# Patient Record
Sex: Male | Born: 1950 | Race: White | Hispanic: No | Marital: Married | Smoking: Former smoker
Health system: Southern US, Community
[De-identification: ages and names within clinical notes are randomized; demographics above are authoritative.]

## PROBLEM LIST (undated history)

## (undated) DIAGNOSIS — M549 Dorsalgia, unspecified: Secondary | ICD-10-CM

## (undated) DIAGNOSIS — N200 Calculus of kidney: Secondary | ICD-10-CM

## (undated) DIAGNOSIS — Z95810 Presence of automatic (implantable) cardiac defibrillator: Secondary | ICD-10-CM

## (undated) DIAGNOSIS — Z950 Presence of cardiac pacemaker: Secondary | ICD-10-CM

## (undated) DIAGNOSIS — D45 Polycythemia vera: Secondary | ICD-10-CM

## (undated) DIAGNOSIS — I429 Cardiomyopathy, unspecified: Secondary | ICD-10-CM

## (undated) DIAGNOSIS — Q549 Hypospadias, unspecified: Secondary | ICD-10-CM

## (undated) DIAGNOSIS — M1990 Unspecified osteoarthritis, unspecified site: Secondary | ICD-10-CM

## (undated) DIAGNOSIS — M109 Gout, unspecified: Secondary | ICD-10-CM

## (undated) DIAGNOSIS — E119 Type 2 diabetes mellitus without complications: Secondary | ICD-10-CM

## (undated) DIAGNOSIS — Z87442 Personal history of urinary calculi: Secondary | ICD-10-CM

## (undated) DIAGNOSIS — I509 Heart failure, unspecified: Secondary | ICD-10-CM

## (undated) DIAGNOSIS — I10 Essential (primary) hypertension: Secondary | ICD-10-CM

## (undated) DIAGNOSIS — I441 Atrioventricular block, second degree: Secondary | ICD-10-CM

## (undated) HISTORY — DX: Gout, unspecified: M10.9

## (undated) HISTORY — PX: TONSILLECTOMY: SUR1361

## (undated) HISTORY — PX: PILONIDAL CYST EXCISION: SHX744

## (undated) HISTORY — DX: Polycythemia vera: D45

## (undated) HISTORY — DX: Cardiomyopathy, unspecified: I42.9

## (undated) HISTORY — DX: Atrioventricular block, second degree: I44.1

## (undated) HISTORY — PX: CARDIAC SURGERY: SHX584

## (undated) HISTORY — DX: Calculus of kidney: N20.0

## (undated) HISTORY — DX: Hemochromatosis, unspecified: E83.119

## (undated) HISTORY — PX: POSTERIOR LAMINECTOMY / DECOMPRESSION LUMBAR SPINE: SUR740

## (undated) HISTORY — DX: Dorsalgia, unspecified: M54.9

## (undated) HISTORY — PX: ANKLE SURGERY: SHX546

## (undated) HISTORY — DX: Essential (primary) hypertension: I10

---
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
## 2010-04-11 ENCOUNTER — Ambulatory Visit: Payer: Self-pay | Admitting: Hematology & Oncology

## 2010-04-18 LAB — CBC WITH DIFFERENTIAL (CANCER CENTER ONLY)
HCT: 58.8 % — ABNORMAL HIGH (ref 38.7–49.9)
HGB: 19.1 g/dL — ABNORMAL HIGH (ref 13.0–17.1)
MCH: 32 pg (ref 28.0–33.4)
MCHC: 32.5 g/dL (ref 32.0–35.9)
MCV: 99 fL — ABNORMAL HIGH (ref 82–98)
Platelets: 610 10*3/uL — ABNORMAL HIGH (ref 145–400)
RBC: 5.96 10*6/uL — ABNORMAL HIGH (ref 4.20–5.70)
RDW: 11.3 % (ref 10.5–14.6)
WBC: 11.3 10*3/uL — ABNORMAL HIGH (ref 4.0–10.0)

## 2010-04-18 LAB — MANUAL DIFFERENTIAL (CHCC SATELLITE)
ALC: 2.8 10*3/uL (ref 0.9–3.3)
ANC (CHCC HP manual diff): 7.7 10*3/uL — ABNORMAL HIGH (ref 1.5–6.5)
BASO: 2 % (ref 0–2)
Eos: 6 % (ref 0–7)
LYMPH: 25 % (ref 14–48)
MONO: 7 % (ref 0–13)
PLT EST ~~LOC~~: INCREASED
RBC Comments: NORMAL
SEG: 68 % (ref 40–75)

## 2010-04-18 LAB — CHCC SATELLITE - SMEAR

## 2010-04-22 LAB — COMPREHENSIVE METABOLIC PANEL
ALT: 40 U/L (ref 0–53)
AST: 32 U/L (ref 0–37)
Albumin: 4.3 g/dL (ref 3.5–5.2)
Alkaline Phosphatase: 88 U/L (ref 39–117)
BUN: 18 mg/dL (ref 6–23)
CO2: 21 mEq/L (ref 19–32)
Calcium: 9.9 mg/dL (ref 8.4–10.5)
Chloride: 105 mEq/L (ref 96–112)
Creatinine, Ser: 1 mg/dL (ref 0.40–1.50)
Glucose, Bld: 90 mg/dL (ref 70–99)
Potassium: 4.6 mEq/L (ref 3.5–5.3)
Sodium: 136 mEq/L (ref 135–145)
Total Bilirubin: 0.5 mg/dL (ref 0.3–1.2)
Total Protein: 7 g/dL (ref 6.0–8.3)

## 2010-04-22 LAB — JAK2 GENOTYPR: JAK2 GenotypR: DETECTED

## 2010-04-22 LAB — VITAMIN B12: Vitamin B-12: 434 pg/mL (ref 211–911)

## 2010-04-22 LAB — URIC ACID: Uric Acid, Serum: 3.8 mg/dL — ABNORMAL LOW (ref 4.0–7.8)

## 2010-04-22 LAB — LACTATE DEHYDROGENASE: LDH: 193 U/L (ref 94–250)

## 2010-04-22 LAB — FERRITIN: Ferritin: 30 ng/mL (ref 22–322)

## 2010-04-22 LAB — ERYTHROPOIETIN: Erythropoietin: 6.4 m[IU]/mL (ref 2.6–34.0)

## 2010-04-25 LAB — CBC WITH DIFFERENTIAL (CANCER CENTER ONLY)
HCT: 56.7 % — ABNORMAL HIGH (ref 38.7–49.9)
HGB: 18.8 g/dL — ABNORMAL HIGH (ref 13.0–17.1)
MCH: 32.4 pg (ref 28.0–33.4)
MCHC: 33.1 g/dL (ref 32.0–35.9)
MCV: 98 fL (ref 82–98)
Platelets: 530 10*3/uL — ABNORMAL HIGH (ref 145–400)
RBC: 5.79 10*6/uL — ABNORMAL HIGH (ref 4.20–5.70)
RDW: 11.6 % (ref 10.5–14.6)
WBC: 10.3 10*3/uL — ABNORMAL HIGH (ref 4.0–10.0)

## 2010-04-25 LAB — MANUAL DIFFERENTIAL (CHCC SATELLITE)
ALC: 3.7 10*3/uL — ABNORMAL HIGH (ref 0.9–3.3)
ANC (CHCC HP manual diff): 5.8 10*3/uL (ref 1.5–6.5)
BASO: 1 % (ref 0–2)
Eos: 3 % (ref 0–7)
LYMPH: 36 % (ref 14–48)
MONO: 4 % (ref 0–13)
PLT EST ~~LOC~~: INCREASED
RBC Comments: NORMAL
SEG: 56 % (ref 40–75)

## 2010-05-03 LAB — CBC WITH DIFFERENTIAL (CANCER CENTER ONLY)
BASO#: 0.2 10*3/uL (ref 0.0–0.2)
BASO%: 1.8 % (ref 0.0–2.0)
EOS%: 6 % (ref 0.0–7.0)
Eosinophils Absolute: 0.6 10*3/uL — ABNORMAL HIGH (ref 0.0–0.5)
HCT: 53.4 % — ABNORMAL HIGH (ref 38.7–49.9)
HGB: 17.9 g/dL — ABNORMAL HIGH (ref 13.0–17.1)
LYMPH#: 2.9 10*3/uL (ref 0.9–3.3)
LYMPH%: 31 % (ref 14.0–48.0)
MCH: 32.7 pg (ref 28.0–33.4)
MCHC: 33.5 g/dL (ref 32.0–35.9)
MCV: 98 fL (ref 82–98)
MONO#: 0.8 10*3/uL (ref 0.1–0.9)
MONO%: 8.3 % (ref 0.0–13.0)
NEUT#: 5 10*3/uL (ref 1.5–6.5)
NEUT%: 52.9 % (ref 40.0–80.0)
Platelets: 574 10*3/uL — ABNORMAL HIGH (ref 145–400)
RBC: 5.47 10*6/uL (ref 4.20–5.70)
RDW: 11.2 % (ref 10.5–14.6)
WBC: 9.5 10*3/uL (ref 4.0–10.0)

## 2010-05-10 ENCOUNTER — Ambulatory Visit (HOSPITAL_BASED_OUTPATIENT_CLINIC_OR_DEPARTMENT_OTHER): Payer: 59 | Admitting: Hematology & Oncology

## 2010-05-10 LAB — CHCC SATELLITE - SMEAR

## 2010-05-10 LAB — CBC WITH DIFFERENTIAL (CANCER CENTER ONLY)
BASO#: 0.2 10*3/uL (ref 0.0–0.2)
BASO%: 2.2 % — ABNORMAL HIGH (ref 0.0–2.0)
EOS%: 4.7 % (ref 0.0–7.0)
Eosinophils Absolute: 0.5 10*3/uL (ref 0.0–0.5)
HCT: 52.9 % — ABNORMAL HIGH (ref 38.7–49.9)
HGB: 17.3 g/dL — ABNORMAL HIGH (ref 13.0–17.1)
LYMPH#: 3 10*3/uL (ref 0.9–3.3)
LYMPH%: 28.9 % (ref 14.0–48.0)
MCH: 31.7 pg (ref 28.0–33.4)
MCHC: 32.7 g/dL (ref 32.0–35.9)
MCV: 97 fL (ref 82–98)
MONO#: 0.7 10*3/uL (ref 0.1–0.9)
MONO%: 6.5 % (ref 0.0–13.0)
NEUT#: 5.9 10*3/uL (ref 1.5–6.5)
NEUT%: 57.7 % (ref 40.0–80.0)
Platelets: 661 10*3/uL — ABNORMAL HIGH (ref 145–400)
RBC: 5.47 10*6/uL (ref 4.20–5.70)
RDW: 11.2 % (ref 10.5–14.6)
WBC: 10.2 10*3/uL — ABNORMAL HIGH (ref 4.0–10.0)

## 2010-05-10 LAB — FERRITIN: Ferritin: 17 ng/mL — ABNORMAL LOW (ref 22–322)

## 2010-05-23 LAB — CBC WITH DIFFERENTIAL (CANCER CENTER ONLY)
BASO#: 0.1 10*3/uL (ref 0.0–0.2)
BASO%: 0.9 % (ref 0.0–2.0)
EOS%: 4.4 % (ref 0.0–7.0)
Eosinophils Absolute: 0.4 10*3/uL (ref 0.0–0.5)
HCT: 49.1 % (ref 38.7–49.9)
HGB: 16.1 g/dL (ref 13.0–17.1)
LYMPH#: 2.5 10*3/uL (ref 0.9–3.3)
LYMPH%: 27 % (ref 14.0–48.0)
MCH: 30.8 pg (ref 28.0–33.4)
MCHC: 32.7 g/dL (ref 32.0–35.9)
MCV: 94 fL (ref 82–98)
MONO#: 0.6 10*3/uL (ref 0.1–0.9)
MONO%: 5.9 % (ref 0.0–13.0)
NEUT#: 5.8 10*3/uL (ref 1.5–6.5)
NEUT%: 61.8 % (ref 40.0–80.0)
Platelets: 676 10*3/uL — ABNORMAL HIGH (ref 145–400)
RBC: 5.21 10*6/uL (ref 4.20–5.70)
RDW: 11.7 % (ref 10.5–14.6)
WBC: 9.4 10*3/uL (ref 4.0–10.0)

## 2010-05-23 LAB — TECHNOLOGIST REVIEW CHCC SATELLITE

## 2010-05-23 LAB — FERRITIN: Ferritin: 10 ng/mL — ABNORMAL LOW (ref 22–322)

## 2010-06-06 LAB — CBC WITH DIFFERENTIAL (CANCER CENTER ONLY)
BASO#: 0.1 10*3/uL (ref 0.0–0.2)
BASO%: 1.1 % (ref 0.0–2.0)
EOS%: 4.8 % (ref 0.0–7.0)
Eosinophils Absolute: 0.6 10*3/uL — ABNORMAL HIGH (ref 0.0–0.5)
HCT: 44.9 % (ref 38.7–49.9)
HGB: 14.7 g/dL (ref 13.0–17.1)
LYMPH#: 3.1 10*3/uL (ref 0.9–3.3)
LYMPH%: 26.1 % (ref 14.0–48.0)
MCH: 29.9 pg (ref 28.0–33.4)
MCHC: 32.8 g/dL (ref 32.0–35.9)
MCV: 91 fL (ref 82–98)
MONO#: 0.9 10*3/uL (ref 0.1–0.9)
MONO%: 7.4 % (ref 0.0–13.0)
NEUT#: 7.2 10*3/uL — ABNORMAL HIGH (ref 1.5–6.5)
NEUT%: 60.6 % (ref 40.0–80.0)
Platelets: 695 10*3/uL — ABNORMAL HIGH (ref 145–400)
RBC: 4.92 10*6/uL (ref 4.20–5.70)
RDW: 11.8 % (ref 10.5–14.6)
WBC: 11.9 10*3/uL — ABNORMAL HIGH (ref 4.0–10.0)

## 2010-06-06 LAB — TECHNOLOGIST REVIEW CHCC SATELLITE

## 2010-06-06 LAB — FERRITIN: Ferritin: 9 ng/mL — ABNORMAL LOW (ref 22–322)

## 2010-06-10 ENCOUNTER — Encounter: Payer: Self-pay | Admitting: Hematology & Oncology

## 2010-06-10 DIAGNOSIS — D45 Polycythemia vera: Secondary | ICD-10-CM

## 2010-06-10 LAB — CBC WITH DIFFERENTIAL (CANCER CENTER ONLY)
BASO#: 0.1 10*3/uL (ref 0.0–0.2)
BASO%: 0.7 % (ref 0.0–2.0)
EOS%: 3.7 % (ref 0.0–7.0)
Eosinophils Absolute: 0.3 10*3/uL (ref 0.0–0.5)
HCT: 42 % (ref 38.7–49.9)
HGB: 13.7 g/dL (ref 13.0–17.1)
LYMPH#: 3.1 10*3/uL (ref 0.9–3.3)
LYMPH%: 33.2 % (ref 14.0–48.0)
MCH: 29.7 pg (ref 28.0–33.4)
MCHC: 32.8 g/dL (ref 32.0–35.9)
MCV: 91 fL (ref 82–98)
MONO#: 1.1 10*3/uL — ABNORMAL HIGH (ref 0.1–0.9)
MONO%: 11.5 % (ref 0.0–13.0)
NEUT#: 4.7 10*3/uL (ref 1.5–6.5)
NEUT%: 50.9 % (ref 40.0–80.0)
Platelets: 680 10*3/uL — ABNORMAL HIGH (ref 145–400)
RBC: 4.63 10*6/uL (ref 4.20–5.70)
RDW: 11.2 % (ref 10.5–14.6)
WBC: 9.3 10*3/uL (ref 4.0–10.0)

## 2010-06-24 ENCOUNTER — Encounter (HOSPITAL_BASED_OUTPATIENT_CLINIC_OR_DEPARTMENT_OTHER): Payer: 59 | Admitting: Hematology & Oncology

## 2010-06-24 ENCOUNTER — Other Ambulatory Visit: Payer: Self-pay | Admitting: Family

## 2010-06-24 DIAGNOSIS — I10 Essential (primary) hypertension: Secondary | ICD-10-CM

## 2010-06-24 DIAGNOSIS — D45 Polycythemia vera: Secondary | ICD-10-CM

## 2010-06-24 LAB — CBC WITH DIFFERENTIAL (CANCER CENTER ONLY)
BASO#: 0.1 10*3/uL (ref 0.0–0.2)
BASO%: 0.9 % (ref 0.0–2.0)
EOS%: 3.4 % (ref 0.0–7.0)
Eosinophils Absolute: 0.3 10*3/uL (ref 0.0–0.5)
HCT: 41.6 % (ref 38.7–49.9)
HGB: 13.7 g/dL (ref 13.0–17.1)
LYMPH#: 2.7 10*3/uL (ref 0.9–3.3)
LYMPH%: 34.4 % (ref 14.0–48.0)
MCH: 29.6 pg (ref 28.0–33.4)
MCHC: 32.9 g/dL (ref 32.0–35.9)
MCV: 90 fL (ref 82–98)
MONO#: 0.9 10*3/uL (ref 0.1–0.9)
MONO%: 11 % (ref 0.0–13.0)
NEUT#: 3.9 10*3/uL (ref 1.5–6.5)
NEUT%: 50.3 % (ref 40.0–80.0)
Platelets: 336 10*3/uL (ref 145–400)
RBC: 4.62 10*6/uL (ref 4.20–5.70)
RDW: 11.8 % (ref 10.5–14.6)
WBC: 7.7 10*3/uL (ref 4.0–10.0)

## 2010-06-24 LAB — FERRITIN: Ferritin: 10 ng/mL — ABNORMAL LOW (ref 22–322)

## 2010-07-14 ENCOUNTER — Other Ambulatory Visit: Payer: Self-pay | Admitting: Hematology & Oncology

## 2010-07-14 ENCOUNTER — Encounter (HOSPITAL_BASED_OUTPATIENT_CLINIC_OR_DEPARTMENT_OTHER): Payer: 59 | Admitting: Hematology & Oncology

## 2010-07-14 DIAGNOSIS — R209 Unspecified disturbances of skin sensation: Secondary | ICD-10-CM

## 2010-07-14 DIAGNOSIS — I10 Essential (primary) hypertension: Secondary | ICD-10-CM

## 2010-07-14 DIAGNOSIS — D45 Polycythemia vera: Secondary | ICD-10-CM

## 2010-07-14 LAB — CBC WITH DIFFERENTIAL (CANCER CENTER ONLY)
BASO#: 0.2 10*3/uL (ref 0.0–0.2)
BASO%: 2.4 % — ABNORMAL HIGH (ref 0.0–2.0)
EOS%: 2.2 % (ref 0.0–7.0)
Eosinophils Absolute: 0.2 10*3/uL (ref 0.0–0.5)
HCT: 41.7 % (ref 38.7–49.9)
HGB: 13.4 g/dL (ref 13.0–17.1)
LYMPH#: 2.9 10*3/uL (ref 0.9–3.3)
LYMPH%: 31.7 % (ref 14.0–48.0)
MCH: 28.3 pg (ref 28.0–33.4)
MCHC: 32.1 g/dL (ref 32.0–35.9)
MCV: 88 fL (ref 82–98)
MONO#: 1.4 10*3/uL — ABNORMAL HIGH (ref 0.1–0.9)
MONO%: 15.4 % — ABNORMAL HIGH (ref 0.0–13.0)
NEUT#: 4.4 10*3/uL (ref 1.5–6.5)
NEUT%: 48.3 % (ref 40.0–80.0)
Platelets: 538 10*3/uL — ABNORMAL HIGH (ref 145–400)
RBC: 4.73 10*6/uL (ref 4.20–5.70)
RDW: 15.5 % (ref 11.1–15.7)
WBC: 9.1 10*3/uL (ref 4.0–10.0)

## 2010-08-12 ENCOUNTER — Encounter (HOSPITAL_BASED_OUTPATIENT_CLINIC_OR_DEPARTMENT_OTHER): Payer: 59 | Admitting: Hematology & Oncology

## 2010-08-12 ENCOUNTER — Other Ambulatory Visit: Payer: Self-pay | Admitting: Family

## 2010-08-12 DIAGNOSIS — D45 Polycythemia vera: Secondary | ICD-10-CM

## 2010-08-12 DIAGNOSIS — R209 Unspecified disturbances of skin sensation: Secondary | ICD-10-CM

## 2010-08-12 DIAGNOSIS — I10 Essential (primary) hypertension: Secondary | ICD-10-CM

## 2010-08-12 DIAGNOSIS — Z7982 Long term (current) use of aspirin: Secondary | ICD-10-CM

## 2010-08-12 LAB — CBC WITH DIFFERENTIAL (CANCER CENTER ONLY)
BASO#: 0.2 10*3/uL (ref 0.0–0.2)
BASO%: 2.2 % — ABNORMAL HIGH (ref 0.0–2.0)
EOS%: 2.6 % (ref 0.0–7.0)
Eosinophils Absolute: 0.2 10*3/uL (ref 0.0–0.5)
HCT: 44.5 % (ref 38.7–49.9)
HGB: 14.5 g/dL (ref 13.0–17.1)
LYMPH#: 2.4 10*3/uL (ref 0.9–3.3)
LYMPH%: 32 % (ref 14.0–48.0)
MCH: 28.8 pg (ref 28.0–33.4)
MCHC: 32.6 g/dL (ref 32.0–35.9)
MCV: 88 fL (ref 82–98)
MONO#: 0.6 10*3/uL (ref 0.1–0.9)
MONO%: 8.6 % (ref 0.0–13.0)
NEUT#: 4.1 10*3/uL (ref 1.5–6.5)
NEUT%: 54.6 % (ref 40.0–80.0)
Platelets: 405 10*3/uL — ABNORMAL HIGH (ref 145–400)
RBC: 5.04 10*6/uL (ref 4.20–5.70)
RDW: 20.6 % — ABNORMAL HIGH (ref 11.1–15.7)
WBC: 7.4 10*3/uL (ref 4.0–10.0)

## 2010-09-16 ENCOUNTER — Other Ambulatory Visit: Payer: Self-pay | Admitting: Hematology & Oncology

## 2010-09-16 ENCOUNTER — Encounter (HOSPITAL_BASED_OUTPATIENT_CLINIC_OR_DEPARTMENT_OTHER): Payer: 59 | Admitting: Hematology & Oncology

## 2010-09-16 DIAGNOSIS — R209 Unspecified disturbances of skin sensation: Secondary | ICD-10-CM

## 2010-09-16 DIAGNOSIS — Z7982 Long term (current) use of aspirin: Secondary | ICD-10-CM

## 2010-09-16 DIAGNOSIS — D45 Polycythemia vera: Secondary | ICD-10-CM

## 2010-09-16 DIAGNOSIS — I10 Essential (primary) hypertension: Secondary | ICD-10-CM

## 2010-09-16 LAB — CHCC SATELLITE - SMEAR

## 2010-09-16 LAB — CBC WITH DIFFERENTIAL (CANCER CENTER ONLY)
BASO#: 0.1 10*3/uL (ref 0.0–0.2)
BASO%: 0.7 % (ref 0.0–2.0)
EOS%: 2.6 % (ref 0.0–7.0)
Eosinophils Absolute: 0.2 10*3/uL (ref 0.0–0.5)
HCT: 41 % (ref 38.7–49.9)
HGB: 13.5 g/dL (ref 13.0–17.1)
LYMPH#: 2.5 10*3/uL (ref 0.9–3.3)
LYMPH%: 37.1 % (ref 14.0–48.0)
MCH: 31.6 pg (ref 28.0–33.4)
MCHC: 32.9 g/dL (ref 32.0–35.9)
MCV: 96 fL (ref 82–98)
MONO#: 0.8 10*3/uL (ref 0.1–0.9)
MONO%: 12 % (ref 0.0–13.0)
NEUT#: 3.2 10*3/uL (ref 1.5–6.5)
NEUT%: 47.6 % (ref 40.0–80.0)
Platelets: 125 10*3/uL — ABNORMAL LOW (ref 145–400)
RBC: 4.27 10*6/uL (ref 4.20–5.70)
RDW: 25.1 % — ABNORMAL HIGH (ref 11.1–15.7)
WBC: 6.8 10*3/uL (ref 4.0–10.0)

## 2010-09-17 LAB — COMPREHENSIVE METABOLIC PANEL
ALT: 32 U/L (ref 0–53)
AST: 25 U/L (ref 0–37)
Albumin: 4.1 g/dL (ref 3.5–5.2)
Alkaline Phosphatase: 74 U/L (ref 39–117)
BUN: 16 mg/dL (ref 6–23)
CO2: 22 mEq/L (ref 19–32)
Calcium: 8.6 mg/dL (ref 8.4–10.5)
Chloride: 105 mEq/L (ref 96–112)
Creatinine, Ser: 1.16 mg/dL (ref 0.40–1.50)
Glucose, Bld: 92 mg/dL (ref 70–99)
Potassium: 4.2 mEq/L (ref 3.5–5.3)
Sodium: 139 mEq/L (ref 135–145)
Total Bilirubin: 0.4 mg/dL (ref 0.3–1.2)
Total Protein: 6.5 g/dL (ref 6.0–8.3)

## 2010-09-17 LAB — RETICULOCYTES (CHCC)
ABS Retic: 43.5 10*3/uL (ref 19.0–186.0)
RBC.: 4.35 MIL/uL (ref 4.22–5.81)
Retic Ct Pct: 1 % (ref 0.4–3.1)

## 2010-09-17 LAB — TSH: TSH: 0.264 u[IU]/mL — ABNORMAL LOW (ref 0.350–4.500)

## 2010-09-17 LAB — VITAMIN D 25 HYDROXY (VIT D DEFICIENCY, FRACTURES): Vit D, 25-Hydroxy: 33 ng/mL (ref 30–89)

## 2010-09-17 LAB — FERRITIN: Ferritin: 16 ng/mL — ABNORMAL LOW (ref 22–322)

## 2010-09-17 LAB — VITAMIN B12: Vitamin B-12: 404 pg/mL (ref 211–911)

## 2010-09-20 LAB — FOLATE RBC: RBC Folate: 697 ng/mL (ref >=366)

## 2010-10-21 ENCOUNTER — Other Ambulatory Visit: Payer: Self-pay | Admitting: Family

## 2010-10-21 ENCOUNTER — Encounter (HOSPITAL_BASED_OUTPATIENT_CLINIC_OR_DEPARTMENT_OTHER): Payer: 59 | Admitting: Hematology & Oncology

## 2010-10-21 DIAGNOSIS — I10 Essential (primary) hypertension: Secondary | ICD-10-CM

## 2010-10-21 DIAGNOSIS — D45 Polycythemia vera: Secondary | ICD-10-CM

## 2010-10-21 DIAGNOSIS — R209 Unspecified disturbances of skin sensation: Secondary | ICD-10-CM

## 2010-10-21 DIAGNOSIS — Z7982 Long term (current) use of aspirin: Secondary | ICD-10-CM

## 2010-10-21 LAB — CBC WITH DIFFERENTIAL (CANCER CENTER ONLY)
BASO#: 0.1 10*3/uL (ref 0.0–0.2)
BASO%: 1.5 % (ref 0.0–2.0)
EOS%: 3.8 % (ref 0.0–7.0)
Eosinophils Absolute: 0.3 10*3/uL (ref 0.0–0.5)
HCT: 43.7 % (ref 38.7–49.9)
HGB: 14.2 g/dL (ref 13.0–17.1)
LYMPH#: 2.8 10*3/uL (ref 0.9–3.3)
LYMPH%: 35.7 % (ref 14.0–48.0)
MCH: 32 pg (ref 28.0–33.4)
MCHC: 32.5 g/dL (ref 32.0–35.9)
MCV: 98 fL (ref 82–98)
MONO#: 1.1 10*3/uL — ABNORMAL HIGH (ref 0.1–0.9)
MONO%: 13.5 % — ABNORMAL HIGH (ref 0.0–13.0)
NEUT#: 3.6 10*3/uL (ref 1.5–6.5)
NEUT%: 45.5 % (ref 40.0–80.0)
Platelets: 416 10*3/uL — ABNORMAL HIGH (ref 145–400)
RBC: 4.44 10*6/uL (ref 4.20–5.70)
RDW: 19.1 % — ABNORMAL HIGH (ref 11.1–15.7)
WBC: 7.9 10*3/uL (ref 4.0–10.0)

## 2010-12-06 ENCOUNTER — Encounter (HOSPITAL_BASED_OUTPATIENT_CLINIC_OR_DEPARTMENT_OTHER): Payer: 59 | Admitting: Hematology & Oncology

## 2010-12-06 ENCOUNTER — Other Ambulatory Visit: Payer: Self-pay | Admitting: Family

## 2010-12-06 DIAGNOSIS — I10 Essential (primary) hypertension: Secondary | ICD-10-CM

## 2010-12-06 DIAGNOSIS — R209 Unspecified disturbances of skin sensation: Secondary | ICD-10-CM

## 2010-12-06 DIAGNOSIS — D45 Polycythemia vera: Secondary | ICD-10-CM

## 2010-12-06 DIAGNOSIS — Z7982 Long term (current) use of aspirin: Secondary | ICD-10-CM

## 2010-12-06 LAB — CBC WITH DIFFERENTIAL (CANCER CENTER ONLY)
BASO#: 0.2 10*3/uL (ref 0.0–0.2)
BASO%: 1.9 % (ref 0.0–2.0)
EOS%: 3.3 % (ref 0.0–7.0)
Eosinophils Absolute: 0.3 10*3/uL (ref 0.0–0.5)
HCT: 46 % (ref 38.7–49.9)
HGB: 15.5 g/dL (ref 13.0–17.1)
LYMPH#: 2.8 10*3/uL (ref 0.9–3.3)
LYMPH%: 28 % (ref 14.0–48.0)
MCH: 31.2 pg (ref 28.0–33.4)
MCHC: 33.7 g/dL (ref 32.0–35.9)
MCV: 93 fL (ref 82–98)
MONO#: 0.9 10*3/uL (ref 0.1–0.9)
MONO%: 9 % (ref 0.0–13.0)
NEUT#: 5.8 10*3/uL (ref 1.5–6.5)
NEUT%: 57.8 % (ref 40.0–80.0)
Platelets: 570 10*3/uL — ABNORMAL HIGH (ref 145–400)
RBC: 4.97 10*6/uL (ref 4.20–5.70)
RDW: 15.5 % (ref 11.1–15.7)
WBC: 9.9 10*3/uL (ref 4.0–10.0)

## 2010-12-06 LAB — TECHNOLOGIST REVIEW CHCC SATELLITE

## 2010-12-19 ENCOUNTER — Other Ambulatory Visit: Payer: Self-pay | Admitting: Hematology & Oncology

## 2010-12-19 ENCOUNTER — Encounter (HOSPITAL_BASED_OUTPATIENT_CLINIC_OR_DEPARTMENT_OTHER): Payer: 59 | Admitting: Hematology & Oncology

## 2010-12-19 DIAGNOSIS — Z7982 Long term (current) use of aspirin: Secondary | ICD-10-CM

## 2010-12-19 DIAGNOSIS — I10 Essential (primary) hypertension: Secondary | ICD-10-CM

## 2010-12-19 DIAGNOSIS — R209 Unspecified disturbances of skin sensation: Secondary | ICD-10-CM

## 2010-12-19 DIAGNOSIS — D45 Polycythemia vera: Secondary | ICD-10-CM

## 2010-12-19 LAB — RETICULOCYTES (CHCC)
ABS Retic: 93.3 10*3/uL (ref 19.0–186.0)
RBC.: 4.91 MIL/uL (ref 4.22–5.81)
Retic Ct Pct: 1.9 % (ref 0.4–2.3)

## 2010-12-19 LAB — CBC WITH DIFFERENTIAL (CANCER CENTER ONLY)
BASO#: 0.2 10*3/uL (ref 0.0–0.2)
BASO%: 1.8 % (ref 0.0–2.0)
EOS%: 4.4 % (ref 0.0–7.0)
Eosinophils Absolute: 0.5 10*3/uL (ref 0.0–0.5)
HCT: 43.8 % (ref 38.7–49.9)
HGB: 14.6 g/dL (ref 13.0–17.1)
LYMPH#: 2.8 10*3/uL (ref 0.9–3.3)
LYMPH%: 26 % (ref 14.0–48.0)
MCH: 30.3 pg (ref 28.0–33.4)
MCHC: 33.3 g/dL (ref 32.0–35.9)
MCV: 91 fL (ref 82–98)
MONO#: 0.9 10*3/uL (ref 0.1–0.9)
MONO%: 8.4 % (ref 0.0–13.0)
NEUT#: 6.4 10*3/uL (ref 1.5–6.5)
NEUT%: 59.4 % (ref 40.0–80.0)
Platelets: 585 10*3/uL — ABNORMAL HIGH (ref 145–400)
RBC: 4.82 10*6/uL (ref 4.20–5.70)
RDW: 15.2 % (ref 11.1–15.7)
WBC: 10.8 10*3/uL — ABNORMAL HIGH (ref 4.0–10.0)

## 2010-12-19 LAB — FERRITIN: Ferritin: 10 ng/mL — ABNORMAL LOW (ref 22–322)

## 2010-12-19 LAB — CHCC SATELLITE - SMEAR

## 2011-01-20 ENCOUNTER — Other Ambulatory Visit: Payer: Self-pay

## 2011-01-20 ENCOUNTER — Other Ambulatory Visit: Payer: Self-pay | Admitting: Hematology & Oncology

## 2011-01-20 ENCOUNTER — Encounter (HOSPITAL_BASED_OUTPATIENT_CLINIC_OR_DEPARTMENT_OTHER): Payer: 59 | Admitting: Hematology & Oncology

## 2011-01-20 DIAGNOSIS — D473 Essential (hemorrhagic) thrombocythemia: Secondary | ICD-10-CM

## 2011-01-20 DIAGNOSIS — D45 Polycythemia vera: Secondary | ICD-10-CM

## 2011-01-20 DIAGNOSIS — R0789 Other chest pain: Secondary | ICD-10-CM

## 2011-01-20 LAB — CBC WITH DIFFERENTIAL (CANCER CENTER ONLY)
BASO#: 0.1 10*3/uL (ref 0.0–0.2)
BASO%: 1.1 % (ref 0.0–2.0)
EOS%: 5.2 % (ref 0.0–7.0)
Eosinophils Absolute: 0.6 10*3/uL — ABNORMAL HIGH (ref 0.0–0.5)
HCT: 45.3 % (ref 38.7–49.9)
HGB: 14.9 g/dL (ref 13.0–17.1)
LYMPH#: 2.1 10*3/uL (ref 0.9–3.3)
LYMPH%: 19.7 % (ref 14.0–48.0)
MCH: 28 pg (ref 28.0–33.4)
MCHC: 32.9 g/dL (ref 32.0–35.9)
MCV: 85 fL (ref 82–98)
MONO#: 0.8 10*3/uL (ref 0.1–0.9)
MONO%: 7.5 % (ref 0.0–13.0)
NEUT#: 7 10*3/uL — ABNORMAL HIGH (ref 1.5–6.5)
NEUT%: 66.5 % (ref 40.0–80.0)
Platelets: 592 10*3/uL — ABNORMAL HIGH (ref 145–400)
RBC: 5.33 10*6/uL (ref 4.20–5.70)
RDW: 15 % (ref 11.1–15.7)
WBC: 10.6 10*3/uL — ABNORMAL HIGH (ref 4.0–10.0)

## 2011-03-27 ENCOUNTER — Ambulatory Visit (HOSPITAL_BASED_OUTPATIENT_CLINIC_OR_DEPARTMENT_OTHER): Payer: 59 | Admitting: Hematology & Oncology

## 2011-03-27 ENCOUNTER — Encounter: Payer: Self-pay | Admitting: Hematology & Oncology

## 2011-03-27 ENCOUNTER — Ambulatory Visit: Payer: 59

## 2011-03-27 ENCOUNTER — Other Ambulatory Visit (HOSPITAL_BASED_OUTPATIENT_CLINIC_OR_DEPARTMENT_OTHER): Payer: 59 | Admitting: Lab

## 2011-03-27 ENCOUNTER — Other Ambulatory Visit: Payer: Self-pay | Admitting: Family

## 2011-03-27 DIAGNOSIS — D473 Essential (hemorrhagic) thrombocythemia: Secondary | ICD-10-CM

## 2011-03-27 DIAGNOSIS — D509 Iron deficiency anemia, unspecified: Secondary | ICD-10-CM

## 2011-03-27 DIAGNOSIS — D45 Polycythemia vera: Secondary | ICD-10-CM | POA: Insufficient documentation

## 2011-03-27 DIAGNOSIS — D751 Secondary polycythemia: Secondary | ICD-10-CM

## 2011-03-27 LAB — CBC WITH DIFFERENTIAL (CANCER CENTER ONLY)
BASO#: 0.2 10*3/uL (ref 0.0–0.2)
BASO%: 1.9 % (ref 0.0–2.0)
EOS%: 2.9 % (ref 0.0–7.0)
Eosinophils Absolute: 0.3 10*3/uL (ref 0.0–0.5)
HCT: 43.1 % (ref 38.7–49.9)
HGB: 14 g/dL (ref 13.0–17.1)
LYMPH#: 2.6 10*3/uL (ref 0.9–3.3)
LYMPH%: 24.6 % (ref 14.0–48.0)
MCH: 25.5 pg — ABNORMAL LOW (ref 28.0–33.4)
MCHC: 32.5 g/dL (ref 32.0–35.9)
MCV: 79 fL — ABNORMAL LOW (ref 82–98)
MONO#: 1.2 10*3/uL — ABNORMAL HIGH (ref 0.1–0.9)
MONO%: 11.3 % (ref 0.0–13.0)
NEUT#: 6.2 10*3/uL (ref 1.5–6.5)
NEUT%: 59.3 % (ref 40.0–80.0)
Platelets: 665 10*3/uL — ABNORMAL HIGH (ref 145–400)
RBC: 5.49 10*6/uL (ref 4.20–5.70)
RDW: 17.5 % — ABNORMAL HIGH (ref 11.1–15.7)
WBC: 10.5 10*3/uL — ABNORMAL HIGH (ref 4.0–10.0)

## 2011-03-27 NOTE — Progress Notes (Signed)
This office note has been dictated. CSN: [id number]
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
## 2011-04-27 ENCOUNTER — Telehealth: Payer: Self-pay | Admitting: *Deleted

## 2011-04-27 ENCOUNTER — Other Ambulatory Visit (HOSPITAL_BASED_OUTPATIENT_CLINIC_OR_DEPARTMENT_OTHER): Payer: 59 | Admitting: Lab

## 2011-04-27 ENCOUNTER — Other Ambulatory Visit: Payer: Self-pay | Admitting: *Deleted

## 2011-04-27 ENCOUNTER — Ambulatory Visit: Payer: 59

## 2011-04-27 ENCOUNTER — Other Ambulatory Visit: Payer: Self-pay | Admitting: Hematology & Oncology

## 2011-04-27 DIAGNOSIS — D45 Polycythemia vera: Secondary | ICD-10-CM

## 2011-04-27 LAB — CBC WITH DIFFERENTIAL (CANCER CENTER ONLY)
BASO#: 0.1 10*3/uL (ref 0.0–0.2)
BASO%: 1.3 % (ref 0.0–2.0)
EOS%: 5.6 % (ref 0.0–7.0)
Eosinophils Absolute: 0.6 10*3/uL — ABNORMAL HIGH (ref 0.0–0.5)
HCT: 46.8 % (ref 38.7–49.9)
HGB: 14.7 g/dL (ref 13.0–17.1)
LYMPH#: 2.5 10*3/uL (ref 0.9–3.3)
LYMPH%: 24.5 % (ref 14.0–48.0)
MCH: 24.9 pg — ABNORMAL LOW (ref 28.0–33.4)
MCHC: 31.4 g/dL — ABNORMAL LOW (ref 32.0–35.9)
MCV: 79 fL — ABNORMAL LOW (ref 82–98)
MONO#: 0.9 10*3/uL (ref 0.1–0.9)
MONO%: 9.3 % (ref 0.0–13.0)
NEUT#: 6 10*3/uL (ref 1.5–6.5)
NEUT%: 59.3 % (ref 40.0–80.0)
Platelets: 593 10*3/uL — ABNORMAL HIGH (ref 145–400)
RBC: 5.9 10*6/uL — ABNORMAL HIGH (ref 4.20–5.70)
RDW: 19.8 % — ABNORMAL HIGH (ref 11.1–15.7)
WBC: 10.1 10*3/uL — ABNORMAL HIGH (ref 4.0–10.0)

## 2011-04-27 NOTE — Telephone Encounter (Signed)
Pt called said he might need a phlebotomy. Per RN Pt has 12-20 lab appointment

## 2011-07-07 ENCOUNTER — Other Ambulatory Visit: Payer: Self-pay | Admitting: *Deleted

## 2011-07-07 DIAGNOSIS — D45 Polycythemia vera: Secondary | ICD-10-CM

## 2011-07-07 NOTE — Progress Notes (Signed)
Pt's wife called wanting to schedule a lab appt for the pt on Monday. CBC order entered & request sent to scheduling.

## 2011-07-10 ENCOUNTER — Other Ambulatory Visit (HOSPITAL_BASED_OUTPATIENT_CLINIC_OR_DEPARTMENT_OTHER): Payer: 59 | Admitting: Lab

## 2011-07-18 ENCOUNTER — Telehealth: Payer: Self-pay | Admitting: Hematology & Oncology

## 2011-07-18 NOTE — Telephone Encounter (Signed)
Pt called and resch 07/10/11 missed apt to 07/21/11.

## 2011-07-19 ENCOUNTER — Other Ambulatory Visit: Payer: 59 | Admitting: Lab

## 2011-07-19 ENCOUNTER — Ambulatory Visit (HOSPITAL_BASED_OUTPATIENT_CLINIC_OR_DEPARTMENT_OTHER): Payer: 59

## 2011-07-19 DIAGNOSIS — D45 Polycythemia vera: Secondary | ICD-10-CM

## 2011-07-19 LAB — CBC WITH DIFFERENTIAL (CANCER CENTER ONLY)
BASO#: 0.2 10*3/uL (ref 0.0–0.2)
BASO%: 1.4 % (ref 0.0–2.0)
EOS%: 3.4 % (ref 0.0–7.0)
Eosinophils Absolute: 0.4 10*3/uL (ref 0.0–0.5)
HCT: 45.6 % (ref 38.7–49.9)
HGB: 14.6 g/dL (ref 13.0–17.1)
LYMPH#: 3.8 10*3/uL — ABNORMAL HIGH (ref 0.9–3.3)
LYMPH%: 30 % (ref 14.0–48.0)
MCH: 25.3 pg — ABNORMAL LOW (ref 28.0–33.4)
MCHC: 32 g/dL (ref 32.0–35.9)
MCV: 79 fL — ABNORMAL LOW (ref 82–98)
MONO#: 1.7 10*3/uL — ABNORMAL HIGH (ref 0.1–0.9)
MONO%: 13.4 % — ABNORMAL HIGH (ref 0.0–13.0)
NEUT#: 6.5 10*3/uL (ref 1.5–6.5)
NEUT%: 51.8 % (ref 40.0–80.0)
Platelets: 607 10*3/uL — ABNORMAL HIGH (ref 145–400)
RBC: 5.77 10*6/uL — ABNORMAL HIGH (ref 4.20–5.70)
RDW: 18.7 % — ABNORMAL HIGH (ref 11.1–15.7)
WBC: 12.6 10*3/uL — ABNORMAL HIGH (ref 4.0–10.0)

## 2011-07-19 NOTE — Progress Notes (Signed)
Cannon Erbe presents today for phlebotomy per MD orders. Phlebotomy procedure started at 1510 and ended at 1525. 500 grams removed. Patient observed for 30 minutes after procedure without any incident. Patient tolerated procedure well. IV needle removed intact.

## 2011-07-21 ENCOUNTER — Other Ambulatory Visit: Payer: 59 | Admitting: Lab

## 2011-09-29 ENCOUNTER — Other Ambulatory Visit (HOSPITAL_BASED_OUTPATIENT_CLINIC_OR_DEPARTMENT_OTHER): Payer: 59 | Admitting: Lab

## 2011-09-29 ENCOUNTER — Ambulatory Visit (HOSPITAL_BASED_OUTPATIENT_CLINIC_OR_DEPARTMENT_OTHER): Payer: 59

## 2011-09-29 DIAGNOSIS — D751 Secondary polycythemia: Secondary | ICD-10-CM

## 2011-09-29 DIAGNOSIS — D45 Polycythemia vera: Secondary | ICD-10-CM

## 2011-09-29 DIAGNOSIS — D509 Iron deficiency anemia, unspecified: Secondary | ICD-10-CM

## 2011-09-29 LAB — CBC WITH DIFFERENTIAL (CANCER CENTER ONLY)
BASO#: 0.1 10*3/uL (ref 0.0–0.2)
BASO%: 1.2 % (ref 0.0–2.0)
EOS%: 2.5 % (ref 0.0–7.0)
Eosinophils Absolute: 0.3 10*3/uL (ref 0.0–0.5)
HCT: 45.1 % (ref 38.7–49.9)
HGB: 14.4 g/dL (ref 13.0–17.1)
LYMPH#: 2.7 10*3/uL (ref 0.9–3.3)
LYMPH%: 22.8 % (ref 14.0–48.0)
MCH: 25.6 pg — ABNORMAL LOW (ref 28.0–33.4)
MCHC: 31.9 g/dL — ABNORMAL LOW (ref 32.0–35.9)
MCV: 80 fL — ABNORMAL LOW (ref 82–98)
MONO#: 1.1 10*3/uL — ABNORMAL HIGH (ref 0.1–0.9)
MONO%: 9.4 % (ref 0.0–13.0)
NEUT#: 7.5 10*3/uL — ABNORMAL HIGH (ref 1.5–6.5)
NEUT%: 64.1 % (ref 40.0–80.0)
Platelets: 630 10*3/uL — ABNORMAL HIGH (ref 145–400)
RBC: 5.63 10*6/uL (ref 4.20–5.70)
RDW: 19.4 % — ABNORMAL HIGH (ref 11.1–15.7)
WBC: 11.6 10*3/uL — ABNORMAL HIGH (ref 4.0–10.0)

## 2011-09-29 NOTE — Progress Notes (Signed)
William Villanueva presents today for phlebotomy per MD orders. Phlebotomy procedure started at 1435 and ended at 1455. 500 grams removed. Patient observed for 30 minutes after procedure without any incident. Patient tolerated procedure well. IV needle removed intact.

## 2011-12-12 ENCOUNTER — Encounter: Payer: Self-pay | Admitting: Cardiology

## 2011-12-12 ENCOUNTER — Encounter: Payer: Self-pay | Admitting: *Deleted

## 2011-12-12 DIAGNOSIS — R21 Rash and other nonspecific skin eruption: Secondary | ICD-10-CM | POA: Insufficient documentation

## 2011-12-12 DIAGNOSIS — R0789 Other chest pain: Secondary | ICD-10-CM | POA: Insufficient documentation

## 2011-12-12 DIAGNOSIS — M109 Gout, unspecified: Secondary | ICD-10-CM | POA: Insufficient documentation

## 2011-12-12 DIAGNOSIS — M549 Dorsalgia, unspecified: Secondary | ICD-10-CM | POA: Insufficient documentation

## 2011-12-12 DIAGNOSIS — R0602 Shortness of breath: Secondary | ICD-10-CM | POA: Insufficient documentation

## 2011-12-13 ENCOUNTER — Ambulatory Visit (INDEPENDENT_AMBULATORY_CARE_PROVIDER_SITE_OTHER): Payer: 59 | Admitting: Cardiology

## 2011-12-13 ENCOUNTER — Encounter: Payer: Self-pay | Admitting: Cardiology

## 2011-12-13 VITALS — BP 138/82 | HR 70 | Ht 70.0 in | Wt 281.0 lb

## 2011-12-13 DIAGNOSIS — F17200 Nicotine dependence, unspecified, uncomplicated: Secondary | ICD-10-CM | POA: Insufficient documentation

## 2011-12-13 DIAGNOSIS — R0600 Dyspnea, unspecified: Secondary | ICD-10-CM

## 2011-12-13 DIAGNOSIS — R0602 Shortness of breath: Secondary | ICD-10-CM

## 2011-12-13 DIAGNOSIS — Z720 Tobacco use: Secondary | ICD-10-CM | POA: Insufficient documentation

## 2011-12-13 DIAGNOSIS — R0789 Other chest pain: Secondary | ICD-10-CM

## 2011-12-13 NOTE — Patient Instructions (Addendum)
Your physician recommends that you schedule a follow-up appointment in: AS NEEDED PENDING TEST RESULTS  Your physician has requested that you have an exercise tolerance test. For further information please visit [email or web address]. Please also follow instruction sheet, as given.   Your physician has requested that you have an echocardiogram. Echocardiography is a painless test that uses sound waves to create images of your heart. It provides your doctor with information about the size and shape of your heart and how well your heart's chambers and valves are working. This procedure takes approximately one hour. There are no restrictions for this procedure.

## 2011-12-13 NOTE — Assessment & Plan Note (Signed)
Patient's chest pain is most consistent with GI etiology. They have completely resolved with Pepcid AC. Note he does have dyspnea on exertion which may be related to COPD from previous tobacco use and obesity/deconditioning. I have recommended an exercise program. Will arrange exercise treadmill prior to initiating.

## 2011-12-13 NOTE — Assessment & Plan Note (Addendum)
Most likely from COPD and obesity/deconditioning. Also with history of hemachromatosis. Check echocardiogram for LV function and to exclude infiltrative cardiomyopathy. Note patient is certainly at risk for pulmonary embolus given his polycythemia vera and also long distance truck driving. However his dyspnea is chronic and unchanged. I discussed the importance of frequent stops when driving with ambulation.

## 2011-12-13 NOTE — Assessment & Plan Note (Signed)
Patient counseled on discontinuing. 

## 2011-12-13 NOTE — Progress Notes (Signed)
  HPI: 61 year old male with no prior cardiac history for evaluation of chest pain and dyspnea. Patient states that he had chest pain 2 months essentially continuously. The pain increased with lying flat. It was not pleuritic or exertional. It was substernal without radiation. No associated symptoms. He began taking tums and Pepcid AC and his symptoms completely resolved. He has had no chest pain in the past one month. He also notes chronic dyspnea on exertion. There is no orthopnea, PND, pedal edema, syncope. Because of the above we were asked to evaluate.  Current Outpatient Prescriptions  Medication Sig Dispense Refill  . allopurinol (ZYLOPRIM) 300 MG tablet Take 1 tablet by mouth Daily.      [name] anagrelide (AGRYLIN) 0.5 MG capsule Take 1 tablet by mouth Daily.      [name] aspirin 81 MG tablet Take 81 mg by mouth daily.      [name] lisinopril (PRINIVIL,ZESTRIL) 20 MG tablet Take 1 tablet by mouth Daily.      [name] ofloxacin (FLOXIN) 0.3 % otic solution 5 drops as needed.      . Oxymetazoline HCl (NASAL SPRAY) 0.05 % SOLN Place into the nose as needed.        No Known Allergies  Past Medical History  Diagnosis Date  . Polycythemia vera   . Back pain   . Hemochromatosis   . Gout   . Hypertension   . Nephrolithiasis     Past Surgical History  Procedure Date  . Ankle surgery   . Pilonidal cyst excision   . Tonsillectomy     History   Social History  . Marital Status: Married    Spouse Name: N/A    Number of Children: N/A  . Years of Education: N/A   Occupational History  . Not on file.   Social History Main Topics  . Smoking status: Current Everyday Smoker  . Smokeless tobacco: Not on file  . Alcohol Use: Yes     Occasional  . Drug Use: No  . Sexually Active: Not on file   Other Topics Concern  . Not on file   Social History Narrative  . No narrative on file    Family History  Problem Relation Age of Onset  . Heart disease      No family history    ROS: recent hematuria  from nephrolithiasis but no fevers or chills, productive cough, hemoptysis, dysphasia, odynophagia, melena, hematochezia, dysuria, rash, seizure activity, orthopnea, PND, pedal edema, claudication. Remaining systems are negative.  Physical Exam:   Blood pressure 138/82, pulse 70, height 5' 10"  (1.778 m), weight 281 lb (127.461 kg).  General:  Well developed/obese in NAD Skin warm/dry Patient not depressed No peripheral clubbing Back-normal HEENT-normal/normal eyelids Neck supple/normal carotid upstroke bilaterally; no bruits; no JVD; no thyromegaly chest - CTA/ normal expansion CV - RRR/normal S1 and S2; no murmurs, rubs or gallops;  PMI nondisplaced Abdomen -NT/ND, no HSM, no mass, + bowel sounds, no bruit 2+ femoral pulses, no bruits Ext-no edema, chords, previous left ankle surgery Neuro-grossly nonfocal  ECG 11/18/11 - sinus rhythm at a rate of 69. Left axis deviation. Nonspecific ST changes.

## 2011-12-20 ENCOUNTER — Ambulatory Visit (HOSPITAL_COMMUNITY): Payer: 59 | Attending: Cardiology | Admitting: Radiology

## 2011-12-20 DIAGNOSIS — R0600 Dyspnea, unspecified: Secondary | ICD-10-CM

## 2011-12-20 DIAGNOSIS — R0602 Shortness of breath: Secondary | ICD-10-CM

## 2011-12-20 DIAGNOSIS — R072 Precordial pain: Secondary | ICD-10-CM

## 2011-12-20 DIAGNOSIS — R0609 Other forms of dyspnea: Secondary | ICD-10-CM | POA: Insufficient documentation

## 2011-12-20 DIAGNOSIS — I519 Heart disease, unspecified: Secondary | ICD-10-CM | POA: Insufficient documentation

## 2011-12-20 DIAGNOSIS — R0989 Other specified symptoms and signs involving the circulatory and respiratory systems: Secondary | ICD-10-CM | POA: Insufficient documentation

## 2011-12-20 DIAGNOSIS — F17200 Nicotine dependence, unspecified, uncomplicated: Secondary | ICD-10-CM | POA: Insufficient documentation

## 2011-12-20 DIAGNOSIS — I10 Essential (primary) hypertension: Secondary | ICD-10-CM | POA: Insufficient documentation

## 2011-12-20 NOTE — Progress Notes (Signed)
Echocardiogram performed.  

## 2011-12-21 ENCOUNTER — Ambulatory Visit (INDEPENDENT_AMBULATORY_CARE_PROVIDER_SITE_OTHER): Payer: 59 | Admitting: Nurse Practitioner

## 2011-12-21 ENCOUNTER — Encounter: Payer: Self-pay | Admitting: Nurse Practitioner

## 2011-12-21 DIAGNOSIS — R0600 Dyspnea, unspecified: Secondary | ICD-10-CM

## 2011-12-21 DIAGNOSIS — R0602 Shortness of breath: Secondary | ICD-10-CM

## 2011-12-21 NOTE — Procedures (Signed)
Exercise Treadmill Test  Pre-Exercise Testing Evaluation3 Rhythm: sinus tachycardia  / RBBB Rate: 93   PR:  .13 QRS:  .11  QT:  .38 QTc: .47     Test  Exercise Tolerance Test Ordering MD: Olga Millers, MD  Interpreting MD: Ward Givens , NP  Unique Test No: 1  Treadmill:  1  Indication for ETT: exertional dyspnea  Contraindication to ETT: No   Stress Modality: exercise - treadmill  Cardiac Imaging Performed: non   Protocol: standard Bruce - maximal  Max BP:  182/59  Max MPHR (bpm):  160 85% MPR (bpm):  139  MPHR obtained (bpm):  142 % MPHR obtained:  88%  Reached 85% MPHR (min:sec):  5:24 Total Exercise Time (min-sec):  5:57  Workload in METS:  7.0 Borg Scale: 15  Reason ETT Terminated:  dyspnea    ST Segment Analysis At Rest: normal ST segments - no evidence of significant ST depression With Exercise: no evidence of significant ST depression  Other Information Arrhythmia:  occas pvc's Angina during ETT:  absent (0) Quality of ETT:  diagnostic  ETT Interpretation:  normal - no evidence of ischemia by ST analysis  Comments: No chest pain.  No acute st/t changes with exercise.  Recommendations: F/u Dr. Jens Som as needed.

## 2011-12-25 ENCOUNTER — Ambulatory Visit: Payer: 59 | Admitting: Cardiovascular Disease

## 2011-12-25 ENCOUNTER — Encounter: Payer: 59 | Admitting: Nurse Practitioner

## 2011-12-25 ENCOUNTER — Other Ambulatory Visit (HOSPITAL_COMMUNITY): Payer: 59

## 2011-12-26 ENCOUNTER — Other Ambulatory Visit (HOSPITAL_COMMUNITY): Payer: 59

## 2012-01-02 ENCOUNTER — Telehealth: Payer: Self-pay | Admitting: Hematology & Oncology

## 2012-01-02 NOTE — Telephone Encounter (Signed)
Patient's wife called and cx lab appt for 01/10/12

## 2012-01-10 ENCOUNTER — Other Ambulatory Visit: Payer: Self-pay | Admitting: *Deleted

## 2012-01-10 ENCOUNTER — Ambulatory Visit (HOSPITAL_BASED_OUTPATIENT_CLINIC_OR_DEPARTMENT_OTHER): Payer: 59

## 2012-01-10 ENCOUNTER — Other Ambulatory Visit (HOSPITAL_BASED_OUTPATIENT_CLINIC_OR_DEPARTMENT_OTHER): Payer: 59 | Admitting: Lab

## 2012-01-10 DIAGNOSIS — D45 Polycythemia vera: Secondary | ICD-10-CM

## 2012-01-10 DIAGNOSIS — D751 Secondary polycythemia: Secondary | ICD-10-CM

## 2012-01-10 LAB — CBC WITH DIFFERENTIAL (CANCER CENTER ONLY)
BASO#: 0.2 10*3/uL (ref 0.0–0.2)
BASO%: 1.3 % (ref 0.0–2.0)
EOS%: 2.8 % (ref 0.0–7.0)
Eosinophils Absolute: 0.4 10*3/uL (ref 0.0–0.5)
HCT: 46.9 % (ref 38.7–49.9)
HGB: 15.2 g/dL (ref 13.0–17.1)
LYMPH#: 2.6 10*3/uL (ref 0.9–3.3)
LYMPH%: 20.9 % (ref 14.0–48.0)
MCH: 26.2 pg — ABNORMAL LOW (ref 28.0–33.4)
MCHC: 32.4 g/dL (ref 32.0–35.9)
MCV: 81 fL — ABNORMAL LOW (ref 82–98)
MONO#: 1.1 10*3/uL — ABNORMAL HIGH (ref 0.1–0.9)
MONO%: 9.2 % (ref 0.0–13.0)
NEUT#: 8.1 10*3/uL — ABNORMAL HIGH (ref 1.5–6.5)
NEUT%: 65.8 % (ref 40.0–80.0)
Platelets: 584 10*3/uL — ABNORMAL HIGH (ref 145–400)
RBC: 5.8 10*6/uL — ABNORMAL HIGH (ref 4.20–5.70)
RDW: 18.6 % — ABNORMAL HIGH (ref 11.1–15.7)
WBC: 12.3 10*3/uL — ABNORMAL HIGH (ref 4.0–10.0)

## 2012-01-10 LAB — FERRITIN: Ferritin: 12 ng/mL — ABNORMAL LOW (ref 22–322)

## 2012-01-10 MED ORDER — ANAGRELIDE HCL 0.5 MG PO CAPS: 1.0000 mg | ORAL_CAPSULE | Freq: Every day | ORAL | Status: DC

## 2012-01-10 NOTE — Progress Notes (Signed)
William Villanueva presents today for phlebotomy per MD orders. Phlebotomy procedure started at 1545 and ended at 1605 removed. Patient observed for 30 minutes after procedure without any incident. Patient tolerated procedure well. IV needle removed intact.

## 2012-01-11 ENCOUNTER — Encounter: Payer: Self-pay | Admitting: Cardiology

## 2012-01-11 ENCOUNTER — Ambulatory Visit (INDEPENDENT_AMBULATORY_CARE_PROVIDER_SITE_OTHER)
Admission: RE | Admit: 2012-01-11 | Discharge: 2012-01-11 | Disposition: A | Discharge status: Home / Self Care | Payer: 59 | Source: Ambulatory Visit | Attending: Cardiology | Admitting: Cardiology

## 2012-01-11 ENCOUNTER — Encounter: Payer: Self-pay | Admitting: *Deleted

## 2012-01-11 ENCOUNTER — Other Ambulatory Visit: Payer: Self-pay | Admitting: Cardiology

## 2012-01-11 ENCOUNTER — Ambulatory Visit (INDEPENDENT_AMBULATORY_CARE_PROVIDER_SITE_OTHER): Payer: 59 | Admitting: Cardiology

## 2012-01-11 ENCOUNTER — Other Ambulatory Visit: Payer: Self-pay | Admitting: *Deleted

## 2012-01-11 VITALS — BP 116/70 | HR 87 | Wt 279.0 lb

## 2012-01-11 DIAGNOSIS — R9430 Abnormal result of cardiovascular function study, unspecified: Secondary | ICD-10-CM

## 2012-01-11 DIAGNOSIS — Z01810 Encounter for preprocedural cardiovascular examination: Secondary | ICD-10-CM

## 2012-01-11 DIAGNOSIS — I428 Other cardiomyopathies: Secondary | ICD-10-CM

## 2012-01-11 DIAGNOSIS — N289 Disorder of kidney and ureter, unspecified: Secondary | ICD-10-CM

## 2012-01-11 DIAGNOSIS — I420 Dilated cardiomyopathy: Secondary | ICD-10-CM | POA: Insufficient documentation

## 2012-01-11 DIAGNOSIS — R079 Chest pain, unspecified: Secondary | ICD-10-CM

## 2012-01-11 DIAGNOSIS — I426 Alcoholic cardiomyopathy: Secondary | ICD-10-CM

## 2012-01-11 DIAGNOSIS — F17200 Nicotine dependence, unspecified, uncomplicated: Secondary | ICD-10-CM

## 2012-01-11 DIAGNOSIS — Z720 Tobacco use: Secondary | ICD-10-CM

## 2012-01-11 LAB — CBC WITH DIFFERENTIAL/PLATELET
Basophils Absolute: 0.1 10*3/uL (ref 0.0–0.1)
Basophils Relative: 0.8 % (ref 0.0–3.0)
Eosinophils Absolute: 0.2 10*3/uL (ref 0.0–0.7)
Eosinophils Relative: 1.5 % (ref 0.0–5.0)
HCT: 46.4 % (ref 39.0–52.0)
Hemoglobin: 14.4 g/dL (ref 13.0–17.0)
Lymphocytes Relative: 19.4 % (ref 12.0–46.0)
Lymphs Abs: 2.7 10*3/uL (ref 0.7–4.0)
MCHC: 31 g/dL (ref 30.0–36.0)
MCV: 83.9 fl (ref 78.0–100.0)
Monocytes Absolute: 1.2 10*3/uL — ABNORMAL HIGH (ref 0.1–1.0)
Monocytes Relative: 8.5 % (ref 3.0–12.0)
Neutro Abs: 9.6 10*3/uL — ABNORMAL HIGH (ref 1.4–7.7)
Neutrophils Relative %: 69.8 % (ref 43.0–77.0)
Platelets: 573 10*3/uL — ABNORMAL HIGH (ref 150.0–400.0)
RBC: 5.53 Mil/uL (ref 4.22–5.81)
RDW: 17.9 % — ABNORMAL HIGH (ref 11.5–14.6)
WBC: 13.8 10*3/uL — ABNORMAL HIGH (ref 4.5–10.5)

## 2012-01-11 LAB — TSH: TSH: 0.35 u[IU]/mL (ref 0.35–5.50)

## 2012-01-11 LAB — BASIC METABOLIC PANEL
BUN: 22 mg/dL (ref 6–23)
CO2: 22 mEq/L (ref 19–32)
Calcium: 8.8 mg/dL (ref 8.4–10.5)
Chloride: 108 mEq/L (ref 96–112)
Creatinine, Ser: 1.7 mg/dL — ABNORMAL HIGH (ref 0.4–1.5)
GFR: 44.43 mL/min — ABNORMAL LOW (ref >60.00)
Glucose, Bld: 64 mg/dL — ABNORMAL LOW (ref 70–99)
Potassium: 4 mEq/L (ref 3.5–5.1)
Sodium: 139 mEq/L (ref 135–145)

## 2012-01-11 MED ORDER — METOPROLOL SUCCINATE ER 25 MG PO TB24: ORAL_TABLET | ORAL | Status: DC

## 2012-01-11 NOTE — Patient Instructions (Addendum)
Your physician has requested that you have a cardiac catheterization. Cardiac catheterization is used to diagnose and/or treat various heart conditions. Doctors may recommend this procedure for a number of different reasons. The most common reason is to evaluate chest pain. Chest pain can be a symptom of coronary artery disease (CAD), and cardiac catheterization can show whether plaque is narrowing or blocking your heart's arteries. This procedure is also used to evaluate the valves, as well as measure the blood flow and oxygen levels in different parts of your heart. For further information please visit [email or web address] Please follow instruction sheet, as given.   A chest x-ray takes a picture of the organs and structures inside the chest, including the heart, lungs, and blood vessels. This test can show several things, including, whether the heart is enlarges; whether fluid is building up in the lungs; and whether pacemaker / defibrillator leads are still in place.   Your physician recommends that you HAVE LAB WORK TODAY  START METOPROLOL ER 25 MG TAKE ONE HALF TABLET ONCE DAILY  Your physician has requested that you have a cardiac MRI. Cardiac MRI uses a computer to create images of your heart as its beating, producing both still and moving pictures of your heart and major blood vessels. For further information please visit [email or web address]. Please follow the instruction sheet given to you today for more information.

## 2012-01-11 NOTE — Assessment & Plan Note (Signed)
Patient counseled on discontinuing. 

## 2012-01-11 NOTE — Progress Notes (Signed)
   HPI: 61 year old male with no prior cardiac history I initially saw in August of 2013 for evaluation of chest pain and dyspnea. Echocardiogram in August of 2013 showed an ejection fraction of 35-40%. There was diffuse hypokinesis. Moderate left ventricular hypertrophy. The left atrium was mild to moderately dilated. Exercise treadmill in August of 2013 was negative. Since I last saw him he has some dyspnea on exertion but no orthopnea, PND, pedal edema or syncope. No palpitations or syncope. He has had some chest tightness with anxiety but denies exertional chest pain.  Current Outpatient Prescriptions  Medication Sig Dispense Refill  . allopurinol (ZYLOPRIM) 300 MG tablet Take 1 tablet by mouth Daily.      [name] anagrelide (AGRYLIN) 0.5 MG capsule Take 2 capsules (1 mg total) by mouth daily.  180 capsule  0  . aspirin 81 MG tablet Take 81 mg by mouth daily.      [name] lisinopril (PRINIVIL,ZESTRIL) 20 MG tablet Take 1 tablet by mouth Daily.      [name] ofloxacin (FLOXIN) 0.3 % otic solution 5 drops as needed.      . Oxymetazoline HCl (NASAL SPRAY) 0.05 % SOLN Place into the nose as needed.         Past Medical History  Diagnosis Date  . Polycythemia vera   . Back pain   . Hemochromatosis   . Gout   . Hypertension   . Nephrolithiasis   . Cardiomyopathy     Past Surgical History  Procedure Date  . Ankle surgery   . Pilonidal cyst excision   . Tonsillectomy     History   Social History  . Marital Status: Married    Spouse Name: N/A    Number of Children: N/A  . Years of Education: N/A   Occupational History  . Not on file.   Social History Main Topics  . Smoking status: Current Everyday Smoker  . Smokeless tobacco: Not on file  . Alcohol Use: Yes     Occasional  . Drug Use: No  . Sexually Active: Not on file   Other Topics Concern  . Not on file   Social History Narrative  . No narrative on file    ROS: no fevers or chills, productive cough, hemoptysis, dysphasia,  odynophagia, melena, hematochezia, dysuria, hematuria, rash, seizure activity, orthopnea, PND, pedal edema, claudication. Remaining systems are negative.  Physical Exam: Well-developed obese in no acute distress.  Skin is warm and dry.  HEENT is normal.  Neck is supple.  Chest is clear to auscultation with normal expansion.  Cardiovascular exam is regular rate and rhythm.  Abdominal exam nontender or distended. No masses palpated. Extremities show no edema. neuro grossly intact  ECG sinus rhythm with PACs. No ST changes. Left axis deviation. RV conduction delay.

## 2012-01-11 NOTE — Assessment & Plan Note (Signed)
Etiology unclear. No history of alcohol abuse. Blood pressure is controlled. Check TSH. Patient also with history of hemochromatosis requiring phlebotomy. I will arrange a cardiac MRI to exclude cardiac involvement. Proceed with right and left cardiac catheterization to exclude coronary disease and restrictive physiology. The risks and benefits were discussed and the patient agrees to proceed. Continue aspirin and ACE inhibitor. Add Toprol 12.5 mg daily.

## 2012-01-12 ENCOUNTER — Ambulatory Visit (INDEPENDENT_AMBULATORY_CARE_PROVIDER_SITE_OTHER): Payer: 59 | Admitting: *Deleted

## 2012-01-12 ENCOUNTER — Telehealth: Payer: Self-pay | Admitting: Cardiology

## 2012-01-12 DIAGNOSIS — N289 Disorder of kidney and ureter, unspecified: Secondary | ICD-10-CM

## 2012-01-12 DIAGNOSIS — I426 Alcoholic cardiomyopathy: Secondary | ICD-10-CM

## 2012-01-12 LAB — BASIC METABOLIC PANEL
BUN: 24 mg/dL — ABNORMAL HIGH (ref 6–23)
CO2: 21 mEq/L (ref 19–32)
Calcium: 8.7 mg/dL (ref 8.4–10.5)
Chloride: 108 mEq/L (ref 96–112)
Creatinine, Ser: 1.5 mg/dL (ref 0.4–1.5)
GFR: 52.66 mL/min — ABNORMAL LOW (ref >60.00)
Glucose, Bld: 137 mg/dL — ABNORMAL HIGH (ref 70–99)
Potassium: 4.6 mEq/L (ref 3.5–5.1)
Sodium: 136 mEq/L (ref 135–145)

## 2012-01-12 LAB — PROTIME-INR
INR: 1.1 ratio — ABNORMAL HIGH (ref 0.8–1.0)
Prothrombin Time: 12.1 s (ref 10.2–12.4)

## 2012-01-12 NOTE — Telephone Encounter (Signed)
New Problem:    Patient called in wanting to know if his procedure will still be happening on Monday.  Please call back.

## 2012-01-12 NOTE — Telephone Encounter (Signed)
Spoke with pt, aware labs not resulted yet.

## 2012-01-15 ENCOUNTER — Inpatient Hospital Stay (HOSPITAL_BASED_OUTPATIENT_CLINIC_OR_DEPARTMENT_OTHER)
Admission: RE | Admit: 2012-01-15 | Discharge: 2012-01-15 | Disposition: A | Discharge status: Home / Self Care | Payer: 59 | Source: Ambulatory Visit | Attending: Cardiovascular Disease | Admitting: Cardiovascular Disease

## 2012-01-15 ENCOUNTER — Encounter: Payer: Self-pay | Admitting: Cardiology

## 2012-01-15 ENCOUNTER — Encounter (HOSPITAL_BASED_OUTPATIENT_CLINIC_OR_DEPARTMENT_OTHER): Payer: Self-pay | Admitting: *Deleted

## 2012-01-15 ENCOUNTER — Encounter (HOSPITAL_BASED_OUTPATIENT_CLINIC_OR_DEPARTMENT_OTHER)
Admission: RE | Disposition: A | Discharge status: Home / Self Care | Payer: Self-pay | Source: Ambulatory Visit | Attending: Cardiovascular Disease

## 2012-01-15 ENCOUNTER — Telehealth: Payer: Self-pay | Admitting: Physician Assistant

## 2012-01-15 DIAGNOSIS — I2510 Atherosclerotic heart disease of native coronary artery without angina pectoris: Secondary | ICD-10-CM

## 2012-01-15 DIAGNOSIS — R0609 Other forms of dyspnea: Secondary | ICD-10-CM | POA: Insufficient documentation

## 2012-01-15 DIAGNOSIS — R0989 Other specified symptoms and signs involving the circulatory and respiratory systems: Secondary | ICD-10-CM | POA: Insufficient documentation

## 2012-01-15 DIAGNOSIS — I10 Essential (primary) hypertension: Secondary | ICD-10-CM | POA: Insufficient documentation

## 2012-01-15 DIAGNOSIS — I428 Other cardiomyopathies: Secondary | ICD-10-CM

## 2012-01-15 SURGERY — JV LEFT AND RIGHT HEART CATHETERIZATION WITH CORONARY ANGIOGRAM
Anesthesia: Moderate Sedation

## 2012-01-15 MED ORDER — SODIUM CHLORIDE 0.9 % IV SOLN: INTRAVENOUS | Status: AC

## 2012-01-15 MED ORDER — SODIUM CHLORIDE 0.9 % IJ SOLN: 3.0000 mL | INTRAMUSCULAR | Status: DC | PRN

## 2012-01-15 MED ORDER — SODIUM CHLORIDE 0.9 % IJ SOLN: 3.0000 mL | Freq: Two times a day (BID) | INTRAMUSCULAR | Status: DC

## 2012-01-15 MED ORDER — DIAZEPAM 2 MG PO TABS
2.0000 mg | ORAL_TABLET | ORAL | Status: AC
  Administered 2012-01-15: 2 mg via ORAL

## 2012-01-15 MED ORDER — SODIUM CHLORIDE 0.9 % IV SOLN
1.0000 mL/kg/h | INTRAVENOUS | Status: DC
  Administered 2012-01-15: 1 mL/kg/h via INTRAVENOUS

## 2012-01-15 MED ORDER — ACETAMINOPHEN 325 MG PO TABS: 650.0000 mg | ORAL_TABLET | ORAL | Status: DC | PRN

## 2012-01-15 MED ORDER — SODIUM CHLORIDE 0.9 % IV SOLN: 250.0000 mL | INTRAVENOUS | Status: DC | PRN

## 2012-01-15 MED ORDER — ASPIRIN 81 MG PO CHEW
324.0000 mg | CHEWABLE_TABLET | ORAL | Status: AC
  Administered 2012-01-15: 243 mg via ORAL

## 2012-01-15 NOTE — H&P (View-Only) (Signed)
   HPI: 61 year old male with no prior cardiac history I initially saw in August of 2013 for evaluation of chest pain and dyspnea. Echocardiogram in August of 2013 showed an ejection fraction of 35-40%. There was diffuse hypokinesis. Moderate left ventricular hypertrophy. The left atrium was mild to moderately dilated. Exercise treadmill in August of 2013 was negative. Since I last saw him he has some dyspnea on exertion but no orthopnea, PND, pedal edema or syncope. No palpitations or syncope. He has had some chest tightness with anxiety but denies exertional chest pain.  Current Outpatient Prescriptions  Medication Sig Dispense Refill  . allopurinol (ZYLOPRIM) 300 MG tablet Take 1 tablet by mouth Daily.      [name] anagrelide (AGRYLIN) 0.5 MG capsule Take 2 capsules (1 mg total) by mouth daily.  180 capsule  0  . aspirin 81 MG tablet Take 81 mg by mouth daily.      [name] lisinopril (PRINIVIL,ZESTRIL) 20 MG tablet Take 1 tablet by mouth Daily.      [name] ofloxacin (FLOXIN) 0.3 % otic solution 5 drops as needed.      . Oxymetazoline HCl (NASAL SPRAY) 0.05 % SOLN Place into the nose as needed.         Past Medical History  Diagnosis Date  . Polycythemia vera   . Back pain   . Hemochromatosis   . Gout   . Hypertension   . Nephrolithiasis   . Cardiomyopathy     Past Surgical History  Procedure Date  . Ankle surgery   . Pilonidal cyst excision   . Tonsillectomy     History   Social History  . Marital Status: Married    Spouse Name: N/A    Number of Children: N/A  . Years of Education: N/A   Occupational History  . Not on file.   Social History Main Topics  . Smoking status: Current Everyday Smoker  . Smokeless tobacco: Not on file  . Alcohol Use: Yes     Occasional  . Drug Use: No  . Sexually Active: Not on file   Other Topics Concern  . Not on file   Social History Narrative  . No narrative on file    ROS: no fevers or chills, productive cough, hemoptysis, dysphasia,  odynophagia, melena, hematochezia, dysuria, hematuria, rash, seizure activity, orthopnea, PND, pedal edema, claudication. Remaining systems are negative.  Physical Exam: Well-developed obese in no acute distress.  Skin is warm and dry.  HEENT is normal.  Neck is supple.  Chest is clear to auscultation with normal expansion.  Cardiovascular exam is regular rate and rhythm.  Abdominal exam nontender or distended. No masses palpated. Extremities show no edema. neuro grossly intact  ECG sinus rhythm with PACs. No ST changes. Left axis deviation. RV conduction delay.

## 2012-01-15 NOTE — CV Procedure (Signed)
    Cardiac Catheterization Operative Report  William Villanueva [id number] 9/9/20138:35 AM CLOWARD,DAVIS L, MD  Procedure Performed:  1. Left Heart Catheterization 2. Selective Coronary Angiography 3. Right Heart Catheterization 4. Left ventricular pressures  Operator: Lauree Chandler, MD  Indication:  Dyspnea, cardiomyopathy, hemochromatosis. Exclude CAD and restrictive physiology.                                 Procedure Details: The risks, benefits, complications, treatment options, and expected outcomes were discussed with the patient. The patient and/or family concurred with the proposed plan, giving informed consent. The patient was brought to the cath lab after IV hydration was begun and oral premedication was given. The patient was further sedated with Versed and Fentanyl. The right groin was prepped and draped in the usual manner. Using the modified Seldinger access technique, a 4 French sheath was placed in the femoral artery. A 6 French sheath was inserted into the right femoral vein. A multi-purpose catheter was used to perform a right heart catheterization. I then measured simultaneous pressures in the LV/RV and LV/PCWP. Standard diagnostic catheters were used to perform selective coronary angiography. A pigtail catheter was used to perform a left ventricular angiogram. There were no immediate complications. The patient was taken to the recovery area in stable condition.   Hemodynamic Findings: Ao:   115/67        LV:  121/12/16 RA:  3            RV: 28/5/10 PA:  30/13 (mean 21) PCWP:  10 Fick Cardiac Output: 5.3 L/min Fick Cardiac Index: 2.2 L/min/m2 Central Aortic Saturation: 91% Pulmonary Artery Saturation: 62%   Angiographic Findings:  Left main:  No obstructive disease.   Left Anterior Descending Artery: Moderate sized vessel with 20% plaque in the proximal and mid vessel. The distal vessel becomes small in caliber and does not reach the apex. The diagonal  is a moderated sized branch with mild plaque.   Circumflex Artery: Moderate sized vessel with 20% plaque in the proximal and mid segment. The first OM is small and has mild plaque. The second OM branch is moderate sized and a has a 20% stenosis.   Right Coronary Artery: Large, dominant vessel with no obstructive disease noted in the proximal, mid or distal vessel. The posterolateral branch has a 40% stenosis.   Left Ventricular Angiogram: Deferred.   Impression: 1. Mild, non-obstructive CAD 2. No hemodynamic evidence of restriction.  Recommendations: Medical management of CAD and cardiomyopathy.        Complications:  None; patient tolerated the procedure well.

## 2012-01-15 NOTE — Telephone Encounter (Signed)
Pt's wife called this evening about her husband's cath site. He is having some soreness. No swelling, bruising, bleeding. She wants to know if he can take Tylenol extra strength - I told her this is fine and she can take up to 2 tablets (total 1g) every 6 hours as needed for pain, and to watch for any warning signs and let us know if he continues to have trouble. She verbalized understanding and gratitude. Geovannie Vilar PA-C

## 2012-01-15 NOTE — Interval H&P Note (Signed)
History and Physical Interval Note:  01/15/2012 7:51 AM  William Villanueva  has presented today for cardiac cath  with the diagnosis of SOB.   The various methods of treatment have been discussed with the patient and family. After consideration of risks, benefits and other options for treatment, the patient has consented to  Procedure(s) (LRB) with comments: JV LEFT AND RIGHT HEART CATHETERIZATION WITH CORONARY ANGIOGRAM (N/A) as a surgical intervention .  The patient's history has been reviewed, patient examined, no change in status, stable for surgery.  I have reviewed the patient's chart and labs.  Questions were answered to the patient's satisfaction.     [name]

## 2012-01-17 ENCOUNTER — Other Ambulatory Visit: Payer: Self-pay | Admitting: *Deleted

## 2012-01-17 ENCOUNTER — Other Ambulatory Visit (INDEPENDENT_AMBULATORY_CARE_PROVIDER_SITE_OTHER): Payer: 59

## 2012-01-17 DIAGNOSIS — N289 Disorder of kidney and ureter, unspecified: Secondary | ICD-10-CM

## 2012-01-17 LAB — BASIC METABOLIC PANEL
BUN: 21 mg/dL (ref 6–23)
CO2: 24 mEq/L (ref 19–32)
Calcium: 9.3 mg/dL (ref 8.4–10.5)
Chloride: 106 mEq/L (ref 96–112)
Creatinine, Ser: 1.3 mg/dL (ref 0.4–1.5)
GFR: 60.26 mL/min (ref >60.00)
Glucose, Bld: 112 mg/dL — ABNORMAL HIGH (ref 70–99)
Potassium: 4.9 mEq/L (ref 3.5–5.1)
Sodium: 138 mEq/L (ref 135–145)

## 2012-01-18 ENCOUNTER — Ambulatory Visit (HOSPITAL_COMMUNITY)
Admission: RE | Admit: 2012-01-18 | Discharge: 2012-01-18 | Disposition: A | Discharge status: Home / Self Care | Payer: 59 | Source: Ambulatory Visit | Attending: Cardiology | Admitting: Cardiology

## 2012-01-18 DIAGNOSIS — I428 Other cardiomyopathies: Secondary | ICD-10-CM | POA: Insufficient documentation

## 2012-01-18 DIAGNOSIS — I420 Dilated cardiomyopathy: Secondary | ICD-10-CM

## 2012-01-18 DIAGNOSIS — R6889 Other general symptoms and signs: Secondary | ICD-10-CM | POA: Insufficient documentation

## 2012-01-18 DIAGNOSIS — Z720 Tobacco use: Secondary | ICD-10-CM

## 2012-01-18 DIAGNOSIS — R9430 Abnormal result of cardiovascular function study, unspecified: Secondary | ICD-10-CM

## 2012-01-18 DIAGNOSIS — R079 Chest pain, unspecified: Secondary | ICD-10-CM

## 2012-01-18 DIAGNOSIS — Z01810 Encounter for preprocedural cardiovascular examination: Secondary | ICD-10-CM

## 2012-01-18 DIAGNOSIS — I426 Alcoholic cardiomyopathy: Secondary | ICD-10-CM

## 2012-01-18 LAB — POCT I-STAT 3, ART BLOOD GAS (G3+)
Acid-base deficit: 4 mmol/L — ABNORMAL HIGH (ref 0.0–2.0)
Bicarbonate: 20.6 mEq/L (ref 20.0–24.0)
O2 Saturation: 91 %
TCO2: 22 mmol/L (ref 0–100)
pCO2 arterial: 35.7 mmHg (ref 35.0–45.0)
pH, Arterial: 7.368 (ref 7.350–7.450)
pO2, Arterial: 62 mmHg — ABNORMAL LOW (ref 80.0–100.0)

## 2012-01-18 LAB — POCT I-STAT GLUCOSE
Glucose, Bld: 94 mg/dL (ref 70–99)
Operator id: 221371

## 2012-01-18 LAB — POCT I-STAT 3, VENOUS BLOOD GAS (G3P V)
Acid-base deficit: 2 mmol/L (ref 0.0–2.0)
Bicarbonate: 23.5 mEq/L (ref 20.0–24.0)
O2 Saturation: 62 %
TCO2: 25 mmol/L (ref 0–100)
pCO2, Ven: 43.3 mmHg — ABNORMAL LOW (ref 45.0–50.0)
pH, Ven: 7.341 — ABNORMAL HIGH (ref 7.250–7.300)
pO2, Ven: 34 mmHg (ref 30.0–45.0)

## 2012-01-18 MED ORDER — GADOBENATE DIMEGLUMINE 529 MG/ML IV SOLN
36.0000 mL | Freq: Once | INTRAVENOUS | Status: AC
  Administered 2012-01-18: 36 mL via INTRAVENOUS

## 2012-01-19 DIAGNOSIS — I428 Other cardiomyopathies: Secondary | ICD-10-CM

## 2012-01-23 NOTE — Addendum Note (Signed)
Addended by: Kem Parkinson on: 01/23/2012 11:30 AM   Modules accepted: Orders

## 2012-02-05 ENCOUNTER — Encounter: Payer: Self-pay | Admitting: Cardiology

## 2012-02-05 ENCOUNTER — Ambulatory Visit (INDEPENDENT_AMBULATORY_CARE_PROVIDER_SITE_OTHER): Payer: 59 | Admitting: Cardiology

## 2012-02-05 VITALS — BP 140/68 | HR 84 | Ht 70.5 in | Wt 285.0 lb

## 2012-02-05 DIAGNOSIS — I429 Cardiomyopathy, unspecified: Secondary | ICD-10-CM

## 2012-02-05 DIAGNOSIS — I428 Other cardiomyopathies: Secondary | ICD-10-CM

## 2012-02-05 MED ORDER — LISINOPRIL 40 MG PO TABS: 40.0000 mg | ORAL_TABLET | Freq: Every day | ORAL | Status: DC

## 2012-02-05 MED ORDER — METOPROLOL SUCCINATE ER 25 MG PO TB24: 25.0000 mg | ORAL_TABLET | Freq: Every day | ORAL | Status: DC

## 2012-02-05 NOTE — Patient Instructions (Addendum)
Your physician recommends that you schedule a follow-up appointment in: 8 WEEKS WITH DR CRENSHAW  INCREASE METOPROLOL TO 25 MG ONE TABLET ONCE DAILY  INCREASE LISINOPRIL TO 40 MG ONCE DAILY  Your physician recommends that you return for lab work in: ONE WEEK

## 2012-02-05 NOTE — Assessment & Plan Note (Signed)
Etiology remains unclear. Catheterization did not demonstrate significant coronary disease. Cardiac MRI did not show hemochromatosis. Patient does not have a significant alcohol history. Plan medical therapy. Increase lisinopril to 40 mg daily and increase Toprol to 25 mg daily. Check potassium and renal function in one week. Titrate medications as tolerated. Once fully titrated repeat echocardiogram in 3 months to see if LV function has improved.

## 2012-02-05 NOTE — Progress Notes (Signed)
   HPI: 61 year old male with no prior cardiac history I initially saw in August of 2013 for evaluation of chest pain and dyspnea. Echocardiogram in August of 2013 showed an ejection fraction of 35-40%. There was diffuse hypokinesis. Moderate left ventricular hypertrophy. The left atrium was mild to moderately dilated. Exercise treadmill in August of 2013 was negative. Cardiac catheterization in September of 2013 showed mild nonobstructive coronary disease. There was no hemodynamic evidence of restriction. Pulmonary capillary wedge pressure 10. Cardiac MRI in September of 2013 showed an ejection fraction of 34% with diffuse hypokinesis. There was no hyperenhancement or scar tissue and no evidence of cardiac hemochromatosis. There was moderate left atrial enlargement. TSH in September 2013 normal. Since I last saw him, he feels better. He has mild dyspnea on exertion but no orthopnea, PND, pedal edema, chest pain or syncope.   Current Outpatient Prescriptions  Medication Sig Dispense Refill  . allopurinol (ZYLOPRIM) 300 MG tablet Take 1 tablet by mouth Daily.      [name] anagrelide (AGRYLIN) 0.5 MG capsule Take 2 capsules (1 mg total) by mouth daily.  180 capsule  0  . aspirin 81 MG tablet Take 81 mg by mouth daily.      [name] lisinopril (PRINIVIL,ZESTRIL) 20 MG tablet Take 1 tablet by mouth Daily.      . metoprolol succinate (TOPROL XL) 25 MG 24 hr tablet ONE HALF TABLET ONCE DAILY  30 tablet  11  . ofloxacin (FLOXIN) 0.3 % otic solution 5 drops as needed.      . Oxymetazoline HCl (NASAL SPRAY) 0.05 % SOLN Place into the nose as needed.         Past Medical History  Diagnosis Date  . Polycythemia vera   . Back pain   . Hemochromatosis   . Gout   . Hypertension   . Nephrolithiasis   . Cardiomyopathy     Past Surgical History  Procedure Date  . Ankle surgery   . Pilonidal cyst excision   . Tonsillectomy     History   Social History  . Marital Status: Married    Spouse Name: N/A    Number  of Children: N/A  . Years of Education: N/A   Occupational History  . Not on file.   Social History Main Topics  . Smoking status: Current Every Day Smoker  . Smokeless tobacco: Not on file  . Alcohol Use: Yes     Occasional  . Drug Use: No  . Sexually Active: Not on file   Other Topics Concern  . Not on file   Social History Narrative  . No narrative on file    ROS: no fevers or chills, productive cough, hemoptysis, dysphasia, odynophagia, melena, hematochezia, dysuria, hematuria, rash, seizure activity, orthopnea, PND, pedal edema, claudication. Remaining systems are negative.  Physical Exam: Well-developed obese in no acute distress.  Skin is warm and dry.  HEENT is normal.  Neck is supple.  Chest is clear to auscultation with normal expansion.  Cardiovascular exam is regular rate and rhythm.  Abdominal exam nontender or distended. No masses palpated. Extremities show no edema. neuro grossly intact

## 2012-02-05 NOTE — Assessment & Plan Note (Signed)
Patient counseled on discontinuing. 

## 2012-02-13 ENCOUNTER — Encounter: Payer: Self-pay | Admitting: Cardiology

## 2012-02-20 ENCOUNTER — Telehealth: Payer: Self-pay | Admitting: Cardiology

## 2012-02-20 NOTE — Telephone Encounter (Signed)
New problem:  C/o nose bleed. No history of nose bleed . Start metoprolol.

## 2012-02-20 NOTE — Telephone Encounter (Signed)
Spoke with pt, yesterday he reports having a small nose bleed in the morning and then last night while resting he had a larger nose bleed. He states it ran down his face and he had to put an ice pack on it to get it to stop. This morning he has had another nose bleed but not as bad as last night. He started metoprolol about 3 weeks ago. Discussed with dr Jens Som and explained to pt that is a side effect we have never seen from toprol. He will try using saline nasal spray to keep his sinuses moist. He will call back if he cont to have issues. Pt agreed with this plan.

## 2012-03-12 ENCOUNTER — Telehealth: Payer: Self-pay | Admitting: Hematology & Oncology

## 2012-03-12 NOTE — Telephone Encounter (Signed)
Pt made 11-18 lab and MD appointment. MD aware

## 2012-03-25 ENCOUNTER — Encounter: Payer: Self-pay | Admitting: Cardiology

## 2012-03-25 ENCOUNTER — Ambulatory Visit (INDEPENDENT_AMBULATORY_CARE_PROVIDER_SITE_OTHER): Payer: 59 | Admitting: Cardiology

## 2012-03-25 ENCOUNTER — Ambulatory Visit (HOSPITAL_BASED_OUTPATIENT_CLINIC_OR_DEPARTMENT_OTHER): Payer: 59 | Admitting: Hematology & Oncology

## 2012-03-25 ENCOUNTER — Other Ambulatory Visit (HOSPITAL_BASED_OUTPATIENT_CLINIC_OR_DEPARTMENT_OTHER): Payer: 59 | Admitting: Lab

## 2012-03-25 ENCOUNTER — Ambulatory Visit (HOSPITAL_BASED_OUTPATIENT_CLINIC_OR_DEPARTMENT_OTHER): Payer: 59

## 2012-03-25 VITALS — BP 150/70 | HR 64 | Ht 70.0 in | Wt 283.0 lb

## 2012-03-25 VITALS — BP 120/52 | HR 79 | Temp 97.9°F | Resp 18 | Ht 70.0 in | Wt 283.0 lb

## 2012-03-25 DIAGNOSIS — D45 Polycythemia vera: Secondary | ICD-10-CM

## 2012-03-25 DIAGNOSIS — I428 Other cardiomyopathies: Secondary | ICD-10-CM

## 2012-03-25 LAB — CBC WITH DIFFERENTIAL (CANCER CENTER ONLY)
BASO#: 0.2 10*3/uL (ref 0.0–0.2)
BASO%: 1.7 % (ref 0.0–2.0)
EOS%: 4.4 % (ref 0.0–7.0)
Eosinophils Absolute: 0.5 10*3/uL (ref 0.0–0.5)
HCT: 45.4 % (ref 38.7–49.9)
HGB: 14.6 g/dL (ref 13.0–17.1)
LYMPH#: 2.6 10*3/uL (ref 0.9–3.3)
LYMPH%: 22.4 % (ref 14.0–48.0)
MCH: 26.2 pg — ABNORMAL LOW (ref 28.0–33.4)
MCHC: 32.2 g/dL (ref 32.0–35.9)
MCV: 82 fL (ref 82–98)
MONO#: 1.2 10*3/uL — ABNORMAL HIGH (ref 0.1–0.9)
MONO%: 10.4 % (ref 0.0–13.0)
NEUT#: 7 10*3/uL — ABNORMAL HIGH (ref 1.5–6.5)
NEUT%: 61.1 % (ref 40.0–80.0)
Platelets: 576 10*3/uL — ABNORMAL HIGH (ref 145–400)
RBC: 5.57 10*6/uL (ref 4.20–5.70)
RDW: 16.9 % — ABNORMAL HIGH (ref 11.1–15.7)
WBC: 11.4 10*3/uL — ABNORMAL HIGH (ref 4.0–10.0)

## 2012-03-25 LAB — FERRITIN: Ferritin: 11 ng/mL — ABNORMAL LOW (ref 22–322)

## 2012-03-25 LAB — CHCC SATELLITE - SMEAR

## 2012-03-25 MED ORDER — METOPROLOL SUCCINATE ER 50 MG PO TB24: 50.0000 mg | ORAL_TABLET | Freq: Every day | ORAL | Status: DC

## 2012-03-25 NOTE — Patient Instructions (Addendum)
Increase Metoprolol to 50mg  daily.  Your physician has requested that you have an echocardiogram in 3 months. Echocardiography is a painless test that uses sound waves to create images of your heart. It provides your doctor with information about the size and shape of your heart and how well your heart's chambers and valves are working. This procedure takes approximately one hour. There are no restrictions for this procedure.  Your physician wants you to follow-up in: 4 months with Dr. Jens Som. You will receive a reminder letter in the mail two months in advance. If you don't receive a letter, please call our office to schedule the follow-up appointment.

## 2012-03-25 NOTE — Progress Notes (Signed)
This office note has been dictated.

## 2012-03-25 NOTE — Progress Notes (Signed)
William Villanueva presents today for phlebotomy per MD orders. Phlebotomy procedure started at 1315 and ended at 1330. 500 grams removed. Patient observed for 30 minutes after procedure without any incident. Patient tolerated procedure well. IV needle removed intact.

## 2012-03-25 NOTE — Assessment & Plan Note (Signed)
Patient counseled on discontinuing. 

## 2012-03-25 NOTE — Assessment & Plan Note (Signed)
Etiology remains unclear. Plan to continue medical therapy with lisinopril. Increase Toprol to 50 mg daily. Repeat echocardiogram in 3 months.

## 2012-03-25 NOTE — Patient Instructions (Addendum)
..Therapeutic Phlebotomy   .  Therapeutic phlebotomy is the controlled removal of blood from your body for the purpose of treating a medical condition. It is similar to donating blood. Usually, about a pint (470 mL) of blood is removed. The average adult has 9 to 12 pints (4.3 to 5.7 L) of blood. Therapeutic phlebotomy may be used to treat the following medical conditions:  Hemochromatosis. This is a condition in which there is too much iron in the blood.  Polycythemia vera. This is a condition in which there are too many red cells in the blood.  Porphyria cutanea tarda. This is a disease usually passed from one generation to the next (inherited). It is a condition in which an important part of hemoglobin is not made properly. This results in the build up of abnormal amounts of porphyrins in the body.  Sickle cell disease. This is an inherited disease. It is a condition in which the red blood cells form an abnormal crescent shape rather than a round shape. LET YOUR CAREGIVER KNOW ABOUT:  Allergies.  Medicines taken including herbs, eyedrops, over-the-counter medicines, and creams.  Use of steroids (by mouth or creams).  Previous problems with anesthetics or numbing medicine.  History of blood clots.  History of bleeding or blood problems.  Previous surgery.  Possibility of pregnancy, if this applies. RISKS AND COMPLICATIONS This is a simple and safe procedure. Problems are unlikely. However, problems can occur and may include:  Nausea or lightheadedness.  Low blood pressure.  Soreness, bleeding, swelling, or bruising at the needle insertion site.  Infection. BEFORE THE PROCEDURE  This is a procedure that can be done as an outpatient. Confirm the time that you need to arrive for your procedure. Confirm whether there is a need to fast or withhold any medications. It is helpful to wear clothing with sleeves that can be raised above the elbow. A blood sample may be done to  determine the amount of red blood cells or iron in your blood. Plan ahead of time to have someone drive you home after the procedure. PROCEDURE The entire procedure from preparation through recovery takes about 1 hour. The actual collection takes about 10 to 15 minutes.  A needle will be inserted into your vein.  Tubing and a collection bag will be attached to that needle.  Blood will flow through the needle and tubing into the collection bag.  You may be asked to open and close your hand slowly and continuously during the entire collection.  Once the specified amount of blood has been removed from your body, the collection bag and tubing will be clamped.  The needle will be removed.  Pressure will be held on the site of the needle insertion to stop the bleeding. Then a bandage will be placed over the needle insertion site. AFTER THE PROCEDURE  Your recovery will be assessed and monitored. If there are no problems, as an outpatient, you should be able to go home shortly after the procedure.  Document Released: 09/26/2010 Document Revised: 07/17/2011 Document Reviewed: 09/26/2010 [facility] Patient Information 2013 [place], [place]. [facility] Discharge Instructions for Patients Receiving Chemotherapy  Today you received the following chemotherapy agents ABVD (Adriamycin, Bleomycin, Velban, and DTIC)  To help prevent nausea and vomiting after your treatment, we encourage you to take your nausea medication   1) Zofran (Ondansetron) 8 mg twice a day for 3 days beginning the day after chemotherapy.  Take one in the morning and one in the  evening to help prevent nausea  2) Decadron (Dexamethasone) Take 8 mg one time daily for 3 days beginning morning after chemotherapy  3) Compazine (Prochlorperazine) Take 10 mg every 6 hours as needed for nausea.   If you develop nausea and vomiting that is not controlled by your nausea medication, call the clinic. If it is after clinic  hours your family physician or the after hours number for the clinic or go to the Emergency Department.   BELOW ARE SYMPTOMS THAT SHOULD BE REPORTED IMMEDIATELY:  *FEVER GREATER THAN 100.5 F  *CHILLS WITH OR WITHOUT FEVER  NAUSEA AND VOMITING THAT IS NOT CONTROLLED WITH YOUR NAUSEA MEDICATION  *UNUSUAL SHORTNESS OF BREATH  *UNUSUAL BRUISING OR BLEEDING  TENDERNESS IN MOUTH AND THROAT WITH OR WITHOUT PRESENCE OF ULCERS  *URINARY PROBLEMS  *BOWEL PROBLEMS  UNUSUAL RASH Items with * indicate a potential emergency and should be followed up as soon as possible.  One of the nurses will contact you 24 hours after your treatment. Please let the nurse know about any problems that you may have experienced. Feel free to call the clinic [phone]   I have been informed and understand all the instructions given to me. I know to contact the clinic, my physician, or go to the Emergency Department if any problems should occur. I do not have any questions at this time, but understand that I may call the clinic during office hours   should I have any questions or need assistance in obtaining follow up care.    __________________________________________  _____________  __________ Signature of Patient or Authorized Representative            Date                   Time    __________________________________________ Nurse's Signature

## 2012-03-25 NOTE — Progress Notes (Signed)
   HPI: Pleasant male for fu of cardiomyopathy. Echocardiogram in August of 2013 showed an ejection fraction of 35-40%. There was diffuse hypokinesis. Moderate left ventricular hypertrophy. The left atrium was mild to moderately dilated. Exercise treadmill in August of 2013 was negative. Cardiac catheterization in September of 2013 showed mild nonobstructive coronary disease. There was no hemodynamic evidence of restriction. Pulmonary capillary wedge pressure 10. Cardiac MRI in September of 2013 showed an ejection fraction of 34% with diffuse hypokinesis. There was no hyperenhancement or scar tissue and no evidence of cardiac hemochromatosis. There was moderate left atrial enlargement. TSH in September 2013 normal. Since I last saw him in Sept 2013, he has some dyspnea on exertion but improved. No orthopnea, PND, pedal edema, palpitations, syncope or chest pain.   Current Outpatient Prescriptions  Medication Sig Dispense Refill  . allopurinol (ZYLOPRIM) 300 MG tablet Take 1 tablet by mouth Daily.      [name] anagrelide (AGRYLIN) 0.5 MG capsule Take 2 capsules (1 mg total) by mouth daily.  180 capsule  0  . aspirin 81 MG tablet Take 81 mg by mouth daily.      [name] lisinopril (PRINIVIL,ZESTRIL) 40 MG tablet Take 1 tablet (40 mg total) by mouth daily.  30 tablet  12  . metoprolol succinate (TOPROL-XL) 25 MG 24 hr tablet Take 25 mg by mouth daily.      [name] ofloxacin (FLOXIN) 0.3 % otic solution 5 drops as needed.      . Oxymetazoline HCl (NASAL SPRAY) 0.05 % SOLN Place into the nose as needed.      . [DISCONTINUED] metoprolol succinate (TOPROL XL) 25 MG 24 hr tablet Take 1 tablet (25 mg total) by mouth daily. ONE HALF TABLET ONCE DAILY  30 tablet  11     Past Medical History  Diagnosis Date  . Polycythemia vera   . Back pain   . Hemochromatosis   . Gout   . Hypertension   . Nephrolithiasis   . Cardiomyopathy     Past Surgical History  Procedure Date  . Ankle surgery   . Pilonidal cyst excision   .  Tonsillectomy     History   Social History  . Marital Status: Married    Spouse Name: N/A    Number of Children: N/A  . Years of Education: N/A   Occupational History  . Not on file.   Social History Main Topics  . Smoking status: Current Every Day Smoker  . Smokeless tobacco: Not on file  . Alcohol Use: Yes     Comment: Occasional  . Drug Use: No  . Sexually Active: Not on file   Other Topics Concern  . Not on file   Social History Narrative  . No narrative on file    ROS: some arthralgias but no fevers or chills, productive cough, hemoptysis, dysphasia, odynophagia, melena, hematochezia, dysuria, hematuria, rash, seizure activity, orthopnea, PND, pedal edema, claudication. Remaining systems are negative.  Physical Exam: Well-developed obese in no acute distress.  Skin is warm and dry.  HEENT is normal.  Neck is supple.  Chest is clear to auscultation with normal expansion.  Cardiovascular exam is regular rate and rhythm.  Abdominal exam nontender or distended. No masses palpated. Extremities show no edema. neuro grossly intact

## 2012-03-25 NOTE — Progress Notes (Signed)
CC:   Gwenlyn Perking, M.D. Denice Bors Stanford Breed, MD, [facility]  DIAGNOSIS:  Polycythemia vera, JAK2 positive.  CURRENT THERAPY: 1. Phlebotomy to maintain hematocrit below 45%. 2. Anagrelide 1 mg p.o. daily. 3. Aspirin 81 mg p.o. daily.  INTERIM HISTORY:  Patient comes in for followup.  Unfortunately, he has heart issues.  He has been seen by Cardiology.  He had a cardiac cath done.  He had a decreased cardiac fraction.  His cardiac ejection fraction was, I think, 35%.  He had mild nonobstructive coronary artery disease.  His medications have been readjusted.  He said he goes to the bathroom quite a bit.  I think he is on diuretics.  He is on Toprol.  He does feel very tired.  Again, he says he gets up every hour or 2 to go to the bathroom at night.  It is possible he may have some prostatism, BPH.  He is still working as a Administrator.  He is going out again, I think, tonight or tomorrow.  He has had no problem with iron.  His last ferritin in September was 12.  We have been aggressive with his phlebotomies.  He has had really very little in the way of __________.  PHYSICAL EXAMINATION:  This is an obese white gentleman in no obvious distress.  Vital signs:  97.9, pulse 79, respiratory rate 18, blood pressure 120/52.  Weight is 283.  Head/neck:  Normocephalic, atraumatic skull.  There are no ocular or oral lesions.  There are no palpable cervical or supraclavicular lymph nodes.  Lungs:  Clear bilaterally. Cardiac:  Regular rate and rhythm with a normal S1 and S2.  There are no murmurs, rubs, or bruits.  Abdomen:  Soft with good bowel sounds.  There is no palpable abdominal mass.  There is no palpable hepatosplenomegaly. Back:  No tenderness over the spine, ribs, or hips.  Extremities:  No clubbing, cyanosis or edema.  Neurological:  No focal neurological deficits.  LABORATORY STUDIES:  White cell count is 11.4, hemoglobin 14.6, hematocrit 45.4, platelet count 576.  IMPRESSION:   Mr. Dilauro is a 61 year old gentleman with polycythemia. We will go ahead and phlebotomize him today.  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  To me, I think one of his issues is going to be sleep apnea.  I talked to him about this.  He is not interested in being tested.  He says if he has it, he is not going to wear the mask, as he is claustrophobic.  I tried to reassure him that they have a new apparatuses that are more patient-friendly and much more tolerable.  Again, I think that him being tired is clearly a reflection of him having sleep apnea and also getting every hour to urinate.  We will go ahead and phlebotomize him.  As for when he comes back, he will let us know.    ______________________________ Volanda Napoleon, M.D. PRE/MEDQ  D:  03/25/2012  T:  03/25/2012  Job:  [id number]

## 2012-05-06 ENCOUNTER — Telehealth: Payer: Self-pay | Admitting: Hematology & Oncology

## 2012-05-06 NOTE — Telephone Encounter (Signed)
Patient called and sch lab apt for 05/09/12

## 2012-05-09 ENCOUNTER — Ambulatory Visit (HOSPITAL_BASED_OUTPATIENT_CLINIC_OR_DEPARTMENT_OTHER): Payer: 59

## 2012-05-09 ENCOUNTER — Encounter: Payer: Self-pay | Admitting: *Deleted

## 2012-05-09 ENCOUNTER — Ambulatory Visit (HOSPITAL_BASED_OUTPATIENT_CLINIC_OR_DEPARTMENT_OTHER): Payer: 59 | Admitting: Lab

## 2012-05-09 VITALS — BP 121/65 | HR 72 | Temp 95.9°F | Resp 20

## 2012-05-09 DIAGNOSIS — D45 Polycythemia vera: Secondary | ICD-10-CM

## 2012-05-09 DIAGNOSIS — D751 Secondary polycythemia: Secondary | ICD-10-CM

## 2012-05-09 LAB — CBC WITH DIFFERENTIAL (CANCER CENTER ONLY)
BASO#: 0.2 10*3/uL (ref 0.0–0.2)
BASO%: 2 % (ref 0.0–2.0)
EOS%: 4.5 % (ref 0.0–7.0)
Eosinophils Absolute: 0.5 10*3/uL (ref 0.0–0.5)
HCT: 45.6 % (ref 38.7–49.9)
HGB: 14.4 g/dL (ref 13.0–17.1)
LYMPH#: 2.7 10*3/uL (ref 0.9–3.3)
LYMPH%: 25.8 % (ref 14.0–48.0)
MCH: 25.2 pg — ABNORMAL LOW (ref 28.0–33.4)
MCHC: 31.6 g/dL — ABNORMAL LOW (ref 32.0–35.9)
MCV: 80 fL — ABNORMAL LOW (ref 82–98)
MONO#: 0.8 10*3/uL (ref 0.1–0.9)
MONO%: 8 % (ref 0.0–13.0)
NEUT#: 6.2 10*3/uL (ref 1.5–6.5)
NEUT%: 59.7 % (ref 40.0–80.0)
Platelets: 607 10*3/uL — ABNORMAL HIGH (ref 145–400)
RBC: 5.72 10*6/uL — ABNORMAL HIGH (ref 4.20–5.70)
RDW: 16 % — ABNORMAL HIGH (ref 11.1–15.7)
WBC: 10.3 10*3/uL — ABNORMAL HIGH (ref 4.0–10.0)

## 2012-05-09 LAB — FERRITIN: Ferritin: 6 ng/mL — ABNORMAL LOW (ref 22–322)

## 2012-05-09 NOTE — Progress Notes (Signed)
William Villanueva presents today for phlebotomy per MD orders. Phlebotomy procedure started at 1010 and ended at 1025. 450 ml removed. Patient observed for 30 minutes after procedure without any incident. Patient tolerated procedure well. IV needle removed intact.  Pt tolerated well.  Refreshments taken well.

## 2012-06-19 ENCOUNTER — Telehealth: Payer: Self-pay | Admitting: Hematology & Oncology

## 2012-06-19 NOTE — Telephone Encounter (Signed)
Patient's wife called and sch lab apt for 06/24/12

## 2012-06-24 ENCOUNTER — Ambulatory Visit (HOSPITAL_BASED_OUTPATIENT_CLINIC_OR_DEPARTMENT_OTHER): Payer: 59

## 2012-06-24 ENCOUNTER — Other Ambulatory Visit (HOSPITAL_BASED_OUTPATIENT_CLINIC_OR_DEPARTMENT_OTHER): Payer: 59 | Admitting: Lab

## 2012-06-24 ENCOUNTER — Ambulatory Visit (HOSPITAL_COMMUNITY): Payer: 59 | Attending: Cardiology | Admitting: Radiology

## 2012-06-24 DIAGNOSIS — I080 Rheumatic disorders of both mitral and aortic valves: Secondary | ICD-10-CM | POA: Insufficient documentation

## 2012-06-24 DIAGNOSIS — D45 Polycythemia vera: Secondary | ICD-10-CM

## 2012-06-24 DIAGNOSIS — I10 Essential (primary) hypertension: Secondary | ICD-10-CM | POA: Insufficient documentation

## 2012-06-24 DIAGNOSIS — I428 Other cardiomyopathies: Secondary | ICD-10-CM | POA: Insufficient documentation

## 2012-06-24 DIAGNOSIS — E669 Obesity, unspecified: Secondary | ICD-10-CM | POA: Insufficient documentation

## 2012-06-24 LAB — FERRITIN: Ferritin: 8 ng/mL — ABNORMAL LOW (ref 22–322)

## 2012-06-24 LAB — CBC WITH DIFFERENTIAL (CANCER CENTER ONLY)
BASO#: 0.2 10*3/uL (ref 0.0–0.2)
BASO%: 1.4 % (ref 0.0–2.0)
EOS%: 3 % (ref 0.0–7.0)
Eosinophils Absolute: 0.4 10*3/uL (ref 0.0–0.5)
HCT: 45.9 % (ref 38.7–49.9)
HGB: 14.3 g/dL (ref 13.0–17.1)
LYMPH#: 2.8 10*3/uL (ref 0.9–3.3)
LYMPH%: 19.6 % (ref 14.0–48.0)
MCH: 24.3 pg — ABNORMAL LOW (ref 28.0–33.4)
MCHC: 31.2 g/dL — ABNORMAL LOW (ref 32.0–35.9)
MCV: 78 fL — ABNORMAL LOW (ref 82–98)
MONO#: 1.8 10*3/uL — ABNORMAL HIGH (ref 0.1–0.9)
MONO%: 12.4 % (ref 0.0–13.0)
NEUT#: 9 10*3/uL — ABNORMAL HIGH (ref 1.5–6.5)
NEUT%: 63.6 % (ref 40.0–80.0)
Platelets: 721 10*3/uL — ABNORMAL HIGH (ref 145–400)
RBC: 5.88 10*6/uL — ABNORMAL HIGH (ref 4.20–5.70)
RDW: 17.2 % — ABNORMAL HIGH (ref 11.1–15.7)
WBC: 14.2 10*3/uL — ABNORMAL HIGH (ref 4.0–10.0)

## 2012-06-24 NOTE — Progress Notes (Signed)
William Villanueva presents today for phlebotomy per MD orders. Phlebotomy procedure started at 1445 and ended at 1455. 500 grams removed. Patient observed for 30 minutes after procedure without any incident. Patient tolerated procedure well. IV needle removed intact.

## 2012-06-24 NOTE — Patient Instructions (Signed)

## 2012-06-24 NOTE — Progress Notes (Signed)
Echocardiogram performed.  

## 2012-07-11 ENCOUNTER — Telehealth: Payer: Self-pay | Admitting: Cardiology

## 2012-07-11 NOTE — Telephone Encounter (Signed)
Spoke with pt, aware he will need to come into the office to be seen. He will need EKG.

## 2012-07-11 NOTE — Telephone Encounter (Signed)
New problem   Pt was wondering because of work can he have his office visit over phone. Pt would like for you to ask Dr Stanford Breed about this matter

## 2012-07-22 ENCOUNTER — Ambulatory Visit (INDEPENDENT_AMBULATORY_CARE_PROVIDER_SITE_OTHER): Payer: 59 | Admitting: Cardiology

## 2012-07-22 ENCOUNTER — Encounter: Payer: Self-pay | Admitting: Cardiology

## 2012-07-22 VITALS — BP 129/77 | HR 75 | Ht 70.0 in | Wt 282.0 lb

## 2012-07-22 DIAGNOSIS — Z720 Tobacco use: Secondary | ICD-10-CM

## 2012-07-22 DIAGNOSIS — F17200 Nicotine dependence, unspecified, uncomplicated: Secondary | ICD-10-CM

## 2012-07-22 DIAGNOSIS — I429 Cardiomyopathy, unspecified: Secondary | ICD-10-CM

## 2012-07-22 DIAGNOSIS — I428 Other cardiomyopathies: Secondary | ICD-10-CM

## 2012-07-22 DIAGNOSIS — I420 Dilated cardiomyopathy: Secondary | ICD-10-CM

## 2012-07-22 NOTE — Patient Instructions (Addendum)
Your physician wants you to follow-up in: ONE YEAR WITH DR CRENSHAW You will receive a reminder letter in the mail two months in advance. If you don't receive a letter, please call our office to schedule the follow-up appointment.  

## 2012-07-22 NOTE — Assessment & Plan Note (Signed)
LV function improved on most recent echocardiogram with an ejection fraction of 45%. Continue ACE inhibitor and beta blocker. Patient has a nonischemic cardiomyopathy. Previous catheterization did not reveal obstructive coronary disease and previous cardiac MRI did not show iron deposits.

## 2012-07-22 NOTE — Progress Notes (Signed)
   HPI: Pleasant male for fu of cardiomyopathy. Echocardiogram in August of 2013 showed an ejection fraction of 35-40%. There was diffuse hypokinesis. Moderate left ventricular hypertrophy. The left atrium was mild to moderately dilated. Exercise treadmill in August of 2013 was negative. Cardiac catheterization in September of 2013 showed mild nonobstructive coronary disease. There was no hemodynamic evidence of restriction. Pulmonary capillary wedge pressure 10. Cardiac MRI in September of 2013 showed an ejection fraction of 34% with diffuse hypokinesis. There was no hyperenhancement or scar tissue and no evidence of cardiac hemochromatosis. There was moderate left atrial enlargement. TSH in September 2013 normal. Echo repeated in Feb 2014 and showed EF 45. Since I last saw him in Nov 2013, he has some dyspnea on exertion but no orthopnea, PND, pedal edema, palpitations, syncope or chest pain.   Current Outpatient Prescriptions  Medication Sig Dispense Refill  . allopurinol (ZYLOPRIM) 300 MG tablet Take 1 tablet by mouth Daily.      [name] anagrelide (AGRYLIN) 0.5 MG capsule Take 2 capsules (1 mg total) by mouth daily.  180 capsule  0  . aspirin 81 MG tablet Take 81 mg by mouth daily.      [name] lisinopril (PRINIVIL,ZESTRIL) 40 MG tablet Take 1 tablet (40 mg total) by mouth daily.  30 tablet  12  . metoprolol succinate (TOPROL-XL) 50 MG 24 hr tablet Take 1 tablet (50 mg total) by mouth daily.  90 tablet  3  . ofloxacin (FLOXIN) 0.3 % otic solution 5 drops as needed.      . tamsulosin (FLOMAX) 0.4 MG CAPS Take 0.4 mg by mouth daily.       No current facility-administered medications for this visit.     Past Medical History  Diagnosis Date  . Polycythemia vera   . Back pain   . Hemochromatosis   . Gout   . Hypertension   . Nephrolithiasis   . Cardiomyopathy     Past Surgical History  Procedure Laterality Date  . Ankle surgery    . Pilonidal cyst excision    . Tonsillectomy      History    Social History  . Marital Status: Married    Spouse Name: N/A    Number of Children: N/A  . Years of Education: N/A   Occupational History  . Not on file.   Social History Main Topics  . Smoking status: Current Every Day Smoker  . Smokeless tobacco: Not on file  . Alcohol Use: Yes     Comment: Occasional  . Drug Use: No  . Sexually Active: Not on file   Other Topics Concern  . Not on file   Social History Narrative  . No narrative on file    ROS: arthralgias but no fevers or chills, productive cough, hemoptysis, dysphasia, odynophagia, melena, hematochezia, dysuria, hematuria, rash, seizure activity, orthopnea, PND, pedal edema, claudication. Remaining systems are negative.  Physical Exam: Well-developed obese in no acute distress.  Skin is warm and dry.  HEENT is normal.  Neck is supple.  Chest is clear to auscultation with normal expansion.  Cardiovascular exam is regular rate and rhythm.  Abdominal exam nontender or distended. No masses palpated. Extremities show no edema. neuro grossly intact  ECG sinus rhythm, right bundle branch block, left anterior fascicular block.

## 2012-07-22 NOTE — Assessment & Plan Note (Signed)
Patient counseled on discontinuing. 

## 2012-08-14 ENCOUNTER — Telehealth: Payer: Self-pay | Admitting: Hematology & Oncology

## 2012-08-14 NOTE — Telephone Encounter (Signed)
Pt made 4-14 lab and phlebotomy appointments. RN aware and will see if he needs MD appointment

## 2012-08-19 ENCOUNTER — Ambulatory Visit (HOSPITAL_BASED_OUTPATIENT_CLINIC_OR_DEPARTMENT_OTHER): Payer: 59

## 2012-08-19 ENCOUNTER — Other Ambulatory Visit (HOSPITAL_BASED_OUTPATIENT_CLINIC_OR_DEPARTMENT_OTHER): Payer: 59 | Admitting: Lab

## 2012-08-19 DIAGNOSIS — D45 Polycythemia vera: Secondary | ICD-10-CM

## 2012-08-19 LAB — CBC WITH DIFFERENTIAL (CANCER CENTER ONLY)
BASO#: 0.2 10*3/uL (ref 0.0–0.2)
BASO%: 1.7 % (ref 0.0–2.0)
EOS%: 4 % (ref 0.0–7.0)
Eosinophils Absolute: 0.5 10*3/uL (ref 0.0–0.5)
HCT: 44.7 % (ref 38.7–49.9)
HGB: 13.8 g/dL (ref 13.0–17.1)
LYMPH#: 3.1 10*3/uL (ref 0.9–3.3)
LYMPH%: 26.3 % (ref 14.0–48.0)
MCH: 23.7 pg — ABNORMAL LOW (ref 28.0–33.4)
MCHC: 30.9 g/dL — ABNORMAL LOW (ref 32.0–35.9)
MCV: 77 fL — ABNORMAL LOW (ref 82–98)
MONO#: 1.2 10*3/uL — ABNORMAL HIGH (ref 0.1–0.9)
MONO%: 10.4 % (ref 0.0–13.0)
NEUT#: 6.7 10*3/uL — ABNORMAL HIGH (ref 1.5–6.5)
NEUT%: 57.6 % (ref 40.0–80.0)
Platelets: 710 10*3/uL — ABNORMAL HIGH (ref 145–400)
RBC: 5.83 10*6/uL — ABNORMAL HIGH (ref 4.20–5.70)
RDW: 18.6 % — ABNORMAL HIGH (ref 11.1–15.7)
WBC: 11.6 10*3/uL — ABNORMAL HIGH (ref 4.0–10.0)

## 2012-08-19 LAB — FERRITIN: Ferritin: 5 ng/mL — ABNORMAL LOW (ref 22–322)

## 2012-08-19 LAB — CHCC SATELLITE - SMEAR

## 2012-08-19 NOTE — Patient Instructions (Addendum)

## 2012-08-19 NOTE — Progress Notes (Signed)
William Villanueva presents today for phlebotomy per MD orders. Phlebotomy procedure started at 1440 and ended at 1450. 500 grams removed. Patient observed for 30 minutes after procedure without any incident. Patient tolerated procedure well. IV needle removed intact.

## 2012-10-11 ENCOUNTER — Other Ambulatory Visit: Payer: Self-pay | Admitting: Hematology & Oncology

## 2012-10-11 ENCOUNTER — Other Ambulatory Visit (HOSPITAL_BASED_OUTPATIENT_CLINIC_OR_DEPARTMENT_OTHER): Payer: 59 | Admitting: Lab

## 2012-10-11 DIAGNOSIS — D45 Polycythemia vera: Secondary | ICD-10-CM

## 2012-10-11 LAB — CBC WITH DIFFERENTIAL (CANCER CENTER ONLY)
BASO#: 0.2 10*3/uL (ref 0.0–0.2)
BASO%: 1.7 % (ref 0.0–2.0)
EOS%: 3.2 % (ref 0.0–7.0)
Eosinophils Absolute: 0.4 10*3/uL (ref 0.0–0.5)
HCT: 43.7 % (ref 38.7–49.9)
HGB: 13.6 g/dL (ref 13.0–17.1)
LYMPH#: 2.4 10*3/uL (ref 0.9–3.3)
LYMPH%: 19.6 % (ref 14.0–48.0)
MCH: 24 pg — ABNORMAL LOW (ref 28.0–33.4)
MCHC: 31.1 g/dL — ABNORMAL LOW (ref 32.0–35.9)
MCV: 77 fL — ABNORMAL LOW (ref 82–98)
MONO#: 1.3 10*3/uL — ABNORMAL HIGH (ref 0.1–0.9)
MONO%: 10.3 % (ref 0.0–13.0)
NEUT#: 8.1 10*3/uL — ABNORMAL HIGH (ref 1.5–6.5)
NEUT%: 65.2 % (ref 40.0–80.0)
Platelets: 721 10*3/uL — ABNORMAL HIGH (ref 145–400)
RBC: 5.67 10*6/uL (ref 4.20–5.70)
RDW: 18.9 % — ABNORMAL HIGH (ref 11.1–15.7)
WBC: 12.3 10*3/uL — ABNORMAL HIGH (ref 4.0–10.0)

## 2012-10-11 LAB — FERRITIN: Ferritin: 7 ng/mL — ABNORMAL LOW (ref 22–322)

## 2012-10-16 ENCOUNTER — Telehealth: Payer: Self-pay | Admitting: Hematology & Oncology

## 2012-10-16 NOTE — Telephone Encounter (Signed)
Faxed Medical Records via fax today to: [place]  [address]       Ph: [phone]       Fx: [phone]   Medical  Records requested from 5 years, per [facility].   CONSENT COPY SCANNED

## 2012-11-22 ENCOUNTER — Ambulatory Visit (HOSPITAL_BASED_OUTPATIENT_CLINIC_OR_DEPARTMENT_OTHER): Payer: 59

## 2012-11-22 ENCOUNTER — Other Ambulatory Visit: Payer: Self-pay | Admitting: *Deleted

## 2012-11-22 ENCOUNTER — Other Ambulatory Visit (HOSPITAL_BASED_OUTPATIENT_CLINIC_OR_DEPARTMENT_OTHER): Payer: 59 | Admitting: Lab

## 2012-11-22 DIAGNOSIS — D45 Polycythemia vera: Secondary | ICD-10-CM

## 2012-11-22 LAB — CBC WITH DIFFERENTIAL (CANCER CENTER ONLY)
BASO#: 0.2 10*3/uL (ref 0.0–0.2)
BASO%: 1.4 % (ref 0.0–2.0)
EOS%: 1.9 % (ref 0.0–7.0)
Eosinophils Absolute: 0.3 10*3/uL (ref 0.0–0.5)
HCT: 49.4 % (ref 38.7–49.9)
HGB: 15.5 g/dL (ref 13.0–17.1)
LYMPH#: 2.6 10*3/uL (ref 0.9–3.3)
LYMPH%: 16.2 % (ref 14.0–48.0)
MCH: 23.9 pg — ABNORMAL LOW (ref 28.0–33.4)
MCHC: 31.4 g/dL — ABNORMAL LOW (ref 32.0–35.9)
MCV: 76 fL — ABNORMAL LOW (ref 82–98)
MONO#: 1.6 10*3/uL — ABNORMAL HIGH (ref 0.1–0.9)
MONO%: 10.2 % (ref 0.0–13.0)
NEUT#: 11.4 10*3/uL — ABNORMAL HIGH (ref 1.5–6.5)
NEUT%: 70.3 % (ref 40.0–80.0)
Platelets: 740 10*3/uL — ABNORMAL HIGH (ref 145–400)
RBC: 6.48 10*6/uL — ABNORMAL HIGH (ref 4.20–5.70)
RDW: 20.8 % — ABNORMAL HIGH (ref 11.1–15.7)
WBC: 16.1 10*3/uL — ABNORMAL HIGH (ref 4.0–10.0)

## 2012-11-22 LAB — TECHNOLOGIST REVIEW CHCC SATELLITE

## 2012-11-22 NOTE — Patient Instructions (Signed)
Therapeutic Phlebotomy Therapeutic phlebotomy is the controlled removal of blood from your body for the purpose of treating a medical condition. It is similar to donating blood. Usually, about a pint (470 mL) of blood is removed. The average adult has 9 to 12 pints (4.3 to 5.7 L) of blood. Therapeutic phlebotomy may be used to treat the following medical conditions:  Hemochromatosis. This is a condition in which there is too much iron in the blood.  Polycythemia vera. This is a condition in which there are too many red cells in the blood.  Porphyria cutanea tarda. This is a disease usually passed from one generation to the next (inherited). It is a condition in which an important part of hemoglobin is not made properly. This results in the build up of abnormal amounts of porphyrins in the body.  Sickle cell disease. This is an inherited disease. It is a condition in which the red blood cells form an abnormal crescent shape rather than a round shape. LET YOUR CAREGIVER KNOW ABOUT:  Allergies.  Medicines taken including herbs, eyedrops, over-the-counter medicines, and creams.  Use of steroids (by mouth or creams).  Previous problems with anesthetics or numbing medicine.  History of blood clots.  History of bleeding or blood problems.  Previous surgery.  Possibility of pregnancy, if this applies. RISKS AND COMPLICATIONS This is a simple and safe procedure. Problems are unlikely. However, problems can occur and may include:  Nausea or lightheadedness.  Low blood pressure.  Soreness, bleeding, swelling, or bruising at the needle insertion site.  Infection. BEFORE THE PROCEDURE  This is a procedure that can be done as an outpatient. Confirm the time that you need to arrive for your procedure. Confirm whether there is a need to fast or withhold any medications. It is helpful to wear clothing with sleeves that can be raised above the elbow. A blood sample may be done to determine the  amount of red blood cells or iron in your blood. Plan ahead of time to have someone drive you home after the procedure. PROCEDURE The entire procedure from preparation through recovery takes about 1 hour. The actual collection takes about 10 to 15 minutes.  A needle will be inserted into your vein.  Tubing and a collection bag will be attached to that needle.  Blood will flow through the needle and tubing into the collection bag.  You may be asked to open and close your hand slowly and continuously during the entire collection.  Once the specified amount of blood has been removed from your body, the collection bag and tubing will be clamped.  The needle will be removed.  Pressure will be held on the site of the needle insertion to stop the bleeding. Then a bandage will be placed over the needle insertion site. AFTER THE PROCEDURE  Your recovery will be assessed and monitored. If there are no problems, as an outpatient, you should be able to go home shortly after the procedure.  Document Released: 09/26/2010 Document Revised: 07/17/2011 Document Reviewed: 09/26/2010 [employer] Patient Information 2014 [employer].  

## 2012-11-22 NOTE — Progress Notes (Signed)
Trey Fodor presents today for phlebotomy per MD orders. Per Hoy Finlay  Phlebotomy procedure started at 1354 and ended at 1401. 500 grams removed. Patient observed for 30 minutes after procedure without any incident. Patient tolerated procedure well. IV needle removed intact.

## 2013-02-13 ENCOUNTER — Other Ambulatory Visit (HOSPITAL_BASED_OUTPATIENT_CLINIC_OR_DEPARTMENT_OTHER): Payer: 59 | Admitting: Lab

## 2013-02-13 ENCOUNTER — Encounter: Payer: Self-pay | Admitting: *Deleted

## 2013-02-13 ENCOUNTER — Other Ambulatory Visit: Payer: Self-pay | Admitting: Hematology & Oncology

## 2013-02-13 ENCOUNTER — Telehealth: Payer: Self-pay | Admitting: Hematology & Oncology

## 2013-02-13 ENCOUNTER — Ambulatory Visit (HOSPITAL_BASED_OUTPATIENT_CLINIC_OR_DEPARTMENT_OTHER): Payer: 59

## 2013-02-13 DIAGNOSIS — D45 Polycythemia vera: Secondary | ICD-10-CM

## 2013-02-13 LAB — CBC WITH DIFFERENTIAL (CANCER CENTER ONLY)
BASO#: 0.3 10*3/uL — ABNORMAL HIGH (ref 0.0–0.2)
BASO%: 2.4 % — ABNORMAL HIGH (ref 0.0–2.0)
EOS%: 2.2 % (ref 0.0–7.0)
Eosinophils Absolute: 0.3 10*3/uL (ref 0.0–0.5)
HCT: 48.5 % (ref 38.7–49.9)
HGB: 15.4 g/dL (ref 13.0–17.1)
LYMPH#: 2.5 10*3/uL (ref 0.9–3.3)
LYMPH%: 20.8 % (ref 14.0–48.0)
MCH: 25.1 pg — ABNORMAL LOW (ref 28.0–33.4)
MCHC: 31.8 g/dL — ABNORMAL LOW (ref 32.0–35.9)
MCV: 79 fL — ABNORMAL LOW (ref 82–98)
MONO#: 1.4 10*3/uL — ABNORMAL HIGH (ref 0.1–0.9)
MONO%: 11.4 % (ref 0.0–13.0)
NEUT#: 7.5 10*3/uL — ABNORMAL HIGH (ref 1.5–6.5)
NEUT%: 63.2 % (ref 40.0–80.0)
Platelets: 656 10*3/uL — ABNORMAL HIGH (ref 145–400)
RBC: 6.14 10*6/uL — ABNORMAL HIGH (ref 4.20–5.70)
RDW: 19.4 % — ABNORMAL HIGH (ref 11.1–15.7)
WBC: 11.9 10*3/uL — ABNORMAL HIGH (ref 4.0–10.0)

## 2013-02-13 NOTE — Progress Notes (Signed)
William Villanueva presents today for phlebotomy per MD orders. Phlebotomy procedure started at 1345 and ended at 1400. 500 mls removed. Patient observed for 30 minutes after procedure without any incident. Patient tolerated procedure well. IV needle removed intact.

## 2013-02-13 NOTE — Telephone Encounter (Signed)
Pt called scheduled lab for today. MD aware will put in orders

## 2013-04-28 ENCOUNTER — Other Ambulatory Visit: Payer: Self-pay | Admitting: Hematology & Oncology

## 2013-05-08 DIAGNOSIS — I441 Atrioventricular block, second degree: Secondary | ICD-10-CM

## 2013-05-08 HISTORY — DX: Atrioventricular block, second degree: I44.1

## 2013-05-12 ENCOUNTER — Telehealth: Payer: Self-pay | Admitting: Hematology & Oncology

## 2013-05-12 NOTE — Telephone Encounter (Signed)
Pt made 05-16-13 appointment

## 2013-05-16 ENCOUNTER — Ambulatory Visit (HOSPITAL_BASED_OUTPATIENT_CLINIC_OR_DEPARTMENT_OTHER): Payer: 59 | Admitting: Hematology & Oncology

## 2013-05-16 ENCOUNTER — Emergency Department (HOSPITAL_BASED_OUTPATIENT_CLINIC_OR_DEPARTMENT_OTHER)
Admission: EM | Admit: 2013-05-16 | Discharge: 2013-05-16 | Disposition: A | Discharge status: Home / Self Care | Payer: 59 | Attending: Emergency Medicine | Admitting: Emergency Medicine

## 2013-05-16 ENCOUNTER — Ambulatory Visit (HOSPITAL_BASED_OUTPATIENT_CLINIC_OR_DEPARTMENT_OTHER): Payer: 59

## 2013-05-16 ENCOUNTER — Other Ambulatory Visit (HOSPITAL_BASED_OUTPATIENT_CLINIC_OR_DEPARTMENT_OTHER): Payer: 59 | Admitting: Lab

## 2013-05-16 ENCOUNTER — Encounter (HOSPITAL_BASED_OUTPATIENT_CLINIC_OR_DEPARTMENT_OTHER): Payer: Self-pay | Admitting: Emergency Medicine

## 2013-05-16 VITALS — BP 128/55 | HR 74 | Temp 97.4°F | Resp 18 | Ht 70.0 in | Wt 298.0 lb

## 2013-05-16 VITALS — BP 100/53 | HR 54 | Resp 20

## 2013-05-16 DIAGNOSIS — D473 Essential (hemorrhagic) thrombocythemia: Secondary | ICD-10-CM

## 2013-05-16 DIAGNOSIS — R110 Nausea: Secondary | ICD-10-CM

## 2013-05-16 DIAGNOSIS — M79604 Pain in right leg: Secondary | ICD-10-CM

## 2013-05-16 DIAGNOSIS — Z8639 Personal history of other endocrine, nutritional and metabolic disease: Secondary | ICD-10-CM | POA: Insufficient documentation

## 2013-05-16 DIAGNOSIS — F411 Generalized anxiety disorder: Secondary | ICD-10-CM | POA: Insufficient documentation

## 2013-05-16 DIAGNOSIS — F17200 Nicotine dependence, unspecified, uncomplicated: Secondary | ICD-10-CM | POA: Insufficient documentation

## 2013-05-16 DIAGNOSIS — I428 Other cardiomyopathies: Secondary | ICD-10-CM | POA: Insufficient documentation

## 2013-05-16 DIAGNOSIS — Z79899 Other long term (current) drug therapy: Secondary | ICD-10-CM | POA: Insufficient documentation

## 2013-05-16 DIAGNOSIS — Z862 Personal history of diseases of the blood and blood-forming organs and certain disorders involving the immune mechanism: Secondary | ICD-10-CM | POA: Diagnosis not present

## 2013-05-16 DIAGNOSIS — R0602 Shortness of breath: Secondary | ICD-10-CM | POA: Diagnosis not present

## 2013-05-16 DIAGNOSIS — M545 Low back pain, unspecified: Secondary | ICD-10-CM | POA: Diagnosis not present

## 2013-05-16 DIAGNOSIS — M549 Dorsalgia, unspecified: Secondary | ICD-10-CM

## 2013-05-16 DIAGNOSIS — E669 Obesity, unspecified: Secondary | ICD-10-CM | POA: Diagnosis not present

## 2013-05-16 DIAGNOSIS — Z87442 Personal history of urinary calculi: Secondary | ICD-10-CM | POA: Diagnosis not present

## 2013-05-16 DIAGNOSIS — F419 Anxiety disorder, unspecified: Secondary | ICD-10-CM

## 2013-05-16 DIAGNOSIS — R61 Generalized hyperhidrosis: Secondary | ICD-10-CM | POA: Diagnosis not present

## 2013-05-16 DIAGNOSIS — R55 Syncope and collapse: Secondary | ICD-10-CM | POA: Diagnosis not present

## 2013-05-16 DIAGNOSIS — D45 Polycythemia vera: Secondary | ICD-10-CM

## 2013-05-16 DIAGNOSIS — Z7982 Long term (current) use of aspirin: Secondary | ICD-10-CM | POA: Diagnosis not present

## 2013-05-16 DIAGNOSIS — I10 Essential (primary) hypertension: Secondary | ICD-10-CM | POA: Insufficient documentation

## 2013-05-16 DIAGNOSIS — R079 Chest pain, unspecified: Secondary | ICD-10-CM | POA: Insufficient documentation

## 2013-05-16 DIAGNOSIS — F410 Panic disorder [episodic paroxysmal anxiety] without agoraphobia: Secondary | ICD-10-CM

## 2013-05-16 DIAGNOSIS — M109 Gout, unspecified: Secondary | ICD-10-CM | POA: Diagnosis not present

## 2013-05-16 DIAGNOSIS — E860 Dehydration: Secondary | ICD-10-CM

## 2013-05-16 LAB — CBC WITH DIFFERENTIAL/PLATELET
Basophils Absolute: 0.2 10*3/uL — ABNORMAL HIGH (ref 0.0–0.1)
Basophils Relative: 2 % — ABNORMAL HIGH (ref 0–1)
Eosinophils Absolute: 0.3 10*3/uL (ref 0.0–0.7)
Eosinophils Relative: 3 % (ref 0–5)
HCT: 46.5 % (ref 39.0–52.0)
Hemoglobin: 14.7 g/dL (ref 13.0–17.0)
Lymphocytes Relative: 20 % (ref 12–46)
Lymphs Abs: 2.2 10*3/uL (ref 0.7–4.0)
MCH: 24.8 pg — ABNORMAL LOW (ref 26.0–34.0)
MCHC: 31.6 g/dL (ref 30.0–36.0)
MCV: 78.4 fL (ref 78.0–100.0)
Monocytes Absolute: 1 10*3/uL (ref 0.1–1.0)
Monocytes Relative: 9 % (ref 3–12)
Neutro Abs: 7.3 10*3/uL (ref 1.7–7.7)
Neutrophils Relative %: 67 % (ref 43–77)
Platelets: 617 10*3/uL — ABNORMAL HIGH (ref 150–400)
RBC: 5.93 MIL/uL — ABNORMAL HIGH (ref 4.22–5.81)
RDW: 17.8 % — ABNORMAL HIGH (ref 11.5–15.5)
WBC: 11 10*3/uL — ABNORMAL HIGH (ref 4.0–10.5)

## 2013-05-16 LAB — COMPREHENSIVE METABOLIC PANEL
ALT: 24 U/L (ref 0–53)
ALT: 29 U/L (ref 0–53)
AST: 24 U/L (ref 0–37)
AST: 24 U/L (ref 0–37)
Albumin: 3.8 g/dL (ref 3.5–5.2)
Albumin: 3.6 g/dL (ref 3.5–5.2)
Alkaline Phosphatase: 76 U/L (ref 39–117)
Alkaline Phosphatase: 76 U/L (ref 39–117)
BUN: 16 mg/dL (ref 6–23)
BUN: 17 mg/dL (ref 6–23)
CO2: 23 mEq/L (ref 19–32)
CO2: 22 mEq/L (ref 19–32)
Calcium: 9.6 mg/dL (ref 8.4–10.5)
Calcium: 8.9 mg/dL (ref 8.4–10.5)
Chloride: 104 mEq/L (ref 96–112)
Chloride: 103 mEq/L (ref 96–112)
Creatinine, Ser: 1.27 mg/dL (ref 0.50–1.35)
Creatinine, Ser: 1.2 mg/dL (ref 0.50–1.35)
GFR calc Af Amer: 74 mL/min — ABNORMAL LOW (ref >90)
GFR calc non Af Amer: 64 mL/min — ABNORMAL LOW (ref >90)
Glucose, Bld: 193 mg/dL — ABNORMAL HIGH (ref 70–99)
Glucose, Bld: 134 mg/dL — ABNORMAL HIGH (ref 70–99)
Potassium: 5.3 mEq/L (ref 3.5–5.3)
Potassium: 4.1 mEq/L (ref 3.7–5.3)
Sodium: 140 mEq/L (ref 135–145)
Sodium: 139 mEq/L (ref 137–147)
Total Bilirubin: 0.5 mg/dL (ref 0.3–1.2)
Total Bilirubin: 0.4 mg/dL (ref 0.3–1.2)
Total Protein: 6.5 g/dL (ref 6.0–8.3)
Total Protein: 6.8 g/dL (ref 6.0–8.3)

## 2013-05-16 LAB — CBC WITH DIFFERENTIAL (CANCER CENTER ONLY)
BASO#: 0.2 10*3/uL (ref 0.0–0.2)
BASO%: 2.3 % — ABNORMAL HIGH (ref 0.0–2.0)
EOS%: 3.4 % (ref 0.0–7.0)
Eosinophils Absolute: 0.3 10*3/uL (ref 0.0–0.5)
HCT: 48.5 % (ref 38.7–49.9)
HGB: 15.1 g/dL (ref 13.0–17.1)
LYMPH#: 2.2 10*3/uL (ref 0.9–3.3)
LYMPH%: 22.1 % (ref 14.0–48.0)
MCH: 24.9 pg — ABNORMAL LOW (ref 28.0–33.4)
MCHC: 31.1 g/dL — ABNORMAL LOW (ref 32.0–35.9)
MCV: 80 fL — ABNORMAL LOW (ref 82–98)
MONO#: 0.8 10*3/uL (ref 0.1–0.9)
MONO%: 7.7 % (ref 0.0–13.0)
NEUT#: 6.5 10*3/uL (ref 1.5–6.5)
NEUT%: 64.5 % (ref 40.0–80.0)
Platelets: 598 10*3/uL — ABNORMAL HIGH (ref 145–400)
RBC: 6.06 10*6/uL — ABNORMAL HIGH (ref 4.20–5.70)
RDW: 18.3 % — ABNORMAL HIGH (ref 11.1–15.7)
WBC: 10.1 10*3/uL — ABNORMAL HIGH (ref 4.0–10.0)

## 2013-05-16 LAB — TROPONIN I
Troponin I: <0.3 ng/mL (ref <0.30)
Troponin I: <0.3 ng/mL (ref <0.30)

## 2013-05-16 LAB — IRON AND TIBC CHCC
%SAT: 11 % — ABNORMAL LOW (ref 20–55)
Iron: 46 ug/dL (ref 42–163)
TIBC: 414 ug/dL — ABNORMAL HIGH (ref 202–409)
UIBC: 368 ug/dL (ref 117–376)

## 2013-05-16 LAB — FERRITIN CHCC: Ferritin: 12 ng/ml — ABNORMAL LOW (ref 22–316)

## 2013-05-16 MED ORDER — SODIUM CHLORIDE 0.9 % IV BOLUS (SEPSIS): 1000.0000 mL | Freq: Once | INTRAVENOUS | Status: DC

## 2013-05-16 MED ORDER — LORAZEPAM 2 MG/ML IJ SOLN
1.0000 mg | INTRAMUSCULAR | Status: DC | PRN
  Administered 2013-05-16: 1 mg via INTRAVENOUS
  Filled 2013-05-16: qty 1

## 2013-05-16 MED ORDER — LORAZEPAM 1 MG PO TABS
ORAL_TABLET | ORAL | Status: AC
  Filled 2013-05-16: qty 1

## 2013-05-16 MED ORDER — ONDANSETRON HCL 4 MG/2ML IJ SOLN
4.0000 mg | Freq: Once | INTRAMUSCULAR | Status: AC
  Administered 2013-05-16: 4 mg via INTRAVENOUS
  Filled 2013-05-16: qty 2

## 2013-05-16 MED ORDER — SODIUM CHLORIDE 0.9 % IV BOLUS (SEPSIS)
500.0000 mL | Freq: Once | INTRAVENOUS | Status: AC
  Administered 2013-05-16: 500 mL via INTRAVENOUS

## 2013-05-16 MED ORDER — PANTOPRAZOLE SODIUM 40 MG IV SOLR
40.0000 mg | Freq: Once | INTRAVENOUS | Status: AC
  Administered 2013-05-16: 40 mg via INTRAVENOUS
  Filled 2013-05-16: qty 40

## 2013-05-16 MED ORDER — MORPHINE SULFATE 4 MG/ML IJ SOLN
INTRAMUSCULAR | Status: AC
  Filled 2013-05-16: qty 1

## 2013-05-16 MED ORDER — DEXAMETHASONE SODIUM PHOSPHATE 20 MG/5ML IJ SOLN
INTRAMUSCULAR | Status: AC
  Filled 2013-05-16: qty 5

## 2013-05-16 MED ORDER — LORAZEPAM 1 MG PO TABS: ORAL_TABLET | ORAL | Status: DC

## 2013-05-16 MED ORDER — SODIUM CHLORIDE 0.9 % IV BOLUS (SEPSIS)
1000.0000 mL | Freq: Once | INTRAVENOUS | Status: AC
Start: 2013-05-16 — End: 2013-05-16
  Administered 2013-05-16: 1000 mL via INTRAVENOUS

## 2013-05-16 NOTE — ED Notes (Signed)
Pt sitting on side of bed eating sandwich wife brought him.

## 2013-05-16 NOTE — ED Notes (Signed)
MRI is scheduled as outpatient and for 7 pm tomorrow here . Pt and family member verbalize understanding.

## 2013-05-16 NOTE — Progress Notes (Signed)
At 1025 past phlebotomy, pt c/o nausea, chest pain that does not radiate,SOB, and "not feeling right".  No diaphoresis noted.  O2 sat was 95%.  Dr Marin Olp present.  Pt to be taken to ER for evaluation.  Pt states it is like the 2 panic attacts he has had recently.

## 2013-05-16 NOTE — Discharge Instructions (Signed)
Vasovagal Syncope, Adult °Syncope, commonly known as fainting, is a temporary loss of consciousness. It occurs when the blood flow to the brain is reduced. Vasovagal syncope (also called neurocardiogenic syncope) is a fainting spell in which the blood flow to the brain is reduced because of a sudden drop in heart rate and blood pressure. Vasovagal syncope occurs when the brain and the cardiovascular system (blood vessels) do not adequately communicate and respond to each other. This is the most common cause of fainting. It often occurs in response to fear or some other type of emotional or physical stress. The body has a reaction in which the heart starts beating too slowly or the blood vessels expand, reducing blood pressure. This type of fainting spell is generally considered harmless. However, injuries can occur if a person takes a sudden fall during a fainting spell.  °CAUSES  °Vasovagal syncope occurs when a person's blood pressure and heart rate decrease suddenly, usually in response to a trigger. Many things and situations can trigger an episode. Some of these include:  °· Pain.   °· Fear.   °· The sight of blood or medical procedures, such as blood being drawn from a vein.   °· Common activities, such as coughing, swallowing, stretching, or going to the bathroom.   °· Emotional stress.   °· Prolonged standing, especially in a warm environment.   °· Lack of sleep or rest.   °· Prolonged lack of food.   °· Prolonged lack of fluids.   °· Recent illness. °· The use of certain drugs that affect blood pressure, such as cocaine, alcohol, marijuana, inhalants, and opiates.   °SYMPTOMS  °Before the fainting episode, you may:  °· Feel dizzy or light headed.   °· Become pale. °· Sense that you are going to faint.   °· Feel like the room is spinning.   °· Have tunnel vision, only seeing directly in front of you.   °· Feel sick to your stomach (nauseous).   °· See spots or slowly lose vision.   °· Hear ringing in your  ears.   °· Have a headache.   °· Feel warm and sweaty.   °· Feel a sensation of pins and needles. °During the fainting spell, you will generally be unconscious for no longer than a couple minutes before waking up and returning to normal. If you get up too quickly before your body can recover, you may faint again. Some twitching or jerky movements may occur during the fainting spell.  °DIAGNOSIS  °Your caregiver will ask about your symptoms, take a medical history, and perform a physical exam. Various tests may be done to rule out other causes of fainting. These may include blood tests and tests to check the heart, such as electrocardiography, echocardiography, and possibly an electrophysiology study. When other causes have been ruled out, a test may be done to check the body's response to changes in position (tilt table test). °TREATMENT  °Most cases of vasovagal syncope do not require treatment. Your caregiver may recommend ways to avoid fainting triggers and may provide home strategies for preventing fainting. If you must be exposed to a possible trigger, you can drink additional fluids to help reduce your chances of having an episode of vasovagal syncope. If you have warning signs of an oncoming episode, you can respond by positioning yourself favorably (lying down). °If your fainting spells continue, you may be given medicines to prevent fainting. Some medicines may help make you more resistant to repeated episodes of vasovagal syncope. Special exercises or compression stockings may be recommended. In rare cases, the surgical placement   of a pacemaker is considered. HOME CARE INSTRUCTIONS   Learn to identify the warning signs of vasovagal syncope.   Sit or lie down at the first warning sign of a fainting spell. If sitting, put your head down between your legs. If you lie down, swing your legs up in the air to increase blood flow to the brain.   Avoid hot tubs and saunas.  Avoid prolonged  standing.  Drink enough fluids to keep your urine clear or pale yellow. Avoid caffeine.  Increase salt in your diet as directed by your caregiver.   If you have to stand for a long time, perform movements such as:   Crossing your legs.   Flexing and stretching your leg muscles.   Squatting.   Moving your legs.   Bending over.   Only take over-the-counter or prescription medicines as directed by your caregiver. Do not suddenly stop any medicines without asking your caregiver first. SEEK MEDICAL CARE IF:   Your fainting spells continue or happen more frequently in spite of treatment.   You lose consciousness for more than a couple minutes.  You have fainting spells during or after exercising or after being startled.   You have new symptoms that occur with the fainting spells, such as:   Shortness of breath.  Chest pain.   Irregular heartbeat.   You have episodes of twitching or jerky movements that last longer than a few seconds.  You have episodes of twitching or jerky movements without obvious fainting. SEEK IMMEDIATE MEDICAL CARE IF:   You have injuries or bleeding after a fainting spell.   You have episodes of twitching or jerky movements that last longer than 5 minutes.   You have more than one spell of twitching or jerky movements before returning to consciousness after fainting. MAKE SURE YOU:   Understand these instructions.  Will watch your condition.  Will get help right away if you are not doing well or get worse. Document Released: 04/10/2012 Document Reviewed: 04/10/2012 [employer] Patient Information 2014 [place], [place].  Anxiety and Panic Attacks Anxiety is your body's way of reacting to real danger or something you think is a danger. It may be fear or worry over a situation like losing your job. Sometimes the cause is not known. A panic attack is made up of physical signs like sweating, shaking, or chest pain. Anxiety and panic  attacks may start suddenly. They may be strong. They may come at any time of day, even while sleeping. They may come at any time of life. Panic attacks are scary, but they do not harm you physically.  HOME CARE  Avoid any known causes of your anxiety.  Try to relax. Yoga may help. Tell yourself everything will be okay.  Exercise often.  Get expert advice and help (therapy) to stop anxiety or attacks from happening.  Avoid caffeine, alcohol, and drugs.  Only take medicine as told by your doctor. GET HELP RIGHT AWAY IF:  Your attacks seem different than normal attacks.  Your problems are getting worse or concern you. MAKE SURE YOU:  Understand these instructions.  Will watch your condition.  Will get help right away if you are not doing well or get worse. Document Released: 05/27/2010 Document Revised: 08/19/2012 Document Reviewed: 12/06/2012 [facility] Patient Information 2014 [place], [place].

## 2013-05-16 NOTE — ED Notes (Signed)
Pt was upstairs at [facility] office having blood drawn was also having pain in back dr Morton Amy him 2 mg morphine and decadron as well as 500 cc iv fluid. Pt states starting having chest pain after phlebotomy

## 2013-05-16 NOTE — ED Notes (Signed)
Pt was sleeping soundly when I entered the room. Awakened to explain the medication I was about to give. Pt seems calmer . Remains on monitor states pain is better in chest noted 2 episodes of belching

## 2013-05-16 NOTE — ED Provider Notes (Addendum)
CSN: [id number]     Arrival date & time 05/16/13  1119 History   First MD Initiated Contact with Patient 05/16/13 1136     Chief Complaint  Patient presents with  . Chest Pain    HPI  Patient presents after an episode of lightheadedness and near syncope. He has a history of back pain has been present since July of this year. He suffers with pain almost every day. He states he is out of work because of the pain in his back. He started seeing primary care over the last few years. He has been diagnosed with hypertension and hemochromatosis. He gets phlebotomy of 500 mL every 6 months has tolerated this well so far. He had an episode of discomfort a month ago this is his chest. He was admitted at [facility] and ruled out. No invasive testing of his heart. He had a nuclear medicine scan per his report "about one or 2 years ago". He describes this as normal. He was here getting his phlebotomy done today. He states he started thinking about the blood flowing out of his arm. He became anxious. His doctor was described to him about getting an MRI. He states he became anxious about wondering how old be paid for her. He became lightheaded. He became diaphoretic. Postop 250. His blood pressure dropped to 90. He complained of nausea. An IV was established to obtain his blood from. His doctor gave him 2 mg of morphine and Decadron for his back pain prior to this episode as well. He was brought downstairs to [employer] meds at [facility] emergency department for evaluation.  Past Medical History  Diagnosis Date  . Polycythemia vera(238.4)   . Back pain   . Hemochromatosis   . Gout   . Hypertension   . Nephrolithiasis   . Cardiomyopathy    Past Surgical History  Procedure Laterality Date  . Ankle surgery    . Pilonidal cyst excision    . Tonsillectomy     Family History  Problem Relation Age of Onset  . Heart disease      No family history   History  Substance Use Topics  . Smoking status:  Current Every Day Smoker  . Smokeless tobacco: Not on file  . Alcohol Use: Yes     Comment: Occasional    Review of Systems  Constitutional: Positive for diaphoresis. Negative for fever, chills, appetite change and fatigue.  HENT: Negative for mouth sores, sore throat and trouble swallowing.   Eyes: Negative for visual disturbance.  Respiratory: Positive for shortness of breath. Negative for cough, chest tightness and wheezing.   Cardiovascular: Positive for chest pain.  Gastrointestinal: Positive for nausea. Negative for vomiting, abdominal pain, diarrhea and abdominal distention.  Endocrine: Negative for polydipsia, polyphagia and polyuria.  Genitourinary: Negative for dysuria, frequency and hematuria.  Musculoskeletal: Positive for back pain. Negative for gait problem.  Skin: Negative for color change, pallor and rash.  Neurological: Positive for weakness and light-headedness. Negative for dizziness, syncope and headaches.  Hematological: Does not bruise/bleed easily.  Psychiatric/Behavioral: Negative for behavioral problems and confusion.    Allergies  Review of patient's allergies indicates no known allergies.  Home Medications   Current Outpatient Rx  Name  Route  Sig  Dispense  Refill  . amLODipine (NORVASC) 5 MG tablet   Oral   Take 5 mg by mouth at bedtime.          [name] anagrelide (AGRYLIN) 0.5 MG capsule  TAKE 1 CAPSULE BY MOUTH TWICE DAILY   60 capsule   2   . aspirin 81 MG tablet   Oral   Take 81 mg by mouth daily.         [name] COLCRYS 0.6 MG tablet   Oral   Take 0.6 mg by mouth as needed.          [name] lisinopril (PRINIVIL,ZESTRIL) 40 MG tablet   Oral   Take 1 tablet (40 mg total) by mouth daily.   30 tablet   12   . LORazepam (ATIVAN) 1 MG tablet      at bedtime.          . metoprolol succinate (TOPROL-XL) 50 MG 24 hr tablet   Oral   Take 1 tablet (50 mg total) by mouth daily.   90 tablet   3   . ofloxacin (FLOXIN) 0.3 % otic solution       5 drops as needed.         . tamsulosin (FLOMAX) 0.4 MG CAPS   Oral   Take 0.4 mg by mouth daily.         . traMADol (ULTRAM) 50 MG tablet   Oral   Take 50 mg by mouth as needed.          [name] ULORIC 40 MG tablet   Oral   Take 40 mg by mouth daily.           BP 116/62  Pulse 76  Resp 18  SpO2 94% Physical Exam  Constitutional: He is oriented to person, place, and time. He appears well-developed and well-nourished. No distress.  This is an obese white male laying supine not diaphoretic. He is awake alert. He is pleasant.  HENT:  Head: Normocephalic.  Eyes: Conjunctivae are normal. Pupils are equal, round, and reactive to light. No scleral icterus.  Neck: Normal range of motion. Neck supple. No thyromegaly present.  Cardiovascular: Normal rate, regular rhythm, S1 normal, S2 normal and normal heart sounds.  Exam reveals no gallop and no friction rub.   No murmur heard. Pulmonary/Chest: Effort normal and breath sounds normal. No respiratory distress. He has no decreased breath sounds. He has no wheezes. He has no rhonchi. He has no rales.  Abdominal: Soft. Bowel sounds are normal. He exhibits no distension. There is no tenderness. There is no rebound.  Musculoskeletal: Normal range of motion.  Neurological: He is alert and oriented to person, place, and time.  Moves all 4 extremities without difficulty. Awake and alert. Neurologically intact.  Skin: Skin is warm and dry. No rash noted.  Psychiatric: He has a normal mood and affect. His behavior is normal.    ED Course  Procedures (including critical care time) Labs Review Labs Reviewed  COMPREHENSIVE METABOLIC PANEL - Abnormal; Notable for the following:    Glucose, Bld 134 (*)    GFR calc non Af Amer 64 (*)    GFR calc Af Amer 74 (*)    All other components within normal limits  CBC WITH DIFFERENTIAL - Abnormal; Notable for the following:    WBC 11.0 (*)    RBC 5.93 (*)    MCH 24.8 (*)    RDW 17.8 (*)     Platelets 617 (*)    Basophils Relative 2 (*)    Basophils Absolute 0.2 (*)    All other components within normal limits  TROPONIN I  TROPONIN I   Imaging Review No results found.  EKG Interpretation  Date/Time:  Friday May 16 2013 11:25:33 EST Ventricular Rate:  67 PR Interval:  146 QRS Duration: 124 QT Interval:  438 QTC Calculation: 462 R Axis:   -56 Text Interpretation:  Sinus rhythm with marked sinus arrhythmia Left anterior fascicular block Septal infarct , age undetermined No change vs 01-20-2011 Confirmed by Jeneen Rinks  MD, [place] ([id number]) on 05/16/2013 2:42:09 PM            MDM   1. LBP radiating to right leg   2. Vasovagal reaction   3. Chest pain    Patient's EKG shows no change versus comparison. His given IV fluid 1 L. His given Ativan 1 mg previously antiemetic with Zofran. He is resting comfortably and symptom free. I don't think that this is an acute cardiac event. This is anxiety and vasovagal. He is awaiting a second troponin. This is normal and she is appropriate to follow with his primary care physician. He is scheduled for outpatient MRI tomorrow to evaluate his back pain. Hasn't followed up with physician regarding possible cardiology referral with him having more than one episode of chest pain over the last month.  He is feeling well. Sitting upright. He has an MRI scheduled for tomorrow his primary care physician.. Given a prescription for a dose of Ativan to take for claustrophobia beforehand. I think his episodes he clearly was vasovagal with some volume depletion secondary to his phlebotomy.  I have asked to hold his blood pressure medicines on the night before his phlebotomy, and  hydrate himself well. Recheck here if any change or worsening symptoms   Tanna Furry, MD 05/16/13 [place], MD 05/16/13 1525

## 2013-05-16 NOTE — ED Notes (Signed)
MD at bedside. 

## 2013-05-17 ENCOUNTER — Ambulatory Visit (HOSPITAL_BASED_OUTPATIENT_CLINIC_OR_DEPARTMENT_OTHER)
Admit: 2013-05-17 | Discharge: 2013-05-17 | Disposition: A | Discharge status: Home / Self Care | Payer: 59 | Attending: Hematology & Oncology | Admitting: Hematology & Oncology

## 2013-05-17 ENCOUNTER — Other Ambulatory Visit: Payer: Self-pay | Admitting: Hematology & Oncology

## 2013-05-17 DIAGNOSIS — M5126 Other intervertebral disc displacement, lumbar region: Secondary | ICD-10-CM | POA: Insufficient documentation

## 2013-05-17 DIAGNOSIS — R269 Unspecified abnormalities of gait and mobility: Secondary | ICD-10-CM | POA: Insufficient documentation

## 2013-05-17 DIAGNOSIS — M129 Arthropathy, unspecified: Secondary | ICD-10-CM | POA: Insufficient documentation

## 2013-05-17 DIAGNOSIS — M5137 Other intervertebral disc degeneration, lumbosacral region: Secondary | ICD-10-CM | POA: Insufficient documentation

## 2013-05-17 DIAGNOSIS — M545 Low back pain, unspecified: Secondary | ICD-10-CM

## 2013-05-17 DIAGNOSIS — M79609 Pain in unspecified limb: Secondary | ICD-10-CM | POA: Insufficient documentation

## 2013-05-17 DIAGNOSIS — M51379 Other intervertebral disc degeneration, lumbosacral region without mention of lumbar back pain or lower extremity pain: Secondary | ICD-10-CM | POA: Insufficient documentation

## 2013-05-17 DIAGNOSIS — R29898 Other symptoms and signs involving the musculoskeletal system: Secondary | ICD-10-CM | POA: Insufficient documentation

## 2013-05-17 MED ORDER — VENLAFAXINE HCL ER 37.5 MG PO CP24: 37.5000 mg | ORAL_CAPSULE | Freq: Every day | ORAL | Status: DC

## 2013-05-17 MED ORDER — GADOBENATE DIMEGLUMINE 529 MG/ML IV SOLN
20.0000 mL | Freq: Once | INTRAVENOUS | Status: AC | PRN
  Administered 2013-05-17: 20 mL via INTRAVENOUS

## 2013-05-17 NOTE — Progress Notes (Signed)
This office note has been dictated.

## 2013-05-19 DIAGNOSIS — M47817 Spondylosis without myelopathy or radiculopathy, lumbosacral region: Secondary | ICD-10-CM | POA: Insufficient documentation

## 2013-05-19 NOTE — Progress Notes (Signed)
DIAGNOSES: 1. Polycythemia vera-JAK-2 positive. 2. Chronic severe back pain. 3. Panic attacks.  CURRENT THERAPY: 1. Phlebotomy to maintain hematocrit below 45%. 2. Anagrelide 1 mg p.o. daily. 3. Aspirin 81 mg p.o. daily.  INTERIM HISTORY:  William Villanueva comes in for followup.  Unfortunately, things are not going all that well for him.  He apparently hurt his back while driving __________ in June.  He has not been able to work since then.  He has had severe back pain.  He has been seeing a pain specialist.  It sounds like he had a nerve ablation recently, but this has not helped.  He now has radiating pain down his right side.  He has not yet had an MRI.  I will go ahead and __________ with an MRI. I want to make sure that something else is not going on with him.  He is just really hurting.  He could barely walk in the office today.  We got him to the infusion area.  We are going to phlebotomize him.  He was little dehydrated.  We went ahead and gave him IV fluids.  He began to have a panic attack.  He got some IV morphine-2 mg.  We gave him some oral Ativan.  He apparently been having chest pain.  He is having some shortness of breath.  His oxygen saturation was 98%.  He was having some nausea and vomiting.  We also may have to get him down to the emergency room to be evaluated. We tried to get an EKG on him in our office, but we could not.  He is on anagrelide for the polycythemia.  He has thrombocytosis.  He has had no fever.  He has had no rashes.  He has had no change in bowel or bladder habits.  Again, these panic attacks have really been a problem according to his wife.  He just is very, very worried over trying to afford treatments and procedures because he has not been able to work for 6 months.  PHYSICAL EXAMINATION:  On his physical exam, this is an obese white gentleman in moderate distress secondary to back pain.  Vital signs show temperature of 97.4, pulse 74,  respiratory rate 18, blood pressure 128/55, weight is 298 pounds.  Head and neck exam shows a normocephalic, atraumatic skull.  There is no ocular or oral lesion.  There are no palpable cervical or supraclavicular lymph nodes.  Lungs are clear bilaterally.  Cardiac exam regular rate and rhythm with a normal S1, S2. There are no murmurs, rubs, or bruits.  Abdomen is soft.  He is obese. He has good bowel sounds.  There is no fluid wave.  There is no palpable hepatosplenomegaly.  Back exam shows tenderness to palpation over the thoracolumbar spine.  He has paravertebral spasms in the lumbosacral spine.  Extremities shows the weakness in his legs.  He has some decreased range of motion of his hips and knees.  Skin exam no rashes. Neurological exam shows no focal neurological deficits.  LABORATORY STUDIES:  White cell count is 10.1, hemoglobin 15.1, hematocrit 48.5, platelet count 598.  His BUN is 16, creatinine 1.27.  Potassium is 5.3.  Calcium is 9.6.  IMPRESSION:  William Villanueva is a 63 year old gentleman.  He has polycythemia vera.  He is JAK2 positive.  He mostly has been manifested by thrombocytosis.  We have him on anagrelide 1 mg a day.  He has these panic attacks which were a real problem for  him right now. I did go ahead and start Effexor for him.  Hopefully, the MRI might show [place] why __________ so much discomfort in his back.  This really is the source of the problems for him.  He is suffering quite a bit because of the pain.  I do not think he is having any type of cardiac issue, but, we have taken down to the ER, so that they could evaluate him with an EKG and a lab work.  We will see about his MRI.  We will try to get done this weekend.  I want to get him back in another month for followup.  We spent closely an hour with William Villanueva today.  It was very complicated, given his back issues and the panic attacks and the symptoms he was having in the infusion  area.    ______________________________ Volanda Napoleon, M.D. PRE/MEDQ  D:  05/17/2013  T:  05/17/2013  Job:  [id number]

## 2013-05-24 ENCOUNTER — Other Ambulatory Visit (HOSPITAL_BASED_OUTPATIENT_CLINIC_OR_DEPARTMENT_OTHER): Payer: 59

## 2013-05-26 ENCOUNTER — Ambulatory Visit (INDEPENDENT_AMBULATORY_CARE_PROVIDER_SITE_OTHER): Payer: 59 | Admitting: Cardiology

## 2013-05-26 ENCOUNTER — Encounter: Payer: Self-pay | Admitting: Cardiology

## 2013-05-26 VITALS — BP 122/66 | HR 72 | Ht 70.0 in | Wt 297.0 lb

## 2013-05-26 DIAGNOSIS — I428 Other cardiomyopathies: Secondary | ICD-10-CM

## 2013-05-26 DIAGNOSIS — I420 Dilated cardiomyopathy: Secondary | ICD-10-CM

## 2013-05-26 DIAGNOSIS — Z720 Tobacco use: Secondary | ICD-10-CM

## 2013-05-26 DIAGNOSIS — F17200 Nicotine dependence, unspecified, uncomplicated: Secondary | ICD-10-CM

## 2013-05-26 NOTE — Assessment & Plan Note (Signed)
Plan continue beta blocker and ACE inhibitor.

## 2013-05-26 NOTE — Progress Notes (Signed)
HPI: FU cardiomyopathy. Echocardiogram in August of 2013 showed an ejection fraction of 35-40%. There was diffuse hypokinesis. Moderate left ventricular hypertrophy. The left atrium was mild to moderately dilated. Exercise treadmill in August of 2013 was negative. Cardiac catheterization in September of 2013 showed mild nonobstructive coronary disease. There was no hemodynamic evidence of restriction. Pulmonary capillary wedge pressure 10. Cardiac MRI in September of 2013 showed an ejection fraction of 34% with diffuse hypokinesis. There was no hyperenhancement or scar tissue and no evidence of cardiac hemochromatosis. There was moderate left atrial enlargement. TSH in September 2013 normal. Echo repeated in Feb 2014 and showed EF 45. Since I last saw him in March 2014, he has some dyspnea on exertion but no orthopnea, PND, pedal edema, palpitations, syncope or chest pain.   Current Outpatient Prescriptions  Medication Sig Dispense Refill  . amLODipine (NORVASC) 5 MG tablet Take 5 mg by mouth at bedtime.       [name] anagrelide (AGRYLIN) 0.5 MG capsule TAKE 1 CAPSULE BY MOUTH TWICE DAILY  60 capsule  2  . aspirin 81 MG tablet Take 81 mg by mouth daily.      [name] COLCRYS 0.6 MG tablet Take 0.6 mg by mouth as needed.       [name] lisinopril (PRINIVIL,ZESTRIL) 40 MG tablet Take 1 tablet (40 mg total) by mouth daily.  30 tablet  12  . LORazepam (ATIVAN) 1 MG tablet at bedtime.       . metoprolol succinate (TOPROL-XL) 50 MG 24 hr tablet Take 1 tablet (50 mg total) by mouth daily.  90 tablet  3  . ofloxacin (FLOXIN) 0.3 % otic solution 5 drops as needed.      . tamsulosin (FLOMAX) 0.4 MG CAPS Take 0.4 mg by mouth daily.      . traMADol (ULTRAM) 50 MG tablet Take 50 mg by mouth as needed.       [name] ULORIC 40 MG tablet Take 40 mg by mouth daily.        No current facility-administered medications for this visit.     Past Medical History  Diagnosis Date  . Polycythemia vera(238.4)   . Back pain   .  Hemochromatosis   . Gout   . Hypertension   . Nephrolithiasis   . Cardiomyopathy     Past Surgical History  Procedure Laterality Date  . Ankle surgery    . Pilonidal cyst excision    . Tonsillectomy      History   Social History  . Marital Status: Married    Spouse Name: N/A    Number of Children: N/A  . Years of Education: N/A   Occupational History  . Not on file.   Social History Main Topics  . Smoking status: Former Research scientist (life sciences)  . Smokeless tobacco: Not on file     Comment: quit november 2014  . Alcohol Use: Yes     Comment: Occasional  . Drug Use: No  . Sexual Activity: Not on file   Other Topics Concern  . Not on file   Social History Narrative  . No narrative on file    ROS: pain but no fevers or chills, productive cough, hemoptysis, dysphasia, odynophagia, melena, hematochezia, dysuria, hematuria, rash, seizure activity, orthopnea, PND, pedal edema, claudication. Remaining systems are negative.  Physical Exam: Well-developed morbidly obese in no acute distress.  Skin is warm and dry.  HEENT is normal.  Neck is supple.  Chest is clear to auscultation with  normal expansion.  Cardiovascular exam is regular rate and rhythm.  Abdominal exam nontender or distended. No masses palpated. Extremities show no edema. neuro grossly intact

## 2013-05-26 NOTE — Patient Instructions (Signed)
Your physician wants you to follow-up in: ONE YEAR WITH DR CRENSHAW You will receive a reminder letter in the mail two months in advance. If you don't receive a letter, please call our office to schedule the follow-up appointment.  

## 2013-05-26 NOTE — Assessment & Plan Note (Signed)
Patient has discontinued. 

## 2013-06-04 ENCOUNTER — Encounter: Payer: Self-pay | Admitting: *Deleted

## 2013-06-05 ENCOUNTER — Encounter: Payer: Self-pay | Admitting: Hematology & Oncology

## 2013-06-05 ENCOUNTER — Ambulatory Visit (HOSPITAL_BASED_OUTPATIENT_CLINIC_OR_DEPARTMENT_OTHER): Payer: 59

## 2013-06-05 ENCOUNTER — Ambulatory Visit (HOSPITAL_BASED_OUTPATIENT_CLINIC_OR_DEPARTMENT_OTHER): Payer: 59 | Admitting: Hematology & Oncology

## 2013-06-05 ENCOUNTER — Other Ambulatory Visit (HOSPITAL_BASED_OUTPATIENT_CLINIC_OR_DEPARTMENT_OTHER): Payer: 59 | Admitting: Lab

## 2013-06-05 VITALS — BP 122/50 | HR 66 | Temp 98.2°F | Resp 18 | Ht 70.0 in | Wt 296.0 lb

## 2013-06-05 DIAGNOSIS — M79604 Pain in right leg: Secondary | ICD-10-CM

## 2013-06-05 DIAGNOSIS — M545 Low back pain, unspecified: Secondary | ICD-10-CM

## 2013-06-05 DIAGNOSIS — F410 Panic disorder [episodic paroxysmal anxiety] without agoraphobia: Secondary | ICD-10-CM

## 2013-06-05 DIAGNOSIS — IMO0002 Reserved for concepts with insufficient information to code with codable children: Secondary | ICD-10-CM

## 2013-06-05 DIAGNOSIS — D45 Polycythemia vera: Secondary | ICD-10-CM

## 2013-06-05 LAB — CBC WITH DIFFERENTIAL (CANCER CENTER ONLY)
BASO#: 0.2 10*3/uL (ref 0.0–0.2)
BASO%: 1.4 % (ref 0.0–2.0)
EOS%: 3.2 % (ref 0.0–7.0)
Eosinophils Absolute: 0.3 10*3/uL (ref 0.0–0.5)
HCT: 47.1 % (ref 38.7–49.9)
HGB: 14.3 g/dL (ref 13.0–17.1)
LYMPH#: 2.1 10*3/uL (ref 0.9–3.3)
LYMPH%: 19.8 % (ref 14.0–48.0)
MCH: 24.7 pg — ABNORMAL LOW (ref 28.0–33.4)
MCHC: 30.4 g/dL — ABNORMAL LOW (ref 32.0–35.9)
MCV: 81 fL — ABNORMAL LOW (ref 82–98)
MONO#: 0.9 10*3/uL (ref 0.1–0.9)
MONO%: 9 % (ref 0.0–13.0)
NEUT#: 6.9 10*3/uL — ABNORMAL HIGH (ref 1.5–6.5)
NEUT%: 66.6 % (ref 40.0–80.0)
Platelets: 619 10*3/uL — ABNORMAL HIGH (ref 145–400)
RBC: 5.79 10*6/uL — ABNORMAL HIGH (ref 4.20–5.70)
RDW: 18.4 % — ABNORMAL HIGH (ref 11.1–15.7)
WBC: 10.4 10*3/uL — ABNORMAL HIGH (ref 4.0–10.0)

## 2013-06-05 LAB — IRON AND TIBC CHCC
%SAT: 9 % — ABNORMAL LOW (ref 20–55)
Iron: 39 ug/dL — ABNORMAL LOW (ref 42–163)
TIBC: 436 ug/dL — ABNORMAL HIGH (ref 202–409)
UIBC: 397 ug/dL — ABNORMAL HIGH (ref 117–376)

## 2013-06-05 LAB — CMP (CANCER CENTER ONLY)
ALT(SGPT): 34 U/L (ref 10–47)
AST: 28 U/L (ref 11–38)
Albumin: 3.5 g/dL (ref 3.3–5.5)
Alkaline Phosphatase: 65 U/L (ref 26–84)
BUN, Bld: 22 mg/dL (ref 7–22)
CO2: 31 mEq/L (ref 18–33)
Calcium: 8.9 mg/dL (ref 8.0–10.3)
Chloride: 102 mEq/L (ref 98–108)
Creat: 1.4 mg/dl — ABNORMAL HIGH (ref 0.6–1.2)
Glucose, Bld: 157 mg/dL — ABNORMAL HIGH (ref 73–118)
Potassium: 4.4 mEq/L (ref 3.3–4.7)
Sodium: 143 mEq/L (ref 128–145)
Total Bilirubin: 0.7 mg/dl (ref 0.20–1.60)
Total Protein: 7 g/dL (ref 6.4–8.1)

## 2013-06-05 LAB — LACTATE DEHYDROGENASE: LDH: 205 U/L (ref 94–250)

## 2013-06-05 LAB — FERRITIN CHCC: Ferritin: 16 ng/ml — ABNORMAL LOW (ref 22–316)

## 2013-06-05 MED ORDER — LORAZEPAM 1 MG PO TABS
1.0000 mg | ORAL_TABLET | Freq: Once | ORAL | Status: AC
  Administered 2013-06-05: 1 mg via ORAL

## 2013-06-05 MED ORDER — LORAZEPAM 1 MG PO TABS
ORAL_TABLET | ORAL | Status: AC
  Filled 2013-06-05: qty 1

## 2013-06-05 MED ORDER — MORPHINE SULFATE 4 MG/ML IJ SOLN
2.0000 mg | Freq: Once | INTRAMUSCULAR | Status: AC
  Administered 2013-05-16: 2 mg via INTRAVENOUS

## 2013-06-05 MED ORDER — SODIUM CHLORIDE 0.9 % IV SOLN
INTRAVENOUS | Status: DC
  Administered 2013-05-16: 10:00:00 via INTRAVENOUS

## 2013-06-05 MED ORDER — SODIUM CHLORIDE 0.9 % IV SOLN: INTRAVENOUS | Status: AC

## 2013-06-05 MED ORDER — SODIUM CHLORIDE 0.9 % IV SOLN
Freq: Once | INTRAVENOUS | Status: AC
  Administered 2013-06-05: 10:00:00 via INTRAVENOUS

## 2013-06-05 MED ORDER — DEXAMETHASONE SODIUM PHOSPHATE 20 MG/5ML IJ SOLN
20.0000 mg | Freq: Once | INTRAMUSCULAR | Status: AC
  Administered 2013-05-16: 20 mg via INTRAVENOUS

## 2013-06-05 NOTE — Patient Instructions (Signed)

## 2013-06-05 NOTE — Progress Notes (Signed)
William Villanueva presents today for phlebotomy per MD orders. Phlebotomy procedure started at 0900 and ended at 1000. 350 mls removed, pt became diaphoretic and vomiting approximately 500 mls vomitus.  IV removed d/t clotted needle, IV restarted for NS IVFs. Also given Ativan for anxiety.   10:24 AM Pt sleeping. 11:29 AM No complaints, ambulates with cane. Discharged with wife.

## 2013-06-05 NOTE — Addendum Note (Signed)
Addended by: Jimmy Footman on: 06/05/2013 02:17 PM   Modules accepted: Orders

## 2013-06-05 NOTE — Progress Notes (Signed)
This office note has been dictated.

## 2013-06-06 ENCOUNTER — Other Ambulatory Visit: Payer: Self-pay | Admitting: *Deleted

## 2013-06-06 ENCOUNTER — Other Ambulatory Visit: Payer: 59 | Admitting: Lab

## 2013-06-06 ENCOUNTER — Ambulatory Visit: Payer: 59 | Admitting: Hematology & Oncology

## 2013-06-06 NOTE — Progress Notes (Signed)
DIAGNOSES: 1. Polycythemia vera-JAK2 positive. 2. 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  CURRENT THERAPY: 1. Phlebotomy to maintain hematocrit below 45%. 2. Anagrelide 1 mg p.o. daily. 3. Aspirin 81 mg p.o. daily.  INTERIM HISTORY:  [name] comes in for followup.  He is still having lot of pain.  He still has the right radicular leg pain.  We did do his MRI.  Not surprising, this showed foraminal stenosis at L4- L5 with compression of the right L4 nerve root.  He finally was seen by doctor with Workmen's Comp.  They agreed that he needed surgery.  I am just sad that he has taken 8 months or so for him to understand this.  [name] is on new pain medicine.  This is helping a little bit.  He said he felt a "pop" in his back last time.  He has some pain down the left leg.  His MRI, did show that there was some mild-to-moderate stenosis at L5-S1.  When he was here last, he was having a lot of problems.  I do not know if he had a panic attack.  We had to get him down to the emergency room because of chest pain issues.  He is feeling better.  He is not as anxious or nervous.  PHYSICAL EXAMINATION:  General:  On his physical exam, this is an obese white gentleman in no obvious distress.  Vital signs:  Temperature of 98.2, pulse 66, respiratory rate 18, blood pressure 122/50, weight is 296 pounds.  Head and Neck:  Normocephalic, atraumatic skull.  There are no ocular or oral lesions.  There is no palpable cervical or supraclavicular lymph nodes.  Lungs:  Clear bilaterally.  Cardiac: Regular rate and rhythm with a normal S1, S2.  There are no murmurs, rubs, or bruits.  Abdomen is obese, but soft.  He has good bowel sounds. There is no fluid wave.  There is no palpable hepatosplenomegaly. Extremities:  No clubbing, cyanosis, or edema.  He has good range of motion of his joints.  He does have a positive straight leg  raise with his right leg.  There is no obvious weakness in his legs.  Skin:  No rashes, ecchymosis, or petechia.  Neurological:  No focal neurological deficits.  LABORATORY STUDIES:  White cell count is 10.4, hemoglobin 14.3, hematocrit 47.1, platelet count 619.  MCV is 81.  BUN 22, creatinine 1.4.  IMPRESSION:  [name] is a 63 year old gentleman.  He has polycythemia.  We will go ahead and phlebotomize him today.  Again, his back is the biggest issue right now.  He will need surgery. I am truly confident about this.  The MRI, in my mind, is conclusive about the right L4 nerve root compression by stenosis.  Hopefully, he will be able to get to an orthopedic or neurosurgeon. Hopefully, this will be done soon, so he can have surgery, so he will not suffer.  I am just glad that he is not as stressed out.  Hopefully, things will continue to improve for him.  I will plan to see him back in about 2 months now.    ______________________________ Volanda Napoleon, M.D. PRE/MEDQ  D:  06/05/2013  T:  06/06/2013  Job:  [id number]

## 2013-07-11 ENCOUNTER — Telehealth: Payer: Self-pay | Admitting: Hematology & Oncology

## 2013-07-11 NOTE — Telephone Encounter (Signed)
Faxed Medical Records via fax today  to:  [facility]  Attn: Waldon Reining, [place] Ph: [phone] Fx: [phone]   Medical  Records requested from 2013 to present   [place] SCANNED

## 2013-08-06 ENCOUNTER — Ambulatory Visit (HOSPITAL_BASED_OUTPATIENT_CLINIC_OR_DEPARTMENT_OTHER): Payer: 59 | Admitting: Hematology & Oncology

## 2013-08-06 ENCOUNTER — Other Ambulatory Visit (HOSPITAL_BASED_OUTPATIENT_CLINIC_OR_DEPARTMENT_OTHER): Payer: 59 | Admitting: Lab

## 2013-08-06 VITALS — BP 125/54 | HR 79 | Temp 97.4°F | Resp 20 | Wt 301.0 lb

## 2013-08-06 DIAGNOSIS — D45 Polycythemia vera: Secondary | ICD-10-CM

## 2013-08-06 DIAGNOSIS — M48061 Spinal stenosis, lumbar region without neurogenic claudication: Secondary | ICD-10-CM | POA: Insufficient documentation

## 2013-08-06 LAB — CMP (CANCER CENTER ONLY)
ALT(SGPT): 43 U/L (ref 10–47)
AST: 38 U/L (ref 11–38)
Albumin: 3.4 g/dL (ref 3.3–5.5)
Alkaline Phosphatase: 67 U/L (ref 26–84)
BUN, Bld: 17 mg/dL (ref 7–22)
CO2: 25 mEq/L (ref 18–33)
Calcium: 8.7 mg/dL (ref 8.0–10.3)
Chloride: 104 mEq/L (ref 98–108)
Creat: 1.1 mg/dl (ref 0.6–1.2)
Glucose, Bld: 231 mg/dL — ABNORMAL HIGH (ref 73–118)
Potassium: 3.9 mEq/L (ref 3.3–4.7)
Sodium: 137 mEq/L (ref 128–145)
Total Bilirubin: 0.7 mg/dl (ref 0.20–1.60)
Total Protein: 7 g/dL (ref 6.4–8.1)

## 2013-08-06 LAB — CBC WITH DIFFERENTIAL (CANCER CENTER ONLY)
BASO#: 0.1 10*3/uL (ref 0.0–0.2)
BASO%: 1.3 % (ref 0.0–2.0)
EOS%: 2.6 % (ref 0.0–7.0)
Eosinophils Absolute: 0.3 10*3/uL (ref 0.0–0.5)
HCT: 46.8 % (ref 38.7–49.9)
HGB: 14.8 g/dL (ref 13.0–17.1)
LYMPH#: 1.9 10*3/uL (ref 0.9–3.3)
LYMPH%: 19.2 % (ref 14.0–48.0)
MCH: 25 pg — ABNORMAL LOW (ref 28.0–33.4)
MCHC: 31.6 g/dL — ABNORMAL LOW (ref 32.0–35.9)
MCV: 79 fL — ABNORMAL LOW (ref 82–98)
MONO#: 0.7 10*3/uL (ref 0.1–0.9)
MONO%: 6.9 % (ref 0.0–13.0)
NEUT#: 6.8 10*3/uL — ABNORMAL HIGH (ref 1.5–6.5)
NEUT%: 70 % (ref 40.0–80.0)
Platelets: 613 10*3/uL — ABNORMAL HIGH (ref 145–400)
RBC: 5.93 10*6/uL — ABNORMAL HIGH (ref 4.20–5.70)
RDW: 18.5 % — ABNORMAL HIGH (ref 11.1–15.7)
WBC: 9.8 10*3/uL (ref 4.0–10.0)

## 2013-08-06 LAB — LACTATE DEHYDROGENASE: LDH: 208 U/L (ref 94–250)

## 2013-08-07 ENCOUNTER — Telehealth: Payer: Self-pay | Admitting: Hematology & Oncology

## 2013-08-07 ENCOUNTER — Ambulatory Visit (HOSPITAL_BASED_OUTPATIENT_CLINIC_OR_DEPARTMENT_OTHER): Payer: 59

## 2013-08-07 DIAGNOSIS — D45 Polycythemia vera: Secondary | ICD-10-CM

## 2013-08-07 NOTE — Progress Notes (Signed)
William Villanueva presents today for phlebotomy per MD orders. Phlebotomy procedure started at 084 and ended at 0900. 500 grams removed. Patient observed for 30 minutes after procedure without any incident. Patient tolerated procedure well. IV needle removed intact.

## 2013-08-07 NOTE — Progress Notes (Signed)
Hematology and Oncology Follow Up Visit  William Villanueva [id number] [birth year] 63 y.o. 08/07/2013   Principle Diagnosis:   Polycythemia vera-JAK2 positive  Severe back pain secondary to spinal stenosis with right leg radicular pain  Current Therapy:    Phlebotomy to maintain hematocrit Melinda Crutch  Anagrelide 1 mg by mouth daily  Aspirin 81 mg by mouth daily     Interim History:  [name] is in for followup. Shockingly enough, he still has not had surgery. He is still suffering. The workers compensation is really causing problems for him. He is seen 3 doctors already. He is in the appointment with a doctor today. He was a Chief of Staff.  He clearly has a spinal defect that we'll not be corrected unless is done surgically. Again, workers compensation is "dragging thire feet" so that they don't have to pay for this.  He can barely get around. He cannot sit. He can stand for only a few minutes.  He's had no headache. He's had no nausea vomiting.  Is gaining weight because he cannot exercise because of his back.  He's had no bleeding. He's had no change in bowel or bladder habits.  Medications: Current outpatient prescriptions:amLODipine (NORVASC) 5 MG tablet, Take 5 mg by mouth at bedtime. , Disp: , Rfl: ;  anagrelide (AGRYLIN) 0.5 MG capsule, TAKE 1 CAPSULE BY MOUTH TWICE DAILY, Disp: 60 capsule, Rfl: 2;  aspirin 81 MG tablet, Take 81 mg by mouth daily., Disp: , Rfl: ;  COLCRYS 0.6 MG tablet, Take 0.6 mg by mouth as needed. , Disp: , Rfl:  metoprolol succinate (TOPROL-XL) 50 MG 24 hr tablet, Take 1 tablet (50 mg total) by mouth daily., Disp: 90 tablet, Rfl: 3;  tamsulosin (FLOMAX) 0.4 MG CAPS, Take 0.4 mg by mouth as needed. , Disp: , Rfl: ;  ULORIC 40 MG tablet, Take 40 mg by mouth daily. , Disp: , Rfl: ;  zolpidem (AMBIEN) 10 MG tablet, , Disp: , Rfl: ;  lisinopril (PRINIVIL,ZESTRIL) 40 MG tablet, Take 1 tablet (40 mg total) by mouth daily., Disp: 30 tablet, Rfl:  12 LORazepam (ATIVAN) 1 MG tablet, at bedtime. , Disp: , Rfl: ;  ofloxacin (FLOXIN) 0.3 % otic solution, 5 drops as needed., Disp: , Rfl: ;  venlafaxine XR (EFFEXOR-XR) 37.5 MG 24 hr capsule, , Disp: , Rfl:  No current facility-administered medications for this visit. Facility-Administered Medications Ordered in Other Visits: 0.9 %  sodium chloride infusion, , Intravenous, Continuous, Volanda Napoleon, MD  Allergies: No Known Allergies  Past Medical History, Surgical history, Social history, and Family History were reviewed and updated.  Review of Systems: As above  Physical Exam:  weight is 301 lb (136.533 kg). His oral temperature is 97.4 F (36.3 C). His blood pressure is 125/54 and his pulse is 79. His respiration is 20.   Obese white gentleman. He is in moderate distress due to pain. Lungs are clear. Cardiac exam regular in rhythm and head and neck exam shows target however that are infused. Has no oral lesions. Abdomen is obese but soft. There is no palpable liver or spleen. Extremities shows no clubbing cyanosis or edema. Skin exam no rashes. Strength in his lower extremities is okay. He my have some slight decrease in the right leg. Neurological exam does show a slight right leg strength decreased.  Lab Results  Component Value Date   WBC 9.8 08/06/2013   HGB 14.8 08/06/2013   HCT 46.8 08/06/2013   MCV 79* 08/06/2013  PLT 613* 08/06/2013     Chemistry      Component Value Date/Time   NA 137 08/06/2013 0957   NA 139 05/16/2013 1140   K 3.9 08/06/2013 0957   K 4.1 05/16/2013 1140   CL 104 08/06/2013 0957   CL 103 05/16/2013 1140   CO2 25 08/06/2013 0957   CO2 22 05/16/2013 1140   BUN 17 08/06/2013 0957   BUN 17 05/16/2013 1140   CREATININE 1.1 08/06/2013 0957   CREATININE 1.20 05/16/2013 1140      Component Value Date/Time   CALCIUM 8.7 08/06/2013 0957   CALCIUM 8.9 05/16/2013 1140   ALKPHOS 67 08/06/2013 0957   ALKPHOS 76 05/16/2013 1140   AST 38 08/06/2013 0957   AST 24 05/16/2013 1140   ALT 43 08/06/2013  0957   ALT 29 05/16/2013 1140   BILITOT 0.70 08/06/2013 0957   BILITOT 0.4 05/16/2013 1140         Impression and Plan: William Villanueva is 63 year old gentleman with polycythemia vera. He is JAK2 positive.  His problem is the back right now. I think that his platelet count is somewhat increased because of his pain in his back issues.  If he needs surgery, I do not have any problems with him having surgery. I don't see him at any risk that is higher than normal for clots or bleeding.  We will phlebotomize him today.  ... Blood smear. I do not see anything that looked suspicious for a kind of progression.  I does feel bad that he has suffered for so long. Again, workers compensation has really been a problem which is no surprise.  I will plan to get him back to see [place] in another 4-6 weeks.  Hopefully the new doctor that he sees will be able to do surgery.   Volanda Napoleon, MD 4/2/20159:28 AM

## 2013-08-07 NOTE — Patient Instructions (Signed)

## 2013-08-07 NOTE — Telephone Encounter (Signed)
Mailed 5-13 schedule

## 2013-08-15 DIAGNOSIS — M6250 Muscle wasting and atrophy, not elsewhere classified, unspecified site: Secondary | ICD-10-CM | POA: Insufficient documentation

## 2013-08-20 ENCOUNTER — Other Ambulatory Visit: Payer: Self-pay | Admitting: Hematology & Oncology

## 2013-08-20 ENCOUNTER — Other Ambulatory Visit: Payer: Self-pay | Admitting: Neurosurgery

## 2013-08-20 DIAGNOSIS — D45 Polycythemia vera: Secondary | ICD-10-CM

## 2013-08-20 DIAGNOSIS — M549 Dorsalgia, unspecified: Secondary | ICD-10-CM

## 2013-08-27 ENCOUNTER — Ambulatory Visit
Admission: RE | Admit: 2013-08-27 | Discharge: 2013-08-27 | Disposition: A | Discharge status: Home / Self Care | Payer: Worker's Compensation | Source: Ambulatory Visit | Attending: Neurosurgery | Admitting: Neurosurgery

## 2013-08-27 VITALS — BP 84/53 | HR 78

## 2013-08-27 DIAGNOSIS — M549 Dorsalgia, unspecified: Secondary | ICD-10-CM

## 2013-08-27 MED ORDER — DIAZEPAM 5 MG PO TABS
10.0000 mg | ORAL_TABLET | Freq: Once | ORAL | Status: AC
  Administered 2013-08-27: 5 mg via ORAL

## 2013-08-27 MED ORDER — IOHEXOL 180 MG/ML  SOLN
18.0000 mL | Freq: Once | INTRAMUSCULAR | Status: AC | PRN
  Administered 2013-08-27: 18 mL via INTRATHECAL

## 2013-08-27 NOTE — Discharge Instructions (Signed)

## 2013-09-17 ENCOUNTER — Ambulatory Visit (HOSPITAL_BASED_OUTPATIENT_CLINIC_OR_DEPARTMENT_OTHER): Payer: 59

## 2013-09-17 ENCOUNTER — Other Ambulatory Visit (HOSPITAL_BASED_OUTPATIENT_CLINIC_OR_DEPARTMENT_OTHER): Payer: 59 | Admitting: Lab

## 2013-09-17 ENCOUNTER — Ambulatory Visit (HOSPITAL_BASED_OUTPATIENT_CLINIC_OR_DEPARTMENT_OTHER): Payer: 59 | Admitting: Hematology & Oncology

## 2013-09-17 VITALS — BP 127/63 | HR 77 | Temp 98.0°F | Resp 18

## 2013-09-17 DIAGNOSIS — D45 Polycythemia vera: Secondary | ICD-10-CM

## 2013-09-17 LAB — CBC WITH DIFFERENTIAL (CANCER CENTER ONLY)
BASO#: 0.2 10*3/uL (ref 0.0–0.2)
BASO%: 1.5 % (ref 0.0–2.0)
EOS%: 2.9 % (ref 0.0–7.0)
Eosinophils Absolute: 0.3 10*3/uL (ref 0.0–0.5)
HCT: 48 % (ref 38.7–49.9)
HGB: 15.4 g/dL (ref 13.0–17.1)
LYMPH#: 1.8 10*3/uL (ref 0.9–3.3)
LYMPH%: 17 % (ref 14.0–48.0)
MCH: 25 pg — ABNORMAL LOW (ref 28.0–33.4)
MCHC: 32.1 g/dL (ref 32.0–35.9)
MCV: 78 fL — ABNORMAL LOW (ref 82–98)
MONO#: 0.9 10*3/uL (ref 0.1–0.9)
MONO%: 8.2 % (ref 0.0–13.0)
NEUT#: 7.6 10*3/uL — ABNORMAL HIGH (ref 1.5–6.5)
NEUT%: 70.4 % (ref 40.0–80.0)
Platelets: 682 10*3/uL — ABNORMAL HIGH (ref 145–400)
RBC: 6.15 10*6/uL — ABNORMAL HIGH (ref 4.20–5.70)
RDW: 18.4 % — ABNORMAL HIGH (ref 11.1–15.7)
WBC: 10.8 10*3/uL — ABNORMAL HIGH (ref 4.0–10.0)

## 2013-09-17 LAB — CHCC SATELLITE - SMEAR

## 2013-09-17 MED ORDER — ANAGRELIDE HCL 0.5 MG PO CAPS: 0.5000 mg | ORAL_CAPSULE | Freq: Every day | ORAL | Status: DC

## 2013-09-17 NOTE — Progress Notes (Signed)
William Villanueva presents today for phlebotomy per MD orders. Phlebotomy procedure started at 1340 and ended at 1350. Approximately 500 mls removed. Patient observed for 30 minutes after procedure without any incident. Patient tolerated procedure well. IV needle removed intact.

## 2013-09-17 NOTE — Progress Notes (Signed)
William Villanueva presents today for phlebotomy per MD orders. Phlebotomy procedure started at 1340 and ended at 1350. 500 grams removed. Patient observed for 30 minutes after procedure without any incident. Patient tolerated procedure well. IV needle removed intact.

## 2013-09-17 NOTE — Patient Instructions (Signed)
Therapeutic Phlebotomy Therapeutic phlebotomy is the controlled removal of blood from your body for the purpose of treating a medical condition. It is similar to donating blood. Usually, about a pint (470 mL) of blood is removed. The average adult has 9 to 12 pints (4.3 to 5.7 L) of blood. Therapeutic phlebotomy may be used to treat the following medical conditions:  Hemochromatosis. This is a condition in which there is too much iron in the blood.  Polycythemia vera. This is a condition in which there are too many red cells in the blood.  Porphyria cutanea tarda. This is a disease usually passed from one generation to the next (inherited). It is a condition in which an important part of hemoglobin is not made properly. This results in the build up of abnormal amounts of porphyrins in the body.  Sickle cell disease. This is an inherited disease. It is a condition in which the red blood cells form an abnormal crescent shape rather than a round shape. LET YOUR CAREGIVER KNOW ABOUT:  Allergies.  Medicines taken including herbs, eyedrops, over-the-counter medicines, and creams.  Use of steroids (by mouth or creams).  Previous problems with anesthetics or numbing medicine.  History of blood clots.  History of bleeding or blood problems.  Previous surgery.  Possibility of pregnancy, if this applies. RISKS AND COMPLICATIONS This is a simple and safe procedure. Problems are unlikely. However, problems can occur and may include:  Nausea or lightheadedness.  Low blood pressure.  Soreness, bleeding, swelling, or bruising at the needle insertion site.  Infection. BEFORE THE PROCEDURE  This is a procedure that can be done as an outpatient. Confirm the time that you need to arrive for your procedure. Confirm whether there is a need to fast or withhold any medications. It is helpful to wear clothing with sleeves that can be raised above the elbow. A blood sample may be done to determine the  amount of red blood cells or iron in your blood. Plan ahead of time to have someone drive you home after the procedure. PROCEDURE The entire procedure from preparation through recovery takes about 1 hour. The actual collection takes about 10 to 15 minutes.  A needle will be inserted into your vein.  Tubing and a collection bag will be attached to that needle.  Blood will flow through the needle and tubing into the collection bag.  You may be asked to open and close your hand slowly and continuously during the entire collection.  Once the specified amount of blood has been removed from your body, the collection bag and tubing will be clamped.  The needle will be removed.  Pressure will be held on the site of the needle insertion to stop the bleeding. Then a bandage will be placed over the needle insertion site. AFTER THE PROCEDURE  Your recovery will be assessed and monitored. If there are no problems, as an outpatient, you should be able to go home shortly after the procedure.  Document Released: 09/26/2010 Document Revised: 07/17/2011 Document Reviewed: 09/26/2010 [employer] Patient Information 2014 [employer].  

## 2013-09-17 NOTE — Progress Notes (Signed)
Hematology and Oncology Follow Up Visit  William Villanueva [id number] Nov 11, 1950 63 y.o. 09/17/2013   Principle Diagnosis:   Polycythemia vera-JAK2 positive  Severe back pain secondary to spinal stenosis with right leg radicular pain  Current Therapy:    Phlebotomy to maintain hematocrit below 45%  Anagrelide 1 mg by mouth daily  Aspirin 81 mg by mouth daily     Interim History:  Mr.  William Villanueva is back for followup. He still has yet to have surgery for his back. He had a myelogram. This not surprisingly showed a right L4 nerve root compression. Of course, workers compensation is now taking her time in trying to improve surgery for him. We knew he had this problem over 2 months ago with an MRI. Is sad and pailful that workers compensation is doing this and only tried the benefits and cells.  He is totally stressed out. He is gaining weight. He can't exercise. He's had some leg swelling. He's had no cough. He's had no rashes. He's had no change in bowel or bladder habits.  Medications: Current outpatient prescriptions:amLODipine (NORVASC) 5 MG tablet, Take 5 mg by mouth at bedtime. , Disp: , Rfl: ;  anagrelide (AGRYLIN) 0.5 MG capsule, TAKE 1 CAPSULE BY MOUTH TWICE DAILY, Disp: 60 capsule, Rfl: 2;  aspirin 81 MG tablet, Take 81 mg by mouth daily., Disp: , Rfl: ;  COLCRYS 0.6 MG tablet, Take 0.6 mg by mouth as needed. , Disp: , Rfl:  lisinopril (PRINIVIL,ZESTRIL) 40 MG tablet, Take 1 tablet (40 mg total) by mouth daily., Disp: 30 tablet, Rfl: 12;  metoprolol succinate (TOPROL-XL) 50 MG 24 hr tablet, Take 1 tablet (50 mg total) by mouth daily., Disp: 90 tablet, Rfl: 3;  ofloxacin (FLOXIN) 0.3 % otic solution, 5 drops as needed., Disp: , Rfl: ;  tamsulosin (FLOMAX) 0.4 MG CAPS, Take 0.4 mg by mouth as needed. , Disp: , Rfl:  ULORIC 40 MG tablet, Take 40 mg by mouth daily. , Disp: , Rfl: ;  venlafaxine XR (EFFEXOR-XR) 37.5 MG 24 hr capsule, , Disp: , Rfl: ;  zolpidem (AMBIEN) 10 MG tablet, , Disp: ,  Rfl: ;  LORazepam (ATIVAN) 1 MG tablet, at bedtime. , Disp: , Rfl:  No current facility-administered medications for this visit. Facility-Administered Medications Ordered in Other Visits: 0.9 %  sodium chloride infusion, , Intravenous, Continuous, Volanda Napoleon, MD  Allergies: No Known Allergies  Past Medical History, Surgical history, Social history, and Family History were reviewed and updated.  Review of Systems: As above  Physical Exam:  oral temperature is 98 F (36.7 C). His blood pressure is 127/63 and his pulse is 77. His respiration is 18 and oxygen saturation is 97%.   Obese white cell and. Lungs are clear. Cardiac exam regular in rhythm. Head exam shows no ocular or oral lesions. There is no scleral icterus. Abdomen is obese. Abdomen is soft. Has no palpable liver or spleen tip. Back exam no tenderness over the spine ribs or hips. Extremities shows some trace edema in his legs. Skin exam no rashes. Neurological exam is nonfocal.  Lab Results  Component Value Date   WBC 10.8* 09/17/2013   HGB 15.4 09/17/2013   HCT 48.0 09/17/2013   MCV 78* 09/17/2013   PLT 682* 09/17/2013     Chemistry      Component Value Date/Time   NA 137 08/06/2013 0957   NA 139 05/16/2013 1140   K 3.9 08/06/2013 0957   K 4.1 05/16/2013 1140  CL 104 08/06/2013 0957   CL 103 05/16/2013 1140   CO2 25 08/06/2013 0957   CO2 22 05/16/2013 1140   BUN 17 08/06/2013 0957   BUN 17 05/16/2013 1140   CREATININE 1.1 08/06/2013 0957   CREATININE 1.20 05/16/2013 1140      Component Value Date/Time   CALCIUM 8.7 08/06/2013 0957   CALCIUM 8.9 05/16/2013 1140   ALKPHOS 67 08/06/2013 0957   ALKPHOS 76 05/16/2013 1140   AST 38 08/06/2013 0957   AST 24 05/16/2013 1140   ALT 43 08/06/2013 0957   ALT 29 05/16/2013 1140   BILITOT 0.70 08/06/2013 0957   BILITOT 0.4 05/16/2013 1140         Impression and Plan: William Villanueva is 63 year old coming. He is a polycythemia vera. We are phlebotomizing him. We will have to phlebotomizing today.  He  continues on the aspirin and the anagrelide. His blood count is [place] elevated. I think this probably because of the stress and chronic inflammation from his nerve root compression.  Again, he desperately needs to have the surgery. I the once he has his surgery, his blood counts will improve.  I'll plan to get him back in another 4-6 weeks.  Identifying we'll continue to pray hard for him and also peripheral does who are trying to do the wrong thing and deny him surgery that will help.   Volanda Napoleon, MD 5/13/20151:45 PM

## 2013-10-29 ENCOUNTER — Ambulatory Visit: Payer: Worker's Compensation

## 2013-10-29 ENCOUNTER — Other Ambulatory Visit (HOSPITAL_BASED_OUTPATIENT_CLINIC_OR_DEPARTMENT_OTHER): Payer: 59 | Admitting: Lab

## 2013-10-29 ENCOUNTER — Ambulatory Visit (HOSPITAL_BASED_OUTPATIENT_CLINIC_OR_DEPARTMENT_OTHER): Payer: 59 | Admitting: Hematology & Oncology

## 2013-10-29 VITALS — BP 115/76 | HR 136 | Temp 97.8°F | Resp 20 | Ht 70.5 in | Wt 289.0 lb

## 2013-10-29 DIAGNOSIS — D45 Polycythemia vera: Secondary | ICD-10-CM

## 2013-10-29 LAB — CBC WITH DIFFERENTIAL (CANCER CENTER ONLY)
BASO#: 0.3 10*3/uL — ABNORMAL HIGH (ref 0.0–0.2)
BASO%: 2 % (ref 0.0–2.0)
EOS%: 2.3 % (ref 0.0–7.0)
Eosinophils Absolute: 0.3 10*3/uL (ref 0.0–0.5)
HCT: 45.5 % (ref 38.7–49.9)
HGB: 14.8 g/dL (ref 13.0–17.1)
LYMPH#: 2.4 10*3/uL (ref 0.9–3.3)
LYMPH%: 18.8 % (ref 14.0–48.0)
MCH: 24.8 pg — ABNORMAL LOW (ref 28.0–33.4)
MCHC: 32.5 g/dL (ref 32.0–35.9)
MCV: 76 fL — ABNORMAL LOW (ref 82–98)
MONO#: 1.3 10*3/uL — ABNORMAL HIGH (ref 0.1–0.9)
MONO%: 10.1 % (ref 0.0–13.0)
NEUT#: 8.6 10*3/uL — ABNORMAL HIGH (ref 1.5–6.5)
NEUT%: 66.8 % (ref 40.0–80.0)
Platelets: 814 10*3/uL — ABNORMAL HIGH (ref 145–400)
RBC: 5.97 10*6/uL — ABNORMAL HIGH (ref 4.20–5.70)
RDW: 17.7 % — ABNORMAL HIGH (ref 11.1–15.7)
WBC: 12.8 10*3/uL — ABNORMAL HIGH (ref 4.0–10.0)

## 2013-10-29 NOTE — Progress Notes (Deleted)
No phlebotomy needed today per d.r ennever

## 2013-10-29 NOTE — Progress Notes (Signed)
Hematology and Oncology Follow Up Visit  William Villanueva [id number] Jan 24, 1951 63 y.o. 10/29/2013   Principle Diagnosis:   Polycythemia vera-JAK2 positive  Severe back pain secondary to spinal stenosis with right leg radicular pain    Current Therapy:    Phlebotomy to maintain hematocrit below 45%  Anagrelide 1 mg by mouth daily  Aspirin 81 mg by mouth daily     Interim History:  [name] is is back for followup. He thought it had his back surgery. He had disc surgery at L4-5. He does have any further pain as right leg. He still is a back discomfort but this is probably from cuing. The incisional wound has opened up. He is on antibiotics . He has a dressing on the area.  He is feeling better. He is able walk better. He still is limited as to what he can do but at least he is getting better.  Pain is much better.  He's had no cough present no shortness of breath. He's had no leg swelling. He's had no rashes. He's had no headache.  Medications: Current outpatient prescriptions:amLODipine (NORVASC) 5 MG tablet, Take 5 mg by mouth at bedtime. , Disp: , Rfl: ;  anagrelide (AGRYLIN) 0.5 MG capsule, Take 1 capsule (0.5 mg total) by mouth daily., Disp: 60 capsule, Rfl: 4;  aspirin 81 MG tablet, Take 81 mg by mouth daily., Disp: , Rfl: ;  COLCRYS 0.6 MG tablet, Take 0.6 mg by mouth as needed. , Disp: , Rfl: ;  docusate sodium (COLACE) 100 MG capsule, Take 100 mg by mouth daily., Disp: , Rfl:  lisinopril (PRINIVIL,ZESTRIL) 40 MG tablet, Take 1 tablet (40 mg total) by mouth daily., Disp: 30 tablet, Rfl: 12;  metoprolol succinate (TOPROL-XL) 50 MG 24 hr tablet, Take 1 tablet (50 mg total) by mouth daily., Disp: 90 tablet, Rfl: 3;  omeprazole (PRILOSEC) 20 MG capsule, Take 20 mg by mouth daily., Disp: , Rfl: ;  tamsulosin (FLOMAX) 0.4 MG CAPS, Take 0.4 mg by mouth as needed. , Disp: , Rfl:  ULORIC 40 MG tablet, Take 40 mg by mouth daily. , Disp: , Rfl: ;  LORazepam (ATIVAN) 1 MG tablet, at  bedtime. , Disp: , Rfl: ;  ofloxacin (FLOXIN) 0.3 % otic solution, 5 drops as needed., Disp: , Rfl: ;  venlafaxine XR (EFFEXOR-XR) 37.5 MG 24 hr capsule, , Disp: , Rfl: ;  zolpidem (AMBIEN) 10 MG tablet, , Disp: , Rfl:  No current facility-administered medications for this visit. Facility-Administered Medications Ordered in Other Visits: 0.9 %  sodium chloride infusion, , Intravenous, Continuous, Volanda Napoleon, MD  Allergies: No Known Allergies  Past Medical History, Surgical history, Social history, and Family History were reviewed and updated.  Review of Systems: As above  Physical Exam:  height is 5' 10.5" (1.791 m) and weight is 289 lb (131.09 kg). His oral temperature is 97.8 F (36.6 C). His blood pressure is 115/76 and his pulse is 136. His respiration is 20.   Obese white woman. Lungs are clear. Cardiac exam regular in rhythm. Abdomen is soft but obese. Is no palpable liver or spleen. Exam shows a dressing on the laminectomy scar in the lumbar spine. No erythema is noted about the dressing. He has no tenderness to palpation over the site. There is no muscle spasms. Extremities shows no clubbing cyanosis or edema. His history in his legs bilaterally. Skin exam no rashes ecchymoses or petechia. Neurological exam is nonfocal.  Lab Results  Component Value Date  WBC 12.8* 10/29/2013   HGB 14.8 10/29/2013   HCT 45.5 10/29/2013   MCV 76* 10/29/2013   PLT 814* 10/29/2013     Chemistry      Component Value Date/Time   NA 137 08/06/2013 0957   NA 139 05/16/2013 1140   K 3.9 08/06/2013 0957   K 4.1 05/16/2013 1140   CL 104 08/06/2013 0957   CL 103 05/16/2013 1140   CO2 25 08/06/2013 0957   CO2 22 05/16/2013 1140   BUN 17 08/06/2013 0957   BUN 17 05/16/2013 1140   CREATININE 1.1 08/06/2013 0957   CREATININE 1.20 05/16/2013 1140      Component Value Date/Time   CALCIUM 8.7 08/06/2013 0957   CALCIUM 8.9 05/16/2013 1140   ALKPHOS 67 08/06/2013 0957   ALKPHOS 76 05/16/2013 1140   AST 38 08/06/2013 0957   AST 24  05/16/2013 1140   ALT 43 08/06/2013 0957   ALT 29 05/16/2013 1140   BILITOT 0.70 08/06/2013 0957   BILITOT 0.4 05/16/2013 1140         Impression and Plan: William Villanueva is a 63 year old coming. His polycythemia. The platelets are more elevated today. I would like think this might be an acute phase reactant. Will be interested to see what his platelet count is like more see him back.  He does not need be phlebotomized today. I think with the hematocrit of 45.5, I think were okay to hold on phlebotomy.  He is on anagrelide. I will not change anagrelide dose for right now.  I'll plan to get him back in one month. I am sure that we will have to phlebotomize him then.     Volanda Napoleon, MD 6/24/20153:21 PM

## 2013-10-29 NOTE — Progress Notes (Signed)
No phlebotomy needed today per dr. Marin Olp.

## 2013-10-30 LAB — FERRITIN: Ferritin: 17 ng/mL — ABNORMAL LOW (ref 22–322)

## 2013-10-30 LAB — COMPREHENSIVE METABOLIC PANEL
ALT: 38 U/L (ref 0–53)
AST: 29 U/L (ref 0–37)
Albumin: 4.3 g/dL (ref 3.5–5.2)
Alkaline Phosphatase: 77 U/L (ref 39–117)
BUN: 19 mg/dL (ref 6–23)
CO2: 21 mEq/L (ref 19–32)
Calcium: 9.7 mg/dL (ref 8.4–10.5)
Chloride: 105 mEq/L (ref 96–112)
Creatinine, Ser: 1.2 mg/dL (ref 0.50–1.35)
Glucose, Bld: 114 mg/dL — ABNORMAL HIGH (ref 70–99)
Potassium: 5.3 mEq/L (ref 3.5–5.3)
Sodium: 136 mEq/L (ref 135–145)
Total Bilirubin: 0.5 mg/dL (ref 0.2–1.2)
Total Protein: 7.2 g/dL (ref 6.0–8.3)

## 2013-10-30 LAB — IRON AND TIBC
%SAT: 5 % — ABNORMAL LOW (ref 20–55)
Iron: 21 ug/dL — ABNORMAL LOW (ref 42–165)
TIBC: 408 ug/dL (ref 215–435)
UIBC: 387 ug/dL (ref 125–400)

## 2013-10-30 LAB — LACTATE DEHYDROGENASE: LDH: 252 U/L — ABNORMAL HIGH (ref 94–250)

## 2013-11-06 ENCOUNTER — Encounter: Payer: Self-pay | Admitting: *Deleted

## 2013-11-17 ENCOUNTER — Encounter: Payer: Self-pay | Admitting: *Deleted

## 2013-11-17 NOTE — Progress Notes (Signed)
Per Dr Antonieta Pert notes, pt should be taking 2 Agrylin capsules a day. The [facility] called to verify this, because the prescription was wrote differently. Ailene Ravel, in pharmacy at [facility] stated the pt claims he was taking two.

## 2013-11-27 ENCOUNTER — Encounter: Payer: Self-pay | Admitting: Hematology & Oncology

## 2013-11-27 ENCOUNTER — Ambulatory Visit (HOSPITAL_BASED_OUTPATIENT_CLINIC_OR_DEPARTMENT_OTHER): Payer: 59 | Admitting: Hematology & Oncology

## 2013-11-27 ENCOUNTER — Other Ambulatory Visit (HOSPITAL_BASED_OUTPATIENT_CLINIC_OR_DEPARTMENT_OTHER): Payer: 59 | Admitting: Lab

## 2013-11-27 VITALS — BP 150/74 | HR 88 | Temp 97.8°F | Resp 18 | Ht 70.0 in | Wt 291.0 lb

## 2013-11-27 DIAGNOSIS — D45 Polycythemia vera: Secondary | ICD-10-CM

## 2013-11-27 DIAGNOSIS — G4700 Insomnia, unspecified: Secondary | ICD-10-CM

## 2013-11-27 LAB — COMPREHENSIVE METABOLIC PANEL
ALT: 36 U/L (ref 0–53)
AST: 28 U/L (ref 0–37)
Albumin: 4.1 g/dL (ref 3.5–5.2)
Alkaline Phosphatase: 67 U/L (ref 39–117)
BUN: 15 mg/dL (ref 6–23)
CO2: 24 mEq/L (ref 19–32)
Calcium: 9.6 mg/dL (ref 8.4–10.5)
Chloride: 104 mEq/L (ref 96–112)
Creatinine, Ser: 1.18 mg/dL (ref 0.50–1.35)
Glucose, Bld: 174 mg/dL — ABNORMAL HIGH (ref 70–99)
Potassium: 5.4 mEq/L — ABNORMAL HIGH (ref 3.5–5.3)
Sodium: 138 mEq/L (ref 135–145)
Total Bilirubin: 0.5 mg/dL (ref 0.2–1.2)
Total Protein: 7 g/dL (ref 6.0–8.3)

## 2013-11-27 LAB — CBC WITH DIFFERENTIAL (CANCER CENTER ONLY)
BASO#: 0.2 10*3/uL (ref 0.0–0.2)
BASO%: 1.7 % (ref 0.0–2.0)
EOS%: 3.1 % (ref 0.0–7.0)
Eosinophils Absolute: 0.4 10*3/uL (ref 0.0–0.5)
HCT: 44 % (ref 38.7–49.9)
HGB: 13.9 g/dL (ref 13.0–17.1)
LYMPH#: 2 10*3/uL (ref 0.9–3.3)
LYMPH%: 16.8 % (ref 14.0–48.0)
MCH: 24.5 pg — ABNORMAL LOW (ref 28.0–33.4)
MCHC: 31.6 g/dL — ABNORMAL LOW (ref 32.0–35.9)
MCV: 78 fL — ABNORMAL LOW (ref 82–98)
MONO#: 0.9 10*3/uL (ref 0.1–0.9)
MONO%: 8 % (ref 0.0–13.0)
NEUT#: 8.2 10*3/uL — ABNORMAL HIGH (ref 1.5–6.5)
NEUT%: 70.4 % (ref 40.0–80.0)
Platelets: 748 10*3/uL — ABNORMAL HIGH (ref 145–400)
RBC: 5.68 10*6/uL (ref 4.20–5.70)
RDW: 17.8 % — ABNORMAL HIGH (ref 11.1–15.7)
WBC: 11.6 10*3/uL — ABNORMAL HIGH (ref 4.0–10.0)

## 2013-11-27 LAB — LACTATE DEHYDROGENASE: LDH: 228 U/L (ref 94–250)

## 2013-11-27 LAB — FERRITIN CHCC: Ferritin: 13 ng/ml — ABNORMAL LOW (ref 22–316)

## 2013-11-27 LAB — CHCC SATELLITE - SMEAR

## 2013-11-27 LAB — IRON AND TIBC CHCC
%SAT: 6 % — ABNORMAL LOW (ref 20–55)
Iron: 24 ug/dL — ABNORMAL LOW (ref 42–163)
TIBC: 413 ug/dL — ABNORMAL HIGH (ref 202–409)
UIBC: 389 ug/dL — ABNORMAL HIGH (ref 117–376)

## 2013-11-27 MED ORDER — TEMAZEPAM 22.5 MG PO CAPS: 22.5000 mg | ORAL_CAPSULE | Freq: Every evening | ORAL | Status: DC | PRN

## 2013-11-27 MED ORDER — CHLORAL HYDRATE 500 MG PO CAPS: 500.0000 mg | ORAL_CAPSULE | Freq: Every evening | ORAL | Status: DC | PRN

## 2013-11-27 NOTE — Progress Notes (Signed)
Hematology and Oncology Follow Up Visit  William Villanueva [id number] [birth year] 63 y.o. 11/27/2013   Principle Diagnosis:   Polycythemia vera-JAK2 positive  Current Therapy:    Anagrelide 0.5 mg by mouth daily  Aspirin 81 mg by mouth daily  Phlebotomy to maintain hematocrit below 45%     Interim History:  [name] is back for followup. He is having more pain down the right leg again. He was doing well. He has back surgery back in May. He had the radiating pain resolved. However, it's come back. The neurosurgeon told him that this could happen. He recommended some nonsteroidals.  I think that he could benefit from a steroid taper. He has some steroids at home. I not sure what his doses. I told him to call me to let him know what dose he is taking.  He's had no headache. He's had no change in bowel or bladder habits.  He still is having a lot problems with workers compensation. They are really are becoming a huge burden for him.  He is doing some more walking which is good.  Medications: Current outpatient prescriptions:amLODipine (NORVASC) 5 MG tablet, Take 5 mg by mouth at bedtime. , Disp: , Rfl: ;  anagrelide (AGRYLIN) 1 MG capsule, Take 1 mg by mouth daily., Disp: , Rfl: ;  aspirin 81 MG tablet, Take 81 mg by mouth daily., Disp: , Rfl: ;  COLCRYS 0.6 MG tablet, Take 0.6 mg by mouth as needed. , Disp: , Rfl: ;  docusate sodium (COLACE) 100 MG capsule, Take 100 mg by mouth daily., Disp: , Rfl:  lisinopril (PRINIVIL,ZESTRIL) 20 MG tablet, Take 20 mg by mouth daily., Disp: , Rfl: ;  omeprazole (PRILOSEC) 20 MG capsule, Take 20 mg by mouth daily., Disp: , Rfl: ;  oxymorphone (OPANA) 5 MG tablet, Take 5 mg by mouth every 4 (four) hours as needed for pain., Disp: , Rfl: ;  tamsulosin (FLOMAX) 0.4 MG CAPS, Take 0.4 mg by mouth as needed. , Disp: , Rfl: ;  ULORIC 40 MG tablet, Take 40 mg by mouth daily. , Disp: , Rfl:  temazepam (RESTORIL) 22.5 MG capsule, Take 1 capsule (22.5 mg total) by  mouth at bedtime as needed for sleep., Disp: 30 capsule, Rfl: 0 No current facility-administered medications for this visit. Facility-Administered Medications Ordered in Other Visits: 0.9 %  sodium chloride infusion, , Intravenous, Continuous, Volanda Napoleon, MD  Allergies: No Known Allergies  Past Medical History, Surgical history, Social history, and Family History were reviewed and updated.  Review of Systems: As above  Physical Exam:  height is 5' 10"  (1.778 m) and weight is 291 lb (131.997 kg). His oral temperature is 97.8 F (36.6 C). His blood pressure is 150/74 and his pulse is 88. His respiration is 18.   [name]. Lungs are clear. Cardiac exam regular in rhythm. He's an occasional extra beat. Abdomen is obese but soft. Has good bowel sounds. There is no fluid wave. There is a palpable liver or spleen tip. Back exam shows a well likely scar in the lumbar spine. This well-healed. Extremities shows no clubbing cyanosis or edema. He has good range of motion of his joints. He has good strength. Skin exam no rashes. Neurological exam is nonfocal.  Lab Results  Component Value Date   WBC 11.6* 11/27/2013   HGB 13.9 11/27/2013   HCT 44.0 11/27/2013   MCV 78* 11/27/2013   PLT 748* 11/27/2013     Chemistry  Component Value Date/Time   NA 136 10/29/2013 1201   NA 137 08/06/2013 0957   K 5.3 10/29/2013 1201   K 3.9 08/06/2013 0957   CL 105 10/29/2013 1201   CL 104 08/06/2013 0957   CO2 21 10/29/2013 1201   CO2 25 08/06/2013 0957   BUN 19 10/29/2013 1201   BUN 17 08/06/2013 0957   CREATININE 1.20 10/29/2013 1201   CREATININE 1.1 08/06/2013 0957      Component Value Date/Time   CALCIUM 9.7 10/29/2013 1201   CALCIUM 8.7 08/06/2013 0957   ALKPHOS 77 10/29/2013 1201   ALKPHOS 67 08/06/2013 0957   AST 29 10/29/2013 1201   AST 38 08/06/2013 0957   ALT 38 10/29/2013 1201   ALT 43 08/06/2013 0957   BILITOT 0.5 10/29/2013 1201   BILITOT 0.70 08/06/2013 0957         Impression and Plan: [name] is 62 year old gentleman with polycythemia vera. We will go ahead and plan to follow him for right now. We will not have to phlebotomize him.  I just feel bad that he is due the workers compensation.  His platelet count is on the high side. It is better. This could be reflective of the polycythemia. It also could be reflective of iron deficiency and his recent back surgery. Since the level is coming down, I don't see we have to adjust his anagrelide dose.  I will plan to see him back in one month.  We will try him on some Restoril to see this to help him sleep.   Volanda Napoleon, MD 7/23/20152:05 PM

## 2013-12-01 ENCOUNTER — Other Ambulatory Visit: Payer: Self-pay | Admitting: Lab

## 2013-12-01 ENCOUNTER — Ambulatory Visit: Payer: Self-pay | Admitting: Hematology & Oncology

## 2013-12-01 DIAGNOSIS — D473 Essential (hemorrhagic) thrombocythemia: Secondary | ICD-10-CM | POA: Insufficient documentation

## 2013-12-01 DIAGNOSIS — D75839 Thrombocytosis, unspecified: Secondary | ICD-10-CM | POA: Insufficient documentation

## 2013-12-25 ENCOUNTER — Other Ambulatory Visit: Payer: Self-pay | Admitting: *Deleted

## 2013-12-25 DIAGNOSIS — D45 Polycythemia vera: Secondary | ICD-10-CM

## 2013-12-26 ENCOUNTER — Encounter: Payer: Self-pay | Admitting: Hematology & Oncology

## 2013-12-26 ENCOUNTER — Other Ambulatory Visit: Payer: Self-pay | Admitting: *Deleted

## 2013-12-26 ENCOUNTER — Ambulatory Visit: Payer: Self-pay

## 2013-12-26 ENCOUNTER — Ambulatory Visit (HOSPITAL_BASED_OUTPATIENT_CLINIC_OR_DEPARTMENT_OTHER): Payer: 59 | Admitting: Hematology & Oncology

## 2013-12-26 ENCOUNTER — Other Ambulatory Visit (HOSPITAL_BASED_OUTPATIENT_CLINIC_OR_DEPARTMENT_OTHER): Payer: 59 | Admitting: Lab

## 2013-12-26 VITALS — BP 114/81 | HR 93 | Temp 97.5°F | Resp 20 | Ht 69.0 in | Wt 288.0 lb

## 2013-12-26 DIAGNOSIS — D45 Polycythemia vera: Secondary | ICD-10-CM

## 2013-12-26 DIAGNOSIS — G4700 Insomnia, unspecified: Secondary | ICD-10-CM

## 2013-12-26 LAB — COMPREHENSIVE METABOLIC PANEL
ALT: 37 U/L (ref 0–53)
AST: 28 U/L (ref 0–37)
Albumin: 4.2 g/dL (ref 3.5–5.2)
Alkaline Phosphatase: 70 U/L (ref 39–117)
BUN: 20 mg/dL (ref 6–23)
CO2: 26 mEq/L (ref 19–32)
Calcium: 9.3 mg/dL (ref 8.4–10.5)
Chloride: 104 mEq/L (ref 96–112)
Creatinine, Ser: 1.44 mg/dL — ABNORMAL HIGH (ref 0.50–1.35)
Glucose, Bld: 128 mg/dL — ABNORMAL HIGH (ref 70–99)
Potassium: 5.4 mEq/L — ABNORMAL HIGH (ref 3.5–5.3)
Sodium: 140 mEq/L (ref 135–145)
Total Bilirubin: 0.6 mg/dL (ref 0.2–1.2)
Total Protein: 6.6 g/dL (ref 6.0–8.3)

## 2013-12-26 LAB — CBC WITH DIFFERENTIAL (CANCER CENTER ONLY)
BASO#: 0.4 10*3/uL — ABNORMAL HIGH (ref 0.0–0.2)
BASO%: 2.8 % — ABNORMAL HIGH (ref 0.0–2.0)
EOS%: 2.4 % (ref 0.0–7.0)
Eosinophils Absolute: 0.3 10*3/uL (ref 0.0–0.5)
HCT: 43.1 % (ref 38.7–49.9)
HGB: 13.5 g/dL (ref 13.0–17.1)
LYMPH#: 2.4 10*3/uL (ref 0.9–3.3)
LYMPH%: 18.8 % (ref 14.0–48.0)
MCH: 24 pg — ABNORMAL LOW (ref 28.0–33.4)
MCHC: 31.3 g/dL — ABNORMAL LOW (ref 32.0–35.9)
MCV: 77 fL — ABNORMAL LOW (ref 82–98)
MONO#: 1.3 10*3/uL — ABNORMAL HIGH (ref 0.1–0.9)
MONO%: 9.9 % (ref 0.0–13.0)
NEUT#: 8.4 10*3/uL — ABNORMAL HIGH (ref 1.5–6.5)
NEUT%: 66.1 % (ref 40.0–80.0)
Platelets: 826 10*3/uL — ABNORMAL HIGH (ref 145–400)
RBC: 5.62 10*6/uL (ref 4.20–5.70)
RDW: 18.7 % — ABNORMAL HIGH (ref 11.1–15.7)
WBC: 12.7 10*3/uL — ABNORMAL HIGH (ref 4.0–10.0)

## 2013-12-26 LAB — IRON AND TIBC CHCC
%SAT: 7 % — ABNORMAL LOW (ref 20–55)
Iron: 29 ug/dL — ABNORMAL LOW (ref 42–163)
TIBC: 413 ug/dL — ABNORMAL HIGH (ref 202–409)
UIBC: 383 ug/dL — ABNORMAL HIGH (ref 117–376)

## 2013-12-26 LAB — FERRITIN CHCC: Ferritin: 15 ng/ml — ABNORMAL LOW (ref 22–316)

## 2013-12-26 MED ORDER — ANAGRELIDE HCL 1 MG PO CAPS: 2.0000 mg | ORAL_CAPSULE | Freq: Every day | ORAL | Status: DC

## 2013-12-26 MED ORDER — TEMAZEPAM 22.5 MG PO CAPS: 22.5000 mg | ORAL_CAPSULE | Freq: Every evening | ORAL | Status: DC | PRN

## 2013-12-26 NOTE — Progress Notes (Signed)
No phlebotomy today due to HCT < 45

## 2013-12-26 NOTE — Progress Notes (Signed)
Hematology and Oncology Follow Up Visit  William Villanueva [id number] 1951/03/06 63 y.o. 12/26/2013   Principle Diagnosis:   Polycythemia vera-JAK2 positive  Current Therapy:    Anagrelide 1 mg by mouth daily  Aspirin 81 mg by mouth daily  Phlebotomy to maintain hematocrit below 45%     Interim History:  [name] is back for followup. He is feeling better with his back. It is not hurting as much. He will be going for some additional testing. He may get a stress test for his back to see about getting back to work.  The Restoril has definitely helped with his sleep. He says he finally sleep for 4 or 5 hours.  He's had no problems with bleeding. He's had no fever. He's had no change in bowel or bladder habits.  There's been no issues with gout. Medications: Current outpatient prescriptions:amLODipine (NORVASC) 5 MG tablet, Take 5 mg by mouth at bedtime. , Disp: , Rfl: ;  anagrelide (AGRYLIN) 1 MG capsule, Take 2 capsules (2 mg total) by mouth daily., Disp: 60 capsule, Rfl: 6;  aspirin 81 MG tablet, Take 81 mg by mouth daily., Disp: , Rfl: ;  COLCRYS 0.6 MG tablet, Take 0.6 mg by mouth as needed. , Disp: , Rfl: ;  docusate sodium (COLACE) 100 MG capsule, Take 100 mg by mouth daily., Disp: , Rfl:  lisinopril (PRINIVIL,ZESTRIL) 20 MG tablet, Take 20 mg by mouth daily., Disp: , Rfl: ;  omeprazole (PRILOSEC) 20 MG capsule, Take 20 mg by mouth daily., Disp: , Rfl: ;  oxymorphone (OPANA) 5 MG tablet, Take 5 mg by mouth every 4 (four) hours as needed for pain., Disp: , Rfl: ;  tamsulosin (FLOMAX) 0.4 MG CAPS, Take 0.4 mg by mouth as needed. , Disp: , Rfl:  temazepam (RESTORIL) 22.5 MG capsule, Take 1 capsule (22.5 mg total) by mouth at bedtime as needed for sleep., Disp: 30 capsule, Rfl: 2;  ULORIC 40 MG tablet, Take 40 mg by mouth daily. , Disp: , Rfl:  No current facility-administered medications for this visit. Facility-Administered Medications Ordered in Other Visits: 0.9 %  sodium chloride  infusion, , Intravenous, Continuous, Volanda Napoleon, MD  Allergies: No Known Allergies  Past Medical History, Surgical history, Social history, and Family History were reviewed and updated.  Review of Systems: As above  Physical Exam:  height is 5' 9"  (1.753 m) and weight is 288 lb (130.636 kg). His oral temperature is 97.5 F (36.4 C). His blood pressure is 114/81 and his pulse is 93. His respiration is 20.   [name]. Lungs are clear. Cardiac exam regular in rhythm. He's an occasional extra beat. Abdomen is obese but soft. Has good bowel sounds. There is no fluid wave. There is a palpable liver or spleen tip. Back exam shows a well likely scar in the lumbar spine. This well-healed. Extremities shows no clubbing cyanosis or edema. He has good range of motion of his joints. He has good strength. Skin exam no rashes. Neurological exam is nonfocal.  Lab Results  Component Value Date   WBC 12.7* 12/26/2013   HGB 13.5 12/26/2013   HCT 43.1 12/26/2013   MCV 77* 12/26/2013   PLT 826* 12/26/2013     Chemistry      Component Value Date/Time   NA 138 11/27/2013 0846   NA 137 08/06/2013 0957   K 5.4* 11/27/2013 0846   K 3.9 08/06/2013 0957   CL 104 11/27/2013 0846   CL 104 08/06/2013  0957   CO2 24 11/27/2013 0846   CO2 25 08/06/2013 0957   BUN 15 11/27/2013 0846   BUN 17 08/06/2013 0957   CREATININE 1.18 11/27/2013 0846   CREATININE 1.1 08/06/2013 0957      Component Value Date/Time   CALCIUM 9.6 11/27/2013 0846   CALCIUM 8.7 08/06/2013 0957   ALKPHOS 67 11/27/2013 0846   ALKPHOS 67 08/06/2013 0957   AST 28 11/27/2013 0846   AST 38 08/06/2013 0957   ALT 36 11/27/2013 0846   ALT 43 08/06/2013 0957   BILITOT 0.5 11/27/2013 0846   BILITOT 0.70 08/06/2013 0957         Impression and Plan: [name] is 63 year old gentleman with polycythemia vera. He does need be phlebotomized today.  His platelet count continues to go up. I will have to increase his dose of anagrelide up to 2 mg a day. Hopefully,  this will get his platelet count down.   He will be seen in another 6 weeks.  Volanda Napoleon, MD 8/21/20158:55 AM

## 2014-01-25 ENCOUNTER — Emergency Department (HOSPITAL_BASED_OUTPATIENT_CLINIC_OR_DEPARTMENT_OTHER)
Admission: EM | Admit: 2014-01-25 | Discharge: 2014-01-25 | Disposition: A | Discharge status: Home / Self Care | Payer: 59 | Attending: Emergency Medicine | Admitting: Emergency Medicine

## 2014-01-25 ENCOUNTER — Emergency Department (HOSPITAL_BASED_OUTPATIENT_CLINIC_OR_DEPARTMENT_OTHER): Payer: 59

## 2014-01-25 ENCOUNTER — Encounter (HOSPITAL_BASED_OUTPATIENT_CLINIC_OR_DEPARTMENT_OTHER): Payer: Self-pay | Admitting: Emergency Medicine

## 2014-01-25 DIAGNOSIS — R197 Diarrhea, unspecified: Secondary | ICD-10-CM | POA: Diagnosis present

## 2014-01-25 DIAGNOSIS — Z87891 Personal history of nicotine dependence: Secondary | ICD-10-CM | POA: Diagnosis not present

## 2014-01-25 DIAGNOSIS — Z8679 Personal history of other diseases of the circulatory system: Secondary | ICD-10-CM | POA: Diagnosis not present

## 2014-01-25 DIAGNOSIS — N289 Disorder of kidney and ureter, unspecified: Secondary | ICD-10-CM

## 2014-01-25 DIAGNOSIS — Z87442 Personal history of urinary calculi: Secondary | ICD-10-CM | POA: Insufficient documentation

## 2014-01-25 DIAGNOSIS — Z7982 Long term (current) use of aspirin: Secondary | ICD-10-CM | POA: Insufficient documentation

## 2014-01-25 DIAGNOSIS — E669 Obesity, unspecified: Secondary | ICD-10-CM | POA: Diagnosis not present

## 2014-01-25 DIAGNOSIS — Q6102 Congenital multiple renal cysts: Secondary | ICD-10-CM

## 2014-01-25 DIAGNOSIS — N281 Cyst of kidney, acquired: Secondary | ICD-10-CM | POA: Diagnosis not present

## 2014-01-25 DIAGNOSIS — R1084 Generalized abdominal pain: Secondary | ICD-10-CM | POA: Diagnosis not present

## 2014-01-25 DIAGNOSIS — Z79899 Other long term (current) drug therapy: Secondary | ICD-10-CM | POA: Diagnosis not present

## 2014-01-25 LAB — COMPREHENSIVE METABOLIC PANEL
ALT: 39 U/L (ref 0–53)
AST: 33 U/L (ref 0–37)
Albumin: 4 g/dL (ref 3.5–5.2)
Alkaline Phosphatase: 90 U/L (ref 39–117)
Anion gap: 15 (ref 5–15)
BUN: 20 mg/dL (ref 6–23)
CO2: 21 mEq/L (ref 19–32)
Calcium: 9.3 mg/dL (ref 8.4–10.5)
Chloride: 103 mEq/L (ref 96–112)
Creatinine, Ser: 1.7 mg/dL — ABNORMAL HIGH (ref 0.50–1.35)
GFR calc Af Amer: 48 mL/min — ABNORMAL LOW (ref >90)
GFR calc non Af Amer: 41 mL/min — ABNORMAL LOW (ref >90)
Glucose, Bld: 65 mg/dL — ABNORMAL LOW (ref 70–99)
Potassium: 4.4 mEq/L (ref 3.7–5.3)
Sodium: 139 mEq/L (ref 137–147)
Total Bilirubin: 0.5 mg/dL (ref 0.3–1.2)
Total Protein: 7.9 g/dL (ref 6.0–8.3)

## 2014-01-25 LAB — URINE MICROSCOPIC-ADD ON

## 2014-01-25 LAB — CBC WITH DIFFERENTIAL/PLATELET
Basophils Absolute: 0.2 10*3/uL — ABNORMAL HIGH (ref 0.0–0.1)
Basophils Relative: 1 % (ref 0–1)
Eosinophils Absolute: 0.3 10*3/uL (ref 0.0–0.7)
Eosinophils Relative: 2 % (ref 0–5)
HCT: 44.6 % (ref 39.0–52.0)
Hemoglobin: 14 g/dL (ref 13.0–17.0)
Lymphocytes Relative: 13 % (ref 12–46)
Lymphs Abs: 2 10*3/uL (ref 0.7–4.0)
MCH: 23.8 pg — ABNORMAL LOW (ref 26.0–34.0)
MCHC: 31.4 g/dL (ref 30.0–36.0)
MCV: 75.7 fL — ABNORMAL LOW (ref 78.0–100.0)
Monocytes Absolute: 1.6 10*3/uL — ABNORMAL HIGH (ref 0.1–1.0)
Monocytes Relative: 11 % (ref 3–12)
Neutro Abs: 10.7 10*3/uL — ABNORMAL HIGH (ref 1.7–7.7)
Neutrophils Relative %: 73 % (ref 43–77)
Platelets: 649 10*3/uL — ABNORMAL HIGH (ref 150–400)
RBC: 5.89 MIL/uL — ABNORMAL HIGH (ref 4.22–5.81)
RDW: 19.6 % — ABNORMAL HIGH (ref 11.5–15.5)
WBC: 14.7 10*3/uL — ABNORMAL HIGH (ref 4.0–10.5)

## 2014-01-25 LAB — URINALYSIS, ROUTINE W REFLEX MICROSCOPIC
Glucose, UA: NEGATIVE mg/dL
Hgb urine dipstick: NEGATIVE
Ketones, ur: 15 mg/dL — AB
Nitrite: NEGATIVE
Protein, ur: 30 mg/dL — AB
Specific Gravity, Urine: 1.024 (ref 1.005–1.030)
Urobilinogen, UA: 1 mg/dL (ref 0.0–1.0)
pH: 5 (ref 5.0–8.0)

## 2014-01-25 LAB — LIPASE, BLOOD: Lipase: 34 U/L (ref 11–59)

## 2014-01-25 MED ORDER — DEXTROSE 50 % IV SOLN
25.0000 mL | Freq: Once | INTRAVENOUS | Status: AC
  Administered 2014-01-25: 25 mL via INTRAVENOUS
  Filled 2014-01-25: qty 50

## 2014-01-25 MED ORDER — PANTOPRAZOLE SODIUM 20 MG PO TBEC: 20.0000 mg | DELAYED_RELEASE_TABLET | Freq: Every day | ORAL | Status: DC

## 2014-01-25 MED ORDER — IOHEXOL 300 MG/ML  SOLN: 100.0000 mL | Freq: Once | INTRAMUSCULAR | Status: DC | PRN

## 2014-01-25 MED ORDER — SODIUM CHLORIDE 0.9 % IV SOLN
1000.0000 mL | Freq: Once | INTRAVENOUS | Status: AC
  Administered 2014-01-25: 1000 mL via INTRAVENOUS

## 2014-01-25 MED ORDER — ONDANSETRON HCL 4 MG/2ML IJ SOLN
4.0000 mg | Freq: Once | INTRAMUSCULAR | Status: AC
  Administered 2014-01-25: 4 mg via INTRAVENOUS
  Filled 2014-01-25: qty 2

## 2014-01-25 MED ORDER — SODIUM CHLORIDE 0.9 % IV SOLN
1000.0000 mL | INTRAVENOUS | Status: DC
  Administered 2014-01-25: 1000 mL via INTRAVENOUS

## 2014-01-25 MED ORDER — HYDROMORPHONE HCL 1 MG/ML IJ SOLN
1.0000 mg | Freq: Once | INTRAMUSCULAR | Status: AC
  Administered 2014-01-25: 1 mg via INTRAVENOUS
  Filled 2014-01-25: qty 1

## 2014-01-25 MED ORDER — IOHEXOL 300 MG/ML  SOLN: 50.0000 mL | Freq: Once | INTRAMUSCULAR | Status: DC | PRN

## 2014-01-25 NOTE — Discharge Instructions (Signed)

## 2014-01-25 NOTE — ED Provider Notes (Signed)
Results for orders placed during the hospital encounter of 01/25/14  CBC WITH DIFFERENTIAL      Result Value Ref Range   WBC 14.7 (*) 4.0 - 10.5 K/uL   RBC 5.89 (*) 4.22 - 5.81 MIL/uL   Hemoglobin 14.0  13.0 - 17.0 g/dL   HCT 44.6  39.0 - 52.0 %   MCV 75.7 (*) 78.0 - 100.0 fL   MCH 23.8 (*) 26.0 - 34.0 pg   MCHC 31.4  30.0 - 36.0 g/dL   RDW 19.6 (*) 11.5 - 15.5 %   Platelets 649 (*) 150 - 400 K/uL   Neutrophils Relative % 73  43 - 77 %   Neutro Abs 10.7 (*) 1.7 - 7.7 K/uL   Lymphocytes Relative 13  12 - 46 %   Lymphs Abs 2.0  0.7 - 4.0 K/uL   Monocytes Relative 11  3 - 12 %   Monocytes Absolute 1.6 (*) 0.1 - 1.0 K/uL   Eosinophils Relative 2  0 - 5 %   Eosinophils Absolute 0.3  0.0 - 0.7 K/uL   Basophils Relative 1  0 - 1 %   Basophils Absolute 0.2 (*) 0.0 - 0.1 K/uL  COMPREHENSIVE METABOLIC PANEL      Result Value Ref Range   Sodium 139  137 - 147 mEq/L   Potassium 4.4  3.7 - 5.3 mEq/L   Chloride 103  96 - 112 mEq/L   CO2 21  19 - 32 mEq/L   Glucose, Bld 65 (*) 70 - 99 mg/dL   BUN 20  6 - 23 mg/dL   Creatinine, Ser 1.70 (*) 0.50 - 1.35 mg/dL   Calcium 9.3  8.4 - 10.5 mg/dL   Total Protein 7.9  6.0 - 8.3 g/dL   Albumin 4.0  3.5 - 5.2 g/dL   AST 33  0 - 37 U/L   ALT 39  0 - 53 U/L   Alkaline Phosphatase 90  39 - 117 U/L   Total Bilirubin 0.5  0.3 - 1.2 mg/dL   GFR calc non Af Amer 41 (*) >90 mL/min   GFR calc Af Amer 48 (*) >90 mL/min   Anion gap 15  5 - 15  LIPASE, BLOOD      Result Value Ref Range   Lipase 34  11 - 59 U/L  URINALYSIS, ROUTINE W REFLEX MICROSCOPIC      Result Value Ref Range   Color, Urine AMBER (*) YELLOW   APPearance CLOUDY (*) CLEAR   Specific Gravity, Urine 1.024  1.005 - 1.030   pH 5.0  5.0 - 8.0   Glucose, UA NEGATIVE  NEGATIVE mg/dL   Hgb urine dipstick NEGATIVE  NEGATIVE   Bilirubin Urine SMALL (*) NEGATIVE   Ketones, ur 15 (*) NEGATIVE mg/dL   Protein, ur 30 (*) NEGATIVE mg/dL   Urobilinogen, UA 1.0  0.0 - 1.0 mg/dL   Nitrite NEGATIVE   NEGATIVE   Leukocytes, UA SMALL (*) NEGATIVE  URINE MICROSCOPIC-ADD ON      Result Value Ref Range   WBC, UA 3-6  <3 WBC/hpf   Bacteria, UA MANY (*) RARE   Casts HYALINE CASTS (*) NEGATIVE   Crystals CA OXALATE CRYSTALS (*) NEGATIVE   Urine-Other MUCOUS PRESENT     Ct Abdomen Pelvis Wo Contrast  01/25/2014   ADDENDUM REPORT: 01/25/2014 17:51  ADDENDUM: The original report was by Dr. Van Clines. The following addendum is by Dr. Van Clines:  The prior CT scan has been temporarily  restored to [facility], allowing me to compare to today's current noncontrast CT.  The prior exam has a wide variety of measurement markings within the cysts burned in to the images, making it to where I cannot measure them myself for enhancement on that prior exam. Frankly, the best way to assess these complex renal cysts would be renal protocol MRI with and without contrast. Today's noncontrast exam is not going to be that useful in characterizing these complex renal cysts. The right lower pole posterior cyst does appear larger, previously about 12 mm in currently 18 mm, but measures at fluid density.   Electronically Signed   By: Sherryl Barters M.D.   On: 01/25/2014 17:51   01/25/2014   CLINICAL DATA:  Diarrhea, weakness, and abdominal distention. Mild leukocytosis and mild renal insufficiency.  EXAM: CT ABDOMEN AND PELVIS WITHOUT CONTRAST  TECHNIQUE: Multidetector CT imaging of the abdomen and pelvis was performed following the standard protocol without IV contrast.  COMPARISON:  The prior exam from 10/14/2012 is not available to me to read. It has fallen off of [employer] [facility] due to the read and drop arrangement. I having engaged [employer] assistance by leaving a voice mail message to help restore this study so that I can compare to it. I will issue an addendum when that is done.  FINDINGS: Lower chest:  Mild lingular atelectasis.  Hepatobiliary: Mildly contracted gallbladder.  Spleen: Unremarkable   Pancreas: Unremarkable  Stomach/Bowel: Unremarkable.  Normal appendix.  Adrenals/urinary tract: Exophytic right kidney lower pole lesion, 1.8 cm. Exophytic mildly hyperdense left mid kidney lesion, 1.5 cm in diameter, internal density 39 Hounsfield units. Exophytic 3.8 by 3.1 cm left mid kidney mildly hyperdense lesion, 16 Hounsfield units, image 31 series 2. 1 cm right kidney lower pole hyperdense exophytic lesion.  2 mm right kidney lower pole nonobstructive calculus. 1 mm right mid kidney nonobstructive calculus.  Vascular/Lymphatic: Aortoiliac atherosclerotic vascular disease.  Reproductive: Several prostate calcifications are observed. Seminal vesicle symmetric.  Musculoskeletal: Mild degenerative spurring of the hips and sacroiliac joints. Spondylosis and degenerative disc disease causing foraminal stenosis bilaterally at L4-5 and L5-S1.  IMPRESSION: 1. Nonobstructive right nephrolithiasis. 2. Bilateral complex renal lesions, quite likely complex cysts. The posterior left mid kidney lesion was previously measured 1.2 cm and currently measures 1.5 cm. Technically speaking, the complex lesions could be renal masses. I am awaiting technical assistance to load the prior case so that I can compare to it. The prior CT from 10/14/2012 is not available to me to compare. 3.  Aortoiliac atherosclerotic vascular disease. 4. Lower lumbar foraminal stenosis due to spondylosis. 5. Mild atelectasis in the lingula.  Electronically Signed: By: Sherryl Barters M.D. On: 01/25/2014 17:29    Care assumed from Dr. Tomi Bamberger.  Pt with generalized abd pain, diarrhea.  Pain has been going on for a couple of months.  PMD has questioned PUD, but insurance would not pay for his prilosec, so he stopped taking it.  Has had diarrhea for one week, but has improved over last couple of days, more formed now.  Ct does not show anything to explain his symptoms.  Pt well appearing, abd exam benign.  Discussed with pt having close f/u with his  pmd.  Will need to have an MR of his kidneys to further characterize cysts and will need to have his creatinine rechecked as it is higher today than his prior values.  CT today done with only oral contrast given elevated creatinine.  Pt given rx  for protonix.  Malvin Johns, MD 01/25/14 [facility]

## 2014-01-25 NOTE — ED Notes (Signed)
Patient c/o diarrhea for the past week and went to urgent care today because he felt dizzy when standing. Able to eat and drink but still is having diarrhea

## 2014-01-25 NOTE — ED Provider Notes (Signed)
CSN: [id number]     Arrival date & time 01/25/14  1251 History   First MD Initiated Contact with Patient 01/25/14 1319     Chief Complaint  Patient presents with  . Diarrhea  . Weakness   HPI The patient has a history of chronic back pain requiring surgery. He has been out of work for approximately one year. Over the last  month he started to have trouble with diffuse abdominal pain as well. The symptoms have not been here. He saw his primary doctor and they tried some antacids. He had some relief with a prescription and acid but when his insurance and change to an over-the-counter form he felt like his symptoms started to get worse again. In the last week he started having significant amounts of loose stools and diarrhea. He was recently started on Augmentin this week but his symptoms preceded the Augmentin by a couple of days. He went to see his primary doctor today his blood pressure was low. They suggested he come to the emergency room to get a complete evaluation. Past Medical History  Diagnosis Date  . Polycythemia vera(238.4)   . Back pain   . Hemochromatosis   . Gout   . Hypertension   . Nephrolithiasis   . Cardiomyopathy    Past Surgical History  Procedure Laterality Date  . Ankle surgery    . Pilonidal cyst excision    . Tonsillectomy     Family History  Problem Relation Age of Onset  . Heart disease      No family history   History  Substance Use Topics  . Smoking status: Former Smoker -- 1.00 packs/day for 44 years    Types: Cigarettes    Start date: 06/05/1968    Quit date: 04/05/2013  . Smokeless tobacco: Never Used     Comment: quit October 2014  . Alcohol Use: Yes     Comment: Occasional    Review of Systems  All other systems reviewed and are negative.     Allergies  Review of patient's allergies indicates no known allergies.  Home Medications   Prior to Admission medications   Medication Sig Start Date End Date Taking? Authorizing Provider   amLODipine (NORVASC) 5 MG tablet Take 5 mg by mouth at bedtime.  05/13/13   Historical Provider, MD  anagrelide (AGRYLIN) 1 MG capsule Take 2 capsules (2 mg total) by mouth daily. 12/26/13   Volanda Napoleon, MD  aspirin 81 MG tablet Take 81 mg by mouth daily.    Historical Provider, MD  COLCRYS 0.6 MG tablet Take 0.6 mg by mouth as needed.  03/26/13   Historical Provider, MD  docusate sodium (COLACE) 100 MG capsule Take 100 mg by mouth daily.    Historical Provider, MD  lisinopril (PRINIVIL,ZESTRIL) 20 MG tablet Take 20 mg by mouth daily.    Historical Provider, MD  omeprazole (PRILOSEC) 20 MG capsule Take 20 mg by mouth daily.    Historical Provider, MD  oxymorphone (OPANA) 5 MG tablet Take 5 mg by mouth every 4 (four) hours as needed for pain.    Historical Provider, MD  tamsulosin (FLOMAX) 0.4 MG CAPS Take 0.4 mg by mouth as needed.     Historical Provider, MD  temazepam (RESTORIL) 22.5 MG capsule Take 1 capsule (22.5 mg total) by mouth at bedtime as needed for sleep. 12/26/13   Volanda Napoleon, MD  ULORIC 40 MG tablet Take 40 mg by mouth daily.  04/22/13   Historical  Provider, MD   BP 115/71  Pulse 88  Temp(Src) 97.5 F (36.4 C)  Resp 20  SpO2 96% Physical Exam  Nursing note and vitals reviewed. Constitutional: He appears well-developed and well-nourished. No distress.  Obese  HENT:  Head: Normocephalic and atraumatic.  Right Ear: External ear normal.  Left Ear: External ear normal.  Eyes: Conjunctivae are normal. Right eye exhibits no discharge. Left eye exhibits no discharge. No scleral icterus.  Neck: Neck supple. No tracheal deviation present.  Cardiovascular: Normal rate, regular rhythm and intact distal pulses.   Pulmonary/Chest: Effort normal and breath sounds normal. No stridor. No respiratory distress. He has no wheezes. He has no rales.  Abdominal: Soft. Bowel sounds are normal. He exhibits no distension and no pulsatile midline mass. There is generalized tenderness. There  is no rebound and no guarding. No hernia.  Musculoskeletal: He exhibits no edema and no tenderness.  Neurological: He is alert. He has normal strength. No cranial nerve deficit (no facial droop, extraocular movements intact, no slurred speech) or sensory deficit. He exhibits normal muscle tone. He displays no seizure activity. Coordination normal.  Skin: Skin is warm and dry. No rash noted.  Psychiatric: He has a normal mood and affect.    ED Course  Procedures (including critical care time) Labs Review Labs Reviewed  CBC WITH DIFFERENTIAL - Abnormal; Notable for the following:    WBC 14.7 (*)    RBC 5.89 (*)    MCV 75.7 (*)    MCH 23.8 (*)    RDW 19.6 (*)    Platelets 649 (*)    Neutro Abs 10.7 (*)    Monocytes Absolute 1.6 (*)    Basophils Absolute 0.2 (*)    All other components within normal limits  COMPREHENSIVE METABOLIC PANEL - Abnormal; Notable for the following:    Glucose, Bld 65 (*)    Creatinine, Ser 1.70 (*)    GFR calc non Af Amer 41 (*)    GFR calc Af Amer 48 (*)    All other components within normal limits  LIPASE, BLOOD  URINALYSIS, ROUTINE W REFLEX MICROSCOPIC   Medications  0.9 %  sodium chloride infusion (0 mLs Intravenous Stopped 01/25/14 1445)    Followed by  0.9 %  sodium chloride infusion (1,000 mLs Intravenous New Bag/Given 01/25/14 1445)  ondansetron (ZOFRAN) injection 4 mg (4 mg Intravenous Given 01/25/14 1408)  HYDROmorphone (DILAUDID) injection 1 mg (1 mg Intravenous Given 01/25/14 1410)     MDM  Pt has been having diarrhea and abdominal pain.  Elevated WBC.  Abdomen with mild diffuse ttp.  ? Colitis. ?diverticulitis.  Will ct to evaluate further.  Case turned over to Dr Tamera Punt pending ct scan.   Dorie Rank, MD 01/25/14 [phone]

## 2014-02-06 ENCOUNTER — Ambulatory Visit (HOSPITAL_BASED_OUTPATIENT_CLINIC_OR_DEPARTMENT_OTHER): Payer: 59 | Admitting: Hematology & Oncology

## 2014-02-06 ENCOUNTER — Encounter: Payer: Self-pay | Admitting: Hematology & Oncology

## 2014-02-06 ENCOUNTER — Ambulatory Visit: Payer: 59

## 2014-02-06 ENCOUNTER — Other Ambulatory Visit (HOSPITAL_BASED_OUTPATIENT_CLINIC_OR_DEPARTMENT_OTHER): Payer: 59 | Admitting: Lab

## 2014-02-06 VITALS — BP 124/71 | HR 101 | Temp 97.3°F | Resp 20 | Ht 69.0 in | Wt 287.0 lb

## 2014-02-06 DIAGNOSIS — D45 Polycythemia vera: Secondary | ICD-10-CM

## 2014-02-06 DIAGNOSIS — G4700 Insomnia, unspecified: Secondary | ICD-10-CM

## 2014-02-06 LAB — CBC WITH DIFFERENTIAL (CANCER CENTER ONLY)
BASO#: 0.2 10*3/uL (ref 0.0–0.2)
BASO%: 1.8 % (ref 0.0–2.0)
EOS%: 3.3 % (ref 0.0–7.0)
Eosinophils Absolute: 0.3 10*3/uL (ref 0.0–0.5)
HCT: 42.9 % (ref 38.7–49.9)
HGB: 13.5 g/dL (ref 13.0–17.1)
LYMPH#: 1.8 10*3/uL (ref 0.9–3.3)
LYMPH%: 17.2 % (ref 14.0–48.0)
MCH: 23.9 pg — ABNORMAL LOW (ref 28.0–33.4)
MCHC: 31.5 g/dL — ABNORMAL LOW (ref 32.0–35.9)
MCV: 76 fL — ABNORMAL LOW (ref 82–98)
MONO#: 0.9 10*3/uL (ref 0.1–0.9)
MONO%: 8.6 % (ref 0.0–13.0)
NEUT#: 7.2 10*3/uL — ABNORMAL HIGH (ref 1.5–6.5)
NEUT%: 69.1 % (ref 40.0–80.0)
Platelets: 394 10*3/uL (ref 145–400)
RBC: 5.64 10*6/uL (ref 4.20–5.70)
RDW: 18.5 % — ABNORMAL HIGH (ref 11.1–15.7)
WBC: 10.4 10*3/uL — ABNORMAL HIGH (ref 4.0–10.0)

## 2014-02-06 LAB — CMP (CANCER CENTER ONLY)
ALT(SGPT): 31 U/L (ref 10–47)
AST: 27 U/L (ref 11–38)
Albumin: 3.4 g/dL (ref 3.3–5.5)
Alkaline Phosphatase: 65 U/L (ref 26–84)
BUN, Bld: 17 mg/dL (ref 7–22)
CO2: 24 mEq/L (ref 18–33)
Calcium: 8.5 mg/dL (ref 8.0–10.3)
Chloride: 105 mEq/L (ref 98–108)
Creat: 1.4 mg/dl — ABNORMAL HIGH (ref 0.6–1.2)
Glucose, Bld: 161 mg/dL — ABNORMAL HIGH (ref 73–118)
Potassium: 4.1 mEq/L (ref 3.3–4.7)
Sodium: 140 mEq/L (ref 128–145)
Total Bilirubin: 0.7 mg/dl (ref 0.20–1.60)
Total Protein: 6.9 g/dL (ref 6.4–8.1)

## 2014-02-06 LAB — LACTATE DEHYDROGENASE: LDH: 186 U/L (ref 94–250)

## 2014-02-06 LAB — CHCC SATELLITE - SMEAR

## 2014-02-06 MED ORDER — ANAGRELIDE HCL 1 MG PO CAPS: 1.0000 mg | ORAL_CAPSULE | Freq: Every day | ORAL | Status: DC

## 2014-02-06 NOTE — Progress Notes (Signed)
Hematology and Oncology Follow Up Visit  William Villanueva [id number] [birth year] 63 y.o. 02/06/2014   Principle Diagnosis:   Polycythemia vera-JAK2 positive  Current Therapy:    Anagrelide 1 mg by mouth daily  Aspirin 81 mg by mouth daily  Phlebotomy to maintain hematocrit below 45%     Interim History:  William Villanueva is back for followup. He is feeling a little better. His back does not hurt as much.  He is a problems with blood pressure. It seems as as if his blood pressure has been going down too much. He's on both Norvasc and lisinopril. I told him to stop the lisinopril.  He goes back for the neurosurgeon I think next week.  He's been evaluated for work. It does not sound like he is going to to go back to what he normally does.  I that he is sleeping a little better. He is on Restoril for this. He's had no problems with bleeding. He's had no fever. He's had no change in bowel or bladder habits.  There's been no issues with gout. Medications: Current outpatient prescriptions:amLODipine (NORVASC) 5 MG tablet, Take 5 mg by mouth at bedtime. , Disp: , Rfl: ;  anagrelide (AGRYLIN) 1 MG capsule, Take 1 capsule (1 mg total) by mouth daily., Disp: 60 capsule, Rfl: 6;  aspirin 81 MG tablet, Take 81 mg by mouth daily., Disp: , Rfl: ;  COLCRYS 0.6 MG tablet, Take 0.6 mg by mouth as needed. , Disp: , Rfl: ;  docusate sodium (COLACE) 100 MG capsule, Take 100 mg by mouth as needed. , Disp: , Rfl:  omeprazole (PRILOSEC) 20 MG capsule, Take 20 mg by mouth daily., Disp: , Rfl: ;  oxymorphone (OPANA) 5 MG tablet, Take 5 mg by mouth every 4 (four) hours as needed for pain., Disp: , Rfl: ;  tamsulosin (FLOMAX) 0.4 MG CAPS, Take 0.4 mg by mouth as needed. , Disp: , Rfl: ;  temazepam (RESTORIL) 22.5 MG capsule, Take 1 capsule (22.5 mg total) by mouth at bedtime as needed for sleep., Disp: 30 capsule, Rfl: 2 ULORIC 40 MG tablet, Take 40 mg by mouth daily. , Disp: , Rfl:  No current facility-administered  medications for this visit. Facility-Administered Medications Ordered in Other Visits: 0.9 %  sodium chloride infusion, , Intravenous, Continuous, Volanda Napoleon, MD  Allergies: No Known Allergies  Past Medical History, Surgical history, Social history, and Family History were reviewed and updated.  Review of Systems: As above  Physical Exam:  height is 5' 9"  (1.753 m) and weight is 287 lb (130.182 kg). His oral temperature is 97.3 F (36.3 C). His blood pressure is 124/71 and his pulse is 101. His respiration is 20.   Obese white [name]. Lungs are clear. Cardiac exam regular in rhythm. He's an occasional extra beat. Abdomen is obese but soft. Has good bowel sounds. There is no fluid wave. There is a palpable liver or spleen tip. Back exam shows a well likely scar in the lumbar spine. This well-healed. Extremities shows no clubbing cyanosis or edema. He has good range of motion of his joints. He has good strength. Skin exam no rashes. Neurological exam is nonfocal.  Lab Results  Component Value Date   WBC 10.4* 02/06/2014   HGB 13.5 02/06/2014   HCT 42.9 02/06/2014   MCV 76* 02/06/2014   PLT 394 02/06/2014     Chemistry      Component Value Date/Time   NA 140 02/06/2014 0753   NA  139 01/25/2014 1400   K 4.1 02/06/2014 0753   K 4.4 01/25/2014 1400   CL 105 02/06/2014 0753   CL 103 01/25/2014 1400   CO2 24 02/06/2014 0753   CO2 21 01/25/2014 1400   BUN 17 02/06/2014 0753   BUN 20 01/25/2014 1400   CREATININE 1.4* 02/06/2014 0753   CREATININE 1.70* 01/25/2014 1400      Component Value Date/Time   CALCIUM 8.5 02/06/2014 0753   CALCIUM 9.3 01/25/2014 1400   ALKPHOS 65 02/06/2014 0753   ALKPHOS 90 01/25/2014 1400   AST 27 02/06/2014 0753   AST 33 01/25/2014 1400   ALT 31 02/06/2014 0753   ALT 39 01/25/2014 1400   BILITOT 0.70 02/06/2014 0753   BILITOT 0.5 01/25/2014 1400         Impression and Plan: William Villanueva is 63 year old gentleman with polycythemia vera. He does need be phlebotomized  today.  His platelet count is better. I will cut his anagrelide dose down to 1 mg a day.  I will plan to get him back to see him in another 4-6 weeks. Volanda Napoleon, MD 10/2/201511:35 AM

## 2014-02-06 NOTE — Progress Notes (Signed)
HCT 42.9. No phlebotomy today per Dr Antonieta Pert order.

## 2014-02-09 DIAGNOSIS — M5416 Radiculopathy, lumbar region: Secondary | ICD-10-CM | POA: Insufficient documentation

## 2014-02-12 DIAGNOSIS — G8929 Other chronic pain: Secondary | ICD-10-CM | POA: Insufficient documentation

## 2014-03-18 ENCOUNTER — Ambulatory Visit: Payer: Self-pay

## 2014-03-18 ENCOUNTER — Other Ambulatory Visit (HOSPITAL_BASED_OUTPATIENT_CLINIC_OR_DEPARTMENT_OTHER): Payer: 59 | Admitting: Lab

## 2014-03-18 ENCOUNTER — Ambulatory Visit (HOSPITAL_BASED_OUTPATIENT_CLINIC_OR_DEPARTMENT_OTHER): Payer: 59 | Admitting: Hematology & Oncology

## 2014-03-18 ENCOUNTER — Encounter: Payer: Self-pay | Admitting: Hematology & Oncology

## 2014-03-18 VITALS — BP 139/55 | HR 55 | Temp 97.5°F | Resp 20 | Ht 69.0 in | Wt 284.0 lb

## 2014-03-18 DIAGNOSIS — G4700 Insomnia, unspecified: Secondary | ICD-10-CM

## 2014-03-18 DIAGNOSIS — G4701 Insomnia due to medical condition: Secondary | ICD-10-CM

## 2014-03-18 DIAGNOSIS — D45 Polycythemia vera: Secondary | ICD-10-CM

## 2014-03-18 LAB — CMP (CANCER CENTER ONLY)
ALT(SGPT): 30 U/L (ref 10–47)
AST: 27 U/L (ref 11–38)
Albumin: 3.5 g/dL (ref 3.3–5.5)
Alkaline Phosphatase: 72 U/L (ref 26–84)
BUN, Bld: 18 mg/dL (ref 7–22)
CO2: 22 mEq/L (ref 18–33)
Calcium: 8.6 mg/dL (ref 8.0–10.3)
Chloride: 106 mEq/L (ref 98–108)
Creat: 1.2 mg/dl (ref 0.6–1.2)
Glucose, Bld: 143 mg/dL — ABNORMAL HIGH (ref 73–118)
Potassium: 3.7 mEq/L (ref 3.3–4.7)
Sodium: 138 mEq/L (ref 128–145)
Total Bilirubin: 0.8 mg/dl (ref 0.20–1.60)
Total Protein: 6.9 g/dL (ref 6.4–8.1)

## 2014-03-18 LAB — CBC WITH DIFFERENTIAL (CANCER CENTER ONLY)
BASO#: 0.2 10*3/uL (ref 0.0–0.2)
BASO%: 2.2 % — ABNORMAL HIGH (ref 0.0–2.0)
EOS%: 2.8 % (ref 0.0–7.0)
Eosinophils Absolute: 0.3 10*3/uL (ref 0.0–0.5)
HCT: 42.6 % (ref 38.7–49.9)
HGB: 13.3 g/dL (ref 13.0–17.1)
LYMPH#: 1.9 10*3/uL (ref 0.9–3.3)
LYMPH%: 19.9 % (ref 14.0–48.0)
MCH: 23.5 pg — ABNORMAL LOW (ref 28.0–33.4)
MCHC: 31.2 g/dL — ABNORMAL LOW (ref 32.0–35.9)
MCV: 75 fL — ABNORMAL LOW (ref 82–98)
MONO#: 1 10*3/uL — ABNORMAL HIGH (ref 0.1–0.9)
MONO%: 10.3 % (ref 0.0–13.0)
NEUT#: 6.3 10*3/uL (ref 1.5–6.5)
NEUT%: 64.8 % (ref 40.0–80.0)
Platelets: 616 10*3/uL — ABNORMAL HIGH (ref 145–400)
RBC: 5.66 10*6/uL (ref 4.20–5.70)
RDW: 17.1 % — ABNORMAL HIGH (ref 11.1–15.7)
WBC: 9.7 10*3/uL (ref 4.0–10.0)

## 2014-03-18 LAB — LACTATE DEHYDROGENASE: LDH: 208 U/L (ref 94–250)

## 2014-03-18 MED ORDER — TEMAZEPAM 22.5 MG PO CAPS: 22.5000 mg | ORAL_CAPSULE | Freq: Every evening | ORAL | Status: DC | PRN

## 2014-03-18 MED ORDER — ANAGRELIDE HCL 1 MG PO CAPS: ORAL_CAPSULE | ORAL | Status: DC

## 2014-03-18 NOTE — Progress Notes (Signed)
No phlebotomy today per Dr Marin Olp. HCT 42.6 today.

## 2014-03-19 NOTE — Progress Notes (Signed)
Hematology and Oncology Follow Up Visit  William Villanueva [id number] 08/19/1950 63 y.o. 03/19/2014   Principle Diagnosis:   Polycythemia vera-JAK2 positive  Current Therapy:    Anagrelide 1 mg by mouth daily alternating with 2 mg a day  Aspirin 81 mg by mouth daily  Phlebotomy to maintain hematocrit below 45%     Interim History:  William Villanueva is back for followup. He is feeling a little better. His back does not hurt as much.he still has the back pain. However, there is no issues with radiculopathy.  He is trying to exercise. He is walking a little bit more.  Apparently, he has try to find work. He certainly cannot do what he used to do.  I that he is sleeping a little better. He is on Restoril for this. He's had no problems with bleeding. He's had no fever. He's had no change in bowel or bladder habits.  There's been no issues with gout. Medications: Current outpatient prescriptions: amLODipine (NORVASC) 5 MG tablet, Take 5 mg by mouth at bedtime. , Disp: , Rfl: ;  anagrelide (AGRYLIN) 1 MG capsule, Take 2 capsules a day alternating with 1 capsule a day., Disp: 60 capsule, Rfl: 6;  aspirin 81 MG tablet, Take 81 mg by mouth daily., Disp: , Rfl: ;  COLCRYS 0.6 MG tablet, Take 0.6 mg by mouth as needed. , Disp: , Rfl:  docusate sodium (COLACE) 100 MG capsule, Take 100 mg by mouth as needed. , Disp: , Rfl: ;  Lactobacillus (PROBIATA PO), Take by mouth every morning., Disp: , Rfl: ;  omeprazole (PRILOSEC) 20 MG capsule, Take 20 mg by mouth daily., Disp: , Rfl: ;  oxymorphone (OPANA) 5 MG tablet, Take 5 mg by mouth every 4 (four) hours as needed for pain., Disp: , Rfl: ;  tamsulosin (FLOMAX) 0.4 MG CAPS, Take 0.4 mg by mouth as needed. , Disp: , Rfl:  temazepam (RESTORIL) 22.5 MG capsule, Take 1 capsule (22.5 mg total) by mouth at bedtime as needed for sleep., Disp: 30 capsule, Rfl: 2;  ULORIC 40 MG tablet, Take 40 mg by mouth daily. , Disp: , Rfl:  No current facility-administered  medications for this visit. Facility-Administered Medications Ordered in Other Visits: 0.9 %  sodium chloride infusion, , Intravenous, Continuous, Volanda Napoleon, MD, Stopped at 05/16/13 1115  Allergies:  Allergies  Allergen Reactions  . Wellbutrin [Bupropion] Hives    Past Medical History, Surgical history, Social history, and Family History were reviewed and updated.  Review of Systems: As above  Physical Exam:  height is 5' 9"  (1.753 m) and weight is 284 lb (128.822 kg). His oral temperature is 97.5 F (36.4 C). His blood pressure is 139/55 and his pulse is 55. His respiration is 20.   [name]. Lungs are clear. Cardiac exam regular in rhythm. He's an occasional extra beat. Abdomen is obese but soft. Has good bowel sounds. There is no fluid wave. There is a palpable liver or spleen tip. Back exam shows a well likely scar in the lumbar spine. This well-healed. Extremities shows no clubbing cyanosis or edema. He has good range of motion of his joints. He has good strength. Skin exam no rashes. Neurological exam is nonfocal.  Lab Results  Component Value Date   WBC 9.7 03/18/2014   HGB 13.3 03/18/2014   HCT 42.6 03/18/2014   MCV 75* 03/18/2014   PLT 616* 03/18/2014     Chemistry      Component Value Date/Time  NA 138 03/18/2014 0759   NA 139 01/25/2014 1400   K 3.7 03/18/2014 0759   K 4.4 01/25/2014 1400   CL 106 03/18/2014 0759   CL 103 01/25/2014 1400   CO2 22 03/18/2014 0759   CO2 21 01/25/2014 1400   BUN 18 03/18/2014 0759   BUN 20 01/25/2014 1400   CREATININE 1.2 03/18/2014 0759   CREATININE 1.70* 01/25/2014 1400      Component Value Date/Time   CALCIUM 8.6 03/18/2014 0759   CALCIUM 9.3 01/25/2014 1400   ALKPHOS 72 03/18/2014 0759   ALKPHOS 90 01/25/2014 1400   AST 27 03/18/2014 0759   AST 33 01/25/2014 1400   ALT 30 03/18/2014 0759   ALT 39 01/25/2014 1400   BILITOT 0.80 03/18/2014 0759   BILITOT 0.5 01/25/2014 1400         Impression and  Plan: [name] is 63 year old gentleman with polycythemia vera. He does need be phlebotomized today.  His platelet count is worse. I will adjust his anagrelide dose to alternate from 1 mg a day and 2 mg a. I will plan to get him back to see him in another 4-6 weeks. Volanda Napoleon, MD 11/12/20157:49 AM

## 2014-04-28 ENCOUNTER — Emergency Department (HOSPITAL_COMMUNITY): Payer: 59

## 2014-04-28 ENCOUNTER — Encounter (HOSPITAL_COMMUNITY): Payer: Self-pay | Admitting: Emergency Medicine

## 2014-04-28 ENCOUNTER — Inpatient Hospital Stay (HOSPITAL_COMMUNITY)
Admission: EM | Admit: 2014-04-28 | Discharge: 2014-04-30 | DRG: 243 | Disposition: A | Discharge status: Home / Self Care | Payer: 59 | Attending: Cardiology | Admitting: Cardiology

## 2014-04-28 DIAGNOSIS — Z950 Presence of cardiac pacemaker: Secondary | ICD-10-CM

## 2014-04-28 DIAGNOSIS — M109 Gout, unspecified: Secondary | ICD-10-CM | POA: Diagnosis present

## 2014-04-28 DIAGNOSIS — Z87891 Personal history of nicotine dependence: Secondary | ICD-10-CM | POA: Diagnosis not present

## 2014-04-28 DIAGNOSIS — R001 Bradycardia, unspecified: Secondary | ICD-10-CM | POA: Diagnosis present

## 2014-04-28 DIAGNOSIS — D45 Polycythemia vera: Secondary | ICD-10-CM | POA: Diagnosis present

## 2014-04-28 DIAGNOSIS — I5032 Chronic diastolic (congestive) heart failure: Secondary | ICD-10-CM | POA: Diagnosis present

## 2014-04-28 DIAGNOSIS — Z79899 Other long term (current) drug therapy: Secondary | ICD-10-CM

## 2014-04-28 DIAGNOSIS — I459 Conduction disorder, unspecified: Secondary | ICD-10-CM

## 2014-04-28 DIAGNOSIS — I428 Other cardiomyopathies: Secondary | ICD-10-CM | POA: Diagnosis present

## 2014-04-28 DIAGNOSIS — Z7982 Long term (current) use of aspirin: Secondary | ICD-10-CM | POA: Diagnosis not present

## 2014-04-28 DIAGNOSIS — R0602 Shortness of breath: Secondary | ICD-10-CM

## 2014-04-28 DIAGNOSIS — I2510 Atherosclerotic heart disease of native coronary artery without angina pectoris: Secondary | ICD-10-CM | POA: Diagnosis present

## 2014-04-28 DIAGNOSIS — Z8249 Family history of ischemic heart disease and other diseases of the circulatory system: Secondary | ICD-10-CM

## 2014-04-28 DIAGNOSIS — I959 Hypotension, unspecified: Secondary | ICD-10-CM | POA: Diagnosis not present

## 2014-04-28 DIAGNOSIS — I10 Essential (primary) hypertension: Secondary | ICD-10-CM

## 2014-04-28 DIAGNOSIS — I429 Cardiomyopathy, unspecified: Secondary | ICD-10-CM

## 2014-04-28 DIAGNOSIS — I5022 Chronic systolic (congestive) heart failure: Secondary | ICD-10-CM

## 2014-04-28 DIAGNOSIS — Z959 Presence of cardiac and vascular implant and graft, unspecified: Secondary | ICD-10-CM

## 2014-04-28 DIAGNOSIS — I441 Atrioventricular block, second degree: Secondary | ICD-10-CM | POA: Diagnosis present

## 2014-04-28 DIAGNOSIS — R0600 Dyspnea, unspecified: Secondary | ICD-10-CM

## 2014-04-28 HISTORY — DX: Hypospadias, unspecified: Q54.9

## 2014-04-28 LAB — CBC WITH DIFFERENTIAL/PLATELET
Basophils Absolute: 0.2 10*3/uL — ABNORMAL HIGH (ref 0.0–0.1)
Basophils Relative: 2 % — ABNORMAL HIGH (ref 0–1)
Eosinophils Absolute: 0.2 10*3/uL (ref 0.0–0.7)
Eosinophils Relative: 1 % (ref 0–5)
HCT: 42.4 % (ref 39.0–52.0)
Hemoglobin: 13.2 g/dL (ref 13.0–17.0)
Lymphocytes Relative: 14 % (ref 12–46)
Lymphs Abs: 1.7 10*3/uL (ref 0.7–4.0)
MCH: 23 pg — ABNORMAL LOW (ref 26.0–34.0)
MCHC: 31.1 g/dL (ref 30.0–36.0)
MCV: 73.7 fL — ABNORMAL LOW (ref 78.0–100.0)
Monocytes Absolute: 1 10*3/uL (ref 0.1–1.0)
Monocytes Relative: 8 % (ref 3–12)
Neutro Abs: 9.3 10*3/uL — ABNORMAL HIGH (ref 1.7–7.7)
Neutrophils Relative %: 75 % (ref 43–77)
Platelets: 544 10*3/uL — ABNORMAL HIGH (ref 150–400)
RBC: 5.75 MIL/uL (ref 4.22–5.81)
RDW: 16.9 % — ABNORMAL HIGH (ref 11.5–15.5)
WBC: 12.4 10*3/uL — ABNORMAL HIGH (ref 4.0–10.5)

## 2014-04-28 LAB — CBC
HCT: 41.3 % (ref 39.0–52.0)
Hemoglobin: 12.8 g/dL — ABNORMAL LOW (ref 13.0–17.0)
MCH: 22.8 pg — ABNORMAL LOW (ref 26.0–34.0)
MCHC: 31 g/dL (ref 30.0–36.0)
MCV: 73.6 fL — ABNORMAL LOW (ref 78.0–100.0)
Platelets: 570 10*3/uL — ABNORMAL HIGH (ref 150–400)
RBC: 5.61 MIL/uL (ref 4.22–5.81)
RDW: 16.8 % — ABNORMAL HIGH (ref 11.5–15.5)
WBC: 12.3 10*3/uL — ABNORMAL HIGH (ref 4.0–10.5)

## 2014-04-28 LAB — URINALYSIS, ROUTINE W REFLEX MICROSCOPIC
Bilirubin Urine: NEGATIVE
Glucose, UA: NEGATIVE mg/dL
Hgb urine dipstick: NEGATIVE
Ketones, ur: NEGATIVE mg/dL
Nitrite: NEGATIVE
Protein, ur: 100 mg/dL — AB
Specific Gravity, Urine: 1.02 (ref 1.005–1.030)
Urobilinogen, UA: 0.2 mg/dL (ref 0.0–1.0)
pH: 5.5 (ref 5.0–8.0)

## 2014-04-28 LAB — COMPREHENSIVE METABOLIC PANEL
ALT: 26 U/L (ref 0–53)
AST: 27 U/L (ref 0–37)
Albumin: 3.6 g/dL (ref 3.5–5.2)
Alkaline Phosphatase: 79 U/L (ref 39–117)
Anion gap: 10 (ref 5–15)
BUN: 15 mg/dL (ref 6–23)
CO2: 22 mmol/L (ref 19–32)
Calcium: 8.9 mg/dL (ref 8.4–10.5)
Chloride: 106 mEq/L (ref 96–112)
Creatinine, Ser: 1.33 mg/dL (ref 0.50–1.35)
GFR calc Af Amer: 65 mL/min — ABNORMAL LOW (ref >90)
GFR calc non Af Amer: 56 mL/min — ABNORMAL LOW (ref >90)
Glucose, Bld: 120 mg/dL — ABNORMAL HIGH (ref 70–99)
Potassium: 4.1 mmol/L (ref 3.5–5.1)
Sodium: 138 mmol/L (ref 135–145)
Total Bilirubin: 0.7 mg/dL (ref 0.3–1.2)
Total Protein: 7.1 g/dL (ref 6.0–8.3)

## 2014-04-28 LAB — TROPONIN I
Troponin I: 0.04 ng/mL — ABNORMAL HIGH (ref <0.031)
Troponin I: 0.05 ng/mL — ABNORMAL HIGH (ref <0.031)

## 2014-04-28 LAB — PROTIME-INR
INR: 1.22 (ref 0.00–1.49)
Prothrombin Time: 15.5 seconds — ABNORMAL HIGH (ref 11.6–15.2)

## 2014-04-28 LAB — D-DIMER, QUANTITATIVE (NOT AT ARMC): D-Dimer, Quant: 0.47 ug/mL-FEU (ref 0.00–0.48)

## 2014-04-28 LAB — URINE MICROSCOPIC-ADD ON

## 2014-04-28 LAB — CREATININE, SERUM
Creatinine, Ser: 1.61 mg/dL — ABNORMAL HIGH (ref 0.50–1.35)
GFR calc Af Amer: 51 mL/min — ABNORMAL LOW (ref >90)
GFR calc non Af Amer: 44 mL/min — ABNORMAL LOW (ref >90)

## 2014-04-28 LAB — TSH: TSH: 0.424 u[IU]/mL (ref 0.350–4.500)

## 2014-04-28 LAB — BRAIN NATRIURETIC PEPTIDE: B Natriuretic Peptide: 326.9 pg/mL — ABNORMAL HIGH (ref 0.0–100.0)

## 2014-04-28 LAB — MRSA PCR SCREENING: MRSA by PCR: NEGATIVE

## 2014-04-28 MED ORDER — ONDANSETRON HCL 4 MG/2ML IJ SOLN: 4.0000 mg | Freq: Four times a day (QID) | INTRAMUSCULAR | Status: DC | PRN

## 2014-04-28 MED ORDER — ASPIRIN 81 MG PO TABS: 81.0000 mg | ORAL_TABLET | Freq: Every day | ORAL | Status: DC

## 2014-04-28 MED ORDER — SODIUM CHLORIDE 0.9 % IJ SOLN: 3.0000 mL | INTRAMUSCULAR | Status: DC | PRN

## 2014-04-28 MED ORDER — ANAGRELIDE HCL 1 MG PO CAPS
1.0000 mg | ORAL_CAPSULE | Freq: Every day | ORAL | Status: DC
Start: 2014-04-28 — End: 2014-04-28

## 2014-04-28 MED ORDER — MORPHINE SULFATE 15 MG PO TABS
15.0000 mg | ORAL_TABLET | ORAL | Status: DC | PRN
  Administered 2014-04-29 – 2014-04-30 (×5): 15 mg via ORAL
  Filled 2014-04-28 (×5): qty 1

## 2014-04-28 MED ORDER — CHLORHEXIDINE GLUCONATE 4 % EX LIQD
60.0000 mL | Freq: Once | CUTANEOUS | Status: AC
  Administered 2014-04-28: 4 via TOPICAL
  Filled 2014-04-28: qty 60

## 2014-04-28 MED ORDER — TEMAZEPAM 7.5 MG PO CAPS: 22.5000 mg | ORAL_CAPSULE | Freq: Every evening | ORAL | Status: DC | PRN

## 2014-04-28 MED ORDER — GENTAMICIN SULFATE 40 MG/ML IJ SOLN
80.0000 mg | INTRAMUSCULAR | Status: DC
  Filled 2014-04-28: qty 2

## 2014-04-28 MED ORDER — DOCUSATE SODIUM 100 MG PO CAPS
100.0000 mg | ORAL_CAPSULE | ORAL | Status: DC | PRN
  Filled 2014-04-28: qty 1

## 2014-04-28 MED ORDER — CHLORHEXIDINE GLUCONATE 4 % EX LIQD
60.0000 mL | Freq: Once | CUTANEOUS | Status: AC
  Administered 2014-04-29: 4 via TOPICAL
  Filled 2014-04-28: qty 60

## 2014-04-28 MED ORDER — NITROGLYCERIN 0.4 MG SL SUBL: 0.4000 mg | SUBLINGUAL_TABLET | SUBLINGUAL | Status: DC | PRN

## 2014-04-28 MED ORDER — ANAGRELIDE HCL 0.5 MG PO CAPS
1.0000 mg | ORAL_CAPSULE | ORAL | Status: DC
  Administered 2014-04-29: 1 mg via ORAL
  Filled 2014-04-28: qty 2

## 2014-04-28 MED ORDER — PANTOPRAZOLE SODIUM 40 MG PO TBEC
40.0000 mg | DELAYED_RELEASE_TABLET | Freq: Every day | ORAL | Status: DC
  Administered 2014-04-28 – 2014-04-30 (×3): 40 mg via ORAL
  Filled 2014-04-28 (×3): qty 1

## 2014-04-28 MED ORDER — FEBUXOSTAT 40 MG PO TABS
40.0000 mg | ORAL_TABLET | Freq: Every day | ORAL | Status: DC
  Administered 2014-04-28 – 2014-04-30 (×3): 40 mg via ORAL
  Filled 2014-04-28 (×3): qty 1

## 2014-04-28 MED ORDER — TAMSULOSIN HCL 0.4 MG PO CAPS
0.4000 mg | ORAL_CAPSULE | Freq: Every day | ORAL | Status: DC
  Administered 2014-04-28 – 2014-04-30 (×3): 0.4 mg via ORAL
  Filled 2014-04-28 (×3): qty 1

## 2014-04-28 MED ORDER — ASPIRIN EC 81 MG PO TBEC
81.0000 mg | DELAYED_RELEASE_TABLET | Freq: Every day | ORAL | Status: DC
  Administered 2014-04-28 – 2014-04-30 (×2): 81 mg via ORAL
  Filled 2014-04-28 (×3): qty 1

## 2014-04-28 MED ORDER — SODIUM CHLORIDE 0.9 % IV SOLN: 250.0000 mL | INTRAVENOUS | Status: DC | PRN

## 2014-04-28 MED ORDER — ANAGRELIDE HCL 0.5 MG PO CAPS
2.0000 mg | ORAL_CAPSULE | ORAL | Status: DC
  Administered 2014-04-30: 2 mg via ORAL
  Filled 2014-04-28: qty 4

## 2014-04-28 MED ORDER — CEFAZOLIN SODIUM 10 G IJ SOLR
3.0000 g | INTRAMUSCULAR | Status: AC
Start: 2014-04-29 — End: 2014-04-29
  Administered 2014-04-29: 3 g via INTRAVENOUS
  Filled 2014-04-28: qty 3000

## 2014-04-28 MED ORDER — SODIUM CHLORIDE 0.9 % IJ SOLN
3.0000 mL | Freq: Two times a day (BID) | INTRAMUSCULAR | Status: DC
  Administered 2014-04-28 – 2014-04-30 (×4): 3 mL via INTRAVENOUS

## 2014-04-28 MED ORDER — HEPARIN SODIUM (PORCINE) 5000 UNIT/ML IJ SOLN: 5000.0000 [IU] | Freq: Three times a day (TID) | INTRAMUSCULAR | Status: DC

## 2014-04-28 MED ORDER — ACETAMINOPHEN 325 MG PO TABS
650.0000 mg | ORAL_TABLET | ORAL | Status: DC | PRN
  Administered 2014-04-28: 650 mg via ORAL
  Filled 2014-04-28: qty 2

## 2014-04-28 MED ORDER — SODIUM CHLORIDE 0.9 % IV SOLN
INTRAVENOUS | Status: DC
  Administered 2014-04-29: 50 mL/h via INTRAVENOUS

## 2014-04-28 NOTE — ED Notes (Signed)
Patient is unable to give a urine specimen at this time  

## 2014-04-28 NOTE — ED Notes (Signed)
Pt sent here for evaluation of increased SOB; pt noted some bradycardia; pt denies CP or swelling in legs

## 2014-04-28 NOTE — Consult Note (Addendum)
ELECTROPHYSIOLOGY CONSULT NOTE    Patient ID: Lymon Kidney MRN: [id number], DOB/AGE: [birth year] 63 y.o.  Admit date: 04/28/2014 Date of Consult: 04/28/2014  Primary Physician: Thurman Coyer, MD Primary Cardiologist: Stanford Breed  Reason for Consultation: heart block  HPI:  William Villanueva is a 63 y.o. male with a past medical history significant for hypertension, non-ischemic cardiomyopathy, DDD, and polycythemia vera.  He has been followed by Dr Stanford Breed and Dr Marin Olp.  He states that he has chronic shortness of breath on exertion dating back at least 6-7 months.  His activity is mostly limited by shortness of breath and back pain.  This morning, he awoke at 2:30AM with shortness of breath.  This persisted and he went to his PCP's office for evaluation.  He was then referred to [facility] due to bradycardia.  EKG demonstrated sinus rhythm with high grade heart block and PVC's with ventricular rate in the 50's.  EP has been asked to evaluate for treatment options.   He denies recent chest pain, LE edema, nausea, vomiting, diarrhea, fevers or chills. He states that he has been going to [employer] recently to try to improve exercise tolerance.  While walking there on the treadmill, HR does not go above 60bpm on their monitor.    Last cath 01-15-2012 demonstrated mild, non-osbtructive CAD with no hemodynamic evidence of restriction.  Last echo 06/2012 demonstrated EF 45%, mild LVH, calcified mitral valve annulus, LA 43.  Lab work this admission is notable for WBC 12.4, platelets 544, normal electrolytes. He is not on any AV nodal blocking agents.   ROS is negative except as outlined above.   Past Medical History  Diagnosis Date  . Polycythemia vera(238.4)   . Back pain   . Hemochromatosis   . Gout   . Hypertension   . Nephrolithiasis   . Cardiomyopathy     Nonischemic 45%.      Surgical History:  Past Surgical History  Procedure Laterality Date  . Ankle surgery    . Pilonidal  cyst excision    . Tonsillectomy       Prior to Admission medications   Medication Sig Start Date End Date Taking? Authorizing Provider  amLODipine (NORVASC) 5 MG tablet Take 5 mg by mouth at bedtime.  05/13/13  Yes Historical Provider, MD  anagrelide (AGRYLIN) 1 MG capsule Take 2 capsules a day alternating with 1 capsule a day. 03/18/14  Yes Volanda Napoleon, MD  aspirin 81 MG tablet Take 81 mg by mouth daily.   Yes Historical Provider, MD  COLCRYS 0.6 MG tablet Take 0.6 mg by mouth as needed.  03/26/13  Yes Historical Provider, MD  docusate sodium (COLACE) 100 MG capsule Take 100 mg by mouth as needed.    Yes Historical Provider, MD  Lactobacillus (PROBIATA PO) Take by mouth every morning.   Yes Historical Provider, MD  omeprazole (PRILOSEC) 20 MG capsule Take 20 mg by mouth daily.   Yes Historical Provider, MD  oxymorphone (OPANA) 5 MG tablet Take 5 mg by mouth every 4 (four) hours as needed for pain.   Yes Historical Provider, MD  tamsulosin (FLOMAX) 0.4 MG CAPS Take 0.4 mg by mouth as needed.    Yes Historical Provider, MD  temazepam (RESTORIL) 22.5 MG capsule Take 1 capsule (22.5 mg total) by mouth at bedtime as needed for sleep. 03/18/14  Yes Volanda Napoleon, MD  ULORIC 40 MG tablet Take 40 mg by mouth daily.  04/22/13  Yes Historical Provider, MD  Inpatient Medications:   Allergies:  Allergies  Allergen Reactions  . Prednisone Other (See Comments)    Abdominal pain  . Wellbutrin [Bupropion] Hives    History   Social History  . Marital Status: Married    Spouse Name: N/A    Number of Children: N/A  . Years of Education: N/A   Occupational History  . Not on file.   Social History Main Topics  . Smoking status: Former Smoker -- 1.00 packs/day for 44 years    Types: Cigarettes    Start date: 06/05/1968    Quit date: 04/05/2013  . Smokeless tobacco: Never Used     Comment: quit October 2014  . Alcohol Use: Yes     Comment: Occasional  . Drug Use: No  . Sexual  Activity: Not on file   Other Topics Concern  . Not on file   Social History Narrative   Lives at home with wife.      Family History  Problem Relation Age of Onset  . Heart disease Maternal Grandmother     Pacemaker, MI    BP 152/57 mmHg  Pulse 32  Temp(Src) 97.4 F (36.3 C) (Oral)  Resp 17  Ht 5' 10"  (1.778 m)  Wt 286 lb (129.729 kg)  BMI 41.04 kg/m2  SpO2 95% Physical Exam: Filed Vitals:   04/28/14 1345 04/28/14 1400 04/28/14 1415 04/28/14 1430  BP: 130/42 152/42 163/69 152/57  Pulse: 48 47 41 32  Temp:      TempSrc:      Resp: 19 14 14 17   Height:      Weight:      SpO2: 97% 97% 97% 95%    GEN- The patient is overweight appearing, alert and oriented x 3 today.   Head- normocephalic, atraumatic Eyes-  Sclera clear, conjunctiva pink Ears- hearing intact Oropharynx- clear Neck- supple, Lungs- Clear to ausculation bilaterally, normal work of breathing Heart- bradycardic regular rate and rhythm with ectopy, no murmurs, rubs or gallops, PMI not laterally displaced GI- soft, NT, ND, + BS Extremities- no clubbing, cyanosis, or edema  MS- no significant deformity or atrophy Skin- no rash or lesion Psych- euthymic mood, full affect Neuro- strength and sensation are intact    Labs:   Lab Results  Component Value Date   WBC 12.4* 04/28/2014   HGB 13.2 04/28/2014   HCT 42.4 04/28/2014   MCV 73.7* 04/28/2014   PLT 544* 04/28/2014    Recent Labs Lab 04/28/14 1026  NA 138  K 4.1  CL 106  CO2 22  BUN 15  CREATININE 1.33  CALCIUM 8.9  PROT 7.1  BILITOT 0.7  ALKPHOS 79  ALT 26  AST 27  GLUCOSE 120*     Radiology/Studies: Dg Chest 2 View 04/28/2014   CLINICAL DATA:  Bradycardia and shortness of breath for several days  EXAM: CHEST  2 VIEW  COMPARISON:  April 18, 2013  FINDINGS: There is mild scarring in the left lung base. There is no edema or consolidation. Heart size and pulmonary vascularity are normal. No adenopathy. There is degenerative change  in the thoracic spine. There is evidence of old rib trauma on the left.  IMPRESSION: Slight scarring left base.  No edema or consolidation.   Electronically Signed   By: Lowella Grip M.D.   On: 04/28/2014 11:40    EKG: reveals sinus tachycardia with mobitz II second degree AV block, transient complete heart block is observed with a wide QRS escape  Old ekgs  are reviewed and reveals baseline incomplete RBBB/ LAHB suggestive of chronic conduction system disease  TELEMETRY: sinus rhythm with predominantly complete heart block and occasional mobitz II second degree AV block, ventricular rate 50's, PVC's  Epic records including Dr Lonia Skinner notes are reviewed.   A/P  1. 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 He has a h/o conduction system disease by prior ekgs.  He has not taken metoprolol for several months and ideally should be started back on either metoprolol or coreg for appropriate management of his CHF long term.   I have spoken with both Dr Stanford Breed and Dr Percival Spanish about this patient. We are all in agreement that pacing is appropriate for him.   I agree with Dr Percival Spanish that ischemic is not the cause for this patient's AV block and decompensation.  Given reduced EF, I would anticipate CRT therapy as he will V pace all of the time.  An echo has been ordered.  If EF >35%, I would anticipate a CRT-P device.  If his EF is <35% then we will have to make a decision about whether or not to offer an ICD.  He was previously treated with ace inhibitor and beta blocker however these were apparently stopped several months ago due to hypotension.  Ideally, he should be placed on coreg and an ace inhibitor prior to discharge.  We will watch the patient closely on telemetry as he is at high risk for further decompensation.  A high level of decision making was required for this consult today. Risks, benefits, alternatives to both CRT-P and  CRT-D implantation were discussed in detail with the patient today. Dr Caryl Comes will see the patient in the AM and I anticipate device implant tomorrow.  2. Chronic systolic dysfunction/ nonischemic CM As above Cardiac MRI from 2013 is reviewed  3. Hypertension Stable No change required today

## 2014-04-28 NOTE — ED Provider Notes (Signed)
CSN: [id number]     Arrival date & time 04/28/14  0957 History   First MD Initiated Contact with Patient 04/28/14 1014     Chief Complaint  Patient presents with  . Shortness of Breath  . Bradycardia     (Consider location/radiation/quality/duration/timing/severity/associated sxs/prior Treatment) HPI Patient presents to the emergency department with shortness of breath that started last night.  The patient states he somewhat short of breath all the time, but states that last night he had trouble taking a good deep breath and felt like he was more short of breath than normal.  The patient states that he went to his primary care doctor today and they were unable to do a complete workup, so they sent him to the emergency department.  Patient states that he has not had any chest pain, nausea, vomiting, diaphoresis, weakness, headache, blurred vision, lightheadedness, polyuria, polydipsia, cough, fever, runny nose, sore throat, rash, near syncope, abdominal pain, or syncope.  Patient states nothing seems make his condition, better or worse.  Patient did not take any medications prior to arrival Past Medical History  Diagnosis Date  . Polycythemia vera(238.4)   . Back pain   . Hemochromatosis   . Gout   . Hypertension   . Nephrolithiasis   . Cardiomyopathy    Past Surgical History  Procedure Laterality Date  . Ankle surgery    . Pilonidal cyst excision    . Tonsillectomy     Family History  Problem Relation Age of Onset  . Heart disease      No family history   History  Substance Use Topics  . Smoking status: Former Smoker -- 1.00 packs/day for 44 years    Types: Cigarettes    Start date: 06/05/1968    Quit date: 04/05/2013  . Smokeless tobacco: Never Used     Comment: quit October 2014  . Alcohol Use: Yes     Comment: Occasional    Review of Systems  All other systems negative except as documented in the HPI. All pertinent positives and negatives as reviewed in the  HPI.  Allergies  Wellbutrin  Home Medications   Prior to Admission medications   Medication Sig Start Date End Date Taking? Authorizing Provider  amLODipine (NORVASC) 5 MG tablet Take 5 mg by mouth at bedtime.  05/13/13   Historical Provider, MD  anagrelide (AGRYLIN) 1 MG capsule Take 2 capsules a day alternating with 1 capsule a day. 03/18/14   Volanda Napoleon, MD  aspirin 81 MG tablet Take 81 mg by mouth daily.    Historical Provider, MD  COLCRYS 0.6 MG tablet Take 0.6 mg by mouth as needed.  03/26/13   Historical Provider, MD  docusate sodium (COLACE) 100 MG capsule Take 100 mg by mouth as needed.     Historical Provider, MD  Lactobacillus (PROBIATA PO) Take by mouth every morning.    Historical Provider, MD  omeprazole (PRILOSEC) 20 MG capsule Take 20 mg by mouth daily.    Historical Provider, MD  oxymorphone (OPANA) 5 MG tablet Take 5 mg by mouth every 4 (four) hours as needed for pain.    Historical Provider, MD  tamsulosin (FLOMAX) 0.4 MG CAPS Take 0.4 mg by mouth as needed.     Historical Provider, MD  temazepam (RESTORIL) 22.5 MG capsule Take 1 capsule (22.5 mg total) by mouth at bedtime as needed for sleep. 03/18/14   Volanda Napoleon, MD  ULORIC 40 MG tablet Take 40 mg by mouth  daily.  04/22/13   Historical Provider, MD   BP 143/48 mmHg  Pulse 43  Temp(Src) 97.4 F (36.3 C) (Oral)  Resp 21  Ht 5\' 10"  (1.778 m)  Wt 286 lb (129.729 kg)  BMI 41.04 kg/m2  SpO2 96% Physical Exam  Constitutional: He is oriented to person, place, and time. He appears well-developed and well-nourished. No distress.  HENT:  Head: Normocephalic and atraumatic.  Mouth/Throat: Oropharynx is clear and moist.  Eyes: Pupils are equal, round, and reactive to light.  Neck: Normal range of motion. Neck supple.  Cardiovascular: Normal rate, regular rhythm and normal heart sounds.  Exam reveals no gallop and no friction rub.   No murmur heard. Pulmonary/Chest: Effort normal and breath sounds normal. No  respiratory distress. He has no wheezes. He has no rhonchi. He has no rales.  Abdominal: Soft. Bowel sounds are normal. He exhibits no distension. There is no tenderness.  Musculoskeletal: He exhibits no edema.  Neurological: He is alert and oriented to person, place, and time. He exhibits normal muscle tone. Coordination normal.  Skin: Skin is warm and dry. No rash noted. No erythema.  Psychiatric: He has a normal mood and affect. His behavior is normal.  Nursing note and vitals reviewed.   ED Course  Procedures (including critical care time) Labs Review Labs Reviewed  COMPREHENSIVE METABOLIC PANEL - Abnormal; Notable for the following:    Glucose, Bld 120 (*)    GFR calc non Af Amer 56 (*)    GFR calc Af Amer 65 (*)    All other components within normal limits  CBC WITH DIFFERENTIAL - Abnormal; Notable for the following:    WBC 12.4 (*)    MCV 73.7 (*)    MCH 23.0 (*)    RDW 16.9 (*)    Platelets 544 (*)    Neutro Abs 9.3 (*)    Basophils Relative 2 (*)    Basophils Absolute 0.2 (*)    All other components within normal limits  TROPONIN I - Abnormal; Notable for the following:    Troponin I 0.04 (*)    All other components within normal limits  URINALYSIS, ROUTINE W REFLEX MICROSCOPIC - Abnormal; Notable for the following:    APPearance CLOUDY (*)    Protein, ur 100 (*)    Leukocytes, UA TRACE (*)    All other components within normal limits  BRAIN NATRIURETIC PEPTIDE - Abnormal; Notable for the following:    B Natriuretic Peptide 326.9 (*)    All other components within normal limits  D-DIMER, QUANTITATIVE  URINE MICROSCOPIC-ADD ON    Imaging Review No results found.   Date: 04/28/2014  Rate: 62  Rhythm: normal sinus rhythm and premature ventricular contractions (PVC)  QRS Axis: normal  Intervals: QT prolonged  ST/T Wave abnormalities: normal  Conduction Disutrbances:left anterior fascicular block  Narrative Interpretation:   Old EKG Reviewed:  unchanged    MDM   Final diagnoses:  SOB (shortness of breath)   I spoke with cardiology, who will be down to see the patient for admission   Brent General, PA-C 04/28/14 [place], MD 05/04/14 1130

## 2014-04-28 NOTE — ED Notes (Signed)
Ordered [facility] [employer] Tray for patient.

## 2014-04-28 NOTE — ED Notes (Signed)
Contacted Service Response regarding delay in Meal Tray delivery. Pt updated.

## 2014-04-28 NOTE — Consult Note (Signed)
CARDIOLOGY CONSULT NOTE  Patient ID: William Villanueva MRN: [id number] DOB/AGE: 1950-12-26 63 y.o.  Admit date: 04/28/2014 Primary Physician Thurman Coyer, MD  Primary Cardiologist  Dr. Stanford Breed Chief Complaint  Dsypnea  HPI:  The patient has a history of cardiomyopathy with an EF of 35 - 40%.  This is nonischemic.  He had cath in 2013 with minimal plaque.  He is limited by back pain but can go to the gym and walk on the treadmill.  He woke up this morning and noticed that he was acutely SOB.  She denies any chest pain.  He has had no neck or arm pain.  He went to his PCP and was thought to have some heart failure.  He also had an abnormal EKG.  He was referred to the ED.  In the ED troponin was 0.04 and BNP was 326.9.  EKG shows intermittent 2:1 HB.  There is intermittent wide complex escape and fusion complexes.  He has not had any chest pain, neck or arm pain.  He had PND this AM but prior to this no PND or orthopnea.  He does have chronic dyspnea.  He has ahd no swelling or weight gain.  He did have his lisinopril stopped recently secondary to low BP.  He has been very stressed secondary to his back problems with surgery earlier this year and not being able to work.    Past Medical History  Diagnosis Date  . Polycythemia vera(238.4)   . Back pain   . Hemochromatosis   . Gout   . Hypertension   . Nephrolithiasis   . Cardiomyopathy     Past Surgical History  Procedure Laterality Date  . Ankle surgery    . Pilonidal cyst excision    . Tonsillectomy      Allergies  Allergen Reactions  . Prednisone Other (See Comments)    Abdominal pain  . Wellbutrin [Bupropion] Hives   Prior to Admission medications   Medication Sig Start Date End Date Taking? Authorizing Provider  amLODipine (NORVASC) 5 MG tablet Take 5 mg by mouth at bedtime.  05/13/13  Yes Historical Provider, MD  anagrelide (AGRYLIN) 1 MG capsule Take 2 capsules a day alternating with 1 capsule a day. 03/18/14  Yes Volanda Napoleon, MD  aspirin 81 MG tablet Take 81 mg by mouth daily.   Yes Historical Provider, MD  COLCRYS 0.6 MG tablet Take 0.6 mg by mouth as needed.  03/26/13  Yes Historical Provider, MD  docusate sodium (COLACE) 100 MG capsule Take 100 mg by mouth as needed.    Yes Historical Provider, MD  Lactobacillus (PROBIATA PO) Take by mouth every morning.   Yes Historical Provider, MD  omeprazole (PRILOSEC) 20 MG capsule Take 20 mg by mouth daily.   Yes Historical Provider, MD  oxymorphone (OPANA) 5 MG tablet Take 5 mg by mouth every 4 (four) hours as needed for pain.   Yes Historical Provider, MD  tamsulosin (FLOMAX) 0.4 MG CAPS Take 0.4 mg by mouth as needed.    Yes Historical Provider, MD  temazepam (RESTORIL) 22.5 MG capsule Take 1 capsule (22.5 mg total) by mouth at bedtime as needed for sleep. 03/18/14  Yes Volanda Napoleon, MD  ULORIC 40 MG tablet Take 40 mg by mouth daily.  04/22/13  Yes Historical Provider, MD     (Not in a hospital admission) Family History  Problem Relation Age of Onset  . Heart disease      No family  history    History   Social History  . Marital Status: Married    Spouse Name: N/A    Number of Children: N/A  . Years of Education: N/A   Occupational History  . Not on file.   Social History Main Topics  . Smoking status: Former Smoker -- 1.00 packs/day for 44 years    Types: Cigarettes    Start date: 06/05/1968    Quit date: 04/05/2013  . Smokeless tobacco: Never Used     Comment: quit October 2014  . Alcohol Use: Yes     Comment: Occasional  . Drug Use: No  . Sexual Activity: Not on file   Other Topics Concern  . Not on file   Social History Narrative     ROS:  Diarrhea x 3 months.    As stated in the HPI and negative for all other systems.  Physical Exam: Blood pressure 137/51, pulse 38, temperature 97.4 F (36.3 C), temperature source Oral, resp. rate 16, height 5' 10"  (1.778 m), weight 286 lb (129.729 kg), SpO2 94 %.  GENERAL:  Well  appearing HEENT:  Pupils equal round and reactive, fundi not visualized, oral mucosa unremarkable NECK:  No jugular venous distention, waveform within normal limits, carotid upstroke brisk and symmetric, no bruits, no thyromegaly LYMPHATICS:  No cervical, inguinal adenopathy LUNGS:  Decreased breath sounds with basilar crackles.   BACK:  No CVA tenderness CHEST:  Unremarkable HEART:  PMI not displaced or sustained,S1 and S2 within normal limits, no S3, no S4, no clicks, no rubs, 2/6 apical systolic murmur, no diastolic murmurs ABD:  Flat, positive bowel sounds normal in frequency in pitch, no bruits, no rebound, no guarding, no midline pulsatile mass, no hepatomegaly, no splenomegaly EXT:  2 plus pulses throughout, no edema, no cyanosis no clubbing SKIN:  No rashes no nodules NEURO:  Cranial nerves II through XII grossly intact, motor grossly intact throughout PSYCH:  Cognitively intact, oriented to person place and time   Labs: Lab Results  Component Value Date   BUN 15 04/28/2014   Lab Results  Component Value Date   CREATININE 1.33 04/28/2014   Lab Results  Component Value Date   NA 138 04/28/2014   K 4.1 04/28/2014   CL 106 04/28/2014   CO2 22 04/28/2014   Lab Results  Component Value Date   TROPONINI 0.04* 04/28/2014   Lab Results  Component Value Date   WBC 12.4* 04/28/2014   HGB 13.2 04/28/2014   HCT 42.4 04/28/2014   MCV 73.7* 04/28/2014   PLT 544* 04/28/2014   No results found for: CHOL, HDL, LDLCALC, LDLDIRECT, TRIG, CHOLHDL Lab Results  Component Value Date   ALT 26 04/28/2014   AST 27 04/28/2014   ALKPHOS 79 04/28/2014   BILITOT 0.7 04/28/2014     Radiology:   CXR: There is mild scarring in the left lung base. There is no edema or consolidation. Heart size and pulmonary vascularity are normal. No adenopathy. There is degenerative change in the thoracic spine. There is evidence of old rib trauma on the left.  EKG:  Sinus rhythm with heart block with  intermittent conduction with fusion complexes and wide complex escape.  No acute ST T wave changes  ASSESSMENT AND PLAN:   DYSPNEA:  I suspect that this is related to the heart block as it is abrupt onset.  I do not strongly suspect acute HF as the primary cause although he does have resultant edema.   I will  order cardiac enzymes.  We will order an echo for today.  We can give him a dose of IV diuresis in the ED  HEART BLOCK:  Discussed with EP.  They will consult.  CARDIOMYOPATHY:  This was felt to be nonischemic.  He has had an MRI which did not suggest an infiltrative process.    Plans as above.  Consider restarting ACE prior to discharge.    SignedMinus Breeding 04/28/2014, 1:40 PM

## 2014-04-28 NOTE — Progress Notes (Signed)
ED CM called to meet with patient concerning insurance and financial needs.  Met with patient at bedside, patient reports he will be losing his insurance at the end of next month, and losing his income from his Workers Comp case.  Resources provided for the [place], explained that patient can apply for Medicaid and other Aid once he does not have any income or insurance.  Patient verbalized understanding.  No further CM needs identified.

## 2014-04-28 NOTE — ED Notes (Signed)
Case management called for patient assistance. William Villanueva will bring paperwork for DSS assistance.

## 2014-04-28 NOTE — ED Notes (Signed)
Cardiology at bedside.

## 2014-04-29 ENCOUNTER — Encounter (HOSPITAL_COMMUNITY): Payer: Self-pay | Admitting: *Deleted

## 2014-04-29 ENCOUNTER — Encounter (HOSPITAL_COMMUNITY)
Admission: EM | Disposition: A | Discharge status: Home / Self Care | Payer: Self-pay | Source: Home / Self Care | Attending: Cardiology

## 2014-04-29 ENCOUNTER — Inpatient Hospital Stay (HOSPITAL_COMMUNITY): Payer: 59 | Admitting: Anesthesiology

## 2014-04-29 DIAGNOSIS — I517 Cardiomegaly: Secondary | ICD-10-CM

## 2014-04-29 DIAGNOSIS — I441 Atrioventricular block, second degree: Secondary | ICD-10-CM | POA: Diagnosis present

## 2014-04-29 DIAGNOSIS — I428 Other cardiomyopathies: Secondary | ICD-10-CM

## 2014-04-29 DIAGNOSIS — I509 Heart failure, unspecified: Secondary | ICD-10-CM

## 2014-04-29 DIAGNOSIS — I442 Atrioventricular block, complete: Secondary | ICD-10-CM

## 2014-04-29 DIAGNOSIS — M79609 Pain in unspecified limb: Secondary | ICD-10-CM

## 2014-04-29 HISTORY — PX: BI-VENTRICULAR PACEMAKER INSERTION: SHX5462

## 2014-04-29 LAB — LIPID PANEL
Cholesterol: 126 mg/dL (ref 0–200)
HDL: 29 mg/dL — ABNORMAL LOW (ref >39)
LDL Cholesterol: 68 mg/dL (ref 0–99)
Total CHOL/HDL Ratio: 4.3 RATIO
Triglycerides: 146 mg/dL (ref <150)
VLDL: 29 mg/dL (ref 0–40)

## 2014-04-29 LAB — HEMOGLOBIN A1C
Hgb A1c MFr Bld: 6.3 % — ABNORMAL HIGH (ref <5.7)
Mean Plasma Glucose: 134 mg/dL — ABNORMAL HIGH (ref <117)

## 2014-04-29 LAB — BASIC METABOLIC PANEL
Anion gap: 8 (ref 5–15)
BUN: 13 mg/dL (ref 6–23)
CO2: 25 mmol/L (ref 19–32)
Calcium: 8.8 mg/dL (ref 8.4–10.5)
Chloride: 104 mEq/L (ref 96–112)
Creatinine, Ser: 1.39 mg/dL — ABNORMAL HIGH (ref 0.50–1.35)
GFR calc Af Amer: 61 mL/min — ABNORMAL LOW (ref >90)
GFR calc non Af Amer: 53 mL/min — ABNORMAL LOW (ref >90)
Glucose, Bld: 116 mg/dL — ABNORMAL HIGH (ref 70–99)
Potassium: 4.2 mmol/L (ref 3.5–5.1)
Sodium: 137 mmol/L (ref 135–145)

## 2014-04-29 LAB — CARBOXYHEMOGLOBIN
Carboxyhemoglobin: 2 % — ABNORMAL HIGH (ref 0.5–1.5)
Methemoglobin: 0.6 % (ref 0.0–1.5)
O2 Saturation: 65.4 %
Total hemoglobin: 11.1 g/dL — ABNORMAL LOW (ref 13.5–18.0)

## 2014-04-29 LAB — TROPONIN I
Troponin I: 0.05 ng/mL — ABNORMAL HIGH (ref <0.031)
Troponin I: 0.05 ng/mL — ABNORMAL HIGH (ref <0.031)

## 2014-04-29 LAB — PROTIME-INR
INR: 1.18 (ref 0.00–1.49)
Prothrombin Time: 15.2 seconds (ref 11.6–15.2)

## 2014-04-29 LAB — CBC
HCT: 43.4 % (ref 39.0–52.0)
Hemoglobin: 13.3 g/dL (ref 13.0–17.0)
MCH: 23.1 pg — ABNORMAL LOW (ref 26.0–34.0)
MCHC: 30.6 g/dL (ref 30.0–36.0)
MCV: 75.5 fL — ABNORMAL LOW (ref 78.0–100.0)
Platelets: 559 10*3/uL — ABNORMAL HIGH (ref 150–400)
RBC: 5.75 MIL/uL (ref 4.22–5.81)
RDW: 16.9 % — ABNORMAL HIGH (ref 11.5–15.5)
WBC: 11.4 10*3/uL — ABNORMAL HIGH (ref 4.0–10.5)

## 2014-04-29 SURGERY — BI-VENTRICULAR PACEMAKER INSERTION (CRT-P)
Anesthesia: General

## 2014-04-29 MED ORDER — SUCCINYLCHOLINE CHLORIDE 20 MG/ML IJ SOLN
INTRAMUSCULAR | Status: DC | PRN
  Administered 2014-04-29: 110 mg via INTRAVENOUS

## 2014-04-29 MED ORDER — CARVEDILOL 3.125 MG PO TABS
3.1250 mg | ORAL_TABLET | Freq: Two times a day (BID) | ORAL | Status: DC
  Administered 2014-04-29 – 2014-04-30 (×2): 3.125 mg via ORAL
  Filled 2014-04-29 (×4): qty 1

## 2014-04-29 MED ORDER — ONDANSETRON HCL 4 MG/2ML IJ SOLN: 4.0000 mg | Freq: Four times a day (QID) | INTRAMUSCULAR | Status: DC | PRN

## 2014-04-29 MED ORDER — LIDOCAINE HCL (CARDIAC) 20 MG/ML IV SOLN
INTRAVENOUS | Status: DC | PRN
  Administered 2014-04-29: 70 mg via INTRAVENOUS

## 2014-04-29 MED ORDER — FENTANYL CITRATE 0.05 MG/ML IJ SOLN
INTRAMUSCULAR | Status: DC | PRN
  Administered 2014-04-29 (×2): 100 ug via INTRAVENOUS

## 2014-04-29 MED ORDER — LIDOCAINE HCL (PF) 1 % IJ SOLN
INTRAMUSCULAR | Status: AC
  Filled 2014-04-29: qty 60

## 2014-04-29 MED ORDER — MIDAZOLAM HCL 5 MG/5ML IJ SOLN
INTRAMUSCULAR | Status: DC | PRN
  Administered 2014-04-29: 2 mg via INTRAVENOUS

## 2014-04-29 MED ORDER — ONDANSETRON HCL 4 MG/2ML IJ SOLN
INTRAMUSCULAR | Status: DC | PRN
  Administered 2014-04-29: 4 mg via INTRAVENOUS

## 2014-04-29 MED ORDER — SODIUM CHLORIDE 0.9 % IV SOLN: INTRAVENOUS | Status: AC

## 2014-04-29 MED ORDER — ACETAMINOPHEN 325 MG PO TABS: 325.0000 mg | ORAL_TABLET | ORAL | Status: DC | PRN

## 2014-04-29 MED ORDER — HYDROMORPHONE HCL 1 MG/ML IJ SOLN: 0.2500 mg | INTRAMUSCULAR | Status: DC | PRN

## 2014-04-29 MED ORDER — HEPARIN (PORCINE) IN NACL 2-0.9 UNIT/ML-% IJ SOLN
INTRAMUSCULAR | Status: AC
  Filled 2014-04-29: qty 500

## 2014-04-29 MED ORDER — PROPOFOL 10 MG/ML IV BOLUS
INTRAVENOUS | Status: DC | PRN
  Administered 2014-04-29 (×2): 50 mg via INTRAVENOUS
  Administered 2014-04-29: 150 mg via INTRAVENOUS

## 2014-04-29 MED ORDER — LISINOPRIL 2.5 MG PO TABS
2.5000 mg | ORAL_TABLET | Freq: Every day | ORAL | Status: DC
  Administered 2014-04-29 – 2014-04-30 (×2): 2.5 mg via ORAL
  Filled 2014-04-29 (×2): qty 1

## 2014-04-29 MED ORDER — SODIUM CHLORIDE 0.9 % IV SOLN
INTRAVENOUS | Status: DC | PRN
  Administered 2014-04-29: 14:00:00 via INTRAVENOUS

## 2014-04-29 MED ORDER — SODIUM CHLORIDE 0.9 % IV SOLN
INTRAVENOUS | Status: DC | PRN
  Administered 2014-04-29 (×2): via INTRAVENOUS

## 2014-04-29 MED ORDER — SODIUM CHLORIDE 0.9 % IV SOLN
250.0000 mL | INTRAVENOUS | Status: DC | PRN
  Administered 2014-04-29: 50 mL via INTRAVENOUS

## 2014-04-29 MED ORDER — EPHEDRINE SULFATE 50 MG/ML IJ SOLN
INTRAMUSCULAR | Status: DC | PRN
  Administered 2014-04-29: 15 mg via INTRAVENOUS
  Administered 2014-04-29: 10 mg via INTRAVENOUS
  Administered 2014-04-29: 5 mg via INTRAVENOUS

## 2014-04-29 MED ORDER — CEFAZOLIN SODIUM 1-5 GM-% IV SOLN
1.0000 g | Freq: Four times a day (QID) | INTRAVENOUS | Status: AC
  Administered 2014-04-29 – 2014-04-30 (×3): 1 g via INTRAVENOUS
  Filled 2014-04-29 (×3): qty 50

## 2014-04-29 MED ORDER — ALBUMIN HUMAN 5 % IV SOLN
INTRAVENOUS | Status: DC | PRN
  Administered 2014-04-29: 14:00:00 via INTRAVENOUS

## 2014-04-29 NOTE — Progress Notes (Signed)
Coox resulted on wrong pt. Pt did not have a coox done this AM 12/23.

## 2014-04-29 NOTE — Progress Notes (Signed)
Patient Name: William Villanueva      SUBJECTIVE:back pain Breathing stable but still not normal  ddimer normal  Past Medical History  Diagnosis Date  . Polycythemia vera(238.4)   . Back pain   . Hemochromatosis   . Gout   . Hypertension   . Nephrolithiasis   . Cardiomyopathy     Nonischemic 45%.   [name] Hypospadias 1950-11-21    born with    Scheduled Meds:  Scheduled Meds: . anagrelide  1 mg Oral Q48H  . [START ON 04/30/2014] anagrelide  2 mg Oral Q48H  . aspirin EC  81 mg Oral Daily  .  ceFAZolin (ANCEF) IV  3 g Intravenous On Call  . chlorhexidine  60 mL Topical Once  . febuxostat  40 mg Oral Daily  . gentamicin irrigation  80 mg Irrigation On Call  . pantoprazole  40 mg Oral Daily  . sodium chloride  3 mL Intravenous Q12H  . tamsulosin  0.4 mg Oral Daily   Continuous Infusions: . sodium chloride     sodium chloride, acetaminophen, docusate sodium, morphine, nitroGLYCERIN, ondansetron (ZOFRAN) IV, sodium chloride, temazepam    PHYSICAL EXAM Filed Vitals:   04/29/14 0400 04/29/14 0500 04/29/14 0600 04/29/14 0750  BP:    156/43  Pulse: 45 42 42 46  Temp:    97.5 F (36.4 C)  TempSrc:    Oral  Resp: 12 14 15 19   Height:      Weight:      SpO2: 96% 96% 95% 95%    Well developed and nourished in no acute distress HENT normal Neck supple Clear Regular rate and rhythm, no murmurs or gallops Abd-soft with active BS No Clubbing cyanosis edema Skin-warm and dry A & Oriented  Grossly normal sensory and motor function   TELEMETRY: Reviewed telemetry pt in  2:1 AVBlock and higher grade haert block   Intake/Output Summary (Last 24 hours) at 04/29/14 1025 Last data filed at 04/29/14 0954  Gross per 24 hour  Intake      0 ml  Output   1900 ml  Net  -1900 ml    LABS: Basic Metabolic Panel:  Recent Labs Lab 04/28/14 1026 04/28/14 2023 04/29/14 0747  NA 138  --  137  K 4.1  --  4.2  CL 106  --  104  CO2 22  --  25  GLUCOSE 120*  --  116*   BUN 15  --  13  CREATININE 1.33 1.61* 1.39*  CALCIUM 8.9  --  8.8   Cardiac Enzymes:  Recent Labs  04/28/14 2023 04/29/14 0139 04/29/14 0747  TROPONINI 0.05* 0.05* 0.05*   CBC:  Recent Labs Lab 04/28/14 1026 04/28/14 2023 04/29/14 0747  WBC 12.4* 12.3* 11.4*  NEUTROABS 9.3*  --   --   HGB 13.2 12.8* 13.3  HCT 42.4 41.3 43.4  MCV 73.7* 73.6* 75.5*  PLT 544* 570* 559*   PROTIME:  Recent Labs  04/28/14 2023 04/29/14 0747  LABPROT 15.5* 15.2  INR 1.22 1.18   Liver Function Tests:  Recent Labs  04/28/14 1026  AST 27  ALT 26  ALKPHOS 79  BILITOT 0.7  PROT 7.1  ALBUMIN 3.6   No results for input(s): LIPASE, AMYLASE in the last 72 hours. BNP: BNP (last 3 results) No results for input(s): PROBNP in the last 8760 hours. D-Dimer:  Recent Labs  04/28/14 1026  DDIMER 0.47   Hemoglobin A1C:  Recent Labs  04/28/14 2023  HGBA1C 6.3*   Fasting Lipid Panel:  Recent Labs  04/29/14 0139  CHOL 126  HDL 29*  LDLCALC 68  TRIG 146  CHOLHDL 4.3   Thyroid Function Tests:  Recent Labs  04/28/14 2023  TSH 0.424       ASSESSMENT AND PLAN:  Principal Problem:   Mobitz type II atrioventricular block Active Problems:   Bradycardia   Cardiomyopathy  EF 35-40% so no indication for ICD Borderline and need for nearly 100% pacing so based on BLOCKHF will proceed with CRT -P implantation Have reviewed the potential benefits and risks of pacer implantation including but not limited to death, perforation of heart or lung, lead dislodgement, infection,  device malfunction  The patient and familyexpress understanding  and are willing to proceed.  =  Signed, [name] MD  04/29/2014

## 2014-04-29 NOTE — CV Procedure (Signed)
William Villanueva [id number]  [id number]  Preop TY:OMAY grade heart block  Nonischemic cardiomyopathy Postop Dx same/   Procedure:Dual chamber pacemaker with LV lead placement   Cx: None   EBL: Minimal    Dictation number [id number]  [name], MD 04/29/2014 3:18 PM

## 2014-04-29 NOTE — Progress Notes (Signed)
  Echocardiogram 2D Echocardiogram has been performed.  William Villanueva William Villanueva 04/29/2014, 8:40 AM

## 2014-04-29 NOTE — Transfer of Care (Signed)
Immediate Anesthesia Transfer of Care Note  Patient: William Villanueva  Procedure(s) Performed: Procedure(s): BI-VENTRICULAR PACEMAKER INSERTION (CRT-P) (N/A)  Patient Location: PACU  Anesthesia Type:General  Level of Consciousness: awake  Airway & Oxygen Therapy: Patient Spontanous Breathing and Patient connected to nasal cannula oxygen  Post-op Assessment: Report given to PACU RN, Post -op Vital signs reviewed and stable and Patient moving all extremities  Post vital signs: Reviewed and stable  Complications: No apparent anesthesia complications

## 2014-04-29 NOTE — Progress Notes (Signed)
C/o right calf pain after the procedure. Minimal swelling, but feels like a cord or possibly spasm of the gastroc muscle. D-dimer negative yesterday, but had been off SCD's. Doubt DVT, but will obtain LE dopplers. Would want to avoid anticoagulants post-pacemaker if possible.  Pixie Casino, MD, [facility] Attending Cardiologist [place]

## 2014-04-29 NOTE — Care Management Note (Signed)
    Page 1 of 1   04/29/2014     10:26:35 AM CARE MANAGEMENT NOTE 04/29/2014  Patient:  William Villanueva, William Villanueva   Account Number:  [id number]  Date Initiated:  04/29/2014  Documentation initiated by:  Elissa Hefty  Subjective/Objective Assessment:   adm w heart block     Action/Plan:   lives w wife, pcp dr Rosana Hoes cloward   Anticipated DC Date:     Anticipated DC Plan:  HOME/SELF CARE         Choice offered to / List presented to:             Status of service:   Medicare Important Message given?   (If response is "NO", the following Medicare IM given date fields will be blank) Date Medicare IM given:   Medicare IM given by:   Date Additional Medicare IM given:   Additional Medicare IM given by:    Discharge Disposition:    Per UR Regulation:  Reviewed for med. necessity/level of care/duration of stay  If discussed at [place] of Stay Meetings, dates discussed:    Comments:

## 2014-04-29 NOTE — Progress Notes (Signed)
*  Preliminary Results* Right lower extremity venous duplex completed. Right lower extremity is negative for deep vein thrombosis. There is no evidence of right Baker's cyst.  04/29/2014 6:38 PM  Maudry Mayhew, RVT, RDCS, RDMS

## 2014-04-29 NOTE — Anesthesia Procedure Notes (Addendum)
Procedure Name: LMA Insertion Date/Time: 04/29/2014 1:52 PM Performed by: Melina Copa, Felicha Frayne R Pre-anesthesia Checklist: Emergency Drugs available, Patient identified, Suction available, Patient being monitored and Timeout performed Patient Re-evaluated:Patient Re-evaluated prior to inductionOxygen Delivery Method: Circle system utilized Preoxygenation: Pre-oxygenation with 100% oxygen Intubation Type: IV induction LMA: LMA with gastric port inserted LMA Size: 5.0 Number of attempts: 1 Placement Confirmation: positive ETCO2 and breath sounds checked- equal and bilateral Tube secured with: Tape Dental Injury: Teeth and Oropharynx as per pre-operative assessment    Procedure Name: Intubation Date/Time: 04/29/2014 2:10 PM Performed by: Melina Copa, Javohn Basey R Pre-anesthesia Checklist: Patient identified, Emergency Drugs available, Suction available, Patient being monitored and Timeout performed Patient Re-evaluated:Patient Re-evaluated prior to inductionOxygen Delivery Method: Circle system utilized Preoxygenation: Pre-oxygenation with 100% oxygen Intubation Type: Inhalational induction Ventilation: Mask ventilation without difficulty Laryngoscope Size: Mac and 4 Grade View: Grade III Tube type: Oral Tube size: 8.0 mm Number of attempts: 1 Airway Equipment and Method: Stylet Placement Confirmation: ETT inserted through vocal cords under direct vision,  positive ETCO2 and breath sounds checked- equal and bilateral Secured at: 22 cm Tube secured with: Tape Dental Injury: Teeth and Oropharynx as per pre-operative assessment

## 2014-04-29 NOTE — Anesthesia Preprocedure Evaluation (Addendum)
Anesthesia Evaluation  Patient identified by MRN, date of birth, ID band Patient awake    Reviewed: Allergy & Precautions, H&P , NPO status , Patient's Chart, lab work & pertinent test results  Airway Mallampati: III  TM Distance: >3 FB Neck ROM: Full    Dental  (+) Teeth Intact, Dental Advisory Given   Pulmonary shortness of breath, with exertion and at rest, former smoker,  breath sounds clear to auscultation        Cardiovascular hypertension, Rhythm:Regular Rate:Normal     Neuro/Psych    GI/Hepatic negative GI ROS, Neg liver ROS,   Endo/Other    Renal/GU Renal disease     Musculoskeletal   Abdominal   Peds  Hematology   Anesthesia Other Findings   Reproductive/Obstetrics                            Anesthesia Physical Anesthesia Plan  ASA: IV  Anesthesia Plan: General   Post-op Pain Management:    Induction: Intravenous  Airway Management Planned: LMA  Additional Equipment:   Intra-op Plan:   Post-operative Plan: Extubation in OR  Informed Consent: I have reviewed the patients History and Physical, chart, labs and discussed the procedure including the risks, benefits and alternatives for the proposed anesthesia with the patient or authorized representative who has indicated his/her understanding and acceptance.   Dental advisory given  Plan Discussed with: CRNA, Anesthesiologist and Surgeon  Anesthesia Plan Comments:         Anesthesia Quick Evaluation

## 2014-04-29 NOTE — Progress Notes (Signed)
DAILY PROGRESS NOTE  Subjective:  No events overnight. Remains in Mobitz II AVB with HR in the 40's. LVEF is 35-40% by echo.   Objective:  Temp:  [97.4 F (36.3 C)-97.8 F (36.6 C)] 97.5 F (36.4 C) (12/23 0750) Pulse Rate:  [32-93] 46 (12/23 0750) Resp:  [11-24] 19 (12/23 0750) BP: (130-182)/(39-97) 156/43 mmHg (12/23 0750) SpO2:  [94 %-100 %] 95 % (12/23 0750) Weight:  [280 lb 3.3 oz (127.1 kg)-286 lb (129.729 kg)] 280 lb 3.3 oz (127.1 kg) (12/23 0300) Weight change:   Intake/Output from previous day: 12/22 0701 - 12/23 0700 In: -  Out: 1400 [Urine:1400]  Intake/Output from this shift:    Medications: Current Facility-Administered Medications  Medication Dose Route Frequency Provider Last Rate Last Dose  . 0.9 %  sodium chloride infusion  250 mL Intravenous PRN Eileen Stanford, PA-C      . 0.9 %  sodium chloride infusion   Intravenous Continuous Deboraha Sprang, MD      . acetaminophen (TYLENOL) tablet 650 mg  650 mg Oral Q4H PRN Eileen Stanford, PA-C   650 mg at 04/28/14 1836  . anagrelide (AGRYLIN) capsule 1 mg  1 mg Oral Q48H Minus Breeding, MD      . Derrill Memo ON 04/30/2014] anagrelide (AGRYLIN) capsule 2 mg  2 mg Oral Q48H Minus Breeding, MD      . aspirin EC tablet 81 mg  81 mg Oral Daily Minus Breeding, MD   81 mg at 04/28/14 2133  . ceFAZolin (ANCEF) 3 g in dextrose 5 % 50 mL IVPB  3 g Intravenous On Call Deboraha Sprang, MD      . chlorhexidine (HIBICLENS) 4 % liquid 4 application  60 mL Topical Once Deboraha Sprang, MD      . docusate sodium (COLACE) capsule 100 mg  100 mg Oral PRN Eileen Stanford, PA-C      . febuxostat (ULORIC) tablet 40 mg  40 mg Oral Daily Eileen Stanford, PA-C   40 mg at 04/28/14 2133  . gentamicin (GARAMYCIN) 80 mg in sodium chloride irrigation 0.9 % 500 mL irrigation  80 mg Irrigation On Call Deboraha Sprang, MD      . morphine (MSIR) tablet 15 mg  15 mg Oral Q3H PRN Eileen Stanford, PA-C   15 mg at 04/29/14 9735  .  nitroGLYCERIN (NITROSTAT) SL tablet 0.4 mg  0.4 mg Sublingual Q5 Min x 3 PRN Eileen Stanford, PA-C      . ondansetron [facility]) injection 4 mg  4 mg Intravenous Q6H PRN Eileen Stanford, PA-C      . pantoprazole (PROTONIX) EC tablet 40 mg  40 mg Oral Daily Eileen Stanford, PA-C   40 mg at 04/28/14 2133  . sodium chloride 0.9 % injection 3 mL  3 mL Intravenous Q12H Eileen Stanford, PA-C   3 mL at 04/28/14 2235  . sodium chloride 0.9 % injection 3 mL  3 mL Intravenous PRN Eileen Stanford, PA-C      . tamsulosin [facility]) capsule 0.4 mg  0.4 mg Oral Daily Eileen Stanford, PA-C   0.4 mg at 04/28/14 2133  . temazepam (RESTORIL) capsule 22.5 mg  22.5 mg Oral QHS PRN Minus Breeding, MD       Facility-Administered Medications Ordered in Other Encounters  Medication Dose Route Frequency Provider Last Rate Last Dose  . 0.9 %  sodium chloride infusion   Intravenous Continuous Rudell Cobb  Marin Olp, MD   Stopped at 05/16/13 1115    Physical Exam: General appearance: alert and no distress Lungs: clear to auscultation bilaterally Heart: regular bradycardia Extremities: extremities normal, atraumatic, no cyanosis or edema  Lab Results: Results for orders placed or performed during the hospital encounter of 04/28/14 (from the past 48 hour(s))  Comprehensive metabolic panel     Status: Abnormal   Collection Time: 04/28/14 10:26 AM  Result Value Ref Range   Sodium 138 135 - 145 mmol/L    Comment: Please note change in reference range.   Potassium 4.1 3.5 - 5.1 mmol/L    Comment: Please note change in reference range.   Chloride 106 96 - 112 mEq/L   CO2 22 19 - 32 mmol/L   Glucose, Bld 120 (H) 70 - 99 mg/dL   BUN 15 6 - 23 mg/dL   Creatinine, Ser 1.33 0.50 - 1.35 mg/dL   Calcium 8.9 8.4 - 10.5 mg/dL   Total Protein 7.1 6.0 - 8.3 g/dL   Albumin 3.6 3.5 - 5.2 g/dL   AST 27 0 - 37 U/L   ALT 26 0 - 53 U/L   Alkaline Phosphatase 79 39 - 117 U/L   Total Bilirubin 0.7 0.3 - 1.2 mg/dL   GFR  calc non Af Amer 56 (L) >90 mL/min   GFR calc Af Amer 65 (L) >90 mL/min    Comment: (NOTE) The eGFR has been calculated using the CKD EPI equation. This calculation has not been validated in all clinical situations. eGFR's persistently <90 mL/min signify possible Chronic Kidney Disease.    Anion gap 10 5 - 15  CBC with Differential     Status: Abnormal   Collection Time: 04/28/14 10:26 AM  Result Value Ref Range   WBC 12.4 (H) 4.0 - 10.5 K/uL   RBC 5.75 4.22 - 5.81 MIL/uL   Hemoglobin 13.2 13.0 - 17.0 g/dL   HCT 42.4 39.0 - 52.0 %   MCV 73.7 (L) 78.0 - 100.0 fL   MCH 23.0 (L) 26.0 - 34.0 pg   MCHC 31.1 30.0 - 36.0 g/dL   RDW 16.9 (H) 11.5 - 15.5 %   Platelets 544 (H) 150 - 400 K/uL   Neutrophils Relative % 75 43 - 77 %   Neutro Abs 9.3 (H) 1.7 - 7.7 K/uL   Lymphocytes Relative 14 12 - 46 %   Lymphs Abs 1.7 0.7 - 4.0 K/uL   Monocytes Relative 8 3 - 12 %   Monocytes Absolute 1.0 0.1 - 1.0 K/uL   Eosinophils Relative 1 0 - 5 %   Eosinophils Absolute 0.2 0.0 - 0.7 K/uL   Basophils Relative 2 (H) 0 - 1 %   Basophils Absolute 0.2 (H) 0.0 - 0.1 K/uL  D-dimer, quantitative     Status: None   Collection Time: 04/28/14 10:26 AM  Result Value Ref Range   D-Dimer, Quant 0.47 0.00 - 0.48 ug/mL-FEU    Comment:        AT THE INHOUSE ESTABLISHED CUTOFF VALUE OF 0.48 ug/mL FEU, THIS ASSAY HAS BEEN DOCUMENTED IN THE LITERATURE TO HAVE A SENSITIVITY AND NEGATIVE PREDICTIVE VALUE OF AT LEAST 98 TO 99%.  THE TEST RESULT SHOULD BE CORRELATED WITH AN ASSESSMENT OF THE CLINICAL PROBABILITY OF DVT / VTE.   Troponin I     Status: Abnormal   Collection Time: 04/28/14 10:26 AM  Result Value Ref Range   Troponin I 0.04 (H) <0.031 ng/mL    Comment:  PERSISTENTLY INCREASED TROPONIN VALUES IN THE RANGE OF 0.04-0.49 ng/mL CAN BE SEEN IN:       -UNSTABLE ANGINA       -CONGESTIVE HEART FAILURE       -MYOCARDITIS       -CHEST TRAUMA       -ARRYHTHMIAS       -LATE PRESENTING MYOCARDIAL  INFARCTION       -COPD   CLINICAL FOLLOW-UP RECOMMENDED. Please note change in reference range.   Urinalysis, Routine w reflex microscopic     Status: Abnormal   Collection Time: 04/28/14 11:40 AM  Result Value Ref Range   Color, Urine YELLOW YELLOW   APPearance CLOUDY (A) CLEAR   Specific Gravity, Urine 1.020 1.005 - 1.030   pH 5.5 5.0 - 8.0   Glucose, UA NEGATIVE NEGATIVE mg/dL   Hgb urine dipstick NEGATIVE NEGATIVE   Bilirubin Urine NEGATIVE NEGATIVE   Ketones, ur NEGATIVE NEGATIVE mg/dL   Protein, ur 100 (A) NEGATIVE mg/dL   Urobilinogen, UA 0.2 0.0 - 1.0 mg/dL   Nitrite NEGATIVE NEGATIVE   Leukocytes, UA TRACE (A) NEGATIVE  Urine microscopic-add on     Status: None   Collection Time: 04/28/14 11:40 AM  Result Value Ref Range   WBC, UA 3-6 <3 WBC/hpf   Bacteria, UA RARE RARE  Brain natriuretic peptide     Status: Abnormal   Collection Time: 04/28/14 12:18 PM  Result Value Ref Range   B Natriuretic Peptide 326.9 (H) 0.0 - 100.0 pg/mL    Comment: Please note change in reference range.  MRSA PCR Screening     Status: None   Collection Time: 04/28/14  7:10 PM  Result Value Ref Range   MRSA by PCR NEGATIVE NEGATIVE    Comment:        The GeneXpert MRSA Assay (FDA approved for NASAL specimens only), is one component of a comprehensive MRSA colonization surveillance program. It is not intended to diagnose MRSA infection nor to guide or monitor treatment for MRSA infections.   Protime-INR     Status: Abnormal   Collection Time: 04/28/14  8:23 PM  Result Value Ref Range   Prothrombin Time 15.5 (H) 11.6 - 15.2 seconds   INR 1.22 0.00 - 1.49  CBC     Status: Abnormal   Collection Time: 04/28/14  8:23 PM  Result Value Ref Range   WBC 12.3 (H) 4.0 - 10.5 K/uL   RBC 5.61 4.22 - 5.81 MIL/uL   Hemoglobin 12.8 (L) 13.0 - 17.0 g/dL   HCT 41.3 39.0 - 52.0 %   MCV 73.6 (L) 78.0 - 100.0 fL   MCH 22.8 (L) 26.0 - 34.0 pg   MCHC 31.0 30.0 - 36.0 g/dL   RDW 16.8 (H) 11.5 - 15.5  %   Platelets 570 (H) 150 - 400 K/uL  Creatinine, serum     Status: Abnormal   Collection Time: 04/28/14  8:23 PM  Result Value Ref Range   Creatinine, Ser 1.61 (H) 0.50 - 1.35 mg/dL   GFR calc non Af Amer 44 (L) >90 mL/min   GFR calc Af Amer 51 (L) >90 mL/min    Comment: (NOTE) The eGFR has been calculated using the CKD EPI equation. This calculation has not been validated in all clinical situations. eGFR's persistently <90 mL/min signify possible Chronic Kidney Disease.   Troponin I-(serum)     Status: Abnormal   Collection Time: 04/28/14  8:23 PM  Result Value Ref Range   Troponin I  0.05 (H) <0.031 ng/mL    Comment:        PERSISTENTLY INCREASED TROPONIN VALUES IN THE RANGE OF 0.04-0.49 ng/mL CAN BE SEEN IN:       -UNSTABLE ANGINA       -CONGESTIVE HEART FAILURE       -MYOCARDITIS       -CHEST TRAUMA       -ARRYHTHMIAS       -LATE PRESENTING MYOCARDIAL INFARCTION       -COPD   CLINICAL FOLLOW-UP RECOMMENDED. Please note change in reference range.   TSH     Status: None   Collection Time: 04/28/14  8:23 PM  Result Value Ref Range   TSH 0.424 0.350 - 4.500 uIU/mL  Hemoglobin A1c     Status: Abnormal   Collection Time: 04/28/14  8:23 PM  Result Value Ref Range   Hgb A1c MFr Bld 6.3 (H) <5.7 %    Comment: (NOTE)                                                                       According to the ADA Clinical Practice Recommendations for 2011, when HbA1c is used as a screening test:  >=6.5%   Diagnostic of Diabetes Mellitus           (if abnormal result is confirmed) 5.7-6.4%   Increased risk of developing Diabetes Mellitus References:Diagnosis and Classification of Diabetes Mellitus,Diabetes JXBJ,4782,95(AOZHY 1):S62-S69 and Standards of Medical Care in         Diabetes - 2011,Diabetes Care,2011,34 (Suppl 1):S11-S61.    Mean Plasma Glucose 134 (H) <117 mg/dL    Comment: Performed at [facility]  Lipid panel     Status: Abnormal   Collection Time:  04/29/14  1:39 AM  Result Value Ref Range   Cholesterol 126 0 - 200 mg/dL   Triglycerides 146 <150 mg/dL   HDL 29 (L) >39 mg/dL   Total CHOL/HDL Ratio 4.3 RATIO   VLDL 29 0 - 40 mg/dL   LDL Cholesterol 68 0 - 99 mg/dL    Comment:        Total Cholesterol/HDL:CHD Risk Coronary Heart Disease Risk Table                     Men   Women  1/2 Average Risk   3.4   3.3  Average Risk       5.0   4.4  2 X Average Risk   9.6   7.1  3 X Average Risk  23.4   11.0        Use the calculated Patient Ratio above and the CHD Risk Table to determine the patient's CHD Risk.        ATP III CLASSIFICATION (LDL):  <100     mg/dL   Optimal  100-129  mg/dL   Near or Above                    Optimal  130-159  mg/dL   Borderline  160-189  mg/dL   High  >190     mg/dL   Very High   Troponin I-(serum)     Status: Abnormal   Collection Time: 04/29/14  1:39  AM  Result Value Ref Range   Troponin I 0.05 (H) <0.031 ng/mL    Comment:        PERSISTENTLY INCREASED TROPONIN VALUES IN THE RANGE OF 0.04-0.49 ng/mL CAN BE SEEN IN:       -UNSTABLE ANGINA       -CONGESTIVE HEART FAILURE       -MYOCARDITIS       -CHEST TRAUMA       -ARRYHTHMIAS       -LATE PRESENTING MYOCARDIAL INFARCTION       -COPD   CLINICAL FOLLOW-UP RECOMMENDED. Please note change in reference range.   Carboxyhemoglobin     Status: Abnormal   Collection Time: 04/29/14  3:43 AM  Result Value Ref Range   Total hemoglobin 11.1 (L) 13.5 - 18.0 g/dL   O2 Saturation 65.4 %   Carboxyhemoglobin 2.0 (H) 0.5 - 1.5 %   Methemoglobin 0.6 0.0 - 1.5 %  Troponin I-(serum)     Status: Abnormal   Collection Time: 04/29/14  7:47 AM  Result Value Ref Range   Troponin I 0.05 (H) <0.031 ng/mL    Comment:        PERSISTENTLY INCREASED TROPONIN VALUES IN THE RANGE OF 0.04-0.49 ng/mL CAN BE SEEN IN:       -UNSTABLE ANGINA       -CONGESTIVE HEART FAILURE       -MYOCARDITIS       -CHEST TRAUMA       -ARRYHTHMIAS       -LATE PRESENTING MYOCARDIAL  INFARCTION       -COPD   CLINICAL FOLLOW-UP RECOMMENDED. Please note change in reference range.   CBC     Status: Abnormal   Collection Time: 04/29/14  7:47 AM  Result Value Ref Range   WBC 11.4 (H) 4.0 - 10.5 K/uL   RBC 5.75 4.22 - 5.81 MIL/uL   Hemoglobin 13.3 13.0 - 17.0 g/dL   HCT 43.4 39.0 - 52.0 %   MCV 75.5 (L) 78.0 - 100.0 fL   MCH 23.1 (L) 26.0 - 34.0 pg   MCHC 30.6 30.0 - 36.0 g/dL   RDW 16.9 (H) 11.5 - 15.5 %   Platelets 559 (H) 150 - 400 K/uL  Basic metabolic panel     Status: Abnormal   Collection Time: 04/29/14  7:47 AM  Result Value Ref Range   Sodium 137 135 - 145 mmol/L    Comment: Please note change in reference range.   Potassium 4.2 3.5 - 5.1 mmol/L    Comment: Please note change in reference range.   Chloride 104 96 - 112 mEq/L   CO2 25 19 - 32 mmol/L   Glucose, Bld 116 (H) 70 - 99 mg/dL   BUN 13 6 - 23 mg/dL   Creatinine, Ser 1.39 (H) 0.50 - 1.35 mg/dL   Calcium 8.8 8.4 - 10.5 mg/dL   GFR calc non Af Amer 53 (L) >90 mL/min   GFR calc Af Amer 61 (L) >90 mL/min    Comment: (NOTE) The eGFR has been calculated using the CKD EPI equation. This calculation has not been validated in all clinical situations. eGFR's persistently <90 mL/min signify possible Chronic Kidney Disease.    Anion gap 8 5 - 15  Protime-INR     Status: None   Collection Time: 04/29/14  7:47 AM  Result Value Ref Range   Prothrombin Time 15.2 11.6 - 15.2 seconds   INR 1.18 0.00 - 1.49    Imaging: Dg  Chest 2 View  04/28/2014   CLINICAL DATA:  Bradycardia and shortness of breath for several days  EXAM: CHEST  2 VIEW  COMPARISON:  April 18, 2013  FINDINGS: There is mild scarring in the left lung base. There is no edema or consolidation. Heart size and pulmonary vascularity are normal. No adenopathy. There is degenerative change in the thoracic spine. There is evidence of old rib trauma on the left.  IMPRESSION: Slight scarring left base.  No edema or consolidation.   Electronically  Signed   By: Lowella Grip M.D.   On: 04/28/2014 11:40    Assessment:  Principal Problem:   Mobitz type II atrioventricular block Active Problems:   Bradycardia   Cardiomyopathy   Plan:  1. Plan for pacer today - possibly CRT-P given expected need of long-term pacing with systolic CHF. Does not appear to meet criteria for CRT-D.  Time Spent Directly with Patient:  15 minutes  Length of Stay:  LOS: 1 day   Pixie Casino, MD, [facility] Attending Cardiologist [facility]  HILTY,Kenneth C 04/29/2014, 9:37 AM

## 2014-04-29 NOTE — Anesthesia Postprocedure Evaluation (Signed)
  Anesthesia Post-op Note  Patient: Nurse, learning disability  Procedure(s) Performed: Procedure(s): BI-VENTRICULAR PACEMAKER INSERTION (CRT-P) (N/A)  Patient Location: PACU  Anesthesia Type: General   Level of Consciousness: awake, alert  and oriented  Airway and Oxygen Therapy: Patient Spontanous Breathing  Post-op Pain: mild  Post-op Assessment: Post-op Vital signs reviewed  Post-op Vital Signs: Reviewed  Last Vitals:  Filed Vitals:   04/29/14 1705  BP: 136/61  Pulse: 97  Temp: 36.3 C  Resp: 23    Complications: No apparent anesthesia complications

## 2014-04-30 ENCOUNTER — Inpatient Hospital Stay (HOSPITAL_COMMUNITY): Payer: 59

## 2014-04-30 DIAGNOSIS — I517 Cardiomegaly: Secondary | ICD-10-CM

## 2014-04-30 MED ORDER — PERFLUTREN LIPID MICROSPHERE
INTRAVENOUS | Status: AC
  Administered 2014-04-30: 2 mL
  Filled 2014-04-30: qty 10

## 2014-04-30 MED ORDER — PERFLUTREN LIPID MICROSPHERE
1.0000 mL | INTRAVENOUS | Status: AC | PRN
  Filled 2014-04-30: qty 10

## 2014-04-30 NOTE — Op Note (Signed)
William Villanueva, William Villanueva NO.:  [id number]  MEDICAL RECORD NO.:  [id number]  LOCATION:  [id number]                        FACILITY:  [place]  PHYSICIAN:  Deboraha Sprang, MD, FACCDATE OF BIRTH:  1950-06-19  DATE OF PROCEDURE:  04/29/2014 DATE OF DISCHARGE:                              OPERATIVE REPORT   PREOPERATIVE DIAGNOSIS:  High-grade heart block, nonischemic cardiomyopathy, ejection fraction 35%.  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  PROCEDURE:  Dual-chamber pacemaker implantation with left ventricular lead placement.  DESCRIPTION OF PROCEDURE:  Following obtaining informed consent, the patient was brought to electrophysiology laboratory and placed on the fluoroscopic table in supine position.  Because of issues with back pain, signs and symptoms suggestive of sleep apnea, it was elected to submit him to general anesthesia which was taken under the care of Dr. Oletta Lamas.  This was accomplished, after routine prep and drape the lidocaine was infiltrated in the prepectoral subclavicular region. Incision was made and carried down to the prepectoral fascia.  With electrocautery and sharp dissection, a pocket was formed similarly and hemostasis was obtained.  Thereafter, attention was turned to gain access to the extrathoracic left subclavian vein which was accomplished without difficulty without the aspiration of air or puncture of the artery.  Sequentially, a 7- [place], 9.5-French, and 7-French sheaths were placed through which were passed a St. Jude 1688 TC 58-cm active fixation ventricular lead, serial #[id number], a St. Jude 135 coronary sinus cannulation catheter, and a St. Jude 1688 TC 52-cm active fixation atrial lead, serial #[id number].  Under fluoroscopic guidance, the RV lead was manipulated into the distal septum where the bipolar R-wave was 5.8 with a pace impedance of 950, a threshold 0.5  V at 0.5 milliseconds, current threshold 0.5 mA, and there was no diaphragmatic pacing at 10 V.  The current of injury was brisk. This lead was secured to the prepectoral fascia.  We then were able to gain access to the coronary sinus with a little bit of difficulty because of a very tortuous entrance.  We ended up using a double Wholey wire technique and then had sub selection of a large bifurcating branch that allowed [place] to pass a wire onto the lateral wall to the distal tip in the junction between the mid and distal third.  In this location, the 2-3 configuration and amplitude of 14 with a pace impedance of 658, a threshold 0.4 V at 0.5 milliseconds, current threshold 0.5 mA, there was no diaphragmatic pacing at 10 V.  The 9.5- French sheath was removed and the atrial lead was deployed.  This was deployed in the very large right atrium and the right atrial appendage where the bipolar P-wave was 3 with a pace impedance of 512, a threshold of 1.3 V at 0.5 milliseconds.  Current threshold was 2.3 mA. This lead was secured to the prepectoral fascia, and then the LV sheath was removed and this lead was also secured to the prepectoral fascia.  The leads were then attached to a [place] pulse generator [id number], model [id number].  P synchronous pacing was identified and the heart rate gradually decreased with sinus rate of  100 to about 85.  The bipolar P-wave was greater than 5 with impedance of 580, threshold 1.2 at 0.5.  There was no measured ventricular rate, impedance was 960 with a threshold 0.5 at 0.5, and the LV impedance was 710 with a threshold 0.7 at 0.5.  The pocket was copiously irrigated with antibiotic containing saline solution.  The leads and the pulse generator were placed in the pocket and secured to the prepectoral fascia.  The wound was then closed in 3 layers in normal fashion.  The wound was washed, dried, and a Dermabond dressing was applied.  Needle counts,  sponge counts, and instrument counts were correct at the end of the procedure according to the staff.  The patient tolerated the procedure without apparent complication.     Deboraha Sprang, MD, [facility]     SCK/MEDQ  D:  04/29/2014  T:  04/30/2014  Job:  [id number]

## 2014-04-30 NOTE — Discharge Instructions (Signed)
° ° °  Supplemental Discharge Instructions for  Pacemaker/Defibrillator Patients  Activity No heavy lifting or vigorous activity with your left/right arm for 6 to 8 weeks.  Do not raise your left/right arm above your head for one week.  Gradually raise your affected arm as drawn below.              12/26                  12/27                            12/28                  12/29  NO DRIVING for  1 week   ; you may begin driving on  17/71/16 WOUND CARE - Keep the wound area clean and dry.  Do not get this area wet for one week. No showers for one week; you may shower on     . - The tape/steri-strips on your wound will fall off; do not pull them off.  No bandage is needed on the site.  DO  NOT apply any creams, oils, or ointments to the wound area. - If you notice any drainage or discharge from the wound, any swelling or bruising at the site, or you develop a fever > 101? F after you are discharged home, call the office at once.  Special Instructions - You are still able to use cellular telephones; use the ear opposite the side where you have your pacemaker/defibrillator.  Avoid carrying your cellular phone near your device. - When traveling through airports, show security personnel your identification card to avoid being screened in the metal detectors.  Ask the security personnel to use the hand wand. - Avoid arc welding equipment, MRI testing (magnetic resonance imaging), TENS units (transcutaneous nerve stimulators).  Call the office for questions about other devices. - Avoid electrical appliances that are in poor condition or are not properly grounded. - Microwave ovens are safe to be near or to operate.  Additional information for defibrillator patients should your device go off: - If your device goes off ONCE and you feel fine afterward, notify the device clinic nurses. - If your device goes off ONCE and you do not feel well afterward, call 911. - If your device goes off TWICE, call  911. - If your device goes off THREE times in one day, call 911.  DO NOT DRIVE YOURSELF OR A FAMILY MEMBER WITH A DEFIBRILLATOR TO THE HOSPITAL--CALL 911.

## 2014-04-30 NOTE — Discharge Summary (Signed)
ELECTROPHYSIOLOGY PROCEDURE DISCHARGE SUMMARY    Patient ID: William Villanueva,  MRN: [id number], DOB/AGE: [birth year] 62 y.o.  Admit date: 04/28/2014 Discharge date: 04/30/2014  Primary Care Physician: Thurman Coyer, MD Primary Cardiologist: Stanford Breed Electrophysiologist: Caryl Comes  Primary Discharge Diagnosis:  Symptomatic bradycardia with high grade heart block and cardiomyopathy status post pacemaker implantation this admission  Secondary Discharge Diagnosis:  1.  Polycythemia vera 2.  Gout 3.  Hypertension   Allergies  Allergen Reactions  . Prednisone Other (See Comments)    Abdominal pain  . Wellbutrin [Bupropion] Hives     Procedures This Admission:  1.  Implantation of a STJ CRTP on 04-29-14 by Dr Caryl Comes.  The patient received a STJ model number Quadra Allure PPM with model number 4403 right atrial lead, 1688 right ventricular lead and 1458 left ventricular lead. There were no immediate post procedure complications. 2.  CXR on 04-30-2014 demonstrated no pneumothorax status post device implantation.   Brief HPI/Hospital Course:  William Villanueva is a 63 y.o. male with a past medical history of hypertension, non-ischemic cardiomyopathy, DDD and polycythemia vera.  On the day of admission, he developed increased shortness of breath and presented to the ER for evaluation.  He was found to be in high grade heart block with ventricular rates in the 50's.  Echocardiogram demonstrated and EF of 35-40%, mild MR, PA pressure of 59.  There were no reversible causes of heart block identified.  Risks, benefits, and alternatives to CRTP implantation were reviewed with the patient who wished to proceed. The patient underwent implantation of a STJ CRTP with details as outlined above.  He  was monitored on telemetry overnight which demonstrated sinus rhythm with ventricular pacing.  Left chest was without hematoma or ecchymosis.  The device was interrogated and found to be functioning  normally.  CXR was obtained and demonstrated no pneumothorax status post device implantation.  He had hypotension overnight which was evaluated with echocardiogram.  This demonstrated no significant  effusion post device implant.   Wound care, arm mobility, and restrictions were reviewed with the patient.  Dr Caryl Comes examined the patient and considered them stable for discharge to home.    Discharge Vitals: Blood pressure 118/73, pulse 72, temperature 98 F (36.7 C), temperature source Oral, resp. rate 16, height 5' 10"  (1.778 m), weight 277 lb 9 oz (125.9 kg), SpO2 95 %.    Labs:   Lab Results  Component Value Date   WBC 11.4* 04/29/2014   HGB 13.3 04/29/2014   HCT 43.4 04/29/2014   MCV 75.5* 04/29/2014   PLT 559* 04/29/2014    Recent Labs Lab 04/28/14 1026  04/29/14 0747  NA 138  --  137  K 4.1  --  4.2  CL 106  --  104  CO2 22  --  25  BUN 15  --  13  CREATININE 1.33  < > 1.39*  CALCIUM 8.9  --  8.8  PROT 7.1  --   --   BILITOT 0.7  --   --   ALKPHOS 79  --   --   ALT 26  --   --   AST 27  --   --   GLUCOSE 120*  --  116*  < > = values in this interval not displayed.  Discharge Medications:    Medication List    ASK your doctor about these medications        amLODipine 5 MG tablet  Commonly known as:  NORVASC  Take 5 mg by mouth at bedtime.     anagrelide 1 MG capsule  Commonly known as:  AGRYLIN  Take 2 capsules a day alternating with 1 capsule a day.     aspirin 81 MG tablet  Take 81 mg by mouth daily.     COLCRYS 0.6 MG tablet  Generic drug:  colchicine  Take 0.6 mg by mouth as needed.     docusate sodium 100 MG capsule  Commonly known as:  COLACE  Take 100 mg by mouth as needed.     omeprazole 20 MG capsule  Commonly known as:  PRILOSEC  Take 20 mg by mouth daily.     oxymorphone 5 MG tablet  Commonly known as:  OPANA  Take 5 mg by mouth every 4 (four) hours as needed for pain.     PROBIATA PO  Take by mouth every morning.     tamsulosin 0.4  MG Caps capsule  Commonly known as:  FLOMAX  Take 0.4 mg by mouth as needed.     temazepam 22.5 MG capsule  Commonly known as:  RESTORIL  Take 1 capsule (22.5 mg total) by mouth at bedtime as needed for sleep.     ULORIC 40 MG tablet  Generic drug:  febuxostat  Take 40 mg by mouth daily.        Disposition: home    Duration of Discharge Encounter: Greater than 30 minutes including physician time.  Signed,

## 2014-04-30 NOTE — Progress Notes (Signed)
Patient Name: William Villanueva      SUBJECTIVE feels considerably better  Past Medical History  Diagnosis Date  . Polycythemia vera(238.4)   . Back pain   . Hemochromatosis   . Gout   . Hypertension   . Nephrolithiasis   . Cardiomyopathy     Nonischemic 45%.   [name] Hypospadias 1950/09/26    born with    Scheduled Meds:  Scheduled Meds: . anagrelide  1 mg Oral Q48H  . anagrelide  2 mg Oral Q48H  . aspirin EC  81 mg Oral Daily  . carvedilol  3.125 mg Oral BID WC  .  ceFAZolin (ANCEF) IV  1 g Intravenous Q6H  . febuxostat  40 mg Oral Daily  . lisinopril  2.5 mg Oral Daily  . pantoprazole  40 mg Oral Daily  . sodium chloride  3 mL Intravenous Q12H  . tamsulosin  0.4 mg Oral Daily   Continuous Infusions:  sodium chloride, acetaminophen, docusate sodium, morphine, nitroGLYCERIN, ondansetron (ZOFRAN) IV, sodium chloride, temazepam    PHYSICAL EXAM Filed Vitals:   04/30/14 0000 04/30/14 0456 04/30/14 0459 04/30/14 0644  BP: 94/25  87/34 126/62  Pulse: 86  72 78  Temp: 98.5 F (36.9 C)     TempSrc: Oral     Resp: 14  13 24   Height:      Weight:  277 lb 9 oz (125.9 kg)    SpO2: 95%  93% 94%  Well developed and nourished in no acute distress HENT normal Neck supple  Clear Regular rate and rhythm, no murmurs or gallops no rub Pocket without hematoma Abd-soft with active BS No Clubbing cyanosis edema Skin-warm and dry A & Oriented  Grossly normal sensory and motor function  TELEMETRY: Reviewed telemetry pt in P-synchronous/ AV  pacing     Intake/Output Summary (Last 24 hours) at 04/30/14 0759 Last data filed at 04/30/14 0300  Gross per 24 hour  Intake   1330 ml  Output    750 ml  Net    580 ml    LABS: Basic Metabolic Panel:  Recent Labs Lab 04/28/14 1026 04/28/14 2023 04/29/14 0747  NA 138  --  137  K 4.1  --  4.2  CL 106  --  104  CO2 22  --  25  GLUCOSE 120*  --  116*  BUN 15  --  13  CREATININE 1.33 1.61* 1.39*  CALCIUM 8.9  --   8.8   Cardiac Enzymes:  Recent Labs  04/28/14 2023 04/29/14 0139 04/29/14 0747  TROPONINI 0.05* 0.05* 0.05*   CBC:  Recent Labs Lab 04/28/14 1026 04/28/14 2023 04/29/14 0747  WBC 12.4* 12.3* 11.4*  NEUTROABS 9.3*  --   --   HGB 13.2 12.8* 13.3  HCT 42.4 41.3 43.4  MCV 73.7* 73.6* 75.5*  PLT 544* 570* 559*   PROTIME:  Recent Labs  04/28/14 2023 04/29/14 0747  LABPROT 15.5* 15.2  INR 1.22 1.18   Liver Function Tests:  Recent Labs  04/28/14 1026  AST 27  ALT 26  ALKPHOS 79  BILITOT 0.7  PROT 7.1  ALBUMIN 3.6   No results for input(s): LIPASE, AMYLASE in the last 72 hours. BNP: BNP (last 3 results) No results for input(s): PROBNP in the last 8760 hours. D-Dimer:  Recent Labs  04/28/14 1026  DDIMER 0.47   Hemoglobin A1C:  Recent Labs  04/28/14 2023  HGBA1C 6.3*   Fasting Lipid Panel:  Recent  Labs  04/29/14 0139  CHOL 126  HDL 29*  LDLCALC 68  TRIG 146  CHOLHDL 4.3   Thyroid Function Tests:  Recent Labs  04/28/14 2023  TSH 0.424   Anemia Panel: No results for input(s): VITAMINB12, FOLATE, FERRITIN, TIBC, IRON, RETICCTPCT in the last 72 hours.   Device Interrogation:* normla device function CXR pending Echo pending   ASSESSMENT AND PLAN:  Principal Problem:   Mobitz type II atrioventricular block Active Problems:   Bradycardia   Cardiomyopathy hypotension last pm  RN thinks related to BP cuff.  No symptoms but will check echo And with escape rhythm this am will discharge to home if all studies ok   Signed, [name] MD  04/30/2014

## 2014-04-30 NOTE — Progress Notes (Signed)
Echocardiogram 2D Echocardiogram limited with Definity has been performed.  William Villanueva 04/30/2014, 8:23 AM

## 2014-05-11 ENCOUNTER — Telehealth: Payer: Self-pay | Admitting: Internal Medicine

## 2014-05-11 NOTE — Telephone Encounter (Signed)
New message    1. What dental office are you calling from? Yes   2. What is your office phone and fax number? [phone] /  fax [phone]   3. What type of procedure is the patient having performed? Routine cleaning .   4. What date is procedure scheduled? In the office now   5. What is your question (ex. Antibiotics prior to procedure, holding medication-we need to know how long dentist wants pt to hold med)? Does patient requires pre-med or there waiting period . S/p pacemaker on 12/23.

## 2014-05-11 NOTE — Telephone Encounter (Signed)
Spoke with [employer]. Informed her patient does not need abx prior to procedure, per device clinic. Also informed her we typically ask they wait 6 weeks post implant for routine dental cleaning. She verbalized understanding and states patient will be rescheduled.

## 2014-05-13 ENCOUNTER — Telehealth: Payer: Self-pay | Admitting: Internal Medicine

## 2014-05-13 ENCOUNTER — Ambulatory Visit (INDEPENDENT_AMBULATORY_CARE_PROVIDER_SITE_OTHER): Payer: 59 | Admitting: *Deleted

## 2014-05-13 DIAGNOSIS — R001 Bradycardia, unspecified: Secondary | ICD-10-CM | POA: Diagnosis not present

## 2014-05-13 DIAGNOSIS — I429 Cardiomyopathy, unspecified: Secondary | ICD-10-CM | POA: Diagnosis not present

## 2014-05-13 DIAGNOSIS — I420 Dilated cardiomyopathy: Secondary | ICD-10-CM | POA: Diagnosis not present

## 2014-05-13 LAB — MDC_IDC_ENUM_SESS_TYPE_INCLINIC
Battery Remaining Longevity: 120 mo
Battery Voltage: 3.05 V
Brady Statistic RA Percent Paced: 11 %
Brady Statistic RV Percent Paced: 37 %
Date Time Interrogation Session: 20160106114251
Implantable Pulse Generator Model: 3242
Implantable Pulse Generator Serial Number: 7701275
Lead Channel Impedance Value: 487.5 Ohm
Lead Channel Impedance Value: 625 Ohm
Lead Channel Impedance Value: 637.5 Ohm
Lead Channel Pacing Threshold Amplitude: 0.75 V
Lead Channel Pacing Threshold Amplitude: 1 V
Lead Channel Pacing Threshold Amplitude: 1.375 V
Lead Channel Pacing Threshold Pulse Width: 0.4 ms
Lead Channel Pacing Threshold Pulse Width: 0.4 ms
Lead Channel Pacing Threshold Pulse Width: 0.4 ms
Lead Channel Sensing Intrinsic Amplitude: 3.1 mV
Lead Channel Sensing Intrinsic Amplitude: 7.4 mV
Lead Channel Setting Pacing Amplitude: 2 V
Lead Channel Setting Pacing Amplitude: 2 V
Lead Channel Setting Pacing Amplitude: 2.375
Lead Channel Setting Pacing Pulse Width: 0.4 ms
Lead Channel Setting Pacing Pulse Width: 0.4 ms
Lead Channel Setting Sensing Sensitivity: 2 mV

## 2014-05-13 MED ORDER — CARVEDILOL 6.25 MG PO TABS: 6.2500 mg | ORAL_TABLET | Freq: Two times a day (BID) | ORAL | Status: DC

## 2014-05-13 NOTE — Telephone Encounter (Signed)
Informed pt wife that transmission was received.  

## 2014-05-13 NOTE — Progress Notes (Signed)
Wound check appointment. Steri-strips removed. Wound without redness or edema. Incision edges approximated, wound well healed. Normal device function. Thresholds, sensing, and impedances consistent with implant measurements. Device programmed at 3.5V/auto capture programmed on for extra safety margin until 3 month visit. Histogram distribution appropriate for patient and level of activity. 419 false  mode switches. Rhythm today shows ST @ 110bpm with BiV pacing 37%.  Quick opt done and device AV delays reprogrammed 180/160->160/122msec.  His Amlodipine was discontinued and he was started on Carvedilol 6.25mg  bid per Dr. Caryl Comes and he will follow up with Dr. Stanford Breed in a few weeks.   Patient educated about wound care, arm mobility, lifting restrictions. ROV in 3 months with implanting physician.

## 2014-05-13 NOTE — Telephone Encounter (Signed)
New message      Need help to do a remote transmission

## 2014-05-15 ENCOUNTER — Telehealth: Payer: Self-pay | Admitting: Internal Medicine

## 2014-05-15 NOTE — Telephone Encounter (Signed)
Spoke with patient and wife today. Saw PCP Dr. Rowland Lathe today. BP was 80/60 and then 100/80 after re-hydrating. Dr. Rowland Lathe advised him to decrease carvedilol to 3.125mg  bid. He is asymptomatic. Wanted your opinion on this.

## 2014-05-15 NOTE — Telephone Encounter (Signed)
Pt c/o medication issue: 1. Name of Medication: teverlol 2. How are you currently taking this medication (dosage and times per day)? 6.52m 1 in am 1 in pm 3. Are you having a reaction (difficulty breathing--STAT)?  No 4. What is your medication issue? Pt's wife calling stating that pt was recently put on this medication after Dr. KCaryl Comesdid pt's pacemaker. Pt was seen by his PCP Dr. SRowland Lathetoday and was told that the medication was making his bp too low and to cut dosage in half.

## 2014-05-18 ENCOUNTER — Ambulatory Visit (INDEPENDENT_AMBULATORY_CARE_PROVIDER_SITE_OTHER): Payer: 59 | Admitting: *Deleted

## 2014-05-18 ENCOUNTER — Encounter: Payer: Self-pay | Admitting: *Deleted

## 2014-05-18 VITALS — BP 140/80 | HR 88 | Ht 68.0 in | Wt 275.4 lb

## 2014-05-18 DIAGNOSIS — Z950 Presence of cardiac pacemaker: Secondary | ICD-10-CM

## 2014-05-18 LAB — MDC_IDC_ENUM_SESS_TYPE_INCLINIC
Battery Remaining Longevity: 99.6 mo
Battery Voltage: 3.02 V
Brady Statistic RA Percent Paced: 5.2 %
Brady Statistic RV Percent Paced: 97 %
Date Time Interrogation Session: 20160111122634
Implantable Pulse Generator Model: 3242
Implantable Pulse Generator Serial Number: 7701275
Lead Channel Impedance Value: 512.5 Ohm
Lead Channel Impedance Value: 637.5 Ohm
Lead Channel Impedance Value: 662.5 Ohm
Lead Channel Pacing Threshold Amplitude: 0.875 V
Lead Channel Pacing Threshold Amplitude: 0.875 V
Lead Channel Pacing Threshold Amplitude: 1 V
Lead Channel Pacing Threshold Pulse Width: 0.4 ms
Lead Channel Pacing Threshold Pulse Width: 0.4 ms
Lead Channel Pacing Threshold Pulse Width: 0.4 ms
Lead Channel Sensing Intrinsic Amplitude: 10.4 mV
Lead Channel Sensing Intrinsic Amplitude: 4.7 mV
Lead Channel Setting Pacing Amplitude: 1.875
Lead Channel Setting Pacing Amplitude: 2 V
Lead Channel Setting Pacing Amplitude: 2 V
Lead Channel Setting Pacing Pulse Width: 0.4 ms
Lead Channel Setting Pacing Pulse Width: 0.4 ms
Lead Channel Setting Sensing Sensitivity: 2 mV

## 2014-05-18 NOTE — Patient Instructions (Signed)
Increase your Carvedilol (Coreg) back to 6.25 mg twice a day   Decrease your Lisinopril to 20 mg 1/2 tablet  Ok to resume the Flomax 0.4 mg daily   Keep your appointment 05/29/14 with Dr Stanford Breed

## 2014-05-18 NOTE — Telephone Encounter (Signed)
Patient came into office not long after calling (before return call made to patient) and was seen by triage nurse.

## 2014-05-18 NOTE — Progress Notes (Signed)
Add-on per triage RN due to BP concern. Thresholds and sensing consistent with previous device measurements. Lead impedance trends stable over time. 791 mode switch episodes recorded. No ventricular arrhythmia episodes recorded. Patient bi-ventricularly pacing 97% of the time. Device programmed with appropriate safety margins. Audible alerts demonstrated for patient, pt knows to call clnic if heard. No changes made this session. Estimated longevity 8.3-9.32yrs. ROV as planned---w/ SK 07/31/14.

## 2014-05-18 NOTE — Telephone Encounter (Signed)
F/U         Pt wife calling, states pt hasn't had a good weekend, has not been able to sleep and BP is unstable. At times high and others low.  Wife wants to bring pt in today, Please call return call.

## 2014-05-18 NOTE — Progress Notes (Signed)
1.) Reason for visit: patient walked in  2.) Name of MD requesting visit: Klein/Crenshaw  3.) H&P: Patient had BiV PPM St Jude placement 04/29/14. Patient was seen 05/13/14 by device clinic for wound check and interrogation. Patient was placed on Coreg 6.26 mg twice a day for elevated heart rate. Patient seen by PCP last week and Coreg decrease to 3.125 mg twice a day secondary to hypotension. Patient presents today with elevated blood pressure over the weekend and feeling like heart racing. Blood pressure today 140/80 and heart rate 88. Patients medications reviewed and patient did not have Lisinopril on medication list. Patient has been taking Lisinopril 20 mg daily. Patient also has episodes of diarrhea that Dr Watt Climes is following. Prior to ov with PCP he had been having diarrhea and wife stated he does not stay well hydrated during these times. Discussed issues with Tera Helper NP. Patient will have another interrogation today, increase Coreg back to 6.25 mg and decrease Lisinopril to 20 mg 1/2 tablet daily. Ok to resume Flomax and stay hydrated. All of these instructions were given to patient and his wife. Agreeable to plan. Keep appointment with Dr Stanford Breed on 05/29/14. If systolic blood pressure drops below 100 call the office.

## 2014-05-20 ENCOUNTER — Other Ambulatory Visit (HOSPITAL_BASED_OUTPATIENT_CLINIC_OR_DEPARTMENT_OTHER): Payer: 59 | Admitting: Lab

## 2014-05-20 ENCOUNTER — Ambulatory Visit (HOSPITAL_BASED_OUTPATIENT_CLINIC_OR_DEPARTMENT_OTHER): Payer: 59 | Admitting: Hematology & Oncology

## 2014-05-20 ENCOUNTER — Encounter: Payer: Self-pay | Admitting: Hematology & Oncology

## 2014-05-20 ENCOUNTER — Ambulatory Visit: Payer: 59

## 2014-05-20 VITALS — BP 140/69 | HR 96 | Temp 97.5°F | Resp 20 | Ht 68.0 in | Wt 276.0 lb

## 2014-05-20 DIAGNOSIS — D45 Polycythemia vera: Secondary | ICD-10-CM

## 2014-05-20 DIAGNOSIS — M1000 Idiopathic gout, unspecified site: Secondary | ICD-10-CM

## 2014-05-20 DIAGNOSIS — G4701 Insomnia due to medical condition: Secondary | ICD-10-CM

## 2014-05-20 DIAGNOSIS — G4700 Insomnia, unspecified: Secondary | ICD-10-CM

## 2014-05-20 DIAGNOSIS — F410 Panic disorder [episodic paroxysmal anxiety] without agoraphobia: Secondary | ICD-10-CM

## 2014-05-20 DIAGNOSIS — M79604 Pain in right leg: Secondary | ICD-10-CM

## 2014-05-20 DIAGNOSIS — M545 Low back pain: Secondary | ICD-10-CM

## 2014-05-20 LAB — CBC WITH DIFFERENTIAL (CANCER CENTER ONLY)
BASO#: 0.2 10*3/uL (ref 0.0–0.2)
BASO%: 2.1 % — ABNORMAL HIGH (ref 0.0–2.0)
EOS%: 3.6 % (ref 0.0–7.0)
Eosinophils Absolute: 0.4 10*3/uL (ref 0.0–0.5)
HCT: 44.2 % (ref 38.7–49.9)
HGB: 13.7 g/dL (ref 13.0–17.1)
LYMPH#: 1.8 10*3/uL (ref 0.9–3.3)
LYMPH%: 19 % (ref 14.0–48.0)
MCH: 23.1 pg — ABNORMAL LOW (ref 28.0–33.4)
MCHC: 31 g/dL — ABNORMAL LOW (ref 32.0–35.9)
MCV: 75 fL — ABNORMAL LOW (ref 82–98)
MONO#: 0.8 10*3/uL (ref 0.1–0.9)
MONO%: 8.4 % (ref 0.0–13.0)
NEUT#: 6.5 10*3/uL (ref 1.5–6.5)
NEUT%: 66.9 % (ref 40.0–80.0)
Platelets: 591 10*3/uL — ABNORMAL HIGH (ref 145–400)
RBC: 5.92 10*6/uL — ABNORMAL HIGH (ref 4.20–5.70)
RDW: 18.5 % — ABNORMAL HIGH (ref 11.1–15.7)
WBC: 9.7 10*3/uL (ref 4.0–10.0)

## 2014-05-20 LAB — CMP (CANCER CENTER ONLY)
ALT(SGPT): 28 U/L (ref 10–47)
AST: 31 U/L (ref 11–38)
Albumin: 3.5 g/dL (ref 3.3–5.5)
Alkaline Phosphatase: 72 U/L (ref 26–84)
BUN, Bld: 20 mg/dL (ref 7–22)
CO2: 27 mEq/L (ref 18–33)
Calcium: 8.7 mg/dL (ref 8.0–10.3)
Chloride: 103 mEq/L (ref 98–108)
Creat: 1.2 mg/dl (ref 0.6–1.2)
Glucose, Bld: 134 mg/dL — ABNORMAL HIGH (ref 73–118)
Potassium: 4.5 mEq/L (ref 3.3–4.7)
Sodium: 142 mEq/L (ref 128–145)
Total Bilirubin: 0.7 mg/dl (ref 0.20–1.60)
Total Protein: 7 g/dL (ref 6.4–8.1)

## 2014-05-20 LAB — IRON AND TIBC CHCC
%SAT: 5 % — ABNORMAL LOW (ref 20–55)
Iron: 21 ug/dL — ABNORMAL LOW (ref 42–163)
TIBC: 411 ug/dL — ABNORMAL HIGH (ref 202–409)
UIBC: 390 ug/dL — ABNORMAL HIGH (ref 117–376)

## 2014-05-20 LAB — LACTATE DEHYDROGENASE: LDH: 296 U/L — ABNORMAL HIGH (ref 94–250)

## 2014-05-20 LAB — RETICULOCYTES (CHCC)
ABS Retic: 65.2 10*3/uL (ref 19.0–186.0)
RBC.: 5.93 MIL/uL — ABNORMAL HIGH (ref 4.22–5.81)
Retic Ct Pct: 1.1 % (ref 0.4–2.3)

## 2014-05-20 LAB — FERRITIN CHCC: Ferritin: 20 ng/ml — ABNORMAL LOW (ref 22–316)

## 2014-05-20 LAB — CHCC SATELLITE - SMEAR

## 2014-05-20 MED ORDER — FEBUXOSTAT 40 MG PO TABS: 40.0000 mg | ORAL_TABLET | Freq: Every day | ORAL | Status: DC

## 2014-05-20 NOTE — Progress Notes (Signed)
No phlebotomy today per Dr Antonieta Pert order.

## 2014-05-21 ENCOUNTER — Telehealth: Payer: Self-pay | Admitting: Hematology & Oncology

## 2014-05-21 NOTE — Progress Notes (Signed)
Hematology and Oncology Follow Up Visit  William Villanueva [id number] 1950/10/20 64 y.o. 05/21/2014   Principle Diagnosis:   Polycythemia vera-JAK2 positive  Heart block-Mobitz II  Current Therapy:    Anagrelide 1 mg by mouth daily alternating with 2 mg a day  Aspirin 81 mg by mouth daily  Phlebotomy to maintain hematocrit below 45%     Interim History:  [name] is back for followup. Surprisingly enough, he required a pacemaker to be placed.William Villanueva He's having problems with being tired. He has some presyncopal episodes. This was right before Christmas. He is found to be in heart block. He had a Mobitz2 AVB with a heart rate in the 40s. He had a pacemaker placed successfully. He does feel better. He is not getting as tired.  His back seems to be doing a little better. He's by lost about 30 pounds since his back surgery.  He's had no nausea vomiting. He's had no cough. He's had no leg swelling.  He continues on anagrelide. He's doing well with this.  There is no pain in his hands or feet. He's had no erythema in his hands or feet.   He is trying to exercise. He is walking a little bit more.   His last phlebotomy was back in September.  He stopped his Restoril a couple weeks ago because he was sleeping better. Now, he is not sleeping as well. I told him to restart the Restoril.   Medications:  Current outpatient prescriptions:  .  anagrelide (AGRYLIN) 1 MG capsule, Take 2 capsules a day alternating with 1 capsule a day., Disp: 60 capsule, Rfl: 6 .  aspirin 81 MG tablet, Take 81 mg by mouth daily., Disp: , Rfl:  .  carvedilol (COREG) 6.25 MG tablet, Take 1 tablet (6.25 mg total) by mouth 2 (two) times daily., Disp: 180 tablet, Rfl: 3 .  COLCRYS 0.6 MG tablet, Take 0.6 mg by mouth as needed. , Disp: , Rfl:  .  docusate sodium (COLACE) 100 MG capsule, Take 100 mg by mouth as needed. , Disp: , Rfl:  .  febuxostat (ULORIC) 40 MG tablet, Take 1 tablet (40 mg total) by mouth daily.,  Disp: 30 tablet, Rfl: 6 .  Lactobacillus (PROBIATA PO), Take by mouth every morning., Disp: , Rfl:  .  lisinopril (PRINIVIL,ZESTRIL) 20 MG tablet, Take 20 mg by mouth as directed. 1/2 tablet daily, Disp: , Rfl:  .  omeprazole (PRILOSEC) 20 MG capsule, Take 20 mg by mouth daily., Disp: , Rfl:  .  oxymorphone (OPANA) 5 MG tablet, Take 10 mg by mouth every 4 (four) hours as needed for pain. , Disp: , Rfl:  .  tamsulosin (FLOMAX) 0.4 MG CAPS, Take 0.4 mg by mouth as needed. , Disp: , Rfl:  .  temazepam (RESTORIL) 22.5 MG capsule, Take 1 capsule (22.5 mg total) by mouth at bedtime as needed for sleep., Disp: 30 capsule, Rfl: 2 No current facility-administered medications for this visit.  Facility-Administered Medications Ordered in Other Visits:  .  0.9 %  sodium chloride infusion, , Intravenous, Continuous, Volanda Napoleon, MD, Stopped at 05/16/13 1115  Allergies:  Allergies  Allergen Reactions  . Prednisone Other (See Comments)    Abdominal pain  . Wellbutrin [Bupropion] Hives    Past Medical History, Surgical history, Social history, and Family History were reviewed and updated.  Review of Systems: As above  Physical Exam:  height is 5' 8"  (1.727 m) and weight is 276 lb (125.193 kg). His  oral temperature is 97.5 F (36.4 C). His blood pressure is 140/69 and his pulse is 96. His respiration is 20.   Obese white male in no obvious distress. There is no adenopathy in the neck. Lungs are clear. Cardiac exam regular rate and rhythm. He's an occasional extra beat. He has no murmurs, rubs or bruits. Abdomen is obese but soft. Has good bowel sounds. There is no fluid wave. There is a palpable liver or spleen tip. Back exam shows a well healed laminectomy scar in the lumbar spine. Extremities shows no clubbing cyanosis or edema. He has good range of motion of his joints. He has good strength. Skin exam no rashes, ecchymoses or petechia. Neurological exam is nonfocal.  Lab Results  Component  Value Date   WBC 9.7 05/20/2014   HGB 13.7 05/20/2014   HCT 44.2 05/20/2014   MCV 75* 05/20/2014   PLT 591* 05/20/2014     Chemistry      Component Value Date/Time   NA 142 05/20/2014 0810   NA 137 04/29/2014 0747   K 4.5 05/20/2014 0810   K 4.2 04/29/2014 0747   CL 103 05/20/2014 0810   CL 104 04/29/2014 0747   CO2 27 05/20/2014 0810   CO2 25 04/29/2014 0747   BUN 20 05/20/2014 0810   BUN 13 04/29/2014 0747   CREATININE 1.2 05/20/2014 0810   CREATININE 1.39* 04/29/2014 0747      Component Value Date/Time   CALCIUM 8.7 05/20/2014 0810   CALCIUM 8.8 04/29/2014 0747   ALKPHOS 72 05/20/2014 0810   ALKPHOS 79 04/28/2014 1026   AST 31 05/20/2014 0810   AST 27 04/28/2014 1026   ALT 28 05/20/2014 0810   ALT 26 04/28/2014 1026   BILITOT 0.70 05/20/2014 0810   BILITOT 0.7 04/28/2014 1026         Impression and Plan: William Villanueva is 64 year old gentleman with polycythemia vera. He does not need be phlebotomized today.  His platelet count is holding steady.. I will not adjust his anagrelide dose . We can certainly make adjustments down the road.  I'm glad that this pacemaker was put in. Hopefully, this will give him more energy so that he can lose more weight to help his back.  I spent about 30 minutes with he and his wife today. I just was try to find out what exactly happened with his heart. I had to go through all the records that we have on him.  I will plan to get him back to see him in another 4-6 weeks. Volanda Napoleon, MD 1/14/20167:18 AM

## 2014-05-21 NOTE — Telephone Encounter (Signed)
[employer] has APPROVED the [facility]  and is good until 05/21/2019.     File ID: [id number]      COPY SCANNED

## 2014-05-22 ENCOUNTER — Other Ambulatory Visit: Payer: Self-pay | Admitting: Nurse Practitioner

## 2014-05-22 MED ORDER — TAMSULOSIN HCL 0.4 MG PO CAPS: 0.4000 mg | ORAL_CAPSULE | ORAL | Status: DC | PRN

## 2014-05-26 NOTE — Progress Notes (Signed)
HPI: FU cardiomyopathy. Echocardiogram in August of 2013 showed an ejection fraction of 35-40%. There was diffuse hypokinesis. Moderate left ventricular hypertrophy. The left atrium was mild to moderately dilated. Exercise treadmill in August of 2013 was negative. Cardiac catheterization in September of 2013 showed mild nonobstructive coronary disease. There was no hemodynamic evidence of restriction. Pulmonary capillary wedge pressure 10. Cardiac MRI in September of 2013 showed an ejection fraction of 34% with diffuse hypokinesis. There was no hyperenhancement or scar tissue and no evidence of cardiac hemochromatosis. There was moderate left atrial enlargement. TSH in September 2013 normal. Echo repeated in Dec 2015 and showed EF 45, moderate LAE. Developed high degree AV block 12/15 and had ICD placed. Since I last saw him,  Current Outpatient Prescriptions  Medication Sig Dispense Refill  . anagrelide (AGRYLIN) 1 MG capsule Take 2 capsules a day alternating with 1 capsule a day. 60 capsule 6  . aspirin 81 MG tablet Take 81 mg by mouth daily.    . carvedilol (COREG) 6.25 MG tablet Take 1 tablet (6.25 mg total) by mouth 2 (two) times daily. 180 tablet 3  . COLCRYS 0.6 MG tablet Take 0.6 mg by mouth as needed.     . docusate sodium (COLACE) 100 MG capsule Take 100 mg by mouth as needed.     . febuxostat (ULORIC) 40 MG tablet Take 1 tablet (40 mg total) by mouth daily. 30 tablet 6  . Lactobacillus (PROBIATA PO) Take by mouth every morning.    [name] lisinopril (PRINIVIL,ZESTRIL) 20 MG tablet Take 20 mg by mouth as directed. 1/2 tablet daily    . omeprazole (PRILOSEC) 20 MG capsule Take 20 mg by mouth daily.    [name] oxymorphone (OPANA) 5 MG tablet Take 10 mg by mouth every 4 (four) hours as needed for pain.     . tamsulosin (FLOMAX) 0.4 MG CAPS capsule Take 1 capsule (0.4 mg total) by mouth as needed. 30 capsule 6  . temazepam (RESTORIL) 22.5 MG capsule Take 1 capsule (22.5 mg total) by mouth at  bedtime as needed for sleep. 30 capsule 2   No current facility-administered medications for this visit.   Facility-Administered Medications Ordered in Other Visits  Medication Dose Route Frequency Provider Last Rate Last Dose  . 0.9 %  sodium chloride infusion   Intravenous Continuous Volanda Napoleon, MD   Stopped at 05/16/13 1115     Past Medical History  Diagnosis Date  . Polycythemia vera(238.4)   . Back pain   . Hemochromatosis   . Gout   . Hypertension   . Nephrolithiasis   . Cardiomyopathy     Nonischemic 45%.   [name] Hypospadias 03/27/1951    born with    Past Surgical History  Procedure Laterality Date  . Ankle surgery    . Pilonidal cyst excision    . Tonsillectomy    . Bi-ventricular pacemaker insertion N/A 04/29/2014    Procedure: BI-VENTRICULAR PACEMAKER INSERTION (CRT-P);  Surgeon: Deboraha Sprang, MD;  Location: [facility] CATH LAB;  Service: Cardiovascular;  Laterality: N/A;    History   Social History  . Marital Status: Married    Spouse Name: N/A    Number of Children: N/A  . Years of Education: N/A   Occupational History  . Not on file.   Social History Main Topics  . Smoking status: Former Smoker -- 1.00 packs/day for 44 years    Types: Cigarettes    Start date: 06/05/1968  Quit date: 04/05/2013  . Smokeless tobacco: Never Used     Comment: quit October 2014  . Alcohol Use: Yes     Comment: Occasional  . Drug Use: No  . Sexual Activity: Not on file   Other Topics Concern  . Not on file   Social History Narrative   Lives at home with wife.     ROS: no fevers or chills, productive cough, hemoptysis, dysphasia, odynophagia, melena, hematochezia, dysuria, hematuria, rash, seizure activity, orthopnea, PND, pedal edema, claudication. Remaining systems are negative.  Physical Exam: Well-developed well-nourished in no acute distress.  Skin is warm and dry.  HEENT is normal.  Neck is supple.  Chest is clear to auscultation with normal expansion.    Cardiovascular exam is regular rate and rhythm.  Abdominal exam nontender or distended. No masses palpated. Extremities show no edema. neuro grossly intact  ECG     This encounter was created in error - please disregard.

## 2014-05-27 ENCOUNTER — Telehealth: Payer: Self-pay | Admitting: Hematology & Oncology

## 2014-05-27 NOTE — Telephone Encounter (Signed)
Patient: William Fosdick., DOB: 1950/09/22  Drug: Uloric 40MG tablets  PA created date: 05/20/2014  Outcome: Approved

## 2014-05-28 DIAGNOSIS — M7918 Myalgia, other site: Secondary | ICD-10-CM | POA: Insufficient documentation

## 2014-05-29 ENCOUNTER — Encounter: Payer: Self-pay | Admitting: Cardiology

## 2014-06-04 ENCOUNTER — Encounter: Payer: Self-pay | Admitting: Internal Medicine

## 2014-06-08 ENCOUNTER — Encounter: Payer: Self-pay | Admitting: Internal Medicine

## 2014-06-10 NOTE — Progress Notes (Signed)
HPI: FU cardiomyopathy. Echocardiogram in August of 2013 showed an ejection fraction of 35-40%. There was diffuse hypokinesis. Moderate left ventricular hypertrophy. The left atrium was mild to moderately dilated. Exercise treadmill in August of 2013 was negative. Cardiac catheterization in September of 2013 showed mild nonobstructive coronary disease. There was no hemodynamic evidence of restriction. Pulmonary capillary wedge pressure 10. Cardiac MRI in September of 2013 showed an ejection fraction of 34% with diffuse hypokinesis. There was no hyperenhancement or scar tissue and no evidence of cardiac hemochromatosis. There was moderate left atrial enlargement. TSH in September 2013 normal. Last echocardiogram in December 2015 showed an ejection fraction of 45%, moderate left atrial enlargement. 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 Patient subsequently had CRTP placed. Since last seen, mild dyspnea on exertion but no orthopnea, PND, pedal edema or exertional chest pain. No syncope but does have some dizziness with standing. Since having his pacemaker placed he feels occasional palpitations described as heart racing for 30 seconds.  Current Outpatient Prescriptions  Medication Sig Dispense Refill  . anagrelide (AGRYLIN) 1 MG capsule Take 2 capsules a day alternating with 1 capsule a day. 60 capsule 6  . aspirin 81 MG tablet Take 81 mg by mouth daily.    . carvedilol (COREG) 6.25 MG tablet Take 1 tablet (6.25 mg total) by mouth 2 (two) times daily. 180 tablet 3  . COLCRYS 0.6 MG tablet Take 0.6 mg by mouth as needed.     . docusate sodium (COLACE) 100 MG capsule Take 100 mg by mouth as needed.     . febuxostat (ULORIC) 40 MG tablet Take 1 tablet (40 mg total) by mouth daily. 30 tablet 6  . Lactobacillus (PROBIATA PO) Take by mouth every morning.    [name] lisinopril (PRINIVIL,ZESTRIL) 20 MG tablet Take 20 mg by mouth as directed. 1/2 tablet daily    . omeprazole (PRILOSEC) 20  MG capsule Take 20 mg by mouth daily.    [name] oxymorphone (OPANA) 5 MG tablet Take 10 mg by mouth every 4 (four) hours as needed for pain.     . tamsulosin (FLOMAX) 0.4 MG CAPS capsule Take 1 capsule (0.4 mg total) by mouth as needed. 30 capsule 6  . temazepam (RESTORIL) 22.5 MG capsule Take 1 capsule (22.5 mg total) by mouth at bedtime as needed for sleep. 30 capsule 2   No current facility-administered medications for this visit.   Facility-Administered Medications Ordered in Other Visits  Medication Dose Route Frequency Provider Last Rate Last Dose  . 0.9 %  sodium chloride infusion   Intravenous Continuous Volanda Napoleon, MD   Stopped at 05/16/13 1115     Past Medical History  Diagnosis Date  . Polycythemia vera(238.4)   . Back pain   . Hemochromatosis   . Gout   . Hypertension   . Nephrolithiasis   . Cardiomyopathy     Nonischemic 45%.   [name] Hypospadias March 02, 1951    born with    Past Surgical History  Procedure Laterality Date  . Ankle surgery    . Pilonidal cyst excision    . Tonsillectomy    . Bi-ventricular pacemaker insertion N/A 04/29/2014    Procedure: BI-VENTRICULAR PACEMAKER INSERTION (CRT-P);  Surgeon: Deboraha Sprang, MD;  Location: [facility] CATH LAB;  Service: Cardiovascular;  Laterality: N/A;    History   Social History  . Marital Status: Married    Spouse Name: N/A    Number of Children: N/A  .  Years of Education: N/A   Occupational History  . Not on file.   Social History Main Topics  . Smoking status: Former Smoker -- 1.00 packs/day for 44 years    Types: Cigarettes    Start date: 06/05/1968    Quit date: 04/05/2013  . Smokeless tobacco: Never Used     Comment: quit October 2014  . Alcohol Use: Yes     Comment: Occasional  . Drug Use: No  . Sexual Activity: Not on file   Other Topics Concern  . Not on file   Social History Narrative   Lives at home with wife.     ROS: no fevers or chills, productive cough, hemoptysis, dysphasia, odynophagia,  melena, hematochezia, dysuria, hematuria, rash, seizure activity, orthopnea, PND, pedal edema, claudication. Remaining systems are negative.  Physical Exam: Well-developed well-nourished in no acute distress.  Skin is warm and dry.  HEENT is normal.  Neck is supple.  Chest is clear to auscultation with normal expansion. Pacemaker site without evidence of infection and no hematoma. Cardiovascular exam is regular rate and rhythm.  Abdominal exam nontender or distended. No masses palpated. Extremities show no edema. neuro grossly intact  ECG sinus rhythm at a rate of 82. Right axis deviation. Incomplete right bundle branch block. Inferior lateral T-wave inversion.

## 2014-06-11 ENCOUNTER — Ambulatory Visit (INDEPENDENT_AMBULATORY_CARE_PROVIDER_SITE_OTHER): Payer: 59 | Admitting: Cardiology

## 2014-06-11 ENCOUNTER — Telehealth: Payer: Self-pay | Admitting: *Deleted

## 2014-06-11 ENCOUNTER — Encounter: Payer: Self-pay | Admitting: Cardiology

## 2014-06-11 ENCOUNTER — Ambulatory Visit (INDEPENDENT_AMBULATORY_CARE_PROVIDER_SITE_OTHER): Payer: 59 | Admitting: *Deleted

## 2014-06-11 VITALS — BP 142/80 | HR 82 | Ht 70.0 in | Wt 278.6 lb

## 2014-06-11 DIAGNOSIS — R002 Palpitations: Secondary | ICD-10-CM

## 2014-06-11 DIAGNOSIS — R001 Bradycardia, unspecified: Secondary | ICD-10-CM

## 2014-06-11 DIAGNOSIS — I420 Dilated cardiomyopathy: Secondary | ICD-10-CM

## 2014-06-11 DIAGNOSIS — Z950 Presence of cardiac pacemaker: Secondary | ICD-10-CM

## 2014-06-11 DIAGNOSIS — I429 Cardiomyopathy, unspecified: Secondary | ICD-10-CM

## 2014-06-11 LAB — MDC_IDC_ENUM_SESS_TYPE_INCLINIC
Battery Remaining Longevity: 100.8 mo
Battery Voltage: 3.01 V
Brady Statistic RA Percent Paced: 1.3 %
Brady Statistic RV Percent Paced: 98 %
Date Time Interrogation Session: 20160204154050
Implantable Pulse Generator Model: 3242
Implantable Pulse Generator Serial Number: 7701275
Lead Channel Impedance Value: 462.5 Ohm
Lead Channel Impedance Value: 600 Ohm
Lead Channel Impedance Value: 612.5 Ohm
Lead Channel Pacing Threshold Amplitude: 0.75 V
Lead Channel Pacing Threshold Amplitude: 0.75 V
Lead Channel Pacing Threshold Amplitude: 0.875 V
Lead Channel Pacing Threshold Amplitude: 0.875 V
Lead Channel Pacing Threshold Pulse Width: 0.4 ms
Lead Channel Pacing Threshold Pulse Width: 0.4 ms
Lead Channel Pacing Threshold Pulse Width: 0.4 ms
Lead Channel Pacing Threshold Pulse Width: 0.4 ms
Lead Channel Sensing Intrinsic Amplitude: 12 mV
Lead Channel Sensing Intrinsic Amplitude: 4.8 mV
Lead Channel Setting Pacing Amplitude: 1.625
Lead Channel Setting Pacing Amplitude: 2 V
Lead Channel Setting Pacing Amplitude: 2 V
Lead Channel Setting Pacing Pulse Width: 0.4 ms
Lead Channel Setting Pacing Pulse Width: 0.4 ms
Lead Channel Setting Sensing Sensitivity: 2 mV

## 2014-06-11 MED ORDER — CARVEDILOL 12.5 MG PO TABS: 12.5000 mg | ORAL_TABLET | Freq: Two times a day (BID) | ORAL | Status: DC

## 2014-06-11 MED ORDER — LISINOPRIL 5 MG PO TABS: 5.0000 mg | ORAL_TABLET | Freq: Every day | ORAL | Status: DC

## 2014-06-11 NOTE — Assessment & Plan Note (Signed)
Pacemaker site without evidence of infection. Follow-up electrophysiology.

## 2014-06-11 NOTE — Assessment & Plan Note (Signed)
Etiology unclear. We will have his device interrogated. Consider monitoring the future if symptoms persist.

## 2014-06-11 NOTE — Progress Notes (Signed)
Add-on per Dr. Stanford Breed. Pt having symptomatic tachycardia averaging 30sec per pt. 1,419 episodes--- <1% of time. EGMs show longest episode 25min24sec; all other episodes under 16sec. Presenting EGM PAC morphology matches some atrial-tach episodes. Most Atrial episodic rates 152-190bpm, 1 episode shows peak-A 240bpm. CorVue abnormal since 05/22/14.

## 2014-06-11 NOTE — Telephone Encounter (Signed)
Per device check the patient is having freq episodes of atrial tach. Per dr Stanford Breed the patient will decrease lisinopril to 5 mg daily. He will increase carvedilol to 12.5 mg twice daily New scripts sent to the pharmacy. He is to call if he cont to have symptoms after increasing his medicine. Patient voiced understanding of medication changes.

## 2014-06-11 NOTE — Assessment & Plan Note (Signed)
Continue beta blocker and ACE inhibitor. I will not advance these medications as he is complaining of occasional orthostatic symptoms.

## 2014-06-11 NOTE — Patient Instructions (Signed)
Your physician wants you to follow-up in: 6 MONTHS WITH DR CRENSHAW You will receive a reminder letter in the mail two months in advance. If you don't receive a letter, please call our office to schedule the follow-up appointment.  

## 2014-06-12 ENCOUNTER — Telehealth: Payer: Self-pay | Admitting: Cardiology

## 2014-06-12 MED ORDER — LISINOPRIL 5 MG PO TABS: 5.0000 mg | ORAL_TABLET | Freq: Every day | ORAL | Status: DC

## 2014-06-12 NOTE — Telephone Encounter (Signed)
Returned call to Ida at [facility].Lisinopril 5 mg daily.

## 2014-06-12 NOTE — Telephone Encounter (Signed)
Tashawn called in wanting to clarify some directions for the pt's Lisinopril 3m. Please call  Thanks

## 2014-06-15 DIAGNOSIS — K529 Noninfective gastroenteritis and colitis, unspecified: Secondary | ICD-10-CM | POA: Insufficient documentation

## 2014-06-16 ENCOUNTER — Telehealth: Payer: Self-pay | Admitting: Cardiology

## 2014-06-16 NOTE — Telephone Encounter (Signed)
Spoke with pt, this morning he woke SOB and his heart was racing. It lasted about 5-10 min went away and then came back. Right now he is not SOB but does not feel right, and he is not sure why. His bp was fine during the episodes, the highest he saw his heart rate was 100. He denies edema, weight gain or orthopnea last night prior to going to bed. He denies chest pain. The only difference last night was he did not take his sleeping pill last night. Will forward for dr Stanford Breed review

## 2014-06-16 NOTE — Telephone Encounter (Signed)
Discussed with device tech, patient was instructed to send a transmission from home.

## 2014-06-16 NOTE — Telephone Encounter (Signed)
Please have device interrogated again to see what rhythm disturbance is. Change coreg to 18.75 BID. Kirk Ruths

## 2014-06-16 NOTE — Telephone Encounter (Signed)
Spoke with William Villanueva, device tech, per dr Caryl Comes the patient is not having any atrial fib. He cont to have freq atrial tach. Dr Stanford Breed made aware. Patient aware

## 2014-06-16 NOTE — Telephone Encounter (Signed)
Please call asap,he is having problems with breathing. It is not all the time,it it is off and on.

## 2014-06-17 ENCOUNTER — Encounter: Payer: Self-pay | Admitting: Family

## 2014-06-17 ENCOUNTER — Ambulatory Visit (HOSPITAL_BASED_OUTPATIENT_CLINIC_OR_DEPARTMENT_OTHER): Payer: 59 | Admitting: Family

## 2014-06-17 ENCOUNTER — Ambulatory Visit: Payer: Self-pay

## 2014-06-17 ENCOUNTER — Other Ambulatory Visit (HOSPITAL_BASED_OUTPATIENT_CLINIC_OR_DEPARTMENT_OTHER): Payer: 59 | Admitting: Lab

## 2014-06-17 VITALS — BP 98/61 | HR 96 | Temp 97.7°F | Resp 20 | Ht 70.0 in | Wt 278.0 lb

## 2014-06-17 DIAGNOSIS — D45 Polycythemia vera: Secondary | ICD-10-CM

## 2014-06-17 DIAGNOSIS — F410 Panic disorder [episodic paroxysmal anxiety] without agoraphobia: Secondary | ICD-10-CM

## 2014-06-17 DIAGNOSIS — Z950 Presence of cardiac pacemaker: Secondary | ICD-10-CM

## 2014-06-17 DIAGNOSIS — M79604 Pain in right leg: Secondary | ICD-10-CM

## 2014-06-17 DIAGNOSIS — M545 Low back pain: Secondary | ICD-10-CM

## 2014-06-17 DIAGNOSIS — M1000 Idiopathic gout, unspecified site: Secondary | ICD-10-CM

## 2014-06-17 LAB — CBC WITH DIFFERENTIAL (CANCER CENTER ONLY)
BASO#: 0.3 10*3/uL — ABNORMAL HIGH (ref 0.0–0.2)
BASO%: 2.1 % — ABNORMAL HIGH (ref 0.0–2.0)
EOS%: 2.9 % (ref 0.0–7.0)
Eosinophils Absolute: 0.4 10*3/uL (ref 0.0–0.5)
HCT: 44.3 % (ref 38.7–49.9)
HGB: 13.7 g/dL (ref 13.0–17.1)
LYMPH#: 2.1 10*3/uL (ref 0.9–3.3)
LYMPH%: 16.6 % (ref 14.0–48.0)
MCH: 22.9 pg — ABNORMAL LOW (ref 28.0–33.4)
MCHC: 30.9 g/dL — ABNORMAL LOW (ref 32.0–35.9)
MCV: 74 fL — ABNORMAL LOW (ref 82–98)
MONO#: 1.1 10*3/uL — ABNORMAL HIGH (ref 0.1–0.9)
MONO%: 8.7 % (ref 0.0–13.0)
NEUT#: 8.8 10*3/uL — ABNORMAL HIGH (ref 1.5–6.5)
NEUT%: 69.7 % (ref 40.0–80.0)
Platelets: 705 10*3/uL — ABNORMAL HIGH (ref 145–400)
RBC: 5.97 10*6/uL — ABNORMAL HIGH (ref 4.20–5.70)
RDW: 19.3 % — ABNORMAL HIGH (ref 11.1–15.7)
WBC: 12.6 10*3/uL — ABNORMAL HIGH (ref 4.0–10.0)

## 2014-06-17 LAB — CMP (CANCER CENTER ONLY)
ALT(SGPT): 20 U/L (ref 10–47)
AST: 28 U/L (ref 11–38)
Albumin: 3.5 g/dL (ref 3.3–5.5)
Alkaline Phosphatase: 74 U/L (ref 26–84)
BUN, Bld: 17 mg/dL (ref 7–22)
CO2: 25 mEq/L (ref 18–33)
Calcium: 8.8 mg/dL (ref 8.0–10.3)
Chloride: 108 mEq/L (ref 98–108)
Creat: 1.2 mg/dl (ref 0.6–1.2)
Glucose, Bld: 149 mg/dL — ABNORMAL HIGH (ref 73–118)
Potassium: 4.2 mEq/L (ref 3.3–4.7)
Sodium: 141 mEq/L (ref 128–145)
Total Bilirubin: 0.7 mg/dl (ref 0.20–1.60)
Total Protein: 7 g/dL (ref 6.4–8.1)

## 2014-06-17 LAB — LACTATE DEHYDROGENASE: LDH: 273 U/L — ABNORMAL HIGH (ref 94–250)

## 2014-06-17 NOTE — Progress Notes (Signed)
[place]  [phone]) [phone] [phone]) [phone]  ID: William Villanueva OB: [birth year] MR#: [id number] Patient Care Team: Gara Kroner, MD as PCP - General (Family Medicine)  DIAGNOSIS: Polycythemia vera-JAK2 positive Heart block-Mobitz II  INTERVAL HISTORY: William Villanueva is here today for a follow-up. He is doing well with his pacemaker and has not had any more syncopal episodes. He is having SOB in the mornings when he gets up and a cough for the first few hours.  He denies fever, chills, n/v, cough, rash, dizziness, chest pain, palpitations, abdominal pain, constipation, blood in urine or stool. He has diarrhea at times.  No swelling, tenderness, numbness or tingling in her extremities.  His appetite is good and he is drinking plenty of fluids.  He is still trying be more active and exercise.  His last phlebotomy was in September 2015.   CURRENT TREATMENT: Anagrelide 1 mg by mouth daily alternating with 2 mg a day Aspirin 81 mg by mouth daily Phlebotomy to maintain hematocrit below 45%  REVIEW OF SYSTEMS: All other 10 point review of systems is negative.   PAST MEDICAL HISTORY: Past Medical History  Diagnosis Date  . Polycythemia vera(238.4)   . Back pain   . Hemochromatosis   . Gout   . Hypertension   . Nephrolithiasis   . Cardiomyopathy     Nonischemic 45%.   [name] Hypospadias Jun 06, 1950    born with    PAST SURGICAL HISTORY: Past Surgical History  Procedure Laterality Date  . Ankle surgery    . Pilonidal cyst excision    . Tonsillectomy    . Bi-ventricular pacemaker insertion N/A 04/29/2014    Procedure: BI-VENTRICULAR PACEMAKER INSERTION (CRT-P);  Surgeon: Deboraha Sprang, MD;  Location: [facility] CATH LAB;  Service: Cardiovascular;  Laterality: N/A;    FAMILY HISTORY Family History  Problem Relation Age of Onset  . Heart disease Maternal Grandmother     Pacemaker, MI    GYNECOLOGIC HISTORY:  No LMP for male patient.   SOCIAL  HISTORY: History   Social History  . Marital Status: Married    Spouse Name: N/A  . Number of Children: N/A  . Years of Education: N/A   Occupational History  . Not on file.   Social History Main Topics  . Smoking status: Former Smoker -- 1.00 packs/day for 44 years    Types: Cigarettes    Start date: 06/05/1968    Quit date: 04/05/2013  . Smokeless tobacco: Never Used     Comment: quit October 2014  . Alcohol Use: 0.0 oz/week    0 Standard drinks or equivalent per week     Comment: Occasional  . Drug Use: No  . Sexual Activity: Not on file   Other Topics Concern  . Not on file   Social History Narrative   Lives at home with wife.     ADVANCED DIRECTIVES:  <no information>  HEALTH MAINTENANCE: History  Substance Use Topics  . Smoking status: Former Smoker -- 1.00 packs/day for 44 years    Types: Cigarettes    Start date: 06/05/1968    Quit date: 04/05/2013  . Smokeless tobacco: Never Used     Comment: quit October 2014  . Alcohol Use: 0.0 oz/week    0 Standard drinks or equivalent per week     Comment: Occasional   Colonoscopy: PAP: Bone density: Lipid panel:  Allergies  Allergen Reactions  . Prednisone Other (See Comments)    Abdominal pain  .  Wellbutrin [Bupropion] Hives    Current Outpatient Prescriptions  Medication Sig Dispense Refill  . anagrelide (AGRYLIN) 1 MG capsule Take 2 capsules a day alternating with 1 capsule a day. 60 capsule 6  . aspirin 81 MG tablet Take 81 mg by mouth daily.    . carvedilol (COREG) 12.5 MG tablet Take 1 tablet (12.5 mg total) by mouth 2 (two) times daily. (Patient taking differently: Take 12.5 mg by mouth 2 (two) times daily. INCREASE TO 18.5 MG) 60 tablet 12  . COLCRYS 0.6 MG tablet Take 0.6 mg by mouth as needed.     . docusate sodium (COLACE) 100 MG capsule Take 100 mg by mouth as needed.     . febuxostat (ULORIC) 40 MG tablet Take 1 tablet (40 mg total) by mouth daily. 30 tablet 6  . Lactobacillus (PROBIATA  PO) Take by mouth every morning.    [name] lisinopril (PRINIVIL,ZESTRIL) 5 MG tablet Take 1 tablet (5 mg total) by mouth daily. 30 tablet 6  . omeprazole (PRILOSEC) 20 MG capsule Take 20 mg by mouth daily. TAKES AS NEEDED    . oxymorphone (OPANA) 5 MG tablet Take 10 mg by mouth every 4 (four) hours as needed for pain. INCREASED TO 10 MG    . tamsulosin (FLOMAX) 0.4 MG CAPS capsule Take 1 capsule (0.4 mg total) by mouth as needed. 30 capsule 6  . temazepam (RESTORIL) 22.5 MG capsule Take 1 capsule (22.5 mg total) by mouth at bedtime as needed for sleep. 30 capsule 2   No current facility-administered medications for this visit.   Facility-Administered Medications Ordered in Other Visits  Medication Dose Route Frequency Provider Last Rate Last Dose  . 0.9 %  sodium chloride infusion   Intravenous Continuous Volanda Napoleon, MD   Stopped at 05/16/13 1115    OBJECTIVE: Filed Vitals:   06/17/14 0954  BP: 98/61  Pulse: 96  Temp: 97.7 F (36.5 C)  Resp: 20    Filed Weights   06/17/14 0954  Weight: 278 lb (126.1 kg)   ECOG FS:0 - Asymptomatic Ocular: Sclerae unicteric, pupils equal, round and reactive to light Ear-nose-throat: Oropharynx clear, dentition fair Lymphatic: No cervical or supraclavicular adenopathy Lungs no rales or rhonchi, good excursion bilaterally Heart regular rate and rhythm, no murmur appreciated Abd soft, nontender, positive bowel sounds MSK no focal spinal tenderness, no joint edema Neuro: non-focal, well-oriented, appropriate affect  LAB RESULTS: CMP     Component Value Date/Time   NA 142 05/20/2014 0810   NA 137 04/29/2014 0747   K 4.5 05/20/2014 0810   K 4.2 04/29/2014 0747   CL 103 05/20/2014 0810   CL 104 04/29/2014 0747   CO2 27 05/20/2014 0810   CO2 25 04/29/2014 0747   GLUCOSE 134* 05/20/2014 0810   GLUCOSE 116* 04/29/2014 0747   BUN 20 05/20/2014 0810   BUN 13 04/29/2014 0747   CREATININE 1.2 05/20/2014 0810   CREATININE 1.39* 04/29/2014 0747    CALCIUM 8.7 05/20/2014 0810   CALCIUM 8.8 04/29/2014 0747   PROT 7.0 05/20/2014 0810   PROT 7.1 04/28/2014 1026   ALBUMIN 3.6 04/28/2014 1026   AST 31 05/20/2014 0810   AST 27 04/28/2014 1026   ALT 28 05/20/2014 0810   ALT 26 04/28/2014 1026   ALKPHOS 72 05/20/2014 0810   ALKPHOS 79 04/28/2014 1026   BILITOT 0.70 05/20/2014 0810   BILITOT 0.7 04/28/2014 1026   GFRNONAA 53* 04/29/2014 0747   GFRAA 61* 04/29/2014 0747  INo results found for: SPEP, UPEP Lab Results  Component Value Date   WBC 12.6* 06/17/2014   NEUTROABS 8.8* 06/17/2014   HGB 13.7 06/17/2014   HCT 44.3 06/17/2014   MCV 74* 06/17/2014   PLT 705* 06/17/2014   No results found for: LABCA2 No components found for: ITGPQ982 No results for input(s): INR in the last 168 hours.  STUDIES: No results found.  ASSESSMENT/PLAN: William Villanueva is 64 year old gentleman with polycythemia vera. His last phlebotomy was in September 2015. His is doing better since having his pacemaker placed and is asymptomatic with the PV at this time.  His Hct is 44.3 so we will not be phlebotomizing him today.  We will see him back in 6 weeks for labs and follow-up.  He knows to call here with any questions or concerns and to go to the ED in the event of an emergency. We can certainly see him sooner if need be.   Eliezer Bottom, NP 06/17/2014 10:19 AM

## 2014-06-17 NOTE — Telephone Encounter (Signed)
Spoke with pt wife, the patient is feeling better today. The palpitations have settled down for now. Patient wife voiced understanding to call if cont to be a problem.

## 2014-06-21 ENCOUNTER — Encounter (HOSPITAL_COMMUNITY): Payer: Self-pay | Admitting: Emergency Medicine

## 2014-06-21 ENCOUNTER — Observation Stay (HOSPITAL_COMMUNITY)
Admission: EM | Admit: 2014-06-21 | Discharge: 2014-06-23 | Disposition: A | Discharge status: Home / Self Care | Payer: 59 | Attending: Internal Medicine | Admitting: Internal Medicine

## 2014-06-21 ENCOUNTER — Observation Stay (HOSPITAL_COMMUNITY): Payer: 59

## 2014-06-21 ENCOUNTER — Emergency Department (HOSPITAL_COMMUNITY): Payer: 59

## 2014-06-21 DIAGNOSIS — M549 Dorsalgia, unspecified: Secondary | ICD-10-CM | POA: Diagnosis present

## 2014-06-21 DIAGNOSIS — R079 Chest pain, unspecified: Principal | ICD-10-CM | POA: Insufficient documentation

## 2014-06-21 DIAGNOSIS — Z888 Allergy status to other drugs, medicaments and biological substances status: Secondary | ICD-10-CM | POA: Insufficient documentation

## 2014-06-21 DIAGNOSIS — Z87891 Personal history of nicotine dependence: Secondary | ICD-10-CM | POA: Insufficient documentation

## 2014-06-21 DIAGNOSIS — D45 Polycythemia vera: Secondary | ICD-10-CM | POA: Insufficient documentation

## 2014-06-21 DIAGNOSIS — Z7982 Long term (current) use of aspirin: Secondary | ICD-10-CM | POA: Diagnosis not present

## 2014-06-21 DIAGNOSIS — I429 Cardiomyopathy, unspecified: Secondary | ICD-10-CM

## 2014-06-21 DIAGNOSIS — M109 Gout, unspecified: Secondary | ICD-10-CM | POA: Insufficient documentation

## 2014-06-21 DIAGNOSIS — I420 Dilated cardiomyopathy: Secondary | ICD-10-CM | POA: Insufficient documentation

## 2014-06-21 DIAGNOSIS — I10 Essential (primary) hypertension: Secondary | ICD-10-CM | POA: Diagnosis not present

## 2014-06-21 DIAGNOSIS — R0602 Shortness of breath: Secondary | ICD-10-CM | POA: Insufficient documentation

## 2014-06-21 DIAGNOSIS — M545 Low back pain: Secondary | ICD-10-CM | POA: Diagnosis not present

## 2014-06-21 DIAGNOSIS — Z79899 Other long term (current) drug therapy: Secondary | ICD-10-CM | POA: Diagnosis not present

## 2014-06-21 DIAGNOSIS — R0789 Other chest pain: Secondary | ICD-10-CM | POA: Diagnosis present

## 2014-06-21 DIAGNOSIS — R0600 Dyspnea, unspecified: Secondary | ICD-10-CM | POA: Diagnosis present

## 2014-06-21 DIAGNOSIS — Z720 Tobacco use: Secondary | ICD-10-CM | POA: Diagnosis present

## 2014-06-21 DIAGNOSIS — Z950 Presence of cardiac pacemaker: Secondary | ICD-10-CM | POA: Diagnosis present

## 2014-06-21 DIAGNOSIS — R0609 Other forms of dyspnea: Secondary | ICD-10-CM

## 2014-06-21 DIAGNOSIS — G8929 Other chronic pain: Secondary | ICD-10-CM | POA: Diagnosis not present

## 2014-06-21 LAB — CBC
HCT: 43.6 % (ref 39.0–52.0)
HCT: 42.2 % (ref 39.0–52.0)
Hemoglobin: 13.4 g/dL (ref 13.0–17.0)
Hemoglobin: 12.6 g/dL — ABNORMAL LOW (ref 13.0–17.0)
MCH: 22.7 pg — ABNORMAL LOW (ref 26.0–34.0)
MCH: 22.7 pg — ABNORMAL LOW (ref 26.0–34.0)
MCHC: 30.7 g/dL (ref 30.0–36.0)
MCHC: 29.9 g/dL — ABNORMAL LOW (ref 30.0–36.0)
MCV: 73.9 fL — ABNORMAL LOW (ref 78.0–100.0)
MCV: 76 fL — ABNORMAL LOW (ref 78.0–100.0)
Platelets: 637 10*3/uL — ABNORMAL HIGH (ref 150–400)
Platelets: 631 10*3/uL — ABNORMAL HIGH (ref 150–400)
RBC: 5.9 MIL/uL — ABNORMAL HIGH (ref 4.22–5.81)
RBC: 5.55 MIL/uL (ref 4.22–5.81)
RDW: 17.8 % — ABNORMAL HIGH (ref 11.5–15.5)
RDW: 17.8 % — ABNORMAL HIGH (ref 11.5–15.5)
WBC: 13.3 10*3/uL — ABNORMAL HIGH (ref 4.0–10.5)
WBC: 12.6 10*3/uL — ABNORMAL HIGH (ref 4.0–10.5)

## 2014-06-21 LAB — BASIC METABOLIC PANEL
Anion gap: 9 (ref 5–15)
BUN: 20 mg/dL (ref 6–23)
CO2: 25 mmol/L (ref 19–32)
Calcium: 8.7 mg/dL (ref 8.4–10.5)
Chloride: 106 mmol/L (ref 96–112)
Creatinine, Ser: 1.51 mg/dL — ABNORMAL HIGH (ref 0.50–1.35)
GFR calc Af Amer: 55 mL/min — ABNORMAL LOW (ref >90)
GFR calc non Af Amer: 47 mL/min — ABNORMAL LOW (ref >90)
Glucose, Bld: 84 mg/dL (ref 70–99)
Potassium: 4.2 mmol/L (ref 3.5–5.1)
Sodium: 140 mmol/L (ref 135–145)

## 2014-06-21 LAB — BRAIN NATRIURETIC PEPTIDE: B Natriuretic Peptide: 225 pg/mL — ABNORMAL HIGH (ref 0.0–100.0)

## 2014-06-21 LAB — I-STAT TROPONIN, ED
Troponin i, poc: 0.01 ng/mL (ref 0.00–0.08)
Troponin i, poc: 0.02 ng/mL (ref 0.00–0.08)

## 2014-06-21 LAB — CREATININE, SERUM
Creatinine, Ser: 1.34 mg/dL (ref 0.50–1.35)
GFR calc Af Amer: 64 mL/min — ABNORMAL LOW (ref >90)
GFR calc non Af Amer: 55 mL/min — ABNORMAL LOW (ref >90)

## 2014-06-21 LAB — TROPONIN I
Troponin I: 0.03 ng/mL (ref <0.031)
Troponin I: 0.03 ng/mL (ref <0.031)

## 2014-06-21 MED ORDER — PANTOPRAZOLE SODIUM 40 MG PO TBEC
40.0000 mg | DELAYED_RELEASE_TABLET | Freq: Every day | ORAL | Status: DC
  Administered 2014-06-21 – 2014-06-23 (×3): 40 mg via ORAL
  Filled 2014-06-21 (×3): qty 1

## 2014-06-21 MED ORDER — CARVEDILOL 6.25 MG PO TABS
18.5000 mg | ORAL_TABLET | Freq: Two times a day (BID) | ORAL | Status: DC
  Administered 2014-06-21 – 2014-06-22 (×2): 18.75 mg via ORAL
  Filled 2014-06-21 (×4): qty 1

## 2014-06-21 MED ORDER — TAMSULOSIN HCL 0.4 MG PO CAPS
0.4000 mg | ORAL_CAPSULE | Freq: Every day | ORAL | Status: DC
  Administered 2014-06-21 – 2014-06-22 (×2): 0.4 mg via ORAL
  Filled 2014-06-21 (×4): qty 1

## 2014-06-21 MED ORDER — MORPHINE SULFATE 15 MG PO TABS
30.0000 mg | ORAL_TABLET | ORAL | Status: DC | PRN
Start: 2014-06-21 — End: 2014-06-23
  Administered 2014-06-22: 30 mg via ORAL
  Filled 2014-06-21: qty 2

## 2014-06-21 MED ORDER — FEBUXOSTAT 40 MG PO TABS
40.0000 mg | ORAL_TABLET | Freq: Every day | ORAL | Status: DC
  Administered 2014-06-22 – 2014-06-23 (×2): 40 mg via ORAL
  Filled 2014-06-21 (×2): qty 1

## 2014-06-21 MED ORDER — ANAGRELIDE HCL 1 MG PO CAPS
1.0000 mg | ORAL_CAPSULE | Freq: Every day | ORAL | Status: DC
  Administered 2014-06-22 – 2014-06-23 (×2): 1 mg via ORAL
  Filled 2014-06-21 (×2): qty 1

## 2014-06-21 MED ORDER — TEMAZEPAM 7.5 MG PO CAPS
22.5000 mg | ORAL_CAPSULE | Freq: Every day | ORAL | Status: DC
Start: 2014-06-21 — End: 2014-06-23
  Administered 2014-06-21 – 2014-06-22 (×2): 22.5 mg via ORAL
  Filled 2014-06-21 (×4): qty 1

## 2014-06-21 MED ORDER — ACETAMINOPHEN 325 MG PO TABS
650.0000 mg | ORAL_TABLET | ORAL | Status: DC | PRN
  Administered 2014-06-21: 650 mg via ORAL
  Filled 2014-06-21: qty 2

## 2014-06-21 MED ORDER — LISINOPRIL 5 MG PO TABS
5.0000 mg | ORAL_TABLET | Freq: Every day | ORAL | Status: DC
  Administered 2014-06-21 – 2014-06-22 (×2): 5 mg via ORAL
  Filled 2014-06-21 (×4): qty 1

## 2014-06-21 MED ORDER — IOHEXOL 350 MG/ML SOLN
100.0000 mL | Freq: Once | INTRAVENOUS | Status: AC | PRN
  Administered 2014-06-21: 100 mL via INTRAVENOUS

## 2014-06-21 MED ORDER — POLYVINYL ALCOHOL 1.4 % OP SOLN
1.0000 [drp] | OPHTHALMIC | Status: DC | PRN
  Filled 2014-06-21: qty 15

## 2014-06-21 MED ORDER — ASPIRIN 81 MG PO CHEW
324.0000 mg | CHEWABLE_TABLET | Freq: Once | ORAL | Status: AC
  Administered 2014-06-21: 324 mg via ORAL
  Filled 2014-06-21: qty 4

## 2014-06-21 MED ORDER — COLCHICINE 0.6 MG PO TABS
0.6000 mg | ORAL_TABLET | ORAL | Status: DC | PRN
  Administered 2014-06-22: 0.6 mg via ORAL
  Filled 2014-06-21 (×2): qty 1

## 2014-06-21 MED ORDER — ASPIRIN 300 MG RE SUPP: 300.0000 mg | RECTAL | Status: AC

## 2014-06-21 MED ORDER — SODIUM CHLORIDE 0.9 % IV SOLN
INTRAVENOUS | Status: DC
  Administered 2014-06-21 (×2): via INTRAVENOUS

## 2014-06-21 MED ORDER — DICYCLOMINE HCL 20 MG PO TABS
20.0000 mg | ORAL_TABLET | Freq: Two times a day (BID) | ORAL | Status: DC
  Administered 2014-06-21 – 2014-06-23 (×4): 20 mg via ORAL
  Filled 2014-06-21 (×5): qty 1

## 2014-06-21 MED ORDER — ASPIRIN EC 81 MG PO TBEC
81.0000 mg | DELAYED_RELEASE_TABLET | Freq: Every day | ORAL | Status: DC
  Administered 2014-06-22: 81 mg via ORAL
  Filled 2014-06-21 (×3): qty 1

## 2014-06-21 MED ORDER — SODIUM CHLORIDE 0.9 % IV BOLUS (SEPSIS)
250.0000 mL | Freq: Once | INTRAVENOUS | Status: AC
  Administered 2014-06-21: 250 mL via INTRAVENOUS

## 2014-06-21 MED ORDER — DOCUSATE SODIUM 100 MG PO CAPS: 100.0000 mg | ORAL_CAPSULE | ORAL | Status: DC | PRN

## 2014-06-21 MED ORDER — ASPIRIN 81 MG PO CHEW
324.0000 mg | CHEWABLE_TABLET | ORAL | Status: AC
  Filled 2014-06-21: qty 4

## 2014-06-21 MED ORDER — ENOXAPARIN SODIUM 40 MG/0.4ML ~~LOC~~ SOLN
40.0000 mg | SUBCUTANEOUS | Status: DC
  Administered 2014-06-21 – 2014-06-22 (×2): 40 mg via SUBCUTANEOUS
  Filled 2014-06-21 (×3): qty 0.4

## 2014-06-21 MED ORDER — NITROGLYCERIN 0.4 MG SL SUBL: 0.4000 mg | SUBLINGUAL_TABLET | SUBLINGUAL | Status: DC | PRN

## 2014-06-21 MED ORDER — ASPIRIN 81 MG PO TABS: 81.0000 mg | ORAL_TABLET | Freq: Every day | ORAL | Status: DC

## 2014-06-21 MED ORDER — ONDANSETRON HCL 4 MG/2ML IJ SOLN: 4.0000 mg | Freq: Four times a day (QID) | INTRAMUSCULAR | Status: DC | PRN

## 2014-06-21 NOTE — ED Notes (Signed)
Pt. Stood on side of bed to use urinal. Denies increasing SOB, chest pain or dizziness.

## 2014-06-21 NOTE — ED Notes (Signed)
Pt. Stated, i started having some chest pain on the right side this morning but I've had SOB for abou 2 days with some congestion.  I had a pacemaker put in on Dec. 23.

## 2014-06-21 NOTE — ED Provider Notes (Signed)
CSN: [id number]     Arrival date & time 06/21/14  9675 History   First MD Initiated Contact with Patient 06/21/14 [phone]     Chief Complaint  Patient presents with  . Chest Pain  . Shortness of Breath     (Consider location/radiation/quality/duration/timing/severity/associated sxs/prior Treatment) Patient is a 64 y.o. male presenting with chest pain and shortness of breath. The history is provided by the patient and the spouse.  Chest Pain Associated symptoms: back pain and shortness of breath   Associated symptoms: no abdominal pain, no fever, no nausea and not vomiting   Shortness of Breath Associated symptoms: chest pain and sore throat   Associated symptoms: no abdominal pain, no fever, no rash and no vomiting    patient with onset of shortness of breath this morning at 6. Followed by onset of substernal chest pain described as an ache at 6:30 in the morning. Patient room air sats here are 97%. Patient status post pacemaker placement for Mobitz type II heart block. This was done in December. Since that time patient has had some episodes of shortness of breath intermittently and cardiology has been increasing his Coreg. However patient has never had any chest pain.  Chest pain is nonradiating. No nausea no vomiting. Patient's had some diarrhea but that was been pre-existing. Patient has felt like he's got a developing upper respiratory infection with some congestion and mild sore throat since Thursday. No fevers.  Past Medical History  Diagnosis Date  . Polycythemia vera(238.4)   . Back pain   . Hemochromatosis   . Gout   . Hypertension   . Nephrolithiasis   . Cardiomyopathy     Nonischemic 45%.   [name] Hypospadias [birth year]    born with   Past Surgical History  Procedure Laterality Date  . Ankle surgery    . Pilonidal cyst excision    . Tonsillectomy    . Bi-ventricular pacemaker insertion N/A 04/29/2014    Procedure: BI-VENTRICULAR PACEMAKER INSERTION (CRT-P);  Surgeon:  Deboraha Sprang, MD;  Location: [facility] CATH LAB;  Service: Cardiovascular;  Laterality: N/A;   Family History  Problem Relation Age of Onset  . Heart disease Maternal Grandmother     Pacemaker, MI   History  Substance Use Topics  . Smoking status: Former Smoker -- 1.00 packs/day for 44 years    Types: Cigarettes    Start date: 06/05/1968    Quit date: 04/05/2013  . Smokeless tobacco: Never Used     Comment: quit October 2014  . Alcohol Use: 0.0 oz/week    0 Standard drinks or equivalent per week     Comment: Occasional    Review of Systems  Constitutional: Negative for fever.  HENT: Positive for congestion and sore throat.   Eyes: Negative for visual disturbance.  Respiratory: Positive for shortness of breath.   Cardiovascular: Positive for chest pain. Negative for leg swelling.  Gastrointestinal: Positive for diarrhea. Negative for nausea, vomiting and abdominal pain.  Genitourinary: Negative for dysuria.  Musculoskeletal: Positive for back pain.  Skin: Negative for rash.  Neurological: Positive for light-headedness.  Hematological: Does not bruise/bleed easily.  Psychiatric/Behavioral: Negative for confusion.      Allergies  Prednisone and Wellbutrin  Home Medications   Prior to Admission medications   Medication Sig Start Date End Date Taking? Authorizing Provider  anagrelide (AGRYLIN) 1 MG capsule Take 2 capsules a day alternating with 1 capsule a day. 03/18/14   Volanda Napoleon, MD  aspirin 81 MG tablet  Take 81 mg by mouth daily.    Historical Provider, MD  carvedilol (COREG) 12.5 MG tablet Take 1 tablet (12.5 mg total) by mouth 2 (two) times daily. Patient taking differently: Take 12.5 mg by mouth 2 (two) times daily. INCREASE TO 18.5 MG 06/11/14   Lelon Perla, MD  COLCRYS 0.6 MG tablet Take 0.6 mg by mouth as needed.  03/26/13   Historical Provider, MD  docusate sodium (COLACE) 100 MG capsule Take 100 mg by mouth as needed.     Historical Provider, MD   febuxostat (ULORIC) 40 MG tablet Take 1 tablet (40 mg total) by mouth daily. 05/20/14   Volanda Napoleon, MD  Lactobacillus (PROBIATA PO) Take by mouth every morning.    Historical Provider, MD  lisinopril (PRINIVIL,ZESTRIL) 5 MG tablet Take 1 tablet (5 mg total) by mouth daily. 06/12/14   Lelon Perla, MD  omeprazole (PRILOSEC) 20 MG capsule Take 20 mg by mouth daily. TAKES AS NEEDED    Historical Provider, MD  oxymorphone (OPANA) 5 MG tablet Take 10 mg by mouth every 4 (four) hours as needed for pain. INCREASED TO 10 MG    Historical Provider, MD  tamsulosin (FLOMAX) 0.4 MG CAPS capsule Take 1 capsule (0.4 mg total) by mouth as needed. 05/22/14   Volanda Napoleon, MD  temazepam (RESTORIL) 22.5 MG capsule Take 1 capsule (22.5 mg total) by mouth at bedtime as needed for sleep. 03/18/14   Volanda Napoleon, MD   BP 116/57 mmHg  Pulse 95  Temp(Src) 98.1 F (36.7 C) (Oral)  Resp 21  Ht 5' 10"  (1.778 m)  Wt 273 lb (123.832 kg)  BMI 39.17 kg/m2  SpO2 99% Physical Exam  Constitutional: He is oriented to person, place, and time. He appears well-developed and well-nourished. No distress.  HENT:  Head: Normocephalic and atraumatic.  Mouth/Throat: Oropharynx is clear and moist.  Eyes: Conjunctivae and EOM are normal. Pupils are equal, round, and reactive to light.  Neck: Normal range of motion.  Cardiovascular: Normal rate, regular rhythm and normal heart sounds.   No murmur heard. Pulmonary/Chest: Effort normal and breath sounds normal. No respiratory distress. He has no wheezes. He has no rales.  Abdominal: Soft. Bowel sounds are normal. There is no tenderness.  Musculoskeletal: Normal range of motion. He exhibits no edema.  Neurological: He is alert and oriented to person, place, and time. No cranial nerve deficit. He exhibits normal muscle tone. Coordination normal.  Skin: Skin is warm. No rash noted.  Nursing note and vitals reviewed.   ED Course  Procedures (including critical care  time) Labs Review Labs Reviewed  CBC  BRAIN NATRIURETIC PEPTIDE  COMPREHENSIVE METABOLIC PANEL  I-STAT TROPOININ, ED   Results for orders placed or performed during the hospital encounter of 06/21/14  CBC  Result Value Ref Range   WBC 13.3 (H) 4.0 - 10.5 K/uL   RBC 5.90 (H) 4.22 - 5.81 MIL/uL   Hemoglobin 13.4 13.0 - 17.0 g/dL   HCT 43.6 39.0 - 52.0 %   MCV 73.9 (L) 78.0 - 100.0 fL   MCH 22.7 (L) 26.0 - 34.0 pg   MCHC 30.7 30.0 - 36.0 g/dL   RDW 17.8 (H) 11.5 - 15.5 %   Platelets 637 (H) 150 - 400 K/uL  BNP (order ONLY if patient complains of dyspnea/SOB AND you have documented it for THIS visit)  Result Value Ref Range   B Natriuretic Peptide 225.0 (H) 0.0 - 100.0 pg/mL  Basic metabolic  panel  Result Value Ref Range   Sodium 140 135 - 145 mmol/L   Potassium 4.2 3.5 - 5.1 mmol/L   Chloride 106 96 - 112 mmol/L   CO2 25 19 - 32 mmol/L   Glucose, Bld 84 70 - 99 mg/dL   BUN 20 6 - 23 mg/dL   Creatinine, Ser 1.51 (H) 0.50 - 1.35 mg/dL   Calcium 8.7 8.4 - 10.5 mg/dL   GFR calc non Af Amer 47 (L) >90 mL/min   GFR calc Af Amer 55 (L) >90 mL/min   Anion gap 9 5 - 15  I-stat troponin, ED (not at [facility])  Result Value Ref Range   Troponin i, poc 0.01 0.00 - 0.08 ng/mL   Comment 3          I-Stat Troponin, ED (not at [facility])  Result Value Ref Range   Troponin i, poc 0.02 0.00 - 0.08 ng/mL   Comment 3             Imaging Review No results found.   EKG Interpretation   Date/Time:  Sunday June 21 2014 10:03:07 EST Ventricular Rate:  92 PR Interval:  180 QRS Duration: 115 QT Interval:  418 QTC Calculation: 517 R Axis:   -87 Text Interpretation:  Sinus rhythm Nonspecific IVCD with LAD Anterolateral  infarct, age indeterminate VENTRICULAR PACED RHYTHM Confirmed by Rogene Houston   MD, Kempton Milne [id number]) on 06/21/2014 10:08:18 AM      Pacemaker interrogated, [employer] pacemaker. No significant abnormalities found.    MDM   Final diagnoses:  Chest pain, unspecified chest pain  type  Pacemaker  SOB (shortness of breath)   Discussed with the cardiology. Based on the prolonged shortness of breath have some concerns for proximal complication due to the pacemaker wires replacement. Recommend CT angiogram to rule out blood clot as well as to rule out any paracardial abnormalities. Patient had a negative cardiac cath in 2013. Troponins 2 are negative.  CT angiogram ordered of disposition will be based on the results.   Fredia Sorrow, MD 06/21/14 [phone]

## 2014-06-21 NOTE — ED Notes (Signed)
Repeat EKG was done after patient returned back to room after walking to bathroom, the patient had gotten very dizzy and having chest pains, Nurse aware.

## 2014-06-21 NOTE — ED Notes (Signed)
Dr Caryl Comes placed pacemaker.  Dr Stanford Breed is cardiologist

## 2014-06-21 NOTE — ED Notes (Signed)
Patient was given two cups of ice water.

## 2014-06-21 NOTE — H&P (Signed)
History and Physical   Admit date: 06/21/2014 Name:  William Villanueva Medical record number: [id number] DOB/Age:  08-Aug-1950  64 y.o. male  Referring Physician:   [facility]  Primary Cardiologist:  Dr. Kirk Ruths   Chief complaint/reason for admission: Severe breath and chest pain   HPI:  This 64 year old male has a history of positive for anemia vera and a nonischemic cardiomyopathy.  Catheterization in 2013 showed mild nonobstructive coronary artery disease.  He has been obese for several years.  He has been on disability with his back and has episodic constipation and diarrhea.  He had placement of a biventricular pacemaker in December.  He had a hypotensive episode following the procedure and there was a question about whether he had a density at the apex noted on echo.  He was discharged home the next morning but since his pacemaker insertion is continued to have shortness of breath and episodic chest discomfort.  There've been concerns about low blood pressure.  He has had frequent visits and calls and has noted to have some atrial tachycardia on his pacemaker.  His Coreg dose has been increased at times.  He presented to the emergency room with midsternal chest discomfort described as indigestion and severe shortness of breath that started this morning.  The pain was not pleuritic.  He has had 2 negative enzymes and an unremarkable EKG.  BNP was only mildly elevated.  He does have some episodic phlebotomy in the past.   Past Medical History  Diagnosis Date  . Polycythemia vera(238.4)   . Back pain   . Hemochromatosis   . Gout   . Hypertension   . Nephrolithiasis   . Cardiomyopathy     Nonischemic 45%.   [name] Hypospadias 01-Oct-1950    born with     Past Surgical History  Procedure Laterality Date  . Ankle surgery    . Pilonidal cyst excision    . Tonsillectomy    . Bi-ventricular pacemaker insertion N/A 04/29/2014    Procedure: BI-VENTRICULAR PACEMAKER INSERTION (CRT-P);   Surgeon: Deboraha Sprang, MD;  Location: [facility] CATH LAB;  Service: Cardiovascular;  Laterality: N/A;    Allergies: is allergic to prednisone and wellbutrin.   Medications: Prior to Admission medications   Medication Sig Start Date End Date Taking? Authorizing Provider  anagrelide (AGRYLIN) 1 MG capsule Take 2 capsules a day alternating with 1 capsule a day. 03/18/14  Yes Volanda Napoleon, MD  aspirin 81 MG tablet Take 81 mg by mouth daily.   Yes Historical Provider, MD  carvedilol (COREG) 12.5 MG tablet Take 1 tablet (12.5 mg total) by mouth 2 (two) times daily. Patient taking differently: Take 18.5 mg by mouth 2 (two) times daily. INCREASE TO 18.5 MG 06/11/14  Yes Lelon Perla, MD  ciprofloxacin-dexamethasone [facility]) otic suspension Place 3-4 drops into the right ear 2 (two) times daily.   Yes Historical Provider, MD  COLCRYS 0.6 MG tablet Take 0.6 mg by mouth as needed (gout).  03/26/13  Yes Historical Provider, MD  dicyclomine (BENTYL) 20 MG tablet Take 20 mg by mouth 2 (two) times daily.   Yes Historical Provider, MD  docusate sodium (COLACE) 100 MG capsule Take 100 mg by mouth as needed for mild constipation or moderate constipation.    Yes Historical Provider, MD  febuxostat (ULORIC) 40 MG tablet Take 1 tablet (40 mg total) by mouth daily. 05/20/14  Yes Volanda Napoleon, MD  Lactobacillus (PROBIATA PO) Take by mouth every morning.  Yes Historical Provider, MD  lisinopril (PRINIVIL,ZESTRIL) 5 MG tablet Take 1 tablet (5 mg total) by mouth daily. 06/12/14  Yes Lelon Perla, MD  omeprazole (PRILOSEC) 20 MG capsule Take 20 mg by mouth daily.    Yes Historical Provider, MD  oxymorphone (OPANA) 5 MG tablet Take 10 mg by mouth every 4 (four) hours as needed for pain. INCREASED TO 10 MG   Yes Historical Provider, MD  polyvinyl alcohol (LIQUIFILM TEARS) 1.4 % ophthalmic solution Place 1-2 drops into both eyes as needed for dry eyes.   Yes Historical Provider, MD  tamsulosin (FLOMAX) 0.4 MG CAPS  capsule Take 1 capsule (0.4 mg total) by mouth as needed. Patient taking differently: Take 0.4 mg by mouth daily.  05/22/14  Yes Volanda Napoleon, MD  temazepam (RESTORIL) 22.5 MG capsule Take 1 capsule (22.5 mg total) by mouth at bedtime as needed for sleep. Patient taking differently: Take 22.5 mg by mouth at bedtime.  03/18/14  Yes Volanda Napoleon, MD    Family History:  No family status information on file.    Social History:   reports that he quit smoking about 14 months ago. His smoking use included Cigarettes. He started smoking about 46 years ago. He has a 44 pack-year smoking history. He has never used smokeless tobacco. He reports that he drinks alcohol. He reports that he does not use illicit drugs.   History   Social History Narrative   Lives at home with wife.      Review of Systems:  He has severe chronic back pain and has constipation from narcotics as well as episodic diarrhea and there has been a concern about dehydration.  He also has urinary difficulties. Other than as noted above, the remainder of the review of systems is normal  Physical Exam: BP 108/54 mmHg  Pulse 86  Temp(Src) 98.3 F (36.8 C) (Oral)  Resp 16  Ht 5\' 10"  (1.778 m)  Wt 123.832 kg (273 lb)  BMI 39.17 kg/m2  SpO2 98% General appearance: Obese white male currently in no acute distress Head: Normocephalic, without obvious abnormality, atraumatic Eyes: conjunctivae/corneas clear. PERRL, EOM's intact. Fundi : Not examined Neck: no adenopathy, no carotid bruit, no JVD and supple, symmetrical, trachea midline Lungs: clear to auscultation bilaterally Heart: regular rate and rhythm, S1, S2 normal, no murmur, click, rub or gallop Abdomen: soft, non-tender; bowel sounds normal; no masses,  no organomegaly Rectal: deferred Extremities: extremities normal, atraumatic, no cyanosis or edema Pulses: 2+ and symmetric Neurologic: Grossly normal  Labs: CBC  Recent Labs  06/21/14 1103  WBC 13.3*  RBC  5.90*  HGB 13.4  HCT 43.6  PLT 637*  MCV 73.9*  MCH 22.7*  MCHC 30.7  RDW 17.8*   CMP   Recent Labs  06/21/14 1236  NA 140  K 4.2  CL 106  CO2 25  GLUCOSE 84  BUN 20  CREATININE 1.51*  CALCIUM 8.7  GFRNONAA 47*  GFRAA 55*   BNP    Component Value Date/Time   BNP 225.0* 06/21/2014 1103   Cardiac Panel (last 3 results) Troponin (Point of Care Test)  Recent Labs  06/21/14 1305  TROPIPOC 0.02    Thyroid  Lab Results  Component Value Date   TSH 0.424 04/28/2014    EKG: Sinus rhythm with paced ventricular beats, PVCs  Radiology: Cardiomegaly and pulmonary venous hypertension   IMPRESSIONS: 1.  Dyspnea and chest pain in the patient with recent pacemaker insertion-concern is whether he could've  had a microperforation.  He has had labile blood pressures as well as some atrial arrhythmias and has had frequent episodes of visits as well as concern about dyspnea and chest pain.  Cardiac catheterization 01/15/2012 showed mild disease in the circumflex and LAD and posterolateral 40% stenosis with normal LV function. 2  Morbid obesity 3.  Hemachromatosis 4.  Prior permanent pacemaker 5.  Polycythemia vera 6.  Chronic low back pain  7.  Hypertension 8.  Nonischemic cardiomyopathy  PLAN:  Obtain CTA to evaluate pericardial space as well as evaluate for pulmonary embolus in light of recent immobilization as well as recent pacemaker insertion.  Check serial enzymes.  May need myocardial perfusion scan or further evaluation for other causes of dyspnea.  Keep nothing by mouth after midnight.       Signed: Kerry Hough MD [facility]  06/21/2014, 4:09 PM

## 2014-06-22 ENCOUNTER — Other Ambulatory Visit (HOSPITAL_COMMUNITY): Payer: Self-pay

## 2014-06-22 ENCOUNTER — Other Ambulatory Visit (HOSPITAL_COMMUNITY): Payer: 59

## 2014-06-22 ENCOUNTER — Observation Stay (HOSPITAL_COMMUNITY): Payer: 59

## 2014-06-22 DIAGNOSIS — R0609 Other forms of dyspnea: Secondary | ICD-10-CM

## 2014-06-22 DIAGNOSIS — I209 Angina pectoris, unspecified: Secondary | ICD-10-CM

## 2014-06-22 DIAGNOSIS — I10 Essential (primary) hypertension: Secondary | ICD-10-CM

## 2014-06-22 DIAGNOSIS — R0600 Dyspnea, unspecified: Secondary | ICD-10-CM

## 2014-06-22 LAB — TROPONIN I: Troponin I: 0.03 ng/mL (ref <0.031)

## 2014-06-22 MED ORDER — CARVEDILOL 25 MG PO TABS
25.0000 mg | ORAL_TABLET | Freq: Two times a day (BID) | ORAL | Status: DC
Start: 2014-06-22 — End: 2014-06-23
  Administered 2014-06-22 – 2014-06-23 (×2): 25 mg via ORAL
  Filled 2014-06-22 (×4): qty 1

## 2014-06-22 MED ORDER — REGADENOSON 0.4 MG/5ML IV SOLN
INTRAVENOUS | Status: AC
  Administered 2014-06-22: 0.4 mg via INTRAVENOUS
  Filled 2014-06-22: qty 5

## 2014-06-22 MED ORDER — REGADENOSON 0.4 MG/5ML IV SOLN
0.4000 mg | Freq: Once | INTRAVENOUS | Status: AC
  Administered 2014-06-22: 0.4 mg via INTRAVENOUS
  Filled 2014-06-22: qty 5

## 2014-06-22 NOTE — Progress Notes (Signed)
UR completed 

## 2014-06-22 NOTE — Progress Notes (Signed)
  Echocardiogram 2D Echocardiogram has been performed.  Darlina Sicilian M 06/22/2014, 12:30 PM

## 2014-06-22 NOTE — Progress Notes (Signed)
Subjective: He reports CP 4/10 yesterday and dyspnea periodically through the night and this morning.  Objective: Vital signs in last 24 hours: Temp:  [98 F (36.7 C)-98.6 F (37 C)] 98.4 F (36.9 C) (02/15 0456) Pulse Rate:  [76-89] 86 (02/15 0456) Resp:  [12-22] 18 (02/15 0456) BP: (108-150)/(54-84) 137/77 mmHg (02/15 0456) SpO2:  [96 %-99 %] 99 % (02/15 0456) Weight:  [280 lb 3.3 oz (127.1 kg)] 280 lb 3.3 oz (127.1 kg) (02/14 1750) Last BM Date: 06/22/14  Intake/Output from previous day:   Intake/Output this shift:    Medications . anagrelide  1 mg Oral Daily  . aspirin  324 mg Oral NOW   Or  . aspirin  300 mg Rectal NOW  . aspirin EC  81 mg Oral Daily  . carvedilol  18.75 mg Oral BID WC  . dicyclomine  20 mg Oral BID  . enoxaparin (LOVENOX) injection  40 mg Subcutaneous Q24H  . febuxostat  40 mg Oral Daily  . lisinopril  5 mg Oral Daily  . pantoprazole  40 mg Oral Daily  . tamsulosin  0.4 mg Oral Daily  . temazepam  22.5 mg Oral QHS   . sodium chloride 75 mL/hr at 06/21/14 2248    PE: General appearance: alert, cooperative and no distress Lungs: clear to auscultation bilaterally Heart: regular rate and rhythm, S1, S2 normal, no murmur, click, rub or gallop Extremities: No LEE Pulses: 2+ and symmetric Skin: Warm and dry Neurologic: Grossly normal  Lab Results:   Recent Labs  06/21/14 1103 06/21/14 1642  WBC 13.3* 12.6*  HGB 13.4 12.6*  HCT 43.6 42.2  PLT 637* 631*   BMET  Recent Labs  06/21/14 1236 06/21/14 1642  NA 140  --   K 4.2  --   CL 106  --   CO2 25  --   GLUCOSE 84  --   BUN 20  --   CREATININE 1.51* 1.34  CALCIUM 8.7  --     Assessment/Plan  Active Problems:   Dyspnea  1. Dyspnea and chest pain  Recent pacemaker insertion-concern is whether he could've had a microperforation. He has had labile blood pressures as well as some atrial arrhythmias and has had frequent episodes of dyspnea and chest pain. Cardiac  catheterization 01/15/2012 showed mild disease in the circumflex and LAD and posterolateral 40% stenosis with normal LV function.  CTA chest negative for PE or pneumonthorax.  Small amount of pericardial fluid.  No active Pulm disease.  Ruled out for MI.  Echo being completed now.  Two day Lexiscan today and tomorrow.    4 beat NSVT.  I doubt contributing to his dyspnea.  He does report periods of rapid heart beat but they do not cause dyspnea.  The dyspnea is also sporadic; once every 2-3 days whereas the increased HR can be daily.   2  Morbid obesity 3. Hemachromatosis 4. Prior permanent pacemaker 5. Polycythemia vera 6. Chronic low back pain  7. Hypertension  108/54-150/84. Continue current meds.  Coreg 18.75bid, lisinopril 5. 8. Nonischemic cardiomyopathy  EF 45% in Dec 2015.   Appears euvolemic.      Tarri Fuller PA-C 06/22/2014 9:52 AM   The patient was seen, examined and discussed with Brittainy M. Rosita Fire, PA-C and I agree with the above.   64 year old male with recent PM insertion, admitted with SOB and CP, RV perforation ruled out, MI ruled out, evidence of non-obstructive CAD on cath in 2013,  now with acute on chronic CKD, awaiting a stress test - 2 day protocal sec to obesity. BP elevated, I would increase his carvedilol to 25 mg po BID.  Dorothy Spark 06/22/2014

## 2014-06-22 NOTE — Progress Notes (Signed)
Nutrition Brief Note  Patient identified on the Malnutrition Screening Tool (MST) Report  Wt Readings from Last 15 Encounters:  06/21/14 280 lb 3.3 oz (127.1 kg)  06/17/14 278 lb (126.1 kg)  06/11/14 278 lb 9.6 oz (126.372 kg)  05/20/14 276 lb (125.193 kg)  05/18/14 275 lb 6.4 oz (124.921 kg)  04/30/14 277 lb 9 oz (125.9 kg)  03/18/14 284 lb (128.822 kg)  02/06/14 287 lb (130.182 kg)  12/26/13 288 lb (130.636 kg)  11/27/13 291 lb (131.997 kg)  10/29/13 289 lb (131.09 kg)  08/06/13 301 lb (136.533 kg)  06/05/13 296 lb (134.265 kg)  05/26/13 297 lb (134.718 kg)  05/16/13 298 lb (135.172 kg)    Body mass index is 40.21 kg/(m^2). Patient meets criteria for morbid obesity based on current BMI.   Pt is current NPO. Pt reports having a great appetite currently and PTA at home with no other difficulties. Pt reports he has been eating healthier at meal and is trying to lose weight. Labs and medications reviewed.   No nutrition interventions warranted at this time. If nutrition issues arise, please consult RD.   Kallie Locks, MS, RD, LDN Pager # [phone] After hours/ weekend pager # [phone]

## 2014-06-22 NOTE — Progress Notes (Signed)
Tolerated the Lexiscan fairly well.  Very dyspneic but resolved.  Yarnell Arvidson, PAC

## 2014-06-23 DIAGNOSIS — R072 Precordial pain: Secondary | ICD-10-CM

## 2014-06-23 DIAGNOSIS — I25708 Atherosclerosis of coronary artery bypass graft(s), unspecified, with other forms of angina pectoris: Secondary | ICD-10-CM

## 2014-06-23 MED ORDER — CARVEDILOL 25 MG PO TABS
25.0000 mg | ORAL_TABLET | Freq: Two times a day (BID) | ORAL | Status: DC
Start: 2014-06-23 — End: 2015-01-19

## 2014-06-23 MED ORDER — TECHNETIUM TC 99M SESTAMIBI GENERIC - CARDIOLITE
30.0000 | Freq: Once | INTRAVENOUS | Status: AC | PRN
  Administered 2014-06-23: 30 via INTRAVENOUS

## 2014-06-23 MED ORDER — ISOSORBIDE MONONITRATE ER 30 MG PO TB24: 30.0000 mg | ORAL_TABLET | Freq: Every day | ORAL | Status: DC

## 2014-06-23 MED ORDER — LOPERAMIDE HCL 2 MG PO CAPS: 2.0000 mg | ORAL_CAPSULE | ORAL | Status: DC | PRN

## 2014-06-23 MED ORDER — TECHNETIUM TC 99M SESTAMIBI GENERIC - CARDIOLITE
30.0000 | Freq: Once | INTRAVENOUS | Status: AC | PRN
  Administered 2014-06-22: 30 via INTRAVENOUS

## 2014-06-23 MED ORDER — ISOSORBIDE MONONITRATE ER 30 MG PO TB24
30.0000 mg | ORAL_TABLET | Freq: Every day | ORAL | Status: DC
  Filled 2014-06-23: qty 1

## 2014-06-23 NOTE — Progress Notes (Signed)
PATIENT CURRENTLY RESTING IN BED. MEDICATED FOR BACK PAIN THIS SHIFT. PATIENT REQUESTED MEDICATION FOR DIARRHEA AS WELL. IMMODIUM ORDERED PRN--NOT YET GIVEN. PATIENT UP TO SIDE OF BED AND TO BATHROOM PRN.  INSTRUCTED TO CALL FOR ASSISTANCE WHEN NEEDED.

## 2014-06-23 NOTE — Discharge Instructions (Signed)
Shortness of Breath °Shortness of breath means you have trouble breathing. Shortness of breath needs medical care right away. °HOME CARE  °· Do not smoke. °· Avoid being around chemicals or things (paint fumes, dust) that may bother your breathing. °· Rest as needed. Slowly begin your normal activities. °· Only take medicines as told by your doctor. °· Keep all doctor visits as told. °GET HELP RIGHT AWAY IF:  °· Your shortness of breath gets worse. °· You feel lightheaded, pass out (faint), or have a cough that is not helped by medicine. °· You cough up blood. °· You have pain with breathing. °· You have pain in your chest, arms, shoulders, or belly (abdomen). °· You have a fever. °· You cannot walk up stairs or exercise the way you normally do. °· You do not get better in the time expected. °· You have a hard time doing normal activities even with rest. °· You have problems with your medicines. °· You have any new symptoms. °MAKE SURE YOU: °· Understand these instructions. °· Will watch your condition. °· Will get help right away if you are not doing well or get worse. °Document Released: 10/11/2007 Document Revised: 04/29/2013 Document Reviewed: 07/10/2011 °ExitCare® Patient Information ©2015 [employer]. This information is not intended to replace advice given to you by your health care provider. Make sure you discuss any questions you have with your health care provider. ° °

## 2014-06-23 NOTE — Progress Notes (Signed)
UR completed 

## 2014-06-23 NOTE — Discharge Summary (Signed)
Discharge Summary   Patient ID: William Villanueva,  MRN: [id number], DOB/AGE: July 04, 1950 64 y.o.  Admit date: 06/21/2014 Discharge date: 06/23/2014  Primary Care Provider: Gara Kroner Primary Cardiologist: Dr. Kirk Ruths  Discharge Diagnoses Principal Problem:   Chest tightness Active Problems:   Polycythemia vera   Back pain   Tobacco use   Congestive dilated cardiomyopathy   Pacemaker   Dyspnea   Allergies Allergies  Allergen Reactions  . Prednisone Other (See Comments)    Abdominal pain  . Wellbutrin [Bupropion] Hives    Procedures  Echocardiogram 06/22/2014 - Left ventricle: The cavity size was mildly dilated. Wall thickness was normal. Systolic function was normal. The estimated ejection fraction was in the range of 50% to 55%. Doppler parameters are consistent with abnormal left ventricular relaxation (grade 1 diastolic dysfunction). - Left atrium: The atrium was moderately dilated. - Pulmonary arteries: PA peak pressure: 38 mm Hg (S).  Impressions:  - Poor acoustic windows, even with echo contrast, limit study     2 day stress test 06/23/2014 IMPRESSION: 1. No reversible ischemia or infarction.  2. Normal left ventricular wall motion.  3. Left ventricular ejection fraction 35%  4. Intermediate-risk stress test findings* secondary to reduced ejection fraction of 35%. Otherwise there is no ischemia identified. Personally reviewed echocardiogram which appears to have reduced ejection fraction in the 40% range.    Hospital Course  The patient is a 64 year old male with past medical history of polycythemia vera, hypertension, history of nonischemic cardiomyopathy with baseline EF 45%. His last cardiac catheterization in 2013 showed mild nonobstructive CAD. He had placement of biventricular pacemaker in December 2015. Patient presented to [facility] on 06/21/2014. According to him, ever since pacemaker placement, he has been having  shortness breath and episodic chest discomfort. He was admitted to cardiology service, serial troponin when has been negative. CTA was negative for PE or pneumothorax, was noted he had a small amount of pericardial fluid, however and no active pulmonary disease. Echocardiogram was obtained on 06/22/2014 which showed EF 08-65%, grade 1 diastolic dysfunction, PA peak pressure 38. No pericardial effusion was noted on echocardiogram. There was no obvious sign of microperforation after recently pacemaker placement. Given his persistent symptoms, 2 day lexiscan stress test was obtained which came back on 06/23/2014. The stress test shows EF 35%, normal left ventricular wall motion, no reversible ischemia or infarction.   Patient was seen in the morning of 2/16, at which time he denies any obvious chest pain. He is deemed stable for discharge from cardiology perspective. During this admission, his carvedilol was increased to 25 mg twice a day for better rate control. Imdur was added to help with chest pain. Patient will need a outpatient sleep study which her wife has already scheduled for him at the beginning of March.    Discharge Vitals Blood pressure 138/80, pulse 77, temperature 98 F (36.7 C), temperature source Oral, resp. rate 18, height 5\' 10"  (1.778 m), weight 280 lb 3.3 oz (127.1 kg), SpO2 96 %.  Filed Weights   06/21/14 0950 06/21/14 1750  Weight: 273 lb (123.832 kg) 280 lb 3.3 oz (127.1 kg)    Labs  CBC  Recent Labs  06/21/14 1103 06/21/14 1642  WBC 13.3* 12.6*  HGB 13.4 12.6*  HCT 43.6 42.2  MCV 73.9* 76.0*  PLT 637* 784*   Basic Metabolic Panel  Recent Labs  06/21/14 1236 06/21/14 1642  NA 140  --   K 4.2  --   CL 106  --  CO2 25  --   GLUCOSE 84  --   BUN 20  --   CREATININE 1.51* 1.34  CALCIUM 8.7  --    Cardiac Enzymes  Recent Labs  06/21/14 1642 06/21/14 2216 06/22/14 0443  TROPONINI 0.03 0.03 0.03    Disposition  Pt is being discharged home today in  good condition.  Follow-up Plans & Appointments      Follow-up Information    Follow up with Kirk Ruths, MD.   Specialty:  Cardiology   Why:  Office will contact you for followup, please give [place] a call if you do not hear from [place] in 2 business days   Contact information:   [address] [place] [address] [phone]       Follow up with Gara Kroner, MD.   Specialty:  Family Medicine   Why:  Followup with your primary care physician regarding further workup. Need outpatient sleep study   Contact information:   [place], [place] [address] [phone]       Follow up with [name], MD On 07/31/2014.   Specialty:  Cardiology   Why:  9:00am. Pacemaker check   Contact information:   [address] [place] [address] [phone]       Discharge Medications    Medication List    TAKE these medications        anagrelide 1 MG capsule  Commonly known as:  AGRYLIN  Take 2 capsules a day alternating with 1 capsule a day.     aspirin 81 MG tablet  Take 81 mg by mouth daily.     carvedilol 25 MG tablet  Commonly known as:  COREG  Take 1 tablet (25 mg total) by mouth 2 (two) times daily with a meal.     ciprofloxacin-dexamethasone otic suspension  Commonly known as:  CIPRODEX  Place 3-4 drops into the right ear 2 (two) times daily.     COLCRYS 0.6 MG tablet  Generic drug:  colchicine  Take 0.6 mg by mouth as needed (gout).     dicyclomine 20 MG tablet  Commonly known as:  BENTYL  Take 20 mg by mouth 2 (two) times daily.     docusate sodium 100 MG capsule  Commonly known as:  COLACE  Take 100 mg by mouth as needed for mild constipation or moderate constipation.     febuxostat 40 MG tablet  Commonly known as:  ULORIC  Take 1 tablet (40 mg total) by mouth daily.     isosorbide mononitrate 30 MG 24 hr tablet  Commonly known as:  IMDUR  Take 1 tablet (30 mg total) by mouth daily.      lisinopril 5 MG tablet  Commonly known as:  PRINIVIL,ZESTRIL  Take 1 tablet (5 mg total) by mouth daily.     omeprazole 20 MG capsule  Commonly known as:  PRILOSEC  Take 20 mg by mouth daily.     oxymorphone 5 MG tablet  Commonly known as:  OPANA  Take 10 mg by mouth every 4 (four) hours as needed for pain. INCREASED TO 10 MG     polyvinyl alcohol 1.4 % ophthalmic solution  Commonly known as:  LIQUIFILM TEARS  Place 1-2 drops into both eyes as needed for dry eyes.     PROBIATA PO  Take by mouth every morning.     tamsulosin 0.4 MG Caps capsule  Commonly known as:  FLOMAX  Take 1 capsule (0.4 mg total) by mouth as needed.     temazepam 22.5 MG capsule  Commonly known as:  RESTORIL  Take 1 capsule (22.5 mg total) by mouth at bedtime as needed for sleep.         Duration of Discharge Encounter   Greater than 30 minutes including physician time.  Hilbert Corrigan PA-C Pager: [phone] 06/23/2014, 2:47 PM

## 2014-06-23 NOTE — Progress Notes (Signed)
Patient Name: William Villanueva Date of Encounter: 06/23/2014  Primary Cardiologist: Dr. Kirk Ruths    Active Problems:   Dyspnea    SUBJECTIVE  Denies any CP overnight. Occasional SOB. Per patient, he's been having some SOB once a week, unclear what caused it.    CURRENT MEDS . anagrelide  1 mg Oral Daily  . aspirin EC  81 mg Oral Daily  . carvedilol  25 mg Oral BID WC  . dicyclomine  20 mg Oral BID  . enoxaparin (LOVENOX) injection  40 mg Subcutaneous Q24H  . febuxostat  40 mg Oral Daily  . lisinopril  5 mg Oral Daily  . pantoprazole  40 mg Oral Daily  . tamsulosin  0.4 mg Oral Daily  . temazepam  22.5 mg Oral QHS    OBJECTIVE  Filed Vitals:   06/22/14 1400 06/22/14 1401 06/22/14 2055 06/23/14 0443  BP: 156/83 154/83 154/82 145/80  Pulse: 96 94 85 77  Temp:   98.3 F (36.8 C) 97.6 F (36.4 C)  TempSrc:   Oral Oral  Resp:   18 18  Height:      Weight:      SpO2:   98% 94%    Intake/Output Summary (Last 24 hours) at 06/23/14 0845 Last data filed at 06/23/14 0600  Gross per 24 hour  Intake    900 ml  Output      0 ml  Net    900 ml   Filed Weights   06/21/14 0950 06/21/14 1750  Weight: 273 lb (123.832 kg) 280 lb 3.3 oz (127.1 kg)    PHYSICAL EXAM  General: Pleasant, NAD. Neuro: Alert and oriented X 3. Moves all extremities spontaneously. Psych: Normal affect. HEENT:  Normal  Neck: Supple without bruits or JVD. Lungs:  Resp regular and unlabored, CTA. Heart: RRR no s3, s4, or murmurs. Abdomen: Soft, non-tender, non-distended, BS + x 4.  Extremities: No clubbing, cyanosis or edema. DP/PT/Radials 2+ and equal bilaterally.  Accessory Clinical Findings  CBC  Recent Labs  06/21/14 1103 06/21/14 1642  WBC 13.3* 12.6*  HGB 13.4 12.6*  HCT 43.6 42.2  MCV 73.9* 76.0*  PLT 637* 449*   Basic Metabolic Panel  Recent Labs  06/21/14 1236 06/21/14 1642  NA 140  --   K 4.2  --   CL 106  --   CO2 25  --   GLUCOSE 84  --   BUN 20  --     CREATININE 1.51* 1.34  CALCIUM 8.7  --    Cardiac Enzymes  Recent Labs  06/21/14 1642 06/21/14 2216 06/22/14 0443  TROPONINI 0.03 0.03 0.03    TELE Paced rhythm with HR 70s, 1 episode of burst rhythm at 6am this morning. Occasional NSVT    ECG  No new EKG  Echocardiogram 06/22/2014  - Left ventricle: The cavity size was mildly dilated. Wall thickness was normal. Systolic function was normal. The estimated ejection fraction was in the range of 50% to 55%. Doppler parameters are consistent with abnormal left ventricular relaxation (grade 1 diastolic dysfunction). - Left atrium: The atrium was moderately dilated. - Pulmonary arteries: PA peak pressure: 38 mm Hg (S).  Impressions:  - Poor acoustic windows, even with echo contrast, limit study    Radiology/Studies  Ct Angio Chest Pe W/cm &/or Wo Cm  06/21/2014   CLINICAL DATA:  Acute onset of extreme shortness of breath.  EXAM: CT ANGIOGRAPHY CHEST WITH CONTRAST  TECHNIQUE: Multidetector CT imaging of the  chest was performed using the standard protocol during bolus administration of intravenous contrast. Multiplanar CT image reconstructions and MIPs were obtained to evaluate the vascular anatomy.  CONTRAST:  159m OMNIPAQUE IOHEXOL 350 MG/ML SOLN  COMPARISON:  Chest radiography same day.  FINDINGS: Pulmonary arterial opacification is moderate. There is moderate motion degradation. There are no central pulmonary emboli. Distal vessels can't be accurately evaluated.  The lung parenchyma is clear. No edema, infiltrate or collapse. No focal lesion.  Normal hilar lymph nodes.  No pleural fluid. Tiny amount of pericardial fluid, small amount of pericardial fluid.  Dual lead pacemaker in place. Coronary artery calcification is noted in both the right and left systems. There is mild aortic atherosclerosis.  Chronic spondylosis deformans of the spine with bridging osteophytes. Mild loss of height of the T5 vertebral body which looks  old and healed.  No abnormality in the visualized upper abdomen.  Review of the MIP images confirms the above findings.  IMPRESSION: No demonstrable pulmonary emboli. Central vessels are clear. Distal vessels are not well seen because of motion and only moderate opacification.  No active pulmonary disease.  Coronary artery calcification.  Small amount of pericardial fluid.   Electronically Signed   By: MNelson ChimesM.D.   On: 06/21/2014 17:49   Dg Chest Portable 1 View  06/21/2014   CLINICAL DATA:  Midline chest pain. Shortness of breath. Cardiomyopathy. Hypertension.  EXAM: PORTABLE CHEST - 1 VIEW  COMPARISON:  04/30/2014  FINDINGS: Cardiomegaly is stable. Triple lead pacemaker remains in appropriate position. Pulmonary vascular congestion is stable and consistent with pulmonary venous hypertension. No evidence of acute pulmonary infiltrate or edema. No evidence of pleural effusion.  IMPRESSION: Stable cardiomegaly and pulmonary venous hypertension. No acute findings.   Electronically Signed   By: JEarle GellM.D.   On: 06/21/2014 11:30    ASSESSMENT AND PLAN  1. Dyspnea and chest pain  - Cardiac catheterization 01/15/2012 showed mild disease in the circumflex and LAD and posterolateral 40% stenosis with normal LV function. - CTA chest negative for PE or pneumonthorax. Small amount of pericardial fluid. No active Pulm disease. Ruled out for MI.  - Echo 06/22/2014 EF 549-67% grade diastolic dysfunction, PA peak pressure 366mg. No pericardial effusion. No obvious sign of microperferation after recent PPM   - pending part 2 of 2 day lexiscan, if negative, can be discharged this afternoon.   2 Morbid obesity 3. Hemachromatosis 4. Recent permanent pacemaker insertion 5. Polycythemia vera 6. Chronic low back pain  7. Hypertension - Continue current meds. Coreg 25bid, lisinopril 5.  - short burst of ?paced? Rhythm at 6AM this morning, unclear reason, did not last. Coreg  increased to 2527mID yesterday. Still has occasional NSVT  8. 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 Appears euvolemic.   EF improved to 50-55% this admission   Signed, MenWoodward Kuger: [phone] patient was seen, examined and discussed with HaoAlmyra DeforestA-C and I agree with the above.   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 BP elevated,carvedilol was increased to to 25 mg po BID yesterday, BP still elevated, I would add imdur 30 mg po daily. He can be discharged home, we will follow.  NELDorothy SparkD  06/23/2014

## 2014-06-26 ENCOUNTER — Telehealth: Payer: Self-pay | Admitting: Cardiology

## 2014-06-29 NOTE — Telephone Encounter (Signed)
Closed encounter °

## 2014-07-09 ENCOUNTER — Encounter: Payer: Self-pay | Admitting: Internal Medicine

## 2014-07-23 ENCOUNTER — Ambulatory Visit (INDEPENDENT_AMBULATORY_CARE_PROVIDER_SITE_OTHER): Payer: 59 | Admitting: Internal Medicine

## 2014-07-23 ENCOUNTER — Encounter: Payer: Self-pay | Admitting: Internal Medicine

## 2014-07-23 VITALS — BP 120/80 | HR 85 | Ht 69.5 in | Wt 268.4 lb

## 2014-07-23 DIAGNOSIS — R0600 Dyspnea, unspecified: Secondary | ICD-10-CM | POA: Diagnosis not present

## 2014-07-23 DIAGNOSIS — I429 Cardiomyopathy, unspecified: Secondary | ICD-10-CM | POA: Diagnosis not present

## 2014-07-23 MED ORDER — VALSARTAN 80 MG PO TABS
80.0000 mg | ORAL_TABLET | Freq: Every day | ORAL | Status: DC
Start: 2014-07-23 — End: 2014-11-26

## 2014-07-23 NOTE — Patient Instructions (Signed)
Omeprazole 20 mg Take 30-60 min before first meal of the day and pepcid 20 mg at bedtime  Stop lisnopril  Valsartan 80 mg daily   GERD (REFLUX)  is an extremely common cause of respiratory symptoms just like yours , many times with no obvious heartburn at all.    It can be treated with medication, but also with lifestyle changes including avoidance of late meals, excessive alcohol, smoking cessation, and avoid fatty foods, chocolate, peppermint, colas, red wine, and acidic juices such as orange juice.  NO MINT OR MENTHOL PRODUCTS SO NO COUGH DROPS  USE SUGARLESS CANDY INSTEAD (Jolley ranchers or Stover's or Life Savers) or even ice chips will also do - the key is to swallow to prevent all throat clearing. NO OIL BASED VITAMINS - use powdered substitutes.   Please schedule a follow up office visit in 4 weeks, sooner if needed pfts

## 2014-07-23 NOTE — Progress Notes (Signed)
Subjective:     Patient ID: William Villanueva, male   DOB: [birth year],    MRN: [id number]  HPI   18 yowm quit smoke Oct 2014 at wt 300 due to cost and ? Hurting in chest but  breathing ok s inhalers needed until gradually worsened since Dec 2015 referred by [facility] for unexplained sob p admit    Admit date: 06/21/2014 Discharge date: 06/23/2014  Primary Care Provider: Antony Contras Villanueva Primary Cardiologist: Dr. Kirk Villanueva  Discharge Diagnoses Principal Problem:  Chest tightness Active Problems:  Polycythemia vera  Back pain  Tobacco use  Congestive dilated cardiomyopathy  Pacemaker  Dyspnea   Allergies Allergies  Allergen Reactions  . Prednisone Other (See Comments)    Abdominal pain  . Wellbutrin [Bupropion] Hives    Procedures  Echocardiogram 06/22/2014 - Left ventricle: The cavity size was mildly dilated. Wall thickness was normal. Systolic function was normal. The estimated ejection fraction was in the range of 50% to 55%. Doppler parameters are consistent with abnormal left ventricular relaxation (grade 1 diastolic dysfunction). - Left atrium: The atrium was moderately dilated. - Pulmonary arteries: PA peak pressure: 38 mm Hg (S).  Impressions:  - Poor acoustic windows, even with echo contrast, limit study     2 day stress test 06/23/2014 IMPRESSION: 1. No reversible ischemia or infarction.  2. Normal left ventricular wall motion.  3. Left ventricular ejection fraction 35%  4. Intermediate-risk stress test findings* secondary to reduced ejection fraction of 35%. Otherwise there is no ischemia identified. Personally reviewed echocardiogram which appears to have reduced ejection fraction in the 40% range.    Hospital Course  The patient is a 64 year old male with past medical history of polycythemia vera, hypertension, history of nonischemic cardiomyopathy with baseline EF 45%. His last cardiac catheterization in 2013  showed mild nonobstructive CAD. He had placement of biventricular pacemaker in December 2015. Patient presented to [facility] on 06/21/2014. According to him, ever since pacemaker placement, he has been having shortness breath and episodic chest discomfort. He was admitted to cardiology service, serial troponin when has been negative. CTA was negative for PE or pneumothorax, was noted he had a small amount of pericardial fluid, however and no active pulmonary disease. Echocardiogram was obtained on 06/22/2014 which showed EF 81-44%, grade 1 diastolic dysfunction, PA peak pressure 38. No pericardial effusion was noted on echocardiogram. There was no obvious sign of microperforation after recently pacemaker placement. Given his persistent symptoms, 2 day lexiscan stress test was obtained which came back on 06/23/2014. The stress test shows EF 35%, normal left ventricular wall motion, no reversible ischemia or infarction.   Patient was seen in the morning of 2/16, at which time he denies any obvious chest pain. He is deemed stable for discharge from cardiology perspective. During this admission, his carvedilol was increased to 25 mg twice a day for better rate control. Imdur was added to help with chest pain. Patient will need a outpatient sleep study which her wife has already scheduled for him at the beginning of March.         07/23/2014 1st [facility] office visit/ William Villanueva  / on lisinopril 5 mg daily  Chief Complaint  Patient presents with  . Pulmonary Consult    Referred by William Villanueva. Pt c/o SOB and cough since Dec 2015. He states that he wakes up in the night gasping for air and feels SOB occ during the day. He is coughing up large amounts of sputum- normally clear, but  yellow for the past 2 days.    since Feb 6th some better   ?  With decreased acei to  5 mg daily on omeprazole with bfast  But periods of sob at rest esp at hs assoc with hacking cough and min mucoid sputum  No obvious day  to day or daytime variabilty or assoc   cp or chest tightness, subjective wheeze overt sinus or hb symptoms. No unusual exp hx or h/o childhood pna/ asthma or knowledge of premature birth.  Sleeping ok without nocturnal  or early am exacerbation  of respiratory  c/o's or need for noct saba. Also denies any obvious fluctuation of symptoms with weather or environmental changes or other aggravating or alleviating factors except as outlined above   Current Medications, Allergies, Complete Past Medical History, Past Surgical History, Family History, and Social History were reviewed in [employer] record.  ROS  The following are not active complaints unless bolded sore throat, dysphagia, dental problems, itching, sneezing,  nasal congestion or excess/ purulent secretions, ear ache,   fever, chills, sweats, unintended wt loss, pleuritic or exertional cp, hemoptysis,  orthopnea pnd or leg swelling, presyncope, palpitations, heartburn, abdominal pain, anorexia, nausea, vomiting, diarrhea  or change in bowel or urinary habits, change in stools or urine, dysuria,hematuria,  rash, arthralgias, visual complaints, headache, numbness weakness or ataxia or problems with walking or coordination,  change in mood/affect or memory.         Review of Systems     Objective:   Physical Exam    hoarse amb wm with classic pseudowheeze  Wt Readings from Last 3 Encounters:  07/23/14 268 lb 6.4 oz (121.745 kg)  06/21/14 280 lb 3.3 oz (127.1 kg)  06/17/14 278 lb (126.1 kg)    Vital signs reviewed   HEENT: nl dentition, turbinates, and orophanx. Nl external ear canals without cough reflex   NECK :  without JVD/Nodes/TM/ nl carotid upstrokes bilaterally   LUNGS: no acc muscle use, clear to A and P bilaterally without cough on insp or exp maneuvers   CV:  RRR  no s3 or murmur or increase in P2, no edema   ABD:  soft and nontender with nl excursion in the supine position. No bruits or  organomegaly, bowel sounds nl  MS:  warm without deformities, calf tenderness, cyanosis or clubbing  SKIN: warm and dry without lesions    NEURO:  alert, approp, no deficits     I personally reviewed images and agree with radiology impression as follows:  CXR:  06/21/14 No demonstrable pulmonary emboli. Central vessels are clear. Distal vessels are not well seen because of motion and only moderate opacification.  No active pulmonary disease.  Coronary artery calcification.  Small amount of pericardial fluid.   Assessment:

## 2014-07-23 NOTE — Assessment & Plan Note (Signed)
ACE inhibitors are problematic in  pts with airway complaints because  even experienced pulmonologists can't always distinguish ace effects from copd/asthma/pnds/ allergies etc.  By themselves they don't actually cause a problem, much like oxygen can't by itself start a fire, but they certainly serve as a powerful catalyst or enhancer for any "fire"  or inflammatory process in the upper airway, be it caused by an ET  tube or more commonly reflux (especially in the obese or pts with known GERD or who are on biphoshonates) or URI's, due to interference with bradykinin clearance.  The effects of acei on bradykinin levels occurs in 100% of pt's on acei (unless they surreptitiously stop the med!) but the classic cough is only reported in 5%.  This leaves 95% of pts on acei's  with a variety of syndromes including no identifiable symptom in most  vs non-specific symptoms that wax and wane depending on what other insult is occuring at the level of the upper airway esp GERD/LPR  Explained to pt and wife since we don't have another explanation for his symptoms needs at least 4 weeks off ACEi and will replace with valsartan 80 mg per day as approved by Dr Percival Spanish for these very settings.

## 2014-07-23 NOTE — Assessment & Plan Note (Signed)
  When respiratory symptoms begin or become refractory well after a patient reports complete smoking cessation,  Especially when this wasn't the case while they were smoking, a red flag is raised based on the work of Dr Kris Mouton which states:  if you quit smoking when your best day FEV1 is still well preserved it is highly unlikely you will progress to severe disease.  That is to say, once the smoking stops,  the symptoms should not suddenly erupt or markedly worsen.  If so, the differential diagnosis should include  obesity/deconditioning,  LPR/Reflux/Aspiration syndromes,  occult CHF, or  especially side effect of medications commonly used in this population, especially acei  > see hbp   He is lighter than he was when he quit smoking and had no symptoms then, has a nl CTa and chest exam with prominent pseudowheeze on exam  ddx includes gerd/lpr and sinus dz/ the former should be treated aggressively with ppi 30 min ac and h2 hs before returning for what I suspect will be a nl pft

## 2014-07-27 ENCOUNTER — Ambulatory Visit (INDEPENDENT_AMBULATORY_CARE_PROVIDER_SITE_OTHER): Payer: 59 | Admitting: Cardiology

## 2014-07-27 ENCOUNTER — Other Ambulatory Visit: Payer: Self-pay | Admitting: *Deleted

## 2014-07-27 ENCOUNTER — Encounter: Payer: Self-pay | Admitting: Cardiology

## 2014-07-27 VITALS — BP 110/68 | HR 68 | Ht 69.0 in | Wt 269.2 lb

## 2014-07-27 DIAGNOSIS — I420 Dilated cardiomyopathy: Secondary | ICD-10-CM

## 2014-07-27 DIAGNOSIS — G4700 Insomnia, unspecified: Secondary | ICD-10-CM

## 2014-07-27 DIAGNOSIS — G4701 Insomnia due to medical condition: Secondary | ICD-10-CM

## 2014-07-27 DIAGNOSIS — D45 Polycythemia vera: Secondary | ICD-10-CM

## 2014-07-27 MED ORDER — TEMAZEPAM 22.5 MG PO CAPS: 22.5000 mg | ORAL_CAPSULE | Freq: Every evening | ORAL | Status: DC | PRN

## 2014-07-27 NOTE — Progress Notes (Signed)
Cardiology Office Note   Date:  07/27/2014   ID:  Lakin Romer, DOB 1951/05/08, MRN [id number]  PCP:  Gara Kroner, MD  Cardiologist:  Dr. Stanford Breed    Chief Complaint  Patient presents with  . Chest Pain    sharp discomfort near pacer site for 2-3 night about 2 weeks ago      History of Present Illness: Basir Niven is a 64 y.o. male who presents for cardiomyopathy and chest pain.  FU cardiomyopathy. Echocardiogram in August of 2013 showed an ejection fraction of 35-40%. There was diffuse hypokinesis. Moderate left ventricular hypertrophy. The left atrium was mild to moderately dilated. Exercise treadmill in August of 2013 was negative. Cardiac catheterization in September of 2013 showed mild nonobstructive coronary disease. There was no hemodynamic evidence of restriction. Pulmonary capillary wedge pressure 10. Cardiac MRI in September of 2013 showed an ejection fraction of 34% with diffuse hypokinesis. There was no hyperenhancement or scar tissue and no evidence of cardiac hemochromatosis. There was moderate left atrial enlargement. TSH in September 2013 normal. Last echocardiogram in December 2015 showed an ejection fraction of 45%, moderate left atrial enlargement. 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 Patient subsequently had CRTP placed.    Admitted 06/21/14 for SOB and chest pain.  Echo with Echo 06/22/2014 EF 08-67%, grade diastolic dysfunction, PA peak pressure 62mmHg. No pericardial effusion. No obvious sign of microperferation after recent PPM- [employer] Jude device.  Nuc study with EF 35% no ischemia or infarct.   His coreg increased secondary to NSVT.  He was also scheduled for out pt sleep which was done in [facility] they are awaiting results.  He has seen Dr. Melvyn Novas and his ACE was stopped -now on valsartan.  He will have pulm. Function studies before next appt. With Dt. Wert.  Past Medical History  Diagnosis Date  . Polycythemia vera(238.4)   .  Back pain   . Hemochromatosis   . Gout   . Hypertension   . Nephrolithiasis   . Cardiomyopathy     Nonischemic 45%.   [name] Hypospadias 1950-09-16    born with    Past Surgical History  Procedure Laterality Date  . Ankle surgery    . Pilonidal cyst excision    . Tonsillectomy    . Bi-ventricular pacemaker insertion N/A 04/29/2014    Procedure: BI-VENTRICULAR PACEMAKER INSERTION (CRT-P);  Surgeon: Deboraha Sprang, MD;  Location: [facility] CATH LAB;  Service: Cardiovascular;  Laterality: N/A;     Current Outpatient Prescriptions  Medication Sig Dispense Refill  . anagrelide (AGRYLIN) 1 MG capsule Take 2 capsules a day alternating with 1 capsule a day. 60 capsule 6  . aspirin 81 MG tablet Take 81 mg by mouth daily.    . carvedilol (COREG) 25 MG tablet Take 1 tablet (25 mg total) by mouth 2 (two) times daily with a meal. 60 tablet 5  . colestipol (COLESTID) 1 G tablet Take 1 g by mouth 2 (two) times daily.    [name] dicyclomine (BENTYL) 20 MG tablet Take 20 mg by mouth 2 (two) times daily.    . febuxostat (ULORIC) 40 MG tablet Take 1 tablet (40 mg total) by mouth daily. 30 tablet 6  . isosorbide mononitrate (IMDUR) 30 MG 24 hr tablet Take 1 tablet (30 mg total) by mouth daily. 30 tablet 5  . omeprazole (PRILOSEC) 20 MG capsule Take 20 mg by mouth daily.     [name] oxymorphone (OPANA) 5 MG tablet Take 10  mg by mouth every 4 (four) hours as needed for pain. INCREASED TO 10 MG    . polyvinyl alcohol (LIQUIFILM TEARS) 1.4 % ophthalmic solution Place 1-2 drops into both eyes as needed for dry eyes.    . tamsulosin (FLOMAX) 0.4 MG CAPS capsule Take 1 capsule (0.4 mg total) by mouth as needed. (Patient taking differently: Take 0.4 mg by mouth daily. ) 30 capsule 6  . temazepam (RESTORIL) 22.5 MG capsule Take 1 capsule (22.5 mg total) by mouth at bedtime as needed for sleep. 30 capsule 2  . valsartan (DIOVAN) 80 MG tablet Take 1 tablet (80 mg total) by mouth daily. 30 tablet 11   No current facility-administered  medications for this visit.   Facility-Administered Medications Ordered in Other Visits  Medication Dose Route Frequency Provider Last Rate Last Dose  . 0.9 %  sodium chloride infusion   Intravenous Continuous Volanda Napoleon, MD   Stopped at 05/16/13 1115    Allergies:   Prednisone and Wellbutrin    Social History:  The patient  reports that he quit smoking about 15 months ago. His smoking use included Cigarettes. He started smoking about 46 years ago. He has a 44 pack-year smoking history. He has never used smokeless tobacco. He reports that he drinks alcohol. He reports that he does not use illicit drugs.   Family History:  The patient's family history includes Heart disease in his maternal grandmother.    ROS:  General:no colds or fevers, some weight decrease Skin:no rashes or ulcers HEENT:no blurred vision, no congestion CV:see HPI PUL:see HPI GI:+ episodic diarrhea for 6 months is being sen by GI.  No constipation or melena, no indigestion GU:no hematuria, no dysuria MS:no joint pain, no claudication Neuro:no syncope, no lightheadedness Endo:no diabetes, no thyroid disease  Wt Readings from Last 3 Encounters:  07/27/14 269 lb 3.2 oz (122.108 kg)  07/23/14 268 lb 6.4 oz (121.745 kg)  06/21/14 280 lb 3.3 oz (127.1 kg)     PHYSICAL EXAM: VS:  BP 110/68 mmHg  Pulse 68  Ht 5\' 9"  (1.753 m)  Wt 269 lb 3.2 oz (122.108 kg)  BMI 39.74 kg/m2 , BMI Body mass index is 39.74 kg/(m^2). General:Pleasant affect, NAD Skin:Warm and dry, brisk capillary refill HEENT:normocephalic, sclera clear, mucus membranes moist Neck:supple, no JVD, no bruits  Heart:S1S2 RRR without murmur, gallup, rub or click Lungs:clear without rales, rhonchi, or wheezes DUK:GURKY, soft, non tender, + BS, do not palpate liver spleen or masses Ext:no lower ext edema, 2+ pedal pulses, 2+ radial pulses Neuro:alert and oriented X 3, MAE, follows commands, + facial symmetry    EKG:  EKG is not ordered  today.  Recent Labs: 04/28/2014: TSH 0.424 06/17/2014: ALT 20 06/21/2014: B Natriuretic Peptide 225.0*; BUN 20; Creatinine 1.34; Hemoglobin 12.6*; Platelets 631*; Potassium 4.2; Sodium 140    Lipid Panel    Component Value Date/Time   CHOL 126 04/29/2014 0139   TRIG 146 04/29/2014 0139   HDL 29* 04/29/2014 0139   CHOLHDL 4.3 04/29/2014 0139   VLDL 29 04/29/2014 0139   LDLCALC 68 04/29/2014 0139       Other studies Reviewed: Additional studies/ records that were reviewed today include: office notes, labs, echo nuc.   ASSESSMENT AND PLAN:  Chest tightness along with cough, echo stable and nuc neg.for ischemia.  Has seen Dr. Melvyn Novas and his ACE has been stopped.  Valsartan added.  No changes yet.b brief episodesof pain at pacer site, not returned.  Congestive dilated  cardiomyopathy with EF on echo 50-55% but with nuc 35%.  nuc study was 2 day study. On valsartan now.   NSVT during hospitalization with increase of coreg prior to discharge no further episodes.     Pacemaker st jude to see Dr. Caryl Comes on Friday, encouraged to keep appt.     Polycythemia vera  Back pain  Tobacco use- discussed importance of stopping.      Dyspnea not much change yet after stopping ACE    Current medicines are reviewed with the patient today.  The patient Has no concerns regarding medicines.  The following changes have been made:  See above Labs/ tests ordered today include:see above  Disposition:   FU:  see above  Signed, Isaiah Serge, NP  07/27/2014 2:40 PM    [facility] [place], [place], [place], [place] Phone: [phone]; Fax: [phone]

## 2014-07-27 NOTE — Patient Instructions (Signed)
Cecilie Kicks, NP, recommends that you schedule a follow-up appointment FIRST AVAILABLE with Dr Stanford Breed.  Please keep your appointment, 07/31/2014 at [facility], with Dr Caryl Comes.

## 2014-07-29 ENCOUNTER — Encounter: Payer: Self-pay | Admitting: Cardiology

## 2014-07-30 ENCOUNTER — Ambulatory Visit (HOSPITAL_BASED_OUTPATIENT_CLINIC_OR_DEPARTMENT_OTHER): Payer: 59 | Admitting: Hematology & Oncology

## 2014-07-30 ENCOUNTER — Other Ambulatory Visit (HOSPITAL_BASED_OUTPATIENT_CLINIC_OR_DEPARTMENT_OTHER): Payer: 59

## 2014-07-30 ENCOUNTER — Ambulatory Visit: Payer: Self-pay

## 2014-07-30 ENCOUNTER — Encounter: Payer: Self-pay | Admitting: Hematology & Oncology

## 2014-07-30 VITALS — BP 138/64 | HR 76 | Temp 98.1°F | Resp 20 | Ht 70.0 in | Wt 272.0 lb

## 2014-07-30 DIAGNOSIS — D45 Polycythemia vera: Secondary | ICD-10-CM

## 2014-07-30 LAB — CBC WITH DIFFERENTIAL (CANCER CENTER ONLY)
BASO#: 0.2 10*3/uL (ref 0.0–0.2)
BASO%: 2.1 % — ABNORMAL HIGH (ref 0.0–2.0)
EOS%: 3.2 % (ref 0.0–7.0)
Eosinophils Absolute: 0.3 10*3/uL (ref 0.0–0.5)
HCT: 42.6 % (ref 38.7–49.9)
HGB: 13.1 g/dL (ref 13.0–17.1)
LYMPH#: 1.9 10*3/uL (ref 0.9–3.3)
LYMPH%: 19.5 % (ref 14.0–48.0)
MCH: 23.3 pg — ABNORMAL LOW (ref 28.0–33.4)
MCHC: 30.8 g/dL — ABNORMAL LOW (ref 32.0–35.9)
MCV: 76 fL — ABNORMAL LOW (ref 82–98)
MONO#: 1 10*3/uL — ABNORMAL HIGH (ref 0.1–0.9)
MONO%: 9.6 % (ref 0.0–13.0)
NEUT#: 6.5 10*3/uL (ref 1.5–6.5)
NEUT%: 65.6 % (ref 40.0–80.0)
Platelets: 668 10*3/uL — ABNORMAL HIGH (ref 145–400)
RBC: 5.63 10*6/uL (ref 4.20–5.70)
RDW: 20.1 % — ABNORMAL HIGH (ref 11.1–15.7)
WBC: 9.9 10*3/uL (ref 4.0–10.0)

## 2014-07-30 LAB — COMPREHENSIVE METABOLIC PANEL
ALT: 15 U/L (ref 0–53)
AST: 17 U/L (ref 0–37)
Albumin: 3.8 g/dL (ref 3.5–5.2)
Alkaline Phosphatase: 81 U/L (ref 39–117)
BUN: 21 mg/dL (ref 6–23)
CO2: 21 mEq/L (ref 19–32)
Calcium: 9 mg/dL (ref 8.4–10.5)
Chloride: 109 mEq/L (ref 96–112)
Creatinine, Ser: 1.24 mg/dL (ref 0.50–1.35)
Glucose, Bld: 84 mg/dL (ref 70–99)
Potassium: 5.1 mEq/L (ref 3.5–5.3)
Sodium: 139 mEq/L (ref 135–145)
Total Bilirubin: 0.5 mg/dL (ref 0.2–1.2)
Total Protein: 6.2 g/dL (ref 6.0–8.3)

## 2014-07-30 LAB — LACTATE DEHYDROGENASE: LDH: 229 U/L (ref 94–250)

## 2014-07-30 NOTE — Progress Notes (Signed)
Hematology and Oncology Follow Up Visit  Enrico Eaddy [id number] 14-May-1950 64 y.o. 07/30/2014   Principle Diagnosis:   Polycythemia vera-JAK2 positive  Heart block-Mobitz II  Current Therapy:    Anagrelide 1 mg by mouth daily alternating with 2 mg a day  Aspirin 81 mg by mouth daily  Phlebotomy to maintain hematocrit below 45%     Interim History:  Mr.  Slone is back for followup. He is doing fairly well. He has not been phlebotomized since September. He is definitely iron deficient. Back in January, his ferritin was only 20 with an iron saturation 5%.  His back seems to be doing a low bit better. He is exercising. He is going to the gym. He does not have to see the neurosurgeon anymore.  His main problems are associated with diarrhea. He says he can go 5-6 times a day. Nothing really specifically triggers the diarrhea. He certainly has not traveled anywhere recently.  He's had no bleeding. He's had no fever. He's had no abdominal pain. He's had no leg swelling.    Medications:  Current outpatient prescriptions:  .  anagrelide (AGRYLIN) 1 MG capsule, Take 2 capsules a day alternating with 1 capsule a day., Disp: 60 capsule, Rfl: 6 .  aspirin 81 MG tablet, Take 81 mg by mouth daily., Disp: , Rfl:  .  carvedilol (COREG) 25 MG tablet, Take 1 tablet (25 mg total) by mouth 2 (two) times daily with a meal., Disp: 60 tablet, Rfl: 5 .  colestipol (COLESTID) 1 G tablet, Take 1 g by mouth 2 (two) times daily., Disp: , Rfl:  .  dicyclomine (BENTYL) 20 MG tablet, Take 20 mg by mouth 2 (two) times daily., Disp: , Rfl:  .  febuxostat (ULORIC) 40 MG tablet, Take 1 tablet (40 mg total) by mouth daily., Disp: 30 tablet, Rfl: 6 .  omeprazole (PRILOSEC) 20 MG capsule, Take 20 mg by mouth daily. , Disp: , Rfl:  .  oxymorphone (OPANA) 5 MG tablet, Take 10 mg by mouth every 4 (four) hours as needed for pain. INCREASED TO 10 MG, Disp: , Rfl:  .  polyvinyl alcohol (LIQUIFILM TEARS) 1.4 %  ophthalmic solution, Place 1-2 drops into both eyes as needed for dry eyes., Disp: , Rfl:  .  tamsulosin (FLOMAX) 0.4 MG CAPS capsule, Take 1 capsule (0.4 mg total) by mouth as needed. (Patient taking differently: Take 0.4 mg by mouth daily. ), Disp: 30 capsule, Rfl: 6 .  temazepam (RESTORIL) 22.5 MG capsule, Take 1 capsule (22.5 mg total) by mouth at bedtime as needed for sleep., Disp: 30 capsule, Rfl: 2 .  valsartan (DIOVAN) 80 MG tablet, Take 1 tablet (80 mg total) by mouth daily., Disp: 30 tablet, Rfl: 11 .  isosorbide mononitrate (IMDUR) 30 MG 24 hr tablet, Take 1 tablet (30 mg total) by mouth daily. (Patient not taking: Reported on 07/30/2014), Disp: 30 tablet, Rfl: 5 No current facility-administered medications for this visit.  Facility-Administered Medications Ordered in Other Visits:  .  0.9 %  sodium chloride infusion, , Intravenous, Continuous, Volanda Napoleon, MD, Stopped at 05/16/13 1115  Allergies:  Allergies  Allergen Reactions  . Prednisone Other (See Comments)    Abdominal pain  . Wellbutrin [Bupropion] Hives    Past Medical History, Surgical history, Social history, and Family History were reviewed and updated.  Review of Systems: As above  Physical Exam:  height is 5' 10"  (1.778 m) and weight is 272 lb (123.378 kg). His oral temperature is  98.1 F (36.7 C). His blood pressure is 138/64 and his pulse is 76. His respiration is 20.   Obese white male in no obvious distress. There is no adenopathy in the neck. Lungs are clear. Cardiac exam regular rate and rhythm. He's an occasional extra beat. He has no murmurs, rubs or bruits. Abdomen is obese but soft. Has good bowel sounds. There is no fluid wave. There is a palpable liver or spleen tip. Back exam shows a well healed laminectomy scar in the lumbar spine. Extremities shows no clubbing cyanosis or edema. He has good range of motion of his joints. He has good strength. Skin exam no rashes, ecchymoses or petechia.  Neurological exam is nonfocal.  Lab Results  Component Value Date   WBC 9.9 07/30/2014   HGB 13.1 07/30/2014   HCT 42.6 07/30/2014   MCV 76* 07/30/2014   PLT 668* 07/30/2014     Chemistry      Component Value Date/Time   NA 140 06/21/2014 1236   NA 141 06/17/2014 0939   K 4.2 06/21/2014 1236   K 4.2 06/17/2014 0939   CL 106 06/21/2014 1236   CL 108 06/17/2014 0939   CO2 25 06/21/2014 1236   CO2 25 06/17/2014 0939   BUN 20 06/21/2014 1236   BUN 17 06/17/2014 0939   CREATININE 1.34 06/21/2014 1642   CREATININE 1.2 06/17/2014 0939      Component Value Date/Time   CALCIUM 8.7 06/21/2014 1236   CALCIUM 8.8 06/17/2014 0939   ALKPHOS 74 06/17/2014 0939   ALKPHOS 79 04/28/2014 1026   AST 28 06/17/2014 0939   AST 27 04/28/2014 1026   ALT 20 06/17/2014 0939   ALT 26 04/28/2014 1026   BILITOT 0.70 06/17/2014 0939   BILITOT 0.7 04/28/2014 1026         Impression and Plan: Mr. Amico is 64 year old gentleman with polycythemia vera. He does not need be phlebotomized today.  His platelet count is holding steady.. I will not adjust his anagrelide dose . We can certainly make adjustments down the road.  I'm glad that his cardiac status is doing okay. The patient's family that he had placed, back in January, certainly has helped.  I am still not sure why he is having the diarrhea. I think that it might be his medicines but his heart assay which medicine it is. Unfortunately, is hard for him to get to gastroenterology because of Workmen's Comp. issues.  I spent about 30 minutes with he and his wife today.   I will plan to get him back to see him in another 3 months Volanda Napoleon, MD 3/24/20161:32 PM

## 2014-07-30 NOTE — Progress Notes (Signed)
No phlebotomy per dr. Marin Olp

## 2014-07-31 ENCOUNTER — Encounter: Payer: Self-pay | Admitting: Internal Medicine

## 2014-07-31 ENCOUNTER — Ambulatory Visit (INDEPENDENT_AMBULATORY_CARE_PROVIDER_SITE_OTHER): Payer: 59 | Admitting: Internal Medicine

## 2014-07-31 VITALS — BP 130/70 | HR 79 | Ht 70.0 in | Wt 270.0 lb

## 2014-07-31 DIAGNOSIS — T82110A Breakdown (mechanical) of cardiac electrode, initial encounter: Secondary | ICD-10-CM

## 2014-07-31 DIAGNOSIS — Z45018 Encounter for adjustment and management of other part of cardiac pacemaker: Secondary | ICD-10-CM

## 2014-07-31 DIAGNOSIS — I441 Atrioventricular block, second degree: Secondary | ICD-10-CM

## 2014-07-31 DIAGNOSIS — I420 Dilated cardiomyopathy: Secondary | ICD-10-CM | POA: Diagnosis not present

## 2014-07-31 DIAGNOSIS — I442 Atrioventricular block, complete: Secondary | ICD-10-CM

## 2014-07-31 HISTORY — DX: Breakdown (mechanical) of cardiac electrode, initial encounter: T82.110A

## 2014-07-31 LAB — MDC_IDC_ENUM_SESS_TYPE_INCLINIC
Battery Remaining Longevity: 76.8 mo
Battery Voltage: 2.99 V
Brady Statistic RA Percent Paced: 1.7 %
Brady Statistic RV Percent Paced: 99 %
Date Time Interrogation Session: 20160325093043
Implantable Pulse Generator Model: 3242
Implantable Pulse Generator Serial Number: 7701275
Lead Channel Impedance Value: 462.5 Ohm
Lead Channel Impedance Value: 462.5 Ohm
Lead Channel Impedance Value: 637.5 Ohm
Lead Channel Pacing Threshold Amplitude: 0.75 V
Lead Channel Pacing Threshold Amplitude: 0.875 V
Lead Channel Pacing Threshold Amplitude: 1.75 V
Lead Channel Pacing Threshold Pulse Width: 0.4 ms
Lead Channel Pacing Threshold Pulse Width: 0.4 ms
Lead Channel Pacing Threshold Pulse Width: 0.4 ms
Lead Channel Sensing Intrinsic Amplitude: 4.7 mV
Lead Channel Sensing Intrinsic Amplitude: 6 mV
Lead Channel Setting Pacing Amplitude: 1.75 V
Lead Channel Setting Pacing Amplitude: 2 V
Lead Channel Setting Pacing Amplitude: 2.75 V
Lead Channel Setting Pacing Pulse Width: 0.4 ms
Lead Channel Setting Pacing Pulse Width: 0.4 ms
Lead Channel Setting Sensing Sensitivity: 2 mV

## 2014-07-31 NOTE — Progress Notes (Signed)
Electrophysiology Office Note   Date:  07/31/2014   ID:  William Villanueva, DOB 28-Jun-1950, MRN [id number]  PCP:  Gara Kroner, MD  Cardiologist:  [facility] Primary Electrophysiologist:    [name], MD    No chief complaint on file.    History of Present Illness: William Villanueva is a 64 y.o. male is   seen in follow-up for CRT-P implantation for nonischemic cardiomyopathy. He had presented to 12/15 with symptomatic high-grade heart block. Echo reevaluation 2/16 demonstrated near normalization of LV systolic function; he been readmitted at that time because of pain which dated back to his device implantation.  Today, he denies symptoms of palpitations, chest pain, shortness of breath, orthopnea, PND, lower extremity edema, claudication, dizziness, presyncope, syncope, bleeding, or neurologic sequela. The patient is tolerating medications without difficulties and is otherwise without complaint today. I should note   Past Medical History  Diagnosis Date  . Polycythemia vera(238.4)   . Back pain   . Hemochromatosis   . Gout   . Hypertension   . Nephrolithiasis   . Cardiomyopathy     Nonischemic 45%.   [name] Hypospadias 1951/01/05    born with   Past Surgical History  Procedure Laterality Date  . Ankle surgery    . Pilonidal cyst excision    . Tonsillectomy    . Bi-ventricular pacemaker insertion N/A 04/29/2014    Procedure: BI-VENTRICULAR PACEMAKER INSERTION (CRT-P);  Surgeon: Deboraha Sprang, MD;  Location: [facility] CATH LAB;  Service: Cardiovascular;  Laterality: N/A;     Current Outpatient Prescriptions  Medication Sig Dispense Refill  . anagrelide (AGRYLIN) 1 MG capsule Take 2 capsules a day alternating with 1 capsule a day. 60 capsule 6  . aspirin 81 MG tablet Take 81 mg by mouth daily.    . carvedilol (COREG) 25 MG tablet Take 1 tablet (25 mg total) by mouth 2 (two) times daily with a meal. 60 tablet 5  . colestipol (COLESTID) 1 G tablet Take 1 g by mouth 2 (two) times daily.     [name] dicyclomine (BENTYL) 20 MG tablet Take 20 mg by mouth 2 (two) times daily.    . febuxostat (ULORIC) 40 MG tablet Take 1 tablet (40 mg total) by mouth daily. 30 tablet 6  . isosorbide mononitrate (IMDUR) 30 MG 24 hr tablet Take 1 tablet (30 mg total) by mouth daily. 30 tablet 5  . omeprazole (PRILOSEC) 20 MG capsule Take 20 mg by mouth daily.     [name] oxymorphone (OPANA) 5 MG tablet Take 10 mg by mouth every 4 (four) hours as needed for pain. INCREASED TO 10 MG    . polyvinyl alcohol (LIQUIFILM TEARS) 1.4 % ophthalmic solution Place 1-2 drops into both eyes as needed for dry eyes.    . tamsulosin (FLOMAX) 0.4 MG CAPS capsule Take 1 capsule (0.4 mg total) by mouth as needed. (Patient taking differently: Take 0.4 mg by mouth daily. ) 30 capsule 6  . temazepam (RESTORIL) 22.5 MG capsule Take 1 capsule (22.5 mg total) by mouth at bedtime as needed for sleep. 30 capsule 2  . valsartan (DIOVAN) 80 MG tablet Take 1 tablet (80 mg total) by mouth daily. 30 tablet 11   No current facility-administered medications for this visit.   Facility-Administered Medications Ordered in Other Visits  Medication Dose Route Frequency Provider Last Rate Last Dose  . 0.9 %  sodium chloride infusion   Intravenous Continuous Volanda Napoleon, MD   Stopped at 05/16/13 1115  Allergies:   Prednisone and Wellbutrin   Social History:  The patient  reports that he quit smoking about 15 months ago. His smoking use included Cigarettes. He started smoking about 46 years ago. He has a 44 pack-year smoking history. He has never used smokeless tobacco. He reports that he drinks alcohol. He reports that he does not use illicit drugs.   Family History:  The patient's    family history includes Heart disease in his maternal grandmother.    ROS:  Please see the history of present illness and past medical history  Otherwise, all other systems were reviewed and were negative except .     PHYSICAL EXAM: VS:  BP 130/70 mmHg  Pulse 79   Ht 5' 10"  (1.778 m)  Wt 270 lb (122.471 kg)  BMI 38.74 kg/m2  SpO2 99% , BMI Body mass index is 38.74 kg/(m^2). GEN: Well nourished, well developed, in no acute distress HEENT: normal Neck:  JVD flat, carotid bruits, or masses Cardiac: REGULAR RATE and RHYTHM ;  No murmurs, rubs, + S4  Back without kyphosis; No CVAT Respiratory:  clear to auscultation bilaterally, normal work of breathing GI: soft, nontender, nondistended, + BS MS: no deformity or atrophy Extremities no clubbing cyanosis  edema Skin: warm and dry,  device pocket is well healed without teathering Neuro:  Strength and sensation are intact Psych: euthymic mood, full affect  EKG:  EKG is ordered tod  Device interrogation is reviewed today in detail.  See PaceArt for details.   Recent Labs: 04/28/2014: TSH 0.424 06/21/2014: B Natriuretic Peptide 225.0* 07/30/2014: ALT 15; BUN 21; Creatinine 1.24; Hemoglobin 13.1; Platelets 668*; Potassium 5.1; Sodium 139    Lipid Panel     Component Value Date/Time   CHOL 126 04/29/2014 0139   TRIG 146 04/29/2014 0139   HDL 29* 04/29/2014 0139   CHOLHDL 4.3 04/29/2014 0139   VLDL 29 04/29/2014 0139   LDLCALC 68 04/29/2014 0139     Wt Readings from Last 3 Encounters:  07/31/14 270 lb (122.471 kg)  07/30/14 272 lb (123.378 kg)  07/27/14 269 lb 3.2 oz (122.108 kg)      Other studies Reviewed: Additional studies/ records that were reviewed today include Dr Morrison Old notes describing the discontinuation of ACE and substitiution with ARB  ASSESSMENT AND PLAN:  NICM  Resolved cardiomyopathy  Hypertension  Pacemaker lead issue  Pacemaker-CRT-[employer] Jude  We will recheck his RV lead in a few months. We have reprogrammed his device so as to stay below the 2.5 coupler We'll continue his other medications.   Current medicines are reviewed at length with the patient today.   The patient has concerns regarding his medicines.  The following changes were made today:  But I did  review with him the ACE inhibitor--ARB switch  Labs/ tests ordered today include:  No orders of the defined types were placed in this encounter.     Disposition:   FU with AS 9 month(s)  Signed, [name], MD  07/31/2014 9:02 AM     [facility] [address] [place] [address] [phone] (office) [phone] (fax)

## 2014-07-31 NOTE — Patient Instructions (Signed)
Your physician recommends that you continue on your current medications as directed. Please refer to the Current Medication list given to you today.  Remote monitoring is used to monitor your Pacemaker of ICD from home. This monitoring reduces the number of office visits required to check your device to one time per year. It allows [place] to keep an eye on the functioning of your device to ensure it is working properly. You are scheduled for a device check from home on 11/02/14. You may send your transmission at any time that day. If you have a wireless device, the transmission will be sent automatically. After your physician reviews your transmission, you will receive a postcard with your next transmission date.  Your physician wants you to follow-up in: 9 months with Dr. Caryl Comes.  You will receive a reminder letter in the mail two months in advance. If you don't receive a letter, please call our office to schedule the follow-up appointment.

## 2014-08-28 ENCOUNTER — Encounter: Payer: Self-pay | Admitting: Internal Medicine

## 2014-08-28 ENCOUNTER — Ambulatory Visit (INDEPENDENT_AMBULATORY_CARE_PROVIDER_SITE_OTHER): Payer: 59 | Admitting: Internal Medicine

## 2014-08-28 VITALS — BP 120/74 | HR 75 | Ht 70.0 in | Wt 277.0 lb

## 2014-08-28 DIAGNOSIS — R0600 Dyspnea, unspecified: Secondary | ICD-10-CM | POA: Diagnosis not present

## 2014-08-28 LAB — PULMONARY FUNCTION TEST
DL/VA % pred: 79 %
DL/VA: 3.68 ml/min/mmHg/L
DLCO unc % pred: 61 %
DLCO unc: 19.84 ml/min/mmHg
FEF 25-75 Post: 2.1 L/sec
FEF 25-75 Pre: 1.5 L/sec
FEF2575-%Change-Post: 39 %
FEF2575-%Pred-Post: 74 %
FEF2575-%Pred-Pre: 53 %
FEV1-%Change-Post: 7 %
FEV1-%Pred-Post: 68 %
FEV1-%Pred-Pre: 63 %
FEV1-Post: 2.39 L
FEV1-Pre: 2.23 L
FEV1FVC-%Change-Post: 7 %
FEV1FVC-%Pred-Pre: 96 %
FEV6-%Change-Post: 0 %
FEV6-%Pred-Post: 69 %
FEV6-%Pred-Pre: 68 %
FEV6-Post: 3.07 L
FEV6-Pre: 3.05 L
FEV6FVC-%Change-Post: 1 %
FEV6FVC-%Pred-Post: 105 %
FEV6FVC-%Pred-Pre: 104 %
FVC-%Change-Post: 0 %
FVC-%Pred-Post: 65 %
FVC-%Pred-Pre: 66 %
FVC-Post: 3.08 L
FVC-Pre: 3.09 L
Post FEV1/FVC ratio: 78 %
Post FEV6/FVC ratio: 100 %
Pre FEV1/FVC ratio: 72 %
Pre FEV6/FVC Ratio: 99 %

## 2014-08-28 NOTE — Patient Instructions (Addendum)
You do not have copd and you never will   The only prolem we can detect is that  your lungs are too small due to effects of your  belly pushing up on your lungs (also contributes to sleep issues)  Weight control is simply a matter of calorie balance which needs to be tilted in your favor by eating less and exercising more.  To get the most out of exercise, you need to be continuously aware that you are short of breath, but never out of breath, for 30 minutes daily. As you improve, it will actually be easier for you to do the same amount of exercise  in  30 minutes so always push to the level where you are short of breath.  If this does not result in gradual weight reduction then I strongly recommend you see a nutritionist with a food diary x 2 weeks so that we can work out a negative calorie balance which is universally effective in steady weight loss programs.  Think of your calorie balance like you do your bank account where in this case you want the balance to go down so you must take in less calories than you burn up.  It's just that simple:  Hard to do, but easy to understand.  Good luck!

## 2014-08-28 NOTE — Progress Notes (Signed)
PFT done today. 

## 2014-08-28 NOTE — Progress Notes (Signed)
Subjective:     Patient ID: William Villanueva, male   DOB: 12/06/1950,    MRN: [id number]     Brief patient profile:  64 yowm quit smoking  Oct 2014 at wt 300 due to cost and ? Hurting in chest but  breathing ok s inhalers needed until gradually worsened since Dec 2015 referred by William Villanueva for unexplained sob p admit but pfts s airflow obst 08/28/2014    Admit date: 06/21/2014 Discharge date: 06/23/2014  Primary Care Provider: Antony Contras Villanueva Primary Cardiologist: Dr. Kirk Villanueva  Discharge Diagnoses Principal Problem:  Chest tightness Active Problems:  Polycythemia vera  Back pain  Tobacco use  Congestive dilated cardiomyopathy  Pacemaker  Dyspnea   Allergies Allergies  Allergen Reactions  . Prednisone Other (See Comments)    Abdominal pain  . Wellbutrin [Bupropion] Hives    Procedures  Echocardiogram 06/22/2014 - Left ventricle: The cavity size was mildly dilated. Wall thickness was normal. Systolic function was normal. The estimated ejection fraction was in the range of 50% to 55%. Doppler parameters are consistent with abnormal left ventricular relaxation (grade 1 diastolic dysfunction). - Left atrium: The atrium was moderately dilated. - Pulmonary arteries: PA peak pressure: 38 mm Hg (S).  Impressions:  - Poor acoustic windows, even with echo contrast, limit study     2 day stress test 06/23/2014 IMPRESSION: 1. No reversible ischemia or infarction.  2. Normal left ventricular wall motion.  3. Left ventricular ejection fraction 35%  4. Intermediate-risk stress test findings* secondary to reduced ejection fraction of 35%. Otherwise there is no ischemia identified. Personally reviewed echocardiogram which appears to have reduced ejection fraction in the 40% range.    Villanueva Course  The patient is a 64 year old male with past medical history of polycythemia vera, hypertension, history of nonischemic cardiomyopathy with  baseline EF 45%. His last cardiac catheterization in 2013 showed mild nonobstructive CAD. He had placement of biventricular pacemaker in December 2015. Patient presented to William Villanueva on 06/21/2014. According to him, ever since pacemaker placement, he has been having shortness breath and episodic chest discomfort. He was admitted to cardiology service, serial troponin when has been negative. CTA was negative for PE or pneumothorax, was noted he had a small amount of pericardial fluid, however and no active pulmonary disease. Echocardiogram was obtained on 06/22/2014 which showed EF 01-02%, grade 1 diastolic dysfunction, PA peak pressure 38. No pericardial effusion was noted on echocardiogram. There was no obvious sign of microperforation after recently pacemaker placement. Given his persistent symptoms, 2 day lexiscan stress test was obtained which came back on 06/23/2014. The stress test shows EF 35%, normal left ventricular wall motion, no reversible ischemia or infarction.   Patient was seen in the morning of 2/16, at which time he denies any obvious chest pain. He is deemed stable for discharge from cardiology perspective. During this admission, his carvedilol was increased to 25 mg twice a day for Villanueva rate control. Imdur was added to help with chest pain. Patient will need a outpatient sleep study which her wife has already scheduled for him at the beginning of March.         07/23/2014 1st William Villanueva/ William Villanueva  / on lisinopril 5 mg daily  Chief Complaint  Patient presents with  . Pulmonary Consult    Referred by Dr. Moreen Villanueva. Pt c/o SOB and cough since Dec 2015. He states that he wakes up in the night gasping for air and feels SOB occ during the day.  He is coughing up large amounts of sputum- normally clear, but yellow for the past 2 days.   since Feb 6th some Villanueva   ?  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 Follow-up    PFT done today. Pt states that his cough has resolved. His breathing has started to improve. He was started on CPAP approx 1 wk ago per Dr. Maxie Villanueva in [place].      Still sob "across a parking lot"  No obvious day to day or daytime variabilty or assoc cp or chest tightness, subjective wheeze overt sinus or hb symptoms. No unusual exp hx or h/o childhood pna/ asthma or knowledge of premature birth.  Sleeping ok without nocturnal  or early am exacerbation  of respiratory  c/o's or need for noct saba. Also denies any obvious fluctuation of symptoms with weather or environmental changes or other aggravating or alleviating factors except as outlined above   Current Medications, Allergies, Complete Past Medical History, Past Surgical History, Family History, and Social History were reviewed in [employer] record.  ROS  The following are not active complaints unless bolded sore throat, dysphagia, dental problems, itching, sneezing,  nasal congestion or excess/ purulent secretions, ear ache,   fever, chills, sweats, unintended wt loss, pleuritic or exertional cp, hemoptysis,  orthopnea pnd or leg swelling, presyncope, palpitations, heartburn, abdominal pain, anorexia, nausea, vomiting, diarrhea  or change in bowel or urinary habits, change in stools or urine, dysuria,hematuria,  rash, arthralgias, visual complaints, headache, numbness weakness or ataxia or problems with walking or coordination,  change in mood/affect or memory.               Objective:   Physical Exam    amb wm withmild hoarseness/    pseudowheeze has resolved   08/28/2014        277  Wt Readings from Last 3  Encounters:  07/23/14 268 lb 6.4 oz (121.745 kg)  06/21/14 280 lb 3.3 oz (127.1 kg)  06/17/14 278 lb (126.1 kg)    Vital signs reviewed   HEENT: nl dentition, turbinates, and orophanx. Nl external ear canals without cough reflex   NECK :  without JVD/Nodes/TM/ nl carotid upstrokes bilaterally   LUNGS: no acc muscle use, clear to A and P bilaterally without cough on insp or exp maneuvers   CV:  RRR  no s3 or murmur or increase in P2, no edema   ABD:  soft and nontender with nl excursion in the supine position. No bruits or organomegaly, bowel sounds nl  MS:  warm without deformities, calf tenderness, cyanosis or clubbing  SKIN: warm and dry without lesions    NEURO:  alert, approp, no deficits     I personally reviewed images and agree with radiology impression as follows:  CTa chest   06/21/14 No demonstrable pulmonary emboli. Central vessels are clear. Distal vessels are not well seen because of motion and only moderate opacification. No active pulmonary disease. Coronary artery calcification. Small amount of pericardial fluid.   Assessment:

## 2014-08-29 ENCOUNTER — Encounter: Payer: Self-pay | Admitting: Internal Medicine

## 2014-08-29 NOTE — Assessment & Plan Note (Signed)
-   off acei 07/23/2014 > wheezing resolved - PFT's 08/28/14 mild restrictive changes with nl dlco p correction for alv vol  - 08/28/2014   Walked RA x 3 laps @ 124f each  stopped due to  Back pain, nl pace, no desat or sob  I had an extended final summary discussion with the patient reviewing all relevant studies completed to date and  lasting 15 to 20 minutes of a 25 minute visit on the following ongoing concerns:   1) he does not have copd and never will   2) his problems are  almost entirely wt related including now his back complications but needs to seek a way to get in and stay in neg cal balance  3) no pulmonary rx needed at this point > f/u prn

## 2014-08-30 NOTE — Progress Notes (Signed)
HPI: FU cardiomyopathy. Echocardiogram in August of 2013 showed an ejection fraction of 35-40%. Cardiac catheterization in September of 2013 showed mild nonobstructive coronary disease. There was no hemodynamic evidence of restriction. Pulmonary capillary wedge pressure 10. Cardiac MRI in September of 2013 showed an ejection fraction of 34% with diffuse hypokinesis. There was no hyperenhancement or scar tissue and no evidence of cardiac hemochromatosis. TSH in September 2013 normal. 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 Patient subsequently had CRTP placed. Admitted 2/16 with CP and ruled out. Echo 2/16 showed EF 75-10, grade 1 diastolic dysfunction, moderate LAE. Nuclear study 2/16 showed EF 35, no ischemia. CTA 2/16 showed no PE. Since last seen, he has occasional dyspnea. No chest pain, palpitations or syncope. Some pain around pacemaker.  Current Outpatient Prescriptions  Medication Sig Dispense Refill  . anagrelide (AGRYLIN) 1 MG capsule Take 2 capsules a day alternating with 1 capsule a day. 60 capsule 6  . aspirin 81 MG tablet Take 81 mg by mouth daily.    . carvedilol (COREG) 25 MG tablet Take 1 tablet (25 mg total) by mouth 2 (two) times daily with a meal. 60 tablet 5  . colestipol (COLESTID) 1 G tablet Take 1 g by mouth 2 (two) times daily.    [name] dicyclomine (BENTYL) 20 MG tablet Take 20 mg by mouth 2 (two) times daily.    . febuxostat (ULORIC) 40 MG tablet Take 1 tablet (40 mg total) by mouth daily. 30 tablet 6  . isosorbide mononitrate (IMDUR) 30 MG 24 hr tablet Take 1 tablet (30 mg total) by mouth daily. 30 tablet 5  . omeprazole (PRILOSEC) 20 MG capsule Take 20 mg by mouth daily.     . polyvinyl alcohol (LIQUIFILM TEARS) 1.4 % ophthalmic solution Place 1-2 drops into both eyes as needed for dry eyes.    . tamsulosin (FLOMAX) 0.4 MG CAPS capsule Take 1 capsule (0.4 mg total) by mouth as needed. (Patient taking differently: Take 0.4 mg by mouth daily. ) 30  capsule 6  . temazepam (RESTORIL) 22.5 MG capsule Take 1 capsule (22.5 mg total) by mouth at bedtime as needed for sleep. 30 capsule 2  . valsartan (DIOVAN) 80 MG tablet Take 1 tablet (80 mg total) by mouth daily. 30 tablet 11   No current facility-administered medications for this visit.   Facility-Administered Medications Ordered in Other Visits  Medication Dose Route Frequency Provider Last Rate Last Dose  . 0.9 %  sodium chloride infusion   Intravenous Continuous Volanda Napoleon, MD   Stopped at 05/16/13 1115     Past Medical History  Diagnosis Date  . Polycythemia vera(238.4)   . Back pain   . Hemochromatosis   . Gout   . Hypertension   . Nephrolithiasis   . Cardiomyopathy     Nonischemic 45%.   [name] Hypospadias 19-Aug-1950    born with    Past Surgical History  Procedure Laterality Date  . Ankle surgery    . Pilonidal cyst excision    . Tonsillectomy    . Bi-ventricular pacemaker insertion N/A 04/29/2014    Procedure: BI-VENTRICULAR PACEMAKER INSERTION (CRT-P);  Surgeon: Deboraha Sprang, MD;  Location: [facility] CATH LAB;  Service: Cardiovascular;  Laterality: N/A;    History   Social History  . Marital Status: Married    Spouse Name: N/A  . Number of Children: N/A  . Years of Education: N/A   Occupational History  . Not on  file.   Social History Main Topics  . Smoking status: Former Smoker -- 1.00 packs/day for 44 years    Types: Cigarettes    Start date: 06/05/1968    Quit date: 04/05/2013  . Smokeless tobacco: Never Used     Comment: quit 2 years ago  . Alcohol Use: 0.0 oz/week    0 Standard drinks or equivalent per week     Comment: Occasional  . Drug Use: No  . Sexual Activity: Not on file   Other Topics Concern  . Not on file   Social History Narrative   Lives at home with wife.     ROS: no fevers or chills, productive cough, hemoptysis, dysphasia, odynophagia, melena, hematochezia, dysuria, hematuria, rash, seizure activity, orthopnea, PND, pedal  edema, claudication. Remaining systems are negative.  Physical Exam: Well-developed obese in no acute distress.  Skin is warm and dry.  HEENT is normal.  Neck is supple.  Chest is clear to auscultation with normal expansion. Pacemaker left chest Cardiovascular exam is regular rate and rhythm.  Abdominal exam nontender or distended. No masses palpated. Extremities show no edema. neuro grossly intact

## 2014-09-02 ENCOUNTER — Ambulatory Visit (INDEPENDENT_AMBULATORY_CARE_PROVIDER_SITE_OTHER): Payer: 59 | Admitting: Cardiology

## 2014-09-02 ENCOUNTER — Encounter: Payer: Self-pay | Admitting: Cardiology

## 2014-09-02 VITALS — BP 110/62 | HR 88 | Ht 70.0 in | Wt 274.4 lb

## 2014-09-02 DIAGNOSIS — Z950 Presence of cardiac pacemaker: Secondary | ICD-10-CM

## 2014-09-02 DIAGNOSIS — I420 Dilated cardiomyopathy: Secondary | ICD-10-CM | POA: Diagnosis not present

## 2014-09-02 DIAGNOSIS — Q6100 Congenital renal cyst, unspecified: Secondary | ICD-10-CM

## 2014-09-02 DIAGNOSIS — N281 Cyst of kidney, acquired: Secondary | ICD-10-CM

## 2014-09-02 NOTE — Assessment & Plan Note (Signed)
In reviewing his previous records today there is note of complex cysts on previous CT scan. I have asked him to follow-up with his primary care to see if this needs additional imaging.

## 2014-09-02 NOTE — Assessment & Plan Note (Signed)
Followed by electrophysiology. 

## 2014-09-02 NOTE — Assessment & Plan Note (Signed)
Improved on most recent echocardiogram. Continue ARB and beta blocker.

## 2014-09-02 NOTE — Patient Instructions (Signed)
Your physician wants you to follow-up in: 6 MONTHS WITH DR CRENSHAW You will receive a reminder letter in the mail two months in advance. If you don't receive a letter, please call our office to schedule the follow-up appointment.  

## 2014-09-14 ENCOUNTER — Other Ambulatory Visit: Payer: Self-pay | Admitting: Family Medicine

## 2014-09-14 ENCOUNTER — Ambulatory Visit
Admission: RE | Admit: 2014-09-14 | Discharge: 2014-09-14 | Disposition: A | Discharge status: Home / Self Care | Payer: 59 | Source: Ambulatory Visit | Attending: Family Medicine | Admitting: Family Medicine

## 2014-09-14 DIAGNOSIS — J209 Acute bronchitis, unspecified: Secondary | ICD-10-CM

## 2014-10-12 ENCOUNTER — Ambulatory Visit
Admission: RE | Admit: 2014-10-12 | Discharge: 2014-10-12 | Disposition: A | Discharge status: Home / Self Care | Payer: 59 | Source: Ambulatory Visit | Attending: Family Medicine | Admitting: Family Medicine

## 2014-10-12 ENCOUNTER — Other Ambulatory Visit: Payer: Self-pay | Admitting: Family Medicine

## 2014-10-12 DIAGNOSIS — R0600 Dyspnea, unspecified: Secondary | ICD-10-CM

## 2014-10-13 ENCOUNTER — Telehealth: Payer: Self-pay | Admitting: Cardiology

## 2014-10-13 NOTE — Telephone Encounter (Signed)
Pt's wife called in stating that his PCP found some fluid around his heart which is causing some respiratory issues. His PCP advised him to f/u with Dr. Stanford Breed as soon as possible. Please call  Thanks

## 2014-10-13 NOTE — Telephone Encounter (Signed)
Spoke to caller, and patient.  Patient was seen at PCP's office 1 day ago, for evaluation of ongoing dyspnea ~3 weeks from onset.  X-ray performed - physician alerted patient to "fluid around the heart" - recommended f/u w/ Dr. Stanford Breed.  Pt states dyspnea, esp on ambulation.  Informed pt may need to schedule w/ PA - they want to be seen by Dr. Stanford Breed only, if possible. Will route to advise.

## 2014-10-13 NOTE — Telephone Encounter (Signed)
Msg sent to [facility] for flex OV, other extender options.

## 2014-10-13 NOTE — Telephone Encounter (Signed)
I am not in office; will need PAOV Kirk Ruths

## 2014-10-14 ENCOUNTER — Ambulatory Visit (INDEPENDENT_AMBULATORY_CARE_PROVIDER_SITE_OTHER): Payer: 59 | Admitting: Cardiology

## 2014-10-14 ENCOUNTER — Telehealth: Payer: Self-pay | Admitting: *Deleted

## 2014-10-14 ENCOUNTER — Encounter: Payer: Self-pay | Admitting: Cardiology

## 2014-10-14 VITALS — BP 148/82 | HR 110 | Ht 70.0 in | Wt 273.0 lb

## 2014-10-14 DIAGNOSIS — I420 Dilated cardiomyopathy: Secondary | ICD-10-CM

## 2014-10-14 DIAGNOSIS — I441 Atrioventricular block, second degree: Secondary | ICD-10-CM | POA: Diagnosis not present

## 2014-10-14 DIAGNOSIS — R002 Palpitations: Secondary | ICD-10-CM | POA: Diagnosis not present

## 2014-10-14 DIAGNOSIS — R0602 Shortness of breath: Secondary | ICD-10-CM | POA: Diagnosis not present

## 2014-10-14 DIAGNOSIS — I5043 Acute on chronic combined systolic (congestive) and diastolic (congestive) heart failure: Secondary | ICD-10-CM

## 2014-10-14 MED ORDER — FUROSEMIDE 40 MG PO TABS: 40.0000 mg | ORAL_TABLET | Freq: Every day | ORAL | Status: DC

## 2014-10-14 NOTE — Telephone Encounter (Signed)
Jari Sportsman contacted me back regarding pt of Dr. Jacalyn Lefevre i spoke to yesterday about ongoing SOB - Dr. Stanford Breed was looking to add for PAOV, I had sent msg end of day yesterday to see if flex add-in OK or other option. When pt contacted, informed our staff he did not want to be seen by PA.  Jari Sportsman called me to inform me of schedule availability between offices - added for opening on Dr. Doug Sou schedule today 2pm - i called to inform patient. Wife answered phone, acknowledged.   Routing to Dr. Martinique, Cheryl.

## 2014-10-14 NOTE — Patient Instructions (Addendum)
Increase lasix to 40 mg twice a day for 2 days then 40 mg daily  You need to restrict your salt intake Low-Sodium Eating Plan Sodium raises blood pressure and causes water to be held in the body. Getting less sodium from food will help lower your blood pressure, reduce any swelling, and protect your heart, liver, and kidneys. We get sodium by adding salt (sodium chloride) to food. Most of our sodium comes from canned, boxed, and frozen foods. Restaurant foods, fast foods, and pizza are also very high in sodium. Even if you take medicine to lower your blood pressure or to reduce fluid in your body, getting less sodium from your food is important. WHAT IS MY PLAN? Most people should limit their sodium intake to 2,300 mg a day. Your health care provider recommends that you limit your sodium intake to __________ a day.  WHAT DO I NEED TO KNOW ABOUT THIS EATING PLAN? For the low-sodium eating plan, you will follow these general guidelines:  Choose foods with a % Daily Value for sodium of less than 5% (as listed on the food label).   Use salt-free seasonings or herbs instead of table salt or sea salt.   Check with your health care provider or pharmacist before using salt substitutes.   Eat fresh foods.  Eat more vegetables and fruits.  Limit canned vegetables. If you do use them, rinse them well to decrease the sodium.   Limit cheese to 1 oz (28 g) per day.   Eat lower-sodium products, often labeled as "lower sodium" or "no salt added."  Avoid foods that contain monosodium glutamate (MSG). MSG is sometimes added to [place] food and some canned foods.  Check food labels (Nutrition Facts labels) on foods to learn how much sodium is in one serving.  Eat more home-cooked food and less restaurant, buffet, and fast food.  When eating at a restaurant, ask that your food be prepared with less salt or none, if possible.  HOW DO I READ FOOD LABELS FOR SODIUM INFORMATION? The Nutrition  Facts label lists the amount of sodium in one serving of the food. If you eat more than one serving, you must multiply the listed amount of sodium by the number of servings. Food labels may also identify foods as:  Sodium free--Less than 5 mg in a serving.  Very low sodium--35 mg or less in a serving.  Low sodium--140 mg or less in a serving.  Light in sodium--50% less sodium in a serving. For example, if a food that usually has 300 mg of sodium is changed to become light in sodium, it will have 150 mg of sodium.  Reduced sodium--25% less sodium in a serving. For example, if a food that usually has 400 mg of sodium is changed to reduced sodium, it will have 300 mg of sodium. WHAT FOODS CAN I EAT? Grains Low-sodium cereals, including oats, puffed wheat and rice, and shredded wheat cereals. Low-sodium crackers. Unsalted rice and pasta. Lower-sodium bread.  Vegetables Frozen or fresh vegetables. Low-sodium or reduced-sodium canned vegetables. Low-sodium or reduced-sodium tomato sauce and paste. Low-sodium or reduced-sodium tomato and vegetable juices.  Fruits Fresh, frozen, and canned fruit. Fruit juice.  Meat and Other Protein Products Low-sodium canned tuna and salmon. Fresh or frozen meat, poultry, seafood, and fish. Lamb. Unsalted nuts. Dried beans, peas, and lentils without added salt. Unsalted canned beans. Homemade soups without salt. Eggs.  Dairy Milk. Soy milk. Ricotta cheese. Low-sodium or reduced-sodium cheeses. Yogurt.  Condiments Fresh  and dried herbs and spices. Salt-free seasonings. Onion and garlic powders. Low-sodium varieties of mustard and ketchup. Lemon juice.  Fats and Oils Reduced-sodium salad dressings. Unsalted butter.  Other Unsalted popcorn and pretzels.  The items listed above may not be a complete list of recommended foods or beverages. Contact your dietitian for more options. WHAT FOODS ARE NOT RECOMMENDED? Grains Instant hot cereals. Bread  stuffing, pancake, and biscuit mixes. Croutons. Seasoned rice or pasta mixes. Noodle soup cups. Boxed or frozen macaroni and cheese. Self-rising flour. Regular salted crackers. Vegetables Regular canned vegetables. Regular canned tomato sauce and paste. Regular tomato and vegetable juices. Frozen vegetables in sauces. Salted french fries. Olives. Angie Fava. Relishes. Sauerkraut. Salsa. Meat and Other Protein Products Salted, canned, smoked, spiced, or pickled meats, seafood, or fish. Bacon, ham, sausage, hot dogs, corned beef, chipped beef, and packaged luncheon meats. Salt pork. Jerky. Pickled herring. Anchovies, regular canned tuna, and sardines. Salted nuts. Dairy Processed cheese and cheese spreads. Cheese curds. Blue cheese and cottage cheese. Buttermilk.  Condiments Onion and garlic salt, seasoned salt, table salt, and sea salt. Canned and packaged gravies. Worcestershire sauce. Tartar sauce. Barbecue sauce. Teriyaki sauce. Soy sauce, including reduced sodium. Steak sauce. Fish sauce. Oyster sauce. Cocktail sauce. Horseradish. Regular ketchup and mustard. Meat flavorings and tenderizers. Bouillon cubes. Hot sauce. Tabasco sauce. Marinades. Taco seasonings. Relishes. Fats and Oils Regular salad dressings. Salted butter. Margarine. Ghee. Bacon fat.  Other Potato and tortilla chips. Corn chips and puffs. Salted popcorn and pretzels. Canned or dried soups. Pizza. Frozen entrees and pot pies.  The items listed above may not be a complete list of foods and beverages to avoid. Contact your dietitian for more information. Document Released: 10/14/2001 Document Revised: 04/29/2013 Document Reviewed: 02/26/2013 [facility] Patient Information 2015 [place], [place]. This information is not intended to replace advice given to you by your health care provider. Make sure you discuss any questions you have with your health care provider.  Continue your other therapy  We will arrange follow up in 2-3  weeks.

## 2014-10-14 NOTE — Progress Notes (Signed)
Cardiology Office Note   Date:  10/14/2014   ID:  William Villanueva, DOB 1950/08/09, MRN [id number]  PCP:  Gara Kroner, MD  Cardiologist:  Kirk Ruths MD  Chief Complaint  Patient presents with  . Follow-up    SOB      History of Present Illness: William Villanueva is a 64 y.o. male who presents for evaluation of increased dyspnea. He is followed by Dr. Stanford Breed for CHF. Echocardiogram in August of 2013 showed an ejection fraction of 35-40%. Cardiac catheterization in September of 2013 showed mild nonobstructive coronary disease. There was no hemodynamic evidence of restriction. Pulmonary capillary wedge pressure 10. Cardiac MRI in September of 2013 showed an ejection fraction of 34% with diffuse hypokinesis. There was no hyperenhancement or scar tissue and no evidence of cardiac hemochromatosis. TSH in September 2013 normal. 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 Patient subsequently had CRTP placed. Admitted 2/16 with CP and ruled out. Echo 2/16 showed EF 81-85, grade 1 diastolic dysfunction, moderate LAE. Nuclear study 2/16 showed EF 35, no ischemia. CTA 2/16 showed no PE.  Patient reports that in May he developed a cough and increased chest congestion associated with increased SOB. Cough was only with lying down and was clear phlegm. He was treated with antibiotics and diruetics. CXR showed CHF superimposed on COPD. He improved for a time but over the past 2 weeks had increased dyspnea and recurrent cough. No edema. Weight stable. He had been on CPAP therapy but had to stop because water was getting into his line from the humidifier. He has been on lasix 20 mg since last Friday.     Past Medical History  Diagnosis Date  . Polycythemia vera(238.4)   . Back pain   . Hemochromatosis   . Gout   . Hypertension   . Nephrolithiasis   . Cardiomyopathy     Nonischemic 45%.   [name] Hypospadias 1950-12-18    born with    Past Surgical History  Procedure  Laterality Date  . Ankle surgery    . Pilonidal cyst excision    . Tonsillectomy    . Bi-ventricular pacemaker insertion N/A 04/29/2014    Procedure: BI-VENTRICULAR PACEMAKER INSERTION (CRT-P);  Surgeon: Deboraha Sprang, MD;  Location: [facility] CATH LAB;  Service: Cardiovascular;  Laterality: N/A;     Current Outpatient Prescriptions  Medication Sig Dispense Refill  . anagrelide (AGRYLIN) 1 MG capsule Take 2 capsules a day alternating with 1 capsule a day. 60 capsule 6  . aspirin 81 MG tablet Take 81 mg by mouth daily.    . carvedilol (COREG) 25 MG tablet Take 1 tablet (25 mg total) by mouth 2 (two) times daily with a meal. 60 tablet 5  . colestipol (COLESTID) 1 G tablet Take 1 g by mouth 2 (two) times daily.    [name] dicyclomine (BENTYL) 20 MG tablet Take 20 mg by mouth 2 (two) times daily.    . febuxostat (ULORIC) 40 MG tablet Take 1 tablet (40 mg total) by mouth daily. 30 tablet 6  . isosorbide mononitrate (IMDUR) 30 MG 24 hr tablet Take 1 tablet (30 mg total) by mouth daily. 30 tablet 5  . omeprazole (PRILOSEC) 20 MG capsule Take 20 mg by mouth daily.     . polyvinyl alcohol (LIQUIFILM TEARS) 1.4 % ophthalmic solution Place 1-2 drops into both eyes as needed for dry eyes.    . tamsulosin (FLOMAX) 0.4 MG CAPS capsule Take 1 capsule (0.4 mg total)  by mouth as needed. (Patient taking differently: Take 0.4 mg by mouth daily. ) 30 capsule 6  . temazepam (RESTORIL) 22.5 MG capsule Take 1 capsule (22.5 mg total) by mouth at bedtime as needed for sleep. 30 capsule 2  . valsartan (DIOVAN) 80 MG tablet Take 1 tablet (80 mg total) by mouth daily. 30 tablet 11  . furosemide (LASIX) 40 MG tablet Take 1 tablet (40 mg total) by mouth daily. 30 tablet 6   No current facility-administered medications for this visit.   Facility-Administered Medications Ordered in Other Visits  Medication Dose Route Frequency Provider Last Rate Last Dose  . 0.9 %  sodium chloride infusion   Intravenous Continuous Volanda Napoleon,  MD   Stopped at 05/16/13 1115    Allergies:   Prednisone and Wellbutrin    Social History:  The patient  reports that he quit smoking about 18 months ago. His smoking use included Cigarettes. He started smoking about 46 years ago. He has a 44 pack-year smoking history. He has never used smokeless tobacco. He reports that he drinks alcohol. He reports that he does not use illicit drugs.   Family History:  The patient's family history includes Heart disease in his maternal grandmother; Stroke in his mother.    ROS:  Please see the history of present illness.   Otherwise, review of systems are positive for none.   All other systems are reviewed and negative.    PHYSICAL EXAM: VS:  BP 148/82 mmHg  Pulse 110  Ht 5' 10"  (1.778 m)  Wt 123.832 kg (273 lb)  BMI 39.17 kg/m2 , BMI Body mass index is 39.17 kg/(m^2). GEN: Well nourished, well developed, in no acute distress HEENT: normal Neck: no JVD, carotid bruits, or masses Cardiac: IRRR; no murmurs, rubs, or gallops,no edema  Respiratory:  clear to auscultation bilaterally, normal work of breathing GI: soft, nontender, nondistended, + BS. Abdomen is obese.  MS: no deformity or atrophy Skin: warm and dry, no rash Neuro:  Strength and sensation are intact Psych: euthymic mood, full affect   EKG:  EKG is ordered today. The ekg ordered today demonstrates NSR with some atrial tachy. Atrial sensed V pace. I have personally reviewed and interpreted this study.    Recent Labs: 04/28/2014: TSH 0.424 06/21/2014: B Natriuretic Peptide 225.0* 07/30/2014: ALT 15; BUN 21; Creatinine, Ser 1.24; HGB 13.1; Platelets 668*; Potassium 5.1; Sodium 139    Lipid Panel    Component Value Date/Time   CHOL 126 04/29/2014 0139   TRIG 146 04/29/2014 0139   HDL 29* 04/29/2014 0139   CHOLHDL 4.3 04/29/2014 0139   VLDL 29 04/29/2014 0139   LDLCALC 68 04/29/2014 0139      Wt Readings from Last 3 Encounters:  10/14/14 123.832 kg (273 lb)  09/02/14  124.467 kg (274 lb 6.4 oz)  08/28/14 125.646 kg (277 lb)      Other studies Reviewed: Additional studies/ records that were reviewed today include: CXR 10/12/14- persistent CHF superimposed on COPD. Labs from Dr. Dema Severin on 10/12/14- WBC 12.8. Hgb 11.6. Potassium 5.1. Creatinine 1.17. Other chemistries normal. TSH .35     ASSESSMENT AND PLAN:  1.  Acute on chronic combined systolic and diastolic CHF. I have recommended increasing lasix to 40 mg bid for 2 days then 40 mg daily. Counseled on sodium restriction. Clearly he is eating a lot of sodium in his diet. Will arrange follow up in 2 weeks.   2. Morbid obesity and OSA. Needs to get  CPAP functioning appropriately  3. Nonischemic cardiomyopathy.  4. HTN   Current medicines are reviewed at length with the patient today.  The patient does not have concerns regarding medicines.  The following changes have been made:  See above  Labs/ tests ordered today include:  Orders Placed This Encounter  Procedures  . EKG 12-Lead     Disposition:   FU with Dr. Stanford Breed or extender in 2 weeks  Signed, Peter Martinique, [place]  10/14/2014 5:53 PM    [facility] [address] [place], [place], [address] Phone [phone], Fax [phone]

## 2014-10-16 DIAGNOSIS — L989 Disorder of the skin and subcutaneous tissue, unspecified: Secondary | ICD-10-CM | POA: Insufficient documentation

## 2014-10-19 ENCOUNTER — Encounter: Payer: Self-pay | Admitting: Cardiology

## 2014-10-27 ENCOUNTER — Other Ambulatory Visit: Payer: Self-pay | Admitting: *Deleted

## 2014-10-27 DIAGNOSIS — D45 Polycythemia vera: Secondary | ICD-10-CM

## 2014-10-27 DIAGNOSIS — G4701 Insomnia due to medical condition: Secondary | ICD-10-CM

## 2014-10-27 DIAGNOSIS — G4700 Insomnia, unspecified: Secondary | ICD-10-CM

## 2014-10-27 MED ORDER — TEMAZEPAM 22.5 MG PO CAPS: 22.5000 mg | ORAL_CAPSULE | Freq: Every evening | ORAL | Status: DC | PRN

## 2014-10-29 ENCOUNTER — Other Ambulatory Visit (HOSPITAL_BASED_OUTPATIENT_CLINIC_OR_DEPARTMENT_OTHER): Payer: 59

## 2014-10-29 ENCOUNTER — Ambulatory Visit (HOSPITAL_BASED_OUTPATIENT_CLINIC_OR_DEPARTMENT_OTHER): Payer: 59 | Admitting: Family

## 2014-10-29 ENCOUNTER — Encounter: Payer: Self-pay | Admitting: Family

## 2014-10-29 VITALS — BP 129/68 | HR 78 | Temp 97.5°F | Resp 18 | Wt 276.4 lb

## 2014-10-29 DIAGNOSIS — K219 Gastro-esophageal reflux disease without esophagitis: Secondary | ICD-10-CM | POA: Insufficient documentation

## 2014-10-29 DIAGNOSIS — D45 Polycythemia vera: Secondary | ICD-10-CM

## 2014-10-29 DIAGNOSIS — I10 Essential (primary) hypertension: Secondary | ICD-10-CM | POA: Insufficient documentation

## 2014-10-29 DIAGNOSIS — F410 Panic disorder [episodic paroxysmal anxiety] without agoraphobia: Secondary | ICD-10-CM | POA: Insufficient documentation

## 2014-10-29 DIAGNOSIS — N400 Enlarged prostate without lower urinary tract symptoms: Secondary | ICD-10-CM | POA: Insufficient documentation

## 2014-10-29 LAB — FERRITIN CHCC: Ferritin: 17 ng/ml — ABNORMAL LOW (ref 22–316)

## 2014-10-29 LAB — CBC WITH DIFFERENTIAL (CANCER CENTER ONLY)
BASO#: 0.2 10*3/uL (ref 0.0–0.2)
BASO%: 1.6 % (ref 0.0–2.0)
EOS%: 3 % (ref 0.0–7.0)
Eosinophils Absolute: 0.4 10*3/uL (ref 0.0–0.5)
HCT: 42.8 % (ref 38.7–49.9)
HGB: 13.1 g/dL (ref 13.0–17.1)
LYMPH#: 1.7 10*3/uL (ref 0.9–3.3)
LYMPH%: 12.7 % — ABNORMAL LOW (ref 14.0–48.0)
MCH: 23.2 pg — ABNORMAL LOW (ref 28.0–33.4)
MCHC: 30.6 g/dL — ABNORMAL LOW (ref 32.0–35.9)
MCV: 76 fL — ABNORMAL LOW (ref 82–98)
MONO#: 0.8 10*3/uL (ref 0.1–0.9)
MONO%: 5.8 % (ref 0.0–13.0)
NEUT#: 10.1 10*3/uL — ABNORMAL HIGH (ref 1.5–6.5)
NEUT%: 76.9 % (ref 40.0–80.0)
Platelets: 639 10*3/uL — ABNORMAL HIGH (ref 145–400)
RBC: 5.64 10*6/uL (ref 4.20–5.70)
RDW: 17.4 % — ABNORMAL HIGH (ref 11.1–15.7)
WBC: 13.2 10*3/uL — ABNORMAL HIGH (ref 4.0–10.0)

## 2014-10-29 LAB — CMP (CANCER CENTER ONLY)
ALT(SGPT): 18 U/L (ref 10–47)
AST: 24 U/L (ref 11–38)
Albumin: 3.6 g/dL (ref 3.3–5.5)
Alkaline Phosphatase: 80 U/L (ref 26–84)
BUN, Bld: 12 mg/dL (ref 7–22)
CO2: 24 mEq/L (ref 18–33)
Calcium: 8.6 mg/dL (ref 8.0–10.3)
Chloride: 103 mEq/L (ref 98–108)
Creat: 1.6 mg/dl — ABNORMAL HIGH (ref 0.6–1.2)
Glucose, Bld: 181 mg/dL — ABNORMAL HIGH (ref 73–118)
Potassium: 3.8 mEq/L (ref 3.3–4.7)
Sodium: 135 mEq/L (ref 128–145)
Total Bilirubin: 1 mg/dl (ref 0.20–1.60)
Total Protein: 7 g/dL (ref 6.4–8.1)

## 2014-10-29 LAB — CHCC SATELLITE - SMEAR

## 2014-10-29 LAB — IRON AND TIBC CHCC
%SAT: 5 % — ABNORMAL LOW (ref 20–55)
Iron: 22 ug/dL — ABNORMAL LOW (ref 42–163)
TIBC: 424 ug/dL — ABNORMAL HIGH (ref 202–409)
UIBC: 401 ug/dL — ABNORMAL HIGH (ref 117–376)

## 2014-10-29 LAB — LACTATE DEHYDROGENASE: LDH: 238 U/L (ref 94–250)

## 2014-10-29 NOTE — Progress Notes (Signed)
Hematology and Oncology Follow Up Visit  William Villanueva [id number] [birth year] 64 y.o. 10/29/2014   Principle Diagnosis:  Polycythemia vera-JAK2 positive  Current Therapy:   Anagrelide 1 mg by mouth daily alternating with 2 mg a day Aspirin 81 mg by mouth daily Phlebotomy to maintain hematocrit below 45%    Interim History:  William Villanueva is here today with his wife for a follow-up. He is doing well and is feeling better. He has lost a little weight and stopped smoking. He is wearing his CPAP each night.  His last phlebotomy was in May 2015. His Hct today is 42.8.  His platelet count today is 639. He is doing well on Anagrelide and has not experienced any adverse effects. No bruising or episodes of bleeding.  No fever, chills, n/v, cough, rash, dizziness, chest pain, palpitations, abdominal pain, constipation, blood in urine or stool. He does experience some SOB on exertion due to COPD. He has diarrhea occassionally. No lymphadenopathy found on assessment.  No swelling, numbness or tingling in his extremities. No new aches or pains.  He is still exercising and going to the gym.  He has a good appetite and is making sure to stay well hydrated. His weight is stable.   Medications:    Medication List       This list is accurate as of: 10/29/14  9:06 AM.  Always use your most recent med list.               anagrelide 1 MG capsule  Commonly known as:  AGRYLIN  Take 2 capsules a day alternating with 1 capsule a day.     aspirin 81 MG tablet  Take 81 mg by mouth daily.     carvedilol 25 MG tablet  Commonly known as:  COREG  Take 1 tablet (25 mg total) by mouth 2 (two) times daily with a meal.     colestipol 1 G tablet  Commonly known as:  COLESTID  Take 1 g by mouth 2 (two) times daily.     dicyclomine 20 MG tablet  Commonly known as:  BENTYL  Take 20 mg by mouth 2 (two) times daily.     febuxostat 40 MG tablet  Commonly known as:  ULORIC  Take 1 tablet (40 mg total)  by mouth daily.     furosemide 40 MG tablet  Commonly known as:  LASIX  Take 1 tablet (40 mg total) by mouth daily.     isosorbide mononitrate 30 MG 24 hr tablet  Commonly known as:  IMDUR  Take 1 tablet (30 mg total) by mouth daily.     omeprazole 20 MG capsule  Commonly known as:  PRILOSEC  Take 20 mg by mouth daily.     polyvinyl alcohol 1.4 % ophthalmic solution  Commonly known as:  LIQUIFILM TEARS  Place 1-2 drops into both eyes as needed for dry eyes.     tamsulosin 0.4 MG Caps capsule  Commonly known as:  FLOMAX  Take 1 capsule (0.4 mg total) by mouth as needed.     temazepam 22.5 MG capsule  Commonly known as:  RESTORIL  Take 1 capsule (22.5 mg total) by mouth at bedtime as needed for sleep.     valsartan 80 MG tablet  Commonly known as:  DIOVAN  Take 1 tablet (80 mg total) by mouth daily.        Allergies:  Allergies  Allergen Reactions  . Prednisone Other (See Comments)    Abdominal  pain  . Wellbutrin [Bupropion] Hives    Past Medical History, Surgical history, Social history, and Family History were reviewed and updated.  Review of Systems: All other 10 point review of systems is negative.   Physical Exam:  vitals were not taken for this visit.  Wt Readings from Last 3 Encounters:  10/14/14 273 lb (123.832 kg)  09/02/14 274 lb 6.4 oz (124.467 kg)  08/28/14 277 lb (125.646 kg)    Ocular: Sclerae unicteric, pupils equal, round and reactive to light Ear-nose-throat: Oropharynx clear, dentition fair Lymphatic: No cervical or supraclavicular adenopathy Lungs no rales or rhonchi, good excursion bilaterally Heart regular rate and rhythm, no murmur appreciated Abd soft, nontender, positive bowel sounds MSK no focal spinal tenderness, no joint edema Neuro: non-focal, well-oriented, appropriate affect Breasts: Deferred  Lab Results  Component Value Date   WBC 13.2* 10/29/2014   HGB 13.1 10/29/2014   HCT 42.8 10/29/2014   MCV 76* 10/29/2014   PLT  639* 10/29/2014   Lab Results  Component Value Date   FERRITIN 20* 05/20/2014   IRON 21* 05/20/2014   TIBC 411* 05/20/2014   UIBC 390* 05/20/2014   IRONPCTSAT 5* 05/20/2014   Lab Results  Component Value Date   RETICCTPCT 1.1 05/20/2014   RBC 5.64 10/29/2014   RETICCTABS 65.2 05/20/2014   No results found for: KPAFRELGTCHN, LAMBDASER, KAPLAMBRATIO No results found for: IGGSERUM, IGA, IGMSERUM No results found for: Odetta Pink, SPEI   Chemistry      Component Value Date/Time   NA 139 07/30/2014 0819   NA 141 06/17/2014 0939   K 5.1 07/30/2014 0819   K 4.2 06/17/2014 0939   CL 109 07/30/2014 0819   CL 108 06/17/2014 0939   CO2 21 07/30/2014 0819   CO2 25 06/17/2014 0939   BUN 21 07/30/2014 0819   BUN 17 06/17/2014 0939   CREATININE 1.24 07/30/2014 0819   CREATININE 1.2 06/17/2014 0939      Component Value Date/Time   CALCIUM 9.0 07/30/2014 0819   CALCIUM 8.8 06/17/2014 0939   ALKPHOS 81 07/30/2014 0819   ALKPHOS 74 06/17/2014 0939   AST 17 07/30/2014 0819   AST 28 06/17/2014 0939   ALT 15 07/30/2014 0819   ALT 20 06/17/2014 0939   BILITOT 0.5 07/30/2014 0819   BILITOT 0.70 06/17/2014 0939      Impression and Plan: William Villanueva is 64 year old gentleman with polycythemia vera. He is doing well and has made several lifestyle changes that have made a big difference. He is asymptomatic at this time and has no complaints.  His Hct is 42.8 so we will not phlebotomize him today.  His platelet count continues to hold at 639. We will leave him on his same dose of Anagrelide.  All questions were answered. We will plan to see him back in 3 months for labs and follow-up.  Both he and his wife know to call with any questions or concerns. We can certainly see him sooner if need be.  William Bottom, NP 6/23/20169:06 AM

## 2014-11-02 ENCOUNTER — Ambulatory Visit (INDEPENDENT_AMBULATORY_CARE_PROVIDER_SITE_OTHER): Payer: 59 | Admitting: *Deleted

## 2014-11-02 DIAGNOSIS — I5043 Acute on chronic combined systolic (congestive) and diastolic (congestive) heart failure: Secondary | ICD-10-CM | POA: Diagnosis not present

## 2014-11-02 DIAGNOSIS — I441 Atrioventricular block, second degree: Secondary | ICD-10-CM

## 2014-11-02 NOTE — Progress Notes (Signed)
Remote pacemaker transmission.   

## 2014-11-13 LAB — CUP PACEART REMOTE DEVICE CHECK
Battery Remaining Longevity: 83 mo
Battery Remaining Percentage: 95.5 %
Battery Voltage: 2.96 V
Brady Statistic AP VP Percent: 1.6 %
Brady Statistic AP VS Percent: 1 %
Brady Statistic AS VP Percent: 98 %
Brady Statistic AS VS Percent: 1 %
Brady Statistic RA Percent Paced: 1.1 %
Date Time Interrogation Session: 20160627060137
Lead Channel Impedance Value: 460 Ohm
Lead Channel Impedance Value: 480 Ohm
Lead Channel Impedance Value: 640 Ohm
Lead Channel Pacing Threshold Amplitude: 0.875 V
Lead Channel Pacing Threshold Amplitude: 1.25 V
Lead Channel Pacing Threshold Amplitude: 1.625 V
Lead Channel Pacing Threshold Pulse Width: 0.4 ms
Lead Channel Pacing Threshold Pulse Width: 0.4 ms
Lead Channel Pacing Threshold Pulse Width: 0.7 ms
Lead Channel Sensing Intrinsic Amplitude: 12 mV
Lead Channel Sensing Intrinsic Amplitude: 3 mV
Lead Channel Setting Pacing Amplitude: 2 V
Lead Channel Setting Pacing Amplitude: 2.25 V
Lead Channel Setting Pacing Amplitude: 2.625
Lead Channel Setting Pacing Pulse Width: 0.4 ms
Lead Channel Setting Pacing Pulse Width: 0.7 ms
Lead Channel Setting Sensing Sensitivity: 2 mV
Pulse Gen Model: 3242
Pulse Gen Serial Number: 7701275

## 2014-11-23 NOTE — Progress Notes (Signed)
HPI: FU cardiomyopathy. Echocardiogram in August of 2013 showed an ejection fraction of 35-40%. Cardiac catheterization in September of 2013 showed mild nonobstructive coronary disease. There was no hemodynamic evidence of restriction. Pulmonary capillary wedge pressure 10. Cardiac MRI in September of 2013 showed an ejection fraction of 34% with diffuse hypokinesis. There was no hyperenhancement or scar tissue and no evidence of cardiac hemochromatosis. TSH in September 2013 normal. 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 Patient subsequently had CRTP placed. Admitted 2/16 with CP and ruled out. Echo 2/16 showed EF 23-53, grade 1 diastolic dysfunction, moderate LAE. Nuclear study 2/16 showed EF 35, no ischemia. CTA 2/16 showed no PE. Seen by Dr. Martinique in June with increased dyspnea and Lasix increased. Since last seen, patient did developed orthostatic symptoms after Lasix increased. He has therefore discontinued Lasix. He feels better. He denies dyspnea, chest pain, palpitations or syncope.  Current Outpatient Prescriptions  Medication Sig Dispense Refill  . anagrelide (AGRYLIN) 1 MG capsule Take 2 capsules a day alternating with 1 capsule a day. 60 capsule 6  . aspirin 81 MG tablet Take 81 mg by mouth daily.    . carvedilol (COREG) 25 MG tablet Take 1 tablet (25 mg total) by mouth 2 (two) times daily with a meal. 60 tablet 5  . colestipol (COLESTID) 1 G tablet Take 1 g by mouth 2 (two) times daily.    [name] dicyclomine (BENTYL) 20 MG tablet Take 20 mg by mouth 2 (two) times daily.    . febuxostat (ULORIC) 40 MG tablet Take 1 tablet (40 mg total) by mouth daily. 30 tablet 6  . irbesartan (AVAPRO) 150 MG tablet     . isosorbide mononitrate (IMDUR) 30 MG 24 hr tablet Take 1 tablet (30 mg total) by mouth daily. 30 tablet 5  . omeprazole (PRILOSEC) 20 MG capsule Take 20 mg by mouth daily.     . polyvinyl alcohol (LIQUIFILM TEARS) 1.4 % ophthalmic solution Place 1-2 drops into  both eyes as needed for dry eyes.    . tamsulosin (FLOMAX) 0.4 MG CAPS capsule Take 1 capsule (0.4 mg total) by mouth as needed. (Patient taking differently: Take 0.4 mg by mouth daily. ) 30 capsule 6  . temazepam (RESTORIL) 22.5 MG capsule Take 1 capsule (22.5 mg total) by mouth at bedtime as needed for sleep. 30 capsule 2   No current facility-administered medications for this visit.   Facility-Administered Medications Ordered in Other Visits  Medication Dose Route Frequency Provider Last Rate Last Dose  . 0.9 %  sodium chloride infusion   Intravenous Continuous Volanda Napoleon, MD   Stopped at 05/16/13 1115     Past Medical History  Diagnosis Date  . Polycythemia vera(238.4)   . Back pain   . Hemochromatosis   . Gout   . Hypertension   . Nephrolithiasis   . Cardiomyopathy     Nonischemic 45%.   [name] Hypospadias Sep 08, 1950    born with    Past Surgical History  Procedure Laterality Date  . Ankle surgery    . Pilonidal cyst excision    . Tonsillectomy    . Bi-ventricular pacemaker insertion N/A 04/29/2014    Procedure: BI-VENTRICULAR PACEMAKER INSERTION (CRT-P);  Surgeon: Deboraha Sprang, MD;  Location: [facility] CATH LAB;  Service: Cardiovascular;  Laterality: N/A;    History   Social History  . Marital Status: Married    Spouse Name: N/A  . Number of Children: N/A  .  Years of Education: N/A   Occupational History  . Not on file.   Social History Main Topics  . Smoking status: Former Smoker -- 1.00 packs/day for 44 years    Types: Cigarettes    Start date: 06/05/1968    Quit date: 04/05/2013  . Smokeless tobacco: Never Used     Comment: quit 2 years ago  . Alcohol Use: 0.0 oz/week    0 Standard drinks or equivalent per week     Comment: Occasional  . Drug Use: No  . Sexual Activity: Not on file   Other Topics Concern  . Not on file   Social History Narrative   Lives at home with wife.     ROS: back pain but no fevers or chills, productive cough, hemoptysis,  dysphasia, odynophagia, melena, hematochezia, dysuria, hematuria, rash, seizure activity, orthopnea, PND, pedal edema, claudication. Remaining systems are negative.  Physical Exam: Well-developed obese in no acute distress.  Skin is warm and dry.  HEENT is normal.  Neck is supple.  Chest is clear to auscultation with normal expansion.  Cardiovascular exam is regular rate and rhythm.  Abdominal exam nontender or distended. No masses palpated. Extremities show no edema. neuro grossly intact  ECG 10/14/2014-atrial sensed, ventricular paced.

## 2014-11-26 ENCOUNTER — Encounter: Payer: Self-pay | Admitting: Cardiology

## 2014-11-26 ENCOUNTER — Ambulatory Visit (INDEPENDENT_AMBULATORY_CARE_PROVIDER_SITE_OTHER): Payer: 59 | Admitting: Cardiology

## 2014-11-26 VITALS — BP 132/86 | HR 95 | Ht 70.0 in | Wt 273.0 lb

## 2014-11-26 DIAGNOSIS — I10 Essential (primary) hypertension: Secondary | ICD-10-CM | POA: Diagnosis not present

## 2014-11-26 DIAGNOSIS — I5043 Acute on chronic combined systolic (congestive) and diastolic (congestive) heart failure: Secondary | ICD-10-CM

## 2014-11-26 DIAGNOSIS — Q6100 Congenital renal cyst, unspecified: Secondary | ICD-10-CM

## 2014-11-26 DIAGNOSIS — E669 Obesity, unspecified: Secondary | ICD-10-CM | POA: Diagnosis not present

## 2014-11-26 DIAGNOSIS — N281 Cyst of kidney, acquired: Secondary | ICD-10-CM

## 2014-11-26 DIAGNOSIS — Z950 Presence of cardiac pacemaker: Secondary | ICD-10-CM

## 2014-11-26 MED ORDER — FUROSEMIDE 40 MG PO TABS
40.0000 mg | ORAL_TABLET | Freq: Every day | ORAL | Status: DC | PRN
Start: 2014-11-26 — End: 2015-01-21

## 2014-11-26 NOTE — Assessment & Plan Note (Signed)
Patient and wife state they followed up with primary care concerning this issue.

## 2014-11-26 NOTE — Assessment & Plan Note (Signed)
Followed by electrophysiology. 

## 2014-11-26 NOTE — Assessment & Plan Note (Signed)
Continue beta blocker and ARB. 

## 2014-11-26 NOTE — Assessment & Plan Note (Signed)
Patient developed orthostatic symptoms on Lasix. They therefore discontinued this medication. His symptoms have improved. I have instructed him on the importance of low sodium diet and fluid restriction. He will weigh himself daily. He will take 40 mg of Lasix daily as needed for dyspnea or weight gain of 2-3 pounds.

## 2014-11-26 NOTE — Assessment & Plan Note (Signed)
Blood pressure controlled. Continue present medications. 

## 2014-11-26 NOTE — Patient Instructions (Signed)
Your physician recommends that you schedule a follow-up appointment in: 3 MONTHS WITH DR CRENSHAW  TAKE FUROSEMIDE 40 MG AS NEEDED FOR WEIGHT GAIN, SHORTNESS OF BREATH OR SWELLING

## 2014-11-26 NOTE — Assessment & Plan Note (Signed)
Patient counseled on weight loss. 

## 2014-12-11 ENCOUNTER — Encounter: Payer: Self-pay | Admitting: Cardiology

## 2014-12-18 ENCOUNTER — Encounter: Payer: Self-pay | Admitting: Internal Medicine

## 2014-12-24 ENCOUNTER — Telehealth: Payer: Self-pay | Admitting: Cardiology

## 2014-12-24 NOTE — Telephone Encounter (Signed)
Spoke with pt, Aware of dr crenshaw's recommendations.  °

## 2014-12-24 NOTE — Telephone Encounter (Signed)
Ok for ciprofloxacin from a cardiac standpoint; possible interaction with anagrelide should be discussed with hematology or primary care before intitiating. Kirk Ruths

## 2014-12-24 NOTE — Telephone Encounter (Signed)
Pharmacy at [facility] called.  ENT physician wants to put patient on a course of Cipro (only antibiotic suitable that this pt can take) -Concern is for QT prolongation.  Wants OK from Dr. Stanford Breed - what else recommended? I mentioned may need EKG or monitor.  Patient does have PM - followed by Dr. Caryl Comes for this.  Also note pt is on Anagrelide for thrombocytopenia - pharmacy notes possible interaction w/ this med also.  Routing to Dr. Stanford Breed, Tommy Medal to advise.

## 2014-12-25 ENCOUNTER — Other Ambulatory Visit: Payer: Self-pay | Admitting: Physician Assistant

## 2014-12-25 ENCOUNTER — Other Ambulatory Visit: Payer: Self-pay | Admitting: Hematology & Oncology

## 2014-12-28 ENCOUNTER — Telehealth: Payer: Self-pay | Admitting: *Deleted

## 2014-12-28 NOTE — Telephone Encounter (Signed)
Patient called because his pharmacy informed him that his medication dosing had changed and he wanted to know why. He states that the pharmacy told him to start taking the anagrelide 69m daily instead of the alternating schedule. His chart was reviewed and this change was not found. Spoke with Dr EMarin Olpwho confirmed that dosing was never changed and that patient should continue alternating his doses as prescribed in his chart. Patient understood and will continue taking medication on his usual schedule.

## 2015-01-12 ENCOUNTER — Telehealth: Payer: Self-pay | Admitting: Cardiology

## 2015-01-12 DIAGNOSIS — IMO0001 Reserved for inherently not codable concepts without codable children: Secondary | ICD-10-CM

## 2015-01-12 DIAGNOSIS — R030 Elevated blood-pressure reading, without diagnosis of hypertension: Principal | ICD-10-CM

## 2015-01-12 NOTE — Telephone Encounter (Signed)
Several weeks progressively worse exertional dyspnea noticeably the last 2-3 days.  Mild shortness of breath noticed with talking to me on the phone  Weight this morning = 267  OV 7/18 = 273

## 2015-01-12 NOTE — Telephone Encounter (Signed)
Mrs. Trulson said that he was already taking Laisx 20 mg.  Dr. Stanford Breed notified

## 2015-01-12 NOTE — Telephone Encounter (Signed)
Pt real short of breath,can not breathe. Pt needs to be seen.

## 2015-01-12 NOTE — Telephone Encounter (Signed)
Mrs. Stanford Breed instructed to start patient on Lasix 40 mg a day  Instructed her to weigh him daily and call if weight increases or feels worse or no improvement over the next 3 days  Wife worried about him getting dehydrated

## 2015-01-12 NOTE — Telephone Encounter (Signed)
Bmet ordered for one week from 01/12/15  Wife aware

## 2015-01-14 ENCOUNTER — Other Ambulatory Visit: Payer: Self-pay | Admitting: *Deleted

## 2015-01-14 MED ORDER — COLESTIPOL HCL 1 G PO TABS: 1.0000 g | ORAL_TABLET | Freq: Two times a day (BID) | ORAL | Status: DC

## 2015-01-18 ENCOUNTER — Telehealth: Payer: Self-pay | Admitting: Cardiology

## 2015-01-18 NOTE — Telephone Encounter (Signed)
°  1. Which medications need to be refilled? Carvedilol -Please call this in today  2. Which pharmacy is medication to be sent to?[facility] [phone]  3. Do they need a 30 day or 90 day supply? 60 and refills  4. Would they like a call back once the medication has been sent to the pharmacy? no

## 2015-01-19 ENCOUNTER — Telehealth: Payer: Self-pay | Admitting: *Deleted

## 2015-01-19 ENCOUNTER — Encounter (HOSPITAL_COMMUNITY): Payer: Self-pay

## 2015-01-19 ENCOUNTER — Emergency Department (HOSPITAL_COMMUNITY): Payer: 59

## 2015-01-19 ENCOUNTER — Observation Stay (HOSPITAL_COMMUNITY)
Admission: EM | Admit: 2015-01-19 | Discharge: 2015-01-21 | Disposition: A | Discharge status: Home / Self Care | Payer: 59 | Attending: Cardiology | Admitting: Cardiology

## 2015-01-19 ENCOUNTER — Other Ambulatory Visit: Payer: Self-pay

## 2015-01-19 DIAGNOSIS — Z950 Presence of cardiac pacemaker: Secondary | ICD-10-CM | POA: Insufficient documentation

## 2015-01-19 DIAGNOSIS — I447 Left bundle-branch block, unspecified: Secondary | ICD-10-CM | POA: Diagnosis not present

## 2015-01-19 DIAGNOSIS — N400 Enlarged prostate without lower urinary tract symptoms: Secondary | ICD-10-CM | POA: Insufficient documentation

## 2015-01-19 DIAGNOSIS — J811 Chronic pulmonary edema: Secondary | ICD-10-CM | POA: Insufficient documentation

## 2015-01-19 DIAGNOSIS — D45 Polycythemia vera: Secondary | ICD-10-CM | POA: Insufficient documentation

## 2015-01-19 DIAGNOSIS — Z7982 Long term (current) use of aspirin: Secondary | ICD-10-CM | POA: Insufficient documentation

## 2015-01-19 DIAGNOSIS — I5031 Acute diastolic (congestive) heart failure: Secondary | ICD-10-CM | POA: Diagnosis not present

## 2015-01-19 DIAGNOSIS — N189 Chronic kidney disease, unspecified: Secondary | ICD-10-CM | POA: Diagnosis not present

## 2015-01-19 DIAGNOSIS — D72829 Elevated white blood cell count, unspecified: Secondary | ICD-10-CM | POA: Insufficient documentation

## 2015-01-19 DIAGNOSIS — Z6839 Body mass index (BMI) 39.0-39.9, adult: Secondary | ICD-10-CM | POA: Diagnosis not present

## 2015-01-19 DIAGNOSIS — E669 Obesity, unspecified: Secondary | ICD-10-CM | POA: Diagnosis present

## 2015-01-19 DIAGNOSIS — I5033 Acute on chronic diastolic (congestive) heart failure: Secondary | ICD-10-CM | POA: Diagnosis not present

## 2015-01-19 DIAGNOSIS — M545 Low back pain: Secondary | ICD-10-CM

## 2015-01-19 DIAGNOSIS — M549 Dorsalgia, unspecified: Secondary | ICD-10-CM | POA: Insufficient documentation

## 2015-01-19 DIAGNOSIS — I951 Orthostatic hypotension: Secondary | ICD-10-CM | POA: Insufficient documentation

## 2015-01-19 DIAGNOSIS — I129 Hypertensive chronic kidney disease with stage 1 through stage 4 chronic kidney disease, or unspecified chronic kidney disease: Secondary | ICD-10-CM | POA: Diagnosis not present

## 2015-01-19 DIAGNOSIS — R0602 Shortness of breath: Secondary | ICD-10-CM

## 2015-01-19 DIAGNOSIS — I509 Heart failure, unspecified: Secondary | ICD-10-CM

## 2015-01-19 DIAGNOSIS — Z888 Allergy status to other drugs, medicaments and biological substances status: Secondary | ICD-10-CM | POA: Insufficient documentation

## 2015-01-19 DIAGNOSIS — I2510 Atherosclerotic heart disease of native coronary artery without angina pectoris: Secondary | ICD-10-CM | POA: Diagnosis not present

## 2015-01-19 DIAGNOSIS — I429 Cardiomyopathy, unspecified: Secondary | ICD-10-CM | POA: Diagnosis not present

## 2015-01-19 HISTORY — DX: Heart failure, unspecified: I50.9

## 2015-01-19 LAB — CBC WITH DIFFERENTIAL/PLATELET
Basophils Absolute: 0.3 10*3/uL — ABNORMAL HIGH (ref 0.0–0.1)
Basophils Relative: 2 % — ABNORMAL HIGH (ref 0–1)
Eosinophils Absolute: 0.4 10*3/uL (ref 0.0–0.7)
Eosinophils Relative: 3 % (ref 0–5)
HCT: 41.2 % (ref 39.0–52.0)
Hemoglobin: 12.1 g/dL — ABNORMAL LOW (ref 13.0–17.0)
Lymphocytes Relative: 18 % (ref 12–46)
Lymphs Abs: 2.3 10*3/uL (ref 0.7–4.0)
MCH: 21.9 pg — ABNORMAL LOW (ref 26.0–34.0)
MCHC: 29.4 g/dL — ABNORMAL LOW (ref 30.0–36.0)
MCV: 74.5 fL — ABNORMAL LOW (ref 78.0–100.0)
Monocytes Absolute: 1.3 10*3/uL — ABNORMAL HIGH (ref 0.1–1.0)
Monocytes Relative: 10 % (ref 3–12)
Neutro Abs: 8.4 10*3/uL — ABNORMAL HIGH (ref 1.7–7.7)
Neutrophils Relative %: 67 % (ref 43–77)
Platelets: 478 10*3/uL — ABNORMAL HIGH (ref 150–400)
RBC: 5.53 MIL/uL (ref 4.22–5.81)
RDW: 18 % — ABNORMAL HIGH (ref 11.5–15.5)
WBC: 12.7 10*3/uL — ABNORMAL HIGH (ref 4.0–10.5)

## 2015-01-19 LAB — I-STAT TROPONIN, ED: Troponin i, poc: 0.03 ng/mL (ref 0.00–0.08)

## 2015-01-19 LAB — BASIC METABOLIC PANEL
Anion gap: 7 (ref 5–15)
BUN: 21 mg/dL — ABNORMAL HIGH (ref 6–20)
CO2: 20 mmol/L — ABNORMAL LOW (ref 22–32)
Calcium: 9 mg/dL (ref 8.9–10.3)
Chloride: 111 mmol/L (ref 101–111)
Creatinine, Ser: 1.33 mg/dL — ABNORMAL HIGH (ref 0.61–1.24)
GFR calc Af Amer: >60 mL/min (ref >60)
GFR calc non Af Amer: 55 mL/min — ABNORMAL LOW (ref >60)
Glucose, Bld: 128 mg/dL — ABNORMAL HIGH (ref 65–99)
Potassium: 4.3 mmol/L (ref 3.5–5.1)
Sodium: 138 mmol/L (ref 135–145)

## 2015-01-19 LAB — BRAIN NATRIURETIC PEPTIDE: B Natriuretic Peptide: 1222.8 pg/mL — ABNORMAL HIGH (ref 0.0–100.0)

## 2015-01-19 MED ORDER — ACETAMINOPHEN 325 MG PO TABS
650.0000 mg | ORAL_TABLET | ORAL | Status: DC | PRN
  Administered 2015-01-20: 650 mg via ORAL
  Filled 2015-01-19: qty 2

## 2015-01-19 MED ORDER — FUROSEMIDE 10 MG/ML IJ SOLN
40.0000 mg | INTRAMUSCULAR | Status: AC
  Administered 2015-01-19: 40 mg via INTRAVENOUS
  Filled 2015-01-19: qty 4

## 2015-01-19 MED ORDER — FINASTERIDE 5 MG PO TABS
5.0000 mg | ORAL_TABLET | Freq: Every day | ORAL | Status: DC
  Administered 2015-01-19 – 2015-01-21 (×3): 5 mg via ORAL
  Filled 2015-01-19 (×3): qty 1

## 2015-01-19 MED ORDER — TEMAZEPAM 7.5 MG PO CAPS: 22.5000 mg | ORAL_CAPSULE | Freq: Every evening | ORAL | Status: DC | PRN

## 2015-01-19 MED ORDER — CARVEDILOL 25 MG PO TABS: 25.0000 mg | ORAL_TABLET | Freq: Two times a day (BID) | ORAL | Status: DC

## 2015-01-19 MED ORDER — SODIUM CHLORIDE 0.9 % IV SOLN: 250.0000 mL | INTRAVENOUS | Status: DC | PRN

## 2015-01-19 MED ORDER — HEPARIN SODIUM (PORCINE) 5000 UNIT/ML IJ SOLN
5000.0000 [IU] | Freq: Three times a day (TID) | INTRAMUSCULAR | Status: DC
  Administered 2015-01-19 – 2015-01-21 (×5): 5000 [IU] via SUBCUTANEOUS
  Filled 2015-01-19 (×5): qty 1

## 2015-01-19 MED ORDER — CARVEDILOL 25 MG PO TABS
25.0000 mg | ORAL_TABLET | Freq: Two times a day (BID) | ORAL | Status: DC
  Administered 2015-01-19 – 2015-01-21 (×4): 25 mg via ORAL
  Filled 2015-01-19 (×4): qty 1

## 2015-01-19 MED ORDER — ANAGRELIDE HCL 0.5 MG PO CAPS
1.0000 mg | ORAL_CAPSULE | Freq: Every day | ORAL | Status: DC
  Administered 2015-01-20 – 2015-01-21 (×2): 1 mg via ORAL
  Filled 2015-01-19 (×2): qty 2

## 2015-01-19 MED ORDER — DICYCLOMINE HCL 20 MG PO TABS: 20.0000 mg | ORAL_TABLET | Freq: Two times a day (BID) | ORAL | Status: DC | PRN

## 2015-01-19 MED ORDER — FEBUXOSTAT 40 MG PO TABS
40.0000 mg | ORAL_TABLET | Freq: Every day | ORAL | Status: DC
  Administered 2015-01-19 – 2015-01-21 (×3): 40 mg via ORAL
  Filled 2015-01-19 (×3): qty 1

## 2015-01-19 MED ORDER — PANTOPRAZOLE SODIUM 40 MG PO TBEC
40.0000 mg | DELAYED_RELEASE_TABLET | Freq: Every day | ORAL | Status: DC
  Administered 2015-01-20 – 2015-01-21 (×2): 40 mg via ORAL
  Filled 2015-01-19 (×2): qty 1

## 2015-01-19 MED ORDER — ONDANSETRON HCL 4 MG/2ML IJ SOLN: 4.0000 mg | Freq: Four times a day (QID) | INTRAMUSCULAR | Status: DC | PRN

## 2015-01-19 MED ORDER — COLESTIPOL HCL 1 G PO TABS
1.0000 g | ORAL_TABLET | Freq: Two times a day (BID) | ORAL | Status: DC
  Filled 2015-01-19 (×4): qty 1

## 2015-01-19 MED ORDER — SODIUM CHLORIDE 0.9 % IJ SOLN: 3.0000 mL | INTRAMUSCULAR | Status: DC | PRN

## 2015-01-19 MED ORDER — ASPIRIN 81 MG PO CHEW
81.0000 mg | CHEWABLE_TABLET | Freq: Every day | ORAL | Status: DC
  Administered 2015-01-19 – 2015-01-21 (×3): 81 mg via ORAL
  Filled 2015-01-19 (×4): qty 1

## 2015-01-19 MED ORDER — IRBESARTAN 300 MG PO TABS
150.0000 mg | ORAL_TABLET | Freq: Every day | ORAL | Status: DC
  Administered 2015-01-20 – 2015-01-21 (×2): 150 mg via ORAL
  Filled 2015-01-19 (×2): qty 1

## 2015-01-19 MED ORDER — SODIUM CHLORIDE 0.9 % IJ SOLN
3.0000 mL | Freq: Two times a day (BID) | INTRAMUSCULAR | Status: DC
  Administered 2015-01-19 – 2015-01-21 (×5): 3 mL via INTRAVENOUS

## 2015-01-19 MED ORDER — FUROSEMIDE 10 MG/ML IJ SOLN
40.0000 mg | Freq: Once | INTRAMUSCULAR | Status: AC
  Administered 2015-01-19: 40 mg via INTRAVENOUS
  Filled 2015-01-19: qty 4

## 2015-01-19 MED ORDER — POLYVINYL ALCOHOL 1.4 % OP SOLN
1.0000 [drp] | OPHTHALMIC | Status: DC | PRN
  Filled 2015-01-19: qty 15

## 2015-01-19 NOTE — H&P (Signed)
Patient ID: William Villanueva MRN: [id number], DOB/AGE: 63-Apr-1952   Admit date: 01/19/2015   Primary Physician: Gara Kroner, MD Primary Cardiologist: Dr. Stanford Breed Electrophysiologist: Dr. Caryl Comes  Pt. Profile:  64 y/o male with h/o high degree AVB + NICM with improvement in EF from 35% to 50-55% after CRTP insertion, chronic diastolic CHF and h/o orthostatic hypotension, presenting to the ED with complaints of dyspnea, orthopnea and PND after self discontinuation of his lasix.   Problem List  Past Medical History  Diagnosis Date  . Polycythemia vera(238.4)   . Back pain   . Hemochromatosis   . Gout   . Hypertension   . Nephrolithiasis   . Cardiomyopathy     Nonischemic 45%.   [name] Hypospadias Sep 17, 1950    born with  . CHF (congestive heart failure)     Past Surgical History  Procedure Laterality Date  . Ankle surgery    . Pilonidal cyst excision    . Tonsillectomy    . Bi-ventricular pacemaker insertion N/A 04/29/2014    Procedure: BI-VENTRICULAR PACEMAKER INSERTION (CRT-P);  Surgeon: Deboraha Sprang, MD;  Location: [facility] CATH LAB;  Service: Cardiovascular;  Laterality: N/A;     Allergies  Allergies  Allergen Reactions  . Prednisone Other (See Comments)    Abdominal pain  . Wellbutrin [Bupropion] Hives    HPI  64 y/o male, followed by Dr. Stanford Breed, with a h/o nonischemic cardiomyopathy/prior h/o  systolic dysfunction and chronic diastolic CHF. EF in 2013 was 35% but cardiac cath showed nonobstructive CAD. Cardiac MRI in September of 2013 showed an ejection fraction of 34% with diffuse hypokinesis. There was no hyperenhancement or scar tissue and no evidence of cardiac hemochromatosis. In 2015, he developed high degree AV block and subsequently underwent insertion of a CRTP device, which is followed by Dr. Caryl Comes. His EF has since improved. 2D echo 06/2014 showed an EF of 50-55%. His last nuclear study was also 06/2014 which was negative for ischemia. Additional medical  problems include orthostatic hypotension and he has a tendency to self discontinue any medications that may cause hypotension when symptoms arise.  He recently developed symtoms of orthostatic hypotension and self discontinued his lasix for several days. Subsequently, this lead to the development of  dyspnea. He notes increased DOE, orthopnea/PND and a dry cough. He also notes mild chest pressure, but only when he lays down. No exertional CP.   He initially presented to our [facility] office today, but due to his labored breathing was sent to the ED for further management.   CXR shows mild CHF with interstitial pulmonary edema. No pleural effusion or consolidation. BNP is elevated at 1222. His SCr is 1.33 which appears to be close to his baseline. EKG is unchanged compared to prior EKGs.  He was given IV lasix in the ED. He notes good UOP since and symptoms have already improved.    Home Medications  Prior to Admission medications   Medication Sig Start Date End Date Taking? Authorizing Provider  anagrelide (AGRYLIN) 1 MG capsule Take 2 capsules a day alternating with 1 capsule a day. 03/18/14  Yes Volanda Napoleon, MD  aspirin 81 MG tablet Take 81 mg by mouth daily.   Yes Historical Provider, MD  carvedilol (COREG) 25 MG tablet Take 1 tablet (25 mg total) by mouth 2 (two) times daily with a meal. 01/19/15  Yes Lelon Perla, MD  colestipol (COLESTID) 1 G tablet Take 1 tablet (1 g total) by mouth 2 (two) times  daily. Patient taking differently: Take 1 g by mouth 2 (two) times daily as needed (for diarrhea symptoms).  01/14/15  Yes Lelon Perla, MD  dicyclomine (BENTYL) 20 MG tablet Take 20 mg by mouth 2 (two) times daily as needed (for stomach spasms).    Yes Historical Provider, MD  furosemide (LASIX) 40 MG tablet Take 1 tablet (40 mg total) by mouth daily as needed. Patient taking differently: Take 40 mg by mouth daily.  11/26/14  Yes Lelon Perla, MD  ibuprofen (ADVIL,MOTRIN) 200 MG  tablet Take 400 mg by mouth every 6 (six) hours as needed (for pain).   Yes Historical Provider, MD  irbesartan (AVAPRO) 150 MG tablet Take 150 mg by mouth daily.  10/12/14  Yes Historical Provider, MD  isosorbide mononitrate (IMDUR) 30 MG 24 hr tablet TAKE 1 TABLET (30 MG TOTAL) BY MOUTH DAILY. 12/25/14  Yes Lelon Perla, MD  omeprazole (PRILOSEC) 20 MG capsule Take 20 mg by mouth daily.    Yes Historical Provider, MD  polyvinyl alcohol (LIQUIFILM TEARS) 1.4 % ophthalmic solution Place 1-2 drops into both eyes as needed for dry eyes.   Yes Historical Provider, MD  tamsulosin (FLOMAX) 0.4 MG CAPS capsule TAKE 1 CAPSULE BY MOUTH DAILY AS NEEDED. Patient taking differently: TAKE 1 CAPSULE BY MOUTH DAILY 12/25/14  Yes Volanda Napoleon, MD  temazepam (RESTORIL) 22.5 MG capsule Take 1 capsule (22.5 mg total) by mouth at bedtime as needed for sleep. 10/27/14  Yes Volanda Napoleon, MD  ULORIC 40 MG tablet TAKE 1 TABLET BY MOUTH DAILY. 12/25/14  Yes Volanda Napoleon, MD    Family History  Family History  Problem Relation Age of Onset  . Heart disease Maternal Grandmother     Pacemaker, MI  . Stroke Mother     Social History  Social History   Social History  . Marital Status: Married    Spouse Name: N/A  . Number of Children: N/A  . Years of Education: N/A   Occupational History  . Not on file.   Social History Main Topics  . Smoking status: Former Smoker -- 1.00 packs/day for 44 years    Types: Cigarettes    Start date: 06/05/1968    Quit date: 04/05/2013  . Smokeless tobacco: Never Used     Comment: quit 2 years ago  . Alcohol Use: 0.0 oz/week    0 Standard drinks or equivalent per week     Comment: Occasional  . Drug Use: No  . Sexual Activity: Not on file   Other Topics Concern  . Not on file   Social History Narrative   Lives at home with wife.      Review of Systems General:  No chills, fever, night sweats or weight changes.  Cardiovascular:  No chest pain, dyspnea on  exertion, edema, orthopnea, palpitations, paroxysmal nocturnal dyspnea. Dermatological: No rash, lesions/masses Respiratory: No cough, dyspnea Urologic: No hematuria, dysuria Abdominal:   No nausea, vomiting, diarrhea, bright red blood per rectum, melena, or hematemesis Neurologic:  No visual changes, wkns, changes in mental status. All other systems reviewed and are otherwise negative except as noted above.  Physical Exam  Blood pressure 131/80, pulse 84, temperature 97.6 F (36.4 C), temperature source Oral, resp. rate 19, height 5\' 10"  (1.778 m), weight 274 lb 4.8 oz (124.422 kg), SpO2 98 %.  General: Pleasant, NAD, moderately obese Psych: Normal affect. Neuro: Alert and oriented X 3. Moves all extremities spontaneously. HEENT: Normal  Neck:  Supple without bruits or JVD. Lungs:  Resp regular and unlabored, CTA. Heart: RRR no s3, s4, or murmurs. Abdomen: Soft, non-tender, non-distended, BS + x 4.  Extremities: No clubbing, cyanosis or edema. DP/PT/Radials 2+ and equal bilaterally.  Labs  Troponin [facility] Test)  Recent Labs  01/19/15 0912  TROPIPOC 0.03   No results for input(s): CKTOTAL, CKMB, TROPONINI in the last 72 hours. Lab Results  Component Value Date   WBC 12.7* 01/19/2015   HGB 12.1* 01/19/2015   HCT 41.2 01/19/2015   MCV 74.5* 01/19/2015   PLT 478* 01/19/2015    Recent Labs Lab 01/19/15 0908  NA 138  K 4.3  CL 111  CO2 20*  BUN 21*  CREATININE 1.33*  CALCIUM 9.0  GLUCOSE 128*   Lab Results  Component Value Date   CHOL 126 04/29/2014   HDL 29* 04/29/2014   LDLCALC 68 04/29/2014   TRIG 146 04/29/2014   Lab Results  Component Value Date   DDIMER 0.47 04/28/2014     Radiology/Studies  Dg Chest 2 View  01/19/2015   CLINICAL DATA:  Short of breath. Chest pain for 1 week. Productive cough.  EXAM: CHEST  2 VIEW  COMPARISON:  10/12/2014.  06/21/2014.  01/11/2012.  FINDINGS: Unchanged 3 lead LEFT subclavian cardiac pacemaker with coronary  sinus lead. Mild CHF is present with interstitial pulmonary edema. No pleural effusions. No focal consolidation. 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  IMPRESSION: Mild CHF with interstitial pulmonary edema.   Electronically Signed   By: Dereck Ligas M.D.   On: 01/19/2015 09:46    ECG  V-paced. HR 78 bpm. Unchanged from prior EKGs    ASSESSMENT AND PLAN  1. Acute on Chronic Diastolic CHF: initially presented with complaint of DOE, orthopnea and PND over the last 2 days after holding his lasix. BNP 1200 and CXR with mild pulmonary edema. He already notes improvement after one dose of IV lasix. Will admit for continued diuresis with IV lasix and will reassess status in the am. Strict I/Os and low sodium diet.   2. Orthostatic Hypotension: patient notes history of this as well as recent symptoms, prompting him to hold his lasix. His BP in the ED is stable at 121/81. Orthostatics were checked in the ED. He has mild drops in systolic BP with position changes but SBP did not drop below 90 mmhg in the ED and he remained asymptomatic. He is on multiple antihypertensives at pretty high doses including Coreg, irbesartan, Imdur, lasix and tamsulosin for BPH. Given improvement in EF, and issues with orthostatic hypotenion, may need to lower doses to prevent recurrence. We will continue to monitor BP closely and will adjust medication doses prior to discharge if needed. We will d/c Imdur for now and see how his does.   3. Leukocytosis: WBC elevated at 12.7. He is afebrile and denies fever, chills and dysuria. CXR w/o infiltrate. Will order f/u CBC in the am.   4. Chronic renal insufficiency: SCr 1.33 on admit, which is at his baseline. Given treatment with IV Lasix, will need to monitor renal function closely. Will get f/u BMP in the am.    Signed, Lyda Jester, PA-C 01/19/2015, 10:56 AM

## 2015-01-19 NOTE — Telephone Encounter (Signed)
Rx for Carvedilol 25mg  sent to [place] at [facility]

## 2015-01-19 NOTE — ED Provider Notes (Signed)
CSN: [id number]     Arrival date & time 01/19/15  5681 History   First MD Initiated Contact with Patient 01/19/15 [phone]     Chief Complaint  Patient presents with  . Shortness of Breath     (Consider location/radiation/quality/duration/timing/severity/associated sxs/prior Treatment) Patient is a 64 y.o. male presenting with shortness of breath. The history is provided by the patient and medical records.  Shortness of Breath  This is a 64 year old male with history of polycythemia vera, hemochromatosis, gout, hypertension, cardiomyopathy, congestive heart failure, presenting to the ED for shortness of breath. Patient was started on Lasix on 01/13/2015 for shortness of breath and fluid overload. He states he has been taking these as directed without significant improvement of his weight. Baseline weight is around 165, today he weighs 274. Does admit to eating more salt than he should over the weekend (he was at a cookout).  wife also states she felt that he was dehydrated so she was giving him more water.  States for the past 2 days he has had increased shortness of breath and has had little sleep as he has to stay upright or he cannot breathe. Patient has sleep apnea as well, uses CPAP nightly. He states he has increased shortness of breath with exertion, is to the point now where he is even having difficulty walking across a room in his home.  He denies any cough, fever, or chills. No chest pain. He did hold his lasix the past 2 days due to orthostatic hypotension.  Patient is not on home O2.  Patient was seen in the clinic today by Dr. Ellyn Hack and was referred to the ED for admission and IV diuresis.  Past Medical History  Diagnosis Date  . Polycythemia vera(238.4)   . Back pain   . Hemochromatosis   . Gout   . Hypertension   . Nephrolithiasis   . Cardiomyopathy     Nonischemic 45%.   [name] Hypospadias Oct 19, 1950    born with  . CHF (congestive heart failure)    Past Surgical History   Procedure Laterality Date  . Ankle surgery    . Pilonidal cyst excision    . Tonsillectomy    . Bi-ventricular pacemaker insertion N/A 04/29/2014    Procedure: BI-VENTRICULAR PACEMAKER INSERTION (CRT-P);  Surgeon: Deboraha Sprang, MD;  Location: [facility] CATH LAB;  Service: Cardiovascular;  Laterality: N/A;   Family History  Problem Relation Age of Onset  . Heart disease Maternal Grandmother     Pacemaker, MI  . Stroke Mother    Social History  Substance Use Topics  . Smoking status: Former Smoker -- 1.00 packs/day for 44 years    Types: Cigarettes    Start date: 06/05/1968    Quit date: 04/05/2013  . Smokeless tobacco: Never Used     Comment: quit 2 years ago  . Alcohol Use: 0.0 oz/week    0 Standard drinks or equivalent per week     Comment: Occasional    Review of Systems  Respiratory: Positive for shortness of breath.   All other systems reviewed and are negative.     Allergies  Prednisone and Wellbutrin  Home Medications   Prior to Admission medications   Medication Sig Start Date End Date Taking? Authorizing Provider  anagrelide (AGRYLIN) 1 MG capsule Take 2 capsules a day alternating with 1 capsule a day. 03/18/14   Volanda Napoleon, MD  aspirin 81 MG tablet Take 81 mg by mouth daily.    Historical Provider,  MD  carvedilol (COREG) 25 MG tablet Take 1 tablet (25 mg total) by mouth 2 (two) times daily with a meal. 01/19/15   Lelon Perla, MD  colestipol (COLESTID) 1 G tablet Take 1 tablet (1 g total) by mouth 2 (two) times daily. 01/14/15   Lelon Perla, MD  dicyclomine (BENTYL) 20 MG tablet Take 20 mg by mouth 2 (two) times daily.    Historical Provider, MD  furosemide (LASIX) 40 MG tablet Take 1 tablet (40 mg total) by mouth daily as needed. 11/26/14   Lelon Perla, MD  irbesartan (AVAPRO) 150 MG tablet  10/12/14   Historical Provider, MD  isosorbide mononitrate (IMDUR) 30 MG 24 hr tablet TAKE 1 TABLET (30 MG TOTAL) BY MOUTH DAILY. 12/25/14   Lelon Perla, MD   omeprazole (PRILOSEC) 20 MG capsule Take 20 mg by mouth daily.     Historical Provider, MD  polyvinyl alcohol (LIQUIFILM TEARS) 1.4 % ophthalmic solution Place 1-2 drops into both eyes as needed for dry eyes.    Historical Provider, MD  tamsulosin (FLOMAX) 0.4 MG CAPS capsule TAKE 1 CAPSULE BY MOUTH DAILY AS NEEDED. 12/25/14   Volanda Napoleon, MD  temazepam (RESTORIL) 22.5 MG capsule Take 1 capsule (22.5 mg total) by mouth at bedtime as needed for sleep. 10/27/14   Volanda Napoleon, MD  ULORIC 40 MG tablet TAKE 1 TABLET BY MOUTH DAILY. 12/25/14   Volanda Napoleon, MD   BP 131/80 mmHg  Pulse 84  Temp(Src) 97.6 F (36.4 C) (Oral)  Resp 19  Ht 5\' 10"  (1.778 m)  Wt 274 lb 4.8 oz (124.422 kg)  BMI 39.36 kg/m2  SpO2 98%   Physical Exam  Constitutional: He is oriented to person, place, and time. He appears well-developed and well-nourished.  HENT:  Head: Normocephalic and atraumatic.  Mouth/Throat: Oropharynx is clear and moist.  Eyes: Conjunctivae and EOM are normal. Pupils are equal, round, and reactive to light.  Neck: Normal range of motion.  Cardiovascular: Normal rate, regular rhythm and normal heart sounds.   Pulmonary/Chest: Effort normal and breath sounds normal. No respiratory distress. He has no wheezes.  Abdominal: Soft. Bowel sounds are normal.  Musculoskeletal: Normal range of motion.  No significant pitting edema No calf asymmetry, tenderness, or palpable cords; no overlying skin changes or warmth to touch; DP pulses intact BLE  Neurological: He is alert and oriented to person, place, and time.  Skin: Skin is warm and dry.  Psychiatric: He has a normal mood and affect.  Nursing note and vitals reviewed.   ED Course  Procedures (including critical care time) Labs Review Labs Reviewed  CBC WITH DIFFERENTIAL/PLATELET - Abnormal; Notable for the following:    WBC 12.7 (*)    Hemoglobin 12.1 (*)    MCV 74.5 (*)    MCH 21.9 (*)    MCHC 29.4 (*)    RDW 18.0 (*)     Platelets 478 (*)    All other components within normal limits  BASIC METABOLIC PANEL - Abnormal; Notable for the following:    CO2 20 (*)    Glucose, Bld 128 (*)    BUN 21 (*)    Creatinine, Ser 1.33 (*)    GFR calc non Af Amer 55 (*)    All other components within normal limits  BRAIN NATRIURETIC PEPTIDE - Abnormal; Notable for the following:    B Natriuretic Peptide 1222.8 (*)    All other components within normal limits  I-STAT  TROPOININ, ED    Imaging Review Dg Chest 2 View  01/19/2015   CLINICAL DATA:  Short of breath. Chest pain for 1 week. Productive cough.  EXAM: CHEST  2 VIEW  COMPARISON:  10/12/2014.  06/21/2014.  01/11/2012.  FINDINGS: Unchanged 3 lead LEFT subclavian cardiac pacemaker with coronary sinus lead. Mild CHF is present with interstitial pulmonary edema. No pleural effusions. No focal consolidation. 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  IMPRESSION: Mild CHF with interstitial pulmonary edema.   Electronically Signed   By: Dereck Ligas M.D.   On: 01/19/2015 09:46   I have personally reviewed and evaluated these images and lab results as part of my medical decision-making.   EKG Interpretation   Date/Time:  Tuesday January 19 2015 08:57:37 EDT Ventricular Rate:  89 PR Interval:  176 QRS Duration: 121 QT Interval:  425 QTC Calculation: 517 R Axis:   -82 Text Interpretation:  Atrial-sensed ventricular-paced complexes Baseline  wander in lead(s) V2 No significant change since last tracing Confirmed by  KNOTT MD, DANIEL ([id number]) on 01/19/2015 9:04:52 AM      MDM   Final diagnoses:  CHF exacerbation  Shortness of breath   64 y.o. M here with SOB.  No chest pain reported.  VSS.  Work-up today consistent with CHF exacerbation with BNP 1200+ and pulmonary edema noted on CXR.  Patient is not requiring supplemental oxygen at this time, however has not responded to outpatient diuretics.  He will require admission for further diuresis.  Case  discussed with cardiology-- has evaluated in the ED and will admit for further management.  Larene Pickett, PA-C 01/19/15 1329  Leo Grosser, MD 01/19/15 [phone]

## 2015-01-19 NOTE — Telephone Encounter (Signed)
Pt walked into the office this morning, he is c/o SOB and not sleeping for 2 nights. He was placed on furosemide 40 mg on 01-13-15 with no real improvement in his weight. Today weight is 270.9 lbs. He has not taken any furosemide since Sunday because his bp got to 120 and he was orthostatic. He does report eating more salt and drinking lots of water. He does not feel he can take the furosemide daily due to orthostatic symptoms. For the last 2 nights when he lies down he will start coughing. He has no edema in his feet but his wife feels his abdomen is distended. Discussed with dr Ellyn Hack (DOD), pt will go to the ER for diuresis. Pa at the hosp aware pt is coming.

## 2015-01-19 NOTE — ED Notes (Signed)
Pt. Presents with complaint of SOB for past two days. Pt. Unable to sleep at night. Pt. With hx of CHF. Seen by Dr. Ellyn Hack today and sent here. Pt. Ambulatory, AxO x4.

## 2015-01-20 DIAGNOSIS — N400 Enlarged prostate without lower urinary tract symptoms: Secondary | ICD-10-CM | POA: Diagnosis not present

## 2015-01-20 DIAGNOSIS — R0602 Shortness of breath: Secondary | ICD-10-CM

## 2015-01-20 DIAGNOSIS — I5031 Acute diastolic (congestive) heart failure: Secondary | ICD-10-CM | POA: Diagnosis not present

## 2015-01-20 DIAGNOSIS — E669 Obesity, unspecified: Secondary | ICD-10-CM | POA: Diagnosis not present

## 2015-01-20 DIAGNOSIS — I951 Orthostatic hypotension: Secondary | ICD-10-CM | POA: Diagnosis not present

## 2015-01-20 LAB — CBC
HCT: 40.5 % (ref 39.0–52.0)
Hemoglobin: 12.3 g/dL — ABNORMAL LOW (ref 13.0–17.0)
MCH: 22.6 pg — ABNORMAL LOW (ref 26.0–34.0)
MCHC: 30.4 g/dL (ref 30.0–36.0)
MCV: 74.4 fL — ABNORMAL LOW (ref 78.0–100.0)
Platelets: 516 10*3/uL — ABNORMAL HIGH (ref 150–400)
RBC: 5.44 MIL/uL (ref 4.22–5.81)
RDW: 18 % — ABNORMAL HIGH (ref 11.5–15.5)
WBC: 12.3 10*3/uL — ABNORMAL HIGH (ref 4.0–10.5)

## 2015-01-20 LAB — BASIC METABOLIC PANEL
Anion gap: 10 (ref 5–15)
BUN: 34 mg/dL — ABNORMAL HIGH (ref 6–20)
CO2: 22 mmol/L (ref 22–32)
Calcium: 8.9 mg/dL (ref 8.9–10.3)
Chloride: 108 mmol/L (ref 101–111)
Creatinine, Ser: 1.63 mg/dL — ABNORMAL HIGH (ref 0.61–1.24)
GFR calc Af Amer: 50 mL/min — ABNORMAL LOW (ref >60)
GFR calc non Af Amer: 43 mL/min — ABNORMAL LOW (ref >60)
Glucose, Bld: 113 mg/dL — ABNORMAL HIGH (ref 65–99)
Potassium: 3.9 mmol/L (ref 3.5–5.1)
Sodium: 140 mmol/L (ref 135–145)

## 2015-01-20 MED ORDER — INFLUENZA VAC SPLIT QUAD 0.5 ML IM SUSY
0.5000 mL | PREFILLED_SYRINGE | INTRAMUSCULAR | Status: AC
  Administered 2015-01-21: 0.5 mL via INTRAMUSCULAR
  Filled 2015-01-20: qty 0.5

## 2015-01-20 MED ORDER — FUROSEMIDE 40 MG PO TABS
40.0000 mg | ORAL_TABLET | Freq: Every day | ORAL | Status: DC
  Administered 2015-01-20: 40 mg via ORAL
  Filled 2015-01-20 (×2): qty 1

## 2015-01-20 MED ORDER — PNEUMOCOCCAL VAC POLYVALENT 25 MCG/0.5ML IJ INJ
0.5000 mL | INJECTION | INTRAMUSCULAR | Status: AC
  Administered 2015-01-21: 0.5 mL via INTRAMUSCULAR
  Filled 2015-01-20: qty 0.5

## 2015-01-20 NOTE — Progress Notes (Signed)
Patient Name: William Villanueva Date of Encounter: 01/20/2015     Principal Problem:   Acute diastolic CHF (congestive heart failure), NYHA class 4 Active Problems:   Polycythemia vera   Back pain   Pacemaker-CRT   Enlarged prostate without lower urinary tract symptoms (luts)   Obesity   Orthostatic hypotension   Acute diastolic CHF (congestive heart failure)    SUBJECTIVE  Still SOB, worse than yesterday. No CP and no more dizziness   CURRENT MEDS . anagrelide  1 mg Oral Daily  . aspirin  81 mg Oral Daily  . carvedilol  25 mg Oral BID WC  . colestipol  1 g Oral BID  . febuxostat  40 mg Oral Daily  . finasteride  5 mg Oral Daily  . heparin  5,000 Units Subcutaneous 3 times per day  . [START ON 01/21/2015] Influenza vac split quadrivalent PF  0.5 mL Intramuscular Tomorrow-1000  . irbesartan  150 mg Oral Daily  . pantoprazole  40 mg Oral Daily  . [START ON 01/21/2015] pneumococcal 23 valent vaccine  0.5 mL Intramuscular Tomorrow-1000  . sodium chloride  3 mL Intravenous Q12H    OBJECTIVE  Filed Vitals:   01/19/15 2120 01/20/15 0013 01/20/15 0459 01/20/15 0747  BP: 120/84 112/65 113/72 131/78  Pulse: 80 81 83 83  Temp: 97.8 F (36.6 C) 97.3 F (36.3 C) 97.6 F (36.4 C)   TempSrc: Oral Oral Oral   Resp: 18 18 19 18   Height:      Weight:   267 lb 3.2 oz (121.201 kg)   SpO2: 98% 97% 98% 95%    Intake/Output Summary (Last 24 hours) at 01/20/15 1025 Last data filed at 01/20/15 0800  Gross per 24 hour  Intake    600 ml  Output   3600 ml  Net  -3000 ml   Filed Weights   01/19/15 0858 01/19/15 1458 01/20/15 0459  Weight: 274 lb 4.8 oz (124.422 kg) 270 lb (122.471 kg) 267 lb 3.2 oz (121.201 kg)    PHYSICAL EXAM  General: Pleasant, NAD. Neuro: Alert and oriented X 3. Moves all extremities spontaneously. Psych: Normal affect. HEENT:  Normal  Neck: Supple without bruits or JVD. Lungs:  Resp regular and unlabored, CTA. Heart: RRR no s3, s4, or  murmurs. Abdomen: Soft, non-tender, non-distended, BS + x 4.  Extremities: No clubbing, cyanosis or edema. DP/PT/Radials 2+ and equal bilaterally.  Accessory Clinical Findings  CBC  Recent Labs  01/19/15 0908 01/20/15 0320  WBC 12.7* 12.3*  NEUTROABS 8.4*  --   HGB 12.1* 12.3*  HCT 41.2 40.5  MCV 74.5* 74.4*  PLT 478* 979*   Basic Metabolic Panel  Recent Labs  01/19/15 0908 01/20/15 0320  NA 138 140  K 4.3 3.9  CL 111 108  CO2 20* 22  GLUCOSE 128* 113*  BUN 21* 34*  CREATININE 1.33* 1.63*  CALCIUM 9.0 8.9    Radiology/Studies  Dg Chest 2 View  01/19/2015   CLINICAL DATA:  Short of breath. Chest pain for 1 week. Productive cough.  EXAM: CHEST  2 VIEW  COMPARISON:  10/12/2014.  06/21/2014.  01/11/2012.  FINDINGS: Unchanged 3 lead LEFT subclavian cardiac pacemaker with coronary sinus lead. Mild CHF is present with interstitial pulmonary edema. No pleural effusions. No focal consolidation. 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  IMPRESSION: Mild CHF with interstitial pulmonary edema.   Electronically Signed   By: Dereck Ligas M.D.   On: 01/19/2015 09:46    ASSESSMENT  AND PLAN  64 y/o male with h/o high degree AVB + NICM with improvement in EF from 35% to 50-55% after CRTP insertion, chronic diastolic CHF and h/o orthostatic hypotension, presenting to the ED with complaints of dyspnea, orthopnea and PND after self discontinuation of his lasix.   1. Acute on Chronic Diastolic CHF: initially presented with complaint of DOE, orthopnea and PND over the last 2 days after holding his lasix. BNP 1200 and CXR with mild pulmonary edema.  -- Cardiomyopathy and LBBB with intraventricular dyssynchrony, s/p CRT-P placement with improved EF to 50-55% in 06/2014. Recent nuclear stress test with no ischemia in 06/2014 -- Placed on IV lasix. Net neg 3L. Weight down 7 lbs ( 274--> 267lbs). -- Still short of breath despite considerable diuresis. SOB may be due to restrictive  lung disease. Creat started to bump, will switch to oral lasix 40mg  qd today. Continue to monitor  2. Orthostatic Hypotension: patient notes history of this as well as recent symptoms, prompting him to hold his lasix. His BP in the ED is stable at 121/81. Orthostatics were checked in the ED. He has mild drops in systolic BP with position changes but SBP did not drop below 90 mmhg in the ED and he remained asymptomatic. He is on multiple antihypertensives at pretty high doses including Coreg, irbesartan, Imdur, lasix and tamsulosin for BPH. Given improvement in EF, and issues with orthostatic hypotenion, may need to lower doses to prevent recurrence.  -- Isosorbide was discontinued and tamsulosin switched to finasteride - these meds may be contributing to orthostasis. -- Will repeat orthostatics today.   3. Leukocytosis: WBC elevated at 12.7--> 12.3. He is afebrile and denies fever, chills and dysuria. CXR w/o infiltrate.   4. Chronic renal insufficiency: SCr 1.33 on admit, which is at his baseline. His creat bumped to 1.63. Will convert him to PO 40mg  qd lasix today     Signed, Eileen Stanford PA-C  Pager [phone]

## 2015-01-20 NOTE — Research (Signed)
[email or web address]  Informed Consent   Subject Name: William Villanueva  Subject met inclusion and exclusion criteria.  The informed consent form, study requirements and expectations were reviewed with the subject and questions and concerns were addressed prior to the signing of the consent form.  The subject verbalized understanding of the trail requirements.  The subject agreed to participate in the [email or web address]  trial and signed the informed consent.  The informed consent was obtained prior to performance of any protocol-specific procedures for the subject.  A copy of the signed informed consent was given to the subject and a copy was placed in the subject's medical record.  Sandie Ano 01/20/2015, 11:45

## 2015-01-20 NOTE — Telephone Encounter (Signed)
This encounter is complete per Bevelyn Buckles, CMA.

## 2015-01-20 NOTE — Telephone Encounter (Signed)
I cannot close this encounter. When I try to close it, a message comes up that says Glyn Ade has an incomplete note.

## 2015-01-21 ENCOUNTER — Other Ambulatory Visit: Payer: Self-pay | Admitting: Physician Assistant

## 2015-01-21 DIAGNOSIS — R0602 Shortness of breath: Secondary | ICD-10-CM | POA: Diagnosis not present

## 2015-01-21 DIAGNOSIS — I951 Orthostatic hypotension: Secondary | ICD-10-CM | POA: Diagnosis not present

## 2015-01-21 DIAGNOSIS — E669 Obesity, unspecified: Secondary | ICD-10-CM | POA: Diagnosis not present

## 2015-01-21 DIAGNOSIS — N179 Acute kidney failure, unspecified: Secondary | ICD-10-CM

## 2015-01-21 LAB — BASIC METABOLIC PANEL
Anion gap: 9 (ref 5–15)
BUN: 30 mg/dL — ABNORMAL HIGH (ref 6–20)
CO2: 26 mmol/L (ref 22–32)
Calcium: 9 mg/dL (ref 8.9–10.3)
Chloride: 105 mmol/L (ref 101–111)
Creatinine, Ser: 1.75 mg/dL — ABNORMAL HIGH (ref 0.61–1.24)
GFR calc Af Amer: 46 mL/min — ABNORMAL LOW (ref >60)
GFR calc non Af Amer: 40 mL/min — ABNORMAL LOW (ref >60)
Glucose, Bld: 100 mg/dL — ABNORMAL HIGH (ref 65–99)
Potassium: 4.4 mmol/L (ref 3.5–5.1)
Sodium: 140 mmol/L (ref 135–145)

## 2015-01-21 MED ORDER — FINASTERIDE 5 MG PO TABS: 5.0000 mg | ORAL_TABLET | Freq: Every day | ORAL | Status: DC

## 2015-01-21 MED ORDER — FUROSEMIDE 40 MG PO TABS: 40.0000 mg | ORAL_TABLET | ORAL | Status: DC

## 2015-01-21 NOTE — Discharge Instructions (Signed)
*  Weigh yourself on the same scale at same time of day and keep a log. *Report weight gain of > 2 lbs in 1 day or 5 lbs over the course of a week and/or symptoms of excess fluid (shortness of breath, difficulty lying flat, swelling, poor appetite, abdominal fullness/bloating, etc) to your doctor immediately. *Avoid foods that are high in sodium (processed, pre-packaged/canned goods, fast foods, etc). *Please attend all scheduled and reccommended follow up appointments

## 2015-01-21 NOTE — Discharge Summary (Signed)
Discharge Summary   Patient ID: William Villanueva,  MRN: [id number], DOB/AGE: 06/30/1950 64 y.o.  Admit date: 01/19/2015 Discharge date: 01/21/2015  Primary Physician: Gara Kroner, MD Primary Cardiologist: Dr. Stanford Breed Electrophysiologist: Dr. Caryl Comes  Discharge Diagnoses Principal Problem:   Acute diastolic CHF (congestive heart failure), NYHA class 4 Active Problems:   Polycythemia vera   Back pain   Pacemaker-CRT   Enlarged prostate without lower urinary tract symptoms (luts)   Obesity   Orthostatic hypotension   Acute diastolic CHF (congestive heart failure)   Shortness of breath   Allergies Allergies  Allergen Reactions  . Prednisone Other (See Comments)    Abdominal pain  . Wellbutrin [Bupropion] Hives    Procedures  None  History of Present Illness  64 y/o male, followed by Dr. Stanford Breed, with a h/o nonischemic cardiomyopathy/prior h/o systolic dysfunction and chronic diastolic CHF. EF in 2013 was 35% but cardiac cath showed nonobstructive CAD. Cardiac MRI in September of 2013 showed an ejection fraction of 34% with diffuse hypokinesis. There was no hyperenhancement or scar tissue and no evidence of cardiac hemochromatosis. In 2015, he developed high degree AV block and subsequently underwent insertion of a CRTP device, which is followed by Dr. Caryl Comes. His EF has since improved. 2D echo 06/2014 showed an EF of 50-55%. His last nuclear study was also 06/2014 which was negative for ischemia. Additional medical problems include orthostatic hypotension and he has a tendency to self discontinue any medications that may cause hypotension when symptoms arise.  He recently developed symtoms of orthostatic hypotension and self discontinued his lasix for several days. Subsequently, this lead to the development of dyspnea. He notes increased DOE, orthopnea/PND and a dry cough. He also notes mild chest pressure, but only when he lays down. No exertional CP.  He initially presented  to our [facility] office 01/19/15, but due to his labored breathing was sent to the ED for further management. orthostatic by BP.   CXR shows mild CHF with interstitial pulmonary edema. No pleural effusion or consolidation. BNP is elevated at 1222. His SCr is 1.33 which appears to be close to his baseline. EKG is unchanged compared to prior EKGs. He was given IV lasix in the ED. He noted good UOP since and symptoms have already improved.   Hospital Course  Orthostatic by BP on admission, case discussed with primary cardiologist Dr. Stanford Breed. Discontinued isosorbide and switched tamsulosin over to finasteride as these meds may be contributing to orthostasis. WBC elevated at 12.7 however he was afebrile and denied fever, chills and dysuria. CXR w/o infiltrate. Not started on Abx. Diuresed 3 Liter on IV lasix and 9lb weight loss (274-->265lb), however creatinine increased to 1.75. Orthostatics were WNL. Breathing improved.   He has been seen by Dr. Oval Linsey today and deemed ready for discharge home. All follow-up appointments have been scheduled. Discharge medications are listed below.   He will get BMET early next week. Irbesartan was continued during admission and at discharge as creatinine was close to baseline. Held lasix on day of discharge and he will start lasix every other day starting 01/22/15.   Discharge Vitals Blood pressure 128/79, pulse 82, temperature 97.6 F (36.4 C), temperature source Oral, resp. rate 20, height 5\' 10"  (1.778 m), weight 265 lb 10.5 oz (120.5 kg), SpO2 96 %.  Filed Weights   01/20/15 0459 01/21/15 0629 01/21/15 1000  Weight: 267 lb 3.2 oz (121.201 kg) 265 lb 11.2 oz (120.521 kg) 265 lb 10.5 oz (120.5 kg)  Labs  CBC  Recent Labs  01/19/15 0908 01/20/15 0320  WBC 12.7* 12.3*  NEUTROABS 8.4*  --   HGB 12.1* 12.3*  HCT 41.2 40.5  MCV 74.5* 74.4*  PLT 478* 875*   Basic Metabolic Panel  Recent Labs  01/20/15 0320 01/21/15 0347  NA 140 140  K 3.9  4.4  CL 108 105  CO2 22 26  GLUCOSE 113* 100*  BUN 34* 30*  CREATININE 1.63* 1.75*  CALCIUM 8.9 9.0    Disposition  Pt is being discharged home today in good condition.  Follow-up Plans & Appointments  Follow-up Information    Follow up with [facility]. Go on 02/01/2015.   Why:  @8 :00 For cardiology follow up with Lonn Georgia, PA-C   Contact information:   [address] [place] [address] [phone]      Follow up with [facility]. Go on 01/25/2015.   Specialty:  Cardiology   Why:  For lab drawn - BMP between 7:30am to 5 pm   Contact information:   [address] [place] [phone]      Follow up with Gara Kroner, MD.   Specialty:  Family Medicine   Why:  for nutritional follow up   Contact information:   [place] [address] [phone]           Discharge Instructions    Diet - low sodium heart healthy    Complete by:  As directed      Discharge instructions    Complete by:  As directed   Limit fluid intake to 1.5 L, sodium to 1500mg  daily Contact you PCP to set up Nutritionist follow up     Increase activity slowly    Complete by:  As directed            F/u Labs/Studies: Bmet   Discharge Medications    Medication List    STOP taking these medications        isosorbide mononitrate 30 MG 24 hr tablet  Commonly known as:  IMDUR     tamsulosin 0.4 MG Caps capsule  Commonly known as:  FLOMAX      TAKE these medications        anagrelide 1 MG capsule  Commonly known as:  AGRYLIN  Take 2 capsules a day alternating with 1 capsule a day.     aspirin 81 MG tablet  Take 81 mg by mouth daily.     carvedilol 25 MG tablet  Commonly known as:  COREG  Take 1 tablet (25 mg total) by mouth 2 (two) times daily with a meal.     colestipol 1 G tablet  Commonly known as:  COLESTID  Take 1 tablet (1 g total) by  mouth 2 (two) times daily.     dicyclomine 20 MG tablet  Commonly known as:  BENTYL  Take 20 mg by mouth 2 (two) times daily as needed (for stomach spasms).     finasteride 5 MG tablet  Commonly known as:  PROSCAR  Take 1 tablet (5 mg total) by mouth daily.     furosemide 40 MG tablet  Commonly known as:  LASIX  Take 1 tablet (40 mg total) by mouth every other day.  Start taking on:  01/22/2015     ibuprofen 200 MG tablet  Commonly known as:  ADVIL,MOTRIN  Take 400 mg by mouth every 6 (  six) hours as needed (for pain).     irbesartan 150 MG tablet  Commonly known as:  AVAPRO  Take 150 mg by mouth daily.     omeprazole 20 MG capsule  Commonly known as:  PRILOSEC  Take 20 mg by mouth daily.     polyvinyl alcohol 1.4 % ophthalmic solution  Commonly known as:  LIQUIFILM TEARS  Place 1-2 drops into both eyes as needed for dry eyes.     temazepam 22.5 MG capsule  Commonly known as:  RESTORIL  Take 1 capsule (22.5 mg total) by mouth at bedtime as needed for sleep.     ULORIC 40 MG tablet  Generic drug:  febuxostat  TAKE 1 TABLET BY MOUTH DAILY.        Duration of Discharge Encounter   Greater than 30 minutes including physician time.  Signed, Brittny Spangle PA-C 01/21/2015, 2:18 PM

## 2015-01-21 NOTE — Research (Signed)
ReDS Vest Discharge Study  This patient has been excluded from the study due to BMI > 36   Thank You   The research team

## 2015-01-21 NOTE — Progress Notes (Signed)
Pt got discharged, discharge instructions provided and patient showed understanding to it, pt is following up with PCP in 1 week, pt left the hospital in a wheelchair in a stable condition with all of his belongings.

## 2015-01-21 NOTE — Progress Notes (Signed)
Patient Name: William Villanueva Date of Encounter: 01/21/2015     Principal Problem:   Acute diastolic CHF (congestive heart failure), NYHA class 4 Active Problems:   Polycythemia vera   Back pain   Pacemaker-CRT   Enlarged prostate without lower urinary tract symptoms (luts)   Obesity   Orthostatic hypotension   Acute diastolic CHF (congestive heart failure)   Shortness of breath    SUBJECTIVE  SOB improved.  Has not walked yet today.  No CP and no more dizziness.  Orthostatics were wnl  CURRENT MEDS . anagrelide  1 mg Oral Daily  . aspirin  81 mg Oral Daily  . carvedilol  25 mg Oral BID WC  . colestipol  1 g Oral BID  . febuxostat  40 mg Oral Daily  . finasteride  5 mg Oral Daily  . furosemide  40 mg Oral Daily  . heparin  5,000 Units Subcutaneous 3 times per day  . Influenza vac split quadrivalent PF  0.5 mL Intramuscular Tomorrow-1000  . irbesartan  150 mg Oral Daily  . pantoprazole  40 mg Oral Daily  . pneumococcal 23 valent vaccine  0.5 mL Intramuscular Tomorrow-1000  . sodium chloride  3 mL Intravenous Q12H    OBJECTIVE  Filed Vitals:   01/20/15 1609 01/20/15 2119 01/21/15 0629 01/21/15 0829  BP: 136/69 124/89 100/70 128/79  Pulse:  78 80 76  Temp: 97.8 F (36.6 C) 97.6 F (36.4 C) 97.6 F (36.4 C)   TempSrc: Oral Oral Oral   Resp:  18 20   Height:      Weight:   120.521 kg (265 lb 11.2 oz)   SpO2: 96% 96% 97% 96%    Intake/Output Summary (Last 24 hours) at 01/21/15 0837 Last data filed at 01/21/15 0823  Gross per 24 hour  Intake    960 ml  Output    975 ml  Net    -15 ml   Filed Weights   01/19/15 1458 01/20/15 0459 01/21/15 0629  Weight: 122.471 kg (270 lb) 121.201 kg (267 lb 3.2 oz) 120.521 kg (265 lb 11.2 oz)    PHYSICAL EXAM  General: Pleasant, NAD. Neuro: Alert and oriented X 3. Moves all extremities spontaneously. Psych: Normal affect. HEENT:  Normal  Neck: Supple without bruits or JVD. Lungs:  Resp regular and unlabored,  CTA. Heart: RRR no s3, s4, or murmurs. Abdomen: Soft, non-tender, non-distended, BS + x 4.  Extremities: No clubbing, cyanosis or edema. DP/PT/Radials 2+ and equal bilaterally.  Accessory Clinical Findings  CBC  Recent Labs  01/19/15 0908 01/20/15 0320  WBC 12.7* 12.3*  NEUTROABS 8.4*  --   HGB 12.1* 12.3*  HCT 41.2 40.5  MCV 74.5* 74.4*  PLT 478* 119*   Basic Metabolic Panel  Recent Labs  01/20/15 0320 01/21/15 0347  NA 140 140  K 3.9 4.4  CL 108 105  CO2 22 26  GLUCOSE 113* 100*  BUN 34* 30*  CREATININE 1.63* 1.75*  CALCIUM 8.9 9.0    Radiology/Studies  Dg Chest 2 View  01/19/2015   CLINICAL DATA:  Short of breath. Chest pain for 1 week. Productive cough.  EXAM: CHEST  2 VIEW  COMPARISON:  10/12/2014.  06/21/2014.  01/11/2012.  FINDINGS: Unchanged 3 lead LEFT subclavian cardiac pacemaker with coronary sinus lead. Mild CHF is present with interstitial pulmonary edema. No pleural effusions. No focal consolidation. 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  IMPRESSION: Mild CHF with interstitial pulmonary edema.   Electronically  Signed   By: Dereck Ligas M.D.   On: 01/19/2015 09:46    ASSESSMENT AND PLAN  64 y/o male with h/o high degree AVB + NICM with improvement in EF from 35% to 50-55% after CRTP insertion, chronic diastolic CHF and h/o orthostatic hypotension, presenting to the ED with complaints of dyspnea, orthopnea and PND after self discontinuation of his lasix.   1. Acute on Chronic Diastolic CHF: initially presented with complaint of DOE, orthopnea and PND over the last 2 days after holding his lasix. BNP 1200 and CXR with mild pulmonary edema. Improved with IV diuresis initially followed by lasix 89m daily.  Symptoms now improved.  Creatinine slightly higher today but BUN coming down.   -- Cardiomyopathy and LBBB with intraventricular dyssynchrony, s/p CRT-P placement with improved EF to 50-55% in 06/2014. Recent nuclear stress test with no  ischemia in 06/2014 -- Placed on IV lasix. Net neg 3L. Weight down 9 lb ( 274--> 265lbs). -- SOB may also be due to restrictive lung disease, obesity and deconditioning as he still has some SOB when euvolemic.  -- Will need BMP early next week -- check weight daily -- Limit fluid intake to 1.5 L, sodium to 15017mdaily -- Nutritionist follow up -- Lasix 4068mo every other day, first dose starting tomorrow. (Holding lasix today given rising creatinine)  2. Orthostatic Hypotension: Resolved.  Switched tamsulosin to finasteride and stopped isosorbide mononitrate.  Patient was no longer orthostatic when checked 12/21/14. When checked in the ED he had mild drops in systolic BP with position changes but SBP did not drop below 90 mmhg in the ED and he remained asymptomatic-- Isosorbide was discontinued and tamsulosin switched to finasteride   3. Leukocytosis: WBC elevated at 12.7--> 12.3. He is afebrile and denies fever, chills and dysuria. CXR w/o infiltrate.   4. Chronic renal insufficiency: SCr 1.33 on admit, which is at his baseline. His creat bumped to 1.75. Holding lasix today and will start every other day lasix 25m37mmorrow.  BMP check early next week.    Signed, RandSharol Harness

## 2015-01-22 ENCOUNTER — Telehealth: Payer: Self-pay | Admitting: Cardiology

## 2015-01-22 NOTE — Telephone Encounter (Signed)
D/C phone call.[name] Appt on 02/01/15 at 8am  w/ Rosaria Ferries PA ay [facility] office .   Thanks

## 2015-01-22 NOTE — Telephone Encounter (Signed)
Patient contacted regarding discharge from [facility] on 01/21/2015.  Patient understands to follow up with provider Rosaria Ferries on 02/01/2015 at 8:00 at [facility] office. Patient understands discharge instructions? Yes Patient understands medications and regiment? No, pt stated he's going to his family doctor next week because he needs to know why he is taking all this medication Patient understands to bring all medications to this visit? Yes   Pt stated he's having a very hard time sleeping, told patient when he go to his primary care doctor next week to speak to him about it and see if he can help him with some sleeping pills

## 2015-01-25 ENCOUNTER — Other Ambulatory Visit (INDEPENDENT_AMBULATORY_CARE_PROVIDER_SITE_OTHER): Payer: 59 | Admitting: *Deleted

## 2015-01-25 DIAGNOSIS — N179 Acute kidney failure, unspecified: Secondary | ICD-10-CM | POA: Diagnosis not present

## 2015-01-25 LAB — BASIC METABOLIC PANEL
BUN: 22 mg/dL (ref 6–23)
CO2: 25 mEq/L (ref 19–32)
Calcium: 8.9 mg/dL (ref 8.4–10.5)
Chloride: 108 mEq/L (ref 96–112)
Creatinine, Ser: 1.23 mg/dL (ref 0.40–1.50)
GFR: 63.04 mL/min (ref >60.00)
Glucose, Bld: 118 mg/dL — ABNORMAL HIGH (ref 70–99)
Potassium: 4.1 mEq/L (ref 3.5–5.1)
Sodium: 141 mEq/L (ref 135–145)

## 2015-01-29 ENCOUNTER — Other Ambulatory Visit (HOSPITAL_BASED_OUTPATIENT_CLINIC_OR_DEPARTMENT_OTHER): Payer: 59

## 2015-01-29 ENCOUNTER — Encounter: Payer: Self-pay | Admitting: Hematology & Oncology

## 2015-01-29 ENCOUNTER — Ambulatory Visit (HOSPITAL_BASED_OUTPATIENT_CLINIC_OR_DEPARTMENT_OTHER): Payer: 59 | Admitting: Hematology & Oncology

## 2015-01-29 VITALS — BP 122/61 | HR 72 | Temp 97.2°F | Resp 18 | Ht 70.0 in | Wt 273.0 lb

## 2015-01-29 DIAGNOSIS — D45 Polycythemia vera: Secondary | ICD-10-CM | POA: Diagnosis not present

## 2015-01-29 LAB — CBC WITH DIFFERENTIAL (CANCER CENTER ONLY)
BASO#: 0.2 10*3/uL (ref 0.0–0.2)
BASO%: 2 % (ref 0.0–2.0)
EOS%: 4.6 % (ref 0.0–7.0)
Eosinophils Absolute: 0.5 10*3/uL (ref 0.0–0.5)
HCT: 42.6 % (ref 38.7–49.9)
HGB: 12.8 g/dL — ABNORMAL LOW (ref 13.0–17.1)
LYMPH#: 2.2 10*3/uL (ref 0.9–3.3)
LYMPH%: 18.5 % (ref 14.0–48.0)
MCH: 22.3 pg — ABNORMAL LOW (ref 28.0–33.4)
MCHC: 30 g/dL — ABNORMAL LOW (ref 32.0–35.9)
MCV: 74 fL — ABNORMAL LOW (ref 82–98)
MONO#: 1 10*3/uL — ABNORMAL HIGH (ref 0.1–0.9)
MONO%: 8.3 % (ref 0.0–13.0)
NEUT#: 7.9 10*3/uL — ABNORMAL HIGH (ref 1.5–6.5)
NEUT%: 66.6 % (ref 40.0–80.0)
Platelets: 662 10*3/uL — ABNORMAL HIGH (ref 145–400)
RBC: 5.75 10*6/uL — ABNORMAL HIGH (ref 4.20–5.70)
RDW: 19.3 % — ABNORMAL HIGH (ref 11.1–15.7)
WBC: 11.9 10*3/uL — ABNORMAL HIGH (ref 4.0–10.0)

## 2015-01-29 LAB — TECHNOLOGIST REVIEW CHCC SATELLITE

## 2015-01-29 LAB — LACTATE DEHYDROGENASE: LDH: 262 U/L — ABNORMAL HIGH (ref 94–250)

## 2015-01-29 LAB — IRON AND TIBC CHCC
%SAT: 5 % — ABNORMAL LOW (ref 20–55)
Iron: 22 ug/dL — ABNORMAL LOW (ref 42–163)
TIBC: 414 ug/dL — ABNORMAL HIGH (ref 202–409)
UIBC: 392 ug/dL — ABNORMAL HIGH (ref 117–376)

## 2015-01-29 LAB — COMPREHENSIVE METABOLIC PANEL
ALT: 23 U/L (ref 9–46)
AST: 25 U/L (ref 10–35)
Albumin: 3.9 g/dL (ref 3.6–5.1)
Alkaline Phosphatase: 90 U/L (ref 40–115)
BUN: 28 mg/dL — ABNORMAL HIGH (ref 7–25)
CO2: 21 mmol/L (ref 20–31)
Calcium: 8.9 mg/dL (ref 8.6–10.3)
Chloride: 107 mmol/L (ref 98–110)
Creatinine, Ser: 1.47 mg/dL — ABNORMAL HIGH (ref 0.70–1.25)
Glucose, Bld: 184 mg/dL — ABNORMAL HIGH (ref 65–99)
Potassium: 5 mmol/L (ref 3.5–5.3)
Sodium: 137 mmol/L (ref 135–146)
Total Bilirubin: 0.6 mg/dL (ref 0.2–1.2)
Total Protein: 6.8 g/dL (ref 6.1–8.1)

## 2015-01-29 LAB — CHCC SATELLITE - SMEAR

## 2015-01-29 LAB — FERRITIN CHCC: Ferritin: 23 ng/ml (ref 22–316)

## 2015-01-29 NOTE — Progress Notes (Signed)
Hematology and Oncology Follow Up Visit  William Villanueva [id number] Oct 08, 1950 64 y.o. 01/29/2015   Principle Diagnosis:   Polycythemia vera-JAK2 positive  Heart block-Mobitz II  Current Therapy:    Anagrelide 1 mg by mouth daily alternating with 2 mg a day - on hold after today  Aspirin 81 mg by mouth daily  Phlebotomy to maintain hematocrit below 45%     Interim History:  [name] is back for followup. He is having a little bit of a tough time. His back in the hospital last week. He really has some congestive heart failure. He does have a pacemaker in place.  He just does not have a lot of stamina. He does not have a lot of energy.  He thinks that he is on too many medications. He thinks that his medications are interacting.  I do know that anagrelide can cause some issues with heart failure. As such, I told him that we can stop the anagrelide for right now. His platelet count, I don't think, should be much of a problem. Studies have shown that anagrelide can cause some cardiomyopathy. However, this is reversible.  If we do run into problems with him, I was put him on [place].  His back is bothering him. I think his weight has admitted do with this.  He is not able to exercise although much.  He's had no problems with bowels or bladder.  He's had no rashes.  Overall, his performance status is ECOG 1.   Medications:  Current outpatient prescriptions:  .  anagrelide (AGRYLIN) 1 MG capsule, Take 2 capsules a day alternating with 1 capsule a day., Disp: 60 capsule, Rfl: 6 .  aspirin 81 MG tablet, Take 81 mg by mouth daily., Disp: , Rfl:  .  carvedilol (COREG) 25 MG tablet, Take 1 tablet (25 mg total) by mouth 2 (two) times daily with a meal., Disp: 60 tablet, Rfl: 10 .  colestipol (COLESTID) 1 G tablet, Take 1 tablet (1 g total) by mouth 2 (two) times daily. (Patient taking differently: Take 1 g by mouth 2 (two) times daily as needed (for diarrhea symptoms). ), Disp: 60  tablet, Rfl: 1 .  dicyclomine (BENTYL) 20 MG tablet, Take 20 mg by mouth 2 (two) times daily as needed (for stomach spasms). , Disp: , Rfl:  .  furosemide (LASIX) 40 MG tablet, Take 1 tablet (40 mg total) by mouth every other day., Disp: 90 tablet, Rfl: 3 .  ibuprofen (ADVIL,MOTRIN) 200 MG tablet, Take 400 mg by mouth every 6 (six) hours as needed (for pain)., Disp: , Rfl:  .  irbesartan (AVAPRO) 150 MG tablet, Take 150 mg by mouth daily. , Disp: , Rfl:  .  isosorbide mononitrate (IMDUR) 30 MG 24 hr tablet, , Disp: , Rfl:  .  omeprazole (PRILOSEC) 20 MG capsule, Take 20 mg by mouth daily. , Disp: , Rfl:  .  polyvinyl alcohol (LIQUIFILM TEARS) 1.4 % ophthalmic solution, Place 1-2 drops into both eyes as needed for dry eyes., Disp: , Rfl:  .  tamsulosin (FLOMAX) 0.4 MG CAPS capsule, , Disp: , Rfl:  .  temazepam (RESTORIL) 22.5 MG capsule, Take 1 capsule (22.5 mg total) by mouth at bedtime as needed for sleep., Disp: 30 capsule, Rfl: 2 .  ULORIC 40 MG tablet, TAKE 1 TABLET BY MOUTH DAILY., Disp: 30 tablet, Rfl: 6 .  finasteride (PROSCAR) 5 MG tablet, Take 1 tablet (5 mg total) by mouth daily. (Patient not taking: Reported on  01/29/2015), Disp: 30 tablet, Rfl: 6 No current facility-administered medications for this visit.  Facility-Administered Medications Ordered in Other Visits:  .  0.9 %  sodium chloride infusion, , Intravenous, Continuous, Volanda Napoleon, MD, Stopped at 05/16/13 1115  Allergies:  Allergies  Allergen Reactions  . Prednisone Other (See Comments)    Abdominal pain  . Wellbutrin [Bupropion] Hives    Past Medical History, Surgical history, Social history, and Family History were reviewed and updated.  Review of Systems: As above  Physical Exam:  height is 5\' 10"  (1.778 m) and weight is 273 lb (123.832 kg). His oral temperature is 97.2 F (36.2 C). His blood pressure is 122/61 and his pulse is 72. His respiration is 18.   Obese white male in no obvious distress. There is  no adenopathy in the neck. Lungs are clear. Cardiac exam regular rate and rhythm. He's an occasional extra beat. He has no murmurs, rubs or bruits. Abdomen is obese but soft. Has good bowel sounds. There is no fluid wave. There is a palpable liver or spleen tip. Back exam shows a well healed laminectomy scar in the lumbar spine. Extremities shows no clubbing cyanosis or edema. He has good range of motion of his joints. He has good strength. Skin exam no rashes, ecchymoses or petechia. Neurological exam is nonfocal.  Lab Results  Component Value Date   WBC 11.9* 01/29/2015   HGB 12.8* 01/29/2015   HCT 42.6 01/29/2015   MCV 74* 01/29/2015   PLT 662* 01/29/2015     Chemistry      Component Value Date/Time   NA 141 01/25/2015 0826   NA 135 10/29/2014 0849   K 4.1 01/25/2015 0826   K 3.8 10/29/2014 0849   CL 108 01/25/2015 0826   CL 103 10/29/2014 0849   CO2 25 01/25/2015 0826   CO2 24 10/29/2014 0849   BUN 22 01/25/2015 0826   BUN 12 10/29/2014 0849   CREATININE 1.23 01/25/2015 0826   CREATININE 1.6* 10/29/2014 0849      Component Value Date/Time   CALCIUM 8.9 01/25/2015 0826   CALCIUM 8.6 10/29/2014 0849   ALKPHOS 80 10/29/2014 0849   ALKPHOS 81 07/30/2014 0819   AST 24 10/29/2014 0849   AST 17 07/30/2014 0819   ALT 18 10/29/2014 0849   ALT 15 07/30/2014 0819   BILITOT 1.00 10/29/2014 0849   BILITOT 0.5 07/30/2014 0819         Impression and Plan: William Villanueva is 64 year old gentleman with polycythemia vera. He does not need be phlebotomized today.  We will go ahead and stop the anagrelide for right now. I think that this might improve his situation. Again, if we need to, weak and was switched over to a different medication for his platelets.  I will see him back in 6 weeks.  I spent about 30 minutes with he and his wife today.    Volanda Napoleon, MD 9/23/20169:07 AM

## 2015-02-01 ENCOUNTER — Telehealth: Payer: Self-pay | Admitting: Cardiology

## 2015-02-01 ENCOUNTER — Ambulatory Visit (INDEPENDENT_AMBULATORY_CARE_PROVIDER_SITE_OTHER): Payer: 59 | Admitting: Physician Assistant

## 2015-02-01 ENCOUNTER — Encounter: Payer: Self-pay | Admitting: Physician Assistant

## 2015-02-01 VITALS — BP 110/74 | HR 70 | Ht 70.0 in | Wt 276.3 lb

## 2015-02-01 DIAGNOSIS — D45 Polycythemia vera: Secondary | ICD-10-CM

## 2015-02-01 DIAGNOSIS — I5032 Chronic diastolic (congestive) heart failure: Secondary | ICD-10-CM

## 2015-02-01 DIAGNOSIS — I420 Dilated cardiomyopathy: Secondary | ICD-10-CM

## 2015-02-01 MED ORDER — OMEPRAZOLE 40 MG PO CPDR: 40.0000 mg | DELAYED_RELEASE_CAPSULE | Freq: Every day | ORAL | Status: DC

## 2015-02-01 NOTE — Progress Notes (Signed)
Cardiology Office Note   Date:  02/01/2015   ID:  William Villanueva, DOB Oct 18, 1950, MRN [id number]  PCP:  Gara Kroner, MD  Cardiologist:  Dr Alcide Evener, PA-C   Chief Complaint  Patient presents with  . Follow-up    Post Hospital:  SOB all the time, occas. ankle edema.  No dizziness or chest pain.  Difficulty sleeping.  Under a lot of stress due to workers comp.    History of Present Illness: William Villanueva is a 64 y.o. male with a history of NICM, S-D-CHF, 2nd AVB>> St. Jude PPM  William Villanueva presents for post hospital follow-up.   Since discharge, his weight has not changed very much. He continues to complain of exertional dyspnea but is not short of breath at rest. He has not developed any lower extremity edema he was having prior to his hospitalization. His shortness of breath is not as bad as it was before he went the hospital, but is not where he would like to be. 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  He is under a great deal of stress because of the prolonged Worker's Compensation case he is involved with. Financially, he is very stressed because of the situation. He has not been able to work in over 2 years.  His appetite is better and he has been eating more. He may have gained a pound or 2 in weight, but does not think he has put on any fluid.  He drinks 4 pint bottles of water daily. He also drinks other liquids. He is compliant with his medications but wonders if some of his fatigue is coming from medication interactions. Dr. Marin Olp took him off anagrelide at his last visit.  Past Medical History  Diagnosis Date  . Polycythemia vera(238.4)   . Back pain   . Hemochromatosis   . Gout   . Hypertension   . Nephrolithiasis   . Cardiomyopathy     Nonischemic 45%.   [name] Hypospadias Sep 10, 1950    born with  . CHF (congestive heart failure)   . AV block, 2nd degree 2015    St. Jude  Allure Quadra pulse generator [id number], model PM [id number]   Past Surgical History  Procedure Laterality Date  . Ankle surgery    . Pilonidal cyst excision    . Tonsillectomy    . Bi-ventricular pacemaker insertion N/A 04/29/2014    Procedure: BI-VENTRICULAR PACEMAKER INSERTION (CRT-P);  Surgeon: Deboraha Sprang, MD; Laterality: Left  St. Jude Allure Quadra pulse generator [phone], model PM [phone]    Current Outpatient Prescriptions  Medication Sig Dispense Refill  . aspirin 81 MG tablet Take 81 mg by mouth daily.    . carvedilol (COREG) 25 MG tablet Take 1 tablet (25 mg total) by mouth 2 (two) times daily with a meal. 60 tablet 10  . colestipol (COLESTID) 1 G tablet Take 1 tablet (1 g total) by mouth 2 (two) times daily. (Patient taking differently: Take 1 g by mouth 2 (two) times daily as needed (for diarrhea symptoms). ) 60 tablet 1  . dicyclomine (BENTYL) 20 MG tablet Take 20 mg by mouth 2 (two) times daily as needed (for stomach spasms).     . furosemide (LASIX) 40 MG tablet Take 1 tablet (40 mg total) by mouth every other day. 90 tablet 3  . ibuprofen (ADVIL,MOTRIN) 200 MG tablet Take 400 mg by mouth every 6 (  six) hours as needed (for pain).    . irbesartan (AVAPRO) 150 MG tablet Take 150 mg by mouth daily.     . isosorbide mononitrate (IMDUR) 30 MG 24 hr tablet     . omeprazole (PRILOSEC) 20 MG capsule Take 20 mg by mouth daily.     . polyvinyl alcohol (LIQUIFILM TEARS) 1.4 % ophthalmic solution Place 1-2 drops into both eyes as needed for dry eyes.    . tamsulosin (FLOMAX) 0.4 MG CAPS capsule     . temazepam (RESTORIL) 22.5 MG capsule Take 1 capsule (22.5 mg total) by mouth at bedtime as needed for sleep. 30 capsule 2  . ULORIC 40 MG tablet TAKE 1 TABLET BY MOUTH DAILY. 30 tablet 6   Allergies:   Prednisone and Wellbutrin   Social History:  The patient  reports that he quit smoking about 21 months ago. His smoking use included Cigarettes. He started smoking about 46 years ago. He has a 44  pack-year smoking history. He has never used smokeless tobacco. He reports that he drinks alcohol. He reports that he does not use illicit drugs.   Family History:  The patient's family history includes Heart disease in his maternal grandmother; Stroke in his mother.   ROS:  Please see the history of present illness. All other systems are reviewed and negative.   PHYSICAL EXAM: VS:  BP 110/74 mmHg  Pulse 70  Ht 5' 10"  (1.778 m)  Wt 276 lb 4.8 oz (125.329 kg)  BMI 39.64 kg/m2 , BMI Body mass index is 39.64 kg/(m^2). GEN: Well nourished, well developed, in no acute distress HEENT: normal Neck: no JVD, carotid bruits, or masses. Minimal hepatojugular reflux Cardiac: RRR; soft systolic murmur, rubs, or gallops, trace pedal edema  Respiratory:  clear to auscultation bilaterally, normal work of breathing GI: soft, nontender, nondistended, + BS MS: no deformity or atrophy Skin: warm and dry, no rash Neuro:  Strength and sensation are intact Psych: euthymic mood, full affect   EKG:  EKG is not ordered today.  Echo: 06/2014 - Left ventricle: The cavity size was mildly dilated. Wall thickness was normal. Systolic function was normal. The estimated ejection fraction was in the range of 50% to 55%. Doppler parameters are consistent with abnormal left ventricular relaxation (grade 1 diastolic dysfunction). - Left atrium: The atrium was moderately dilated. - Pulmonary arteries: PA peak pressure: 38 mm Hg (S). Impressions: - Poor acoustic windows, even with echo contrast, limit study  Recent Labs: 04/28/2014: TSH 0.424 01/19/2015: B Natriuretic Peptide 1222.8* 01/29/2015: ALT 23; BUN 28*; Creatinine, Ser 1.47*; HGB 12.8*; Platelets 662*; Potassium 5.0; Sodium 137    Lipid Panel    Component Value Date/Time   CHOL 126 04/29/2014 0139   TRIG 146 04/29/2014 0139   HDL 29* 04/29/2014 0139   CHOLHDL 4.3 04/29/2014 0139   VLDL 29 04/29/2014 0139   LDLCALC 68 04/29/2014 0139       Wt Readings from Last 3 Encounters:  02/01/15 276 lb 4.8 oz (125.329 kg)  01/29/15 273 lb (123.832 kg)  01/21/15 265 lb 10.5 oz (120.5 kg)     Other studies Reviewed: Additional studies/ records that were reviewed today include: Previous office visits, hospital records, lab work and echo.  ASSESSMENT AND PLAN:  1.  Chronic systolic and diastolic CHF: His EF had recovered at last echo in February 2016.  His weight is up 3 pounds from his office visit with Dr. Lelon Frohlich of her end up 11 pounds from his hospital  discharge weight. However, he has been eating well recently and feels that he has gained weight but does not feel that he has put on fluid. He is sleeping poorly but is compliant with this CPAP. He has problems with nasal stuffiness at night and was using Flonase for this. I encouraged him to use the Flonase every night as it was more of a preventative thin treatment medication. He is compliant with Lasix.  Advised him that drinking 4, 1 L bottles of water daily was okay but he should not also drink coffee etc. with this. Discussed that any time he drinks a cup or glass of something, he should pour an equivalent amount out of the water bottle. He is struggling with dietary restrictions, but they are searching for things that he can eat and like without sodium. They are limiting eating out and not eating at fast food restaurants.  Advised him that increasing his activity in any way that he can would be beneficial and discussed ways to do that.  2. Medication questions: He and his wife are concerned that medication interactions are contributing to him feeling poorly. I discussed the reasons for the medications and reviewed the fact that his vital signs were normal and so I did not wish to change any of the cardiac drugs. However, the isosorbide was recently added and if he wishes to hold this and see if he will feel better, that is okay. He is not taking large amounts of ibuprofen. We discussed his  GI symptoms and medications and he is to try increasing the Prilosec and decreasing the dicyclomine, go back to previous dosing if this does not work.  Current medicines are reviewed at length with the patient today.  The patient has concerns regarding medicines. All were addressed  The following changes have been made:  Discontinue isosorbide, change dicyclomine to daily and see how tolerated, okay to increase Prilosec to 40 mg daily  Labs/ tests ordered today include:  BMET on Thursday since K+ 5.0 and BUN/Cr  28/1.47 on 09/23.   Disposition:   FU with Dr. Stanford Breed is scheduled   Signed, Lenoard Aden  02/01/2015 9:57 AM    [address], [place], [place]  [address] Phone: [phone]; Fax: [phone]

## 2015-02-01 NOTE — Telephone Encounter (Signed)
Spoke with pt, he needs to have a bmp Thursday this week. Order placed and Patient voiced understanding

## 2015-02-01 NOTE — Patient Instructions (Signed)
Your physician recommends that you schedule a follow-up appointment in: AS SCHEDULED WITH DR CRENSHAW  STOP ISOSORBIDE  INCREASE OMEPRAZOLE TO 40 MG ONCE DAILY= 2 OF THE 20 MG TABLETS ONCE DAILY  DECREASE THE USE OF DICYCLOMINE AS TOLERATED

## 2015-02-01 NOTE — Telephone Encounter (Signed)
Pt's wife is returning a nurse's call . She wasn't sure on who left the message but she said she received a call from our office. Please call  Thanks

## 2015-02-02 LAB — BASIC METABOLIC PANEL
BUN: 26 mg/dL — ABNORMAL HIGH (ref 7–25)
CO2: 23 mmol/L (ref 20–31)
Calcium: 9.2 mg/dL (ref 8.6–10.3)
Chloride: 109 mmol/L (ref 98–110)
Creat: 1.36 mg/dL — ABNORMAL HIGH (ref 0.70–1.25)
Glucose, Bld: 109 mg/dL — ABNORMAL HIGH (ref 65–99)
Potassium: 4.6 mmol/L (ref 3.5–5.3)
Sodium: 139 mmol/L (ref 135–146)

## 2015-02-08 ENCOUNTER — Ambulatory Visit (INDEPENDENT_AMBULATORY_CARE_PROVIDER_SITE_OTHER): Payer: 59 | Admitting: *Deleted

## 2015-02-08 DIAGNOSIS — I441 Atrioventricular block, second degree: Secondary | ICD-10-CM

## 2015-02-08 DIAGNOSIS — I5032 Chronic diastolic (congestive) heart failure: Secondary | ICD-10-CM

## 2015-02-09 NOTE — Progress Notes (Signed)
Remote pacemaker transmission.   

## 2015-02-11 LAB — CUP PACEART REMOTE DEVICE CHECK
Battery Remaining Longevity: 71 mo
Battery Remaining Percentage: 95.5 %
Battery Voltage: 2.98 V
Brady Statistic AP VP Percent: 1.2 %
Brady Statistic AP VS Percent: 1 %
Brady Statistic AS VP Percent: 98 %
Brady Statistic AS VS Percent: 1 %
Brady Statistic RA Percent Paced: 1 %
Date Time Interrogation Session: 20161003084819
Lead Channel Impedance Value: 430 Ohm
Lead Channel Impedance Value: 510 Ohm
Lead Channel Impedance Value: 680 Ohm
Lead Channel Pacing Threshold Amplitude: 0.75 V
Lead Channel Pacing Threshold Amplitude: 0.75 V
Lead Channel Pacing Threshold Amplitude: 1.5 V
Lead Channel Pacing Threshold Pulse Width: 0.4 ms
Lead Channel Pacing Threshold Pulse Width: 0.4 ms
Lead Channel Pacing Threshold Pulse Width: 0.7 ms
Lead Channel Sensing Intrinsic Amplitude: 12 mV
Lead Channel Sensing Intrinsic Amplitude: 4.6 mV
Lead Channel Setting Pacing Amplitude: 2 V
Lead Channel Setting Pacing Amplitude: 2.5 V
Lead Channel Setting Pacing Amplitude: 5 V
Lead Channel Setting Pacing Pulse Width: 0.4 ms
Lead Channel Setting Pacing Pulse Width: 0.7 ms
Lead Channel Setting Sensing Sensitivity: 2 mV
Pulse Gen Model: 3242
Pulse Gen Serial Number: 7701275

## 2015-02-15 ENCOUNTER — Other Ambulatory Visit: Payer: Self-pay | Admitting: Hematology & Oncology

## 2015-02-23 NOTE — Progress Notes (Signed)
HPI: FU cardiomyopathy. Echocardiogram in August of 2013 showed an ejection fraction of 35-40%. Cardiac catheterization in September of 2013 showed mild nonobstructive coronary disease. There was no hemodynamic evidence of restriction. Pulmonary capillary wedge pressure 10. Cardiac MRI in September of 2013 showed an ejection fraction of 34% with diffuse hypokinesis. There was no hyperenhancement or scar tissue and no evidence of cardiac hemochromatosis. TSH in September 2013 normal. 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 Patient subsequently had CRTP placed. Echo 2/16 showed EF 46-96, grade 1 diastolic dysfunction, moderate LAE. Nuclear study 2/16 showed EF 35, no ischemia. CTA 2/16 showed no PE. Admitted September 2016 with acute on chronic combined systolic/diastolic congestive heart failure. Treated with Lasix with improvement.Since last seen, He does have dyspnea on exertion but no orthopnea, PND, pedal edema, chest pain or syncope.  Current Outpatient Prescriptions  Medication Sig Dispense Refill  . aspirin 81 MG tablet Take 81 mg by mouth daily.    . carvedilol (COREG) 25 MG tablet Take 1 tablet (25 mg total) by mouth 2 (two) times daily with a meal. 60 tablet 10  . citalopram (CELEXA) 10 MG tablet Take 10 mg by mouth daily.  1  . colestipol (COLESTID) 1 G tablet Take 1 tablet (1 g total) by mouth 2 (two) times daily. (Patient taking differently: Take 1 g by mouth 2 (two) times daily as needed (for diarrhea symptoms). ) 60 tablet 1  . dicyclomine (BENTYL) 20 MG tablet Take 20 mg by mouth 2 (two) times daily as needed (for stomach spasms).     . furosemide (LASIX) 40 MG tablet Take 1 tablet (40 mg total) by mouth every other day. 90 tablet 3  . ibuprofen (ADVIL,MOTRIN) 200 MG tablet Take 400 mg by mouth every 6 (six) hours as needed (for pain).    . irbesartan (AVAPRO) 150 MG tablet Take 150 mg by mouth daily.     [name] omeprazole (PRILOSEC) 40 MG capsule Take 1 capsule  (40 mg total) by mouth daily. 90 capsule 3  . polyvinyl alcohol (LIQUIFILM TEARS) 1.4 % ophthalmic solution Place 1-2 drops into both eyes as needed for dry eyes.    . tamsulosin (FLOMAX) 0.4 MG CAPS capsule     . temazepam (RESTORIL) 22.5 MG capsule TAKE 1 CAPSULE BY MOUTH AT BEDTIME AS NEEDED FOR SLEEP. 30 capsule 0  . ULORIC 40 MG tablet TAKE 1 TABLET BY MOUTH DAILY. 30 tablet 6   No current facility-administered medications for this visit.   Facility-Administered Medications Ordered in Other Visits  Medication Dose Route Frequency Provider Last Rate Last Dose  . 0.9 %  sodium chloride infusion   Intravenous Continuous Volanda Napoleon, MD   Stopped at 05/16/13 1115     Past Medical History  Diagnosis Date  . Polycythemia vera(238.4)   . Back pain   . Hemochromatosis   . Gout   . Hypertension   . Nephrolithiasis   . Cardiomyopathy ([place])     Nonischemic 45%.   [name] Hypospadias 13-Jul-1950    born with  . CHF (congestive heart failure) ([place])   . AV block, 2nd degree 2015    St. Jude Allure Quadra pulse generator [id number], model PM [id number]    Past Surgical History  Procedure Laterality Date  . Ankle surgery    . Pilonidal cyst excision    . Tonsillectomy    . Bi-ventricular pacemaker insertion N/A 04/29/2014    Procedure: BI-VENTRICULAR PACEMAKER INSERTION (CRT-P);  Surgeon: Deboraha Sprang, MD; Laterality: Left  St. Jude Allure Quadra pulse generator [id number], model PM [id number]    Social History   Social History  . Marital Status: Married    Spouse Name: N/A  . Number of Children: N/A  . Years of Education: N/A   Occupational History  . Not on file.   Social History Main Topics  . Smoking status: Former Smoker -- 1.00 packs/day for 44 years    Types: Cigarettes    Start date: 06/05/1968    Quit date: 04/05/2013  . Smokeless tobacco: Never Used     Comment: quit 2 years ago  . Alcohol Use: 0.0 oz/week    0 Standard drinks or equivalent per week     Comment: Occasional  .  Drug Use: No  . Sexual Activity: Not on file   Other Topics Concern  . Not on file   Social History Narrative   Lives at home with wife.     ROS: no fevers or chills, productive cough, hemoptysis, dysphasia, odynophagia, melena, hematochezia, dysuria, hematuria, rash, seizure activity, orthopnea, PND, pedal edema, claudication. Remaining systems are negative.  Physical Exam: Well-developed obese in no acute distress.  Skin is warm and dry.  HEENT is normal.  Neck is supple.  Chest is clear to auscultation with normal expansion.  Cardiovascular exam is regular rate and rhythm.  Abdominal exam nontender or distended. No masses palpated. Extremities show no edema. neuro grossly intact

## 2015-03-01 ENCOUNTER — Ambulatory Visit (INDEPENDENT_AMBULATORY_CARE_PROVIDER_SITE_OTHER): Payer: 59 | Admitting: Cardiology

## 2015-03-01 ENCOUNTER — Encounter: Payer: Self-pay | Admitting: Cardiology

## 2015-03-01 VITALS — BP 130/70 | HR 70 | Ht 70.0 in | Wt 278.3 lb

## 2015-03-01 DIAGNOSIS — R0602 Shortness of breath: Secondary | ICD-10-CM

## 2015-03-01 MED ORDER — FUROSEMIDE 40 MG PO TABS: 20.0000 mg | ORAL_TABLET | Freq: Every day | ORAL | Status: DC

## 2015-03-01 MED ORDER — SPIRONOLACTONE 25 MG PO TABS: 12.5000 mg | ORAL_TABLET | Freq: Every day | ORAL | Status: DC

## 2015-03-01 NOTE — Assessment & Plan Note (Signed)
Volume status appears to be good. We discussed the importance of low sodium diet and fluid restriction. I will change his Lasix to 20 mg daily. Add spironolactone 12.5 mg daily. Check potassium, renal function and BNP in 5 days. He will weigh himself daily and take an additional 20 mg of Lasix for weight gain of 2-3 pounds.

## 2015-03-01 NOTE — Patient Instructions (Signed)
Medication Instructions:   START FUROSEMIDE 20 MG ONCE DAILY  START SPIRONOLACTONE 12.5 MG ONCE DAILY= 1/2 OF 25 MG TABLET ONCE DAILY  Labwork:  Your physician recommends that you return for lab work ON Friday 03-05-15  Follow-Up:  Your physician recommends that you schedule a follow-up appointment in: [place]   If you need a refill on your cardiac medications before your next appointment, please call your pharmacy.

## 2015-03-01 NOTE — Assessment & Plan Note (Signed)
Discussed weight loss.

## 2015-03-01 NOTE — Assessment & Plan Note (Signed)
Blood pressure controlled. Continue present medications. 

## 2015-03-01 NOTE — Assessment & Plan Note (Signed)
Followed by electrophysiology. 

## 2015-03-01 NOTE — Assessment & Plan Note (Signed)
Continue ARB and beta blocker. 

## 2015-03-03 ENCOUNTER — Telehealth: Payer: Self-pay | Admitting: Cardiology

## 2015-03-03 NOTE — Telephone Encounter (Signed)
Left message for pt of dr crenshaw's recommendations  

## 2015-03-03 NOTE — Telephone Encounter (Signed)
Agree; watch weight and symptoms; call if increasing dyspnea, edema or weight gain. Kirk Ruths

## 2015-03-03 NOTE — Telephone Encounter (Signed)
Spoke to patient or wife. Patient states he is not urinating as much as before,since he changed lasix 20 mg  Daily. Patient was concerned. He states he is weigh daily only 2 lbs up from dry weight. No shortness of breath. RN informed patient at last visit Dr Stanford Breed thought his fluid volume was okay , that's why he reduce lasix dose and add spirolactone. continue to do daily weights if >3lbs in one day or >5 lbs in one week contact office,increase shortness of breath Patient and wife verbalized understanding.  RN will defer to Dr Stanford Breed to see if anything else is needed.

## 2015-03-03 NOTE — Telephone Encounter (Signed)
Pt's wife called in stating that Dr. Stanford Breed changed his Lasix medication to where he is not taking 64m instead of 426mevery other day. Due to the change he isnt's urinating as frequently as he was before, she is concerned and would like to speak with a nurse.   Thanks

## 2015-03-06 LAB — BASIC METABOLIC PANEL
BUN: 20 mg/dL (ref 7–25)
CO2: 24 mmol/L (ref 20–31)
Calcium: 8.9 mg/dL (ref 8.6–10.3)
Chloride: 105 mmol/L (ref 98–110)
Creat: 1.29 mg/dL — ABNORMAL HIGH (ref 0.70–1.25)
Glucose, Bld: 148 mg/dL — ABNORMAL HIGH (ref 65–99)
Potassium: 5 mmol/L (ref 3.5–5.3)
Sodium: 139 mmol/L (ref 135–146)

## 2015-03-06 LAB — BRAIN NATRIURETIC PEPTIDE: Brain Natriuretic Peptide: 584.3 pg/mL — ABNORMAL HIGH (ref 0.0–100.0)

## 2015-03-08 ENCOUNTER — Encounter: Payer: Self-pay | Admitting: *Deleted

## 2015-03-08 ENCOUNTER — Telehealth: Payer: Self-pay | Admitting: *Deleted

## 2015-03-08 DIAGNOSIS — I5043 Acute on chronic combined systolic (congestive) and diastolic (congestive) heart failure: Secondary | ICD-10-CM

## 2015-03-08 NOTE — Telephone Encounter (Signed)
This encounter was created in error - please disregard.

## 2015-03-08 NOTE — Telephone Encounter (Signed)
-----   Message from Lelon Perla, MD sent at 03/06/2015  7:52 PM EDT ----- Change lasix to 40 mg daily, bmet one week Kirk Ruths

## 2015-03-08 NOTE — Telephone Encounter (Signed)
Spoke with pt, he voiced understanding of medication change. Lab orders placed.

## 2015-03-15 ENCOUNTER — Encounter: Payer: Self-pay | Admitting: Hematology & Oncology

## 2015-03-15 ENCOUNTER — Ambulatory Visit: Payer: Self-pay

## 2015-03-15 ENCOUNTER — Ambulatory Visit (HOSPITAL_BASED_OUTPATIENT_CLINIC_OR_DEPARTMENT_OTHER): Payer: 59 | Admitting: Hematology & Oncology

## 2015-03-15 ENCOUNTER — Telehealth: Payer: Self-pay | Admitting: Cardiology

## 2015-03-15 ENCOUNTER — Other Ambulatory Visit: Payer: Self-pay

## 2015-03-15 ENCOUNTER — Other Ambulatory Visit (HOSPITAL_BASED_OUTPATIENT_CLINIC_OR_DEPARTMENT_OTHER): Payer: 59

## 2015-03-15 ENCOUNTER — Other Ambulatory Visit: Payer: Self-pay | Admitting: Hematology & Oncology

## 2015-03-15 VITALS — BP 117/57 | HR 70 | Temp 97.3°F | Resp 16 | Ht 70.0 in | Wt 277.0 lb

## 2015-03-15 DIAGNOSIS — D45 Polycythemia vera: Secondary | ICD-10-CM

## 2015-03-15 DIAGNOSIS — I429 Cardiomyopathy, unspecified: Secondary | ICD-10-CM

## 2015-03-15 LAB — CBC WITH DIFFERENTIAL (CANCER CENTER ONLY)
BASO#: 0.3 10*3/uL — ABNORMAL HIGH (ref 0.0–0.2)
BASO%: 3.1 % — ABNORMAL HIGH (ref 0.0–2.0)
EOS%: 4.7 % (ref 0.0–7.0)
Eosinophils Absolute: 0.5 10*3/uL (ref 0.0–0.5)
HCT: 44.5 % (ref 38.7–49.9)
HGB: 13.3 g/dL (ref 13.0–17.1)
LYMPH#: 1.8 10*3/uL (ref 0.9–3.3)
LYMPH%: 17.5 % (ref 14.0–48.0)
MCH: 21.9 pg — ABNORMAL LOW (ref 28.0–33.4)
MCHC: 29.9 g/dL — ABNORMAL LOW (ref 32.0–35.9)
MCV: 73 fL — ABNORMAL LOW (ref 82–98)
MONO#: 0.9 10*3/uL (ref 0.1–0.9)
MONO%: 8.3 % (ref 0.0–13.0)
NEUT#: 7 10*3/uL — ABNORMAL HIGH (ref 1.5–6.5)
NEUT%: 66.4 % (ref 40.0–80.0)
Platelets: 963 10*3/uL — ABNORMAL HIGH (ref 145–400)
RBC: 6.08 10*6/uL — ABNORMAL HIGH (ref 4.20–5.70)
RDW: 18.9 % — ABNORMAL HIGH (ref 11.1–15.7)
WBC: 10.5 10*3/uL — ABNORMAL HIGH (ref 4.0–10.0)

## 2015-03-15 LAB — CHCC SATELLITE - SMEAR

## 2015-03-15 LAB — COMPREHENSIVE METABOLIC PANEL (CC13)
ALT: 20 U/L (ref 0–55)
AST: 21 U/L (ref 5–34)
Albumin: 3.7 g/dL (ref 3.5–5.0)
Alkaline Phosphatase: 89 U/L (ref 40–150)
Anion Gap: 11 mEq/L (ref 3–11)
BUN: 23.8 mg/dL (ref 7.0–26.0)
CO2: 17 mEq/L — ABNORMAL LOW (ref 22–29)
Calcium: 8.9 mg/dL (ref 8.4–10.4)
Chloride: 109 mEq/L (ref 98–109)
Creatinine: 1.4 mg/dL — ABNORMAL HIGH (ref 0.7–1.3)
EGFR: 55 mL/min/{1.73_m2} — ABNORMAL LOW (ref >90)
Glucose: 192 mg/dl — ABNORMAL HIGH (ref 70–140)
Potassium: 4.2 mEq/L (ref 3.5–5.1)
Sodium: 137 mEq/L (ref 136–145)
Total Bilirubin: 0.62 mg/dL (ref 0.20–1.20)
Total Protein: 7 g/dL (ref 6.4–8.3)

## 2015-03-15 LAB — RETICULOCYTES (CHCC)
ABS Retic: 66.9 10*3/uL (ref 19.0–186.0)
RBC.: 6.08 MIL/uL — ABNORMAL HIGH (ref 4.22–5.81)
Retic Ct Pct: 1.1 % (ref 0.4–2.3)

## 2015-03-15 LAB — LACTATE DEHYDROGENASE (CC13): LDH: 259 U/L — ABNORMAL HIGH (ref 125–245)

## 2015-03-15 LAB — IRON AND TIBC CHCC
%SAT: 6 % — ABNORMAL LOW (ref 20–55)
Iron: 23 ug/dL — ABNORMAL LOW (ref 42–163)
TIBC: 423 ug/dL — ABNORMAL HIGH (ref 202–409)
UIBC: 399 ug/dL — ABNORMAL HIGH (ref 117–376)

## 2015-03-15 LAB — FERRITIN CHCC: Ferritin: 16 ng/ml — ABNORMAL LOW (ref 22–316)

## 2015-03-15 MED ORDER — HYDROXYUREA 500 MG PO CAPS: 500.0000 mg | ORAL_CAPSULE | Freq: Three times a day (TID) | ORAL | Status: DC

## 2015-03-15 NOTE — Progress Notes (Signed)
Hematology and Oncology Follow Up Visit  William Villanueva [id number] 12/01/1950 64 y.o. 03/15/2015   Principle Diagnosis:   Polycythemia vera-JAK2 positive  Heart block-Mobitz II  Current Therapy:    Hydrea 545m po TID  Aspirin 81 mg by mouth daily  Phlebotomy to maintain hematocrit below 45%     Interim History:  [name] back for followup. He is having a little bit of a tough time. His back is causing more problems. He has surgery back in June 2015. This helped temporarily but now is back is hurting him more. He is going for acupuncture. He is going for injections.  He also has seen Dr. CStanford Breedof cardiology. He had a heart block. He had a pacemaker placed. I'm not sure if this was from the anagrelide he was on. We stopped his anagrelide and to no surprise, his platelets are up.  I think I will try him on Hydrea. Even though he is JAK2 positive, I will like [facility] to [facility] and [facility]. I think Hydrea should help [place]. I will start him on Hydrea at 500 mg 3 times a day.  He is still incredibly bothered by the workman's comp. This is been a real thorn in his side. He really has had a hard time with them.  He's had no bleeding. He's had no issues with bowels or bladder. He's had no diarrhea. He's had no constipation.   Overall, his performance status is ECOG 1.   Medications:  Current outpatient prescriptions:  .  aspirin 81 MG tablet, Take 81 mg by mouth daily., Disp: , Rfl:  .  carvedilol (COREG) 25 MG tablet, Take 1 tablet (25 mg total) by mouth 2 (two) times daily with a meal., Disp: 60 tablet, Rfl: 10 .  citalopram (CELEXA) 10 MG tablet, Take 10 mg by mouth daily., Disp: , Rfl: 1 .  colestipol (COLESTID) 1 G tablet, Take 1 tablet (1 g total) by mouth 2 (two) times daily. (Patient taking differently: Take 1 g by mouth 2 (two) times daily as needed (for diarrhea symptoms). ), Disp: 60 tablet, Rfl: 1 .  dicyclomine (BENTYL) 20 MG tablet, Take  20 mg by mouth 2 (two) times daily as needed (for stomach spasms). , Disp: , Rfl:  .  furosemide (LASIX) 40 MG tablet, Take 0.5 tablets (20 mg total) by mouth daily., Disp: 90 tablet, Rfl: 3 .  ibuprofen (ADVIL,MOTRIN) 200 MG tablet, Take 400 mg by mouth every 6 (six) hours as needed (for pain)., Disp: , Rfl:  .  irbesartan (AVAPRO) 150 MG tablet, Take 150 mg by mouth daily. , Disp: , Rfl:  .  omeprazole (PRILOSEC) 40 MG capsule, Take 1 capsule (40 mg total) by mouth daily., Disp: 90 capsule, Rfl: 3 .  polyvinyl alcohol (LIQUIFILM TEARS) 1.4 % ophthalmic solution, Place 1-2 drops into both eyes as needed for dry eyes., Disp: , Rfl:  .  spironolactone (ALDACTONE) 25 MG tablet, Take 0.5 tablets (12.5 mg total) by mouth daily., Disp: 45 tablet, Rfl: 3 .  tamsulosin (FLOMAX) 0.4 MG CAPS capsule, , Disp: , Rfl:  .  temazepam (RESTORIL) 22.5 MG capsule, TAKE 1 CAPSULE BY MOUTH AT BEDTIME AS NEEDED FOR SLEEP., Disp: 30 capsule, Rfl: 0 .  ULORIC 40 MG tablet, TAKE 1 TABLET BY MOUTH DAILY., Disp: 30 tablet, Rfl: 6 .  hydroxyurea (HYDREA) 500 MG capsule, Take 1 capsule (500 mg total) by mouth 3 (three) times daily. May take with  food to minimize GI side effects., Disp: 90 capsule, Rfl: 3 No current facility-administered medications for this visit.  Facility-Administered Medications Ordered in Other Visits:  .  0.9 %  sodium chloride infusion, , Intravenous, Continuous, Volanda Napoleon, MD, Stopped at 05/16/13 1115  Allergies:  Allergies  Allergen Reactions  . Prednisone Other (See Comments)    Abdominal pain  . Wellbutrin [Bupropion] Hives    Past Medical History, Surgical history, Social history, and Family History were reviewed and updated.  Review of Systems: As above  Physical Exam:  height is 5' 10"  (1.778 m) and weight is 277 lb (125.646 kg). His oral temperature is 97.3 F (36.3 C). His blood pressure is 117/57 and his pulse is 70. His respiration is 16.   Obese white male in no obvious  distress. There is no adenopathy in the neck. Lungs are clear. Cardiac exam regular rate and rhythm. He has an occasional extra beat. He has no murmurs, rubs or bruits. Abdomen is obese but soft. Has good bowel sounds. There is no fluid wave. There is a palpable liver or spleen tip. Back exam shows a well healed laminectomy scar in the lumbar spine. Extremities shows no clubbing cyanosis or edema. He has good range of motion of his joints. He has good strength. Skin exam no rashes, ecchymoses or petechia. Neurological exam is nonfocal.  Lab Results  Component Value Date   WBC 10.5* 03/15/2015   HGB 13.3 03/15/2015   HCT 44.5 03/15/2015   MCV 73* 03/15/2015   PLT 963* 03/15/2015     Chemistry      Component Value Date/Time   NA 139 03/05/2015 0825   NA 135 10/29/2014 0849   K 5.0 03/05/2015 0825   K 3.8 10/29/2014 0849   CL 105 03/05/2015 0825   CL 103 10/29/2014 0849   CO2 24 03/05/2015 0825   CO2 24 10/29/2014 0849   BUN 20 03/05/2015 0825   BUN 12 10/29/2014 0849   CREATININE 1.29* 03/05/2015 0825   CREATININE 1.47* 01/29/2015 0829      Component Value Date/Time   CALCIUM 8.9 03/05/2015 0825   CALCIUM 8.6 10/29/2014 0849   ALKPHOS 90 01/29/2015 0829   ALKPHOS 80 10/29/2014 0849   AST 25 01/29/2015 0829   AST 24 10/29/2014 0849   ALT 23 01/29/2015 0829   ALT 18 10/29/2014 0849   BILITOT 0.6 01/29/2015 0829   BILITOT 1.00 10/29/2014 0849         Impression and Plan: William Villanueva is 64 year old gentleman with polycythemia vera. He does not need be phlebotomized today. He is close to needing a phlebotomy. However, he does tend to get vasovagal with phlebotomy so we tried to hold off if we can.  We will see how the Hydrea dose for him.  I will let see him back in 3 weeks. I want to make sure we stay in contact with him with the change to Hydrea.   I spent about 30 minutes with he and his wife today.    Volanda Napoleon, MD 11/7/201610:05 AM

## 2015-03-15 NOTE — Progress Notes (Signed)
No Phlebotomy  Today per m.d order.

## 2015-03-15 NOTE — Telephone Encounter (Signed)
Pt called,kept me on hold for 9 minutes.Did not need the encounter at this time.

## 2015-03-17 ENCOUNTER — Encounter: Payer: Self-pay | Admitting: Cardiology

## 2015-03-18 ENCOUNTER — Other Ambulatory Visit: Payer: Self-pay | Admitting: *Deleted

## 2015-03-22 ENCOUNTER — Telehealth: Payer: Self-pay | Admitting: Cardiology

## 2015-03-22 NOTE — Telephone Encounter (Signed)
Spoke with pt, he had repeat labs drawn with dr Marin Olp. Results forwarded for dr crenshaw's review. He is having flank pain and is not sure if related to kidney's or back. He denies burning with voiding, he does have frequency but is taking lasix. Will cont to monitor.

## 2015-03-22 NOTE — Telephone Encounter (Signed)
Pt had blood work from last Monday and would like his lab> The results were coming from Dr Marin Olp.Pt says his kidneys are bothering him,please call to advise.

## 2015-03-23 ENCOUNTER — Encounter: Payer: Self-pay | Admitting: Internal Medicine

## 2015-04-07 ENCOUNTER — Encounter: Payer: Self-pay | Admitting: Hematology & Oncology

## 2015-04-07 ENCOUNTER — Ambulatory Visit (HOSPITAL_BASED_OUTPATIENT_CLINIC_OR_DEPARTMENT_OTHER): Payer: 59 | Admitting: Hematology & Oncology

## 2015-04-07 ENCOUNTER — Encounter (HOSPITAL_BASED_OUTPATIENT_CLINIC_OR_DEPARTMENT_OTHER): Payer: 59 | Admitting: Hematology & Oncology

## 2015-04-07 VITALS — BP 107/67 | HR 64 | Temp 97.3°F | Resp 20 | Ht 70.0 in | Wt 276.0 lb

## 2015-04-07 DIAGNOSIS — D45 Polycythemia vera: Secondary | ICD-10-CM | POA: Diagnosis not present

## 2015-04-07 DIAGNOSIS — I429 Cardiomyopathy, unspecified: Secondary | ICD-10-CM

## 2015-04-07 DIAGNOSIS — G4701 Insomnia due to medical condition: Secondary | ICD-10-CM

## 2015-04-07 LAB — COMPREHENSIVE METABOLIC PANEL (CC13)
ALT: 22 U/L (ref 0–55)
AST: 26 U/L (ref 5–34)
Albumin: 3.7 g/dL (ref 3.5–5.0)
Alkaline Phosphatase: 85 U/L (ref 40–150)
Anion Gap: 7 mEq/L (ref 3–11)
BUN: 20.2 mg/dL (ref 7.0–26.0)
CO2: 27 mEq/L (ref 22–29)
Calcium: 9 mg/dL (ref 8.4–10.4)
Chloride: 106 mEq/L (ref 98–109)
Creatinine: 1.2 mg/dL (ref 0.7–1.3)
EGFR: 62 mL/min/{1.73_m2} — ABNORMAL LOW (ref >90)
Glucose: 82 mg/dl (ref 70–140)
Potassium: 4 mEq/L (ref 3.5–5.1)
Sodium: 140 mEq/L (ref 136–145)
Total Bilirubin: 1 mg/dL (ref 0.20–1.20)
Total Protein: 7.5 g/dL (ref 6.4–8.3)

## 2015-04-07 LAB — CBC WITH DIFFERENTIAL (CANCER CENTER ONLY)
BASO#: 0.2 10*3/uL (ref 0.0–0.2)
BASO%: 3.1 % — ABNORMAL HIGH (ref 0.0–2.0)
EOS%: 1.9 % (ref 0.0–7.0)
Eosinophils Absolute: 0.1 10*3/uL (ref 0.0–0.5)
HCT: 43.9 % (ref 38.7–49.9)
HGB: 13.7 g/dL (ref 13.0–17.1)
LYMPH#: 1.5 10*3/uL (ref 0.9–3.3)
LYMPH%: 30.3 % (ref 14.0–48.0)
MCH: 24 pg — ABNORMAL LOW (ref 28.0–33.4)
MCHC: 31.2 g/dL — ABNORMAL LOW (ref 32.0–35.9)
MCV: 77 fL — ABNORMAL LOW (ref 82–98)
MONO#: 0.5 10*3/uL (ref 0.1–0.9)
MONO%: 10.7 % (ref 0.0–13.0)
NEUT#: 2.6 10*3/uL (ref 1.5–6.5)
NEUT%: 54 % (ref 40.0–80.0)
Platelets: 143 10*3/uL — ABNORMAL LOW (ref 145–400)
RBC: 5.7 10*6/uL (ref 4.20–5.70)
RDW: 23.8 % — ABNORMAL HIGH (ref 11.1–15.7)
WBC: 4.9 10*3/uL (ref 4.0–10.0)

## 2015-04-07 MED ORDER — TEMAZEPAM 22.5 MG PO CAPS: ORAL_CAPSULE | ORAL | Status: DC

## 2015-04-07 NOTE — Progress Notes (Signed)
Hematology and Oncology Follow Up Visit  William Villanueva [id number] 12/30/1950 64 y.o. 04/07/2015   Principle Diagnosis:   Polycythemia vera-JAK2 positive  Heart block-Mobitz II  Current Therapy:    Hydrea 514m po q day  Aspirin 81 mg by mouth daily  Phlebotomy to maintain hematocrit below 45%     Interim History:  [name] back for followup. He really had no problems with the Hydrea. He had a little bit of loose stool. He take some Pepto-Bismol for this. This helped quite a bit.  He is still bothered by his back. I'm not sure if this will ever get better for him. He's tried acupuncture. It sounds like there may be some thinking of a epidural type TENS unit that might be tried.  He's had no cardiac issues. 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  He's had no rashes. He's had no flareups of gout.  He's had no fever.  He's had no leg swelling. He does take Lasix to help with fluid retention.   Overall, his performance status is ECOG 1.   Medications:  Current outpatient prescriptions:  .  aspirin 81 MG tablet, Take 81 mg by mouth daily., Disp: , Rfl:  .  carvedilol (COREG) 25 MG tablet, Take 1 tablet (25 mg total) by mouth 2 (two) times daily with a meal., Disp: 60 tablet, Rfl: 10 .  citalopram (CELEXA) 10 MG tablet, Take 10 mg by mouth daily., Disp: , Rfl: 1 .  dicyclomine (BENTYL) 20 MG tablet, Take 20 mg by mouth 2 (two) times daily as needed (for stomach spasms). , Disp: , Rfl:  .  furosemide (LASIX) 40 MG tablet, Take 0.5 tablets (20 mg total) by mouth daily., Disp: 90 tablet, Rfl: 3 .  hydroxyurea (HYDREA) 500 MG capsule, Take 1 capsule (500 mg total) by mouth 3 (three) times daily. May take with food to minimize GI side effects., Disp: 90 capsule, Rfl: 3 .  ibuprofen (ADVIL,MOTRIN) 200 MG tablet, Take 400 mg by mouth every 6 (six) hours as needed (for pain)., Disp: , Rfl:  .  irbesartan (AVAPRO) 150 MG tablet, Take 150 mg by mouth daily. , Disp: , Rfl:  .   omeprazole (PRILOSEC) 40 MG capsule, Take 1 capsule (40 mg total) by mouth daily., Disp: 90 capsule, Rfl: 3 .  polyvinyl alcohol (LIQUIFILM TEARS) 1.4 % ophthalmic solution, Place 1-2 drops into both eyes as needed for dry eyes., Disp: , Rfl:  .  silodosin (RAPAFLO) 8 MG CAPS capsule, , Disp: , Rfl:  .  spironolactone (ALDACTONE) 25 MG tablet, Take 0.5 tablets (12.5 mg total) by mouth daily., Disp: 45 tablet, Rfl: 3 .  tamsulosin (FLOMAX) 0.4 MG CAPS capsule, , Disp: , Rfl:  .  temazepam (RESTORIL) 22.5 MG capsule, TAKE 1 CAPSULE BY MOUTH AT BEDTIME AS NEEDED FOR SLEEP, Disp: 30 capsule, Rfl: 0 .  ULORIC 40 MG tablet, TAKE 1 TABLET BY MOUTH DAILY., Disp: 30 tablet, Rfl: 6 No current facility-administered medications for this visit.  Facility-Administered Medications Ordered in Other Visits:  .  0.9 %  sodium chloride infusion, , Intravenous, Continuous, PVolanda Napoleon MD, Stopped at 05/16/13 1115  Allergies:  Allergies  Allergen Reactions  . Prednisone Other (See Comments)    Abdominal pain  . Wellbutrin [Bupropion] Hives    Past Medical History, Surgical history, Social history, and Family History were reviewed and updated.  Review of Systems: As above  Physical Exam:  height is 5' 10"  (1.778 m)  and weight is 276 lb (125.193 kg). His oral temperature is 97.3 F (36.3 C). His blood pressure is 107/67 and his pulse is 64. His respiration is 20.   Obese white male in no obvious distress. There is no adenopathy in the neck. Lungs are clear. Cardiac exam regular rate and rhythm. He has an occasional extra beat. He has no murmurs, rubs or bruits. Abdomen is obese but soft. Has good bowel sounds. There is no fluid wave. There is a palpable liver or spleen tip. Back exam shows a well healed laminectomy scar in the lumbar spine. Extremities shows no clubbing cyanosis or edema. He has good range of motion of his joints. He has good strength. Skin exam no rashes, ecchymoses or petechia.  Neurological exam is nonfocal.  Lab Results  Component Value Date   WBC 4.9 04/07/2015   HGB 13.7 04/07/2015   HCT 43.9 04/07/2015   MCV 77* 04/07/2015   PLT 143* 04/07/2015     Chemistry      Component Value Date/Time   NA 137 03/15/2015 0903   NA 139 03/05/2015 0825   NA 135 10/29/2014 0849   K 4.2 03/15/2015 0903   K 5.0 03/05/2015 0825   K 3.8 10/29/2014 0849   CL 105 03/05/2015 0825   CL 103 10/29/2014 0849   CO2 17* 03/15/2015 0903   CO2 24 03/05/2015 0825   CO2 24 10/29/2014 0849   BUN 23.8 03/15/2015 0903   BUN 20 03/05/2015 0825   BUN 12 10/29/2014 0849   CREATININE 1.4* 03/15/2015 0903   CREATININE 1.29* 03/05/2015 0825   CREATININE 1.47* 01/29/2015 0829      Component Value Date/Time   CALCIUM 8.9 03/15/2015 0903   CALCIUM 8.9 03/05/2015 0825   CALCIUM 8.6 10/29/2014 0849   ALKPHOS 89 03/15/2015 0903   ALKPHOS 90 01/29/2015 0829   ALKPHOS 80 10/29/2014 0849   AST 21 03/15/2015 0903   AST 25 01/29/2015 0829   AST 24 10/29/2014 0849   ALT 20 03/15/2015 0903   ALT 23 01/29/2015 0829   ALT 18 10/29/2014 0849   BILITOT 0.62 03/15/2015 0903   BILITOT 0.6 01/29/2015 0829   BILITOT 1.00 10/29/2014 0849         Impression and Plan: [name] is 64 year old gentleman with polycythemia vera. He does not need be phlebotomized today.  I will cut his Hydrea dose down to 500 mg a day. I did this to be very reasonable for him.  I just wish that we would be able to help his poor back elevated more.  I spent about 30 minutes with he and his wife today.   I will plan to see him back in about 3-4 weeks.   Volanda Napoleon, MD 11/30/20162:06 PM

## 2015-04-08 LAB — BRAIN NATRIURETIC PEPTIDE: Brain Natriuretic Peptide: 184.7 pg/mL — ABNORMAL HIGH (ref 0.0–100.0)

## 2015-04-09 NOTE — Progress Notes (Signed)
HPI: FU cardiomyopathy. Echocardiogram in August of 2013 showed an ejection fraction of 35-40%. Cardiac catheterization in September of 2013 showed mild nonobstructive coronary disease. There was no hemodynamic evidence of restriction. Pulmonary capillary wedge pressure 10. Cardiac MRI in September of 2013 showed an ejection fraction of 34% with diffuse hypokinesis. There was no hyperenhancement or scar tissue and no evidence of cardiac hemochromatosis. TSH in September 2013 normal. 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 Patient subsequently had CRTP placed. Echo 2/16 showed EF 24-23, grade 1 diastolic dysfunction, moderate LAE. Nuclear study 2/16 showed EF 35, no ischemia. CTA 2/16 showed no PE. Since last seen, He has fatigue but denies significant dyspnea, chest pain, palpitations, syncope or edema.  Current Outpatient Prescriptions  Medication Sig Dispense Refill  . aspirin 81 MG tablet Take 81 mg by mouth daily.    . carvedilol (COREG) 25 MG tablet Take 1 tablet (25 mg total) by mouth 2 (two) times daily with a meal. 60 tablet 10  . citalopram (CELEXA) 10 MG tablet Take 10 mg by mouth daily.  1  . dicyclomine (BENTYL) 20 MG tablet Take 20 mg by mouth 2 (two) times daily as needed (for stomach spasms).     . furosemide (LASIX) 40 MG tablet Take 0.5 tablets (20 mg total) by mouth daily. 90 tablet 3  . hydroxyurea (HYDREA) 500 MG capsule Take 500 mg by mouth daily. May take with food to minimize GI side effects.    [name] ibuprofen (ADVIL,MOTRIN) 200 MG tablet Take 400 mg by mouth every 6 (six) hours as needed (for pain).    . irbesartan (AVAPRO) 150 MG tablet Take 150 mg by mouth daily.     . isosorbide mononitrate (IMDUR) 30 MG 24 hr tablet Take 1 tablet by mouth daily.    [name] omeprazole (PRILOSEC) 20 MG capsule Take 1 capsule by mouth daily.    . polyvinyl alcohol (LIQUIFILM TEARS) 1.4 % ophthalmic solution Place 1-2 drops into both eyes as needed for dry eyes.    .  silodosin (RAPAFLO) 8 MG CAPS capsule Take 8 mg by mouth daily with breakfast.     . spironolactone (ALDACTONE) 25 MG tablet Take 0.5 tablets (12.5 mg total) by mouth daily. 45 tablet 3  . temazepam (RESTORIL) 22.5 MG capsule TAKE 1 CAPSULE BY MOUTH AT BEDTIME AS NEEDED FOR SLEEP 30 capsule 0  . ULORIC 40 MG tablet TAKE 1 TABLET BY MOUTH DAILY. 30 tablet 6   No current facility-administered medications for this visit.   Facility-Administered Medications Ordered in Other Visits  Medication Dose Route Frequency Provider Last Rate Last Dose  . 0.9 %  sodium chloride infusion   Intravenous Continuous Volanda Napoleon, MD   Stopped at 05/16/13 1115     Past Medical History  Diagnosis Date  . Polycythemia vera(238.4)   . Back pain   . Hemochromatosis   . Gout   . Hypertension   . Nephrolithiasis   . Cardiomyopathy ([place])     Nonischemic 45%.   [name] Hypospadias 1950-11-07    born with  . CHF (congestive heart failure) ([place])   . AV block, 2nd degree 2015    St. Jude Allure Quadra pulse generator [id number], model PM [id number]    Past Surgical History  Procedure Laterality Date  . Ankle surgery    . Pilonidal cyst excision    . Tonsillectomy    . Bi-ventricular pacemaker insertion N/A 04/29/2014    Procedure:  BI-VENTRICULAR PACEMAKER INSERTION (CRT-P);  Surgeon: Deboraha Sprang, MD; Laterality: Left  St. Jude Allure Quadra pulse generator [id number], model PM [id number]    Social History   Social History  . Marital Status: Married    Spouse Name: N/A  . Number of Children: N/A  . Years of Education: N/A   Occupational History  . Not on file.   Social History Main Topics  . Smoking status: Former Smoker -- 1.00 packs/day for 44 years    Types: Cigarettes    Start date: 06/05/1968    Quit date: 04/05/2013  . Smokeless tobacco: Never Used     Comment: quit 2 years ago  . Alcohol Use: 0.0 oz/week    0 Standard drinks or equivalent per week     Comment: Occasional  . Drug Use: No  . Sexual  Activity: Not on file   Other Topics Concern  . Not on file   Social History Narrative   Lives at home with wife.     ROS: no fevers or chills, productive cough, hemoptysis, dysphasia, odynophagia, melena, hematochezia, dysuria, hematuria, rash, seizure activity, orthopnea, PND, pedal edema, claudication. Remaining systems are negative.  Physical Exam: Well-developed obese in no acute distress.  Skin is warm and dry.  HEENT is normal.  Neck is supple.  Chest is clear to auscultation with normal expansion.  Cardiovascular exam is regular rate and rhythm.  Abdominal exam nontender or distended. No masses palpated. Extremities show no edema. neuro grossly intact

## 2015-04-13 ENCOUNTER — Ambulatory Visit (INDEPENDENT_AMBULATORY_CARE_PROVIDER_SITE_OTHER): Payer: 59 | Admitting: Cardiology

## 2015-04-13 ENCOUNTER — Encounter: Payer: Self-pay | Admitting: Cardiology

## 2015-04-13 VITALS — BP 100/50 | HR 70 | Ht 70.0 in | Wt 279.3 lb

## 2015-04-13 DIAGNOSIS — Z950 Presence of cardiac pacemaker: Secondary | ICD-10-CM | POA: Diagnosis not present

## 2015-04-13 DIAGNOSIS — I10 Essential (primary) hypertension: Secondary | ICD-10-CM | POA: Diagnosis not present

## 2015-04-13 DIAGNOSIS — I5042 Chronic combined systolic (congestive) and diastolic (congestive) heart failure: Secondary | ICD-10-CM | POA: Diagnosis not present

## 2015-04-13 DIAGNOSIS — I429 Cardiomyopathy, unspecified: Secondary | ICD-10-CM

## 2015-04-13 NOTE — Assessment & Plan Note (Signed)
Patient appears to be euvolemic. Continue present dose of diuretics. We discussed sodium restriction and fluid restriction. He will take an additional 20 mg of Lasix for weight gain of 2 pounds.

## 2015-04-13 NOTE — Patient Instructions (Signed)
Your physician recommends that you schedule a follow-up appointment in: 3 MONTHS WITH DR CRENSHAW  If you need a refill on your cardiac medications before your next appointment, please call your pharmacy.   

## 2015-04-13 NOTE — Assessment & Plan Note (Signed)
Followed by electrophysiology. 

## 2015-04-13 NOTE — Assessment & Plan Note (Signed)
Blood pressure controlled. Continue present medications. 

## 2015-04-13 NOTE — Assessment & Plan Note (Signed)
Continue ARB and beta blocker. 

## 2015-05-06 ENCOUNTER — Other Ambulatory Visit (HOSPITAL_BASED_OUTPATIENT_CLINIC_OR_DEPARTMENT_OTHER): Payer: 59

## 2015-05-06 ENCOUNTER — Ambulatory Visit (HOSPITAL_BASED_OUTPATIENT_CLINIC_OR_DEPARTMENT_OTHER): Payer: 59 | Admitting: Hematology & Oncology

## 2015-05-06 ENCOUNTER — Encounter: Payer: Self-pay | Admitting: Hematology & Oncology

## 2015-05-06 ENCOUNTER — Ambulatory Visit (HOSPITAL_BASED_OUTPATIENT_CLINIC_OR_DEPARTMENT_OTHER): Payer: 59

## 2015-05-06 VITALS — BP 129/68 | HR 78 | Temp 97.5°F | Resp 18 | Ht 70.0 in | Wt 283.0 lb

## 2015-05-06 DIAGNOSIS — G4701 Insomnia due to medical condition: Secondary | ICD-10-CM

## 2015-05-06 DIAGNOSIS — D45 Polycythemia vera: Secondary | ICD-10-CM

## 2015-05-06 LAB — CBC WITH DIFFERENTIAL (CANCER CENTER ONLY)
BASO#: 0.3 10*3/uL — ABNORMAL HIGH (ref 0.0–0.2)
BASO%: 2.7 % — ABNORMAL HIGH (ref 0.0–2.0)
EOS%: 2.9 % (ref 0.0–7.0)
Eosinophils Absolute: 0.3 10*3/uL (ref 0.0–0.5)
HCT: 49.4 % (ref 38.7–49.9)
HGB: 15.4 g/dL (ref 13.0–17.1)
LYMPH#: 1.9 10*3/uL (ref 0.9–3.3)
LYMPH%: 19.1 % (ref 14.0–48.0)
MCH: 25.6 pg — ABNORMAL LOW (ref 28.0–33.4)
MCHC: 31.2 g/dL — ABNORMAL LOW (ref 32.0–35.9)
MCV: 82 fL (ref 82–98)
MONO#: 1 10*3/uL — ABNORMAL HIGH (ref 0.1–0.9)
MONO%: 9.6 % (ref 0.0–13.0)
NEUT#: 6.5 10*3/uL (ref 1.5–6.5)
NEUT%: 65.7 % (ref 40.0–80.0)
Platelets: 173 10*3/uL (ref 145–400)
RBC: 6.01 10*6/uL — ABNORMAL HIGH (ref 4.20–5.70)
RDW: 29.4 % — ABNORMAL HIGH (ref 11.1–15.7)
WBC: 9.9 10*3/uL (ref 4.0–10.0)

## 2015-05-06 LAB — COMPREHENSIVE METABOLIC PANEL
ALT: 22 U/L (ref 0–55)
AST: 21 U/L (ref 5–34)
Albumin: 4 g/dL (ref 3.5–5.0)
Alkaline Phosphatase: 102 U/L (ref 40–150)
Anion Gap: 9 mEq/L (ref 3–11)
BUN: 26.1 mg/dL — ABNORMAL HIGH (ref 7.0–26.0)
CO2: 25 mEq/L (ref 22–29)
Calcium: 9.2 mg/dL (ref 8.4–10.4)
Chloride: 107 mEq/L (ref 98–109)
Creatinine: 1.6 mg/dL — ABNORMAL HIGH (ref 0.7–1.3)
EGFR: 44 mL/min/{1.73_m2} — ABNORMAL LOW (ref >90)
Glucose: 148 mg/dl — ABNORMAL HIGH (ref 70–140)
Potassium: 4.8 mEq/L (ref 3.5–5.1)
Sodium: 142 mEq/L (ref 136–145)
Total Bilirubin: 0.84 mg/dL (ref 0.20–1.20)
Total Protein: 7.8 g/dL (ref 6.4–8.3)

## 2015-05-06 LAB — IRON AND TIBC
%SAT: 9 % — ABNORMAL LOW (ref 20–55)
Iron: 36 ug/dL — ABNORMAL LOW (ref 42–163)
TIBC: 423 ug/dL — ABNORMAL HIGH (ref 202–409)
UIBC: 386 ug/dL — ABNORMAL HIGH (ref 117–376)

## 2015-05-06 LAB — RETICULOCYTES
ABS Retic: 87.2 10*3/uL (ref 19.0–186.0)
RBC.: 5.81 MIL/uL (ref 4.22–5.81)
Retic Ct Pct: 1.5 % (ref 0.4–2.3)

## 2015-05-06 LAB — CHCC SATELLITE - SMEAR

## 2015-05-06 LAB — FERRITIN: Ferritin: 16 ng/ml — ABNORMAL LOW (ref 22–316)

## 2015-05-06 NOTE — Progress Notes (Signed)
Hematology and Oncology Follow Up Visit  William Villanueva [id number] 07-04-1950 64 y.o. 05/06/2015   Principle Diagnosis:   Polycythemia vera-JAK2 positive  Heart block-Mobitz II  Current Therapy:    Hydrea 5102m po q day  Aspirin 81 mg by mouth daily  Phlebotomy to maintain hematocrit below 45%     Interim History:  Mr.  BSuchyis back for followup. He is doing okay. He has had no cardiac issues. He does have some issues with his hands. It sounds like he may have carpal tunnel.  He's had no leg swelling. He does take Lasix to help with fluid retention.   His appetite has been okay. He's had no problems over the holidays so far.  His back continues to give him some issues.  Overall, his performance status is ECOG 1.   Medications:  Current outpatient prescriptions:  .  aspirin 81 MG tablet, Take 81 mg by mouth daily., Disp: , Rfl:  .  carvedilol (COREG) 25 MG tablet, Take 1 tablet (25 mg total) by mouth 2 (two) times daily with a meal., Disp: 60 tablet, Rfl: 10 .  citalopram (CELEXA) 10 MG tablet, Take 10 mg by mouth daily., Disp: , Rfl: 1 .  dicyclomine (BENTYL) 20 MG tablet, Take 20 mg by mouth 2 (two) times daily as needed (for stomach spasms). , Disp: , Rfl:  .  furosemide (LASIX) 40 MG tablet, Take 0.5 tablets (20 mg total) by mouth daily., Disp: 90 tablet, Rfl: 3 .  hydroxyurea (HYDREA) 500 MG capsule, Take 500 mg by mouth daily. May take with food to minimize GI side effects., Disp: , Rfl:  .  ibuprofen (ADVIL,MOTRIN) 200 MG tablet, Take 400 mg by mouth every 6 (six) hours as needed (for pain)., Disp: , Rfl:  .  irbesartan (AVAPRO) 150 MG tablet, Take 150 mg by mouth daily. , Disp: , Rfl:  .  isosorbide mononitrate (IMDUR) 30 MG 24 hr tablet, Take 1 tablet by mouth daily., Disp: , Rfl:  .  omeprazole (PRILOSEC) 20 MG capsule, Take 1 capsule by mouth daily., Disp: , Rfl:  .  polyvinyl alcohol (LIQUIFILM TEARS) 1.4 % ophthalmic solution, Place 1-2 drops into both eyes  as needed for dry eyes., Disp: , Rfl:  .  silodosin (RAPAFLO) 8 MG CAPS capsule, Take 8 mg by mouth daily with breakfast. , Disp: , Rfl:  .  spironolactone (ALDACTONE) 25 MG tablet, Take 0.5 tablets (12.5 mg total) by mouth daily., Disp: 45 tablet, Rfl: 3 .  temazepam (RESTORIL) 22.5 MG capsule, TAKE 1 CAPSULE BY MOUTH AT BEDTIME AS NEEDED FOR SLEEP, Disp: 30 capsule, Rfl: 0 .  ULORIC 40 MG tablet, TAKE 1 TABLET BY MOUTH DAILY., Disp: 30 tablet, Rfl: 6 No current facility-administered medications for this visit.  Facility-Administered Medications Ordered in Other Visits:  .  0.9 %  sodium chloride infusion, , Intravenous, Continuous, PVolanda Napoleon MD, Stopped at 05/16/13 1115  Allergies:  Allergies  Allergen Reactions  . Prednisone Other (See Comments)    Abdominal pain  . Wellbutrin [Bupropion] Hives    Past Medical History, Surgical history, Social history, and Family History were reviewed and updated.  Review of Systems: As above  Physical Exam:  height is 5' 10"  (1.778 m) and weight is 283 lb (128.368 kg). His oral temperature is 97.5 F (36.4 C). His blood pressure is 129/68 and his pulse is 78. His respiration is 18.   Obese white male in no obvious distress. There is no adenopathy  in the neck. Lungs are clear. Cardiac exam regular rate and rhythm. He has an occasional extra beat. He has no murmurs, rubs or bruits. Abdomen is obese but soft. Has good bowel sounds. There is no fluid wave. There is a palpable liver or spleen tip. Back exam shows a well healed laminectomy scar in the lumbar spine. Extremities shows no clubbing cyanosis or edema. He has good range of motion of his joints. He has good strength. Skin exam no rashes, ecchymoses or petechia. Neurological exam is nonfocal.  Lab Results  Component Value Date   WBC 9.9 05/06/2015   HGB 15.4 05/06/2015   HCT 49.4 05/06/2015   MCV 82 05/06/2015   PLT 173 05/06/2015     Chemistry      Component Value Date/Time   NA  140 04/07/2015 1327   NA 139 03/05/2015 0825   NA 135 10/29/2014 0849   K 4.0 04/07/2015 1327   K 5.0 03/05/2015 0825   K 3.8 10/29/2014 0849   CL 105 03/05/2015 0825   CL 103 10/29/2014 0849   CO2 27 04/07/2015 1327   CO2 24 03/05/2015 0825   CO2 24 10/29/2014 0849   BUN 20.2 04/07/2015 1327   BUN 20 03/05/2015 0825   BUN 12 10/29/2014 0849   CREATININE 1.2 04/07/2015 1327   CREATININE 1.29* 03/05/2015 0825   CREATININE 1.47* 01/29/2015 0829      Component Value Date/Time   CALCIUM 9.0 04/07/2015 1327   CALCIUM 8.9 03/05/2015 0825   CALCIUM 8.6 10/29/2014 0849   ALKPHOS 85 04/07/2015 1327   ALKPHOS 90 01/29/2015 0829   ALKPHOS 80 10/29/2014 0849   AST 26 04/07/2015 1327   AST 25 01/29/2015 0829   AST 24 10/29/2014 0849   ALT 22 04/07/2015 1327   ALT 23 01/29/2015 0829   ALT 18 10/29/2014 0849   BILITOT 1.00 04/07/2015 1327   BILITOT 0.6 01/29/2015 0829   BILITOT 1.00 10/29/2014 0849         Impression and Plan: William Villanueva is 64 year old gentleman with polycythemia vera. He does need a phlebotomy today. It has been several months since he had a phlebotomy. I think the last him that we phlebotomizing was probably back in May 2015.  I will continue him on the Hydrea. I think the 500 mg daily dose is working well.  I spent about 30 minutes with he and his wife today. They brought their granddaughter with them. She is quite talkative.   I will plan to see him back in about 6 weeks.Volanda Napoleon, MD 12/29/201610:34 AM

## 2015-05-06 NOTE — Patient Instructions (Signed)
Therapeutic Phlebotomy, Care After  Refer to this sheet in the next few weeks. These instructions provide you with information about caring for yourself after your procedure. Your health care provider may also give you more specific instructions. Your treatment has been planned according to current medical practices, but problems sometimes occur. Call your health care provider if you have any problems or questions after your procedure.  WHAT TO EXPECT AFTER THE PROCEDURE  After your procedure, it is common to have:   Light-headedness or dizziness. You may feel faint.   Nausea.   Tiredness.  HOME CARE INSTRUCTIONS  Activities   Return to your normal activities as directed by your health care provider. Most people can go back to their normal activities right away.   Avoid strenuous physical activity and heavy lifting or pulling for about 5 hours after the procedure. Do not lift anything that is heavier than 10 lb (4.5 kg).   Athletes should avoid strenuous exercise for at least 12 hours.   Change positions slowly for the remainder of the day. This will help to prevent light-headedness or fainting.   If you feel light-headed, lie down until the feeling goes away.  Eating and Drinking   Be sure to eat well-balanced meals for the next 24 hours.   Drink enough fluid to keep your urine clear or pale yellow.   Avoid drinking alcohol on the day that you had the procedure.  Care of the Needle Insertion Site   Keep your bandage dry. You can remove the bandage after about 5 hours or as directed by your health care provider.   If you have bleeding from the needle insertion site, elevate your arm and press firmly on the site until the bleeding stops.   If you have bruising at the site, apply ice to the area:   Put ice in a plastic bag.   Place a towel between your skin and the bag.   Leave the ice on for 20 minutes, 2-3 times a day for the first 24 hours.   If the swelling does not go away after 24 hours, apply  a warm, moist washcloth to the area for 20 minutes, 2-3 times a day.  General Instructions   Avoid smoking for at least 30 minutes after the procedure.   Keep all follow-up visits as directed by your health care provider. It is important to continue with further therapeutic phlebotomy treatments as directed.  SEEK MEDICAL CARE IF:   You have redness, swelling, or pain at the needle insertion site.   You have fluid, blood, or pus coming from the needle insertion site.   You feel light-headed, dizzy, or nauseated, and the feeling does not go away.   You notice new bruising at the needle insertion site.   You feel weaker than normal.   You have a fever or chills.  SEEK IMMEDIATE MEDICAL CARE IF:   You have severe nausea or vomiting.   You have chest pain.   You have trouble breathing.    This information is not intended to replace advice given to you by your health care provider. Make sure you discuss any questions you have with your health care provider.    Document Released: 09/26/2010 Document Revised: 09/08/2014 Document Reviewed: 04/20/2014  Elsevier Interactive Patient Education 2016 [employer]

## 2015-05-06 NOTE — Progress Notes (Signed)
William Villanueva presents today for phlebotomy per MD orders. Phlebotomy procedure started at 1044 and ended at 64. Approximately 500 mls removed. Patient observed for 30 minutes after procedure without any incident. Patient tolerated procedure well. IV needle removed intact.

## 2015-05-07 ENCOUNTER — Encounter: Payer: Self-pay | Admitting: *Deleted

## 2015-05-13 MED FILL — TEMAZEPAM 22.5 MG CAPSULE: 22.5 | 30 days supply | Qty: 30 | Fill #0

## 2015-05-13 MED FILL — IRBESARTAN 150 MG TABLET: 150 | 30 days supply | Qty: 30 | Fill #1

## 2015-05-13 MED FILL — CARVEDILOL 25 MG TABLET: 25 | 30 days supply | Qty: 60 | Fill #4

## 2015-05-13 MED FILL — OMEPRAZOLE DR 20 MG CAPSULE: 20 | 30 days supply | Qty: 60 | Fill #6

## 2015-05-13 MED FILL — ISOSORBIDE MN ER 30 MG TAB: 30 | 30 days supply | Qty: 30 | Fill #4

## 2015-05-13 MED FILL — HYDROXYUREA 500 MG CAPSULE: 500 | 30 days supply | Qty: 90 | Fill #2

## 2015-06-02 MED FILL — SPIRONOLACTONE 25 MG TABLET: 25 | 90 days supply | Qty: 45 | Fill #1

## 2015-06-02 MED FILL — ULORIC 40 MG TABLET: 40 | 30 days supply | Qty: 30 | Fill #5

## 2015-06-02 MED FILL — CITALOPRAM HBR 10 MG TABLET: 10 | 30 days supply | Qty: 30 | Fill #2

## 2015-06-10 NOTE — Progress Notes (Signed)
This encounter was created in error - please disregard.

## 2015-06-17 ENCOUNTER — Other Ambulatory Visit: Payer: Self-pay | Admitting: *Deleted

## 2015-06-17 DIAGNOSIS — D45 Polycythemia vera: Secondary | ICD-10-CM

## 2015-06-17 DIAGNOSIS — G4701 Insomnia due to medical condition: Secondary | ICD-10-CM

## 2015-06-17 MED ORDER — TEMAZEPAM 22.5 MG PO CAPS: ORAL_CAPSULE | ORAL | Status: DC

## 2015-06-17 MED FILL — CARVEDILOL 25 MG TABLET: 25 | 30 days supply | Qty: 60 | Fill #5

## 2015-06-17 MED FILL — IRBESARTAN 150 MG TABLET: 150 | 30 days supply | Qty: 30 | Fill #2

## 2015-06-17 MED FILL — ISOSORBIDE MN ER 30 MG TAB: 30 | 30 days supply | Qty: 30 | Fill #5

## 2015-06-17 MED FILL — TEMAZEPAM 22.5 MG CAPSULE: 22.5 | 30 days supply | Qty: 30 | Fill #0

## 2015-06-28 ENCOUNTER — Telehealth: Payer: Self-pay | Admitting: Cardiology

## 2015-06-28 MED FILL — ULORIC 40 MG TABLET: 40 | 30 days supply | Qty: 30 | Fill #6

## 2015-06-28 MED FILL — CITALOPRAM HBR 10 MG TABLET: 10 | 30 days supply | Qty: 30 | Fill #3

## 2015-06-28 NOTE — Telephone Encounter (Signed)
William Villanueva

## 2015-06-28 NOTE — Telephone Encounter (Signed)
Left message for pt to call and schedule follow up appt.

## 2015-06-28 NOTE — Telephone Encounter (Signed)
Spoke with wife  Dizzy spells for a few months - gotten worse Weight gain  Taking lasix 40mg  + 20mg  daily - lost 3lbs since 2/19  ---- 290lbs (2/19) down to 287lbs (2/20) Has been taking prescribed dose of lasix 40mg  + prn 20mg  for the past week Was losing more weight previously but has tapered off per patient/wife  Gained 15lbs since November  Occasional shortness of breath, can't sleep well Denies swelling Arms go numb  Patient has appt March 14 with MD.  Durward Fortes will send him message for advice.  Wife/patient voiced understanding of plan.

## 2015-06-28 NOTE — Telephone Encounter (Signed)
Pt called in stating that lately the pt has been having a lot of dizzy spells and gaining a lot of weight. She says that she has increased is Furosemide dosage from 38m to 666mand he still seems to be gaining weight. She would like to speak to someone about this . Please f/u with her

## 2015-06-29 NOTE — Telephone Encounter (Signed)
Patient wife called back. Rollene Fare checked w/ me for recommendation. Advised for PA office visit this week. She will schedule.

## 2015-06-30 NOTE — Telephone Encounter (Signed)
Follow up scheduled

## 2015-07-05 ENCOUNTER — Other Ambulatory Visit: Payer: Self-pay | Admitting: *Deleted

## 2015-07-05 ENCOUNTER — Ambulatory Visit: Payer: Self-pay

## 2015-07-05 ENCOUNTER — Ambulatory Visit: Payer: 59 | Admitting: Physician Assistant

## 2015-07-05 ENCOUNTER — Ambulatory Visit (HOSPITAL_BASED_OUTPATIENT_CLINIC_OR_DEPARTMENT_OTHER): Payer: 59 | Admitting: Hematology & Oncology

## 2015-07-05 ENCOUNTER — Other Ambulatory Visit (HOSPITAL_BASED_OUTPATIENT_CLINIC_OR_DEPARTMENT_OTHER): Payer: 59

## 2015-07-05 ENCOUNTER — Encounter: Payer: Self-pay | Admitting: Hematology & Oncology

## 2015-07-05 VITALS — BP 109/57 | HR 75 | Temp 97.2°F | Resp 16 | Ht 70.0 in | Wt 297.0 lb

## 2015-07-05 DIAGNOSIS — D45 Polycythemia vera: Secondary | ICD-10-CM

## 2015-07-05 DIAGNOSIS — G4701 Insomnia due to medical condition: Secondary | ICD-10-CM

## 2015-07-05 LAB — CBC WITH DIFFERENTIAL (CANCER CENTER ONLY)
BASO#: 0.2 10*3/uL (ref 0.0–0.2)
BASO%: 2.1 % — ABNORMAL HIGH (ref 0.0–2.0)
EOS%: 3.7 % (ref 0.0–7.0)
Eosinophils Absolute: 0.4 10*3/uL (ref 0.0–0.5)
HCT: 44.5 % (ref 38.7–49.9)
HGB: 13.8 g/dL (ref 13.0–17.1)
LYMPH#: 1.7 10*3/uL (ref 0.9–3.3)
LYMPH%: 16.3 % (ref 14.0–48.0)
MCH: 27.5 pg — ABNORMAL LOW (ref 28.0–33.4)
MCHC: 31 g/dL — ABNORMAL LOW (ref 32.0–35.9)
MCV: 89 fL (ref 82–98)
MONO#: 0.7 10*3/uL (ref 0.1–0.9)
MONO%: 7 % (ref 0.0–13.0)
NEUT#: 7.4 10*3/uL — ABNORMAL HIGH (ref 1.5–6.5)
NEUT%: 70.9 % (ref 40.0–80.0)
Platelets: 409 10*3/uL — ABNORMAL HIGH (ref 145–400)
RBC: 5.02 10*6/uL (ref 4.20–5.70)
RDW: 19.9 % — ABNORMAL HIGH (ref 11.1–15.7)
WBC: 10.5 10*3/uL — ABNORMAL HIGH (ref 4.0–10.0)

## 2015-07-05 LAB — FERRITIN: Ferritin: 12 ng/ml — ABNORMAL LOW (ref 22–316)

## 2015-07-05 LAB — IRON AND TIBC
%SAT: 7 % — ABNORMAL LOW (ref 20–55)
Iron: 29 ug/dL — ABNORMAL LOW (ref 42–163)
TIBC: 410 ug/dL — ABNORMAL HIGH (ref 202–409)
UIBC: 381 ug/dL — ABNORMAL HIGH (ref 117–376)

## 2015-07-05 LAB — COMPREHENSIVE METABOLIC PANEL
ALT: 34 U/L (ref 0–55)
AST: 29 U/L (ref 5–34)
Albumin: 3.6 g/dL (ref 3.5–5.0)
Alkaline Phosphatase: 94 U/L (ref 40–150)
Anion Gap: 11 mEq/L (ref 3–11)
BUN: 18.3 mg/dL (ref 7.0–26.0)
CO2: 22 mEq/L (ref 22–29)
Calcium: 8.6 mg/dL (ref 8.4–10.4)
Chloride: 104 mEq/L (ref 98–109)
Creatinine: 1.5 mg/dL — ABNORMAL HIGH (ref 0.7–1.3)
EGFR: 48 mL/min/{1.73_m2} — ABNORMAL LOW (ref >90)
Glucose: 243 mg/dl — ABNORMAL HIGH (ref 70–140)
Potassium: 4.3 mEq/L (ref 3.5–5.1)
Sodium: 137 mEq/L (ref 136–145)
Total Bilirubin: 0.62 mg/dL (ref 0.20–1.20)
Total Protein: 7.1 g/dL (ref 6.4–8.3)

## 2015-07-05 MED ORDER — TRAZODONE HCL 100 MG PO TABS: 100.0000 mg | ORAL_TABLET | Freq: Every day | ORAL | Status: DC

## 2015-07-05 MED FILL — traZODone HCL 100 MG TABS: 100 | 30 days supply | Qty: 30 | Fill #0

## 2015-07-05 MED FILL — RAPAFLO 8 MG CAPSULE: 8 | 30 days supply | Qty: 30 | Fill #2

## 2015-07-05 NOTE — Progress Notes (Signed)
Phlebotomy not required based on Hct parameters. dph

## 2015-07-05 NOTE — Progress Notes (Signed)
Hematology and Oncology Follow Up Visit  William Villanueva [id number] [birth year] 65 y.o. 07/05/2015   Principle Diagnosis:   Polycythemia vera-JAK2 positive  Heart block-Mobitz II  Current Therapy:    Hydrea 578m po q day  Aspirin 81 mg by mouth daily  Phlebotomy to maintain hematocrit below 45%     Interim History:  [name] back for followup. He is doing okay. He has had no cardiac issues. He does have some issues with his hands. It sounds like he may have carpal tunnel.  He's had no leg swelling. He does take Lasix to help with fluid retention.   He has been having some dizziness. He chooses to his Restoril. He stopped taking Restoril. He is on 5 different blood pressure medications. I really don't think he has to be on 5 different blood pressure medications. I told him that he probably needs to talk to his cardiologist about this.  He is not sleeping well. I will try him on some trazodone at 100 mg by mouth daily at bedtime.  He is not exercising now. Because this, is gaining quite a bit of weight. His back continues to give him some issues.  Overall, his performance status is ECOG 1.   Medications:  Current outpatient prescriptions:  .  aspirin 81 MG tablet, Take 81 mg by mouth daily., Disp: , Rfl:  .  carvedilol (COREG) 25 MG tablet, Take 1 tablet (25 mg total) by mouth 2 (two) times daily with a meal., Disp: 60 tablet, Rfl: 10 .  citalopram (CELEXA) 10 MG tablet, Take 10 mg by mouth daily., Disp: , Rfl: 1 .  furosemide (LASIX) 40 MG tablet, Take 0.5 tablets (20 mg total) by mouth daily., Disp: 90 tablet, Rfl: 3 .  hydroxyurea (HYDREA) 500 MG capsule, Take 500 mg by mouth daily. May take with food to minimize GI side effects., Disp: , Rfl:  .  ibuprofen (ADVIL,MOTRIN) 200 MG tablet, Take 400 mg by mouth every 6 (six) hours as needed (for pain)., Disp: , Rfl:  .  irbesartan (AVAPRO) 150 MG tablet, Take 150 mg by mouth daily. , Disp: , Rfl:  .  isosorbide  mononitrate (IMDUR) 30 MG 24 hr tablet, Take 1 tablet by mouth daily., Disp: , Rfl:  .  omeprazole (PRILOSEC) 20 MG capsule, Take 1 capsule by mouth daily., Disp: , Rfl:  .  polyvinyl alcohol (LIQUIFILM TEARS) 1.4 % ophthalmic solution, Place 1-2 drops into both eyes as needed for dry eyes., Disp: , Rfl:  .  silodosin (RAPAFLO) 8 MG CAPS capsule, Take 8 mg by mouth daily with breakfast. , Disp: , Rfl:  .  spironolactone (ALDACTONE) 25 MG tablet, Take 0.5 tablets (12.5 mg total) by mouth daily., Disp: 45 tablet, Rfl: 3 .  ULORIC 40 MG tablet, TAKE 1 TABLET BY MOUTH DAILY., Disp: 30 tablet, Rfl: 6 .  traZODone (DESYREL) 100 MG tablet, Take 1 tablet (100 mg total) by mouth at bedtime., Disp: 30 tablet, Rfl: 3 No current facility-administered medications for this visit.  Facility-Administered Medications Ordered in Other Visits:  .  0.9 %  sodium chloride infusion, , Intravenous, Continuous, PVolanda Napoleon MD, Stopped at 05/16/13 1115  Allergies:  Allergies  Allergen Reactions  . Prednisone Other (See Comments)    Abdominal pain  . Wellbutrin [Bupropion] Hives    Past Medical History, Surgical history, Social history, and Family History were reviewed and updated.  Review of Systems: As above  Physical Exam:  height is 5'  10" (1.778 m) and weight is 297 lb (134.718 kg). His oral temperature is 97.2 F (36.2 C). His blood pressure is 109/57 and his pulse is 75. His respiration is 16.   Obese white male in no obvious distress. There is no adenopathy in the neck. Lungs are clear. Cardiac exam regular rate and rhythm. He has an occasional extra beat. He has no murmurs, rubs or bruits. Abdomen is obese but soft. Has good bowel sounds. There is no fluid wave. There is a palpable liver or spleen tip. Back exam shows a well healed laminectomy scar in the lumbar spine. Extremities shows no clubbing cyanosis or edema. He has good range of motion of his joints. He has good strength. Skin exam no  rashes, ecchymoses or petechia. Neurological exam is nonfocal.  Lab Results  Component Value Date   WBC 10.5* 07/05/2015   HGB 13.8 07/05/2015   HCT 44.5 07/05/2015   MCV 89 07/05/2015   PLT 409* 07/05/2015     Chemistry      Component Value Date/Time   NA 137 07/05/2015 0935   NA 139 03/05/2015 0825   NA 135 10/29/2014 0849   K 4.3 07/05/2015 0935   K 5.0 03/05/2015 0825   K 3.8 10/29/2014 0849   CL 105 03/05/2015 0825   CL 103 10/29/2014 0849   CO2 22 07/05/2015 0935   CO2 24 03/05/2015 0825   CO2 24 10/29/2014 0849   BUN 18.3 07/05/2015 0935   BUN 20 03/05/2015 0825   BUN 12 10/29/2014 0849   CREATININE 1.5* 07/05/2015 0935   CREATININE 1.29* 03/05/2015 0825   CREATININE 1.47* 01/29/2015 0829      Component Value Date/Time   CALCIUM 8.6 07/05/2015 0935   CALCIUM 8.9 03/05/2015 0825   CALCIUM 8.6 10/29/2014 0849   ALKPHOS 94 07/05/2015 0935   ALKPHOS 90 01/29/2015 0829   ALKPHOS 80 10/29/2014 0849   AST 29 07/05/2015 0935   AST 25 01/29/2015 0829   AST 24 10/29/2014 0849   ALT 34 07/05/2015 0935   ALT 23 01/29/2015 0829   ALT 18 10/29/2014 0849   BILITOT 0.62 07/05/2015 0935   BILITOT 0.6 01/29/2015 0829   BILITOT 1.00 10/29/2014 0849         Impression and Plan: William Villanueva is 65 year old gentleman with polycythemia vera. He does not need a phlebotomy today.   I will continue him on the Hydrea. I think the 500 mg daily dose is working well.  I spent about 30 minutes with he and his wife today.    I will plan to see him back in about 6 weeks.Volanda Napoleon, MD 2/27/20175:38 PM

## 2015-07-06 LAB — RETICULOCYTES: Reticulocyte Count: 1.9 % (ref 0.6–2.6)

## 2015-07-13 NOTE — Progress Notes (Signed)
HPI: FU cardiomyopathy/CHF. Echocardiogram in August of 2013 showed an ejection fraction of 35-40%. Cardiac catheterization in September of 2013 showed mild nonobstructive coronary disease. There was no hemodynamic evidence of restriction. Pulmonary capillary wedge pressure 10. Cardiac MRI in September of 2013 showed an ejection fraction of 34% with diffuse hypokinesis. There was no hyperenhancement or scar tissue and no evidence of cardiac hemochromatosis. TSH in September 2013 normal. 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 Patient subsequently had CRTP placed. Echo 2/16 showed EF 93-90, grade 1 diastolic dysfunction, moderate LAE. Nuclear study 2/16 showed EF 35, no ischemia. CTA 2/16 showed no PE. Since last seen, He has some dyspnea on exertion. No orthopnea, PND, pedal edema or syncope. Some dizziness at times. He has occasional chest pain. It is in the areas locations on the chest and can last all day. He saw Dr. Caryl Comes and a nuclear study was ordered.  Current Outpatient Prescriptions  Medication Sig Dispense Refill  . aspirin 81 MG tablet Take 81 mg by mouth daily.    . carvedilol (COREG) 12.5 MG tablet Take 1 tablet (12.5 mg total) by mouth 2 (two) times daily with a meal. 60 tablet 6  . citalopram (CELEXA) 10 MG tablet Take 10 mg by mouth daily.  1  . furosemide (LASIX) 40 MG tablet Take 0.5 tablets (20 mg total) by mouth daily. 90 tablet 3  . hydroxyurea (HYDREA) 500 MG capsule Take 500 mg by mouth daily. May take with food to minimize GI side effects.    [name] ibuprofen (ADVIL,MOTRIN) 200 MG tablet Take 400 mg by mouth every 6 (six) hours as needed (for pain).    . irbesartan (AVAPRO) 75 MG tablet Take 1 tablet (75 mg total) by mouth daily. 30 tablet 6  . isosorbide mononitrate (IMDUR) 30 MG 24 hr tablet Take 1 tablet by mouth daily.    [name] omeprazole (PRILOSEC) 20 MG capsule Take 1 capsule by mouth daily.    . polyvinyl alcohol (LIQUIFILM TEARS) 1.4 % ophthalmic  solution Place 1-2 drops into both eyes as needed for dry eyes.    . silodosin (RAPAFLO) 8 MG CAPS capsule Take 8 mg by mouth daily with breakfast.     . spironolactone (ALDACTONE) 25 MG tablet Take 0.5 tablets (12.5 mg total) by mouth daily. 45 tablet 3  . ULORIC 40 MG tablet TAKE 1 TABLET BY MOUTH DAILY. 30 tablet 6   No current facility-administered medications for this visit.   Facility-Administered Medications Ordered in Other Visits  Medication Dose Route Frequency Provider Last Rate Last Dose  . 0.9 %  sodium chloride infusion   Intravenous Continuous Volanda Napoleon, MD   Stopped at 05/16/13 1115     Past Medical History  Diagnosis Date  . Polycythemia vera(238.4)   . Back pain   . Hemochromatosis   . Gout   . Hypertension   . Nephrolithiasis   . Cardiomyopathy ([place])     Nonischemic 45%.   [name] Hypospadias Jan 18, 1951    born with  . CHF (congestive heart failure) ([place])   . AV block, 2nd degree 2015    [employer] Allure Quadra pulse generator [id number], model PM [id number]    Past Surgical History  Procedure Laterality Date  . Ankle surgery    . Pilonidal cyst excision    . Tonsillectomy    . Bi-ventricular pacemaker insertion N/A 04/29/2014    Procedure: BI-VENTRICULAR PACEMAKER INSERTION (CRT-P);  Surgeon: Deboraha Sprang,  MD; Laterality: Left  [employer] Jude Allure Quadra pulse generator [id number], model PM [id number]    Social History   Social History  . Marital Status: Married    Spouse Name: N/A  . Number of Children: N/A  . Years of Education: N/A   Occupational History  . Not on file.   Social History Main Topics  . Smoking status: Former Smoker -- 1.00 packs/day for 44 years    Types: Cigarettes    Start date: 06/05/1968    Quit date: 04/05/2013  . Smokeless tobacco: Never Used     Comment: quit 2 years ago  . Alcohol Use: 0.0 oz/week    0 Standard drinks or equivalent per week     Comment: Occasional  . Drug Use: No  . Sexual Activity: Not on file   Other Topics  Concern  . Not on file   Social History Narrative   Lives at home with wife.     Family History  Problem Relation Age of Onset  . Heart disease Maternal Grandmother     Pacemaker, [place]  . Stroke Mother     ROS: Back pain but no fevers or chills, productive cough, hemoptysis, dysphasia, odynophagia, melena, hematochezia, dysuria, hematuria, rash, seizure activity, orthopnea, PND, pedal edema, claudication. Remaining systems are negative.  Physical Exam: Well-developed obese in no acute distress.  Skin is warm and dry.  HEENT is normal.  Neck is supple.  Chest is clear to auscultation with normal expansion.  Cardiovascular exam is regular rate and rhythm.  Abdominal exam nontender or distended. No masses palpated. Extremities show no edema. neuro grossly intact

## 2015-07-14 ENCOUNTER — Other Ambulatory Visit: Payer: Self-pay | Admitting: Neurosurgery

## 2015-07-14 DIAGNOSIS — M5416 Radiculopathy, lumbar region: Secondary | ICD-10-CM

## 2015-07-15 ENCOUNTER — Other Ambulatory Visit: Payer: Self-pay | Admitting: Internal Medicine

## 2015-07-15 ENCOUNTER — Encounter: Payer: Self-pay | Admitting: Internal Medicine

## 2015-07-15 ENCOUNTER — Ambulatory Visit (INDEPENDENT_AMBULATORY_CARE_PROVIDER_SITE_OTHER): Payer: 59 | Admitting: Internal Medicine

## 2015-07-15 VITALS — BP 122/72 | HR 78 | Ht 70.0 in | Wt 297.0 lb

## 2015-07-15 DIAGNOSIS — R079 Chest pain, unspecified: Secondary | ICD-10-CM

## 2015-07-15 DIAGNOSIS — I429 Cardiomyopathy, unspecified: Secondary | ICD-10-CM | POA: Diagnosis not present

## 2015-07-15 DIAGNOSIS — I442 Atrioventricular block, complete: Secondary | ICD-10-CM | POA: Diagnosis not present

## 2015-07-15 DIAGNOSIS — Z79899 Other long term (current) drug therapy: Secondary | ICD-10-CM | POA: Diagnosis not present

## 2015-07-15 DIAGNOSIS — I428 Other cardiomyopathies: Secondary | ICD-10-CM

## 2015-07-15 DIAGNOSIS — Z950 Presence of cardiac pacemaker: Secondary | ICD-10-CM

## 2015-07-15 LAB — TSH: TSH: 0.5 mIU/L (ref 0.40–4.50)

## 2015-07-15 MED ORDER — IRBESARTAN 75 MG PO TABS: 75.0000 mg | ORAL_TABLET | Freq: Every day | ORAL | Status: DC

## 2015-07-15 MED ORDER — CARVEDILOL 12.5 MG PO TABS: 12.5000 mg | ORAL_TABLET | Freq: Two times a day (BID) | ORAL | Status: DC

## 2015-07-15 MED FILL — CARVEDILOL 12.5 MG TABLET: 12.5 | 30 days supply | Qty: 60 | Fill #0

## 2015-07-15 NOTE — Progress Notes (Signed)
Electrophysiology Office Note   Date:  07/15/2015   ID:  William Villanueva, DOB 03-26-1951, MRN [id number]  PCP:  Gara Kroner, MD  Cardiologist:  [facility] Primary Electrophysiologist:    [name], MD    No chief complaint on file.    History of Present Illness: William Villanueva is a 65 y.o. male is   seen in follow-up for CRT-P implantation for nonischemic cardiomyopathy. He had presented to 12/15 with symptomatic high-grade heart block. Echo reevaluation 2/16 demonstrated near normalization of LV systolic function; he been readmitted at that time because of pain which dated back to his device implantation. Nuclear medicine 2/16 had an EF of 35%  .  She's had a history of recurrent problems with chest pain following device implantation. Of more significant note over the last 3 or 4 days he has had left and right parasternal discomfort that is aggravated by exertion and relieved by rest.  He has also been having problems with progressive shortness of breath over recent months.  His weight is about 30 pounds in the last 5 months. 265--297  this is been associated with progressive fatigue.    Past Medical History  Diagnosis Date  . Polycythemia vera(238.4)   . Back pain   . Hemochromatosis   . Gout   . Hypertension   . Nephrolithiasis   . Cardiomyopathy ([place])     Nonischemic 45%.   [name] Hypospadias Jan 31, 1951    born with  . CHF (congestive heart failure) ([place])   . AV block, 2nd degree 2015    St. Jude Allure Quadra pulse generator [id number], model PM [id number]   Past Surgical History  Procedure Laterality Date  . Ankle surgery    . Pilonidal cyst excision    . Tonsillectomy    . Bi-ventricular pacemaker insertion N/A 04/29/2014    Procedure: BI-VENTRICULAR PACEMAKER INSERTION (CRT-P);  Surgeon: Deboraha Sprang, MD; Laterality: Left  St. Jude Allure Quadra pulse generator [id number], model PM [id number]     Current Outpatient Prescriptions  Medication Sig Dispense Refill  . aspirin 81 MG  tablet Take 81 mg by mouth daily.    . carvedilol (COREG) 25 MG tablet Take 1 tablet (25 mg total) by mouth 2 (two) times daily with a meal. 60 tablet 10  . citalopram (CELEXA) 10 MG tablet Take 10 mg by mouth daily.  1  . furosemide (LASIX) 40 MG tablet Take 0.5 tablets (20 mg total) by mouth daily. 90 tablet 3  . hydroxyurea (HYDREA) 500 MG capsule Take 500 mg by mouth daily. May take with food to minimize GI side effects.    [name] ibuprofen (ADVIL,MOTRIN) 200 MG tablet Take 400 mg by mouth every 6 (six) hours as needed (for pain).    . irbesartan (AVAPRO) 150 MG tablet Take 150 mg by mouth daily.     . isosorbide mononitrate (IMDUR) 30 MG 24 hr tablet Take 1 tablet by mouth daily.    [name] omeprazole (PRILOSEC) 20 MG capsule Take 1 capsule by mouth daily.    . polyvinyl alcohol (LIQUIFILM TEARS) 1.4 % ophthalmic solution Place 1-2 drops into both eyes as needed for dry eyes.    . silodosin (RAPAFLO) 8 MG CAPS capsule Take 8 mg by mouth daily with breakfast.     . spironolactone (ALDACTONE) 25 MG tablet Take 0.5 tablets (12.5 mg total) by mouth daily. 45 tablet 3  . ULORIC 40 MG tablet TAKE 1 TABLET BY MOUTH DAILY. 30 tablet 6   No current  facility-administered medications for this visit.   Facility-Administered Medications Ordered in Other Visits  Medication Dose Route Frequency Provider Last Rate Last Dose  . 0.9 %  sodium chloride infusion   Intravenous Continuous Volanda Napoleon, MD   Stopped at 05/16/13 1115    Allergies:   Temazepam; Trazodone and nefazodone; Ibuprofen; Prednisone; and Wellbutrin   Social History:  The patient  reports that he quit smoking about 2 years ago. His smoking use included Cigarettes. He started smoking about 47 years ago. He has a 44 pack-year smoking history. He has never used smokeless tobacco. He reports that he drinks alcohol. He reports that he does not use illicit drugs.   Family History:  The patient's    family history includes Heart disease in his  maternal grandmother; Stroke in his mother.    ROS:  Please see the history of present illness and past medical history  Otherwise, all other systems were reviewed and were negative except .     PHYSICAL EXAM: VS:  BP 122/72 mmHg  Pulse 78  Ht 5' 10"  (1.778 m)  Wt 297 lb (134.718 kg)  BMI 42.61 kg/m2 , BMI Body mass index is 42.61 kg/(m^2). GEN: Well nourished, well developed  Morbidly obese , in no acute distress HEENT: normal Neck:  JVD flat, carotid bruits, or masses Cardiac: REGULAR RATE and RHYTHM ;  No murmurs, rubs, + S4  Back without kyphosis; No CVAT Respiratory:  clear to auscultation bilaterally, normal work of breathing GI: soft, nontender, nondistended, + BS MS: no deformity or atrophy Extremities no clubbing cyanosis  edema Skin: warm and dry,  device pocket is well healed without teathering Neuro:  Strength and sensation are intact Psych: euthymic mood, full affect  EKG:  EKG is ordered today Sinus rhythm at 78 with P synchronous pacing the QRS morphology is upright in V1 but also possibly 1  Device interrogation is reviewed today in detail.  See PaceArt for details.   Recent Labs: 01/19/2015: B Natriuretic Peptide 1222.8* 07/05/2015: ALT 34; BUN 18.3; Creatinine 1.5*; HGB 13.8; Platelets 409*; Potassium 4.3; Sodium 137    Lipid Panel     Component Value Date/Time   CHOL 126 04/29/2014 0139   TRIG 146 04/29/2014 0139   HDL 29* 04/29/2014 0139   CHOLHDL 4.3 04/29/2014 0139   VLDL 29 04/29/2014 0139   LDLCALC 68 04/29/2014 0139     Wt Readings from Last 3 Encounters:  07/15/15 297 lb (134.718 kg)  07/05/15 297 lb (134.718 kg)  05/06/15 283 lb (128.368 kg)      Other studies Reviewed: Additional studies/ records that were reviewed today include Dr Morrison Old notes describing the discontinuation of ACE and substitiution with ARB  ASSESSMENT AND PLAN:  NICM  Resolved cardiomyopathy  Hypertension  Pacemaker lead issue There evidence of atrial  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 There is no evidence of atrial fibrillation  Pacemaker-CRT-St. Jude The patient's device was interrogated.  The information was reviewed. Changes were made to address the above  Significant weight gain  Orthostatic lightheadedness  The patient has had a significant interval weight gain. I do not see volume overload on examination. His last thyroid assessment was a couple of years ago. We will check a TSH and Dr. London Sheer to review it when he sees him next week.  He has symptomatic orthostatic lightheadedness and demonstrated orthostatic hypotension. This is been ameliorated some by adjustments in medications. I've taken the liberty of further reducing his carvedilol  and irbesartan.  It occurs to me in this context that we might want to check a cortisol level which I failed to do. Perhaps it can be added to his TSH.  His chest pain syndrome is unusual. He has a nonischemic heart disease by past evaluation. I have scheduled a Myoview scan although it may not get done before he sees Dr. London Sheer Next week.  I am also perplexed as to the variability of assessment of LV function apart. I told him that Dr. London Sheer can clarify is far better than I can at the [place] scan in the echocardiogram.   Current medicines are reviewed at length with the patient today.   The patient has concerns regarding his medicines As above  Labs/ tests ordered today include:   TSH   Disposition:   FU with AS 9 month(s)  Signed, [name], MD  07/15/2015 4:07 PM     [address] [place] [address] [phone] (office) [phone] (fax)

## 2015-07-15 NOTE — Patient Instructions (Addendum)
Medication Instructions:  Your physician has recommended you make the following change in your medication:  1) DECREASE Carvedilol to 12.5 mg twice a day 2) DECREASE Irbesartan to 75 mg daily  Labwork: TSH today  Testing/Procedures: Your physician has requested that you have a lexiscan myoview. For further information please visit [email or web address]. Please follow instruction sheet, as given.  Follow-Up: Your physician wants you to follow-up in: 6 months with Dr. Caryl Comes. You will receive a reminder letter in the mail two months in advance. If you don't receive a letter, please call our office to schedule the follow-up appointment.   If you need a refill on your cardiac medications before your next appointment, please call your pharmacy.  Thank you for choosing [facility]!!

## 2015-07-16 LAB — CUP PACEART INCLINIC DEVICE CHECK
Battery Remaining Longevity: 88.8
Battery Voltage: 2.98 V
Brady Statistic RA Percent Paced: 4.5 %
Brady Statistic RV Percent Paced: 98 %
Date Time Interrogation Session: 20170309205258
Implantable Lead Implant Date: 20151223
Implantable Lead Implant Date: 20151223
Implantable Lead Implant Date: 20151223
Implantable Lead Location: 753858
Implantable Lead Location: 753859
Implantable Lead Location: 753860
Lead Channel Impedance Value: 425 Ohm
Lead Channel Impedance Value: 437.5 Ohm
Lead Channel Impedance Value: 787.5 Ohm
Lead Channel Pacing Threshold Amplitude: 0.75 V
Lead Channel Pacing Threshold Amplitude: 0.75 V
Lead Channel Pacing Threshold Amplitude: 1.25 V
Lead Channel Pacing Threshold Pulse Width: 0.4 ms
Lead Channel Pacing Threshold Pulse Width: 0.4 ms
Lead Channel Pacing Threshold Pulse Width: 0.7 ms
Lead Channel Sensing Intrinsic Amplitude: 5 mV
Lead Channel Setting Pacing Amplitude: 1.75 V
Lead Channel Setting Pacing Amplitude: 2 V
Lead Channel Setting Pacing Amplitude: 2.25 V
Lead Channel Setting Pacing Pulse Width: 0.4 ms
Lead Channel Setting Pacing Pulse Width: 0.7 ms
Lead Channel Setting Sensing Sensitivity: 2 mV
Pulse Gen Model: 3242
Pulse Gen Serial Number: 7701275

## 2015-07-16 LAB — CORTISOL: Cortisol, Plasma: 12.2 ug/dL

## 2015-07-16 MED FILL — IRBESARTAN 75 MG TABLET: 75 | 30 days supply | Qty: 30 | Fill #0

## 2015-07-16 NOTE — Progress Notes (Signed)
Spoke w/ [facility] labs this morning and added Cortisol level to lab draw from yesterday, per Caryl Comes.

## 2015-07-20 ENCOUNTER — Encounter: Payer: Self-pay | Admitting: Cardiology

## 2015-07-20 ENCOUNTER — Ambulatory Visit (INDEPENDENT_AMBULATORY_CARE_PROVIDER_SITE_OTHER): Payer: 59 | Admitting: Cardiology

## 2015-07-20 VITALS — BP 134/76 | HR 84 | Ht 70.0 in | Wt 300.0 lb

## 2015-07-20 DIAGNOSIS — I429 Cardiomyopathy, unspecified: Secondary | ICD-10-CM | POA: Diagnosis not present

## 2015-07-20 DIAGNOSIS — Z950 Presence of cardiac pacemaker: Secondary | ICD-10-CM

## 2015-07-20 DIAGNOSIS — R0789 Other chest pain: Secondary | ICD-10-CM | POA: Diagnosis not present

## 2015-07-20 DIAGNOSIS — I10 Essential (primary) hypertension: Secondary | ICD-10-CM | POA: Diagnosis not present

## 2015-07-20 DIAGNOSIS — E669 Obesity, unspecified: Secondary | ICD-10-CM

## 2015-07-20 DIAGNOSIS — I5042 Chronic combined systolic (congestive) and diastolic (congestive) heart failure: Secondary | ICD-10-CM

## 2015-07-20 NOTE — Assessment & Plan Note (Signed)
Patient counseled on weight lossAnd exercise.

## 2015-07-20 NOTE — Assessment & Plan Note (Signed)
Followed by electrophysiology. 

## 2015-07-20 NOTE — Patient Instructions (Signed)
Medication Instructions:   STOP ISOSORBIDE  Follow-Up:  Your physician recommends that you schedule a follow-up appointment in: [place]

## 2015-07-20 NOTE — Assessment & Plan Note (Signed)
Patient appears to be euvolemic. Continue present dose of diuretics.

## 2015-07-20 NOTE — Assessment & Plan Note (Signed)
Symptoms are atypical. Nuclear study has been scheduled.

## 2015-07-20 NOTE — Assessment & Plan Note (Addendum)
Blood pressure controlled. Continue present medications. Given orthostatic symptoms I will discontinue isosorbideTo improve blood pressure.

## 2015-07-20 NOTE — Assessment & Plan Note (Signed)
Continue ARB and beta blocker. I will not advance as he has had problems with orthostasis and dizziness.

## 2015-07-21 ENCOUNTER — Telehealth (HOSPITAL_COMMUNITY): Payer: Self-pay | Admitting: *Deleted

## 2015-07-21 ENCOUNTER — Encounter: Payer: Self-pay | Admitting: Physician Assistant

## 2015-07-21 NOTE — Telephone Encounter (Signed)
Patient given detailed instructions per Myocardial Perfusion Study Information Sheet for the test on 07/26/15 Patient notified to arrive 15 minutes early and that it is imperative to arrive on time for appointment to keep from having the test rescheduled.  If you need to cancel or reschedule your appointment, please call the office within 24 hours of your appointment. Failure to do so may result in a cancellation of your appointment, and a $50 no show fee. Patient verbalized understanding. Hubbard Robinson, RN

## 2015-07-22 NOTE — Addendum Note (Signed)
Addended by: Freada Bergeron on: 07/22/2015 03:14 PM   Modules accepted: Orders

## 2015-07-23 ENCOUNTER — Ambulatory Visit
Admission: RE | Admit: 2015-07-23 | Discharge: 2015-07-23 | Disposition: A | Discharge status: Home / Self Care | Payer: Worker's Compensation | Source: Ambulatory Visit | Attending: Neurosurgery | Admitting: Neurosurgery

## 2015-07-23 DIAGNOSIS — M5416 Radiculopathy, lumbar region: Secondary | ICD-10-CM

## 2015-07-23 MED ORDER — IOHEXOL 180 MG/ML  SOLN
15.0000 mL | Freq: Once | INTRAMUSCULAR | Status: DC | PRN
Start: 2015-07-23 — End: 2015-07-24

## 2015-07-23 MED ORDER — DIAZEPAM 5 MG PO TABS
10.0000 mg | ORAL_TABLET | Freq: Once | ORAL | Status: AC
  Administered 2015-07-23: 10 mg via ORAL

## 2015-07-23 NOTE — Discharge Instructions (Signed)
Myelogram Discharge Instructions  1. Go home and rest quietly for the next 24 hours.  It is important to lie flat for the next 24 hours.  Get up only to go to the restroom.  You may lie in the bed or on a couch on your back, your stomach, your left side or your right side.  You may have one pillow under your head.  You may have pillows between your knees while you are on your side or under your knees while you are on your back.  2. DO NOT drive today.  Recline the seat as far back as it will go, while still wearing your seat belt, on the way home.  3. You may get up to go to the bathroom as needed.  You may sit up for 10 minutes to eat.  You may resume your normal diet and medications unless otherwise indicated.  Drink lots of extra fluids today and tomorrow.  4. The incidence of headache, nausea, or vomiting is about 5% (one in 20 patients).  If you develop a headache, lie flat and drink plenty of fluids until the headache goes away.  Caffeinated beverages may be helpful.  If you develop severe nausea and vomiting or a headache that does not go away with flat bed rest, call [phone].  5. You may resume normal activities after your 24 hours of bed rest is over; however, do not exert yourself strongly or do any heavy lifting tomorrow. If when you get up you have a headache when standing, go back to bed and force fluids for another 24 hours.  6. Call your physician for a follow-up appointment.  The results of your myelogram will be sent directly to your physician by the following day.  7. If you have any questions or if complications develop after you arrive home, please call [phone].  Discharge instructions have been explained to the patient.  The patient, or the person responsible for the patient, fully understands these instructions.       May resume Celexa on July 24, 2015, after 9:30 am.

## 2015-07-26 ENCOUNTER — Ambulatory Visit (HOSPITAL_COMMUNITY): Payer: 59 | Attending: Cardiology

## 2015-07-26 ENCOUNTER — Other Ambulatory Visit: Payer: Self-pay | Admitting: Hematology & Oncology

## 2015-07-26 DIAGNOSIS — I119 Hypertensive heart disease without heart failure: Secondary | ICD-10-CM | POA: Insufficient documentation

## 2015-07-26 DIAGNOSIS — R9439 Abnormal result of other cardiovascular function study: Secondary | ICD-10-CM | POA: Diagnosis not present

## 2015-07-26 DIAGNOSIS — R0609 Other forms of dyspnea: Secondary | ICD-10-CM | POA: Diagnosis not present

## 2015-07-26 DIAGNOSIS — R079 Chest pain, unspecified: Secondary | ICD-10-CM | POA: Insufficient documentation

## 2015-07-26 DIAGNOSIS — R42 Dizziness and giddiness: Secondary | ICD-10-CM | POA: Insufficient documentation

## 2015-07-26 MED ORDER — TECHNETIUM TC 99M SESTAMIBI GENERIC - CARDIOLITE
32.0000 | Freq: Once | INTRAVENOUS | Status: AC | PRN
  Administered 2015-07-26: 32 via INTRAVENOUS

## 2015-07-26 MED ORDER — REGADENOSON 0.4 MG/5ML IV SOLN
0.4000 mg | Freq: Once | INTRAVENOUS | Status: AC
  Administered 2015-07-26: 0.4 mg via INTRAVENOUS

## 2015-07-26 MED FILL — FUROSEMIDE 40 MG TABLET: 40 | 30 days supply | Qty: 30 | Fill #4

## 2015-07-26 MED FILL — CITALOPRAM HBR 10 MG TABLET: 10 | 30 days supply | Qty: 30 | Fill #4

## 2015-07-26 MED FILL — ULORIC 40 MG TABLET: 40 | 30 days supply | Qty: 30 | Fill #0

## 2015-07-27 ENCOUNTER — Ambulatory Visit (HOSPITAL_COMMUNITY): Payer: 59 | Attending: Cardiovascular Disease

## 2015-07-27 LAB — MYOCARDIAL PERFUSION IMAGING
LV dias vol: 196 mL (ref 62–150)
LV sys vol: 134 mL
Peak HR: 86 {beats}/min
RATE: 0.32
Rest HR: 71 {beats}/min
SDS: 0
SRS: 6
SSS: 6
TID: 1.11

## 2015-07-27 MED ORDER — TECHNETIUM TC 99M SESTAMIBI GENERIC - CARDIOLITE
31.5000 | Freq: Once | INTRAVENOUS | Status: AC | PRN
  Administered 2015-07-27: 32 via INTRAVENOUS

## 2015-07-28 ENCOUNTER — Telehealth: Payer: Self-pay | Admitting: *Deleted

## 2015-07-28 NOTE — Telephone Encounter (Signed)
Would refer to urology for further advise Kirk Ruths

## 2015-07-28 NOTE — Telephone Encounter (Signed)
Spoke with pt regarding stress test results. He reports dr Caryl Comes took him off the rapaflo or flomax, he has an enlarged prostate and needs something to help him start the stream. He is having trouble and wants to know if he can restart. Will forward for dr Stanford Breed review

## 2015-07-28 NOTE — Telephone Encounter (Signed)
-----   Message from Lelon Perla, MD sent at 07/28/2015  9:52 AM EDT ----- Defect likely represents attenuation; would not cath [facility]

## 2015-07-29 NOTE — Telephone Encounter (Signed)
Follow up   Pt returning call to RN

## 2015-07-30 NOTE — Telephone Encounter (Signed)
Spoke with pt, aware pt should contact the urology office.

## 2015-08-11 MED FILL — CARVEDILOL 12.5 MG TABLET: 12.5 | 30 days supply | Qty: 60 | Fill #1

## 2015-08-11 MED FILL — RAPAFLO 8 MG CAPSULE: 8 | 30 days supply | Qty: 30 | Fill #3

## 2015-08-12 MED FILL — IRBESARTAN 75 MG TABLET: 75 | 30 days supply | Qty: 30 | Fill #1

## 2015-08-27 MED FILL — ULORIC 40 MG TABLET: 40 | 30 days supply | Qty: 30 | Fill #1

## 2015-08-27 MED FILL — FUROSEMIDE 40 MG TABLET: 40 | 30 days supply | Qty: 30 | Fill #5

## 2015-08-27 MED FILL — SPIRONOLACTONE 25 MG TABLET: 25 | 90 days supply | Qty: 45 | Fill #2

## 2015-09-02 ENCOUNTER — Other Ambulatory Visit: Payer: Self-pay | Admitting: *Deleted

## 2015-09-02 ENCOUNTER — Ambulatory Visit (HOSPITAL_BASED_OUTPATIENT_CLINIC_OR_DEPARTMENT_OTHER): Payer: 59

## 2015-09-02 ENCOUNTER — Other Ambulatory Visit (HOSPITAL_BASED_OUTPATIENT_CLINIC_OR_DEPARTMENT_OTHER): Payer: 59

## 2015-09-02 ENCOUNTER — Encounter: Payer: Self-pay | Admitting: Hematology & Oncology

## 2015-09-02 ENCOUNTER — Ambulatory Visit (HOSPITAL_BASED_OUTPATIENT_CLINIC_OR_DEPARTMENT_OTHER): Payer: 59 | Admitting: Hematology & Oncology

## 2015-09-02 VITALS — BP 118/58 | HR 82 | Temp 97.5°F | Resp 16 | Ht 70.0 in | Wt 299.0 lb

## 2015-09-02 DIAGNOSIS — D45 Polycythemia vera: Secondary | ICD-10-CM

## 2015-09-02 DIAGNOSIS — M10051 Idiopathic gout, right hip: Secondary | ICD-10-CM

## 2015-09-02 DIAGNOSIS — G4701 Insomnia due to medical condition: Secondary | ICD-10-CM

## 2015-09-02 LAB — CBC WITH DIFFERENTIAL (CANCER CENTER ONLY)
BASO#: 0.2 10*3/uL (ref 0.0–0.2)
BASO%: 1.8 % (ref 0.0–2.0)
EOS%: 2.9 % (ref 0.0–7.0)
Eosinophils Absolute: 0.3 10*3/uL (ref 0.0–0.5)
HCT: 46.4 % (ref 38.7–49.9)
HGB: 14.6 g/dL (ref 13.0–17.1)
LYMPH#: 1.6 10*3/uL (ref 0.9–3.3)
LYMPH%: 16.6 % (ref 14.0–48.0)
MCH: 27.9 pg — ABNORMAL LOW (ref 28.0–33.4)
MCHC: 31.5 g/dL — ABNORMAL LOW (ref 32.0–35.9)
MCV: 89 fL (ref 82–98)
MONO#: 0.9 10*3/uL (ref 0.1–0.9)
MONO%: 8.8 % (ref 0.0–13.0)
NEUT#: 6.9 10*3/uL — ABNORMAL HIGH (ref 1.5–6.5)
NEUT%: 69.9 % (ref 40.0–80.0)
Platelets: 553 10*3/uL — ABNORMAL HIGH (ref 145–400)
RBC: 5.24 10*6/uL (ref 4.20–5.70)
RDW: 15.5 % (ref 11.1–15.7)
WBC: 9.9 10*3/uL (ref 4.0–10.0)

## 2015-09-02 LAB — IRON AND TIBC
%SAT: 6 % — ABNORMAL LOW (ref 20–55)
Iron: 27 ug/dL — ABNORMAL LOW (ref 42–163)
TIBC: 421 ug/dL — ABNORMAL HIGH (ref 202–409)
UIBC: 394 ug/dL — ABNORMAL HIGH (ref 117–376)

## 2015-09-02 LAB — URIC ACID: Uric Acid, Serum: 8.6 mg/dl — ABNORMAL HIGH (ref 2.6–7.4)

## 2015-09-02 LAB — COMPREHENSIVE METABOLIC PANEL
ALT: 62 U/L — ABNORMAL HIGH (ref 0–55)
AST: 48 U/L — ABNORMAL HIGH (ref 5–34)
Albumin: 3.7 g/dL (ref 3.5–5.0)
Alkaline Phosphatase: 90 U/L (ref 40–150)
Anion Gap: 11 mEq/L (ref 3–11)
BUN: 20.6 mg/dL (ref 7.0–26.0)
CO2: 21 mEq/L — ABNORMAL LOW (ref 22–29)
Calcium: 8.8 mg/dL (ref 8.4–10.4)
Chloride: 106 mEq/L (ref 98–109)
Creatinine: 1.3 mg/dL (ref 0.7–1.3)
EGFR: 56 mL/min/{1.73_m2} — ABNORMAL LOW (ref >90)
Glucose: 270 mg/dl — ABNORMAL HIGH (ref 70–140)
Potassium: 4.1 mEq/L (ref 3.5–5.1)
Sodium: 138 mEq/L (ref 136–145)
Total Bilirubin: 0.64 mg/dL (ref 0.20–1.20)
Total Protein: 7.2 g/dL (ref 6.4–8.3)

## 2015-09-02 LAB — CHCC SATELLITE - SMEAR

## 2015-09-02 LAB — FERRITIN: Ferritin: 17 ng/ml — ABNORMAL LOW (ref 22–316)

## 2015-09-02 MED ORDER — COLCHICINE 0.6 MG PO TABS: 0.6000 mg | ORAL_TABLET | Freq: Two times a day (BID) | ORAL | Status: DC

## 2015-09-02 NOTE — Progress Notes (Signed)
William Villanueva presents today for phlebotomy per MD orders. Phlebotomy procedure started at 1030 and ended at 1040. 500 grams removed. Patient observed for 30 minutes after procedure without any incident. Patient tolerated procedure well. IV needle removed intact.  PAtient Blood sugar 270.  Dr. Marin Olp notified.  Dr. Marin Olp told patient that he needs to see his primary care physician ASAP regarding his blood sugar.  Patient has appointment today with primary care physicia.  Labs given to patient

## 2015-09-02 NOTE — Patient Instructions (Signed)

## 2015-09-03 NOTE — Progress Notes (Signed)
Hematology and Oncology Follow Up Visit  William Villanueva [id number] 1950/11/21 65 y.o. 09/03/2015   Principle Diagnosis:   Polycythemia vera-JAK2 positive  Heart block-[place] II  Current Therapy:    Hydrea 518m po BID  Aspirin 81 mg by mouth daily  Phlebotomy to maintain hematocrit below 45%     Interim History:  [name] back for followup. He is doing okay. He has had no cardiac issues. He does have some issues with his hands. It sounds like he may have carpal tunnel.  He's had no leg swelling. He does take Lasix to help with fluid retention.   He has been having some dizziness. He is seeing his family doctor for this..Marland KitchenHe is on 5 different blood pressure medications. I really don't think he has to be on 5 different blood pressure medications. I told him that he probably needs to talk to his cardiologist about this.  He is not sleeping well. I will try him on some trazodone at 100 mg by mouth daily at bedtime.  He is not exercising now. Because this, he is gaining quite a bit of weight. His back continues to give him some issues.  Overall, his performance status is ECOG 1.   Medications:  Current outpatient prescriptions:  .  aspirin 81 MG tablet, Take 81 mg by mouth daily., Disp: , Rfl:  .  carvedilol (COREG) 12.5 MG tablet, Take 1 tablet (12.5 mg total) by mouth 2 (two) times daily with a meal., Disp: 60 tablet, Rfl: 6 .  citalopram (CELEXA) 10 MG tablet, Take 10 mg by mouth daily., Disp: , Rfl: 1 .  colchicine 0.6 MG tablet, Take 1 tablet (0.6 mg total) by mouth 2 (two) times daily., Disp: 60 tablet, Rfl: 4 .  furosemide (LASIX) 40 MG tablet, Take 0.5 tablets (20 mg total) by mouth daily., Disp: 90 tablet, Rfl: 3 .  hydroxyurea (HYDREA) 500 MG capsule, Take 500 mg by mouth daily. May take with food to minimize GI side effects., Disp: , Rfl:  .  irbesartan (AVAPRO) 75 MG tablet, Take 1 tablet (75 mg total) by mouth daily., Disp: 30 tablet, Rfl: 6 .  omeprazole  (PRILOSEC) 20 MG capsule, Take 1 capsule by mouth daily., Disp: , Rfl:  .  polyvinyl alcohol (LIQUIFILM TEARS) 1.4 % ophthalmic solution, Place 1-2 drops into both eyes as needed for dry eyes., Disp: , Rfl:  .  silodosin (RAPAFLO) 8 MG CAPS capsule, Take 8 mg by mouth daily with breakfast. , Disp: , Rfl:  .  spironolactone (ALDACTONE) 25 MG tablet, Take 0.5 tablets (12.5 mg total) by mouth daily., Disp: 45 tablet, Rfl: 3 .  ULORIC 40 MG tablet, TAKE 1 TABLET BY MOUTH DAILY., Disp: 30 tablet, Rfl: 6 No current facility-administered medications for this visit.  Facility-Administered Medications Ordered in Other Visits:  .  0.9 %  sodium chloride infusion, , Intravenous, Continuous, PVolanda Napoleon MD, Stopped at 05/16/13 1115  Allergies:  Allergies  Allergen Reactions  . Prednisone Itching and Other (See Comments)    Abdominal pain  . Temazepam Other (See Comments)    Dizziness   . Trazodone And Nefazodone Other (See Comments)    Dizziness  . Wellbutrin [Bupropion] Hives    Past Medical History, Surgical history, Social history, and Family History were reviewed and updated.  Review of Systems: As above  Physical Exam:  height is 5' 10"  (1.778 m) and weight is 299 lb (135.626 kg). His oral temperature is 97.5 F (  36.4 C). His blood pressure is 118/58 and his pulse is 82. His respiration is 16.   Obese white male in no obvious distress. There is no adenopathy in the neck. Lungs are clear. Cardiac exam regular rate and rhythm. He has an occasional extra beat. He has no murmurs, rubs or bruits. Abdomen is obese but soft. Has good bowel sounds. There is no fluid wave. There is a palpable liver or spleen tip. Back exam shows a well healed laminectomy scar in the lumbar spine. Extremities shows no clubbing cyanosis or edema. He has good range of motion of his joints. He has good strength. Skin exam no rashes, ecchymoses or petechia. Neurological exam is nonfocal.  Lab Results  Component  Value Date   WBC 9.9 09/02/2015   HGB 14.6 09/02/2015   HCT 46.4 09/02/2015   MCV 89 09/02/2015   PLT 553* 09/02/2015     Chemistry      Component Value Date/Time   NA 138 09/02/2015 0926   NA 139 03/05/2015 0825   NA 135 10/29/2014 0849   K 4.1 09/02/2015 0926   K 5.0 03/05/2015 0825   K 3.8 10/29/2014 0849   CL 105 03/05/2015 0825   CL 103 10/29/2014 0849   CO2 21* 09/02/2015 0926   CO2 24 03/05/2015 0825   CO2 24 10/29/2014 0849   BUN 20.6 09/02/2015 0926   BUN 20 03/05/2015 0825   BUN 12 10/29/2014 0849   CREATININE 1.3 09/02/2015 0926   CREATININE 1.29* 03/05/2015 0825   CREATININE 1.47* 01/29/2015 0829      Component Value Date/Time   CALCIUM 8.8 09/02/2015 0926   CALCIUM 8.9 03/05/2015 0825   CALCIUM 8.6 10/29/2014 0849   ALKPHOS 90 09/02/2015 0926   ALKPHOS 90 01/29/2015 0829   ALKPHOS 80 10/29/2014 0849   AST 48* 09/02/2015 0926   AST 25 01/29/2015 0829   AST 24 10/29/2014 0849   ALT 62* 09/02/2015 0926   ALT 23 01/29/2015 0829   ALT 18 10/29/2014 0849   BILITOT 0.64 09/02/2015 0926   BILITOT 0.6 01/29/2015 0829   BILITOT 1.00 10/29/2014 0849         Impression and Plan: William Villanueva is 65 year old gentleman with polycythemia vera.   I will continue him on the Hydrea. I think That we have decreased the Hydrea up to 500 mg by mouth twice a day. His blood count is troublesome to me. I think we can get back down with a high dose of Hydrea.   We will phlebotomize him today.   Since we are changing the Hydrea dose, I want to see him back in one month...   Volanda Napoleon, MD 4/28/20174:12 PM

## 2015-09-15 MED FILL — IRBESARTAN 75 MG TABLET: 75 | 30 days supply | Qty: 30 | Fill #2

## 2015-09-15 MED FILL — CARVEDILOL 12.5 MG TABLET: 12.5 | 30 days supply | Qty: 60 | Fill #2

## 2015-09-15 MED FILL — RAPAFLO 8 MG CAPSULE: 8 | 30 days supply | Qty: 30 | Fill #4

## 2015-09-19 ENCOUNTER — Encounter: Payer: Self-pay | Admitting: Internal Medicine

## 2015-09-27 MED FILL — HYDROXYUREA 500 MG CAPSULE: 500 | 30 days supply | Qty: 90 | Fill #3

## 2015-09-27 MED FILL — FUROSEMIDE 40 MG TABLET: 40 | 30 days supply | Qty: 30 | Fill #6

## 2015-09-27 MED FILL — CITALOPRAM HBR 10 MG TABLET: 10 | 30 days supply | Qty: 30 | Fill #5

## 2015-09-28 ENCOUNTER — Telehealth: Payer: Self-pay | Admitting: Cardiology

## 2015-09-28 NOTE — Telephone Encounter (Signed)
New message      Pt calling cause the pacemaker beeped and the pt received a message on his cell phone said to call the heart clinic pt uncertain of what's going on.

## 2015-09-28 NOTE — Telephone Encounter (Signed)
Returned patient's call.  Patient reports that he received a call/message from someone asking him to call "[phone] to return call.  He states that the number trailed off and he could not hear the rest of it.  Advised patient that there are no notes about someone from our office calling him.  He has an appointment with Dr. Posey Pronto on 09/30/15, but he states he does not think this call was from their office as they have already called to confirm his appointment.  Patient is scheduled for a remote pacemaker transmission on 10/14/15 and is aware.  Advised patient that his home Merlin monitor is up to date and transmitting and that it has not shown any alerts or abnormalities.  Advised patient that the phone call he received was not related to his pacemaker.  He verbalizes understanding of explanation and denies additional questions or concerns at this time.

## 2015-09-28 NOTE — Telephone Encounter (Signed)
Routed to device pool

## 2015-09-30 ENCOUNTER — Encounter: Payer: Self-pay | Admitting: Neurology

## 2015-09-30 ENCOUNTER — Ambulatory Visit (INDEPENDENT_AMBULATORY_CARE_PROVIDER_SITE_OTHER): Payer: 59 | Admitting: Neurology

## 2015-09-30 VITALS — BP 110/70 | HR 72 | Ht 70.0 in | Wt 302.2 lb

## 2015-09-30 DIAGNOSIS — R202 Paresthesia of skin: Secondary | ICD-10-CM | POA: Diagnosis not present

## 2015-09-30 NOTE — Patient Instructions (Signed)
NCS/EMG of the upper extremities We will call you with the results and let you know the next step  Return to clinic in 2 months

## 2015-09-30 NOTE — Progress Notes (Signed)
[facility] Clinic Note - Initial Visit   Date: 09/30/2015  William Villanueva MRN: [id number] DOB: [birth year]   Dear Dr. Moreen Fowler:  Thank you for your kind referral of William Villanueva for consultation of bilateral hand paresthesias. Although her history is well known to you, please allow [place] to reiterate it for the purpose of our medical record. The patient was accompanied to the clinic by wife who also provides collateral information.     History of Present Illness: William Villanueva is a 65 y.o. right-handed Caucasian male with GERD, polycythemia vera, hypertension, gout, depression, prediabetes, AV block s/p PPM,  presenting for evaluation of bilateral hand paresthesias.    Starting around 2014, he noticed numbness over the all fingertips on the left and soon also involved the right hand.  He has localized tenderness over the thenar eminance with shooting pain into his thumb and index finger.  He has difficulty with fine motor movements such buttoning, picking up small items, and even opening jars.  Symptoms are constant and not exacerbated or alleviated by anything.  He does not wake up with hands falling asleep and denies flexing at the wrist.  He was a long distance truck driver for 11 years and endorses resting his elbow on the door always when driving.    He denies neck pain or similar paresthesias of the feet.  He has a long history of low back pain and underwent lumbar hemilaminectomy and decompression at L4-5 several years ago.  He is followed by Dr. Brien Few.   Out-side paper records, electronic medical record, and images have been reviewed where available and summarized as:  CT lumbar spine wo contrast 07/23/2015: Low lying conus tip, at the upper aspect of L3. No significant finding at L3-4 or above. Solid bridging anterior osteophytes from T11 that L1, and at L2-3. Previous right hemilaminectomy at L4-5. Disc degeneration with vacuum phenomenon and  endplate irregularity. Circumferential bulging of the disc. Facet degeneration with osteophytes. Foraminal stenosis bilaterally, right worse than left, would likely affect the L4 nerve roots. Chronic disc degeneration at L5-S1. Endplate and facet osteophytes result in foraminal encroachment bilaterally that would have potential to affect the L5 nerve roots.  Lab Results  Component Value Date   FERRITIN 17* 09/02/2015   Lab Results  Component Value Date   TSH 0.50 07/15/2015    Past Medical History  Diagnosis Date  . Polycythemia vera(238.4)   . Back pain   . Hemochromatosis   . Gout   . Hypertension   . Nephrolithiasis   . Cardiomyopathy ([place])     Nonischemic 45%.   [name] Hypospadias [birth year]    born with  . CHF (congestive heart failure) ([place])   . AV block, 2nd degree 2015    St. Jude Allure Quadra pulse generator [id number], model PM [id number]    Past Surgical History  Procedure Laterality Date  . Ankle surgery    . Pilonidal cyst excision    . Tonsillectomy    . Bi-ventricular pacemaker insertion N/A 04/29/2014    Procedure: BI-VENTRICULAR PACEMAKER INSERTION (CRT-P);  Surgeon: Deboraha Sprang, MD; Laterality: Left  St. Jude Allure Quadra pulse generator [id number], model PM [phone]     Medications:  Outpatient Encounter Prescriptions as of 09/30/2015  Medication Sig Note  . aspirin 81 MG tablet Take 81 mg by mouth daily.   . carvedilol (COREG) 12.5 MG tablet Take 1 tablet (12.5 mg total) by mouth 2 (two) times daily with a meal.   .  citalopram (CELEXA) 10 MG tablet Take 10 mg by mouth daily. 03/01/2015: Received from: External Pharmacy Received Sig:   . colchicine 0.6 MG tablet Take 1 tablet (0.6 mg total) by mouth 2 (two) times daily.   . furosemide (LASIX) 40 MG tablet Take 0.5 tablets (20 mg total) by mouth daily.   . hydroxyurea (HYDREA) 500 MG capsule Take 500 mg by mouth daily. May take with food to minimize GI side effects.   . irbesartan (AVAPRO) 75 MG tablet Take 1 tablet  (75 mg total) by mouth daily.   [name] omeprazole (PRILOSEC) 20 MG capsule Take 1 capsule by mouth daily.   . polyvinyl alcohol (LIQUIFILM TEARS) 1.4 % ophthalmic solution Place 1-2 drops into both eyes as needed for dry eyes.   . silodosin (RAPAFLO) 8 MG CAPS capsule Take 8 mg by mouth daily with breakfast.  04/07/2015: Received from: [facility]  . spironolactone (ALDACTONE) 25 MG tablet Take 0.5 tablets (12.5 mg total) by mouth daily.   [name] ULORIC 40 MG tablet TAKE 1 TABLET BY MOUTH DAILY.    Facility-Administered Encounter Medications as of 09/30/2015  Medication  . 0.9 %  sodium chloride infusion     Allergies:  Allergies  Allergen Reactions  . Prednisone Itching and Other (See Comments)    Abdominal pain  . Temazepam Other (See Comments)    Dizziness   . Trazodone And Nefazodone Other (See Comments)    Dizziness  . Wellbutrin [Bupropion] Hives    Family History: Family History  Problem Relation Age of Onset  . Heart disease Maternal Grandmother     Pacemaker, MI  . Stroke Mother     Social History: Social History  Substance Use Topics  . Smoking status: Former Smoker -- 1.00 packs/day for 44 years    Types: Cigarettes    Start date: 06/05/1968    Quit date: 04/05/2013  . Smokeless tobacco: Never Used     Comment: quit 2 years ago  . Alcohol Use: 0.0 oz/week    0 Standard drinks or equivalent per week     Comment: Occasional   Social History   Social History Narrative   Lives at home with wife in a one story home.  Has no children.  Does not work.  Getting workman's comp.  Education: 4 years trade school.     Review of Systems:  CONSTITUTIONAL: No fevers, chills, night sweats, or weight loss.   EYES: No visual changes or eye pain ENT: No hearing changes.  No history of nose bleeds.   RESPIRATORY: No cough, wheezing and shortness of breath.   CARDIOVASCULAR: Negative for chest pain, and palpitations.   GI: Negative for abdominal discomfort, blood in stools or  black stools.  No recent change in bowel habits.   GU:  No history of incontinence.   MUSCLOSKELETAL: +history of joint pain or swelling.  No myalgias.   SKIN: Negative for lesions, rash, and itching.   HEMATOLOGY/ONCOLOGY: Negative for prolonged bleeding, bruising easily, and swollen nodes.  No history of cancer.   ENDOCRINE: Negative for cold or heat intolerance, polydipsia or goiter.   PSYCH:  +depression or anxiety symptoms.   NEURO: As Above.   Vital Signs:  BP 110/70 mmHg  Pulse 72  Ht 5\' 10"  (1.778 m)  Wt 302 lb 4 oz (137.1 kg)  BMI 43.37 kg/m2  SpO2 94%   General Medical Exam:   General:  Obese appearing, comfortable.   Eyes/ENT: see cranial nerve examination.  Neck: No masses appreciated.  Full range of motion without tenderness.  No carotid bruits. Respiratory:  Clear to auscultation, good air entry bilaterally.   Cardiac:  Regular rate and rhythm, no murmur.   Extremities:  No deformities, edema, or skin discoloration.  Skin:  No rashes or lesions.  Neurological Exam: MENTAL STATUS including orientation to time, place, person, recent and remote memory, attention span and concentration, language, and fund of knowledge is normal.  Speech is not dysarthric.  CRANIAL NERVES: II:  No visual field defects.  Unremarkable fundi.   III-IV-VI: Pupils equal round and reactive to light.  Normal conjugate, extra-ocular eye movements in all directions of gaze.  No nystagmus.  Subtle left ptosis.   V:  Normal facial sensation.     VII:  Normal facial symmetry and movements.   VIII:  Normal hearing and vestibular function.   IX-X:  Normal palatal movement.   XI:  Normal shoulder shrug and head rotation.   XII:  Normal tongue strength and range of motion, no deviation or fasciculation.  MOTOR:  No atrophy, fasciculations or abnormal movements.  No pronator drift.  Tone is normal.    Right Upper Extremity:    Left Upper Extremity:    Deltoid  5/5   Deltoid  5/5   Biceps  5/5    Biceps  5/5   Triceps  5/5   Triceps  5/5   Wrist extensors  5/5   Wrist extensors  5/5   Wrist flexors  5/5   Wrist flexors  5/5   Finger extensors  5/5   Finger extensors  5/5   Finger flexors  5/5   Finger flexors  5/5   Dorsal interossei  5/5   Dorsal interossei  5/5   Abductor pollicis  5-/5   Abductor pollicis  4+/5   Tone (Ashworth scale)  0  Tone (Ashworth scale)  0   Right Lower Extremity:    Left Lower Extremity:    Hip flexors  5/5   Hip flexors  5/5   Hip extensors  5/5   Hip extensors  5/5   Knee flexors  5/5   Knee flexors  5/5   Knee extensors  5/5   Knee extensors  5/5   Dorsiflexors  5/5   Dorsiflexors  5/5   Plantarflexors  5/5   Plantarflexors  5/5   Toe extensors  5/5   Toe extensors  5/5   Toe flexors  5/5   Toe flexors  5/5   Tone (Ashworth scale)  0  Tone (Ashworth scale)  0   MSRs:  Right                                                                 Left brachioradialis 2+  brachioradialis 2+  biceps 2+  biceps 2+  triceps 2+  triceps 2+  patellar 2+  patellar 2+  ankle jerk 2+  ankle jerk 2+  Hoffman no  Hoffman no  plantar response down  plantar response down   SENSORY:  Normal and symmetric perception of light touch, pinprick, vibration, and proprioception.  Romberg's sign absent.  Tinel's sign negative at the wrist.   COORDINATION/GAIT: Normal finger-to- nose-finger.  Intact rapid alternating movements bilaterally.  Gait wide  based due to body habitus, stable. He can perform stressed gait with effort, too unsteady with tandem gait.   IMPRESSION: William Villanueva is a 65 year-old male with prediabetes referred for evaluation of bilateral hand paresthesias.  Exam shows mild weakness with bilateral abductor pollicus brevis muscles, making carpal tunnel syndrome very likely.  However, CTS would only involve sensory loss over the median distribution and he also complains of left 4th and 5th digit numbness. With his history of resting his elbow on the door  frame when driving, certainly ulnar neuropathy is a possibility.   NCS/EMG of the upper extremities will be performed to better characterize the nature of his symptoms.  I discussed using wrist splints, if this does turn out to be carpal tunnel syndrome.    From a general well-being stand point, weight loss strategies were discussed.  He admits to excessive caloric intake and lack of exercise because of low back pain. I encouraged him to start doing water exercises as this is less impact to his joints.   Return to clinic in 2 months.   The duration of this appointment visit was 30 minutes of face-to-face time with the patient.  Greater than 50% of this time was spent in counseling, explanation of diagnosis, planning of further management, and coordination of care.   Thank you for allowing me to participate in patient's care.  If I can answer any additional questions, I would be pleased to do so.    Sincerely,    Samad Thon K. Posey Pronto, DO

## 2015-09-30 NOTE — Progress Notes (Signed)
Note routed

## 2015-10-05 MED FILL — ULORIC 40 MG TABLET: 40 | 30 days supply | Qty: 30 | Fill #2

## 2015-10-08 ENCOUNTER — Ambulatory Visit: Payer: 59

## 2015-10-08 ENCOUNTER — Ambulatory Visit (HOSPITAL_BASED_OUTPATIENT_CLINIC_OR_DEPARTMENT_OTHER): Payer: 59 | Admitting: Hematology & Oncology

## 2015-10-08 ENCOUNTER — Encounter: Payer: Self-pay | Admitting: Hematology & Oncology

## 2015-10-08 ENCOUNTER — Other Ambulatory Visit (HOSPITAL_BASED_OUTPATIENT_CLINIC_OR_DEPARTMENT_OTHER): Payer: 59

## 2015-10-08 VITALS — BP 134/71 | HR 79 | Temp 97.3°F | Resp 22 | Ht 70.0 in | Wt 305.0 lb

## 2015-10-08 DIAGNOSIS — R300 Dysuria: Secondary | ICD-10-CM

## 2015-10-08 DIAGNOSIS — M10051 Idiopathic gout, right hip: Secondary | ICD-10-CM

## 2015-10-08 DIAGNOSIS — D45 Polycythemia vera: Secondary | ICD-10-CM | POA: Diagnosis not present

## 2015-10-08 LAB — CBC WITH DIFFERENTIAL (CANCER CENTER ONLY)
BASO#: 0.2 10*3/uL (ref 0.0–0.2)
BASO%: 2.5 % — ABNORMAL HIGH (ref 0.0–2.0)
EOS%: 2.8 % (ref 0.0–7.0)
Eosinophils Absolute: 0.3 10*3/uL (ref 0.0–0.5)
HCT: 42.8 % (ref 38.7–49.9)
HGB: 13.4 g/dL (ref 13.0–17.1)
LYMPH#: 1.7 10*3/uL (ref 0.9–3.3)
LYMPH%: 19.5 % (ref 14.0–48.0)
MCH: 27.9 pg — ABNORMAL LOW (ref 28.0–33.4)
MCHC: 31.3 g/dL — ABNORMAL LOW (ref 32.0–35.9)
MCV: 89 fL (ref 82–98)
MONO#: 0.9 10*3/uL (ref 0.1–0.9)
MONO%: 9.8 % (ref 0.0–13.0)
NEUT#: 5.8 10*3/uL (ref 1.5–6.5)
NEUT%: 65.4 % (ref 40.0–80.0)
Platelets: 323 10*3/uL (ref 145–400)
RBC: 4.81 10*6/uL (ref 4.20–5.70)
RDW: 16.5 % — ABNORMAL HIGH (ref 11.1–15.7)
WBC: 8.8 10*3/uL (ref 4.0–10.0)

## 2015-10-08 LAB — COMPREHENSIVE METABOLIC PANEL
ALT: 63 U/L — ABNORMAL HIGH (ref 0–55)
AST: 54 U/L — ABNORMAL HIGH (ref 5–34)
Albumin: 3.6 g/dL (ref 3.5–5.0)
Alkaline Phosphatase: 82 U/L (ref 40–150)
Anion Gap: 10 mEq/L (ref 3–11)
BUN: 20.2 mg/dL (ref 7.0–26.0)
CO2: 22 mEq/L (ref 22–29)
Calcium: 8.4 mg/dL (ref 8.4–10.4)
Chloride: 107 mEq/L (ref 98–109)
Creatinine: 1.3 mg/dL (ref 0.7–1.3)
EGFR: 58 mL/min/{1.73_m2} — ABNORMAL LOW (ref >90)
Glucose: 169 mg/dl — ABNORMAL HIGH (ref 70–140)
Potassium: 4.3 mEq/L (ref 3.5–5.1)
Sodium: 139 mEq/L (ref 136–145)
Total Bilirubin: 0.74 mg/dL (ref 0.20–1.20)
Total Protein: 7 g/dL (ref 6.4–8.3)

## 2015-10-08 LAB — URINALYSIS, MICROSCOPIC (CHCC SATELLITE)
Bilirubin (Urine): NEGATIVE
Blood: NEGATIVE
Glucose: NEGATIVE mg/dL
Ketones: NEGATIVE mg/dL
Nitrite: NEGATIVE
Protein: 30 mg/dL
RBC: NEGATIVE (ref 0–2)
Specific Gravity, Urine: 1.02 (ref 1.003–1.035)
Urobilinogen, UR: 0.2 mg/dL (ref 0.2–1)
pH: 6 (ref 4.60–8.00)

## 2015-10-08 LAB — LACTATE DEHYDROGENASE: LDH: 220 U/L (ref 125–245)

## 2015-10-08 LAB — FERRITIN: Ferritin: 21 ng/ml — ABNORMAL LOW (ref 22–316)

## 2015-10-08 LAB — IRON AND TIBC
%SAT: 8 % — ABNORMAL LOW (ref 20–55)
Iron: 31 ug/dL — ABNORMAL LOW (ref 42–163)
TIBC: 395 ug/dL (ref 202–409)
UIBC: 364 ug/dL (ref 117–376)

## 2015-10-08 LAB — CHCC SATELLITE - SMEAR

## 2015-10-08 NOTE — Progress Notes (Signed)
Hematology and Oncology Follow Up Visit  William Villanueva [id number] Feb 22, 1951 65 y.o. 10/08/2015   Principle Diagnosis:   Polycythemia vera-JAK2 positive  Heart block-Mobitz II  Current Therapy:    Hydrea 587m po BID  Aspirin 81 mg by mouth daily  Phlebotomy to maintain hematocrit below 45%     Interim History:  [name] back for followup. He is doing okay. He has had no cardiac issues. He does have some issues with his hands. It sounds like he may have carpal tunnel.  [place]. His back has been bothering him. I suppose this is no surprise. He is not able to exercise. Because of this, he is gaining weight.  His Hydrea doses doing okay for him. He's not having any nausea or vomiting.  He's had sleep issues. We did try him on some trazodone. This is helped a little bit.  He has had no rashes. He's had no cough. He's had no chest wall pain.  Overall, his performance status is ECOG 0..   Medications:  Current outpatient prescriptions:  .  aspirin 81 MG tablet, Take 81 mg by mouth daily., Disp: , Rfl:  .  carvedilol (COREG) 12.5 MG tablet, Take 1 tablet (12.5 mg total) by mouth 2 (two) times daily with a meal., Disp: 60 tablet, Rfl: 6 .  citalopram (CELEXA) 10 MG tablet, Take 10 mg by mouth daily., Disp: , Rfl: 1 .  colchicine 0.6 MG tablet, Take 1 tablet (0.6 mg total) by mouth 2 (two) times daily., Disp: 60 tablet, Rfl: 4 .  furosemide (LASIX) 40 MG tablet, Take 0.5 tablets (20 mg total) by mouth daily., Disp: 90 tablet, Rfl: 3 .  hydroxyurea (HYDREA) 500 MG capsule, Take 500 mg by mouth daily. May take with food to minimize GI side effects., Disp: , Rfl:  .  irbesartan (AVAPRO) 75 MG tablet, Take 1 tablet (75 mg total) by mouth daily., Disp: 30 tablet, Rfl: 6 .  omeprazole (PRILOSEC) 20 MG capsule, Take 1 capsule by mouth daily., Disp: , Rfl:  .  polyvinyl alcohol (LIQUIFILM TEARS) 1.4 % ophthalmic  solution, Place 1-2 drops into both eyes as needed for dry eyes., Disp: , Rfl:  .  silodosin (RAPAFLO) 8 MG CAPS capsule, Take 8 mg by mouth daily with breakfast. , Disp: , Rfl:  .  spironolactone (ALDACTONE) 25 MG tablet, Take 0.5 tablets (12.5 mg total) by mouth daily., Disp: 45 tablet, Rfl: 3 .  ULORIC 40 MG tablet, TAKE 1 TABLET BY MOUTH DAILY., Disp: 30 tablet, Rfl: 6 No current facility-administered medications for this visit.  Facility-Administered Medications Ordered in Other Visits:  .  0.9 %  sodium chloride infusion, , Intravenous, Continuous, PVolanda Napoleon MD, Stopped at 05/16/13 1115  Allergies:  Allergies  Allergen Reactions  . Prednisone Itching and Other (See Comments)    Abdominal pain  . Temazepam Other (See Comments)    Dizziness   . Trazodone And Nefazodone Other (See Comments)    Dizziness  . Wellbutrin [Bupropion] Hives    Past Medical History, Surgical history, Social history, and Family History were reviewed and updated.  Review of Systems: As above  Physical Exam:  height is 5' 10"  (1.778 m) and weight is 305 lb (138.347 kg). His oral temperature is 97.3 F (36.3 C). His blood pressure is 134/71 and his pulse is 79. His respiration is 22.   Obese white male in  no obvious distress. There is no adenopathy in the neck. Lungs are clear. Cardiac exam regular rate and rhythm. He has an occasional extra beat. He has no murmurs, rubs or bruits. Abdomen is obese but soft. Has good bowel sounds. There is no fluid wave. There is a palpable liver or spleen tip. Back exam shows a well healed laminectomy scar in the lumbar spine. Extremities shows no clubbing cyanosis or edema. He has good range of motion of his joints. He has good strength. Skin exam no rashes, ecchymoses or petechia. Neurological exam is nonfocal.  Lab Results  Component Value Date   WBC 8.8 10/08/2015   HGB 13.4 10/08/2015   HCT 42.8 10/08/2015   MCV 89 10/08/2015   PLT 323 10/08/2015      Chemistry      Component Value Date/Time   NA 139 10/08/2015 0807   NA 139 03/05/2015 0825   NA 135 10/29/2014 0849   K 4.3 10/08/2015 0807   K 5.0 03/05/2015 0825   K 3.8 10/29/2014 0849   CL 105 03/05/2015 0825   CL 103 10/29/2014 0849   CO2 22 10/08/2015 0807   CO2 24 03/05/2015 0825   CO2 24 10/29/2014 0849   BUN 20.2 10/08/2015 0807   BUN 20 03/05/2015 0825   BUN 12 10/29/2014 0849   CREATININE 1.3 10/08/2015 0807   CREATININE 1.29* 03/05/2015 0825   CREATININE 1.47* 01/29/2015 0829      Component Value Date/Time   CALCIUM 8.4 10/08/2015 0807   CALCIUM 8.9 03/05/2015 0825   CALCIUM 8.6 10/29/2014 0849   ALKPHOS 82 10/08/2015 0807   ALKPHOS 90 01/29/2015 0829   ALKPHOS 80 10/29/2014 0849   AST 54* 10/08/2015 0807   AST 25 01/29/2015 0829   AST 24 10/29/2014 0849   ALT 63* 10/08/2015 0807   ALT 23 01/29/2015 0829   ALT 18 10/29/2014 0849   BILITOT 0.74 10/08/2015 0807   BILITOT 0.6 01/29/2015 0829   BILITOT 1.00 10/29/2014 0849         Impression and Plan: William Villanueva is 65 year old gentleman with polycythemia vera.   I am glad that his platelet count is doing better. He does not need be phlebotomized. His Hydrea dose is where it needs to be a right now.   We will plan to get him back in 2 months.   I am sure that at some point, he will need to be phlebotomized. Hopefully we can get him through the summer.  I think they're going back up to [place] in July or August for a crab feast..   Volanda Napoleon, MD 6/2/20171:01 PM

## 2015-10-08 NOTE — Progress Notes (Signed)
No phlebotomy today per Dr. Marin Olp orders. HCT 42.8.

## 2015-10-14 ENCOUNTER — Ambulatory Visit (INDEPENDENT_AMBULATORY_CARE_PROVIDER_SITE_OTHER): Payer: 59 | Admitting: *Deleted

## 2015-10-14 ENCOUNTER — Telehealth: Payer: Self-pay | Admitting: Cardiology

## 2015-10-14 DIAGNOSIS — I442 Atrioventricular block, complete: Secondary | ICD-10-CM

## 2015-10-14 NOTE — Telephone Encounter (Signed)
New message     1. Has your device fired? no  2. Is you device beeping? no  3. Are you experiencing draining or swelling at device site? no  4. Are you calling to see if we received your device transmission? yes  5. Have you passed out? No   The pt wants to make the signal is coming through and everything is ok

## 2015-10-14 NOTE — Progress Notes (Signed)
Remote pacemaker transmission.   

## 2015-10-14 NOTE — Telephone Encounter (Signed)
Advised [name] of received transmission. I let him know that scheduled transmissions send automatically with his monitor. He verbalizes understanding.

## 2015-10-18 MED FILL — RAPAFLO 8 MG CAPSULE: 8 | 30 days supply | Qty: 30 | Fill #5

## 2015-10-18 MED FILL — CARVEDILOL 12.5 MG TABLET: 12.5 | 30 days supply | Qty: 60 | Fill #3

## 2015-10-18 MED FILL — IRBESARTAN 75 MG TABLET: 75 | 30 days supply | Qty: 30 | Fill #3

## 2015-10-18 MED FILL — OMEPRAZOLE DR 20 MG CAPSULE: 20 | 30 days supply | Qty: 60 | Fill #0

## 2015-10-19 ENCOUNTER — Ambulatory Visit (INDEPENDENT_AMBULATORY_CARE_PROVIDER_SITE_OTHER): Payer: 59 | Admitting: Neurology

## 2015-10-19 ENCOUNTER — Other Ambulatory Visit: Payer: Self-pay | Admitting: *Deleted

## 2015-10-19 DIAGNOSIS — G5603 Carpal tunnel syndrome, bilateral upper limbs: Secondary | ICD-10-CM

## 2015-10-19 DIAGNOSIS — R202 Paresthesia of skin: Secondary | ICD-10-CM | POA: Diagnosis not present

## 2015-10-19 MED ORDER — WRIST SPLINT/COCK-UP/RIGHT M MISC: 1.0000 | Freq: Once | Status: DC

## 2015-10-19 MED ORDER — WRIST SPLINT/COCK-UP/LEFT M MISC: 1.0000 | Freq: Once | Status: DC

## 2015-10-19 NOTE — Procedures (Signed)
[facility]  [address]  [place], [place] [address] Tel: [phone] Fax:  [phone] Test Date:  10/19/2015  Patient: William Villanueva DOB: April 25, 1951 Physician: Narda Amber, DO  Sex: Male Height: 5\' 10"  Ref Phys: Narda Amber, DO  ID#: [id number] Temp: 34.3C Technician: Jerilynn Mages. Dean   Patient Complaints: This is a 65 year old gentleman referred for evaluation of bilateral hand paresthesias, worse on the left.  NCV & EMG Findings: Extensive electrodiagnostic testing of the left upper extremity and additional studies of the right shows: 1. Left median sensory response is absent. Right median sensory response shows prolonged distal peak latency (5.3 ms) and reduced amplitude (9.0 V).  Bilateral ulnar sensory responses are within normal limits. 2. Left median motor response shows severely prolonged distal onset latency (10.8 ms) and reduced amplitude (2.1 mV).  The right median motor nerve showed prolonged distal onset latency (5.6 ms).  Both median motor responses show evidence of anomalous innervation to the abductor pollicis brevis as noted by a motor response when stimulating at the ulnar wrist, consistent with a Martin-Gruber anastomosis. Bilateral ulnar motor responses are within normal limits. 3. There is no evidence of active or chronic motor axon loss changes affecting any of the tested muscles. Motor unit configuration and recruitment pattern is within normal limits  Impression: 1. Left median neuropathy at or distal to the wrist, consistent with clinical diagnosis of carpal tunnel syndrome. Overall, these findings are severe in degree electrically. 2. Right median neuropathy at or distal to the wrist, consistent with clinical diagnosis of carpal tunnel syndrome. Overall, these findings are moderate in degree electrically. 3. There is no evidence of a cervical radiculopathy.   ___________________________ Narda Amber, DO    Nerve Conduction  Studies Anti Sensory Summary Table   Stim Site NR Peak (ms) Norm Peak (ms) P-T Amp (V) Norm P-T Amp  Left Median Anti Sensory (2nd Digit)  34.3C  Wrist NR  <3.8  >10  Right Median Anti Sensory (2nd Digit)  34.3C  Wrist    5.3 <3.8 9.0 >10  Left Ulnar Anti Sensory (5th Digit)  34.3C  Wrist    3.0 <3.2 6.2 >5  Right Ulnar Anti Sensory (5th Digit)  34.3C  Wrist    2.9 <3.2 7.2 >5   Motor Summary Table   Stim Site NR Onset (ms) Norm Onset (ms) O-P Amp (mV) Norm O-P Amp Site1 Site2 Delta-0 (ms) Dist (cm) Vel (m/s) Norm Vel (m/s)  Left Median Motor (Abd Poll Brev)  34.3C  Wrist    10.8 <4.0 2.1 >5 Elbow Wrist 5.6 29.0 52 >50  Elbow    16.4  1.9  Ulnar-wrist crossover Elbow 12.6 0.0    Ulnar-wrist crossover    3.8  3.7         Right Median Motor (Abd Poll Brev)  34.3C  Wrist    5.6 <4.0 7.3 >5 Elbow Wrist 5.4 28.0 52 >50  Elbow    11.0  6.9  Ulnar-wrist crossover Elbow 7.2 0.0    Ulnar-wrist crossover    3.8  2.9         Left Ulnar Motor (Abd Dig Minimi)  34.3C  Wrist    2.9 <3.1 7.8 >7 B Elbow Wrist 4.0 24.0 60 >50  B Elbow    6.9  7.7  A Elbow B Elbow 1.4 10.0 71 >50  A Elbow    8.3  7.8         Right Ulnar Motor (Abd Dig  Minimi)  34.3C  Wrist    2.3 <3.1 7.6 >7 B Elbow Wrist 4.4 24.0 55 >50  B Elbow    6.7  7.0  A Elbow B Elbow 1.7 10.0 59 >50  A Elbow    8.4  7.0          EMG   Side Muscle Ins Act Fibs Psw Fasc Number Recrt Dur Dur. Amp Amp. Poly Poly. Comment  Left 1stDorInt Nml Nml Nml Nml Nml Nml Nml Nml Nml Nml Nml Nml N/A  Left Abd Poll Brev Nml Nml Nml Nml Nml Nml Nml Nml Nml Nml Nml Nml N/A  Left Ext Indicis Nml Nml Nml Nml Nml Nml Nml Nml Nml Nml Nml Nml N/A  Left PronatorTeres Nml Nml Nml Nml Nml Nml Nml Nml Nml Nml Nml Nml N/A  Left Biceps Nml Nml Nml Nml Nml Nml Nml Nml Nml Nml Nml Nml N/A  Left Triceps Nml Nml Nml Nml Nml Nml Nml Nml Nml Nml Nml Nml N/A  Left Deltoid Nml Nml Nml Nml Nml Nml Nml Nml Nml Nml Nml Nml N/A  Right 1stDorInt Nml Nml Nml Nml Nml  Nml Nml Nml Nml Nml Nml Nml N/A  Right Abd Poll Brev Nml Nml Nml Nml Nml Nml Nml Nml Nml Nml Nml Nml N/A  Right PronatorTeres Nml Nml Nml Nml Nml Nml Nml Nml Nml Nml Nml Nml N/A      Waveforms:

## 2015-10-22 LAB — CUP PACEART REMOTE DEVICE CHECK
Battery Remaining Longevity: 94 mo
Battery Remaining Percentage: 95.5 %
Battery Voltage: 2.98 V
Brady Statistic AP VP Percent: 7 %
Brady Statistic AP VS Percent: 1 %
Brady Statistic AS VP Percent: 90 %
Brady Statistic AS VS Percent: 1 %
Brady Statistic RA Percent Paced: 4.8 %
Date Time Interrogation Session: 20170608063736
Implantable Lead Implant Date: 20151223
Implantable Lead Implant Date: 20151223
Implantable Lead Implant Date: 20151223
Implantable Lead Location: 753858
Implantable Lead Location: 753859
Implantable Lead Location: 753860
Lead Channel Impedance Value: 440 Ohm
Lead Channel Impedance Value: 440 Ohm
Lead Channel Impedance Value: 800 Ohm
Lead Channel Pacing Threshold Amplitude: 0.625 V
Lead Channel Pacing Threshold Amplitude: 1 V
Lead Channel Pacing Threshold Amplitude: 1.375 V
Lead Channel Pacing Threshold Pulse Width: 0.4 ms
Lead Channel Pacing Threshold Pulse Width: 0.4 ms
Lead Channel Pacing Threshold Pulse Width: 0.7 ms
Lead Channel Sensing Intrinsic Amplitude: 12 mV
Lead Channel Sensing Intrinsic Amplitude: 3.8 mV
Lead Channel Setting Pacing Amplitude: 1.625
Lead Channel Setting Pacing Amplitude: 2 V
Lead Channel Setting Pacing Amplitude: 2.375
Lead Channel Setting Pacing Pulse Width: 0.4 ms
Lead Channel Setting Pacing Pulse Width: 0.7 ms
Lead Channel Setting Sensing Sensitivity: 2 mV
Pulse Gen Model: 3242
Pulse Gen Serial Number: 7701275

## 2015-10-25 NOTE — Progress Notes (Signed)
HPI: FU cardiomyopathy/CHF. Echocardiogram in August of 2013 showed an ejection fraction of 35-40%. Cardiac catheterization in September of 2013 showed mild nonobstructive coronary disease. There was no hemodynamic evidence of restriction. Pulmonary capillary wedge pressure 10. Cardiac MRI in September of 2013 showed an ejection fraction of 34% with diffuse hypokinesis. There was no hyperenhancement or scar tissue and no evidence of cardiac hemochromatosis. TSH in September 2013 normal. 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 Patient subsequently had CRTP placed. Echo 2/16 showed EF 99-35, grade 1 diastolic dysfunction, moderate LAE. Nuclear study 2/16 showed EF 35, no ischemia. CTA 2/16 showed no PE. Nuclear study March 2017 showed ejection fraction 32. There was prior inferior infarct but no ischemia. Treated medically as felt likely attenuation artifact. Since last seen, He has some dyspnea unchanged. No chest pain, pedal edema or syncope.  Current Outpatient Prescriptions  Medication Sig Dispense Refill  . aspirin 81 MG tablet Take 81 mg by mouth daily.    . carvedilol (COREG) 12.5 MG tablet Take 1 tablet (12.5 mg total) by mouth 2 (two) times daily with a meal. 60 tablet 6  . citalopram (CELEXA) 10 MG tablet Take 10 mg by mouth daily.  1  . colchicine 0.6 MG tablet Take 1 tablet (0.6 mg total) by mouth 2 (two) times daily. 60 tablet 4  . Elastic Bandages & Supports (WRIST SPLINT/COCK-UP/LEFT M) MISC 1 each by Does not apply route once. 1 each 0  . Elastic Bandages & Supports (WRIST SPLINT/COCK-UP/RIGHT M) MISC 1 each by Does not apply route once. 1 each 0  . furosemide (LASIX) 40 MG tablet Take 0.5 tablets (20 mg total) by mouth daily. 90 tablet 3  . hydroxyurea (HYDREA) 500 MG capsule Take 500 mg by mouth daily. May take with food to minimize GI side effects.    . irbesartan (AVAPRO) 75 MG tablet Take 1 tablet (75 mg total) by mouth daily. 30 tablet 6  . omeprazole  (PRILOSEC) 20 MG capsule Take 1 capsule by mouth daily.    . polyvinyl alcohol (LIQUIFILM TEARS) 1.4 % ophthalmic solution Place 1-2 drops into both eyes as needed for dry eyes.    . silodosin (RAPAFLO) 8 MG CAPS capsule Take 8 mg by mouth daily with breakfast.     . spironolactone (ALDACTONE) 25 MG tablet Take 0.5 tablets (12.5 mg total) by mouth daily. 45 tablet 3  . ULORIC 40 MG tablet TAKE 1 TABLET BY MOUTH DAILY. 30 tablet 6   No current facility-administered medications for this visit.   Facility-Administered Medications Ordered in Other Visits  Medication Dose Route Frequency Provider Last Rate Last Dose  . 0.9 %  sodium chloride infusion   Intravenous Continuous Volanda Napoleon, MD   Stopped at 05/16/13 1115     Past Medical History  Diagnosis Date  . Polycythemia vera(238.4)   . Back pain   . Hemochromatosis   . Gout   . Hypertension   . Nephrolithiasis   . Cardiomyopathy ([place])     Nonischemic 45%.   [name] Hypospadias [birth year]    born with  . CHF (congestive heart failure) ([place])   . AV block, 2nd degree 2015    [employer] Allure Quadra pulse generator [id number], model PM [id number]    Past Surgical History  Procedure Laterality Date  . Ankle surgery    . Pilonidal cyst excision    . Tonsillectomy    . Bi-ventricular pacemaker insertion N/A 04/29/2014  Procedure: BI-VENTRICULAR PACEMAKER INSERTION (CRT-P);  Surgeon: Deboraha Sprang, MD; Laterality: Left  St. Jude Allure Quadra pulse generator [phone], model PM [phone]  . Posterior laminectomy / decompression lumbar spine      Social History   Social History  . Marital Status: Married    Spouse Name: N/A  . Number of Children: N/A  . Years of Education: N/A   Occupational History  . Not on file.   Social History Main Topics  . Smoking status: Former Smoker -- 1.00 packs/day for 44 years    Types: Cigarettes    Start date: 06/05/1968    Quit date: 04/05/2013  . Smokeless tobacco: Never Used     Comment: quit 2 years  ago  . Alcohol Use: 0.0 oz/week    0 Standard drinks or equivalent per week     Comment: Rare - once per month  . Drug Use: No  . Sexual Activity: Not on file   Other Topics Concern  . Not on file   Social History Narrative   Lives at home with wife in a one story home.  Has no children.  Does not work.  Getting workman's comp.  Education: 4 years trade school.     Family History  Problem Relation Age of Onset  . Heart disease Maternal Grandmother     Pacemaker, MI  . Stroke Mother   . Other Father     Deceased, car fell on him  . Diabetes Sister   . Hypertension Sister     ROS: no fevers or chills, productive cough, hemoptysis, dysphasia, odynophagia, melena, hematochezia, dysuria, hematuria, rash, seizure activity, orthopnea, PND, pedal edema, claudication. Remaining systems are negative.  Physical Exam: Well-developed obese in no acute distress.  Skin is warm and dry.  HEENT is normal.  Neck is supple.  Chest is clear to auscultation with normal expansion.  Cardiovascular exam is regular rate and rhythm.  Abdominal exam nontender or distended. No masses palpated. Extremities show no edema. neuro grossly intact

## 2015-10-27 ENCOUNTER — Encounter: Payer: Self-pay | Admitting: Cardiology

## 2015-10-27 MED FILL — METHSCOPOLAMINE BROM 5 MG T: 5 | 8 days supply | Qty: 30 | Fill #0

## 2015-11-02 ENCOUNTER — Encounter: Payer: Self-pay | Admitting: Cardiology

## 2015-11-02 ENCOUNTER — Ambulatory Visit (INDEPENDENT_AMBULATORY_CARE_PROVIDER_SITE_OTHER): Payer: 59 | Admitting: Cardiology

## 2015-11-02 VITALS — BP 128/76 | HR 85 | Ht 70.0 in | Wt 301.0 lb

## 2015-11-02 DIAGNOSIS — I5043 Acute on chronic combined systolic (congestive) and diastolic (congestive) heart failure: Secondary | ICD-10-CM

## 2015-11-02 DIAGNOSIS — Z950 Presence of cardiac pacemaker: Secondary | ICD-10-CM

## 2015-11-02 DIAGNOSIS — I428 Other cardiomyopathies: Secondary | ICD-10-CM | POA: Diagnosis not present

## 2015-11-02 DIAGNOSIS — E669 Obesity, unspecified: Secondary | ICD-10-CM | POA: Diagnosis not present

## 2015-11-02 DIAGNOSIS — I429 Cardiomyopathy, unspecified: Secondary | ICD-10-CM

## 2015-11-02 MED ORDER — SACUBITRIL-VALSARTAN 24-26 MG PO TABS: 1.0000 | ORAL_TABLET | Freq: Two times a day (BID) | ORAL | Status: DC

## 2015-11-02 NOTE — Assessment & Plan Note (Signed)
Patient is euvolemic on examination. Continue present dose of diuretics. 

## 2015-11-02 NOTE — Assessment & Plan Note (Signed)
Discussed weight loss.

## 2015-11-02 NOTE — Assessment & Plan Note (Signed)
Blood pressure controlled. Continue present medications. 

## 2015-11-02 NOTE — Assessment & Plan Note (Signed)
Followed by electrophysiology. 

## 2015-11-02 NOTE — Assessment & Plan Note (Signed)
Plan to discontinue Avapro. Begin entresto 24/26 BID in 48 hours; check BMET one week; continue coreg and present dose of diuretics.

## 2015-11-02 NOTE — Patient Instructions (Addendum)
Medication Instructions:   STOP AVAPRO  AFTER BEING OFF AVAPRO FOR 2 DAYS START EMTRESTO 24/26 MG ONE TABLET TWICE DAILY  Labwork:  Your physician recommends that you return for lab work in: [place]:  Your physician recommends that you schedule a follow-up appointment in: [place]

## 2015-11-03 ENCOUNTER — Telehealth: Payer: Self-pay | Admitting: Cardiology

## 2015-11-03 NOTE — Telephone Encounter (Signed)
Called available number, goes to message stating office closed and to call between 9am and 8pm. No way to leave message.

## 2015-11-03 NOTE — Telephone Encounter (Signed)
Spoke with sherry, questions regarding PA for entresto answered and sent to the company.

## 2015-11-03 NOTE — Telephone Encounter (Signed)
New message      William Villanueva was calling to help cover the medication for the pt but needs prior authorization.

## 2015-11-04 ENCOUNTER — Other Ambulatory Visit: Payer: Self-pay

## 2015-11-04 ENCOUNTER — Ambulatory Visit: Payer: Self-pay | Admitting: Hematology & Oncology

## 2015-11-05 ENCOUNTER — Telehealth: Payer: Self-pay | Admitting: Cardiology

## 2015-11-05 MED ORDER — SACUBITRIL-VALSARTAN 24-26 MG PO TABS: 1.0000 | ORAL_TABLET | Freq: Two times a day (BID) | ORAL | Status: DC

## 2015-11-05 MED FILL — ENTRESTO 24 MG-26 MG TABLET: 24-26 | 30 days supply | Qty: 60 | Fill #0

## 2015-11-05 NOTE — Telephone Encounter (Signed)
Returned call to [employer] - they notified me that patient's PA has gone through for the [facility] - authorized from June 29th 2017 to June 28th 2018.  Called patient to notify, he acknowledged he received notification from company but believes he will need samples to cover until receipt of medications.  I have placed a sample box at front desk, and patient is aware he may pick up.  No further needs at this time.  Routed to [place] as FYI.

## 2015-11-05 NOTE — Telephone Encounter (Signed)
New message    Need an update on patient medication - entresto

## 2015-11-08 ENCOUNTER — Other Ambulatory Visit: Payer: Self-pay

## 2015-11-08 DIAGNOSIS — R0602 Shortness of breath: Secondary | ICD-10-CM

## 2015-11-08 MED ORDER — FUROSEMIDE 40 MG PO TABS: 20.0000 mg | ORAL_TABLET | Freq: Every day | ORAL | Status: DC

## 2015-11-08 MED FILL — ULORIC 40 MG TABLET: 40 | 30 days supply | Qty: 30 | Fill #3

## 2015-11-08 MED FILL — FUROSEMIDE 40 MG TABLET: 40 | 90 days supply | Qty: 45 | Fill #0

## 2015-11-15 MED FILL — CARVEDILOL 12.5 MG TABLET: 12.5 | 30 days supply | Qty: 60 | Fill #4

## 2015-11-15 MED FILL — RAPAFLO 8 MG CAPSULE: 8 | 30 days supply | Qty: 30 | Fill #6

## 2015-11-15 MED FILL — OMEPRAZOLE DR 20 MG CAPSULE: 20 | 30 days supply | Qty: 60 | Fill #1

## 2015-11-17 NOTE — Telephone Encounter (Signed)
Opened in error

## 2015-11-25 MED FILL — CEFDINIR 300 MG CAPSULE: 300 | 10 days supply | Qty: 10 | Fill #0

## 2015-12-01 MED FILL — DOXYCYCLINE 100 MG TABLET: 100 | 10 days supply | Qty: 20 | Fill #0

## 2015-12-01 MED FILL — SPIRONOLACTONE 25 MG TABLET: 25 | 90 days supply | Qty: 45 | Fill #3

## 2015-12-01 MED FILL — CITALOPRAM HBR 10 MG TABLET: 10 | 60 days supply | Qty: 30 | Fill #0

## 2015-12-02 ENCOUNTER — Encounter: Payer: Self-pay | Admitting: Neurology

## 2015-12-02 ENCOUNTER — Ambulatory Visit (INDEPENDENT_AMBULATORY_CARE_PROVIDER_SITE_OTHER): Payer: 59 | Admitting: Neurology

## 2015-12-02 VITALS — BP 110/70 | HR 89 | Ht 70.0 in | Wt 299.0 lb

## 2015-12-02 DIAGNOSIS — G5603 Carpal tunnel syndrome, bilateral upper limbs: Secondary | ICD-10-CM | POA: Insufficient documentation

## 2015-12-02 NOTE — Progress Notes (Signed)
Follow-up Visit   Date: 12/02/15    William Villanueva MRN: [id number] DOB: [birth year]   Interim History: William Villanueva is a 65 y.o. right-handed Caucasian male with GERD, polycythemia vera, hypertension, gout, depression, prediabetes, AV block s/p PPM returning to the clinic for follow-up of bilateral carpal tunnel syndrome.  The patient was accompanied to the clinic by wife who also provides collateral information.    History of present illness: Starting around 2014, he noticed numbness over the all fingertips on the left and soon also involved the right hand.  He has localized tenderness over the thenar eminance with shooting pain into his thumb and index finger.  He has difficulty with fine motor movements such buttoning, picking up small items, and even opening jars.  Symptoms are constant and not exacerbated or alleviated by anything.  He does not wake up with hands falling asleep and denies flexing at the wrist.  He was a long distance truck driver for 11 years and endorses resting his elbow on the door always when driving.    He denies neck pain or similar paresthesias of the feet.  He has a long history of low back pain and underwent lumbar hemilaminectomy and decompression at L4-5 several years ago.  He is followed by Dr. Brien Few.   UPDATE 12/02/2015:  His EMG showed bilateral CTS, severe on the left and moderate on the right.  He has been using bilateral wrist splints and noticed significant improvement with almost resolution of numbness and he is able to discern sensation much better over the fingertips. Unfortunately, there has been no change in right hand pain and paresthesias.    Medications:  Current Outpatient Prescriptions on File Prior to Visit  Medication Sig Dispense Refill  . aspirin 81 MG tablet Take 81 mg by mouth daily.    . carvedilol (COREG) 12.5 MG tablet Take 1 tablet (12.5 mg total) by mouth 2 (two) times daily with a meal. 60 tablet 6  . citalopram (CELEXA)  10 MG tablet Take 10 mg by mouth daily.  1  . Elastic Bandages & Supports (WRIST SPLINT/COCK-UP/RIGHT M) MISC 1 each by Does not apply route once. 1 each 0  . furosemide (LASIX) 40 MG tablet Take 0.5 tablets (20 mg total) by mouth daily. 90 tablet 3  . hydroxyurea (HYDREA) 500 MG capsule Take 500 mg by mouth daily. May take with food to minimize GI side effects.    [name] omeprazole (PRILOSEC) 20 MG capsule Take 1 capsule by mouth daily.    . polyvinyl alcohol (LIQUIFILM TEARS) 1.4 % ophthalmic solution Place 1-2 drops into both eyes as needed for dry eyes.    . sacubitril-valsartan (ENTRESTO) 24-26 MG Take 1 tablet by mouth 2 (two) times daily. 28 tablet 0  . silodosin (RAPAFLO) 8 MG CAPS capsule Take 8 mg by mouth daily with breakfast.     . spironolactone (ALDACTONE) 25 MG tablet Take 0.5 tablets (12.5 mg total) by mouth daily. 45 tablet 3  . ULORIC 40 MG tablet TAKE 1 TABLET BY MOUTH DAILY. 30 tablet 6   Current Facility-Administered Medications on File Prior to Visit  Medication Dose Route Frequency Provider Last Rate Last Dose  . 0.9 %  sodium chloride infusion   Intravenous Continuous Volanda Napoleon, MD   Stopped at 05/16/13 1115    Allergies:  Allergies  Allergen Reactions  . Prednisone Itching and Other (See Comments)    Abdominal pain  . Temazepam Other (See Comments)  Dizziness   . Trazodone And Nefazodone Other (See Comments)    Dizziness  . Ibuprofen Itching  . Wellbutrin [Bupropion] Hives    Review of Systems:  CONSTITUTIONAL: No fevers, chills, night sweats, or weight loss.  EYES: No visual changes or eye pain ENT: No hearing changes.  No history of nose bleeds.   RESPIRATORY: No cough, wheezing and shortness of breath.   CARDIOVASCULAR: Negative for chest pain, and palpitations.   GI: Negative for abdominal discomfort, blood in stools or black stools.  No recent change in bowel habits.   GU:  No history of incontinence.   MUSCLOSKELETAL: No history of joint pain or  swelling.  No myalgias.   SKIN: Negative for lesions, rash, and itching.   ENDOCRINE: Negative for cold or heat intolerance, polydipsia or goiter.   PSYCH:  No depression or anxiety symptoms.   NEURO: As Above.   Vital Signs:  BP 110/70   Pulse 89   Ht 5' 10"  (1.778 m)   Wt 299 lb (135.6 kg)   SpO2 96%   BMI 42.90 kg/m   Neurological Exam: MENTAL STATUS including orientation to time, place, person, recent and remote memory, attention span and concentration, language, and fund of knowledge is normal.  Speech is not dysarthric.  CRANIAL NERVES:Face is symmetric.   MOTOR:  There is moderate atrophy of bilateral ABP muscles, worse on the left.  Left ABP is 4+/5 and right ABP is 5/5.  Remaining muscle groups are 5/5 including instrinsic hand muscles bilaterally.  Tone is normal.    MSRs:  Reflexes are 2+/4 throughout  SENSORY:  Intact to to pin prick and temperature over the median distribution.  COORDINATION/GAIT:   Gait wide-based due to body habitus, stable.   Data: NCS/EMG of the upper extremities 10/19/2015: 1. Left median neuropathy at or distal to the wrist, consistent with clinical diagnosis of carpal tunnel syndrome. Overall, these findings are severe in degree electrically. 2. Right median neuropathy at or distal to the wrist, consistent with clinical diagnosis of carpal tunnel syndrome. Overall, these findings are moderate in degree electrically. 3. There is no evidence of a cervical radiculopathy. 4. Incidentally, there is a Martin-Gruber anastomosis bilaterally, a normal variant.  IMPRESSION/PLAN: Bilateral carpal tunnel - moderate on the right and severe on the left.    - Referral to [facility] for steroid injection and evaluation for CTS release    - Continue using wrist splints, which has improved paresthesias on the left but made no difference on the right  - Avoid hyperflexion of the wrists  Return to clinic as needed   The duration of this appointment  visit was 25 minutes of face-to-face time with the patient.  Greater than 50% of this time was spent in counseling, explanation of diagnosis, planning of further management, and coordination of care.   Thank you for allowing me to participate in patient's care.  If I can answer any additional questions, I would be pleased to do so.    Sincerely,    Kyion Gautier K. Posey Pronto, DO

## 2015-12-02 NOTE — Patient Instructions (Addendum)
Continue to use your wrist splints We will refer you to [facility] for further evaluation Dr. Minna Merritts, ENT: [phone]

## 2015-12-03 MED FILL — ENTRESTO 24 MG-26 MG TABLET: 24-26 | 30 days supply | Qty: 60 | Fill #1

## 2015-12-03 NOTE — Progress Notes (Signed)
Faxed

## 2015-12-13 MED FILL — CARVEDILOL 12.5 MG TABLET: 12.5 | 30 days supply | Qty: 60 | Fill #5

## 2015-12-13 MED FILL — ULORIC 40 MG TABLET: 40 | 30 days supply | Qty: 30 | Fill #4

## 2015-12-21 ENCOUNTER — Encounter: Payer: 59 | Attending: Family Medicine

## 2015-12-21 DIAGNOSIS — Z713 Dietary counseling and surveillance: Secondary | ICD-10-CM | POA: Insufficient documentation

## 2015-12-21 DIAGNOSIS — E119 Type 2 diabetes mellitus without complications: Secondary | ICD-10-CM | POA: Insufficient documentation

## 2015-12-21 NOTE — Progress Notes (Signed)

## 2015-12-24 ENCOUNTER — Encounter: Payer: Self-pay | Admitting: *Deleted

## 2015-12-24 ENCOUNTER — Ambulatory Visit (HOSPITAL_BASED_OUTPATIENT_CLINIC_OR_DEPARTMENT_OTHER): Payer: 59 | Admitting: Hematology & Oncology

## 2015-12-24 ENCOUNTER — Encounter: Payer: Self-pay | Admitting: Hematology & Oncology

## 2015-12-24 ENCOUNTER — Other Ambulatory Visit (HOSPITAL_BASED_OUTPATIENT_CLINIC_OR_DEPARTMENT_OTHER): Payer: 59

## 2015-12-24 VITALS — BP 104/53 | HR 75 | Resp 18 | Ht 70.0 in | Wt 300.0 lb

## 2015-12-24 DIAGNOSIS — R300 Dysuria: Secondary | ICD-10-CM

## 2015-12-24 DIAGNOSIS — D45 Polycythemia vera: Secondary | ICD-10-CM | POA: Diagnosis not present

## 2015-12-24 LAB — IRON AND TIBC
%SAT: 6 % — ABNORMAL LOW (ref 20–55)
Iron: 26 ug/dL — ABNORMAL LOW (ref 42–163)
TIBC: 413 ug/dL — ABNORMAL HIGH (ref 202–409)
UIBC: 387 ug/dL — ABNORMAL HIGH (ref 117–376)

## 2015-12-24 LAB — CBC WITH DIFFERENTIAL (CANCER CENTER ONLY)
BASO#: 0.3 10*3/uL — ABNORMAL HIGH (ref 0.0–0.2)
BASO%: 3.2 % — ABNORMAL HIGH (ref 0.0–2.0)
EOS%: 5 % (ref 0.0–7.0)
Eosinophils Absolute: 0.5 10*3/uL (ref 0.0–0.5)
HCT: 44.2 % (ref 38.7–49.9)
HGB: 14 g/dL (ref 13.0–17.1)
LYMPH#: 1.6 10*3/uL (ref 0.9–3.3)
LYMPH%: 17 % (ref 14.0–48.0)
MCH: 27.8 pg — ABNORMAL LOW (ref 28.0–33.4)
MCHC: 31.7 g/dL — ABNORMAL LOW (ref 32.0–35.9)
MCV: 88 fL (ref 82–98)
MONO#: 0.9 10*3/uL (ref 0.1–0.9)
MONO%: 9.1 % (ref 0.0–13.0)
NEUT#: 6.2 10*3/uL (ref 1.5–6.5)
NEUT%: 65.7 % (ref 40.0–80.0)
Platelets: 487 10*3/uL — ABNORMAL HIGH (ref 145–400)
RBC: 5.04 10*6/uL (ref 4.20–5.70)
RDW: 15.7 % (ref 11.1–15.7)
WBC: 9.4 10*3/uL (ref 4.0–10.0)

## 2015-12-24 LAB — COMPREHENSIVE METABOLIC PANEL
ALT: 45 U/L (ref 0–55)
AST: 42 U/L — ABNORMAL HIGH (ref 5–34)
Albumin: 3.5 g/dL (ref 3.5–5.0)
Alkaline Phosphatase: 80 U/L (ref 40–150)
Anion Gap: 9 mEq/L (ref 3–11)
BUN: 20.2 mg/dL (ref 7.0–26.0)
CO2: 22 mEq/L (ref 22–29)
Calcium: 9 mg/dL (ref 8.4–10.4)
Chloride: 107 mEq/L (ref 98–109)
Creatinine: 1.3 mg/dL (ref 0.7–1.3)
EGFR: 59 mL/min/{1.73_m2} — ABNORMAL LOW (ref >90)
Glucose: 170 mg/dl — ABNORMAL HIGH (ref 70–140)
Potassium: 4.4 mEq/L (ref 3.5–5.1)
Sodium: 138 mEq/L (ref 136–145)
Total Bilirubin: 0.65 mg/dL (ref 0.20–1.20)
Total Protein: 7.2 g/dL (ref 6.4–8.3)

## 2015-12-24 LAB — FERRITIN: Ferritin: 17 ng/ml — ABNORMAL LOW (ref 22–316)

## 2015-12-24 LAB — LACTATE DEHYDROGENASE: LDH: 252 U/L — ABNORMAL HIGH (ref 125–245)

## 2015-12-24 MED FILL — RAPAFLO 8 MG CAPSULE: 8 | 30 days supply | Qty: 30 | Fill #7

## 2015-12-24 MED FILL — OMEPRAZOLE DR 20 MG CAPSULE: 20 | 30 days supply | Qty: 60 | Fill #2

## 2015-12-24 NOTE — Progress Notes (Signed)
Hematology and Oncology Follow Up Visit  William Villanueva [id number] 07/17/1950 65 y.o. 12/24/2015   Principle Diagnosis:   Polycythemia vera-JAK2 positive  Heart block-Mobitz II  Current Therapy:    Hydrea 5102m po BID, alternate with 500 mg a day.  Aspirin 81 mg by mouth daily  Phlebotomy to maintain hematocrit below 45%     Interim History:  Mr.  William Villanueva back for followup. He is doing okay. He has had no cardiac issues. He is post a have sinus surgery in October. This is tell with his sleep apnea. Apparent, he cannot wear a CPAP machine right now.   [place] feast. If showing a lot of pictures.   Otherwise, he is doing fairly well. He's had no back issues that are new. He has chronic back problems.   He's had no bleeding or bruising. He's had no leg swelling. He's had no cough or shortness of breath.   Overall, his performance status is ECOG 0..   Medications:  Current Outpatient Prescriptions:  .  aspirin 81 MG tablet, Take 81 mg by mouth daily., Disp: , Rfl:  .  docusate sodium (STOOL SOFTENER) 100 MG capsule, Take 100 mg by mouth., Disp: , Rfl:  .  doxycycline (VIBRA-TABS) 100 MG tablet, , Disp: , Rfl:  .  Elastic Bandages & Supports (WRIST SPLINT/COCK-UP/RIGHT M) MISC, 1 each by Does not apply route once., Disp: 1 each, Rfl: 0 .  furosemide (LASIX) 40 MG tablet, Take 0.5 tablets (20 mg total) by mouth daily., Disp: 90 tablet, Rfl: 3 .  hydroxyurea (HYDREA) 500 MG capsule, Take 500 mg by mouth daily. May take with food to minimize GI side effects., Disp: , Rfl:  .  omeprazole (PRILOSEC) 20 MG capsule, Take 1 capsule by mouth daily., Disp: , Rfl:  .  polyvinyl alcohol (LIQUIFILM TEARS) 1.4 % ophthalmic solution, Place 1-2 drops into both eyes as needed for dry eyes., Disp: , Rfl:  .  sacubitril-valsartan (ENTRESTO) 24-26 MG, Take 1 tablet by mouth 2 (two) times daily., Disp: 28 tablet, Rfl:  0 .  silodosin (RAPAFLO) 8 MG CAPS capsule, , Disp: , Rfl:  .  spironolactone (ALDACTONE) 25 MG tablet, Take 0.5 tablets (12.5 mg total) by mouth daily., Disp: 45 tablet, Rfl: 3 .  tamsulosin (FLOMAX) 0.4 MG CAPS capsule, Frequency:   Dosage:0   MG  Instructions:  Note:TAKE 1 CAPSULE Daily, Disp: , Rfl:  .  ULORIC 40 MG tablet, TAKE 1 TABLET BY MOUTH DAILY., Disp: 30 tablet, Rfl: 6 .  carvedilol (COREG) 12.5 MG tablet, Take 1 tablet (12.5 mg total) by mouth 2 (two) times daily with a meal., Disp: 60 tablet, Rfl: 6 .  isosorbide mononitrate (IMDUR) 30 MG 24 hr tablet, , Disp: , Rfl:  No current facility-administered medications for this visit.   Facility-Administered Medications Ordered in Other Visits:  .  0.9 %  sodium chloride infusion, , Intravenous, Continuous, PVolanda Napoleon MD, Stopped at 05/16/13 1115  Allergies:  Allergies  Allergen Reactions  . Prednisone Itching and Other (See Comments)    Abdominal pain  . Temazepam Other (See Comments)    Dizziness   . Trazodone And Nefazodone Other (See Comments)    Dizziness  . Ibuprofen Itching  . Wellbutrin [Bupropion] Hives    Past Medical History, Surgical history, Social history, and Family History were reviewed and updated.  Review of Systems: As  above  Physical Exam:  height is 5' 10"  (1.778 m) and weight is 300 lb (136.1 kg). His blood pressure is 104/53 (abnormal) and his pulse is 75. His respiration is 18.   Obese white male in no obvious distress. There is no adenopathy in the neck. Lungs are clear. Cardiac exam regular rate and rhythm. He has an occasional extra beat. He has no murmurs, rubs or bruits. Abdomen is obese but soft. Has good bowel sounds. There is no fluid wave. There is a palpable liver or spleen tip. Back exam shows a well healed laminectomy scar in the lumbar spine. Extremities shows no clubbing cyanosis or edema. He has good range of motion of his joints. He has good strength. Skin exam no rashes,  ecchymoses or petechia. Neurological exam is nonfocal.  Lab Results  Component Value Date   WBC 9.4 12/24/2015   HGB 14.0 12/24/2015   HCT 44.2 12/24/2015   MCV 88 12/24/2015   PLT 487 (H) 12/24/2015     Chemistry      Component Value Date/Time   NA 139 10/08/2015 0807   K 4.3 10/08/2015 0807   CL 105 03/05/2015 0825   CL 103 10/29/2014 0849   CO2 22 10/08/2015 0807   BUN 20.2 10/08/2015 0807   CREATININE 1.3 10/08/2015 0807      Component Value Date/Time   CALCIUM 8.4 10/08/2015 0807   ALKPHOS 82 10/08/2015 0807   AST 54 (H) 10/08/2015 0807   ALT 63 (H) 10/08/2015 0807   BILITOT 0.74 10/08/2015 0807         Impression and Plan: William Villanueva is 65 year old gentleman with polycythemia vera.   IWant to try to get his platelet count down a little bit more. As such, we will alternate 500 mg a day with 1000 mg a day of Hydrea.  He is going to have sinus surgery in October, on October 19. I want to see him back before then to make sure that his blood counts are okay. He does not need to be phlebotomized today.  He showed me a lot of pictures of when they went to [place] to eat crabs. It sounds like and looks like that a great time.   Volanda Napoleon, MD 8/18/201710:04 AM

## 2015-12-24 NOTE — Progress Notes (Signed)
No phlebotomy needed today per dr. Marin Olp

## 2015-12-28 DIAGNOSIS — E119 Type 2 diabetes mellitus without complications: Secondary | ICD-10-CM

## 2015-12-28 DIAGNOSIS — Z713 Dietary counseling and surveillance: Secondary | ICD-10-CM | POA: Diagnosis not present

## 2015-12-28 NOTE — Progress Notes (Signed)

## 2016-01-03 ENCOUNTER — Encounter: Payer: Self-pay | Admitting: Family Medicine

## 2016-01-03 ENCOUNTER — Other Ambulatory Visit: Payer: Self-pay | Admitting: Hematology & Oncology

## 2016-01-03 MED FILL — HYDROXYUREA 500 MG CAPSULE: 500 | 30 days supply | Qty: 90 | Fill #0 | Status: TO

## 2016-01-04 ENCOUNTER — Encounter: Payer: 59 | Attending: Family Medicine | Admitting: *Deleted

## 2016-01-04 DIAGNOSIS — Z713 Dietary counseling and surveillance: Secondary | ICD-10-CM | POA: Diagnosis not present

## 2016-01-04 DIAGNOSIS — E119 Type 2 diabetes mellitus without complications: Secondary | ICD-10-CM | POA: Diagnosis not present

## 2016-01-04 MED FILL — DOXYCYCLINE HYCLATE 100 MG: 100 | 10 days supply | Qty: 20 | Fill #0

## 2016-01-04 NOTE — Progress Notes (Signed)
Patient was seen on 01/04/16 for the third of a series of three diabetes self-management courses at the [facility].   Catalina Gravel the amount of activity recommended for healthy living . Describe activities suitable for individual needs . Identify ways to regularly incorporate activity into daily life . Identify barriers to activity and ways to over come these barriers  Identify diabetes medications being personally used and their primary action for lowering glucose and possible side effects . Describe role of stress on blood glucose and develop strategies to address psychosocial issues . Identify diabetes complications and ways to prevent them  Explain how to manage diabetes during illness . Evaluate success in meeting personal goal . Establish 2-3 goals that they will plan to diligently work on until they return for the  53-monthfollow-up visit  Goals:   I will count my carb choices at most meals and snacks  I will be active 30 minutes or more 3 times a week  Your patient has identified these potential barriers to change:  Motivation  Your patient has identified their diabetes self-care support plan as  Family Support Plan:  Attend Support Group as desired

## 2016-01-05 ENCOUNTER — Encounter: Payer: Self-pay | Admitting: Podiatry

## 2016-01-05 ENCOUNTER — Ambulatory Visit (HOSPITAL_BASED_OUTPATIENT_CLINIC_OR_DEPARTMENT_OTHER)
Admission: RE | Admit: 2016-01-05 | Discharge: 2016-01-05 | Disposition: A | Discharge status: Home / Self Care | Payer: 59 | Source: Ambulatory Visit | Attending: Podiatry | Admitting: Podiatry

## 2016-01-05 ENCOUNTER — Ambulatory Visit (INDEPENDENT_AMBULATORY_CARE_PROVIDER_SITE_OTHER): Payer: 59 | Admitting: Podiatry

## 2016-01-05 DIAGNOSIS — M778 Other enthesopathies, not elsewhere classified: Secondary | ICD-10-CM | POA: Insufficient documentation

## 2016-01-05 DIAGNOSIS — M779 Enthesopathy, unspecified: Secondary | ICD-10-CM

## 2016-01-05 DIAGNOSIS — R52 Pain, unspecified: Secondary | ICD-10-CM

## 2016-01-05 DIAGNOSIS — M19072 Primary osteoarthritis, left ankle and foot: Secondary | ICD-10-CM | POA: Diagnosis not present

## 2016-01-05 MED FILL — ENTRESTO 24 MG-26 MG TABLET: 24-26 | 30 days supply | Qty: 60 | Fill #2

## 2016-01-05 NOTE — Progress Notes (Signed)
   Subjective:    Patient ID: William Villanueva, male    DOB: 05-Nov-1950, 65 y.o.   MRN: [id number]  HPI  65 year old male presents the office of consent a left ankle, foot pain which is been ongoing the last couple weeks. He states he wore a new pair shoes and since then he has had pain to his left foot he points the outside aspect of his foot on the sinus tarsi his majority pain. He did break his left ankle in 1970 and he is borderline diabetic. He denies any specific injury or trauma. Mary does swell occasionally. Denies any redness or warmth.  Review of Systems  All other systems reviewed and are negative.      Objective:   Physical Exam General: AAO x3, NAD  Dermatological: Skin is warm, dry and supple bilateral. Nails x 10 are well manicured; remaining integument appears unremarkable at this time. There are no open sores, no preulcerative lesions, no rash or signs of infection present.  Vascular: Dorsalis Pedis artery and Posterior Tibial artery pedal pulses are 2/4 bilateral with immedate capillary fill time. Pedal hair growth present. No varicosities and no lower extremity edema present bilateral. There is no pain with calf compression, swelling, warmth, erythema.   Neruologic: Grossly intact via light touch bilateral. Vibratory intact via tuning fork bilateral. Protective threshold with Semmes Wienstein monofilament intact to all pedal sites bilateral. Patellar and Achilles deep tendon reflexes 2+ bilateral. No Babinski or clonus noted bilateral.   Musculoskeletal: There is tenderness mostly along the lateral aspect with sinus tarsi on the left foot. There is localized edema to this area and erythema or increase in warmth. There is also some discomfort with ankle joint range of motion. Ankle, subtalar joint range of motion is restricted. There is no specific area pinpoint bony tenderness there is no pain vibratory sensation. MMT 5/5.  Gait: Unassisted, Nonantalgic.      Assessment &  Plan:  65 year old male with capsulitis left sinus tarsi, osteoarthritis -Treatment options discussed including all alternatives, risks, and complications -Etiology of symptoms were discussed -X-rays ordered and reviewed with the patient. -This time discussed sinus tarsi injection into the left side. Nursing risks, complications and wishes to proceed. Under sterile conditions a mixture of dexamethasone local anesthetic was infiltrated into the area without couple complications. Post injection care was discussed. Recommended ankle brace which hasn't home.- -Ice to the area. -Follow-up as scheduled or sooner if any problems arise. In the meantime, encouraged to call the office with any questions, concerns, change in symptoms.   Celesta Gentile, DPM

## 2016-01-06 ENCOUNTER — Telehealth: Payer: Self-pay | Admitting: Internal Medicine

## 2016-01-06 NOTE — Telephone Encounter (Signed)
Returned pt.'s wife's phone call. Informed her that at this time there is no recall on her husband's device. I informed her that we would be continuing to monitor this and would notify them if anything changed. Wife verbalized understanding.

## 2016-01-06 NOTE — Telephone Encounter (Signed)
New message       Pt wife call about the type of pacemaker the pt has b/c she saw a recall on the news. Please call.

## 2016-01-11 ENCOUNTER — Other Ambulatory Visit: Payer: Self-pay | Admitting: *Deleted

## 2016-01-11 DIAGNOSIS — R0602 Shortness of breath: Secondary | ICD-10-CM

## 2016-01-11 MED FILL — CARVEDILOL 12.5 MG TABLET: 12.5 | 30 days supply | Qty: 60 | Fill #6

## 2016-01-11 MED FILL — ULORIC 40 MG TABLET: 40 | 30 days supply | Qty: 30 | Fill #5

## 2016-01-11 NOTE — Telephone Encounter (Signed)
Priority Sent On From To Message Type   01/11/2016 9:09 AM Roberts Gaudy, CMA Cristopher Estimable, RN Patient Calls   Comment: PT HAS BEEN TAKING HIS LASIX 40 MG INSTEAD OF 20 MG AND HE HAS RAN OUT OF MEDICATION, NEEDS MORE BUT PHARMACY WILL NOT FILL, WILL YOU CALL HER PLEASE AT HER REQUEST.    LMTCB to find out why patient is taking more lasix than Rx'ed  Refills on file for current dose furosemide (LASIX) 40 MG tablet 90 tablet 3 11/08/2015    Sig - Route: Take 0.5 tablets (20 mg total) by mouth daily. - Oral   E-Prescribing Status: Receipt confirmed by pharmacy (11/08/2015 5:33 PM EDT)

## 2016-01-12 ENCOUNTER — Encounter: Payer: Self-pay | Admitting: Cardiology

## 2016-01-12 MED ORDER — FUROSEMIDE 40 MG PO TABS: 20.0000 mg | ORAL_TABLET | Freq: Every day | ORAL | 0 refills | Status: DC

## 2016-01-12 MED FILL — FUROSEMIDE 20 MG TABLET: 20 | 90 days supply | Qty: 90 | Fill #0

## 2016-01-12 NOTE — Telephone Encounter (Signed)
Called patient's wife.   She states she thought patient was to take lasix 40mg  QD since at least his last visit (June 2017). Explained that only med change made at this visit was to start [facility].   Wife states patient has only been taking Entresto once daily - QHS - instead of BID d/t dizziness  Advised wife that if patient has side effects from medications, please notify our office before making med changes as the MD may can make different arrangments for med therapy.   She voiced understanding.   Informed her I will send in Rx for lasix 40mg  tablet - take 0.5 tablet QD (as prescribed) and will route message to Dr. Stanford Breed for further advice.

## 2016-01-12 NOTE — Telephone Encounter (Signed)
Pt has OV scheduled for 9/27 with MD Crenshaw @ 1030 at [facility] location.

## 2016-01-12 NOTE — Telephone Encounter (Signed)
Will need fu paov for med adjustment William Villanueva

## 2016-01-12 NOTE — Telephone Encounter (Signed)
This encounter was created in error - please disregard.

## 2016-01-18 IMAGING — CT CT ANGIO CHEST
3 of 8 series · 17 of 46 positions shown · IV contrast (Omni 300)
Comparison: Chest radiography same day.

CLINICAL DATA: Acute onset of extreme shortness of breath.

EXAM:
CT ANGIOGRAPHY CHEST WITH CONTRAST
TECHNIQUE: Multidetector CT imaging of the chest was performed using the
standard protocol during bolus administration of intravenous
contrast. Multiplanar CT image reconstructions and MIPs were
obtained to evaluate the vascular anatomy.
CONTRAST:  100mL OMNIPAQUE IOHEXOL 350 MG/ML SOLN

[Series 4: pe 3.0 i30f 3 · axial · 0.73mm/px · z∈[-322,-106]mm · 6 of 102 slices shown]
[im 15/102  lung]
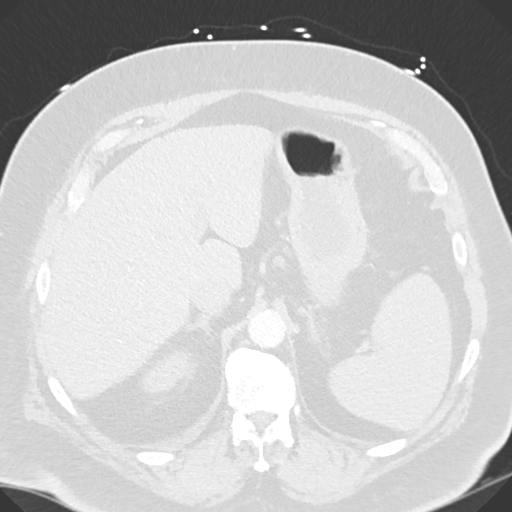
[im 29/102  soft-tissue]
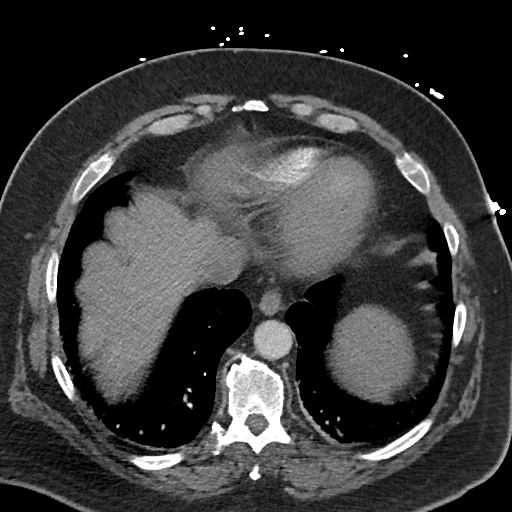
[im 44/102  lung]
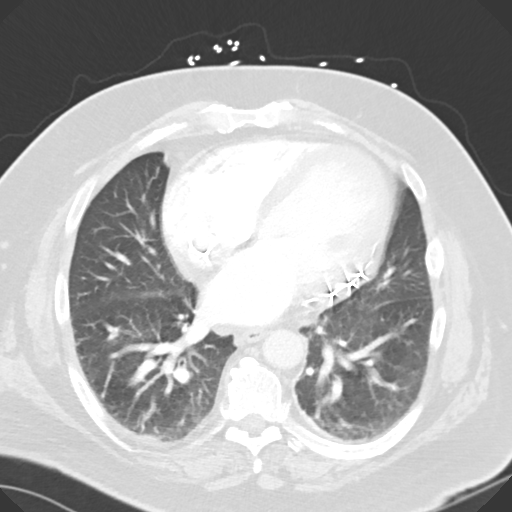
[im 58/102  soft-tissue]
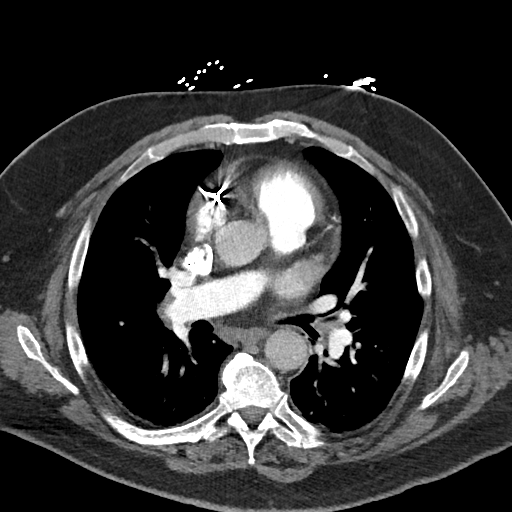
[im 73/102  lung]
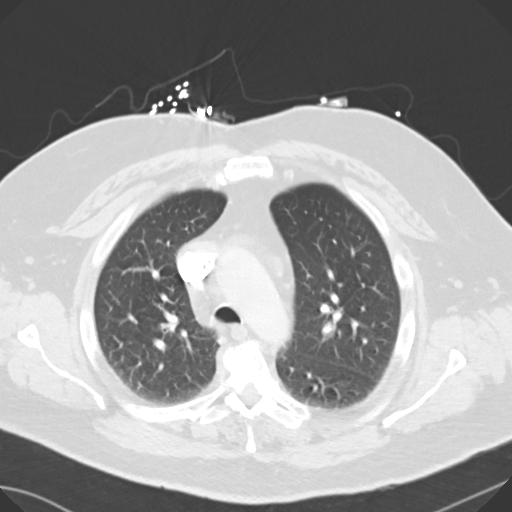
[im 87/102  soft-tissue]
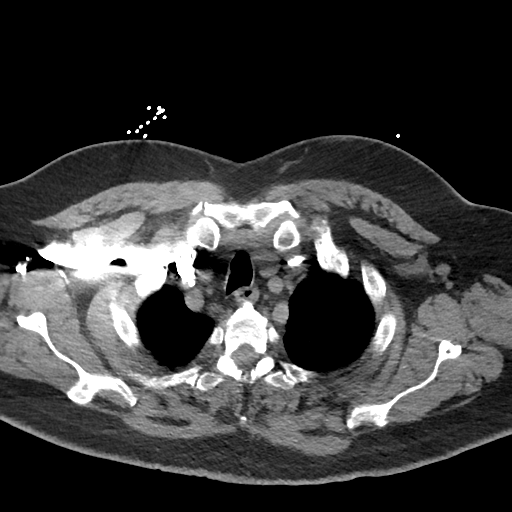

[Series 5: thins · axial · 0.73mm/px · z∈[-308,-105]mm · 8 of 251 slices shown]
[im 24/251  lung]
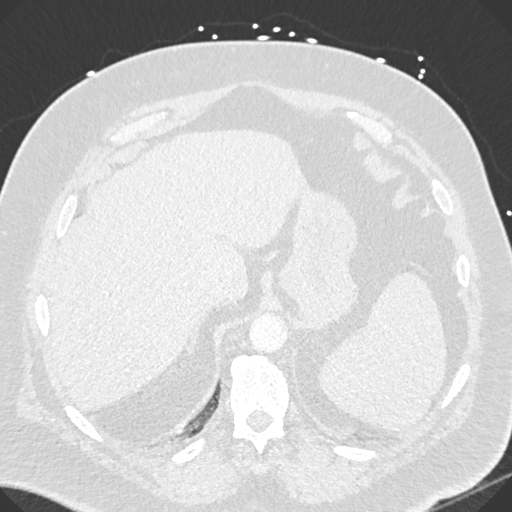
[im 48/251  lung]
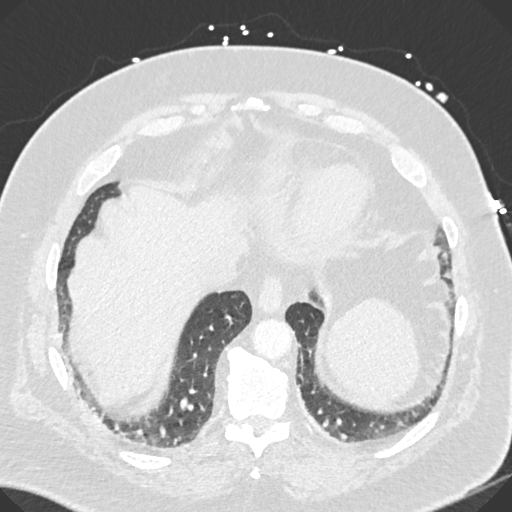
[im 84/251  lung]
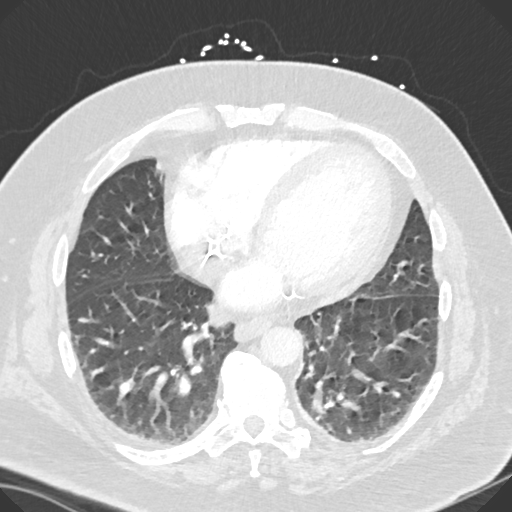
[im 108/251  lung]
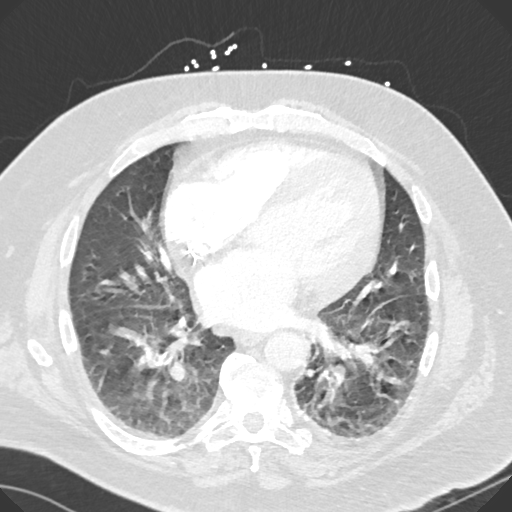
[im 143/251  lung]
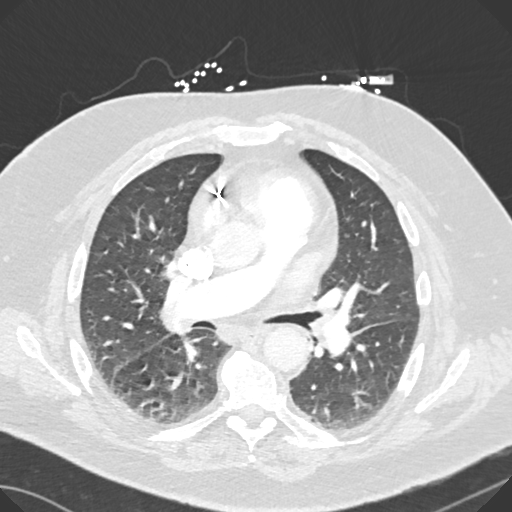
[im 167/251  lung]
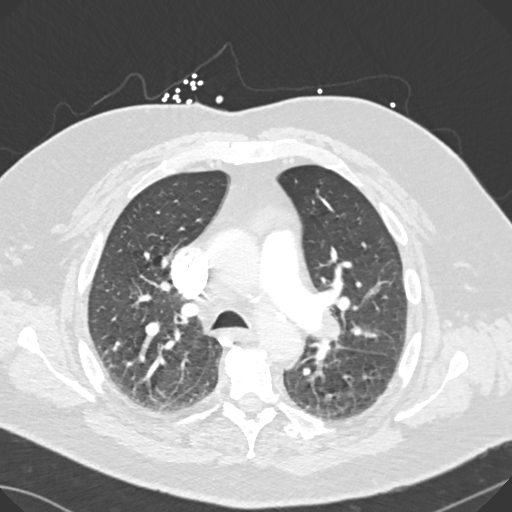
[im 203/251  lung]
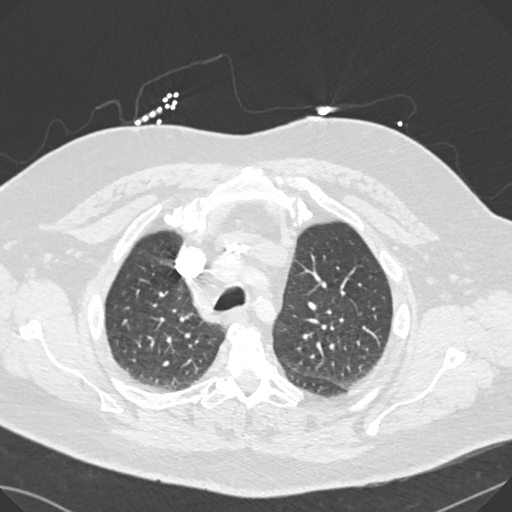
[im 227/251  lung]
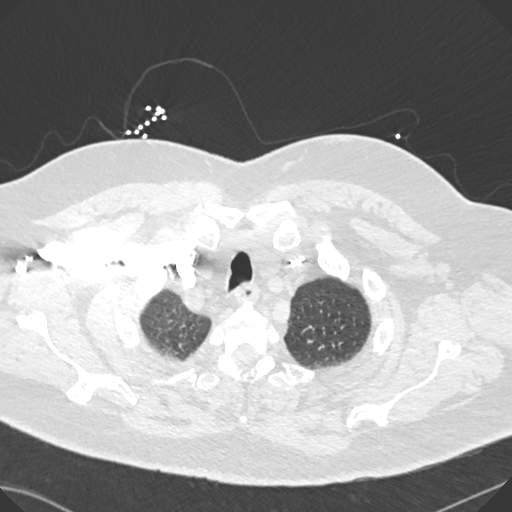

[Series 7: coronal mpr · coronal · 0.61mm/px · 3 of 158 slices shown]
[im 40/158  soft-tissue]
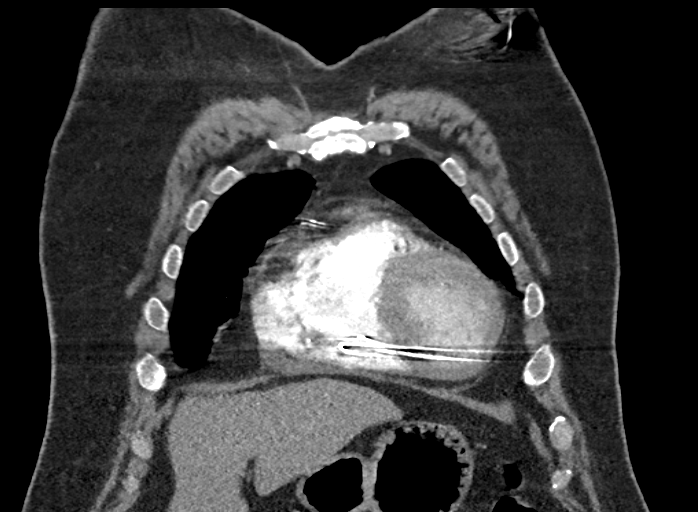
[im 79/158  soft-tissue]
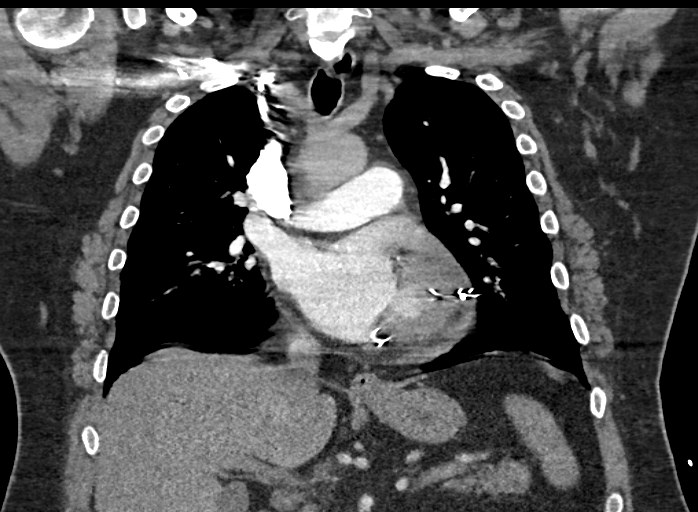
[im 118/158  soft-tissue]
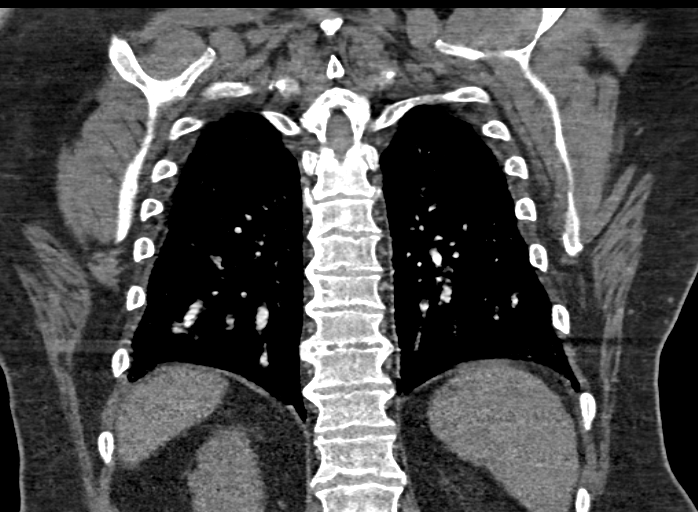

[17 of 46 positions shown; findings below may reference images not displayed]

FINDINGS: Pulmonary arterial opacification is moderate. There is moderate
motion degradation. There are no central pulmonary emboli. Distal
vessels can't be accurately evaluated.

The lung parenchyma is clear. No edema, infiltrate or collapse. No
focal lesion.

Normal hilar lymph nodes.

No pleural fluid. Tiny amount of pericardial fluid, small amount of
pericardial fluid.

Dual lead pacemaker in place. Coronary artery calcification is noted
in both the right and left systems. There is mild aortic
atherosclerosis.

Chronic spondylosis deformans of the spine with bridging
osteophytes. Mild loss of height of the T5 vertebral body which
looks old and healed.

No abnormality in the visualized upper abdomen.

Review of the MIP images confirms the above findings.
IMPRESSION: No demonstrable pulmonary emboli. Central vessels are clear. Distal
vessels are not well seen because of motion and only moderate
opacification.

No active pulmonary disease.

Coronary artery calcification.

Small amount of pericardial fluid.

## 2016-01-24 MED FILL — RAPAFLO 8 MG CAPSULE: 8 | 90 days supply | Qty: 90 | Fill #0

## 2016-01-25 ENCOUNTER — Encounter: Payer: Self-pay | Admitting: Podiatry

## 2016-01-25 ENCOUNTER — Ambulatory Visit (INDEPENDENT_AMBULATORY_CARE_PROVIDER_SITE_OTHER): Payer: 59 | Admitting: Podiatry

## 2016-01-25 DIAGNOSIS — M779 Enthesopathy, unspecified: Secondary | ICD-10-CM

## 2016-01-25 DIAGNOSIS — M19072 Primary osteoarthritis, left ankle and foot: Secondary | ICD-10-CM

## 2016-01-25 NOTE — Progress Notes (Signed)
Subjective: 65 year old male presents the office today for follow-up evaluation of left foot pain. He states the outside aspect of the foot is doing much better insert had some pain and set aspect. He basically vascular doctor before he saw me and he was placed in a heel lift on the right side as his right side with somewhat shorter. He had no consultations the last injection. No other complaints. Denies any systemic complaints such as fevers, chills, nausea, vomiting. No acute changes since last appointment, and no other complaints at this time.   Objective: AAO x3, NAD DP/PT pulses palpable bilaterally, CRT less than 3 seconds This this time. Tenderness to lateral aspect left sinus tarsi this appears to be resolved. There is trace residual edema but there is no sensory erythema or increase in warmth. There is restriction subtalar joint range of motion. There is no pain with ankle joint range of motion today. There is tense along the course of the flexor tendons on the medial ankle. Tenderness. Intact. There is no overlying edema, erythema, increase in warmth. No open lesions or pre-ulcerative lesions.  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  -At this time discussed further treatment options with the patient. He likely benefit from custom orthotics orthotics in his shoes here he was scanned for orthotics were sent to [facility] labs. Also discussed with him bracing on the left ankle. We will start with orthotics and if we need to try the brace we can. -Continue stretching, rehabilitation exercises for flexor tendinitis we discussed. Ice to the area. -Patient encouraged to call the office with any questions, concerns, change in symptoms.    Celesta Gentile, DPM

## 2016-01-27 MED FILL — CITALOPRAM HBR 10 MG TABLET: 10 | 32 days supply | Qty: 16 | Fill #0

## 2016-01-31 NOTE — Progress Notes (Signed)
HPI: FU cardiomyopathy/CHF. Echocardiogram in August of 2013 showed an ejection fraction of 35-40%. Cardiac catheterization in September of 2013 showed mild nonobstructive coronary disease. There was no hemodynamic evidence of restriction. Pulmonary capillary wedge pressure 10. Cardiac MRI in September of 2013 showed an ejection fraction of 34% with diffuse hypokinesis. There was no hyperenhancement or scar tissue and no evidence of cardiac hemochromatosis. TSH in September 2013 normal. 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 Patient subsequently had CRTP placed. Echo 2/16 showed EF 68-11, grade 1 diastolic dysfunction, moderate LAE. CTA 2/16 showed no PE. Nuclear study March 2017 showed ejection fraction 32. There was prior inferior infarct but no ischemia. Treated medically as felt likely attenuation artifact. Placed on entresto at last ov. Since last seen, patient has some dyspnea on exertion unchanged. No orthopnea, PND, pedal edema, exertional chest pain or syncope.  Current Outpatient Prescriptions  Medication Sig Dispense Refill  . aspirin 81 MG tablet Take 81 mg by mouth daily.    . carvedilol (COREG) 12.5 MG tablet Take 1 tablet (12.5 mg total) by mouth 2 (two) times daily with a meal. 60 tablet 6  . citalopram (CELEXA) 10 MG tablet Take 10 mg by mouth daily.     [name] docusate sodium (STOOL SOFTENER) 100 MG capsule Take 100 mg by mouth.    . furosemide (LASIX) 20 MG tablet Take 20 mg by mouth daily.     . hydroxyurea (HYDREA) 500 MG capsule Take 500 mg by mouth. Take one tablet by mouth every other day and two tablets by mouth the other days.    . irbesartan (AVAPRO) 75 MG tablet Take 75 mg by mouth daily.    [name] omeprazole (PRILOSEC) 20 MG capsule Take 1 capsule by mouth daily.    . polyvinyl alcohol (LIQUIFILM TEARS) 1.4 % ophthalmic solution Place 1-2 drops into both eyes as needed for dry eyes.    . sacubitril-valsartan (ENTRESTO) 24-26 MG Take 1 tablet by mouth 2  (two) times daily. (Patient taking differently: Take 1 tablet by mouth daily. ) 28 tablet 0  . silodosin (RAPAFLO) 8 MG CAPS capsule Take 8 mg by mouth.     . spironolactone (ALDACTONE) 25 MG tablet Take 0.5 tablets (12.5 mg total) by mouth daily. 45 tablet 3  . ULORIC 40 MG tablet TAKE 1 TABLET BY MOUTH DAILY. 30 tablet 6  . Elastic Bandages & Supports (WRIST SPLINT/COCK-UP/RIGHT M) MISC 1 each by Does not apply route once. 1 each 0   No current facility-administered medications for this visit.    Facility-Administered Medications Ordered in Other Visits  Medication Dose Route Frequency Provider Last Rate Last Dose  . 0.9 %  sodium chloride infusion   Intravenous Continuous Volanda Napoleon, MD   Stopped at 05/16/13 1115     Past Medical History:  Diagnosis Date  . AV block, 2nd degree 2015   St. Jude Allure Quadra pulse generator [id number], model PM 3242  . Back pain   . Cardiomyopathy ([place])    Nonischemic 45%.   . CHF (congestive heart failure) ([place])   . Gout   . Hemochromatosis   . Hypertension   . Hypospadias [birth year]   born with  . Nephrolithiasis   . Polycythemia vera(238.4)     Past Surgical History:  Procedure Laterality Date  . ANKLE SURGERY    . BI-VENTRICULAR PACEMAKER INSERTION N/A 04/29/2014   Procedure: BI-VENTRICULAR PACEMAKER INSERTION (CRT-P);  Surgeon: Deboraha Sprang, MD;  Laterality: Left  St. Jude Allure Quadra pulse generator [id number], model PM 3242  . PILONIDAL CYST EXCISION    . POSTERIOR LAMINECTOMY / DECOMPRESSION LUMBAR SPINE    . TONSILLECTOMY      Social History   Social History  . Marital status: Married    Spouse name: N/A  . Number of children: N/A  . Years of education: N/A   Occupational History  . Not on file.   Social History Main Topics  . Smoking status: Former Smoker    Packs/day: 1.00    Years: 44.00    Types: Cigarettes    Start date: 06/05/1968    Quit date: 04/05/2013  . Smokeless tobacco: Never Used     Comment: quit  2 years ago  . Alcohol use 0.0 oz/week     Comment: Rare - once per month  . Drug use: No  . Sexual activity: Not on file   Other Topics Concern  . Not on file   Social History Narrative   Lives at home with wife in a one story home.  Has no children.  Does not work.  Getting workman's comp.  Education: 4 years trade school.     Family History  Problem Relation Age of Onset  . Heart disease Maternal Grandmother     Pacemaker, MI  . Stroke Mother   . Other Father     Deceased, car fell on him  . Diabetes Sister   . Hypertension Sister     ROS: no fevers or chills, productive cough, hemoptysis, dysphasia, odynophagia, melena, hematochezia, dysuria, hematuria, rash, seizure activity, orthopnea, PND, pedal edema, claudication. Remaining systems are negative.  Physical Exam: Well-developed obese in no acute distress.  Skin is warm and dry.  HEENT is normal.  Neck is supple.  Chest is clear to auscultation with normal expansion.  Cardiovascular exam is regular rate and rhythm.  Abdominal exam nontender or distended. No masses palpated. Extremities show no edema. neuro grossly intact  ECG Sinus rhythm with occasional PVCs. Ventricular pacing.  A/P  1 Nonischemic cardiomyopathy-continue coreg; he is taking entresto once daily and irbesartan; DC irbesartan; increase entresto to BID in 2 days; check BMET one week. Plan to repeat echocardiogram once medication slowly titrated. He did have some dizziness on entresto BID previously and I have asked him to contact us if this is an issue moving forward.  2 CRT-followed by electrophysiology.  3 hypertension-blood pressure controlled. Continue present medications.  4 chronic systolic congestive heart failure-patient appears to be euvolemic on examination. Continue present dose of diuretic. Check potassium and renal function.   5 obesity-discussed weight loss.  6 preoperative evaluation-patient is scheduled for carpal tunnel surgery  and sinus surgery. He may proceed without further cardiac evaluation. Continue present medications pre-and postoperatively.  Kirk Ruths, MD

## 2016-02-01 ENCOUNTER — Encounter: Payer: Self-pay | Admitting: Podiatry

## 2016-02-01 ENCOUNTER — Ambulatory Visit (HOSPITAL_BASED_OUTPATIENT_CLINIC_OR_DEPARTMENT_OTHER): Payer: 59 | Admitting: Hematology & Oncology

## 2016-02-01 ENCOUNTER — Other Ambulatory Visit (HOSPITAL_BASED_OUTPATIENT_CLINIC_OR_DEPARTMENT_OTHER): Payer: 59

## 2016-02-01 VITALS — BP 123/56 | HR 80 | Temp 97.4°F | Resp 20 | Wt 301.0 lb

## 2016-02-01 DIAGNOSIS — D45 Polycythemia vera: Secondary | ICD-10-CM | POA: Diagnosis not present

## 2016-02-01 DIAGNOSIS — D473 Essential (hemorrhagic) thrombocythemia: Secondary | ICD-10-CM

## 2016-02-01 LAB — CBC WITH DIFFERENTIAL (CANCER CENTER ONLY)
BASO#: 0.2 10*3/uL (ref 0.0–0.2)
BASO%: 2.7 % — ABNORMAL HIGH (ref 0.0–2.0)
EOS%: 3.1 % (ref 0.0–7.0)
Eosinophils Absolute: 0.2 10*3/uL (ref 0.0–0.5)
HCT: 44 % (ref 38.7–49.9)
HGB: 13.8 g/dL (ref 13.0–17.1)
LYMPH#: 1.5 10*3/uL (ref 0.9–3.3)
LYMPH%: 19.3 % (ref 14.0–48.0)
MCH: 28 pg (ref 28.0–33.4)
MCHC: 31.4 g/dL — ABNORMAL LOW (ref 32.0–35.9)
MCV: 89 fL (ref 82–98)
MONO#: 0.5 10*3/uL (ref 0.1–0.9)
MONO%: 6.5 % (ref 0.0–13.0)
NEUT#: 5.2 10*3/uL (ref 1.5–6.5)
NEUT%: 68.4 % (ref 40.0–80.0)
Platelets: 273 10*3/uL (ref 145–400)
RBC: 4.92 10*6/uL (ref 4.20–5.70)
RDW: 16 % — ABNORMAL HIGH (ref 11.1–15.7)
WBC: 7.5 10*3/uL (ref 4.0–10.0)

## 2016-02-01 LAB — COMPREHENSIVE METABOLIC PANEL
ALT: 29 U/L (ref 0–55)
AST: 24 U/L (ref 5–34)
Albumin: 3.4 g/dL — ABNORMAL LOW (ref 3.5–5.0)
Alkaline Phosphatase: 78 U/L (ref 40–150)
Anion Gap: 13 mEq/L — ABNORMAL HIGH (ref 3–11)
BUN: 16.6 mg/dL (ref 7.0–26.0)
CO2: 20 mEq/L — ABNORMAL LOW (ref 22–29)
Calcium: 8.7 mg/dL (ref 8.4–10.4)
Chloride: 107 mEq/L (ref 98–109)
Creatinine: 1.3 mg/dL (ref 0.7–1.3)
EGFR: 59 mL/min/{1.73_m2} — ABNORMAL LOW (ref >90)
Glucose: 246 mg/dl — ABNORMAL HIGH (ref 70–140)
Potassium: 3.9 mEq/L (ref 3.5–5.1)
Sodium: 139 mEq/L (ref 136–145)
Total Bilirubin: 0.78 mg/dL (ref 0.20–1.20)
Total Protein: 6.9 g/dL (ref 6.4–8.3)

## 2016-02-01 LAB — LACTATE DEHYDROGENASE: LDH: 187 U/L (ref 125–245)

## 2016-02-01 NOTE — Progress Notes (Signed)
Hematology and Oncology Follow Up Visit  William Villanueva [id number] 1950-07-09 65 y.o. 02/01/2016   Principle Diagnosis:   Polycythemia vera-JAK2 positive  Heart block-Mobitz II  Current Therapy:    Hydrea 526m po BID, alternate with 500 mg a day.  Aspirin 81 mg by mouth daily  Phlebotomy to maintain hematocrit below 45%     Interim History:  [name] back for followup. He is doing okay. He has had no cardiac issues. He is going have 2 surgeries in October. His first surgery will be carpal tunnel surgery on his left hand. This will be October 4. He'll then have sinus surgery on October 19. We'll of these will be outpatient.  I'll see positive him having surgery. He is doing well the Hydrea. We adjusted his Hydrea dose and his platelet count has come down nicely.  He does not be phlebotomized. His hemoglobin and hematocrit have been holding pretty steady.  His back still bothersome. However, he is trying to be a little more active.  He's had no change in bowel or bladder habits.  Hopefully, the sinus surgery will help u[place] that he can get the CPAP to help with his sleep apnea.  Overall, his performance status is ECOG 0..   Medications:  Current Outpatient Prescriptions:  .  FLUoxetine HCl (RAPIFLUX PO), Take by mouth., Disp: , Rfl:  .  aspirin 81 MG tablet, Take 81 mg by mouth daily., Disp: , Rfl:  .  carvedilol (COREG) 12.5 MG tablet, Take 1 tablet (12.5 mg total) by mouth 2 (two) times daily with a meal., Disp: 60 tablet, Rfl: 6 .  docusate sodium (STOOL SOFTENER) 100 MG capsule, Take 100 mg by mouth., Disp: , Rfl:  .  doxycycline (VIBRA-TABS) 100 MG tablet, , Disp: , Rfl:  .  Elastic Bandages & Supports (WRIST SPLINT/COCK-UP/RIGHT M) MISC, 1 each by Does not apply route once., Disp: 1 each, Rfl: 0 .  furosemide (LASIX) 40 MG tablet, Take 0.5 tablets (20 mg total) by mouth daily., Disp: 45 tablet, Rfl: 0 .  hydroxyurea (HYDREA) 500 MG capsule, TAKE 1 CAPSULE BY  MOUTH 3 TIMES A DAY *MAY TAKE WITH FOOD TO MINIMIZE GI SIDE EFFECTS*, Disp: 90 capsule, Rfl: 3 .  isosorbide mononitrate (IMDUR) 30 MG 24 hr tablet, , Disp: , Rfl:  .  omeprazole (PRILOSEC) 20 MG capsule, Take 1 capsule by mouth daily., Disp: , Rfl:  .  polyvinyl alcohol (LIQUIFILM TEARS) 1.4 % ophthalmic solution, Place 1-2 drops into both eyes as needed for dry eyes., Disp: , Rfl:  .  sacubitril-valsartan (ENTRESTO) 24-26 MG, Take 1 tablet by mouth 2 (two) times daily., Disp: 28 tablet, Rfl: 0 .  silodosin (RAPAFLO) 8 MG CAPS capsule, , Disp: , Rfl:  .  spironolactone (ALDACTONE) 25 MG tablet, Take 0.5 tablets (12.5 mg total) by mouth daily., Disp: 45 tablet, Rfl: 3 .  ULORIC 40 MG tablet, TAKE 1 TABLET BY MOUTH DAILY., Disp: 30 tablet, Rfl: 6 No current facility-administered medications for this visit.   Facility-Administered Medications Ordered in Other Visits:  .  0.9 %  sodium chloride infusion, , Intravenous, Continuous, PVolanda Napoleon MD, Stopped at 05/16/13 1115  Allergies:  Allergies  Allergen Reactions  . Prednisone Itching and Other (See Comments)    Abdominal pain  . Temazepam Other (See Comments)    Dizziness   . Trazodone And Nefazodone Other (See Comments)    Dizziness  . Ibuprofen Itching  . Wellbutrin [Bupropion] Hives  Past Medical History, Surgical history, Social history, and Family History were reviewed and updated.  Review of Systems: As above  Physical Exam:  weight is 301 lb (136.5 kg) (abnormal). His oral temperature is 97.4 F (36.3 C). His blood pressure is 123/56 (abnormal) and his pulse is 80. His respiration is 20 and oxygen saturation is 94%.   Obese white male in no obvious distress. There is no adenopathy in the neck. Lungs are clear. Cardiac exam regular rate and rhythm. He has an occasional extra beat. He has no murmurs, rubs or bruits. Abdomen is obese but soft. Has good bowel sounds. There is no fluid wave. There is a palpable liver or  spleen tip. Back exam shows a well healed laminectomy scar in the lumbar spine. Extremities shows no clubbing cyanosis or edema. He has good range of motion of his joints. He has good strength. Skin exam no rashes, ecchymoses or petechia. Neurological exam is nonfocal.  Lab Results  Component Value Date   WBC 7.5 02/01/2016   HGB 13.8 02/01/2016   HCT 44.0 02/01/2016   MCV 89 02/01/2016   PLT 273 02/01/2016     Chemistry      Component Value Date/Time   NA 138 12/24/2015 0905   K 4.4 12/24/2015 0905   CL 105 03/05/2015 0825   CL 103 10/29/2014 0849   CO2 22 12/24/2015 0905   BUN 20.2 12/24/2015 0905   CREATININE 1.3 12/24/2015 0905      Component Value Date/Time   CALCIUM 9.0 12/24/2015 0905   ALKPHOS 80 12/24/2015 0905   AST 42 (H) 12/24/2015 0905   ALT 45 12/24/2015 0905   BILITOT 0.65 12/24/2015 0905         Impression and Plan: William Villanueva is 65 year old gentleman with polycythemia vera.   I do not see any problems with him having surgery for either the carpal tunnel for his left hand or for sinus surgery. His platelet count is under good control right now. I'm not going to change the dose of Hydrea.  I will go ahead and plan to get him back now in about 2 months. By then, he should have recovered from his surgeries.   Volanda Napoleon, MD 9/26/201711:39 AM

## 2016-02-02 ENCOUNTER — Telehealth: Payer: Self-pay | Admitting: *Deleted

## 2016-02-02 ENCOUNTER — Ambulatory Visit (INDEPENDENT_AMBULATORY_CARE_PROVIDER_SITE_OTHER): Payer: 59 | Admitting: Cardiology

## 2016-02-02 ENCOUNTER — Encounter: Payer: Self-pay | Admitting: Cardiology

## 2016-02-02 VITALS — BP 128/80 | HR 82 | Ht 70.0 in | Wt 299.0 lb

## 2016-02-02 DIAGNOSIS — I10 Essential (primary) hypertension: Secondary | ICD-10-CM

## 2016-02-02 DIAGNOSIS — I5022 Chronic systolic (congestive) heart failure: Secondary | ICD-10-CM | POA: Diagnosis not present

## 2016-02-02 DIAGNOSIS — I429 Cardiomyopathy, unspecified: Secondary | ICD-10-CM

## 2016-02-02 MED ORDER — SACUBITRIL-VALSARTAN 24-26 MG PO TABS: 1.0000 | ORAL_TABLET | Freq: Two times a day (BID) | ORAL | 12 refills | Status: DC

## 2016-02-02 MED FILL — ENTRESTO 24 MG-26 MG TABLET: 24-26 | 30 days supply | Qty: 60 | Fill #0

## 2016-02-02 NOTE — Telephone Encounter (Signed)
Office note with surgical clearance for left hand carpal tunnel release and right hand carpal tunnel injection faxed to the number provided.

## 2016-02-02 NOTE — Patient Instructions (Signed)
Medication Instructions:   STOP IRBESARTAN  INCREASE ENTRESTO TO ONE TABLET TWICE DAILY   Labwork:  Your physician recommends that you return for lab work in: [place]:  Your physician wants you to follow-up in: [place] will receive a reminder letter in the mail two months in advance. If you don't receive a letter, please call our office to schedule the follow-up appointment.   If you need a refill on your cardiac medications before your next appointment, please call your pharmacy.

## 2016-02-07 MED FILL — DOXYCYCLINE HYCLATE 100 MG: 100 | 14 days supply | Qty: 28 | Fill #0

## 2016-02-07 MED FILL — OMEPRAZOLE DR 20 MG CAPSULE: 20 | 30 days supply | Qty: 60 | Fill #3

## 2016-02-10 MED FILL — HYDROCODON-APAP 5-325: 5-325 | 10 days supply | Qty: 20 | Fill #0

## 2016-02-15 ENCOUNTER — Telehealth: Payer: Self-pay | Admitting: *Deleted

## 2016-02-15 LAB — BASIC METABOLIC PANEL
BUN: 22 mg/dL (ref 7–25)
CO2: 18 mmol/L — ABNORMAL LOW (ref 20–31)
Calcium: 8.8 mg/dL (ref 8.6–10.3)
Chloride: 105 mmol/L (ref 98–110)
Creat: 1.35 mg/dL — ABNORMAL HIGH (ref 0.70–1.25)
Glucose, Bld: 193 mg/dL — ABNORMAL HIGH (ref 65–99)
Potassium: 4.4 mmol/L (ref 3.5–5.3)
Sodium: 136 mmol/L (ref 135–146)

## 2016-02-15 NOTE — Telephone Encounter (Signed)
Dr Jacalyn Lefevre last office note containing clearance for sinus surgery faxed to the number provided.

## 2016-02-17 MED FILL — ULORIC 40 MG TABLET: 40 | 30 days supply | Qty: 30 | Fill #6

## 2016-02-24 ENCOUNTER — Other Ambulatory Visit: Payer: Self-pay | Admitting: Cardiology

## 2016-02-24 ENCOUNTER — Other Ambulatory Visit: Payer: Self-pay | Admitting: Internal Medicine

## 2016-02-24 DIAGNOSIS — R0602 Shortness of breath: Secondary | ICD-10-CM

## 2016-02-24 MED FILL — CITALOPRAM HBR 10 MG TABLET: 10 | 32 days supply | Qty: 16 | Fill #1

## 2016-02-24 MED FILL — DOXYCYCLINE HYCLATE 100 MG: 100 | 10 days supply | Qty: 20 | Fill #0

## 2016-02-24 MED FILL — HYDROXYUREA 500 MG CAPSULE: 500 | 30 days supply | Qty: 90 | Fill #1 | Status: TO

## 2016-02-24 MED FILL — CARVEDILOL 12.5 MG TABLET: 12.5 | 30 days supply | Qty: 60 | Fill #0 | Status: TO

## 2016-02-24 MED FILL — SPIRONOLACTONE 25 MG TABLET: 25 | 90 days supply | Qty: 45 | Fill #0

## 2016-02-24 NOTE — Telephone Encounter (Signed)
Pharmacy called. Phone in authorization given for carvedilol refill at current dose.

## 2016-02-24 NOTE — Telephone Encounter (Signed)
REFILL 

## 2016-03-03 MED FILL — OMEPRAZOLE DR 20 MG CAPSULE: 20 | 30 days supply | Qty: 60 | Fill #4

## 2016-03-03 MED FILL — OXYCODONE/APAP 5/325 MG TAB: 5-325 | 3 days supply | Qty: 20 | Fill #0

## 2016-03-03 MED FILL — ENTRESTO 24 MG-26 MG TABLET: 24-26 | 30 days supply | Qty: 60 | Fill #1

## 2016-03-08 ENCOUNTER — Ambulatory Visit (INDEPENDENT_AMBULATORY_CARE_PROVIDER_SITE_OTHER): Payer: 59 | Admitting: Internal Medicine

## 2016-03-08 ENCOUNTER — Encounter: Payer: Self-pay | Admitting: Internal Medicine

## 2016-03-08 VITALS — BP 110/70 | HR 74 | Ht 70.0 in | Wt 297.2 lb

## 2016-03-08 DIAGNOSIS — I428 Other cardiomyopathies: Secondary | ICD-10-CM

## 2016-03-08 DIAGNOSIS — Z950 Presence of cardiac pacemaker: Secondary | ICD-10-CM

## 2016-03-08 DIAGNOSIS — I442 Atrioventricular block, complete: Secondary | ICD-10-CM | POA: Diagnosis not present

## 2016-03-08 NOTE — Patient Instructions (Signed)
Medication Instructions: - Your physician recommends that you continue on your current medications as directed. Please refer to the Current Medication list given to you today.  Labwork: - none ordered  Procedures/Testing: - none ordered  Follow-Up: - Remote monitoring is used to monitor your Pacemaker of ICD from home. This monitoring reduces the number of office visits required to check your device to one time per year. It allows [place] to keep an eye on the functioning of your device to ensure it is working properly. You are scheduled for a device check from home on 06/07/46. You may send your transmission at any time that day. If you have a wireless device, the transmission will be sent automatically. After your physician reviews your transmission, you will receive a postcard with your next transmission date.  - Your physician wants you to follow-up in: 1 year with Dr. Caryl Comes. You will receive a reminder letter in the mail two months in advance. If you don't receive a letter, please call our office to schedule the follow-up appointment.  Any Additional Special Instructions Will Be Listed Below (If Applicable).     If you need a refill on your cardiac medications before your next appointment, please call your pharmacy.

## 2016-03-08 NOTE — Progress Notes (Signed)
Electrophysiology Office Note   Date:  03/08/2016   ID:  William Villanueva, DOB 1951/01/28, MRN [id number]  PCP:  [name], MD  Cardiologist:  [facility] Primary Electrophysiologist:    [name], MD    No chief complaint on file.    History of Present Illness: William Villanueva is a 65 y.o. male is   seen in follow-up for CRT-P implantation for nonischemic cardiomyopathy. He had presented to 12/15 with symptomatic high-grade heart block. Echo reevaluation 2/16 demonstrated near normalization of LV systolic function; he been readmitted at that time because of pain which dated back to his device implantation. Nuclear medicine 2/16 had an EF of 35%   3/17 Myoview 32% .  She's had a history of recurrent problems with chest pain following device implantation. Of more significant note over the last 3 or 4 days he has had left and right parasternal discomfort that is aggravated by exertion and relieved by rest.  He has also been having problems with progressive shortness of breath over recent months.  His weight is about 30 pounds in the last 5 months. 265--297  this is been associated with progressive fatigue.    Past Medical History:  Diagnosis Date  . AV block, 2nd degree 2015   St. Jude Allure Quadra pulse generator [id number], model PM 3242  . Back pain   . Cardiomyopathy ([place])    Nonischemic 45%.   . CHF (congestive heart failure) ([place])   . Gout   . Hemochromatosis   . Hypertension   . Hypospadias [birth year]   born with  . Nephrolithiasis   . 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 ANKLE SURGERY    . BI-VENTRICULAR PACEMAKER INSERTION N/A 04/29/2014   Procedure: BI-VENTRICULAR PACEMAKER INSERTION (CRT-P);  Surgeon: Deboraha Sprang, MD; Laterality: Left  St. Jude Allure Quadra pulse generator [id number], model PM [phone]  . PILONIDAL CYST EXCISION    . POSTERIOR LAMINECTOMY / DECOMPRESSION LUMBAR SPINE    . TONSILLECTOMY       Current  Outpatient Prescriptions  Medication Sig Dispense Refill  . aspirin 81 MG tablet Take 81 mg by mouth daily.    . carvedilol (COREG) 12.5 MG tablet TAKE 1 TABLET (12.5 MG TOTAL) BY MOUTH 2 (TWO) TIMES DAILY WITH A MEAL. 60 tablet 6  . citalopram (CELEXA) 10 MG tablet Take 10 mg by mouth daily.     [name] docusate sodium (STOOL SOFTENER) 100 MG capsule Take 100 mg by mouth.    Water engineer Bandages & Supports (WRIST SPLINT/COCK-UP/RIGHT M) MISC 1 each by Does not apply route once. 1 each 0  . furosemide (LASIX) 20 MG tablet Take 20 mg by mouth daily.     . hydroxyurea (HYDREA) 500 MG capsule Take 500 mg by mouth. Take one tablet by mouth every other day and two tablets by mouth the other days.    [name] omeprazole (PRILOSEC) 20 MG capsule Take 1 capsule by mouth daily.    . polyvinyl alcohol (LIQUIFILM TEARS) 1.4 % ophthalmic solution Place 1-2 drops into both eyes as needed for dry eyes.    . sacubitril-valsartan (ENTRESTO) 24-26 MG Take 1 tablet by mouth 2 (two) times daily. 60 tablet 12  . silodosin (RAPAFLO) 8 MG CAPS capsule Take 8 mg by mouth.     . spironolactone (ALDACTONE) 25 MG tablet TAKE 1/2 TABLET BY MOUTH DAILY 45 tablet 0  . ULORIC 40 MG tablet TAKE 1 TABLET BY MOUTH DAILY.  30 tablet 6   No current facility-administered medications for this visit.    Facility-Administered Medications Ordered in Other Visits  Medication Dose Route Frequency Provider Last Rate Last Dose  . 0.9 %  sodium chloride infusion   Intravenous Continuous Volanda Napoleon, MD   Stopped at 05/16/13 1115    Allergies:   Prednisone; Prozac [fluoxetine hcl]; Temazepam; Trazodone and nefazodone; Ibuprofen; and Wellbutrin [bupropion]   Social History:  The patient  reports that he quit smoking about 2 years ago. His smoking use included Cigarettes. He started smoking about 47 years ago. He has a 44.00 pack-year smoking history. He has never used smokeless tobacco. He reports that he drinks alcohol. He reports that he does not  use drugs.   Family History:  The patient's    family history includes Diabetes in his sister; Heart disease in his maternal grandmother; Hypertension in his sister; Other in his father; Stroke in his mother.    ROS:  Please see the history of present illness and past medical history  Otherwise, all other systems were reviewed and were negative except .     PHYSICAL EXAM: VS:  BP 110/70   Pulse 74   Ht 5' 10"  (1.778 m)   Wt 297 lb 3.2 oz (134.8 kg)   SpO2 95%   BMI 42.64 kg/m  , BMI Body mass index is 42.64 kg/m. GEN: Well nourished, well developed  Morbidly obese , in no acute distress  HEENT: normal  Neck:  JVD flat, carotid bruits, or masses Cardiac: REGULAR RATE and RHYTHM ;  No murmurs, rubs, + S4  Back without kyphosis; No CVAT Respiratory:  clear to auscultation bilaterally, normal work of breathing GI: soft, nontender, nondistended, + BS MS: no deformity or atrophy  Extremities no clubbing cyanosis  edema Skin: warm and dry,  device pocket is well healed without teathering Neuro:  Strength and sensation are intact Psych: euthymic mood, full affect  EKG:  EKG is ordered today Sinus rhythm at 78 with P synchronous pacing the QRS morphology is upright in V1 but also possibly 1  Device interrogation is reviewed today in detail.  See PaceArt for details.   Recent Labs: 04/07/2015: Brain Natriuretic Peptide 184.7 07/15/2015: TSH 0.50 02/01/2016: ALT 29; HGB 13.8; Platelets 273 02/14/2016: BUN 22; Creat 1.35; Potassium 4.4; Sodium 136    Lipid Panel     Component Value Date/Time   CHOL 126 04/29/2014 0139   TRIG 146 04/29/2014 0139   HDL 29 (L) 04/29/2014 0139   CHOLHDL 4.3 04/29/2014 0139   VLDL 29 04/29/2014 0139   LDLCALC 68 04/29/2014 0139     Wt Readings from Last 3 Encounters:  03/08/16 297 lb 3.2 oz (134.8 kg)  02/02/16 299 lb (135.6 kg)  02/01/16 (!) 301 lb (136.5 kg)      Other studies Reviewed: Additional studies/ records that were reviewed today  include Dr Morrison Old notes describing the discontinuation of ACE and substitiution with ARB  ASSESSMENT AND PLAN:  NICM  CHF chronic sys/HFpFF  Hypertension  Pacemaker lead issue There evidence of atrial 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 There is no evidence of atrial fibrillation  Pacemaker-CRT-St. Jude The patient's device was interrogated.  The information was reviewed. Changes were made to address the above    BP well controlled  Euvolemic continue current meds       Current medicines are reviewed at length with the patient today.   The patient has concerns regarding his  medicines As above  Labs/ tests ordered today include:   TSH   Disposition:   FU with AS 12 m Signed, [name], MD  03/08/2016 2:59 PM     [place]  [phone] (office) [phone] (fax)

## 2016-03-09 LAB — CUP PACEART INCLINIC DEVICE CHECK
Date Time Interrogation Session: 20171102120814
Implantable Lead Implant Date: 20151223
Implantable Lead Implant Date: 20151223
Implantable Lead Implant Date: 20151223
Implantable Lead Location: 753858
Implantable Lead Location: 753859
Implantable Lead Location: 753860
Implantable Pulse Generator Implant Date: 20151223
Pulse Gen Model: 3242
Pulse Gen Serial Number: 7701275

## 2016-03-23 MED FILL — CITALOPRAM HBR 10 MG TABLET: 10 | 32 days supply | Qty: 16 | Fill #2

## 2016-03-23 MED FILL — ULORIC 40 MG TABLET: 40 | 30 days supply | Qty: 30 | Fill #0

## 2016-03-23 MED FILL — CARVEDILOL 12.5 MG TABLET: 12.5 | 30 days supply | Qty: 60 | Fill #1 | Status: TO

## 2016-04-05 ENCOUNTER — Ambulatory Visit (HOSPITAL_BASED_OUTPATIENT_CLINIC_OR_DEPARTMENT_OTHER): Payer: 59 | Admitting: Hematology & Oncology

## 2016-04-05 ENCOUNTER — Encounter: Payer: Self-pay | Admitting: Oncology

## 2016-04-05 ENCOUNTER — Other Ambulatory Visit (HOSPITAL_BASED_OUTPATIENT_CLINIC_OR_DEPARTMENT_OTHER): Payer: 59

## 2016-04-05 VITALS — BP 122/62 | HR 71 | Temp 97.5°F | Resp 16 | Wt 301.0 lb

## 2016-04-05 DIAGNOSIS — G4730 Sleep apnea, unspecified: Secondary | ICD-10-CM | POA: Insufficient documentation

## 2016-04-05 DIAGNOSIS — D45 Polycythemia vera: Secondary | ICD-10-CM

## 2016-04-05 DIAGNOSIS — F32A Depression, unspecified: Secondary | ICD-10-CM | POA: Insufficient documentation

## 2016-04-05 DIAGNOSIS — F329 Major depressive disorder, single episode, unspecified: Secondary | ICD-10-CM | POA: Insufficient documentation

## 2016-04-05 DIAGNOSIS — N200 Calculus of kidney: Secondary | ICD-10-CM | POA: Insufficient documentation

## 2016-04-05 DIAGNOSIS — D473 Essential (hemorrhagic) thrombocythemia: Secondary | ICD-10-CM

## 2016-04-05 DIAGNOSIS — G4733 Obstructive sleep apnea (adult) (pediatric): Secondary | ICD-10-CM | POA: Insufficient documentation

## 2016-04-05 DIAGNOSIS — M1990 Unspecified osteoarthritis, unspecified site: Secondary | ICD-10-CM | POA: Insufficient documentation

## 2016-04-05 LAB — COMPREHENSIVE METABOLIC PANEL
ALT: 40 U/L (ref 0–55)
AST: 34 U/L (ref 5–34)
Albumin: 3.4 g/dL — ABNORMAL LOW (ref 3.5–5.0)
Alkaline Phosphatase: 91 U/L (ref 40–150)
Anion Gap: 10 mEq/L (ref 3–11)
BUN: 14.7 mg/dL (ref 7.0–26.0)
CO2: 20 mEq/L — ABNORMAL LOW (ref 22–29)
Calcium: 9 mg/dL (ref 8.4–10.4)
Chloride: 111 mEq/L — ABNORMAL HIGH (ref 98–109)
Creatinine: 1.1 mg/dL (ref 0.7–1.3)
EGFR: 69 mL/min/{1.73_m2} — ABNORMAL LOW (ref >90)
Glucose: 155 mg/dl — ABNORMAL HIGH (ref 70–140)
Potassium: 4.2 mEq/L (ref 3.5–5.1)
Sodium: 140 mEq/L (ref 136–145)
Total Bilirubin: 0.48 mg/dL (ref 0.20–1.20)
Total Protein: 7.2 g/dL (ref 6.4–8.3)

## 2016-04-05 LAB — CBC WITH DIFFERENTIAL (CANCER CENTER ONLY)
BASO#: 0.1 10*3/uL (ref 0.0–0.2)
BASO%: 1.4 % (ref 0.0–2.0)
EOS%: 2.8 % (ref 0.0–7.0)
Eosinophils Absolute: 0.2 10*3/uL (ref 0.0–0.5)
HCT: 42.5 % (ref 38.7–49.9)
HGB: 13.4 g/dL (ref 13.0–17.1)
LYMPH#: 1.7 10*3/uL (ref 0.9–3.3)
LYMPH%: 20.4 % (ref 14.0–48.0)
MCH: 29 pg (ref 28.0–33.4)
MCHC: 31.5 g/dL — ABNORMAL LOW (ref 32.0–35.9)
MCV: 92 fL (ref 82–98)
MONO#: 0.8 10*3/uL (ref 0.1–0.9)
MONO%: 10 % (ref 0.0–13.0)
NEUT#: 5.5 10*3/uL (ref 1.5–6.5)
NEUT%: 65.4 % (ref 40.0–80.0)
Platelets: 402 10*3/uL — ABNORMAL HIGH (ref 145–400)
RBC: 4.62 10*6/uL (ref 4.20–5.70)
RDW: 16.6 % — ABNORMAL HIGH (ref 11.1–15.7)
WBC: 8.4 10*3/uL (ref 4.0–10.0)

## 2016-04-05 LAB — IRON AND TIBC
%SAT: 6 % — ABNORMAL LOW (ref 20–55)
Iron: 25 ug/dL — ABNORMAL LOW (ref 42–163)
TIBC: 421 ug/dL — ABNORMAL HIGH (ref 202–409)
UIBC: 396 ug/dL — ABNORMAL HIGH (ref 117–376)

## 2016-04-05 LAB — FERRITIN: Ferritin: 15 ng/ml — ABNORMAL LOW (ref 22–316)

## 2016-04-05 MED FILL — HYDROXYUREA 500 MG CAPSULE: 500 | 30 days supply | Qty: 90 | Fill #2 | Status: TO

## 2016-04-05 MED FILL — OMEPRAZOLE DR 20 MG CAPSULE: 20 | 30 days supply | Qty: 60 | Fill #5

## 2016-04-05 NOTE — Progress Notes (Signed)
Hematology and Oncology Follow Up Visit  William Villanueva [id number] 24-Feb-1951 65 y.o. 04/05/2016   Principle Diagnosis:   Polycythemia vera-JAK2 positive  Heart block-Mobitz II  Current Therapy:    Hydrea 541m po BID, alternate with 500 mg a day.  Aspirin 81 mg by mouth daily  Phlebotomy to maintain hematocrit below 45%     Interim History:  [name] back for followup. He is doing okay. He had both the carpal tunnel surgery in the sinus surgery already. He did well with these. The sinus surgery took quite a bit of time. He was in the operating room a lot of the day. He still has some discomfort.  He had trouble tunnel surgery on his left hand. This is probably about 2 months ago. He did well with this. He still has some discomfort in the left wrist.  He is doing well with the polycythemia. He has not been phlebotomized for quite a while. There is had no heart issues. His pacemaker is working well.  He does have some blood sugar issues. His blood sugars are not yet back.  He's had no fever. He's had no rashes. He's had no cough or shortness of breath. He's had no change in bowel or bladder habits.  Overall, his performance status is ECOG 0..   Medications:  Current Outpatient Prescriptions:  .  aspirin 81 MG tablet, Take 81 mg by mouth daily., Disp: , Rfl:  .  carvedilol (COREG) 12.5 MG tablet, TAKE 1 TABLET (12.5 MG TOTAL) BY MOUTH 2 (TWO) TIMES DAILY WITH A MEAL., Disp: 60 tablet, Rfl: 6 .  citalopram (CELEXA) 10 MG tablet, Take 10 mg by mouth daily. , Disp: , Rfl:  .  docusate sodium (STOOL SOFTENER) 100 MG capsule, Take 100 mg by mouth., Disp: , Rfl:  .  Elastic Bandages & Supports (WRIST SPLINT/COCK-UP/RIGHT M) MISC, 1 each by Does not apply route once., Disp: 1 each, Rfl: 0 .  furosemide (LASIX) 20 MG tablet, Take 20 mg by mouth daily. , Disp: , Rfl:  .  hydroxyurea (HYDREA) 500 MG capsule, Take 500 mg by mouth. Take one tablet by mouth every other day and two  tablets by mouth the other days., Disp: , Rfl:  .  omeprazole (PRILOSEC) 20 MG capsule, Take 1 capsule by mouth daily., Disp: , Rfl:  .  polyvinyl alcohol (LIQUIFILM TEARS) 1.4 % ophthalmic solution, Place 1-2 drops into both eyes as needed for dry eyes., Disp: , Rfl:  .  sacubitril-valsartan (ENTRESTO) 24-26 MG, Take 1 tablet by mouth 2 (two) times daily., Disp: 60 tablet, Rfl: 12 .  silodosin (RAPAFLO) 8 MG CAPS capsule, Take 8 mg by mouth. , Disp: , Rfl:  .  spironolactone (ALDACTONE) 25 MG tablet, TAKE 1/2 TABLET BY MOUTH DAILY, Disp: 45 tablet, Rfl: 0 .  ULORIC 40 MG tablet, TAKE 1 TABLET BY MOUTH DAILY., Disp: 30 tablet, Rfl: 6 No current facility-administered medications for this visit.   Facility-Administered Medications Ordered in Other Visits:  .  0.9 %  sodium chloride infusion, , Intravenous, Continuous, PVolanda Napoleon MD, Stopped at 05/16/13 1115  Allergies:  Allergies  Allergen Reactions  . Prednisone Itching and Other (See Comments)    Abdominal pain  . Prozac [Fluoxetine Hcl] Itching  . Temazepam Other (See Comments)    Dizziness   . Trazodone And Nefazodone Other (See Comments)    Dizziness  . Ibuprofen Itching  . Wellbutrin [Bupropion] Hives    Past Medical History,  Surgical history, Social history, and Family History were reviewed and updated.  Review of Systems: As above  Physical Exam:  weight is 301 lb (136.5 kg) (abnormal). His oral temperature is 97.5 F (36.4 C). His blood pressure is 122/62 and his pulse is 71. His respiration is 16.   Obese white male in no obvious distress. There is no adenopathy in the neck. Lungs are clear. Cardiac exam regular rate and rhythm. He has an occasional extra beat. He has no murmurs, rubs or bruits. Abdomen is obese but soft. Has good bowel sounds. There is no fluid wave. There is a palpable liver or spleen tip. Back exam shows a well healed laminectomy scar in the lumbar spine. Extremities shows no clubbing cyanosis or  edema. He has good range of motion of his joints. He has good strength. Skin exam no rashes, ecchymoses or petechia. Neurological exam is nonfocal.  Lab Results  Component Value Date   WBC 8.4 04/05/2016   HGB 13.4 04/05/2016   HCT 42.5 04/05/2016   MCV 92 04/05/2016   PLT 402 (H) 04/05/2016     Chemistry      Component Value Date/Time   NA 136 02/14/2016 1128   NA 139 02/01/2016 1022   K 4.4 02/14/2016 1128   K 3.9 02/01/2016 1022   CL 105 02/14/2016 1128   CL 103 10/29/2014 0849   CO2 18 (L) 02/14/2016 1128   CO2 20 (L) 02/01/2016 1022   BUN 22 02/14/2016 1128   BUN 16.6 02/01/2016 1022   CREATININE 1.35 (H) 02/14/2016 1128   CREATININE 1.3 02/01/2016 1022      Component Value Date/Time   CALCIUM 8.8 02/14/2016 1128   CALCIUM 8.7 02/01/2016 1022   ALKPHOS 78 02/01/2016 1022   AST 24 02/01/2016 1022   ALT 29 02/01/2016 1022   BILITOT 0.78 02/01/2016 1022         Impression and Plan: William Villanueva is 65 year old gentleman with polycythemia vera.   He got through his surgeries. He did okay. He had no complications as far as I can tell.  He does not need to be phlebotomized.  I think we can probably get him back in 3 months. We'll try to get him through the holidays and through most of the wintertime.    Volanda Napoleon, MD 11/29/20178:36 AM

## 2016-04-28 ENCOUNTER — Other Ambulatory Visit: Payer: Self-pay | Admitting: Cardiology

## 2016-04-28 DIAGNOSIS — R0602 Shortness of breath: Secondary | ICD-10-CM

## 2016-04-28 MED FILL — CARVEDILOL 12.5 MG TABLET: 12.5 | 30 days supply | Qty: 60 | Fill #2 | Status: TO

## 2016-04-28 MED FILL — CITALOPRAM HBR 10 MG TABLET: 10 | 32 days supply | Qty: 16 | Fill #3

## 2016-04-28 MED FILL — FUROSEMIDE 20 MG TABLET: 20 | 90 days supply | Qty: 90 | Fill #0 | Status: TO

## 2016-04-28 MED FILL — OMEPRAZOLE DR 20 MG CAPSULE: 20 | 30 days supply | Qty: 60 | Fill #0 | Status: TO

## 2016-04-28 MED FILL — RAPAFLO 8 MG CAPSULE: 8 | 90 days supply | Qty: 90 | Fill #1 | Status: TO

## 2016-04-28 MED FILL — ULORIC 40 MG TABLET: 40 | 30 days supply | Qty: 30 | Fill #1

## 2016-05-15 MED FILL — CEPHALEXIN 500 MG CAPSULE: 500 | 10 days supply | Qty: 30 | Fill #0

## 2016-05-26 ENCOUNTER — Emergency Department (HOSPITAL_BASED_OUTPATIENT_CLINIC_OR_DEPARTMENT_OTHER): Payer: 59

## 2016-05-26 ENCOUNTER — Encounter (HOSPITAL_BASED_OUTPATIENT_CLINIC_OR_DEPARTMENT_OTHER): Payer: Self-pay | Admitting: Emergency Medicine

## 2016-05-26 ENCOUNTER — Inpatient Hospital Stay (HOSPITAL_BASED_OUTPATIENT_CLINIC_OR_DEPARTMENT_OTHER)
Admission: EM | Admit: 2016-05-26 | Discharge: 2016-05-29 | DRG: 372 | Disposition: A | Discharge status: Home / Self Care | Payer: 59 | Attending: Family Medicine | Admitting: Family Medicine

## 2016-05-26 DIAGNOSIS — I110 Hypertensive heart disease with heart failure: Secondary | ICD-10-CM | POA: Diagnosis present

## 2016-05-26 DIAGNOSIS — Z87442 Personal history of urinary calculi: Secondary | ICD-10-CM

## 2016-05-26 DIAGNOSIS — Q549 Hypospadias, unspecified: Secondary | ICD-10-CM

## 2016-05-26 DIAGNOSIS — Z7982 Long term (current) use of aspirin: Secondary | ICD-10-CM

## 2016-05-26 DIAGNOSIS — E872 Acidosis: Secondary | ICD-10-CM | POA: Diagnosis present

## 2016-05-26 DIAGNOSIS — I5042 Chronic combined systolic (congestive) and diastolic (congestive) heart failure: Secondary | ICD-10-CM | POA: Diagnosis present

## 2016-05-26 DIAGNOSIS — E669 Obesity, unspecified: Secondary | ICD-10-CM | POA: Diagnosis present

## 2016-05-26 DIAGNOSIS — F329 Major depressive disorder, single episode, unspecified: Secondary | ICD-10-CM | POA: Diagnosis present

## 2016-05-26 DIAGNOSIS — M109 Gout, unspecified: Secondary | ICD-10-CM | POA: Diagnosis present

## 2016-05-26 DIAGNOSIS — I420 Dilated cardiomyopathy: Secondary | ICD-10-CM | POA: Diagnosis present

## 2016-05-26 DIAGNOSIS — I959 Hypotension, unspecified: Secondary | ICD-10-CM | POA: Diagnosis present

## 2016-05-26 DIAGNOSIS — Z6841 Body Mass Index (BMI) 40.0 and over, adult: Secondary | ICD-10-CM

## 2016-05-26 DIAGNOSIS — Z823 Family history of stroke: Secondary | ICD-10-CM

## 2016-05-26 DIAGNOSIS — E860 Dehydration: Secondary | ICD-10-CM | POA: Diagnosis not present

## 2016-05-26 DIAGNOSIS — Z950 Presence of cardiac pacemaker: Secondary | ICD-10-CM

## 2016-05-26 DIAGNOSIS — Z8249 Family history of ischemic heart disease and other diseases of the circulatory system: Secondary | ICD-10-CM

## 2016-05-26 DIAGNOSIS — K219 Gastro-esophageal reflux disease without esophagitis: Secondary | ICD-10-CM | POA: Diagnosis present

## 2016-05-26 DIAGNOSIS — A0472 Enterocolitis due to Clostridium difficile, not specified as recurrent: Secondary | ICD-10-CM | POA: Diagnosis not present

## 2016-05-26 DIAGNOSIS — Z87891 Personal history of nicotine dependence: Secondary | ICD-10-CM

## 2016-05-26 DIAGNOSIS — Z79899 Other long term (current) drug therapy: Secondary | ICD-10-CM

## 2016-05-26 DIAGNOSIS — E876 Hypokalemia: Secondary | ICD-10-CM | POA: Diagnosis present

## 2016-05-26 DIAGNOSIS — N179 Acute kidney failure, unspecified: Secondary | ICD-10-CM | POA: Diagnosis present

## 2016-05-26 DIAGNOSIS — G5603 Carpal tunnel syndrome, bilateral upper limbs: Secondary | ICD-10-CM | POA: Diagnosis present

## 2016-05-26 DIAGNOSIS — Z833 Family history of diabetes mellitus: Secondary | ICD-10-CM

## 2016-05-26 DIAGNOSIS — N400 Enlarged prostate without lower urinary tract symptoms: Secondary | ICD-10-CM | POA: Diagnosis present

## 2016-05-26 DIAGNOSIS — D45 Polycythemia vera: Secondary | ICD-10-CM | POA: Diagnosis present

## 2016-05-26 DIAGNOSIS — I10 Essential (primary) hypertension: Secondary | ICD-10-CM | POA: Diagnosis present

## 2016-05-26 DIAGNOSIS — Z888 Allergy status to other drugs, medicaments and biological substances status: Secondary | ICD-10-CM

## 2016-05-26 DIAGNOSIS — Z886 Allergy status to analgesic agent status: Secondary | ICD-10-CM

## 2016-05-26 DIAGNOSIS — R748 Abnormal levels of other serum enzymes: Secondary | ICD-10-CM | POA: Diagnosis present

## 2016-05-26 DIAGNOSIS — R197 Diarrhea, unspecified: Secondary | ICD-10-CM

## 2016-05-26 DIAGNOSIS — R202 Paresthesia of skin: Secondary | ICD-10-CM | POA: Diagnosis present

## 2016-05-26 DIAGNOSIS — E871 Hypo-osmolality and hyponatremia: Secondary | ICD-10-CM | POA: Diagnosis present

## 2016-05-26 LAB — COMPREHENSIVE METABOLIC PANEL
ALT: 46 U/L (ref 17–63)
AST: 49 U/L — ABNORMAL HIGH (ref 15–41)
Albumin: 3.9 g/dL (ref 3.5–5.0)
Alkaline Phosphatase: 75 U/L (ref 38–126)
Anion gap: 12 (ref 5–15)
BUN: 38 mg/dL — ABNORMAL HIGH (ref 6–20)
CO2: 18 mmol/L — ABNORMAL LOW (ref 22–32)
Calcium: 7.9 mg/dL — ABNORMAL LOW (ref 8.9–10.3)
Chloride: 104 mmol/L (ref 101–111)
Creatinine, Ser: 3.05 mg/dL — ABNORMAL HIGH (ref 0.61–1.24)
GFR calc Af Amer: 23 mL/min — ABNORMAL LOW (ref >60)
GFR calc non Af Amer: 20 mL/min — ABNORMAL LOW (ref >60)
Glucose, Bld: 146 mg/dL — ABNORMAL HIGH (ref 65–99)
Potassium: 3.4 mmol/L — ABNORMAL LOW (ref 3.5–5.1)
Sodium: 134 mmol/L — ABNORMAL LOW (ref 135–145)
Total Bilirubin: 0.5 mg/dL (ref 0.3–1.2)
Total Protein: 7.4 g/dL (ref 6.5–8.1)

## 2016-05-26 LAB — URINALYSIS, ROUTINE W REFLEX MICROSCOPIC
Glucose, UA: NEGATIVE mg/dL
Hgb urine dipstick: NEGATIVE
Ketones, ur: NEGATIVE mg/dL
Leukocytes, UA: NEGATIVE
Nitrite: NEGATIVE
Protein, ur: NEGATIVE mg/dL
Specific Gravity, Urine: 1.022 (ref 1.005–1.030)
pH: 5.5 (ref 5.0–8.0)

## 2016-05-26 LAB — CBC WITH DIFFERENTIAL/PLATELET
Basophils Absolute: 0.1 10*3/uL (ref 0.0–0.1)
Basophils Relative: 1 %
Eosinophils Absolute: 0.2 10*3/uL (ref 0.0–0.7)
Eosinophils Relative: 1 %
HCT: 43.3 % (ref 39.0–52.0)
Hemoglobin: 13.5 g/dL (ref 13.0–17.0)
Lymphocytes Relative: 14 %
Lymphs Abs: 1.6 10*3/uL (ref 0.7–4.0)
MCH: 28.4 pg (ref 26.0–34.0)
MCHC: 31.2 g/dL (ref 30.0–36.0)
MCV: 91.2 fL (ref 78.0–100.0)
Monocytes Absolute: 1.5 10*3/uL — ABNORMAL HIGH (ref 0.1–1.0)
Monocytes Relative: 12 %
Neutro Abs: 8.5 10*3/uL — ABNORMAL HIGH (ref 1.7–7.7)
Neutrophils Relative %: 72 %
Platelets: 455 10*3/uL — ABNORMAL HIGH (ref 150–400)
RBC: 4.75 MIL/uL (ref 4.22–5.81)
RDW: 15.6 % — ABNORMAL HIGH (ref 11.5–15.5)
WBC: 11.9 10*3/uL — ABNORMAL HIGH (ref 4.0–10.5)

## 2016-05-26 LAB — LIPASE, BLOOD: Lipase: 25 U/L (ref 11–51)

## 2016-05-26 LAB — TROPONIN I: Troponin I: 0.03 ng/mL (ref <0.03)

## 2016-05-26 LAB — I-STAT CG4 LACTIC ACID, ED: Lactic Acid, Venous: 2 mmol/L (ref 0.5–1.9)

## 2016-05-26 MED ORDER — SODIUM CHLORIDE 0.9 % IV SOLN: Freq: Once | INTRAVENOUS | Status: DC

## 2016-05-26 MED ORDER — ACETAMINOPHEN 500 MG PO TABS
1000.0000 mg | ORAL_TABLET | Freq: Once | ORAL | Status: AC
  Administered 2016-05-26: 1000 mg via ORAL
  Filled 2016-05-26: qty 2

## 2016-05-26 MED ORDER — SODIUM CHLORIDE 0.9 % IV BOLUS (SEPSIS)
1000.0000 mL | Freq: Once | INTRAVENOUS | Status: AC
  Administered 2016-05-26: 1000 mL via INTRAVENOUS

## 2016-05-26 NOTE — ED Notes (Signed)
Diarrhea since wednesday w some abd cramping at times no n/v at present

## 2016-05-26 NOTE — ED Notes (Signed)
Pt now c/o head and hip pain

## 2016-05-26 NOTE — ED Triage Notes (Signed)
Pt c/o NVD since Wed and weakness; hypotensive in triage

## 2016-05-26 NOTE — Progress Notes (Signed)
66 yo M with CHF EF 50%, HTN presents with few days profuse diarrhea.  Initially, held BP meds and Lasix for 2 days, but then restarted.  Now dizzy, lightheaded, pre-syncopal.  BP 100/65   Pulse 73   Temp 97.5 F (36.4 C) (Oral)   Resp 19   Ht 5' 10"  (1.778 m)   Wt 134.3 kg (296 lb)   SpO2 96%   BMI 42.47 kg/m   Na134, K 3.4, Cr 3.0 (baseline 1.1) WBC 11.9, Hgb 13.5 Trop 0.03 Lact 2.0  Got 2L there, responding to fluids.  We will transfer to tele, OBS status for dehydration.

## 2016-05-26 NOTE — ED Provider Notes (Signed)
[facility] DEPT [facility] Provider Note   CSN: [id number] Arrival date & time: 05/26/16  1928   By signing my name below, I, Dolores Hoose, attest that this documentation has been prepared under the direction and in the presence of Forde Dandy, MD . Electronically Signed: Dolores Hoose, Scribe. 05/26/2016. 8:10 PM.  History   Chief Complaint Chief Complaint  Patient presents with  . Diarrhea   The history is provided by the patient. No language interpreter was used.    HPI Comments:  William Villanueva is a 66 y.o. 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 Pt reports associated vomiting, cough, nausea, and dizziness exacerbated by standing. His vomiting was worst 2 days ago and has since resolved. He also notes an episode last night of near-syncope where he became very dizzy and fell to the floor. Pt denies any fever, hematemesis, congestion, sore throat, LE swelling or abdominal pain. His girlfriend has recently been sick with a similar GI bug. Pt has a pacemaker for history of heart block. Initial held his lasix and BP medications, but started taking these medications again over past day.  Past Medical History:  Diagnosis Date  . AV block, 2nd degree 2015   St. Jude Allure Quadra pulse generator [id number], model PM 3242  . Back pain   . Cardiomyopathy ([place])    Nonischemic 45%.   . CHF (congestive heart failure) ([place])   . Gout   . Hemochromatosis   . Hypertension   . Hypospadias [birth year]   born with  . Nephrolithiasis   . Polycythemia vera(238.4)     Patient Active Problem List   Diagnosis Date Noted  . AKI (acute kidney injury) ([place]) 05/26/2016  . CHF (congestive heart failure) ([place]) 04/05/2016  . Depression 04/05/2016  . Kidney stones 04/05/2016  . Osteoarthritis 04/05/2016  . Sleep apnea 04/05/2016  . Bilateral carpal tunnel syndrome 12/02/2015    . Thrombocythemia ([place]) 07/15/2015  . Chronic combined systolic and diastolic congestive heart failure ([place]) 04/13/2015  . Shortness of breath   . Acute diastolic CHF (congestive heart failure), NYHA class 4 ([place]) 01/19/2015  . Orthostatic hypotension 01/19/2015  . Acute diastolic CHF (congestive heart failure) ([place]) 01/19/2015  . Obesity 11/26/2014  . Enlarged prostate without lower urinary tract symptoms (luts) 10/29/2014  . Essential (primary) hypertension 10/29/2014  . Gastro-esophageal reflux disease without esophagitis 10/29/2014  . Panic attack 10/29/2014  . Changing skin lesion 10/16/2014  . Acute on chronic combined systolic and diastolic congestive heart failure ([place]) 10/14/2014  . Renal cyst 09/02/2014  . Pacemaker lead malfunction-elevated threshold RV lead 07/31/2014  . Dyspnea 06/21/2014  . Noninfective gastroenteritis and colitis 06/15/2014  . 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 Palpitations 06/11/2014  . 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  . Bradycardia 04/28/2014  . Chronic pain 02/12/2014  . 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 Lumbar and sacral osteoarthritis 05/19/2013  . Congestive dilated cardiomyopathy ([place]) 01/11/2012  . Cardiomyopathy ([place]) 01/11/2012  . Primary cardiomyopathy ([place]) 01/11/2012  . Compulsive tobacco user syndrome 12/13/2011  . Current tobacco use 12/13/2011  . Chest tightness   . Rash   . Back pain   . Hemochromatosis   . SOB (shortness of breath)   . Gout   . Polycythemia vera ([place]) 03/27/2011    Past Surgical History:  Procedure Laterality Date  . ANKLE SURGERY    . BI-VENTRICULAR PACEMAKER INSERTION N/A  04/29/2014   Procedure: BI-VENTRICULAR PACEMAKER INSERTION (CRT-P);  Surgeon: Deboraha Sprang, MD; Laterality: Left  St. Jude Allure Quadra pulse generator [id number], model PM [phone]  . PILONIDAL CYST EXCISION    . POSTERIOR LAMINECTOMY / DECOMPRESSION LUMBAR SPINE    . TONSILLECTOMY         Home Medications     Prior to Admission medications   Medication Sig Start Date End Date Taking? Authorizing Provider  aspirin 81 MG tablet Take 81 mg by mouth daily.    Historical Provider, MD  carvedilol (COREG) 12.5 MG tablet TAKE 1 TABLET (12.5 MG TOTAL) BY MOUTH 2 (TWO) TIMES DAILY WITH A MEAL. 02/24/16   Lelon Perla, MD  citalopram (CELEXA) 10 MG tablet Take 10 mg by mouth daily.  01/27/16   Historical Provider, MD  docusate sodium (STOOL SOFTENER) 100 MG capsule Take 100 mg by mouth.    Historical Provider, MD  Elastic Bandages & Supports (WRIST SPLINT/COCK-UP/RIGHT M) MISC 1 each by Does not apply route once. 10/19/15   Donika Keith Rake, DO  furosemide (LASIX) 20 MG tablet Take 20 mg by mouth daily.  01/12/16   Historical Provider, MD  furosemide (LASIX) 20 MG tablet Take 1 tablet (20 mg total) by mouth daily. 04/28/16   Lelon Perla, MD  hydroxyurea (HYDREA) 500 MG capsule Take 500 mg by mouth. Take one tablet by mouth every other day and two tablets by mouth the other days.    Historical Provider, MD  omeprazole (PRILOSEC) 20 MG capsule Take 1 capsule by mouth daily. 04/09/15   Historical Provider, MD  polyvinyl alcohol (LIQUIFILM TEARS) 1.4 % ophthalmic solution Place 1-2 drops into both eyes as needed for dry eyes.    Historical Provider, MD  sacubitril-valsartan (ENTRESTO) 24-26 MG Take 1 tablet by mouth 2 (two) times daily. 02/02/16   Lelon Perla, MD  silodosin (RAPAFLO) 8 MG CAPS capsule Take 8 mg by mouth.  03/24/15   Historical Provider, MD  spironolactone (ALDACTONE) 25 MG tablet TAKE 1/2 TABLET BY MOUTH DAILY 02/24/16   Lelon Perla, MD  ULORIC 40 MG tablet TAKE 1 TABLET BY MOUTH DAILY. 07/26/15   Volanda Napoleon, MD    Family History Family History  Problem Relation Age of Onset  . Heart disease Maternal Grandmother     Pacemaker, MI  . Stroke Mother   . Other Father     Deceased, car fell on him  . Diabetes Sister   . Hypertension Sister     Social History Social History   Substance Use Topics  . Smoking status: Former Smoker    Packs/day: 1.00    Years: 44.00    Types: Cigarettes    Start date: 06/05/1968    Quit date: 04/05/2013  . Smokeless tobacco: Never Used     Comment: quit 2 years ago  . Alcohol use 0.0 oz/week     Comment: Rare - once per month     Allergies   Prednisone; Prozac [fluoxetine hcl]; Temazepam; Trazodone and nefazodone; Ibuprofen; and Wellbutrin [bupropion]   Review of Systems Review of Systems 10/14 Systems reviewed and are negative for acute change except as noted in the HPI.   Physical Exam Updated Vital Signs BP 105/57 (BP Location: Left Arm)   Pulse 74   Temp 97.5 F (36.4 C) (Oral)   Resp 16   Ht 5' 10"  (1.778 m)   Wt 296 lb (134.3 kg)   SpO2 98%   BMI 42.47  kg/m   Physical Exam Physical Exam  Nursing note and vitals reviewed. Constitutional: Listless, appears fatigued, non-toxic, and in no acute distress Head: Normocephalic and atraumatic.  Mouth/Throat: Oropharynx is clear  Dry mucous membranes.  Neck: Normal range of motion. Neck supple.  Cardiovascular: Normal rate and regular rhythm.  no edema Pulmonary/Chest: Effort normal and breath sounds normal.  Abdominal: Soft. There is no tenderness. There is no rebound and no guarding.  Musculoskeletal: Normal range of motion.  Neurological: Alert, no facial droop, fluent speech, moves all extremities symmetrically Skin: Skin is warm and dry.  Psychiatric: Cooperative   ED Treatments / Results  DIAGNOSTIC STUDIES:  Oxygen Saturation is 98% on RA, normal by my interpretation.    COORDINATION OF CARE:  8:25 PM Discussed treatment plan with pt at bedside which includes blood work and fluids and pt agreed to plan.  Labs (all labs ordered are listed, but only abnormal results are displayed) Labs Reviewed  CBC WITH DIFFERENTIAL/PLATELET - Abnormal; Notable for the following:       Result Value   WBC 11.9 (*)    RDW 15.6 (*)    Platelets 455 (*)     Neutro Abs 8.5 (*)    Monocytes Absolute 1.5 (*)    All other components within normal limits  COMPREHENSIVE METABOLIC PANEL - Abnormal; Notable for the following:    Sodium 134 (*)    Potassium 3.4 (*)    CO2 18 (*)    Glucose, Bld 146 (*)    BUN 38 (*)    Creatinine, Ser 3.05 (*)    Calcium 7.9 (*)    AST 49 (*)    GFR calc non Af Amer 20 (*)    GFR calc Af Amer 23 (*)    All other components within normal limits  URINALYSIS, ROUTINE W REFLEX MICROSCOPIC - Abnormal; Notable for the following:    Bilirubin Urine SMALL (*)    All other components within normal limits  TROPONIN I - Abnormal; Notable for the following:    Troponin I 0.03 (*)    All other components within normal limits  I-STAT CG4 LACTIC ACID, ED - Abnormal; Notable for the following:    Lactic Acid, Venous 2.00 (*)    All other components within normal limits  LIPASE, BLOOD  I-STAT CG4 LACTIC ACID, ED    EKG  EKG Interpretation  Date/Time:  Friday May 26 2016 20:47:39 EST Ventricular Rate:  75 PR Interval:    QRS Duration: 131 QT Interval:  440 QTC Calculation: 492 R Axis:   -95 Text Interpretation:  Atrial-sensed ventricular-paced rhythm No further analysis attempted due to paced rhythm Baseline wander in lead(s) V4 similar to prior EKG Confirmed by Graylin Sperling MD, Lashan Gluth [phone]) on 05/26/2016 8:55:17 PM       Radiology Dg Chest 2 View  Result Date: 05/26/2016 CLINICAL DATA:  Nausea vomiting and diarrhea for 3 days EXAM: CHEST  2 VIEW COMPARISON:  01/19/2015 FINDINGS: Left-sided multi lead pacing device is similar compared to prior. There is borderline to mild cardiomegaly without overt failure. Prominent interstitial opacities in the lung bases likely chronic. No consolidation. No effusion. No pneumothorax. Degenerative changes of the spine. IMPRESSION: 1. Borderline to mild cardiomegaly without overt failure. No acute infiltrate. Electronically Signed   By: Donavan Foil M.D.   On: 05/26/2016 22:08     Procedures Procedures (including critical care time)  Medications Ordered in ED Medications  sodium chloride 0.9 % bolus 1,000 mL (0 mLs  Intravenous Stopped 05/26/16 2209)  sodium chloride 0.9 % bolus 1,000 mL (1,000 mLs Intravenous New Bag/Given 05/26/16 2221)  acetaminophen (TYLENOL) tablet 1,000 mg (1,000 mg Oral Given 05/26/16 2257)     Initial Impression / Assessment and Plan / ED Course  I have reviewed the triage vital signs and the nursing notes.  Pertinent labs & imaging results that were available during my care of the patient were reviewed by me and considered in my medical decision making (see chart for details).    66 year old male with history of nonischemic cardiomyopathy, pacemaker who presents with nausea, vomiting, diarrhea. He was hypotensive in triage was soft systolic blood pressures in the 80s. Afebrile. Appears very dry and dehydrated on exam. Abdomen soft, nontender, nonsurgical.  Blood work notable for elevated lactate of 2.0 due to dehydration as well as acute kidney injury with a creatinine of 3.0. White creatinine of 1.3. With mild hypokalemia and hyponatremia. Presentation overall concerning for dehydration. Minimal leukocytosis, and no concerns at this time for acute surgical or emergent intraabdominal process.   Given 2L of IV, with soft BP 90-100s. Discussed with Dr. Loleta Books. Will admit for observation for IVF.    Final Clinical Impressions(s) / ED Diagnoses   Final diagnoses:  Dehydration  AKI (acute kidney injury) ([place])  Diarrhea, unspecified type  Hypotension, unspecified hypotension type    New Prescriptions New Prescriptions   No medications on file   I personally performed the services described in this documentation, which was scribed in my presence. The recorded information has been reviewed and is accurate.     Forde Dandy, MD 05/26/16 [phone]

## 2016-05-27 ENCOUNTER — Encounter (HOSPITAL_COMMUNITY): Payer: Self-pay | Admitting: Family Medicine

## 2016-05-27 DIAGNOSIS — I110 Hypertensive heart disease with heart failure: Secondary | ICD-10-CM | POA: Diagnosis present

## 2016-05-27 DIAGNOSIS — I959 Hypotension, unspecified: Secondary | ICD-10-CM | POA: Diagnosis present

## 2016-05-27 DIAGNOSIS — R748 Abnormal levels of other serum enzymes: Secondary | ICD-10-CM | POA: Diagnosis present

## 2016-05-27 DIAGNOSIS — E876 Hypokalemia: Secondary | ICD-10-CM | POA: Diagnosis present

## 2016-05-27 DIAGNOSIS — I5042 Chronic combined systolic (congestive) and diastolic (congestive) heart failure: Secondary | ICD-10-CM | POA: Diagnosis present

## 2016-05-27 DIAGNOSIS — R202 Paresthesia of skin: Secondary | ICD-10-CM | POA: Diagnosis present

## 2016-05-27 DIAGNOSIS — Z950 Presence of cardiac pacemaker: Secondary | ICD-10-CM | POA: Diagnosis not present

## 2016-05-27 DIAGNOSIS — M109 Gout, unspecified: Secondary | ICD-10-CM | POA: Diagnosis present

## 2016-05-27 DIAGNOSIS — I420 Dilated cardiomyopathy: Secondary | ICD-10-CM | POA: Diagnosis present

## 2016-05-27 DIAGNOSIS — N179 Acute kidney failure, unspecified: Secondary | ICD-10-CM | POA: Diagnosis present

## 2016-05-27 DIAGNOSIS — F329 Major depressive disorder, single episode, unspecified: Secondary | ICD-10-CM | POA: Diagnosis present

## 2016-05-27 DIAGNOSIS — D45 Polycythemia vera: Secondary | ICD-10-CM | POA: Diagnosis present

## 2016-05-27 DIAGNOSIS — E872 Acidosis: Secondary | ICD-10-CM | POA: Diagnosis present

## 2016-05-27 DIAGNOSIS — Z6841 Body Mass Index (BMI) 40.0 and over, adult: Secondary | ICD-10-CM | POA: Diagnosis not present

## 2016-05-27 DIAGNOSIS — E871 Hypo-osmolality and hyponatremia: Secondary | ICD-10-CM | POA: Diagnosis present

## 2016-05-27 DIAGNOSIS — I10 Essential (primary) hypertension: Secondary | ICD-10-CM

## 2016-05-27 DIAGNOSIS — A0472 Enterocolitis due to Clostridium difficile, not specified as recurrent: Secondary | ICD-10-CM | POA: Diagnosis present

## 2016-05-27 DIAGNOSIS — K219 Gastro-esophageal reflux disease without esophagitis: Secondary | ICD-10-CM | POA: Diagnosis present

## 2016-05-27 DIAGNOSIS — Q549 Hypospadias, unspecified: Secondary | ICD-10-CM | POA: Diagnosis not present

## 2016-05-27 DIAGNOSIS — G5603 Carpal tunnel syndrome, bilateral upper limbs: Secondary | ICD-10-CM | POA: Diagnosis present

## 2016-05-27 DIAGNOSIS — N400 Enlarged prostate without lower urinary tract symptoms: Secondary | ICD-10-CM | POA: Diagnosis present

## 2016-05-27 DIAGNOSIS — Z7982 Long term (current) use of aspirin: Secondary | ICD-10-CM | POA: Diagnosis not present

## 2016-05-27 DIAGNOSIS — E669 Obesity, unspecified: Secondary | ICD-10-CM | POA: Diagnosis present

## 2016-05-27 DIAGNOSIS — E860 Dehydration: Secondary | ICD-10-CM | POA: Diagnosis present

## 2016-05-27 DIAGNOSIS — Z87442 Personal history of urinary calculi: Secondary | ICD-10-CM | POA: Diagnosis not present

## 2016-05-27 LAB — GASTROINTESTINAL PANEL BY PCR, STOOL (REPLACES STOOL CULTURE)

## 2016-05-27 LAB — CBC
HCT: 40.4 % (ref 39.0–52.0)
Hemoglobin: 12.5 g/dL — ABNORMAL LOW (ref 13.0–17.0)
MCH: 28.4 pg (ref 26.0–34.0)
MCHC: 30.9 g/dL (ref 30.0–36.0)
MCV: 91.8 fL (ref 78.0–100.0)
Platelets: 330 10*3/uL (ref 150–400)
RBC: 4.4 MIL/uL (ref 4.22–5.81)
RDW: 15.7 % — ABNORMAL HIGH (ref 11.5–15.5)
WBC: 11.7 10*3/uL — ABNORMAL HIGH (ref 4.0–10.5)

## 2016-05-27 LAB — BASIC METABOLIC PANEL
Anion gap: 8 (ref 5–15)
Anion gap: 8 (ref 5–15)
BUN: 35 mg/dL — ABNORMAL HIGH (ref 6–20)
BUN: 31 mg/dL — ABNORMAL HIGH (ref 6–20)
CO2: 17 mmol/L — ABNORMAL LOW (ref 22–32)
CO2: 15 mmol/L — ABNORMAL LOW (ref 22–32)
Calcium: 7.6 mg/dL — ABNORMAL LOW (ref 8.9–10.3)
Calcium: 7.7 mg/dL — ABNORMAL LOW (ref 8.9–10.3)
Chloride: 111 mmol/L (ref 101–111)
Chloride: 113 mmol/L — ABNORMAL HIGH (ref 101–111)
Creatinine, Ser: 2.47 mg/dL — ABNORMAL HIGH (ref 0.61–1.24)
Creatinine, Ser: 1.82 mg/dL — ABNORMAL HIGH (ref 0.61–1.24)
GFR calc Af Amer: 30 mL/min — ABNORMAL LOW (ref >60)
GFR calc Af Amer: 44 mL/min — ABNORMAL LOW (ref >60)
GFR calc non Af Amer: 26 mL/min — ABNORMAL LOW (ref >60)
GFR calc non Af Amer: 38 mL/min — ABNORMAL LOW (ref >60)
Glucose, Bld: 132 mg/dL — ABNORMAL HIGH (ref 65–99)
Glucose, Bld: 110 mg/dL — ABNORMAL HIGH (ref 65–99)
Potassium: 3.1 mmol/L — ABNORMAL LOW (ref 3.5–5.1)
Potassium: 4 mmol/L (ref 3.5–5.1)
Sodium: 136 mmol/L (ref 135–145)
Sodium: 136 mmol/L (ref 135–145)

## 2016-05-27 LAB — C DIFFICILE QUICK SCREEN W PCR REFLEX
C Diff antigen: POSITIVE — AB
C Diff toxin: NEGATIVE

## 2016-05-27 LAB — OSMOLALITY, URINE: Osmolality, Ur: 512 mOsm/kg (ref 300–900)

## 2016-05-27 LAB — OSMOLALITY: Osmolality: 299 mOsm/kg — ABNORMAL HIGH (ref 275–295)

## 2016-05-27 LAB — CLOSTRIDIUM DIFFICILE BY PCR: Toxigenic C. Difficile by PCR: POSITIVE — AB

## 2016-05-27 LAB — SODIUM, URINE, RANDOM: Sodium, Ur: 32 mmol/L

## 2016-05-27 LAB — MAGNESIUM: Magnesium: 1.2 mg/dL — ABNORMAL LOW (ref 1.7–2.4)

## 2016-05-27 LAB — TROPONIN I: Troponin I: 0.03 ng/mL (ref <0.03)

## 2016-05-27 MED ORDER — HYDROXYUREA 500 MG PO CAPS
500.0000 mg | ORAL_CAPSULE | Freq: Every day | ORAL | Status: DC
  Administered 2016-05-27: 500 mg via ORAL
  Filled 2016-05-27: qty 1

## 2016-05-27 MED ORDER — ONDANSETRON HCL 4 MG PO TABS: 4.0000 mg | ORAL_TABLET | Freq: Four times a day (QID) | ORAL | Status: DC | PRN

## 2016-05-27 MED ORDER — METRONIDAZOLE IN NACL 5-0.79 MG/ML-% IV SOLN
500.0000 mg | Freq: Three times a day (TID) | INTRAVENOUS | Status: DC
  Administered 2016-05-27 – 2016-05-29 (×7): 500 mg via INTRAVENOUS
  Filled 2016-05-27 (×7): qty 100

## 2016-05-27 MED ORDER — ONDANSETRON HCL 4 MG/2ML IJ SOLN: 4.0000 mg | Freq: Four times a day (QID) | INTRAMUSCULAR | Status: DC | PRN

## 2016-05-27 MED ORDER — ACETAMINOPHEN 325 MG PO TABS
650.0000 mg | ORAL_TABLET | Freq: Four times a day (QID) | ORAL | Status: DC | PRN
  Administered 2016-05-27: 650 mg via ORAL
  Filled 2016-05-27: qty 2

## 2016-05-27 MED ORDER — SODIUM CHLORIDE 0.9 % IV SOLN: INTRAVENOUS | Status: DC

## 2016-05-27 MED ORDER — ASPIRIN EC 81 MG PO TBEC
81.0000 mg | DELAYED_RELEASE_TABLET | Freq: Every day | ORAL | Status: DC
  Administered 2016-05-27 – 2016-05-29 (×3): 81 mg via ORAL
  Filled 2016-05-27 (×3): qty 1

## 2016-05-27 MED ORDER — POTASSIUM CHLORIDE IN NACL 20-0.9 MEQ/L-% IV SOLN
INTRAVENOUS | Status: AC
  Administered 2016-05-27: 17:00:00 via INTRAVENOUS
  Filled 2016-05-27: qty 1000

## 2016-05-27 MED ORDER — FEBUXOSTAT 40 MG PO TABS
40.0000 mg | ORAL_TABLET | Freq: Every day | ORAL | Status: DC
  Administered 2016-05-27 – 2016-05-29 (×3): 40 mg via ORAL
  Filled 2016-05-27 (×3): qty 1

## 2016-05-27 MED ORDER — SODIUM CHLORIDE 0.9% FLUSH
3.0000 mL | Freq: Two times a day (BID) | INTRAVENOUS | Status: DC
  Administered 2016-05-27 – 2016-05-29 (×6): 3 mL via INTRAVENOUS

## 2016-05-27 MED ORDER — POTASSIUM CHLORIDE CRYS ER 20 MEQ PO TBCR
20.0000 meq | EXTENDED_RELEASE_TABLET | Freq: Two times a day (BID) | ORAL | Status: DC
  Administered 2016-05-27 – 2016-05-29 (×5): 20 meq via ORAL
  Filled 2016-05-27 (×5): qty 1

## 2016-05-27 MED ORDER — TAMSULOSIN HCL 0.4 MG PO CAPS
0.4000 mg | ORAL_CAPSULE | Freq: Every day | ORAL | Status: DC
  Administered 2016-05-27 – 2016-05-28 (×2): 0.4 mg via ORAL
  Filled 2016-05-27 (×3): qty 1

## 2016-05-27 MED ORDER — HYDROXYUREA 500 MG PO CAPS
500.0000 mg | ORAL_CAPSULE | Freq: Every day | ORAL | Status: DC
  Filled 2016-05-27: qty 1

## 2016-05-27 MED ORDER — POTASSIUM CHLORIDE IN NACL 20-0.9 MEQ/L-% IV SOLN
INTRAVENOUS | Status: AC
  Administered 2016-05-27: 02:00:00 via INTRAVENOUS
  Filled 2016-05-27: qty 1000

## 2016-05-27 MED ORDER — HYDROXYUREA 500 MG PO CAPS
500.0000 mg | ORAL_CAPSULE | ORAL | Status: DC
  Filled 2016-05-27: qty 1

## 2016-05-27 MED ORDER — CHOLESTYRAMINE 4 G PO PACK
4.0000 g | PACK | Freq: Three times a day (TID) | ORAL | Status: DC
  Administered 2016-05-27 – 2016-05-28 (×4): 4 g via ORAL
  Filled 2016-05-27 (×7): qty 1

## 2016-05-27 MED ORDER — ACETAMINOPHEN 650 MG RE SUPP: 650.0000 mg | Freq: Four times a day (QID) | RECTAL | Status: DC | PRN

## 2016-05-27 MED ORDER — CITALOPRAM HYDROBROMIDE 10 MG PO TABS
10.0000 mg | ORAL_TABLET | Freq: Every day | ORAL | Status: DC
  Administered 2016-05-27 – 2016-05-29 (×3): 10 mg via ORAL
  Filled 2016-05-27 (×3): qty 1

## 2016-05-27 MED ORDER — ENOXAPARIN SODIUM 40 MG/0.4ML ~~LOC~~ SOLN
40.0000 mg | Freq: Every day | SUBCUTANEOUS | Status: DC
  Administered 2016-05-27 – 2016-05-29 (×3): 40 mg via SUBCUTANEOUS
  Filled 2016-05-27 (×3): qty 0.4

## 2016-05-27 MED ORDER — SODIUM CHLORIDE 0.9 % IV SOLN
30.0000 meq | Freq: Once | INTRAVENOUS | Status: AC
  Administered 2016-05-27: 30 meq via INTRAVENOUS
  Filled 2016-05-27: qty 15

## 2016-05-27 MED ORDER — HYDROXYUREA 500 MG PO CAPS: 500.0000 mg | ORAL_CAPSULE | Freq: Every day | ORAL | Status: DC

## 2016-05-27 NOTE — Progress Notes (Signed)
Since 7am pt has just 2 episodes of loose BM so far, is on IV potassium continue, vitals stable, IVABX continue for C-diff, will continue to monitor the patient

## 2016-05-27 NOTE — Progress Notes (Signed)
Admitted pt from [facility] assisted by the carelink team. Alert oriented x 3, complains of R&L hip pain with PS 6/10 aching sharp pain more on the left hip, denies chest pain, with dyspnea upon exertion, had loose watery BM 8x today, denies nausea and vomiting. Called admitting William Villanueva) with this admission. Place on telemetry box MX40-04 and verified by T[place]ia (NT). Will continue to monitor pt

## 2016-05-27 NOTE — H&P (Signed)
History and Physical  Patient Name: William Villanueva     RFX:[id number]    DOB: 04-Oct-1950    DOA: 05/26/2016 PCP: [name], MD   Patient coming from: Home --> [facility]  Chief Complaint: Diarrhea, dizziness, hypotension  HPI: William Villanueva is a 66 y.o. male with a past medical history significant for CHF EF 50%, bradycardia with pacer, PV, and HTN who presents with dizziness and hypotension after 3 days diarrhea.  The patient first developed NBNB emesis 3-4 times on Wednesday, accompanied by frequent watery stools.  He wasn't able to keep any of his HTN meds or diuretics down Weds or Thurs because of continued vomiting diarrhea, but restarted them this morning, and unfortunately, through the day, started to feel dizziness and pre-syncope with standing, checked his BP at home and found it to be in the 26E systolic, and noticed he made no urine output from his morning lasix today, so he came in to the ER.  ED course: -Afebrile, heart rate 79, respirations and pulse ox normal, initial BP 86/56 -Na 134, K 3.4, Cr 3.0 (baseline 1.1), WBC 11.9K, Hgb 13.5 -Troponin 0.03, lactic acid 2.0 -Lipase normal -UA without hematuria or pyuria or casts -CXR showed known CM without focal opacities -He was given 2L NS and TR[facility] were asked to accept in transfer   The patient's wife developed a similar GE like illness with vomiting, diarrhea on Tuesday.  The patient was finishing a course of unknown antibiotics for UTI when this started, had two days left.    ROS: Review of Systems  Constitutional: Negative for chills and fever.  Gastrointestinal: Positive for diarrhea, nausea and vomiting. Negative for abdominal pain, blood in stool and melena.  Neurological: Positive for dizziness. Negative for loss of consciousness.  All other systems reviewed and are negative.         Past Medical History:  Diagnosis Date  . AV block, 2nd degree 2015   St. Jude Allure Quadra pulse generator [id number], model PM  3242  . Back pain   . Cardiomyopathy ([place])    Nonischemic 45%.   . CHF (congestive heart failure) ([place])   . Gout   . Hemochromatosis   . Hypertension   . Hypospadias 07/15/1950   born with  . Nephrolithiasis   . Polycythemia vera(238.4)     Past Surgical History:  Procedure Laterality Date  . ANKLE SURGERY    . BI-VENTRICULAR PACEMAKER INSERTION N/A 04/29/2014   Procedure: BI-VENTRICULAR PACEMAKER INSERTION (CRT-P);  Surgeon: Deboraha Sprang, MD; Laterality: Left  St. Jude Allure Quadra pulse generator [id number], model PM [phone]  . PILONIDAL CYST EXCISION    . POSTERIOR LAMINECTOMY / DECOMPRESSION LUMBAR SPINE    . TONSILLECTOMY      Social History: Patient lives with his wife.  The patient walks unassisted.  He is a former smoker.    Allergies  Allergen Reactions  . Prednisone Itching and Other (See Comments)    Abdominal pain  . Prozac [Fluoxetine Hcl] Itching  . Temazepam Other (See Comments)    Dizziness   . Trazodone And Nefazodone Other (See Comments)    Dizziness  . Ibuprofen Itching  . Wellbutrin [Bupropion] Hives    Family history: family history includes Diabetes in his sister; Heart disease in his maternal grandmother; Hypertension in his sister; Other in his father; Stroke in his mother.  Prior to Admission medications   Medication Sig Start Date End Date Taking? Authorizing Provider  aspirin 81 MG tablet Take 81 mg  by mouth daily.   Yes Historical Provider, MD  carvedilol (COREG) 12.5 MG tablet TAKE 1 TABLET (12.5 MG TOTAL) BY MOUTH 2 (TWO) TIMES DAILY WITH A MEAL. 02/24/16  Yes Lelon Perla, MD  citalopram (CELEXA) 10 MG tablet Take 10 mg by mouth daily.  01/27/16  Yes Historical Provider, MD  furosemide (LASIX) 20 MG tablet Take 20 mg by mouth daily.  01/12/16  Yes Historical Provider, MD  hydroxyurea (HYDREA) 500 MG capsule Take 500 mg by mouth. Take one tablet by mouth every other day and two tablets by mouth the other days.   Yes Historical Provider, MD    sacubitril-valsartan (ENTRESTO) 24-26 MG Take 1 tablet by mouth 2 (two) times daily. 02/02/16  Yes Lelon Perla, MD  silodosin (RAPAFLO) 8 MG CAPS capsule Take 8 mg by mouth.  03/24/15  Yes Historical Provider, MD  spironolactone (ALDACTONE) 25 MG tablet TAKE 1/2 TABLET BY MOUTH DAILY 02/24/16  Yes Lelon Perla, MD  ULORIC 40 MG tablet TAKE 1 TABLET BY MOUTH DAILY. 07/26/15  Yes Volanda Napoleon, MD  docusate sodium (STOOL SOFTENER) 100 MG capsule Take 100 mg by mouth.    Historical Provider, MD  Elastic Bandages & Supports (WRIST SPLINT/COCK-UP/RIGHT M) MISC 1 each by Does not apply route once. 10/19/15   Donika Keith Rake, DO  furosemide (LASIX) 20 MG tablet Take 1 tablet (20 mg total) by mouth daily. 04/28/16   Lelon Perla, MD  omeprazole (PRILOSEC) 20 MG capsule Take 1 capsule by mouth daily. 04/09/15   Historical Provider, MD  polyvinyl alcohol (LIQUIFILM TEARS) 1.4 % ophthalmic solution Place 1-2 drops into both eyes as needed for dry eyes.    Historical Provider, MD       Physical Exam: BP (!) 114/58 (BP Location: Left Arm)   Pulse 81   Temp 97.7 F (36.5 C) (Oral)   Resp (!) 22   Ht 5' 10"  (1.778 m)   Wt 134.9 kg (297 lb 4.8 oz)   SpO2 98%   BMI 42.66 kg/m  General appearance: Well-developed, obese adult male, alert and in no acute distress, appears tired.   Eyes: Anicteric, conjunctiva pink, lids and lashes normal. PERRL.    ENT: No nasal deformity, discharge, epistaxis.  Hearing normal. OP dry without lesions.   Neck: No neck masses.  Trachea midline.  No thyromegaly/tenderness. Lymph: No cervical or supraclavicular lymphadenopathy. Skin: Warm and dry.  No jaundice.  No suspicious rashes or lesions. Cardiac: RRR, nl S1-S2, no murmurs appreciated.  Capillary refill is brisk.  JVP not visible.  No LE edema.  Radial and DP pulses 2+ and symmetric. Respiratory: Normal respiratory rate and rhythm.  CTAB without rales or wheezes. Abdomen: Abdomen soft.  No TTP. No ascites,  distension, hepatosplenomegaly.   MSK: No deformities or effusions.  No cyanosis or clubbing. Neuro: Cranial nerves 3-12 intact.  Sensation intact to light touch. Speech is fluent.  Muscle strength normal.    Psych: Sensorium intact and responding to questions, attention normal.  Behavior appropriate.  Affect normal.  Judgment and insight appear normal.     Labs on Admission:  I have personally reviewed following labs and imaging studies: CBC:  Recent Labs Lab 05/26/16 2033  WBC 11.9*  NEUTROABS 8.5*  HGB 13.5  HCT 43.3  MCV 91.2  PLT 562*   Basic Metabolic Panel:  Recent Labs Lab 05/26/16 2033  NA 134*  K 3.4*  CL 104  CO2 18*  GLUCOSE 146*  BUN 38*  CREATININE 3.05*  CALCIUM 7.9*   GFR: Estimated Creatinine Clearance: 33.8 mL/min (by C-G formula based on SCr of 3.05 mg/dL (H)).  Liver Function Tests:  Recent Labs Lab 05/26/16 2033  AST 49*  ALT 46  ALKPHOS 75  BILITOT 0.5  PROT 7.4  ALBUMIN 3.9    Recent Labs Lab 05/26/16 2033  LIPASE 25   No results for input(s): AMMONIA in the last 168 hours. Coagulation Profile: No results for input(s): INR, PROTIME in the last 168 hours. Cardiac Enzymes:  Recent Labs Lab 05/26/16 2030  TROPONINI 0.03*   BNP (last 3 results) No results for input(s): PROBNP in the last 8760 hours. HbA1C: No results for input(s): HGBA1C in the last 72 hours. CBG: No results for input(s): GLUCAP in the last 168 hours. Lipid Profile: No results for input(s): CHOL, HDL, LDLCALC, TRIG, CHOLHDL, LDLDIRECT in the last 72 hours. Thyroid Function Tests: No results for input(s): TSH, T4TOTAL, FREET4, T3FREE, THYROIDAB in the last 72 hours. Anemia Panel: No results for input(s): VITAMINB12, FOLATE, FERRITIN, TIBC, IRON, RETICCTPCT in the last 72 hours. Sepsis Labs: Lactic acid 2.0 Invalid input(s): PROCALCITONIN, LACTICIDVEN No results found for this or any previous visit (from the past 240 hour(s)).       Radiological  Exams on Admission: Personally reviewed CXR shows no focal opacities or edema: Dg Chest 2 View  Result Date: 05/26/2016 CLINICAL DATA:  Nausea vomiting and diarrhea for 3 days EXAM: CHEST  2 VIEW COMPARISON:  01/19/2015 FINDINGS: Left-sided multi lead pacing device is similar compared to prior. There is borderline to mild cardiomegaly without overt failure. Prominent interstitial opacities in the lung bases likely chronic. No consolidation. No effusion. No pneumothorax. Degenerative changes of the spine. IMPRESSION: 1. Borderline to mild cardiomegaly without overt failure. No acute infiltrate. Electronically Signed   By: Donavan Foil M.D.   On: 05/26/2016 22:08    EKG: Independently reviewed. Rate 75, QTc 492, paced rhythm.  Echocardiogram Feb 2016: EF 50-55% Grade I DD PAP 38    Assessment/Plan  1. Acute kidney injury:  Suspect ischemic injury in context of hypovolemia, Entresto, furosemide.  Denies NSAIDs.  UA bland.  Hypotensive on arrival.   -Check FeNa and FeUrea -Fluids and trend BMP -Strict I/Os   2. Diarrhea: -Check C diff, if negative start loperamide -Check GI DNA PCR  3. Elevated troponin:  Suspect demand, no chest pain present -Trend troponin  4. Hyponatremia:  Hypovolemic. -Fluids and trend BMP  4. Hypokalemia:  -MIVF with K -Magnesium level and repeat BMP ordered  5. Chronic systolic and diastolic CHF:  EF 73-71% at baseline in 2016.   Patient of Dr. Stanford Breed -Hold diuretics -Hold BB -Hold Entresto -Continue aspirin -Strict I/Os -Monitor on telemetry  6. Hypertension:  Hypotensive from hypovolemia at arrival.  -Hold Coreg, diuretics, Entresto  7. Other medications:  -Continue silodosin as formulary alternative -Continue Hydrea for poly vera -Continue citalopram -Continue Uloric     DVT prophylaxis: Low dose Lovenox  Code Status: FULL  Family Communication: None present  Disposition Plan: Anticipate IV fluids, trend BMP Consults called:  None Admission status: INPATIENT, telemetry         Medical decision making: Patient seen at 1:26 AM on 05/27/2016.  The patient was discussed with Dr. Oleta Mouse.  What exists of the patient's chart was reviewed in depth and summarized above.  Clinical condition: stable.        Edwin Dada [facility] Pager [phone]  At the time of admission, it appears that the appropriate admission status for this patient is INPATIENT. This is judged to be reasonable and necessary in order to provide the required intensity of service to ensure the patient's safety given the presenting symptoms, physical exam findings, and initial radiographic and laboratory data in the context of their chronic comorbidities.  Together, these circumstances are felt to place him at high risk for further clinical deterioration threatening life, limb, or organ.   Patient requires inpatient status due to high intensity of service, high risk for further deterioration and high frequency of surveillance required because of this acute illness that poses a threat to life, limb or bodily function.  Factors that support inpatient status include: Creatinine doubling from baseline 1.1 in setting of chronic systolic and diastolic CHF, multiple electrolytes derangements (sodium and potassium), elevated troponin, history of bradycardia with pacemeaker, and history of hypertension on multiple hypertension medicisnes  I certify that at the point of admission it is my clinical judgment that the patient will require inpatient hospital care spanning beyond 2 midnights from the point of admission and that early discharge would result in unnecessary risk of decompensation and readmission or threat to life, limb or bodily function.

## 2016-05-27 NOTE — Progress Notes (Signed)
PROGRESS NOTE  William Villanueva  XBL:[id number] DOB: Mar 20, 1951  DOA: 05/26/2016 PCP: [name], MD   Brief Narrative:  Patient is a 66 year old Caucasian gentleman with history of hypertension, nonischemic cardiomyopathy/chronic CHF, EF of 50% status post pacemaker placement for bradycardia. He presented with three-day history of frequent multiple watery nonmucoid nonbloody diarrhea without fever or chills, associated with dizziness (systolic blood pressure check at home was in the 70's mmHg, and had not been able to take his hypertensive medication(s), nausea and an episode of vomiting.  Evaluation of the ED electrolyte imbalance with sodium of 134, potassium 3.4 and increased creatinine of 3.0 from baseline of 1.1 as well as mild leukocytosis of 11.9 K. lactic acid level is 2.0. His UA was unremarkable and chest x-ray did not reveal any airspace disease. He was given IV normal saline bolus at the emergency room and admitted for further care to acute hospital.  Assessment & Plan:   Principal Problem:   Acute renal failure (ARF) ([place]) Active Problems:   Essential (primary) hypertension   Chronic combined systolic and diastolic congestive heart failure (HCC)   Hyponatremia   Hypokalemia  1. Clostridium difficile colitis: C. difficile toxin positive this morning. Enteric pathogen isolation precautions. Flagyl. Questran . May have to use oral vancomycin but in this case questran need to be discontinued. udicious IV fluid rehydration with history of cardiomyopathy. Monitor electrolytes and renal function. Monitor I's and O's. Follow-up on GI DNA PCR  2. Acute renal failure: Due to hypovolemia/hypotension.  Follow renal function with electrolytes. Hold diuretics/ARB/ nephrotoxic(s)  3. Hypokalemia with Hyponatremia: GI loss. Potassium repletion with saline infusion. Check magnesium  4. Metabolic acidosis: Due to diarrhea with acute renal insufficiency.  Should improve  current supportive care.  No ABG done-consider if no improvement or worsening.  5. Elevated troponin:  No evidence of acute canal syndrome - chest pain-free. Trend troponin levels.  4. Hypertensive heart disease with chronic systolic and diastolic congestive cardiomyopathy:   Hold diuretics, Entresto, Coreg for now. May resume when blood pressure stabilizes   DVT prophylaxis: Lovenox Code Status: Full code Family Communication:  Disposition Plan: Home   Consultants:  N/A  Procedures:  none  Antimicrobials:  Flagyl  Subjective: Still with ongoing Diarrhea  Objective:  Vitals:   05/27/16 0003 05/27/16 0007 05/27/16 0440 05/27/16 1239  BP:  (!) 114/58 (!) 98/49 124/64  Pulse:  81 86 80  Resp:  (!) 22 18 18   Temp:  97.7 F (36.5 C) 97.7 F (36.5 C) 97.7 F (36.5 C)  TempSrc:  Oral Oral Oral  SpO2:  98% 98% 98%  Weight: 134.9 kg (297 lb 4.8 oz)     Height: 5' 10"  (1.778 m)       Intake/Output Summary (Last 24 hours) at 05/27/16 1602 Last data filed at 05/27/16 1500  Gross per 24 hour  Intake          1088.33 ml  Output              503 ml  Net           585.33 ml   Filed Weights   05/26/16 1933 05/27/16 0003  Weight: 134.3 kg (296 lb) 134.9 kg (297 lb 4.8 oz)    Examination:  General exam: Rested comfortably, no acute distress. HEENT: - No JVD, mild scleral pallor, dry mucous membranes Respiratory system: Clear to auscultation. Respiratory effort normal. Cardiovascular system: S1 & S2 heard, RRR. No JVD, murmurs, rubs, gallops or clicks. Trace  pedal edema. Gastrointestinal system: Abdomen is nondistended, soft and nontender. No organomegaly or masses felt. Normal bowel sounds heard. Central nervous system: Alert and oriented. No focal neurological deficits. Extremities: Symmetric 5 x 5 power. Skin: No rashes, lesions or ulcers Psychiatry: Judgement and insight appear normal  Data Reviewed: I have personally reviewed following labs and imaging  studies  CBC:  Recent Labs Lab 05/26/16 2033 05/27/16 0634  WBC 11.9* 11.7*  NEUTROABS 8.5*  --   HGB 13.5 12.5*  HCT 43.3 40.4  MCV 91.2 91.8  PLT 455* 482   Basic Metabolic Panel:  Recent Labs Lab 05/26/16 2033 05/27/16 0634  NA 134* 136  K 3.4* 3.1*  CL 104 111  CO2 18* 17*  GLUCOSE 146* 132*  BUN 38* 35*  CREATININE 3.05* 2.47*  CALCIUM 7.9* 7.6*  MG  --  1.2*   GFR: Estimated Creatinine Clearance: 41.8 mL/min (by C-G formula based on SCr of 2.47 mg/dL (H)). Liver Function Tests:  Recent Labs Lab 05/26/16 2033  AST 49*  ALT 46  ALKPHOS 75  BILITOT 0.5  PROT 7.4  ALBUMIN 3.9    Recent Labs Lab 05/26/16 2033  LIPASE 25   No results for input(s): AMMONIA in the last 168 hours. Coagulation Profile: No results for input(s): INR, PROTIME in the last 168 hours. Cardiac Enzymes:  Recent Labs Lab 05/26/16 2030 05/27/16 0634  TROPONINI 0.03* 0.03*   BNP (last 3 results) No results for input(s): PROBNP in the last 8760 hours. HbA1C: No results for input(s): HGBA1C in the last 72 hours. CBG: No results for input(s): GLUCAP in the last 168 hours. Lipid Profile: No results for input(s): CHOL, HDL, LDLCALC, TRIG, CHOLHDL, LDLDIRECT in the last 72 hours. Thyroid Function Tests: No results for input(s): TSH, T4TOTAL, FREET4, T3FREE, THYROIDAB in the last 72 hours. Anemia Panel: No results for input(s): VITAMINB12, FOLATE, FERRITIN, TIBC, IRON, RETICCTPCT in the last 72 hours.  Sepsis Labs:  Recent Labs Lab 05/26/16 2033 05/26/16 2055 05/27/16 0634  WBC 11.9*  --  11.7*  LATICACIDVEN  --  2.00*  --     Recent Results (from the past 240 hour(s))  C difficile quick scan w PCR reflex     Status: Abnormal   Collection Time: 05/27/16  1:25 AM  Result Value Ref Range Status   C Diff antigen POSITIVE (A) NEGATIVE Final   C Diff toxin NEGATIVE NEGATIVE Final   C Diff interpretation Results are indeterminate. See PCR results.  Final  Clostridium  Difficile by PCR     Status: Abnormal   Collection Time: 05/27/16  1:25 AM  Result Value Ref Range Status   Toxigenic C Difficile by pcr POSITIVE (A) NEGATIVE Final    Comment: Positive for toxigenic C. difficile with little to no toxin production. Only treat if clinical presentation suggests symptomatic illness.         Radiology Studies: Dg Chest 2 View  Result Date: 05/26/2016 CLINICAL DATA:  Nausea vomiting and diarrhea for 3 days EXAM: CHEST  2 VIEW COMPARISON:  01/19/2015 FINDINGS: Left-sided multi lead pacing device is similar compared to prior. There is borderline to mild cardiomegaly without overt failure. Prominent interstitial opacities in the lung bases likely chronic. No consolidation. No effusion. No pneumothorax. Degenerative changes of the spine. IMPRESSION: 1. Borderline to mild cardiomegaly without overt failure. No acute infiltrate. Electronically Signed   By: Donavan Foil M.D.   On: 05/26/2016 22:08        Scheduled Meds: .  aspirin EC  81 mg Oral Daily  . cholestyramine  4 g Oral TID  . citalopram  10 mg Oral Daily  . enoxaparin (LOVENOX) injection  40 mg Subcutaneous Daily  . febuxostat  40 mg Oral Daily  . [START ON 05/28/2016] hydroxyurea  500 mg Oral Q48H   And  . [START ON 05/28/2016] hydroxyurea  500 mg Oral Q breakfast  . metronidazole  500 mg Intravenous Q8H  . potassium chloride  20 mEq Oral BID  . sodium chloride flush  3 mL Intravenous Q12H  . tamsulosin  0.4 mg Oral Daily   Continuous Infusions:   LOS: 0 days    Time spent: 30 min    OSEI-BONSU,Tirrell Buchberger, MD [facility] Pager [phone]  If 7PM-7AM, please contact night-coverage [email or web address] Password [email or web address] 05/27/2016, 4:02 PM

## 2016-05-28 LAB — BASIC METABOLIC PANEL
Anion gap: 8 (ref 5–15)
Anion gap: 6 (ref 5–15)
BUN: 21 mg/dL — ABNORMAL HIGH (ref 6–20)
BUN: 16 mg/dL (ref 6–20)
CO2: 16 mmol/L — ABNORMAL LOW (ref 22–32)
CO2: 17 mmol/L — ABNORMAL LOW (ref 22–32)
Calcium: 7.9 mg/dL — ABNORMAL LOW (ref 8.9–10.3)
Calcium: 7.9 mg/dL — ABNORMAL LOW (ref 8.9–10.3)
Chloride: 114 mmol/L — ABNORMAL HIGH (ref 101–111)
Chloride: 113 mmol/L — ABNORMAL HIGH (ref 101–111)
Creatinine, Ser: 1.44 mg/dL — ABNORMAL HIGH (ref 0.61–1.24)
Creatinine, Ser: 1.25 mg/dL — ABNORMAL HIGH (ref 0.61–1.24)
GFR calc Af Amer: 58 mL/min — ABNORMAL LOW (ref >60)
GFR calc Af Amer: >60 mL/min (ref >60)
GFR calc non Af Amer: 50 mL/min — ABNORMAL LOW (ref >60)
GFR calc non Af Amer: 59 mL/min — ABNORMAL LOW (ref >60)
Glucose, Bld: 114 mg/dL — ABNORMAL HIGH (ref 65–99)
Glucose, Bld: 108 mg/dL — ABNORMAL HIGH (ref 65–99)
Potassium: 3.9 mmol/L (ref 3.5–5.1)
Potassium: 4.2 mmol/L (ref 3.5–5.1)
Sodium: 138 mmol/L (ref 135–145)
Sodium: 136 mmol/L (ref 135–145)

## 2016-05-28 LAB — UREA NITROGEN, URINE: Urea Nitrogen, Ur: 617 mg/dL

## 2016-05-28 MED ORDER — HYDROXYUREA 500 MG PO CAPS
500.0000 mg | ORAL_CAPSULE | Freq: Every day | ORAL | Status: DC
  Administered 2016-05-28 – 2016-05-29 (×2): 500 mg via ORAL
  Filled 2016-05-28 (×2): qty 1

## 2016-05-28 MED ORDER — HYDROXYUREA 500 MG PO CAPS
500.0000 mg | ORAL_CAPSULE | ORAL | Status: DC
  Administered 2016-05-28: 500 mg via ORAL
  Filled 2016-05-28: qty 1

## 2016-05-28 MED ORDER — HYDROXYUREA 500 MG PO CAPS: 500.0000 mg | ORAL_CAPSULE | ORAL | Status: DC

## 2016-05-28 MED ORDER — SODIUM CHLORIDE 0.9 % IV SOLN
INTRAVENOUS | Status: DC
  Administered 2016-05-28 – 2016-05-29 (×2): via INTRAVENOUS

## 2016-05-28 MED ORDER — HYDROXYUREA 500 MG PO CAPS: 500.0000 mg | ORAL_CAPSULE | Freq: Every day | ORAL | Status: DC

## 2016-05-28 NOTE — Progress Notes (Signed)
PROGRESS NOTE  William Villanueva  NLZ:[id number] DOB: 1951/03/14  DOA: 05/26/2016 PCP: [name], MD   Brief Narrative:  Patient is a 66 year old Caucasian gentleman with history of hypertension, nonischemic cardiomyopathy/chronic CHF, EF of 50% status post pacemaker placement for bradycardia. He presented with three-day history of frequent multiple watery nonmucoid nonbloody diarrhea without fever or chills, associated with dizziness (systolic blood pressure check at home was in the 70's mmHg, and had not been able to take his hypertensive medication(s), nausea and an episode of vomiting.  Evaluation of the ED electrolyte imbalance with sodium of 134, potassium 3.4 and increased creatinine of 3.0 from baseline of 1.1 as well as mild leukocytosis of 11.9 K. lactic acid level is 2.0. His UA was unremarkable and chest x-ray did not reveal any airspace disease. He was given IV normal saline bolus at the emergency room and admitted for further care to acute hospital.  Assessment & Plan:   Principal Problem:   Acute renal failure (ARF) ([place]) Active Problems:   Essential (primary) hypertension   Chronic combined systolic and diastolic congestive heart failure (HCC)   Hyponatremia   Hypokalemia  1. Clostridium difficile colitis: C. difficile toxin positive. Enteric pathogen isolation precautions. Flagyl. Questran . IV fluid Rehydration with history of cardiomyopathy. Monitor electrolytes and renal function. Monitor I's and O's. Follow-up on GI DNA PCR  2. Acute renal failure: Due to hypovolemia/hypotension. Improving with current regiemen.  Follow renal function with electrolytes. Hold diuretics/ARB/ nephrotoxic(s)  3. Hypokalemia with Hyponatremia: GI loss. Resolved.  4. Metabolic acidosis: Due to GI loss of HCO3, diarrhea. Persists despite overall clinical improvement Consider HCO3 infusion if not better, but believe should improve with continued hydration   No ABG  done-consider if not improvement or worsening   5. 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 May resume when blood pressure and Renal Function Stabilizes.   DVT prophylaxis: Lovenox Code Status: Full code Family Communication:  Disposition Plan: Home   Consultants:  N/A  Procedures:  none  Antimicrobials:  Flagyl  Subjective: Diarrhea a lot better, no abdoiminal pains, tolerating oral fluids, sitting comfortably in a chair. No F/C  Objective:  Vitals:   05/27/16 1239 05/27/16 2118 05/28/16 0517 05/28/16 1238  BP: 124/64 137/65 (!) 127/56 128/62  Pulse: 80 80 90 79  Resp: 18 19 19 18   Temp: 97.7 F (36.5 C) 98 F (36.7 C) 98.4 F (36.9 C) 98.8 F (37.1 C)  TempSrc: Oral Oral Oral Oral  SpO2: 98% 98% 97% 99%  Weight:   (!) 136.2 kg (300 lb 4.8 oz)   Height:        Intake/Output Summary (Last 24 hours) at 05/28/16 2001 Last data filed at 05/28/16 1800  Gross per 24 hour  Intake          1643.33 ml  Output             1650 ml  Net            -6.67 ml   Filed Weights   05/26/16 1933 05/27/16 0003 05/28/16 0517  Weight: 134.3 kg (296 lb) 134.9 kg (297 lb 4.8 oz) (!) 136.2 kg (300 lb 4.8 oz)    Examination:  General exam: Rested comfortably, no acute distress. HEENT: - No JVD, mild scleral pallor, minimally dry mucous membranes Respiratory system: Clear to auscultation. Respiratory effort normal. Cardiovascular system: S1 & S2 heard, RRR. No JVD, murmurs, rubs, gallops  or clicks. Trace pedal edema. Gastrointestinal system: Abdomen is nondistended, soft and nontender. No organomegaly or masses felt. Normal bowel sounds heard. Central nervous system: Alert and oriented. No focal neurological deficits. Extremities: Symmetric 5 x 5 power. Skin: No rashes, lesions or ulcers Psychiatry: Judgement and insight appear normal  Data Reviewed: I have personally reviewed  following labs and imaging studies  CBC:  Recent Labs Lab 05/26/16 2033 05/27/16 0634  WBC 11.9* 11.7*  NEUTROABS 8.5*  --   HGB 13.5 12.5*  HCT 43.3 40.4  MCV 91.2 91.8  PLT 455* 426   Basic Metabolic Panel:  Recent Labs Lab 05/26/16 2033 05/27/16 0634 05/27/16 1627 05/28/16 0521 05/28/16 1619  NA 134* 136 136 138 136  K 3.4* 3.1* 4.0 3.9 4.2  CL 104 111 113* 114* 113*  CO2 18* 17* 15* 16* 17*  GLUCOSE 146* 132* 110* 114* 108*  BUN 38* 35* 31* 21* 16  CREATININE 3.05* 2.47* 1.82* 1.44* 1.25*  CALCIUM 7.9* 7.6* 7.7* 7.9* 7.9*  MG  --  1.2*  --   --   --    GFR: Estimated Creatinine Clearance: 83 mL/min (by C-G formula based on SCr of 1.25 mg/dL (H)). Liver Function Tests:  Recent Labs Lab 05/26/16 2033  AST 49*  ALT 46  ALKPHOS 75  BILITOT 0.5  PROT 7.4  ALBUMIN 3.9    Recent Labs Lab 05/26/16 2033  LIPASE 25   No results for input(s): AMMONIA in the last 168 hours. Coagulation Profile: No results for input(s): INR, PROTIME in the last 168 hours. Cardiac Enzymes:  Recent Labs Lab 05/26/16 2030 05/27/16 0634  TROPONINI 0.03* 0.03*   BNP (last 3 results) No results for input(s): PROBNP in the last 8760 hours. HbA1C: No results for input(s): HGBA1C in the last 72 hours. CBG: No results for input(s): GLUCAP in the last 168 hours. Lipid Profile: No results for input(s): CHOL, HDL, LDLCALC, TRIG, CHOLHDL, LDLDIRECT in the last 72 hours. Thyroid Function Tests: No results for input(s): TSH, T4TOTAL, FREET4, T3FREE, THYROIDAB in the last 72 hours. Anemia Panel: No results for input(s): VITAMINB12, FOLATE, FERRITIN, TIBC, IRON, RETICCTPCT in the last 72 hours.  Sepsis Labs:  Recent Labs Lab 05/26/16 2033 05/26/16 2055 05/27/16 0634  WBC 11.9*  --  11.7*  LATICACIDVEN  --  2.00*  --     Recent Results (from the past 240 hour(s))  C difficile quick scan w PCR reflex     Status: Abnormal   Collection Time: 05/27/16  1:25 AM  Result Value  Ref Range Status   C Diff antigen POSITIVE (A) NEGATIVE Final   C Diff toxin NEGATIVE NEGATIVE Final   C Diff interpretation Results are indeterminate. See PCR results.  Final  Gastrointestinal Panel by PCR , Stool     Status: Abnormal   Collection Time: 05/27/16  1:25 AM  Result Value Ref Range Status   Campylobacter species NOT DETECTED NOT DETECTED Final   Plesimonas shigelloides NOT DETECTED NOT DETECTED Final   Salmonella species NOT DETECTED NOT DETECTED Final   Yersinia enterocolitica NOT DETECTED NOT DETECTED Final   Vibrio species NOT DETECTED NOT DETECTED Final   Vibrio cholerae NOT DETECTED NOT DETECTED Final   Enteroaggregative E coli (EAEC) NOT DETECTED NOT DETECTED Final   Enteropathogenic E coli (EPEC) NOT DETECTED NOT DETECTED Final   Enterotoxigenic E coli (ETEC) NOT DETECTED NOT DETECTED Final   Shiga like toxin producing E coli (STEC) NOT DETECTED NOT DETECTED Final  Shigella/Enteroinvasive E coli (EIEC) NOT DETECTED NOT DETECTED Final   Cryptosporidium NOT DETECTED NOT DETECTED Final   Cyclospora cayetanensis NOT DETECTED NOT DETECTED Final   Entamoeba histolytica NOT DETECTED NOT DETECTED Final   Giardia lamblia NOT DETECTED NOT DETECTED Final   Adenovirus F40/41 NOT DETECTED NOT DETECTED Final   Astrovirus NOT DETECTED NOT DETECTED Final   Norovirus GI/GII DETECTED (A) NOT DETECTED Final    Comment: RESULT CALLED TO, READ BACK BY AND VERIFIED WITH: RAKHA ARYEL 05/27/16 @ 2620  MLK    Rotavirus A NOT DETECTED NOT DETECTED Final   Sapovirus (I, II, IV, and V) NOT DETECTED NOT DETECTED Final  Clostridium Difficile by PCR     Status: Abnormal   Collection Time: 05/27/16  1:25 AM  Result Value Ref Range Status   Toxigenic C Difficile by pcr POSITIVE (A) NEGATIVE Final    Comment: Positive for toxigenic C. difficile with little to no toxin production. Only treat if clinical presentation suggests symptomatic illness.         Radiology Studies: Dg Chest 2  View  Result Date: 05/26/2016 CLINICAL DATA:  Nausea vomiting and diarrhea for 3 days EXAM: CHEST  2 VIEW COMPARISON:  01/19/2015 FINDINGS: Left-sided multi lead pacing device is similar compared to prior. There is borderline to mild cardiomegaly without overt failure. Prominent interstitial opacities in the lung bases likely chronic. No consolidation. No effusion. No pneumothorax. Degenerative changes of the spine. IMPRESSION: 1. Borderline to mild cardiomegaly without overt failure. No acute infiltrate. Electronically Signed   By: Donavan Foil M.D.   On: 05/26/2016 22:08        Scheduled Meds: . aspirin EC  81 mg Oral Daily  . citalopram  10 mg Oral Daily  . enoxaparin (LOVENOX) injection  40 mg Subcutaneous Daily  . febuxostat  40 mg Oral Daily  . hydroxyurea  500 mg Oral Q48H   And  . hydroxyurea  500 mg Oral Q breakfast  . metronidazole  500 mg Intravenous Q8H  . potassium chloride  20 mEq Oral BID  . sodium chloride flush  3 mL Intravenous Q12H  . tamsulosin  0.4 mg Oral Daily   Continuous Infusions: . sodium chloride 100 mL/hr at 05/28/16 1034     LOS: 1 day    Time spent: 57 min    OSEI-BONSU,Vonetta Foulk, MD [facility] Pager [phone]  If 7PM-7AM, please contact night-coverage [email or web address] Password [email or web address] 05/28/2016, 8:01 PM

## 2016-05-29 DIAGNOSIS — N179 Acute kidney failure, unspecified: Secondary | ICD-10-CM

## 2016-05-29 LAB — CBC WITH DIFFERENTIAL/PLATELET
Basophils Absolute: 0.1 10*3/uL (ref 0.0–0.1)
Basophils Relative: 1 %
Eosinophils Absolute: 0.2 10*3/uL (ref 0.0–0.7)
Eosinophils Relative: 2 %
HCT: 37.7 % — ABNORMAL LOW (ref 39.0–52.0)
Hemoglobin: 11.5 g/dL — ABNORMAL LOW (ref 13.0–17.0)
Lymphocytes Relative: 18 %
Lymphs Abs: 1.6 10*3/uL (ref 0.7–4.0)
MCH: 28 pg (ref 26.0–34.0)
MCHC: 30.5 g/dL (ref 30.0–36.0)
MCV: 92 fL (ref 78.0–100.0)
Monocytes Absolute: 1.1 10*3/uL — ABNORMAL HIGH (ref 0.1–1.0)
Monocytes Relative: 12 %
Neutro Abs: 6 10*3/uL (ref 1.7–7.7)
Neutrophils Relative %: 67 %
Platelets: 335 10*3/uL (ref 150–400)
RBC: 4.1 MIL/uL — ABNORMAL LOW (ref 4.22–5.81)
RDW: 15.2 % (ref 11.5–15.5)
WBC: 9 10*3/uL (ref 4.0–10.5)

## 2016-05-29 LAB — LACTIC ACID, PLASMA
Lactic Acid, Venous: 0.8 mmol/L (ref 0.5–1.9)
Lactic Acid, Venous: 1.3 mmol/L (ref 0.5–1.9)

## 2016-05-29 LAB — BASIC METABOLIC PANEL
Anion gap: 7 (ref 5–15)
BUN: 12 mg/dL (ref 6–20)
CO2: 17 mmol/L — ABNORMAL LOW (ref 22–32)
Calcium: 7.9 mg/dL — ABNORMAL LOW (ref 8.9–10.3)
Chloride: 116 mmol/L — ABNORMAL HIGH (ref 101–111)
Creatinine, Ser: 1.1 mg/dL (ref 0.61–1.24)
GFR calc Af Amer: >60 mL/min (ref >60)
GFR calc non Af Amer: >60 mL/min (ref >60)
Glucose, Bld: 101 mg/dL — ABNORMAL HIGH (ref 65–99)
Potassium: 4.1 mmol/L (ref 3.5–5.1)
Sodium: 140 mmol/L (ref 135–145)

## 2016-05-29 MED ORDER — METRONIDAZOLE 500 MG PO TABS: 500.0000 mg | ORAL_TABLET | Freq: Three times a day (TID) | ORAL | 0 refills | Status: DC

## 2016-05-29 MED FILL — OMEPRAZOLE DR 20 MG CAPSULE: 20 | 30 days supply | Qty: 60 | Fill #1 | Status: TO

## 2016-05-29 MED FILL — CITALOPRAM HBR 10 MG TABLET: 10 | 32 days supply | Qty: 16 | Fill #4

## 2016-05-29 MED FILL — CARVEDILOL 12.5 MG TABLET: 12.5 | 30 days supply | Qty: 60 | Fill #3 | Status: TO

## 2016-05-29 MED FILL — ULORIC 40 MG TABLET: 40 | 30 days supply | Qty: 30 | Fill #2

## 2016-05-29 MED FILL — metroNIDAZOLE 500 MG TABS: 500 | 11 days supply | Qty: 33 | Fill #0

## 2016-05-29 NOTE — Discharge Summary (Signed)
Physician Discharge Summary  William Villanueva NFA:[id number] DOB: [birth year] DOA: 05/26/2016  PCP: [name], MD  Admit date: 05/26/2016 Discharge date: 05/29/2016  Time spent: 25 minutes  Recommendations for Outpatient Follow-up:  1. Needs Chem-7 in about one week 2. Recommend to hold off of Pepto-Bismol, omeprazole and Imodium for this 1 month after treatment of C. difficile 3. Needs outpatient follow-up with cardiology  Discharge Diagnoses:  Principal Problem:   Acute renal failure (ARF) ([place]) Active Problems:   Essential (primary) hypertension   Chronic combined systolic and diastolic congestive heart failure (HCC)   Hyponatremia   Hypokalemia   Discharge Condition: Improved  Diet recommendation: Heart healthy  Filed Weights   05/27/16 0003 05/28/16 0517 05/29/16 0612  Weight: 134.9 kg (297 lb 4.8 oz) (!) 136.2 kg (300 lb 4.8 oz) (!) 136.8 kg (301 lb 8 oz)    History of present illness:  65 ?  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  Evaluation of the ED electrolyte imbalance with sodium of 134, potassium 3.4 and increased creatinine of 3.0 from baseline of 1.1 as well as mild leukocytosis of 11.9 K. lactic acid level is 2.0. His UA was unremarkable and chest x-ray did not reveal any airspace disease. He was given IV normal saline bolus at the emergency room and admitted for further care to acute hospital  Hospital Course:  1. Clostridium difficile colitis: C. difficile toxin  positive. Enteric pathogen isolation precautions. Flagyl. Questran was discontinued subsequently IV fluid Rehydration with history of cardiomyopathy. Kidney function resolved Complete treatment of C. difficile 06/09/2016  2. Acute renal failure: Due to hypovolemia/hypotension. Improving with current regiemen.  Follow renal function with electrolytes. Resume low-dose Lasix and spironolactone on discharge  3. Hypokalemia with Hyponatremia: GI loss. Resolved. Creatinine 3 on admission currently 12/1.1 on discharge and resumed all of her meds  4. Metabolic acidosis: Due to GI loss of HCO3, diarrhea. Persists despite overall clinical improvement Probably secondary to GI losses and will resolve  5. 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 Resumed on discharge    Discharge Exam: Vitals:   05/28/16 1238 05/29/16 0612  BP: 128/62 135/66  Pulse: 79 86  Resp: 18 17  Temp: 98.8 F (37.1 C) 97.7 F (36.5 C)    General: Alert pleasant oriented no apparent distress Cardiovascular: S1-S2 no murmur rub or gallop Respiratory: Clinically clear  Discharge Instructions   Discharge Instructions    Diet - low sodium heart healthy    Complete by:  As directed    Discharge instructions    Complete by:  As directed    pepto-bismol, omeprazole and Imodium need to be held for at least a month after discharge because of her C. difficile No other changes to medication   Increase activity slowly    Complete by:  As directed      Current Discharge Medication List    START taking these medications   Details  metroNIDAZOLE (FLAGYL) 500 MG tablet Take 1 tablet (500 mg total)  by mouth 3 (three) times daily. Qty: 33 tablet, Refills: 0      CONTINUE these medications which have NOT CHANGED   Details  aspirin 81 MG tablet Take 81 mg by mouth daily.    carvedilol (COREG) 12.5 MG tablet  TAKE 1 TABLET (12.5 MG TOTAL) BY MOUTH 2 (TWO) TIMES DAILY WITH A MEAL. Qty: 60 tablet, Refills: 6    citalopram (CELEXA) 10 MG tablet Take 5 mg by mouth daily.     furosemide (LASIX) 20 MG tablet Take 1 tablet (20 mg total) by mouth daily. Qty: 90 tablet, Refills: 2   Associated Diagnoses: SOB (shortness of breath)    hydroxyurea (HYDREA) 500 MG capsule Take 500-1,000 mg by mouth See admin instructions. Take one tablet by mouth every other day and two tablets by mouth the other days.     Melatonin 5 MG TABS Take 5 mg by mouth at bedtime as needed (sleep).    polyvinyl alcohol (LIQUIFILM TEARS) 1.4 % ophthalmic solution Place 1-2 drops into both eyes as needed for dry eyes.    sacubitril-valsartan (ENTRESTO) 24-26 MG Take 1 tablet by mouth 2 (two) times daily. Qty: 60 tablet, Refills: 12    silodosin (RAPAFLO) 8 MG CAPS capsule Take 8 mg by mouth daily.     spironolactone (ALDACTONE) 25 MG tablet TAKE 1/2 TABLET BY MOUTH DAILY Qty: 45 tablet, Refills: 0   Associated Diagnoses: SOB (shortness of breath)    triamcinolone (NASACORT ALLERGY 24HR) 55 MCG/ACT AERO nasal inhaler Place 2 sprays into the nose daily.    ULORIC 40 MG tablet TAKE 1 TABLET BY MOUTH DAILY. Qty: 30 tablet, Refills: 6      STOP taking these medications     bismuth subsalicylate (PEPTO BISMOL) 262 MG/15ML suspension      cephALEXin (KEFLEX) 500 MG capsule      loperamide (IMODIUM) 2 MG capsule      omeprazole (PRILOSEC) 20 MG capsule        Allergies  Allergen Reactions  . Prednisone Itching and Other (See Comments)    Abdominal pain  . Prozac [Fluoxetine Hcl] Itching  . Temazepam Other (See Comments)    Dizziness   . Trazodone And Nefazodone Other (See Comments)    Dizziness  . Ibuprofen Itching  . Wellbutrin [Bupropion] Hives      The results of significant diagnostics from this hospitalization (including imaging, microbiology, ancillary and laboratory) are listed below for reference.     Significant Diagnostic Studies: Dg Chest 2 View  Result Date: 05/26/2016 CLINICAL DATA:  Nausea vomiting and diarrhea for 3 days EXAM: CHEST  2 VIEW COMPARISON:  01/19/2015 FINDINGS: Left-sided multi lead pacing device is similar compared to prior. There is borderline to mild cardiomegaly without overt failure. Prominent interstitial opacities in the lung bases likely chronic. No consolidation. No effusion. No pneumothorax. Degenerative changes of the spine. IMPRESSION: 1. Borderline to mild cardiomegaly without overt failure. No acute infiltrate. Electronically Signed   By: Donavan Foil M.D.   On: 05/26/2016 22:08    Microbiology: Recent Results (from the past 240 hour(s))  C difficile quick scan w PCR reflex     Status: Abnormal   Collection Time: 05/27/16  1:25 AM  Result Value Ref Range Status   C Diff antigen POSITIVE (A) NEGATIVE Final   C Diff toxin NEGATIVE NEGATIVE Final   C Diff interpretation Results are indeterminate. See PCR results.  Final  Gastrointestinal Panel by PCR , Stool     Status:  Abnormal   Collection Time: 05/27/16  1:25 AM  Result Value Ref Range Status   Campylobacter species NOT DETECTED NOT DETECTED Final   Plesimonas shigelloides NOT DETECTED NOT DETECTED Final   Salmonella species NOT DETECTED NOT DETECTED Final   Yersinia enterocolitica NOT DETECTED NOT DETECTED Final   Vibrio species NOT DETECTED NOT DETECTED Final   Vibrio cholerae NOT DETECTED NOT DETECTED Final   Enteroaggregative E coli (EAEC) NOT DETECTED NOT DETECTED Final   Enteropathogenic E coli (EPEC) NOT DETECTED NOT DETECTED Final   Enterotoxigenic E coli (ETEC) NOT DETECTED NOT DETECTED Final   Shiga like toxin producing E coli (STEC) NOT DETECTED NOT DETECTED Final   Shigella/Enteroinvasive E coli (EIEC) NOT DETECTED NOT DETECTED Final   Cryptosporidium NOT DETECTED NOT DETECTED Final   Cyclospora cayetanensis NOT DETECTED NOT DETECTED Final   Entamoeba histolytica NOT DETECTED NOT  DETECTED Final   Giardia lamblia NOT DETECTED NOT DETECTED Final   Adenovirus F40/41 NOT DETECTED NOT DETECTED Final   Astrovirus NOT DETECTED NOT DETECTED Final   Norovirus GI/GII DETECTED (A) NOT DETECTED Final    Comment: RESULT CALLED TO, READ BACK BY AND VERIFIED WITH: RAKHA ARYEL 05/27/16 @ 6578  MLK    Rotavirus A NOT DETECTED NOT DETECTED Final   Sapovirus (I, II, IV, and V) NOT DETECTED NOT DETECTED Final  Clostridium Difficile by PCR     Status: Abnormal   Collection Time: 05/27/16  1:25 AM  Result Value Ref Range Status   Toxigenic C Difficile by pcr POSITIVE (A) NEGATIVE Final    Comment: Positive for toxigenic C. difficile with little to no toxin production. Only treat if clinical presentation suggests symptomatic illness.     Labs: Basic Metabolic Panel:  Recent Labs Lab 05/27/16 0634 05/27/16 1627 05/28/16 0521 05/28/16 1619 05/29/16 0503  NA 136 136 138 136 140  K 3.1* 4.0 3.9 4.2 4.1  CL 111 113* 114* 113* 116*  CO2 17* 15* 16* 17* 17*  GLUCOSE 132* 110* 114* 108* 101*  BUN 35* 31* 21* 16 12  CREATININE 2.47* 1.82* 1.44* 1.25* 1.10  CALCIUM 7.6* 7.7* 7.9* 7.9* 7.9*  MG 1.2*  --   --   --   --    Liver Function Tests:  Recent Labs Lab 05/26/16 2033  AST 49*  ALT 46  ALKPHOS 75  BILITOT 0.5  PROT 7.4  ALBUMIN 3.9    Recent Labs Lab 05/26/16 2033  LIPASE 25   No results for input(s): AMMONIA in the last 168 hours. CBC:  Recent Labs Lab 05/26/16 2033 05/27/16 0634 05/29/16 0503  WBC 11.9* 11.7* 9.0  NEUTROABS 8.5*  --  6.0  HGB 13.5 12.5* 11.5*  HCT 43.3 40.4 37.7*  MCV 91.2 91.8 92.0  PLT 455* 330 335   Cardiac Enzymes:  Recent Labs Lab 05/26/16 2030 05/27/16 0634  TROPONINI 0.03* 0.03*   BNP: BNP (last 3 results) No results for input(s): BNP in the last 8760 hours.  ProBNP (last 3 results) No results for input(s): PROBNP in the last 8760 hours.  CBG: No results for input(s): GLUCAP in the last 168  hours.     SignedNita Sells MD   [facility] 05/29/2016, 10:24 AM

## 2016-05-29 NOTE — Care Management Note (Signed)
Case Management Note  Patient Details  Name: Aveer Merlan MRN: [id number] Date of Birth: 1951-03-13  Subjective/Objective:      Admitted with Acute Renal Failure              Action/Plan: Patient lives at home with spouse; PCP: [name], MD; has private insurance with [facility] with prescription drug coverage; CM following for DCP; awaiting for PT eval for disposition needs.  Expected Discharge Date:    possibly 06/01/2016              Expected Discharge Plan:  [place]  Vs home / self I Discharge planning Services  CM Consult  Status of Service:  In process, will continue to follow  Sherrilyn Rist [phone] 05/29/2016, 9:36 AM

## 2016-05-29 NOTE — Progress Notes (Signed)
Pt discharged with wife at the bedside. Pt denies complaints; expresses desire to go home.  Pt advised his RX has been sent to [place] already.  pts vitals are WNL, pt is d/c from telemetry. Call placed to [facility] to notify of telemetry monitoring d/c. IV discontinued; without complications.

## 2016-05-30 ENCOUNTER — Other Ambulatory Visit: Payer: Self-pay | Admitting: Cardiology

## 2016-05-30 DIAGNOSIS — R0602 Shortness of breath: Secondary | ICD-10-CM

## 2016-05-30 MED FILL — ENTRESTO 24 MG-26 MG TABLET: 24-26 | 30 days supply | Qty: 60 | Fill #2

## 2016-05-30 MED FILL — SPIRONOLACTONE 25 MG TABLET: 25 | 90 days supply | Qty: 45 | Fill #0 | Status: TO

## 2016-05-30 NOTE — Telephone Encounter (Signed)
REFILL 

## 2016-06-14 DIAGNOSIS — A0472 Enterocolitis due to Clostridium difficile, not specified as recurrent: Secondary | ICD-10-CM | POA: Diagnosis not present

## 2016-06-21 ENCOUNTER — Ambulatory Visit (INDEPENDENT_AMBULATORY_CARE_PROVIDER_SITE_OTHER): Payer: 59 | Admitting: *Deleted

## 2016-06-21 DIAGNOSIS — I428 Other cardiomyopathies: Secondary | ICD-10-CM

## 2016-06-21 NOTE — Progress Notes (Signed)
Remote pacemaker transmission.   

## 2016-06-22 ENCOUNTER — Encounter: Payer: Self-pay | Admitting: Cardiology

## 2016-06-22 DIAGNOSIS — K529 Noninfective gastroenteritis and colitis, unspecified: Secondary | ICD-10-CM | POA: Diagnosis not present

## 2016-06-22 LAB — CUP PACEART REMOTE DEVICE CHECK
Date Time Interrogation Session: 20180223132909
Implantable Lead Implant Date: 20151223
Implantable Lead Implant Date: 20151223
Implantable Lead Implant Date: 20151223
Implantable Lead Location: 753858
Implantable Lead Location: 753859
Implantable Lead Location: 753860
Implantable Pulse Generator Implant Date: 20151223
Pulse Gen Model: 3242
Pulse Gen Serial Number: 7701275

## 2016-06-26 MED FILL — PEG-3350 SOLUTION: 420 | 1 days supply | Qty: 4000 | Fill #0

## 2016-06-30 LAB — CUP PACEART REMOTE DEVICE CHECK
Battery Remaining Longevity: 90 mo
Battery Remaining Percentage: 95 %
Battery Voltage: 2.98 V
Brady Statistic RA Percent Paced: 6.9 %
Brady Statistic RV Percent Paced: 98 %
Date Time Interrogation Session: 20180223133142
Implantable Lead Implant Date: 20151223
Implantable Lead Implant Date: 20151223
Implantable Lead Implant Date: 20151223
Implantable Lead Location: 753858
Implantable Lead Location: 753859
Implantable Lead Location: 753860
Implantable Pulse Generator Implant Date: 20151223
Lead Channel Impedance Value: 440 Ohm
Lead Channel Impedance Value: 440 Ohm
Lead Channel Impedance Value: 810 Ohm
Lead Channel Pacing Threshold Amplitude: 0.875 V
Lead Channel Pacing Threshold Amplitude: 1.125 V
Lead Channel Pacing Threshold Amplitude: 1.25 V
Lead Channel Pacing Threshold Pulse Width: 0.4 ms
Lead Channel Pacing Threshold Pulse Width: 0.4 ms
Lead Channel Pacing Threshold Pulse Width: 0.7 ms
Lead Channel Sensing Intrinsic Amplitude: 3.5 mV
Lead Channel Sensing Intrinsic Amplitude: 8.8 mV
Lead Channel Setting Pacing Amplitude: 1.875
Lead Channel Setting Pacing Amplitude: 2.125
Lead Channel Setting Pacing Amplitude: 2.25 V
Lead Channel Setting Pacing Pulse Width: 0.4 ms
Lead Channel Setting Pacing Pulse Width: 0.7 ms
Lead Channel Setting Sensing Sensitivity: 2 mV
Pulse Gen Model: 3242
Pulse Gen Serial Number: 7701275

## 2016-07-05 DIAGNOSIS — K5730 Diverticulosis of large intestine without perforation or abscess without bleeding: Secondary | ICD-10-CM | POA: Diagnosis not present

## 2016-07-05 DIAGNOSIS — K529 Noninfective gastroenteritis and colitis, unspecified: Secondary | ICD-10-CM | POA: Diagnosis not present

## 2016-07-05 DIAGNOSIS — R197 Diarrhea, unspecified: Secondary | ICD-10-CM | POA: Diagnosis not present

## 2016-07-05 MED FILL — CITALOPRAM HBR 10 MG TABLET: 10 | 32 days supply | Qty: 16 | Fill #5

## 2016-07-05 MED FILL — HYDROXYUREA 500 MG CAPSULE: 500 | 30 days supply | Qty: 90 | Fill #3 | Status: TO

## 2016-07-05 MED FILL — OMEPRAZOLE DR 20 MG CAPSULE: 20 | 30 days supply | Qty: 60 | Fill #2 | Status: TO

## 2016-07-05 MED FILL — ULORIC 40 MG TABLET: 40 | 30 days supply | Qty: 30 | Fill #3

## 2016-07-05 MED FILL — ENTRESTO 24 MG-26 MG TABLET: 24-26 | 30 days supply | Qty: 60 | Fill #3 | Status: TO

## 2016-07-05 MED FILL — CARVEDILOL 12.5 MG TABLET: 12.5 | 30 days supply | Qty: 60 | Fill #4 | Status: TO

## 2016-07-11 DIAGNOSIS — K529 Noninfective gastroenteritis and colitis, unspecified: Secondary | ICD-10-CM | POA: Diagnosis not present

## 2016-07-13 ENCOUNTER — Encounter: Payer: Self-pay | Admitting: Hematology & Oncology

## 2016-07-13 ENCOUNTER — Ambulatory Visit (HOSPITAL_BASED_OUTPATIENT_CLINIC_OR_DEPARTMENT_OTHER): Payer: Medicare Other | Admitting: Hematology & Oncology

## 2016-07-13 ENCOUNTER — Other Ambulatory Visit (HOSPITAL_BASED_OUTPATIENT_CLINIC_OR_DEPARTMENT_OTHER): Payer: Medicare Other

## 2016-07-13 VITALS — BP 140/67 | HR 82 | Temp 97.8°F | Resp 24 | Ht 70.0 in | Wt 303.0 lb

## 2016-07-13 DIAGNOSIS — D45 Polycythemia vera: Secondary | ICD-10-CM

## 2016-07-13 LAB — CMP (CANCER CENTER ONLY)
ALT(SGPT): 38 U/L (ref 10–47)
AST: 33 U/L (ref 11–38)
Albumin: 3.5 g/dL (ref 3.3–5.5)
Alkaline Phosphatase: 85 U/L — ABNORMAL HIGH (ref 26–84)
BUN, Bld: 13 mg/dL (ref 7–22)
CO2: 24 mEq/L (ref 18–33)
Calcium: 8.5 mg/dL (ref 8.0–10.3)
Chloride: 105 mEq/L (ref 98–108)
Creat: 1.2 mg/dl (ref 0.6–1.2)
Glucose, Bld: 176 mg/dL — ABNORMAL HIGH (ref 73–118)
Potassium: 3.9 mEq/L (ref 3.3–4.7)
Sodium: 143 mEq/L (ref 128–145)
Total Bilirubin: 0.7 mg/dl (ref 0.20–1.60)
Total Protein: 6.9 g/dL (ref 6.4–8.1)

## 2016-07-13 LAB — LACTATE DEHYDROGENASE: LDH: 202 U/L (ref 125–245)

## 2016-07-13 LAB — CBC WITH DIFFERENTIAL (CANCER CENTER ONLY)
BASO#: 0.2 10*3/uL (ref 0.0–0.2)
BASO%: 2 % (ref 0.0–2.0)
EOS%: 3.1 % (ref 0.0–7.0)
Eosinophils Absolute: 0.3 10*3/uL (ref 0.0–0.5)
HCT: 43 % (ref 38.7–49.9)
HGB: 13.7 g/dL (ref 13.0–17.1)
LYMPH#: 1.9 10*3/uL (ref 0.9–3.3)
LYMPH%: 19.9 % (ref 14.0–48.0)
MCH: 29.4 pg (ref 28.0–33.4)
MCHC: 31.9 g/dL — ABNORMAL LOW (ref 32.0–35.9)
MCV: 92 fL (ref 82–98)
MONO#: 1 10*3/uL — ABNORMAL HIGH (ref 0.1–0.9)
MONO%: 10.8 % (ref 0.0–13.0)
NEUT#: 6 10*3/uL (ref 1.5–6.5)
NEUT%: 64.2 % (ref 40.0–80.0)
Platelets: 396 10*3/uL (ref 145–400)
RBC: 4.66 10*6/uL (ref 4.20–5.70)
RDW: 15.7 % (ref 11.1–15.7)
WBC: 9.3 10*3/uL (ref 4.0–10.0)

## 2016-07-13 NOTE — Progress Notes (Signed)
Hematology and Oncology Follow Up Visit  Dvonte Gatliff [id number] [birth year] 66 y.o. 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  Aspirin 81 mg by mouth daily  Phlebotomy to maintain hematocrit below 45%     Interim History:  Mr.  BCullersis back for followup. He is doing okay. He was hospitalized back in January with C. difficile. Because of the C. difficile, he had some renal insufficiency. He was only hospitalized for 3 days.  He developed the C. difficile after being on some antibiotics for a urinary tract infection.  His carpal tunnel surgery worked very well.  He still has the back issues.  He has not had any problems with nausea or vomiting. He's had no rashes. He's had no leg swelling.  He recently had a colonoscopy. He is not sure what the results are.  He still having some loose stool. A much or what could be causing this. He might be having issues with the medications that he is taking.  Overall, his performance status is ECOG 1..   Medications:  Current Outpatient Prescriptions:  .  aspirin 81 MG tablet, Take 81 mg by mouth daily., Disp: , Rfl:  .  carvedilol (COREG) 12.5 MG tablet, TAKE 1 TABLET (12.5 MG TOTAL) BY MOUTH 2 (TWO) TIMES DAILY WITH A MEAL., Disp: 60 tablet, Rfl: 6 .  citalopram (CELEXA) 10 MG tablet, Take 5 mg by mouth daily. , Disp: , Rfl:  .  furosemide (LASIX) 20 MG tablet, Take 1 tablet (20 mg total) by mouth daily., Disp: 90 tablet, Rfl: 2 .  hydroxyurea (HYDREA) 500 MG capsule, Take 500-1,000 mg by mouth See admin instructions. Take one tablet by mouth every other day and two tablets by mouth the other days. , Disp: , Rfl:  .  Melatonin 5 MG TABS, Take 5 mg by mouth at bedtime as needed (sleep)., Disp: , Rfl:  .  polyvinyl alcohol (LIQUIFILM TEARS) 1.4 % ophthalmic solution, Place 1-2 drops into both eyes as needed for dry  eyes., Disp: , Rfl:  .  sacubitril-valsartan (ENTRESTO) 24-26 MG, Take 1 tablet by mouth 2 (two) times daily., Disp: 60 tablet, Rfl: 12 .  silodosin (RAPAFLO) 8 MG CAPS capsule, Take 8 mg by mouth daily. , Disp: , Rfl:  .  spironolactone (ALDACTONE) 25 MG tablet, TAKE 1/2 TABLET BY MOUTH DAILY, Disp: 45 tablet, Rfl: 3 .  triamcinolone (NASACORT ALLERGY 24HR) 55 MCG/ACT AERO nasal inhaler, Place 2 sprays into the nose daily., Disp: , Rfl:  .  ULORIC 40 MG tablet, TAKE 1 TABLET BY MOUTH DAILY., Disp: 30 tablet, Rfl: 6 No current facility-administered medications for this visit.   Facility-Administered Medications Ordered in Other Visits:  .  0.9 %  sodium chloride infusion, , Intravenous, Continuous, PVolanda Napoleon MD, Stopped at 05/16/13 1115  Allergies:  Allergies  Allergen Reactions  . Prednisone Itching and Other (See Comments)    Abdominal pain  . Prozac [Fluoxetine Hcl] Itching  . Temazepam Other (See Comments)    Dizziness   . Trazodone And Nefazodone Other (See Comments)    Dizziness  . Ibuprofen Itching  . Wellbutrin [Bupropion] Hives    Past Medical History, Surgical history, Social history, and Family History were reviewed and updated.  Review of Systems: As above  Physical Exam:  height is 5' 10"  (1.778 m) and weight is 303 lb (  137.4 kg) (abnormal). His oral temperature is 97.8 F (36.6 C). His blood pressure is 140/67 and his pulse is 82. His respiration is 24 (abnormal).   Obese white male in no obvious distress. There is no adenopathy in the neck. Lungs are clear. Cardiac exam regular rate and rhythm. He has an occasional extra beat. He has no murmurs, rubs or bruits. Abdomen is obese but soft. Has good bowel sounds. There is no fluid wave. There is a palpable liver or spleen tip. Back exam shows a well healed laminectomy scar in the lumbar spine. Extremities shows no clubbing cyanosis or edema. He has good range of motion of his joints. He has good strength. Skin  exam no rashes, ecchymoses or petechia. Neurological exam is nonfocal.  Lab Results  Component Value Date   WBC 9.3 07/13/2016   HGB 13.7 07/13/2016   HCT 43.0 07/13/2016   MCV 92 07/13/2016   PLT 396 07/13/2016     Chemistry      Component Value Date/Time   NA 143 07/13/2016 0811   NA 140 04/05/2016 0739   K 3.9 07/13/2016 0811   K 4.2 04/05/2016 0739   CL 105 07/13/2016 0811   CO2 24 07/13/2016 0811   CO2 20 (L) 04/05/2016 0739   BUN 13 07/13/2016 0811   BUN 14.7 04/05/2016 0739   CREATININE 1.2 07/13/2016 0811   CREATININE 1.1 04/05/2016 0739      Component Value Date/Time   CALCIUM 8.5 07/13/2016 0811   CALCIUM 9.0 04/05/2016 0739   ALKPHOS 85 (H) 07/13/2016 0811   ALKPHOS 91 04/05/2016 0739   AST 33 07/13/2016 0811   AST 34 04/05/2016 0739   ALT 38 07/13/2016 0811   ALT 40 04/05/2016 0739   BILITOT 0.70 07/13/2016 0811   BILITOT 0.48 04/05/2016 0739         Impression and Plan: Mr. Schwinn is 66 year old gentleman with polycythemia vera.   He looks good. He does not need any phlebotomy area did  He certainly did not lose any weight while he had this C. difficile infection.  I will like to see him back in about 2 months. At that point, he might need to be phlebotomized.  I told him that his sleep apnea really needs to be treated. He does have the CPAP but apparently he cannot breathe with the nasal apparatus that they have given him. I told him that this is where the problems can arise and that with oxygen depravation, his heart could go into a bad rhythm and hopefully the pacemaker will prevent that   Volanda Napoleon, MD 3/8/20188:52 AM

## 2016-07-19 DIAGNOSIS — G4733 Obstructive sleep apnea (adult) (pediatric): Secondary | ICD-10-CM | POA: Diagnosis not present

## 2016-07-19 DIAGNOSIS — I441 Atrioventricular block, second degree: Secondary | ICD-10-CM | POA: Diagnosis not present

## 2016-07-19 DIAGNOSIS — I5043 Acute on chronic combined systolic (congestive) and diastolic (congestive) heart failure: Secondary | ICD-10-CM | POA: Diagnosis not present

## 2016-07-19 DIAGNOSIS — R8290 Unspecified abnormal findings in urine: Secondary | ICD-10-CM | POA: Diagnosis not present

## 2016-07-19 DIAGNOSIS — I428 Other cardiomyopathies: Secondary | ICD-10-CM | POA: Diagnosis not present

## 2016-07-19 DIAGNOSIS — N644 Mastodynia: Secondary | ICD-10-CM | POA: Diagnosis not present

## 2016-07-19 DIAGNOSIS — M109 Gout, unspecified: Secondary | ICD-10-CM | POA: Diagnosis not present

## 2016-07-19 DIAGNOSIS — N183 Chronic kidney disease, stage 3 (moderate): Secondary | ICD-10-CM | POA: Diagnosis not present

## 2016-07-19 DIAGNOSIS — E1122 Type 2 diabetes mellitus with diabetic chronic kidney disease: Secondary | ICD-10-CM | POA: Diagnosis not present

## 2016-07-19 DIAGNOSIS — Z0000 Encounter for general adult medical examination without abnormal findings: Secondary | ICD-10-CM | POA: Diagnosis not present

## 2016-07-19 DIAGNOSIS — M545 Low back pain: Secondary | ICD-10-CM | POA: Diagnosis not present

## 2016-07-19 DIAGNOSIS — Z950 Presence of cardiac pacemaker: Secondary | ICD-10-CM | POA: Diagnosis not present

## 2016-07-21 ENCOUNTER — Other Ambulatory Visit (HOSPITAL_BASED_OUTPATIENT_CLINIC_OR_DEPARTMENT_OTHER): Payer: Self-pay | Admitting: Family Medicine

## 2016-07-21 DIAGNOSIS — R109 Unspecified abdominal pain: Secondary | ICD-10-CM

## 2016-07-21 DIAGNOSIS — M549 Dorsalgia, unspecified: Secondary | ICD-10-CM

## 2016-07-22 ENCOUNTER — Encounter (HOSPITAL_BASED_OUTPATIENT_CLINIC_OR_DEPARTMENT_OTHER): Payer: Self-pay

## 2016-07-22 ENCOUNTER — Ambulatory Visit (HOSPITAL_BASED_OUTPATIENT_CLINIC_OR_DEPARTMENT_OTHER)
Admission: RE | Admit: 2016-07-22 | Discharge: 2016-07-22 | Disposition: A | Discharge status: Home / Self Care | Payer: 59 | Source: Ambulatory Visit | Attending: Family Medicine | Admitting: Family Medicine

## 2016-07-22 DIAGNOSIS — M5136 Other intervertebral disc degeneration, lumbar region: Secondary | ICD-10-CM | POA: Diagnosis not present

## 2016-07-22 DIAGNOSIS — R109 Unspecified abdominal pain: Secondary | ICD-10-CM | POA: Diagnosis present

## 2016-07-22 DIAGNOSIS — K5730 Diverticulosis of large intestine without perforation or abscess without bleeding: Secondary | ICD-10-CM | POA: Diagnosis not present

## 2016-07-22 DIAGNOSIS — I700 Atherosclerosis of aorta: Secondary | ICD-10-CM | POA: Insufficient documentation

## 2016-07-22 DIAGNOSIS — N400 Enlarged prostate without lower urinary tract symptoms: Secondary | ICD-10-CM | POA: Insufficient documentation

## 2016-07-22 DIAGNOSIS — M549 Dorsalgia, unspecified: Secondary | ICD-10-CM

## 2016-07-22 MED ORDER — IOPAMIDOL (ISOVUE-300) INJECTION 61%
100.0000 mL | Freq: Once | INTRAVENOUS | Status: AC | PRN
  Administered 2016-07-22: 100 mL via INTRAVENOUS

## 2016-07-24 MED FILL — AMOX-CLAV 500-125 MG TABLET: 500-125 | 7 days supply | Qty: 14 | Fill #0

## 2016-07-28 NOTE — Progress Notes (Signed)
HPI: FU cardiomyopathy/CHF. Echocardiogram in August of 2013 showed an ejection fraction of 35-40%. Cardiac catheterization in September of 2013 showed mild nonobstructive coronary disease. There was no hemodynamic evidence of restriction. Pulmonary capillary wedge pressure 10. Cardiac MRI in September of 2013 showed an ejection fraction of 34% with diffuse hypokinesis. There was no hyperenhancement or scar tissue and no evidence of cardiac hemochromatosis. TSH in September 2013 normal. 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 Patient subsequently had CRTP placed. Echo 2/16 showed EF 56-25, grade 1 diastolic dysfunction, moderate LAE. Nuclear study March 2017 showed ejection fraction 32. There was prior inferior infarct but no ischemia. Treated medically as felt likely attenuation artifact. Had C. difficile colitis January 2018 associated with acute renal failure. Since last seen, patient has dyspnea on exertion which is unchanged. No orthopnea, PND, pedal edema, chest pain or syncope.  Current Outpatient Prescriptions  Medication Sig Dispense Refill  . aspirin 81 MG tablet Take 81 mg by mouth daily.    . carvedilol (COREG) 12.5 MG tablet TAKE 1 TABLET (12.5 MG TOTAL) BY MOUTH 2 (TWO) TIMES DAILY WITH A MEAL. 60 tablet 6  . citalopram (CELEXA) 10 MG tablet Take 5 mg by mouth daily.     . furosemide (LASIX) 20 MG tablet Take 1 tablet (20 mg total) by mouth daily. 90 tablet 2  . hydroxyurea (HYDREA) 500 MG capsule Take 500-1,000 mg by mouth See admin instructions. Take one tablet by mouth every other day and two tablets by mouth the other days.     . mesalamine (APRISO) 0.375 g 24 hr capsule Take 375 mg by mouth as directed.    [name] omeprazole (PRILOSEC) 20 MG capsule Take 20 mg by mouth 2 (two) times daily before a meal.     . Probiotic Product (ALIGN PO) Take by mouth as directed.    . sacubitril-valsartan (ENTRESTO) 24-26 MG Take 1 tablet by mouth 2 (two) times daily. 60  tablet 12  . silodosin (RAPAFLO) 8 MG CAPS capsule Take 8 mg by mouth daily.     [name] spironolactone (ALDACTONE) 25 MG tablet TAKE 1/2 TABLET BY MOUTH DAILY 45 tablet 3  . ULORIC 40 MG tablet TAKE 1 TABLET BY MOUTH DAILY. 30 tablet 6   No current facility-administered medications for this visit.    Facility-Administered Medications Ordered in Other Visits  Medication Dose Route Frequency Provider Last Rate Last Dose  . 0.9 %  sodium chloride infusion   Intravenous Continuous Volanda Napoleon, MD   Stopped at 05/16/13 1115     Past Medical History:  Diagnosis Date  . AV block, 2nd degree 2015   St. Jude Allure Quadra pulse generator [id number], model PM [id number]  . Back pain   . Cardiomyopathy ([place])    Nonischemic 45%.   . CHF (congestive heart failure) ([place])   . Gout   . Hemochromatosis   . Hypertension   . Hypospadias Jan 15, 1951   born with  . Nephrolithiasis   . Polycythemia vera(238.4)     Past Surgical History:  Procedure Laterality Date  . ANKLE SURGERY    . BI-VENTRICULAR PACEMAKER INSERTION N/A 04/29/2014   Procedure: BI-VENTRICULAR PACEMAKER INSERTION (CRT-P);  Surgeon: Deboraha Sprang, MD; Laterality: Left  St. Jude Allure Quadra pulse generator [id number], model PM [phone]  . PILONIDAL CYST EXCISION    . POSTERIOR LAMINECTOMY / DECOMPRESSION LUMBAR SPINE    . TONSILLECTOMY      Social History  Social History  . Marital status: Married    Spouse name: N/A  . Number of children: N/A  . Years of education: N/A   Occupational History  . Not on file.   Social History Main Topics  . Smoking status: Former Smoker    Packs/day: 1.00    Years: 44.00    Types: Cigarettes    Start date: 06/05/1968    Quit date: 04/05/2013  . Smokeless tobacco: Never Used     Comment: quit 2 years ago  . Alcohol use 0.0 oz/week     Comment: Rare - once per month  . Drug use: No  . Sexual activity: Not on file   Other Topics Concern  . Not on file   Social History Narrative   Lives at  home with wife in a one story home.  Has no children.  Does not work.  Getting workman's comp.  Education: 4 years trade school.     Family History  Problem Relation Age of Onset  . Heart disease Maternal Grandmother     Pacemaker, MI  . Stroke Mother   . Other Father     Deceased, car fell on him  . Diabetes Sister   . Hypertension Sister     ROS: Chronic back pain but no fevers or chills, productive cough, hemoptysis, dysphasia, odynophagia, melena, hematochezia, dysuria, hematuria, rash, seizure activity, orthopnea, PND, pedal edema, claudication. Remaining systems are negative.  Physical Exam: Well-developed morbidly obese in no acute distress.  Skin is warm and dry.  HEENT is normal.  Neck is supple.  Chest is clear to auscultation with normal expansion.  Cardiovascular exam is regular rate and rhythm.  Abdominal exam nontender or distended. No masses palpated. Extremities show no edema. neuro grossly intact   A/P  1 Nonischemic cardiomyopathy-continue carvedilol and entresto. Check echo to reassess LV function.  2 CRT-followed by electrophysiology.  3 hypertension-blood pressure controlled. Continue present medications.  4 morbid obesity-we discussed the importance of weight loss.  5 chronic systolic congestive heart failure-continue present dose of diuretics. Euvolemic on examination.  6 back pain-patient is contemplating a back stimulator and ask about whether this would be a problem with his pacemaker. I will review with electrophysiology.  Kirk Ruths, MD

## 2016-08-01 DIAGNOSIS — N390 Urinary tract infection, site not specified: Secondary | ICD-10-CM | POA: Diagnosis not present

## 2016-08-02 ENCOUNTER — Ambulatory Visit (INDEPENDENT_AMBULATORY_CARE_PROVIDER_SITE_OTHER): Payer: 59 | Admitting: Cardiology

## 2016-08-02 ENCOUNTER — Encounter: Payer: Self-pay | Admitting: Cardiology

## 2016-08-02 ENCOUNTER — Telehealth: Payer: Self-pay | Admitting: *Deleted

## 2016-08-02 VITALS — BP 138/88 | HR 84 | Ht 70.0 in | Wt 303.0 lb

## 2016-08-02 DIAGNOSIS — I429 Cardiomyopathy, unspecified: Secondary | ICD-10-CM | POA: Diagnosis not present

## 2016-08-02 DIAGNOSIS — I10 Essential (primary) hypertension: Secondary | ICD-10-CM

## 2016-08-02 DIAGNOSIS — I5022 Chronic systolic (congestive) heart failure: Secondary | ICD-10-CM | POA: Diagnosis not present

## 2016-08-02 MED ORDER — CARVEDILOL 12.5 MG PO TABS: 12.5000 mg | ORAL_TABLET | Freq: Two times a day (BID) | ORAL | 3 refills | Status: DC

## 2016-08-02 NOTE — Telephone Encounter (Signed)
-----   Message from Cristopher Estimable, RN sent at 08/02/2016 10:29 AM EDT ----- Please check to see if this patient can use a stimulator for back discomfort with his pacemaker. His appointment is next week so he needs to know asap please. Please call the patient with that information. Thank you

## 2016-08-02 NOTE — Telephone Encounter (Signed)
Called patient about stimulator. Patient states that his appt is on 4/4 @ 1300. I informed patient that I would send industry to be there for his appt to make sure that there is no interference with the ppm. Patient voiced understanding.  Bryan Small, STJ aware of appt.

## 2016-08-02 NOTE — Patient Instructions (Signed)
Medication Instructions:   NO CHANGE  Testing/Procedures:  Your physician has requested that you have an echocardiogram. Echocardiography is a painless test that uses sound waves to create images of your heart. It provides your doctor with information about the size and shape of your heart and how well your heart's chambers and valves are working. This procedure takes approximately one hour. There are no restrictions for this procedure.    Follow-Up:  Your physician wants you to follow-up in: 6 MONTHS WITH DR CRENSHAW You will receive a reminder letter in the mail two months in advance. If you don't receive a letter, please call our office to schedule the follow-up appointment.    If you need a refill on your cardiac medications before your next appointment, please call your pharmacy.   

## 2016-08-03 ENCOUNTER — Other Ambulatory Visit: Payer: Self-pay | Admitting: *Deleted

## 2016-08-03 DIAGNOSIS — I429 Cardiomyopathy, unspecified: Secondary | ICD-10-CM

## 2016-08-03 MED ORDER — HYDROXYUREA 500 MG PO CAPS: 500.0000 mg | ORAL_CAPSULE | ORAL | 2 refills | Status: DC

## 2016-08-03 NOTE — Telephone Encounter (Signed)
error 

## 2016-08-09 ENCOUNTER — Encounter: Payer: Self-pay | Admitting: Cardiology

## 2016-08-09 NOTE — Telephone Encounter (Signed)
Left message for pt to call.

## 2016-08-09 NOTE — Telephone Encounter (Signed)
Please call.

## 2016-08-10 ENCOUNTER — Telehealth: Payer: Self-pay | Admitting: Cardiology

## 2016-08-10 DIAGNOSIS — K529 Noninfective gastroenteritis and colitis, unspecified: Secondary | ICD-10-CM | POA: Diagnosis not present

## 2016-08-10 NOTE — Telephone Encounter (Signed)
Spoke with pt, he went to the spine doctor yesterday and was not able to have the procedure done because the rep never showed up. They are interested in getting it rescheduled but want to make sure everything is order prior to going. Called the device clinic and ask for them to contact the patient regarding rescheduling his appt and getting the rep there.

## 2016-08-10 NOTE — Telephone Encounter (Signed)
New Message     Pt wife would like to speak to Hilda Blades about the procedure her husband was to have done yesterday

## 2016-08-10 NOTE — Telephone Encounter (Signed)
This encounter was created in error - please disregard.

## 2016-08-10 NOTE — Telephone Encounter (Signed)
Spoke w/ pt and informed him that when his appointment is scheduled to call our office (direct number provider) and we will make sure industry is at appointment. Pt verbalized understanding.

## 2016-08-17 ENCOUNTER — Ambulatory Visit (HOSPITAL_BASED_OUTPATIENT_CLINIC_OR_DEPARTMENT_OTHER)
Admission: RE | Admit: 2016-08-17 | Discharge: 2016-08-17 | Disposition: A | Discharge status: Home / Self Care | Payer: Medicare Other | Source: Ambulatory Visit | Attending: Cardiology | Admitting: Cardiology

## 2016-08-17 DIAGNOSIS — Z87891 Personal history of nicotine dependence: Secondary | ICD-10-CM | POA: Insufficient documentation

## 2016-08-17 DIAGNOSIS — I119 Hypertensive heart disease without heart failure: Secondary | ICD-10-CM | POA: Diagnosis not present

## 2016-08-17 DIAGNOSIS — I428 Other cardiomyopathies: Secondary | ICD-10-CM | POA: Insufficient documentation

## 2016-08-17 DIAGNOSIS — Z6841 Body Mass Index (BMI) 40.0 and over, adult: Secondary | ICD-10-CM | POA: Insufficient documentation

## 2016-08-17 DIAGNOSIS — I429 Cardiomyopathy, unspecified: Secondary | ICD-10-CM

## 2016-08-17 MED ORDER — PERFLUTREN LIPID MICROSPHERE
1.0000 mL | INTRAVENOUS | Status: AC | PRN
  Administered 2016-08-17: 2 mL via INTRAVENOUS
  Filled 2016-08-17: qty 10

## 2016-08-17 NOTE — Progress Notes (Signed)
  Echocardiogram 2D Echocardiogram with Definity has been performed.  William Villanueva 08/17/2016, 11:17 AM

## 2016-08-18 MED FILL — Perflutren Lipid Microsphere IV Susp 1.1 MG/ML: INTRAVENOUS | Qty: 10 | Status: AC

## 2016-09-15 DIAGNOSIS — I00 Rheumatic fever without heart involvement: Secondary | ICD-10-CM | POA: Diagnosis not present

## 2016-09-20 ENCOUNTER — Ambulatory Visit (INDEPENDENT_AMBULATORY_CARE_PROVIDER_SITE_OTHER): Payer: Medicare Other | Admitting: *Deleted

## 2016-09-20 DIAGNOSIS — I429 Cardiomyopathy, unspecified: Secondary | ICD-10-CM

## 2016-09-20 DIAGNOSIS — I5032 Chronic diastolic (congestive) heart failure: Secondary | ICD-10-CM

## 2016-09-20 NOTE — Progress Notes (Signed)
Remote pacemaker transmission.   

## 2016-09-21 ENCOUNTER — Encounter: Payer: Self-pay | Admitting: Cardiology

## 2016-09-21 LAB — CUP PACEART REMOTE DEVICE CHECK
Battery Remaining Longevity: 94 mo
Battery Remaining Percentage: 95.5 %
Battery Voltage: 2.98 V
Brady Statistic AP VP Percent: 7.3 %
Brady Statistic AP VS Percent: 1 %
Brady Statistic AS VP Percent: 91 %
Brady Statistic AS VS Percent: 1 %
Brady Statistic RA Percent Paced: 6.1 %
Date Time Interrogation Session: 20180516071643
Implantable Lead Implant Date: 20151223
Implantable Lead Implant Date: 20151223
Implantable Lead Implant Date: 20151223
Implantable Lead Location: 753858
Implantable Lead Location: 753859
Implantable Lead Location: 753860
Implantable Pulse Generator Implant Date: 20151223
Lead Channel Impedance Value: 450 Ohm
Lead Channel Impedance Value: 460 Ohm
Lead Channel Impedance Value: 830 Ohm
Lead Channel Pacing Threshold Amplitude: 0.875 V
Lead Channel Pacing Threshold Amplitude: 1 V
Lead Channel Pacing Threshold Amplitude: 1.125 V
Lead Channel Pacing Threshold Pulse Width: 0.4 ms
Lead Channel Pacing Threshold Pulse Width: 0.4 ms
Lead Channel Pacing Threshold Pulse Width: 0.7 ms
Lead Channel Sensing Intrinsic Amplitude: 10.1 mV
Lead Channel Sensing Intrinsic Amplitude: 3.6 mV
Lead Channel Setting Pacing Amplitude: 1.875
Lead Channel Setting Pacing Amplitude: 2 V
Lead Channel Setting Pacing Amplitude: 2.125
Lead Channel Setting Pacing Pulse Width: 0.4 ms
Lead Channel Setting Pacing Pulse Width: 0.7 ms
Lead Channel Setting Sensing Sensitivity: 2 mV
Pulse Gen Model: 3242
Pulse Gen Serial Number: 7701275

## 2016-09-27 ENCOUNTER — Ambulatory Visit (HOSPITAL_BASED_OUTPATIENT_CLINIC_OR_DEPARTMENT_OTHER): Payer: Medicare Other | Admitting: Hematology & Oncology

## 2016-09-27 ENCOUNTER — Other Ambulatory Visit (HOSPITAL_BASED_OUTPATIENT_CLINIC_OR_DEPARTMENT_OTHER): Payer: Medicare Other

## 2016-09-27 ENCOUNTER — Ambulatory Visit (HOSPITAL_BASED_OUTPATIENT_CLINIC_OR_DEPARTMENT_OTHER): Payer: Medicare Other

## 2016-09-27 VITALS — BP 114/64 | HR 74 | Resp 20

## 2016-09-27 VITALS — BP 130/56 | HR 72 | Temp 98.1°F | Resp 20 | Wt 300.4 lb

## 2016-09-27 DIAGNOSIS — D45 Polycythemia vera: Secondary | ICD-10-CM | POA: Diagnosis not present

## 2016-09-27 DIAGNOSIS — D473 Essential (hemorrhagic) thrombocythemia: Secondary | ICD-10-CM

## 2016-09-27 LAB — CMP (CANCER CENTER ONLY)
ALT(SGPT): 46 U/L (ref 10–47)
AST: 34 U/L (ref 11–38)
Albumin: 3.7 g/dL (ref 3.3–5.5)
Alkaline Phosphatase: 92 U/L — ABNORMAL HIGH (ref 26–84)
BUN, Bld: 18 mg/dL (ref 7–22)
CO2: 26 mEq/L (ref 18–33)
Calcium: 9 mg/dL (ref 8.0–10.3)
Chloride: 100 mEq/L (ref 98–108)
Creat: 1.3 mg/dl — ABNORMAL HIGH (ref 0.6–1.2)
Glucose, Bld: 141 mg/dL — ABNORMAL HIGH (ref 73–118)
Potassium: 4.2 mEq/L (ref 3.3–4.7)
Sodium: 134 mEq/L (ref 128–145)
Total Bilirubin: 0.9 mg/dl (ref 0.20–1.60)
Total Protein: 7.5 g/dL (ref 6.4–8.1)

## 2016-09-27 LAB — CBC WITH DIFFERENTIAL (CANCER CENTER ONLY)
BASO#: 0.3 10*3/uL — ABNORMAL HIGH (ref 0.0–0.2)
BASO%: 2.6 % — ABNORMAL HIGH (ref 0.0–2.0)
EOS%: 2.1 % (ref 0.0–7.0)
Eosinophils Absolute: 0.3 10*3/uL (ref 0.0–0.5)
HCT: 49.1 % (ref 38.7–49.9)
HGB: 15.4 g/dL (ref 13.0–17.1)
LYMPH#: 2 10*3/uL (ref 0.9–3.3)
LYMPH%: 16 % (ref 14.0–48.0)
MCH: 29 pg (ref 28.0–33.4)
MCHC: 31.4 g/dL — ABNORMAL LOW (ref 32.0–35.9)
MCV: 93 fL (ref 82–98)
MONO#: 1 10*3/uL — ABNORMAL HIGH (ref 0.1–0.9)
MONO%: 8.3 % (ref 0.0–13.0)
NEUT#: 8.6 10*3/uL — ABNORMAL HIGH (ref 1.5–6.5)
NEUT%: 71 % (ref 40.0–80.0)
Platelets: 470 10*3/uL — ABNORMAL HIGH (ref 145–400)
RBC: 5.31 10*6/uL (ref 4.20–5.70)
RDW: 14.9 % (ref 11.1–15.7)
WBC: 12.2 10*3/uL — ABNORMAL HIGH (ref 4.0–10.0)

## 2016-09-27 LAB — FERRITIN: Ferritin: 22 ng/ml (ref 22–316)

## 2016-09-27 LAB — IRON AND TIBC
%SAT: 10 % — ABNORMAL LOW (ref 20–55)
Iron: 43 ug/dL (ref 42–163)
TIBC: 449 ug/dL — ABNORMAL HIGH (ref 202–409)
UIBC: 406 ug/dL — ABNORMAL HIGH (ref 117–376)

## 2016-09-27 LAB — LACTATE DEHYDROGENASE: LDH: 236 U/L (ref 125–245)

## 2016-09-27 NOTE — Patient Instructions (Signed)
     Therapeutic Phlebotomy, Care After Refer to this sheet in the next few weeks. These instructions provide you with information about caring for yourself after your procedure. Your health care provider may also give you more specific instructions. Your treatment has been planned according to current medical practices, but problems sometimes occur. Call your health care provider if you have any problems or questions after your procedure. What can I expect after the procedure? After the procedure, it is common to have:  Light-headedness or dizziness. You may feel faint.  Nausea.  Tiredness. Follow these instructions at home: Activity  Return to your normal activities as directed by your health care provider. Most people can go back to their normal activities right away.  Avoid strenuous physical activity and heavy lifting or pulling for about 5 hours after the procedure. Do not lift anything that is heavier than 10 lb (4.5 kg).  Athletes should avoid strenuous exercise for at least 12 hours.  Change positions slowly for the remainder of the day. This will help to prevent light-headedness or fainting.  If you feel light-headed, lie down until the feeling goes away. Eating and drinking  Be sure to eat well-balanced meals for the next 24 hours.  Drink enough fluid to keep your urine clear or pale yellow.  Avoid drinking alcohol on the day that you had the procedure. Care of the Needle Insertion Site  Keep your bandage dry. You can remove the bandage after about 5 hours or as directed by your health care provider.  If you have bleeding from the needle insertion site, elevate your arm and press firmly on the site until the bleeding stops.  If you have bruising at the site, apply ice to the area:  Put ice in a plastic bag.  Place a towel between your skin and the bag.  Leave the ice on for 20 minutes, 2-3 times a day for the first 24 hours.  If the swelling does not go away  after 24 hours, apply a warm, moist washcloth to the area for 20 minutes, 2-3 times a day. General instructions  Avoid smoking for at least 30 minutes after the procedure.  Keep all follow-up visits as directed by your health care provider. It is important to continue with further therapeutic phlebotomy treatments as directed. Contact a health care provider if:  You have redness, swelling, or pain at the needle insertion site.  You have fluid, blood, or pus coming from the needle insertion site.  You feel light-headed, dizzy, or nauseated, and the feeling does not go away.  You notice new bruising at the needle insertion site.  You feel weaker than normal.  You have a fever or chills. Get help right away if:  You have severe nausea or vomiting.  You have chest pain.  You have trouble breathing. This information is not intended to replace advice given to you by your health care provider. Make sure you discuss any questions you have with your health care provider. Document Released: 09/26/2010 Document Revised: 12/25/2015 Document Reviewed: 04/20/2014 Elsevier Interactive Patient Education  2017 [employer]  

## 2016-09-27 NOTE — Progress Notes (Signed)
William Villanueva presents today for phlebotomy per MD orders. Phlebotomy procedure started at 0930 and ended at 0945. 500 grams removed. Patient observed for 30 minutes after procedure without any incident. Patient tolerated procedure well. IV needle removed intact.

## 2016-09-27 NOTE — Progress Notes (Signed)
Hematology and Oncology Follow Up Visit  William Villanueva [id number] [birth year] 66 y.o. 09/27/2016   Principle Diagnosis:   Polycythemia vera-JAK2 positive  Heart block-Mobitz II  Current Therapy:    Hydrea 510m po BID, alternate with 500 mg a day.  Aspirin 81 mg by mouth daily  Phlebotomy to maintain hematocrit below 45%     Interim History:  [name] back for followup. He is doing okay. He is not having any further issues with his back. His chronic back issues. A lot of this because of his weight. He does not exercise all that much.  He is doing well with the Hydrea. He's had no problems with nausea or vomiting. He's had no rashes. He has had no bleeding or bruising.  His last iron studies done back in November showed a ferritin level and 15 with iron saturation of 6%.  He's doing well with his pacemaker. This was placed back in December 2015. He is had no problems cardiac-wise.  His blood sugars have been on the higher side. His wife is doing her best job in trying to get him to watch what he eats.  It does not sound like they will be doing all that much this summer. It does not look like they will be traveling. They are very much involved with the care of their grandchildren. They really enjoy this.  Overall, his performance status is ECOG 1..   Medications:  Current Outpatient Prescriptions:  .  aspirin 81 MG tablet, Take 81 mg by mouth daily., Disp: , Rfl:  .  carvedilol (COREG) 12.5 MG tablet, Take 1 tablet (12.5 mg total) by mouth 2 (two) times daily with a meal., Disp: 180 tablet, Rfl: 3 .  citalopram (CELEXA) 10 MG tablet, Take 5 mg by mouth daily. , Disp: , Rfl:  .  furosemide (LASIX) 20 MG tablet, Take 1 tablet (20 mg total) by mouth daily., Disp: 90 tablet, Rfl: 2 .  hydroxyurea (HYDREA) 500 MG capsule, Take 1-2 capsules (500-1,000 mg total) by mouth See admin instructions. Take one tablet every other day and two tablets the other days., Disp: 45 capsule,  Rfl: 2 .  mesalamine (APRISO) 0.375 g 24 hr capsule, Take 375 mg by mouth as directed., Disp: , Rfl:  .  omeprazole (PRILOSEC) 20 MG capsule, Take 20 mg by mouth 2 (two) times daily before a meal. , Disp: , Rfl:  .  Probiotic Product (ALIGN PO), Take by mouth as directed., Disp: , Rfl:  .  sacubitril-valsartan (ENTRESTO) 24-26 MG, Take 1 tablet by mouth 2 (two) times daily., Disp: 60 tablet, Rfl: 12 .  silodosin (RAPAFLO) 8 MG CAPS capsule, Take 8 mg by mouth daily. , Disp: , Rfl:  .  spironolactone (ALDACTONE) 25 MG tablet, TAKE 1/2 TABLET BY MOUTH DAILY, Disp: 45 tablet, Rfl: 3 .  traMADol (ULTRAM) 50 MG tablet, , Disp: , Rfl:  .  ULORIC 40 MG tablet, TAKE 1 TABLET BY MOUTH DAILY., Disp: 30 tablet, Rfl: 6 No current facility-administered medications for this visit.   Facility-Administered Medications Ordered in Other Visits:  .  0.9 %  sodium chloride infusion, , Intravenous, Continuous, William Villanueva, PRudell Cobb MD, Stopped at 05/16/13 1115  Allergies:  Allergies  Allergen Reactions  . Prednisone Itching and Other (See Comments)    Abdominal pain  . Prozac [Fluoxetine Hcl] Itching  . Temazepam Other (See Comments)    Dizziness   . Trazodone And Nefazodone Other (See Comments)    Dizziness  .  Ibuprofen Itching  . Wellbutrin [Bupropion] Hives    Past Medical History, Surgical history, Social history, and Family History were reviewed and updated.  Review of Systems: As above  Physical Exam:  weight is 300 lb 6.4 oz (136.3 kg) (abnormal). His oral temperature is 98.1 F (36.7 C). His blood pressure is 130/56 (abnormal) and his pulse is 72. His respiration is 20 and oxygen saturation is 94%.   Obese white male in no obvious distress. There is no adenopathy in the neck. Lungs are clear. Cardiac exam regular rate and rhythm. He has an occasional extra beat. He has no murmurs, rubs or bruits. Abdomen is obese but soft. Has good bowel sounds. There is no fluid wave. There is a palpable liver  or spleen tip. Back exam shows a well healed laminectomy scar in the lumbar spine. Extremities shows no clubbing cyanosis or edema. He has good range of motion of his joints. He has good strength. Skin exam no rashes, ecchymoses or petechia. Neurological exam is nonfocal.  Lab Results  Component Value Date   WBC 12.2 (H) 09/27/2016   HGB 15.4 09/27/2016   HCT 49.1 09/27/2016   MCV 93 09/27/2016   PLT 470 Large platelets present (H) 09/27/2016     Chemistry      Component Value Date/Time   NA 134 09/27/2016 0757   NA 140 04/05/2016 0739   K 4.2 09/27/2016 0757   K 4.2 04/05/2016 0739   CL 100 09/27/2016 0757   CO2 26 09/27/2016 0757   CO2 20 (L) 04/05/2016 0739   BUN 18 09/27/2016 0757   BUN 14.7 04/05/2016 0739   CREATININE 1.3 (H) 09/27/2016 0757   CREATININE 1.1 04/05/2016 0739      Component Value Date/Time   CALCIUM 9.0 09/27/2016 0757   CALCIUM 9.0 04/05/2016 0739   ALKPHOS 92 (H) 09/27/2016 0757   ALKPHOS 91 04/05/2016 0739   AST 34 09/27/2016 0757   AST 34 04/05/2016 0739   ALT 46 09/27/2016 0757   ALT 40 04/05/2016 0739   BILITOT 0.90 09/27/2016 0757   BILITOT 0.48 04/05/2016 0739         Impression and Plan: William Villanueva is 66 year old gentleman with polycythemia vera.   We will have to phlebotomize him. He has been quite a while since we phlebotomized him. His blood count just is too high.  I will not adjust his Hydrea dose for right now.  I will like to see him back in 6 weeks.  Given that he is JAK2 positive, we may want to consider JAKAFI depending on how well her how often he has to be phlebotomized.  I think that if he did use his CPAP machine, this may help. However, there are issues with him trying to breathe with the CPAP. Maybe his pulmonary doctor or family doctor can help with this.  Volanda Napoleon, MD 5/23/20189:05 AM

## 2016-10-16 DIAGNOSIS — R3915 Urgency of urination: Secondary | ICD-10-CM | POA: Diagnosis not present

## 2016-10-22 DIAGNOSIS — H9203 Otalgia, bilateral: Secondary | ICD-10-CM | POA: Diagnosis not present

## 2016-10-22 DIAGNOSIS — H6691 Otitis media, unspecified, right ear: Secondary | ICD-10-CM | POA: Diagnosis not present

## 2016-10-22 DIAGNOSIS — J029 Acute pharyngitis, unspecified: Secondary | ICD-10-CM | POA: Diagnosis not present

## 2016-11-06 ENCOUNTER — Ambulatory Visit (HOSPITAL_BASED_OUTPATIENT_CLINIC_OR_DEPARTMENT_OTHER): Payer: Medicare Other | Admitting: Hematology & Oncology

## 2016-11-06 ENCOUNTER — Other Ambulatory Visit (HOSPITAL_BASED_OUTPATIENT_CLINIC_OR_DEPARTMENT_OTHER): Payer: Medicare Other

## 2016-11-06 VITALS — BP 119/68 | HR 87 | Temp 97.5°F | Resp 20 | Wt 301.0 lb

## 2016-11-06 DIAGNOSIS — D45 Polycythemia vera: Secondary | ICD-10-CM | POA: Diagnosis not present

## 2016-11-06 DIAGNOSIS — D473 Essential (hemorrhagic) thrombocythemia: Secondary | ICD-10-CM

## 2016-11-06 LAB — CBC WITH DIFFERENTIAL (CANCER CENTER ONLY)
BASO#: 0.3 10*3/uL — ABNORMAL HIGH (ref 0.0–0.2)
BASO%: 3.2 % — ABNORMAL HIGH (ref 0.0–2.0)
EOS%: 1.9 % (ref 0.0–7.0)
Eosinophils Absolute: 0.2 10*3/uL (ref 0.0–0.5)
HCT: 44.9 % (ref 38.7–49.9)
HGB: 13.9 g/dL (ref 13.0–17.1)
LYMPH#: 1.8 10*3/uL (ref 0.9–3.3)
LYMPH%: 17.2 % (ref 14.0–48.0)
MCH: 28.9 pg (ref 28.0–33.4)
MCHC: 31 g/dL — ABNORMAL LOW (ref 32.0–35.9)
MCV: 93 fL (ref 82–98)
MONO#: 0.9 10*3/uL (ref 0.1–0.9)
MONO%: 8.7 % (ref 0.0–13.0)
NEUT#: 7 10*3/uL — ABNORMAL HIGH (ref 1.5–6.5)
NEUT%: 69 % (ref 40.0–80.0)
Platelets: 428 10*3/uL — ABNORMAL HIGH (ref 145–400)
RBC: 4.81 10*6/uL (ref 4.20–5.70)
RDW: 15 % (ref 11.1–15.7)
WBC: 10.2 10*3/uL — ABNORMAL HIGH (ref 4.0–10.0)

## 2016-11-06 LAB — CMP (CANCER CENTER ONLY)
ALT(SGPT): 35 U/L (ref 10–47)
AST: 29 U/L (ref 11–38)
Albumin: 3.5 g/dL (ref 3.3–5.5)
Alkaline Phosphatase: 82 U/L (ref 26–84)
BUN, Bld: 17 mg/dL (ref 7–22)
CO2: 24 mEq/L (ref 18–33)
Calcium: 8.7 mg/dL (ref 8.0–10.3)
Chloride: 106 mEq/L (ref 98–108)
Creat: 1.1 mg/dl (ref 0.6–1.2)
Glucose, Bld: 180 mg/dL — ABNORMAL HIGH (ref 73–118)
Potassium: 4.2 mEq/L (ref 3.3–4.7)
Sodium: 138 mEq/L (ref 128–145)
Total Bilirubin: 0.8 mg/dl (ref 0.20–1.60)
Total Protein: 7.3 g/dL (ref 6.4–8.1)

## 2016-11-06 LAB — IRON AND TIBC
%SAT: 7 % — ABNORMAL LOW (ref 20–55)
Iron: 31 ug/dL — ABNORMAL LOW (ref 42–163)
TIBC: 439 ug/dL — ABNORMAL HIGH (ref 202–409)
UIBC: 408 ug/dL — ABNORMAL HIGH (ref 117–376)

## 2016-11-06 LAB — LACTATE DEHYDROGENASE: LDH: 223 U/L (ref 125–245)

## 2016-11-06 LAB — FERRITIN: Ferritin: 14 ng/ml — ABNORMAL LOW (ref 22–316)

## 2016-11-06 MED ORDER — HYDROXYUREA 500 MG PO CAPS: 500.0000 mg | ORAL_CAPSULE | ORAL | 3 refills | Status: DC

## 2016-11-06 NOTE — Addendum Note (Signed)
Addended by: Burney Gauze R on: 11/06/2016 10:33 AM   Modules accepted: Orders

## 2016-11-06 NOTE — Progress Notes (Signed)
Hematology and Oncology Follow Up Visit  Chayce Robbins [id number] April 17, 1951 66 y.o. 11/06/2016   Principle Diagnosis:   Polycythemia vera-JAK2 positive  Heart block-Mobitz II  Current Therapy:    Hydrea 532m po BID, alternate with 500 mg a day.  Aspirin 81 mg by mouth daily  Phlebotomy to maintain hematocrit below 45%     Interim History:  Mr.  BKlimais back for followup. He is doing okay. He and his wife come in with their great-grandson. He is 864 monthsold.   He is not having any further issues with his back. He has chronic back issues. A lot of this because of his weight. He does not exercise all that much.  He is doing well with the Hydrea. He's had no problems with nausea or vomiting. He's had no rashes. He has had no bleeding or bruising.  His last iron studies done back in May showed a ferritin level and 22 with iron saturation of 10%.  He's doing well with his pacemaker. This was placed back in December 2015. He is had no problems cardiac-wise.  His blood sugars have been on the higher side. His wife is doing her best job in trying to get him to watch what he eats.  It does not sound like they will be doing all that much this summer. It does not look like they will be traveling. They are very much involved with the care of their grandchildren. They really enjoy this.  Overall, his performance status is ECOG 1..   Medications:  Current Outpatient Prescriptions:  .  amoxicillin (AMOXIL) 500 MG capsule, , Disp: , Rfl:  .  aspirin 81 MG tablet, Take 81 mg by mouth daily., Disp: , Rfl:  .  carvedilol (COREG) 12.5 MG tablet, Take 1 tablet (12.5 mg total) by mouth 2 (two) times daily with a meal., Disp: 180 tablet, Rfl: 3 .  citalopram (CELEXA) 10 MG tablet, Take 5 mg by mouth daily. , Disp: , Rfl:  .  furosemide (LASIX) 20 MG tablet, Take 1 tablet (20 mg total) by mouth daily., Disp: 90 tablet, Rfl: 2 .  hydroxyurea (HYDREA) 500 MG capsule, Take 1-2 capsules  (500-1,000 mg total) by mouth See admin instructions. Take one tablet every other day and two tablets the other days., Disp: 45 capsule, Rfl: 2 .  mesalamine (APRISO) 0.375 g 24 hr capsule, Take 375 mg by mouth as directed., Disp: , Rfl:  .  omeprazole (PRILOSEC) 20 MG capsule, Take 20 mg by mouth 2 (two) times daily before a meal. , Disp: , Rfl:  .  Probiotic Product (ALIGN PO), Take by mouth as directed., Disp: , Rfl:  .  sacubitril-valsartan (ENTRESTO) 24-26 MG, Take 1 tablet by mouth 2 (two) times daily., Disp: 60 tablet, Rfl: 12 .  silodosin (RAPAFLO) 8 MG CAPS capsule, Take 8 mg by mouth daily. , Disp: , Rfl:  .  spironolactone (ALDACTONE) 25 MG tablet, TAKE 1/2 TABLET BY MOUTH DAILY, Disp: 45 tablet, Rfl: 3 .  traMADol (ULTRAM) 50 MG tablet, , Disp: , Rfl:  .  ULORIC 40 MG tablet, TAKE 1 TABLET BY MOUTH DAILY., Disp: 30 tablet, Rfl: 6 No current facility-administered medications for this visit.   Facility-Administered Medications Ordered in Other Visits:  .  0.9 %  sodium chloride infusion, , Intravenous, Continuous, Ennever, PRudell Cobb MD, Stopped at 05/16/13 1115  Allergies:  Allergies  Allergen Reactions  . Prednisone Itching and Other (See Comments)    Abdominal pain  .  Prozac [Fluoxetine Hcl] Itching  . Temazepam Other (See Comments)    Dizziness   . Trazodone And Nefazodone Other (See Comments)    Dizziness  . Ibuprofen Itching  . Wellbutrin [Bupropion] Hives    Past Medical History, Surgical history, Social history, and Family History were reviewed and updated.  Review of Systems: As above  Physical Exam:  weight is 301 lb (136.5 kg) (abnormal). His oral temperature is 97.5 F (36.4 C). His blood pressure is 119/68 and his pulse is 87. His respiration is 20 and oxygen saturation is 98%.   Obese white male in no obvious distress. There is no adenopathy in the neck. Lungs are clear. Cardiac exam regular rate and rhythm. He has an occasional extra beat. He has no  murmurs, rubs or bruits. Abdomen is obese but soft. Has good bowel sounds. There is no fluid wave. There is a palpable liver or spleen tip. Back exam shows a well healed laminectomy scar in the lumbar spine. Extremities shows no clubbing cyanosis or edema. He has good range of motion of his joints. He has good strength. Skin exam no rashes, ecchymoses or petechia. Neurological exam is nonfocal.  Lab Results  Component Value Date   WBC 10.2 (H) 11/06/2016   HGB 13.9 11/06/2016   HCT 44.9 11/06/2016   MCV 93 11/06/2016   PLT 428 (H) 11/06/2016     Chemistry      Component Value Date/Time   NA 138 11/06/2016 0917   NA 140 04/05/2016 0739   K 4.2 11/06/2016 0917   K 4.2 04/05/2016 0739   CL 106 11/06/2016 0917   CO2 24 11/06/2016 0917   CO2 20 (L) 04/05/2016 0739   BUN 17 11/06/2016 0917   BUN 14.7 04/05/2016 0739   CREATININE 1.1 11/06/2016 0917   CREATININE 1.1 04/05/2016 0739      Component Value Date/Time   CALCIUM 8.7 11/06/2016 0917   CALCIUM 9.0 04/05/2016 0739   ALKPHOS 82 11/06/2016 0917   ALKPHOS 91 04/05/2016 0739   AST 29 11/06/2016 0917   AST 34 04/05/2016 0739   ALT 35 11/06/2016 0917   ALT 40 04/05/2016 0739   BILITOT 0.80 11/06/2016 0917   BILITOT 0.48 04/05/2016 0739         Impression and Plan: Mr. Persons is 66 year old gentleman with polycythemia vera.   For right now, we do not have to phlebotomize him .  We will have him come back in 6 weeks. I think this would be reasonable.   Volanda Napoleon, MD 7/2/201810:15 AM

## 2016-11-09 ENCOUNTER — Telehealth: Payer: Self-pay | Admitting: *Deleted

## 2016-11-09 NOTE — Telephone Encounter (Signed)
PA for entresto approved ref #[id number].

## 2016-11-12 DIAGNOSIS — J0140 Acute pansinusitis, unspecified: Secondary | ICD-10-CM | POA: Diagnosis not present

## 2016-11-20 DIAGNOSIS — K529 Noninfective gastroenteritis and colitis, unspecified: Secondary | ICD-10-CM | POA: Diagnosis not present

## 2016-11-21 DIAGNOSIS — Z6841 Body Mass Index (BMI) 40.0 and over, adult: Secondary | ICD-10-CM | POA: Diagnosis not present

## 2016-11-21 DIAGNOSIS — M2550 Pain in unspecified joint: Secondary | ICD-10-CM | POA: Diagnosis not present

## 2016-11-21 DIAGNOSIS — M1A09X0 Idiopathic chronic gout, multiple sites, without tophus (tophi): Secondary | ICD-10-CM | POA: Diagnosis not present

## 2016-11-21 DIAGNOSIS — M0579 Rheumatoid arthritis with rheumatoid factor of multiple sites without organ or systems involvement: Secondary | ICD-10-CM | POA: Diagnosis not present

## 2016-11-21 DIAGNOSIS — M7989 Other specified soft tissue disorders: Secondary | ICD-10-CM | POA: Diagnosis not present

## 2016-11-21 DIAGNOSIS — R5382 Chronic fatigue, unspecified: Secondary | ICD-10-CM | POA: Diagnosis not present

## 2016-11-28 ENCOUNTER — Other Ambulatory Visit: Payer: Self-pay | Admitting: Hematology & Oncology

## 2016-12-11 DIAGNOSIS — M2550 Pain in unspecified joint: Secondary | ICD-10-CM | POA: Diagnosis not present

## 2016-12-11 DIAGNOSIS — Z6841 Body Mass Index (BMI) 40.0 and over, adult: Secondary | ICD-10-CM | POA: Diagnosis not present

## 2016-12-11 DIAGNOSIS — M1A09X0 Idiopathic chronic gout, multiple sites, without tophus (tophi): Secondary | ICD-10-CM | POA: Diagnosis not present

## 2016-12-11 DIAGNOSIS — R5382 Chronic fatigue, unspecified: Secondary | ICD-10-CM | POA: Diagnosis not present

## 2016-12-11 DIAGNOSIS — M0579 Rheumatoid arthritis with rheumatoid factor of multiple sites without organ or systems involvement: Secondary | ICD-10-CM | POA: Diagnosis not present

## 2016-12-20 ENCOUNTER — Ambulatory Visit (INDEPENDENT_AMBULATORY_CARE_PROVIDER_SITE_OTHER): Payer: Medicare Other | Admitting: *Deleted

## 2016-12-20 DIAGNOSIS — Z950 Presence of cardiac pacemaker: Secondary | ICD-10-CM

## 2016-12-20 DIAGNOSIS — I5022 Chronic systolic (congestive) heart failure: Secondary | ICD-10-CM

## 2016-12-20 DIAGNOSIS — I429 Cardiomyopathy, unspecified: Secondary | ICD-10-CM | POA: Diagnosis not present

## 2016-12-20 NOTE — Progress Notes (Signed)
Remote defibrillator check.  

## 2016-12-21 ENCOUNTER — Ambulatory Visit (HOSPITAL_BASED_OUTPATIENT_CLINIC_OR_DEPARTMENT_OTHER): Payer: Medicare Other | Admitting: Hematology & Oncology

## 2016-12-21 ENCOUNTER — Other Ambulatory Visit (HOSPITAL_BASED_OUTPATIENT_CLINIC_OR_DEPARTMENT_OTHER): Payer: Medicare Other

## 2016-12-21 VITALS — BP 135/56 | HR 83 | Temp 98.0°F | Resp 16 | Wt 304.1 lb

## 2016-12-21 DIAGNOSIS — D45 Polycythemia vera: Secondary | ICD-10-CM

## 2016-12-21 DIAGNOSIS — D473 Essential (hemorrhagic) thrombocythemia: Secondary | ICD-10-CM

## 2016-12-21 DIAGNOSIS — E611 Iron deficiency: Secondary | ICD-10-CM

## 2016-12-21 LAB — CUP PACEART REMOTE DEVICE CHECK
Battery Remaining Longevity: 93 mo
Battery Remaining Percentage: 95.5 %
Battery Voltage: 2.98 V
Brady Statistic AP VP Percent: 9.8 %
Brady Statistic AP VS Percent: 1 %
Brady Statistic AS VP Percent: 89 %
Brady Statistic AS VS Percent: 1 %
Brady Statistic RA Percent Paced: 8.7 %
Date Time Interrogation Session: 20180815060024
Implantable Lead Implant Date: 20151223
Implantable Lead Implant Date: 20151223
Implantable Lead Implant Date: 20151223
Implantable Lead Location: 753858
Implantable Lead Location: 753859
Implantable Lead Location: 753860
Implantable Pulse Generator Implant Date: 20151223
Lead Channel Impedance Value: 450 Ohm
Lead Channel Impedance Value: 460 Ohm
Lead Channel Impedance Value: 730 Ohm
Lead Channel Pacing Threshold Amplitude: 0.75 V
Lead Channel Pacing Threshold Amplitude: 1.25 V
Lead Channel Pacing Threshold Amplitude: 1.375 V
Lead Channel Pacing Threshold Pulse Width: 0.4 ms
Lead Channel Pacing Threshold Pulse Width: 0.4 ms
Lead Channel Pacing Threshold Pulse Width: 0.7 ms
Lead Channel Sensing Intrinsic Amplitude: 12 mV
Lead Channel Sensing Intrinsic Amplitude: 4 mV
Lead Channel Setting Pacing Amplitude: 1.75 V
Lead Channel Setting Pacing Amplitude: 2.25 V
Lead Channel Setting Pacing Amplitude: 2.375
Lead Channel Setting Pacing Pulse Width: 0.4 ms
Lead Channel Setting Pacing Pulse Width: 0.7 ms
Lead Channel Setting Sensing Sensitivity: 2 mV
Pulse Gen Model: 3242
Pulse Gen Serial Number: 7701275

## 2016-12-21 LAB — IRON AND TIBC
%SAT: 7 % — ABNORMAL LOW (ref 20–55)
Iron: 31 ug/dL — ABNORMAL LOW (ref 42–163)
TIBC: 418 ug/dL — ABNORMAL HIGH (ref 202–409)
UIBC: 386 ug/dL — ABNORMAL HIGH (ref 117–376)

## 2016-12-21 LAB — CMP (CANCER CENTER ONLY)
ALT(SGPT): 40 U/L (ref 10–47)
AST: 40 U/L — ABNORMAL HIGH (ref 11–38)
Albumin: 3.6 g/dL (ref 3.3–5.5)
Alkaline Phosphatase: 94 U/L — ABNORMAL HIGH (ref 26–84)
BUN, Bld: 19 mg/dL (ref 7–22)
CO2: 27 mEq/L (ref 18–33)
Calcium: 8.8 mg/dL (ref 8.0–10.3)
Chloride: 105 mEq/L (ref 98–108)
Creat: 1.5 mg/dl — ABNORMAL HIGH (ref 0.6–1.2)
Glucose, Bld: 223 mg/dL — ABNORMAL HIGH (ref 73–118)
Potassium: 4.1 mEq/L (ref 3.3–4.7)
Sodium: 138 mEq/L (ref 128–145)
Total Bilirubin: 0.8 mg/dl (ref 0.20–1.60)
Total Protein: 7.5 g/dL (ref 6.4–8.1)

## 2016-12-21 LAB — CBC WITH DIFFERENTIAL (CANCER CENTER ONLY)
BASO#: 0.3 10*3/uL — ABNORMAL HIGH (ref 0.0–0.2)
BASO%: 3.2 % — ABNORMAL HIGH (ref 0.0–2.0)
EOS%: 3 % (ref 0.0–7.0)
Eosinophils Absolute: 0.3 10*3/uL (ref 0.0–0.5)
HCT: 43.3 % (ref 38.7–49.9)
HGB: 13.6 g/dL (ref 13.0–17.1)
LYMPH#: 1.7 10*3/uL (ref 0.9–3.3)
LYMPH%: 19 % (ref 14.0–48.0)
MCH: 29 pg (ref 28.0–33.4)
MCHC: 31.4 g/dL — ABNORMAL LOW (ref 32.0–35.9)
MCV: 92 fL (ref 82–98)
MONO#: 0.3 10*3/uL (ref 0.1–0.9)
MONO%: 3.7 % (ref 0.0–13.0)
NEUT#: 6.2 10*3/uL (ref 1.5–6.5)
NEUT%: 71.1 % (ref 40.0–80.0)
Platelets: 431 10*3/uL — ABNORMAL HIGH (ref 145–400)
RBC: 4.69 10*6/uL (ref 4.20–5.70)
RDW: 14.9 % (ref 11.1–15.7)
WBC: 8.7 10*3/uL (ref 4.0–10.0)

## 2016-12-21 LAB — FERRITIN: Ferritin: 16 ng/ml — ABNORMAL LOW (ref 22–316)

## 2016-12-21 NOTE — Progress Notes (Signed)
Hematology and Oncology Follow Up Visit  William Villanueva [id number] 1951/04/07 66 y.o. 12/21/2016   Principle Diagnosis:   Polycythemia vera-JAK2 positive  Heart block-Mobitz II  Current Therapy:    Hydrea 591m po BID, alternate with 500 mg a day.  Aspirin 81 mg by mouth daily  Phlebotomy to maintain hematocrit below 45%     Interim History:  William Villanueva back for followup. He comes in by himself. He always is with his wife. However, she is home with one of the grandchildren.  The big news is that his vision following got taking care of. He apparently had double vision. He has special glasses for this.  [place] [facility] They see a lot of friends.  His heart is doing well. He has the pacemaker in. He is not sure when he goes back to see the cardiologist.  His iron studies clearly show iron deficiency. I'm sure this is probably why we have not had to phlebotomize him that much. Back in July, his ferritin was 14 with an iron saturation of 7%.   His back still bothers him. It is hard for him do exercise. It is hard for him to lose weight.  There's been no fever. He's had no problems with bowels or bladder. There's been no swelling.  Overall, his performance status is ECOG 1..   Medications:  Current Outpatient Prescriptions:  .  amoxicillin (AMOXIL) 500 MG capsule, , Disp: , Rfl:  .  aspirin 81 MG tablet, Take 81 mg by mouth daily., Disp: , Rfl:  .  carvedilol (COREG) 12.5 MG tablet, Take 1 tablet (12.5 mg total) by mouth 2 (two) times daily with a meal., Disp: 180 tablet, Rfl: 3 .  citalopram (CELEXA) 10 MG tablet, Take 5 mg by mouth daily. , Disp: , Rfl:  .  furosemide (LASIX) 20 MG tablet, Take 1 tablet (20 mg total) by mouth daily., Disp: 90 tablet, Rfl: 2 .  hydroxyurea (HYDREA) 500 MG capsule, Take 1 capsule (500 mg total) by mouth See admin instructions. Take one tablet every other day  and two tablets the other days., Disp: 135 capsule, Rfl: 3 .  hydroxyurea (HYDREA) 500 MG capsule, TAKE 1 CAPSULE BY MOUTH DAILY, ALTERNATING WTIH 2 CAPSULES EVERY OTHER DAY, Disp: 45 capsule, Rfl: 1 .  mesalamine (APRISO) 0.375 g 24 hr capsule, Take 375 mg by mouth as directed., Disp: , Rfl:  .  omeprazole (PRILOSEC) 20 MG capsule, Take 20 mg by mouth 2 (two) times daily before a meal. , Disp: , Rfl:  .  Probiotic Product (ALIGN PO), Take by mouth as directed., Disp: , Rfl:  .  sacubitril-valsartan (ENTRESTO) 24-26 MG, Take 1 tablet by mouth 2 (two) times daily., Disp: 60 tablet, Rfl: 12 .  silodosin (RAPAFLO) 8 MG CAPS capsule, Take 8 mg by mouth daily. , Disp: , Rfl:  .  spironolactone (ALDACTONE) 25 MG tablet, TAKE 1/2 TABLET BY MOUTH DAILY, Disp: 45 tablet, Rfl: 3 .  traMADol (ULTRAM) 50 MG tablet, , Disp: , Rfl:  .  ULORIC 40 MG tablet, TAKE 1 TABLET BY MOUTH DAILY., Disp: 30 tablet, Rfl: 6 No current facility-administered medications for this visit.   Facility-Administered Medications Ordered in Other Visits:  .  0.9 %  sodium chloride infusion, , Intravenous, Continuous, William Villanueva, William Villanueva, Stopped at 05/16/13 1115  Allergies:  Allergies  Allergen Reactions  .  Prednisone Itching and Other (See Comments)    Abdominal pain  . Prozac [Fluoxetine Hcl] Itching  . Temazepam Other (See Comments)    Dizziness   . Trazodone And Nefazodone Other (See Comments)    Dizziness  . Ibuprofen Itching  . Wellbutrin [Bupropion] Hives    Past Medical History, Surgical history, Social history, and Family History were reviewed and updated.  Review of Systems: As above  Physical Exam:  weight is 304 lb 1.9 oz (137.9 kg) (abnormal). His oral temperature is 98 F (36.7 C). His blood pressure is 135/56 (abnormal) and his pulse is 83. His respiration is 16 and oxygen saturation is 99%.   Obese white male in no obvious distress. There is no adenopathy in the neck. Lungs are clear. Cardiac exam  regular rate and rhythm. He has an occasional extra beat. He has no murmurs, rubs or bruits. Abdomen is obese but soft. Has good bowel sounds. There is no fluid wave. There is a palpable liver or spleen tip. Back exam shows a well healed laminectomy scar in the lumbar spine. Extremities shows no clubbing cyanosis or edema. He has good range of motion of his joints. He has good strength. Skin exam no rashes, ecchymoses or petechia. Neurological exam is nonfocal.  Lab Results  Component Value Date   WBC 8.7 12/21/2016   HGB 13.6 12/21/2016   HCT 43.3 12/21/2016   MCV 92 12/21/2016   PLT 431 (H) 12/21/2016     Chemistry      Component Value Date/Time   NA 138 12/21/2016 0917   NA 140 04/05/2016 0739   K 4.1 12/21/2016 0917   K 4.2 04/05/2016 0739   CL 105 12/21/2016 0917   CO2 27 12/21/2016 0917   CO2 20 (L) 04/05/2016 0739   BUN 19 12/21/2016 0917   BUN 14.7 04/05/2016 0739   CREATININE 1.5 (H) 12/21/2016 0917   CREATININE 1.1 04/05/2016 0739      Component Value Date/Time   CALCIUM 8.8 12/21/2016 0917   CALCIUM 9.0 04/05/2016 0739   ALKPHOS 94 (H) 12/21/2016 0917   ALKPHOS 91 04/05/2016 0739   AST 40 (H) 12/21/2016 0917   AST 34 04/05/2016 0739   ALT 40 12/21/2016 0917   ALT 40 04/05/2016 0739   BILITOT 0.80 12/21/2016 0917   BILITOT 0.48 04/05/2016 0739         Impression and Plan: William Villanueva is 66 year old gentleman with polycythemia vera.  I'm so happy that he just had his anniversary.  We do not have to phlebotomize him. I think he is on a good dose of the Hydrea.  We will get him back now in 2 months. I think this would be a good time frame. We might have to phlebotomize him.    William Napoleon, Villanueva 8/16/201810:12 AM

## 2017-01-02 ENCOUNTER — Encounter: Payer: Self-pay | Admitting: Cardiology

## 2017-01-02 DIAGNOSIS — M0579 Rheumatoid arthritis with rheumatoid factor of multiple sites without organ or systems involvement: Secondary | ICD-10-CM | POA: Diagnosis not present

## 2017-01-18 NOTE — Progress Notes (Signed)
HPI: FU cardiomyopathy/CHF. Echocardiogram in August of 2013 showed an ejection fraction of 35-40%. Cardiac catheterization in September of 2013 showed mild nonobstructive coronary disease. There was no hemodynamic evidence of restriction. Pulmonary capillary wedge pressure 10. Cardiac MRI in September of 2013 showed an ejection fraction of 34% with diffuse hypokinesis. There was no hyperenhancement or scar tissue and no evidence of cardiac hemochromatosis. TSH in September 2013 normal. 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 Patient subsequently had CRTP placed. Nuclear study March 2017 showed ejection fraction 32. There was prior inferior infarct but no ischemia. Treated medically as felt likely attenuation artifact. Had C. difficile colitis January 2018 associated with acute renal failure. Echocardiogram April 2018 showed ejection fraction 04-54%, grade 1 diastolic dysfunction and moderate left atrial enlargement. Since last seen, he has some dyspnea on exertion but no orthopnea, PND or pedal edema. He does not have exertional chest pain. No palpitations or syncope.  Current Outpatient Prescriptions  Medication Sig Dispense Refill  . amoxicillin (AMOXIL) 500 MG capsule     . aspirin 81 MG tablet Take 81 mg by mouth daily.    . carvedilol (COREG) 12.5 MG tablet Take 1 tablet (12.5 mg total) by mouth 2 (two) times daily with a meal. 180 tablet 3  . citalopram (CELEXA) 10 MG tablet Take 5 mg by mouth daily.     . furosemide (LASIX) 20 MG tablet Take 1 tablet (20 mg total) by mouth daily. 90 tablet 2  . hydroxyurea (HYDREA) 500 MG capsule Take 1 capsule (500 mg total) by mouth See admin instructions. Take one tablet every other day and two tablets the other days. 135 capsule 3  . mesalamine (APRISO) 0.375 g 24 hr capsule Take 375 mg by mouth as directed.    [name] omeprazole (PRILOSEC) 20 MG capsule Take 20 mg by mouth 2 (two) times daily before a meal.     . Probiotic Product  (ALIGN PO) Take by mouth as directed.    . sacubitril-valsartan (ENTRESTO) 24-26 MG Take 1 tablet by mouth 2 (two) times daily. 60 tablet 12  . silodosin (RAPAFLO) 8 MG CAPS capsule Take 8 mg by mouth daily.     [name] spironolactone (ALDACTONE) 25 MG tablet TAKE 1/2 TABLET BY MOUTH DAILY 45 tablet 3  . traMADol (ULTRAM) 50 MG tablet     . ULORIC 80 MG TABS Take 1 tablet by mouth daily.     No current facility-administered medications for this visit.    Facility-Administered Medications Ordered in Other Visits  Medication Dose Route Frequency Provider Last Rate Last Dose  . 0.9 %  sodium chloride infusion   Intravenous Continuous Volanda Napoleon, MD   Stopped at 05/16/13 1115     Past Medical History:  Diagnosis Date  . AV block, 2nd degree 2015   St. Jude Allure Quadra pulse generator [id number], model PM 3242  . Back pain   . Cardiomyopathy ([place])    Nonischemic 45%.   . CHF (congestive heart failure) ([place])   . Gout   . Hemochromatosis   . Hypertension   . Hypospadias May 12, 1950   born with  . Nephrolithiasis   . Polycythemia vera(238.4)     Past Surgical History:  Procedure Laterality Date  . ANKLE SURGERY    . BI-VENTRICULAR PACEMAKER INSERTION N/A 04/29/2014   Procedure: BI-VENTRICULAR PACEMAKER INSERTION (CRT-P);  Surgeon: Deboraha Sprang, MD; Laterality: Left  St. Jude Allure Quadra pulse generator [id number], model PM  3242  . PILONIDAL CYST EXCISION    . POSTERIOR LAMINECTOMY / DECOMPRESSION LUMBAR SPINE    . TONSILLECTOMY      Social History   Social History  . Marital status: Married    Spouse name: N/A  . Number of children: N/A  . Years of education: N/A   Occupational History  . Not on file.   Social History Main Topics  . Smoking status: Former Smoker    Packs/day: 1.00    Years: 44.00    Types: Cigarettes    Start date: 06/05/1968    Quit date: 04/05/2013  . Smokeless tobacco: Never Used     Comment: quit 2 years ago  . Alcohol use 0.0 oz/week      Comment: Rare - once per month  . Drug use: No  . Sexual activity: Not on file   Other Topics Concern  . Not on file   Social History Narrative   Lives at home with wife in a one story home.  Has no children.  Does not work.  Getting workman's comp.  Education: 4 years trade school.     Family History  Problem Relation Age of Onset  . Heart disease Maternal Grandmother        Pacemaker, MI  . Stroke Mother   . Other Father        Deceased, car fell on him  . Diabetes Sister   . Hypertension Sister     ROS: Back pain but no fevers or chills, productive cough, hemoptysis, dysphasia, odynophagia, melena, hematochezia, dysuria, hematuria, rash, seizure activity, orthopnea, PND, pedal edema, claudication. Remaining systems are negative.  Physical Exam: Well-developed obese in no acute distress.  Skin is warm and dry.  HEENT is normal.  Neck is supple.  Chest is clear to auscultation with normal expansion.  Cardiovascular exam is regular rate and rhythm.  Abdominal exam nontender or distended. No masses palpated. Extremities show no edema. neuro grossly intact   A/P  1 Nonischemic cardiomyopathy-plan to continue carvedilol and entresto.  2 hypertension-blood pressure is controlled. Continue present medications.   3 chronic systolic congestive heart failure-patient appears to be euvolemic on examination. Continue present dose of diuretics.   4 morbid obesity-we discussed the importance of weight loss.  5 CRT-Pt is followed by electrophysiology.  Kirk Ruths, MD

## 2017-01-22 DIAGNOSIS — Z6841 Body Mass Index (BMI) 40.0 and over, adult: Secondary | ICD-10-CM | POA: Diagnosis not present

## 2017-01-22 DIAGNOSIS — M0579 Rheumatoid arthritis with rheumatoid factor of multiple sites without organ or systems involvement: Secondary | ICD-10-CM | POA: Diagnosis not present

## 2017-01-22 DIAGNOSIS — R5382 Chronic fatigue, unspecified: Secondary | ICD-10-CM | POA: Diagnosis not present

## 2017-01-22 DIAGNOSIS — Z719 Counseling, unspecified: Secondary | ICD-10-CM | POA: Diagnosis not present

## 2017-01-22 DIAGNOSIS — M2550 Pain in unspecified joint: Secondary | ICD-10-CM | POA: Diagnosis not present

## 2017-01-22 DIAGNOSIS — M1A09X0 Idiopathic chronic gout, multiple sites, without tophus (tophi): Secondary | ICD-10-CM | POA: Diagnosis not present

## 2017-01-24 DIAGNOSIS — R350 Frequency of micturition: Secondary | ICD-10-CM | POA: Diagnosis not present

## 2017-01-24 DIAGNOSIS — N401 Enlarged prostate with lower urinary tract symptoms: Secondary | ICD-10-CM | POA: Diagnosis not present

## 2017-01-29 ENCOUNTER — Encounter: Payer: Self-pay | Admitting: Cardiology

## 2017-01-29 ENCOUNTER — Ambulatory Visit (INDEPENDENT_AMBULATORY_CARE_PROVIDER_SITE_OTHER): Payer: Medicare Other | Admitting: Cardiology

## 2017-01-29 VITALS — BP 102/70 | HR 78 | Ht 70.0 in | Wt 304.6 lb

## 2017-01-29 DIAGNOSIS — I10 Essential (primary) hypertension: Secondary | ICD-10-CM

## 2017-01-29 DIAGNOSIS — I429 Cardiomyopathy, unspecified: Secondary | ICD-10-CM

## 2017-01-29 DIAGNOSIS — I5022 Chronic systolic (congestive) heart failure: Secondary | ICD-10-CM

## 2017-01-29 NOTE — Patient Instructions (Signed)
Your physician wants you to follow-up in: ONE YEAR WITH DR CRENSHAW You will receive a reminder letter in the mail two months in advance. If you don't receive a letter, please call our office to schedule the follow-up appointment.   If you need a refill on your cardiac medications before your next appointment, please call your pharmacy.  

## 2017-02-16 ENCOUNTER — Other Ambulatory Visit: Payer: Self-pay | Admitting: Cardiology

## 2017-02-16 DIAGNOSIS — R0602 Shortness of breath: Secondary | ICD-10-CM

## 2017-02-16 MED ORDER — FUROSEMIDE 20 MG PO TABS: 20.0000 mg | ORAL_TABLET | Freq: Every day | ORAL | 1 refills | Status: DC

## 2017-02-16 MED ORDER — SACUBITRIL-VALSARTAN 24-26 MG PO TABS: 1.0000 | ORAL_TABLET | Freq: Two times a day (BID) | ORAL | 1 refills | Status: DC

## 2017-02-16 NOTE — Telephone Encounter (Signed)
New message   Patient is out of medication    *STAT* If patient is at the pharmacy, call can be transferred to refill team.   1. Which medications need to be refilled? (please list name of each medication and dose if known) furosemide (LASIX) 20 MG tablet sacubitril-valsartan (ENTRESTO) 24-26 MG  2. Which pharmacy/location (including street and city if local pharmacy) is medication to be sent to? [facility] -[address] [place]   3. Do they need a 30 day or 90 day supply?  [place]

## 2017-02-16 NOTE — Telephone Encounter (Signed)
Rx(s) sent to pharmacy electronically.  

## 2017-02-20 ENCOUNTER — Other Ambulatory Visit (HOSPITAL_BASED_OUTPATIENT_CLINIC_OR_DEPARTMENT_OTHER): Payer: Medicare Other

## 2017-02-20 ENCOUNTER — Ambulatory Visit (HOSPITAL_BASED_OUTPATIENT_CLINIC_OR_DEPARTMENT_OTHER): Payer: Medicare Other | Admitting: Hematology & Oncology

## 2017-02-20 VITALS — BP 107/59 | HR 74 | Temp 98.1°F | Resp 21 | Wt 309.0 lb

## 2017-02-20 DIAGNOSIS — D45 Polycythemia vera: Secondary | ICD-10-CM

## 2017-02-20 DIAGNOSIS — G8929 Other chronic pain: Secondary | ICD-10-CM

## 2017-02-20 DIAGNOSIS — M549 Dorsalgia, unspecified: Secondary | ICD-10-CM | POA: Diagnosis not present

## 2017-02-20 DIAGNOSIS — D473 Essential (hemorrhagic) thrombocythemia: Secondary | ICD-10-CM

## 2017-02-20 LAB — CMP (CANCER CENTER ONLY)
ALT(SGPT): 39 U/L (ref 10–47)
AST: 36 U/L (ref 11–38)
Albumin: 3.3 g/dL (ref 3.3–5.5)
Alkaline Phosphatase: 81 U/L (ref 26–84)
BUN, Bld: 16 mg/dL (ref 7–22)
CO2: 23 mEq/L (ref 18–33)
Calcium: 9 mg/dL (ref 8.0–10.3)
Chloride: 108 mEq/L (ref 98–108)
Creat: 1.1 mg/dl (ref 0.6–1.2)
Glucose, Bld: 223 mg/dL — ABNORMAL HIGH (ref 73–118)
Potassium: 3.9 mEq/L (ref 3.3–4.7)
Sodium: 138 mEq/L (ref 128–145)
Total Bilirubin: 0.8 mg/dl (ref 0.20–1.60)
Total Protein: 7.2 g/dL (ref 6.4–8.1)

## 2017-02-20 LAB — CBC WITH DIFFERENTIAL (CANCER CENTER ONLY)
BASO#: 0.3 10*3/uL — ABNORMAL HIGH (ref 0.0–0.2)
BASO%: 3.1 % — ABNORMAL HIGH (ref 0.0–2.0)
EOS%: 2.2 % (ref 0.0–7.0)
Eosinophils Absolute: 0.2 10*3/uL (ref 0.0–0.5)
HCT: 44.1 % (ref 38.7–49.9)
HGB: 13.8 g/dL (ref 13.0–17.1)
LYMPH#: 1.4 10*3/uL (ref 0.9–3.3)
LYMPH%: 14.9 % (ref 14.0–48.0)
MCH: 30.2 pg (ref 28.0–33.4)
MCHC: 31.3 g/dL — ABNORMAL LOW (ref 32.0–35.9)
MCV: 97 fL (ref 82–98)
MONO#: 0.6 10*3/uL (ref 0.1–0.9)
MONO%: 6.4 % (ref 0.0–13.0)
NEUT#: 6.7 10*3/uL — ABNORMAL HIGH (ref 1.5–6.5)
NEUT%: 73.4 % (ref 40.0–80.0)
Platelets: 261 10*3/uL (ref 145–400)
RBC: 4.57 10*6/uL (ref 4.20–5.70)
RDW: 17.9 % — ABNORMAL HIGH (ref 11.1–15.7)
WBC: 9.1 10*3/uL (ref 4.0–10.0)

## 2017-02-20 LAB — LACTATE DEHYDROGENASE: LDH: 230 U/L (ref 125–245)

## 2017-02-20 LAB — FERRITIN: Ferritin: 18 ng/ml — ABNORMAL LOW (ref 22–316)

## 2017-02-20 LAB — IRON AND TIBC
%SAT: 12 % — ABNORMAL LOW (ref 20–55)
Iron: 47 ug/dL (ref 42–163)
TIBC: 410 ug/dL — ABNORMAL HIGH (ref 202–409)
UIBC: 363 ug/dL (ref 117–376)

## 2017-02-20 NOTE — Progress Notes (Signed)
Hematology and Oncology Follow Up Visit  [place] Dresser [id number] [birth year] 66 y.o. 02/20/2017   Principle Diagnosis:   Polycythemia vera-JAK2 positive  Heart block-Mobitz II  Current Therapy:    Hydrea 516m po daily-dose changed on 02/20/2017.  Aspirin 81 mg by mouth daily  Phlebotomy to maintain hematocrit below 45%     Interim History:  Mr.  BMotternis back for followup. He comes in by himself. He is having some difficulties. He is having a lot of back pain. He is was had chronic back problems. He does see a pain specialist. However, the pain specialist refuses to give him pain medication.  He might consider a TENS unit. This might be the best option for him. He will go back to the pain doctor and talk about this.  I want him to decrease his Hydrea to 500 mg by mouth daily. His platelet count has responded very nicely.  He's had no cough. He's had no shortness of breath. His heart is doing okay. He has the pacemaker.  His blood sugars are on the high side. This is no surprise.  He's had no obvious change in bowel or bladder habits.  Overall, his performance status is ECOG 1..   Medications:  Current Outpatient Prescriptions:  .  amoxicillin (AMOXIL) 500 MG capsule, , Disp: , Rfl:  .  aspirin 81 MG tablet, Take 81 mg by mouth daily., Disp: , Rfl:  .  carvedilol (COREG) 12.5 MG tablet, Take 1 tablet (12.5 mg total) by mouth 2 (two) times daily with a meal., Disp: 180 tablet, Rfl: 3 .  citalopram (CELEXA) 10 MG tablet, Take 5 mg by mouth daily. , Disp: , Rfl:  .  furosemide (LASIX) 20 MG tablet, Take 1 tablet (20 mg total) by mouth daily., Disp: 90 tablet, Rfl: 1 .  hydroxyurea (HYDREA) 500 MG capsule, Take 1 capsule (500 mg total) by mouth See admin instructions. Take one tablet every other day and two tablets the other days., Disp: 135 capsule, Rfl: 3 .  mesalamine (APRISO) 0.375 g 24 hr capsule, Take 375 mg by mouth as directed., Disp: , Rfl:  .  omeprazole  (PRILOSEC) 20 MG capsule, Take 20 mg by mouth 2 (two) times daily before a meal. , Disp: , Rfl:  .  Probiotic Product (ALIGN PO), Take by mouth as directed., Disp: , Rfl:  .  sacubitril-valsartan (ENTRESTO) 24-26 MG, Take 1 tablet by mouth 2 (two) times daily., Disp: 180 tablet, Rfl: 1 .  silodosin (RAPAFLO) 8 MG CAPS capsule, Take 8 mg by mouth daily. , Disp: , Rfl:  .  spironolactone (ALDACTONE) 25 MG tablet, TAKE 1/2 TABLET BY MOUTH DAILY, Disp: 45 tablet, Rfl: 3 .  traMADol (ULTRAM) 50 MG tablet, , Disp: , Rfl:  .  ULORIC 80 MG TABS, Take 1 tablet by mouth daily., Disp: , Rfl:  No current facility-administered medications for this visit.   Facility-Administered Medications Ordered in Other Visits:  .  0.9 %  sodium chloride infusion, , Intravenous, Continuous, Ennever, PRudell Cobb MD, Stopped at 05/16/13 1115  Allergies:  Allergies  Allergen Reactions  . Prednisone Itching and Other (See Comments)    Abdominal pain  . Prozac [Fluoxetine Hcl] Itching  . Temazepam Other (See Comments)    Dizziness   . Trazodone And Nefazodone Other (See Comments)    Dizziness  . Ibuprofen Itching  . Wellbutrin [Bupropion] Hives    Past Medical History, Surgical history, Social history, and Family History were reviewed and  updated.  Review of Systems: As stated in the interim history  Physical Exam:  weight is 309 lb (140.2 kg) (abnormal). His oral temperature is 98.1 F (36.7 C). His blood pressure is 107/59 (abnormal) and his pulse is 74. His respiration is 21 (abnormal) and oxygen saturation is 97%.   I examined Mr. Bialy. The results of my examination are noted below with appropriate changes:   Obese white male in no obvious distress. There is no adenopathy in the neck. Lungs are clear. Cardiac exam regular rate and rhythm. He has an occasional extra beat. He has no murmurs, rubs or bruits. Abdomen is obese but soft. Has good bowel sounds. There is no fluid wave. There is a palpable liver or  spleen tip. Back exam shows a well healed laminectomy scar in the lumbar spine. Extremities shows no clubbing cyanosis or edema. He has good range of motion of his joints. He has good strength. Skin exam no rashes, ecchymoses or petechia. Neurological exam is nonfocal.  Lab Results  Component Value Date   WBC 9.1 02/20/2017   HGB 13.8 02/20/2017   HCT 44.1 02/20/2017   MCV 97 02/20/2017   PLT 261 02/20/2017     Chemistry      Component Value Date/Time   NA 138 02/20/2017 1045   NA 140 04/05/2016 0739   K 3.9 02/20/2017 1045   K 4.2 04/05/2016 0739   CL 108 02/20/2017 1045   CO2 23 02/20/2017 1045   CO2 20 (L) 04/05/2016 0739   BUN 16 02/20/2017 1045   BUN 14.7 04/05/2016 0739   CREATININE 1.1 02/20/2017 1045   CREATININE 1.1 04/05/2016 0739      Component Value Date/Time   CALCIUM 9.0 02/20/2017 1045   CALCIUM 9.0 04/05/2016 0739   ALKPHOS 81 02/20/2017 1045   ALKPHOS 91 04/05/2016 0739   AST 36 02/20/2017 1045   AST 34 04/05/2016 0739   ALT 39 02/20/2017 1045   ALT 40 04/05/2016 0739   BILITOT 0.80 02/20/2017 1045   BILITOT 0.48 04/05/2016 0739         Impression and Plan: Mr. Wilkie is 66 year old gentleman with polycythemia vera.  Because his blood count is doing so well, we will decrease his Hydrea dose to 500 mg by mouth daily.  He does not need to be phlebotomized.  We will go ahead and have him come back in 6 weeks. He may need to be phlebotomized at that point.  With this current opioid crisis, his pain doctor refuses to give him any kind of pain medication. As such, he is suffering because of pain. I just feel bad for him that he is a "victim" of this opioid crisis.   Volanda Napoleon, MD 10/16/201812:47 PM

## 2017-02-23 DIAGNOSIS — M109 Gout, unspecified: Secondary | ICD-10-CM | POA: Diagnosis not present

## 2017-02-23 DIAGNOSIS — Z23 Encounter for immunization: Secondary | ICD-10-CM | POA: Diagnosis not present

## 2017-02-23 DIAGNOSIS — E1122 Type 2 diabetes mellitus with diabetic chronic kidney disease: Secondary | ICD-10-CM | POA: Diagnosis not present

## 2017-02-23 DIAGNOSIS — N183 Chronic kidney disease, stage 3 (moderate): Secondary | ICD-10-CM | POA: Diagnosis not present

## 2017-02-23 DIAGNOSIS — L989 Disorder of the skin and subcutaneous tissue, unspecified: Secondary | ICD-10-CM | POA: Diagnosis not present

## 2017-02-23 DIAGNOSIS — B356 Tinea cruris: Secondary | ICD-10-CM | POA: Diagnosis not present

## 2017-02-23 DIAGNOSIS — F321 Major depressive disorder, single episode, moderate: Secondary | ICD-10-CM | POA: Diagnosis not present

## 2017-02-23 DIAGNOSIS — I10 Essential (primary) hypertension: Secondary | ICD-10-CM | POA: Diagnosis not present

## 2017-03-06 DIAGNOSIS — L821 Other seborrheic keratosis: Secondary | ICD-10-CM | POA: Diagnosis not present

## 2017-03-06 DIAGNOSIS — D225 Melanocytic nevi of trunk: Secondary | ICD-10-CM | POA: Diagnosis not present

## 2017-03-06 DIAGNOSIS — L918 Other hypertrophic disorders of the skin: Secondary | ICD-10-CM | POA: Diagnosis not present

## 2017-03-06 DIAGNOSIS — L820 Inflamed seborrheic keratosis: Secondary | ICD-10-CM | POA: Diagnosis not present

## 2017-03-14 DIAGNOSIS — H532 Diplopia: Secondary | ICD-10-CM | POA: Diagnosis not present

## 2017-03-21 ENCOUNTER — Ambulatory Visit (INDEPENDENT_AMBULATORY_CARE_PROVIDER_SITE_OTHER): Payer: Medicare Other | Admitting: *Deleted

## 2017-03-21 DIAGNOSIS — R5382 Chronic fatigue, unspecified: Secondary | ICD-10-CM | POA: Diagnosis not present

## 2017-03-21 DIAGNOSIS — Z6841 Body Mass Index (BMI) 40.0 and over, adult: Secondary | ICD-10-CM | POA: Diagnosis not present

## 2017-03-21 DIAGNOSIS — M2550 Pain in unspecified joint: Secondary | ICD-10-CM | POA: Diagnosis not present

## 2017-03-21 DIAGNOSIS — I5032 Chronic diastolic (congestive) heart failure: Secondary | ICD-10-CM

## 2017-03-21 DIAGNOSIS — I428 Other cardiomyopathies: Secondary | ICD-10-CM | POA: Diagnosis not present

## 2017-03-21 DIAGNOSIS — Z719 Counseling, unspecified: Secondary | ICD-10-CM | POA: Diagnosis not present

## 2017-03-21 DIAGNOSIS — I442 Atrioventricular block, complete: Secondary | ICD-10-CM

## 2017-03-21 DIAGNOSIS — M0579 Rheumatoid arthritis with rheumatoid factor of multiple sites without organ or systems involvement: Secondary | ICD-10-CM | POA: Diagnosis not present

## 2017-03-21 DIAGNOSIS — M1A09X0 Idiopathic chronic gout, multiple sites, without tophus (tophi): Secondary | ICD-10-CM | POA: Diagnosis not present

## 2017-03-21 NOTE — Progress Notes (Signed)
Remote pacemaker transmission.   

## 2017-03-22 LAB — CUP PACEART REMOTE DEVICE CHECK
Battery Remaining Longevity: 92 mo
Battery Remaining Percentage: 95.5 %
Battery Voltage: 2.98 V
Brady Statistic AP VP Percent: 10 %
Brady Statistic AP VS Percent: 1 %
Brady Statistic AS VP Percent: 88 %
Brady Statistic AS VS Percent: 1 %
Brady Statistic RA Percent Paced: 9.1 %
Date Time Interrogation Session: 20181114103845
Implantable Lead Implant Date: 20151223
Implantable Lead Implant Date: 20151223
Implantable Lead Implant Date: 20151223
Implantable Lead Location: 753858
Implantable Lead Location: 753859
Implantable Lead Location: 753860
Implantable Pulse Generator Implant Date: 20151223
Lead Channel Impedance Value: 410 Ohm
Lead Channel Impedance Value: 460 Ohm
Lead Channel Impedance Value: 750 Ohm
Lead Channel Pacing Threshold Amplitude: 0.875 V
Lead Channel Pacing Threshold Amplitude: 1.125 V
Lead Channel Pacing Threshold Amplitude: 1.25 V
Lead Channel Pacing Threshold Pulse Width: 0.4 ms
Lead Channel Pacing Threshold Pulse Width: 0.4 ms
Lead Channel Pacing Threshold Pulse Width: 0.7 ms
Lead Channel Sensing Intrinsic Amplitude: 11.1 mV
Lead Channel Sensing Intrinsic Amplitude: 3.5 mV
Lead Channel Setting Pacing Amplitude: 1.875
Lead Channel Setting Pacing Amplitude: 2.125
Lead Channel Setting Pacing Amplitude: 2.25 V
Lead Channel Setting Pacing Pulse Width: 0.4 ms
Lead Channel Setting Pacing Pulse Width: 0.7 ms
Lead Channel Setting Sensing Sensitivity: 2 mV
Pulse Gen Model: 3242
Pulse Gen Serial Number: 7701275

## 2017-03-23 ENCOUNTER — Encounter: Payer: Self-pay | Admitting: Cardiology

## 2017-04-01 DIAGNOSIS — J209 Acute bronchitis, unspecified: Secondary | ICD-10-CM | POA: Diagnosis not present

## 2017-04-09 ENCOUNTER — Ambulatory Visit (INDEPENDENT_AMBULATORY_CARE_PROVIDER_SITE_OTHER): Payer: Medicare Other | Admitting: Internal Medicine

## 2017-04-09 ENCOUNTER — Encounter (INDEPENDENT_AMBULATORY_CARE_PROVIDER_SITE_OTHER): Payer: Self-pay

## 2017-04-09 ENCOUNTER — Encounter: Payer: Self-pay | Admitting: Internal Medicine

## 2017-04-09 DIAGNOSIS — I442 Atrioventricular block, complete: Secondary | ICD-10-CM | POA: Diagnosis not present

## 2017-04-09 DIAGNOSIS — Z950 Presence of cardiac pacemaker: Secondary | ICD-10-CM | POA: Diagnosis not present

## 2017-04-09 NOTE — Patient Instructions (Addendum)
Medication Instructions:  Your physician recommends that you continue on your current medications as directed. Please refer to the Current Medication list given to you today.  *If you need a refill on your cardiac medications before your next appointment, please call your pharmacy*  Labwork: None ordered  Testing/Procedures: None ordered  Follow-Up: Remote monitoring is used to monitor your Pacemaker or ICD from home. This monitoring reduces the number of office visits required to check your device to one time per year. It allows [place] to keep an eye on the functioning of your device to ensure it is working properly. You are scheduled for a device check from home on 06/20/2017. You may send your transmission at any time that day. If you have a wireless device, the transmission will be sent automatically. After your physician reviews your transmission, you will receive a postcard with your next transmission date.  Your physician wants you to follow-up in: 1 year with Dr. Caryl Comes.  You will receive a reminder letter in the mail two months in advance. If you don't receive a letter, please call our office to schedule the follow-up appointment.  Thank you for choosing [facility]!!

## 2017-04-09 NOTE — Progress Notes (Signed)
Electrophysiology Office Note   Date:  04/09/2017   ID:  William Villanueva, DOB Jul 24, 1950, MRN [id number]  PCP:  [name], MD  Cardiologist:  [facility] Primary Electrophysiologist:    [name], MD    Chief Complaint  Patient presents with  . Pacemaker Check    Nonischemic cardiomyopathy/Complete heart block     History of Present Illness: William Villanueva is a 66 y.o. male is   seen in follow-up for CRT-P implantation for nonischemic cardiomyopathy. He had presented to 12/15 with symptomatic high-grade heart block. Echo reevaluation 2/16 demonstrated near normalization of LV systolic function; he been readmitted at that time because of pain which dated back to his device implantation. Nuclear medicine 2/16 had an EF of 35%   3/17 Myoview 32% .  Continues to have problems with chest pain.  It is aggravated by coughing.  Been going on for months and months.  He continues to struggle with his weight now up about 50 pounds   Past Medical History:  Diagnosis Date  . AV block, 2nd degree 2015   St. Jude Allure Quadra pulse generator [id number], model PM 3242  . Back pain   . Cardiomyopathy ([place])    Nonischemic 45%.   . CHF (congestive heart failure) ([place])   . Gout   . Hemochromatosis   . Hypertension   . Hypospadias [birth year]   born with  . Nephrolithiasis   . 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 ANKLE SURGERY    . BI-VENTRICULAR PACEMAKER INSERTION N/A 04/29/2014   Procedure: BI-VENTRICULAR PACEMAKER INSERTION (CRT-P);  Surgeon: Deboraha Sprang, MD; Laterality: Left  St. Jude Allure Quadra pulse generator [id number], model PM [phone]  . PILONIDAL CYST EXCISION    . POSTERIOR LAMINECTOMY / DECOMPRESSION LUMBAR SPINE    . TONSILLECTOMY       Current Outpatient Medications  Medication Sig Dispense Refill  . amoxicillin (AMOXIL) 500 MG capsule     . aspirin 81 MG tablet Take 81 mg by mouth daily.    . carvedilol (COREG) 12.5  MG tablet Take 1 tablet (12.5 mg total) by mouth 2 (two) times daily with a meal. 180 tablet 3  . citalopram (CELEXA) 10 MG tablet Take 5 mg by mouth daily.     . furosemide (LASIX) 20 MG tablet Take 1 tablet (20 mg total) by mouth daily. 90 tablet 1  . hydroxyurea (HYDREA) 500 MG capsule Take 1 capsule (500 mg total) by mouth See admin instructions. Take one tablet every other day and two tablets the other days. 135 capsule 3  . mesalamine (APRISO) 0.375 g 24 hr capsule Take 375 mg by mouth as directed.    . Methotrexate Sodium (METHOTREXATE, PF,) 250 MG/10ML injection Inject 6 mg into the vein once a week.    [name] omeprazole (PRILOSEC) 20 MG capsule Take 20 mg by mouth 2 (two) times daily before a meal.     . Probiotic Product (ALIGN PO) Take by mouth as directed.    . sacubitril-valsartan (ENTRESTO) 24-26 MG Take 1 tablet by mouth 2 (two) times daily. 180 tablet 1  . silodosin (RAPAFLO) 8 MG CAPS capsule Take 8 mg by mouth daily.     [name] spironolactone (ALDACTONE) 25 MG tablet TAKE 1/2 TABLET BY MOUTH DAILY 45 tablet 3  . traMADol (ULTRAM) 50 MG tablet     . ULORIC 80 MG TABS Take 1 tablet by mouth daily.  No current facility-administered medications for this visit.    Facility-Administered Medications Ordered in Other Visits  Medication Dose Route Frequency Provider Last Rate Last Dose  . 0.9 %  sodium chloride infusion   Intravenous Continuous Volanda Napoleon, MD   Stopped at 05/16/13 1115    Allergies:   Prednisone; Prozac [fluoxetine hcl]; Temazepam; Trazodone and nefazodone; Ibuprofen; and Wellbutrin [bupropion]   Social History:  The patient  reports that he quit smoking about 4 years ago. His smoking use included cigarettes. He started smoking about 48 years ago. He has a 44.00 pack-year smoking history. he has never used smokeless tobacco. He reports that he drinks alcohol. He reports that he does not use drugs.   Family History:  The patient's    family history includes Diabetes  in his sister; Heart disease in his maternal grandmother; Hypertension in his sister; Other in his father; Stroke in his mother.    ROS:  Please see the history of present illness and past medical history  Otherwise, all other systems were reviewed and were negative except .     PHYSICAL EXAM: VS:  BP 126/74   Pulse 79   Ht 5\' 10"  (1.778 m)   Wt (!) 310 lb 9.6 oz (140.9 kg)   BMI 44.57 kg/m  , BMI Body mass index is 44.57 kg/m. Well developed and nourished in no acute distress HENT normal Neck supple with JVP-flat Clear Chest wall tenderness over his right costochondral junctions ,Device pocket well healed; without hematoma or erythema.  There is no tethering  Regular rate and rhythm, no murmurs or gallops Abd-soft with active BS No Clubbing cyanosis edema Skin-warm and dry A & Oriented  Grossly normal sensory and motor function    EKG: Sinus with P synchronous pacing RS lead V1 QR  in lead I QRS duration 126 Device interrogation is reviewed today in detail.  See PaceArt for details.   Recent Labs: 05/27/2016: Magnesium 1.2 02/20/2017: ALT(SGPT) 39; BUN, Bld 16; Creat 1.1; HGB 13.8; Platelets 261; Potassium 3.9; Sodium 138    Lipid Panel     Component Value Date/Time   CHOL 126 04/29/2014 0139   TRIG 146 04/29/2014 0139   HDL 29 (L) 04/29/2014 0139   CHOLHDL 4.3 04/29/2014 0139   VLDL 29 04/29/2014 0139   LDLCALC 68 04/29/2014 0139     Wt Readings from Last 3 Encounters:  04/09/17 (!) 310 lb 9.6 oz (140.9 kg)  02/20/17 (!) 309 lb (140.2 kg)  01/29/17 (!) 304 lb 9.6 oz (138.2 kg)      Other studies Reviewed: Additional studies/ records that were reviewed today include Dr Morrison Old notes describing the discontinuation of ACE and substitiution with ARB  ASSESSMENT AND PLAN:  NICM  CHF chronic sys/HFpFF  Hypertension  Costochondritis   Pacemaker-CRT-St. Jude The patient's device was interrogated.  The information was reviewed. Changes were made to address the  above   Euvolemic continue current meds  Suggested using a cough pillow to try and help his sternal inflammation ''  BP well controlled  Encouraged in weight loss       Current medicines are reviewed at length with the patient today.   The patient has concerns regarding his medicines As above  Labs/ tests ordered today include:   TSH   Disposition:   FU with AS 12 m Signed, [name], MD  04/09/2017 4:05 PM     [place] [id number] [phone] (office) (  [phone] (fax)

## 2017-04-10 ENCOUNTER — Other Ambulatory Visit: Payer: Self-pay

## 2017-04-10 ENCOUNTER — Ambulatory Visit (HOSPITAL_BASED_OUTPATIENT_CLINIC_OR_DEPARTMENT_OTHER): Payer: Medicare Other | Admitting: Hematology & Oncology

## 2017-04-10 ENCOUNTER — Other Ambulatory Visit (HOSPITAL_BASED_OUTPATIENT_CLINIC_OR_DEPARTMENT_OTHER): Payer: Medicare Other

## 2017-04-10 VITALS — BP 138/72 | HR 85 | Temp 97.5°F | Resp 22 | Wt 309.0 lb

## 2017-04-10 DIAGNOSIS — D45 Polycythemia vera: Secondary | ICD-10-CM

## 2017-04-10 DIAGNOSIS — D473 Essential (hemorrhagic) thrombocythemia: Secondary | ICD-10-CM

## 2017-04-10 LAB — IRON AND TIBC
%SAT: 7 % — ABNORMAL LOW (ref 20–55)
Iron: 29 ug/dL — ABNORMAL LOW (ref 42–163)
TIBC: 390 ug/dL (ref 202–409)
UIBC: 362 ug/dL (ref 117–376)

## 2017-04-10 LAB — CBC WITH DIFFERENTIAL (CANCER CENTER ONLY)
BASO#: 0.4 10*3/uL — ABNORMAL HIGH (ref 0.0–0.2)
BASO%: 2.9 % — ABNORMAL HIGH (ref 0.0–2.0)
EOS%: 2.3 % (ref 0.0–7.0)
Eosinophils Absolute: 0.3 10*3/uL (ref 0.0–0.5)
HCT: 42.7 % (ref 38.7–49.9)
HGB: 13.5 g/dL (ref 13.0–17.1)
LYMPH#: 1.6 10*3/uL (ref 0.9–3.3)
LYMPH%: 12.3 % — ABNORMAL LOW (ref 14.0–48.0)
MCH: 30.2 pg (ref 28.0–33.4)
MCHC: 31.6 g/dL — ABNORMAL LOW (ref 32.0–35.9)
MCV: 96 fL (ref 82–98)
MONO#: 1.1 10*3/uL — ABNORMAL HIGH (ref 0.1–0.9)
MONO%: 8 % (ref 0.0–13.0)
NEUT#: 9.9 10*3/uL — ABNORMAL HIGH (ref 1.5–6.5)
NEUT%: 74.5 % (ref 40.0–80.0)
Platelets: 563 10*3/uL — ABNORMAL HIGH (ref 145–400)
RBC: 4.47 10*6/uL (ref 4.20–5.70)
RDW: 16.5 % — ABNORMAL HIGH (ref 11.1–15.7)
WBC: 13.3 10*3/uL — ABNORMAL HIGH (ref 4.0–10.0)

## 2017-04-10 LAB — CMP (CANCER CENTER ONLY)
ALT(SGPT): 32 U/L (ref 10–47)
AST: 27 U/L (ref 11–38)
Albumin: 3.3 g/dL (ref 3.3–5.5)
Alkaline Phosphatase: 77 U/L (ref 26–84)
BUN, Bld: 12 mg/dL (ref 7–22)
CO2: 24 mEq/L (ref 18–33)
Calcium: 8.4 mg/dL (ref 8.0–10.3)
Chloride: 108 mEq/L (ref 98–108)
Creat: 1.1 mg/dl (ref 0.6–1.2)
Glucose, Bld: 174 mg/dL — ABNORMAL HIGH (ref 73–118)
Potassium: 3.5 mEq/L (ref 3.3–4.7)
Sodium: 141 mEq/L (ref 128–145)
Total Bilirubin: 0.7 mg/dl (ref 0.20–1.60)
Total Protein: 7 g/dL (ref 6.4–8.1)

## 2017-04-10 LAB — LACTATE DEHYDROGENASE: LDH: 279 U/L — ABNORMAL HIGH (ref 125–245)

## 2017-04-10 LAB — FERRITIN: Ferritin: 19 ng/ml — ABNORMAL LOW (ref 22–316)

## 2017-04-10 LAB — TECHNOLOGIST REVIEW CHCC SATELLITE

## 2017-04-10 MED ORDER — KETOROLAC TROMETHAMINE 10 MG PO TABS: 10.0000 mg | ORAL_TABLET | Freq: Three times a day (TID) | ORAL | 0 refills | Status: AC

## 2017-04-10 NOTE — Progress Notes (Signed)
Hematology and Oncology Follow Up Visit  William Villanueva [id number] 11-May-1950 66 y.o. 04/10/2017   Principle Diagnosis:   Polycythemia vera-JAK2 positive  Heart block-Mobitz II  Current Therapy:    Hydrea 558m po daily alternating with 1000 mg p.o.daily -dose changed on 04/10/2017  Aspirin 81 mg by mouth daily  Phlebotomy to maintain hematocrit below 45%     Interim History:  Mr.  BCrabbeis back for followup.  He had she is looking quite good.  He feels good.  [place] and his wife were up there for Thanksgiving.   He saw his cardiologist yesterday.  His pacemaker is doing well.  He has had no problems with nausea or vomiting.  He has had no change in bowel or bladder habits.  He has had no headache.  There is been no change in medications.  His back, as always, has been bothering him.  However, it seems to be doing a little bit better today.   Overall, his performance status is ECOG 1..   Medications:  Current Outpatient Medications:  .  amoxicillin (AMOXIL) 500 MG capsule, , Disp: , Rfl:  .  aspirin 81 MG tablet, Take 81 mg by mouth daily., Disp: , Rfl:  .  carvedilol (COREG) 12.5 MG tablet, Take 1 tablet (12.5 mg total) by mouth 2 (two) times daily with a meal., Disp: 180 tablet, Rfl: 3 .  citalopram (CELEXA) 10 MG tablet, Take 5 mg by mouth daily. , Disp: , Rfl:  .  furosemide (LASIX) 20 MG tablet, Take 1 tablet (20 mg total) by mouth daily., Disp: 90 tablet, Rfl: 1 .  hydroxyurea (HYDREA) 500 MG capsule, Take 1 capsule (500 mg total) by mouth See admin instructions. Take one tablet every other day and two tablets the other days., Disp: 135 capsule, Rfl: 3 .  ketorolac (TORADOL) 10 MG tablet, Take 1 tablet (10 mg total) by mouth every 8 (eight) hours for 6 doses., Disp: 6 tablet, Rfl: 0 .  mesalamine (APRISO) 0.375 g 24 hr capsule, Take 375 mg by mouth as directed., Disp: , Rfl:  .  Methotrexate Sodium (METHOTREXATE, PF,) 250 MG/10ML  injection, Inject 6 mg into the vein once a week., Disp: , Rfl:  .  omeprazole (PRILOSEC) 20 MG capsule, Take 20 mg by mouth 2 (two) times daily before a meal. , Disp: , Rfl:  .  Probiotic Product (ALIGN PO), Take by mouth as directed., Disp: , Rfl:  .  sacubitril-valsartan (ENTRESTO) 24-26 MG, Take 1 tablet by mouth 2 (two) times daily., Disp: 180 tablet, Rfl: 1 .  silodosin (RAPAFLO) 8 MG CAPS capsule, Take 8 mg by mouth daily. , Disp: , Rfl:  .  spironolactone (ALDACTONE) 25 MG tablet, TAKE 1/2 TABLET BY MOUTH DAILY, Disp: 45 tablet, Rfl: 3 .  traMADol (ULTRAM) 50 MG tablet, , Disp: , Rfl:  .  ULORIC 80 MG TABS, Take 1 tablet by mouth daily., Disp: , Rfl:  No current facility-administered medications for this visit.   Facility-Administered Medications Ordered in Other Visits:  .  0.9 %  sodium chloride infusion, , Intravenous, Continuous, Margarito Dehaas, PRudell Cobb MD, Stopped at 05/16/13 1115  Allergies:  Allergies  Allergen Reactions  . Prednisone Itching and Other (See Comments)    Abdominal pain  . Prozac [Fluoxetine Hcl] Itching  . Temazepam Other (See Comments)    Dizziness   . Trazodone And Nefazodone Other (See Comments)    Dizziness  . Ibuprofen Itching  .  Wellbutrin [Bupropion] Hives    Past Medical History, Surgical history, Social history, and Family History were reviewed and updated.  Review of Systems: As stated in the interim history  Physical Exam:  weight is 309 lb (140.2 kg) (abnormal). His oral temperature is 97.5 F (36.4 C) (abnormal). His blood pressure is 138/72 and his pulse is 85. His respiration is 22 (abnormal).   I examined William Villanueva. The results of my examination are noted below with appropriate changes:   Obese white male in no obvious distress. There is no adenopathy in the neck. Lungs are clear. Cardiac exam regular rate and rhythm. He has an occasional extra beat. He has no murmurs, rubs or bruits. Abdomen is obese but soft. Has good bowel sounds.  There is no fluid wave. There is a palpable liver or spleen tip. Back exam shows a well healed laminectomy scar in the lumbar spine. Extremities shows no clubbing cyanosis or edema. He has good range of motion of his joints. He has good strength. Skin exam no rashes, ecchymoses or petechia. Neurological exam is nonfocal.  Lab Results  Component Value Date   WBC 13.3 (H) 04/10/2017   HGB 13.5 04/10/2017   HCT 42.7 04/10/2017   MCV 96 04/10/2017   PLT 563 (H) 04/10/2017     Chemistry      Component Value Date/Time   NA 141 04/10/2017 0923   NA 140 04/05/2016 0739   K 3.5 04/10/2017 0923   K 4.2 04/05/2016 0739   CL 108 04/10/2017 0923   CO2 24 04/10/2017 0923   CO2 20 (L) 04/05/2016 0739   BUN 12 04/10/2017 0923   BUN 14.7 04/05/2016 0739   CREATININE 1.1 04/10/2017 0923   CREATININE 1.1 04/05/2016 0739      Component Value Date/Time   CALCIUM 8.4 04/10/2017 0923   CALCIUM 9.0 04/05/2016 0739   ALKPHOS 77 04/10/2017 0923   ALKPHOS 91 04/05/2016 0739   AST 27 04/10/2017 0923   AST 34 04/05/2016 0739   ALT 32 04/10/2017 0923   ALT 40 04/05/2016 0739   BILITOT 0.70 04/10/2017 0923   BILITOT 0.48 04/05/2016 0739         Impression and Plan: William Villanueva is 66 year old gentleman with polycythemia vera.  Unfortunately, we will have to adjust his Hydrea dose.  I think that we will have to have him alternate 500 mg p.o. daily with 1000 mg p.o. daily.  I want to see his white cell count and platelet count come down a little bit more.  He is okay with this.  I will plan to see him back in 6 weeks.  Hopefully, everything will be stable at that point.  Volanda Napoleon, MD 12/4/201810:42 AM

## 2017-04-11 LAB — RETICULOCYTES: Reticulocyte Count: 2.6 % (ref 0.6–2.6)

## 2017-04-18 ENCOUNTER — Other Ambulatory Visit: Payer: Self-pay | Admitting: Internal Medicine

## 2017-04-18 ENCOUNTER — Other Ambulatory Visit: Payer: Self-pay | Admitting: *Deleted

## 2017-04-18 DIAGNOSIS — D45 Polycythemia vera: Secondary | ICD-10-CM

## 2017-04-18 DIAGNOSIS — D473 Essential (hemorrhagic) thrombocythemia: Secondary | ICD-10-CM

## 2017-04-18 MED ORDER — TRAMADOL HCL 50 MG PO TABS: 50.0000 mg | ORAL_TABLET | Freq: Four times a day (QID) | ORAL | 0 refills | Status: DC | PRN

## 2017-04-24 ENCOUNTER — Other Ambulatory Visit: Payer: Self-pay | Admitting: Hematology & Oncology

## 2017-04-24 ENCOUNTER — Telehealth: Payer: Self-pay | Admitting: *Deleted

## 2017-04-24 DIAGNOSIS — D45 Polycythemia vera: Secondary | ICD-10-CM

## 2017-04-24 NOTE — Telephone Encounter (Signed)
Patient is c/o chest pain. He states he c/o of this symptom a few weeks ago when seen by Dr Marin Olp. Dr Marin Olp gave him "6 pills" which helped. When the pills ran out, his chest pain returned. Dr Marin Olp gave him a prescription last week for Tramadol, but this has been ineffective. They would like something else.  Spoke with Dr Marin Olp. He is requesting that the patient follow up with his primary care physician.   Informed patient of this recommendation. He understood.

## 2017-04-27 DIAGNOSIS — R197 Diarrhea, unspecified: Secondary | ICD-10-CM | POA: Diagnosis not present

## 2017-04-27 DIAGNOSIS — K529 Noninfective gastroenteritis and colitis, unspecified: Secondary | ICD-10-CM | POA: Diagnosis not present

## 2017-04-27 DIAGNOSIS — K52838 Other microscopic colitis: Secondary | ICD-10-CM | POA: Diagnosis not present

## 2017-04-30 DIAGNOSIS — M940 Chondrocostal junction syndrome [Tietze]: Secondary | ICD-10-CM | POA: Diagnosis not present

## 2017-05-04 DIAGNOSIS — A0472 Enterocolitis due to Clostridium difficile, not specified as recurrent: Secondary | ICD-10-CM | POA: Diagnosis not present

## 2017-05-07 ENCOUNTER — Other Ambulatory Visit: Payer: Self-pay

## 2017-05-07 DIAGNOSIS — R0602 Shortness of breath: Secondary | ICD-10-CM

## 2017-05-07 MED ORDER — SPIRONOLACTONE 25 MG PO TABS: 12.5000 mg | ORAL_TABLET | Freq: Every day | ORAL | 3 refills | Status: DC

## 2017-05-14 ENCOUNTER — Other Ambulatory Visit: Payer: Self-pay

## 2017-05-14 DIAGNOSIS — I429 Cardiomyopathy, unspecified: Secondary | ICD-10-CM

## 2017-05-14 MED ORDER — CARVEDILOL 12.5 MG PO TABS: 12.5000 mg | ORAL_TABLET | Freq: Two times a day (BID) | ORAL | 2 refills | Status: DC

## 2017-05-14 NOTE — Telephone Encounter (Signed)
Rx(s) sent to pharmacy electronically.  

## 2017-05-18 ENCOUNTER — Encounter (HOSPITAL_COMMUNITY)
Admission: RE | Admit: 2017-05-18 | Discharge: 2017-05-18 | Disposition: A | Discharge status: Home / Self Care | Payer: Medicare Other | Source: Ambulatory Visit | Attending: Hematology & Oncology | Admitting: Hematology & Oncology

## 2017-05-18 ENCOUNTER — Ambulatory Visit (HOSPITAL_COMMUNITY)
Admission: RE | Admit: 2017-05-18 | Discharge: 2017-05-18 | Disposition: A | Discharge status: Home / Self Care | Payer: Medicare Other | Source: Ambulatory Visit | Attending: Hematology & Oncology | Admitting: Hematology & Oncology

## 2017-05-18 DIAGNOSIS — M47894 Other spondylosis, thoracic region: Secondary | ICD-10-CM | POA: Diagnosis not present

## 2017-05-18 DIAGNOSIS — D45 Polycythemia vera: Secondary | ICD-10-CM | POA: Diagnosis not present

## 2017-05-18 DIAGNOSIS — M47896 Other spondylosis, lumbar region: Secondary | ICD-10-CM | POA: Diagnosis not present

## 2017-05-18 MED ORDER — TECHNETIUM TC 99M MEDRONATE IV KIT
20.9000 | PACK | Freq: Once | INTRAVENOUS | Status: AC | PRN
  Administered 2017-05-18: 20.9 via INTRAVENOUS

## 2017-05-22 ENCOUNTER — Telehealth: Payer: Self-pay | Admitting: *Deleted

## 2017-05-22 NOTE — Telephone Encounter (Addendum)
Patient is aware of results  ----- Message from Volanda Napoleon, MD sent at 05/18/2017  5:59 PM EST ----- Call - NO problem seen on the bone scan.  NO fracture seen. Some arthritis.  pete

## 2017-05-24 ENCOUNTER — Other Ambulatory Visit: Payer: 59

## 2017-05-24 ENCOUNTER — Ambulatory Visit: Payer: 59 | Admitting: Hematology & Oncology

## 2017-05-25 LAB — CUP PACEART INCLINIC DEVICE CHECK
Battery Remaining Longevity: 84 mo
Battery Voltage: 2.96 V
Brady Statistic RA Percent Paced: 9 %
Brady Statistic RV Percent Paced: 99 %
Date Time Interrogation Session: 20181203212831
Implantable Lead Implant Date: 20151223
Implantable Lead Implant Date: 20151223
Implantable Lead Implant Date: 20151223
Implantable Lead Location: 753858
Implantable Lead Location: 753859
Implantable Lead Location: 753860
Implantable Pulse Generator Implant Date: 20151223
Lead Channel Impedance Value: 450 Ohm
Lead Channel Impedance Value: 450 Ohm
Lead Channel Impedance Value: 737.5 Ohm
Lead Channel Pacing Threshold Amplitude: 1 V
Lead Channel Pacing Threshold Amplitude: 1 V
Lead Channel Pacing Threshold Amplitude: 1 V
Lead Channel Pacing Threshold Amplitude: 1.25 V
Lead Channel Pacing Threshold Pulse Width: 0.4 ms
Lead Channel Pacing Threshold Pulse Width: 0.4 ms
Lead Channel Pacing Threshold Pulse Width: 0.4 ms
Lead Channel Pacing Threshold Pulse Width: 0.7 ms
Lead Channel Sensing Intrinsic Amplitude: 11.1 mV
Lead Channel Sensing Intrinsic Amplitude: 4.3 mV
Lead Channel Setting Pacing Amplitude: 1.75 V
Lead Channel Setting Pacing Amplitude: 2.125
Lead Channel Setting Pacing Amplitude: 2.375
Lead Channel Setting Pacing Pulse Width: 0.4 ms
Lead Channel Setting Pacing Pulse Width: 0.7 ms
Lead Channel Setting Sensing Sensitivity: 2 mV
Pulse Gen Model: 3242
Pulse Gen Serial Number: 7701275

## 2017-05-29 ENCOUNTER — Inpatient Hospital Stay (HOSPITAL_BASED_OUTPATIENT_CLINIC_OR_DEPARTMENT_OTHER): Payer: Medicare Other | Admitting: Hematology & Oncology

## 2017-05-29 ENCOUNTER — Inpatient Hospital Stay: Payer: Medicare Other | Attending: Hematology & Oncology

## 2017-05-29 DIAGNOSIS — D473 Essential (hemorrhagic) thrombocythemia: Secondary | ICD-10-CM

## 2017-05-29 DIAGNOSIS — D45 Polycythemia vera: Secondary | ICD-10-CM

## 2017-05-29 DIAGNOSIS — I441 Atrioventricular block, second degree: Secondary | ICD-10-CM | POA: Insufficient documentation

## 2017-05-29 DIAGNOSIS — Z7982 Long term (current) use of aspirin: Secondary | ICD-10-CM | POA: Diagnosis not present

## 2017-05-29 DIAGNOSIS — Z950 Presence of cardiac pacemaker: Secondary | ICD-10-CM

## 2017-05-29 DIAGNOSIS — R0602 Shortness of breath: Secondary | ICD-10-CM

## 2017-05-29 LAB — CBC WITH DIFFERENTIAL (CANCER CENTER ONLY)
Basophils Absolute: 0.3 10*3/uL — ABNORMAL HIGH (ref 0.0–0.1)
Basophils Relative: 3 %
Eosinophils Absolute: 0.3 10*3/uL (ref 0.0–0.5)
Eosinophils Relative: 3 %
HCT: 45.5 % (ref 38.7–49.9)
Hemoglobin: 14.2 g/dL (ref 13.0–17.1)
Lymphocytes Relative: 14 %
Lymphs Abs: 1.4 10*3/uL (ref 0.9–3.3)
MCH: 30.1 pg (ref 28.0–33.4)
MCHC: 31.2 g/dL — ABNORMAL LOW (ref 32.0–35.9)
MCV: 96.6 fL (ref 82.0–98.0)
Monocytes Absolute: 0.8 10*3/uL (ref 0.1–0.9)
Monocytes Relative: 8 %
Neutro Abs: 7.2 10*3/uL — ABNORMAL HIGH (ref 1.5–6.5)
Neutrophils Relative %: 72 %
Platelet Count: 302 10*3/uL (ref 140–400)
RBC: 4.71 MIL/uL (ref 4.20–5.70)
RDW: 17 % — ABNORMAL HIGH (ref 11.1–15.7)
WBC Count: 10 10*3/uL (ref 4.0–10.3)

## 2017-05-29 LAB — CMP (CANCER CENTER ONLY)
ALT: 32 U/L (ref 0–55)
AST: 33 U/L (ref 5–34)
Albumin: 3.7 g/dL (ref 3.5–5.0)
Alkaline Phosphatase: 81 U/L (ref 26–84)
Anion gap: 9 (ref 5–15)
BUN: 11 mg/dL (ref 7–22)
CO2: 24 mmol/L (ref 18–33)
Calcium: 8.9 mg/dL (ref 8.0–10.3)
Chloride: 109 mmol/L — ABNORMAL HIGH (ref 98–108)
Creatinine: 1.1 mg/dL (ref 0.70–1.30)
Glucose, Bld: 204 mg/dL — ABNORMAL HIGH (ref 70–118)
Potassium: 3.5 mmol/L (ref 3.3–4.7)
Sodium: 142 mmol/L (ref 128–145)
Total Bilirubin: 0.7 mg/dL (ref 0.2–1.2)
Total Protein: 7.4 g/dL (ref 6.4–8.1)

## 2017-05-29 LAB — SAVE SMEAR

## 2017-05-29 LAB — LACTATE DEHYDROGENASE: LDH: 261 U/L — ABNORMAL HIGH (ref 125–245)

## 2017-05-30 NOTE — Progress Notes (Signed)
Hematology and Oncology Follow Up Visit  William Villanueva [id number] Nov 24, 1950 67 y.o. 05/30/2017   Principle Diagnosis:   Polycythemia vera-JAK2 positive  Heart block-Mobitz II  Current Therapy:    Hydrea 521m po daily alternating with 1000 mg p.o.daily -dose changed on 04/10/2017  Aspirin 81 mg by mouth daily  Phlebotomy to maintain hematocrit below 45%     Interim History:  [name] back for followup.  He is doing okay.  His problem is that he gets short of breath.  He is quite overweight.  I am sure that he has some underlying COPD.  William Villanueva has never seen a pulmonologist.  I suspect he will need some pulmonary function test.  He does not have any further chest wall pain.  We did do a bone scan on him.  The bone scan did not show any evidence of any malignancy or fracture.  Once he started taking Tylenol Arthritis, he has no longer had any sternal pain.  His appetite is doing okay.  Is on no problems with bowels or bladder.  He has had no cardiac issues.  He has the pacemaker in place.  His last iron studies showed a ferritin of 19 with iron saturation of 7% back in December.  Performance status is ECOG 1.  Medications:  Current Outpatient Medications:  .  aspirin 81 MG tablet, Take 81 mg by mouth daily., Disp: , Rfl:  .  carvedilol (COREG) 12.5 MG tablet, Take 1 tablet (12.5 mg total) by mouth 2 (two) times daily with a meal., Disp: 180 tablet, Rfl: 2 .  citalopram (CELEXA) 10 MG tablet, Take 5 mg by mouth daily. , Disp: , Rfl:  .  furosemide (LASIX) 20 MG tablet, Take 1 tablet (20 mg total) by mouth daily., Disp: 90 tablet, Rfl: 1 .  hydroxyurea (HYDREA) 500 MG capsule, Take 1 capsule (500 mg total) by mouth See admin instructions. Take one tablet every other day and two tablets the other days., Disp: 135 capsule, Rfl: 3 .  mesalamine (APRISO) 0.375 g 24 hr capsule, Take 375 mg by mouth as directed., Disp: , Rfl:  .  Methotrexate Sodium (METHOTREXATE, PF,) 250  MG/10ML injection, Inject 6 mg into the vein once a week., Disp: , Rfl:  .  omeprazole (PRILOSEC) 20 MG capsule, Take 20 mg by mouth 2 (two) times daily before a meal. , Disp: , Rfl:  .  Probiotic Product (ALIGN PO), Take by mouth as directed., Disp: , Rfl:  .  sacubitril-valsartan (ENTRESTO) 24-26 MG, Take 1 tablet by mouth 2 (two) times daily., Disp: 180 tablet, Rfl: 1 .  silodosin (RAPAFLO) 8 MG CAPS capsule, Take 8 mg by mouth daily. , Disp: , Rfl:  .  spironolactone (ALDACTONE) 25 MG tablet, Take 0.5 tablets (12.5 mg total) by mouth daily., Disp: 45 tablet, Rfl: 3 .  traMADol (ULTRAM) 50 MG tablet, Take 1 tablet (50 mg total) by mouth every 6 (six) hours as needed., Disp: 30 tablet, Rfl: 0 .  ULORIC 80 MG TABS, Take 1 tablet by mouth daily., Disp: , Rfl:  No current facility-administered medications for this visit.   Facility-Administered Medications Ordered in Other Visits:  .  0.9 %  sodium chloride infusion, , Intravenous, Continuous, William Villanueva, William Cobb MD, Stopped at 05/16/13 1115  Allergies:  Allergies  Allergen Reactions  . Prednisone Itching and Other (See Comments)    Abdominal pain  . Prozac [Fluoxetine Hcl] Itching  . Temazepam Other (See Comments)  Dizziness   . Trazodone And Nefazodone Other (See Comments)    Dizziness  . Ibuprofen Itching  . Wellbutrin [Bupropion] Hives    Past Medical History, Surgical history, Social history, and Family History were reviewed and updated.  Review of Systems: Review of Systems  Constitutional: Negative.   HENT: Negative.   Eyes: Negative.   Respiratory: Positive for shortness of breath.   Cardiovascular: Positive for chest pain.  Gastrointestinal: Negative.   Genitourinary: Negative.   Musculoskeletal: Positive for back pain.  Skin: Negative.   Neurological: Negative.   Endo/Heme/Allergies: Negative.   Psychiatric/Behavioral: Negative.     Physical Exam:  vitals were not taken for this visit.   Physical Exam   Constitutional: He is oriented to person, place, and time.  HENT:  Head: Normocephalic and atraumatic.  Mouth/Throat: Oropharynx is clear and moist.  Eyes: EOM are normal. Pupils are equal, round, and reactive to light.  Neck: Normal range of motion.  Cardiovascular: Normal rate, regular rhythm and normal heart sounds.  Pulmonary/Chest: Effort normal and breath sounds normal.  Abdominal: Soft. Bowel sounds are normal.  Musculoskeletal: Normal range of motion. He exhibits no edema, tenderness or deformity.  Lymphadenopathy:    He has no cervical adenopathy.  Neurological: He is alert and oriented to person, place, and time.  Skin: Skin is warm and dry. No rash noted. No erythema.  Psychiatric: He has a normal mood and affect. His behavior is normal. Judgment and thought content normal.  Vitals reviewed.    Lab Results  Component Value Date   WBC 10.0 05/29/2017   HGB 13.5 04/10/2017   HCT 45.5 05/29/2017   MCV 96.6 05/29/2017   PLT 302 05/29/2017     Chemistry      Component Value Date/Time   NA 142 05/29/2017 0915   NA 141 04/10/2017 0923   NA 140 04/05/2016 0739   K 3.5 05/29/2017 0915   K 3.5 04/10/2017 0923   K 4.2 04/05/2016 0739   CL 109 (H) 05/29/2017 0915   CL 108 04/10/2017 0923   CO2 24 05/29/2017 0915   CO2 24 04/10/2017 0923   CO2 20 (L) 04/05/2016 0739   BUN 11 05/29/2017 0915   BUN 12 04/10/2017 0923   BUN 14.7 04/05/2016 0739   CREATININE 1.1 04/10/2017 0923   CREATININE 1.1 04/05/2016 0739      Component Value Date/Time   CALCIUM 8.9 05/29/2017 0915   CALCIUM 8.4 04/10/2017 0923   CALCIUM 9.0 04/05/2016 0739   ALKPHOS 81 05/29/2017 0915   ALKPHOS 77 04/10/2017 0923   ALKPHOS 91 04/05/2016 0739   AST 33 05/29/2017 0915   AST 34 04/05/2016 0739   ALT 32 05/29/2017 0915   ALT 32 04/10/2017 0923   ALT 40 04/05/2016 0739   BILITOT 0.7 05/29/2017 0915   BILITOT 0.48 04/05/2016 0739      Impression and Plan: [name] is 67 year old  gentleman with polycythemia vera.  He has barely above 45 with hematocrit.  As such, we will hold off on doing a phlebotomy.  His platelet counts come down nicely.  I will not we adjust his Hydrea dose.  I would like to see about getting a pulmonary function test on him.  I suspect that he probably has underlying COPD.  I do suspect he probably will need to be seen by pulmonary medicine for his breathing issues.  We will plan to get him back in another month or so.  Volanda Napoleon, MD 1/23/20197:27  AM

## 2017-06-04 ENCOUNTER — Ambulatory Visit (HOSPITAL_COMMUNITY)
Admission: RE | Admit: 2017-06-04 | Discharge: 2017-06-04 | Disposition: A | Discharge status: Home / Self Care | Payer: Medicare Other | Source: Ambulatory Visit | Attending: Hematology & Oncology | Admitting: Hematology & Oncology

## 2017-06-04 DIAGNOSIS — Z87891 Personal history of nicotine dependence: Secondary | ICD-10-CM | POA: Insufficient documentation

## 2017-06-04 DIAGNOSIS — R0602 Shortness of breath: Secondary | ICD-10-CM | POA: Insufficient documentation

## 2017-06-04 DIAGNOSIS — J449 Chronic obstructive pulmonary disease, unspecified: Secondary | ICD-10-CM | POA: Insufficient documentation

## 2017-06-04 LAB — PULMONARY FUNCTION TEST
DL/VA % pred: 69 %
DL/VA: 3.18 ml/min/mmHg/L
DLCO unc % pred: 48 %
DLCO unc: 15.62 ml/min/mmHg
FEF 25-75 Post: 1.49 L/sec
FEF 25-75 Pre: 1.03 L/sec
FEF2575-%Change-Post: 44 %
FEF2575-%Pred-Post: 55 %
FEF2575-%Pred-Pre: 38 %
FEV1-%Change-Post: 8 %
FEV1-%Pred-Post: 63 %
FEV1-%Pred-Pre: 58 %
FEV1-Post: 2.17 L
FEV1-Pre: 1.99 L
FEV1FVC-%Change-Post: 8 %
FEV1FVC-%Pred-Pre: 87 %
FEV6-%Change-Post: 4 %
FEV6-%Pred-Post: 70 %
FEV6-%Pred-Pre: 67 %
FEV6-Post: 3.07 L
FEV6-Pre: 2.92 L
FEV6FVC-%Change-Post: 3 %
FEV6FVC-%Pred-Post: 104 %
FEV6FVC-%Pred-Pre: 100 %
FVC-%Change-Post: 0 %
FVC-%Pred-Post: 67 %
FVC-%Pred-Pre: 67 %
FVC-Post: 3.1 L
FVC-Pre: 3.08 L
Post FEV1/FVC ratio: 70 %
Post FEV6/FVC ratio: 99 %
Pre FEV1/FVC ratio: 65 %
Pre FEV6/FVC Ratio: 95 %
RV % pred: 118 %
RV: 2.82 L
TLC % pred: 86 %
TLC: 6.08 L

## 2017-06-04 MED ORDER — ALBUTEROL SULFATE (2.5 MG/3ML) 0.083% IN NEBU
2.5000 mg | INHALATION_SOLUTION | Freq: Once | RESPIRATORY_TRACT | Status: AC
  Administered 2017-06-04: 2.5 mg via RESPIRATORY_TRACT

## 2017-06-07 ENCOUNTER — Other Ambulatory Visit: Payer: Self-pay | Admitting: *Deleted

## 2017-06-07 DIAGNOSIS — D473 Essential (hemorrhagic) thrombocythemia: Secondary | ICD-10-CM

## 2017-06-07 DIAGNOSIS — D45 Polycythemia vera: Secondary | ICD-10-CM

## 2017-06-07 DIAGNOSIS — R0789 Other chest pain: Secondary | ICD-10-CM

## 2017-06-13 DIAGNOSIS — E1122 Type 2 diabetes mellitus with diabetic chronic kidney disease: Secondary | ICD-10-CM | POA: Diagnosis not present

## 2017-06-13 DIAGNOSIS — R05 Cough: Secondary | ICD-10-CM | POA: Diagnosis not present

## 2017-06-14 ENCOUNTER — Ambulatory Visit (INDEPENDENT_AMBULATORY_CARE_PROVIDER_SITE_OTHER): Payer: 59 | Admitting: Emergency Medicine

## 2017-06-14 ENCOUNTER — Encounter: Payer: Self-pay | Admitting: Emergency Medicine

## 2017-06-14 DIAGNOSIS — R0600 Dyspnea, unspecified: Secondary | ICD-10-CM

## 2017-06-14 DIAGNOSIS — J449 Chronic obstructive pulmonary disease, unspecified: Secondary | ICD-10-CM | POA: Diagnosis not present

## 2017-06-14 DIAGNOSIS — G4730 Sleep apnea, unspecified: Secondary | ICD-10-CM | POA: Diagnosis not present

## 2017-06-14 DIAGNOSIS — R0609 Other forms of dyspnea: Secondary | ICD-10-CM

## 2017-06-14 DIAGNOSIS — J441 Chronic obstructive pulmonary disease with (acute) exacerbation: Secondary | ICD-10-CM | POA: Insufficient documentation

## 2017-06-14 MED ORDER — TIOTROPIUM BROMIDE-OLODATEROL 2.5-2.5 MCG/ACT IN AERS: 2.0000 | INHALATION_SPRAY | Freq: Every day | RESPIRATORY_TRACT | 0 refills | Status: DC

## 2017-06-14 NOTE — Assessment & Plan Note (Signed)
Reported by his current pulmonary function testing.  Trial of Stiolto as above.

## 2017-06-14 NOTE — Assessment & Plan Note (Signed)
Multifactorial due to combined restriction and also obstruction.  His pulmonary function testing clearly shows normalized lung volumes in the setting of airflow obstruction.  He understands that he needs to work on weight loss, physical conditioning.  In the meantime I think we should treat his obstruction, and he agrees.  We will start Stiolto to see if he benefits.  We will perform walking oximetry to ensure that he does not have occult desaturation.  He would likely benefit at some point in the future with treatment of his sleep apnea.

## 2017-06-14 NOTE — Assessment & Plan Note (Signed)
Currently untreated.  He had sinus disease that he associates with wearing the mask.  We may need to revisit this as we go forward.

## 2017-06-14 NOTE — Patient Instructions (Addendum)
Start taking Stiolto 2 puffs once a day.  Depending on how you benefit from this medication we may decide to continue and order through your pharmacy. Walking oximetry today on room air Agree that we will need to work on slow steady weight loss and conditioning.  Follow with Dr Lamonte Sakai in 1 month to discuss whether you benefited from the medication.

## 2017-06-14 NOTE — Progress Notes (Signed)
Subjective:    Patient ID: William Villanueva, male    DOB: 17-Sep-1950, 67 y.o.   MRN: [id number]  HPI 67 year old former smoker (45 pack years) with a history of second-degree AV block (paced) nonischemic cardiomyopathy, polycythemia vera followed by Dr Katheran Awe.  Also with RA followed by Rheumatology, on MTX for last 3 months. He is referred today for evaluation of dyspnea.  He underwent pulmonary function testing on 06/04/17 that I have reviewed.  His spirometry shows severe obstruction, lung volumes are normal which suggest possible coexisting restriction, fusion capacity decreased and does not correct for alveolar volume.  He had a sleep study, was formerly on CPAP. He stopped it after he kept getting sinus infections, used for 1 yr,.   He describes exertional SOB progressive over over a year. He has gained 50 lbs over that time. His exercise is also limited by back pain from a work injury. He hears wheeze at rest or w exertion. He has cough, has been worse lately since a URI, mostly at night. He was treated w abx in 12/18, now started on another course yesterday. Occasional reflux.      Review of Systems  Constitutional: Negative for fever and unexpected weight change.  HENT: Positive for congestion and sneezing. Negative for dental problem, ear pain, nosebleeds, postnasal drip, rhinorrhea, sinus pressure, sore throat and trouble swallowing.   Eyes: Negative for redness and itching.  Respiratory: Positive for shortness of breath. Negative for cough, chest tightness and wheezing.   Cardiovascular: Negative for palpitations and leg swelling.  Gastrointestinal: Negative for nausea and vomiting.  Genitourinary: Negative for dysuria.  Musculoskeletal: Negative for joint swelling.  Skin: Negative for rash.  Neurological: Negative for headaches.  Hematological: Does not bruise/bleed easily.  Psychiatric/Behavioral: Negative for dysphoric mood. The patient is not nervous/anxious.    Past Medical  History:  Diagnosis Date  . AV block, 2nd degree 2015   [employer]. [employer] Allure Quadra pulse generator [id number], model PM 3242  . Back pain   . Cardiomyopathy ([place])    Nonischemic 45%.   . CHF (congestive heart failure) ([place])   . Gout   . Hemochromatosis   . Hypertension   . Hypospadias 12/13/1950   born with  . Nephrolithiasis   . 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 Heart disease Maternal Grandmother        Pacemaker, MI  . Stroke Mother   . Other Father        Deceased, car fell on him  . Diabetes Sister   . Hypertension Sister      Social History   Socioeconomic History  . Marital status: Married    Spouse name: Not on file  . Number of children: Not on file  . Years of education: Not on file  . Highest education level: Not on file  Social Needs  . Financial resource strain: Not on file  . Food insecurity - worry: Not on file  . Food insecurity - inability: Not on file  . Transportation needs - medical: Not on file  . Transportation needs - non-medical: Not on file  Occupational History  . Not on file  Tobacco Use  . Smoking status: Former Smoker    Packs/day: 1.00    Years: 44.00    Pack years: 44.00    Types: Cigarettes    Start date: 06/05/1968    Last attempt to quit: 04/05/2013    Years since  quitting: 4.1  . Smokeless tobacco: Never Used  . Tobacco comment: quit 2 years ago  Substance and Sexual Activity  . Alcohol use: Yes    Alcohol/week: 0.0 oz    Comment: Rare - once per month  . Drug use: No  . Sexual activity: Not on file  Other Topics Concern  . Not on file  Social History Narrative   Lives at home with wife in a one story home.  Has no children.  Does not work.  Getting workman's comp.  Education: 4 years trade school.      Allergies  Allergen Reactions  . Prednisone Itching and Other (See Comments)    Abdominal pain  . Prozac [Fluoxetine Hcl] Itching  . Temazepam Other (See Comments)     Dizziness   . Trazodone And Nefazodone Other (See Comments)    Dizziness  . Ibuprofen Itching  . Wellbutrin [Bupropion] Hives     Outpatient Medications Prior to Visit  Medication Sig Dispense Refill  . aspirin 81 MG tablet Take 81 mg by mouth daily.    . carvedilol (COREG) 12.5 MG tablet Take 1 tablet (12.5 mg total) by mouth 2 (two) times daily with a meal. 180 tablet 2  . citalopram (CELEXA) 10 MG tablet Take 5 mg by mouth daily.     . furosemide (LASIX) 20 MG tablet Take 1 tablet (20 mg total) by mouth daily. 90 tablet 1  . hydroxyurea (HYDREA) 500 MG capsule Take 1 capsule (500 mg total) by mouth See admin instructions. Take one tablet every other day and two tablets the other days. 135 capsule 3  . mesalamine (APRISO) 0.375 g 24 hr capsule Take 375 mg by mouth as directed.    . Methotrexate Sodium (METHOTREXATE, PF,) 250 MG/10ML injection Inject 6 mg into the vein once a week.    [name] omeprazole (PRILOSEC) 20 MG capsule Take 20 mg by mouth 2 (two) times daily before a meal.     . Probiotic Product (ALIGN PO) Take by mouth as directed.    . sacubitril-valsartan (ENTRESTO) 24-26 MG Take 1 tablet by mouth 2 (two) times daily. 180 tablet 1  . silodosin (RAPAFLO) 8 MG CAPS capsule Take 8 mg by mouth daily.     [name] spironolactone (ALDACTONE) 25 MG tablet Take 0.5 tablets (12.5 mg total) by mouth daily. 45 tablet 3  . traMADol (ULTRAM) 50 MG tablet Take 1 tablet (50 mg total) by mouth every 6 (six) hours as needed. 30 tablet 0  . ULORIC 80 MG TABS Take 1 tablet by mouth daily.     Facility-Administered Medications Prior to Visit  Medication Dose Route Frequency Provider Last Rate Last Dose  . 0.9 %  sodium chloride infusion   Intravenous Continuous Volanda Napoleon, MD   Stopped at 05/16/13 1115        Objective:   Physical Exam Vitals:   06/14/17 1124  BP: 138/80  Pulse: 79  SpO2: 99%  Weight: (!) 309 lb (140.2 kg)  Height: 5' 10"  (1.778 m)   Gen: Pleasant, obese man, in no  distress,  normal affect  ENT: No lesions,  mouth clear,  oropharynx clear, no postnasal drip  Neck: No JVD, no stridor  Lungs: No use of accessory muscles, no wheezing, decreased at both bases  Cardiovascular: RRR, heart sounds normal, no murmur or gallops, no peripheral edema  Musculoskeletal: No deformities, no cyanosis or clubbing  Neuro: alert, non focal  Skin: Warm, no lesions or rash  Assessment & Plan:  Sleep apnea Currently untreated.  He had sinus disease that he associates with wearing the mask.  We may need to revisit this as we go forward.  Dyspnea on exertion Multifactorial due to combined restriction and also obstruction.  His pulmonary function testing clearly shows normalized lung volumes in the setting of airflow obstruction.  He understands that he needs to work on weight loss, physical conditioning.  In the meantime I think we should treat his obstruction, and he agrees.  We will start Stiolto to see if he benefits.  We will perform walking oximetry to ensure that he does not have occult desaturation.  He would likely benefit at some point in the future with treatment of his sleep apnea.  COPD (chronic obstructive pulmonary disease) ([place]) Reported by his current pulmonary function testing.  Trial of Stiolto as above.  Baltazar Apo, MD, PhD 06/14/2017, 12:03 PM [facility] [phone] or if no answer [phone]

## 2017-06-20 ENCOUNTER — Ambulatory Visit (INDEPENDENT_AMBULATORY_CARE_PROVIDER_SITE_OTHER): Payer: Medicare Other | Admitting: *Deleted

## 2017-06-20 DIAGNOSIS — I442 Atrioventricular block, complete: Secondary | ICD-10-CM

## 2017-06-20 DIAGNOSIS — I5022 Chronic systolic (congestive) heart failure: Secondary | ICD-10-CM

## 2017-06-20 NOTE — Progress Notes (Signed)
Remote pacemaker transmission.   

## 2017-06-21 ENCOUNTER — Encounter: Payer: Self-pay | Admitting: Cardiology

## 2017-06-21 DIAGNOSIS — M0579 Rheumatoid arthritis with rheumatoid factor of multiple sites without organ or systems involvement: Secondary | ICD-10-CM | POA: Diagnosis not present

## 2017-06-21 DIAGNOSIS — M545 Low back pain: Secondary | ICD-10-CM | POA: Diagnosis not present

## 2017-06-21 DIAGNOSIS — Z79899 Other long term (current) drug therapy: Secondary | ICD-10-CM | POA: Diagnosis not present

## 2017-06-21 DIAGNOSIS — M2550 Pain in unspecified joint: Secondary | ICD-10-CM | POA: Diagnosis not present

## 2017-06-21 DIAGNOSIS — M1A09X0 Idiopathic chronic gout, multiple sites, without tophus (tophi): Secondary | ICD-10-CM | POA: Diagnosis not present

## 2017-06-25 DIAGNOSIS — M47896 Other spondylosis, lumbar region: Secondary | ICD-10-CM | POA: Diagnosis not present

## 2017-06-25 DIAGNOSIS — M48062 Spinal stenosis, lumbar region with neurogenic claudication: Secondary | ICD-10-CM | POA: Diagnosis not present

## 2017-06-27 ENCOUNTER — Other Ambulatory Visit: Payer: Self-pay | Admitting: Orthopedic Surgery

## 2017-06-27 DIAGNOSIS — M47896 Other spondylosis, lumbar region: Secondary | ICD-10-CM

## 2017-07-03 ENCOUNTER — Other Ambulatory Visit (HOSPITAL_BASED_OUTPATIENT_CLINIC_OR_DEPARTMENT_OTHER): Payer: Self-pay | Admitting: Physician Assistant

## 2017-07-03 ENCOUNTER — Ambulatory Visit (HOSPITAL_BASED_OUTPATIENT_CLINIC_OR_DEPARTMENT_OTHER)
Admission: RE | Admit: 2017-07-03 | Discharge: 2017-07-03 | Disposition: A | Discharge status: Home / Self Care | Payer: Medicare Other | Source: Ambulatory Visit | Attending: Physician Assistant | Admitting: Physician Assistant

## 2017-07-03 DIAGNOSIS — Z950 Presence of cardiac pacemaker: Secondary | ICD-10-CM | POA: Insufficient documentation

## 2017-07-03 DIAGNOSIS — M47817 Spondylosis without myelopathy or radiculopathy, lumbosacral region: Secondary | ICD-10-CM | POA: Insufficient documentation

## 2017-07-03 DIAGNOSIS — M549 Dorsalgia, unspecified: Secondary | ICD-10-CM | POA: Diagnosis not present

## 2017-07-03 DIAGNOSIS — R161 Splenomegaly, not elsewhere classified: Secondary | ICD-10-CM | POA: Insufficient documentation

## 2017-07-03 DIAGNOSIS — R109 Unspecified abdominal pain: Secondary | ICD-10-CM | POA: Diagnosis not present

## 2017-07-03 DIAGNOSIS — K760 Fatty (change of) liver, not elsewhere classified: Secondary | ICD-10-CM | POA: Diagnosis not present

## 2017-07-03 DIAGNOSIS — I700 Atherosclerosis of aorta: Secondary | ICD-10-CM | POA: Insufficient documentation

## 2017-07-03 DIAGNOSIS — M47816 Spondylosis without myelopathy or radiculopathy, lumbar region: Secondary | ICD-10-CM | POA: Diagnosis not present

## 2017-07-03 DIAGNOSIS — Z87442 Personal history of urinary calculi: Secondary | ICD-10-CM | POA: Diagnosis not present

## 2017-07-03 DIAGNOSIS — N281 Cyst of kidney, acquired: Secondary | ICD-10-CM | POA: Diagnosis not present

## 2017-07-04 LAB — CUP PACEART REMOTE DEVICE CHECK
Battery Remaining Longevity: 87 mo
Battery Remaining Percentage: 95.5 %
Battery Voltage: 2.96 V
Brady Statistic AP VP Percent: 7.6 %
Brady Statistic AP VS Percent: 1 %
Brady Statistic AS VP Percent: 91 %
Brady Statistic AS VS Percent: 1 %
Brady Statistic RA Percent Paced: 6.1 %
Date Time Interrogation Session: 20190213091721
Implantable Lead Implant Date: 20151223
Implantable Lead Implant Date: 20151223
Implantable Lead Implant Date: 20151223
Implantable Lead Location: 753858
Implantable Lead Location: 753859
Implantable Lead Location: 753860
Implantable Pulse Generator Implant Date: 20151223
Lead Channel Impedance Value: 430 Ohm
Lead Channel Impedance Value: 490 Ohm
Lead Channel Impedance Value: 760 Ohm
Lead Channel Pacing Threshold Amplitude: 1.125 V
Lead Channel Pacing Threshold Amplitude: 1.125 V
Lead Channel Pacing Threshold Amplitude: 1.25 V
Lead Channel Pacing Threshold Pulse Width: 0.4 ms
Lead Channel Pacing Threshold Pulse Width: 0.4 ms
Lead Channel Pacing Threshold Pulse Width: 0.7 ms
Lead Channel Sensing Intrinsic Amplitude: 12 mV
Lead Channel Sensing Intrinsic Amplitude: 4.2 mV
Lead Channel Setting Pacing Amplitude: 2.125
Lead Channel Setting Pacing Amplitude: 2.125
Lead Channel Setting Pacing Amplitude: 2.25 V
Lead Channel Setting Pacing Pulse Width: 0.4 ms
Lead Channel Setting Pacing Pulse Width: 0.7 ms
Lead Channel Setting Sensing Sensitivity: 2 mV
Pulse Gen Model: 3242
Pulse Gen Serial Number: 7701275

## 2017-07-10 ENCOUNTER — Ambulatory Visit
Admission: RE | Admit: 2017-07-10 | Discharge: 2017-07-10 | Disposition: A | Discharge status: Home / Self Care | Payer: Medicare Other | Source: Ambulatory Visit | Attending: Orthopedic Surgery | Admitting: Orthopedic Surgery

## 2017-07-10 VITALS — BP 143/65 | HR 80

## 2017-07-10 DIAGNOSIS — M47896 Other spondylosis, lumbar region: Secondary | ICD-10-CM

## 2017-07-10 DIAGNOSIS — M47817 Spondylosis without myelopathy or radiculopathy, lumbosacral region: Secondary | ICD-10-CM

## 2017-07-10 DIAGNOSIS — M5126 Other intervertebral disc displacement, lumbar region: Secondary | ICD-10-CM | POA: Diagnosis not present

## 2017-07-10 MED ORDER — DIAZEPAM 5 MG PO TABS
10.0000 mg | ORAL_TABLET | Freq: Once | ORAL | Status: AC
  Administered 2017-07-10: 10 mg via ORAL

## 2017-07-10 MED ORDER — IOPAMIDOL (ISOVUE-M 200) INJECTION 41%
15.0000 mL | Freq: Once | INTRAMUSCULAR | Status: AC
  Administered 2017-07-10: 15 mL via INTRATHECAL

## 2017-07-10 NOTE — Progress Notes (Signed)
Patient states he has been off Celexa and Tramadol for at least the past two days.  Brita Romp, RN

## 2017-07-10 NOTE — Discharge Instructions (Signed)
Myelogram Discharge Instructions  1. Go home and rest quietly for the next 24 hours.  It is important to lie flat for the next 24 hours.  Get up only to go to the restroom.  You may lie in the bed or on a couch on your back, your stomach, your left side or your right side.  You may have one pillow under your head.  You may have pillows between your knees while you are on your side or under your knees while you are on your back.  2. DO NOT drive today.  Recline the seat as far back as it will go, while still wearing your seat belt, on the way home.  3. You may get up to go to the bathroom as needed.  You may sit up for 10 minutes to eat.  You may resume your normal diet and medications unless otherwise indicated.  Drink lots of extra fluids today and tomorrow.  4. The incidence of headache, nausea, or vomiting is about 5% (one in 20 patients).  If you develop a headache, lie flat and drink plenty of fluids until the headache goes away.  Caffeinated beverages may be helpful.  If you develop severe nausea and vomiting or a headache that does not go away with flat bed rest, call [phone].  5. You may resume normal activities after your 24 hours of bed rest is over; however, do not exert yourself strongly or do any heavy lifting tomorrow. If when you get up you have a headache when standing, go back to bed and force fluids for another 24 hours.  6. Call your physician for a follow-up appointment.  The results of your myelogram will be sent directly to your physician by the following day.  7. If you have any questions or if complications develop after you arrive home, please call [phone].  Discharge instructions have been explained to the patient.  The patient, or the person responsible for the patient, fully understands these instructions.  YOU MAY RESTART YOUR CELEXA AND TRAMADOL TOMORROW 07/11/2017 AT 9:30AM.

## 2017-07-12 ENCOUNTER — Encounter: Payer: Self-pay | Admitting: Emergency Medicine

## 2017-07-12 ENCOUNTER — Ambulatory Visit (INDEPENDENT_AMBULATORY_CARE_PROVIDER_SITE_OTHER): Payer: Medicare Other | Admitting: Emergency Medicine

## 2017-07-12 ENCOUNTER — Telehealth: Payer: Self-pay | Admitting: Emergency Medicine

## 2017-07-12 DIAGNOSIS — G4730 Sleep apnea, unspecified: Secondary | ICD-10-CM

## 2017-07-12 DIAGNOSIS — J449 Chronic obstructive pulmonary disease, unspecified: Secondary | ICD-10-CM | POA: Diagnosis not present

## 2017-07-12 MED ORDER — TIOTROPIUM BROMIDE-OLODATEROL 2.5-2.5 MCG/ACT IN AERS: 2.0000 | INHALATION_SPRAY | Freq: Every day | RESPIRATORY_TRACT | 11 refills | Status: DC

## 2017-07-12 NOTE — Patient Instructions (Addendum)
We will try to continue Stiolto 2 puffs once a day.  We will order this to your pharmacy, assess the expense.  If possible we will use financial assistance coupon to help defray the cost. If Stiolto is not affordable we will try to find an alternative. We will revisit possibly restarting CPAP at some point in the future if your nasal obstruction, sinus symptoms improved. Follow with Dr Lamonte Sakai in 6 months or sooner if you have any problems

## 2017-07-12 NOTE — Progress Notes (Signed)
Subjective:    Patient ID: William Villanueva, male    DOB: 1951/03/12, 67 y.o.   MRN: [id number]  COPD  He complains of shortness of breath. There is no cough or wheezing. Associated symptoms include sneezing. Pertinent negatives include no ear pain, fever, headaches, postnasal drip, rhinorrhea, sore throat or trouble swallowing. His past medical history is significant for COPD.   67 year old former smoker (45 pack years) with a history of second-degree AV block (paced) nonischemic cardiomyopathy, polycythemia vera followed by Dr Katheran Awe.  Also with RA followed by Rheumatology, on MTX for last 3 months. He is referred today for evaluation of dyspnea.  He underwent pulmonary function testing on 06/04/17 that I have reviewed.  His spirometry shows severe obstruction, lung volumes are normal which suggest possible coexisting restriction, fusion capacity decreased and does not correct for alveolar volume.  He had a sleep study, was formerly on CPAP. He stopped it after he kept getting sinus infections, used for 1 yr,.   He describes exertional SOB progressive over over a year. He has gained 50 lbs over that time. His exercise is also limited by back pain from a work injury. He hears wheeze at rest or w exertion. He has cough, has been worse lately since a URI, mostly at night. He was treated w abx in 12/18, now started on another course yesterday. Occasional reflux.    ROV 07/12/17 --this is a follow-up visit for multifactorial dyspnea in the setting of history of tobacco use, nonischemic cardiomyopathy, untreated sleep apnea, deconditioning.  He has documented severe obstructive lung disease noted on spirometry.  At his last visit we did a walking oximetry and he did not desaturate.  We started [employer] and he reports that he has tolerated. He believes that the Stiolto has helped him, although he hurt his back and it has limited his ability to test the stiolto fully.  Review of Systems  Constitutional: Negative for fever and unexpected weight change.  HENT: Positive for congestion and sneezing. Negative for dental problem, ear pain, nosebleeds, postnasal drip, rhinorrhea, sinus pressure, sore throat and trouble swallowing.   Eyes: Negative for redness and itching.  Respiratory: Positive for shortness of breath. Negative for cough, chest tightness and wheezing.   Cardiovascular: Negative for palpitations and leg swelling.  Gastrointestinal: Negative for nausea and vomiting.  Genitourinary: Negative for dysuria.  Musculoskeletal: Negative for joint swelling.  Skin: Negative for rash.  Neurological: Negative for headaches.  Hematological: Does not bruise/bleed easily.  Psychiatric/Behavioral: Negative for dysphoric mood. The patient is not nervous/anxious.    Past Medical History:  Diagnosis Date  . AV block, 2nd degree 2015   [employer] Allure Quadra pulse generator [id number], model PM 3242  . Back pain   . Cardiomyopathy ([place])    Nonischemic 45%.   . CHF (congestive heart failure) ([place])   . Gout   . Hemochromatosis   . Hypertension   . Hypospadias 1951-02-16   born with  .  Nephrolithiasis   . 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 Heart disease Maternal Grandmother        Pacemaker, MI  . Stroke Mother   . Other Father        Deceased, car fell on him  . Diabetes Sister   . Hypertension Sister      Social History   Socioeconomic History  . Marital status: Married    Spouse name: Not on file  . Number of children: Not on file  . Years of education: Not on file  . Highest education level: Not on file  Social Needs  . Financial resource strain: Not on file  . Food insecurity - worry: Not on file  . Food insecurity - inability: Not on file  . Transportation needs - medical: Not on file  . Transportation needs - non-medical: Not on file  Occupational History  . Not on file  Tobacco Use  . Smoking status: Former Smoker    Packs/day: 1.00    Years: 44.00    Pack years: 44.00    Types: Cigarettes    Start date: 06/05/1968    Last attempt to quit: 04/05/2013    Years since quitting: 4.2  . Smokeless tobacco: Never Used  . Tobacco comment: quit 2 years ago  Substance and Sexual Activity  . Alcohol use: Yes    Alcohol/week: 0.0 oz    Comment: Rare - once per month  . Drug use: No  . Sexual activity: Not on file  Other Topics Concern  . Not on file  Social History Narrative   Lives at home with wife in a one story home.  Has no children.  Does not work.  Getting workman's comp.  Education: 4 years trade school.      Allergies  Allergen Reactions  . Prednisone Itching and Other (See Comments)    Abdominal pain  . Temazepam Other (See Comments)    Dizziness   . Trazodone And Nefazodone Other (See Comments)    Dizziness  . Ibuprofen Itching  . Prozac [Fluoxetine Hcl] Itching  . Wellbutrin [Bupropion] Hives     Outpatient Medications Prior to Visit  Medication Sig Dispense Refill  . aspirin 81 MG tablet Take 81 mg by mouth daily.    . carvedilol (COREG) 12.5 MG tablet Take 1 tablet (12.5 mg  total)  by mouth 2 (two) times daily with a meal. 180 tablet 2  . citalopram (CELEXA) 10 MG tablet Take 5 mg by mouth daily.     . furosemide (LASIX) 20 MG tablet Take 1 tablet (20 mg total) by mouth daily. 90 tablet 1  . hydroxyurea (HYDREA) 500 MG capsule Take 1 capsule (500 mg total) by mouth See admin instructions. Take one tablet every other day and two tablets the other days. 135 capsule 3  . mesalamine (APRISO) 0.375 g 24 hr capsule Take 375 mg by mouth as directed.    . methocarbamol (ROBAXIN) 500 MG tablet Take 500 mg by mouth 4 (four) times daily.    . Methotrexate Sodium (METHOTREXATE, PF,) 250 MG/10ML injection Inject 6 mg into the vein once a week.    [name] omeprazole (PRILOSEC) 20 MG capsule Take 20 mg by mouth 2 (two) times daily before a meal.     . Probiotic Product (ALIGN PO) Take by mouth as directed.    . sacubitril-valsartan (ENTRESTO) 24-26 MG Take 1 tablet by mouth 2 (two) times daily. 180 tablet 1  . silodosin (RAPAFLO) 8 MG CAPS capsule Take 8 mg by mouth daily.     [name] spironolactone (ALDACTONE) 25 MG tablet Take 0.5 tablets (12.5 mg total) by mouth daily. 45 tablet 3  . Tiotropium Bromide-Olodaterol (STIOLTO RESPIMAT) 2.5-2.5 MCG/ACT AERS Inhale 2 puffs into the lungs daily. 2 Inhaler 0  . traMADol (ULTRAM) 50 MG tablet Take 1 tablet (50 mg total) by mouth every 6 (six) hours as needed. 30 tablet 0  . ULORIC 80 MG TABS Take 1 tablet by mouth daily.     Facility-Administered Medications Prior to Visit  Medication Dose Route Frequency Provider Last Rate Last Dose  . 0.9 %  sodium chloride infusion   Intravenous Continuous Volanda Napoleon, MD   Stopped at 05/16/13 1115        Objective:   Physical Exam Vitals:   07/12/17 1138  BP: 110/72  Pulse: 85  SpO2: 97%  Weight: (!) 313 lb (142 kg)  Height: 5' 10"  (1.778 m)   Gen: Pleasant, obese man, in no distress,  normal affect  ENT: No lesions,  mouth clear,  oropharynx clear, no postnasal drip  Neck: No JVD, no  stridor  Lungs: No use of accessory muscles, no wheezing, decreased at both bases  Cardiovascular: RRR, heart sounds normal, no murmur or gallops, no peripheral edema  Musculoskeletal: No deformities, no cyanosis or clubbing  Neuro: alert, non focal  Skin: Warm, no lesions or rash      Assessment & Plan:  COPD (chronic obstructive pulmonary disease) (HCC) We will try to continue Stiolto 2 puffs once a day.  We will order this to your pharmacy, assess the expense.  If possible we will use financial assistance coupon to help defray the cost. If Stiolto is not affordable we will try to find an alternative.. Follow with Dr Lamonte Sakai in 6 months or sooner if you have any problems  Sleep apnea Currently untreated.  The largest barrier is that he has nasal obstruction at night when he sleeps.  We will continue to work to restart if we can make his breathing better, nasal symptoms better.  Baltazar Apo, MD, PhD 07/12/2017, 12:01 PM [facility] [phone] or if no answer [phone]

## 2017-07-12 NOTE — Assessment & Plan Note (Signed)
We will try to continue Stiolto 2 puffs once a day.  We will order this to your pharmacy, assess the expense.  If possible we will use financial assistance coupon to help defray the cost. If Stiolto is not affordable we will try to find an alternative.. Follow with Dr Lamonte Sakai in 6 months or sooner if you have any problems

## 2017-07-12 NOTE — Assessment & Plan Note (Signed)
Currently untreated.  The largest barrier is that he has nasal obstruction at night when he sleeps.  We will continue to work to restart if we can make his breathing better, nasal symptoms better.

## 2017-07-12 NOTE — Telephone Encounter (Signed)
Spoke with patients pharmacy, PA is needed for [employer]. PA initiated via CMM. Key for this is [id number].  Patient is aware this can take 24-72 hours before we get determination.

## 2017-07-12 NOTE — Addendum Note (Signed)
Addended by: Desmond Dike C on: 07/12/2017 12:04 PM   Modules accepted: Orders

## 2017-07-13 ENCOUNTER — Other Ambulatory Visit: Payer: Self-pay | Admitting: Cardiology

## 2017-07-13 DIAGNOSIS — R0602 Shortness of breath: Secondary | ICD-10-CM

## 2017-07-16 ENCOUNTER — Telehealth: Payer: Self-pay | Admitting: Emergency Medicine

## 2017-07-16 DIAGNOSIS — M47896 Other spondylosis, lumbar region: Secondary | ICD-10-CM | POA: Diagnosis not present

## 2017-07-16 DIAGNOSIS — M48062 Spinal stenosis, lumbar region with neurogenic claudication: Secondary | ICD-10-CM | POA: Diagnosis not present

## 2017-07-16 DIAGNOSIS — S32059A Unspecified fracture of fifth lumbar vertebra, initial encounter for closed fracture: Secondary | ICD-10-CM | POA: Diagnosis not present

## 2017-07-16 NOTE — Telephone Encounter (Signed)
Spoke to [place] to get some clarification on this message. Per Lyanne Co from [facility] wanted [place] to call patient to get the list of alternatives for the [facility].   Called patient unable to reach left message to give [place] a call back.

## 2017-07-17 ENCOUNTER — Inpatient Hospital Stay: Payer: Medicare Other

## 2017-07-17 ENCOUNTER — Other Ambulatory Visit: Payer: Self-pay

## 2017-07-17 ENCOUNTER — Inpatient Hospital Stay: Payer: Medicare Other | Attending: Hematology & Oncology | Admitting: Hematology & Oncology

## 2017-07-17 VITALS — BP 122/44 | HR 82 | Temp 97.7°F | Resp 20 | Wt 313.0 lb

## 2017-07-17 DIAGNOSIS — I441 Atrioventricular block, second degree: Secondary | ICD-10-CM | POA: Diagnosis not present

## 2017-07-17 DIAGNOSIS — Z7982 Long term (current) use of aspirin: Secondary | ICD-10-CM | POA: Insufficient documentation

## 2017-07-17 DIAGNOSIS — D45 Polycythemia vera: Secondary | ICD-10-CM | POA: Diagnosis not present

## 2017-07-17 DIAGNOSIS — I509 Heart failure, unspecified: Secondary | ICD-10-CM | POA: Insufficient documentation

## 2017-07-17 LAB — CMP (CANCER CENTER ONLY)
ALT: 31 U/L (ref 0–55)
AST: 28 U/L (ref 5–34)
Albumin: 3.7 g/dL (ref 3.5–5.0)
Alkaline Phosphatase: 84 U/L (ref 40–150)
Anion gap: 12 — ABNORMAL HIGH (ref 3–11)
BUN: 16 mg/dL (ref 7–26)
CO2: 18 mmol/L — ABNORMAL LOW (ref 22–29)
Calcium: 8.5 mg/dL (ref 8.4–10.4)
Chloride: 106 mmol/L (ref 98–109)
Creatinine: 1.16 mg/dL (ref 0.70–1.30)
GFR, Est AFR Am: >60 mL/min (ref >60)
GFR, Estimated: >60 mL/min (ref >60)
Glucose, Bld: 266 mg/dL — ABNORMAL HIGH (ref 70–140)
Potassium: 4.6 mmol/L (ref 3.5–5.1)
Sodium: 136 mmol/L (ref 136–145)
Total Bilirubin: 0.5 mg/dL (ref 0.2–1.2)
Total Protein: 7.1 g/dL (ref 6.4–8.3)

## 2017-07-17 LAB — CBC WITH DIFFERENTIAL (CANCER CENTER ONLY)
Basophils Absolute: 0.3 10*3/uL — ABNORMAL HIGH (ref 0.0–0.1)
Basophils Relative: 3 %
Eosinophils Absolute: 0.3 10*3/uL (ref 0.0–0.5)
Eosinophils Relative: 3 %
HCT: 43.6 % (ref 38.7–49.9)
Hemoglobin: 13.8 g/dL (ref 13.0–17.1)
Lymphocytes Relative: 12 %
Lymphs Abs: 1.1 10*3/uL (ref 0.9–3.3)
MCH: 31.4 pg (ref 28.0–33.4)
MCHC: 31.7 g/dL — ABNORMAL LOW (ref 32.0–35.9)
MCV: 99.3 fL — ABNORMAL HIGH (ref 82.0–98.0)
Monocytes Absolute: 0.7 10*3/uL (ref 0.1–0.9)
Monocytes Relative: 8 %
Neutro Abs: 6.7 10*3/uL — ABNORMAL HIGH (ref 1.5–6.5)
Neutrophils Relative %: 74 %
Platelet Count: 273 10*3/uL (ref 145–400)
RBC: 4.39 MIL/uL (ref 4.20–5.70)
RDW: 18 % — ABNORMAL HIGH (ref 11.1–15.7)
WBC Count: 9 10*3/uL (ref 4.0–10.0)

## 2017-07-17 LAB — IRON AND TIBC
Iron: 36 ug/dL — ABNORMAL LOW (ref 42–163)
Saturation Ratios: 9 % — ABNORMAL LOW (ref 42–163)
TIBC: 394 ug/dL (ref 202–409)
UIBC: 357 ug/dL

## 2017-07-17 LAB — LACTATE DEHYDROGENASE: LDH: 238 U/L (ref 125–245)

## 2017-07-17 LAB — FERRITIN: Ferritin: 19 ng/mL — ABNORMAL LOW (ref 22–316)

## 2017-07-17 NOTE — Progress Notes (Signed)
Hematology and Oncology Follow Up Visit  William Villanueva [id number] Jun 26, 1950 67 y.o. 07/17/2017   Principle Diagnosis:   Polycythemia vera-JAK2 positive  Heart block-Mobitz II  Current Therapy:    Hydrea 542m po daily alternating with 1000 mg p.o.daily -dose changed on 04/10/2017  Aspirin 81 mg by mouth daily  Phlebotomy to maintain hematocrit below 45%     Interim History:  [name] back for followup.  His problem now is that he has a fracture in his lower back.  He apparently fell back in January.  He had a CT scan done.  This actually was done last week.  This showed that there is a fracture at L5.  This is quite painful for him.  He is on medication right now.  He did see a spine doctor.  I am not sure why it was not recommended that he try kyphoplasty.  I will see about doing a kyphoplasty for him.  I really think this might be worthwhile trying.  Otherwise, he seems to be doing okay.  He still has some occasional chest wall discomfort.  I think he has some costochondritis.  He has had no headache.  He has had no change in bowel or bladder habits.  Overall, his performance status is ECOG 1.    Medications:  Current Outpatient Medications:  .  aspirin 81 MG tablet, Take 81 mg by mouth daily., Disp: , Rfl:  .  calcitonin, salmon, (MIACALCIN/FORTICAL) 200 UNIT/ACT nasal spray, 200 sprays., Disp: , Rfl:  .  carvedilol (COREG) 12.5 MG tablet, Take 1 tablet (12.5 mg total) by mouth 2 (two) times daily with a meal., Disp: 180 tablet, Rfl: 2 .  citalopram (CELEXA) 10 MG tablet, Take 5 mg by mouth daily. , Disp: , Rfl:  .  furosemide (LASIX) 20 MG tablet, TAKE ONE TABLET BY MOUTH DAILY, Disp: 60 tablet, Rfl: 10 .  gabapentin (NEURONTIN) 300 MG capsule, Take 300 mg by mouth 2 (two) times daily., Disp: , Rfl:  .  HYDROcodone-acetaminophen (NORCO/VICODIN) 5-325 MG tablet, Take 5-325 tablets by mouth every 12 (twelve) hours as needed., Disp: , Rfl:  .  hydroxyurea (HYDREA)  500 MG capsule, Take 1 capsule (500 mg total) by mouth See admin instructions. Take one tablet every other day and two tablets the other days., Disp: 135 capsule, Rfl: 3 .  mesalamine (APRISO) 0.375 g 24 hr capsule, Take 375 mg by mouth as directed., Disp: , Rfl:  .  methocarbamol (ROBAXIN) 500 MG tablet, Take 500 mg by mouth 4 (four) times daily., Disp: , Rfl:  .  Methotrexate Sodium (METHOTREXATE, PF,) 250 MG/10ML injection, Inject 6 mg into the vein once a week., Disp: , Rfl:  .  omeprazole (PRILOSEC) 20 MG capsule, Take 20 mg by mouth 2 (two) times daily before a meal. , Disp: , Rfl:  .  Probiotic Product (ALIGN PO), Take by mouth as directed., Disp: , Rfl:  .  sacubitril-valsartan (ENTRESTO) 24-26 MG, Take 1 tablet by mouth 2 (two) times daily., Disp: 180 tablet, Rfl: 1 .  silodosin (RAPAFLO) 8 MG CAPS capsule, Take 8 mg by mouth daily. , Disp: , Rfl:  .  spironolactone (ALDACTONE) 25 MG tablet, Take 0.5 tablets (12.5 mg total) by mouth daily., Disp: 45 tablet, Rfl: 3 .  Tiotropium Bromide-Olodaterol (STIOLTO RESPIMAT) 2.5-2.5 MCG/ACT AERS, Inhale 2 puffs into the lungs daily., Disp: 1 Inhaler, Rfl: 11 .  traMADol (ULTRAM) 50 MG tablet, Take 1 tablet (50 mg total) by mouth  every 6 (six) hours as needed., Disp: 30 tablet, Rfl: 0 .  ULORIC 80 MG TABS, Take 1 tablet by mouth daily., Disp: , Rfl:  No current facility-administered medications for this visit.   Facility-Administered Medications Ordered in Other Visits:  .  0.9 %  sodium chloride infusion, , Intravenous, Continuous, Jakala Herford, Rudell Cobb, MD, Stopped at 05/16/13 1115  Allergies:  Allergies  Allergen Reactions  . Prednisone Itching and Other (See Comments)    Abdominal pain  . Temazepam Other (See Comments)    Dizziness   . Trazodone And Nefazodone Other (See Comments)    Dizziness  . Ibuprofen Itching  . Prozac [Fluoxetine Hcl] Itching  . Wellbutrin [Bupropion] Hives    Past Medical History, Surgical history, Social history,  and Family History were reviewed and updated.  Review of Systems: Review of Systems  Constitutional: Negative.   HENT: Negative.   Eyes: Negative.   Respiratory: Positive for shortness of breath.   Cardiovascular: Positive for chest pain.  Gastrointestinal: Negative.   Genitourinary: Negative.   Musculoskeletal: Positive for back pain.  Skin: Negative.   Neurological: Negative.   Endo/Heme/Allergies: Negative.   Psychiatric/Behavioral: Negative.     Physical Exam:  weight is 313 lb (142 kg) (abnormal). His oral temperature is 97.7 F (36.5 C). His blood pressure is 122/44 (abnormal) and his pulse is 82. His respiration is 20 and oxygen saturation is 96%.   Physical Exam  Constitutional: He is oriented to person, place, and time.  HENT:  Head: Normocephalic and atraumatic.  Mouth/Throat: Oropharynx is clear and moist.  Eyes: EOM are normal. Pupils are equal, round, and reactive to light.  Neck: Normal range of motion.  Cardiovascular: Normal rate, regular rhythm and normal heart sounds.  Pulmonary/Chest: Effort normal and breath sounds normal.  Abdominal: Soft. Bowel sounds are normal.  Musculoskeletal: Normal range of motion. He exhibits no edema, tenderness or deformity.  Lymphadenopathy:    He has no cervical adenopathy.  Neurological: He is alert and oriented to person, place, and time.  Skin: Skin is warm and dry. No rash noted. No erythema.  Psychiatric: He has a normal mood and affect. His behavior is normal. Judgment and thought content normal.  Vitals reviewed.    Lab Results  Component Value Date   WBC 9.0 07/17/2017   HGB 13.5 04/10/2017   HCT 43.6 07/17/2017   MCV 99.3 (H) 07/17/2017   PLT 273 07/17/2017     Chemistry      Component Value Date/Time   NA 142 05/29/2017 0915   NA 141 04/10/2017 0923   NA 140 04/05/2016 0739   K 3.5 05/29/2017 0915   K 3.5 04/10/2017 0923   K 4.2 04/05/2016 0739   CL 109 (H) 05/29/2017 0915   CL 108 04/10/2017 0923    CO2 24 05/29/2017 0915   CO2 24 04/10/2017 0923   CO2 20 (L) 04/05/2016 0739   BUN 11 05/29/2017 0915   BUN 12 04/10/2017 0923   BUN 14.7 04/05/2016 0739   CREATININE 1.10 05/29/2017 0915   CREATININE 1.1 04/10/2017 0923   CREATININE 1.1 04/05/2016 0739      Component Value Date/Time   CALCIUM 8.9 05/29/2017 0915   CALCIUM 8.4 04/10/2017 0923   CALCIUM 9.0 04/05/2016 0739   ALKPHOS 81 05/29/2017 0915   ALKPHOS 77 04/10/2017 0923   ALKPHOS 91 04/05/2016 0739   AST 33 05/29/2017 0915   AST 34 04/05/2016 0739   ALT 32 05/29/2017 0915   ALT  32 04/10/2017 0923   ALT 40 04/05/2016 0739   BILITOT 0.7 05/29/2017 0915   BILITOT 0.48 04/05/2016 0739      Impression and Plan: William Villanueva is 67 year old gentleman with polycythemia vera.   His hematocrit is doing quite well.  As such, I just do not think that we have to put him through a phlebotomy right now.  I will see if interventional radiology can see him and see about a kyphoplasty at L5.  I think this will help him.  R  I will plan to see him back in 2 more months.    Volanda Napoleon, MD 3/12/201911:53 AM

## 2017-07-18 NOTE — Telephone Encounter (Signed)
Spoke with patient and his wife Mardene Celeste. They stated that [facility] would cover Anoro, Spiriva Handihaler or Respimat, Tudorza, Incruse or Striverdi Respimat.   RB, please advise on what medication you would like to switch the patient to. Thanks!

## 2017-07-18 NOTE — Telephone Encounter (Signed)
lmtcb x2 for pt. 

## 2017-07-18 NOTE — Telephone Encounter (Signed)
Patient returned call, CB is [phone].

## 2017-07-19 MED ORDER — UMECLIDINIUM-VILANTEROL 62.5-25 MCG/INH IN AEPB: 1.0000 | INHALATION_SPRAY | Freq: Every day | RESPIRATORY_TRACT | 6 refills | Status: AC

## 2017-07-19 NOTE — Telephone Encounter (Signed)
I would try the Anoro

## 2017-07-19 NOTE — Telephone Encounter (Signed)
Pt is calling back [phone]

## 2017-07-19 NOTE — Telephone Encounter (Signed)
Spoke with pt. Advised him that he we are still waiting for Dr. Agustina Caroli response.  Dr. Lamonte Sakai - please advise. Thanks.

## 2017-07-19 NOTE — Telephone Encounter (Signed)
Pt is aware of below message and voiced his understanding. Rx for Anoro has been sent to preferred pharmacy. Nothing further is needed.

## 2017-07-24 ENCOUNTER — Other Ambulatory Visit: Payer: Self-pay | Admitting: Internal Medicine

## 2017-07-26 ENCOUNTER — Other Ambulatory Visit: Payer: Self-pay | Admitting: Cardiology

## 2017-07-27 NOTE — Telephone Encounter (Signed)
Rx has been sent to the pharmacy electronically. ° °

## 2017-08-01 ENCOUNTER — Other Ambulatory Visit: Payer: Self-pay | Admitting: Hematology & Oncology

## 2017-08-01 DIAGNOSIS — D45 Polycythemia vera: Secondary | ICD-10-CM

## 2017-08-02 ENCOUNTER — Ambulatory Visit (INDEPENDENT_AMBULATORY_CARE_PROVIDER_SITE_OTHER): Payer: Medicare Other | Admitting: Adult Health

## 2017-08-02 ENCOUNTER — Telehealth: Payer: Self-pay | Admitting: Emergency Medicine

## 2017-08-02 ENCOUNTER — Encounter: Payer: Self-pay | Admitting: Adult Health

## 2017-08-02 VITALS — BP 126/76 | HR 75 | Ht 70.0 in | Wt 312.0 lb

## 2017-08-02 DIAGNOSIS — J449 Chronic obstructive pulmonary disease, unspecified: Secondary | ICD-10-CM | POA: Diagnosis not present

## 2017-08-02 MED ORDER — LEVALBUTEROL HCL 0.63 MG/3ML IN NEBU
0.6300 mg | INHALATION_SOLUTION | Freq: Once | RESPIRATORY_TRACT | Status: AC
  Administered 2017-08-02: 0.63 mg via RESPIRATORY_TRACT

## 2017-08-02 NOTE — Patient Instructions (Addendum)
Stop ANORO  Begin Spiriva 1 puff daily, rinse after use Follow up with Dr. Lamonte Sakai  In 2-3 months and As needed   Please contact office for sooner follow up if symptoms do not improve or worsen or seek emergency care

## 2017-08-02 NOTE — Assessment & Plan Note (Addendum)
Moderate COPD - unable to tolerate or afford ANORO or Stiolto  xopenex neb x 1 in office . Would prefer inhaler if possible instead of nebs if able to afford.  Trial of Spiriva which is on his formulary    Plan  Patient Instructions  Stop ANORO  Begin Spiriva 1 puff daily, rinse after use Follow up with Dr. Lamonte Sakai  In 2-3 months and As needed   Please contact office for sooner follow up if symptoms do not improve or worsen or seek emergency care

## 2017-08-02 NOTE — Progress Notes (Signed)
@Patient  ID: William Villanueva, male    DOB: Dec 05, 1950, 67 y.o.   MRN: [id number]  Chief Complaint  Patient presents with  . Otitis Media    COPD    Referring provider: [name], MD  HPI: 67 year old male former  smoker followed for COPD Past medical history significant for congestive heart failure/cardiomyopathy, RA on Methotrexate    TEST  PFT January 2019 showed moderate COPD with an FEV1 at 58%, ratio 65, FVC 67%, no significant bronchodilator response positive mid flow reversibility and a decreased DLCO at 48%  08/02/2017 Acute OV : COPD  Patient presents for an acute office visit.  He complains of hoarseness and reaction to [facility].  Denies sore throat or dysphagia.  Previously on Stiolto but was not able to afford. Can not afford Anoro either . No increased cough or congestion . Gets winded with activity .  No wheezing .   Called pharmacy and [name] is covered in his formulary .      Allergies  Allergen Reactions  . Prednisone Itching and Other (See Comments)    Abdominal pain  . Temazepam Other (See Comments)    Dizziness   . Trazodone And Nefazodone Other (See Comments)    Dizziness  . Ibuprofen Itching  . Prozac [Fluoxetine Hcl] Itching  . Wellbutrin [Bupropion] Hives    Immunization History  Administered Date(s) Administered  . DTaP 01/26/2016  . Influenza,inj,Quad PF,6+ Mos 01/21/2015, 02/05/2017  . Influenza-Unspecified 01/26/2016  . Pneumococcal Polysaccharide-23 01/21/2015  . Pneumococcal-Unspecified 01/26/2016  . Tdap 01/26/2011    Past Medical History:  Diagnosis Date  . AV block, 2nd degree 2015   St. Jude Allure Quadra pulse generator [id number], model PM 3242  . Back pain   . Cardiomyopathy ([place])    Nonischemic 45%.   . CHF (congestive heart failure) ([place])   . Gout   . Hemochromatosis   . Hypertension   . Hypospadias 07-18-1950   born with  . Nephrolithiasis   . Polycythemia vera(238.4)     Tobacco History: Social History    Tobacco Use  Smoking Status Former Smoker  . Packs/day: 1.00  . Years: 44.00  . Pack years: 44.00  . Types: Cigarettes  . Start date: 06/05/1968  . Last attempt to quit: 04/05/2013  . Years since quitting: 4.3  Smokeless Tobacco Never Used  Tobacco Comment   quit 2 years ago   Counseling given: Not Answered Comment: quit 2 years ago   Outpatient Encounter Medications as of 08/02/2017  Medication Sig  . aspirin 81 MG tablet Take 81 mg by mouth daily.  . calcitonin, salmon, (MIACALCIN/FORTICAL) 200 UNIT/ACT nasal spray 200 sprays.  . carvedilol (COREG) 12.5 MG tablet Take 1 tablet (12.5 mg total) by mouth 2 (two) times daily with a meal.  . citalopram (CELEXA) 10 MG tablet Take 5 mg by mouth daily.   [name] ENTRESTO 24-26 MG TAKE ONE TABLET BY MOUTH TWICE A DAY  . furosemide (LASIX) 20 MG tablet TAKE ONE TABLET BY MOUTH DAILY  . gabapentin (NEURONTIN) 300 MG capsule Take 300 mg by mouth 2 (two) times daily.  [name] HYDROcodone-acetaminophen (NORCO/VICODIN) 5-325 MG tablet Take 5-325 tablets by mouth every 12 (twelve) hours as needed.  . hydroxyurea (HYDREA) 500 MG capsule TAKE ONE TABLET EVERY OTHER DAY AND 2 TABLETS THEN OTHER DAYS  . mesalamine (APRISO) 0.375 g 24 hr capsule Take 375 mg by mouth as directed.  . methocarbamol (ROBAXIN) 500 MG tablet Take 500 mg by mouth 4 (four)  times daily.  . Methotrexate Sodium (METHOTREXATE, PF,) 250 MG/10ML injection Inject 6 mg into the vein once a week.  [name] omeprazole (PRILOSEC) 20 MG capsule Take 20 mg by mouth 2 (two) times daily before a meal.   . Probiotic Product (ALIGN PO) Take by mouth as directed.  . silodosin (RAPAFLO) 8 MG CAPS capsule Take 8 mg by mouth daily.   [name] spironolactone (ALDACTONE) 25 MG tablet Take 0.5 tablets (12.5 mg total) by mouth daily.  [name] ULORIC 80 MG TABS Take 1 tablet by mouth daily.  . [DISCONTINUED] traMADol (ULTRAM) 50 MG tablet Take 1 tablet (50 mg total) by mouth every 6 (six) hours as needed. (Patient not taking:  Reported on 08/02/2017)   Facility-Administered Encounter Medications as of 08/02/2017  Medication  . 0.9 %  sodium chloride infusion  . [COMPLETED] levalbuterol (XOPENEX) nebulizer solution 0.63 mg     Review of Systems  Constitutional:   No  weight loss, night sweats,  Fevers, chills, fatigue, or  lassitude.  HEENT:   No headaches,  Difficulty swallowing,  Tooth/dental problems, or  Sore throat,                No sneezing, itching, ear ache, nasal congestion, post nasal drip,   CV:  No chest pain,  Orthopnea, PND, swelling in lower extremities, anasarca, dizziness, palpitations, syncope.   GI  No heartburn, indigestion, abdominal pain, nausea, vomiting, diarrhea, change in bowel habits, loss of appetite, bloody stools.   Resp: .  No chest wall deformity  Skin: no rash or lesions.  GU: no dysuria, change in color of urine, no urgency or frequency.  No flank pain, no hematuria   MS:  No joint pain or swelling.  No decreased range of motion.  No back pain.    Physical Exam  BP 126/76 (BP Location: Left Arm, Cuff Size: Normal)   Pulse 75   Ht 5\' 10"  (1.778 m)   Wt (!) 312 lb (141.5 kg)   SpO2 96%   BMI 44.77 kg/m   GEN: A/Ox3; pleasant , NAD, obese    HEENT:  [place]/AT,  EACs-clear, TMs-wnl, NOSE-clear, THROAT-clear, no lesions, no postnasal drip or exudate noted. No thrush noted   NECK:  Supple w/ fair ROM; no JVD; normal carotid impulses w/o bruits; no thyromegaly or nodules palpated; no lymphadenopathy.    RESP  Clear  P & A; w/o, wheezes/ rales/ or rhonchi. no accessory muscle use, no dullness to percussion  CARD:  RRR, no m/r/g, tr peripheral edema, pulses intact, no cyanosis or clubbing.  GI:   Soft & nt; nml bowel sounds; no organomegaly or masses detected.   Musco: Warm bil, no deformities or joint swelling noted.   Neuro: alert, no focal deficits noted.    Skin: Warm, no lesions or rashes    Lab Results:  CBC  BMET  BNP  ProBNP No results found  for: PROBNP  Imaging:    Assessment & Plan:   COPD (chronic obstructive pulmonary disease) (HCC) Moderate COPD - unable to tolerate or afford ANORO or Stiolto  xopenex neb x 1 in office . Would prefer inhaler if possible instead of nebs if able to afford.  Trial of Spiriva which is on his formulary    Plan  Patient Instructions  Stop ANORO  Begin Spiriva 1 puff daily, rinse after use Follow up with Dr. Lamonte Sakai  In 2-3 months and As needed   Please contact office for sooner follow up if symptoms  do not improve or worsen or seek emergency care           Rexene Edison, NP 08/02/2017

## 2017-08-02 NOTE — Telephone Encounter (Signed)
Called and spoke to patient's wife who reported that patient has been coughing up copious amounts of brown colored phlegm and has developed hoarseness.  Patient's wife also stated they can't afford the Anoro without a coupon, costing over $300. Wife requesting coupons and/or samples. Scheduled patient for an acute visit with Rexene Edison, NP for this afternoon at 3:15.  Nothing further is needed at this time.

## 2017-08-06 ENCOUNTER — Telehealth: Payer: Self-pay | Admitting: Emergency Medicine

## 2017-08-06 DIAGNOSIS — R490 Dysphonia: Secondary | ICD-10-CM

## 2017-08-06 NOTE — Telephone Encounter (Signed)
Spoke with pt's wife. States that the pt is still having issues with hoarseness. On 08/02/17, TP changed Anoro to Spiriva Handihaler. While on the phone, the pt's wife put the pt on the phone and he sounds like "he has a frog in his throat." They are both concerned that the pt's voice has not come back. He denies any sore throat or pain when swallowing.  RB - please advise. Thanks.

## 2017-08-06 NOTE — Telephone Encounter (Signed)
Spoke with patient's wife. She is aware of the referral. Patient would like to be referred to Dr. Janace Hoard at [facility]. Will go ahead and place the referral.   Nothing else needed at time of call.

## 2017-08-06 NOTE — Telephone Encounter (Signed)
Per RB >> please refer to ENT for hoarseness.

## 2017-08-14 DIAGNOSIS — R490 Dysphonia: Secondary | ICD-10-CM | POA: Diagnosis not present

## 2017-08-17 DIAGNOSIS — R490 Dysphonia: Secondary | ICD-10-CM | POA: Diagnosis not present

## 2017-08-17 DIAGNOSIS — J329 Chronic sinusitis, unspecified: Secondary | ICD-10-CM | POA: Diagnosis not present

## 2017-08-23 DIAGNOSIS — J324 Chronic pansinusitis: Secondary | ICD-10-CM | POA: Diagnosis not present

## 2017-08-24 DIAGNOSIS — J324 Chronic pansinusitis: Secondary | ICD-10-CM | POA: Diagnosis not present

## 2017-09-07 ENCOUNTER — Ambulatory Visit: Payer: Medicare Other | Admitting: Internal Medicine

## 2017-09-17 ENCOUNTER — Encounter: Payer: Self-pay | Admitting: Hematology & Oncology

## 2017-09-17 ENCOUNTER — Other Ambulatory Visit: Payer: Self-pay

## 2017-09-17 ENCOUNTER — Inpatient Hospital Stay: Payer: Medicare Other | Attending: Hematology & Oncology | Admitting: Hematology & Oncology

## 2017-09-17 ENCOUNTER — Inpatient Hospital Stay: Payer: Medicare Other

## 2017-09-17 VITALS — BP 130/61 | HR 78 | Temp 97.5°F | Resp 20 | Wt 312.0 lb

## 2017-09-17 DIAGNOSIS — Z7982 Long term (current) use of aspirin: Secondary | ICD-10-CM

## 2017-09-17 DIAGNOSIS — D45 Polycythemia vera: Secondary | ICD-10-CM

## 2017-09-17 LAB — CBC WITH DIFFERENTIAL (CANCER CENTER ONLY)
Basophils Absolute: 0.3 10*3/uL — ABNORMAL HIGH (ref 0.0–0.1)
Basophils Relative: 3 %
Eosinophils Absolute: 0.3 10*3/uL (ref 0.0–0.5)
Eosinophils Relative: 4 %
HCT: 43.1 % (ref 38.7–49.9)
Hemoglobin: 13.5 g/dL (ref 13.0–17.1)
Lymphocytes Relative: 19 %
Lymphs Abs: 1.6 10*3/uL (ref 0.9–3.3)
MCH: 31.9 pg (ref 28.0–33.4)
MCHC: 31.3 g/dL — ABNORMAL LOW (ref 32.0–35.9)
MCV: 101.9 fL — ABNORMAL HIGH (ref 82.0–98.0)
Monocytes Absolute: 0.7 10*3/uL (ref 0.1–0.9)
Monocytes Relative: 9 %
Neutro Abs: 5.3 10*3/uL (ref 1.5–6.5)
Neutrophils Relative %: 65 %
Platelet Count: 417 10*3/uL — ABNORMAL HIGH (ref 145–400)
RBC: 4.23 MIL/uL (ref 4.20–5.70)
RDW: 15.3 % (ref 11.1–15.7)
WBC Count: 8.1 10*3/uL (ref 4.0–10.0)

## 2017-09-17 LAB — CMP (CANCER CENTER ONLY)
ALT: 36 U/L (ref 0–55)
AST: 37 U/L — ABNORMAL HIGH (ref 5–34)
Albumin: 3.6 g/dL (ref 3.5–5.0)
Alkaline Phosphatase: 75 U/L (ref 40–150)
Anion gap: 7 (ref 3–11)
BUN: 15 mg/dL (ref 7–26)
CO2: 20 mmol/L — ABNORMAL LOW (ref 22–29)
Calcium: 8.5 mg/dL (ref 8.4–10.4)
Chloride: 111 mmol/L — ABNORMAL HIGH (ref 98–109)
Creatinine: 1.13 mg/dL (ref 0.70–1.30)
GFR, Est AFR Am: >60 mL/min (ref >60)
GFR, Estimated: >60 mL/min (ref >60)
Glucose, Bld: 169 mg/dL — ABNORMAL HIGH (ref 70–140)
Potassium: 4.3 mmol/L (ref 3.5–5.1)
Sodium: 138 mmol/L (ref 136–145)
Total Bilirubin: 0.4 mg/dL (ref 0.2–1.2)
Total Protein: 7 g/dL (ref 6.4–8.3)

## 2017-09-17 NOTE — Progress Notes (Signed)
Hematology and Oncology Follow Up Visit  William Villanueva [id number] 1950/10/23 67 y.o. 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 back for followup.  His back is doing a lot better.  Still has some pain but he is tolerating this much better.  He has had no other complaints.  He has had no chest wall discomfort.  He has had no cough or shortness of breath.  He still is dealing with the workers compensation issue.  Is been 5 years now.  [place]  He has had no fever.  He has had no rashes.  There is been no leg swelling.  He does have the pacemaker in.  This is been working pretty well.  His iron studies that we drew back in March showed a ferritin of 19 with an iron saturation of 9%.  Overall, his performance status is ECOG 1.    Medications:  Current Outpatient Medications:  .  aspirin 81 MG tablet, Take 81 mg by mouth daily., Disp: , Rfl:  .  carvedilol (COREG) 12.5 MG tablet, Take 1 tablet (12.5 mg total) by mouth 2 (two) times daily with a meal., Disp: 180 tablet, Rfl: 2 .  citalopram (CELEXA) 10 MG tablet, Take 5 mg by mouth daily. , Disp: , Rfl:  .  ENTRESTO 24-26 MG, TAKE ONE TABLET BY MOUTH TWICE A DAY, Disp: 180 tablet, Rfl: 1 .  furosemide (LASIX) 20 MG tablet, TAKE ONE TABLET BY MOUTH DAILY, Disp: 60 tablet, Rfl: 10 .  hydroxyurea (HYDREA) 500 MG capsule, TAKE ONE TABLET EVERY OTHER DAY AND 2 TABLETS THEN OTHER DAYS, Disp: 135 capsule, Rfl: 2 .  mesalamine (APRISO) 0.375 g 24 hr capsule, Take 375 mg by mouth as directed., Disp: , Rfl:  .  Methotrexate Sodium (METHOTREXATE, PF,) 250 MG/10ML injection, Inject 6 mg into the vein once a week., Disp: , Rfl:  .   omeprazole (PRILOSEC) 20 MG capsule, Take 20 mg by mouth 2 (two) times daily before a meal. , Disp: , Rfl:  .  Probiotic Product (ALIGN PO), Take by mouth as directed., Disp: , Rfl:  .  silodosin (RAPAFLO) 8 MG CAPS capsule, Take 8 mg by mouth daily. , Disp: , Rfl:  .  SPIRIVA HANDIHALER 18 MCG inhalation capsule, , Disp: , Rfl:  .  spironolactone (ALDACTONE) 25 MG tablet, Take 0.5 tablets (12.5 mg total) by mouth daily., Disp: 45 tablet, Rfl: 3 .  tamsulosin (FLOMAX) 0.4 MG CAPS capsule, Take 0.4 mg by mouth., Disp: , Rfl:  No current facility-administered medications for this visit.   Facility-Administered Medications Ordered in Other Visits:  .  0.9 %  sodium chloride infusion, , Intravenous, Continuous, Lynard Postlewait, PRudell Cobb MD, Stopped at 05/16/13 1115  Allergies:  Allergies  Allergen Reactions  . Bupropion Hives  . Fluoxetine Itching  . Ibuprofen Itching  . Prednisone Itching and Other (See Comments)    Abdominal pain Other reaction(s): Abdominal Pain   . Temazepam Other (See Comments)    Dizziness   . Trazodone And Nefazodone Other (See Comments)    Dizziness  . Cephalexin Diarrhea    Caused C-diff Caused C-diff   . Fluoxetine  Hcl Itching  . Prozac [Fluoxetine Hcl] Itching    Past Medical History, Surgical history, Social history, and Family History were reviewed and updated.  Review of Systems: Review of Systems  Constitutional: Negative.   HENT: Negative.   Eyes: Negative.   Respiratory: Positive for shortness of breath.   Cardiovascular: Positive for chest pain.  Gastrointestinal: Negative.   Genitourinary: Negative.   Musculoskeletal: Positive for back pain.  Skin: Negative.   Neurological: Negative.   Endo/Heme/Allergies: Negative.   Psychiatric/Behavioral: Negative.     Physical Exam:  vitals were not taken for this visit.   Physical Exam  Constitutional: He is oriented to person, place, and time.  HENT:  Head: Normocephalic and atraumatic.   Mouth/Throat: Oropharynx is clear and moist.  Eyes: Pupils are equal, round, and reactive to light. EOM are normal.  Neck: Normal range of motion.  Cardiovascular: Normal rate, regular rhythm and normal heart sounds.  Pulmonary/Chest: Effort normal and breath sounds normal.  Abdominal: Soft. Bowel sounds are normal.  Musculoskeletal: Normal range of motion. He exhibits no edema, tenderness or deformity.  Lymphadenopathy:    He has no cervical adenopathy.  Neurological: He is alert and oriented to person, place, and time.  Skin: Skin is warm and dry. No rash noted. No erythema.  Psychiatric: He has a normal mood and affect. His behavior is normal. Judgment and thought content normal.  Vitals reviewed.    Lab Results  Component Value Date   WBC 8.1 09/17/2017   HGB 13.5 09/17/2017   HCT 43.1 09/17/2017   MCV 101.9 (H) 09/17/2017   PLT 417 (H) 09/17/2017     Chemistry      Component Value Date/Time   NA 136 07/17/2017 1040   NA 141 04/10/2017 0923   NA 140 04/05/2016 0739   K 4.6 07/17/2017 1040   K 3.5 04/10/2017 0923   K 4.2 04/05/2016 0739   CL 106 07/17/2017 1040   CL 108 04/10/2017 0923   CO2 18 (L) 07/17/2017 1040   CO2 24 04/10/2017 0923   CO2 20 (L) 04/05/2016 0739   BUN 16 07/17/2017 1040   BUN 12 04/10/2017 0923   BUN 14.7 04/05/2016 0739   CREATININE 1.16 07/17/2017 1040   CREATININE 1.1 04/10/2017 0923   CREATININE 1.1 04/05/2016 0739      Component Value Date/Time   CALCIUM 8.5 07/17/2017 1040   CALCIUM 8.4 04/10/2017 0923   CALCIUM 9.0 04/05/2016 0739   ALKPHOS 84 07/17/2017 1040   ALKPHOS 77 04/10/2017 0923   ALKPHOS 91 04/05/2016 0739   AST 28 07/17/2017 1040   AST 34 04/05/2016 0739   ALT 31 07/17/2017 1040   ALT 32 04/10/2017 0923   ALT 40 04/05/2016 0739   BILITOT 0.5 07/17/2017 1040   BILITOT 0.48 04/05/2016 0739      Impression and Plan: William Villanueva is 67 year old gentleman with polycythemia vera.   His hematocrit is doing quite  well.  As such, I just do not think that we have to put him through a phlebotomy right now.  I will plan to see him back in 2 more months.    Volanda Napoleon, MD 5/13/20198:20 AM

## 2017-09-18 DIAGNOSIS — Z79899 Other long term (current) drug therapy: Secondary | ICD-10-CM | POA: Diagnosis not present

## 2017-09-18 DIAGNOSIS — M545 Low back pain: Secondary | ICD-10-CM | POA: Diagnosis not present

## 2017-09-18 DIAGNOSIS — M0579 Rheumatoid arthritis with rheumatoid factor of multiple sites without organ or systems involvement: Secondary | ICD-10-CM | POA: Diagnosis not present

## 2017-09-18 DIAGNOSIS — M1A09X0 Idiopathic chronic gout, multiple sites, without tophus (tophi): Secondary | ICD-10-CM | POA: Diagnosis not present

## 2017-09-18 DIAGNOSIS — M2550 Pain in unspecified joint: Secondary | ICD-10-CM | POA: Diagnosis not present

## 2017-09-19 ENCOUNTER — Ambulatory Visit (INDEPENDENT_AMBULATORY_CARE_PROVIDER_SITE_OTHER): Payer: Medicare Other | Admitting: *Deleted

## 2017-09-19 DIAGNOSIS — I5022 Chronic systolic (congestive) heart failure: Secondary | ICD-10-CM

## 2017-09-19 DIAGNOSIS — I429 Cardiomyopathy, unspecified: Secondary | ICD-10-CM

## 2017-09-20 NOTE — Progress Notes (Signed)
Remote pacemaker transmission.   

## 2017-09-21 ENCOUNTER — Encounter: Payer: Self-pay | Admitting: Cardiology

## 2017-09-24 ENCOUNTER — Telehealth: Payer: Self-pay | Admitting: Internal Medicine

## 2017-09-24 NOTE — Telephone Encounter (Signed)
Spoke with pt regarding VHR episode from 09/16/17 pt denies any dizziness or syncope.

## 2017-09-24 NOTE — Telephone Encounter (Signed)
New Message     1. Has your device fired? no  2. Is you device beeping? no  3. Are you experiencing draining or swelling at device site? No   4. Are you calling to see if we received your device transmission? no  5. Have you passed out?  No   Patient is calling due to a phone call on the 15th that indicated that when they did the pacer check it was picking up some irregular beats. Please call to discuss.    Please route to [place]

## 2017-09-25 LAB — CUP PACEART REMOTE DEVICE CHECK
Battery Remaining Longevity: 86 mo
Battery Remaining Percentage: 95.5 %
Battery Voltage: 2.96 V
Brady Statistic AP VP Percent: 9 %
Brady Statistic AP VS Percent: 1 %
Brady Statistic AS VP Percent: 89 %
Brady Statistic AS VS Percent: 1 %
Brady Statistic RA Percent Paced: 7 %
Date Time Interrogation Session: 20190515140013
Implantable Lead Implant Date: 20151223
Implantable Lead Implant Date: 20151223
Implantable Lead Implant Date: 20151223
Implantable Lead Location: 753858
Implantable Lead Location: 753859
Implantable Lead Location: 753860
Implantable Pulse Generator Implant Date: 20151223
Lead Channel Impedance Value: 450 Ohm
Lead Channel Impedance Value: 460 Ohm
Lead Channel Impedance Value: 640 Ohm
Lead Channel Pacing Threshold Amplitude: 1 V
Lead Channel Pacing Threshold Amplitude: 1.125 V
Lead Channel Pacing Threshold Amplitude: 1.125 V
Lead Channel Pacing Threshold Pulse Width: 0.4 ms
Lead Channel Pacing Threshold Pulse Width: 0.4 ms
Lead Channel Pacing Threshold Pulse Width: 0.7 ms
Lead Channel Sensing Intrinsic Amplitude: 5 mV
Lead Channel Sensing Intrinsic Amplitude: 8.5 mV
Lead Channel Setting Pacing Amplitude: 2 V
Lead Channel Setting Pacing Amplitude: 2.125
Lead Channel Setting Pacing Amplitude: 2.125
Lead Channel Setting Pacing Pulse Width: 0.4 ms
Lead Channel Setting Pacing Pulse Width: 0.7 ms
Lead Channel Setting Sensing Sensitivity: 2 mV
Pulse Gen Model: 3242
Pulse Gen Serial Number: 7701275

## 2017-10-16 ENCOUNTER — Encounter: Payer: Self-pay | Admitting: Emergency Medicine

## 2017-10-16 ENCOUNTER — Ambulatory Visit (INDEPENDENT_AMBULATORY_CARE_PROVIDER_SITE_OTHER): Payer: Medicare Other | Admitting: Emergency Medicine

## 2017-10-16 DIAGNOSIS — G4730 Sleep apnea, unspecified: Secondary | ICD-10-CM | POA: Diagnosis not present

## 2017-10-16 DIAGNOSIS — J449 Chronic obstructive pulmonary disease, unspecified: Secondary | ICD-10-CM | POA: Diagnosis not present

## 2017-10-16 MED ORDER — ALBUTEROL SULFATE HFA 108 (90 BASE) MCG/ACT IN AERS: 2.0000 | INHALATION_SPRAY | RESPIRATORY_TRACT | 5 refills | Status: DC | PRN

## 2017-10-16 NOTE — Assessment & Plan Note (Signed)
We discussed CPAP.  At this time no interest in restarting.

## 2017-10-16 NOTE — Progress Notes (Signed)
Subjective:    Patient ID: William Villanueva, male    DOB: 02-Mar-1951, 67 y.o.   MRN: [id number]  COPD  He complains of shortness of breath. There is no cough or wheezing. Associated symptoms include sneezing. Pertinent negatives include no ear pain, fever, headaches, postnasal drip, rhinorrhea, sore throat or trouble swallowing. His past medical history is significant for COPD.   67 year old former smoker (45 pack years) with a history of second-degree AV block (paced) nonischemic cardiomyopathy, polycythemia vera followed by Dr Katheran Awe.  Also with RA followed by Rheumatology, on MTX for last 3 months. He is referred today for evaluation of dyspnea.  He underwent pulmonary function testing on 06/04/17 that I have reviewed.  His spirometry shows severe obstruction, lung volumes are normal which suggest possible coexisting restriction, fusion capacity decreased and does not correct for alveolar volume.  He had a sleep study, was formerly on CPAP. He stopped it after he kept getting sinus infections, used for 1 yr,.   He describes exertional SOB progressive over over a year. He has gained 50 lbs over that time. His exercise is also limited by back pain from a work injury. He hears wheeze at rest or w exertion. He has cough, has been worse lately since a URI, mostly at night. He was treated w abx in 12/18, now started on another course yesterday. Occasional reflux.    ROV 07/12/17 --this is a follow-up visit for multifactorial dyspnea in the setting of history of tobacco use, nonischemic cardiomyopathy, untreated sleep apnea, deconditioning.  He has documented severe obstructive lung disease noted on spirometry.  At his last visit we did a walking oximetry and he did not desaturate.  We started [employer] and he reports that he has tolerated. He believes that the Stiolto has helped him, although he hurt his back and it has limited his ability to test the stiolto fully.        ROV 10/16/17 --patient has a history  of severe obstructive lung disease based on his spirometry.  He also has a nonischemic cardiomyopathy, untreated sleep apnea that contribute to dyspnea.  We have tried him on Stiolto but it was not cost effective.  I also tried him on Anoro but he lost his voice with the powdered formulation.  He was changed to Spiriva at his last visit to this office with T Parrett, but he hasn't taken it, didn't fill it.  He was treated for chronic sinusitis by Dr Janace Hoard and has improved.   He is uninterested in retrying CPAP, is concerned that it has contributed to lung infections.  He continues to have exertional SOB.  Review of Systems  Constitutional: Negative for fever and unexpected weight change.  HENT: Positive for congestion and sneezing. Negative for dental problem, ear pain, nosebleeds, postnasal drip, rhinorrhea, sinus pressure, sore throat and trouble swallowing.   Eyes: Negative for redness and itching.  Respiratory: Positive for shortness of breath. Negative for cough, chest tightness and wheezing.   Cardiovascular: Negative for palpitations and leg swelling.  Gastrointestinal: Negative for nausea and vomiting.  Genitourinary: Negative for dysuria.  Musculoskeletal: Negative for joint swelling.  Skin: Negative for rash.  Neurological: Negative for headaches.  Hematological: Does not bruise/bleed easily.  Psychiatric/Behavioral: Negative for dysphoric mood. The patient is not nervous/anxious.     Past Medical History:  Diagnosis Date  . AV block, 2nd degree 2015   St. Jude Allure Quadra pulse generator [id number], model PM 3242  . Back pain   . Cardiomyopathy ([place])    Nonischemic 45%.   . CHF (congestive heart failure) ([place])   . Gout   . Hemochromatosis   . Hypertension   . Hypospadias January 30, 1951   born with  . Nephrolithiasis   . 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 Heart disease Maternal Grandmother        Pacemaker, MI  . Stroke Mother   . Other Father        Deceased, car fell on him  . Diabetes Sister   . Hypertension Sister      Social History   Socioeconomic History  . Marital status: Married    Spouse name: Not on file  . Number of children: Not on file  . Years of education: Not on file  . Highest education level: Not on file  Occupational History  . Not on file  Social Needs  . Financial resource strain: Not on file  . Food insecurity:    Worry: Not on file    Inability: Not on file  . Transportation needs:    Medical: Not on file    Non-medical: Not on file  Tobacco Use  . Smoking status: Former Smoker    Packs/day: 1.00    Years: 44.00    Pack years: 44.00    Types: Cigarettes    Start date: 06/05/1968    Last attempt to quit: 04/05/2013    Years since quitting: 4.5  . Smokeless tobacco: Never Used  . Tobacco comment: quit 2 years ago  Substance and Sexual Activity  . Alcohol use: Yes    Alcohol/week: 0.0 oz    Comment: Rare - once per month  . Drug use: No  . Sexual activity: Not on file  Lifestyle  . Physical activity:    Days per week: Not on file    Minutes per session: Not on file  . Stress: Not on file  Relationships  . Social connections:    Talks on phone: Not on file    Gets together: Not on file    Attends religious service: Not on file    Active member of club or organization: Not on file    Attends meetings of clubs or organizations: Not on file    Relationship status:  Not on file  . Intimate partner violence:    Fear of current or ex partner: Not on file    Emotionally abused: Not on file    Physically abused: Not on file    Forced sexual activity: Not on file  Other Topics Concern  . Not on  file  Social History Narrative   Lives at home with wife in a one story home.  Has no children.  Does not work.  Getting workman's comp.  Education: 4 years trade school.      Allergies  Allergen Reactions  . Bupropion Hives  . Fluoxetine Itching  . Ibuprofen Itching  . Prednisone Itching and Other (See Comments)    Abdominal pain Other reaction(s): Abdominal Pain   . Temazepam Other (See Comments)    Dizziness   . Trazodone And Nefazodone Other (See Comments)    Dizziness  . Cephalexin Diarrhea    Caused C-diff Caused C-diff   . Fluoxetine Hcl Itching  . Prozac [Fluoxetine Hcl] Itching     Outpatient Medications Prior to Visit  Medication Sig Dispense Refill  . aspirin 81 MG tablet Take 81 mg by mouth daily.    . carvedilol (COREG) 12.5 MG tablet Take 1 tablet (12.5 mg total) by mouth 2 (two) times daily with a meal. 180 tablet 2  . citalopram (CELEXA) 10 MG tablet Take 5 mg by mouth daily.     [name] ENTRESTO 24-26 MG TAKE ONE TABLET BY MOUTH TWICE A DAY 180 tablet 1  . furosemide (LASIX) 20 MG tablet TAKE ONE TABLET BY MOUTH DAILY 60 tablet 10  . hydroxyurea (HYDREA) 500 MG capsule TAKE ONE TABLET EVERY OTHER DAY AND 2 TABLETS THEN OTHER DAYS 135 capsule 2  . mesalamine (APRISO) 0.375 g 24 hr capsule Take 375 mg by mouth as directed.    . Methotrexate Sodium (METHOTREXATE, PF,) 250 MG/10ML injection Inject 6 mg into the vein once a week.    [name] omeprazole (PRILOSEC) 20 MG capsule Take 20 mg by mouth 2 (two) times daily before a meal.     . Probiotic Product (ALIGN PO) Take by mouth as directed.    [name] spironolactone (ALDACTONE) 25 MG tablet Take 0.5 tablets (12.5 mg total) by mouth daily. 45 tablet 3  . tamsulosin (FLOMAX) 0.4 MG CAPS capsule Take 0.4  mg by mouth.    . silodosin (RAPAFLO) 8 MG CAPS capsule Take 8 mg by mouth daily.     [name] SPIRIVA HANDIHALER 18 MCG inhalation capsule      Facility-Administered Medications Prior to Visit  Medication Dose Route Frequency Provider Last Rate Last Dose  . 0.9 %  sodium chloride infusion   Intravenous Continuous Volanda Napoleon, MD   Stopped at 05/16/13 1115        Objective:   Physical Exam Vitals:   10/16/17 1327  BP: 132/76  Pulse: 77  SpO2: 97%  Weight: (!) 313 lb (142 kg)  Height: 5' 10"  (1.778 m)   Gen: Pleasant, obese man, in no distress,  normal affect  ENT: No lesions,  mouth clear,  oropharynx clear, no postnasal drip  Neck: No JVD, no stridor  Lungs: No use of accessory muscles, no wheezing, decreased at both bases  Cardiovascular: RRR, heart sounds normal, no murmur or gallops, no peripheral edema  Musculoskeletal: No deformities, no cyanosis or clubbing  Neuro: alert, non focal  Skin: Warm, no lesions or rash      Assessment & Plan:  COPD (chronic obstructive pulmonary disease) (HCC) We will hold off on starting a scheduled inhaled medication right now.  Depending on how you progress we may revisit starting an every day inhaler in the future. We will start albuterol for you to use 2 puffs up to every 4 hours if needed for shortness of breath, chest  tightness, wheezing.  Keep this medication available so that he can use it as needed. Follow with Dr Lamonte Sakai in 6 months or sooner if you have any problems  Sleep apnea We discussed CPAP.  At this time no interest in restarting.  Baltazar Apo, MD, PhD 10/16/2017, 1:55 PM  Pulmonary and Critical Care [phone] or if no answer [phone]

## 2017-10-16 NOTE — Addendum Note (Signed)
Addended by: Desmond Dike C on: 10/16/2017 02:04 PM   Modules accepted: Orders

## 2017-10-16 NOTE — Patient Instructions (Signed)
We will hold off on starting a scheduled inhaled medication right now.  Depending on how you progress we may revisit starting an every day inhaler in the future. We will start albuterol for you to use 2 puffs up to every 4 hours if needed for shortness of breath, chest tightness, wheezing.  Keep this medication available so that he can use it as needed. We discussed CPAP.  At this time no interest in restarting. Follow with Dr Lamonte Sakai in 6 months or sooner if you have any problems

## 2017-10-16 NOTE — Assessment & Plan Note (Signed)
We will hold off on starting a scheduled inhaled medication right now.  Depending on how you progress we may revisit starting an every day inhaler in the future. We will start albuterol for you to use 2 puffs up to every 4 hours if needed for shortness of breath, chest tightness, wheezing.  Keep this medication available so that he can use it as needed. Follow with Dr Lamonte Sakai in 6 months or sooner if you have any problems

## 2017-10-22 ENCOUNTER — Other Ambulatory Visit: Payer: Self-pay | Admitting: Internal Medicine

## 2017-11-13 ENCOUNTER — Inpatient Hospital Stay: Payer: Medicare Other | Attending: Hematology & Oncology | Admitting: Hematology & Oncology

## 2017-11-13 ENCOUNTER — Inpatient Hospital Stay: Payer: Medicare Other

## 2017-11-13 VITALS — BP 115/70 | HR 78 | Temp 97.8°F | Wt 309.1 lb

## 2017-11-13 DIAGNOSIS — D45 Polycythemia vera: Secondary | ICD-10-CM

## 2017-11-13 DIAGNOSIS — I441 Atrioventricular block, second degree: Secondary | ICD-10-CM | POA: Diagnosis not present

## 2017-11-13 DIAGNOSIS — Z7982 Long term (current) use of aspirin: Secondary | ICD-10-CM | POA: Diagnosis not present

## 2017-11-13 LAB — CBC WITH DIFFERENTIAL (CANCER CENTER ONLY)
Basophils Absolute: 0.3 10*3/uL — ABNORMAL HIGH (ref 0.0–0.1)
Basophils Relative: 3 %
Eosinophils Absolute: 0.3 10*3/uL (ref 0.0–0.5)
Eosinophils Relative: 3 %
HCT: 45.1 % (ref 38.7–49.9)
Hemoglobin: 14 g/dL (ref 13.0–17.1)
Lymphocytes Relative: 16 %
Lymphs Abs: 1.6 10*3/uL (ref 0.9–3.3)
MCH: 30.2 pg (ref 28.0–33.4)
MCHC: 31 g/dL — ABNORMAL LOW (ref 32.0–35.9)
MCV: 97.2 fL (ref 82.0–98.0)
Monocytes Absolute: 0.9 10*3/uL (ref 0.1–0.9)
Monocytes Relative: 9 %
Neutro Abs: 7 10*3/uL — ABNORMAL HIGH (ref 1.5–6.5)
Neutrophils Relative %: 69 %
Platelet Count: 343 10*3/uL (ref 145–400)
RBC: 4.64 MIL/uL (ref 4.20–5.70)
RDW: 15.6 % (ref 11.1–15.7)
WBC Count: 10.1 10*3/uL — ABNORMAL HIGH (ref 4.0–10.0)

## 2017-11-13 LAB — CMP (CANCER CENTER ONLY)
ALT: 47 U/L (ref 10–47)
AST: 45 U/L — ABNORMAL HIGH (ref 11–38)
Albumin: 3.6 g/dL (ref 3.5–5.0)
Alkaline Phosphatase: 78 U/L (ref 26–84)
Anion gap: 13 (ref 5–15)
BUN: 13 mg/dL (ref 7–22)
CO2: 24 mmol/L (ref 18–33)
Calcium: 8.4 mg/dL (ref 8.0–10.3)
Chloride: 103 mmol/L (ref 98–108)
Creatinine: 1.1 mg/dL (ref 0.60–1.20)
Glucose, Bld: 189 mg/dL — ABNORMAL HIGH (ref 73–118)
Potassium: 3.9 mmol/L (ref 3.3–4.7)
Sodium: 140 mmol/L (ref 128–145)
Total Bilirubin: 0.9 mg/dL (ref 0.2–1.6)
Total Protein: 7.2 g/dL (ref 6.4–8.1)

## 2017-11-13 LAB — IRON AND TIBC
Iron: 39 ug/dL — ABNORMAL LOW (ref 42–163)
Saturation Ratios: 9 % — ABNORMAL LOW (ref 42–163)
TIBC: 441 ug/dL — ABNORMAL HIGH (ref 202–409)
UIBC: 402 ug/dL

## 2017-11-13 LAB — FERRITIN: Ferritin: 17 ng/mL — ABNORMAL LOW (ref 24–336)

## 2017-11-13 LAB — LACTATE DEHYDROGENASE: LDH: 267 U/L — ABNORMAL HIGH (ref 98–192)

## 2017-11-13 NOTE — Progress Notes (Signed)
Hematology and Oncology Follow Up Visit  William Villanueva [id number] [birth year] 67 y.o. 11/13/2017   Principle Diagnosis:   Polycythemia vera-JAK2 positive  Heart block-Mobitz II  Current Therapy:    Hydrea 559m po daily alternating with 1000 mg p.o.daily -dose changed on 04/10/2017  Aspirin 81 mg by mouth daily  Phlebotomy to maintain hematocrit below 45%     Interim History:  William Villanueva back for followup.  He is having a tough time today.  His back is really bothering him.  The humidity and heat really make a tough on him.  [place]  Is been canceled for times to date.  Hopefully, he will have it done.  Otherwise, he is doing okay.  His pacemaker is doing fine.  He has had no issues with his heart.  We clearly have made him iron deficient.  His iron studies back in March showed a ferritin of 19 with iron saturation of 9%.  Overall, his performance status is ECOG 1.    Medications:  Current Outpatient Medications:  .  albuterol (PROVENTIL HFA;VENTOLIN HFA) 108 (90 Base) MCG/ACT inhaler, Inhale 2 puffs into the lungs every 4 (four) hours as needed for wheezing or shortness of breath., Disp: 1 Inhaler, Rfl: 5 .  aspirin 81 MG tablet, Take 81 mg by mouth daily., Disp: , Rfl:  .  carvedilol (COREG) 12.5 MG tablet, Take 1 tablet (12.5 mg total) by mouth 2 (two) times daily with a meal., Disp: 180 tablet, Rfl: 2 .  citalopram (CELEXA) 10 MG tablet, Take 5 mg by mouth daily. , Disp: , Rfl:  .  ENTRESTO 24-26 MG, TAKE ONE TABLET BY MOUTH TWICE A DAY, Disp: 180 tablet, Rfl: 1 .  furosemide (LASIX) 20 MG tablet, TAKE ONE TABLET BY MOUTH DAILY, Disp: 60 tablet, Rfl: 10 .  hydroxyurea (HYDREA) 500 MG capsule, TAKE ONE TABLET EVERY OTHER DAY AND 2 TABLETS THEN OTHER DAYS, Disp: 135 capsule, Rfl: 2 .  mesalamine (APRISO) 0.375 g 24 hr capsule, Take 375 mg by mouth as directed., Disp: , Rfl:  .  Methotrexate Sodium  (METHOTREXATE, PF,) 250 MG/10ML injection, Inject 6 mg into the vein once a week., Disp: , Rfl:  .  omeprazole (PRILOSEC) 20 MG capsule, Take 20 mg by mouth 2 (two) times daily before a meal. , Disp: , Rfl:  .  Probiotic Product (ALIGN PO), Take by mouth as directed., Disp: , Rfl:  .  spironolactone (ALDACTONE) 25 MG tablet, Take 0.5 tablets (12.5 mg total) by mouth daily., Disp: 45 tablet, Rfl: 3 .  tamsulosin (FLOMAX) 0.4 MG CAPS capsule, Take 0.4 mg by mouth., Disp: , Rfl:  No current facility-administered medications for this visit.   Facility-Administered Medications Ordered in Other Visits:  .  0.9 %  sodium chloride infusion, , Intravenous, Continuous, Ennever, PRudell Cobb MD, Stopped at 05/16/13 1115  Allergies:  Allergies  Allergen Reactions  . Bupropion Hives  . Fluoxetine Itching  . Ibuprofen Itching  . Prednisone Itching and Other (See Comments)    Abdominal pain Other reaction(s): Abdominal Pain   . Temazepam Other (See Comments)    Dizziness   . Trazodone And Nefazodone Other (See Comments)    Dizziness  . Cephalexin Diarrhea    Caused C-diff Caused C-diff   . Fluoxetine Hcl Itching  . Prozac [Fluoxetine Hcl] Itching    Past Medical History, Surgical history, Social history, and Family History were reviewed  and updated.  Review of Systems: Review of Systems  Constitutional: Negative.   HENT: Negative.   Eyes: Negative.   Respiratory: Positive for shortness of breath.   Cardiovascular: Positive for chest pain.  Gastrointestinal: Negative.   Genitourinary: Negative.   Musculoskeletal: Positive for back pain.  Skin: Negative.   Neurological: Negative.   Endo/Heme/Allergies: Negative.   Psychiatric/Behavioral: Negative.     Physical Exam:  weight is 309 lb 1 oz (140.2 kg) (abnormal). His oral temperature is 97.8 F (36.6 C). His blood pressure is 115/70 and his pulse is 78.   Physical Exam  Constitutional: He is oriented to person, place, and time.   HENT:  Head: Normocephalic and atraumatic.  Mouth/Throat: Oropharynx is clear and moist.  Eyes: Pupils are equal, round, and reactive to light. EOM are normal.  Neck: Normal range of motion.  Cardiovascular: Normal rate, regular rhythm and normal heart sounds.  Pulmonary/Chest: Effort normal and breath sounds normal.  Abdominal: Soft. Bowel sounds are normal.  Musculoskeletal: Normal range of motion. He exhibits no edema, tenderness or deformity.  Lymphadenopathy:    He has no cervical adenopathy.  Neurological: He is alert and oriented to person, place, and time.  Skin: Skin is warm and dry. No rash noted. No erythema.  Psychiatric: He has a normal mood and affect. His behavior is normal. Judgment and thought content normal.  Vitals reviewed.    Lab Results  Component Value Date   WBC 10.1 (H) 11/13/2017   HGB 14.0 11/13/2017   HCT 45.1 11/13/2017   MCV 97.2 11/13/2017   PLT 343 11/13/2017     Chemistry      Component Value Date/Time   NA 140 11/13/2017 0756   NA 141 04/10/2017 0923   NA 140 04/05/2016 0739   K 3.9 11/13/2017 0756   K 3.5 04/10/2017 0923   K 4.2 04/05/2016 0739   CL 103 11/13/2017 0756   CL 108 04/10/2017 0923   CO2 24 11/13/2017 0756   CO2 24 04/10/2017 0923   CO2 20 (L) 04/05/2016 0739   BUN 13 11/13/2017 0756   BUN 12 04/10/2017 0923   BUN 14.7 04/05/2016 0739   CREATININE 1.10 11/13/2017 0756   CREATININE 1.1 04/10/2017 0923   CREATININE 1.1 04/05/2016 0739      Component Value Date/Time   CALCIUM 8.4 11/13/2017 0756   CALCIUM 8.4 04/10/2017 0923   CALCIUM 9.0 04/05/2016 0739   ALKPHOS 78 11/13/2017 0756   ALKPHOS 77 04/10/2017 0923   ALKPHOS 91 04/05/2016 0739   AST 45 (H) 11/13/2017 0756   AST 34 04/05/2016 0739   ALT 47 11/13/2017 0756   ALT 32 04/10/2017 0923   ALT 40 04/05/2016 0739   BILITOT 0.9 11/13/2017 0756   BILITOT 0.48 04/05/2016 0739      Impression and Plan: William Villanueva is 67 year old gentleman with polycythemia  vera.  He does need to have a phlebotomy.  However, he does not want one today.  I think we are okay given his inadequate just being barely above 45%.  We will see him back in 2 months.  I will try to get him to the rest of the summer.  Volanda Napoleon, MD 7/9/20198:36 AM

## 2017-11-30 DIAGNOSIS — Z86010 Personal history of colonic polyps: Secondary | ICD-10-CM | POA: Diagnosis not present

## 2017-11-30 DIAGNOSIS — D120 Benign neoplasm of cecum: Secondary | ICD-10-CM | POA: Diagnosis not present

## 2017-11-30 DIAGNOSIS — K641 Second degree hemorrhoids: Secondary | ICD-10-CM | POA: Diagnosis not present

## 2017-11-30 DIAGNOSIS — K5730 Diverticulosis of large intestine without perforation or abscess without bleeding: Secondary | ICD-10-CM | POA: Diagnosis not present

## 2017-12-19 DIAGNOSIS — Z79899 Other long term (current) drug therapy: Secondary | ICD-10-CM | POA: Diagnosis not present

## 2017-12-19 DIAGNOSIS — M1A09X0 Idiopathic chronic gout, multiple sites, without tophus (tophi): Secondary | ICD-10-CM | POA: Diagnosis not present

## 2017-12-19 DIAGNOSIS — M2550 Pain in unspecified joint: Secondary | ICD-10-CM | POA: Diagnosis not present

## 2017-12-19 DIAGNOSIS — Z6841 Body Mass Index (BMI) 40.0 and over, adult: Secondary | ICD-10-CM | POA: Diagnosis not present

## 2017-12-19 DIAGNOSIS — M0579 Rheumatoid arthritis with rheumatoid factor of multiple sites without organ or systems involvement: Secondary | ICD-10-CM | POA: Diagnosis not present

## 2017-12-19 DIAGNOSIS — M545 Low back pain: Secondary | ICD-10-CM | POA: Diagnosis not present

## 2017-12-20 ENCOUNTER — Ambulatory Visit (INDEPENDENT_AMBULATORY_CARE_PROVIDER_SITE_OTHER): Payer: Medicare Other | Admitting: *Deleted

## 2017-12-20 DIAGNOSIS — I10 Essential (primary) hypertension: Secondary | ICD-10-CM

## 2017-12-20 DIAGNOSIS — I441 Atrioventricular block, second degree: Secondary | ICD-10-CM | POA: Diagnosis not present

## 2017-12-20 NOTE — Progress Notes (Signed)
Remote pacemaker transmission.   

## 2017-12-31 ENCOUNTER — Other Ambulatory Visit: Payer: Self-pay | Admitting: Cardiology

## 2017-12-31 DIAGNOSIS — I429 Cardiomyopathy, unspecified: Secondary | ICD-10-CM

## 2018-01-04 ENCOUNTER — Other Ambulatory Visit: Payer: Self-pay | Admitting: Cardiology

## 2018-01-04 DIAGNOSIS — R0602 Shortness of breath: Secondary | ICD-10-CM

## 2018-01-04 NOTE — Telephone Encounter (Signed)
Rx sent to pharmacy   

## 2018-01-09 ENCOUNTER — Encounter: Payer: Self-pay | Admitting: Emergency Medicine

## 2018-01-09 ENCOUNTER — Ambulatory Visit: Payer: Medicare Other | Admitting: Emergency Medicine

## 2018-01-09 DIAGNOSIS — G4730 Sleep apnea, unspecified: Secondary | ICD-10-CM | POA: Diagnosis not present

## 2018-01-09 DIAGNOSIS — Z23 Encounter for immunization: Secondary | ICD-10-CM

## 2018-01-09 DIAGNOSIS — J449 Chronic obstructive pulmonary disease, unspecified: Secondary | ICD-10-CM | POA: Diagnosis not present

## 2018-01-09 MED ORDER — AEROCHAMBER MV MISC: 0 refills | Status: DC

## 2018-01-09 MED ORDER — TIOTROPIUM BROMIDE-OLODATEROL 2.5-2.5 MCG/ACT IN AERS: 2.0000 | INHALATION_SPRAY | Freq: Every day | RESPIRATORY_TRACT | 11 refills | Status: DC

## 2018-01-09 MED ORDER — TIOTROPIUM BROMIDE-OLODATEROL 2.5-2.5 MCG/ACT IN AERS: 2.0000 | INHALATION_SPRAY | Freq: Every day | RESPIRATORY_TRACT | 0 refills | Status: DC

## 2018-01-09 NOTE — Patient Instructions (Addendum)
Agree with follow up with Dr Stanford Breed regarding your chest pain.  Please restart Stiolto 2 puffs once daily.  We will call this prescription to your pharmacy.  Please let us know if it is not covered by her insurance so that we can assess for an alternative or write for an exception to get the [place] [facility] covered. Keep your albuterol available to use 2 puffs up to every 4 hours as needed for shortness of breath, chest tightness, wheezing.  We will give you a spacer so that you can deliver this more effectively and keep it from irritating your throat. We will give the Prevnar 13 vaccine today. You need to get the flu shot next month. Follow with Dr Lamonte Sakai in 4 months or sooner if you have any problems.

## 2018-01-09 NOTE — Assessment & Plan Note (Signed)
Not interested in starting CPAP.

## 2018-01-09 NOTE — Assessment & Plan Note (Signed)
Agree with follow up with Dr Stanford Breed regarding your chest pain.  Please restart Stiolto 2 puffs once daily.  We will call this prescription to your pharmacy.  Please let us know if it is not covered by her insurance so that we can assess for an alternative or write for an exception to get the [place] covered. Keep your albuterol available to use 2 puffs up to every 4 hours as needed for shortness of breath, chest tightness, wheezing.  We will give you a spacer so that you can deliver this more effectively and keep it from irritating your throat. We will give the Prevnar 13 vaccine today. You need to get the flu shot next month. Follow with Dr Lamonte Sakai in 4 months or sooner if you have any problems.

## 2018-01-09 NOTE — Addendum Note (Signed)
Addended by: Lorretta Harp on: 01/09/2018 03:06 PM   Modules accepted: Orders

## 2018-01-09 NOTE — Progress Notes (Signed)
Subjective:    Patient ID: William Villanueva, male    DOB: [birth year], 67 y.o.   MRN: [id number]  COPD  He complains of shortness of breath. There is no cough or wheezing. Associated symptoms include sneezing. Pertinent negatives include no ear pain, fever, headaches, postnasal drip, rhinorrhea, sore throat or trouble swallowing. His past medical history is significant for COPD.   67 year old former smoker (45 pack years) with a history of second-degree AV block (paced) nonischemic cardiomyopathy, polycythemia vera followed by Dr Katheran Awe.  Also with RA followed by Rheumatology, on MTX for last 3 months. He is referred today for evaluation of dyspnea.  He underwent pulmonary function testing on 06/04/17 that I have reviewed.  His spirometry shows severe obstruction, lung volumes are normal which suggest possible coexisting restriction, fusion capacity decreased and does not correct for alveolar volume.  He had a sleep study, was formerly on CPAP. He stopped it after he kept getting sinus infections, used for 1 yr,.   He describes exertional SOB progressive over over a year. He has gained 50 lbs over that time. His exercise is also limited by back pain from a work injury. He hears wheeze at rest or w exertion. He has cough, has been worse lately since a URI, mostly at night. He was treated w abx in 12/18, now started on another course yesterday. Occasional reflux.    ROV 07/12/17 --this is a follow-up visit for multifactorial dyspnea in the setting of history of tobacco use, nonischemic cardiomyopathy, untreated sleep apnea, deconditioning.  He has documented severe obstructive lung disease noted on spirometry.  At his last visit we did a walking oximetry and he did not desaturate.  We started [employer] and he reports that he has tolerated. He believes that the Stiolto has helped him, although he hurt his back and it has limited his ability to test the stiolto fully.        ROV 10/16/17 --patient has a history  of severe obstructive lung disease based on his spirometry.  He also has a nonischemic cardiomyopathy, untreated sleep apnea that contribute to dyspnea.  We have tried him on Stiolto but it was not cost effective.  I also tried him on Anoro but he lost his voice with the powdered formulation.  He was changed to Spiriva at his last visit to this office with T Parrett, but he hasn't taken it, didn't fill it.  He was treated for chronic sinusitis by Dr Janace Hoard and has improved.   He is uninterested in retrying CPAP, is concerned that it has contributed to lung infections.  He continues to have exertional SOB.           ROV 01/09/18 --this is a follow-up visit for 68 year old gentleman with severe obstructive lung disease.  He also has a history of untreated obstructive sleep apnea, nonischemic cardiomyopathy.  He has been on Stiolto before, benefited from it but it was not formulary on his insurance.  We subsequently tried Anoro but he did not tolerate due to side effects.  He now has a new insurance and would be willing to retry the [employer]. He has albuterol to use prn, but it causes throat irritation also. He tells me that he is having chest pain - two real types, a focal pain that can happen at random; some mid CP that can be random as well.  Review of Systems  Constitutional: Negative for fever and unexpected weight change.  HENT: Positive for congestion and sneezing. Negative for dental problem, ear pain, nosebleeds, postnasal drip, rhinorrhea, sinus pressure, sore  throat and trouble swallowing.   Eyes: Negative for redness and itching.  Respiratory: Positive for shortness of breath. Negative for cough, chest tightness and wheezing.   Cardiovascular: Negative for palpitations and leg swelling.  Gastrointestinal: Negative for nausea and vomiting.  Genitourinary: Negative for dysuria.  Musculoskeletal: Negative for joint swelling.  Skin: Negative for rash.  Neurological: Negative for headaches.  Hematological: Does not bruise/bleed easily.  Psychiatric/Behavioral: Negative for dysphoric mood. The patient is not nervous/anxious.    Past Medical History:  Diagnosis Date  . AV block, 2nd degree 2015   [employer]. [employer] Allure Quadra pulse generator [id number], model PM 3242  . Back pain   . Cardiomyopathy ([place])    Nonischemic 45%.   . CHF (congestive heart failure) ([place])   . Gout   . Hemochromatosis   . Hypertension   . Hypospadias 28-Feb-1951   born with  . Nephrolithiasis   . 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 Heart disease Maternal Grandmother        Pacemaker, [place]  . Stroke Mother   . Other Father        Deceased, car fell on him  . Diabetes Sister   . Hypertension Sister      Social History   Socioeconomic History  . Marital status: Married    Spouse name: Not on file  . Number of children: Not on file  . Years of education: Not on file  . Highest education level: Not on file  Occupational History  . Not on file  Social Needs  . Financial resource strain: Not on file  . Food insecurity:    Worry: Not on file    Inability: Not on file  . Transportation needs:    Medical: Not on file    Non-medical: Not on file  Tobacco Use  . Smoking status: Former Smoker    Packs/day: 1.00    Years: 44.00    Pack years: 44.00    Types: Cigarettes    Start date: 06/05/1968    Last attempt to quit: 04/05/2013    Years since quitting: 4.7  . Smokeless tobacco: Never Used  . Tobacco comment: quit 2  years ago  Substance and Sexual Activity  . Alcohol use: Yes    Alcohol/week: 0.0 standard drinks    Comment: Rare - once per month  . Drug use: No  . Sexual activity: Not on file  Lifestyle  . Physical activity:    Days per week: Not on file    Minutes per session: Not on file  . Stress: Not on file  Relationships  . Social connections:    Talks on phone: Not on file    Gets together: Not on file    Attends religious service: Not on file    Active member of club or organization: Not on file    Attends meetings of clubs or organizations: Not on file    Relationship status: Not on file  . Intimate partner violence:    Fear of current or ex partner: Not on file    Emotionally abused: Not on file    Physically abused: Not on file    Forced sexual activity: Not on file  Other Topics Concern  . Not  on file  Social History Narrative   Lives at home with wife in a one story home.  Has no children.  Does not work.  Getting workman's comp.  Education: 4 years trade school.      Allergies  Allergen Reactions  . Bupropion Hives  . Fluoxetine Itching  . Ibuprofen Itching  . Prednisone Itching and Other (See Comments)    Abdominal pain Other reaction(s): Abdominal Pain   . Temazepam Other (See Comments)    Dizziness   . Trazodone And Nefazodone Other (See Comments)    Dizziness  . Cephalexin Diarrhea    Caused C-diff Caused C-diff   . Fluoxetine Hcl Itching  . Prozac [Fluoxetine Hcl] Itching     Outpatient Medications Prior to Visit  Medication Sig Dispense Refill  . aspirin 81 MG tablet Take 81 mg by mouth daily.    . carvedilol (COREG) 12.5 MG tablet TAKE ONE TABLET BY MOUTH TWO TIMES A DAY WITH A MEAL 180 tablet 2  . citalopram (CELEXA) 10 MG tablet Take 5 mg by mouth daily.     [name] ENTRESTO 24-26 MG TAKE ONE TABLET BY MOUTH TWICE A DAY 180 tablet 2  . furosemide (LASIX) 20 MG tablet TAKE ONE TABLET BY MOUTH DAILY 60 tablet 10  . hydroxyurea (HYDREA) 500 MG capsule TAKE  ONE TABLET EVERY OTHER DAY AND 2 TABLETS THEN OTHER DAYS 135 capsule 2  . mesalamine (APRISO) 0.375 g 24 hr capsule Take 375 mg by mouth as directed.    . Methotrexate Sodium (METHOTREXATE, PF,) 250 MG/10ML injection Inject 6 mg into the vein once a week.    [name] omeprazole (PRILOSEC) 20 MG capsule Take 20 mg by mouth 2 (two) times daily before a meal.     . Probiotic Product (ALIGN PO) Take by mouth as directed.    [name] spironolactone (ALDACTONE) 25 MG tablet TAKE ONE-HALF TABLET BY MOUTH DAILY 45 tablet 2  . tamsulosin (FLOMAX) 0.4 MG CAPS capsule Take 0.4 mg by mouth.    [name] albuterol (PROVENTIL HFA;VENTOLIN HFA) 108 (90 Base) MCG/ACT inhaler Inhale 2 puffs into the lungs every 4 (four) hours as needed for wheezing or shortness of breath. 1 Inhaler 5   Facility-Administered Medications Prior to Visit  Medication Dose Route Frequency Provider Last Rate Last Dose  . 0.9 %  sodium chloride infusion   Intravenous Continuous Volanda Napoleon, MD   Stopped at 05/16/13 1115        Objective:   Physical Exam Vitals:   01/09/18 1421  BP: 124/76  Pulse: 60  SpO2: 96%  Weight: (!) 308 lb (139.7 kg)  Height: 5' 10"  (1.778 m)   Gen: Pleasant, obese man, in no distress,  normal affect  ENT: No lesions,  mouth clear,  oropharynx clear, no postnasal drip  Neck: No JVD, no stridor  Lungs: No use of accessory muscles, no wheezing, decreased at both bases  Cardiovascular: RRR, heart sounds normal, no murmur or gallops, no peripheral edema  Musculoskeletal: No deformities, no cyanosis or clubbing  Neuro: alert, non focal  Skin: Warm, no lesions or rash      Assessment & Plan:  COPD (chronic obstructive pulmonary disease) ([place]) Agree with follow up with Dr Stanford Breed regarding your chest pain.  Please restart Stiolto 2 puffs once daily.  We will call this prescription to your pharmacy.  Please let us know if it is not covered by her insurance so that we can assess for an alternative or write for  an exception to get the [place] covered. Keep your albuterol available to use 2 puffs up to every 4 hours as needed for shortness of breath, chest tightness, wheezing.  We will give you a spacer so that you can deliver this more effectively and keep it from irritating your throat. We will give the Prevnar 13 vaccine today. You need to get the flu shot next month. Follow with Dr Lamonte Sakai in 4 months or sooner if you have any problems.  Sleep apnea Not interested in starting CPAP.   Baltazar Apo, MD, PhD 01/09/2018, 2:45 PM [facility] [phone] or if no answer [phone]

## 2018-01-14 NOTE — Progress Notes (Signed)
HPI: FU cardiomyopathy/CHF. Echocardiogram in August of 2013 showed an ejection fraction of 35-40%. Cardiac catheterization in September of 2013 showed mild nonobstructive coronary disease. There was no hemodynamic evidence of restriction. Pulmonary capillary wedge pressure 10. Cardiac MRI in September of 2013 showed an ejection fraction of 34% with diffuse hypokinesis. There was no hyperenhancement or scar tissue and no evidence of cardiac hemochromatosis. TSH in September 2013 normal. 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 Patient subsequently had CRTP placed. Nuclear study March 2017 showed ejection fraction 32. There was prior inferior infarct but no ischemia. Treated medically as felt likely attenuation artifact. Had C. difficile colitis January 2018 associated with acute renal failure. Echocardiogram April 2018 showed ejection fraction 71-24%, grade 1 diastolic dysfunction and moderate left atrial enlargement. Since last seen,  he has dyspnea on exertion unchanged.  No orthopnea, PND, pedal edema.  Occasional pain in various locations on his chest.  No exertional chest pain or syncope.  Current Outpatient Medications  Medication Sig Dispense Refill  . aspirin 81 MG tablet Take 81 mg by mouth daily.    . carvedilol (COREG) 12.5 MG tablet TAKE ONE TABLET BY MOUTH TWO TIMES A DAY WITH A MEAL 180 tablet 2  . citalopram (CELEXA) 10 MG tablet Take 5 mg by mouth daily.     [name] ENTRESTO 24-26 MG TAKE ONE TABLET BY MOUTH TWICE A DAY 180 tablet 2  . furosemide (LASIX) 20 MG tablet TAKE ONE TABLET BY MOUTH DAILY 60 tablet 10  . hydroxyurea (HYDREA) 500 MG capsule TAKE ONE TABLET EVERY OTHER DAY AND 2 TABLETS THEN OTHER DAYS 135 capsule 2  . mesalamine (APRISO) 0.375 g 24 hr capsule Take 375 mg by mouth as directed.    . Methotrexate Sodium (METHOTREXATE, PF,) 250 MG/10ML injection Inject 6 mg into the vein once a week.    [name] omeprazole (PRILOSEC) 20 MG capsule Take 20 mg by mouth  2 (two) times daily before a meal.     . Probiotic Product (ALIGN PO) Take by mouth as directed.    [name] Spacer/Aero-Holding Chambers (AEROCHAMBER MV) inhaler Use as instructed 1 each 0  . spironolactone (ALDACTONE) 25 MG tablet TAKE ONE-HALF TABLET BY MOUTH DAILY 45 tablet 2  . tamsulosin (FLOMAX) 0.4 MG CAPS capsule Take 0.4 mg by mouth.    . Tiotropium Bromide-Olodaterol (STIOLTO RESPIMAT) 2.5-2.5 MCG/ACT AERS Inhale 2 puffs into the lungs daily. 1 Inhaler 11   No current facility-administered medications for this visit.    Facility-Administered Medications Ordered in Other Visits  Medication Dose Route Frequency Provider Last Rate Last Dose  . 0.9 %  sodium chloride infusion   Intravenous Continuous Volanda Napoleon, MD   Stopped at 05/16/13 1115     Past Medical History:  Diagnosis Date  . AV block, 2nd degree 2015   St. Jude Allure Quadra pulse generator [id number], model PM 3242  . Back pain   . Cardiomyopathy ([place])    Nonischemic 45%.   . CHF (congestive heart failure) ([place])   . Gout   . Hemochromatosis   . Hypertension   . Hypospadias 1950-08-26   born with  . Nephrolithiasis   . Polycythemia vera(238.4)     Past Surgical History:  Procedure Laterality Date  . ANKLE SURGERY    . BI-VENTRICULAR PACEMAKER INSERTION N/A 04/29/2014   Procedure: BI-VENTRICULAR PACEMAKER INSERTION (CRT-P);  Surgeon: Deboraha Sprang, MD; Laterality: Left  Ali Chuk pulse generator [id number],  model PM 3242  . PILONIDAL CYST EXCISION    . POSTERIOR LAMINECTOMY / DECOMPRESSION LUMBAR SPINE    . TONSILLECTOMY      Social History   Socioeconomic History  . Marital status: Married    Spouse name: Not on file  . Number of children: Not on file  . Years of education: Not on file  . Highest education level: Not on file  Occupational History  . Not on file  Social Needs  . Financial resource strain: Not on file  . Food insecurity:    Worry: Not on file    Inability: Not on file  .  Transportation needs:    Medical: Not on file    Non-medical: Not on file  Tobacco Use  . Smoking status: Former Smoker    Packs/day: 1.00    Years: 44.00    Pack years: 44.00    Types: Cigarettes    Start date: 06/05/1968    Last attempt to quit: 04/05/2013    Years since quitting: 4.7  . Smokeless tobacco: Never Used  . Tobacco comment: quit 2 years ago  Substance and Sexual Activity  . Alcohol use: Yes    Alcohol/week: 0.0 standard drinks    Comment: Rare - once per month  . Drug use: No  . Sexual activity: Not on file  Lifestyle  . Physical activity:    Days per week: Not on file    Minutes per session: Not on file  . Stress: Not on file  Relationships  . Social connections:    Talks on phone: Not on file    Gets together: Not on file    Attends religious service: Not on file    Active member of club or organization: Not on file    Attends meetings of clubs or organizations: Not on file    Relationship status: Not on file  . Intimate partner violence:    Fear of current or ex partner: Not on file    Emotionally abused: Not on file    Physically abused: Not on file    Forced sexual activity: Not on file  Other Topics Concern  . Not on file  Social History Narrative   Lives at home with wife in a one story home.  Has no children.  Does not work.  Getting workman's comp.  Education: 4 years trade school.     Family History  Problem Relation Age of Onset  . Heart disease Maternal Grandmother        Pacemaker, MI  . Stroke Mother   . Other Father        Deceased, car fell on him  . Diabetes Sister   . Hypertension Sister     ROS: Back pain and arthralgias but no fevers or chills, productive cough, hemoptysis, dysphasia, odynophagia, melena, hematochezia, dysuria, hematuria, rash, seizure activity, orthopnea, PND, pedal edema, claudication. Remaining systems are negative.  Physical Exam: Well-developed morbidly obese in no acute distress.  Skin is warm and dry.   HEENT is normal.  Neck is supple.  Chest is clear to auscultation with normal expansion.  Cardiovascular exam is regular rate and rhythm.  Abdominal exam nontender or distended. No masses palpated. Extremities show no edema. neuro grossly intact  ECG-sinus rhythm at a rate of 81.  Ventricular paced rhythm.  Personally reviewed  A/P  1 nonischemic cardiomyopathy-plan to continue medical therapy.  Continue Entresto and carvedilol.  Most recent echocardiogram showed mildly reduced LV function.  Check  potassium and renal function.  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  We will continue with present dose of diuretics.  Patient instructed on fluid restriction and low-sodium diet.  3 CRT-P-followed by electrophysiology.  4 morbid obesity-we discussed the importance of weight loss.  5 hypertension-blood pressure is controlled.  Continue present medications.  Kirk Ruths, MD

## 2018-01-15 ENCOUNTER — Ambulatory Visit: Payer: Medicare Other | Admitting: Hematology & Oncology

## 2018-01-15 ENCOUNTER — Other Ambulatory Visit: Payer: Medicare Other

## 2018-01-16 ENCOUNTER — Ambulatory Visit (INDEPENDENT_AMBULATORY_CARE_PROVIDER_SITE_OTHER): Payer: PPO | Admitting: Cardiology

## 2018-01-16 ENCOUNTER — Inpatient Hospital Stay: Payer: PPO

## 2018-01-16 ENCOUNTER — Inpatient Hospital Stay (HOSPITAL_BASED_OUTPATIENT_CLINIC_OR_DEPARTMENT_OTHER): Payer: PPO | Admitting: Hematology & Oncology

## 2018-01-16 ENCOUNTER — Inpatient Hospital Stay: Payer: PPO | Attending: Hematology & Oncology

## 2018-01-16 ENCOUNTER — Other Ambulatory Visit: Payer: Self-pay

## 2018-01-16 ENCOUNTER — Encounter: Payer: Self-pay | Admitting: Cardiology

## 2018-01-16 ENCOUNTER — Encounter: Payer: Self-pay | Admitting: Hematology & Oncology

## 2018-01-16 VITALS — BP 96/56 | HR 81 | Ht 70.0 in | Wt 307.1 lb

## 2018-01-16 VITALS — BP 95/46 | HR 77 | Temp 98.1°F | Resp 16 | Wt 307.0 lb

## 2018-01-16 DIAGNOSIS — I10 Essential (primary) hypertension: Secondary | ICD-10-CM

## 2018-01-16 DIAGNOSIS — I420 Dilated cardiomyopathy: Secondary | ICD-10-CM | POA: Diagnosis not present

## 2018-01-16 DIAGNOSIS — J449 Chronic obstructive pulmonary disease, unspecified: Secondary | ICD-10-CM | POA: Insufficient documentation

## 2018-01-16 DIAGNOSIS — I441 Atrioventricular block, second degree: Secondary | ICD-10-CM | POA: Insufficient documentation

## 2018-01-16 DIAGNOSIS — Z7982 Long term (current) use of aspirin: Secondary | ICD-10-CM | POA: Insufficient documentation

## 2018-01-16 DIAGNOSIS — D45 Polycythemia vera: Secondary | ICD-10-CM

## 2018-01-16 DIAGNOSIS — I5022 Chronic systolic (congestive) heart failure: Secondary | ICD-10-CM

## 2018-01-16 LAB — CMP (CANCER CENTER ONLY)
ALT: 38 U/L (ref 0–44)
AST: 52 U/L — ABNORMAL HIGH (ref 15–41)
Albumin: 3.5 g/dL (ref 3.5–5.0)
Alkaline Phosphatase: 77 U/L (ref 38–126)
Anion gap: 13 (ref 5–15)
BUN: 20 mg/dL (ref 8–23)
CO2: 19 mmol/L — ABNORMAL LOW (ref 22–32)
Calcium: 8.7 mg/dL — ABNORMAL LOW (ref 8.9–10.3)
Chloride: 107 mmol/L (ref 98–111)
Creatinine: 1.44 mg/dL — ABNORMAL HIGH (ref 0.61–1.24)
GFR, Est AFR Am: 57 mL/min — ABNORMAL LOW (ref >60)
GFR, Estimated: 49 mL/min — ABNORMAL LOW (ref >60)
Glucose, Bld: 259 mg/dL — ABNORMAL HIGH (ref 70–99)
Potassium: 4.1 mmol/L (ref 3.5–5.1)
Sodium: 139 mmol/L (ref 135–145)
Total Bilirubin: 0.6 mg/dL (ref 0.3–1.2)
Total Protein: 7.2 g/dL (ref 6.5–8.1)

## 2018-01-16 LAB — CBC WITH DIFFERENTIAL (CANCER CENTER ONLY)
Basophils Absolute: 0.3 10*3/uL — ABNORMAL HIGH (ref 0.0–0.1)
Basophils Relative: 3 %
Eosinophils Absolute: 0.2 10*3/uL (ref 0.0–0.5)
Eosinophils Relative: 3 %
HCT: 44.4 % (ref 38.7–49.9)
Hemoglobin: 13.7 g/dL (ref 13.0–17.1)
Lymphocytes Relative: 12 %
Lymphs Abs: 1.1 10*3/uL (ref 0.9–3.3)
MCH: 29.9 pg (ref 28.0–33.4)
MCHC: 30.9 g/dL — ABNORMAL LOW (ref 32.0–35.9)
MCV: 96.9 fL (ref 82.0–98.0)
Monocytes Absolute: 0.8 10*3/uL (ref 0.1–0.9)
Monocytes Relative: 8 %
Neutro Abs: 7 10*3/uL — ABNORMAL HIGH (ref 1.5–6.5)
Neutrophils Relative %: 74 %
Platelet Count: 469 10*3/uL — ABNORMAL HIGH (ref 145–400)
RBC: 4.58 MIL/uL (ref 4.20–5.70)
RDW: 15.7 % (ref 11.1–15.7)
WBC Count: 9.4 10*3/uL (ref 4.0–10.0)

## 2018-01-16 LAB — LACTATE DEHYDROGENASE: LDH: 285 U/L — ABNORMAL HIGH (ref 98–192)

## 2018-01-16 NOTE — Progress Notes (Signed)
Hematology and Oncology Follow Up Visit  William Villanueva [id number] [birth year] 67 y.o. 01/16/2018   Principle Diagnosis:   Polycythemia vera-JAK2 positive  Heart block-[place] II  Current Therapy:    Hydrea 542m po daily alternating with 1000 mg p.o.daily -dose changed on 04/10/2017  Aspirin 81 mg by mouth daily  Phlebotomy to maintain hematocrit below 45%     Interim History:  Mr.  William Villanueva back for followup.  His problem now is that he has COPD.  He has some pulmonary function test done a couple months ago.  He sees a pulmonologist.  He is on some inhalers.  He is not bothered as much by his back.  There is still the issues with disability.  His insurance was canceled.  I am not sure how he is going to be able to afford insurance on his own.  He clearly has iron deficiency from his phlebotomies.  His iron studies back in July showed a ferritin of 17 with an iron saturation of 9%. Overall, his performance status is ECOG 1.    Medications:  Current Outpatient Medications:  .  aspirin 81 MG tablet, Take 81 mg by mouth daily., Disp: , Rfl:  .  carvedilol (COREG) 12.5 MG tablet, TAKE ONE TABLET BY MOUTH TWO TIMES A DAY WITH A MEAL, Disp: 180 tablet, Rfl: 2 .  citalopram (CELEXA) 10 MG tablet, Take 5 mg by mouth daily. , Disp: , Rfl:  .  ENTRESTO 24-26 MG, TAKE ONE TABLET BY MOUTH TWICE A DAY, Disp: 180 tablet, Rfl: 2 .  furosemide (LASIX) 20 MG tablet, TAKE ONE TABLET BY MOUTH DAILY, Disp: 60 tablet, Rfl: 10 .  hydroxyurea (HYDREA) 500 MG capsule, TAKE ONE TABLET EVERY OTHER DAY AND 2 TABLETS THEN OTHER DAYS, Disp: 135 capsule, Rfl: 2 .  mesalamine (APRISO) 0.375 g 24 hr capsule, Take 375 mg by mouth as directed., Disp: , Rfl:  .  Methotrexate Sodium (METHOTREXATE, PF,) 250 MG/10ML injection, Inject 6 mg into the vein once a week., Disp: , Rfl:  .  omeprazole (PRILOSEC) 20 MG capsule, Take 20 mg by mouth 2 (two) times daily before a meal. , Disp: , Rfl:  .  Probiotic Product  (ALIGN PO), Take by mouth as directed., Disp: , Rfl:  .  Spacer/Aero-Holding Chambers (AEROCHAMBER MV) inhaler, Use as instructed, Disp: 1 each, Rfl: 0 .  spironolactone (ALDACTONE) 25 MG tablet, TAKE ONE-HALF TABLET BY MOUTH DAILY, Disp: 45 tablet, Rfl: 2 .  tamsulosin (FLOMAX) 0.4 MG CAPS capsule, Take 0.4 mg by mouth., Disp: , Rfl:  .  Tiotropium Bromide-Olodaterol (STIOLTO RESPIMAT) 2.5-2.5 MCG/ACT AERS, Inhale 2 puffs into the lungs daily., Disp: 1 Inhaler, Rfl: 11 No current facility-administered medications for this visit.   Facility-Administered Medications Ordered in Other Visits:  .  0.9 %  sodium chloride infusion, , Intravenous, Continuous, Ennever, PRudell Cobb MD, Stopped at 05/16/13 1115  Allergies:  Allergies  Allergen Reactions  . Bupropion Hives  . Fluoxetine Itching  . Ibuprofen Itching  . Prednisone Itching and Other (See Comments)    Abdominal pain Other reaction(s): Abdominal Pain   . Temazepam Other (See Comments)    Dizziness   . Trazodone And Nefazodone Other (See Comments)    Dizziness  . Cephalexin Diarrhea    Caused C-diff Caused C-diff   . Fluoxetine Hcl Itching  . Prozac [Fluoxetine Hcl] Itching    Past Medical History, Surgical history, Social history, and Family History were reviewed and updated.  Review of Systems:  Review of Systems  Constitutional: Negative.   HENT: Negative.   Eyes: Negative.   Respiratory: Positive for shortness of breath.   Cardiovascular: Positive for chest pain.  Gastrointestinal: Negative.   Genitourinary: Negative.   Musculoskeletal: Positive for back pain.  Skin: Negative.   Neurological: Negative.   Endo/Heme/Allergies: Negative.   Psychiatric/Behavioral: Negative.     Physical Exam:  weight is 307 lb (139.3 kg) (abnormal). His oral temperature is 98.1 F (36.7 C). His blood pressure is 95/46 (abnormal) and his pulse is 77. His respiration is 16 and oxygen saturation is 97%.   Physical Exam  Constitutional:  He is oriented to person, place, and time.  HENT:  Head: Normocephalic and atraumatic.  Mouth/Throat: Oropharynx is clear and moist.  Eyes: Pupils are equal, round, and reactive to light. EOM are normal.  Neck: Normal range of motion.  Cardiovascular: Normal rate, regular rhythm and normal heart sounds.  Pulmonary/Chest: Effort normal and breath sounds normal.  Abdominal: Soft. Bowel sounds are normal.  Musculoskeletal: Normal range of motion. He exhibits no edema, tenderness or deformity.  Lymphadenopathy:    He has no cervical adenopathy.  Neurological: He is alert and oriented to person, place, and time.  Skin: Skin is warm and dry. No rash noted. No erythema.  Psychiatric: He has a normal mood and affect. His behavior is normal. Judgment and thought content normal.  Vitals reviewed.    Lab Results  Component Value Date   WBC 9.4 01/16/2018   HGB 13.7 01/16/2018   HCT 44.4 01/16/2018   MCV 96.9 01/16/2018   PLT 469 (H) 01/16/2018     Chemistry      Component Value Date/Time   NA 140 11/13/2017 0756   NA 141 04/10/2017 0923   NA 140 04/05/2016 0739   K 3.9 11/13/2017 0756   K 3.5 04/10/2017 0923   K 4.2 04/05/2016 0739   CL 103 11/13/2017 0756   CL 108 04/10/2017 0923   CO2 24 11/13/2017 0756   CO2 24 04/10/2017 0923   CO2 20 (L) 04/05/2016 0739   BUN 13 11/13/2017 0756   BUN 12 04/10/2017 0923   BUN 14.7 04/05/2016 0739   CREATININE 1.10 11/13/2017 0756   CREATININE 1.1 04/10/2017 0923   CREATININE 1.1 04/05/2016 0739      Component Value Date/Time   CALCIUM 8.4 11/13/2017 0756   CALCIUM 8.4 04/10/2017 0923   CALCIUM 9.0 04/05/2016 0739   ALKPHOS 78 11/13/2017 0756   ALKPHOS 77 04/10/2017 0923   ALKPHOS 91 04/05/2016 0739   AST 45 (H) 11/13/2017 0756   AST 34 04/05/2016 0739   ALT 47 11/13/2017 0756   ALT 32 04/10/2017 0923   ALT 40 04/05/2016 0739   BILITOT 0.9 11/13/2017 0756   BILITOT 0.48 04/05/2016 0739      Impression and Plan: William Villanueva  is 67 year old gentleman with polycythemia vera.  He is doing well with the polycythemia.  His platelet count is up a little bit higher today.  I am not going to change his Hydrea dose.  I will plan to get him back in 2 more months.  I think this would be very reasonable.  Hopefully, the COPD will not cause more problems for him.   Volanda Napoleon, MD 9/11/201911:24 AM

## 2018-01-16 NOTE — Patient Instructions (Signed)
Medication Instructions: Your physician recommends that you continue on your current medications as directed.    If you need a refill on your cardiac medications before your next appointment, please call your pharmacy.   Labwork: None   Procedures/Testing: None  Follow-Up: Your physician wants you to follow-up in 6 months with Dr. Stanford Breed. You will receive a reminder letter in the mail two months in advance. If you don't receive a letter, please call our office at [phone] to schedule this follow-up appointment.   Special Instructions:    Thank you for choosing [facility] at [facility]!!

## 2018-01-17 LAB — IRON AND TIBC
Iron: 34 ug/dL — ABNORMAL LOW (ref 42–163)
Saturation Ratios: 8 % — ABNORMAL LOW (ref 42–163)
TIBC: 406 ug/dL (ref 202–409)
UIBC: 372 ug/dL

## 2018-01-17 LAB — FERRITIN: Ferritin: 18 ng/mL — ABNORMAL LOW (ref 24–336)

## 2018-01-22 ENCOUNTER — Other Ambulatory Visit: Payer: Self-pay | Admitting: Internal Medicine

## 2018-01-22 DIAGNOSIS — R350 Frequency of micturition: Secondary | ICD-10-CM | POA: Diagnosis not present

## 2018-01-22 DIAGNOSIS — R3912 Poor urinary stream: Secondary | ICD-10-CM | POA: Diagnosis not present

## 2018-01-22 DIAGNOSIS — N401 Enlarged prostate with lower urinary tract symptoms: Secondary | ICD-10-CM | POA: Diagnosis not present

## 2018-01-28 LAB — CUP PACEART REMOTE DEVICE CHECK
Battery Remaining Longevity: 88 mo
Battery Remaining Percentage: 95.5 %
Battery Voltage: 2.96 V
Brady Statistic AP VP Percent: 10 %
Brady Statistic AP VS Percent: 1 %
Brady Statistic AS VP Percent: 87 %
Brady Statistic AS VS Percent: 1 %
Brady Statistic RA Percent Paced: 8.3 %
Date Time Interrogation Session: 20190814103758
Implantable Lead Implant Date: 20151223
Implantable Lead Implant Date: 20151223
Implantable Lead Implant Date: 20151223
Implantable Lead Location: 753858
Implantable Lead Location: 753859
Implantable Lead Location: 753860
Implantable Pulse Generator Implant Date: 20151223
Lead Channel Impedance Value: 450 Ohm
Lead Channel Impedance Value: 460 Ohm
Lead Channel Impedance Value: 750 Ohm
Lead Channel Pacing Threshold Amplitude: 1.125 V
Lead Channel Pacing Threshold Amplitude: 1.375 V
Lead Channel Pacing Threshold Amplitude: 1.5 V
Lead Channel Pacing Threshold Pulse Width: 0.4 ms
Lead Channel Pacing Threshold Pulse Width: 0.4 ms
Lead Channel Pacing Threshold Pulse Width: 0.7 ms
Lead Channel Sensing Intrinsic Amplitude: 10.1 mV
Lead Channel Sensing Intrinsic Amplitude: 3.5 mV
Lead Channel Setting Pacing Amplitude: 2.125
Lead Channel Setting Pacing Amplitude: 2.375
Lead Channel Setting Pacing Amplitude: 2.5 V
Lead Channel Setting Pacing Pulse Width: 0.4 ms
Lead Channel Setting Pacing Pulse Width: 0.7 ms
Lead Channel Setting Sensing Sensitivity: 2 mV
Pulse Gen Model: 3242
Pulse Gen Serial Number: 7701275

## 2018-02-07 ENCOUNTER — Telehealth: Payer: Self-pay | Admitting: Internal Medicine

## 2018-02-07 NOTE — Telephone Encounter (Signed)
New Message   Pt c/o of Chest Pain: STAT if CP now or developed within 24 hours  1. Are you having CP right now?  No   2. Are you experiencing any other symptoms (ex. SOB, nausea, vomiting, sweating)? Sob...but its normal   3. How long have you been experiencing CP? On and off for a few months  4. Is your CP continuous or coming and going? Coming and going   5. Have you taken Nitroglycerin? No   Spoke with patients wife who states that the patient has been experiencing chest pain near the pacemaker and it feels like its a knot in that area sometimes. Please call to discuss. ?

## 2018-02-07 NOTE — Telephone Encounter (Signed)
Pt calling today with c/o pain around his device site. He states the pain is intermittent and does not believe it is aggravated by anything in particular.He states there is a large lump below the device site which also "comes and goes." Pt states he recently has be dx with COPD and does have a regular cough. Per Dr Olin Pia last OV note, pain at his device site is not new and believed to be exacerbated by coughing.   I have told pt I would forward his complaints to the device clinic for a further recommendation in case they would like him to come by the clinic for an assessment.

## 2018-02-07 NOTE — Telephone Encounter (Signed)
Called patient about his c/o pain just below his device site. Patient states the pain usually occurs after he gets out of bed at night to use the bathroom. Pain last 4-5 mins and then resolves on it's own. Patient also states that over the past month he's also noticed an occasional "knot" the size of the ppm just below the actual device. Patient states that the "knot" is not there at the moment.  Patient denies any recent fever/chills.  Patient denies any redness, edema, or drainage at the ppm site.  I told patient that I could bring him into the office this week for further assessment of his site, or he could call back if the "knot" comes back.  Patient states that he will call back.

## 2018-02-19 ENCOUNTER — Ambulatory Visit: Payer: PPO | Admitting: Internal Medicine

## 2018-02-19 ENCOUNTER — Encounter: Payer: Self-pay | Admitting: Internal Medicine

## 2018-02-19 VITALS — BP 110/68 | HR 78 | Ht 70.0 in | Wt 309.6 lb

## 2018-02-19 DIAGNOSIS — I442 Atrioventricular block, complete: Secondary | ICD-10-CM | POA: Diagnosis not present

## 2018-02-19 DIAGNOSIS — I5022 Chronic systolic (congestive) heart failure: Secondary | ICD-10-CM

## 2018-02-19 DIAGNOSIS — I10 Essential (primary) hypertension: Secondary | ICD-10-CM | POA: Diagnosis not present

## 2018-02-19 DIAGNOSIS — I428 Other cardiomyopathies: Secondary | ICD-10-CM | POA: Diagnosis not present

## 2018-02-19 DIAGNOSIS — Z950 Presence of cardiac pacemaker: Secondary | ICD-10-CM | POA: Diagnosis not present

## 2018-02-19 NOTE — Progress Notes (Signed)
Electrophysiology Office Note   Date:  02/19/2018   ID:  William Villanueva, DOB 1950/10/04, MRN [id number]  PCP:  [name], MD  Cardiologist:  [facility] Primary Electrophysiologist:    [name], MD    No chief complaint on file.    History of Present Illness: William Villanueva is a 67 y.o. male is   seen in follow-up for CRT-P implantation for nonischemic cardiomyopathy. He had presented to 12/15 with symptomatic high-grade heart block. Echo reevaluation 2/16 demonstrated near normalization of LV systolic function; he been readmitted at that time because of pain which dated back to his device implantation. Nuclear medicine 2/16 had an EF of 35%   3/17 Myoview 32% .  He has chronic somewhat worsening shortness of breath.  He is limited to less than 100 yards.  Exercise is mostly limited by back pain.  He does not have peripheral edema.  He has untreated sleep apnea.  (Unable to tolerate)  Has had episodic chest pains that last typically 1 or 2 minutes following exertion.  He thinks it may be related to 1 of his new inhalers as he has been diagnosed intercurrently with COPD and with discontinuation the pains have abated    Date Cr K Hgb  9/19 1.44 4.1 13.7           Past Medical History:  Diagnosis Date  . AV block, 2nd degree 2015   St. Jude Allure Quadra pulse generator [id number], model PM 3242  . Back pain   . Cardiomyopathy ([place])    Nonischemic 45%.   . CHF (congestive heart failure) ([place])   . Gout   . Hemochromatosis   . Hypertension   . Hypospadias [birth year]   born with  . Nephrolithiasis   . 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 ANKLE SURGERY    . BI-VENTRICULAR PACEMAKER INSERTION N/A 04/29/2014   Procedure: BI-VENTRICULAR PACEMAKER INSERTION (CRT-P);  Surgeon: Deboraha Sprang, MD; Laterality: Left  St. Jude Allure Quadra pulse generator [phone], model PM [phone]  . PILONIDAL CYST EXCISION    . POSTERIOR LAMINECTOMY /  DECOMPRESSION LUMBAR SPINE    . TONSILLECTOMY       Current Outpatient Medications  Medication Sig Dispense Refill  . aspirin 81 MG tablet Take 81 mg by mouth daily.    . carvedilol (COREG) 12.5 MG tablet TAKE ONE TABLET BY MOUTH TWO TIMES A DAY WITH A MEAL 180 tablet 2  . citalopram (CELEXA) 10 MG tablet Take 5 mg by mouth daily.     [name] ENTRESTO 24-26 MG TAKE ONE TABLET BY MOUTH TWICE A DAY 180 tablet 2  . furosemide (LASIX) 20 MG tablet TAKE ONE TABLET BY MOUTH DAILY 60 tablet 10  . hydroxyurea (HYDREA) 500 MG capsule TAKE ONE TABLET EVERY OTHER DAY AND 2 TABLETS THEN OTHER DAYS 135 capsule 2  . mesalamine (APRISO) 0.375 g 24 hr capsule Take 375 mg by mouth daily.     . Methotrexate Sodium (METHOTREXATE, PF,) 250 MG/10ML injection Inject 6 mg into the vein once a week.    [name] omeprazole (PRILOSEC) 20 MG capsule Take 20 mg by mouth 2 (two) times daily before a meal.     . Probiotic Product (ALIGN PO) Take 1 capsule by mouth daily.     [name] Spacer/Aero-Holding Chambers (AEROCHAMBER MV) inhaler Use as instructed 1 each 0  . spironolactone (ALDACTONE) 25 MG tablet TAKE ONE-HALF TABLET BY MOUTH DAILY 45 tablet 2  .  tamsulosin (FLOMAX) 0.4 MG CAPS capsule Take 0.4 mg by mouth.    . Tiotropium Bromide-Olodaterol (STIOLTO RESPIMAT) 2.5-2.5 MCG/ACT AERS Inhale 2 puffs into the lungs daily. 1 Inhaler 11   No current facility-administered medications for this visit.    Facility-Administered Medications Ordered in Other Visits  Medication Dose Route Frequency Provider Last Rate Last Dose  . 0.9 %  sodium chloride infusion   Intravenous Continuous Volanda Napoleon, MD   Stopped at 05/16/13 1115    Allergies:   Bupropion; Fluoxetine; Ibuprofen; Prednisone; Temazepam; Trazodone and nefazodone; Cephalexin; Fluoxetine hcl; and Prozac [fluoxetine hcl]   Social History:  The patient  reports that he quit smoking about 4 years ago. His smoking use included cigarettes. He started smoking about 49 years ago. He  has a 44.00 pack-year smoking history. He has never used smokeless tobacco. He reports that he drinks alcohol. He reports that he does not use drugs.   Family History:  The patient's    family history includes Diabetes in his sister; Heart disease in his maternal grandmother; Hypertension in his sister; Other in his father; Stroke in his mother.    ROS:  Please see the history of present illness and past medical history  Otherwise, all other systems were reviewed and were negative except .     PHYSICAL EXAM: VS:  BP 110/68   Pulse 78   Ht 5' 10"  (1.778 m)   Wt (!) 309 lb 9.6 oz (140.4 kg)   SpO2 96%   BMI 44.42 kg/m  , BMI Body mass index is 44.42 kg/m. Well developed and Morbidly obese in no acute distress HENT normal Neck supple  The patient's device was interrogated.  The information was reviewed. No changes were made in the programming.    Carotids brisk and full without bruits Clear Regular rate and rhythm, no murmurs or gallops Abd-soft with active BS without hepatomegaly No Clubbing cyanosis edema Skin-warm and dry A & Oriented  Grossly normal sensory and motor function    EKG: Personally reviewed  9/11   P-synchronous/ AV  pacing @ 81 14/13/44    Device interrogation is reviewed today in detail.  See PaceArt for details.   Recent Labs: 01/16/2018: ALT 38; BUN 20; Creatinine 1.44; Hemoglobin 13.7; Platelet Count 469; Potassium 4.1; Sodium 139    Lipid Panel     Component Value Date/Time   CHOL 126 04/29/2014 0139   TRIG 146 04/29/2014 0139   HDL 29 (L) 04/29/2014 0139   CHOLHDL 4.3 04/29/2014 0139   VLDL 29 04/29/2014 0139   LDLCALC 68 04/29/2014 0139     Wt Readings from Last 3 Encounters:  02/19/18 (!) 309 lb 9.6 oz (140.4 kg)  01/16/18 (!) 307 lb (139.3 kg)  01/16/18 (!) 307 lb 1.6 oz (139.3 kg)      Other studies Reviewed: Additional studies/ records that were reviewed today include Dr Morrison Old notes describing the discontinuation of ACE and  substitiution with ARB  ASSESSMENT AND PLAN:  NICM  CHF chronic sys/HFpFF  Hypertension  Morbidly obese   Pacemaker-CRT-St. Jude The patient's device was interrogated.  The information was reviewed. No changes were made in the programming.      Chest Pain    Patient's chest pain is likely noncardiac   we will follow this along.  Dyspnea on exertion likely related to Morbidly obesity   Have encouraged weight loss and sugggested water aaerobics  We spent more than 50% of our >25 min visit in face  to face counseling regarding the above   Disposition:   FU with AS 12 m Signed, [name], MD  02/19/2018 3:36 PM     [address] [address] [place] [address] [phone] (office) [phone] (fax)

## 2018-02-19 NOTE — Patient Instructions (Addendum)
Medication Instructions:  Your physician recommends that you continue on your current medications as directed. Please refer to the Current Medication list given to you today.  Labwork: None ordered.  Testing/Procedures: None ordered.  Follow-Up: Your physician recommends that you schedule a follow-up appointment in:   6 months with Dr Stanford Breed  12 months with Dr Caryl Comes  Remote monitoring is used to monitor your Pacemaker of ICD from home. This monitoring reduces the number of office visits required to check your device to one time per year. It allows [place] to keep an eye on the functioning of your device to ensure it is working properly. You are scheduled for a device check from home on 03/21/2018. You may send your transmission at any time that day. If you have a wireless device, the transmission will be sent automatically. After your physician reviews your transmission, you will receive a postcard with your next transmission date.    Any Other Special Instructions Will Be Listed Below (If Applicable).     If you need a refill on your cardiac medications before your next appointment, please call your pharmacy.

## 2018-02-25 LAB — CUP PACEART INCLINIC DEVICE CHECK
Battery Remaining Longevity: 84 mo
Battery Voltage: 2.96 V
Brady Statistic RA Percent Paced: 8.4 %
Brady Statistic RV Percent Paced: 98 %
Date Time Interrogation Session: 20191015191815
Implantable Lead Implant Date: 20151223
Implantable Lead Implant Date: 20151223
Implantable Lead Implant Date: 20151223
Implantable Lead Location: 753858
Implantable Lead Location: 753859
Implantable Lead Location: 753860
Implantable Pulse Generator Implant Date: 20151223
Lead Channel Impedance Value: 450 Ohm
Lead Channel Impedance Value: 462.5 Ohm
Lead Channel Impedance Value: 750 Ohm
Lead Channel Pacing Threshold Amplitude: 0.75 V
Lead Channel Pacing Threshold Amplitude: 0.75 V
Lead Channel Pacing Threshold Amplitude: 1 V
Lead Channel Pacing Threshold Amplitude: 1 V
Lead Channel Pacing Threshold Amplitude: 1.5 V
Lead Channel Pacing Threshold Amplitude: 1.5 V
Lead Channel Pacing Threshold Pulse Width: 0.4 ms
Lead Channel Pacing Threshold Pulse Width: 0.4 ms
Lead Channel Pacing Threshold Pulse Width: 0.4 ms
Lead Channel Pacing Threshold Pulse Width: 0.4 ms
Lead Channel Pacing Threshold Pulse Width: 0.7 ms
Lead Channel Pacing Threshold Pulse Width: 0.7 ms
Lead Channel Sensing Intrinsic Amplitude: 10.1 mV
Lead Channel Sensing Intrinsic Amplitude: 4.7 mV
Lead Channel Setting Pacing Amplitude: 1.875
Lead Channel Setting Pacing Amplitude: 2.125
Lead Channel Setting Pacing Amplitude: 2.375
Lead Channel Setting Pacing Pulse Width: 0.4 ms
Lead Channel Setting Pacing Pulse Width: 0.7 ms
Lead Channel Setting Sensing Sensitivity: 2 mV
Pulse Gen Model: 3242
Pulse Gen Serial Number: 7701275

## 2018-03-06 DIAGNOSIS — Z23 Encounter for immunization: Secondary | ICD-10-CM | POA: Diagnosis not present

## 2018-03-06 DIAGNOSIS — D224 Melanocytic nevi of scalp and neck: Secondary | ICD-10-CM | POA: Diagnosis not present

## 2018-03-06 DIAGNOSIS — L219 Seborrheic dermatitis, unspecified: Secondary | ICD-10-CM | POA: Diagnosis not present

## 2018-03-06 DIAGNOSIS — L723 Sebaceous cyst: Secondary | ICD-10-CM | POA: Diagnosis not present

## 2018-03-14 DIAGNOSIS — Z79899 Other long term (current) drug therapy: Secondary | ICD-10-CM | POA: Diagnosis not present

## 2018-03-14 DIAGNOSIS — M2550 Pain in unspecified joint: Secondary | ICD-10-CM | POA: Diagnosis not present

## 2018-03-14 DIAGNOSIS — M545 Low back pain: Secondary | ICD-10-CM | POA: Diagnosis not present

## 2018-03-14 DIAGNOSIS — M0579 Rheumatoid arthritis with rheumatoid factor of multiple sites without organ or systems involvement: Secondary | ICD-10-CM | POA: Diagnosis not present

## 2018-03-14 DIAGNOSIS — Z6841 Body Mass Index (BMI) 40.0 and over, adult: Secondary | ICD-10-CM | POA: Diagnosis not present

## 2018-03-14 DIAGNOSIS — M1A09X0 Idiopathic chronic gout, multiple sites, without tophus (tophi): Secondary | ICD-10-CM | POA: Diagnosis not present

## 2018-03-21 ENCOUNTER — Ambulatory Visit (INDEPENDENT_AMBULATORY_CARE_PROVIDER_SITE_OTHER): Payer: PPO | Admitting: *Deleted

## 2018-03-21 DIAGNOSIS — I442 Atrioventricular block, complete: Secondary | ICD-10-CM

## 2018-03-21 DIAGNOSIS — I428 Other cardiomyopathies: Secondary | ICD-10-CM

## 2018-03-21 NOTE — Progress Notes (Signed)
Remote pacemaker transmission.   

## 2018-03-22 DIAGNOSIS — Z23 Encounter for immunization: Secondary | ICD-10-CM | POA: Diagnosis not present

## 2018-03-25 DIAGNOSIS — E119 Type 2 diabetes mellitus without complications: Secondary | ICD-10-CM | POA: Diagnosis not present

## 2018-03-26 ENCOUNTER — Inpatient Hospital Stay: Payer: PPO | Attending: Hematology & Oncology | Admitting: Hematology & Oncology

## 2018-03-26 ENCOUNTER — Inpatient Hospital Stay: Payer: PPO

## 2018-03-26 VITALS — BP 99/76 | HR 88 | Temp 97.7°F | Resp 20 | Wt 309.2 lb

## 2018-03-26 DIAGNOSIS — D45 Polycythemia vera: Secondary | ICD-10-CM | POA: Insufficient documentation

## 2018-03-26 DIAGNOSIS — I441 Atrioventricular block, second degree: Secondary | ICD-10-CM | POA: Diagnosis not present

## 2018-03-26 DIAGNOSIS — J449 Chronic obstructive pulmonary disease, unspecified: Secondary | ICD-10-CM | POA: Insufficient documentation

## 2018-03-26 DIAGNOSIS — Z7982 Long term (current) use of aspirin: Secondary | ICD-10-CM | POA: Diagnosis not present

## 2018-03-26 LAB — CBC WITH DIFFERENTIAL (CANCER CENTER ONLY)
Abs Immature Granulocytes: 0.09 10*3/uL — ABNORMAL HIGH (ref 0.00–0.07)
Basophils Absolute: 0.3 10*3/uL — ABNORMAL HIGH (ref 0.0–0.1)
Basophils Relative: 3 %
Eosinophils Absolute: 0.3 10*3/uL (ref 0.0–0.5)
Eosinophils Relative: 3 %
HCT: 45 % (ref 39.0–52.0)
Hemoglobin: 13.9 g/dL (ref 13.0–17.0)
Immature Granulocytes: 1 %
Lymphocytes Relative: 15 %
Lymphs Abs: 1.5 10*3/uL (ref 0.7–4.0)
MCH: 29.8 pg (ref 26.0–34.0)
MCHC: 30.9 g/dL (ref 30.0–36.0)
MCV: 96.4 fL (ref 80.0–100.0)
Monocytes Absolute: 0.9 10*3/uL (ref 0.1–1.0)
Monocytes Relative: 9 %
Neutro Abs: 6.8 10*3/uL (ref 1.7–7.7)
Neutrophils Relative %: 69 %
Platelet Count: 403 10*3/uL — ABNORMAL HIGH (ref 150–400)
RBC: 4.67 MIL/uL (ref 4.22–5.81)
RDW: 15.6 % — ABNORMAL HIGH (ref 11.5–15.5)
WBC Count: 9.9 10*3/uL (ref 4.0–10.5)
nRBC: 0 % (ref 0.0–0.2)

## 2018-03-26 LAB — CMP (CANCER CENTER ONLY)
ALT: 35 U/L (ref 0–44)
AST: 42 U/L — ABNORMAL HIGH (ref 15–41)
Albumin: 3.6 g/dL (ref 3.5–5.0)
Alkaline Phosphatase: 82 U/L (ref 38–126)
Anion gap: 12 (ref 5–15)
BUN: 14 mg/dL (ref 8–23)
CO2: 21 mmol/L — ABNORMAL LOW (ref 22–32)
Calcium: 8.6 mg/dL — ABNORMAL LOW (ref 8.9–10.3)
Chloride: 106 mmol/L (ref 98–111)
Creatinine: 1.27 mg/dL — ABNORMAL HIGH (ref 0.61–1.24)
GFR, Est AFR Am: >60 mL/min (ref >60)
GFR, Estimated: 57 mL/min — ABNORMAL LOW (ref >60)
Glucose, Bld: 241 mg/dL — ABNORMAL HIGH (ref 70–99)
Potassium: 4.2 mmol/L (ref 3.5–5.1)
Sodium: 139 mmol/L (ref 135–145)
Total Bilirubin: 0.6 mg/dL (ref 0.3–1.2)
Total Protein: 7.2 g/dL (ref 6.5–8.1)

## 2018-03-26 LAB — IRON AND TIBC
Iron: 39 ug/dL — ABNORMAL LOW (ref 42–163)
Saturation Ratios: 9 % — ABNORMAL LOW (ref 20–55)
TIBC: 416 ug/dL — ABNORMAL HIGH (ref 202–409)
UIBC: 378 ug/dL — ABNORMAL HIGH (ref 117–376)

## 2018-03-26 LAB — FERRITIN: Ferritin: 18 ng/mL — ABNORMAL LOW (ref 24–336)

## 2018-03-26 LAB — LACTATE DEHYDROGENASE: LDH: 265 U/L — ABNORMAL HIGH (ref 98–192)

## 2018-03-26 NOTE — Progress Notes (Signed)
Hematology and Oncology Follow Up Visit  William Villanueva [id number] [birth year] 67 y.o. 03/26/2018   Principle Diagnosis:   Polycythemia vera-JAK2 positive  Heart block-Mobitz II  Current Therapy:    Hydrea 510m po daily alternating with 1000 mg p.o.daily -dose changed on 04/10/2017  Aspirin 81 mg by mouth daily  Phlebotomy to maintain hematocrit below 45%     Interim History:  Mr.  William Villanueva back for followup.  His problem now is that he has COPD.  He has some pulmonary function test done a couple months ago.  He sees a pulmonologist.  He is on some inhalers.  I do not know why he is on 4 different blood pressure medications.  He is on 2 diuretics.  His blood pressure is little bit on the low side.  I think that he can probably stop the spironolactone.  He has not had any issues with bleeding.  His back is about the same.  His weight is holding steady.  He does not need to be phlebotomized today.  His hematocrit is only 45% so I really do not think we have to phlebotomize him given his lower blood pressure.  Overall, his performance status is ECOG 1.    Medications:  Current Outpatient Medications:  .  aspirin 81 MG tablet, Take 81 mg by mouth daily., Disp: , Rfl:  .  carvedilol (COREG) 12.5 MG tablet, TAKE ONE TABLET BY MOUTH TWO TIMES A DAY WITH A MEAL, Disp: 180 tablet, Rfl: 2 .  citalopram (CELEXA) 10 MG tablet, Take 5 mg by mouth daily. , Disp: , Rfl:  .  ENTRESTO 24-26 MG, TAKE ONE TABLET BY MOUTH TWICE A DAY, Disp: 180 tablet, Rfl: 2 .  furosemide (LASIX) 20 MG tablet, TAKE ONE TABLET BY MOUTH DAILY, Disp: 60 tablet, Rfl: 10 .  hydroxyurea (HYDREA) 500 MG capsule, TAKE ONE TABLET EVERY OTHER DAY AND 2 TABLETS THEN OTHER DAYS, Disp: 135 capsule, Rfl: 2 .  mesalamine (APRISO) 0.375 g 24 hr capsule, Take 375 mg by mouth daily. , Disp: , Rfl:  .  Methotrexate Sodium (METHOTREXATE, PF,) 250 MG/10ML injection, Inject 6 mg into the vein once a week., Disp: , Rfl:  .   omeprazole (PRILOSEC) 20 MG capsule, Take 20 mg by mouth 2 (two) times daily before a meal. , Disp: , Rfl:  .  Probiotic Product (ALIGN PO), Take 1 capsule by mouth daily. , Disp: , Rfl:  .  Spacer/Aero-Holding Chambers (AEROCHAMBER MV) inhaler, Use as instructed, Disp: 1 each, Rfl: 0 .  spironolactone (ALDACTONE) 25 MG tablet, TAKE ONE-HALF TABLET BY MOUTH DAILY, Disp: 45 tablet, Rfl: 2 .  tamsulosin (FLOMAX) 0.4 MG CAPS capsule, Take 0.4 mg by mouth., Disp: , Rfl:  .  Tiotropium Bromide-Olodaterol (STIOLTO RESPIMAT) 2.5-2.5 MCG/ACT AERS, Inhale 2 puffs into the lungs daily., Disp: 1 Inhaler, Rfl: 11 No current facility-administered medications for this visit.   Facility-Administered Medications Ordered in Other Visits:  .  0.9 %  sodium chloride infusion, , Intravenous, Continuous, Ennever, PRudell Cobb MD, Stopped at 05/16/13 1115  Allergies:  Allergies  Allergen Reactions  . Bupropion Hives  . Fluoxetine Itching  . Ibuprofen Itching  . Prednisone Itching and Other (See Comments)    Abdominal pain Other reaction(s): Abdominal Pain   . Temazepam Other (See Comments)    Dizziness   . Trazodone And Nefazodone Other (See Comments)    Dizziness  . Cephalexin Diarrhea    Caused C-diff Caused C-diff   . Fluoxetine  Hcl Itching  . Prozac [Fluoxetine Hcl] Itching    Past Medical History, Surgical history, Social history, and Family History were reviewed and updated.  Review of Systems: Review of Systems  Constitutional: Negative.   HENT: Negative.   Eyes: Negative.   Respiratory: Positive for shortness of breath.   Cardiovascular: Positive for chest pain.  Gastrointestinal: Negative.   Genitourinary: Negative.   Musculoskeletal: Positive for back pain.  Skin: Negative.   Neurological: Negative.   Endo/Heme/Allergies: Negative.   Psychiatric/Behavioral: Negative.     Physical Exam:  weight is 309 lb 4 oz (140.3 kg) (abnormal). His oral temperature is 97.7 F (36.5 C). His  blood pressure is 99/76 and his pulse is 88. His respiration is 20 and oxygen saturation is 97%.   Physical Exam  Constitutional: He is oriented to person, place, and time.  HENT:  Head: Normocephalic and atraumatic.  Mouth/Throat: Oropharynx is clear and moist.  Eyes: Pupils are equal, round, and reactive to light. EOM are normal.  Neck: Normal range of motion.  Cardiovascular: Normal rate, regular rhythm and normal heart sounds.  Pulmonary/Chest: Effort normal and breath sounds normal.  Abdominal: Soft. Bowel sounds are normal.  Musculoskeletal: Normal range of motion. He exhibits no edema, tenderness or deformity.  Lymphadenopathy:    He has no cervical adenopathy.  Neurological: He is alert and oriented to person, place, and time.  Skin: Skin is warm and dry. No rash noted. No erythema.  Psychiatric: He has a normal mood and affect. His behavior is normal. Judgment and thought content normal.  Vitals reviewed.    Lab Results  Component Value Date   WBC 9.9 03/26/2018   HGB 13.9 03/26/2018   HCT 45.0 03/26/2018   MCV 96.4 03/26/2018   PLT 403 (H) 03/26/2018     Chemistry      Component Value Date/Time   NA 139 01/16/2018 1026   NA 141 04/10/2017 0923   NA 140 04/05/2016 0739   K 4.1 01/16/2018 1026   K 3.5 04/10/2017 0923   K 4.2 04/05/2016 0739   CL 107 01/16/2018 1026   CL 108 04/10/2017 0923   CO2 19 (L) 01/16/2018 1026   CO2 24 04/10/2017 0923   CO2 20 (L) 04/05/2016 0739   BUN 20 01/16/2018 1026   BUN 12 04/10/2017 0923   BUN 14.7 04/05/2016 0739   CREATININE 1.44 (H) 01/16/2018 1026   CREATININE 1.1 04/10/2017 0923   CREATININE 1.1 04/05/2016 0739      Component Value Date/Time   CALCIUM 8.7 (L) 01/16/2018 1026   CALCIUM 8.4 04/10/2017 0923   CALCIUM 9.0 04/05/2016 0739   ALKPHOS 77 01/16/2018 1026   ALKPHOS 77 04/10/2017 0923   ALKPHOS 91 04/05/2016 0739   AST 52 (H) 01/16/2018 1026   AST 34 04/05/2016 0739   ALT 38 01/16/2018 1026   ALT 32  04/10/2017 0923   ALT 40 04/05/2016 0739   BILITOT 0.6 01/16/2018 1026   BILITOT 0.48 04/05/2016 0739      Impression and Plan: [name] is 67 year old gentleman with polycythemia vera.  He is doing well with the polycythemia.  Everything really is holding steady with his blood counts.  Again we do not have to phlebotomize him today.  I will plan to get him back in 3 months.  I would think that in 3 months time, he might need to be phlebotomized.  We are trying to focus on his quality of life.    Volanda Napoleon,  MD 11/19/20198:42 AM

## 2018-04-11 DIAGNOSIS — M542 Cervicalgia: Secondary | ICD-10-CM | POA: Diagnosis not present

## 2018-04-11 DIAGNOSIS — M47812 Spondylosis without myelopathy or radiculopathy, cervical region: Secondary | ICD-10-CM | POA: Diagnosis not present

## 2018-04-17 ENCOUNTER — Telehealth: Payer: Self-pay | Admitting: Cardiology

## 2018-04-17 NOTE — Telephone Encounter (Signed)
Spoke with pharmacist, entresto PA approved.

## 2018-04-17 NOTE — Telephone Encounter (Signed)
New Message   Pt c/o medication issue:  1. Name of William Villanueva  2. How are you currently taking this medication (dosage and times per day)?   3. Are you having a reaction (difficulty breathing--STAT)?   4. What is your medication issue? William Villanueva  with my Cover my meds is calling in reference to a prior authorization for Entresto.

## 2018-04-17 NOTE — Telephone Encounter (Signed)
New Message   Pt c/o medication issue:  1. Name of [name]  2. How are you currently taking this medication (dosage and times per day)?   3. Are you having a reaction (difficulty breathing--STAT)?   4. What is your medication issue? William Villanueva  with my Cover my meds is calling in reference to a prior authorization for Entresto. Reference Key [facility]

## 2018-04-19 DIAGNOSIS — Z6841 Body Mass Index (BMI) 40.0 and over, adult: Secondary | ICD-10-CM | POA: Diagnosis not present

## 2018-04-19 DIAGNOSIS — M7918 Myalgia, other site: Secondary | ICD-10-CM | POA: Diagnosis not present

## 2018-04-19 DIAGNOSIS — M47812 Spondylosis without myelopathy or radiculopathy, cervical region: Secondary | ICD-10-CM | POA: Diagnosis not present

## 2018-04-25 ENCOUNTER — Other Ambulatory Visit: Payer: Self-pay

## 2018-04-25 ENCOUNTER — Encounter (HOSPITAL_BASED_OUTPATIENT_CLINIC_OR_DEPARTMENT_OTHER): Payer: Self-pay

## 2018-04-25 ENCOUNTER — Emergency Department (HOSPITAL_BASED_OUTPATIENT_CLINIC_OR_DEPARTMENT_OTHER)
Admission: EM | Admit: 2018-04-25 | Discharge: 2018-04-25 | Disposition: A | Discharge status: Home / Self Care | Payer: PPO | Attending: Emergency Medicine | Admitting: Emergency Medicine

## 2018-04-25 ENCOUNTER — Emergency Department (HOSPITAL_BASED_OUTPATIENT_CLINIC_OR_DEPARTMENT_OTHER): Payer: PPO

## 2018-04-25 DIAGNOSIS — I809 Phlebitis and thrombophlebitis of unspecified site: Secondary | ICD-10-CM | POA: Insufficient documentation

## 2018-04-25 DIAGNOSIS — I5042 Chronic combined systolic (congestive) and diastolic (congestive) heart failure: Secondary | ICD-10-CM | POA: Diagnosis not present

## 2018-04-25 DIAGNOSIS — M79605 Pain in left leg: Secondary | ICD-10-CM | POA: Diagnosis not present

## 2018-04-25 DIAGNOSIS — Z7982 Long term (current) use of aspirin: Secondary | ICD-10-CM | POA: Diagnosis not present

## 2018-04-25 DIAGNOSIS — Z79899 Other long term (current) drug therapy: Secondary | ICD-10-CM | POA: Insufficient documentation

## 2018-04-25 DIAGNOSIS — F17200 Nicotine dependence, unspecified, uncomplicated: Secondary | ICD-10-CM | POA: Diagnosis not present

## 2018-04-25 DIAGNOSIS — I110 Hypertensive heart disease with heart failure: Secondary | ICD-10-CM | POA: Diagnosis not present

## 2018-04-25 DIAGNOSIS — Z950 Presence of cardiac pacemaker: Secondary | ICD-10-CM | POA: Diagnosis not present

## 2018-04-25 DIAGNOSIS — M79662 Pain in left lower leg: Secondary | ICD-10-CM | POA: Diagnosis not present

## 2018-04-25 MED ORDER — HYDROCODONE-ACETAMINOPHEN 5-325 MG PO TABS: 1.0000 | ORAL_TABLET | ORAL | 0 refills | Status: DC | PRN

## 2018-04-25 NOTE — ED Notes (Signed)
Patient transported to Ultrasound 

## 2018-04-25 NOTE — ED Triage Notes (Signed)
C/o pain to left LE x 3 days-denies injury-NAD

## 2018-04-25 NOTE — Progress Notes (Signed)
HPI: FU cardiomyopathy/CHF. Echocardiogram in August of 2013 showed an ejection fraction of 35-40%. Cardiac catheterization in September of 2013 showed mild nonobstructive coronary disease. There was no hemodynamic evidence of restriction. Pulmonary capillary wedge pressure 10. Cardiac MRI in September of 2013 showed an ejection fraction of 34% with diffuse hypokinesis. There was no hyperenhancement or scar tissue and no evidence of cardiac hemochromatosis. TSH in September 2013 normal. 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 Patient subsequently had CRTP placed. Nuclear study March 2017 showed ejection fraction 32. There was prior inferior infarct but no ischemia. Treated medically as felt likely attenuation artifact. Echocardiogram April 2018 showed ejection fraction 16-10%, grade 1 diastolic dysfunction and moderate left atrial enlargement.Since last seen,he has dyspnea on exertion unchanged.  No orthopnea, PND or pedal edema.  No exertional chest pain or syncope.  Current Outpatient Medications  Medication Sig Dispense Refill  . aspirin 81 MG tablet Take 81 mg by mouth daily.    . carvedilol (COREG) 12.5 MG tablet TAKE ONE TABLET BY MOUTH TWO TIMES A DAY WITH A MEAL 180 tablet 2  . citalopram (CELEXA) 10 MG tablet Take 5 mg by mouth daily.     [name] ENTRESTO 24-26 MG TAKE ONE TABLET BY MOUTH TWICE A DAY 180 tablet 2  . furosemide (LASIX) 20 MG tablet TAKE ONE TABLET BY MOUTH DAILY 60 tablet 10  . HYDROcodone-acetaminophen (NORCO/VICODIN) 5-325 MG tablet Take 1 tablet by mouth every 4 (four) hours as needed. 10 tablet 0  . hydroxyurea (HYDREA) 500 MG capsule TAKE ONE TABLET EVERY OTHER DAY AND 2 TABLETS THEN OTHER DAYS 135 capsule 2  . mesalamine (APRISO) 0.375 g 24 hr capsule Take 375 mg by mouth daily.     . Methotrexate Sodium (METHOTREXATE, PF,) 250 MG/10ML injection Inject 6 mg into the vein once a week.    [name] omeprazole (PRILOSEC) 20 MG capsule Take 20 mg by mouth 2  (two) times daily before a meal.     . Probiotic Product (ALIGN PO) Take 1 capsule by mouth daily.     [name] Spacer/Aero-Holding Chambers (AEROCHAMBER MV) inhaler Use as instructed 1 each 0  . spironolactone (ALDACTONE) 25 MG tablet TAKE ONE-HALF TABLET BY MOUTH DAILY 45 tablet 2  . tamsulosin (FLOMAX) 0.4 MG CAPS capsule Take 0.4 mg by mouth.    . Tiotropium Bromide-Olodaterol (STIOLTO RESPIMAT) 2.5-2.5 MCG/ACT AERS Inhale 2 puffs into the lungs daily. 1 Inhaler 11   No current facility-administered medications for this visit.    Facility-Administered Medications Ordered in Other Visits  Medication Dose Route Frequency Provider Last Rate Last Dose  . 0.9 %  sodium chloride infusion   Intravenous Continuous Volanda Napoleon, MD   Stopped at 05/16/13 1115     Past Medical History:  Diagnosis Date  . AV block, 2nd degree 2015   St. Jude Allure Quadra pulse generator [id number], model PM 3242  . Back pain   . Cardiomyopathy ([place])    Nonischemic 45%.   . CHF (congestive heart failure) ([place])   . Gout   . Hemochromatosis   . Hypertension   . Hypospadias 06/06/1950   born with  . Nephrolithiasis   . Polycythemia vera(238.4)     Past Surgical History:  Procedure Laterality Date  . ANKLE SURGERY    . BI-VENTRICULAR PACEMAKER INSERTION N/A 04/29/2014   Procedure: BI-VENTRICULAR PACEMAKER INSERTION (CRT-P);  Surgeon: Deboraha Sprang, MD; Laterality: Left  St. Jude Allure Quadra pulse generator  [id number], model PM 3242  . PILONIDAL CYST EXCISION    . POSTERIOR LAMINECTOMY / DECOMPRESSION LUMBAR SPINE    . TONSILLECTOMY      Social History   Socioeconomic History  . Marital status: Married    Spouse name: Not on file  . Number of children: Not on file  . Years of education: Not on file  . Highest education level: Not on file  Occupational History  . Not on file  Social Needs  . Financial resource strain: Not on file  . Food insecurity:    Worry: Not on file    Inability: Not on file    . Transportation needs:    Medical: Not on file    Non-medical: Not on file  Tobacco Use  . Smoking status: Former Smoker    Packs/day: 1.00    Years: 44.00    Pack years: 44.00    Types: Cigarettes    Start date: 06/05/1968    Last attempt to quit: 04/05/2013    Years since quitting: 5.0  . Smokeless tobacco: Never Used  . Tobacco comment: quit 2 years ago  Substance and Sexual Activity  . Alcohol use: Yes    Alcohol/week: 0.0 standard drinks    Comment: rare  . Drug use: No  . Sexual activity: Not on file  Lifestyle  . Physical activity:    Days per week: Not on file    Minutes per session: Not on file  . Stress: Not on file  Relationships  . Social connections:    Talks on phone: Not on file    Gets together: Not on file    Attends religious service: Not on file    Active member of club or organization: Not on file    Attends meetings of clubs or organizations: Not on file    Relationship status: Not on file  . Intimate partner violence:    Fear of current or ex partner: Not on file    Emotionally abused: Not on file    Physically abused: Not on file    Forced sexual activity: Not on file  Other Topics Concern  . Not on file  Social History Narrative   Lives at home with wife in a one story home.  Has no children.  Does not work.  Getting workman's comp.  Education: 4 years trade school.     Family History  Problem Relation Age of Onset  . Heart disease Maternal Grandmother        Pacemaker, MI  . Stroke Mother   . Other Father        Deceased, car fell on him  . Diabetes Sister   . Hypertension Sister     ROS: back pain no fevers or chills, productive cough, hemoptysis, dysphasia, odynophagia, melena, hematochezia, dysuria, hematuria, rash, seizure activity, orthopnea, PND, pedal edema, claudication. Remaining systems are negative.  Physical Exam: Well-developed obese in no acute distress.  Skin is warm and dry.  HEENT is normal.  Neck is supple.   Chest is clear to auscultation with normal expansion.  Cardiovascular exam is regular rate and rhythm.  Abdominal exam nontender or distended. No masses palpated. Extremities show no edema. neuro grossly intact  A/P  1 Nonischemic cardiomyopathy-plan to continue medical therapy with Entresto and Coreg.  Repeat echocardiogram.  2 chronic combined systolic/diastolic congestive heart failure-patient appears to be euvolemic today on examination.  Continue present dose of diuretic.  We discussed the importance of fluid restriction and  low-sodium diet.  I will obtained most recent laboratories including potassium and renal function from his rheumatologist.  3 morbid obesity-we discussed the importance of diet, exercise and weight loss.  4 hypertension-patient's blood pressure is controlled.  Continue present medications and follow.  5 CRT-P-followed by electrophysiology.  Kirk Ruths, MD

## 2018-04-25 NOTE — ED Provider Notes (Signed)
[facility] EMERGENCY DEPARTMENT Provider Note   CSN: [id number] Arrival date & time: 04/25/18  1644     History   Chief Complaint Chief Complaint  Patient presents with  . Leg Pain    HPI William Villanueva is a 67 y.o. male.  Pt presents to the ED today with LLE pain.  He has had this pain for about 1 week.  He denies any injury.  No more sob than usual.  No cp.     Past Medical History:  Diagnosis Date  . AV block, 2nd degree 2015   St. Jude Allure Quadra pulse generator [id number], model PM 3242  . Back pain   . Cardiomyopathy ([place])    Nonischemic 45%.   . CHF (congestive heart failure) ([place])   . Gout   . Hemochromatosis   . Hypertension   . Hypospadias [birth year]   born with  . Nephrolithiasis   . Polycythemia vera(238.4)     Patient Active Problem List   Diagnosis Date Noted  . CHF (congestive heart failure) ([place]) 07/17/2017  . COPD (chronic obstructive pulmonary disease) ([place]) 06/14/2017  . Acute renal failure (ARF) ([place]) 05/27/2016  . Hyponatremia 05/27/2016  . Hypokalemia 05/27/2016  . AKI (acute kidney injury) ([place]) 05/26/2016  . Depression 04/05/2016  . Kidney stones 04/05/2016  . Osteoarthritis 04/05/2016  . Sleep apnea 04/05/2016  . Bilateral carpal tunnel syndrome 12/02/2015  . Chronic combined systolic and diastolic congestive heart failure ([place]) 04/13/2015  . Orthostatic hypotension 01/19/2015  . Obesity 11/26/2014  . Enlarged prostate without lower urinary tract symptoms (luts) 10/29/2014  . Essential hypertension 10/29/2014  . GERD (gastroesophageal reflux disease) 10/29/2014  . Panic attack 10/29/2014  . Changing skin lesion 10/16/2014  . Acute on chronic combined systolic and diastolic congestive heart failure ([place]) 10/14/2014  . Renal cyst 09/02/2014  . Pacemaker lead malfunction-elevated threshold RV lead 07/31/2014  . Dyspnea on exertion 06/21/2014  . Noninfective gastroenteritis and colitis 06/15/2014  . Pacemaker-CRT  06/11/2014  . Palpitations 06/11/2014  . 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  . Other cardiomyopathies ([place]) 04/29/2014  . Bradycardia 04/28/2014  . Chronic pain 02/12/2014  . 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 Muscular wasting and disuse atrophy 08/15/2013  . Lumbar and sacral osteoarthritis 05/19/2013  . Congestive dilated cardiomyopathy ([place]) 01/11/2012  . Compulsive tobacco user syndrome 12/13/2011  . Current tobacco use 12/13/2011  . Chest tightness   . Rash   . Back pain   . Hemochromatosis   . SOB (shortness of breath)   . Gout   . Polycythemia vera ([place]) 03/27/2011    Past Surgical History:  Procedure Laterality Date  . ANKLE SURGERY    . BI-VENTRICULAR PACEMAKER INSERTION N/A 04/29/2014   Procedure: BI-VENTRICULAR PACEMAKER INSERTION (CRT-P);  Surgeon: Deboraha Sprang, MD; Laterality: Left  St. Jude Allure Quadra pulse generator [id number], model PM [phone]  . PILONIDAL CYST EXCISION    . POSTERIOR LAMINECTOMY / DECOMPRESSION LUMBAR SPINE    . TONSILLECTOMY          Home Medications    Prior to Admission medications   Medication Sig Start Date End Date Taking? Authorizing Provider  aspirin 81 MG tablet Take 81 mg by mouth daily.    [provider]  carvedilol (COREG) 12.5 MG tablet TAKE ONE TABLET BY MOUTH TWO TIMES A DAY WITH A MEAL 12/31/17   Lelon Perla, MD  citalopram (CELEXA) 10 MG tablet Take 5 mg by mouth daily.  01/27/16  [provider]  ENTRESTO 24-26 MG TAKE ONE TABLET BY MOUTH TWICE A DAY 12/31/17   Lelon Perla, MD  furosemide (LASIX) 20 MG tablet TAKE ONE TABLET BY MOUTH DAILY 07/13/17   Lelon Perla, MD  HYDROcodone-acetaminophen (NORCO/VICODIN) 5-325 MG tablet Take 1 tablet by mouth every 4 (four) hours as needed. 04/25/18   Isla Pence, MD  hydroxyurea (HYDREA) 500 MG capsule TAKE ONE TABLET EVERY OTHER DAY AND 2 TABLETS THEN OTHER DAYS 08/01/17   Volanda Napoleon, MD  mesalamine (APRISO) 0.375 g 24 hr  capsule Take 375 mg by mouth daily.     [provider]  Methotrexate Sodium (METHOTREXATE, PF,) 250 MG/10ML injection Inject 6 mg into the vein once a week.    [provider]  omeprazole (PRILOSEC) 20 MG capsule Take 20 mg by mouth 2 (two) times daily before a meal.  07/05/16   [provider]  Probiotic Product (ALIGN PO) Take 1 capsule by mouth daily.     [provider]  Spacer/Aero-Holding Chambers (AEROCHAMBER MV) inhaler Use as instructed 01/09/18   Collene Gobble, MD  spironolactone (ALDACTONE) 25 MG tablet TAKE ONE-HALF TABLET BY MOUTH DAILY 01/04/18   Lelon Perla, MD  tamsulosin (FLOMAX) 0.4 MG CAPS capsule Take 0.4 mg by mouth. 08/03/17   [provider]  Tiotropium Bromide-Olodaterol (STIOLTO RESPIMAT) 2.5-2.5 MCG/ACT AERS Inhale 2 puffs into the lungs daily. 01/09/18   Collene Gobble, MD    Family History Family History  Problem Relation Age of Onset  . Heart disease Maternal Grandmother        Pacemaker, MI  . Stroke Mother   . Other Father        Deceased, car fell on him  . Diabetes Sister   . Hypertension Sister     Social History Social History   Tobacco Use  . Smoking status: Former Smoker    Packs/day: 1.00    Years: 44.00    Pack years: 44.00    Types: Cigarettes    Start date: 06/05/1968    Last attempt to quit: 04/05/2013    Years since quitting: 5.0  . Smokeless tobacco: Never Used  . Tobacco comment: quit 2 years ago  Substance Use Topics  . Alcohol use: Yes    Alcohol/week: 0.0 standard drinks    Comment: rare  . Drug use: No     Allergies   Bupropion; Fluoxetine; Ibuprofen; Prednisone; Temazepam; Trazodone and nefazodone; Cephalexin; Fluoxetine hcl; and Prozac [fluoxetine hcl]   Review of Systems Review of Systems  Musculoskeletal:       LLE pain  All other systems reviewed and are negative.    Physical Exam Updated Vital Signs BP 121/68 (BP Location: Right Arm)   Pulse 82   Temp 98.2  F (36.8 C) (Oral)   Resp 20   Ht 5' 10"  (1.778 m)   Wt (!) 138.8 kg   SpO2 99%   BMI 43.91 kg/m   Physical Exam Vitals signs and nursing note reviewed.  Constitutional:      Appearance: Normal appearance. He is obese.  HENT:     Head: Normocephalic and atraumatic.     Right Ear: External ear normal.     Left Ear: External ear normal.     Nose: Nose normal.     Mouth/Throat:     Mouth: Mucous membranes are moist.  Eyes:     Extraocular Movements: Extraocular movements intact.     Pupils: Pupils  are equal, round, and reactive to light.  Neck:     Musculoskeletal: Normal range of motion.  Cardiovascular:     Rate and Rhythm: Normal rate and regular rhythm.     Pulses: Normal pulses.     Heart sounds: Normal heart sounds.  Pulmonary:     Effort: Pulmonary effort is normal.     Breath sounds: Normal breath sounds.  Abdominal:     General: Abdomen is flat.  Musculoskeletal: Normal range of motion.     Comments: LLE swelling  Skin:    General: Skin is warm.     Capillary Refill: Capillary refill takes less than 2 seconds.  Neurological:     General: No focal deficit present.     Mental Status: He is alert and oriented to person, place, and time.  Psychiatric:        Mood and Affect: Mood normal.        Behavior: Behavior normal.        Thought Content: Thought content normal.        Judgment: Judgment normal.      ED Treatments / Results  Labs (all labs ordered are listed, but only abnormal results are displayed) Labs Reviewed - No data to display  EKG None  Radiology US Venous Img Lower Unilateral Left  Result Date: 04/25/2018 CLINICAL DATA:  LEFT leg pain for 3 days EXAM: LEFT LOWER EXTREMITY VENOUS DOPPLER ULTRASOUND TECHNIQUE: Gray-scale sonography with graded compression, as well as color Doppler and duplex ultrasound were performed to evaluate the lower extremity deep venous systems from the level of the common femoral vein and including the common  femoral, femoral, profunda femoral, popliteal and calf veins including the posterior tibial, peroneal and gastrocnemius veins when visible. The superficial great saphenous vein was also interrogated. Spectral Doppler was utilized to evaluate flow at rest and with distal augmentation maneuvers in the common femoral, femoral and popliteal veins. COMPARISON:  None FINDINGS: Contralateral Common Femoral Vein: Respiratory phasicity is normal and symmetric with the symptomatic side. No evidence of thrombus. Normal compressibility. Common Femoral Vein: No evidence of thrombus. Normal compressibility, respiratory phasicity and response to augmentation. Saphenofemoral Junction: No evidence of thrombus. Normal compressibility and flow on color Doppler imaging. Profunda Femoral Vein: No evidence of thrombus. Normal compressibility and flow on color Doppler imaging. Femoral Vein: No evidence of thrombus. Normal compressibility, respiratory phasicity and response to augmentation. Popliteal Vein: No evidence of thrombus. Normal compressibility, respiratory phasicity and response to augmentation. Calf Veins: No evidence of thrombus. Normal compressibility and flow on color Doppler imaging. Superficial Great Saphenous Vein: No evidence of thrombus. Normal compressibility. Venous Reflux:  None. Other Findings:  None. IMPRESSION: No evidence of deep venous thrombosis in the LEFT lower extremity. Electronically Signed   By: Lavonia Dana M.D.   On: 04/25/2018 17:44    Procedures Procedures (including critical care time)  Medications Ordered in ED Medications - No data to display   Initial Impression / Assessment and Plan / ED Course  I have reviewed the triage vital signs and the nursing notes.  Pertinent labs & imaging results that were available during my care of the patient were reviewed by me and considered in my medical decision making (see chart for details).    No evidence of DVT.  I will refer to vascular as an  outpatient in case his leg continues to hurt.  Return if worse.  Final Clinical Impressions(s) / ED Diagnoses   Final diagnoses:  Left leg  pain  Phlebitis    ED Discharge Orders         Ordered    HYDROcodone-acetaminophen (NORCO/VICODIN) 5-325 MG tablet  Every 4 hours PRN     04/25/18 1757           Isla Pence, MD 04/25/18 1758

## 2018-05-02 DIAGNOSIS — M0579 Rheumatoid arthritis with rheumatoid factor of multiple sites without organ or systems involvement: Secondary | ICD-10-CM | POA: Diagnosis not present

## 2018-05-07 ENCOUNTER — Encounter: Payer: Self-pay | Admitting: Cardiology

## 2018-05-07 ENCOUNTER — Ambulatory Visit (INDEPENDENT_AMBULATORY_CARE_PROVIDER_SITE_OTHER): Payer: PPO | Admitting: Cardiology

## 2018-05-07 VITALS — BP 120/74 | HR 81 | Ht 70.0 in | Wt 308.0 lb

## 2018-05-07 DIAGNOSIS — I5042 Chronic combined systolic (congestive) and diastolic (congestive) heart failure: Secondary | ICD-10-CM

## 2018-05-07 DIAGNOSIS — I10 Essential (primary) hypertension: Secondary | ICD-10-CM

## 2018-05-07 DIAGNOSIS — I428 Other cardiomyopathies: Secondary | ICD-10-CM

## 2018-05-07 NOTE — Patient Instructions (Signed)
Medication Instructions:  NO CHANGE If you need a refill on your cardiac medications before your next appointment, please call your pharmacy.   Lab work: If you have labs (blood work) drawn today and your tests are completely normal, you will receive your results only by: [name] MyChart Message (if you have MyChart) OR . A paper copy in the mail If you have any lab test that is abnormal or we need to change your treatment, we will call you to review the results.  Testing/Procedures: Your physician has requested that you have an echocardiogram. Echocardiography is a painless test that uses sound waves to create images of your heart. It provides your doctor with information about the size and shape of your heart and how well your heart's chambers and valves are working. This procedure takes approximately one hour. There are no restrictions for this procedure.    Follow-Up: At [facility], you and your health needs are our priority.  As part of our continuing mission to provide you with exceptional heart care, we have created designated Provider Care Teams.  These Care Teams include your primary Cardiologist (physician) and Advanced Practice Providers (APPs -  Physician Assistants and Nurse Practitioners) who all work together to provide you with the care you need, when you need it. You will need a follow up appointment in 6 months.  Please call our office 2 months in advance to schedule this appointment.  You may see Kirk Ruths MD or one of the following Advanced Practice Providers on your designated Care Team:   Kerin Ransom, PA-C Roby Lofts, [place] . Sande Rives, PA-C  CALL IN [facility] FOR AN APPOINTMENT IN [place]

## 2018-05-15 ENCOUNTER — Other Ambulatory Visit: Payer: Self-pay

## 2018-05-15 ENCOUNTER — Ambulatory Visit (HOSPITAL_COMMUNITY): Payer: PPO | Attending: Cardiology

## 2018-05-15 DIAGNOSIS — I428 Other cardiomyopathies: Secondary | ICD-10-CM | POA: Diagnosis not present

## 2018-05-21 LAB — CUP PACEART REMOTE DEVICE CHECK
Battery Remaining Longevity: 88 mo
Battery Remaining Percentage: 95.5 %
Battery Voltage: 2.96 V
Brady Statistic AP VP Percent: 11 %
Brady Statistic AP VS Percent: 1 %
Brady Statistic AS VP Percent: 87 %
Brady Statistic AS VS Percent: 1 %
Brady Statistic RA Percent Paced: 9.2 %
Date Time Interrogation Session: 20191114012123
Implantable Lead Implant Date: 20151223
Implantable Lead Implant Date: 20151223
Implantable Lead Implant Date: 20151223
Implantable Lead Location: 753858
Implantable Lead Location: 753859
Implantable Lead Location: 753860
Implantable Pulse Generator Implant Date: 20151223
Lead Channel Impedance Value: 440 Ohm
Lead Channel Impedance Value: 450 Ohm
Lead Channel Impedance Value: 730 Ohm
Lead Channel Pacing Threshold Amplitude: 0.75 V
Lead Channel Pacing Threshold Amplitude: 1 V
Lead Channel Pacing Threshold Amplitude: 1.25 V
Lead Channel Pacing Threshold Pulse Width: 0.4 ms
Lead Channel Pacing Threshold Pulse Width: 0.4 ms
Lead Channel Pacing Threshold Pulse Width: 0.7 ms
Lead Channel Sensing Intrinsic Amplitude: 10.1 mV
Lead Channel Sensing Intrinsic Amplitude: 5 mV
Lead Channel Setting Pacing Amplitude: 1.75 V
Lead Channel Setting Pacing Amplitude: 2 V
Lead Channel Setting Pacing Amplitude: 2.25 V
Lead Channel Setting Pacing Pulse Width: 0.4 ms
Lead Channel Setting Pacing Pulse Width: 0.7 ms
Lead Channel Setting Sensing Sensitivity: 2 mV
Pulse Gen Model: 3242
Pulse Gen Serial Number: 7701275

## 2018-05-23 ENCOUNTER — Telehealth: Payer: Self-pay | Admitting: Cardiology

## 2018-05-23 NOTE — Telephone Encounter (Signed)
New Message          Patient  Is calling for his results on his echo,  Patient is also asking about some paper work and medication.Pls call and advise

## 2018-05-23 NOTE — Telephone Encounter (Signed)
SPOKE TO PATIENT - ECHO RESUT GIVEN.  1-PATIENT ALSO WANTED TO KNOW IF  LAB RESULTS WERE RECEIVED - IT WAS QUESTION IF  FUROSEMIDE COULD BE REDUCED? 2- PATIENT WANTED TO KNOW IF DEBRA RECEIVED  PATIENT ASSISTANCE FORM FOR ENTRESTO , THAT HE LEFT AT THE OFFICE EARLIER.   PATIENT AWARE WILL FORWARD TO HER AND HE WILL RECEIVE CALL BACK WITH ANSWERS. PATIENT VERBALIZED UNDERSTANDING.

## 2018-05-23 NOTE — Telephone Encounter (Signed)
Left message for pt to call.

## 2018-05-27 NOTE — Telephone Encounter (Signed)
Follow up    Pt returning call regarding lab results

## 2018-05-27 NOTE — Telephone Encounter (Signed)
Spoke with pt, aware the lab work was reviewed by dr Stanford Breed and he was not making any changes to his medications. Aware of echo results. He took the [employer] paperwork to the [place] office. Aware I am in [place] Wednesday and will check on the paperwork. He reports his blood doctor was concerned because the patient was taking 3 fluid pills. Medications confirmed and fluid restrictions discussed with wife and patient. The patient reports he goes to the bathroom all the time and that is why the blood doctor was concerned.

## 2018-05-31 DIAGNOSIS — Z6841 Body Mass Index (BMI) 40.0 and over, adult: Secondary | ICD-10-CM | POA: Diagnosis not present

## 2018-05-31 DIAGNOSIS — M7918 Myalgia, other site: Secondary | ICD-10-CM | POA: Diagnosis not present

## 2018-05-31 DIAGNOSIS — M47812 Spondylosis without myelopathy or radiculopathy, cervical region: Secondary | ICD-10-CM | POA: Diagnosis not present

## 2018-05-31 NOTE — Telephone Encounter (Signed)
Spoke with pt, the paperwork was not at the [facility] office. I will mail him another copy to fill out and he will mail it back to me.

## 2018-06-12 ENCOUNTER — Other Ambulatory Visit: Payer: Self-pay | Admitting: Gastroenterology

## 2018-06-12 DIAGNOSIS — R1013 Epigastric pain: Secondary | ICD-10-CM

## 2018-06-12 DIAGNOSIS — K219 Gastro-esophageal reflux disease without esophagitis: Secondary | ICD-10-CM | POA: Diagnosis not present

## 2018-06-12 DIAGNOSIS — K529 Noninfective gastroenteritis and colitis, unspecified: Secondary | ICD-10-CM | POA: Diagnosis not present

## 2018-06-13 ENCOUNTER — Emergency Department (HOSPITAL_BASED_OUTPATIENT_CLINIC_OR_DEPARTMENT_OTHER)
Admission: EM | Admit: 2018-06-13 | Discharge: 2018-06-13 | Disposition: A | Discharge status: Home / Self Care | Payer: PPO | Attending: Emergency Medicine | Admitting: Emergency Medicine

## 2018-06-13 ENCOUNTER — Encounter (HOSPITAL_BASED_OUTPATIENT_CLINIC_OR_DEPARTMENT_OTHER): Payer: Self-pay | Admitting: *Deleted

## 2018-06-13 ENCOUNTER — Other Ambulatory Visit: Payer: Self-pay

## 2018-06-13 DIAGNOSIS — J449 Chronic obstructive pulmonary disease, unspecified: Secondary | ICD-10-CM | POA: Insufficient documentation

## 2018-06-13 DIAGNOSIS — Z79899 Other long term (current) drug therapy: Secondary | ICD-10-CM | POA: Diagnosis not present

## 2018-06-13 DIAGNOSIS — E1165 Type 2 diabetes mellitus with hyperglycemia: Secondary | ICD-10-CM | POA: Diagnosis not present

## 2018-06-13 DIAGNOSIS — Z950 Presence of cardiac pacemaker: Secondary | ICD-10-CM | POA: Insufficient documentation

## 2018-06-13 DIAGNOSIS — E860 Dehydration: Secondary | ICD-10-CM | POA: Diagnosis not present

## 2018-06-13 DIAGNOSIS — R739 Hyperglycemia, unspecified: Secondary | ICD-10-CM

## 2018-06-13 DIAGNOSIS — I5042 Chronic combined systolic (congestive) and diastolic (congestive) heart failure: Secondary | ICD-10-CM | POA: Diagnosis not present

## 2018-06-13 DIAGNOSIS — I110 Hypertensive heart disease with heart failure: Secondary | ICD-10-CM | POA: Diagnosis not present

## 2018-06-13 DIAGNOSIS — Z87891 Personal history of nicotine dependence: Secondary | ICD-10-CM | POA: Insufficient documentation

## 2018-06-13 LAB — CBC WITH DIFFERENTIAL/PLATELET
Abs Immature Granulocytes: 0.14 10*3/uL — ABNORMAL HIGH (ref 0.00–0.07)
Basophils Absolute: 0.2 10*3/uL — ABNORMAL HIGH (ref 0.0–0.1)
Basophils Relative: 2 %
Eosinophils Absolute: 0.1 10*3/uL (ref 0.0–0.5)
Eosinophils Relative: 1 %
HCT: 49.4 % (ref 39.0–52.0)
Hemoglobin: 15.1 g/dL (ref 13.0–17.0)
Immature Granulocytes: 1 %
Lymphocytes Relative: 7 %
Lymphs Abs: 1 10*3/uL (ref 0.7–4.0)
MCH: 29.6 pg (ref 26.0–34.0)
MCHC: 30.6 g/dL (ref 30.0–36.0)
MCV: 96.9 fL (ref 80.0–100.0)
Monocytes Absolute: 0.7 10*3/uL (ref 0.1–1.0)
Monocytes Relative: 5 %
Neutro Abs: 11.8 10*3/uL — ABNORMAL HIGH (ref 1.7–7.7)
Neutrophils Relative %: 84 %
Platelets: 521 10*3/uL — ABNORMAL HIGH (ref 150–400)
RBC: 5.1 MIL/uL (ref 4.22–5.81)
RDW: 16.2 % — ABNORMAL HIGH (ref 11.5–15.5)
WBC: 14 10*3/uL — ABNORMAL HIGH (ref 4.0–10.5)
nRBC: 0 % (ref 0.0–0.2)

## 2018-06-13 LAB — BASIC METABOLIC PANEL
Anion gap: 10 (ref 5–15)
BUN: 30 mg/dL — ABNORMAL HIGH (ref 8–23)
CO2: 21 mmol/L — ABNORMAL LOW (ref 22–32)
Calcium: 9 mg/dL (ref 8.9–10.3)
Chloride: 103 mmol/L (ref 98–111)
Creatinine, Ser: 1.53 mg/dL — ABNORMAL HIGH (ref 0.61–1.24)
GFR calc Af Amer: 54 mL/min — ABNORMAL LOW (ref >60)
GFR calc non Af Amer: 46 mL/min — ABNORMAL LOW (ref >60)
Glucose, Bld: 356 mg/dL — ABNORMAL HIGH (ref 70–99)
Potassium: 4.2 mmol/L (ref 3.5–5.1)
Sodium: 134 mmol/L — ABNORMAL LOW (ref 135–145)

## 2018-06-13 LAB — CBG MONITORING, ED
Glucose-Capillary: 366 mg/dL — ABNORMAL HIGH (ref 70–99)
Glucose-Capillary: 322 mg/dL — ABNORMAL HIGH (ref 70–99)

## 2018-06-13 MED ORDER — SODIUM CHLORIDE 0.9 % IV BOLUS
1000.0000 mL | Freq: Once | INTRAVENOUS | Status: AC
  Administered 2018-06-13: 1000 mL via INTRAVENOUS

## 2018-06-13 NOTE — ED Notes (Signed)
Pt on monitor 

## 2018-06-13 NOTE — ED Provider Notes (Signed)
[facility] EMERGENCY DEPARTMENT Provider Note   CSN: [id number] Arrival date & time: 06/13/18  1408     History   Chief Complaint Chief Complaint  Patient presents with  . Hyperglycemia    HPI William Villanueva is a 68 y.o. male.  68 year old male presents for high blood sugar.  Patient states that he was seen at his stomach doctor yesterday who did blood work and called him last night and told him his blood sugar was high and he should go to the emergency room.  Patient opted not to come to the emergency room last night, contacted his PCP who advised him to come to the ER today.  Patient is scheduled to see his PCP tomorrow, does not currently take medication for diabetes.  Patient ports prior history of elevated blood sugar, was treated for a brief time and then no longer required medication.  Patient states that he does not regularly check his blood sugar however has been checking it today and it has ranged from 300s to 500s.  Patient has had 1 bottle of water today, 1 egg sandwich and a chicken wing.  Patient does not feel excessively thirsty, states he is very hungry however reports if he eats small amounts he feels full and does not have much of an appetite.  Patient reports feeling dizzy at times, not currently.  No other complaints or concerns.     Past Medical History:  Diagnosis Date  . AV block, 2nd degree 2015   [employer] Allure Quadra pulse generator [id number], model PM 3242  . Back pain   . Cardiomyopathy ([place])    Nonischemic 45%.   . CHF (congestive heart failure) ([place])   . Gout   . Hemochromatosis   . Hypertension   . Hypospadias 15-Sep-1950   born with  . Nephrolithiasis   . Polycythemia vera(238.4)     Patient Active Problem List   Diagnosis Date Noted  . CHF (congestive heart failure) ([place]) 07/17/2017  . COPD (chronic obstructive pulmonary disease) ([place]) 06/14/2017  . Acute renal failure (ARF) ([place]) 05/27/2016  . Hyponatremia 05/27/2016  .  Hypokalemia 05/27/2016  . AKI (acute kidney injury) ([place]) 05/26/2016  . Depression 04/05/2016  . Kidney stones 04/05/2016  . Osteoarthritis 04/05/2016  . Sleep apnea 04/05/2016  . Bilateral carpal tunnel syndrome 12/02/2015  . Chronic combined systolic and diastolic congestive heart failure ([place]) 04/13/2015  . Orthostatic hypotension 01/19/2015  . Obesity 11/26/2014  . Enlarged prostate without lower urinary tract symptoms ([place]) 10/29/2014  . Essential hypertension 10/29/2014  . GERD (gastroesophageal reflux disease) 10/29/2014  . Panic attack 10/29/2014  . Changing skin lesion 10/16/2014  . Acute on chronic combined systolic and diastolic congestive heart failure ([place]) 10/14/2014  . Renal cyst 09/02/2014  . Pacemaker lead malfunction-elevated threshold RV lead 07/31/2014  . Dyspnea on exertion 06/21/2014  . Noninfective gastroenteritis and colitis 06/15/2014  . 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 Palpitations 06/11/2014  . 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  . Other cardiomyopathies ([place]) 04/29/2014  . Bradycardia 04/28/2014  . Chronic pain 02/12/2014  . 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 Muscular wasting and disuse atrophy 08/15/2013  . Lumbar and sacral osteoarthritis 05/19/2013  . Congestive dilated cardiomyopathy ([place]) 01/11/2012  . Compulsive tobacco user syndrome 12/13/2011  . Current tobacco use 12/13/2011  . Chest tightness   . Rash   . Back pain   . Hemochromatosis   . SOB (shortness of breath)   . Gout   . Polycythemia vera (  [place]) 03/27/2011    Past Surgical History:  Procedure Laterality Date  . ANKLE SURGERY    . BI-VENTRICULAR PACEMAKER INSERTION N/A 04/29/2014   Procedure: BI-VENTRICULAR PACEMAKER INSERTION (CRT-P);  Surgeon: Deboraha Sprang, MD; Laterality: Left  St. Jude Allure Quadra pulse generator [id number], model PM [phone]  . PILONIDAL CYST EXCISION    . POSTERIOR LAMINECTOMY / DECOMPRESSION LUMBAR SPINE    . TONSILLECTOMY          Home  Medications    Prior to Admission medications   Medication Sig Start Date End Date Taking? Authorizing Provider  aspirin 81 MG tablet Take 81 mg by mouth daily.   Yes [provider]  carvedilol (COREG) 12.5 MG tablet TAKE ONE TABLET BY MOUTH TWO TIMES A DAY WITH A MEAL 12/31/17  Yes Crenshaw, Denice Bors, MD  citalopram (CELEXA) 10 MG tablet Take 5 mg by mouth daily.  01/27/16  Yes [provider]  ENTRESTO 24-26 MG TAKE ONE TABLET BY MOUTH TWICE A DAY 12/31/17  Yes Lelon Perla, MD  febuxostat (ULORIC) 40 MG tablet Take 80 mg by mouth daily.   Yes [provider]  folic acid (FOLVITE) 1 MG tablet Take 1 mg by mouth daily.   Yes [provider]  furosemide (LASIX) 20 MG tablet TAKE ONE TABLET BY MOUTH DAILY 07/13/17  Yes Crenshaw, Denice Bors, MD  hydroxyurea (HYDREA) 500 MG capsule TAKE ONE TABLET EVERY OTHER DAY AND 2 TABLETS THEN OTHER DAYS 08/01/17  Yes Volanda Napoleon, MD  mesalamine (APRISO) 0.375 g 24 hr capsule Take 375 mg by mouth daily.    Yes [provider]  omeprazole (PRILOSEC) 20 MG capsule Take 20 mg by mouth 2 (two) times daily before a meal.  07/05/16  Yes [provider]  spironolactone (ALDACTONE) 25 MG tablet TAKE ONE-HALF TABLET BY MOUTH DAILY 01/04/18  Yes Lelon Perla, MD  tamsulosin (FLOMAX) 0.4 MG CAPS capsule Take 0.4 mg by mouth. 08/03/17  Yes [provider]  HYDROcodone-acetaminophen (NORCO/VICODIN) 5-325 MG tablet Take 1 tablet by mouth every 4 (four) hours as needed. 04/25/18   Isla Pence, MD  Methotrexate Sodium (METHOTREXATE, PF,) 250 MG/10ML injection Inject 6 mg into the vein once a week.    [provider]  Probiotic Product (ALIGN PO) Take 1 capsule by mouth daily.     [provider]  Spacer/Aero-Holding Chambers (AEROCHAMBER MV) inhaler Use as instructed 01/09/18   Collene Gobble, MD  Tiotropium Bromide-Olodaterol (STIOLTO RESPIMAT) 2.5-2.5 MCG/ACT AERS Inhale 2 puffs into the  lungs daily. 01/09/18   Collene Gobble, MD    Family History Family History  Problem Relation Age of Onset  . Heart disease Maternal Grandmother        Pacemaker, MI  . Stroke Mother   . Other Father        Deceased, car fell on him  . Diabetes Sister   . Hypertension Sister     Social History Social History   Tobacco Use  . Smoking status: Former Smoker    Packs/day: 1.00    Years: 44.00    Pack years: 44.00    Types: Cigarettes    Start date: 06/05/1968    Last attempt to quit: 04/05/2013    Years since quitting: 5.1  . Smokeless tobacco: Never Used  . Tobacco comment: quit 2 years ago  Substance Use Topics  . Alcohol use: Yes    Alcohol/week: 0.0 standard drinks    Comment:  rare  . Drug use: No     Allergies   Bupropion; Fluoxetine; Ibuprofen; Prednisone; Temazepam; Trazodone and nefazodone; Cephalexin; Fluoxetine hcl; and Prozac [fluoxetine hcl]   Review of Systems Review of Systems  Constitutional: Negative for chills and fever.  Respiratory: Negative for shortness of breath.   Cardiovascular: Negative for chest pain.  Gastrointestinal: Negative for abdominal pain, constipation, diarrhea, nausea and vomiting.  Endocrine: Negative for polydipsia, polyphagia and polyuria.  Genitourinary: Negative for decreased urine volume and difficulty urinating.  Musculoskeletal: Negative for arthralgias and myalgias.  Skin: Negative for rash and wound.  Allergic/Immunologic: Positive for immunocompromised state.  Neurological: Positive for dizziness.  Psychiatric/Behavioral: Negative for confusion.  All other systems reviewed and are negative.    Physical Exam Updated Vital Signs BP 97/74   Pulse 71   Temp 98.2 F (36.8 C) (Oral)   Resp 16   Ht 5\' 10"  (1.778 m)   Wt 132 kg   SpO2 95%   BMI 41.75 kg/m   Physical Exam Vitals signs and nursing note reviewed.  Constitutional:      General: He is not in acute distress.    Appearance: He is well-developed. He  is obese. He is not diaphoretic.  HENT:     Head: Normocephalic and atraumatic.     Nose: Nose normal.     Mouth/Throat:     Mouth: Mucous membranes are moist.  Eyes:     Conjunctiva/sclera: Conjunctivae normal.  Cardiovascular:     Rate and Rhythm: Normal rate and regular rhythm.     Pulses: Normal pulses.     Heart sounds: Normal heart sounds. No murmur.  Pulmonary:     Effort: Pulmonary effort is normal.     Breath sounds: Normal breath sounds.  Abdominal:     Tenderness: There is no abdominal tenderness.  Skin:    General: Skin is warm and dry.  Neurological:     Mental Status: He is alert and oriented to person, place, and time.  Psychiatric:        Behavior: Behavior normal.      ED Treatments / Results  Labs (all labs ordered are listed, but only abnormal results are displayed) Labs Reviewed  BASIC METABOLIC PANEL - Abnormal; Notable for the following components:      Result Value   Sodium 134 (*)    CO2 21 (*)    Glucose, Bld 356 (*)    BUN 30 (*)    Creatinine, Ser 1.53 (*)    GFR calc non Af Amer 46 (*)    GFR calc Af Amer 54 (*)    All other components within normal limits  CBC WITH DIFFERENTIAL/PLATELET - Abnormal; Notable for the following components:   WBC 14.0 (*)    RDW 16.2 (*)    Platelets 521 (*)    Neutro Abs 11.8 (*)    Basophils Absolute 0.2 (*)    Abs Immature Granulocytes 0.14 (*)    All other components within normal limits  CBG MONITORING, ED - Abnormal; Notable for the following components:   Glucose-Capillary 366 (*)    All other components within normal limits  CBG MONITORING, ED - Abnormal; Notable for the following components:   Glucose-Capillary 322 (*)    All other components within normal limits    EKG None  Radiology No results found.  Procedures Procedures (including critical care time)  Medications Ordered in ED Medications  sodium chloride 0.9 % bolus 1,000 mL (1,000 mLs Intravenous New  Bag/Given 06/13/18 1441)      Initial Impression / Assessment and Plan / ED Course  I have reviewed the triage vital signs and the nursing notes.  Pertinent labs & imaging results that were available during my care of the patient were reviewed by me and considered in my medical decision making (see chart for details).  Clinical Course as of Jun 13 1532  Thu Jun 14, 1359  4132 68 year old male presents with elevated blood sugar.  Patient states that his GI doctor drew blood yesterday and called him last night told his blood sugar was elevated.  Patient is scheduled to see his PCP tomorrow, has been monitoring his blood sugar today and it has ranged from 300-500.  Patient denies feeling unwell or different from recent days, states he was told to come to the emergency room today because his blood sugar was high. CBC WBC 14K, BMP mild hyponatremia, Co2 21, Glucose 356, mildly dehydrated. Patient given 1L NS (After 537mL CBG 322). Discussed importance of diet and exercise with adequate water intake with patient. Patient is scheduled to see his PCP tomorrow, discussed starting Metformin today vs waiting to see PCP for plan, patient prefers to see PCP before starting any new medicine. Patient has a glucometer at home to monitor his blood sugar, given information on diet/exercise/BS monitoring. Return to ER if needed.   [LM]    Clinical Course User Index [LM] Tacy Learn, PA-C   Final Clinical Impressions(s) / ED Diagnoses   Final diagnoses:  Hyperglycemia    ED Discharge Orders    None       Tacy Learn, PA-C 06/13/18 1534    Long, [name], MD 06/14/18 1517

## 2018-06-13 NOTE — ED Notes (Signed)
ED Provider at bedside. 

## 2018-06-13 NOTE — Discharge Instructions (Addendum)
Drink plenty of water to help lower your blood sugar. Review diet and exercise information.  Follow up with your doctor tomorrow as scheduled. Return to ER for worsening or concerning symptoms.

## 2018-06-13 NOTE — ED Triage Notes (Signed)
CBG checked this morning = 544

## 2018-06-14 DIAGNOSIS — F321 Major depressive disorder, single episode, moderate: Secondary | ICD-10-CM | POA: Diagnosis not present

## 2018-06-14 DIAGNOSIS — I5043 Acute on chronic combined systolic (congestive) and diastolic (congestive) heart failure: Secondary | ICD-10-CM | POA: Diagnosis not present

## 2018-06-14 DIAGNOSIS — M109 Gout, unspecified: Secondary | ICD-10-CM | POA: Diagnosis not present

## 2018-06-14 DIAGNOSIS — B356 Tinea cruris: Secondary | ICD-10-CM | POA: Diagnosis not present

## 2018-06-14 DIAGNOSIS — E1122 Type 2 diabetes mellitus with diabetic chronic kidney disease: Secondary | ICD-10-CM | POA: Diagnosis not present

## 2018-06-14 DIAGNOSIS — M069 Rheumatoid arthritis, unspecified: Secondary | ICD-10-CM | POA: Diagnosis not present

## 2018-06-14 DIAGNOSIS — Z794 Long term (current) use of insulin: Secondary | ICD-10-CM | POA: Diagnosis not present

## 2018-06-14 DIAGNOSIS — K219 Gastro-esophageal reflux disease without esophagitis: Secondary | ICD-10-CM | POA: Diagnosis not present

## 2018-06-14 DIAGNOSIS — K529 Noninfective gastroenteritis and colitis, unspecified: Secondary | ICD-10-CM | POA: Diagnosis not present

## 2018-06-14 DIAGNOSIS — M47812 Spondylosis without myelopathy or radiculopathy, cervical region: Secondary | ICD-10-CM | POA: Diagnosis not present

## 2018-06-14 DIAGNOSIS — D45 Polycythemia vera: Secondary | ICD-10-CM | POA: Diagnosis not present

## 2018-06-18 DIAGNOSIS — M545 Low back pain: Secondary | ICD-10-CM | POA: Diagnosis not present

## 2018-06-18 DIAGNOSIS — M1A09X0 Idiopathic chronic gout, multiple sites, without tophus (tophi): Secondary | ICD-10-CM | POA: Diagnosis not present

## 2018-06-18 DIAGNOSIS — M0579 Rheumatoid arthritis with rheumatoid factor of multiple sites without organ or systems involvement: Secondary | ICD-10-CM | POA: Diagnosis not present

## 2018-06-18 DIAGNOSIS — Z79899 Other long term (current) drug therapy: Secondary | ICD-10-CM | POA: Diagnosis not present

## 2018-06-18 DIAGNOSIS — Z6841 Body Mass Index (BMI) 40.0 and over, adult: Secondary | ICD-10-CM | POA: Diagnosis not present

## 2018-06-18 DIAGNOSIS — M2550 Pain in unspecified joint: Secondary | ICD-10-CM | POA: Diagnosis not present

## 2018-06-20 ENCOUNTER — Ambulatory Visit (INDEPENDENT_AMBULATORY_CARE_PROVIDER_SITE_OTHER): Payer: PPO

## 2018-06-20 DIAGNOSIS — I442 Atrioventricular block, complete: Secondary | ICD-10-CM | POA: Diagnosis not present

## 2018-06-21 LAB — CUP PACEART REMOTE DEVICE CHECK
Battery Remaining Longevity: 89 mo
Battery Remaining Percentage: 95.5 %
Battery Voltage: 2.96 V
Brady Statistic AP VP Percent: 12 %
Brady Statistic AP VS Percent: 1 %
Brady Statistic AS VP Percent: 86 %
Brady Statistic AS VS Percent: 1 %
Brady Statistic RA Percent Paced: 9.8 %
Date Time Interrogation Session: 20200212070014
Implantable Lead Implant Date: 20151223
Implantable Lead Implant Date: 20151223
Implantable Lead Implant Date: 20151223
Implantable Lead Location: 753858
Implantable Lead Location: 753859
Implantable Lead Location: 753860
Implantable Pulse Generator Implant Date: 20151223
Lead Channel Impedance Value: 450 Ohm
Lead Channel Impedance Value: 480 Ohm
Lead Channel Impedance Value: 790 Ohm
Lead Channel Pacing Threshold Amplitude: 1 V
Lead Channel Pacing Threshold Amplitude: 1.125 V
Lead Channel Pacing Threshold Amplitude: 1.25 V
Lead Channel Pacing Threshold Pulse Width: 0.4 ms
Lead Channel Pacing Threshold Pulse Width: 0.4 ms
Lead Channel Pacing Threshold Pulse Width: 0.7 ms
Lead Channel Sensing Intrinsic Amplitude: 10.2 mV
Lead Channel Sensing Intrinsic Amplitude: 3.4 mV
Lead Channel Setting Pacing Amplitude: 2 V
Lead Channel Setting Pacing Amplitude: 2.125
Lead Channel Setting Pacing Amplitude: 2.25 V
Lead Channel Setting Pacing Pulse Width: 0.4 ms
Lead Channel Setting Pacing Pulse Width: 0.7 ms
Lead Channel Setting Sensing Sensitivity: 2 mV
Pulse Gen Model: 3242
Pulse Gen Serial Number: 7701275

## 2018-06-25 ENCOUNTER — Inpatient Hospital Stay: Payer: PPO | Attending: Hematology & Oncology | Admitting: Hematology & Oncology

## 2018-06-25 ENCOUNTER — Other Ambulatory Visit: Payer: Self-pay

## 2018-06-25 ENCOUNTER — Inpatient Hospital Stay: Payer: PPO

## 2018-06-25 VITALS — BP 106/58 | HR 78 | Temp 97.8°F | Resp 20 | Wt 288.5 lb

## 2018-06-25 DIAGNOSIS — Z7982 Long term (current) use of aspirin: Secondary | ICD-10-CM | POA: Insufficient documentation

## 2018-06-25 DIAGNOSIS — E119 Type 2 diabetes mellitus without complications: Secondary | ICD-10-CM | POA: Diagnosis not present

## 2018-06-25 DIAGNOSIS — Z794 Long term (current) use of insulin: Secondary | ICD-10-CM | POA: Diagnosis not present

## 2018-06-25 DIAGNOSIS — D45 Polycythemia vera: Secondary | ICD-10-CM

## 2018-06-25 DIAGNOSIS — I441 Atrioventricular block, second degree: Secondary | ICD-10-CM

## 2018-06-25 LAB — CBC WITH DIFFERENTIAL (CANCER CENTER ONLY)
Abs Immature Granulocytes: 0.09 10*3/uL — ABNORMAL HIGH (ref 0.00–0.07)
Basophils Absolute: 0.2 10*3/uL — ABNORMAL HIGH (ref 0.0–0.1)
Basophils Relative: 2 %
Eosinophils Absolute: 0.1 10*3/uL (ref 0.0–0.5)
Eosinophils Relative: 1 %
HCT: 47.6 % (ref 39.0–52.0)
Hemoglobin: 15.1 g/dL (ref 13.0–17.0)
Immature Granulocytes: 1 %
Lymphocytes Relative: 12 %
Lymphs Abs: 1.2 10*3/uL (ref 0.7–4.0)
MCH: 31.1 pg (ref 26.0–34.0)
MCHC: 31.7 g/dL (ref 30.0–36.0)
MCV: 98.1 fL (ref 80.0–100.0)
Monocytes Absolute: 0.8 10*3/uL (ref 0.1–1.0)
Monocytes Relative: 8 %
Neutro Abs: 7.5 10*3/uL (ref 1.7–7.7)
Neutrophils Relative %: 76 %
Platelet Count: 634 10*3/uL — ABNORMAL HIGH (ref 150–400)
RBC: 4.85 MIL/uL (ref 4.22–5.81)
RDW: 17.3 % — ABNORMAL HIGH (ref 11.5–15.5)
WBC Count: 10.1 10*3/uL (ref 4.0–10.5)
nRBC: 0 % (ref 0.0–0.2)

## 2018-06-25 LAB — CMP (CANCER CENTER ONLY)
ALT: 21 U/L (ref 0–44)
AST: 21 U/L (ref 15–41)
Albumin: 4.3 g/dL (ref 3.5–5.0)
Alkaline Phosphatase: 73 U/L (ref 38–126)
Anion gap: 11 (ref 5–15)
BUN: 18 mg/dL (ref 8–23)
CO2: 20 mmol/L — ABNORMAL LOW (ref 22–32)
Calcium: 9.1 mg/dL (ref 8.9–10.3)
Chloride: 109 mmol/L (ref 98–111)
Creatinine: 1.37 mg/dL — ABNORMAL HIGH (ref 0.61–1.24)
GFR, Est AFR Am: >60 mL/min (ref >60)
GFR, Estimated: 53 mL/min — ABNORMAL LOW (ref >60)
Glucose, Bld: 159 mg/dL — ABNORMAL HIGH (ref 70–99)
Potassium: 4 mmol/L (ref 3.5–5.1)
Sodium: 140 mmol/L (ref 135–145)
Total Bilirubin: 0.6 mg/dL (ref 0.3–1.2)
Total Protein: 6.8 g/dL (ref 6.5–8.1)

## 2018-06-25 LAB — FERRITIN: Ferritin: 25 ng/mL (ref 24–336)

## 2018-06-25 LAB — IRON AND TIBC
Iron: 52 ug/dL (ref 42–163)
Saturation Ratios: 13 % — ABNORMAL LOW (ref 20–55)
TIBC: 399 ug/dL (ref 202–409)
UIBC: 347 ug/dL (ref 117–376)

## 2018-06-25 LAB — LACTATE DEHYDROGENASE: LDH: 273 U/L — ABNORMAL HIGH (ref 98–192)

## 2018-06-25 NOTE — Progress Notes (Signed)
Hematology and Oncology Follow Up Visit  Cledis Sohn [id number] [birth year] 68 y.o. 06/25/2018   Principle Diagnosis:   Polycythemia vera-JAK2 positive  Heart block-Mobitz II  Current Therapy:    Hydrea 1000 mg p.o.daily -dose changed on 06/25/2018  Aspirin 81 mg by mouth daily  Phlebotomy to maintain hematocrit below 45%     Interim History:  Mr.  Winningham is back for followup.  He is now on insulin.  He is diabetic.  His doctor went straight to insulin and not try to go with oral medications.  I think this was a very good idea.  He feels okay.  He is losing weight.  I think weight loss deftly will be quite helpful for him.  He probably is lost almost 30 pounds.  We have not phlebotomize him for quite a while.  His hemoglobin and hematocrit are up a little bit today.  His platelet count is also up quite a bit.  I am surprised by this.  I am not sure what changes have occurred outside of him being on insulin.  I will increase his Hydrea to 1000 mg a day.  He has had no bleeding.  There has been no change in bowel or bladder habits.  He has had no leg swelling.  There has been no rashes.  Overall, his performance status is ECOG 1.    Medications:  Current Outpatient Medications:  .  aspirin 81 MG tablet, Take 81 mg by mouth daily., Disp: , Rfl:  .  BD PEN NEEDLE NANO U/F 32G X 4 MM MISC, , Disp: , Rfl:  .  carvedilol (COREG) 12.5 MG tablet, TAKE ONE TABLET BY MOUTH TWO TIMES A DAY WITH A MEAL, Disp: 180 tablet, Rfl: 2 .  citalopram (CELEXA) 10 MG tablet, Take 5 mg by mouth daily. , Disp: , Rfl:  .  ENTRESTO 24-26 MG, TAKE ONE TABLET BY MOUTH TWICE A DAY, Disp: 180 tablet, Rfl: 2 .  febuxostat (ULORIC) 40 MG tablet, Take 80 mg by mouth daily., Disp: , Rfl:  .  folic acid (FOLVITE) 1 MG tablet, Take 1 mg by mouth daily., Disp: , Rfl:  .  furosemide (LASIX) 20 MG tablet, TAKE ONE TABLET BY MOUTH DAILY, Disp: 60 tablet, Rfl: 10 .  HYDROcodone-acetaminophen (NORCO/VICODIN) 5-325  MG tablet, Take 1 tablet by mouth every 4 (four) hours as needed., Disp: 10 tablet, Rfl: 0 .  hydroxyurea (HYDREA) 500 MG capsule, TAKE ONE TABLET EVERY OTHER DAY AND 2 TABLETS THEN OTHER DAYS, Disp: 135 capsule, Rfl: 2 .  mesalamine (APRISO) 0.375 g 24 hr capsule, Take 375 mg by mouth daily. , Disp: , Rfl:  .  Methotrexate Sodium (METHOTREXATE, PF,) 250 MG/10ML injection, Inject 6 mg into the vein once a week., Disp: , Rfl:  .  omeprazole (PRILOSEC) 20 MG capsule, Take 20 mg by mouth 2 (two) times daily before a meal. , Disp: , Rfl:  .  Probiotic Product (ALIGN PO), Take 1 capsule by mouth daily. , Disp: , Rfl:  .  Spacer/Aero-Holding Chambers (AEROCHAMBER MV) inhaler, Use as instructed, Disp: 1 each, Rfl: 0 .  spironolactone (ALDACTONE) 25 MG tablet, TAKE ONE-HALF TABLET BY MOUTH DAILY, Disp: 45 tablet, Rfl: 2 .  tamsulosin (FLOMAX) 0.4 MG CAPS capsule, Take 0.4 mg by mouth., Disp: , Rfl:  .  Tiotropium Bromide-Olodaterol (STIOLTO RESPIMAT) 2.5-2.5 MCG/ACT AERS, Inhale 2 puffs into the lungs daily., Disp: 1 Inhaler, Rfl: 11 No current facility-administered medications for this visit.   Facility-Administered  Medications Ordered in Other Visits:  .  0.9 %  sodium chloride infusion, , Intravenous, Continuous, Ennever, Rudell Cobb, MD, Stopped at 05/16/13 1115  Allergies:  Allergies  Allergen Reactions  . Bupropion Hives  . Fluoxetine Itching  . Ibuprofen Itching  . Prednisone Itching and Other (See Comments)    Abdominal pain Other reaction(s): Abdominal Pain   . Temazepam Other (See Comments)    Dizziness   . Trazodone And Nefazodone Other (See Comments)    Dizziness  . Cephalexin Diarrhea    Caused C-diff Caused C-diff   . Fluoxetine Hcl Itching  . Prozac [Fluoxetine Hcl] Itching    Past Medical History, Surgical history, Social history, and Family History were reviewed and updated.  Review of Systems: Review of Systems  Constitutional: Negative.   HENT: Negative.   Eyes:  Negative.   Respiratory: Positive for shortness of breath.   Cardiovascular: Positive for chest pain.  Gastrointestinal: Negative.   Genitourinary: Negative.   Musculoskeletal: Positive for back pain.  Skin: Negative.   Neurological: Negative.   Endo/Heme/Allergies: Negative.   Psychiatric/Behavioral: Negative.     Physical Exam:  weight is 288 lb 8 oz (130.9 kg). His oral temperature is 97.8 F (36.6 C). His blood pressure is 106/58 (abnormal) and his pulse is 78. His respiration is 20 and oxygen saturation is 97%.   Physical Exam Vitals signs reviewed.  HENT:     Head: Normocephalic and atraumatic.  Eyes:     Pupils: Pupils are equal, round, and reactive to light.  Neck:     Musculoskeletal: Normal range of motion.  Cardiovascular:     Rate and Rhythm: Normal rate and regular rhythm.     Heart sounds: Normal heart sounds.  Pulmonary:     Effort: Pulmonary effort is normal.     Breath sounds: Normal breath sounds.  Abdominal:     General: Bowel sounds are normal.     Palpations: Abdomen is soft.  Musculoskeletal: Normal range of motion.        General: No tenderness or deformity.  Lymphadenopathy:     Cervical: No cervical adenopathy.  Skin:    General: Skin is warm and dry.     Findings: No erythema or rash.  Neurological:     Mental Status: He is alert and oriented to person, place, and time.  Psychiatric:        Behavior: Behavior normal.        Thought Content: Thought content normal.        Judgment: Judgment normal.      Lab Results  Component Value Date   WBC 10.1 06/25/2018   HGB 15.1 06/25/2018   HCT 47.6 06/25/2018   MCV 98.1 06/25/2018   PLT 634 (H) 06/25/2018     Chemistry      Component Value Date/Time   NA 134 (L) 06/13/2018 1430   NA 141 04/10/2017 0923   NA 140 04/05/2016 0739   K 4.2 06/13/2018 1430   K 3.5 04/10/2017 0923   K 4.2 04/05/2016 0739   CL 103 06/13/2018 1430   CL 108 04/10/2017 0923   CO2 21 (L) 06/13/2018 1430   CO2  24 04/10/2017 0923   CO2 20 (L) 04/05/2016 0739   BUN 30 (H) 06/13/2018 1430   BUN 12 04/10/2017 0923   BUN 14.7 04/05/2016 0739   CREATININE 1.53 (H) 06/13/2018 1430   CREATININE 1.27 (H) 03/26/2018 0758   CREATININE 1.1 04/10/2017 0923   CREATININE 1.1 04/05/2016  9798      Component Value Date/Time   CALCIUM 9.0 06/13/2018 1430   CALCIUM 8.4 04/10/2017 0923   CALCIUM 9.0 04/05/2016 0739   ALKPHOS 82 03/26/2018 0758   ALKPHOS 77 04/10/2017 0923   ALKPHOS 91 04/05/2016 0739   AST 42 (H) 03/26/2018 0758   AST 34 04/05/2016 0739   ALT 35 03/26/2018 0758   ALT 32 04/10/2017 0923   ALT 40 04/05/2016 0739   BILITOT 0.6 03/26/2018 0758   BILITOT 0.48 04/05/2016 0739      Impression and Plan: Mr. Kissner is 68 year old gentleman with polycythemia vera.  Again, I am not sure why his platelet count is up a little bit.  He has been very well controlled for such a long time.  We will increase his Hydrea 2000 mg daily.  We will get him back in 1 month.  I really need to make sure that we follow him closely.  I will not phlebotomize him as of yet.  He does have a tough time with phlebotomies.  If we find that the increase in Hydrea dose is not controlling his hemoglobin, then we will phlebotomize him.      Volanda Napoleon, MD 2/18/20208:37 AM

## 2018-07-01 ENCOUNTER — Ambulatory Visit
Admission: RE | Admit: 2018-07-01 | Discharge: 2018-07-01 | Disposition: A | Discharge status: Home / Self Care | Payer: PPO | Source: Ambulatory Visit | Attending: Gastroenterology | Admitting: Gastroenterology

## 2018-07-01 DIAGNOSIS — N281 Cyst of kidney, acquired: Secondary | ICD-10-CM | POA: Diagnosis not present

## 2018-07-01 DIAGNOSIS — R1013 Epigastric pain: Secondary | ICD-10-CM

## 2018-07-02 NOTE — Progress Notes (Signed)
Remote pacemaker transmission.   

## 2018-07-08 ENCOUNTER — Other Ambulatory Visit: Payer: Self-pay | Admitting: Hematology & Oncology

## 2018-07-08 DIAGNOSIS — D45 Polycythemia vera: Secondary | ICD-10-CM

## 2018-07-19 DIAGNOSIS — R350 Frequency of micturition: Secondary | ICD-10-CM | POA: Diagnosis not present

## 2018-07-19 DIAGNOSIS — K219 Gastro-esophageal reflux disease without esophagitis: Secondary | ICD-10-CM | POA: Diagnosis not present

## 2018-07-19 DIAGNOSIS — M109 Gout, unspecified: Secondary | ICD-10-CM | POA: Diagnosis not present

## 2018-07-19 DIAGNOSIS — M069 Rheumatoid arthritis, unspecified: Secondary | ICD-10-CM | POA: Diagnosis not present

## 2018-07-19 DIAGNOSIS — E1122 Type 2 diabetes mellitus with diabetic chronic kidney disease: Secondary | ICD-10-CM | POA: Diagnosis not present

## 2018-07-19 DIAGNOSIS — F321 Major depressive disorder, single episode, moderate: Secondary | ICD-10-CM | POA: Diagnosis not present

## 2018-07-19 DIAGNOSIS — I5043 Acute on chronic combined systolic (congestive) and diastolic (congestive) heart failure: Secondary | ICD-10-CM | POA: Diagnosis not present

## 2018-07-19 DIAGNOSIS — Z0000 Encounter for general adult medical examination without abnormal findings: Secondary | ICD-10-CM | POA: Diagnosis not present

## 2018-07-19 DIAGNOSIS — K529 Noninfective gastroenteritis and colitis, unspecified: Secondary | ICD-10-CM | POA: Diagnosis not present

## 2018-07-19 DIAGNOSIS — M47812 Spondylosis without myelopathy or radiculopathy, cervical region: Secondary | ICD-10-CM | POA: Diagnosis not present

## 2018-07-19 DIAGNOSIS — D45 Polycythemia vera: Secondary | ICD-10-CM | POA: Diagnosis not present

## 2018-07-26 DIAGNOSIS — M47812 Spondylosis without myelopathy or radiculopathy, cervical region: Secondary | ICD-10-CM | POA: Diagnosis not present

## 2018-07-26 DIAGNOSIS — M7918 Myalgia, other site: Secondary | ICD-10-CM | POA: Diagnosis not present

## 2018-07-29 ENCOUNTER — Other Ambulatory Visit: Payer: Self-pay | Admitting: Cardiology

## 2018-07-29 DIAGNOSIS — R0602 Shortness of breath: Secondary | ICD-10-CM

## 2018-07-30 ENCOUNTER — Inpatient Hospital Stay: Payer: PPO | Attending: Hematology & Oncology | Admitting: Hematology & Oncology

## 2018-07-30 ENCOUNTER — Inpatient Hospital Stay: Payer: PPO

## 2018-07-30 ENCOUNTER — Other Ambulatory Visit: Payer: Self-pay

## 2018-07-30 VITALS — BP 116/66 | HR 80 | Temp 97.7°F | Resp 19 | Wt 291.5 lb

## 2018-07-30 DIAGNOSIS — D45 Polycythemia vera: Secondary | ICD-10-CM | POA: Diagnosis not present

## 2018-07-30 DIAGNOSIS — Z950 Presence of cardiac pacemaker: Secondary | ICD-10-CM | POA: Diagnosis not present

## 2018-07-30 DIAGNOSIS — Z7982 Long term (current) use of aspirin: Secondary | ICD-10-CM | POA: Insufficient documentation

## 2018-07-30 DIAGNOSIS — I441 Atrioventricular block, second degree: Secondary | ICD-10-CM | POA: Insufficient documentation

## 2018-07-30 LAB — CBC WITH DIFFERENTIAL (CANCER CENTER ONLY)
Abs Immature Granulocytes: 0.23 10*3/uL — ABNORMAL HIGH (ref 0.00–0.07)
Basophils Absolute: 0.2 10*3/uL — ABNORMAL HIGH (ref 0.0–0.1)
Basophils Relative: 2 %
Eosinophils Absolute: 0.1 10*3/uL (ref 0.0–0.5)
Eosinophils Relative: 2 %
HCT: 44.7 % (ref 39.0–52.0)
Hemoglobin: 14.2 g/dL (ref 13.0–17.0)
Immature Granulocytes: 3 %
Lymphocytes Relative: 12 %
Lymphs Abs: 1.1 10*3/uL (ref 0.7–4.0)
MCH: 31.8 pg (ref 26.0–34.0)
MCHC: 31.8 g/dL (ref 30.0–36.0)
MCV: 100 fL (ref 80.0–100.0)
Monocytes Absolute: 0.8 10*3/uL (ref 0.1–1.0)
Monocytes Relative: 9 %
Neutro Abs: 6.5 10*3/uL (ref 1.7–7.7)
Neutrophils Relative %: 72 %
Platelet Count: 269 10*3/uL (ref 150–400)
RBC: 4.47 MIL/uL (ref 4.22–5.81)
RDW: 16.7 % — ABNORMAL HIGH (ref 11.5–15.5)
WBC Count: 8.9 10*3/uL (ref 4.0–10.5)
nRBC: 0 % (ref 0.0–0.2)

## 2018-07-30 LAB — CMP (CANCER CENTER ONLY)
ALT: 27 U/L (ref 0–44)
AST: 26 U/L (ref 15–41)
Albumin: 4.2 g/dL (ref 3.5–5.0)
Alkaline Phosphatase: 59 U/L (ref 38–126)
Anion gap: 10 (ref 5–15)
BUN: 13 mg/dL (ref 8–23)
CO2: 24 mmol/L (ref 22–32)
Calcium: 8.1 mg/dL — ABNORMAL LOW (ref 8.9–10.3)
Chloride: 105 mmol/L (ref 98–111)
Creatinine: 1.05 mg/dL (ref 0.61–1.24)
GFR, Est AFR Am: >60 mL/min (ref >60)
GFR, Estimated: >60 mL/min (ref >60)
Glucose, Bld: 169 mg/dL — ABNORMAL HIGH (ref 70–99)
Potassium: 3.6 mmol/L (ref 3.5–5.1)
Sodium: 139 mmol/L (ref 135–145)
Total Bilirubin: 0.7 mg/dL (ref 0.3–1.2)
Total Protein: 6.5 g/dL (ref 6.5–8.1)

## 2018-07-30 LAB — SAVE SMEAR(SSMR), FOR PROVIDER SLIDE REVIEW

## 2018-07-30 LAB — IRON AND TIBC
Iron: 43 ug/dL (ref 42–163)
Saturation Ratios: 11 % — ABNORMAL LOW (ref 20–55)
TIBC: 409 ug/dL (ref 202–409)
UIBC: 365 ug/dL (ref 117–376)

## 2018-07-30 LAB — FERRITIN: Ferritin: 16 ng/mL — ABNORMAL LOW (ref 24–336)

## 2018-07-30 NOTE — Progress Notes (Signed)
Hematology and Oncology Follow Up Visit  William Villanueva [id number] 1950-11-22 68 y.o. 07/30/2018   Principle Diagnosis:   Polycythemia vera-JAK2 positive  Heart block-Mobitz II  Current Therapy:    Hydrea 1000 mg p.o.daily -dose changed on 06/25/2018  Aspirin 81 mg by mouth daily  Phlebotomy to maintain hematocrit below 45%     Interim History:  [name] is back for followup.  He is doing well.  He is managing the coronavirus.  He and his wife are just not going anywhere.  They are still taking care of of a great grandson.  They are expecting a second great grandchild in October.  He has had no problems with his heart.  He has a pacemaker in place.  He has had no issues with bowels or bladder.  He has chronic back issues.  These will always be with him.  He has had no issues with rashes.  We increase his Hydrea dose last time we saw him.  This is worked quite nicely as his platelet count is come down wonderfully.  Overall, his performance status is ECOG 1.    Medications:  Current Outpatient Medications:  .  aspirin 81 MG tablet, Take 81 mg by mouth daily., Disp: , Rfl:  .  BD PEN NEEDLE NANO U/F 32G X 4 MM MISC, , Disp: , Rfl:  .  carvedilol (COREG) 12.5 MG tablet, TAKE ONE TABLET BY MOUTH TWO TIMES A DAY WITH A MEAL, Disp: 180 tablet, Rfl: 2 .  citalopram (CELEXA) 10 MG tablet, Take 5 mg by mouth daily. , Disp: , Rfl:  .  ENTRESTO 24-26 MG, TAKE ONE TABLET BY MOUTH TWICE A DAY, Disp: 180 tablet, Rfl: 2 .  febuxostat (ULORIC) 40 MG tablet, Take 80 mg by mouth daily., Disp: , Rfl:  .  folic acid (FOLVITE) 1 MG tablet, Take 1 mg by mouth daily., Disp: , Rfl:  .  furosemide (LASIX) 20 MG tablet, TAKE ONE TABLET BY MOUTH DAILY, Disp: 60 tablet, Rfl: 8 .  HYDROcodone-acetaminophen (NORCO/VICODIN) 5-325 MG tablet, Take 1 tablet by mouth every 4 (four) hours as needed., Disp: 10 tablet, Rfl: 0 .  hydroxyurea (HYDREA) 500 MG capsule, TAKE ONE TABLET EVERY OTHER DAY AND 2  TABLETS THEN OTHER DAYS, Disp: 135 capsule, Rfl: 1 .  mesalamine (APRISO) 0.375 g 24 hr capsule, Take 375 mg by mouth daily. , Disp: , Rfl:  .  Methotrexate Sodium (METHOTREXATE, PF,) 250 MG/10ML injection, Inject 6 mg into the vein once a week., Disp: , Rfl:  .  omeprazole (PRILOSEC) 20 MG capsule, Take 20 mg by mouth 2 (two) times daily before a meal. , Disp: , Rfl:  .  Probiotic Product (ALIGN PO), Take 1 capsule by mouth daily. , Disp: , Rfl:  .  Spacer/Aero-Holding Chambers (AEROCHAMBER MV) inhaler, Use as instructed, Disp: 1 each, Rfl: 0 .  spironolactone (ALDACTONE) 25 MG tablet, TAKE ONE-HALF TABLET BY MOUTH DAILY, Disp: 45 tablet, Rfl: 2 .  tamsulosin (FLOMAX) 0.4 MG CAPS capsule, Take 0.4 mg by mouth., Disp: , Rfl:  .  Tiotropium Bromide-Olodaterol (STIOLTO RESPIMAT) 2.5-2.5 MCG/ACT AERS, Inhale 2 puffs into the lungs daily., Disp: 1 Inhaler, Rfl: 11 No current facility-administered medications for this visit.   Facility-Administered Medications Ordered in Other Visits:  .  0.9 %  sodium chloride infusion, , Intravenous, Continuous, , Rudell Cobb, MD, Stopped at 05/16/13 1115  Allergies:  Allergies  Allergen Reactions  . Bupropion Hives  . Fluoxetine Itching  .  Ibuprofen Itching  . Prednisone Itching and Other (See Comments)    Abdominal pain Other reaction(s): Abdominal Pain   . Temazepam Other (See Comments)    Dizziness   . Trazodone And Nefazodone Other (See Comments)    Dizziness  . Cephalexin Diarrhea    Caused C-diff Caused C-diff   . Fluoxetine Hcl Itching  . Prozac [Fluoxetine Hcl] Itching    Past Medical History, Surgical history, Social history, and Family History were reviewed and updated.  Review of Systems: Review of Systems  Constitutional: Negative.   HENT: Negative.   Eyes: Negative.   Respiratory: Positive for shortness of breath.   Cardiovascular: Positive for chest pain.  Gastrointestinal: Negative.   Genitourinary: Negative.    Musculoskeletal: Positive for back pain.  Skin: Negative.   Neurological: Negative.   Endo/Heme/Allergies: Negative.   Psychiatric/Behavioral: Negative.     Physical Exam:  weight is 291 lb 8 oz (132.2 kg). His oral temperature is 97.7 F (36.5 C). His blood pressure is 116/66 and his pulse is 80. His respiration is 19 and oxygen saturation is 97%.   Physical Exam Vitals signs reviewed.  HENT:     Head: Normocephalic and atraumatic.  Eyes:     Pupils: Pupils are equal, round, and reactive to light.  Neck:     Musculoskeletal: Normal range of motion.  Cardiovascular:     Rate and Rhythm: Normal rate and regular rhythm.     Heart sounds: Normal heart sounds.  Pulmonary:     Effort: Pulmonary effort is normal.     Breath sounds: Normal breath sounds.  Abdominal:     General: Bowel sounds are normal.     Palpations: Abdomen is soft.  Musculoskeletal: Normal range of motion.        General: No tenderness or deformity.  Lymphadenopathy:     Cervical: No cervical adenopathy.  Skin:    General: Skin is warm and dry.     Findings: No erythema or rash.  Neurological:     Mental Status: He is alert and oriented to person, place, and time.  Psychiatric:        Behavior: Behavior normal.        Thought Content: Thought content normal.        Judgment: Judgment normal.      Lab Results  Component Value Date   WBC 8.9 07/30/2018   HGB 14.2 07/30/2018   HCT 44.7 07/30/2018   MCV 100.0 07/30/2018   PLT 269 07/30/2018     Chemistry      Component Value Date/Time   NA 140 06/25/2018 0757   NA 141 04/10/2017 0923   NA 140 04/05/2016 0739   K 4.0 06/25/2018 0757   K 3.5 04/10/2017 0923   K 4.2 04/05/2016 0739   CL 109 06/25/2018 0757   CL 108 04/10/2017 0923   CO2 20 (L) 06/25/2018 0757   CO2 24 04/10/2017 0923   CO2 20 (L) 04/05/2016 0739   BUN 18 06/25/2018 0757   BUN 12 04/10/2017 0923   BUN 14.7 04/05/2016 0739   CREATININE 1.37 (H) 06/25/2018 0757   CREATININE  1.1 04/10/2017 0923   CREATININE 1.1 04/05/2016 0739      Component Value Date/Time   CALCIUM 9.1 06/25/2018 0757   CALCIUM 8.4 04/10/2017 0923   CALCIUM 9.0 04/05/2016 0739   ALKPHOS 73 06/25/2018 0757   ALKPHOS 77 04/10/2017 0923   ALKPHOS 91 04/05/2016 0739   AST 21 06/25/2018 0757  AST 34 04/05/2016 0739   ALT 21 06/25/2018 0757   ALT 32 04/10/2017 0923   ALT 40 04/05/2016 0739   BILITOT 0.6 06/25/2018 0757   BILITOT 0.48 04/05/2016 0739      Impression and Plan: Mr. Riese is 68 year old gentleman with polycythemia vera.  He does not need to be phlebotomized today.  I am happy about that.  I am even happier that his platelet count is down.  We will see him back now in 2 months.  I think we should be okay for him to come back in 2 months.  Again, with this coronavirus, we are trying to minimize our patients were having to come in unnecessarily.  He is very thankful for our consideration of this.       Volanda Napoleon, MD 3/24/20208:09 AM

## 2018-08-28 ENCOUNTER — Other Ambulatory Visit: Payer: Self-pay | Admitting: Cardiology

## 2018-08-28 ENCOUNTER — Telehealth: Payer: Self-pay | Admitting: Cardiology

## 2018-08-28 DIAGNOSIS — I429 Cardiomyopathy, unspecified: Secondary | ICD-10-CM

## 2018-08-28 NOTE — Telephone Encounter (Signed)
New Message   Pt c/o medication issue:  1. Name of Medication: Entresto   2. How are you currently taking this medication (dosage and times per day)? 24-4m 2x daily   3. Are you having a reaction (difficulty breathing--STAT)? No   4. What is your medication issue? Pt says the cost of the medication is expensive $111   Please call

## 2018-08-28 NOTE — Telephone Encounter (Signed)
Spoke with pt, the application for assistance has been sent to [employer], when I spoke to [employer] they need proof of income from pension and disability, they have reached out to the patient with no reply. Telephone number to [employer] given to the patient.

## 2018-08-28 NOTE — Telephone Encounter (Signed)
Carvedilol 12.5 mg refilled.

## 2018-09-06 DIAGNOSIS — S46091A Other injury of muscle(s) and tendon(s) of the rotator cuff of right shoulder, initial encounter: Secondary | ICD-10-CM | POA: Diagnosis not present

## 2018-09-06 DIAGNOSIS — M47812 Spondylosis without myelopathy or radiculopathy, cervical region: Secondary | ICD-10-CM | POA: Diagnosis not present

## 2018-09-06 DIAGNOSIS — M7918 Myalgia, other site: Secondary | ICD-10-CM | POA: Diagnosis not present

## 2018-09-17 DIAGNOSIS — M545 Low back pain: Secondary | ICD-10-CM | POA: Diagnosis not present

## 2018-09-17 DIAGNOSIS — M2550 Pain in unspecified joint: Secondary | ICD-10-CM | POA: Diagnosis not present

## 2018-09-17 DIAGNOSIS — M0579 Rheumatoid arthritis with rheumatoid factor of multiple sites without organ or systems involvement: Secondary | ICD-10-CM | POA: Diagnosis not present

## 2018-09-17 DIAGNOSIS — M1A09X0 Idiopathic chronic gout, multiple sites, without tophus (tophi): Secondary | ICD-10-CM | POA: Diagnosis not present

## 2018-09-17 DIAGNOSIS — Z6841 Body Mass Index (BMI) 40.0 and over, adult: Secondary | ICD-10-CM | POA: Diagnosis not present

## 2018-09-17 DIAGNOSIS — Z79899 Other long term (current) drug therapy: Secondary | ICD-10-CM | POA: Diagnosis not present

## 2018-09-18 LAB — CUP PACEART REMOTE DEVICE CHECK
Date Time Interrogation Session: 20200513190150
Implantable Lead Implant Date: 20151223
Implantable Lead Implant Date: 20151223
Implantable Lead Implant Date: 20151223
Implantable Lead Location: 753858
Implantable Lead Location: 753859
Implantable Lead Location: 753860
Implantable Pulse Generator Implant Date: 20151223
Pulse Gen Model: 3242
Pulse Gen Serial Number: 7701275

## 2018-09-19 ENCOUNTER — Ambulatory Visit (INDEPENDENT_AMBULATORY_CARE_PROVIDER_SITE_OTHER): Payer: PPO | Admitting: *Deleted

## 2018-09-19 ENCOUNTER — Other Ambulatory Visit: Payer: Self-pay

## 2018-09-19 DIAGNOSIS — I442 Atrioventricular block, complete: Secondary | ICD-10-CM | POA: Diagnosis not present

## 2018-09-19 DIAGNOSIS — I5042 Chronic combined systolic (congestive) and diastolic (congestive) heart failure: Secondary | ICD-10-CM

## 2018-09-24 ENCOUNTER — Encounter: Payer: Self-pay | Admitting: Cardiology

## 2018-09-24 NOTE — Progress Notes (Signed)
Remote pacemaker transmission.   

## 2018-09-25 DIAGNOSIS — R3912 Poor urinary stream: Secondary | ICD-10-CM | POA: Diagnosis not present

## 2018-09-25 DIAGNOSIS — R350 Frequency of micturition: Secondary | ICD-10-CM | POA: Diagnosis not present

## 2018-10-02 ENCOUNTER — Inpatient Hospital Stay: Payer: PPO

## 2018-10-02 ENCOUNTER — Encounter: Payer: Self-pay | Admitting: Hematology & Oncology

## 2018-10-02 ENCOUNTER — Inpatient Hospital Stay: Payer: PPO | Attending: Hematology & Oncology | Admitting: Hematology & Oncology

## 2018-10-02 ENCOUNTER — Other Ambulatory Visit: Payer: Self-pay

## 2018-10-02 VITALS — BP 98/42 | HR 83 | Temp 97.9°F | Resp 20 | Wt 292.0 lb

## 2018-10-02 DIAGNOSIS — D45 Polycythemia vera: Secondary | ICD-10-CM

## 2018-10-02 DIAGNOSIS — I441 Atrioventricular block, second degree: Secondary | ICD-10-CM | POA: Diagnosis not present

## 2018-10-02 DIAGNOSIS — Z950 Presence of cardiac pacemaker: Secondary | ICD-10-CM

## 2018-10-02 DIAGNOSIS — Z79899 Other long term (current) drug therapy: Secondary | ICD-10-CM | POA: Diagnosis not present

## 2018-10-02 DIAGNOSIS — Z7982 Long term (current) use of aspirin: Secondary | ICD-10-CM | POA: Insufficient documentation

## 2018-10-02 LAB — IRON AND TIBC
Iron: 41 ug/dL — ABNORMAL LOW (ref 42–163)
Saturation Ratios: 10 % — ABNORMAL LOW (ref 20–55)
TIBC: 405 ug/dL (ref 202–409)
UIBC: 364 ug/dL (ref 117–376)

## 2018-10-02 LAB — CBC WITH DIFFERENTIAL (CANCER CENTER ONLY)
Abs Immature Granulocytes: 0.09 10*3/uL — ABNORMAL HIGH (ref 0.00–0.07)
Basophils Absolute: 0.2 10*3/uL — ABNORMAL HIGH (ref 0.0–0.1)
Basophils Relative: 2 %
Eosinophils Absolute: 0.1 10*3/uL (ref 0.0–0.5)
Eosinophils Relative: 1 %
HCT: 45.1 % (ref 39.0–52.0)
Hemoglobin: 14.5 g/dL (ref 13.0–17.0)
Immature Granulocytes: 1 %
Lymphocytes Relative: 12 %
Lymphs Abs: 1.3 10*3/uL (ref 0.7–4.0)
MCH: 33 pg (ref 26.0–34.0)
MCHC: 32.2 g/dL (ref 30.0–36.0)
MCV: 102.5 fL — ABNORMAL HIGH (ref 80.0–100.0)
Monocytes Absolute: 0.7 10*3/uL (ref 0.1–1.0)
Monocytes Relative: 7 %
Neutro Abs: 8.4 10*3/uL — ABNORMAL HIGH (ref 1.7–7.7)
Neutrophils Relative %: 77 %
Platelet Count: 339 10*3/uL (ref 150–400)
RBC: 4.4 MIL/uL (ref 4.22–5.81)
RDW: 15.5 % (ref 11.5–15.5)
WBC Count: 10.8 10*3/uL — ABNORMAL HIGH (ref 4.0–10.5)
nRBC: 0 % (ref 0.0–0.2)

## 2018-10-02 LAB — CMP (CANCER CENTER ONLY)
ALT: 14 U/L (ref 0–44)
AST: 18 U/L (ref 15–41)
Albumin: 4.2 g/dL (ref 3.5–5.0)
Alkaline Phosphatase: 70 U/L (ref 38–126)
Anion gap: 10 (ref 5–15)
BUN: 14 mg/dL (ref 8–23)
CO2: 23 mmol/L (ref 22–32)
Calcium: 8.7 mg/dL — ABNORMAL LOW (ref 8.9–10.3)
Chloride: 105 mmol/L (ref 98–111)
Creatinine: 0.96 mg/dL (ref 0.61–1.24)
GFR, Est AFR Am: >60 mL/min (ref >60)
GFR, Estimated: >60 mL/min (ref >60)
Glucose, Bld: 147 mg/dL — ABNORMAL HIGH (ref 70–99)
Potassium: 4.2 mmol/L (ref 3.5–5.1)
Sodium: 138 mmol/L (ref 135–145)
Total Bilirubin: 0.5 mg/dL (ref 0.3–1.2)
Total Protein: 7.2 g/dL (ref 6.5–8.1)

## 2018-10-02 LAB — LACTATE DEHYDROGENASE: LDH: 276 U/L — ABNORMAL HIGH (ref 98–192)

## 2018-10-02 LAB — FERRITIN: Ferritin: 7 ng/mL — ABNORMAL LOW (ref 24–336)

## 2018-10-02 NOTE — Progress Notes (Signed)
Hematology and Oncology Follow Up Visit  William Villanueva [id number] 09/07/1950 68 y.o. 10/02/2018   Principle Diagnosis:   Polycythemia vera-JAK2 positive  Heart block-Mobitz II  Current Therapy:    Hydrea 1000 mg p.o.daily -dose changed on 06/25/2018  Aspirin 81 mg by mouth daily  Phlebotomy to maintain hematocrit below 45%     Interim History:  [name] is back for followup.  He is doing well.  He is managing the coronavirus.  He and his wife are just not going anywhere.  They are still taking care of of a great grandson.  They are expecting a second great grandchild in October.  He has had no problems with his heart.  He has a pacemaker in place.  He has had no issues with bowels or bladder.  He has chronic back issues.  These will always be with him.  He has had no issues with rashes.  He is doing well with his Hydrea dose that we made an adjustment with.  This is worked quite nicely as his platelet count is come down wonderfully.  Overall, his performance status is ECOG 1.    Medications:  Current Outpatient Medications:  .  aspirin 81 MG tablet, Take 81 mg by mouth daily., Disp: , Rfl:  .  BD PEN NEEDLE NANO U/F 32G X 4 MM MISC, , Disp: , Rfl:  .  carvedilol (COREG) 12.5 MG tablet, TAKE ONE TABLET BY MOUTH TWO TIMES A DAY WITH A MEAL, Disp: 180 tablet, Rfl: 2 .  citalopram (CELEXA) 10 MG tablet, Take 5 mg by mouth daily. , Disp: , Rfl:  .  ENTRESTO 24-26 MG, TAKE ONE TABLET BY MOUTH TWICE A DAY, Disp: 180 tablet, Rfl: 2 .  febuxostat (ULORIC) 40 MG tablet, Take 80 mg by mouth daily., Disp: , Rfl:  .  folic acid (FOLVITE) 1 MG tablet, Take 1 mg by mouth daily., Disp: , Rfl:  .  furosemide (LASIX) 20 MG tablet, TAKE ONE TABLET BY MOUTH DAILY, Disp: 60 tablet, Rfl: 8 .  HYDROcodone-acetaminophen (NORCO/VICODIN) 5-325 MG tablet, Take 1 tablet by mouth every 4 (four) hours as needed., Disp: 10 tablet, Rfl: 0 .  hydroxyurea (HYDREA) 500 MG capsule, TAKE ONE TABLET  EVERY OTHER DAY AND 2 TABLETS THEN OTHER DAYS (Patient taking differently: 1,000 mg daily. ), Disp: 135 capsule, Rfl: 1 .  mesalamine (APRISO) 0.375 g 24 hr capsule, Take 375 mg by mouth daily. , Disp: , Rfl:  .  Methotrexate Sodium (METHOTREXATE, PF,) 250 MG/10ML injection, Inject 6 mg into the vein once a week., Disp: , Rfl:  .  omeprazole (PRILOSEC) 20 MG capsule, Take 20 mg by mouth 2 (two) times daily before a meal. , Disp: , Rfl:  .  Probiotic Product (ALIGN PO), Take 1 capsule by mouth daily. , Disp: , Rfl:  .  Spacer/Aero-Holding Chambers (AEROCHAMBER MV) inhaler, Use as instructed, Disp: 1 each, Rfl: 0 .  spironolactone (ALDACTONE) 25 MG tablet, TAKE ONE-HALF TABLET BY MOUTH DAILY, Disp: 45 tablet, Rfl: 2 .  tamsulosin (FLOMAX) 0.4 MG CAPS capsule, Take 0.4 mg by mouth., Disp: , Rfl:  .  Tiotropium Bromide-Olodaterol (STIOLTO RESPIMAT) 2.5-2.5 MCG/ACT AERS, Inhale 2 puffs into the lungs daily., Disp: 1 Inhaler, Rfl: 11 No current facility-administered medications for this visit.   Facility-Administered Medications Ordered in Other Visits:  .  0.9 %  sodium chloride infusion, , Intravenous, Continuous, Swade Shonka, Rudell Cobb, MD, Stopped at 05/16/13 1115  Allergies:  Allergies  Allergen  Reactions  . Bupropion Hives  . Fluoxetine Itching  . Ibuprofen Itching  . Prednisone Itching and Other (See Comments)    Abdominal pain Other reaction(s): Abdominal Pain   . Temazepam Other (See Comments)    Dizziness   . Trazodone And Nefazodone Other (See Comments)    Dizziness  . Cephalexin Diarrhea    Caused C-diff Caused C-diff   . Fluoxetine Hcl Itching  . Prozac [Fluoxetine Hcl] Itching    Past Medical History, Surgical history, Social history, and Family History were reviewed and updated.  Review of Systems: Review of Systems  Constitutional: Negative.   HENT: Negative.   Eyes: Negative.   Respiratory: Positive for shortness of breath.   Cardiovascular: Positive for chest pain.   Gastrointestinal: Negative.   Genitourinary: Negative.   Musculoskeletal: Positive for back pain.  Skin: Negative.   Neurological: Negative.   Endo/Heme/Allergies: Negative.   Psychiatric/Behavioral: Negative.     Physical Exam:  weight is 292 lb (132.5 kg). His oral temperature is 97.9 F (36.6 C). His blood pressure is 98/42 (abnormal) and his pulse is 83. His respiration is 20 and oxygen saturation is 98%.   Physical Exam Vitals signs reviewed.  HENT:     Head: Normocephalic and atraumatic.  Eyes:     Pupils: Pupils are equal, round, and reactive to light.  Neck:     Musculoskeletal: Normal range of motion.  Cardiovascular:     Rate and Rhythm: Normal rate and regular rhythm.     Heart sounds: Normal heart sounds.  Pulmonary:     Effort: Pulmonary effort is normal.     Breath sounds: Normal breath sounds.  Abdominal:     General: Bowel sounds are normal.     Palpations: Abdomen is soft.  Musculoskeletal: Normal range of motion.        General: No tenderness or deformity.  Lymphadenopathy:     Cervical: No cervical adenopathy.  Skin:    General: Skin is warm and dry.     Findings: No erythema or rash.  Neurological:     Mental Status: He is alert and oriented to person, place, and time.  Psychiatric:        Behavior: Behavior normal.        Thought Content: Thought content normal.        Judgment: Judgment normal.      Lab Results  Component Value Date   WBC 10.8 (H) 10/02/2018   HGB 14.5 10/02/2018   HCT 45.1 10/02/2018   MCV 102.5 (H) 10/02/2018   PLT 339 10/02/2018     Chemistry      Component Value Date/Time   NA 139 07/30/2018 0752   NA 141 04/10/2017 0923   NA 140 04/05/2016 0739   K 3.6 07/30/2018 0752   K 3.5 04/10/2017 0923   K 4.2 04/05/2016 0739   CL 105 07/30/2018 0752   CL 108 04/10/2017 0923   CO2 24 07/30/2018 0752   CO2 24 04/10/2017 0923   CO2 20 (L) 04/05/2016 0739   BUN 13 07/30/2018 0752   BUN 12 04/10/2017 0923   BUN 14.7  04/05/2016 0739   CREATININE 1.05 07/30/2018 0752   CREATININE 1.1 04/10/2017 0923   CREATININE 1.1 04/05/2016 0739      Component Value Date/Time   CALCIUM 8.1 (L) 07/30/2018 0752   CALCIUM 8.4 04/10/2017 0923   CALCIUM 9.0 04/05/2016 0739   ALKPHOS 59 07/30/2018 0752   ALKPHOS 77 04/10/2017 0923   ALKPHOS  91 04/05/2016 0739   AST 26 07/30/2018 0752   AST 34 04/05/2016 0739   ALT 27 07/30/2018 0752   ALT 32 04/10/2017 0923   ALT 40 04/05/2016 0739   BILITOT 0.7 07/30/2018 0752   BILITOT 0.48 04/05/2016 0739      Impression and Plan: Mr. Burgo is 68 year old gentleman with polycythemia vera.  He does not need to be phlebotomized today.  I realize that he is above 45% with his hematocrit.  However, with his lower blood pressure, I think that he probably would cause more problems than help him with phlebotomize him.    We will see him back now in 2 months.  I think we should be okay for him to come back in 2 months.  Again, with this coronavirus, we are trying to minimize our patients were having to come in unnecessarily.  He is very thankful for our consideration of this.       Volanda Napoleon, MD 5/27/20209:16 AM

## 2018-10-11 DIAGNOSIS — M47812 Spondylosis without myelopathy or radiculopathy, cervical region: Secondary | ICD-10-CM | POA: Diagnosis not present

## 2018-10-11 DIAGNOSIS — S46091D Other injury of muscle(s) and tendon(s) of the rotator cuff of right shoulder, subsequent encounter: Secondary | ICD-10-CM | POA: Diagnosis not present

## 2018-10-11 DIAGNOSIS — M7918 Myalgia, other site: Secondary | ICD-10-CM | POA: Diagnosis not present

## 2018-10-22 ENCOUNTER — Other Ambulatory Visit: Payer: Self-pay | Admitting: Cardiology

## 2018-11-07 DIAGNOSIS — M069 Rheumatoid arthritis, unspecified: Secondary | ICD-10-CM | POA: Diagnosis not present

## 2018-11-07 DIAGNOSIS — D45 Polycythemia vera: Secondary | ICD-10-CM | POA: Diagnosis not present

## 2018-11-07 DIAGNOSIS — M109 Gout, unspecified: Secondary | ICD-10-CM | POA: Diagnosis not present

## 2018-11-07 DIAGNOSIS — E7800 Pure hypercholesterolemia, unspecified: Secondary | ICD-10-CM | POA: Diagnosis not present

## 2018-11-07 DIAGNOSIS — K219 Gastro-esophageal reflux disease without esophagitis: Secondary | ICD-10-CM | POA: Diagnosis not present

## 2018-11-07 DIAGNOSIS — I5043 Acute on chronic combined systolic (congestive) and diastolic (congestive) heart failure: Secondary | ICD-10-CM | POA: Diagnosis not present

## 2018-11-07 DIAGNOSIS — N183 Chronic kidney disease, stage 3 (moderate): Secondary | ICD-10-CM | POA: Diagnosis not present

## 2018-11-07 DIAGNOSIS — K529 Noninfective gastroenteritis and colitis, unspecified: Secondary | ICD-10-CM | POA: Diagnosis not present

## 2018-11-07 DIAGNOSIS — M47812 Spondylosis without myelopathy or radiculopathy, cervical region: Secondary | ICD-10-CM | POA: Diagnosis not present

## 2018-11-07 DIAGNOSIS — E1122 Type 2 diabetes mellitus with diabetic chronic kidney disease: Secondary | ICD-10-CM | POA: Diagnosis not present

## 2018-11-07 DIAGNOSIS — F321 Major depressive disorder, single episode, moderate: Secondary | ICD-10-CM | POA: Diagnosis not present

## 2018-11-13 DIAGNOSIS — N390 Urinary tract infection, site not specified: Secondary | ICD-10-CM | POA: Diagnosis not present

## 2018-11-26 ENCOUNTER — Inpatient Hospital Stay: Payer: PPO

## 2018-11-26 ENCOUNTER — Other Ambulatory Visit: Payer: Self-pay

## 2018-11-26 ENCOUNTER — Encounter: Payer: Self-pay | Admitting: Hematology & Oncology

## 2018-11-26 ENCOUNTER — Inpatient Hospital Stay: Payer: PPO | Attending: Hematology & Oncology | Admitting: Hematology & Oncology

## 2018-11-26 VITALS — BP 129/70 | HR 76 | Temp 97.7°F | Wt 293.1 lb

## 2018-11-26 DIAGNOSIS — D45 Polycythemia vera: Secondary | ICD-10-CM

## 2018-11-26 DIAGNOSIS — I441 Atrioventricular block, second degree: Secondary | ICD-10-CM | POA: Diagnosis not present

## 2018-11-26 DIAGNOSIS — Z7982 Long term (current) use of aspirin: Secondary | ICD-10-CM

## 2018-11-26 LAB — LACTATE DEHYDROGENASE: LDH: 231 U/L — ABNORMAL HIGH (ref 98–192)

## 2018-11-26 LAB — CBC WITH DIFFERENTIAL (CANCER CENTER ONLY)
Abs Immature Granulocytes: 0.04 10*3/uL (ref 0.00–0.07)
Basophils Absolute: 0.2 10*3/uL — ABNORMAL HIGH (ref 0.0–0.1)
Basophils Relative: 2 %
Eosinophils Absolute: 0.1 10*3/uL (ref 0.0–0.5)
Eosinophils Relative: 1 %
HCT: 43.7 % (ref 39.0–52.0)
Hemoglobin: 14.1 g/dL (ref 13.0–17.0)
Immature Granulocytes: 1 %
Lymphocytes Relative: 17 %
Lymphs Abs: 1.3 10*3/uL (ref 0.7–4.0)
MCH: 33.7 pg (ref 26.0–34.0)
MCHC: 32.3 g/dL (ref 30.0–36.0)
MCV: 104.5 fL — ABNORMAL HIGH (ref 80.0–100.0)
Monocytes Absolute: 0.6 10*3/uL (ref 0.1–1.0)
Monocytes Relative: 8 %
Neutro Abs: 5.4 10*3/uL (ref 1.7–7.7)
Neutrophils Relative %: 71 %
Platelet Count: 270 10*3/uL (ref 150–400)
RBC: 4.18 MIL/uL — ABNORMAL LOW (ref 4.22–5.81)
RDW: 15.7 % — ABNORMAL HIGH (ref 11.5–15.5)
WBC Count: 7.6 10*3/uL (ref 4.0–10.5)
nRBC: 0 % (ref 0.0–0.2)

## 2018-11-26 LAB — IRON AND TIBC
Iron: 32 ug/dL — ABNORMAL LOW (ref 42–163)
Saturation Ratios: 8 % — ABNORMAL LOW (ref 20–55)
TIBC: 409 ug/dL (ref 202–409)
UIBC: 377 ug/dL — ABNORMAL HIGH (ref 117–376)

## 2018-11-26 LAB — CMP (CANCER CENTER ONLY)
ALT: 12 U/L (ref 0–44)
AST: 14 U/L — ABNORMAL LOW (ref 15–41)
Albumin: 4.1 g/dL (ref 3.5–5.0)
Alkaline Phosphatase: 62 U/L (ref 38–126)
Anion gap: 9 (ref 5–15)
BUN: 15 mg/dL (ref 8–23)
CO2: 25 mmol/L (ref 22–32)
Calcium: 8 mg/dL — ABNORMAL LOW (ref 8.9–10.3)
Chloride: 106 mmol/L (ref 98–111)
Creatinine: 0.96 mg/dL (ref 0.61–1.24)
GFR, Est AFR Am: >60 mL/min (ref >60)
GFR, Estimated: >60 mL/min (ref >60)
Glucose, Bld: 139 mg/dL — ABNORMAL HIGH (ref 70–99)
Potassium: 4 mmol/L (ref 3.5–5.1)
Sodium: 140 mmol/L (ref 135–145)
Total Bilirubin: 0.6 mg/dL (ref 0.3–1.2)
Total Protein: 6.4 g/dL — ABNORMAL LOW (ref 6.5–8.1)

## 2018-11-26 LAB — FERRITIN: Ferritin: 8 ng/mL — ABNORMAL LOW (ref 24–336)

## 2018-11-26 NOTE — Progress Notes (Signed)
Hematology and Oncology Follow Up Visit  William Villanueva [id number] May 28, 1950 68 y.o. 11/26/2018   Principle Diagnosis:   Polycythemia vera-JAK2 positive  Heart block-Mobitz II  Current Therapy:    Hydrea 1000 mg p.o.daily -dose changed on 06/25/2018  Aspirin 81 mg by mouth daily  Phlebotomy to maintain hematocrit below 45%     Interim History:  [name] is back for followup.  He is doing well.  He is managing the coronavirus.  He and his wife are just not going anywhere.  They are still taking care of of a great grandson.  They are expecting a second great grandchild in October.  He has had no problems with his heart.  He has a pacemaker in place.  He has had no issues with bowels or bladder.  He has chronic back issues.  These will always be with him.  He has had no issues with rashes.  He is doing well with his Hydrea dose that we made an adjustment with.  This is worked quite nicely as his platelet count is come down wonderfully.  Overall, his performance status is ECOG 1.    Medications:  Current Outpatient Medications:  .  aspirin 81 MG tablet, Take 81 mg by mouth daily., Disp: , Rfl:  .  BD PEN NEEDLE NANO U/F 32G X 4 MM MISC, , Disp: , Rfl:  .  carvedilol (COREG) 12.5 MG tablet, TAKE ONE TABLET BY MOUTH TWO TIMES A DAY WITH A MEAL, Disp: 180 tablet, Rfl: 2 .  citalopram (CELEXA) 10 MG tablet, Take 5 mg by mouth daily. , Disp: , Rfl:  .  ENTRESTO 24-26 MG, TAKE ONE TABLET BY MOUTH TWICE A DAY, Disp: 60 tablet, Rfl: 6 .  febuxostat (ULORIC) 40 MG tablet, Take 80 mg by mouth daily., Disp: , Rfl:  .  folic acid (FOLVITE) 1 MG tablet, Take 1 mg by mouth daily., Disp: , Rfl:  .  furosemide (LASIX) 20 MG tablet, TAKE ONE TABLET BY MOUTH DAILY, Disp: 60 tablet, Rfl: 8 .  HYDROcodone-acetaminophen (NORCO/VICODIN) 5-325 MG tablet, Take 1 tablet by mouth every 4 (four) hours as needed., Disp: 10 tablet, Rfl: 0 .  hydroxyurea (HYDREA) 500 MG capsule, TAKE ONE TABLET EVERY  OTHER DAY AND 2 TABLETS THEN OTHER DAYS (Patient taking differently: 1,000 mg daily. ), Disp: 135 capsule, Rfl: 1 .  mesalamine (APRISO) 0.375 g 24 hr capsule, Take 375 mg by mouth daily. , Disp: , Rfl:  .  Methotrexate Sodium (METHOTREXATE, PF,) 250 MG/10ML injection, Inject 6 mg into the vein once a week., Disp: , Rfl:  .  omeprazole (PRILOSEC) 20 MG capsule, Take 20 mg by mouth 2 (two) times daily before a meal. , Disp: , Rfl:  .  Probiotic Product (ALIGN PO), Take 1 capsule by mouth daily. , Disp: , Rfl:  .  Spacer/Aero-Holding Chambers (AEROCHAMBER MV) inhaler, Use as instructed, Disp: 1 each, Rfl: 0 .  spironolactone (ALDACTONE) 25 MG tablet, TAKE ONE-HALF TABLET BY MOUTH DAILY, Disp: 45 tablet, Rfl: 2 .  tamsulosin (FLOMAX) 0.4 MG CAPS capsule, Take 0.4 mg by mouth., Disp: , Rfl:  .  Tiotropium Bromide-Olodaterol (STIOLTO RESPIMAT) 2.5-2.5 MCG/ACT AERS, Inhale 2 puffs into the lungs daily., Disp: 1 Inhaler, Rfl: 11 No current facility-administered medications for this visit.   Facility-Administered Medications Ordered in Other Visits:  .  0.9 %  sodium chloride infusion, , Intravenous, Continuous, Ennever, Rudell Cobb, MD, Stopped at 05/16/13 1115  Allergies:  Allergies  Allergen  Reactions  . Bupropion Hives  . Fluoxetine Itching  . Ibuprofen Itching  . Prednisone Itching and Other (See Comments)    Abdominal pain Other reaction(s): Abdominal Pain   . Temazepam Other (See Comments)    Dizziness   . Trazodone And Nefazodone Other (See Comments)    Dizziness  . Cephalexin Diarrhea    Caused C-diff Caused C-diff   . Fluoxetine Hcl Itching  . Prozac [Fluoxetine Hcl] Itching    Past Medical History, Surgical history, Social history, and Family History were reviewed and updated.  Review of Systems: Review of Systems  Constitutional: Negative.   HENT: Negative.   Eyes: Negative.   Respiratory: Positive for shortness of breath.   Cardiovascular: Positive for chest pain.   Gastrointestinal: Negative.   Genitourinary: Negative.   Musculoskeletal: Positive for back pain.  Skin: Negative.   Neurological: Negative.   Endo/Heme/Allergies: Negative.   Psychiatric/Behavioral: Negative.     Physical Exam:  weight is 293 lb 1.9 oz (133 kg). His oral temperature is 97.7 F (36.5 C). His blood pressure is 129/70 and his pulse is 76. His oxygen saturation is 97%.   Physical Exam Vitals signs reviewed.  HENT:     Head: Normocephalic and atraumatic.  Eyes:     Pupils: Pupils are equal, round, and reactive to light.  Neck:     Musculoskeletal: Normal range of motion.  Cardiovascular:     Rate and Rhythm: Normal rate and regular rhythm.     Heart sounds: Normal heart sounds.  Pulmonary:     Effort: Pulmonary effort is normal.     Breath sounds: Normal breath sounds.  Abdominal:     General: Bowel sounds are normal.     Palpations: Abdomen is soft.  Musculoskeletal: Normal range of motion.        General: No tenderness or deformity.  Lymphadenopathy:     Cervical: No cervical adenopathy.  Skin:    General: Skin is warm and dry.     Findings: No erythema or rash.  Neurological:     Mental Status: He is alert and oriented to person, place, and time.  Psychiatric:        Behavior: Behavior normal.        Thought Content: Thought content normal.        Judgment: Judgment normal.      Lab Results  Component Value Date   WBC 7.6 11/26/2018   HGB 14.1 11/26/2018   HCT 43.7 11/26/2018   MCV 104.5 (H) 11/26/2018   PLT 270 11/26/2018     Chemistry      Component Value Date/Time   NA 140 11/26/2018 0846   NA 141 04/10/2017 0923   NA 140 04/05/2016 0739   K 4.0 11/26/2018 0846   K 3.5 04/10/2017 0923   K 4.2 04/05/2016 0739   CL 106 11/26/2018 0846   CL 108 04/10/2017 0923   CO2 25 11/26/2018 0846   CO2 24 04/10/2017 0923   CO2 20 (L) 04/05/2016 0739   BUN 15 11/26/2018 0846   BUN 12 04/10/2017 0923   BUN 14.7 04/05/2016 0739   CREATININE  0.96 11/26/2018 0846   CREATININE 1.1 04/10/2017 0923   CREATININE 1.1 04/05/2016 0739      Component Value Date/Time   CALCIUM 8.0 (L) 11/26/2018 0846   CALCIUM 8.4 04/10/2017 0923   CALCIUM 9.0 04/05/2016 0739   ALKPHOS 62 11/26/2018 0846   ALKPHOS 77 04/10/2017 0923   ALKPHOS 91 04/05/2016 0739  AST 14 (L) 11/26/2018 0846   AST 34 04/05/2016 0739   ALT 12 11/26/2018 0846   ALT 32 04/10/2017 0923   ALT 40 04/05/2016 0739   BILITOT 0.6 11/26/2018 0846   BILITOT 0.48 04/05/2016 0739      Impression and Plan: Mr. Schaller is 68 year old gentleman with polycythemia vera.  He does not need to be phlebotomized today.  I realize that he is above 45% with his hematocrit.  However, with his lower blood pressure, I think that he probably would cause more problems than help him with phlebotomize him.    We will see him back now in 3 months.  I think we should be okay for him to come back in 3 months.  Again, with this coronavirus, we are trying to minimize our patients were having to come in unnecessarily.  He is very thankful for our consideration of this.       Volanda Napoleon, MD 7/21/20209:48 AM

## 2018-12-09 ENCOUNTER — Other Ambulatory Visit: Payer: Self-pay | Admitting: Hematology & Oncology

## 2018-12-09 DIAGNOSIS — D45 Polycythemia vera: Secondary | ICD-10-CM

## 2018-12-11 DIAGNOSIS — E7800 Pure hypercholesterolemia, unspecified: Secondary | ICD-10-CM | POA: Diagnosis not present

## 2018-12-11 DIAGNOSIS — E1122 Type 2 diabetes mellitus with diabetic chronic kidney disease: Secondary | ICD-10-CM | POA: Diagnosis not present

## 2018-12-11 DIAGNOSIS — N183 Chronic kidney disease, stage 3 (moderate): Secondary | ICD-10-CM | POA: Diagnosis not present

## 2018-12-12 DIAGNOSIS — B356 Tinea cruris: Secondary | ICD-10-CM | POA: Diagnosis not present

## 2018-12-16 DIAGNOSIS — Z86010 Personal history of colonic polyps: Secondary | ICD-10-CM | POA: Diagnosis not present

## 2018-12-16 DIAGNOSIS — D120 Benign neoplasm of cecum: Secondary | ICD-10-CM | POA: Diagnosis not present

## 2018-12-17 ENCOUNTER — Other Ambulatory Visit: Payer: Self-pay | Admitting: Cardiology

## 2018-12-17 DIAGNOSIS — R0602 Shortness of breath: Secondary | ICD-10-CM

## 2018-12-19 ENCOUNTER — Ambulatory Visit (INDEPENDENT_AMBULATORY_CARE_PROVIDER_SITE_OTHER): Payer: PPO | Admitting: *Deleted

## 2018-12-19 DIAGNOSIS — I442 Atrioventricular block, complete: Secondary | ICD-10-CM

## 2018-12-19 DIAGNOSIS — I5042 Chronic combined systolic (congestive) and diastolic (congestive) heart failure: Secondary | ICD-10-CM

## 2018-12-19 LAB — CUP PACEART REMOTE DEVICE CHECK
Battery Remaining Longevity: 64 mo
Battery Remaining Percentage: 89 %
Battery Voltage: 2.95 V
Brady Statistic AP VP Percent: 14 %
Brady Statistic AP VS Percent: 1 %
Brady Statistic AS VP Percent: 84 %
Brady Statistic AS VS Percent: 1 %
Brady Statistic RA Percent Paced: 12 %
Brady Statistic RV Percent Paced: 98 %
Date Time Interrogation Session: 20200812060016
Implantable Lead Implant Date: 20151223
Implantable Lead Implant Date: 20151223
Implantable Lead Implant Date: 20151223
Implantable Lead Location: 753858
Implantable Lead Location: 753859
Implantable Lead Location: 753860
Implantable Pulse Generator Implant Date: 20151223
Lead Channel Impedance Value: 440 Ohm
Lead Channel Impedance Value: 450 Ohm
Lead Channel Impedance Value: 600 Ohm
Lead Channel Pacing Threshold Amplitude: 1.125 V
Lead Channel Pacing Threshold Amplitude: 1.375 V
Lead Channel Pacing Threshold Pulse Width: 0.4 ms
Lead Channel Pacing Threshold Pulse Width: 0.7 ms
Lead Channel Sensing Intrinsic Amplitude: 2.4 mV
Lead Channel Sensing Intrinsic Amplitude: 9.2 mV
Lead Channel Setting Pacing Amplitude: 2.125
Lead Channel Setting Pacing Amplitude: 2.375
Lead Channel Setting Pacing Amplitude: 5 V
Lead Channel Setting Pacing Pulse Width: 0.4 ms
Lead Channel Setting Pacing Pulse Width: 0.7 ms
Lead Channel Setting Sensing Sensitivity: 2 mV
Pulse Gen Model: 3242
Pulse Gen Serial Number: 7701275

## 2018-12-27 ENCOUNTER — Encounter: Payer: Self-pay | Admitting: Cardiology

## 2018-12-27 NOTE — Progress Notes (Signed)
Remote pacemaker transmission.   

## 2019-01-01 NOTE — Progress Notes (Signed)
HPI: FU cardiomyopathy/CHF. Echocardiogram in August of 2013 showed an ejection fraction of 35-40%. Cardiac catheterization in September of 2013 showed mild nonobstructive coronary disease. There was no hemodynamic evidence of restriction. Pulmonary capillary wedge pressure 10. Cardiac MRI in September of 2013 showed an ejection fraction of 34% with diffuse hypokinesis. There was no hyperenhancement or scar tissue and no evidence of cardiac hemochromatosis. TSH in September 2013 normal. 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 Patient subsequently had CRTP placed. Nuclear study March 2017 showed ejection fraction 32. There was prior inferior infarct but no ischemia. Treated medically as felt likely attenuation artifact. Echocardiogram 1/20 showed ejection fraction 45-50%.Since last seen,he has dyspnea on exertion but no orthopnea, PND, pedal edema, chest pain or syncope.   Current Outpatient Medications  Medication Sig Dispense Refill  . aspirin 81 MG tablet Take 81 mg by mouth daily.    . BD PEN NEEDLE NANO U/F 32G X 4 MM MISC     . carvedilol (COREG) 12.5 MG tablet TAKE ONE TABLET BY MOUTH TWO TIMES A DAY WITH A MEAL 180 tablet 2  . citalopram (CELEXA) 10 MG tablet Take 5 mg by mouth daily.     [name] ENTRESTO 24-26 MG TAKE ONE TABLET BY MOUTH TWICE A DAY 60 tablet 6  . febuxostat (ULORIC) 40 MG tablet Take 80 mg by mouth daily.    . folic acid (FOLVITE) 1 MG tablet Take 1 mg by mouth daily.    . furosemide (LASIX) 20 MG tablet TAKE ONE TABLET BY MOUTH DAILY 60 tablet 8  . HYDROcodone-acetaminophen (NORCO/VICODIN) 5-325 MG tablet Take 1 tablet by mouth every 4 (four) hours as needed. 10 tablet 0  . hydroxyurea (HYDREA) 500 MG capsule Take 2 capsules (1,000 mg total) by mouth daily. 90 capsule 6  . mesalamine (APRISO) 0.375 g 24 hr capsule Take 375 mg by mouth daily.     . Methotrexate Sodium (METHOTREXATE, PF,) 250 MG/10ML injection Inject 6 mg into the vein once a week.     [name] omeprazole (PRILOSEC) 20 MG capsule Take 20 mg by mouth 2 (two) times daily before a meal.     . Probiotic Product (ALIGN PO) Take 1 capsule by mouth daily.     [name] Spacer/Aero-Holding Chambers (AEROCHAMBER MV) inhaler Use as instructed 1 each 0  . spironolactone (ALDACTONE) 25 MG tablet Take 0.5 tablets (12.5 mg total) by mouth daily. KEEP APPOINTMENT 15 tablet 0  . tamsulosin (FLOMAX) 0.4 MG CAPS capsule Take 0.4 mg by mouth.    . Tiotropium Bromide-Olodaterol (STIOLTO RESPIMAT) 2.5-2.5 MCG/ACT AERS Inhale 2 puffs into the lungs daily. 1 Inhaler 11   No current facility-administered medications for this visit.    Facility-Administered Medications Ordered in Other Visits  Medication Dose Route Frequency Provider Last Rate Last Dose  . 0.9 %  sodium chloride infusion   Intravenous Continuous Volanda Napoleon, MD   Stopped at 05/16/13 1115     Past Medical History:  Diagnosis Date  . AV block, 2nd degree 2015   St. Jude Allure Quadra pulse generator [id number], model PM 3242  . Back pain   . Cardiomyopathy ([place])    Nonischemic 45%.   . CHF (congestive heart failure) ([place])   . Gout   . Hemochromatosis   . Hypertension   . Hypospadias [birth year]   born with  . Nephrolithiasis   . Polycythemia vera(238.4)     Past Surgical History:  Procedure Laterality Date  .  ANKLE SURGERY    . BI-VENTRICULAR PACEMAKER INSERTION N/A 04/29/2014   Procedure: BI-VENTRICULAR PACEMAKER INSERTION (CRT-P);  Surgeon: Deboraha Sprang, MD; Laterality: Left  St. Jude Allure Quadra pulse generator [id number], model PM [phone]  . PILONIDAL CYST EXCISION    . POSTERIOR LAMINECTOMY / DECOMPRESSION LUMBAR SPINE    . TONSILLECTOMY      Social History   Socioeconomic History  . Marital status: Married    Spouse name: Not on file  . Number of children: Not on file  . Years of education: Not on file  . Highest education level: Not on file  Occupational History  . Not on file  Social Needs  . Financial resource  strain: Not on file  . Food insecurity    Worry: Not on file    Inability: Not on file  . Transportation needs    Medical: Not on file    Non-medical: Not on file  Tobacco Use  . Smoking status: Former Smoker    Packs/day: 1.00    Years: 44.00    Pack years: 44.00    Types: Cigarettes    Start date: 06/05/1968    Quit date: 04/05/2013    Years since quitting: 5.7  . Smokeless tobacco: Never Used  . Tobacco comment: quit 2 years ago  Substance and Sexual Activity  . Alcohol use: Yes    Alcohol/week: 0.0 standard drinks    Comment: rare  . Drug use: No  . Sexual activity: Not Currently  Lifestyle  . Physical activity    Days per week: Not on file    Minutes per session: Not on file  . Stress: Not on file  Relationships  . Social Herbalist on phone: Not on file    Gets together: Not on file    Attends religious service: Not on file    Active member of club or organization: Not on file    Attends meetings of clubs or organizations: Not on file    Relationship status: Not on file  . Intimate partner violence    Fear of current or ex partner: Not on file    Emotionally abused: Not on file    Physically abused: Not on file    Forced sexual activity: Not on file  Other Topics Concern  . Not on file  Social History Narrative   Lives at home with wife in a one story home.  Has no children.  Does not work.  Getting workman's comp.  Education: 4 years trade school.     Family History  Problem Relation Age of Onset  . Heart disease Maternal Grandmother        Pacemaker, MI  . Stroke Mother   . Other Father        Deceased, car fell on him  . Diabetes Sister   . Hypertension Sister     ROS: no fevers or chills, productive cough, hemoptysis, dysphasia, odynophagia, melena, hematochezia, dysuria, hematuria, rash, seizure activity, orthopnea, PND, pedal edema, claudication. Remaining systems are negative.  Physical Exam: Well-developed obese in no acute  distress.  Skin is warm and dry.  HEENT is normal.  Neck is supple.  Chest is clear to auscultation with normal expansion.  Cardiovascular exam is regular rate and rhythm.  Abdominal exam nontender or distended. No masses palpated. Extremities show no edema. neuro grossly intact  ECG-sinus rhythm with ventricular pacing.  Personally reviewed  A/P  1 nonischemic cardiomyopathy-plan to continue medical therapy with  Entresto and carvedilol.  2 chronic combined systolic/diastolic congestive heart failure-euvolemic on evaluation today.  Continue present dose of diuretic.  Continue fluid restriction and low-sodium diet.  3 Hypertension-patient's blood pressure is controlled.  Continue present medications and follow.  4 CRT P-managed by electrophysiology.  5 morbid obesity-we discussed weight loss, diet and exercise.  Kirk Ruths, MD

## 2019-01-06 ENCOUNTER — Ambulatory Visit: Payer: PPO | Admitting: Cardiology

## 2019-01-06 ENCOUNTER — Encounter: Payer: Self-pay | Admitting: Cardiology

## 2019-01-06 ENCOUNTER — Other Ambulatory Visit: Payer: Self-pay

## 2019-01-06 VITALS — BP 126/76 | HR 92 | Ht 70.0 in | Wt 286.0 lb

## 2019-01-06 DIAGNOSIS — Z950 Presence of cardiac pacemaker: Secondary | ICD-10-CM | POA: Diagnosis not present

## 2019-01-06 DIAGNOSIS — I5042 Chronic combined systolic (congestive) and diastolic (congestive) heart failure: Secondary | ICD-10-CM

## 2019-01-06 DIAGNOSIS — I428 Other cardiomyopathies: Secondary | ICD-10-CM | POA: Diagnosis not present

## 2019-01-06 DIAGNOSIS — I10 Essential (primary) hypertension: Secondary | ICD-10-CM

## 2019-01-06 NOTE — Patient Instructions (Signed)

## 2019-01-08 ENCOUNTER — Other Ambulatory Visit: Payer: Self-pay | Admitting: Cardiology

## 2019-01-08 DIAGNOSIS — R0602 Shortness of breath: Secondary | ICD-10-CM

## 2019-01-10 DIAGNOSIS — Z23 Encounter for immunization: Secondary | ICD-10-CM | POA: Diagnosis not present

## 2019-02-04 DIAGNOSIS — M069 Rheumatoid arthritis, unspecified: Secondary | ICD-10-CM | POA: Diagnosis not present

## 2019-02-04 DIAGNOSIS — I5042 Chronic combined systolic (congestive) and diastolic (congestive) heart failure: Secondary | ICD-10-CM | POA: Diagnosis not present

## 2019-02-04 DIAGNOSIS — R399 Unspecified symptoms and signs involving the genitourinary system: Secondary | ICD-10-CM | POA: Diagnosis not present

## 2019-02-04 DIAGNOSIS — M25511 Pain in right shoulder: Secondary | ICD-10-CM | POA: Diagnosis not present

## 2019-02-11 DIAGNOSIS — M25511 Pain in right shoulder: Secondary | ICD-10-CM | POA: Diagnosis not present

## 2019-02-11 DIAGNOSIS — M75111 Incomplete rotator cuff tear or rupture of right shoulder, not specified as traumatic: Secondary | ICD-10-CM | POA: Diagnosis not present

## 2019-02-19 ENCOUNTER — Ambulatory Visit: Payer: PPO | Attending: Family Medicine | Admitting: Physical Therapy

## 2019-02-19 ENCOUNTER — Other Ambulatory Visit: Payer: Self-pay

## 2019-02-19 ENCOUNTER — Encounter: Payer: Self-pay | Admitting: Physical Therapy

## 2019-02-19 VITALS — BP 118/64 | HR 87

## 2019-02-19 DIAGNOSIS — M25511 Pain in right shoulder: Secondary | ICD-10-CM | POA: Diagnosis not present

## 2019-02-19 DIAGNOSIS — R293 Abnormal posture: Secondary | ICD-10-CM | POA: Diagnosis not present

## 2019-02-19 DIAGNOSIS — M25611 Stiffness of right shoulder, not elsewhere classified: Secondary | ICD-10-CM | POA: Diagnosis not present

## 2019-02-19 DIAGNOSIS — G8929 Other chronic pain: Secondary | ICD-10-CM

## 2019-02-19 DIAGNOSIS — M6281 Muscle weakness (generalized): Secondary | ICD-10-CM | POA: Insufficient documentation

## 2019-02-19 NOTE — Therapy (Signed)
[facility] [address]  [place], [place], [address] Phone: [phone]   Fax:  [phone]  Physical Therapy Evaluation  Patient Details  Name: William Villanueva MRN: [id number] Date of Birth: October 30, 1950 Referring Provider (PT): Rhina Brackett, MD   Encounter Date: 02/19/2019  PT End of Session - 02/19/19 1138    Visit Number  1    Number of Visits  13    Date for PT Re-Evaluation  04/02/19    Authorization Type  [employer]    PT Start Time  1051    PT Stop Time  1129    PT Time Calculation (min)  38 min    Activity Tolerance  Patient tolerated treatment well;Patient limited by pain    Behavior During Therapy  [facility] for tasks assessed/performed       Past Medical History:  Diagnosis Date  . AV block, 2nd degree 2015   St. Jude Allure Quadra pulse generator [id number], model PM 3242  . Back pain   . Cardiomyopathy ([place])    Nonischemic 45%.   . CHF (congestive heart failure) ([place])   . Gout   . Hemochromatosis   . Hypertension   . Hypospadias 1951/05/01   born with  . Nephrolithiasis   . Polycythemia vera(238.4)     Past Surgical History:  Procedure Laterality Date  . ANKLE SURGERY    . BI-VENTRICULAR PACEMAKER INSERTION N/A 04/29/2014   Procedure: BI-VENTRICULAR PACEMAKER INSERTION (CRT-P);  Surgeon: Deboraha Sprang, MD; Laterality: Left  St. Jude Allure Quadra pulse generator [phone], model PM [phone]  . PILONIDAL CYST EXCISION    . POSTERIOR LAMINECTOMY / DECOMPRESSION LUMBAR SPINE    . TONSILLECTOMY      Vitals:   02/19/19 1058  BP: 118/64  Pulse: 87  SpO2: 96%     Subjective Assessment - 02/19/19 1101    Subjective  Patient reports injuring R shoulder 6-7 months ago after falling over one of his grandson's toys. Pain is located over R anterior shoulder, with intermittent radiation down anterior arm to elbow at night. Denies N/T. Worse at night, with carrying, lifting, R sidelying, reaching out  to the side. Recent injection helped with pain, as does taking Advil occasionally. 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 Currently back pain is limiting him considerably with activities at home.    Pertinent History  polycythemia vera, HTN, hemochromatosis, gout, CHF, cardiomyopathy, back pain, 2nd degree AV block, lumbar spinal lami/decompression, pacemaker, ankle surgery    Limitations  Lifting;House hold activities    Diagnostic tests  per patient- x-ray and US done, showing partial tear of RTC    Patient Stated Goals  "I don't want it to hurt no more"    Currently in Pain?  Yes    Pain Score  0-No pain    Pain Location  Shoulder    Pain Orientation  Right;Anterior    Pain Descriptors / Indicators  Shooting    Pain Type  Chronic pain         [facility] PT Assessment - 02/19/19 1106      Assessment   Medical Diagnosis  R shoulder pain, incomplete RTC tear    Referring Provider (PT)  Rhina Brackett, MD    Onset Date/Surgical Date  07/20/18    Hand Dominance  Right    Next MD Visit  patient unsure    Prior Therapy  yes  Precautions   Precautions  ICD/Pacemaker   blood CA, CHF- no e-stim, [place], or compression     Balance Screen   Has the patient fallen in the past 6 months  Yes    How many times?  3    Has the patient had a decrease in activity level because of a fear of falling?   No    Is the patient reluctant to leave their home because of a fear of falling?   No      Home Film/video editor residence    Living Arrangements  Spouse/significant other    Available Help at Discharge  Family    Type of [place]      Prior Function   Level of [place] with basic ADLs   wife does cooking and cleaning d/t R shoulder and back pain   Vocation  Workers comp   d/t back injury   Science writer    Leisure  play with grandson      Cognition   Overall Cognitive Status  Within  Functional Limits for tasks assessed      Observation/Other Assessments   Focus on Therapeutic Outcomes (FOTO)   Shoulder: 59 (41% limited, 34% predicted)      Sensation   Light Touch  Appears Intact      Coordination   Gross Motor Movements are Fluid and Coordinated  Yes      Posture/Postural Control   Posture/Postural Control  Postural limitations    Postural Limitations  Rounded Shoulders;Posterior pelvic tilt      ROM / Strength   AROM / PROM / Strength  AROM;Strength      AROM   AROM Assessment Site  Shoulder    Right/Left Shoulder  Right;Left    Right Shoulder Flexion  116 Degrees   mild pain   Right Shoulder ABduction  76 Degrees   moderate pain   Right Shoulder Internal Rotation  --   FIR T11 w/ moderate pain   Right Shoulder External Rotation  --   FER C3 w/ pain   Left Shoulder Flexion  146 Degrees    Left Shoulder ABduction  142 Degrees    Left Shoulder Internal Rotation  --   FIR T8   Left Shoulder External Rotation  --   FER    Left Shoulder Horizontal ABduction  --   FER T3     Strength   Strength Assessment Site  Shoulder    Right/Left Shoulder  Right;Left    Right Shoulder Flexion  4+/5    Right Shoulder ABduction  4-/5   severe pain   Right Shoulder Internal Rotation  4/5    Right Shoulder External Rotation  4-/5   pain   Left Shoulder Flexion  4+/5    Left Shoulder ABduction  4+/5    Left Shoulder Internal Rotation  4+/5    Left Shoulder External Rotation  4/5      Palpation   Palpation comment  increased tone in R UT, TTP in R infraspinatus/teres group, lateral deltoid, and proximal biceps      Ambulation/Gait   Assistive device  None    Gait Pattern  Step-through pattern;Lateral trunk lean to left;Antalgic                Objective measurements completed on examination: See above findings.              PT Education -  02/19/19 1137    Education Details  prognosis, POC, HEP- advised to avoid pushing into pain     Person(s) Educated  Patient    Methods  Explanation;Demonstration;Tactile cues;Verbal cues;Handout    Comprehension  Verbalized understanding;Returned demonstration       PT Short Term Goals - 02/19/19 1147      PT SHORT TERM GOAL #1   Title  Patient to be independent with initial HEP.    Time  3    Period  Weeks    Status  New    Target Date  03/12/19        PT Long Term Goals - 02/19/19 1148      PT LONG TERM GOAL #1   Title  Patient to be independent with advanced HEP.    Time  6    Period  Weeks    Status  New    Target Date  04/02/19      PT LONG TERM GOAL #2   Title  Patient to demonstrate R shoulder AROM symmetrical to opposite UE and without pain limiting.    Time  6    Period  Weeks    Status  New    Target Date  04/02/19      PT LONG TERM GOAL #3   Title  Patient to demonstrate R shoulder strength >=4+/5.    Time  6    Period  Weeks    Status  New    Target Date  04/02/19      PT LONG TERM GOAL #4   Title  Patient to report tolerance of lifting his grandson without R shoulder pain limiting.    Time  6    Period  Weeks    Status  New    Target Date  04/02/19      PT LONG TERM GOAL #5   Title  Patient to demonstrate overhead lifting with R arm with 5lbs and good scapulohumeral rhythm.    Time  6    Period  Weeks    Status  New    Target Date  04/02/19             Plan - 02/19/19 1138    Clinical Impression Statement  Patient is a 68y/o M presenting to OPPT with c/o chronic R shoulder pain after a fall that occurred 6-7 months ago. Pain has improved with recent injection, and is located over R anterior arm with radiation down to elbow. Worse in PM, with carrying, lifting, R sidelying, reaching out to the side. Patient today demonstrating decreased and painful R shoulder AROM, decreased shoulder strength, abnormal posture, and TTP in R infraspinatus/teres group, lateral deltoid, and proximal biceps. Educated patient on gentle AAROM HEP- patient  reported understanding. Would benefit from skilled PT services 2x/week for 6 weeks to address aforementioned impairments.    Personal Factors and Comorbidities  Age;Comorbidity 3+;Time since onset of injury/illness/exacerbation;Fitness;Past/Current Experience;Profession    Comorbidities  polycythemia vera, HTN, hemochromatosis, gout, CHF, cardiomyopathy, back pain, 2nd degree AV block, lumbar spinal lami/decompression, pacemaker, ankle surgery    Examination-Activity Limitations  Sleep;Bed Mobility;Caring for Others;Carry;Dressing;Hygiene/Grooming;Lift;Reach Overhead    Examination-Participation Restrictions  Cleaning;Shop;Community Activity;Driving;Yard Work;Interpersonal Relationship;Laundry;Meal Prep    Stability/Clinical Decision Making  Stable/Uncomplicated    Clinical Decision Making  Low    Rehab Potential  Good    PT Frequency  2x / week    PT Duration  6 weeks    PT Treatment/Interventions  ADLs/Self Care Home Management;Cryotherapy;Iontophoresis  4mg /ml Dexamethasone;Moist Heat;Therapeutic exercise;Therapeutic activities;Functional mobility training;Neuromuscular re-education;Patient/family education;Manual techniques;Taping;Energy conservation;Dry needling;Passive range of motion    PT Next Visit Plan  reassess HEP    Consulted and Agree with Plan of Care  Patient       Patient will benefit from skilled therapeutic intervention in order to improve the following deficits and impairments:  Increased edema, Decreased activity tolerance, Decreased strength, Impaired UE functional use, Pain, Increased fascial restricitons, Increased muscle spasms, Improper body mechanics, Decreased range of motion, Impaired flexibility, Postural dysfunction  Visit Diagnosis: Chronic right shoulder pain  Stiffness of right shoulder, not elsewhere classified  Muscle weakness (generalized)  Abnormal posture     Problem List Patient Active Problem List   Diagnosis Date Noted  . CHF (congestive heart  failure) ([place]) 07/17/2017  . COPD (chronic obstructive pulmonary disease) ([place]) 06/14/2017  . Acute renal failure (ARF) ([place]) 05/27/2016  . Hyponatremia 05/27/2016  . Hypokalemia 05/27/2016  . AKI (acute kidney injury) ([place]) 05/26/2016  . Depression 04/05/2016  . Kidney stones 04/05/2016  . Osteoarthritis 04/05/2016  . Sleep apnea 04/05/2016  . Bilateral carpal tunnel syndrome 12/02/2015  . Chronic combined systolic and diastolic congestive heart failure ([place]) 04/13/2015  . Orthostatic hypotension 01/19/2015  . Obesity 11/26/2014  . Enlarged prostate without lower urinary tract symptoms (luts) 10/29/2014  . Essential hypertension 10/29/2014  . GERD (gastroesophageal reflux disease) 10/29/2014  . Panic attack 10/29/2014  . Changing skin lesion 10/16/2014  . Acute on chronic combined systolic and diastolic congestive heart failure ([place]) 10/14/2014  . Renal cyst 09/02/2014  . Pacemaker lead malfunction-elevated threshold RV lead 07/31/2014  . Dyspnea on exertion 06/21/2014  . Noninfective gastroenteritis and colitis 06/15/2014  . 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 Palpitations 06/11/2014  . 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  . Other cardiomyopathies ([place]) 04/29/2014  . Bradycardia 04/28/2014  . Chronic pain 02/12/2014  . 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 Muscular wasting and disuse atrophy 08/15/2013  . Lumbar and sacral osteoarthritis 05/19/2013  . Congestive dilated cardiomyopathy ([place]) 01/11/2012  . Compulsive tobacco user syndrome 12/13/2011  . Current tobacco use 12/13/2011  . Chest tightness   . Rash   . Back pain   . Hemochromatosis   . SOB (shortness of breath)   . Gout   . Polycythemia vera ([place]) 03/27/2011     Janene Harvey, PT, DPT 02/19/19 11:52 AM   [facility] [address]  [place], [place], [address] Phone: [phone]   Fax:  [phone]  Name: Mattix Badia MRN:  [id number] Date of Birth: 08/28/1950

## 2019-02-25 ENCOUNTER — Ambulatory Visit: Payer: PPO | Admitting: Physical Therapy

## 2019-02-25 ENCOUNTER — Encounter: Payer: Self-pay | Admitting: Physical Therapy

## 2019-02-25 ENCOUNTER — Other Ambulatory Visit: Payer: Self-pay

## 2019-02-25 VITALS — BP 135/63 | HR 85

## 2019-02-25 DIAGNOSIS — M25611 Stiffness of right shoulder, not elsewhere classified: Secondary | ICD-10-CM

## 2019-02-25 DIAGNOSIS — G8929 Other chronic pain: Secondary | ICD-10-CM

## 2019-02-25 DIAGNOSIS — M25511 Pain in right shoulder: Secondary | ICD-10-CM

## 2019-02-25 DIAGNOSIS — M6281 Muscle weakness (generalized): Secondary | ICD-10-CM

## 2019-02-25 DIAGNOSIS — R293 Abnormal posture: Secondary | ICD-10-CM

## 2019-02-25 NOTE — Therapy (Signed)
[facility] [address]  [place], [place], [address] Phone: [phone]   Fax:  [phone]  Physical Therapy Treatment  Patient Details  Name: William Villanueva MRN: [id number] Date of Birth: 1950/07/06 Referring Provider (PT): Rhina Brackett, MD   Encounter Date: 02/25/2019  PT End of Session - 02/25/19 1057    Visit Number  2    Number of Visits  13    Date for PT Re-Evaluation  04/02/19    Authorization Type  [employer]    PT Start Time  1012    PT Stop Time  1056    PT Time Calculation (min)  44 min    Activity Tolerance  Patient tolerated treatment well    Behavior During Therapy  [facility] for tasks assessed/performed       Past Medical History:  Diagnosis Date  . AV block, 2nd degree 2015   St. Jude Allure Quadra pulse generator [id number], model PM 3242  . Back pain   . Cardiomyopathy ([place])    Nonischemic 45%.   . CHF (congestive heart failure) ([place])   . Gout   . Hemochromatosis   . Hypertension   . Hypospadias [birth year]   born with  . Nephrolithiasis   . Polycythemia vera(238.4)     Past Surgical History:  Procedure Laterality Date  . ANKLE SURGERY    . BI-VENTRICULAR PACEMAKER INSERTION N/A 04/29/2014   Procedure: BI-VENTRICULAR PACEMAKER INSERTION (CRT-P);  Surgeon: Deboraha Sprang, MD; Laterality: Left  St. Jude Allure Quadra pulse generator [id number], model PM [phone]  . PILONIDAL CYST EXCISION    . POSTERIOR LAMINECTOMY / DECOMPRESSION LUMBAR SPINE    . TONSILLECTOMY      Vitals:   02/25/19 1012  BP: 135/63  Pulse: 85  SpO2: 96%    Subjective Assessment - 02/25/19 1012    Subjective  Doing okay. Doing "some" of the exercises.    Pertinent History  polycythemia vera, HTN, hemochromatosis, gout, CHF, cardiomyopathy, back pain, 2nd degree AV block, lumbar spinal lami/decompression, pacemaker, ankle surgery    Diagnostic tests  per patient- x-ray and US done, showing partial tear of RTC     Patient Stated Goals  "I don't want it to hurt no more"    Currently in Pain?  Yes    Pain Score  4     Pain Location  Shoulder    Pain Orientation  Right;Anterior    Pain Descriptors / Indicators  --   "just there"   Pain Type  Chronic pain                       [facility] Adult PT Treatment/Exercise - 02/25/19 0001      Exercises   Exercises  Shoulder      Shoulder Exercises: Supine   Protraction  AAROM;Right;10 reps    Protraction Limitations  serratus punches with wand   cues to maintain elbows straight   External Rotation  AAROM;Right;10 reps;Limitations    External Rotation Limitations  with wand and elbows at side to tolerance    Internal Rotation  AAROM;Right;10 reps    Internal Rotation Limitations  with wand and elbows in at sides   cues to stop before pain   Flexion  AAROM;Right;10 reps    Flexion Limitations  with wand to tolerance   cues to stop before pain   ABduction  AAROM;Right;10 reps    ABduction Limitations  scaption with wand to  tolerance   cues to stop before pain     Shoulder Exercises: Sidelying   External Rotation  Strengthening;Right;10 reps    External Rotation Weight (lbs)  0,1    External Rotation Limitations  with shoulder in neutral; 2nd set with 1#   cues for slight scap retraction   ABduction  AROM;Right;10 reps    ABduction Limitations  thumb up, to tolerance   visible difficulty lowering with control     Shoulder Exercises: Standing   Row  Strengthening;Both;10 reps;Theraband    Theraband Level (Shoulder Row)  Level 2 (Red)    Row Limitations  cues for scapular retraction and depression      Shoulder Exercises: Pulleys   Flexion  3 minutes    Flexion Limitations  cues to avoid pushing into pain    Scaption  3 minutes    Scaption Limitations  cues to avoid pushing into pain      Manual Therapy   Manual Therapy  Soft tissue mobilization;Myofascial release;Passive ROM    Manual therapy comments  supine, elevated on  wedge    Soft tissue mobilization  STM to R proximal biceps tendon and biceps muscle belly- increased soft tissue restriction and TTP    Myofascial Release  manual TPR to R biceps muscle belly    Passive ROM  R shoulder PROM in all planes to tolerance             PT Education - 02/25/19 1057    Education Details  update to HEP; administered red TB    Person(s) Educated  Patient    Methods  Explanation;Demonstration;Tactile cues;Verbal cues;Handout    Comprehension  Verbalized understanding;Returned demonstration       PT Short Term Goals - 02/25/19 1210      PT SHORT TERM GOAL #1   Title  Patient to be independent with initial HEP.    Time  3    Period  Weeks    Status  Partially Met    Target Date  03/12/19        PT Long Term Goals - 02/25/19 1210      PT LONG TERM GOAL #1   Title  Patient to be independent with advanced HEP.    Time  6    Period  Weeks    Status  On-going      PT LONG TERM GOAL #2   Title  Patient to demonstrate R shoulder AROM symmetrical to opposite UE and without pain limiting.    Time  6    Period  Weeks    Status  On-going      PT LONG TERM GOAL #3   Title  Patient to demonstrate R shoulder strength >=4+/5.    Time  6    Period  Weeks    Status  On-going      PT LONG TERM GOAL #4   Title  Patient to report tolerance of lifting his grandson without R shoulder pain limiting.    Time  6    Period  Weeks    Status  On-going      PT LONG TERM GOAL #5   Title  Patient to demonstrate overhead lifting with R arm with 5lbs and good scapulohumeral rhythm.    Time  6    Period  Weeks    Status  On-going            Plan - 02/25/19 1206    Clinical Impression Statement  Patient  reported performing "some" of his HEP since last session and admitted to pushing himself into pain with the exercises that he did. Educated patient on avoiding pushing into pain with HEP in order to avoid flare up- patient reported understanding. Patient  tolerated gentle R shoulder PROM in all planes- demonstrated good overall ROM but with c/o pain when lowering down from abduction. Able to improve comfort by placing pillow under R UE. Soft tissue restriction and tender trigger points were present throughout R proximal biceps tendon and biceps muscle belly and were addressed with manual therapy. Reviewed AAROM exercises with wand with patient again requiring cues to avoid pushing into painful range. Introduced periscapular strengthening with banded resistance with cues for scapular retraction and depression. Updated HEP with banded row as patient tolerated this well. Patient declined modalities and without complaints at end of session.    Comorbidities  polycythemia vera, HTN, hemochromatosis, gout, CHF, cardiomyopathy, back pain, 2nd degree AV block, lumbar spinal lami/decompression, pacemaker, ankle surgery    PT Treatment/Interventions  ADLs/Self Care Home Management;Cryotherapy;Iontophoresis 12m/ml Dexamethasone;Moist Heat;Therapeutic exercise;Therapeutic activities;Functional mobility training;Neuromuscular re-education;Patient/family education;Manual techniques;Taping;Energy conservation;Dry needling;Passive range of motion    PT Next Visit Plan  monitor patient's response to ther-ex- reminder ot avoid pushing into pain    Consulted and Agree with Plan of Care  Patient       Patient will benefit from skilled therapeutic intervention in order to improve the following deficits and impairments:  Increased edema, Decreased activity tolerance, Decreased strength, Impaired UE functional use, Pain, Increased fascial restricitons, Increased muscle spasms, Improper body mechanics, Decreased range of motion, Impaired flexibility, Postural dysfunction  Visit Diagnosis: Chronic right shoulder pain  Stiffness of right shoulder, not elsewhere classified  Muscle weakness (generalized)  Abnormal posture     Problem List Patient Active Problem List    Diagnosis Date Noted  . CHF (congestive heart failure) ([place] 07/17/2017  . COPD (chronic obstructive pulmonary disease) ([place] 06/14/2017  . Acute renal failure (ARF) ([facility] 05/27/2016  . Hyponatremia 05/27/2016  . Hypokalemia 05/27/2016  . AKI (acute kidney injury) ([facility] [place] 05/26/2016  . Depression 04/05/2016  . Kidney stones 04/05/2016  . Osteoarthritis 04/05/2016  . Sleep apnea 04/05/2016  . Bilateral carpal tunnel syndrome 12/02/2015  . Chronic combined systolic and diastolic congestive heart failure ([facility] 04/13/2015  . Orthostatic hypotension 01/19/2015  . Obesity 11/26/2014  . Enlarged prostate without lower urinary tract symptoms (luts) 10/29/2014  . Essential hypertension 10/29/2014  . GERD (gastroesophageal reflux disease) 10/29/2014  . Panic attack 10/29/2014  . Changing skin lesion 10/16/2014  . Acute on chronic combined systolic and diastolic congestive heart failure ([facility] 10/14/2014  . Renal cyst 09/02/2014  . Pacemaker lead malfunction-elevated threshold RV lead 07/31/2014  . Dyspnea on exertion 06/21/2014  . Noninfective gastroenteritis and colitis 06/15/2014  . 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 Palpitations 06/11/2014  . 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  . Other cardiomyopathies ([facility] 04/29/2014  . Bradycardia 04/28/2014  . Chronic pain 02/12/2014  . Thrombocythemia ([place] 12/01/2013  . Muscular wasting and disuse atrophy 08/15/2013  . Lumbar and sacral osteoarthritis 05/19/2013  . Congestive dilated cardiomyopathy ([facility] 01/11/2012  . Compulsive tobacco user syndrome 12/13/2011  . Current tobacco use 12/13/2011  . Chest tightness   . Rash   . Back pain   . Hemochromatosis   . SOB (shortness of breath)   . Gout   . Polycythemia vera ([facility] 03/27/2011     YJanene Harvey PT, DPT 02/25/19 12:12 PM   [facility] [address]  [place], [place], [address] Phone: [phone]   Fax:   [phone]  Name: William Villanueva MRN: [id number] Date of Birth: November 23, 1950

## 2019-02-28 ENCOUNTER — Other Ambulatory Visit: Payer: Self-pay

## 2019-02-28 ENCOUNTER — Ambulatory Visit: Payer: PPO

## 2019-02-28 DIAGNOSIS — M25511 Pain in right shoulder: Secondary | ICD-10-CM | POA: Diagnosis not present

## 2019-02-28 DIAGNOSIS — G8929 Other chronic pain: Secondary | ICD-10-CM

## 2019-02-28 DIAGNOSIS — M25611 Stiffness of right shoulder, not elsewhere classified: Secondary | ICD-10-CM

## 2019-02-28 DIAGNOSIS — M6281 Muscle weakness (generalized): Secondary | ICD-10-CM

## 2019-02-28 DIAGNOSIS — R293 Abnormal posture: Secondary | ICD-10-CM

## 2019-02-28 NOTE — Therapy (Signed)
[facility] [address]  [place], [place], [address] Phone: [phone]   Fax:  [phone]  Physical Therapy Treatment  Patient Details  Name: William Villanueva MRN: [id number] Date of Birth: May 24, 1950 Referring Provider (PT): Rhina Brackett, MD   Encounter Date: 02/28/2019  PT End of Session - 02/28/19 1033    Visit Number  3    Number of Visits  13    Date for PT Re-Evaluation  04/02/19    Authorization Type  [employer]    PT Start Time  1019    PT Stop Time  1059    PT Time Calculation (min)  40 min    Activity Tolerance  Patient tolerated treatment well    Behavior During Therapy  [facility] for tasks assessed/performed       Past Medical History:  Diagnosis Date  . AV block, 2nd degree 2015   St. Jude Allure Quadra pulse generator [id number], model PM 3242  . Back pain   . Cardiomyopathy ([place])    Nonischemic 45%.   . CHF (congestive heart failure) ([place])   . Gout   . Hemochromatosis   . Hypertension   . Hypospadias October 26, 1950   born with  . Nephrolithiasis   . Polycythemia vera(238.4)     Past Surgical History:  Procedure Laterality Date  . ANKLE SURGERY    . BI-VENTRICULAR PACEMAKER INSERTION N/A 04/29/2014   Procedure: BI-VENTRICULAR PACEMAKER INSERTION (CRT-P);  Surgeon: Deboraha Sprang, MD; Laterality: Left  St. Jude Allure Quadra pulse generator [id number], model PM [phone]  . PILONIDAL CYST EXCISION    . POSTERIOR LAMINECTOMY / DECOMPRESSION LUMBAR SPINE    . TONSILLECTOMY      There were no vitals filed for this visit.  Subjective Assessment - 02/28/19 1026    Subjective  Pt. doing well with exercise per report.    Pertinent History  polycythemia vera, HTN, hemochromatosis, gout, CHF, cardiomyopathy, back pain, 2nd degree AV block, lumbar spinal lami/decompression, pacemaker, ankle surgery    Diagnostic tests  per patient- x-ray and US done, showing partial tear of RTC    Patient Stated Goals   "I don't want it to hurt no more"    Currently in Pain?  No/denies    Pain Score  0-No pain   Pain rising to 4/10 at times with reaching   Pain Location  Shoulder    Pain Orientation  Right;Anterior    Pain Descriptors / Indicators  Sharp    Pain Type  Chronic pain    Pain Radiating Towards  at times radiating down anterior R UE    Pain Onset  More than a month ago    Pain Frequency  Intermittent    Aggravating Factors   sometimes with reaching, sometimes when sitting in recliner    Pain Relieving Factors  movements, changing positions    Multiple Pain Sites  No                       [facility] Adult PT Treatment/Exercise - 02/28/19 0001      Self-Care   Self-Care  Other Self-Care Comments    Other Self-Care Comments   Instruction on self-ball release to R rhomboids and R lower UT      Shoulder Exercises: Supine   External Rotation  Right;15 reps;AAROM;Limitations    External Rotation Limitations  with wand and elbows at side + bolster to tolerance    Flexion  Right;15 reps;AAROM    Flexion Limitations  with wand to tolerance    ABduction  Right;15 reps;AAROM;Limitations    ABduction Limitations  scaption with wand to tolerance      Shoulder Exercises: Seated   Flexion  AAROM;Right;10 reps    Flexion Limitations  wand flexion/scaption       Shoulder Exercises: Standing   Flexion  Right;5 reps;AAROM    Flexion Limitations  Discomtinued due t      Shoulder Exercises: Pulleys   Flexion  3 minutes    Flexion Limitations  cues to avoid pushing into pain    Scaption  3 minutes    Scaption Limitations  cues to avoid pushing into pain      Shoulder Exercises: Stretch   Other Shoulder Stretches  Seated rhomboids stretch x 30 sec    due to ttp and complaint of tightness in R rhomboids      Manual Therapy   Manual Therapy  Soft tissue mobilization;Myofascial release;Passive ROM    Manual therapy comments  seated    Soft tissue mobilization  STM/DTM to R rhomboids, R  UT, LS, shoulder complex     Myofascial Release  manual TPR to R rhomboids, R UT               PT Short Term Goals - 02/25/19 1210      PT SHORT TERM GOAL #1   Title  Patient to be independent with initial HEP.    Time  3    Period  Weeks    Status  Partially Met    Target Date  03/12/19        PT Long Term Goals - 02/25/19 1210      PT LONG TERM GOAL #1   Title  Patient to be independent with advanced HEP.    Time  6    Period  Weeks    Status  On-going      PT LONG TERM GOAL #2   Title  Patient to demonstrate R shoulder AROM symmetrical to opposite UE and without pain limiting.    Time  6    Period  Weeks    Status  On-going      PT LONG TERM GOAL #3   Title  Patient to demonstrate R shoulder strength >=4+/5.    Time  6    Period  Weeks    Status  On-going      PT LONG TERM GOAL #4   Title  Patient to report tolerance of lifting his grandson without R shoulder pain limiting.    Time  6    Period  Weeks    Status  On-going      PT LONG TERM GOAL #5   Title  Patient to demonstrate overhead lifting with R arm with 5lbs and good scapulohumeral rhythm.    Time  6    Period  Weeks    Status  On-going            Plan - 02/28/19 1211    Clinical Impression Statement  William Villanueva reporting no issues with HEP.  Did require minor cueing with wand scaption and ER to pinpoint appropriate motion.  Primary focus of today's session was conveying concept to pt. of "not pushing into pain", with therex movements.  Pt. heavily cued for this concept and verbalized better understanding to end session however will benefit from further skilled instruction to avoid excessive shoulder strain.  Some painful catching today in  scaption/flexion in R shoulder with eccentric return motions however this quickly subsided.  Pt. reporting he is performing ROM HEP daily.    Personal Factors and Comorbidities  Age;Comorbidity 3+;Time since onset of  injury/illness/exacerbation;Fitness;Past/Current Experience;Profession    Comorbidities  polycythemia vera, HTN, hemochromatosis, gout, CHF, cardiomyopathy, back pain, 2nd degree AV block, lumbar spinal lami/decompression, pacemaker, ankle surgery    Rehab Potential  Good    PT Treatment/Interventions  ADLs/Self Care Home Management;Cryotherapy;Iontophoresis 61m/ml Dexamethasone;Moist Heat;Therapeutic exercise;Therapeutic activities;Functional mobility training;Neuromuscular re-education;Patient/family education;Manual techniques;Taping;Energy conservation;Dry needling;Passive range of motion    PT Next Visit Plan  Continue to remind pt. to avoid pushing into pain with AAROM and therex overall (pt. still having difficulty understanding this)    Consulted and Agree with Plan of Care  Patient       Patient will benefit from skilled therapeutic intervention in order to improve the following deficits and impairments:  Increased edema, Decreased activity tolerance, Decreased strength, Impaired UE functional use, Pain, Increased fascial restricitons, Increased muscle spasms, Improper body mechanics, Decreased range of motion, Impaired flexibility, Postural dysfunction  Visit Diagnosis: Chronic right shoulder pain  Stiffness of right shoulder, not elsewhere classified  Muscle weakness (generalized)  Abnormal posture     Problem List Patient Active Problem List   Diagnosis Date Noted  . CHF (congestive heart failure) ([facility] [facility] [place] 07/17/2017  . COPD (chronic obstructive pulmonary disease) ([place] [place] 06/14/2017  . Acute renal failure (ARF) ([facility] [place] 05/27/2016  . Hyponatremia 05/27/2016  . Hypokalemia 05/27/2016  . AKI (acute kidney injury) ([place] 05/26/2016  . Depression 04/05/2016  . Kidney stones 04/05/2016  . Osteoarthritis 04/05/2016  . Sleep apnea 04/05/2016  . Bilateral carpal tunnel syndrome 12/02/2015  . Chronic combined systolic and diastolic congestive heart failure ([facility] 04/13/2015  .  Orthostatic hypotension 01/19/2015  . Obesity 11/26/2014  . Enlarged prostate without lower urinary tract symptoms (luts) 10/29/2014  . Essential hypertension 10/29/2014  . GERD (gastroesophageal reflux disease) 10/29/2014  . Panic attack 10/29/2014  . Changing skin lesion 10/16/2014  . Acute on chronic combined systolic and diastolic congestive heart failure (H[place] 10/14/2014  . Renal cyst 09/02/2014  . Pacemaker lead malfunction-elevated threshold RV lead 07/31/2014  . Dyspnea on exertion 06/21/2014  . Noninfective gastroenteritis and colitis 06/15/2014  . 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 Palpitations 06/11/2014  . 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  . Other cardiomyopathies ([facility] 04/29/2014  . Bradycardia 04/28/2014  . Chronic pain 02/12/2014  . Thrombocythemia ([facility] 12/01/2013  . Muscular wasting and disuse atrophy 08/15/2013  . Lumbar and sacral osteoarthritis 05/19/2013  . Congestive dilated cardiomyopathy ([facility] 01/11/2012  . Compulsive tobacco user syndrome 12/13/2011  . Current tobacco use 12/13/2011  . Chest tightness   . Rash   . Back pain   . Hemochromatosis   . SOB (shortness of breath)   . Gout   . Polycythemia vera ([facility] 03/27/2011    MBess Harvest PTA 02/28/19 12:16 PM   [facility] [address] [place] [address]Phone: [phone]  Fax:  [phone] Name: CAdael CulbreathMRN: [id number] of Birth: [birth year]

## 2019-03-03 ENCOUNTER — Other Ambulatory Visit: Payer: Self-pay

## 2019-03-03 ENCOUNTER — Telehealth: Payer: Self-pay

## 2019-03-03 ENCOUNTER — Inpatient Hospital Stay: Payer: PPO | Attending: Hematology & Oncology | Admitting: Hematology & Oncology

## 2019-03-03 ENCOUNTER — Inpatient Hospital Stay: Payer: PPO

## 2019-03-03 ENCOUNTER — Encounter: Payer: Self-pay | Admitting: Hematology & Oncology

## 2019-03-03 VITALS — BP 122/60 | HR 77 | Temp 96.1°F | Resp 20 | Wt 288.0 lb

## 2019-03-03 DIAGNOSIS — D45 Polycythemia vera: Secondary | ICD-10-CM

## 2019-03-03 DIAGNOSIS — I441 Atrioventricular block, second degree: Secondary | ICD-10-CM | POA: Diagnosis not present

## 2019-03-03 LAB — CBC WITH DIFFERENTIAL (CANCER CENTER ONLY)
Abs Immature Granulocytes: 0.06 10*3/uL (ref 0.00–0.07)
Basophils Absolute: 0.2 10*3/uL — ABNORMAL HIGH (ref 0.0–0.1)
Basophils Relative: 2 %
Eosinophils Absolute: 0.1 10*3/uL (ref 0.0–0.5)
Eosinophils Relative: 2 %
HCT: 43.4 % (ref 39.0–52.0)
Hemoglobin: 14.1 g/dL (ref 13.0–17.0)
Immature Granulocytes: 1 %
Lymphocytes Relative: 21 %
Lymphs Abs: 1.5 10*3/uL (ref 0.7–4.0)
MCH: 34.5 pg — ABNORMAL HIGH (ref 26.0–34.0)
MCHC: 32.5 g/dL (ref 30.0–36.0)
MCV: 106.1 fL — ABNORMAL HIGH (ref 80.0–100.0)
Monocytes Absolute: 0.6 10*3/uL (ref 0.1–1.0)
Monocytes Relative: 8 %
Neutro Abs: 4.6 10*3/uL (ref 1.7–7.7)
Neutrophils Relative %: 66 %
Platelet Count: 211 10*3/uL (ref 150–400)
RBC: 4.09 MIL/uL — ABNORMAL LOW (ref 4.22–5.81)
RDW: 15.7 % — ABNORMAL HIGH (ref 11.5–15.5)
WBC Count: 7 10*3/uL (ref 4.0–10.5)
nRBC: 0 % (ref 0.0–0.2)

## 2019-03-03 LAB — CMP (CANCER CENTER ONLY)
ALT: 13 U/L (ref 0–44)
AST: 15 U/L (ref 15–41)
Albumin: 4.2 g/dL (ref 3.5–5.0)
Alkaline Phosphatase: 55 U/L (ref 38–126)
Anion gap: 11 (ref 5–15)
BUN: 13 mg/dL (ref 8–23)
CO2: 26 mmol/L (ref 22–32)
Calcium: 8.7 mg/dL — ABNORMAL LOW (ref 8.9–10.3)
Chloride: 106 mmol/L (ref 98–111)
Creatinine: 1.02 mg/dL (ref 0.61–1.24)
GFR, Est AFR Am: >60 mL/min (ref >60)
GFR, Estimated: >60 mL/min (ref >60)
Glucose, Bld: 161 mg/dL — ABNORMAL HIGH (ref 70–99)
Potassium: 3.5 mmol/L (ref 3.5–5.1)
Sodium: 143 mmol/L (ref 135–145)
Total Bilirubin: 0.5 mg/dL (ref 0.3–1.2)
Total Protein: 6.8 g/dL (ref 6.5–8.1)

## 2019-03-03 LAB — FERRITIN: Ferritin: 12 ng/mL — ABNORMAL LOW (ref 24–336)

## 2019-03-03 LAB — IRON AND TIBC
Iron: 37 ug/dL — ABNORMAL LOW (ref 42–163)
Saturation Ratios: 10 % — ABNORMAL LOW (ref 20–55)
TIBC: 378 ug/dL (ref 202–409)
UIBC: 341 ug/dL (ref 117–376)

## 2019-03-03 NOTE — Telephone Encounter (Signed)

## 2019-03-03 NOTE — Progress Notes (Signed)
Hematology and Oncology Follow Up Visit  William Villanueva [id number] 1951/04/19 68 y.o. 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  William Villanueva is back for followup.  He is doing well.  He is managing the coronavirus.  He and his wife are just not going anywhere.    We now have a new great grandson.  He was born last Wednesday.  They are very excited about this.  His heart is doing okay.  He goes to see the cardiologist regarding his pacemaker I think in a couple months.  He has not been phlebotomized for quite a while.  We have done really well with the polycythemia.  The Hydrea certainly has made a big difference.  He has had no increase in back problems.  There is been no change in bowel or bladder habits.  He has had no cough or shortness of breath.  There has been no rashes.  He has had no leg swelling.    Overall, his performance status is ECOG 1.    Medications:  Current Outpatient Medications:  .  aspirin 81 MG tablet, Take 81 mg by mouth daily., Disp: , Rfl:  .  BD PEN NEEDLE NANO U/F 32G X 4 MM MISC, , Disp: , Rfl:  .  carvedilol (COREG) 12.5 MG tablet, TAKE ONE TABLET BY MOUTH TWO TIMES A DAY WITH A MEAL, Disp: 180 tablet, Rfl: 2 .  citalopram (CELEXA) 10 MG tablet, Take 5 mg by mouth daily. , Disp: , Rfl:  .  ENTRESTO 24-26 MG, TAKE ONE TABLET BY MOUTH TWICE A DAY, Disp: 60 tablet, Rfl: 6 .  febuxostat (ULORIC) 40 MG tablet, Take 80 mg by mouth daily., Disp: , Rfl:  .  Febuxostat 80 MG TABS, Take 80 mg by mouth daily., Disp: , Rfl:  .  folic acid (FOLVITE) 1 MG tablet, Take 1 mg by mouth daily., Disp: , Rfl:  .  furosemide (LASIX) 20 MG tablet, TAKE ONE TABLET BY MOUTH DAILY, Disp: 60 tablet, Rfl: 8 .  HYDROcodone-acetaminophen (NORCO/VICODIN)  5-325 MG tablet, Take 1 tablet by mouth every 4 (four) hours as needed., Disp: 10 tablet, Rfl: 0 .  hydroxyurea (HYDREA) 500 MG capsule, Take 2 capsules (1,000 mg total) by mouth daily., Disp: 90 capsule, Rfl: 6 .  mesalamine (APRISO) 0.375 g 24 hr capsule, Take 375 mg by mouth daily. , Disp: , Rfl:  .  Methotrexate Sodium (METHOTREXATE, PF,) 250 MG/10ML injection, Inject 6 mg into the vein once a week., Disp: , Rfl:  .  omeprazole (PRILOSEC) 20 MG capsule, Take 20 mg by mouth 2 (two) times daily before a meal. , Disp: , Rfl:  .  Probiotic Product (ALIGN PO), Take 1 capsule by mouth daily. , Disp: , Rfl:  .  Spacer/Aero-Holding Chambers (AEROCHAMBER MV) inhaler, Use as instructed, Disp: 1 each, Rfl: 0 .  spironolactone (ALDACTONE) 25 MG tablet, Take 0.5 tablets (12.5 mg total) by mouth daily., Disp: 45 tablet, Rfl: 3 .  tamsulosin (FLOMAX) 0.4 MG CAPS capsule, Take 0.4 mg by mouth., Disp: , Rfl:  No current facility-administered medications for this visit.   Facility-Administered Medications Ordered in Other Visits:  .  0.9 %  sodium chloride infusion, , Intravenous, Continuous, Keylor Rands, Rudell Cobb, MD, Stopped  at 05/16/13 1115  Allergies:  Allergies  Allergen Reactions  . Bupropion Hives  . Fluoxetine Itching  . Ibuprofen Itching  . Prednisone Itching and Other (See Comments)    Abdominal pain Other reaction(s): Abdominal Pain   . Temazepam Other (See Comments)    Dizziness   . Trazodone And Nefazodone Other (See Comments)    Dizziness  . Cephalexin Diarrhea    Caused C-diff Caused C-diff   . Fluoxetine Hcl Itching  . Prozac [Fluoxetine Hcl] Itching    Past Medical History, Surgical history, Social history, and Family History were reviewed and updated.  Review of Systems: Review of Systems  Constitutional: Negative.   HENT: Negative.   Eyes: Negative.   Respiratory: Positive for shortness of breath.   Cardiovascular: Positive for chest pain.  Gastrointestinal: Negative.    Genitourinary: Negative.   Musculoskeletal: Positive for back pain.  Skin: Negative.   Neurological: Negative.   Endo/Heme/Allergies: Negative.   Psychiatric/Behavioral: Negative.     Physical Exam:  weight is 288 lb (130.6 kg). His temporal temperature is 96.1 F (35.6 C) (abnormal). His blood pressure is 122/60 and his pulse is 77. His respiration is 20 and oxygen saturation is 97%.   Physical Exam Vitals signs reviewed.  HENT:     Head: Normocephalic and atraumatic.  Eyes:     Pupils: Pupils are equal, round, and reactive to light.  Neck:     Musculoskeletal: Normal range of motion.  Cardiovascular:     Rate and Rhythm: Normal rate and regular rhythm.     Heart sounds: Normal heart sounds.  Pulmonary:     Effort: Pulmonary effort is normal.     Breath sounds: Normal breath sounds.  Abdominal:     General: Bowel sounds are normal.     Palpations: Abdomen is soft.  Musculoskeletal: Normal range of motion.        General: No tenderness or deformity.  Lymphadenopathy:     Cervical: No cervical adenopathy.  Skin:    General: Skin is warm and dry.     Findings: No erythema or rash.  Neurological:     Mental Status: He is alert and oriented to person, place, and time.  Psychiatric:        Behavior: Behavior normal.        Thought Content: Thought content normal.        Judgment: Judgment normal.      Lab Results  Component Value Date   WBC 7.0 03/03/2019   HGB 14.1 03/03/2019   HCT 43.4 03/03/2019   MCV 106.1 (H) 03/03/2019   PLT 211 03/03/2019     Chemistry      Component Value Date/Time   NA 140 11/26/2018 0846   NA 141 04/10/2017 0923   NA 140 04/05/2016 0739   K 4.0 11/26/2018 0846   K 3.5 04/10/2017 0923   K 4.2 04/05/2016 0739   CL 106 11/26/2018 0846   CL 108 04/10/2017 0923   CO2 25 11/26/2018 0846   CO2 24 04/10/2017 0923   CO2 20 (L) 04/05/2016 0739   BUN 15 11/26/2018 0846   BUN 12 04/10/2017 0923   BUN 14.7 04/05/2016 0739   CREATININE  0.96 11/26/2018 0846   CREATININE 1.1 04/10/2017 0923   CREATININE 1.1 04/05/2016 0739      Component Value Date/Time   CALCIUM 8.0 (L) 11/26/2018 0846   CALCIUM 8.4 04/10/2017 0923   CALCIUM 9.0 04/05/2016 0739   ALKPHOS 62 11/26/2018 0846  ALKPHOS 77 04/10/2017 0923   ALKPHOS 91 04/05/2016 0739   AST 14 (L) 11/26/2018 0846   AST 34 04/05/2016 0739   ALT 12 11/26/2018 0846   ALT 32 04/10/2017 0923   ALT 40 04/05/2016 0739   BILITOT 0.6 11/26/2018 0846   BILITOT 0.48 04/05/2016 0739      Impression and Plan: Mr. Huckeby is 68 year old gentleman with polycythemia vera.  He does not need to be phlebotomized today.   I am just very happy about their second great-grandson.  They really enjoyed their first great-grandson.  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  I think we can get him through all of the holiday season now.  I will get him back in January.    Volanda Napoleon, MD 10/26/20208:34 AM

## 2019-03-04 ENCOUNTER — Telehealth (INDEPENDENT_AMBULATORY_CARE_PROVIDER_SITE_OTHER): Payer: PPO | Admitting: Internal Medicine

## 2019-03-04 VITALS — BP 113/47 | HR 75 | Wt 287.0 lb

## 2019-03-04 DIAGNOSIS — I428 Other cardiomyopathies: Secondary | ICD-10-CM

## 2019-03-04 DIAGNOSIS — Z950 Presence of cardiac pacemaker: Secondary | ICD-10-CM | POA: Diagnosis not present

## 2019-03-04 DIAGNOSIS — I420 Dilated cardiomyopathy: Secondary | ICD-10-CM | POA: Diagnosis not present

## 2019-03-04 NOTE — Patient Instructions (Signed)
Medication Instructions:  No changes *If you need a refill on your cardiac medications before your next appointment, please call your pharmacy*  Lab Work: none If you have labs (blood work) drawn today and your tests are completely normal, you will receive your results only by: [name] MyChart Message (if you have MyChart) OR . A paper copy in the mail If you have any lab test that is abnormal or we need to change your treatment, we will call you to review the results.  Testing/Procedures: none  Follow-Up:  A remote check of your device will be arranged as soon as possible.  At [facility], you and your health needs are our priority.  As part of our continuing mission to provide you with exceptional heart care, we have created designated Provider Care Teams.  These Care Teams include your primary Cardiologist (physician) and Advanced Practice Providers (APPs -  Physician Assistants and Nurse Practitioners) who all work together to provide you with the care you need, when you need it.  Your next appointment:   12 months  The format for your next appointment:   In Person  Provider:   [name], MD  Other Instructions

## 2019-03-04 NOTE — Progress Notes (Signed)
Electrophysiology TeleHealth Note   Due to national recommendations of social distancing due to COVID 19, an audio/video telehealth visit is felt to be most appropriate for this patient at this time.  See MyChart message from today for the patient's consent to telehealth for [facility].   Date:  03/04/2019   ID:  William Villanueva, DOB 05-Feb-1951, MRN [id number]  Location: patient's home  Provider location: [address] [place]  Evaluation Performed: Follow-up visit  PCP:  Antony Contras, MD  Cardiologist:   [facility] Electrophysiologist:  SK   Chief Complaint:  Neck pain  History of Present Illness:    William Villanueva is a 68 y.o. male who presents via audio/video conferencing for a telehealth visit today.  Since last being seen in our clinic congestive heart failure, nonischemic cardiomyopathy and previously implanted CRT-P, the patient reports chronic limiting shortness of breath without peripheral edema.  He is untreated sleep apnea.  He also describes an electrical shock that went up from his pacemaker early September up towards his ear.  There are some discomfort that persisted for a couple of days.  No other affects since that time.     DATE TEST EF   2/16 Echo  50-55   3/17 MYOVIEW 32%   1/20 Echo  45-50%        Date Cr K Hgb  9/19 1.44 4.1 13.7  10/20  1.02 3.5 14.1     The patient denies symptoms of fevers, chills, cough, or new SOB worrisome for COVID 19.   Past Medical History:  Diagnosis Date  . AV block, 2nd degree 2015   St. Jude Allure Quadra pulse generator [id number], model PM 3242  . Back pain   . Cardiomyopathy ([place])    Nonischemic 45%.   . CHF (congestive heart failure) ([place])   . Gout   . Hemochromatosis   . Hypertension   . Hypospadias May 25, 1950   born with  . Nephrolithiasis   . Polycythemia vera(238.4)     Past Surgical History:  Procedure Laterality Date  . ANKLE SURGERY    . BI-VENTRICULAR PACEMAKER INSERTION N/A  04/29/2014   Procedure: BI-VENTRICULAR PACEMAKER INSERTION (CRT-P);  Surgeon: Deboraha Sprang, MD; Laterality: Left  St. Jude Allure Quadra pulse generator [phone], model PM [phone]  . PILONIDAL CYST EXCISION    . POSTERIOR LAMINECTOMY / DECOMPRESSION LUMBAR SPINE    . TONSILLECTOMY      Current Outpatient Medications  Medication Sig Dispense Refill  . aspirin 81 MG tablet Take 81 mg by mouth daily.    . BD PEN NEEDLE NANO U/F 32G X 4 MM MISC     . carvedilol (COREG) 12.5 MG tablet TAKE ONE TABLET BY MOUTH TWO TIMES A DAY WITH A MEAL 180 tablet 2  . citalopram (CELEXA) 10 MG tablet Take 5 mg by mouth daily.     [name] ENTRESTO 24-26 MG TAKE ONE TABLET BY MOUTH TWICE A DAY 60 tablet 6  . febuxostat (ULORIC) 40 MG tablet Take 80 mg by mouth daily.    . Febuxostat 80 MG TABS Take 80 mg by mouth daily.    . folic acid (FOLVITE) 1 MG tablet Take 1 mg by mouth daily.    . furosemide (LASIX) 20 MG tablet TAKE ONE TABLET BY MOUTH DAILY 60 tablet 8  . HYDROcodone-acetaminophen (NORCO/VICODIN) 5-325 MG tablet Take 1 tablet by mouth every 4 (four) hours as needed. 10 tablet 0  . hydroxyurea (HYDREA) 500 MG capsule  Take 2 capsules (1,000 mg total) by mouth daily. 90 capsule 6  . mesalamine (APRISO) 0.375 g 24 hr capsule Take 375 mg by mouth daily.     . Methotrexate Sodium (METHOTREXATE, PF,) 250 MG/10ML injection Inject 6 mg into the vein once a week.    [name] omeprazole (PRILOSEC) 20 MG capsule Take 20 mg by mouth 2 (two) times daily before a meal.     . Probiotic Product (ALIGN PO) Take 1 capsule by mouth daily.     [name] Spacer/Aero-Holding Chambers (AEROCHAMBER MV) inhaler Use as instructed 1 each 0  . spironolactone (ALDACTONE) 25 MG tablet Take 0.5 tablets (12.5 mg total) by mouth daily. 45 tablet 3  . tamsulosin (FLOMAX) 0.4 MG CAPS capsule Take 0.4 mg by mouth.     No current facility-administered medications for this visit.    Facility-Administered Medications Ordered in Other Visits  Medication Dose  Route Frequency Provider Last Rate Last Dose  . 0.9 %  sodium chloride infusion   Intravenous Continuous Volanda Napoleon, MD   Stopped at 05/16/13 1115    Allergies:   Bupropion, Fluoxetine, Ibuprofen, Prednisone, Temazepam, Trazodone and nefazodone, Cephalexin, Fluoxetine hcl, and Prozac [fluoxetine hcl]   Social History:  The patient  reports that he quit smoking about 5 years ago. His smoking use included cigarettes. He started smoking about 50 years ago. He has a 44.00 pack-year smoking history. He has never used smokeless tobacco. He reports current alcohol use. He reports that he does not use drugs.   Family History:  The patient's   family history includes Diabetes in his sister; Heart disease in his maternal grandmother; Hypertension in his sister; Other in his father; Stroke in his mother.   ROS:  Please see the history of present illness.   All other systems are personally reviewed and negative.    Exam:    Vital Signs:  BP (!) 113/47   Pulse 75   Wt 287 lb (130.2 kg)   BMI 41.18 kg/m     Well appearing, alert and conversant, regular work of breathing,  good skin color Eyes- anicteric, neuro- grossly intact, skin- no apparent rash or lesions or cyanosis, mouth- oral mucosa is pink   Labs/Other Tests and Data Reviewed:    Recent Labs: 03/03/2019: ALT 13; BUN 13; Creatinine 1.02; Hemoglobin 14.1; Platelet Count 211; Potassium 3.5; Sodium 143   Wt Readings from Last 3 Encounters:  03/04/19 287 lb (130.2 kg)  03/03/19 288 lb (130.6 kg)  01/06/19 286 lb (129.7 kg)     Other studies personally reviewed: Additional studies/ records that were reviewed today include:As above  Last device remote is reviewed from [place] PDF dated 8/20  which reveals normal device function,   arrhythmias - atrial tachycardia    ASSESSMENT & PLAN:    NICM  CHF chronic sys/HFpFF  Hypertension  Morbidly obese   Pacemaker-CRT-St. Jude       Neck Pain  The patient's biggest  concern is the shock sensation in the facility of his pacemaker.  I strongly suspect it is unrelated to his pacemaker and much more likely transient neurological pain.  We will undertake a transmission to look for evidence of current leak.  He is euvolemic by history.  I think he is largely deconditioned; given his obesity had described an exercise program where he would incrementally increase his walking by 1 minute a week and undertaking three 5-minute walks a day to begin with.   COVID 19 screen The  patient denies symptoms of COVID 19 at this time.  The importance of social distancing was discussed today.  Follow-up:  16mNext remote: ASAP  Current medicines are reviewed at length with the patient today.   The patient does not have concerns regarding his medicines.  The following changes were made today:  none  Labs/ tests ordered today include:   No orders of the defined types were placed in this encounter.   Future tests ( post COVID )     Patient Risk:  after full review of this patients clinical status, I feel that they are at moderate risk at this time.  Today, I have spent 10 minutes with the patient with telehealth technology discussing the above.  Signed, SVirl Axe MD  03/04/2019 3:08 PM     [place] [phone]) [phone](fax)

## 2019-03-05 ENCOUNTER — Telehealth: Payer: Self-pay

## 2019-03-05 NOTE — Telephone Encounter (Signed)
Manual transmission received and reviewed. Normal device function. Presenting rhythm AS/BiVP @ 130bpm (max track rate). 82 mode switches (<1%)--AT, longest 62min. Histograms appropriate. Lead trends stable overall, ACap trend out of range at times, current threshold measured 1.75V @ 0.34ms, output adapted to 3.25V. Recommend fixing output at next OV.  Spoke with patient. Transmission requested by Dr. Caryl Comes as pt noted a "shock" sensation from PPM site up his chest and left neck in early September, lasted a few days. No episodes correlate with this time period. Pt has not experienced these symptoms since then.  Asked pt about his activities at time of transmission as his presenting HR is elevated. Pt reports he had been working in his yard and had to come into the house to answer our phone call and to send the transmission. He was asymptomatic with this HR. Advised pt I will forward information to Dr. Caryl Comes for review. Will call if any recommended changes. Pt verbalizes understanding and agreement with plan. No further questions at this time.

## 2019-03-05 NOTE — Telephone Encounter (Signed)
-----   Message from Deboraha Sprang, MD sent at 03/04/2019  3:39 PM EDT ----- Salome Spotted  can you arrange a remote transmission on this man plx Thanks DK

## 2019-03-05 NOTE — Telephone Encounter (Signed)
I called the pt to get a transmission per Dr. Caryl Comes request. Transmission received. I told him a nurse will review it and give him a call back.

## 2019-03-06 ENCOUNTER — Encounter: Payer: PPO | Admitting: Physical Therapy

## 2019-03-07 ENCOUNTER — Encounter: Payer: PPO | Admitting: Physical Therapy

## 2019-03-07 NOTE — Telephone Encounter (Signed)
Noted   No changes Raquel Sarna Thanks SK

## 2019-03-10 ENCOUNTER — Other Ambulatory Visit: Payer: Self-pay

## 2019-03-10 ENCOUNTER — Encounter: Payer: Self-pay | Admitting: Physical Therapy

## 2019-03-10 ENCOUNTER — Ambulatory Visit: Payer: PPO | Attending: Family Medicine | Admitting: Physical Therapy

## 2019-03-10 DIAGNOSIS — G8929 Other chronic pain: Secondary | ICD-10-CM | POA: Diagnosis not present

## 2019-03-10 DIAGNOSIS — M6281 Muscle weakness (generalized): Secondary | ICD-10-CM | POA: Diagnosis not present

## 2019-03-10 DIAGNOSIS — M25511 Pain in right shoulder: Secondary | ICD-10-CM | POA: Diagnosis not present

## 2019-03-10 DIAGNOSIS — M25611 Stiffness of right shoulder, not elsewhere classified: Secondary | ICD-10-CM | POA: Diagnosis not present

## 2019-03-10 DIAGNOSIS — R293 Abnormal posture: Secondary | ICD-10-CM | POA: Diagnosis not present

## 2019-03-10 NOTE — Therapy (Signed)
[facility] [address]  [place], [place], [address] Phone: [phone]   Fax:  [phone]  Physical Therapy Treatment  Patient Details  Name: William Villanueva MRN: [id number] Date of Birth: [birth year] Referring Provider (PT): Rhina Brackett, MD    Encounter Date: 03/10/2019  PT End of Session - 03/10/19 1150    Visit Number  4    Number of Visits  13    Date for PT Re-Evaluation  04/02/19    Authorization Type  [employer]    PT Start Time  1104    PT Stop Time  1143    PT Time Calculation (min)  39 min    Activity Tolerance  Patient tolerated treatment well    Behavior During Therapy  [facility] for tasks assessed/performed       Past Medical History:  Diagnosis Date  . AV block, 2nd degree 2015   St. Jude Allure Quadra pulse generator [id number], model PM 3242  . Back pain   . Cardiomyopathy ([place])    Nonischemic 45%.   . CHF (congestive heart failure) ([place])   . Gout   . Hemochromatosis   . Hypertension   . Hypospadias February 14, 1951   born with  . Nephrolithiasis   . Polycythemia vera(238.4)     Past Surgical History:  Procedure Laterality Date  . ANKLE SURGERY    . BI-VENTRICULAR PACEMAKER INSERTION N/A 04/29/2014   Procedure: BI-VENTRICULAR PACEMAKER INSERTION (CRT-P);  Surgeon: Deboraha Sprang, MD; Laterality: Left  St. Jude Allure Quadra pulse generator [phone], model PM [phone]  . PILONIDAL CYST EXCISION    . POSTERIOR LAMINECTOMY / DECOMPRESSION LUMBAR SPINE    . TONSILLECTOMY      There were no vitals filed for this visit.  Subjective Assessment - 03/10/19 1104    Subjective  Went on vacation last week. Shoulder hasn't been feeling too bad but his whole body was feeling sore after driving 3 hours.    Pertinent History  polycythemia vera, HTN, hemochromatosis, gout, CHF, cardiomyopathy, back pain, 2nd degree AV block, lumbar spinal lami/decompression, pacemaker, ankle surgery    Diagnostic tests  per  patient- x-ray and US done, showing partial tear of RTC    Patient Stated Goals  "I don't want it to hurt no more"    Currently in Pain?  No/denies                       [facility] Adult PT Treatment/Exercise - 03/10/19 0001      Shoulder Exercises: Supine   Protraction  Strengthening;Right;10 reps;Weights    Protraction Weight (lbs)  1    Protraction Limitations  2x10; serratus punches    cues to maintain elbows straight   Flexion  Strengthening;Right;10 reps;Weights    Shoulder Flexion Weight (lbs)  1    Flexion Limitations  good ROM      Shoulder Exercises: Sidelying   External Rotation  Strengthening;Right;10 reps    External Rotation Weight (lbs)  1    External Rotation Limitations  cues for slight scap retraction; c/o muscle burn    ABduction  Strengthening;Right;10 reps;Weights    ABduction Weight (lbs)  1    ABduction Limitations  thumb up, to ~100 deg to tolerance      Shoulder Exercises: Standing   External Rotation  Strengthening;Right;10 reps;Theraband    Theraband Level (Shoulder External Rotation)  Level 2 (Red)    External Rotation Weight (lbs)  isometric step  outs with cues to maintain neutral    Internal Rotation  Strengthening;Right;10 reps;Theraband    Theraband Level (Shoulder Internal Rotation)  Level 2 (Red)    Internal Rotation Limitations  isometric step outs with cues to maintain neutral    Row  Strengthening;Both;10 reps;Theraband    Theraband Level (Shoulder Row)  Level 2 (Red)    Row Limitations  heavy cues for correction of form      Shoulder Exercises: Pulleys   Flexion  3 minutes    Flexion Limitations  to tolerance    Scaption  3 minutes    Scaption Limitations  to tolerance      Shoulder Exercises: Stretch   Corner Stretch  2 reps;30 seconds    Corner Stretch Limitations  R 90/90 stretch in doorway    Other Shoulder Stretches  R shoulder ER/IR stretch with strap 10x5" to tolerance             PT Education - 03/10/19  1150    Education Details  update to HEP    Person(s) Educated  Patient    Methods  Explanation;Demonstration;Tactile cues;Verbal cues;Handout    Comprehension  Returned demonstration;Verbalized understanding       PT Short Term Goals - 02/25/19 1210      PT SHORT TERM GOAL #1   Title  Patient to be independent with initial HEP.    Time  3    Period  Weeks    Status  Partially Met    Target Date  03/12/19        PT Long Term Goals - 02/25/19 1210      PT LONG TERM GOAL #1   Title  Patient to be independent with advanced HEP.    Time  6    Period  Weeks    Status  On-going      PT LONG TERM GOAL #2   Title  Patient to demonstrate R shoulder AROM symmetrical to opposite UE and without pain limiting.    Time  6    Period  Weeks    Status  On-going      PT LONG TERM GOAL #3   Title  Patient to demonstrate R shoulder strength >=4+/5.    Time  6    Period  Weeks    Status  On-going      PT LONG TERM GOAL #4   Title  Patient to report tolerance of lifting his grandson without R shoulder pain limiting.    Time  6    Period  Weeks    Status  On-going      PT LONG TERM GOAL #5   Title  Patient to demonstrate overhead lifting with R arm with 5lbs and good scapulohumeral rhythm.    Time  6    Period  Weeks    Status  On-going            Plan - 03/10/19 1150    Clinical Impression Statement  Patient reporting no new complaints. Notes that he feels like his shoulder is getting better and not hurting as much in PM.  Increased weighted resistance with serratus anterior strengthening with patient requiring frequent cues to maintain straight elbows. Demonstrated good ROM with supine flexion with light weighted resistance. Did c/o intermittent muscle burn/fatigue throughout ther-ex but tolerated this well. Able to perform sidelying abduction with light weight as well, but with slightly limited ROM demonstrated. Patient reported that he did not like how banded rows made  his  shoulder feel, thus reviewed this exercise. Patient required heavy cues to correct form and reported no issues after correction of form. Reported shoulder fatigue after banded strengthening ther-ex but no pain. Updated HEP with strengthening exercises that were well-tolerated today as patient has been demonstrating good ROM throughout session. Patient reported understanding and with no complaints at end of session.    Comorbidities  polycythemia vera, HTN, hemochromatosis, gout, CHF, cardiomyopathy, back pain, 2nd degree AV block, lumbar spinal lami/decompression, pacemaker, ankle surgery    Rehab Potential  Good    PT Treatment/Interventions  ADLs/Self Care Home Management;Cryotherapy;Iontophoresis 60m/ml Dexamethasone;Moist Heat;Therapeutic exercise;Therapeutic activities;Functional mobility training;Neuromuscular re-education;Patient/family education;Manual techniques;Taping;Energy conservation;Dry needling;Passive range of motion    PT Next Visit Plan  Continue to remind pt. to avoid pushing into pain with AAROM and therex overall (pt. still having difficulty understanding this)    Consulted and Agree with Plan of Care  Patient       Patient will benefit from skilled therapeutic intervention in order to improve the following deficits and impairments:  Increased edema, Decreased activity tolerance, Decreased strength, Impaired UE functional use, Pain, Increased fascial restricitons, Increased muscle spasms, Improper body mechanics, Decreased range of motion, Impaired flexibility, Postural dysfunction  Visit Diagnosis: Chronic right shoulder pain  Stiffness of right shoulder, not elsewhere classified  Muscle weakness (generalized)  Abnormal posture     Problem List Patient Active Problem List   Diagnosis Date Noted  . CHF (congestive heart failure) ([place] 07/17/2017  . COPD (chronic obstructive pulmonary disease) ([place] 06/14/2017  . Acute renal failure (ARF) ([place] 05/27/2016  .  Hyponatremia 05/27/2016  . Hypokalemia 05/27/2016  . AKI (acute kidney injury) ([place] 05/26/2016  . Depression 04/05/2016  . Kidney stones 04/05/2016  . Osteoarthritis 04/05/2016  . Sleep apnea 04/05/2016  . Bilateral carpal tunnel syndrome 12/02/2015  . Chronic combined systolic and diastolic congestive heart failure ([place] 04/13/2015  . Orthostatic hypotension 01/19/2015  . Obesity 11/26/2014  . Enlarged prostate without lower urinary tract symptoms (luts) 10/29/2014  . Essential hypertension 10/29/2014  . GERD (gastroesophageal reflux disease) 10/29/2014  . Panic attack 10/29/2014  . Changing skin lesion 10/16/2014  . Acute on chronic combined systolic and diastolic congestive heart failure ([place] 10/14/2014  . Renal cyst 09/02/2014  . Pacemaker lead malfunction-elevated threshold RV lead 07/31/2014  . Dyspnea on exertion 06/21/2014  . Noninfective gastroenteritis and colitis 06/15/2014  . 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 Palpitations 06/11/2014  . 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  . Other cardiomyopathies ([place] 04/29/2014  . Bradycardia 04/28/2014  . Chronic pain 02/12/2014  . Thrombocythemia ([place] 12/01/2013  . Muscular wasting and disuse atrophy 08/15/2013  . Lumbar and sacral osteoarthritis 05/19/2013  . Congestive dilated cardiomyopathy ([place] 01/11/2012  . Compulsive tobacco user syndrome 12/13/2011  . Current tobacco use 12/13/2011  . Chest tightness   . Rash   . Back pain   . Hemochromatosis   . SOB (shortness of breath)   . Gout   . Polycythemia vera ([place] 03/27/2011     YJanene Harvey PT, DPT 03/10/19 11:52 AM   [facility] [place] [address]Phone: [phone]  Fax:  [phone] Name: CYasmin DibelloMRN: [id number] of Birth: 212/31/1952

## 2019-03-13 ENCOUNTER — Other Ambulatory Visit: Payer: Self-pay

## 2019-03-13 ENCOUNTER — Ambulatory Visit: Payer: PPO

## 2019-03-13 DIAGNOSIS — M25511 Pain in right shoulder: Secondary | ICD-10-CM | POA: Diagnosis not present

## 2019-03-13 DIAGNOSIS — G8929 Other chronic pain: Secondary | ICD-10-CM

## 2019-03-13 DIAGNOSIS — R293 Abnormal posture: Secondary | ICD-10-CM

## 2019-03-13 DIAGNOSIS — M6281 Muscle weakness (generalized): Secondary | ICD-10-CM

## 2019-03-13 DIAGNOSIS — M25611 Stiffness of right shoulder, not elsewhere classified: Secondary | ICD-10-CM

## 2019-03-13 NOTE — Therapy (Signed)
[facility] [address]  [place], [place], [address] Phone: [phone]   Fax:  [phone]  Physical Therapy Treatment  Patient Details  Name: William Villanueva MRN: [id number] Date of Birth: 12-Mar-1951 Referring Provider (PT): Rhina Brackett, MD   Encounter Date: 03/13/2019  PT End of Session - 03/13/19 1112    Visit Number  5    Number of Visits  13    Date for PT Re-Evaluation  04/02/19    Authorization Type  [employer]    PT Start Time  1100    PT Stop Time  1145    PT Time Calculation (min)  45 min    Activity Tolerance  Patient tolerated treatment well    Behavior During Therapy  [facility] for tasks assessed/performed       Past Medical History:  Diagnosis Date  . AV block, 2nd degree 2015   St. Jude Allure Quadra pulse generator [id number], model PM 3242  . Back pain   . Cardiomyopathy ([place])    Nonischemic 45%.   . CHF (congestive heart failure) ([place])   . Gout   . Hemochromatosis   . Hypertension   . Hypospadias [birth year]   born with  . Nephrolithiasis   . Polycythemia vera(238.4)     Past Surgical History:  Procedure Laterality Date  . ANKLE SURGERY    . BI-VENTRICULAR PACEMAKER INSERTION N/A 04/29/2014   Procedure: BI-VENTRICULAR PACEMAKER INSERTION (CRT-P);  Surgeon: Deboraha Sprang, MD; Laterality: Left  St. Jude Allure Quadra pulse generator [phone], model PM [phone]  . PILONIDAL CYST EXCISION    . POSTERIOR LAMINECTOMY / DECOMPRESSION LUMBAR SPINE    . TONSILLECTOMY      There were no vitals filed for this visit.  Subjective Assessment - 03/13/19 1103    Subjective  Pt. reporting less frequent R shoulder pain in bed which has improved his sleep quality.    Pertinent History  polycythemia vera, HTN, hemochromatosis, gout, CHF, cardiomyopathy, back pain, 2nd degree AV block, lumbar spinal lami/decompression, pacemaker, ankle surgery    Diagnostic tests  per patient- x-ray and US done, showing  partial tear of RTC    Patient Stated Goals  "I don't want it to hurt no more"    Currently in Pain?  Yes    Pain Score  4     Pain Location  Shoulder    Pain Orientation  Right;Anterior    Pain Descriptors / Indicators  Dull;Aching    Pain Type  Chronic pain    Pain Radiating Towards  denies recently    Pain Onset  More than a month ago    Pain Frequency  Intermittent    Aggravating Factors   sleeping on R side, "when I hold the arm a Certain way"    Multiple Pain Sites  No                       [facility] Adult PT Treatment/Exercise - 03/13/19 0001      Shoulder Exercises: Supine   Protraction  Both;AAROM;15 reps    Protraction Weight (lbs)  3    Protraction Limitations  with wand in hooklying     Flexion  Right;AAROM;15 reps    Flexion Limitations  wand    ABduction  Right;15 reps;AAROM;Limitations    ABduction Limitations  scaption with wand to tolerance      Shoulder Exercises: Seated   External Rotation  Right;10 reps;AAROM  External Rotation Limitations  wand; bolster by side to maintain neutral elbow positioning       Shoulder Exercises: Standing   External Rotation  Strengthening;Right;10 reps;Theraband    Theraband Level (Shoulder External Rotation)  Level 2 (Red)    External Rotation Weight (lbs)  manually anchored with therapist moving for isometric hold for pt. in neutral position as pt. seated    attempted initially AROM band however pain thus terminated    Internal Rotation  Right;15 reps;Strengthening;Theraband    Theraband Level (Shoulder Internal Rotation)  Level 2 (Red)    Internal Rotation Limitations  AROM     Flexion  Right;5 reps;AAROM    Flexion Limitations  finger ladder     Row  Both;15 reps;Strengthening    Theraband Level (Shoulder Row)  Level 2 (Red)    Row Limitations  heavy cues for correction of form    Other Standing Exercises  R shoulder flexion wall wash x 10 reps      Shoulder Exercises: ROM/Strengthening   UBE (Upper Arm  Bike)  Lvl 1.0, 3 min forwards/3 min backwards       Manual Therapy   Manual Therapy  Soft tissue mobilization;Myofascial release;Passive ROM    Manual therapy comments  seated     Soft tissue mobilization  STM to R shoulder complex, R anterior shoulder (ttp), R posterior/inferior shoulder (teres minor infra - ttp)    Myofascial Release  manual TPR to R teres group, infra - ttp - increased muscular tension              PT Education - 03/13/19 1203    Education Details  HEP update; posterior inferior shoulder stretch    Person(s) Educated  Patient    Methods  Explanation;Demonstration;Verbal cues;Handout    Comprehension  Verbalized understanding;Returned demonstration;Verbal cues required       PT Short Term Goals - 03/13/19 1204      PT SHORT TERM GOAL #1   Title  Patient to be independent with initial HEP.    Time  3    Period  Weeks    Status  Achieved    Target Date  03/12/19        PT Long Term Goals - 02/25/19 1210      PT LONG TERM GOAL #1   Title  Patient to be independent with advanced HEP.    Time  6    Period  Weeks    Status  On-going      PT LONG TERM GOAL #2   Title  Patient to demonstrate R shoulder AROM symmetrical to opposite UE and without pain limiting.    Time  6    Period  Weeks    Status  On-going      PT LONG TERM GOAL #3   Title  Patient to demonstrate R shoulder strength >=4+/5.    Time  6    Period  Weeks    Status  On-going      PT LONG TERM GOAL #4   Title  Patient to report tolerance of lifting his grandson without R shoulder pain limiting.    Time  6    Period  Weeks    Status  On-going      PT LONG TERM GOAL #5   Title  Patient to demonstrate overhead lifting with R arm with 5lbs and good scapulohumeral rhythm.    Time  6    Period  Weeks    Status  On-going            Plan - 03/13/19 1112    Clinical Impression Statement  Pt. reporting he is now having less R shoulder pain in bed which has improved his sleep  quality.  Pt. noting his R shoulder used to hurt constantly in his recliner however is now less frequent and intermittent.  MT addressing tightness/tenderness in anterior and posterior/inferior shoulder today with pt. very TTP in Teres Minor/Infraspinatus area.  Advanced gentle scapular/RTC strengthening per pt. tolerance today.  poor tolerance for AROM ER thus utilized isometric band resisted ER activity with improved tolerance.  Ended session pain free thus modalities deferred.    Comorbidities  polycythemia vera, HTN, hemochromatosis, gout, CHF, cardiomyopathy, back pain, 2nd degree AV block, lumbar spinal lami/decompression, pacemaker, ankle surgery    Rehab Potential  Good    Consulted and Agree with Plan of Care  Patient       Patient will benefit from skilled therapeutic intervention in order to improve the following deficits and impairments:  Increased edema, Decreased activity tolerance, Decreased strength, Impaired UE functional use, Pain, Increased fascial restricitons, Increased muscle spasms, Improper body mechanics, Decreased range of motion, Impaired flexibility, Postural dysfunction  Visit Diagnosis: Chronic right shoulder pain  Stiffness of right shoulder, not elsewhere classified  Muscle weakness (generalized)  Abnormal posture     Problem List Patient Active Problem List   Diagnosis Date Noted  . CHF (congestive heart failure) ([place]) 07/17/2017  . COPD (chronic obstructive pulmonary disease) ([place]) 06/14/2017  . Acute renal failure (ARF) ([place]) 05/27/2016  . Hyponatremia 05/27/2016  . Hypokalemia 05/27/2016  . AKI (acute kidney injury) ([place]) 05/26/2016  . Depression 04/05/2016  . Kidney stones 04/05/2016  . Osteoarthritis 04/05/2016  . Sleep apnea 04/05/2016  . Bilateral carpal tunnel syndrome 12/02/2015  . Chronic combined systolic and diastolic congestive heart failure ([place]) 04/13/2015  . Orthostatic hypotension 01/19/2015  . Obesity 11/26/2014  . Enlarged  prostate without lower urinary tract symptoms (luts) 10/29/2014  . Essential hypertension 10/29/2014  . GERD (gastroesophageal reflux disease) 10/29/2014  . Panic attack 10/29/2014  . Changing skin lesion 10/16/2014  . Acute on chronic combined systolic and diastolic congestive heart failure ([place]) 10/14/2014  . Renal cyst 09/02/2014  . Pacemaker lead malfunction-elevated threshold RV lead 07/31/2014  . Dyspnea on exertion 06/21/2014  . Noninfective gastroenteritis and colitis 06/15/2014  . 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 Palpitations 06/11/2014  . 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  . Other cardiomyopathies ([place]) 04/29/2014  . Bradycardia 04/28/2014  . Chronic pain 02/12/2014  . 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 Muscular wasting and disuse atrophy 08/15/2013  . Lumbar and sacral osteoarthritis 05/19/2013  . Congestive dilated cardiomyopathy ([place]) 01/11/2012  . Compulsive tobacco user syndrome 12/13/2011  . Current tobacco use 12/13/2011  . Chest tightness   . Rash   . Back pain   . Hemochromatosis   . SOB (shortness of breath)   . Gout   . Polycythemia vera ([place]) 03/27/2011    Bess Harvest, PTA 03/13/19 1:08 PM    [facility] [address]  [place], [place], [address] Phone: [phone]   Fax:  [phone]  Name: Braylenn Mohammadi MRN: [id number] Date of Birth: July 16, 1950

## 2019-03-17 ENCOUNTER — Ambulatory Visit: Payer: PPO

## 2019-03-17 ENCOUNTER — Other Ambulatory Visit: Payer: Self-pay

## 2019-03-17 DIAGNOSIS — M25511 Pain in right shoulder: Secondary | ICD-10-CM | POA: Diagnosis not present

## 2019-03-17 DIAGNOSIS — M25611 Stiffness of right shoulder, not elsewhere classified: Secondary | ICD-10-CM

## 2019-03-17 DIAGNOSIS — R293 Abnormal posture: Secondary | ICD-10-CM

## 2019-03-17 DIAGNOSIS — G8929 Other chronic pain: Secondary | ICD-10-CM

## 2019-03-17 DIAGNOSIS — M6281 Muscle weakness (generalized): Secondary | ICD-10-CM

## 2019-03-17 NOTE — Therapy (Signed)
[facility] [address]  [place], [place], [address] Phone: [phone]   Fax:  [phone]  Physical Therapy Treatment  Patient Details  Name: William Villanueva MRN: [id number] Date of Birth: 1950/06/12 Referring Provider (PT): Rhina Brackett, MD   Encounter Date: 03/17/2019  PT End of Session - 03/17/19 0950    Visit Number  6    Number of Visits  13    Date for PT Re-Evaluation  04/02/19    Authorization Type  [employer]    PT Start Time  0933    PT Stop Time  1011    PT Time Calculation (min)  38 min    Activity Tolerance  Patient tolerated treatment well    Behavior During Therapy  [facility] for tasks assessed/performed       Past Medical History:  Diagnosis Date  . AV block, 2nd degree 2015   St. Jude Allure Quadra pulse generator [id number], model PM 3242  . Back pain   . Cardiomyopathy ([place])    Nonischemic 45%.   . CHF (congestive heart failure) ([place])   . Gout   . Hemochromatosis   . Hypertension   . Hypospadias [birth year]   born with  . Nephrolithiasis   . Polycythemia vera(238.4)     Past Surgical History:  Procedure Laterality Date  . ANKLE SURGERY    . BI-VENTRICULAR PACEMAKER INSERTION N/A 04/29/2014   Procedure: BI-VENTRICULAR PACEMAKER INSERTION (CRT-P);  Surgeon: Deboraha Sprang, MD; Laterality: Left  St. Jude Allure Quadra pulse generator [id number], model PM [phone]  . PILONIDAL CYST EXCISION    . POSTERIOR LAMINECTOMY / DECOMPRESSION LUMBAR SPINE    . TONSILLECTOMY      There were no vitals filed for this visit.  Subjective Assessment - 03/17/19 0945    Subjective  Pt. noting shoulder felt ok over weend.  Did have some R lower back > R armpit pain for two daysafter last session.    Pertinent History  polycythemia vera, HTN, hemochromatosis, gout, CHF, cardiomyopathy, back pain, 2nd degree AV block, lumbar spinal lami/decompression, pacemaker, ankle surgery    Diagnostic tests  per patient-  x-ray and US done, showing partial tear of RTC    Patient Stated Goals  "I don't want it to hurt no more"    Currently in Pain?  No/denies    Pain Score  0-No pain   up to 3/10 at worst   Pain Location  Shoulder    Pain Orientation  Right;Anterior    Pain Descriptors / Indicators  Dull;Aching    Pain Type  Chronic pain    Pain Radiating Towards  occasionally into R anterior upper arm - " not much the last week or so "    Pain Onset  More than a month ago    Pain Frequency  Intermittent    Multiple Pain Sites  No                       [facility] Adult PT Treatment/Exercise - 03/17/19 0001      Shoulder Exercises: Supine   Protraction  Right;15 reps    Protraction Limitations  R only    Flexion  Right;15 reps;Strengthening;AROM;Weights    Shoulder Flexion Weight (lbs)  1      Shoulder Exercises: Prone   Retraction  Right;15 reps;AROM;Weights    Retraction Weight (lbs)  3    Retraction Limitations  cues required for scap. retraction  Extension  Right;10 reps;Strengthening;AROM;Weights    Extension Weight (lbs)  1    Extension Limitations  cues for scap. retraction       Shoulder Exercises: Sidelying   External Rotation  Strengthening;Right;10 reps    External Rotation Weight (lbs)  1    ABduction  Right;15 reps;Strengthening;Weights    ABduction Weight (lbs)  1      Shoulder Exercises: ROM/Strengthening   UBE (Upper Arm Bike)  Lvl 1.0, 3 min forwards/3 min backwards     Cybex Row  15 reps    Cybex Row Limitations  15# - low row      Manual Therapy   Manual Therapy  Soft tissue mobilization;Myofascial release    Manual therapy comments  supine and sidelying    Soft tissue mobilization  STM to R anterior deltoid,proximal biceps (ttp), infra,teres group (min tenderness), R lat (min tenderness)               PT Short Term Goals - 03/13/19 1204      PT SHORT TERM GOAL #1   Title  Patient to be independent with initial HEP.    Time  3    Period   Weeks    Status  Achieved    Target Date  03/12/19        PT Long Term Goals - 02/25/19 1210      PT LONG TERM GOAL #1   Title  Patient to be independent with advanced HEP.    Time  6    Period  Weeks    Status  On-going      PT LONG TERM GOAL #2   Title  Patient to demonstrate R shoulder AROM symmetrical to opposite UE and without pain limiting.    Time  6    Period  Weeks    Status  On-going      PT LONG TERM GOAL #3   Title  Patient to demonstrate R shoulder strength >=4+/5.    Time  6    Period  Weeks    Status  On-going      PT LONG TERM GOAL #4   Title  Patient to report tolerance of lifting his grandson without R shoulder pain limiting.    Time  6    Period  Weeks    Status  On-going      PT LONG TERM GOAL #5   Title  Patient to demonstrate overhead lifting with R arm with 5lbs and good scapulohumeral rhythm.    Time  6    Period  Weeks    Status  On-going            Plan - 03/17/19 0958    Clinical Impression Statement  "William Villanueva" reporting R shoulder felt fine after last session.  Does note R LBP and R-sided mid back/Lat soreness for two days after last session.  As pt. unsure of possible triggering factor for this soreness from last session, proceeded "gently" in today's session.  Focused session on gentle scapular/RTC strengthening activities to tolerance.  Pt. with short-lasting complaint of "burning" pain on R lateral shoulder following sidelying ER however this quickly subsided with rest.  Pt. denying soreness to end session today.    Comorbidities  polycythemia vera, HTN, hemochromatosis, gout, CHF, cardiomyopathy, back pain, 2nd degree AV block, lumbar spinal lami/decompression, pacemaker, ankle surgery    Rehab Potential  Good    PT Treatment/Interventions  ADLs/Self Care Home Management;Cryotherapy;Iontophoresis 4mg /ml Dexamethasone;Moist Heat;Therapeutic exercise;Therapeutic activities;Functional  mobility training;Neuromuscular  re-education;Patient/family education;Manual techniques;Taping;Energy conservation;Dry needling;Passive range of motion    PT Next Visit Plan  AAROM/AROM per pt. tolerance    Consulted and Agree with Plan of Care  Patient       Patient will benefit from skilled therapeutic intervention in order to improve the following deficits and impairments:  Increased edema, Decreased activity tolerance, Decreased strength, Impaired UE functional use, Pain, Increased fascial restricitons, Increased muscle spasms, Improper body mechanics, Decreased range of motion, Impaired flexibility, Postural dysfunction  Visit Diagnosis: Chronic right shoulder pain  Stiffness of right shoulder, not elsewhere classified  Muscle weakness (generalized)  Abnormal posture     Problem List Patient Active Problem List   Diagnosis Date Noted  . CHF (congestive heart failure) ([place]) 07/17/2017  . COPD (chronic obstructive pulmonary disease) ([place]) 06/14/2017  . Acute renal failure (ARF) ([place]) 05/27/2016  . Hyponatremia 05/27/2016  . Hypokalemia 05/27/2016  . AKI (acute kidney injury) ([place]) 05/26/2016  . Depression 04/05/2016  . Kidney stones 04/05/2016  . Osteoarthritis 04/05/2016  . Sleep apnea 04/05/2016  . Bilateral carpal tunnel syndrome 12/02/2015  . Chronic combined systolic and diastolic congestive heart failure ([place]) 04/13/2015  . Orthostatic hypotension 01/19/2015  . Obesity 11/26/2014  . Enlarged prostate without lower urinary tract symptoms (luts) 10/29/2014  . Essential hypertension 10/29/2014  . GERD (gastroesophageal reflux disease) 10/29/2014  . Panic attack 10/29/2014  . Changing skin lesion 10/16/2014  . Acute on chronic combined systolic and diastolic congestive heart failure ([place]) 10/14/2014  . Renal cyst 09/02/2014  . Pacemaker lead malfunction-elevated threshold RV lead 07/31/2014  . Dyspnea on exertion 06/21/2014  . Noninfective gastroenteritis and colitis 06/15/2014  . Pacemaker-CRT  06/11/2014  . Palpitations 06/11/2014  . 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  . Other cardiomyopathies ([place]) 04/29/2014  . Bradycardia 04/28/2014  . Chronic pain 02/12/2014  . 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 Muscular wasting and disuse atrophy 08/15/2013  . Lumbar and sacral osteoarthritis 05/19/2013  . Congestive dilated cardiomyopathy ([place]) 01/11/2012  . Compulsive tobacco user syndrome 12/13/2011  . Current tobacco use 12/13/2011  . Chest tightness   . Rash   . Back pain   . Hemochromatosis   . SOB (shortness of breath)   . Gout   . Polycythemia vera ([place]) 03/27/2011    Bess Harvest, PTA 03/17/19 12:49 PM    [facility] [address]  [place], [place], [address] Phone: [phone]   Fax:  [phone]  Name: William Villanueva MRN: [id number] Date of Birth: 24-Jun-1950

## 2019-03-19 ENCOUNTER — Other Ambulatory Visit: Payer: Self-pay

## 2019-03-19 ENCOUNTER — Ambulatory Visit: Payer: PPO | Admitting: Physical Therapy

## 2019-03-19 ENCOUNTER — Encounter: Payer: Self-pay | Admitting: Physical Therapy

## 2019-03-19 DIAGNOSIS — G8929 Other chronic pain: Secondary | ICD-10-CM

## 2019-03-19 DIAGNOSIS — R293 Abnormal posture: Secondary | ICD-10-CM

## 2019-03-19 DIAGNOSIS — M25511 Pain in right shoulder: Secondary | ICD-10-CM | POA: Diagnosis not present

## 2019-03-19 DIAGNOSIS — M25611 Stiffness of right shoulder, not elsewhere classified: Secondary | ICD-10-CM

## 2019-03-19 DIAGNOSIS — M6281 Muscle weakness (generalized): Secondary | ICD-10-CM

## 2019-03-19 LAB — CUP PACEART REMOTE DEVICE CHECK
Battery Remaining Longevity: 79 mo
Battery Remaining Percentage: 89 %
Battery Voltage: 2.95 V
Brady Statistic AP VP Percent: 13 %
Brady Statistic AP VS Percent: 1 %
Brady Statistic AS VP Percent: 85 %
Brady Statistic AS VS Percent: 1 %
Brady Statistic RA Percent Paced: 10 %
Date Time Interrogation Session: 20201111070014
Implantable Lead Implant Date: 20151223
Implantable Lead Implant Date: 20151223
Implantable Lead Implant Date: 20151223
Implantable Lead Location: 753858
Implantable Lead Location: 753859
Implantable Lead Location: 753860
Implantable Pulse Generator Implant Date: 20151223
Lead Channel Impedance Value: 440 Ohm
Lead Channel Impedance Value: 450 Ohm
Lead Channel Impedance Value: 590 Ohm
Lead Channel Pacing Threshold Amplitude: 1.125 V
Lead Channel Pacing Threshold Amplitude: 1.25 V
Lead Channel Pacing Threshold Amplitude: 1.25 V
Lead Channel Pacing Threshold Pulse Width: 0.4 ms
Lead Channel Pacing Threshold Pulse Width: 0.4 ms
Lead Channel Pacing Threshold Pulse Width: 0.7 ms
Lead Channel Sensing Intrinsic Amplitude: 3.7 mV
Lead Channel Sensing Intrinsic Amplitude: 8.3 mV
Lead Channel Setting Pacing Amplitude: 2.125
Lead Channel Setting Pacing Amplitude: 2.25 V
Lead Channel Setting Pacing Amplitude: 2.25 V
Lead Channel Setting Pacing Pulse Width: 0.4 ms
Lead Channel Setting Pacing Pulse Width: 0.7 ms
Lead Channel Setting Sensing Sensitivity: 2 mV
Pulse Gen Model: 3242
Pulse Gen Serial Number: 7701275

## 2019-03-19 NOTE — Therapy (Signed)
[facility] [address]  [place], [place], [address] Phone: [phone]   Fax:  [phone]  Physical Therapy Treatment  Patient Details  Name: William Villanueva MRN: [id number] Date of Birth: 02/10/1951 Referring Provider (PT): Rhina Brackett, MD   Encounter Date: 03/19/2019  PT End of Session - 03/19/19 1107    Visit Number  7    Number of Visits  13    Date for PT Re-Evaluation  04/02/19    Authorization Type  [employer]    PT Start Time  1017    PT Stop Time  1058    PT Time Calculation (min)  41 min    Activity Tolerance  Patient tolerated treatment well;Patient limited by pain    Behavior During Therapy  [facility] for tasks assessed/performed       Past Medical History:  Diagnosis Date  . AV block, 2nd degree 2015   St. Jude Allure Quadra pulse generator [id number], model PM 3242  . Back pain   . Cardiomyopathy ([place])    Nonischemic 45%.   . CHF (congestive heart failure) ([place])   . Gout   . Hemochromatosis   . Hypertension   . Hypospadias 14-Aug-1950   born with  . Nephrolithiasis   . Polycythemia vera(238.4)     Past Surgical History:  Procedure Laterality Date  . ANKLE SURGERY    . BI-VENTRICULAR PACEMAKER INSERTION N/A 04/29/2014   Procedure: BI-VENTRICULAR PACEMAKER INSERTION (CRT-P);  Surgeon: Deboraha Sprang, MD; Laterality: Left  St. Jude Allure Quadra pulse generator [phone], model PM [phone]  . PILONIDAL CYST EXCISION    . POSTERIOR LAMINECTOMY / DECOMPRESSION LUMBAR SPINE    . TONSILLECTOMY      There were no vitals filed for this visit.  Subjective Assessment - 03/19/19 1016    Subjective  Feels like he is doing better and the shoulder isnt hurting as bad. The weather isn't helping wiht pain today d/t the RA in his shoulder.    Pertinent History  polycythemia vera, HTN, hemochromatosis, gout, CHF, cardiomyopathy, back pain, 2nd degree AV block, lumbar spinal lami/decompression, pacemaker,  ankle surgery    Diagnostic tests  per patient- x-ray and US done, showing partial tear of RTC    Patient Stated Goals  "I don't want it to hurt no more"    Currently in Pain?  Yes    Pain Score  4     Pain Location  Shoulder    Pain Orientation  Right    Pain Descriptors / Indicators  Discomfort    Pain Type  Chronic pain                       [facility] Adult PT Treatment/Exercise - 03/19/19 0001      Shoulder Exercises: Supine   Protraction  Right;15 reps    Protraction Weight (lbs)  3    Protraction Limitations  2x15; R only    Horizontal ABduction  Strengthening;Both;10 reps;Theraband    Theraband Level (Shoulder Horizontal ABduction)  Level 1 (Yellow)    Horizontal ABduction Limitations  2x10; better tolerance than sitting version      Shoulder Exercises: Seated   External Rotation  Strengthening;Both;10 reps;Theraband    Theraband Level (Shoulder External Rotation)  Level 1 (Yellow)    External Rotation Limitations  B ER with yellow TB    c/o mild pain on top of R shoulder   Flexion  Strengthening;Right;10 reps;Weights  Flexion Weight (lbs)  1    Flexion Limitations  with slow speed for motor control   c/o mild pain over top of shoulder   Other Seated Exercises  R shoulder scaption with 1# x10   c/o mild pain      Shoulder Exercises: Sidelying   External Rotation  Strengthening;Right;10 reps    External Rotation Weight (lbs)  1    External Rotation Limitations  2x10; cues to stop at neutral   unable to tolerate 2# d/t pain   ABduction  Right;15 reps;Strengthening;Weights      Shoulder Exercises: ROM/Strengthening   UBE (Upper Arm Bike)  Lvl 1.0, 3 min forwards/3 min backwards       Manual Therapy   Manual Therapy  Soft tissue mobilization;Myofascial release    Manual therapy comments  supine    Soft tissue mobilization  STM to R lateral deltoid and deltoid insertion, proximal biceps tendon, UT, LS- most tenderness in LS and deltoid                PT Short Term Goals - 03/13/19 1204      PT SHORT TERM GOAL #1   Title  Patient to be independent with initial HEP.    Time  3    Period  Weeks    Status  Achieved    Target Date  03/12/19        PT Long Term Goals - 02/25/19 1210      PT LONG TERM GOAL #1   Title  Patient to be independent with advanced HEP.    Time  6    Period  Weeks    Status  On-going      PT LONG TERM GOAL #2   Title  Patient to demonstrate R shoulder AROM symmetrical to opposite UE and without pain limiting.    Time  6    Period  Weeks    Status  On-going      PT LONG TERM GOAL #3   Title  Patient to demonstrate R shoulder strength >=4+/5.    Time  6    Period  Weeks    Status  On-going      PT LONG TERM GOAL #4   Title  Patient to report tolerance of lifting his grandson without R shoulder pain limiting.    Time  6    Period  Weeks    Status  On-going      PT LONG TERM GOAL #5   Title  Patient to demonstrate overhead lifting with R arm with 5lbs and good scapulohumeral rhythm.    Time  6    Period  Weeks    Status  On-going            Plan - 03/19/19 1107    Clinical Impression Statement  Patient reporting mild increase in pain this AM d/t rain outside. Worked on progression of RTC and periscapular strengthening this session. Patient able to perform sitting flexion and scaption with light weight and good ROM and scapular mechanics, but with mild pain during last few reps. Able to perform B ER with light banded resistance with mild limitation in ROM on R side and again c/o mild pain. Patient however reporting good tolerance despite discomfort and with report of pain no worse after completing each exercise. Modified B horizontal abduction exercise by having patient perform this exercise in supine as it was better tolerated than sitting. Limited by pain while attempting sidelying ER with  2lbs but able to complete 1lbs. Ended session with STM to R shoulder with most  tenderness in LS and deltoid. Patient reported no pain or complaints at end of session. Patient progressing well per POC.    Comorbidities  polycythemia vera, HTN, hemochromatosis, gout, CHF, cardiomyopathy, back pain, 2nd degree AV block, lumbar spinal lami/decompression, pacemaker, ankle surgery    Rehab Potential  Good    PT Treatment/Interventions  ADLs/Self Care Home Management;Cryotherapy;Iontophoresis 61m/ml Dexamethasone;Moist Heat;Therapeutic exercise;Therapeutic activities;Functional mobility training;Neuromuscular re-education;Patient/family education;Manual techniques;Taping;Energy conservation;Dry needling;Passive range of motion    PT Next Visit Plan  progress RTC and periscapular strengthening to tolerance    Consulted and Agree with Plan of Care  Patient       Patient will benefit from skilled therapeutic intervention in order to improve the following deficits and impairments:  Increased edema, Decreased activity tolerance, Decreased strength, Impaired UE functional use, Pain, Increased fascial restricitons, Increased muscle spasms, Improper body mechanics, Decreased range of motion, Impaired flexibility, Postural dysfunction  Visit Diagnosis: Chronic right shoulder pain  Stiffness of right shoulder, not elsewhere classified  Muscle weakness (generalized)  Abnormal posture     Problem List Patient Active Problem List   Diagnosis Date Noted  . CHF (congestive heart failure) ([place] 07/17/2017  . COPD (chronic obstructive pulmonary disease) ([place] 06/14/2017  . Acute renal failure (ARF) ([place] 05/27/2016  . Hyponatremia 05/27/2016  . Hypokalemia 05/27/2016  . AKI (acute kidney injury) ([place] 05/26/2016  . Depression 04/05/2016  . Kidney stones 04/05/2016  . Osteoarthritis 04/05/2016  . Sleep apnea 04/05/2016  . Bilateral carpal tunnel syndrome 12/02/2015  . Chronic combined systolic and diastolic congestive heart failure ([place] 04/13/2015  . Orthostatic hypotension  01/19/2015  . Obesity 11/26/2014  . Enlarged prostate without lower urinary tract symptoms (luts) 10/29/2014  . Essential hypertension 10/29/2014  . GERD (gastroesophageal reflux disease) 10/29/2014  . Panic attack 10/29/2014  . Changing skin lesion 10/16/2014  . Acute on chronic combined systolic and diastolic congestive heart failure ([place] 10/14/2014  . Renal cyst 09/02/2014  . Pacemaker lead malfunction-elevated threshold RV lead 07/31/2014  . Dyspnea on exertion 06/21/2014  . Noninfective gastroenteritis and colitis 06/15/2014  . 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 Palpitations 06/11/2014  . 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  . Other cardiomyopathies ([place] 04/29/2014  . Bradycardia 04/28/2014  . Chronic pain 02/12/2014  . Thrombocythemia ([place] 12/01/2013  . Muscular wasting and disuse atrophy 08/15/2013  . Lumbar and sacral osteoarthritis 05/19/2013  . Congestive dilated cardiomyopathy ([place] 01/11/2012  . Compulsive tobacco user syndrome 12/13/2011  . Current tobacco use 12/13/2011  . Chest tightness   . Rash   . Back pain   . Hemochromatosis   . SOB (shortness of breath)   . Gout   . Polycythemia vera ([place] 03/27/2011      YJanene Harvey PT, DPT 03/19/19 11:10 AM   [facility] [place] [address]Phone: [phone]  Fax:  [phone] Name: CTevion LaforgeMRN: [id number] of Birth: 210-17-1952

## 2019-03-20 ENCOUNTER — Ambulatory Visit (INDEPENDENT_AMBULATORY_CARE_PROVIDER_SITE_OTHER): Payer: PPO | Admitting: *Deleted

## 2019-03-20 DIAGNOSIS — I441 Atrioventricular block, second degree: Secondary | ICD-10-CM

## 2019-03-20 DIAGNOSIS — I509 Heart failure, unspecified: Secondary | ICD-10-CM

## 2019-03-24 DIAGNOSIS — E7800 Pure hypercholesterolemia, unspecified: Secondary | ICD-10-CM | POA: Diagnosis not present

## 2019-03-24 DIAGNOSIS — K529 Noninfective gastroenteritis and colitis, unspecified: Secondary | ICD-10-CM | POA: Diagnosis not present

## 2019-03-24 DIAGNOSIS — F321 Major depressive disorder, single episode, moderate: Secondary | ICD-10-CM | POA: Diagnosis not present

## 2019-03-24 DIAGNOSIS — M109 Gout, unspecified: Secondary | ICD-10-CM | POA: Diagnosis not present

## 2019-03-24 DIAGNOSIS — N183 Chronic kidney disease, stage 3 unspecified: Secondary | ICD-10-CM | POA: Diagnosis not present

## 2019-03-24 DIAGNOSIS — K219 Gastro-esophageal reflux disease without esophagitis: Secondary | ICD-10-CM | POA: Diagnosis not present

## 2019-03-24 DIAGNOSIS — D45 Polycythemia vera: Secondary | ICD-10-CM | POA: Diagnosis not present

## 2019-03-24 DIAGNOSIS — I5043 Acute on chronic combined systolic (congestive) and diastolic (congestive) heart failure: Secondary | ICD-10-CM | POA: Diagnosis not present

## 2019-03-24 DIAGNOSIS — E1122 Type 2 diabetes mellitus with diabetic chronic kidney disease: Secondary | ICD-10-CM | POA: Diagnosis not present

## 2019-03-24 DIAGNOSIS — M069 Rheumatoid arthritis, unspecified: Secondary | ICD-10-CM | POA: Diagnosis not present

## 2019-03-24 DIAGNOSIS — M47812 Spondylosis without myelopathy or radiculopathy, cervical region: Secondary | ICD-10-CM | POA: Diagnosis not present

## 2019-03-25 ENCOUNTER — Ambulatory Visit: Payer: PPO

## 2019-03-25 ENCOUNTER — Other Ambulatory Visit: Payer: Self-pay

## 2019-03-25 DIAGNOSIS — M6281 Muscle weakness (generalized): Secondary | ICD-10-CM

## 2019-03-25 DIAGNOSIS — M25511 Pain in right shoulder: Secondary | ICD-10-CM | POA: Diagnosis not present

## 2019-03-25 DIAGNOSIS — G8929 Other chronic pain: Secondary | ICD-10-CM

## 2019-03-25 DIAGNOSIS — R293 Abnormal posture: Secondary | ICD-10-CM

## 2019-03-25 DIAGNOSIS — M25611 Stiffness of right shoulder, not elsewhere classified: Secondary | ICD-10-CM

## 2019-03-25 NOTE — Therapy (Signed)
[facility] [address]  [place], [place], [address] Phone: [phone]   Fax:  [phone]  Physical Therapy Treatment  Patient Details  Name: William Villanueva MRN: [id number] Date of Birth: 1950/12/05 Referring Provider (PT): Rhina Brackett, MD   Encounter Date: 03/25/2019  PT End of Session - 03/25/19 1024    Visit Number  8    Number of Visits  13    Date for PT Re-Evaluation  04/02/19    Authorization Type  [employer]    PT Start Time  1018    PT Stop Time  1100    PT Time Calculation (min)  42 min    Activity Tolerance  Patient tolerated treatment well;Patient limited by pain    Behavior During Therapy  [facility] for tasks assessed/performed       Past Medical History:  Diagnosis Date  . AV block, 2nd degree 2015   St. Jude Allure Quadra pulse generator [id number], model PM 3242  . Back pain   . Cardiomyopathy ([place])    Nonischemic 45%.   . CHF (congestive heart failure) ([place])   . Gout   . Hemochromatosis   . Hypertension   . Hypospadias 1950/11/30   born with  . Nephrolithiasis   . Polycythemia vera(238.4)     Past Surgical History:  Procedure Laterality Date  . ANKLE SURGERY    . BI-VENTRICULAR PACEMAKER INSERTION N/A 04/29/2014   Procedure: BI-VENTRICULAR PACEMAKER INSERTION (CRT-P);  Surgeon: Deboraha Sprang, MD; Laterality: Left  St. Jude Allure Quadra pulse generator [phone], model PM [phone]  . PILONIDAL CYST EXCISION    . POSTERIOR LAMINECTOMY / DECOMPRESSION LUMBAR SPINE    . TONSILLECTOMY      There were no vitals filed for this visit.  Subjective Assessment - 03/25/19 1023    Subjective  Pt. noting improved R shoulder pain.    Pertinent History  polycythemia vera, HTN, hemochromatosis, gout, CHF, cardiomyopathy, back pain, 2nd degree AV block, lumbar spinal lami/decompression, pacemaker, ankle surgery    Diagnostic tests  per patient- x-ray and US done, showing partial tear of RTC    Patient Stated Goals  "I don't want it to hurt no more"    Currently in Pain?  No/denies    Pain Score  0-No pain   up to a 4/10 pain at worst when sleeping on R side   Pain Location  Shoulder    Pain Orientation  Right    Pain Type  Chronic pain    Pain Onset  More than a month ago         [facility] PT Assessment - 03/25/19 0001      Assessment   Medical Diagnosis  R shoulder pain, incomplete RTC tear    Referring Provider (PT)  Rhina Brackett, MD    Onset Date/Surgical Date  07/20/18    Hand Dominance  Right    Next MD Visit  patient unsure    Prior Therapy  yes      Strength   Strength Assessment Site  Shoulder    Right/Left Shoulder  Right;Left    Right Shoulder Flexion  4+/5    Right Shoulder ABduction  4/5    Right Shoulder Internal Rotation  4+/5    Right Shoulder External Rotation  4-/5    Left Shoulder Flexion  4+/5    Left Shoulder ABduction  4+/5    Left Shoulder Internal Rotation  4+/5    Left Shoulder  External Rotation  4+/5                   [facility] Adult PT Treatment/Exercise - 03/25/19 0001      Shoulder Exercises: Standing   External Rotation  Right;15 reps;Theraband;Strengthening    Theraband Level (Shoulder External Rotation)  Level 1 (Yellow)    External Rotation Limitations  AROM    Internal Rotation  Right;15 reps;Theraband;Strengthening    Theraband Level (Shoulder Internal Rotation)  Level 1 (Yellow)    Internal Rotation Limitations  AROM    Flexion  10 reps;AROM    Flexion Limitations  seated     ABduction  Both;10 reps    ABduction Limitations  scaption; AROM      Shoulder Exercises: Pulleys   Flexion  3 minutes    Flexion Limitations  to tolerance    Scaption  3 minutes    Scaption Limitations  to tolerance      Shoulder Exercises: ROM/Strengthening   Wall Pushups  10 reps    Pushups  10 reps    Pushups Limitations  Serratus punch on wall              PT Education - 03/25/19 1100    Education Details  HEP update;  wall pushups,  B shoulder band ER seated with yellow TB issued to pt.    Person(s) Educated  Patient    Methods  Explanation;Demonstration;Verbal cues;Handout    Comprehension  Verbalized understanding;Returned demonstration;Verbal cues required       PT Short Term Goals - 03/13/19 1204      PT SHORT TERM GOAL #1   Title  Patient to be independent with initial HEP.    Time  3    Period  Weeks    Status  Achieved    Target Date  03/12/19        PT Long Term Goals - 03/25/19 1039      PT LONG TERM GOAL #1   Title  Patient to be independent with advanced HEP.    Time  6    Period  Weeks    Status  Partially Met      PT LONG TERM GOAL #2   Title  Patient to demonstrate R shoulder AROM symmetrical to opposite UE and without pain limiting.    Time  6    Period  Weeks    Status  On-going      PT LONG TERM GOAL #3   Title  Patient to demonstrate R shoulder strength >=4+/5.    Time  6    Period  Weeks    Status  Partially Met      PT LONG TERM GOAL #4   Title  Patient to report tolerance of lifting his grandson without R shoulder pain limiting.    Time  6    Period  Weeks    Status  Achieved   03/25/19: able to lift grandson over weekend without increased pain     PT LONG TERM GOAL #5   Title  Patient to demonstrate overhead lifting with R arm with 5lbs and good scapulohumeral rhythm.    Time  6    Period  Weeks    Status  On-going            Plan - 03/25/19 1238    Clinical Impression Statement  William Villanueva reporting recent improvement in R shoulder.  R shoulder pain has not been bothering him last few days. Tolerated progression  of gentle RTC strengthening and periscapular strengthening well today. Updated HEP (see pt. education section).  Ended visit pain free.    Comorbidities  polycythemia vera, HTN, hemochromatosis, gout, CHF, cardiomyopathy, back pain, 2nd degree AV block, lumbar spinal lami/decompression, pacemaker, ankle surgery    Rehab Potential  Good    PT  Treatment/Interventions  ADLs/Self Care Home Management;Cryotherapy;Iontophoresis 52m/ml Dexamethasone;Moist Heat;Therapeutic exercise;Therapeutic activities;Functional mobility training;Neuromuscular re-education;Patient/family education;Manual techniques;Taping;Energy conservation;Dry needling;Passive range of motion    PT Next Visit Plan  progress RTC and periscapular strengthening to tolerance    Consulted and Agree with Plan of Care  Patient       Patient will benefit from skilled therapeutic intervention in order to improve the following deficits and impairments:  Increased edema, Decreased activity tolerance, Decreased strength, Impaired UE functional use, Pain, Increased fascial restricitons, Increased muscle spasms, Improper body mechanics, Decreased range of motion, Impaired flexibility, Postural dysfunction  Visit Diagnosis: Chronic right shoulder pain  Stiffness of right shoulder, not elsewhere classified  Muscle weakness (generalized)  Abnormal posture     Problem List Patient Active Problem List   Diagnosis Date Noted  . CHF (congestive heart failure) ([place] 07/17/2017  . COPD (chronic obstructive pulmonary disease) ([place] 06/14/2017  . Acute renal failure (ARF) ([place] 05/27/2016  . Hyponatremia 05/27/2016  . Hypokalemia 05/27/2016  . AKI (acute kidney injury) ([place] 05/26/2016  . Depression 04/05/2016  . Kidney stones 04/05/2016  . Osteoarthritis 04/05/2016  . Sleep apnea 04/05/2016  . Bilateral carpal tunnel syndrome 12/02/2015  . Chronic combined systolic and diastolic congestive heart failure ([place] 04/13/2015  . Orthostatic hypotension 01/19/2015  . Obesity 11/26/2014  . Enlarged prostate without lower urinary tract symptoms (luts) 10/29/2014  . Essential hypertension 10/29/2014  . GERD (gastroesophageal reflux disease) 10/29/2014  . Panic attack 10/29/2014  . Changing skin lesion 10/16/2014  . Acute on chronic combined systolic and diastolic congestive heart  failure ([place] 10/14/2014  . Renal cyst 09/02/2014  . Pacemaker lead malfunction-elevated threshold RV lead 07/31/2014  . Dyspnea on exertion 06/21/2014  . Noninfective gastroenteritis and colitis 06/15/2014  . 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 Palpitations 06/11/2014  . 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  . Other cardiomyopathies ([place] 04/29/2014  . Bradycardia 04/28/2014  . Chronic pain 02/12/2014  . 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 Muscular wasting and disuse atrophy 08/15/2013  . Lumbar and sacral osteoarthritis 05/19/2013  . Congestive dilated cardiomyopathy ([place] 01/11/2012  . Compulsive tobacco user syndrome 12/13/2011  . Current tobacco use 12/13/2011  . Chest tightness   . Rash   . Back pain   . Hemochromatosis   . SOB (shortness of breath)   . Gout   . Polycythemia vera ([place] 03/27/2011    MBess Harvest PTA 03/25/19 12:41 PM    [facility] [address] [place] [address]Phone: [phone]  Fax:  [phone] Name: CJehad BisonoMRN: [id number] of Birth: 212/19/1952

## 2019-03-28 ENCOUNTER — Ambulatory Visit: Payer: PPO | Admitting: Physical Therapy

## 2019-03-28 DIAGNOSIS — Z20828 Contact with and (suspected) exposure to other viral communicable diseases: Secondary | ICD-10-CM | POA: Diagnosis not present

## 2019-03-31 ENCOUNTER — Other Ambulatory Visit: Payer: Self-pay

## 2019-03-31 ENCOUNTER — Ambulatory Visit: Payer: PPO | Admitting: Physical Therapy

## 2019-03-31 ENCOUNTER — Encounter: Payer: Self-pay | Admitting: Physical Therapy

## 2019-03-31 DIAGNOSIS — M25511 Pain in right shoulder: Secondary | ICD-10-CM | POA: Diagnosis not present

## 2019-03-31 DIAGNOSIS — M6281 Muscle weakness (generalized): Secondary | ICD-10-CM

## 2019-03-31 DIAGNOSIS — G8929 Other chronic pain: Secondary | ICD-10-CM

## 2019-03-31 DIAGNOSIS — R293 Abnormal posture: Secondary | ICD-10-CM

## 2019-03-31 DIAGNOSIS — M25611 Stiffness of right shoulder, not elsewhere classified: Secondary | ICD-10-CM

## 2019-03-31 NOTE — Therapy (Signed)
[facility] [address]  [place], [place], [address] Phone: [phone]   Fax:  [phone]  Physical Therapy Progress Note/Discharge Summary  Patient Details  Name: William Villanueva MRN: [id number] Date of Birth: Oct 02, 1950 Referring Provider (PT): Rhina Brackett, MD   Progress Note Reporting Period 02/19/19 to 03/31/19  See note below for Objective Data and Assessment of Progress/Goals.     Encounter Date: 03/31/2019  PT End of Session - 03/31/19 1610    Visit Number  9    Number of Visits  13    Date for PT Re-Evaluation  04/02/19    Authorization Type  [employer]    PT Start Time  1530    PT Stop Time  1608    PT Time Calculation (min)  38 min    Activity Tolerance  Patient tolerated treatment well    Behavior During Therapy  WFL for tasks assessed/performed       Past Medical History:  Diagnosis Date  . AV block, 2nd degree 2015   St. Jude Allure Quadra pulse generator [id number], model PM 3242  . Back pain   . Cardiomyopathy ([place])    Nonischemic 45%.   . CHF (congestive heart failure) ([place])   . Gout   . Hemochromatosis   . Hypertension   . Hypospadias 1951/03/13   born with  . Nephrolithiasis   . Polycythemia vera(238.4)     Past Surgical History:  Procedure Laterality Date  . ANKLE SURGERY    . BI-VENTRICULAR PACEMAKER INSERTION N/A 04/29/2014   Procedure: BI-VENTRICULAR PACEMAKER INSERTION (CRT-P);  Surgeon: Deboraha Sprang, MD; Laterality: Left  St. Jude Allure Quadra pulse generator [phone], model PM [phone]  . PILONIDAL CYST EXCISION    . POSTERIOR LAMINECTOMY / DECOMPRESSION LUMBAR SPINE    . TONSILLECTOMY      There were no vitals filed for this visit.  Subjective Assessment - 03/31/19 1531    Subjective  L ankle popped as he was leaving to head to his last PT session, thus he had to miss it. It is feeling better now. Notes 90% improvement in R shoulder since initial eval. Still  having some pain in shoulder occasionally in PM.    Pertinent History  polycythemia vera, HTN, hemochromatosis, gout, CHF, cardiomyopathy, back pain, 2nd degree AV block, lumbar spinal lami/decompression, pacemaker, ankle surgery    Diagnostic tests  per patient- x-ray and US done, showing partial tear of RTC    Patient Stated Goals  "I don't want it to hurt no more"    Multiple Pain Sites  Yes    Pain Score  4    Pain Location  Back    Pain Orientation  Right;Left;Lower    Pain Descriptors / Indicators  Aching;Dull    Pain Type  Chronic pain         [facility] PT Assessment - 03/31/19 0001      Observation/Other Assessments   Focus on Therapeutic Outcomes (FOTO)   Shoulder: 68 (32% limited, 34% predicted)      AROM   Right Shoulder Flexion  151 Degrees    Right Shoulder ABduction  166 Degrees    Right Shoulder Internal Rotation  --   FIR T9   Right Shoulder External Rotation  --   FER T3     Strength   Right Shoulder Flexion  4+/5    Right Shoulder ABduction  4+/5   mild pain   Right Shoulder Internal  Rotation  4+/5   mild pain   Right Shoulder External Rotation  4/5   mild pain                  [facility] Adult PT Treatment/Exercise - 03/31/19 0001      Shoulder Exercises: Supine   Horizontal ABduction  Strengthening;Both;10 reps;Theraband    Theraband Level (Shoulder Horizontal ABduction)  Level 1 (Yellow);Level 2 (Red)    Horizontal ABduction Limitations  10x each TB; cues to decrease speed      Shoulder Exercises: Seated   Row  Strengthening;Both;10 reps;Theraband    Theraband Level (Shoulder Row)  Level 3 (Green)    Row Limitations  good form    Flexion  Strengthening;Right;10 reps;Weights    Flexion Weight (lbs)  2,3    Flexion Limitations  5x 2#, 5x 3# to 90 deg    Other Seated Exercises  R shoulder scaption 5x 2#, 5x 3# to 90 deg      Shoulder Exercises: Standing   Flexion  Strengthening;Right;5 reps    Flexion Limitations  reaching overhead to 2nd  shelf with 1-5#    with good scapulohumeral mechanics     Shoulder Exercises: ROM/Strengthening   UBE (Upper Arm Bike)  Lvl 1.5, 3 min forwards/3 min backwards              PT Education - 03/31/19 1609    Education Details  update/consolidation of HEP; edu on objective progress and remaining impairments    Person(s) Educated  Patient    Methods  Explanation;Demonstration;Tactile cues;Verbal cues;Handout    Comprehension  Verbalized understanding;Returned demonstration       PT Short Term Goals - 03/31/19 1535      PT SHORT TERM GOAL #1   Title  Patient to be independent with initial HEP.    Time  3    Period  Weeks    Status  Achieved    Target Date  03/12/19        PT Long Term Goals - 03/31/19 1535      PT LONG TERM GOAL #1   Title  Patient to be independent with advanced HEP.    Time  6    Period  Weeks    Status  Achieved      PT LONG TERM GOAL #2   Title  Patient to demonstrate R shoulder AROM symmetrical to opposite UE and without pain limiting.    Time  6    Period  Weeks    Status  Achieved      PT LONG TERM GOAL #3   Title  Patient to demonstrate R shoulder strength >=4+/5.    Time  6    Period  Weeks    Status  Partially Met   R shoulder ER 4/5     PT LONG TERM GOAL #4   Title  Patient to report tolerance of lifting his grandson without R shoulder pain limiting.    Time  6    Period  Weeks    Status  Achieved   03/25/19: able to lift grandson over weekend without increased pain     PT LONG TERM GOAL #5   Title  Patient to demonstrate overhead lifting with R arm with 5lbs and good scapulohumeral rhythm.    Time  6    Period  Weeks    Status  Achieved            Plan - 03/31/19 1621  Clinical Impression Statement  Patient reporting 90% improvement in R shoulder since initial eval. Notes that he still intermittently has pain at night. Patient has met or partially met all goals at this time. R shoulder AROM in nonpainful and  symmetrical to L UE. Strength has improved in ER and abduction, with mild weakness remaining in ER. Patient notes that he is now able to lift his grandson without pain, and was able to demonstrate R UE overhead reaching with 5lbs without complaints today. Worked on progressive RTC and periscapular strengthening ther-ex to address remaining impairments. Patient demonstrated good form and responsive to corrective cues. Reported understanding of HEP update and without complaints at end of session. Patient has demonstrated excellent progress and is ready for DC.    Comorbidities  polycythemia vera, HTN, hemochromatosis, gout, CHF, cardiomyopathy, back pain, 2nd degree AV block, lumbar spinal lami/decompression, pacemaker, ankle surgery    Rehab Potential  Good    PT Treatment/Interventions  ADLs/Self Care Home Management;Cryotherapy;Iontophoresis '4mg'$ /ml Dexamethasone;Moist Heat;Therapeutic exercise;Therapeutic activities;Functional mobility training;Neuromuscular re-education;Patient/family education;Manual techniques;Taping;Energy conservation;Dry needling;Passive range of motion    PT Next Visit Plan  DC at this time    Consulted and Agree with Plan of Care  Patient       Patient will benefit from skilled therapeutic intervention in order to improve the following deficits and impairments:  Increased edema, Decreased activity tolerance, Decreased strength, Impaired UE functional use, Pain, Increased fascial restricitons, Increased muscle spasms, Improper body mechanics, Decreased range of motion, Impaired flexibility, Postural dysfunction  Visit Diagnosis: Chronic right shoulder pain  Stiffness of right shoulder, not elsewhere classified  Muscle weakness (generalized)  Abnormal posture     Problem List Patient Active Problem List   Diagnosis Date Noted  . CHF (congestive heart failure) ([place]) 07/17/2017  . COPD (chronic obstructive pulmonary disease) ([place]) 06/14/2017  . Acute renal failure  (ARF) ([place]) 05/27/2016  . Hyponatremia 05/27/2016  . Hypokalemia 05/27/2016  . AKI (acute kidney injury) ([place]) 05/26/2016  . Depression 04/05/2016  . Kidney stones 04/05/2016  . Osteoarthritis 04/05/2016  . Sleep apnea 04/05/2016  . Bilateral carpal tunnel syndrome 12/02/2015  . Chronic combined systolic and diastolic congestive heart failure ([place]) 04/13/2015  . Orthostatic hypotension 01/19/2015  . Obesity 11/26/2014  . Enlarged prostate without lower urinary tract symptoms (luts) 10/29/2014  . Essential hypertension 10/29/2014  . GERD (gastroesophageal reflux disease) 10/29/2014  . Panic attack 10/29/2014  . Changing skin lesion 10/16/2014  . Acute on chronic combined systolic and diastolic congestive heart failure ([place]) 10/14/2014  . Renal cyst 09/02/2014  . Pacemaker lead malfunction-elevated threshold RV lead 07/31/2014  . Dyspnea on exertion 06/21/2014  . Noninfective gastroenteritis and colitis 06/15/2014  . 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 Palpitations 06/11/2014  . 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  . Other cardiomyopathies ([place]) 04/29/2014  . Bradycardia 04/28/2014  . Chronic pain 02/12/2014  . 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 Muscular wasting and disuse atrophy 08/15/2013  . Lumbar and sacral osteoarthritis 05/19/2013  . Congestive dilated cardiomyopathy ([place]) 01/11/2012  . Compulsive tobacco user syndrome 12/13/2011  . Current tobacco use 12/13/2011  . Chest tightness   . Rash   . Back pain   . Hemochromatosis   . SOB (shortness of breath)   . Gout   . Polycythemia vera ([place]) 03/27/2011     PHYSICAL THERAPY DISCHARGE SUMMARY  Visits from Start of Care: 9  Current functional level related to goals / functional outcomes: See above clinical impression   Remaining deficits: Decreased  ER strength   Education / Equipment: HEP  Plan: Patient agrees to discharge.  Patient goals were partially met. Patient is being discharged due to meeting the  stated rehab goals.  ?????     Janene Harvey, PT, DPT 03/31/19 4:24 PM   [facility] [address]  [place], [place], [address] Phone: [phone]   Fax:  [phone]  Name: William Villanueva MRN: [id number] Date of Birth: Feb 07, 1951

## 2019-04-10 NOTE — Progress Notes (Signed)
Remote pacemaker transmission.   

## 2019-04-14 DIAGNOSIS — M109 Gout, unspecified: Secondary | ICD-10-CM | POA: Diagnosis not present

## 2019-04-14 DIAGNOSIS — M19072 Primary osteoarthritis, left ankle and foot: Secondary | ICD-10-CM | POA: Diagnosis not present

## 2019-04-14 DIAGNOSIS — M67372 Transient synovitis, left ankle and foot: Secondary | ICD-10-CM | POA: Diagnosis not present

## 2019-04-17 DIAGNOSIS — H532 Diplopia: Secondary | ICD-10-CM | POA: Diagnosis not present

## 2019-05-08 ENCOUNTER — Other Ambulatory Visit: Payer: Self-pay | Admitting: Cardiology

## 2019-05-08 DIAGNOSIS — I429 Cardiomyopathy, unspecified: Secondary | ICD-10-CM

## 2019-06-02 ENCOUNTER — Inpatient Hospital Stay: Payer: Medicare Other

## 2019-06-02 ENCOUNTER — Other Ambulatory Visit: Payer: Self-pay

## 2019-06-02 ENCOUNTER — Telehealth: Payer: Self-pay | Admitting: Hematology & Oncology

## 2019-06-02 ENCOUNTER — Encounter: Payer: Self-pay | Admitting: Hematology & Oncology

## 2019-06-02 ENCOUNTER — Inpatient Hospital Stay: Payer: Medicare Other | Attending: Hematology & Oncology | Admitting: Hematology & Oncology

## 2019-06-02 VITALS — BP 134/63 | HR 79 | Temp 96.9°F | Resp 18 | Wt 292.8 lb

## 2019-06-02 DIAGNOSIS — I441 Atrioventricular block, second degree: Secondary | ICD-10-CM | POA: Diagnosis not present

## 2019-06-02 DIAGNOSIS — D45 Polycythemia vera: Secondary | ICD-10-CM | POA: Diagnosis present

## 2019-06-02 DIAGNOSIS — Z7982 Long term (current) use of aspirin: Secondary | ICD-10-CM | POA: Insufficient documentation

## 2019-06-02 LAB — CBC WITH DIFFERENTIAL (CANCER CENTER ONLY)
Abs Immature Granulocytes: 0.1 10*3/uL — ABNORMAL HIGH (ref 0.00–0.07)
Basophils Absolute: 0.2 10*3/uL — ABNORMAL HIGH (ref 0.0–0.1)
Basophils Relative: 2 %
Eosinophils Absolute: 0.2 10*3/uL (ref 0.0–0.5)
Eosinophils Relative: 3 %
HCT: 42.9 % (ref 39.0–52.0)
Hemoglobin: 13.7 g/dL (ref 13.0–17.0)
Immature Granulocytes: 1 %
Lymphocytes Relative: 16 %
Lymphs Abs: 1.3 10*3/uL (ref 0.7–4.0)
MCH: 33.1 pg (ref 26.0–34.0)
MCHC: 31.9 g/dL (ref 30.0–36.0)
MCV: 103.6 fL — ABNORMAL HIGH (ref 80.0–100.0)
Monocytes Absolute: 0.8 10*3/uL (ref 0.1–1.0)
Monocytes Relative: 10 %
Neutro Abs: 5.8 10*3/uL (ref 1.7–7.7)
Neutrophils Relative %: 68 %
Platelet Count: 427 10*3/uL — ABNORMAL HIGH (ref 150–400)
RBC: 4.14 MIL/uL — ABNORMAL LOW (ref 4.22–5.81)
RDW: 17.2 % — ABNORMAL HIGH (ref 11.5–15.5)
WBC Count: 8.4 10*3/uL (ref 4.0–10.5)
nRBC: 0.2 % (ref 0.0–0.2)

## 2019-06-02 LAB — CMP (CANCER CENTER ONLY)
ALT: 11 U/L (ref 0–44)
AST: 13 U/L — ABNORMAL LOW (ref 15–41)
Albumin: 4 g/dL (ref 3.5–5.0)
Alkaline Phosphatase: 68 U/L (ref 38–126)
Anion gap: 9 (ref 5–15)
BUN: 21 mg/dL (ref 8–23)
CO2: 26 mmol/L (ref 22–32)
Calcium: 8.7 mg/dL — ABNORMAL LOW (ref 8.9–10.3)
Chloride: 107 mmol/L (ref 98–111)
Creatinine: 1.18 mg/dL (ref 0.61–1.24)
GFR, Est AFR Am: >60 mL/min (ref >60)
GFR, Estimated: >60 mL/min (ref >60)
Glucose, Bld: 171 mg/dL — ABNORMAL HIGH (ref 70–99)
Potassium: 4.2 mmol/L (ref 3.5–5.1)
Sodium: 142 mmol/L (ref 135–145)
Total Bilirubin: 0.5 mg/dL (ref 0.3–1.2)
Total Protein: 6.8 g/dL (ref 6.5–8.1)

## 2019-06-02 LAB — FERRITIN: Ferritin: 9 ng/mL — ABNORMAL LOW (ref 24–336)

## 2019-06-02 LAB — IRON AND TIBC
Iron: 39 ug/dL — ABNORMAL LOW (ref 42–163)
Saturation Ratios: 10 % — ABNORMAL LOW (ref 20–55)
TIBC: 384 ug/dL (ref 202–409)
UIBC: 345 ug/dL (ref 117–376)

## 2019-06-02 LAB — LACTATE DEHYDROGENASE: LDH: 250 U/L — ABNORMAL HIGH (ref 98–192)

## 2019-06-02 LAB — SAVE SMEAR(SSMR), FOR PROVIDER SLIDE REVIEW

## 2019-06-02 NOTE — Progress Notes (Signed)
Hematology and Oncology Follow Up Visit  William Villanueva [id number] 09/20/1950 69 y.o. 06/02/2019   Principle Diagnosis:   Polycythemia vera-JAK2 positive  Heart block-Mobitz II  Current Therapy:    Hydrea 1000 mg p.o.daily -dose changed on 06/25/2018  Aspirin 81 mg by mouth daily  Phlebotomy to maintain hematocrit below 45%     Interim History:  [name] is back for followup.  He looks good.  He had no problems over the holidays.  He feels all right.  He has had no cardiac issues.  He has a pacemaker in place.  There is been no problems with headache.  He has a chronic back problems but this seems to be under fairly good control.  He has had no swelling.  He has had no rashes.  There is been no change in bowel or bladder habits.    Overall, his performance status is ECOG 1.    Medications:  Current Outpatient Medications:  .  aspirin 81 MG tablet, Take 81 mg by mouth daily., Disp: , Rfl:  .  BD PEN NEEDLE NANO U/F 32G X 4 MM MISC, , Disp: , Rfl:  .  carvedilol (COREG) 12.5 MG tablet, Take 1 tablet (12.5 mg total) by mouth 2 (two) times daily with a meal., Disp: 180 tablet, Rfl: 3 .  citalopram (CELEXA) 10 MG tablet, Take 5 mg by mouth daily. , Disp: , Rfl:  .  ENTRESTO 24-26 MG, TAKE ONE TABLET BY MOUTH TWICE A DAY, Disp: 60 tablet, Rfl: 6 .  febuxostat (ULORIC) 40 MG tablet, Take 80 mg by mouth daily., Disp: , Rfl:  .  Febuxostat 80 MG TABS, Take 80 mg by mouth daily., Disp: , Rfl:  .  folic acid (FOLVITE) 1 MG tablet, Take 1 mg by mouth daily., Disp: , Rfl:  .  furosemide (LASIX) 20 MG tablet, TAKE ONE TABLET BY MOUTH DAILY, Disp: 60 tablet, Rfl: 8 .  hydroxyurea (HYDREA) 500 MG capsule, Take 2 capsules (1,000 mg total) by mouth daily., Disp: 90 capsule, Rfl: 6 .  insulin degludec (TRESIBA FLEXTOUCH) 100 UNIT/ML SOPN FlexTouch Pen, Inject 20 Units into the skin daily., Disp: , Rfl:  .  mesalamine (APRISO) 0.375 g 24 hr capsule, Take 375 mg by mouth daily. , Disp: , Rfl:   .  Methotrexate Sodium (METHOTREXATE, PF,) 250 MG/10ML injection, Inject 6 mg into the vein once a week., Disp: , Rfl:  .  omeprazole (PRILOSEC) 20 MG capsule, Take 20 mg by mouth 2 (two) times daily before a meal. , Disp: , Rfl:  .  oxybutynin (DITROPAN-XL) 10 MG 24 hr tablet, Take 10 mg by mouth daily., Disp: , Rfl:  .  Probiotic Product (ALIGN PO), Take 1 capsule by mouth daily. , Disp: , Rfl:  .  rosuvastatin (CRESTOR) 5 MG tablet, Take 5 mg by mouth daily., Disp: , Rfl:  .  Spacer/Aero-Holding Chambers (AEROCHAMBER MV) inhaler, Use as instructed, Disp: 1 each, Rfl: 0 .  spironolactone (ALDACTONE) 25 MG tablet, Take 0.5 tablets (12.5 mg total) by mouth daily., Disp: 45 tablet, Rfl: 3 .  sulfaSALAzine (AZULFIDINE) 500 MG tablet, Take 500 mg by mouth 2 (two) times daily., Disp: , Rfl:  .  tamsulosin (FLOMAX) 0.4 MG CAPS capsule, Take 0.4 mg by mouth., Disp: , Rfl:  .  HYDROcodone-acetaminophen (NORCO/VICODIN) 5-325 MG tablet, Take 1 tablet by mouth every 4 (four) hours as needed. (Patient not taking: Reported on 03/04/2019), Disp: 10 tablet, Rfl: 0 No current facility-administered medications  for this visit.  Facility-Administered Medications Ordered in Other Visits:  .  0.9 %  sodium chloride infusion, , Intravenous, Continuous, [place], Rudell Cobb, MD, Stopped at 05/16/13 1115  Allergies:  Allergies  Allergen Reactions  . Bupropion Hives  . Fluoxetine Itching  . Ibuprofen Itching  . Prednisone Itching and Other (See Comments)    Abdominal pain Other reaction(s): Abdominal Pain   . Temazepam Other (See Comments)    Dizziness   . Trazodone And Nefazodone Other (See Comments)    Dizziness  . Cephalexin Diarrhea    Caused C-diff Caused C-diff   . Fluoxetine Hcl Itching  . Prozac [Fluoxetine Hcl] Itching    Past Medical History, Surgical history, Social history, and Family History were reviewed and updated.  Review of Systems: Review of Systems  Constitutional: Negative.    HENT: Negative.   Eyes: Negative.   Respiratory: Positive for shortness of breath.   Cardiovascular: Positive for chest pain.  Gastrointestinal: Negative.   Genitourinary: Negative.   Musculoskeletal: Positive for back pain.  Skin: Negative.   Neurological: Negative.   Endo/Heme/Allergies: Negative.   Psychiatric/Behavioral: Negative.     Physical Exam:  weight is 292 lb 12 oz (132.8 kg). His temporal temperature is 96.9 F (36.1 C) (abnormal). His blood pressure is 134/63 and his pulse is 79. His respiration is 18 and oxygen saturation is 96%.   Physical Exam Vitals reviewed.  HENT:     Head: Normocephalic and atraumatic.  Eyes:     Pupils: Pupils are equal, round, and reactive to light.  Cardiovascular:     Rate and Rhythm: Normal rate and regular rhythm.     Heart sounds: Normal heart sounds.  Pulmonary:     Effort: Pulmonary effort is normal.     Breath sounds: Normal breath sounds.  Abdominal:     General: Bowel sounds are normal.     Palpations: Abdomen is soft.  Musculoskeletal:        General: No tenderness or deformity. Normal range of motion.     Cervical back: Normal range of motion.  Lymphadenopathy:     Cervical: No cervical adenopathy.  Skin:    General: Skin is warm and dry.     Findings: No erythema or rash.  Neurological:     Mental Status: He is alert and oriented to person, place, and time.  Psychiatric:        Behavior: Behavior normal.        Thought Content: Thought content normal.        Judgment: Judgment normal.      Lab Results  Component Value Date   WBC 8.4 06/02/2019   HGB 13.7 06/02/2019   HCT 42.9 06/02/2019   MCV 103.6 (H) 06/02/2019   PLT 427 (H) 06/02/2019     Chemistry      Component Value Date/Time   NA 143 03/03/2019 0809   NA 141 04/10/2017 0923   NA 140 04/05/2016 0739   K 3.5 03/03/2019 0809   K 3.5 04/10/2017 0923   K 4.2 04/05/2016 0739   CL 106 03/03/2019 0809   CL 108 04/10/2017 0923   CO2 26 03/03/2019  0809   CO2 24 04/10/2017 0923   CO2 20 (L) 04/05/2016 0739   BUN 13 03/03/2019 0809   BUN 12 04/10/2017 0923   BUN 14.7 04/05/2016 0739   CREATININE 1.02 03/03/2019 0809   CREATININE 1.1 04/10/2017 0923   CREATININE 1.1 04/05/2016 0739      Component  Value Date/Time   CALCIUM 8.7 (L) 03/03/2019 0809   CALCIUM 8.4 04/10/2017 0923   CALCIUM 9.0 04/05/2016 0739   ALKPHOS 55 03/03/2019 0809   ALKPHOS 77 04/10/2017 0923   ALKPHOS 91 04/05/2016 0739   AST 15 03/03/2019 0809   AST 34 04/05/2016 0739   ALT 13 03/03/2019 0809   ALT 32 04/10/2017 0923   ALT 40 04/05/2016 0739   BILITOT 0.5 03/03/2019 0809   BILITOT 0.48 04/05/2016 0739      Impression and Plan: William Villanueva is 69 year old gentleman with polycythemia vera.  He does not need to be phlebotomized today.   I think that we can probably go 4 months now.  I think this would be very reasonable.  I noted that his platelet count was up a little bit more.  We will just have to watch this closely.  Volanda Napoleon, MD 1/25/20218:35 AM

## 2019-06-02 NOTE — Telephone Encounter (Signed)
Appointments scheduled calendar printed per 1/25 los

## 2019-06-19 ENCOUNTER — Ambulatory Visit (INDEPENDENT_AMBULATORY_CARE_PROVIDER_SITE_OTHER): Payer: Medicare Other | Admitting: *Deleted

## 2019-06-19 DIAGNOSIS — I441 Atrioventricular block, second degree: Secondary | ICD-10-CM

## 2019-06-19 LAB — CUP PACEART REMOTE DEVICE CHECK
Battery Remaining Longevity: 69 mo
Battery Remaining Percentage: 80 %
Battery Voltage: 2.93 V
Brady Statistic AP VP Percent: 13 %
Brady Statistic AP VS Percent: 1 %
Brady Statistic AS VP Percent: 85 %
Brady Statistic AS VS Percent: 1 %
Brady Statistic RA Percent Paced: 10 %
Date Time Interrogation Session: 20210210020032
Implantable Lead Implant Date: 20151223
Implantable Lead Implant Date: 20151223
Implantable Lead Implant Date: 20151223
Implantable Lead Location: 753858
Implantable Lead Location: 753859
Implantable Lead Location: 753860
Implantable Pulse Generator Implant Date: 20151223
Lead Channel Impedance Value: 430 Ohm
Lead Channel Impedance Value: 450 Ohm
Lead Channel Impedance Value: 640 Ohm
Lead Channel Pacing Threshold Amplitude: 1 V
Lead Channel Pacing Threshold Amplitude: 1.125 V
Lead Channel Pacing Threshold Amplitude: 1.375 V
Lead Channel Pacing Threshold Pulse Width: 0.4 ms
Lead Channel Pacing Threshold Pulse Width: 0.4 ms
Lead Channel Pacing Threshold Pulse Width: 0.7 ms
Lead Channel Sensing Intrinsic Amplitude: 12 mV
Lead Channel Sensing Intrinsic Amplitude: 3.7 mV
Lead Channel Setting Pacing Amplitude: 2 V
Lead Channel Setting Pacing Amplitude: 2.125
Lead Channel Setting Pacing Amplitude: 2.375
Lead Channel Setting Pacing Pulse Width: 0.4 ms
Lead Channel Setting Pacing Pulse Width: 0.7 ms
Lead Channel Setting Sensing Sensitivity: 2 mV
Pulse Gen Model: 3242
Pulse Gen Serial Number: 7701275

## 2019-06-19 NOTE — Progress Notes (Signed)
PPM Remote  

## 2019-06-21 ENCOUNTER — Ambulatory Visit: Payer: Medicare Other | Attending: Internal Medicine

## 2019-06-21 DIAGNOSIS — Z23 Encounter for immunization: Secondary | ICD-10-CM | POA: Insufficient documentation

## 2019-06-21 NOTE — Progress Notes (Signed)
   Covid-19 Vaccination Clinic  Name:  William Villanueva    MRN: [id number] DOB: [birth year]  06/21/2019  William Villanueva was observed post Covid-19 immunization for 15 minutes without incidence. He was provided with Vaccine Information Sheet and instruction to access the V-Safe system.   William Villanueva was instructed to call 911 with any severe reactions post vaccine: [name] Difficulty breathing  . Swelling of your face and throat  . A fast heartbeat  . A bad rash all over your body  . Dizziness and weakness    Immunizations Administered    Name Date Dose VIS Date Route   Pfizer COVID-19 Vaccine 06/21/2019  9:09 AM 0.3 mL 04/18/2019 Intramuscular   Manufacturer: [place]   Lot: [id number]   [place]: [id number]

## 2019-06-27 ENCOUNTER — Other Ambulatory Visit: Payer: Self-pay | Admitting: Family Medicine

## 2019-06-27 ENCOUNTER — Ambulatory Visit
Admission: RE | Admit: 2019-06-27 | Discharge: 2019-06-27 | Disposition: A | Discharge status: Home / Self Care | Payer: Medicare Other | Source: Ambulatory Visit | Attending: Family Medicine | Admitting: Family Medicine

## 2019-06-27 DIAGNOSIS — R059 Cough, unspecified: Secondary | ICD-10-CM

## 2019-06-27 DIAGNOSIS — R05 Cough: Secondary | ICD-10-CM

## 2019-07-08 ENCOUNTER — Other Ambulatory Visit: Payer: Self-pay | Admitting: Cardiology

## 2019-07-08 NOTE — Progress Notes (Signed)
HPI: FU cardiomyopathy/CHF. Echocardiogram in August of 2013 showed an ejection fraction of 35-40%. Cardiac catheterization in September of 2013 showed mild nonobstructive coronary disease. There was no hemodynamic evidence of restriction. Pulmonary capillary wedge pressure 10. Cardiac MRI in September of 2013 showed an ejection fraction of 34% with diffuse hypokinesis. There was no hyperenhancement or scar tissue and no evidence of cardiac hemochromatosis. TSH in September 2013 normal. 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 Patient subsequently had CRTP placed. Nuclear study March 2017 showed ejection fraction 32. There was prior inferior infarct but no ischemia. Treated medically as felt likely attenuation artifact. Echocardiogram 1/20 showed ejection fraction 45-50%.Since last seen,he has dyspnea with moderate activities but not routine activities.  No orthopnea, PND, pedal edema, chest pain or syncope.  Current Outpatient Medications  Medication Sig Dispense Refill  . aspirin 81 MG tablet Take 81 mg by mouth daily.    . carvedilol (COREG) 12.5 MG tablet Take 1 tablet (12.5 mg total) by mouth 2 (two) times daily with a meal. 180 tablet 3  . citalopram (CELEXA) 10 MG tablet Take 5 mg by mouth daily.     [name] ENTRESTO 24-26 MG TAKE ONE TABLET BY MOUTH TWICE A DAY 60 tablet 4  . febuxostat (ULORIC) 40 MG tablet Take 80 mg by mouth daily.    . folic acid (FOLVITE) 1 MG tablet Take 1 mg by mouth daily.    . furosemide (LASIX) 20 MG tablet TAKE ONE TABLET BY MOUTH DAILY 60 tablet 8  . hydroxyurea (HYDREA) 500 MG capsule Take 2 capsules (1,000 mg total) by mouth daily. 90 capsule 6  . insulin degludec (TRESIBA FLEXTOUCH) 100 UNIT/ML SOPN FlexTouch Pen Inject 20 Units into the skin daily.    . Methotrexate Sodium (METHOTREXATE, PF,) 250 MG/10ML injection Inject 6 mg into the vein once a week.    [name] omeprazole (PRILOSEC) 20 MG capsule Take 20 mg by mouth 2 (two) times daily  before a meal.     . rosuvastatin (CRESTOR) 5 MG tablet Take 5 mg by mouth daily.    [name] Spacer/Aero-Holding Chambers (AEROCHAMBER MV) inhaler Use as instructed 1 each 0  . spironolactone (ALDACTONE) 25 MG tablet Take 0.5 tablets (12.5 mg total) by mouth daily. 45 tablet 3  . tamsulosin (FLOMAX) 0.4 MG CAPS capsule Take 0.4 mg by mouth.    . BD PEN NEEDLE NANO U/F 32G X 4 MM MISC      No current facility-administered medications for this visit.   Facility-Administered Medications Ordered in Other Visits  Medication Dose Route Frequency Provider Last Rate Last Admin  . 0.9 %  sodium chloride infusion   Intravenous Continuous Volanda Napoleon, MD   Stopped at 05/16/13 1115     Past Medical History:  Diagnosis Date  . AV block, 2nd degree 2015   St. Jude Allure Quadra pulse generator [id number], model PM 3242  . Back pain   . Cardiomyopathy ([place])    Nonischemic 45%.   . CHF (congestive heart failure) ([place])   . Gout   . Hemochromatosis   . Hypertension   . Hypospadias 1950-07-19   born with  . Nephrolithiasis   . Polycythemia vera(238.4)     Past Surgical History:  Procedure Laterality Date  . ANKLE SURGERY    . BI-VENTRICULAR PACEMAKER INSERTION N/A 04/29/2014   Procedure: BI-VENTRICULAR PACEMAKER INSERTION (CRT-P);  Surgeon: Deboraha Sprang, MD; Laterality: Left  St. Jude Allure Quadra pulse generator  [id number], model PM 3242  . PILONIDAL CYST EXCISION    . POSTERIOR LAMINECTOMY / DECOMPRESSION LUMBAR SPINE    . TONSILLECTOMY      Social History   Socioeconomic History  . Marital status: Married    Spouse name: Not on file  . Number of children: Not on file  . Years of education: Not on file  . Highest education level: Not on file  Occupational History  . Not on file  Tobacco Use  . Smoking status: Former Smoker    Packs/day: 1.00    Years: 44.00    Pack years: 44.00    Types: Cigarettes    Start date: 06/05/1968    Quit date: 04/05/2013    Years since quitting: 6.2   . Smokeless tobacco: Never Used  . Tobacco comment: quit 2 years ago  Substance and Sexual Activity  . Alcohol use: Yes    Alcohol/week: 0.0 standard drinks    Comment: rare  . Drug use: No  . Sexual activity: Not Currently  Other Topics Concern  . Not on file  Social History Narrative   Lives at home with wife in a one story home.  Has no children.  Does not work.  Getting workman's comp.  Education: 4 years trade school.    Social Determinants of Health   Financial Resource Strain:   . Difficulty of Paying Living Expenses: Not on file  Food Insecurity:   . Worried About Charity fundraiser in the Last Year: Not on file  . Ran Out of Food in the Last Year: Not on file  Transportation Needs:   . Lack of Transportation (Medical): Not on file  . Lack of Transportation (Non-Medical): Not on file  Physical Activity:   . Days of Exercise per Week: Not on file  . Minutes of Exercise per Session: Not on file  Stress:   . Feeling of Stress : Not on file  Social Connections:   . Frequency of Communication with Friends and Family: Not on file  . Frequency of Social Gatherings with Friends and Family: Not on file  . Attends Religious Services: Not on file  . Active Member of Clubs or Organizations: Not on file  . Attends Archivist Meetings: Not on file  . Marital Status: Not on file  Intimate Partner Violence:   . Fear of Current or Ex-Partner: Not on file  . Emotionally Abused: Not on file  . Physically Abused: Not on file  . Sexually Abused: Not on file    Family History  Problem Relation Age of Onset  . Heart disease Maternal Grandmother        Pacemaker, MI  . Stroke Mother   . Other Father        Deceased, car fell on him  . Diabetes Sister   . Hypertension Sister     ROS: no fevers or chills, productive cough, hemoptysis, dysphasia, odynophagia, melena, hematochezia, dysuria, hematuria, rash, seizure activity, orthopnea, PND, pedal edema, claudication.  Remaining systems are negative.  Physical Exam: Well-developed well-nourished in no acute distress.  Skin is warm and dry.  HEENT is normal.  Neck is supple.  Chest is clear to auscultation with normal expansion.  Cardiovascular exam is regular rate and rhythm.  Abdominal exam nontender or distended. No masses palpated. Extremities show no edema. neuro grossly intact  ECG-sinus rhythm with ventricular pacing and PVCs.  Personally reviewed  A/P  1 NICM-continue entresto and carvedilol.  2 Chronic combined  systolic/diastolic CHF-Volume status stable today; continue present dose of diuretic.   3 hypertension-blood pressure controlled. Continue present medical regimen.  4 CRT-P-patient is followed by electrophysiology.  5 morbid obesity-we discussed the importance of diet, exercise and weight loss.  He has lost 25 pounds recently.  Kirk Ruths, MD

## 2019-07-13 ENCOUNTER — Ambulatory Visit: Payer: Medicare Other | Attending: Internal Medicine

## 2019-07-13 DIAGNOSIS — Z23 Encounter for immunization: Secondary | ICD-10-CM

## 2019-07-13 NOTE — Progress Notes (Signed)
   Covid-19 Vaccination Clinic  Name:  William Villanueva    MRN: [id number] DOB: 05-30-1950  07/13/2019  [name] was observed post Covid-19 immunization for 15 minutes without incident. He was provided with Vaccine Information Sheet and instruction to access the V-Safe system.   [name] was instructed to call 911 with any severe reactions post vaccine: [name] Difficulty breathing  . Swelling of face and throat  . A fast heartbeat  . A bad rash all over body  . Dizziness and weakness   Immunizations Administered    Name Date Dose VIS Date Route   Pfizer COVID-19 Vaccine 07/13/2019  8:15 AM 0.3 mL 04/18/2019 Intramuscular   Manufacturer: [place]   Lot: [id number]   [place]: [id number]

## 2019-07-16 ENCOUNTER — Encounter: Payer: Self-pay | Admitting: Cardiology

## 2019-07-16 ENCOUNTER — Ambulatory Visit: Payer: Medicare Other | Admitting: Cardiology

## 2019-07-16 ENCOUNTER — Other Ambulatory Visit: Payer: Self-pay

## 2019-07-16 VITALS — BP 106/64 | HR 90 | Ht 70.0 in | Wt 294.8 lb

## 2019-07-16 DIAGNOSIS — I10 Essential (primary) hypertension: Secondary | ICD-10-CM | POA: Diagnosis not present

## 2019-07-16 DIAGNOSIS — I509 Heart failure, unspecified: Secondary | ICD-10-CM | POA: Diagnosis not present

## 2019-07-16 DIAGNOSIS — I428 Other cardiomyopathies: Secondary | ICD-10-CM

## 2019-07-16 NOTE — Patient Instructions (Signed)
Medication Instructions:  NO CHANGE *If you need a refill on your cardiac medications before your next appointment, please call your pharmacy*   Lab Work: If you have labs (blood work) drawn today and your tests are completely normal, you will receive your results only by: [name] MyChart Message (if you have MyChart) OR . A paper copy in the mail If you have any lab test that is abnormal or we need to change your treatment, we will call you to review the results.   Follow-Up: At [facility], you and your health needs are our priority.  As part of our continuing mission to provide you with exceptional heart care, we have created designated Provider Care Teams.  These Care Teams include your primary Cardiologist (physician) and Advanced Practice Providers (APPs -  Physician Assistants and Nurse Practitioners) who all work together to provide you with the care you need, when you need it.  We recommend signing up for the patient portal called "MyChart".  Sign up information is provided on this After Visit Summary.  MyChart is used to connect with patients for Virtual Visits (Telemedicine).  Patients are able to view lab/test results, encounter notes, upcoming appointments, etc.  Non-urgent messages can be sent to your provider as well.   To learn more about what you can do with MyChart, go to [email or web address].    Your next appointment:   6 month(s)  The format for your next appointment:   Either In Person or Virtual  Provider:   Kirk Ruths, MD

## 2019-07-31 ENCOUNTER — Encounter: Payer: Self-pay | Admitting: *Deleted

## 2019-08-07 ENCOUNTER — Telehealth: Payer: Self-pay | Admitting: Cardiology

## 2019-08-07 ENCOUNTER — Other Ambulatory Visit: Payer: Self-pay | Admitting: Cardiology

## 2019-08-07 DIAGNOSIS — R0602 Shortness of breath: Secondary | ICD-10-CM

## 2019-08-07 NOTE — Telephone Encounter (Signed)
Patient called and wanted to speak with Hilda Blades about some paperwork. He would like Hilda Blades to call him  Just as soon as she can

## 2019-08-07 NOTE — Telephone Encounter (Signed)
Message routed to [employer] concerning paperwork

## 2019-08-08 NOTE — Telephone Encounter (Signed)
Left message for pt to call.

## 2019-08-08 NOTE — Telephone Encounter (Signed)
Spoke with [employer] patient assistance, the patient was approved yesterday and will complete the process today and get his medications in the mail.

## 2019-08-09 ENCOUNTER — Emergency Department (HOSPITAL_BASED_OUTPATIENT_CLINIC_OR_DEPARTMENT_OTHER): Payer: Medicare Other

## 2019-08-09 ENCOUNTER — Encounter (HOSPITAL_BASED_OUTPATIENT_CLINIC_OR_DEPARTMENT_OTHER): Payer: Self-pay | Admitting: Emergency Medicine

## 2019-08-09 ENCOUNTER — Emergency Department (HOSPITAL_BASED_OUTPATIENT_CLINIC_OR_DEPARTMENT_OTHER)
Admission: EM | Admit: 2019-08-09 | Discharge: 2019-08-09 | Disposition: A | Discharge status: Home / Self Care | Payer: Medicare Other | Attending: Emergency Medicine | Admitting: Emergency Medicine

## 2019-08-09 ENCOUNTER — Other Ambulatory Visit: Payer: Self-pay

## 2019-08-09 DIAGNOSIS — S63502A Unspecified sprain of left wrist, initial encounter: Secondary | ICD-10-CM | POA: Insufficient documentation

## 2019-08-09 DIAGNOSIS — W010XXA Fall on same level from slipping, tripping and stumbling without subsequent striking against object, initial encounter: Secondary | ICD-10-CM | POA: Diagnosis not present

## 2019-08-09 DIAGNOSIS — S6992XA Unspecified injury of left wrist, hand and finger(s), initial encounter: Secondary | ICD-10-CM | POA: Diagnosis present

## 2019-08-09 DIAGNOSIS — Z79899 Other long term (current) drug therapy: Secondary | ICD-10-CM | POA: Insufficient documentation

## 2019-08-09 DIAGNOSIS — I110 Hypertensive heart disease with heart failure: Secondary | ICD-10-CM | POA: Diagnosis not present

## 2019-08-09 DIAGNOSIS — Z7982 Long term (current) use of aspirin: Secondary | ICD-10-CM | POA: Insufficient documentation

## 2019-08-09 DIAGNOSIS — Z87891 Personal history of nicotine dependence: Secondary | ICD-10-CM | POA: Insufficient documentation

## 2019-08-09 DIAGNOSIS — Z950 Presence of cardiac pacemaker: Secondary | ICD-10-CM | POA: Insufficient documentation

## 2019-08-09 DIAGNOSIS — J449 Chronic obstructive pulmonary disease, unspecified: Secondary | ICD-10-CM | POA: Diagnosis not present

## 2019-08-09 DIAGNOSIS — Y92481 Parking lot as the place of occurrence of the external cause: Secondary | ICD-10-CM | POA: Diagnosis not present

## 2019-08-09 DIAGNOSIS — Y9301 Activity, walking, marching and hiking: Secondary | ICD-10-CM | POA: Diagnosis not present

## 2019-08-09 DIAGNOSIS — T148XXA Other injury of unspecified body region, initial encounter: Secondary | ICD-10-CM

## 2019-08-09 DIAGNOSIS — I5042 Chronic combined systolic (congestive) and diastolic (congestive) heart failure: Secondary | ICD-10-CM | POA: Diagnosis not present

## 2019-08-09 DIAGNOSIS — Z23 Encounter for immunization: Secondary | ICD-10-CM | POA: Insufficient documentation

## 2019-08-09 DIAGNOSIS — Y999 Unspecified external cause status: Secondary | ICD-10-CM | POA: Insufficient documentation

## 2019-08-09 MED ORDER — TETANUS-DIPHTH-ACELL PERTUSSIS 5-2.5-18.5 LF-MCG/0.5 IM SUSP
0.5000 mL | Freq: Once | INTRAMUSCULAR | Status: AC
  Administered 2019-08-09: 0.5 mL via INTRAMUSCULAR
  Filled 2019-08-09: qty 0.5

## 2019-08-09 NOTE — ED Provider Notes (Addendum)
[facility] EMERGENCY DEPARTMENT Provider Note   CSN: [id number] Arrival date & time: 08/09/19  0859     History Chief Complaint  Patient presents with  . Wrist Pain  . Fall    William Villanueva is a 69 y.o. male.  Patient is a 69 year old male who presents with left wrist pain.  He states he was walking outside from a restaurant last night and tripped on a handicap ramp.  He fell forward injuring his left wrist.  He has some abrasions on his knee but denies any pain related to this.  He is not sure when his last tetanus shot was.  He also has some pain in his back but he says that is chronic and does not feel like he injured his back in the fall.  He denies any neck pain.  No head injury.  He did not hit his head or lose consciousness.  He is on baby aspirin but no other anticoagulants.  He has had constant throbbing pain to his left wrist since the incident.  He denies any other injuries.        Past Medical History:  Diagnosis Date  . AV block, 2nd degree 2015   St. Jude Allure Quadra pulse generator [id number], model PM 3242  . Back pain   . Cardiomyopathy ([place])    Nonischemic 45%.   . CHF (congestive heart failure) ([place])   . Gout   . Hemochromatosis   . Hypertension   . Hypospadias 09/18/1950   born with  . Nephrolithiasis   . Polycythemia vera(238.4)     Patient Active Problem List   Diagnosis Date Noted  . CHF (congestive heart failure) ([place]) 07/17/2017  . COPD (chronic obstructive pulmonary disease) ([place]) 06/14/2017  . Acute renal failure (ARF) ([place]) 05/27/2016  . Hyponatremia 05/27/2016  . Hypokalemia 05/27/2016  . AKI (acute kidney injury) ([place]) 05/26/2016  . Depression 04/05/2016  . Kidney stones 04/05/2016  . Osteoarthritis 04/05/2016  . Sleep apnea 04/05/2016  . Bilateral carpal tunnel syndrome 12/02/2015  . Chronic combined systolic and diastolic congestive heart failure ([place]) 04/13/2015  . Orthostatic hypotension 01/19/2015  . Obesity  11/26/2014  . Enlarged prostate without lower urinary tract symptoms (luts) 10/29/2014  . Essential hypertension 10/29/2014  . GERD (gastroesophageal reflux disease) 10/29/2014  . Panic attack 10/29/2014  . Changing skin lesion 10/16/2014  . Acute on chronic combined systolic and diastolic congestive heart failure ([place]) 10/14/2014  . Renal cyst 09/02/2014  . Pacemaker lead malfunction-elevated threshold RV lead 07/31/2014  . Dyspnea on exertion 06/21/2014  . Noninfective gastroenteritis and colitis 06/15/2014  . 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 Palpitations 06/11/2014  . 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  . Other cardiomyopathies ([place]) 04/29/2014  . Bradycardia 04/28/2014  . Chronic pain 02/12/2014  . 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 Muscular wasting and disuse atrophy 08/15/2013  . Lumbar and sacral osteoarthritis 05/19/2013  . Congestive dilated cardiomyopathy ([place]) 01/11/2012  . Compulsive tobacco user syndrome 12/13/2011  . Current tobacco use 12/13/2011  . Chest tightness   . Rash   . Back pain   . Hemochromatosis   . SOB (shortness of breath)   . Gout   . Polycythemia vera ([place]) 03/27/2011    Past Surgical History:  Procedure Laterality Date  . ANKLE SURGERY    . BI-VENTRICULAR PACEMAKER INSERTION N/A 04/29/2014   Procedure: BI-VENTRICULAR PACEMAKER INSERTION (CRT-P);  Surgeon: Deboraha Sprang, MD; Laterality: Left  St. Jude Allure Quadra pulse generator [id number], model PM  3242  . PILONIDAL CYST EXCISION    . POSTERIOR LAMINECTOMY / DECOMPRESSION LUMBAR SPINE    . TONSILLECTOMY         Family History  Problem Relation Age of Onset  . Heart disease Maternal Grandmother        Pacemaker, MI  . Stroke Mother   . Other Father        Deceased, car fell on him  . Diabetes Sister   . Hypertension Sister     Social History   Tobacco Use  . Smoking status: Former Smoker    Packs/day: 1.00    Years: 44.00    Pack years: 44.00    Types: Cigarettes     Start date: 06/05/1968    Quit date: 04/05/2013    Years since quitting: 6.3  . Smokeless tobacco: Never Used  . Tobacco comment: quit 2 years ago  Substance Use Topics  . Alcohol use: Yes    Alcohol/week: 0.0 standard drinks    Comment: rare  . Drug use: No    Home Medications Prior to Admission medications   Medication Sig Start Date End Date Taking? Authorizing Provider  aspirin 81 MG tablet Take 81 mg by mouth daily.    [provider]  BD PEN NEEDLE NANO U/F 32G X 4 MM MISC  06/16/18   [provider]  carvedilol (COREG) 12.5 MG tablet Take 1 tablet (12.5 mg total) by mouth 2 (two) times daily with a meal. 05/08/19   Crenshaw, Denice Bors, MD  citalopram (CELEXA) 10 MG tablet Take 5 mg by mouth daily.  01/27/16   [provider]  ENTRESTO 24-26 MG TAKE ONE TABLET BY MOUTH TWICE A DAY 07/09/19   Lelon Perla, MD  febuxostat (ULORIC) 40 MG tablet Take 80 mg by mouth daily.    [provider]  folic acid (FOLVITE) 1 MG tablet Take 1 mg by mouth daily.    [provider]  furosemide (LASIX) 20 MG tablet TAKE ONE TABLET BY MOUTH DAILY 08/07/19   Lelon Perla, MD  hydroxyurea (HYDREA) 500 MG capsule Take 2 capsules (1,000 mg total) by mouth daily. 12/09/18   Volanda Napoleon, MD  insulin degludec (TRESIBA FLEXTOUCH) 100 UNIT/ML SOPN FlexTouch Pen Inject 20 Units into the skin daily.    [provider]  Methotrexate Sodium (METHOTREXATE, PF,) 250 MG/10ML injection Inject 6 mg into the vein once a week.    [provider]  omeprazole (PRILOSEC) 20 MG capsule Take 20 mg by mouth 2 (two) times daily before a meal.  07/05/16   [provider]  rosuvastatin (CRESTOR) 5 MG tablet Take 5 mg by mouth daily.    [provider]  Spacer/Aero-Holding Chambers (AEROCHAMBER MV) inhaler Use as instructed 01/09/18   Collene Gobble, MD  spironolactone (ALDACTONE) 25 MG tablet Take 0.5 tablets (12.5 mg total) by mouth daily.  01/08/19   Lelon Perla, MD  tamsulosin (FLOMAX) 0.4 MG CAPS capsule Take 0.4 mg by mouth. 08/03/17   [provider]    Allergies    Bupropion, Fluoxetine, Ibuprofen, Prednisone, Temazepam, Trazodone and nefazodone, Cephalexin, Fluoxetine hcl, and Prozac [fluoxetine hcl]  Review of Systems   Review of Systems  Constitutional: Negative for chills, diaphoresis, fatigue and fever.  HENT: Negative for congestion, rhinorrhea and sneezing.   Eyes: Negative.   Respiratory: Negative for cough, chest tightness and shortness of breath.   Cardiovascular: Negative for chest pain and leg swelling.  Gastrointestinal:  Negative for abdominal pain, blood in stool, diarrhea, nausea and vomiting.  Genitourinary: Negative for difficulty urinating, flank pain, frequency and hematuria.  Musculoskeletal: Positive for arthralgias and back pain (chronic).  Skin: Positive for wound. Negative for rash.  Neurological: Negative for dizziness, speech difficulty, weakness, numbness and headaches.    Physical Exam Updated Vital Signs BP 132/86 (BP Location: Right Arm)   Pulse 79   Temp 98 F (36.7 C) (Oral)   Resp 16   SpO2 96%   Physical Exam Constitutional:      Appearance: He is well-developed.  HENT:     Head: Normocephalic and atraumatic.     Mouth/Throat:     Mouth: Mucous membranes are moist.  Neck:     Comments: No pain along the cervical or thoracic spine.  He has some tenderness to the lower lumbar spine and evidence of prior surgery but he says this is chronic and unchanged from his baseline Cardiovascular:     Rate and Rhythm: Normal rate.  Pulmonary:     Effort: Pulmonary effort is normal.  Musculoskeletal:        General: Tenderness present.     Cervical back: Normal range of motion and neck supple.  Skin:    General: Skin is warm and dry.  Neurological:     Mental Status: He is alert and oriented to person, place, and time.     ED Results / Procedures / Treatments    Labs (all labs ordered are listed, but only abnormal results are displayed) Labs Reviewed - No data to display  EKG None  Radiology DG Wrist Complete Left  Result Date: 08/09/2019 CLINICAL DATA:  LEFT wrist pain after fall last night. EXAM: LEFT WRIST - COMPLETE 3+ VIEW COMPARISON:  None. FINDINGS: No evidence of acute fracture. Radiocarpal alignment is normal. There is widening of the scapholunate joint space indicating disruption of the scapholunate ligament, of uncertain age but likely chronic. There is degenerative osteoarthritic changes at the first [facility] joint, at least moderate in degree with associated joint space narrowing and osseous spurring. Additional osseous spurring seen at the articular surface of the LEFT radius. IMPRESSION: 1. No acute findings. No evidence of acute fracture or dislocation. 2. Degenerative osteoarthritic changes at the first [facility] joint, at least moderate in degree. 3. Evidence of scapholunate ligament disruption, resulting in widening of the scapholunate joint space, likely chronic. Electronically Signed   By: Franki Cabot M.D.   On: 08/09/2019 09:40    Procedures Procedures (including critical care time)  Medications Ordered in ED Medications  Tdap (BOOSTRIX) injection 0.5 mL (0.5 mLs Intramuscular Given 08/09/19 0930)    ED Course  I have reviewed the triage vital signs and the nursing notes.  Pertinent labs & imaging results that were available during my care of the patient were reviewed by me and considered in my medical decision making (see chart for details).    MDM Rules/Calculators/A&P                      Patient is a 69 year old male who presents after mechanical fall.  He denies any head injury.  Denies any injury to his neck or back.  He has mostly pain to his left wrist.  X-rays do not reveal any evidence of acute fractures.  This was reviewed by me as well as the radiologist.  There is scapholunate joint widening which appears to be old per  the radiology report.  Patient was placed on  the thumb spica and I think it is prudent that he get a reevaluation in about a week.  I will give him a referral to the sports medicine clinic here.  He was placed in a thumb spica in the ED.[name]  Symptomatic care instructions were given.  We did not have any velcro thumb spica splints in his size, so a fiberglass splint was fashioned and placed by me.  SPLINT APPLICATION Date/Time: 0000000 AM Authorized by: Malvin Johns Consent: Verbal consent obtained. Risks and benefits: risks, benefits and alternatives were discussed Consent given by: patient Splint applied by: Me Location details: left arm  Splint type: thumb spica Supplies used: gauze wrap, fiberglass splinting material, ace wrap Post-procedure: The splinted body part was neurovascularly unchanged following the procedure. Patient tolerance: Patient tolerated the procedure well with no immediate complications.    Final Clinical Impression(s) / ED Diagnoses Final diagnoses:  Sprain of left wrist, initial encounter  Abrasion    Rx / DC Orders ED Discharge Orders    None       Malvin Johns, MD 08/09/19 1017    Malvin Johns, MD 08/09/19 1108

## 2019-08-09 NOTE — ED Notes (Signed)
Pt verbalized understanding of dc instructions.

## 2019-08-09 NOTE — ED Triage Notes (Signed)
Pt here with left wrist pain after mechanical fall last night in a parking lot. Some swelling noted.

## 2019-08-12 ENCOUNTER — Encounter: Payer: Self-pay | Admitting: Family Medicine

## 2019-08-12 ENCOUNTER — Other Ambulatory Visit: Payer: Self-pay

## 2019-08-12 ENCOUNTER — Ambulatory Visit: Payer: Medicare Other | Admitting: Family Medicine

## 2019-08-12 VITALS — BP 114/75 | HR 91 | Ht 70.0 in | Wt 290.0 lb

## 2019-08-12 DIAGNOSIS — S63502A Unspecified sprain of left wrist, initial encounter: Secondary | ICD-10-CM | POA: Insufficient documentation

## 2019-08-12 NOTE — Patient Instructions (Signed)
Nice to meet you Please try the brace  Please try ice  Please try tylenol  Please have the xrays performed on Friday or early next week.  I will call with the the results.   Please send me a message in MyChart with any questions or updates.  Please see me back at the end of next week.   --Dr. Raeford Razor

## 2019-08-12 NOTE — Assessment & Plan Note (Signed)
Had a fall on his outstretched hand on 4/2.  Imaging has been negative thus far.  Has significant ecchymosis in the palm of the hand as well as the distal radius. -Transition to a thumb spica splint. -Placed future x-rays to have taken later this week. -Counseled on home exercise therapy and supportive care. -Can follow-up at the end of next week

## 2019-08-12 NOTE — Progress Notes (Signed)
William Villanueva - 69 y.o. male MRN [id number]  Date of birth: 21-Apr-1951  SUBJECTIVE:  Including CC & ROS.  Chief Complaint  Patient presents with  . Wrist Injury    left wrist x 08/08/2019    William Villanueva is a 69 y.o. male that is presenting with left wrist pain and hand pain.  He had a fall while walking outside of a restaurant.  He fell on his hand.  He was seen in the emergency department and imaging was negative for acute fracture.  He was placed in a splint.  Is having pain in the snuffbox and seems to be localized to this area.  No trouble with this area previously.  No numbness or tingling.  Independent review of the left wrist x-ray from 4/3 shows no acute abnormality.  Review of Systems See HPI   HISTORY: Past Medical, Surgical, Social, and Family History Reviewed & Updated per EMR.   Pertinent Historical Findings include:  Past Medical History:  Diagnosis Date  . AV block, 2nd degree 2015   St. Jude Allure Quadra pulse generator [id number], model PM 3242  . Back pain   . Cardiomyopathy ([place])    Nonischemic 45%.   . CHF (congestive heart failure) ([place])   . Gout   . Hemochromatosis   . Hypertension   . Hypospadias 03-12-1951   born with  . Nephrolithiasis   . Polycythemia vera(238.4)     Past Surgical History:  Procedure Laterality Date  . ANKLE SURGERY    . BI-VENTRICULAR PACEMAKER INSERTION N/A 04/29/2014   Procedure: BI-VENTRICULAR PACEMAKER INSERTION (CRT-P);  Surgeon: Deboraha Sprang, MD; Laterality: Left  St. Jude Allure Quadra pulse generator [phone], model PM [id number]  . PILONIDAL CYST EXCISION    . POSTERIOR LAMINECTOMY / DECOMPRESSION LUMBAR SPINE    . TONSILLECTOMY      Family History  Problem Relation Age of Onset  . Heart disease Maternal Grandmother        Pacemaker, MI  . Stroke Mother   . Other Father        Deceased, car fell on him  . Diabetes Sister   . Hypertension Sister     Social History   Socioeconomic History  . Marital status:  Married    Spouse name: Not on file  . Number of children: Not on file  . Years of education: Not on file  . Highest education level: Not on file  Occupational History  . Not on file  Tobacco Use  . Smoking status: Former Smoker    Packs/day: 1.00    Years: 44.00    Pack years: 44.00    Types: Cigarettes    Start date: 06/05/1968    Quit date: 04/05/2013    Years since quitting: 6.3  . Smokeless tobacco: Never Used  . Tobacco comment: quit 2 years ago  Substance and Sexual Activity  . Alcohol use: Yes    Alcohol/week: 0.0 standard drinks    Comment: rare  . Drug use: No  . Sexual activity: Not Currently  Other Topics Concern  . Not on file  Social History Narrative   Lives at home with wife in a one story home.  Has no children.  Does not work.  Getting workman's comp.  Education: 4 years trade school.    Social Determinants of Health   Financial Resource Strain:   . Difficulty of Paying Living Expenses:   Food Insecurity:   . Worried About Charity fundraiser in the  Last Year:   . [place] in the Last Year:   Transportation Needs:   . Film/video editor (Medical):   [name] Lack of Transportation (Non-Medical):   Physical Activity:   . Days of Exercise per Week:   . Minutes of Exercise per Session:   Stress:   . Feeling of Stress :   Social Connections:   . Frequency of Communication with Friends and Family:   . Frequency of Social Gatherings with Friends and Family:   . Attends Religious Services:   . Active Member of Clubs or Organizations:   . Attends Archivist Meetings:   [name] Marital Status:   Intimate Partner Violence:   . Fear of Current or Ex-Partner:   . Emotionally Abused:   [name] Physically Abused:   . Sexually Abused:      PHYSICAL EXAM:  VS: BP 114/75   Pulse 91   Ht 5' 10"  (1.778 m)   Wt 290 lb (131.5 kg)   BMI 41.61 kg/m  Physical Exam Gen: NAD, alert, cooperative with exam, well-appearing MSK:  Left hand/wrist: Ecchymosis  occurring over the palmar aspect as well as the distal wrist and radius. Limited flexion and extension of the wrist secondary to pain. Normal extension and abduction of the thumb. Normal finger abduction and adduction  Normal grip strength. No instability with valgus or varus stress testing of the thumb. Tenderness to palpation in the snuffbox. Neurovascular intact     ASSESSMENT & PLAN:   Sprain of left wrist Had a fall on his outstretched hand on 4/2.  Imaging has been negative thus far.  Has significant ecchymosis in the palm of the hand as well as the distal radius. -Transition to a thumb spica splint. -Placed future x-rays to have taken later this week. -Counseled on home exercise therapy and supportive care. -Can follow-up at the end of next week

## 2019-08-15 ENCOUNTER — Other Ambulatory Visit: Payer: Self-pay

## 2019-08-15 ENCOUNTER — Ambulatory Visit (HOSPITAL_BASED_OUTPATIENT_CLINIC_OR_DEPARTMENT_OTHER)
Admission: RE | Admit: 2019-08-15 | Discharge: 2019-08-15 | Disposition: A | Discharge status: Home / Self Care | Payer: Medicare Other | Source: Ambulatory Visit | Attending: Family Medicine | Admitting: Family Medicine

## 2019-08-15 DIAGNOSIS — S63502A Unspecified sprain of left wrist, initial encounter: Secondary | ICD-10-CM | POA: Diagnosis present

## 2019-08-18 ENCOUNTER — Telehealth: Payer: Self-pay | Admitting: Family Medicine

## 2019-08-18 NOTE — Telephone Encounter (Signed)
Informed of results. He will follow up as needed as he reports improvement.   Rosemarie Ax, MD [facility] 08/18/2019, 9:23 AM

## 2019-08-19 ENCOUNTER — Ambulatory Visit: Payer: Medicare Other | Admitting: Family Medicine

## 2019-09-04 ENCOUNTER — Other Ambulatory Visit: Payer: Self-pay | Admitting: Hematology & Oncology

## 2019-09-04 DIAGNOSIS — D45 Polycythemia vera: Secondary | ICD-10-CM

## 2019-09-09 ENCOUNTER — Other Ambulatory Visit: Payer: Self-pay | Admitting: Hematology & Oncology

## 2019-09-09 DIAGNOSIS — D45 Polycythemia vera: Secondary | ICD-10-CM

## 2019-09-18 ENCOUNTER — Ambulatory Visit (INDEPENDENT_AMBULATORY_CARE_PROVIDER_SITE_OTHER): Payer: Medicare Other | Admitting: *Deleted

## 2019-09-18 ENCOUNTER — Telehealth: Payer: Self-pay

## 2019-09-18 DIAGNOSIS — I441 Atrioventricular block, second degree: Secondary | ICD-10-CM | POA: Diagnosis not present

## 2019-09-18 DIAGNOSIS — I420 Dilated cardiomyopathy: Secondary | ICD-10-CM

## 2019-09-18 LAB — CUP PACEART REMOTE DEVICE CHECK
Battery Remaining Longevity: 55 mo
Battery Remaining Percentage: 80 %
Battery Voltage: 2.93 V
Brady Statistic AP VP Percent: 12 %
Brady Statistic AP VS Percent: 1 %
Brady Statistic AS VP Percent: 85 %
Brady Statistic AS VS Percent: 1 %
Brady Statistic RA Percent Paced: 10 %
Date Time Interrogation Session: 20210513020012
Implantable Lead Implant Date: 20151223
Implantable Lead Implant Date: 20151223
Implantable Lead Implant Date: 20151223
Implantable Lead Location: 753858
Implantable Lead Location: 753859
Implantable Lead Location: 753860
Implantable Pulse Generator Implant Date: 20151223
Lead Channel Impedance Value: 410 Ohm
Lead Channel Impedance Value: 440 Ohm
Lead Channel Impedance Value: 600 Ohm
Lead Channel Pacing Threshold Amplitude: 0.75 V
Lead Channel Pacing Threshold Amplitude: 1.125 V
Lead Channel Pacing Threshold Amplitude: 1.375 V
Lead Channel Pacing Threshold Pulse Width: 0.4 ms
Lead Channel Pacing Threshold Pulse Width: 0.4 ms
Lead Channel Pacing Threshold Pulse Width: 0.7 ms
Lead Channel Sensing Intrinsic Amplitude: 4.3 mV
Lead Channel Sensing Intrinsic Amplitude: 6.3 mV
Lead Channel Setting Pacing Amplitude: 2.125
Lead Channel Setting Pacing Amplitude: 2.375
Lead Channel Setting Pacing Amplitude: 5 V
Lead Channel Setting Pacing Pulse Width: 0.4 ms
Lead Channel Setting Pacing Pulse Width: 0.7 ms
Lead Channel Setting Sensing Sensitivity: 2 mV
Pulse Gen Model: 3242
Pulse Gen Serial Number: 7701275

## 2019-09-18 NOTE — Telephone Encounter (Signed)
Scheduled remote transmission received showing Atrial cap confirm in high output at 5 volts.  Reviewed with [facility] rep, pt needs to come into office for testing/ reprogramming.  Pt is only atrially pacing 10% of time therefore appt can be non urgent.   Attempted to reach pt, no answer.  DPR on file.  Left detailed message requesting patient call back and advising scheduling will be contacting.

## 2019-09-19 NOTE — Telephone Encounter (Signed)
Patient scheduled for appointment with A. Tillery, PA-C, on 09/22/19.

## 2019-09-19 NOTE — Progress Notes (Signed)
Remote pacemaker transmission.   

## 2019-09-21 NOTE — Progress Notes (Signed)
Electrophysiology Office Note Date: 09/22/2019  ID:  William Villanueva, DOB 08/02/1950, MRN [id number]  PCP: Antony Contras, MD Primary Cardiologist: No primary care provider on file. Electrophysiologist: Dr. Caryl Comes   CC: Pacemaker follow-up  William Villanueva is a 69 y.o. male seen today for Dr. Caryl Comes for acute visit due to elevated RA thresholds on recent remote report.  Since last being seen in our clinic the patient reports doing well overall. He has chronic mild dyspnea with moderate exertion. He has occasional "twinges" of chest pain when rolling over in bed. These are short. Not related to food or any other activity that he knows of. Denies exertional chest pain. He denies palpitations, PND, orthopnea, nausea, vomiting, dizziness, syncope, edema, weight gain, or early satiety.  Device History: St. Jude BiV PPM implanted 04/29/2014 for chronic systolic CHF/NICM  Past Medical History:  Diagnosis Date  . AV block, 2nd degree 2015   St. Jude Allure Quadra pulse generator [id number], model PM [id number]  . Back pain   . Cardiomyopathy ([place])    Nonischemic 45%.   . CHF (congestive heart failure) ([place])   . Gout   . Hemochromatosis   . Hypertension   . Hypospadias 10-06-1950   born with  . Nephrolithiasis   . 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 ANKLE SURGERY    . BI-VENTRICULAR PACEMAKER INSERTION N/A 04/29/2014   Procedure: BI-VENTRICULAR PACEMAKER INSERTION (CRT-P);  Surgeon: Deboraha Sprang, MD; Laterality: Left  St. Jude Allure Quadra pulse generator [id number], model PM [phone]  . PILONIDAL CYST EXCISION    . POSTERIOR LAMINECTOMY / DECOMPRESSION LUMBAR SPINE    . TONSILLECTOMY      Current Outpatient Medications  Medication Sig Dispense Refill  . aspirin 81 MG tablet Take 81 mg by mouth daily.    . BD PEN NEEDLE NANO U/F 32G X 4 MM MISC     . carvedilol (COREG) 12.5 MG tablet Take 1 tablet (12.5 mg total) by mouth 2 (two) times daily with  a meal. 180 tablet 3  . citalopram (CELEXA) 10 MG tablet Take 5 mg by mouth daily.     [name] ENTRESTO 24-26 MG TAKE ONE TABLET BY MOUTH TWICE A DAY 60 tablet 4  . febuxostat (ULORIC) 40 MG tablet Take 80 mg by mouth daily.    . folic acid (FOLVITE) 1 MG tablet Take 1 mg by mouth daily.    . furosemide (LASIX) 20 MG tablet TAKE ONE TABLET BY MOUTH DAILY 90 tablet 1  . hydroxyurea (HYDREA) 500 MG capsule TAKE TWO CAPSULES BY MOUTH DAILY 180 capsule 5  . insulin degludec (TRESIBA FLEXTOUCH) 100 UNIT/ML SOPN FlexTouch Pen Inject 20 Units into the skin daily.    . Methotrexate Sodium (METHOTREXATE, PF,) 250 MG/10ML injection Inject 6 mg into the vein once a week.    [name] omeprazole (PRILOSEC) 20 MG capsule Take 20 mg by mouth 2 (two) times daily before a meal.     . rosuvastatin (CRESTOR) 5 MG tablet Take 5 mg by mouth daily.    [name] Spacer/Aero-Holding Chambers (AEROCHAMBER MV) inhaler Use as instructed 1 each 0  . spironolactone (ALDACTONE) 25 MG tablet Take 0.5 tablets (12.5 mg total) by mouth daily. 45 tablet 3  . tamsulosin (FLOMAX) 0.4 MG CAPS capsule Take 0.4 mg by mouth.    [name] allopurinol (ZYLOPRIM) 300 MG tablet Take 300 mg by mouth daily.     No current facility-administered medications for this visit.  Facility-Administered Medications Ordered in Other Visits  Medication Dose Route Frequency Provider Last Rate Last Admin  . 0.9 %  sodium chloride infusion   Intravenous Continuous Volanda Napoleon, MD   Stopped at 05/16/13 1115    Allergies:   Bupropion, Fluoxetine, Ibuprofen, Prednisone, Temazepam, Trazodone and nefazodone, Cephalexin, Fluoxetine hcl, and Prozac [fluoxetine hcl]   Social History: Social History   Socioeconomic History  . Marital status: Married    Spouse name: Not on file  . Number of children: Not on file  . Years of education: Not on file  . Highest education level: Not on file  Occupational History  . Not on file  Tobacco Use  . Smoking status: Former Smoker     Packs/day: 1.00    Years: 44.00    Pack years: 44.00    Types: Cigarettes    Start date: 06/05/1968    Quit date: 04/05/2013    Years since quitting: 6.4  . Smokeless tobacco: Never Used  . Tobacco comment: quit 2 years ago  Substance and Sexual Activity  . Alcohol use: Yes    Alcohol/week: 0.0 standard drinks    Comment: rare  . Drug use: No  . Sexual activity: Not Currently  Other Topics Concern  . Not on file  Social History Narrative   Lives at home with wife in a one story home.  Has no children.  Does not work.  Getting workman's comp.  Education: 4 years trade school.    Social Determinants of Health   Financial Resource Strain:   . Difficulty of Paying Living Expenses:   Food Insecurity:   . Worried About Charity fundraiser in the Last Year:   . Arboriculturist in the Last Year:   Transportation Needs:   . Film/video editor (Medical):   [name] Lack of Transportation (Non-Medical):   Physical Activity:   . Days of Exercise per Week:   . Minutes of Exercise per Session:   Stress:   . Feeling of Stress :   Social Connections:   . Frequency of Communication with Friends and Family:   . Frequency of Social Gatherings with Friends and Family:   . Attends Religious Services:   . Active Member of Clubs or Organizations:   . Attends Archivist Meetings:   [name] Marital Status:   Intimate Partner Violence:   . Fear of Current or Ex-Partner:   . Emotionally Abused:   [name] Physically Abused:   . Sexually Abused:     Family History: Family History  Problem Relation Age of Onset  . Heart disease Maternal Grandmother        Pacemaker, MI  . Stroke Mother   . Other Father        Deceased, car fell on him  . Diabetes Sister   . Hypertension Sister      Review of Systems: All other systems reviewed and are otherwise negative except as noted above.  Physical Exam: Vitals:   09/22/19 0831  BP: 126/62  Pulse: (!) 109  Weight: 300 lb (136.1 kg)  Height: 5'  10" (1.778 m)     GEN- The patient is well appearing, alert and oriented x 3 today.   HEENT: normocephalic, atraumatic; sclera clear, conjunctiva pink; hearing intact; oropharynx clear; neck supple  Lungs- Clear to ausculation bilaterally, normal work of breathing.  No wheezes, rales, rhonchi Heart- Regular rate and rhythm, no murmurs, rubs or gallops  GI- soft, non-tender, non-distended, bowel  sounds present  Extremities- no clubbing, cyanosis, or edema  MS- no significant deformity or atrophy Skin- warm and dry, no rash or lesion; PPM pocket well healed Psych- euthymic mood, full affect Neuro- strength and sensation are intact  PPM Interrogation- reviewed in detail today,  See PACEART report  EKG:  EKG is ordered today. The ekg ordered today shows Sinus tachycardia 109 bpm, QRS 136 bpm, ? V paced  Recent Labs: 06/02/2019: ALT 11; BUN 21; Creatinine 1.18; Hemoglobin 13.7; Platelet Count 427; Potassium 4.2; Sodium 142   Wt Readings from Last 3 Encounters:  09/22/19 300 lb (136.1 kg)  08/12/19 290 lb (131.5 kg)  07/16/19 294 lb 12.8 oz (133.7 kg)     Other studies Reviewed: Additional studies/ records that were reviewed today include: Previous EP office notes, Previous remote checks, Most recent labwork.   Assessment and Plan:  1. NICM s/p St. Jude BiV PPM  Normal PPM function See Pace Art report No changes today RA output fixed with occasional high output by autocapture. Threshold 1.25V @ 0.4 ms today.   2. HTN Continue current regimen  3. Morbidly obese Encouraged weight loss   Current medicines are reviewed at length with the patient today.   The patient does not have concerns regarding his medicines.  The following changes were made today:  none  Labs/ tests ordered today include:  Orders Placed This Encounter  Procedures  . EKG 12-Lead   Disposition:   Follow up with Dr. Caryl Comes in October as scheduled for annual call back/6 month visit with  CRT.  Jacalyn Lefevre, PA-C  09/22/2019 8:41 AM  [facility] [address] [place]  [address] [phone] (office) [phone] (fax)

## 2019-09-22 ENCOUNTER — Ambulatory Visit: Payer: Medicare Other | Admitting: Student

## 2019-09-22 ENCOUNTER — Other Ambulatory Visit: Payer: Self-pay

## 2019-09-22 VITALS — BP 126/62 | HR 109 | Ht 70.0 in | Wt 300.0 lb

## 2019-09-22 DIAGNOSIS — I428 Other cardiomyopathies: Secondary | ICD-10-CM

## 2019-09-22 DIAGNOSIS — I441 Atrioventricular block, second degree: Secondary | ICD-10-CM | POA: Diagnosis not present

## 2019-09-22 DIAGNOSIS — Z950 Presence of cardiac pacemaker: Secondary | ICD-10-CM | POA: Diagnosis not present

## 2019-09-22 DIAGNOSIS — E669 Obesity, unspecified: Secondary | ICD-10-CM | POA: Diagnosis not present

## 2019-09-22 LAB — CUP PACEART INCLINIC DEVICE CHECK
Battery Remaining Longevity: 66 mo
Battery Voltage: 2.93 V
Brady Statistic RA Percent Paced: 9.9 %
Brady Statistic RV Percent Paced: 98 %
Date Time Interrogation Session: 20210517085623
Implantable Lead Implant Date: 20151223
Implantable Lead Implant Date: 20151223
Implantable Lead Implant Date: 20151223
Implantable Lead Location: 753858
Implantable Lead Location: 753859
Implantable Lead Location: 753860
Implantable Pulse Generator Implant Date: 20151223
Lead Channel Impedance Value: 412.5 Ohm
Lead Channel Impedance Value: 437.5 Ohm
Lead Channel Impedance Value: 612.5 Ohm
Lead Channel Pacing Threshold Amplitude: 1 V
Lead Channel Pacing Threshold Amplitude: 1.25 V
Lead Channel Pacing Threshold Amplitude: 1.25 V
Lead Channel Pacing Threshold Amplitude: 1.375 V
Lead Channel Pacing Threshold Pulse Width: 0.4 ms
Lead Channel Pacing Threshold Pulse Width: 0.4 ms
Lead Channel Pacing Threshold Pulse Width: 0.4 ms
Lead Channel Pacing Threshold Pulse Width: 0.7 ms
Lead Channel Sensing Intrinsic Amplitude: 3.5 mV
Lead Channel Sensing Intrinsic Amplitude: 4.7 mV
Lead Channel Setting Pacing Amplitude: 2 V
Lead Channel Setting Pacing Amplitude: 2.375
Lead Channel Setting Pacing Amplitude: 2.5 V
Lead Channel Setting Pacing Pulse Width: 0.4 ms
Lead Channel Setting Pacing Pulse Width: 0.7 ms
Lead Channel Setting Sensing Sensitivity: 2 mV
Pulse Gen Model: 3242
Pulse Gen Serial Number: 7701275

## 2019-09-22 NOTE — Patient Instructions (Addendum)
Medication Instructions:  none *If you need a refill on your cardiac medications before your next appointment, please call your pharmacy*   Lab Work: none If you have labs (blood work) drawn today and your tests are completely normal, you will receive your results only by: [name] MyChart Message (if you have MyChart) OR . A paper copy in the mail If you have any lab test that is abnormal or we need to change your treatment, we will call you to review the results.   Testing/Procedures: none   Follow-Up: October with Dr Caryl Comes At [facility], you and your health needs are our priority.  As part of our continuing mission to provide you with exceptional heart care, we have created designated Provider Care Teams.  These Care Teams include your primary Cardiologist (physician) and Advanced Practice Providers (APPs -  Physician Assistants and Nurse Practitioners) who all work together to provide you with the care you need, when you need it.     Other Instructions Remote monitoring is used to monitor your Pacemaker from home. This monitoring reduces the number of office visits required to check your device to one time per year. It allows [place] to keep an eye on the functioning of your device to ensure it is working properly. You are scheduled for a device check from home on 12/18/19. You may send your transmission at any time that day. If you have a wireless device, the transmission will be sent automatically. After your physician reviews your transmission, you will receive a postcard with your next transmission date.

## 2019-09-29 ENCOUNTER — Other Ambulatory Visit: Payer: Self-pay

## 2019-09-29 ENCOUNTER — Inpatient Hospital Stay (HOSPITAL_BASED_OUTPATIENT_CLINIC_OR_DEPARTMENT_OTHER): Payer: Medicare Other | Admitting: Hematology & Oncology

## 2019-09-29 ENCOUNTER — Inpatient Hospital Stay: Payer: Medicare Other

## 2019-09-29 ENCOUNTER — Inpatient Hospital Stay: Payer: Medicare Other | Attending: Hematology & Oncology

## 2019-09-29 VITALS — BP 151/66 | HR 79 | Temp 97.5°F | Resp 19 | Wt 301.0 lb

## 2019-09-29 DIAGNOSIS — D45 Polycythemia vera: Secondary | ICD-10-CM

## 2019-09-29 DIAGNOSIS — Z7982 Long term (current) use of aspirin: Secondary | ICD-10-CM | POA: Insufficient documentation

## 2019-09-29 DIAGNOSIS — I441 Atrioventricular block, second degree: Secondary | ICD-10-CM | POA: Diagnosis not present

## 2019-09-29 LAB — CBC WITH DIFFERENTIAL (CANCER CENTER ONLY)
Abs Immature Granulocytes: 0.07 10*3/uL (ref 0.00–0.07)
Basophils Absolute: 0.1 10*3/uL (ref 0.0–0.1)
Basophils Relative: 2 %
Eosinophils Absolute: 0.1 10*3/uL (ref 0.0–0.5)
Eosinophils Relative: 2 %
HCT: 41.7 % (ref 39.0–52.0)
Hemoglobin: 13.3 g/dL (ref 13.0–17.0)
Immature Granulocytes: 1 %
Lymphocytes Relative: 18 %
Lymphs Abs: 1.2 10*3/uL (ref 0.7–4.0)
MCH: 33.1 pg (ref 26.0–34.0)
MCHC: 31.9 g/dL (ref 30.0–36.0)
MCV: 103.7 fL — ABNORMAL HIGH (ref 80.0–100.0)
Monocytes Absolute: 0.7 10*3/uL (ref 0.1–1.0)
Monocytes Relative: 10 %
Neutro Abs: 4.3 10*3/uL (ref 1.7–7.7)
Neutrophils Relative %: 67 %
Platelet Count: 303 10*3/uL (ref 150–400)
RBC: 4.02 MIL/uL — ABNORMAL LOW (ref 4.22–5.81)
RDW: 15.9 % — ABNORMAL HIGH (ref 11.5–15.5)
WBC Count: 6.4 10*3/uL (ref 4.0–10.5)
nRBC: 0 % (ref 0.0–0.2)

## 2019-09-29 LAB — CMP (CANCER CENTER ONLY)
ALT: 13 U/L (ref 0–44)
AST: 16 U/L (ref 15–41)
Albumin: 3.9 g/dL (ref 3.5–5.0)
Alkaline Phosphatase: 53 U/L (ref 38–126)
Anion gap: 10 (ref 5–15)
BUN: 15 mg/dL (ref 8–23)
CO2: 24 mmol/L (ref 22–32)
Calcium: 8.3 mg/dL — ABNORMAL LOW (ref 8.9–10.3)
Chloride: 108 mmol/L (ref 98–111)
Creatinine: 1.06 mg/dL (ref 0.61–1.24)
GFR, Est AFR Am: >60 mL/min (ref >60)
GFR, Estimated: >60 mL/min (ref >60)
Glucose, Bld: 163 mg/dL — ABNORMAL HIGH (ref 70–99)
Potassium: 3.6 mmol/L (ref 3.5–5.1)
Sodium: 142 mmol/L (ref 135–145)
Total Bilirubin: 0.5 mg/dL (ref 0.3–1.2)
Total Protein: 6.6 g/dL (ref 6.5–8.1)

## 2019-09-29 LAB — IRON AND TIBC
Iron: 34 ug/dL — ABNORMAL LOW (ref 42–163)
Saturation Ratios: 9 % — ABNORMAL LOW (ref 20–55)
TIBC: 389 ug/dL (ref 202–409)
UIBC: 355 ug/dL (ref 117–376)

## 2019-09-29 LAB — FERRITIN: Ferritin: 12 ng/mL — ABNORMAL LOW (ref 24–336)

## 2019-09-29 LAB — LACTATE DEHYDROGENASE: LDH: 275 U/L — ABNORMAL HIGH (ref 98–192)

## 2019-09-29 NOTE — Progress Notes (Signed)
Hematology and Oncology Follow Up Visit  William Villanueva [id number] [birth year] 69 y.o. 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  He looks good.  He did have a fall couple months ago.  Thankfully, he did not break anything.  He really has done quite nicely since we made the Hydrea change.  The 1000 mg dose of Hydrea is working nicely for him.  He has not required any phlebotomy for many months.  He says his pacemaker was recalibrated.  He had no problems with this.  He has had no issues with weight gain.  He has not lost any weight.  His weight still is quite high.  He is busy with the great-grandchildren.  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  This is a full-time job.  Hopefully, they will go up to [facility] this summer.  They typically go up there for a crab feast.  Much sure that is going to happen this year.  Thankfully he has not had the coronavirus.  Overall, his performance status is ECOG 1.    Medications:  Current Outpatient Medications:  .  allopurinol (ZYLOPRIM) 300 MG tablet, Take 300 mg by mouth daily., Disp: , Rfl:  .  aspirin 81 MG tablet, Take 81 mg by mouth daily., Disp: , Rfl:  .  BD PEN NEEDLE NANO U/F 32G X 4 MM MISC, , Disp: , Rfl:  .  carvedilol (COREG) 12.5 MG tablet, Take 1 tablet (12.5 mg total) by mouth 2 (two) times daily with a meal., Disp: 180 tablet, Rfl: 3 .  citalopram (CELEXA) 10 MG tablet, Take 5 mg by mouth daily. , Disp: , Rfl:  .  ENTRESTO 24-26 MG, TAKE ONE TABLET BY MOUTH TWICE A DAY, Disp: 60 tablet, Rfl: 4 .  febuxostat (ULORIC) 40 MG tablet, Take 80 mg by mouth daily., Disp: , Rfl:  .  folic acid (FOLVITE) 1 MG tablet, Take 1 mg by mouth daily., Disp: , Rfl:   .  furosemide (LASIX) 20 MG tablet, TAKE ONE TABLET BY MOUTH DAILY, Disp: 90 tablet, Rfl: 1 .  hydroxyurea (HYDREA) 500 MG capsule, TAKE TWO CAPSULES BY MOUTH DAILY, Disp: 180 capsule, Rfl: 5 .  insulin degludec (TRESIBA FLEXTOUCH) 100 UNIT/ML SOPN FlexTouch Pen, Inject 20 Units into the skin daily., Disp: , Rfl:  .  omeprazole (PRILOSEC) 20 MG capsule, Take 20 mg by mouth 2 (two) times daily before a meal. , Disp: , Rfl:  .  rosuvastatin (CRESTOR) 5 MG tablet, Take 5 mg by mouth daily., Disp: , Rfl:  .  Spacer/Aero-Holding Chambers (AEROCHAMBER MV) inhaler, Use as instructed, Disp: 1 each, Rfl: 0 .  spironolactone (ALDACTONE) 25 MG tablet, Take 0.5 tablets (12.5 mg total) by mouth daily., Disp: 45 tablet, Rfl: 3 .  tamsulosin (FLOMAX) 0.4 MG CAPS capsule, Take 0.4 mg by mouth., Disp: , Rfl:  No current facility-administered medications for this visit.  Facility-Administered Medications Ordered in Other Visits:  .  0.9 %  sodium chloride infusion, , Intravenous, Continuous, Vayden Weinand, Rudell Cobb, MD, Stopped at 05/16/13 1115  Allergies:  Allergies  Allergen Reactions  . Bupropion Hives  . Fluoxetine Itching  . Ibuprofen Itching  . Prednisone Itching  and Other (See Comments)    Abdominal pain Other reaction(s): Abdominal Pain   . Temazepam Other (See Comments)    Dizziness   . Trazodone And Nefazodone Other (See Comments)    Dizziness  . Cephalexin Diarrhea    Caused C-diff Caused C-diff   . Fluoxetine Hcl Itching  . Prozac [Fluoxetine Hcl] Itching    Past Medical History, Surgical history, Social history, and Family History were reviewed and updated.  Review of Systems: Review of Systems  Constitutional: Negative.   HENT: Negative.   Eyes: Negative.   Respiratory: Positive for shortness of breath.   Cardiovascular: Positive for chest pain.  Gastrointestinal: Negative.   Genitourinary: Negative.   Musculoskeletal: Positive for back pain.  Skin: Negative.   Neurological:  Negative.   Endo/Heme/Allergies: Negative.   Psychiatric/Behavioral: Negative.     Physical Exam:  weight is 301 lb (136.5 kg) (abnormal). His temporal temperature is 97.5 F (36.4 C) (abnormal). His blood pressure is 151/66 (abnormal) and his pulse is 79. His respiration is 19 and oxygen saturation is 96%.   Physical Exam Vitals reviewed.  HENT:     Head: Normocephalic and atraumatic.  Eyes:     Pupils: Pupils are equal, round, and reactive to light.  Cardiovascular:     Rate and Rhythm: Normal rate and regular rhythm.     Heart sounds: Normal heart sounds.  Pulmonary:     Effort: Pulmonary effort is normal.     Breath sounds: Normal breath sounds.  Abdominal:     General: Bowel sounds are normal.     Palpations: Abdomen is soft.  Musculoskeletal:        General: No tenderness or deformity. Normal range of motion.     Cervical back: Normal range of motion.  Lymphadenopathy:     Cervical: No cervical adenopathy.  Skin:    General: Skin is warm and dry.     Findings: No erythema or rash.  Neurological:     Mental Status: He is alert and oriented to person, place, and time.  Psychiatric:        Behavior: Behavior normal.        Thought Content: Thought content normal.        Judgment: Judgment normal.      Lab Results  Component Value Date   WBC 6.4 09/29/2019   HGB 13.3 09/29/2019   HCT 41.7 09/29/2019   MCV 103.7 (H) 09/29/2019   PLT 303 09/29/2019     Chemistry      Component Value Date/Time   NA 142 09/29/2019 0743   NA 141 04/10/2017 0923   NA 140 04/05/2016 0739   K 3.6 09/29/2019 0743   K 3.5 04/10/2017 0923   K 4.2 04/05/2016 0739   CL 108 09/29/2019 0743   CL 108 04/10/2017 0923   CO2 24 09/29/2019 0743   CO2 24 04/10/2017 0923   CO2 20 (L) 04/05/2016 0739   BUN 15 09/29/2019 0743   BUN 12 04/10/2017 0923   BUN 14.7 04/05/2016 0739   CREATININE 1.06 09/29/2019 0743   CREATININE 1.1 04/10/2017 0923   CREATININE 1.1 04/05/2016 0739       Component Value Date/Time   CALCIUM 8.3 (L) 09/29/2019 0743   CALCIUM 8.4 04/10/2017 0923   CALCIUM 9.0 04/05/2016 0739   ALKPHOS 53 09/29/2019 0743   ALKPHOS 77 04/10/2017 0923   ALKPHOS 91 04/05/2016 0739   AST 16 09/29/2019 0743   AST 34 04/05/2016 0739   ALT  13 09/29/2019 0743   ALT 32 04/10/2017 0923   ALT 40 04/05/2016 0739   BILITOT 0.5 09/29/2019 0743   BILITOT 0.48 04/05/2016 0739      Impression and Plan: William Villanueva is 69 year old gentleman with polycythemia vera.  He does not need to be phlebotomized today.   I think that we can probably keep him at 4 months follow-up.  I think this is very reasonable.   Volanda Napoleon, MD 5/24/20218:42 AM

## 2019-10-14 ENCOUNTER — Other Ambulatory Visit: Payer: Self-pay | Admitting: Cardiology

## 2019-10-14 DIAGNOSIS — I429 Cardiomyopathy, unspecified: Secondary | ICD-10-CM

## 2019-10-14 DIAGNOSIS — R0602 Shortness of breath: Secondary | ICD-10-CM

## 2019-10-14 MED ORDER — FUROSEMIDE 20 MG PO TABS: 20.0000 mg | ORAL_TABLET | Freq: Every day | ORAL | 1 refills | Status: DC

## 2019-10-14 MED ORDER — CARVEDILOL 12.5 MG PO TABS: 12.5000 mg | ORAL_TABLET | Freq: Two times a day (BID) | ORAL | 3 refills | Status: DC

## 2019-10-14 MED ORDER — SPIRONOLACTONE 25 MG PO TABS: 12.5000 mg | ORAL_TABLET | Freq: Every day | ORAL | 3 refills | Status: DC

## 2019-10-14 NOTE — Telephone Encounter (Signed)
°*  STAT* If patient is at the pharmacy, call can be transferred to refill team.   1. Which medications need to be refilled? (please list name of each medication and dose if known)  carvedilol (COREG) 12.5 MG tablet spironolactone (ALDACTONE) 25 MG tablet furosemide (LASIX) 20 MG tablet folic acid (FOLVITE) 1 MG tablet  2. Which pharmacy/location (including street and city if local pharmacy) is medication to be sent to? [facility] [place], [address]  3. Do they need a 30 day or 90 day supply? 90  The patient needs new Rx sent to [facility] for these medications. This is the first time the patient will be filling them through [facility]. The patient said [facility] has been trying to get in touch with Dr. Stanford Breed but has been unsuccessful

## 2019-12-18 ENCOUNTER — Ambulatory Visit (INDEPENDENT_AMBULATORY_CARE_PROVIDER_SITE_OTHER): Payer: Medicare Other | Admitting: *Deleted

## 2019-12-18 DIAGNOSIS — I441 Atrioventricular block, second degree: Secondary | ICD-10-CM | POA: Diagnosis not present

## 2019-12-18 LAB — CUP PACEART REMOTE DEVICE CHECK
Battery Remaining Longevity: 59 mo
Battery Remaining Percentage: 71 %
Battery Voltage: 2.92 V
Brady Statistic AP VP Percent: 7.9 %
Brady Statistic AP VS Percent: 1 %
Brady Statistic AS VP Percent: 90 %
Brady Statistic AS VS Percent: 1 %
Brady Statistic RA Percent Paced: 6.4 %
Date Time Interrogation Session: 20210812020023
Implantable Lead Implant Date: 20151223
Implantable Lead Implant Date: 20151223
Implantable Lead Implant Date: 20151223
Implantable Lead Location: 753858
Implantable Lead Location: 753859
Implantable Lead Location: 753860
Implantable Pulse Generator Implant Date: 20151223
Lead Channel Impedance Value: 430 Ohm
Lead Channel Impedance Value: 440 Ohm
Lead Channel Impedance Value: 640 Ohm
Lead Channel Pacing Threshold Amplitude: 1.25 V
Lead Channel Pacing Threshold Amplitude: 1.25 V
Lead Channel Pacing Threshold Amplitude: 1.375 V
Lead Channel Pacing Threshold Pulse Width: 0.4 ms
Lead Channel Pacing Threshold Pulse Width: 0.4 ms
Lead Channel Pacing Threshold Pulse Width: 0.7 ms
Lead Channel Sensing Intrinsic Amplitude: 3.7 mV
Lead Channel Sensing Intrinsic Amplitude: 8.4 mV
Lead Channel Setting Pacing Amplitude: 2.25 V
Lead Channel Setting Pacing Amplitude: 2.375
Lead Channel Setting Pacing Amplitude: 2.5 V
Lead Channel Setting Pacing Pulse Width: 0.4 ms
Lead Channel Setting Pacing Pulse Width: 0.7 ms
Lead Channel Setting Sensing Sensitivity: 2 mV
Pulse Gen Model: 3242
Pulse Gen Serial Number: 7701275

## 2019-12-22 NOTE — Progress Notes (Signed)
Remote pacemaker transmission.   

## 2019-12-29 ENCOUNTER — Telehealth: Payer: Self-pay | Admitting: Internal Medicine

## 2019-12-29 NOTE — Telephone Encounter (Signed)
Pt c/o Shortness Of Breath: STAT if SOB developed within the last 24 hours or pt is noticeably SOB on the phone  1. Are you currently SOB (can you hear that pt is SOB on the phone)? Spoke to wife  2. How long have you been experiencing SOB? 2-3 days  3. Are you SOB when sitting or when up moving around? both  4. Are you currently experiencing any other symptoms? No.  Wife stated he is coughing a lot, like gaging coughing.

## 2019-12-29 NOTE — Telephone Encounter (Signed)
Follow up   Pt's wife is calling back to follow up, she said pt's SOB is getting worst

## 2019-12-30 NOTE — Telephone Encounter (Signed)
Spoke with pt who sates he has increased SOB x 3 days.  Pt reports SOB is somewhat improved today but would like to send transmission to look at his device.  Pt is unsure how to do this.  Will have device clinic contact him to assist with transmission.  Pt denies current CP or other symptoms at this time.  Reviewed ED precautions with pt.  Pt verbalizes understanding and agrees with current plan.

## 2019-12-30 NOTE — Telephone Encounter (Signed)
Assisted pt with sending manual transmission.  Presenting rhythm AS/BP 120 bpm.  Corvue impedence trending down.    Pt confirmed that he is taking lasix 42m daily.

## 2019-12-30 NOTE — Telephone Encounter (Signed)
Transmission received/reviewed 12/30/19.  AS/BVP @ 107 bpm.   CoreVue Impedance appears to fall below reference impedance which results in increased in fluid volume.

## 2019-12-30 NOTE — Telephone Encounter (Signed)
Spoke with pt and advised per Dr Caryl Comes pt needs to be seen in office.  Appointment scheduled with Chanetta Marshall, 01/02/2020 at 8am.  Reviewed ED precautions again.  Pt verbalizes understanding and agrees with current plan.

## 2020-01-01 NOTE — Progress Notes (Signed)
Electrophysiology Office Note Date: 01/02/2020  ID:  William Villanueva, DOB [birth year], MRN [id number]  PCP: Antony Contras, MD Electrophysiologist: Caryl Comes  CC: shortness of breath   William Villanueva is a 69 y.o. male seen today for Dr Caryl Comes.  He presents today for add on electrophysiology followup.  He has had worsening shortness of breath recently. Over the last week, he has gained 7 pounds and is increasingly short of breath with orthopnea, anorexia and increased abdominal girth. He also has had chest pain that occurs when laying on his left side with non-productive cough.   Device interrogation demonstrated ST with worsening CorVue.  He is added onto my schedule today for evaluation.   He denies chest pain, palpitations, PND, nausea, vomiting, dizziness, syncope.  Device History: STJ CRTP implanted 2015 for CHF, NICM   Past Medical History:  Diagnosis Date  . AV block, 2nd degree 2015   St. Jude Allure Quadra pulse generator [id number], model PM [id number]  . Back pain   . Cardiomyopathy ([place])    Nonischemic 45%.   . CHF (congestive heart failure) ([place])   . Gout   . Hemochromatosis   . Hypertension   . Hypospadias [birth year]   born with  . Nephrolithiasis   . 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 ANKLE SURGERY    . BI-VENTRICULAR PACEMAKER INSERTION N/A 04/29/2014   Procedure: BI-VENTRICULAR PACEMAKER INSERTION (CRT-P);  Surgeon: Deboraha Sprang, MD; Laterality: Left  St. Jude Allure Quadra pulse generator [id number], model PM [phone]  . PILONIDAL CYST EXCISION    . POSTERIOR LAMINECTOMY / DECOMPRESSION LUMBAR SPINE    . TONSILLECTOMY      Current Outpatient Medications  Medication Sig Dispense Refill  . allopurinol (ZYLOPRIM) 300 MG tablet Take 300 mg by mouth daily.    [name] aspirin 81 MG tablet Take 81 mg by mouth daily.    . BD PEN NEEDLE NANO U/F 32G X 4 MM MISC     . carvedilol (COREG) 12.5 MG tablet Take 1 tablet (12.5 mg total) by mouth  2 (two) times daily with a meal. 180 tablet 3  . citalopram (CELEXA) 10 MG tablet Take 5 mg by mouth daily.     [name] ENTRESTO 24-26 MG TAKE ONE TABLET BY MOUTH TWICE A DAY 60 tablet 4  . febuxostat (ULORIC) 40 MG tablet Take 80 mg by mouth daily.    . folic acid (FOLVITE) 1 MG tablet Take 1 mg by mouth daily.    . hydroxyurea (HYDREA) 500 MG capsule TAKE TWO CAPSULES BY MOUTH DAILY 180 capsule 5  . insulin degludec (TRESIBA FLEXTOUCH) 100 UNIT/ML SOPN FlexTouch Pen Inject 20 Units into the skin daily.    . methotrexate 2.5 MG tablet Take by mouth.    [name] omeprazole (PRILOSEC) 20 MG capsule Take 20 mg by mouth 2 (two) times daily before a meal.     . oxybutynin (DITROPAN-XL) 10 MG 24 hr tablet Take 10 mg by mouth daily.    . rosuvastatin (CRESTOR) 5 MG tablet Take 5 mg by mouth daily.    [name] Spacer/Aero-Holding Chambers (AEROCHAMBER MV) inhaler Use as instructed 1 each 0  . spironolactone (ALDACTONE) 25 MG tablet Take 0.5 tablets (12.5 mg total) by mouth daily. 45 tablet 3  . sulfaSALAzine (AZULFIDINE) 500 MG tablet Take by mouth.    . tamsulosin (FLOMAX) 0.4 MG CAPS capsule Take 0.4 mg by mouth.    . furosemide (LASIX) 40 MG tablet  Take 1 tablet (40 mg total) by mouth daily. 90 tablet 3   No current facility-administered medications for this visit.   Facility-Administered Medications Ordered in Other Visits  Medication Dose Route Frequency Provider Last Rate Last Admin  . 0.9 %  sodium chloride infusion   Intravenous Continuous Volanda Napoleon, MD   Stopped at 05/16/13 1115    Allergies:   Bupropion, Fluoxetine, Ibuprofen, Prednisone, Temazepam, Trazodone and nefazodone, Cephalexin, Fluoxetine hcl, and Prozac [fluoxetine hcl]   Social History: Social History   Socioeconomic History  . Marital status: Married    Spouse name: Not on file  . Number of children: Not on file  . Years of education: Not on file  . Highest education level: Not on file  Occupational History  . Not on file    Tobacco Use  . Smoking status: Former Smoker    Packs/day: 1.00    Years: 44.00    Pack years: 44.00    Types: Cigarettes    Start date: 06/05/1968    Quit date: 04/05/2013    Years since quitting: 6.7  . Smokeless tobacco: Never Used  . Tobacco comment: quit 2 years ago  Vaping Use  . Vaping Use: Never used  Substance and Sexual Activity  . Alcohol use: Yes    Alcohol/week: 0.0 standard drinks    Comment: rare  . Drug use: No  . Sexual activity: Not Currently  Other Topics Concern  . Not on file  Social History Narrative   Lives at home with wife in a one story home.  Has no children.  Does not work.  Getting workman's comp.  Education: 4 years trade school.    Social Determinants of Health   Financial Resource Strain:   . Difficulty of Paying Living Expenses: Not on file  Food Insecurity:   . Worried About Charity fundraiser in the Last Year: Not on file  . Ran Out of Food in the Last Year: Not on file  Transportation Needs:   . Lack of Transportation (Medical): Not on file  . Lack of Transportation (Non-Medical): Not on file  Physical Activity:   . Days of Exercise per Week: Not on file  . Minutes of Exercise per Session: Not on file  Stress:   . Feeling of Stress : Not on file  Social Connections:   . Frequency of Communication with Friends and Family: Not on file  . Frequency of Social Gatherings with Friends and Family: Not on file  . Attends Religious Services: Not on file  . Active Member of Clubs or Organizations: Not on file  . Attends Archivist Meetings: Not on file  . Marital Status: Not on file  Intimate Partner Violence:   . Fear of Current or Ex-Partner: Not on file  . Emotionally Abused: Not on file  . Physically Abused: Not on file  . Sexually Abused: Not on file    Family History: Family History  Problem Relation Age of Onset  . Heart disease Maternal Grandmother        Pacemaker, MI  . Stroke Mother   . Other Father         Deceased, car fell on him  . Diabetes Sister   . Hypertension Sister      Review of Systems: All other systems reviewed and are otherwise negative except as noted above.   Physical Exam: VS:  BP 128/70   Pulse 83   Ht 5' 10"  (1.778 m)  Wt (!) 305 lb 3.2 oz (138.4 kg)   SpO2 92%   BMI 43.79 kg/m  , BMI Body mass index is 43.79 kg/m.  GEN- The patient is elderly and obese appearing, alert and oriented x 3 today.   HEENT: normocephalic, atraumatic; sclera clear, conjunctiva pink; hearing intact; oropharynx clear; neck supple  Lungs- Clear to ausculation bilaterally, normal work of breathing.  No wheezes, rales, rhonchi Heart- Regular rate and rhythm (paced) GI- soft, non-tender, non-distended, bowel sounds present  Extremities- no clubbing, cyanosis, 1+ LE edema  MS- no significant deformity or atrophy Skin- warm and dry, no rash or lesion; PPM pocket well healed Psych- euthymic mood, full affect Neuro- strength and sensation are intact  PPM Interrogation- reviewed in detail today,  See PACEART report  EKG:  EKG is not ordered today.  Recent Labs: 09/29/2019: ALT 13; BUN 15; Creatinine 1.06; Hemoglobin 13.3; Platelet Count 303; Potassium 3.6; Sodium 142   Wt Readings from Last 3 Encounters:  01/02/20 (!) 305 lb 3.2 oz (138.4 kg)  09/29/19 (!) 301 lb (136.5 kg)  09/22/19 300 lb (136.1 kg)     Other studies Reviewed: Additional studies/ records that were reviewed today include: Andy's notes  Assessment and Plan:  1.  NICM/ Chronic systolic heart failure Normal PPM function See Pace Art report No changes today Volume overloaded on exam today Increase Lasix to 72m bid x3 days then 482mdaily BMET, BNP today Update echo  ER precautions reviewed.   2.  HTN Stable No change required today  3.  Morbid obesity Body mass index is 43.79 kg/m. Weight loss encouraged    Current medicines are reviewed at length with the patient today.   The patient does not have  concerns regarding his medicines.  The following changes were made today:  As above   Labs/ tests ordered today include:  Orders Placed This Encounter  Procedures  . Basic Metabolic Panel (BMET)  . Pro b natriuretic peptide  . [place]. ECHOCARDIOGRAM COMPLETE     Disposition:   Follow up with phone call on Monday to assess improvement.  Me or Dr KlCaryl Comesn 6 weeks.   Signed, AmChanetta MarshallNP 01/02/2020 8:39 AM  [phone]) [phone])

## 2020-01-02 ENCOUNTER — Ambulatory Visit: Payer: Medicare Other | Admitting: Nurse Practitioner

## 2020-01-02 ENCOUNTER — Encounter: Payer: Self-pay | Admitting: Nurse Practitioner

## 2020-01-02 ENCOUNTER — Other Ambulatory Visit: Payer: Self-pay

## 2020-01-02 VITALS — BP 128/70 | HR 83 | Ht 70.0 in | Wt 305.2 lb

## 2020-01-02 DIAGNOSIS — I509 Heart failure, unspecified: Secondary | ICD-10-CM

## 2020-01-02 DIAGNOSIS — I428 Other cardiomyopathies: Secondary | ICD-10-CM

## 2020-01-02 DIAGNOSIS — R0602 Shortness of breath: Secondary | ICD-10-CM

## 2020-01-02 DIAGNOSIS — E669 Obesity, unspecified: Secondary | ICD-10-CM | POA: Diagnosis not present

## 2020-01-02 LAB — CUP PACEART INCLINIC DEVICE CHECK
Battery Remaining Longevity: 58 mo
Battery Voltage: 2.92 V
Brady Statistic RA Percent Paced: 5.9 %
Brady Statistic RV Percent Paced: 98 %
Date Time Interrogation Session: 20210827083534
Implantable Lead Implant Date: 20151223
Implantable Lead Implant Date: 20151223
Implantable Lead Implant Date: 20151223
Implantable Lead Location: 753858
Implantable Lead Location: 753859
Implantable Lead Location: 753860
Implantable Pulse Generator Implant Date: 20151223
Lead Channel Impedance Value: 400 Ohm
Lead Channel Impedance Value: 425 Ohm
Lead Channel Impedance Value: 612.5 Ohm
Lead Channel Pacing Threshold Amplitude: 1.125 V
Lead Channel Pacing Threshold Amplitude: 1.5 V
Lead Channel Pacing Threshold Amplitude: 1.5 V
Lead Channel Pacing Threshold Amplitude: 1.5 V
Lead Channel Pacing Threshold Pulse Width: 0.4 ms
Lead Channel Pacing Threshold Pulse Width: 0.4 ms
Lead Channel Pacing Threshold Pulse Width: 0.4 ms
Lead Channel Pacing Threshold Pulse Width: 0.7 ms
Lead Channel Sensing Intrinsic Amplitude: 2.1 mV
Lead Channel Sensing Intrinsic Amplitude: 9.6 mV
Lead Channel Setting Pacing Amplitude: 2.125
Lead Channel Setting Pacing Amplitude: 2.5 V
Lead Channel Setting Pacing Amplitude: 2.5 V
Lead Channel Setting Pacing Pulse Width: 0.4 ms
Lead Channel Setting Pacing Pulse Width: 0.7 ms
Lead Channel Setting Sensing Sensitivity: 2 mV
Pulse Gen Model: 3242
Pulse Gen Serial Number: 7701275

## 2020-01-02 MED ORDER — FUROSEMIDE 40 MG PO TABS: 40.0000 mg | ORAL_TABLET | Freq: Every day | ORAL | 3 refills | Status: DC

## 2020-01-02 NOTE — Patient Instructions (Addendum)
Medication Instructions:  Your physician has recommended you make the following change in your medication:  -- INCREASE FUROSEMIDE (Lasix) to 40 mg - Take 2 tablets (40 mg) by mouth twice daily for the next 3 days, Then START FUROSEMIDE 40 mg - Take 1 tablet (40 mg) by mouth daily -- new RX sent to pharmacy *If you need a refill on your cardiac medications before your next appointment, please call your pharmacy*  Lab Work: Your physician has recommended that you have lab work today: BMET and Pro BNP  If you have labs (blood work) drawn today and your tests are completely normal, you will receive your results only by: [name] MyChart Message (if you have MyChart) OR . A paper copy in the mail If you have any lab test that is abnormal or we need to change your treatment, we will call you to review the results.  Testing/Procedures: Your physician has requested that you have an echocardiogram. Echocardiography is a painless test that uses sound waves to create images of your heart. It provides your doctor with information about the size and shape of your heart and how well your heart's chambers and valves are working. This procedure takes approximately one hour. There are no restrictions for this procedure.  Follow-Up: At [facility], you and your health needs are our priority.  As part of our continuing mission to provide you with exceptional heart care, we have created designated Provider Care Teams.  These Care Teams include your primary Cardiologist (physician) and Advanced Practice Providers (APPs -  Physician Assistants and Nurse Practitioners) who all work together to provide you with the care you need, when you need it.  We recommend signing up for the patient portal called "MyChart".  Sign up information is provided on this After Visit Summary.  MyChart is used to connect with patients for Virtual Visits (Telemedicine).  Patients are able to view lab/test results, encounter notes, upcoming  appointments, etc.  Non-urgent messages can be sent to your provider as well.   To learn more about what you can do with MyChart, go to [email or web address].    Your next appointment:   Your physician recommends that you schedule a follow-up appointment in: 6-8 weeks with Dr. Caryl Comes -- 02/09/20 at 9:00 am   The format for your next appointment:   In Person with Dr. Caryl Comes  -- Sharman Cheek, RN will be contacting you to get you enrolled in the ICM ([facility]) to help further assist in monitoring your fluid levels from your device.

## 2020-01-03 LAB — BASIC METABOLIC PANEL
BUN/Creatinine Ratio: 16 (ref 10–24)
BUN: 15 mg/dL (ref 8–27)
CO2: 20 mmol/L (ref 20–29)
Calcium: 8.1 mg/dL — ABNORMAL LOW (ref 8.6–10.2)
Chloride: 102 mmol/L (ref 96–106)
Creatinine, Ser: 0.93 mg/dL (ref 0.76–1.27)
GFR calc Af Amer: 97 mL/min/{1.73_m2} (ref >59)
GFR calc non Af Amer: 84 mL/min/{1.73_m2} (ref >59)
Glucose: 185 mg/dL — ABNORMAL HIGH (ref 65–99)
Potassium: 4.1 mmol/L (ref 3.5–5.2)
Sodium: 139 mmol/L (ref 134–144)

## 2020-01-03 LAB — PRO B NATRIURETIC PEPTIDE: NT-Pro BNP: 2894 pg/mL — ABNORMAL HIGH (ref 0–376)

## 2020-01-05 ENCOUNTER — Telehealth: Payer: Self-pay

## 2020-01-05 NOTE — Telephone Encounter (Signed)
-----   Message from Patsey Berthold, NP sent at 01/03/2020  6:12 AM EDT ----- Please notify patient of lab results. Potassium and kidney function stable.  BNP reflects significant volume overload. Please call Monday and check on status. He will need repeat BMET on Monday or Tuesday as well.

## 2020-01-05 NOTE — Telephone Encounter (Signed)
Lmtcb for lab results and to get a status check on fluid levels and breathing since up titration of lasix over the weekend.

## 2020-01-06 ENCOUNTER — Other Ambulatory Visit: Payer: Self-pay

## 2020-01-06 ENCOUNTER — Other Ambulatory Visit: Payer: Medicare Other | Admitting: *Deleted

## 2020-01-06 ENCOUNTER — Telehealth: Payer: Self-pay

## 2020-01-06 DIAGNOSIS — I428 Other cardiomyopathies: Secondary | ICD-10-CM

## 2020-01-06 DIAGNOSIS — R0602 Shortness of breath: Secondary | ICD-10-CM

## 2020-01-06 LAB — BASIC METABOLIC PANEL
BUN/Creatinine Ratio: 19 (ref 10–24)
BUN: 23 mg/dL (ref 8–27)
CO2: 21 mmol/L (ref 20–29)
Calcium: 8.6 mg/dL (ref 8.6–10.2)
Chloride: 104 mmol/L (ref 96–106)
Creatinine, Ser: 1.24 mg/dL (ref 0.76–1.27)
GFR calc Af Amer: 69 mL/min/{1.73_m2} (ref >59)
GFR calc non Af Amer: 59 mL/min/{1.73_m2} — ABNORMAL LOW (ref >59)
Glucose: 186 mg/dL — ABNORMAL HIGH (ref 65–99)
Potassium: 4.1 mmol/L (ref 3.5–5.2)
Sodium: 142 mmol/L (ref 134–144)

## 2020-01-06 NOTE — Telephone Encounter (Signed)
Pt is aware and agreeable to results and recommendations.  He is feeling much better then he presented in the office. Pt states he has lost about 12 pounds worth of fluid and his breathing and quality of sleep have markedly improved. Pt is agreeable to repeat BMET 8/31. He is agreeable to continuing lasix at 40 mg QD

## 2020-01-06 NOTE — Telephone Encounter (Signed)
-----   Message from Patsey Berthold, NP sent at 01/03/2020  6:12 AM EDT ----- Please notify patient of lab results. Potassium and kidney function stable.  BNP reflects significant volume overload. Please call Monday and check on status. He will need repeat BMET on Monday or Tuesday as well.

## 2020-01-08 ENCOUNTER — Telehealth: Payer: Self-pay

## 2020-01-08 ENCOUNTER — Telehealth: Payer: Self-pay | Admitting: *Deleted

## 2020-01-08 DIAGNOSIS — I509 Heart failure, unspecified: Secondary | ICD-10-CM

## 2020-01-08 DIAGNOSIS — R0602 Shortness of breath: Secondary | ICD-10-CM

## 2020-01-08 NOTE — Telephone Encounter (Signed)
-----   Message from Patsey Berthold, NP sent at 01/08/2020  7:43 AM EDT ----- Please notify patient of stable labs.  Renal function stable with increased dose of lasix.  Please ask to track daily weights. Repeat BMET 2 weeks, follow up with Dr Caryl Comes as scheduled. Thanks!

## 2020-01-08 NOTE — Telephone Encounter (Signed)
Pt is aware and agreeable to lab results. He is agreeable to daily weights. Pt will have repeat 9/22 and follow up as scheduled. Pt states he has been experiencing a sharp "jolting pain" under his left breast since he last left the office. He states it feels like an electric shock. I advised him to reach out to the device clinic to ensure that it is not a device malfunction. He is agreeable

## 2020-01-08 NOTE — Telephone Encounter (Signed)
-----   Message from Bobby Rumpf, [place] sent at 01/08/2020  9:50 AM EDT ----- Pt is aware and agreeable to lab results. He is agreeable to daily weights. Pt will have repeat 9/22 and follow up as scheduled. Pt states he has been experiencing a sharp "jolting pain" under his left breast since he last left the office. He states it feels like an electric shock. I advised him to reach out to the device clinic to ensure that it is not a device malfunction. He is agreeable

## 2020-01-08 NOTE — Telephone Encounter (Signed)
Spoke with pt. Noticed sharp pain on left side under left breast beginning a couple of days ago. Pain lasts 2-3 sec, occurs 3-4 times/day. Notices it when he is sitting in his chair or laying in bed. Denies "hiccupping" sensation. Denies associated symptoms, including SOB. Reports he has also been hearing a buzzing noise in his left ear that he does not think is related (St. Jude CRT-P has a beep alert tone). Reports compliance with cardiac medications. Normal CRT-P check at appointment 01/02/20 with Genene Churn, NP. BiVP 98%. Next f/u with Dr. Caryl Comes is on 02/09/20.  Advised pt that I will forward message to Dr. Caryl Comes and Rosann Auerbach, RN, for advisement. ED precautions reviewed for worsening symptoms. Pt verbalize understanding and agreement with plan.

## 2020-01-12 NOTE — Telephone Encounter (Signed)
Noted Thanks SK

## 2020-01-13 ENCOUNTER — Telehealth: Payer: Self-pay

## 2020-01-13 NOTE — Telephone Encounter (Signed)
ICM referral call to patient and explained ICM program.  He agreed to monthly follow up.  He has been taking Lasix as advised by Chanetta Marshall, NP at the last office visit.  He can tell today he has a little fluid building back up again due to his feels like it is a little harder to breath today.  Advised the fluid had resolved from 8/28 - 9/6.   Discussed liming salt intake and avoiding restaurant food since they are typically high in salt.  Provided ICM direct number and encouraged to call if he develops more fluid symptoms.  Advised will recheck fluid levels 01/21/2020 and call him with results.  Patient agreeable to call back.  01/13/2020 Corvue Thoracic impedance:

## 2020-01-13 NOTE — Telephone Encounter (Signed)
Bobby Rumpf, CMA  Tahir Blank Panda, RN Hey,   This patient was seen by Chanetta Marshall, NP today and was pretty fluid overloaded and having SOB. Amber would like for him to be followed by your. He has been struggling with DOE all week and his wife is a little on the nervous and anxious side.   We up titrated his lasix form 20 mg QD to 40 mg BID x 3 days then 40 mg QD. I am going to call them on Monday and do a status update. She is worried that if something happens over the weekend they will not have a point of contact. I told her if she has any concerns over the weekend that she feels are emergent she needs to call EMS otherwise she can call and leave a message for "Triage" and it will be handled accordingly.   I just wanted to give you the heads up!   Thanks,  Caryn Section, CMA (AAMA)

## 2020-01-14 ENCOUNTER — Other Ambulatory Visit: Payer: Self-pay

## 2020-01-14 ENCOUNTER — Ambulatory Visit (HOSPITAL_COMMUNITY): Payer: Medicare Other | Attending: Cardiology

## 2020-01-14 DIAGNOSIS — I509 Heart failure, unspecified: Secondary | ICD-10-CM | POA: Diagnosis present

## 2020-01-14 LAB — ECHOCARDIOGRAM COMPLETE
Area-P 1/2: 4.49 cm2
S' Lateral: 5.1 cm

## 2020-01-14 MED ORDER — PERFLUTREN LIPID MICROSPHERE
1.0000 mL | INTRAVENOUS | Status: AC | PRN
  Administered 2020-01-14: 3 mL via INTRAVENOUS

## 2020-01-14 NOTE — Telephone Encounter (Signed)
Spoke with pt to advise no new recommendations from Dr. Caryl Comes. Pt reports he is no longer experiencing intermittent sharp pains. Pt aware to call back with any questions or new concerns. No further questions at this time.

## 2020-01-21 ENCOUNTER — Ambulatory Visit (INDEPENDENT_AMBULATORY_CARE_PROVIDER_SITE_OTHER): Payer: Medicare Other

## 2020-01-21 DIAGNOSIS — I5022 Chronic systolic (congestive) heart failure: Secondary | ICD-10-CM | POA: Diagnosis not present

## 2020-01-21 DIAGNOSIS — Z950 Presence of cardiac pacemaker: Secondary | ICD-10-CM | POA: Diagnosis not present

## 2020-01-21 NOTE — Progress Notes (Signed)
EPIC Encounter for ICM Monitoring  Patient Name: William Villanueva is a 69 y.o. male Date: 01/21/2020 Primary Care Physican: Antony Contras, MD Primary Cardiologist: Stanford Breed Electrophysiologist: Vergie Living Pacing: 98%  01/02/2020 Office Weight: 305 lbs        1st ICM remote transmission.  Heart Failure questions reviewed.  Pt asymptomatic.   CorVue thoracic impedance normal but was suggesting possible fluid accumulation from 12/23/19 - 01/02/20.   Pt started on Furosemide prescription 01/02/2020.  Prescribed:    Furosemide 40 mg take 1 tablet daily  Spironolactone 25 mg take 0.5 tablet (12.5 mg total) once a day  Recommendations:   Encouraged to call if experiencing fluid symptoms.  Follow-up plan: ICM clinic phone appointment on 02/23/2020.   91 day device clinic remote transmission 03/18/2020.    EP/Cardiology Office Visits: 02/09/2020 with Dr. Caryl Comes. 02/11/2020 with Dr Stanford Breed.   Copy of ICM check sent to Dr. Caryl Comes.   3 month ICM trend: 01/21/2020    1 Year ICM trend:       Rosalene Billings, RN 01/21/2020 3:19 PM

## 2020-01-26 ENCOUNTER — Telehealth: Payer: Self-pay

## 2020-01-26 NOTE — Telephone Encounter (Signed)
Pt is aware and agreeable to echo results with decreased EF from previous study in Jan 2020. Pt is agreeable to keeping follow up in October with Dr. Caryl Comes.

## 2020-01-26 NOTE — Telephone Encounter (Signed)
-----   Message from Patsey Berthold, NP sent at 01/22/2020  7:46 AM EDT ----- Please notify patient of echo results.  EF slightly down from previous. Keep follow up as scheduled with Dr Caryl Comes. Thanks!

## 2020-01-28 ENCOUNTER — Other Ambulatory Visit: Payer: Medicare Other

## 2020-01-28 ENCOUNTER — Other Ambulatory Visit: Payer: Self-pay

## 2020-01-28 DIAGNOSIS — R0602 Shortness of breath: Secondary | ICD-10-CM

## 2020-01-28 DIAGNOSIS — I509 Heart failure, unspecified: Secondary | ICD-10-CM

## 2020-01-28 LAB — BASIC METABOLIC PANEL
BUN/Creatinine Ratio: 23 (ref 10–24)
BUN: 29 mg/dL — ABNORMAL HIGH (ref 8–27)
CO2: 19 mmol/L — ABNORMAL LOW (ref 20–29)
Calcium: 8.8 mg/dL (ref 8.6–10.2)
Chloride: 104 mmol/L (ref 96–106)
Creatinine, Ser: 1.28 mg/dL — ABNORMAL HIGH (ref 0.76–1.27)
GFR calc Af Amer: 66 mL/min/{1.73_m2} (ref >59)
GFR calc non Af Amer: 57 mL/min/{1.73_m2} — ABNORMAL LOW (ref >59)
Glucose: 186 mg/dL — ABNORMAL HIGH (ref 65–99)
Potassium: 4.4 mmol/L (ref 3.5–5.2)
Sodium: 140 mmol/L (ref 134–144)

## 2020-01-28 NOTE — Addendum Note (Signed)
Addended by: Campbell Riches on: 01/28/2020 10:48 AM   Modules accepted: Orders

## 2020-01-30 ENCOUNTER — Encounter: Payer: Self-pay | Admitting: Hematology & Oncology

## 2020-01-30 ENCOUNTER — Inpatient Hospital Stay: Payer: Medicare Other

## 2020-01-30 ENCOUNTER — Other Ambulatory Visit: Payer: Self-pay

## 2020-01-30 ENCOUNTER — Inpatient Hospital Stay: Payer: Medicare Other | Attending: Hematology & Oncology | Admitting: Hematology & Oncology

## 2020-01-30 VITALS — BP 134/77 | HR 93 | Temp 98.0°F | Resp 20 | Wt 286.0 lb

## 2020-01-30 DIAGNOSIS — D45 Polycythemia vera: Secondary | ICD-10-CM | POA: Insufficient documentation

## 2020-01-30 DIAGNOSIS — Z7982 Long term (current) use of aspirin: Secondary | ICD-10-CM | POA: Insufficient documentation

## 2020-01-30 DIAGNOSIS — R197 Diarrhea, unspecified: Secondary | ICD-10-CM | POA: Insufficient documentation

## 2020-01-30 DIAGNOSIS — I509 Heart failure, unspecified: Secondary | ICD-10-CM | POA: Diagnosis not present

## 2020-01-30 DIAGNOSIS — I441 Atrioventricular block, second degree: Secondary | ICD-10-CM | POA: Diagnosis not present

## 2020-01-30 LAB — CBC WITH DIFFERENTIAL (CANCER CENTER ONLY)
Abs Immature Granulocytes: 0.06 10*3/uL (ref 0.00–0.07)
Basophils Absolute: 0.1 10*3/uL (ref 0.0–0.1)
Basophils Relative: 2 %
Eosinophils Absolute: 0.1 10*3/uL (ref 0.0–0.5)
Eosinophils Relative: 1 %
HCT: 44.3 % (ref 39.0–52.0)
Hemoglobin: 14.4 g/dL (ref 13.0–17.0)
Immature Granulocytes: 1 %
Lymphocytes Relative: 9 %
Lymphs Abs: 0.6 10*3/uL — ABNORMAL LOW (ref 0.7–4.0)
MCH: 34.5 pg — ABNORMAL HIGH (ref 26.0–34.0)
MCHC: 32.5 g/dL (ref 30.0–36.0)
MCV: 106.2 fL — ABNORMAL HIGH (ref 80.0–100.0)
Monocytes Absolute: 0.2 10*3/uL (ref 0.1–1.0)
Monocytes Relative: 3 %
Neutro Abs: 5.6 10*3/uL (ref 1.7–7.7)
Neutrophils Relative %: 84 %
Platelet Count: 318 10*3/uL (ref 150–400)
RBC: 4.17 MIL/uL — ABNORMAL LOW (ref 4.22–5.81)
RDW: 17.5 % — ABNORMAL HIGH (ref 11.5–15.5)
WBC Count: 6.6 10*3/uL (ref 4.0–10.5)
nRBC: 0 % (ref 0.0–0.2)

## 2020-01-30 LAB — CMP (CANCER CENTER ONLY)
ALT: 14 U/L (ref 0–44)
AST: 15 U/L (ref 15–41)
Albumin: 4.3 g/dL (ref 3.5–5.0)
Alkaline Phosphatase: 65 U/L (ref 38–126)
Anion gap: 10 (ref 5–15)
BUN: 31 mg/dL — ABNORMAL HIGH (ref 8–23)
CO2: 21 mmol/L — ABNORMAL LOW (ref 22–32)
Calcium: 9.4 mg/dL (ref 8.9–10.3)
Chloride: 107 mmol/L (ref 98–111)
Creatinine: 1.41 mg/dL — ABNORMAL HIGH (ref 0.61–1.24)
GFR, Est AFR Am: 59 mL/min — ABNORMAL LOW (ref >60)
GFR, Estimated: 51 mL/min — ABNORMAL LOW (ref >60)
Glucose, Bld: 148 mg/dL — ABNORMAL HIGH (ref 70–99)
Potassium: 4.1 mmol/L (ref 3.5–5.1)
Sodium: 138 mmol/L (ref 135–145)
Total Bilirubin: 1 mg/dL (ref 0.3–1.2)
Total Protein: 7.1 g/dL (ref 6.5–8.1)

## 2020-01-30 LAB — IRON AND TIBC
Iron: 150 ug/dL (ref 42–163)
Saturation Ratios: 40 % (ref 20–55)
TIBC: 378 ug/dL (ref 202–409)
UIBC: 228 ug/dL (ref 117–376)

## 2020-01-30 LAB — LACTATE DEHYDROGENASE: LDH: 341 U/L — ABNORMAL HIGH (ref 98–192)

## 2020-01-30 LAB — FERRITIN: Ferritin: 61 ng/mL (ref 24–336)

## 2020-01-30 NOTE — Progress Notes (Signed)
Hematology and Oncology Follow Up Visit  Zyeir Dymek [id number] 12/04/1950 69 y.o. 01/30/2020   Principle Diagnosis:   Polycythemia vera-JAK2 positive  Heart block-Mobitz II  Current Therapy:    Hydrea 1000 mg p.o.daily -dose changed on 06/25/2018  Aspirin 81 mg by mouth daily  Phlebotomy to maintain hematocrit below 45%     Interim History:  Mr.  Hessel is back for followup.  He is not feeling too good right now.  He had congestive heart failure a few weeks ago.  He was not admitted.  He had his Lasix dose increased.  He now is having diarrhea.  He said diarrhea for 2 weeks.  He has not been able to get his medicine for the diarrhea.  Sounds like he is on sulfasalazine.  Hopefully he will be to get this today.  He said that his ventricular ejection fraction was 30-35%.  A little surprised by this since he is on carvedilol and the Entresto.  Otherwise, he is doing fairly well.  His back is about the same.  His pacemaker has not had any issues.  He and his wife did go up to [place] for part of the summer.  They had a good time.  He has had no problems with cough.  He has had no problems with the coronavirus.  Overall, his performance status is ECOG 1.   Medications:  Current Outpatient Medications:  .  allopurinol (ZYLOPRIM) 300 MG tablet, Take 300 mg by mouth daily., Disp: , Rfl:  .  aspirin 81 MG tablet, Take 81 mg by mouth daily., Disp: , Rfl:  .  BD PEN NEEDLE NANO U/F 32G X 4 MM MISC, , Disp: , Rfl:  .  carvedilol (COREG) 12.5 MG tablet, Take 1 tablet (12.5 mg total) by mouth 2 (two) times daily with a meal., Disp: 180 tablet, Rfl: 3 .  citalopram (CELEXA) 10 MG tablet, Take 5 mg by mouth daily. , Disp: , Rfl:  .  ENTRESTO 24-26 MG, TAKE ONE TABLET BY MOUTH TWICE A DAY, Disp: 60 tablet, Rfl: 4 .  febuxostat (ULORIC) 40 MG tablet, Take 80 mg by mouth daily., Disp: , Rfl:  .  folic acid (FOLVITE) 1 MG tablet, Take 1 mg by mouth daily., Disp: , Rfl:  .  furosemide  (LASIX) 40 MG tablet, Take 1 tablet (40 mg total) by mouth daily., Disp: 90 tablet, Rfl: 3 .  hydroxyurea (HYDREA) 500 MG capsule, TAKE TWO CAPSULES BY MOUTH DAILY, Disp: 180 capsule, Rfl: 5 .  insulin degludec (TRESIBA FLEXTOUCH) 100 UNIT/ML SOPN FlexTouch Pen, Inject 20 Units into the skin daily., Disp: , Rfl:  .  methotrexate 2.5 MG tablet, Take by mouth., Disp: , Rfl:  .  omeprazole (PRILOSEC) 20 MG capsule, Take 20 mg by mouth 2 (two) times daily before a meal. , Disp: , Rfl:  .  oxybutynin (DITROPAN-XL) 10 MG 24 hr tablet, Take 10 mg by mouth daily., Disp: , Rfl:  .  rosuvastatin (CRESTOR) 5 MG tablet, Take 5 mg by mouth daily., Disp: , Rfl:  .  Spacer/Aero-Holding Chambers (AEROCHAMBER MV) inhaler, Use as instructed, Disp: 1 each, Rfl: 0 .  spironolactone (ALDACTONE) 25 MG tablet, Take 0.5 tablets (12.5 mg total) by mouth daily., Disp: 45 tablet, Rfl: 3 .  sulfaSALAzine (AZULFIDINE) 500 MG tablet, Take by mouth., Disp: , Rfl:  .  tamsulosin (FLOMAX) 0.4 MG CAPS capsule, Take 0.4 mg by mouth., Disp: , Rfl:  No current facility-administered medications for this visit.  Facility-Administered Medications Ordered in Other Visits:  .  0.9 %  sodium chloride infusion, , Intravenous, Continuous, Manu Rubey, Rudell Cobb, MD, Stopped at 05/16/13 1115  Allergies:  Allergies  Allergen Reactions  . Bupropion Hives  . Fluoxetine Itching  . Ibuprofen Itching  . Prednisone Itching and Other (See Comments)    Abdominal pain Other reaction(s): Abdominal Pain   . Temazepam Other (See Comments)    Dizziness   . Trazodone And Nefazodone Other (See Comments)    Dizziness  . Cephalexin Diarrhea    Caused C-diff Caused C-diff   . Fluoxetine Hcl Itching  . Prozac [Fluoxetine Hcl] Itching    Past Medical History, Surgical history, Social history, and Family History were reviewed and updated.  Review of Systems: Review of Systems  Constitutional: Negative.   HENT: Negative.   Eyes: Negative.    Respiratory: Positive for shortness of breath.   Cardiovascular: Positive for chest pain.  Gastrointestinal: Negative.   Genitourinary: Negative.   Musculoskeletal: Positive for back pain.  Skin: Negative.   Neurological: Negative.   Endo/Heme/Allergies: Negative.   Psychiatric/Behavioral: Negative.     Physical Exam:  vitals were not taken for this visit.   Physical Exam Vitals reviewed.  HENT:     Head: Normocephalic and atraumatic.  Eyes:     Pupils: Pupils are equal, round, and reactive to light.  Cardiovascular:     Rate and Rhythm: Normal rate and regular rhythm.     Heart sounds: Normal heart sounds.  Pulmonary:     Effort: Pulmonary effort is normal.     Breath sounds: Normal breath sounds.  Abdominal:     General: Bowel sounds are normal.     Palpations: Abdomen is soft.  Musculoskeletal:        General: No tenderness or deformity. Normal range of motion.     Cervical back: Normal range of motion.  Lymphadenopathy:     Cervical: No cervical adenopathy.  Skin:    General: Skin is warm and dry.     Findings: No erythema or rash.  Neurological:     Mental Status: He is alert and oriented to person, place, and time.  Psychiatric:        Behavior: Behavior normal.        Thought Content: Thought content normal.        Judgment: Judgment normal.      Lab Results  Component Value Date   WBC 6.6 01/30/2020   HGB 14.4 01/30/2020   HCT 44.3 01/30/2020   MCV 106.2 (H) 01/30/2020   PLT 318 01/30/2020     Chemistry      Component Value Date/Time   NA 140 01/28/2020 1053   NA 141 04/10/2017 0923   NA 140 04/05/2016 0739   K 4.4 01/28/2020 1053   K 3.5 04/10/2017 0923   K 4.2 04/05/2016 0739   CL 104 01/28/2020 1053   CL 108 04/10/2017 0923   CO2 19 (L) 01/28/2020 1053   CO2 24 04/10/2017 0923   CO2 20 (L) 04/05/2016 0739   BUN 29 (H) 01/28/2020 1053   BUN 12 04/10/2017 0923   BUN 14.7 04/05/2016 0739   CREATININE 1.28 (H) 01/28/2020 1053    CREATININE 1.06 09/29/2019 0743   CREATININE 1.1 04/10/2017 0923   CREATININE 1.1 04/05/2016 0739      Component Value Date/Time   CALCIUM 8.8 01/28/2020 1053   CALCIUM 8.4 04/10/2017 0923   CALCIUM 9.0 04/05/2016 0739   ALKPHOS 53 09/29/2019  0743   ALKPHOS 77 04/10/2017 0923   ALKPHOS 91 04/05/2016 0739   AST 16 09/29/2019 0743   AST 34 04/05/2016 0739   ALT 13 09/29/2019 0743   ALT 32 04/10/2017 0923   ALT 40 04/05/2016 0739   BILITOT 0.5 09/29/2019 0743   BILITOT 0.48 04/05/2016 0739      Impression and Plan: Mr. Humble is 69 year old gentleman with polycythemia vera.  He is on Hydrea.  He is doing well with the Hydrea.  We will not phlebotomize him today.  I do think there is too much going on with him.  I do not want to cause any further problems as he can have some issues with phlebotomy.  I will get him back right before Thanksgiving.  I want to get him back before the holidays and see how things look.   Volanda Napoleon, MD 9/24/20218:17 AM

## 2020-02-03 ENCOUNTER — Telehealth: Payer: Self-pay | Admitting: Internal Medicine

## 2020-02-03 NOTE — Telephone Encounter (Signed)
Patient's wife states the patient is throwing up, has diarrhea, and is very weak. She states he is coughing bad again. She would like to know if he can be seen sooner than his appointment 02/09/2020. Please advise.

## 2020-02-03 NOTE — Telephone Encounter (Signed)
Spoke with pt who complains of intermittent vomiting, diarrhea, cough and weakness x 1 week.  Pt denies fever, SOB or edema.  Reports some mild CP on and off but not currently.  Pt has appointment with Dr Caryl Comes 02/09/2020.  Encouraged pt to contact his PCP re symptoms.  Pt does not believe he has been exposed to anyone with Covid. Encouraged hydration and take medications as prescribed..  Reviewed ED precautions.  Pt verbalized understanding and agrees with current plan.

## 2020-02-04 ENCOUNTER — Emergency Department (HOSPITAL_BASED_OUTPATIENT_CLINIC_OR_DEPARTMENT_OTHER): Payer: Medicare Other

## 2020-02-04 ENCOUNTER — Other Ambulatory Visit: Payer: Self-pay

## 2020-02-04 ENCOUNTER — Emergency Department (HOSPITAL_BASED_OUTPATIENT_CLINIC_OR_DEPARTMENT_OTHER)
Admission: EM | Admit: 2020-02-04 | Discharge: 2020-02-04 | Disposition: A | Discharge status: Home / Self Care | Payer: Medicare Other | Attending: Emergency Medicine | Admitting: Emergency Medicine

## 2020-02-04 ENCOUNTER — Encounter (HOSPITAL_BASED_OUTPATIENT_CLINIC_OR_DEPARTMENT_OTHER): Payer: Self-pay | Admitting: *Deleted

## 2020-02-04 DIAGNOSIS — Z87891 Personal history of nicotine dependence: Secondary | ICD-10-CM | POA: Insufficient documentation

## 2020-02-04 DIAGNOSIS — A09 Infectious gastroenteritis and colitis, unspecified: Secondary | ICD-10-CM | POA: Diagnosis not present

## 2020-02-04 DIAGNOSIS — I110 Hypertensive heart disease with heart failure: Secondary | ICD-10-CM | POA: Insufficient documentation

## 2020-02-04 DIAGNOSIS — I5042 Chronic combined systolic (congestive) and diastolic (congestive) heart failure: Secondary | ICD-10-CM | POA: Diagnosis not present

## 2020-02-04 DIAGNOSIS — Z950 Presence of cardiac pacemaker: Secondary | ICD-10-CM | POA: Diagnosis not present

## 2020-02-04 DIAGNOSIS — Z20822 Contact with and (suspected) exposure to covid-19: Secondary | ICD-10-CM | POA: Diagnosis not present

## 2020-02-04 DIAGNOSIS — Z794 Long term (current) use of insulin: Secondary | ICD-10-CM | POA: Insufficient documentation

## 2020-02-04 DIAGNOSIS — Z7982 Long term (current) use of aspirin: Secondary | ICD-10-CM | POA: Diagnosis not present

## 2020-02-04 DIAGNOSIS — Z79899 Other long term (current) drug therapy: Secondary | ICD-10-CM | POA: Insufficient documentation

## 2020-02-04 DIAGNOSIS — J449 Chronic obstructive pulmonary disease, unspecified: Secondary | ICD-10-CM | POA: Insufficient documentation

## 2020-02-04 DIAGNOSIS — K529 Noninfective gastroenteritis and colitis, unspecified: Secondary | ICD-10-CM

## 2020-02-04 DIAGNOSIS — R109 Unspecified abdominal pain: Secondary | ICD-10-CM | POA: Diagnosis not present

## 2020-02-04 DIAGNOSIS — R197 Diarrhea, unspecified: Secondary | ICD-10-CM | POA: Diagnosis present

## 2020-02-04 LAB — CBC WITH DIFFERENTIAL/PLATELET
Abs Immature Granulocytes: 0.14 10*3/uL — ABNORMAL HIGH (ref 0.00–0.07)
Basophils Absolute: 0.1 10*3/uL (ref 0.0–0.1)
Basophils Relative: 1 %
Eosinophils Absolute: 0 10*3/uL (ref 0.0–0.5)
Eosinophils Relative: 0 %
HCT: 46.9 % (ref 39.0–52.0)
Hemoglobin: 15.2 g/dL (ref 13.0–17.0)
Immature Granulocytes: 1 %
Lymphocytes Relative: 6 %
Lymphs Abs: 0.7 10*3/uL (ref 0.7–4.0)
MCH: 33.9 pg (ref 26.0–34.0)
MCHC: 32.4 g/dL (ref 30.0–36.0)
MCV: 104.7 fL — ABNORMAL HIGH (ref 80.0–100.0)
Monocytes Absolute: 0.7 10*3/uL (ref 0.1–1.0)
Monocytes Relative: 6 %
Neutro Abs: 9.2 10*3/uL — ABNORMAL HIGH (ref 1.7–7.7)
Neutrophils Relative %: 86 %
Platelets: 435 10*3/uL — ABNORMAL HIGH (ref 150–400)
RBC: 4.48 MIL/uL (ref 4.22–5.81)
RDW: 17.9 % — ABNORMAL HIGH (ref 11.5–15.5)
WBC: 10.9 10*3/uL — ABNORMAL HIGH (ref 4.0–10.5)
nRBC: 0.2 % (ref 0.0–0.2)

## 2020-02-04 LAB — URINALYSIS, ROUTINE W REFLEX MICROSCOPIC
Bilirubin Urine: NEGATIVE
Glucose, UA: NEGATIVE mg/dL
Hgb urine dipstick: NEGATIVE
Ketones, ur: NEGATIVE mg/dL
Leukocytes,Ua: NEGATIVE
Nitrite: NEGATIVE
Protein, ur: NEGATIVE mg/dL
Specific Gravity, Urine: <1.005 — ABNORMAL LOW (ref 1.005–1.030)
pH: 5.5 (ref 5.0–8.0)

## 2020-02-04 LAB — COMPREHENSIVE METABOLIC PANEL
ALT: 14 U/L (ref 0–44)
AST: 18 U/L (ref 15–41)
Albumin: 4.6 g/dL (ref 3.5–5.0)
Alkaline Phosphatase: 75 U/L (ref 38–126)
Anion gap: 13 (ref 5–15)
BUN: 33 mg/dL — ABNORMAL HIGH (ref 8–23)
CO2: 20 mmol/L — ABNORMAL LOW (ref 22–32)
Calcium: 9.5 mg/dL (ref 8.9–10.3)
Chloride: 102 mmol/L (ref 98–111)
Creatinine, Ser: 1.56 mg/dL — ABNORMAL HIGH (ref 0.61–1.24)
GFR calc Af Amer: 52 mL/min — ABNORMAL LOW (ref >60)
GFR calc non Af Amer: 45 mL/min — ABNORMAL LOW (ref >60)
Glucose, Bld: 120 mg/dL — ABNORMAL HIGH (ref 70–99)
Potassium: 3.8 mmol/L (ref 3.5–5.1)
Sodium: 135 mmol/L (ref 135–145)
Total Bilirubin: 0.6 mg/dL (ref 0.3–1.2)
Total Protein: 7.2 g/dL (ref 6.5–8.1)

## 2020-02-04 LAB — LIPASE, BLOOD: Lipase: 160 U/L — ABNORMAL HIGH (ref 11–51)

## 2020-02-04 LAB — BRAIN NATRIURETIC PEPTIDE: B Natriuretic Peptide: 74.6 pg/mL (ref 0.0–100.0)

## 2020-02-04 LAB — RESPIRATORY PANEL BY RT PCR (FLU A&B, COVID)
Influenza A by PCR: NEGATIVE
Influenza B by PCR: NEGATIVE
SARS Coronavirus 2 by RT PCR: NEGATIVE

## 2020-02-04 LAB — TROPONIN I (HIGH SENSITIVITY)
Troponin I (High Sensitivity): 27 ng/L — ABNORMAL HIGH (ref <18)
Troponin I (High Sensitivity): 26 ng/L — ABNORMAL HIGH (ref <18)

## 2020-02-04 MED ORDER — ONDANSETRON HCL 4 MG/2ML IJ SOLN
4.0000 mg | Freq: Once | INTRAMUSCULAR | Status: AC
  Administered 2020-02-04: 4 mg via INTRAVENOUS
  Filled 2020-02-04: qty 2

## 2020-02-04 MED ORDER — SODIUM CHLORIDE 0.9 % IV BOLUS
1000.0000 mL | Freq: Once | INTRAVENOUS | Status: AC
  Administered 2020-02-04: 1000 mL via INTRAVENOUS

## 2020-02-04 MED ORDER — IOHEXOL 300 MG/ML  SOLN
100.0000 mL | Freq: Once | INTRAMUSCULAR | Status: AC | PRN
  Administered 2020-02-04: 100 mL via INTRAVENOUS

## 2020-02-04 NOTE — Progress Notes (Deleted)
HPI: FU cardiomyopathy/CHF. Echocardiogram in August of 2013 showed an ejection fraction of 35-40%. Cardiac catheterization in September of 2013 showed mild nonobstructive coronary disease. There was no hemodynamic evidence of restriction. Pulmonary capillary wedge pressure 10. Cardiac MRI in September of 2013 showed an ejection fraction of 34% with diffuse hypokinesis. There was no hyperenhancement or scar tissue and no evidence of cardiac hemochromatosis. TSH in September 2013 normal. 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 Patient subsequently had CRTP placed. Nuclear study March 2017 showed ejection fraction 32. There was prior inferior infarct but no ischemia. Treated medically as felt likely attenuation artifact. Echocardiogram repeated September 2021 and showed ejection fraction 30 to 10%, grade 2 diastolic dysfunction, moderate left atrial enlargement, moderate tricuspid regurgitation.  Since last seen,  Current Outpatient Medications  Medication Sig Dispense Refill  . allopurinol (ZYLOPRIM) 300 MG tablet Take 300 mg by mouth daily.    [name] aspirin 81 MG tablet Take 81 mg by mouth daily.    . BD PEN NEEDLE NANO U/F 32G X 4 MM MISC     . carvedilol (COREG) 12.5 MG tablet Take 1 tablet (12.5 mg total) by mouth 2 (two) times daily with a meal. 180 tablet 3  . citalopram (CELEXA) 10 MG tablet Take 5 mg by mouth daily.     [name] ENTRESTO 24-26 MG TAKE ONE TABLET BY MOUTH TWICE A DAY 60 tablet 4  . febuxostat (ULORIC) 40 MG tablet Take 80 mg by mouth daily.    . folic acid (FOLVITE) 1 MG tablet Take 1 mg by mouth daily.    . furosemide (LASIX) 40 MG tablet Take 1 tablet (40 mg total) by mouth daily. 90 tablet 3  . hydroxyurea (HYDREA) 500 MG capsule TAKE TWO CAPSULES BY MOUTH DAILY 180 capsule 5  . insulin degludec (TRESIBA FLEXTOUCH) 100 UNIT/ML SOPN FlexTouch Pen Inject 20 Units into the skin daily.    . methotrexate 2.5 MG tablet Take by mouth.    [name] omeprazole (PRILOSEC)  20 MG capsule Take 20 mg by mouth 2 (two) times daily before a meal.     . oxybutynin (DITROPAN-XL) 10 MG 24 hr tablet Take 10 mg by mouth daily.    . rosuvastatin (CRESTOR) 5 MG tablet Take 5 mg by mouth daily.    [name] Spacer/Aero-Holding Chambers (AEROCHAMBER MV) inhaler Use as instructed 1 each 0  . spironolactone (ALDACTONE) 25 MG tablet Take 0.5 tablets (12.5 mg total) by mouth daily. 45 tablet 3  . sulfaSALAzine (AZULFIDINE) 500 MG tablet Take by mouth.    . tamsulosin (FLOMAX) 0.4 MG CAPS capsule Take 0.4 mg by mouth.     No current facility-administered medications for this visit.   Facility-Administered Medications Ordered in Other Visits  Medication Dose Route Frequency Provider Last Rate Last Admin  . 0.9 %  sodium chloride infusion   Intravenous Continuous Volanda Napoleon, MD   Stopped at 05/16/13 1115     Past Medical History:  Diagnosis Date  . AV block, 2nd degree 2015   St. Jude Allure Quadra pulse generator [id number], model PM 3242  . Back pain   . Cardiomyopathy ([place])    Nonischemic 45%.   . CHF (congestive heart failure) ([place])   . Gout   . Hemochromatosis   . Hypertension   . Hypospadias 06/21/1950   born with  . Nephrolithiasis   . Polycythemia vera(238.4)     Past Surgical History:  Procedure Laterality Date  .  ANKLE SURGERY    . BI-VENTRICULAR PACEMAKER INSERTION N/A 04/29/2014   Procedure: BI-VENTRICULAR PACEMAKER INSERTION (CRT-P);  Surgeon: Deboraha Sprang, MD; Laterality: Left  St. Jude Allure Quadra pulse generator [id number], model PM [id number]  . PILONIDAL CYST EXCISION    . POSTERIOR LAMINECTOMY / DECOMPRESSION LUMBAR SPINE    . TONSILLECTOMY      Social History   Socioeconomic History  . Marital status: Married    Spouse name: Not on file  . Number of children: Not on file  . Years of education: Not on file  . Highest education level: Not on file  Occupational History  . Not on file  Tobacco Use  . Smoking status: Former Smoker    Packs/day:  1.00    Years: 44.00    Pack years: 44.00    Types: Cigarettes    Start date: 06/05/1968    Quit date: 04/05/2013    Years since quitting: 6.8  . Smokeless tobacco: Never Used  . Tobacco comment: quit 2 years ago  Vaping Use  . Vaping Use: Never used  Substance and Sexual Activity  . Alcohol use: Yes    Alcohol/week: 0.0 standard drinks    Comment: rare  . Drug use: No  . Sexual activity: Not Currently  Other Topics Concern  . Not on file  Social History Narrative   Lives at home with wife in a one story home.  Has no children.  Does not work.  Getting workman's comp.  Education: 4 years trade school.    Social Determinants of Health   Financial Resource Strain:   . Difficulty of Paying Living Expenses: Not on file  Food Insecurity:   . Worried About Charity fundraiser in the Last Year: Not on file  . Ran Out of Food in the Last Year: Not on file  Transportation Needs:   . Lack of Transportation (Medical): Not on file  . Lack of Transportation (Non-Medical): Not on file  Physical Activity:   . Days of Exercise per Week: Not on file  . Minutes of Exercise per Session: Not on file  Stress:   . Feeling of Stress : Not on file  Social Connections:   . Frequency of Communication with Friends and Family: Not on file  . Frequency of Social Gatherings with Friends and Family: Not on file  . Attends Religious Services: Not on file  . Active Member of Clubs or Organizations: Not on file  . Attends Archivist Meetings: Not on file  . Marital Status: Not on file  Intimate Partner Violence:   . Fear of Current or Ex-Partner: Not on file  . Emotionally Abused: Not on file  . Physically Abused: Not on file  . Sexually Abused: Not on file    Family History  Problem Relation Age of Onset  . Heart disease Maternal Grandmother        Pacemaker, MI  . Stroke Mother   . Other Father        Deceased, car fell on him  . Diabetes Sister   . Hypertension Sister     ROS:  no fevers or chills, productive cough, hemoptysis, dysphasia, odynophagia, melena, hematochezia, dysuria, hematuria, rash, seizure activity, orthopnea, PND, pedal edema, claudication. Remaining systems are negative.  Physical Exam: Well-developed well-nourished in no acute distress.  Skin is warm and dry.  HEENT is normal.  Neck is supple.  Chest is clear to auscultation with normal expansion.  Cardiovascular exam is regular  rate and rhythm.  Abdominal exam nontender or distended. No masses palpated. Extremities show no edema. neuro grossly intact  ECG- personally reviewed  A/P  1 nonischemic cardiomyopathy-LV function worse on recent echocardiogram.  Continue medical therapy.  Continue Entresto and carvedilol.  2 hypertension-blood pressure controlled.  Continue present medical regimen and follow.  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  Continue present dose of diuretic.  Check potassium and renal function.  4 CRT-P-electrophysiology.  5 morbid obesity-continued efforts at weight loss recommended.  Kirk Ruths, MD

## 2020-02-04 NOTE — ED Triage Notes (Signed)
Pt c/o abd pain , n/v/d and cough x 3 weeks

## 2020-02-04 NOTE — ED Notes (Signed)
Pt on monitor 

## 2020-02-04 NOTE — ED Provider Notes (Addendum)
[facility] EMERGENCY DEPARTMENT Provider Note   CSN: [id number] Arrival date & time: 02/04/20  1417     History Chief Complaint  Patient presents with  . Diarrhea    William Villanueva is a 69 y.o. male history of CHF with EF of 35%, polycythemia vera, secondary AV block with Saint Jude pacemaker, here presenting with chills and abdominal pain nausea vomiting and chest pain.  Patient states that he has not felt well for the last week or so.  He has some chills and nausea vomiting and diarrhea. He states that about 3 weeks ago he may have heart failure exacerbation so increase his Lasix.  He denies any sick contacts with Covid.  He also was vaccinated against COVID.  The history is provided by the patient.       Past Medical History:  Diagnosis Date  . AV block, 2nd degree 2015   St. Jude Allure Quadra pulse generator [id number], model PM [id number]  . Back pain   . Cardiomyopathy ([place])    Nonischemic 45%.   . CHF (congestive heart failure) ([place])   . Gout   . Hemochromatosis   . Hypertension   . Hypospadias 1951/03/07   born with  . Nephrolithiasis   . Polycythemia vera(238.4)     Patient Active Problem List   Diagnosis Date Noted  . Sprain of left wrist 08/12/2019  . CHF (congestive heart failure) ([place]) 07/17/2017  . COPD (chronic obstructive pulmonary disease) ([place]) 06/14/2017  . Acute renal failure (ARF) ([place]) 05/27/2016  . Hyponatremia 05/27/2016  . Hypokalemia 05/27/2016  . AKI (acute kidney injury) ([place]) 05/26/2016  . Depression 04/05/2016  . Kidney stones 04/05/2016  . Osteoarthritis 04/05/2016  . Sleep apnea 04/05/2016  . Bilateral carpal tunnel syndrome 12/02/2015  . Chronic combined systolic and diastolic congestive heart failure ([place]) 04/13/2015  . Orthostatic hypotension 01/19/2015  . Obesity 11/26/2014  . Enlarged prostate without lower urinary tract symptoms (luts) 10/29/2014  . Essential hypertension 10/29/2014  . GERD (gastroesophageal reflux  disease) 10/29/2014  . Panic attack 10/29/2014  . Changing skin lesion 10/16/2014  . Acute on chronic combined systolic and diastolic congestive heart failure ([place]) 10/14/2014  . Renal cyst 09/02/2014  . Pacemaker lead malfunction-elevated threshold RV lead 07/31/2014  . Dyspnea on exertion 06/21/2014  . Noninfective gastroenteritis and colitis 06/15/2014  . 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 Palpitations 06/11/2014  . 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  . Other cardiomyopathies ([place]) 04/29/2014  . Bradycardia 04/28/2014  . Chronic pain 02/12/2014  . 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 Muscular wasting and disuse atrophy 08/15/2013  . Lumbar and sacral osteoarthritis 05/19/2013  . Congestive dilated cardiomyopathy ([place]) 01/11/2012  . Compulsive tobacco user syndrome 12/13/2011  . Current tobacco use 12/13/2011  . Chest tightness   . Rash   . Back pain   . Hemochromatosis   . SOB (shortness of breath)   . Gout   . Polycythemia vera ([place]) 03/27/2011    Past Surgical History:  Procedure Laterality Date  . ANKLE SURGERY    . BI-VENTRICULAR PACEMAKER INSERTION N/A 04/29/2014   Procedure: BI-VENTRICULAR PACEMAKER INSERTION (CRT-P);  Surgeon: Deboraha Sprang, MD; Laterality: Left  St. Jude Allure Quadra pulse generator [id number], model PM [id number]  . PILONIDAL CYST EXCISION    . POSTERIOR LAMINECTOMY / DECOMPRESSION LUMBAR SPINE    . TONSILLECTOMY         Family History  Problem Relation Age of Onset  . Heart disease Maternal Grandmother  Pacemaker, MI  . Stroke Mother   . Other Father        Deceased, car fell on him  . Diabetes Sister   . Hypertension Sister     Social History   Tobacco Use  . Smoking status: Former Smoker    Packs/day: 1.00    Years: 44.00    Pack years: 44.00    Types: Cigarettes    Start date: 06/05/1968    Quit date: 04/05/2013    Years since quitting: 6.8  . Smokeless tobacco: Never Used  . Tobacco comment: quit 2 years ago    Vaping Use  . Vaping Use: Never used  Substance Use Topics  . Alcohol use: Yes    Alcohol/week: 0.0 standard drinks    Comment: rare  . Drug use: No    Home Medications Prior to Admission medications   Medication Sig Start Date End Date Taking? Authorizing Provider  allopurinol (ZYLOPRIM) 300 MG tablet Take 300 mg by mouth daily. 06/30/19   [provider]  aspirin 81 MG tablet Take 81 mg by mouth daily.    [provider]  BD PEN NEEDLE NANO U/F 32G X 4 MM MISC  06/16/18   [provider]  carvedilol (COREG) 12.5 MG tablet Take 1 tablet (12.5 mg total) by mouth 2 (two) times daily with a meal. 10/14/19   Crenshaw, Denice Bors, MD  citalopram (CELEXA) 10 MG tablet Take 5 mg by mouth daily.  01/27/16   [provider]  ENTRESTO 24-26 MG TAKE ONE TABLET BY MOUTH TWICE A DAY 07/09/19   Lelon Perla, MD  febuxostat (ULORIC) 40 MG tablet Take 80 mg by mouth daily.    [provider]  folic acid (FOLVITE) 1 MG tablet Take 1 mg by mouth daily.    [provider]  furosemide (LASIX) 40 MG tablet Take 1 tablet (40 mg total) by mouth daily. 01/02/20 04/01/20  Patsey Berthold, NP  hydroxyurea (HYDREA) 500 MG capsule TAKE TWO CAPSULES BY MOUTH DAILY 09/09/19   Volanda Napoleon, MD  insulin degludec (TRESIBA FLEXTOUCH) 100 UNIT/ML SOPN FlexTouch Pen Inject 20 Units into the skin daily.    [provider]  methotrexate 2.5 MG tablet Take by mouth. 11/07/19   [provider]  omeprazole (PRILOSEC) 20 MG capsule Take 20 mg by mouth 2 (two) times daily before a meal.  07/05/16   [provider]  oxybutynin (DITROPAN-XL) 10 MG 24 hr tablet Take 10 mg by mouth daily. 10/02/19   [provider]  rosuvastatin (CRESTOR) 5 MG tablet Take 5 mg by mouth daily.    [provider]  Spacer/Aero-Holding Chambers (AEROCHAMBER MV) inhaler Use as instructed 01/09/18   Collene Gobble, MD  spironolactone (ALDACTONE) 25 MG tablet Take 0.5  tablets (12.5 mg total) by mouth daily. 10/14/19   Lelon Perla, MD  sulfaSALAzine (AZULFIDINE) 500 MG tablet Take by mouth. 11/05/19   [provider]  tamsulosin (FLOMAX) 0.4 MG CAPS capsule Take 0.4 mg by mouth. 08/03/17   [provider]    Allergies    Bupropion, Fluoxetine, Ibuprofen, Prednisone, Temazepam, Trazodone and nefazodone, Cephalexin, Fluoxetine hcl, and Prozac [fluoxetine hcl]  Review of Systems   Review of Systems  Constitutional: Positive for chills.  Gastrointestinal: Positive for abdominal pain and diarrhea.  All other systems reviewed and are negative.   Physical Exam Updated Vital Signs BP 123/74   Pulse 87   Temp 97.9 F (36.6 C) (  Oral)   Resp 14   Ht 5' 10"  (1.778 m)   Wt 128.8 kg   SpO2 93%   BMI 40.75 kg/m   Physical Exam Vitals and nursing note reviewed.  Constitutional:      Comments: Dehydrated, uncomfortable   HENT:     Head: Normocephalic.     Nose: Nose normal.     Mouth/Throat:     Mouth: Mucous membranes are dry.  Eyes:     Extraocular Movements: Extraocular movements intact.     Pupils: Pupils are equal, round, and reactive to light.  Cardiovascular:     Rate and Rhythm: Normal rate and regular rhythm.     Pulses: Normal pulses.     Heart sounds: Normal heart sounds.  Pulmonary:     Effort: Pulmonary effort is normal.     Breath sounds: Normal breath sounds.  Abdominal:     General: Abdomen is flat.     Palpations: Abdomen is soft.     Comments: + epigastric tenderness   Musculoskeletal:        General: Normal range of motion.     Cervical back: Normal range of motion and neck supple.  Skin:    General: Skin is warm.     Capillary Refill: Capillary refill takes less than 2 seconds.  Neurological:     General: No focal deficit present.     Mental Status: He is oriented to person, place, and time.  Psychiatric:        Mood and Affect: Mood normal.        Behavior: Behavior normal.     ED Results /  Procedures / Treatments   Labs (all labs ordered are listed, but only abnormal results are displayed) Labs Reviewed  CBC WITH DIFFERENTIAL/PLATELET - Abnormal; Notable for the following components:      Result Value   WBC 10.9 (*)    MCV 104.7 (*)    RDW 17.9 (*)    Platelets 435 (*)    Neutro Abs 9.2 (*)    Abs Immature Granulocytes 0.14 (*)    All other components within normal limits  COMPREHENSIVE METABOLIC PANEL - Abnormal; Notable for the following components:   CO2 20 (*)    Glucose, Bld 120 (*)    BUN 33 (*)    Creatinine, Ser 1.56 (*)    GFR calc non Af Amer 45 (*)    GFR calc Af Amer 52 (*)    All other components within normal limits  LIPASE, BLOOD - Abnormal; Notable for the following components:   Lipase 160 (*)    All other components within normal limits  URINALYSIS, ROUTINE W REFLEX MICROSCOPIC - Abnormal; Notable for the following components:   Specific Gravity, Urine <1.005 (*)    All other components within normal limits  TROPONIN I (HIGH SENSITIVITY) - Abnormal; Notable for the following components:   Troponin I (High Sensitivity) 27 (*)    All other components within normal limits  TROPONIN I (HIGH SENSITIVITY) - Abnormal; Notable for the following components:   Troponin I (High Sensitivity) 26 (*)    All other components within normal limits  RESPIRATORY PANEL BY RT PCR (FLU A&B, COVID)  BRAIN NATRIURETIC PEPTIDE    EKG None  ED ECG REPORT   Date: 02/04/2020  Rate: 93  Rhythm: normal sinus rhythm  QRS Axis: normal  Intervals: normal  ST/T Wave abnormalities: IVCD, unchanged   Conduction Disutrbances:none  Narrative Interpretation:   Old EKG Reviewed:  none available  I have personally reviewed the EKG tracing and agree with the computerized printout as noted.    Radiology CT ABDOMEN PELVIS W CONTRAST  Result Date: 02/04/2020 CLINICAL DATA:  Abdominal distension with elevated lipase. EXAM: CT ABDOMEN AND PELVIS WITH CONTRAST TECHNIQUE:  Multidetector CT imaging of the abdomen and pelvis was performed using the standard protocol following bolus administration of intravenous contrast. CONTRAST:  121m OMNIPAQUE IOHEXOL 300 MG/ML  SOLN COMPARISON:  CT July 03, 2017 FINDINGS: Lower chest: No acute abnormality. Hepatobiliary: Hepatic steatosis. The liver is mildly enlarged, measuring up to 19 cm in craniocaudal dimension. No focal liver lesion identified. The gallbladder is nondistended. No gallbladder wall thickening or evidence of pericholecystic fluid. No radiodense gallstones. Pancreas: Similar fatty replacement of the pancreatic head. No peripancreatic fat stranding or fluid collection. No pancreatic ductal dilation. Spleen: Mild splenomegaly, measuring up to 13.5 cm. No splenic mass. Adrenals/Urinary Tract: Normal appearance of the adrenal glands. No hydronephrosis. There are multiple bilateral low-attenuation renal lesions, which were better characterized on ultrasound from 07/01/2018 as cysts. Some of these lesions are too small to characterize by CT. Stomach/Bowel: Stomach is within normal limits. Appendix is not visualized. No evidence of bowel wall thickening, distention, or inflammatory changes. Colonic diverticulosis without evidence of diverticulitis. Vascular/Lymphatic: Calcific atherosclerosis of the aorta and its branch vessels. No significant vascular findings are present. No enlarged abdominal or pelvic lymph nodes. Reproductive: Nonspecific prostatic calcifications. Other: No abdominal wall hernia or abnormality. No abdominopelvic ascites. Musculoskeletal: No acute fracture identified. Multilevel degenerative changes of the lumbar spine. IMPRESSION: 1. No acute findings. No pancreatic inflammatory changes or peripancreatic fluid collection. 2. Mild hepatic steatosis and mild hepatosplenomegaly. Electronically Signed   By: FMargaretha SheffieldMD   On: 02/04/2020 20:09   DG Chest Portable 1 View  Result Date: 02/04/2020 CLINICAL  DATA:  Nausea, vomiting, diarrhea and cough x3 weeks. EXAM: PORTABLE CHEST 1 VIEW COMPARISON:  June 27, 2019 FINDINGS: There is a dual lead AICD. There is no evidence of acute infiltrate, pleural effusion or pneumothorax. The heart size and mediastinal contours are within normal limits. A prominent pulmonary vessel is again seen along the infrahilar region on the right. The visualized skeletal structures are unremarkable. IMPRESSION: No active cardiopulmonary disease. Electronically Signed   By: TVirgina NorfolkM.D.   On: 02/04/2020 15:17    Procedures Procedures (including critical care time)  Medications Ordered in ED Medications  sodium chloride 0.9 % bolus 1,000 mL (0 mLs Intravenous Stopped 02/04/20 2108)  ondansetron (ZOFRAN) injection 4 mg (4 mg Intravenous Given 02/04/20 1937)  iohexol (OMNIPAQUE) 300 MG/ML solution 100 mL (100 mLs Intravenous Contrast Given 02/04/20 1917)    ED Course  I have reviewed the triage vital signs and the nursing notes.  Pertinent labs & imaging results that were available during my care of the patient were reviewed by me and considered in my medical decision making (see chart for details).    MDM Rules/Calculators/A&P                         CCollan Schoenfeldis a 69y.o. male here presenting with chills and vomiting and diarrhea and chest pain.  Likely viral gastroenteritis versus Covid.  He does have history of heart failure but appears dehydrated.  Plan to get CBC, CMP, lipase, troponin, BNP.  We will also get Covid test and CT abdomen pelvis. Will hydrate patient.   10:51 PM Troponin slightly elevated but  stable.  Covid is negative.  Lipase 160.  CT did not show any pancreatitis.  Patient tolerated p.o. in the ED.  Has Zofran at home.  Likely viral gastroenteritis.  Stable for discharge.  Final Clinical Impression(s) / ED Diagnoses Final diagnoses:  Gastroenteritis    Rx / DC Orders ED Discharge Orders    None       Drenda Freeze,  MD 02/04/20 2231    Drenda Freeze, MD 02/04/20 2252

## 2020-02-04 NOTE — Discharge Instructions (Signed)
You likely have a stomach virus.  Please stay hydrated and take your medicines as prescribed  See your doctor for follow-up this week.  Return to ER if you have worse abdominal pain, fever, vomiting.

## 2020-02-05 ENCOUNTER — Telehealth: Payer: Self-pay | Admitting: *Deleted

## 2020-02-05 NOTE — Telephone Encounter (Signed)
Call placed to patient's wife to check on patient's status after ED visit yesterday.  Pt.'s wife states that pt was diagnosed with viral gastroenteritis and that he is feeling better today. Informed pt.'s wife to contact this office with any concerns. Pt.'s wife is appreciative of call.

## 2020-02-09 ENCOUNTER — Encounter: Payer: Self-pay | Admitting: Internal Medicine

## 2020-02-09 ENCOUNTER — Other Ambulatory Visit: Payer: Self-pay

## 2020-02-09 ENCOUNTER — Ambulatory Visit (INDEPENDENT_AMBULATORY_CARE_PROVIDER_SITE_OTHER): Payer: Medicare Other | Admitting: Internal Medicine

## 2020-02-09 VITALS — BP 104/60 | HR 58 | Ht 70.0 in | Wt 282.4 lb

## 2020-02-09 DIAGNOSIS — I5022 Chronic systolic (congestive) heart failure: Secondary | ICD-10-CM

## 2020-02-09 DIAGNOSIS — Z950 Presence of cardiac pacemaker: Secondary | ICD-10-CM | POA: Diagnosis not present

## 2020-02-09 DIAGNOSIS — I420 Dilated cardiomyopathy: Secondary | ICD-10-CM | POA: Diagnosis not present

## 2020-02-09 LAB — CUP PACEART INCLINIC DEVICE CHECK
Battery Remaining Longevity: 64 mo
Battery Voltage: 2.92 V
Brady Statistic RA Percent Paced: 5.2 %
Brady Statistic RV Percent Paced: 97 %
Date Time Interrogation Session: 20211004094000
Implantable Lead Implant Date: 20151223
Implantable Lead Implant Date: 20151223
Implantable Lead Implant Date: 20151223
Implantable Lead Location: 753858
Implantable Lead Location: 753859
Implantable Lead Location: 753860
Implantable Pulse Generator Implant Date: 20151223
Lead Channel Impedance Value: 462.5 Ohm
Lead Channel Impedance Value: 512.5 Ohm
Lead Channel Impedance Value: 837.5 Ohm
Lead Channel Pacing Threshold Amplitude: 1.125 V
Lead Channel Pacing Threshold Amplitude: 1.375 V
Lead Channel Pacing Threshold Amplitude: 1.5 V
Lead Channel Pacing Threshold Amplitude: 1.5 V
Lead Channel Pacing Threshold Pulse Width: 0.4 ms
Lead Channel Pacing Threshold Pulse Width: 0.4 ms
Lead Channel Pacing Threshold Pulse Width: 0.4 ms
Lead Channel Pacing Threshold Pulse Width: 0.7 ms
Lead Channel Sensing Intrinsic Amplitude: 4.4 mV
Lead Channel Sensing Intrinsic Amplitude: 4.7 mV
Lead Channel Setting Pacing Amplitude: 2.125
Lead Channel Setting Pacing Amplitude: 2.375
Lead Channel Setting Pacing Amplitude: 2.5 V
Lead Channel Setting Pacing Pulse Width: 0.4 ms
Lead Channel Setting Pacing Pulse Width: 0.7 ms
Lead Channel Setting Sensing Sensitivity: 2 mV
Pulse Gen Model: 3242
Pulse Gen Serial Number: 7701275

## 2020-02-09 NOTE — Progress Notes (Signed)
Electrophysiology Office Note   Date:  02/09/2020   ID:  William Villanueva, DOB 11-Jun-1950, MRN [id number]  PCP:  Antony Contras, MD  Cardiologist:  [facility] Primary Electrophysiologist:    [name], MD    No chief complaint on file.    History of Present Illness: William Villanueva is a 69 y.o. male is   seen in follow-up for CRT-P implantation for nonischemic cardiomyopathy. He had presented to 12/15 with symptomatic high-grade heart block. Echo reevaluation 2/16 demonstrated near normalization of LV systolic function; he been readmitted at that time because of pain which dated back to his device implantation. Nuclear medicine 2/16 had an EF of 35%    DATE TEST EF   2/16 Myoview  35%   3/17 Myoview    32 %   1/20 Echo  40-45%   9/21 Echo  30-35%     Worsening shortness of breath and seen by [facility]-NP 8/21.  Diuretics increased.  Echo updated with no change in LV function  Intercurrent NV and D  Seen in ER  Lipase 160 (nl>51)  CT neg for pancreatitis  The nauseated with some diarrhea and vomiting.  P.o. intake is very poor, both food and fluid.    Date Cr K Hgb BNP  9/19 1.44 4.1 13.7   9/21  1.56 3.8 15.2 2864     Past Medical History:  Diagnosis Date  . AV block, 2nd degree 2015   St. Jude Allure Quadra pulse generator [id number], model PM 3242  . Back pain   . Cardiomyopathy ([place])    Nonischemic 45%.   . CHF (congestive heart failure) ([place])   . Gout   . Hemochromatosis   . Hypertension   . Hypospadias Jul 04, 1950   born with  . Nephrolithiasis   . 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 ANKLE SURGERY    . BI-VENTRICULAR PACEMAKER INSERTION N/A 04/29/2014   Procedure: BI-VENTRICULAR PACEMAKER INSERTION (CRT-P);  Surgeon: Deboraha Sprang, MD; Laterality: Left  St. Jude Allure Quadra pulse generator [phone], model PM [phone]  . PILONIDAL CYST EXCISION    . POSTERIOR LAMINECTOMY / DECOMPRESSION LUMBAR SPINE    . TONSILLECTOMY        Current Outpatient Medications  Medication Sig Dispense Refill  . allopurinol (ZYLOPRIM) 300 MG tablet Take 300 mg by mouth daily.    [name] aspirin 81 MG tablet Take 81 mg by mouth daily.    . BD PEN NEEDLE NANO U/F 32G X 4 MM MISC     . carvedilol (COREG) 12.5 MG tablet Take 1 tablet (12.5 mg total) by mouth 2 (two) times daily with a meal. 180 tablet 3  . citalopram (CELEXA) 10 MG tablet Take 5 mg by mouth daily.     [name] ENTRESTO 24-26 MG TAKE ONE TABLET BY MOUTH TWICE A DAY 60 tablet 4  . febuxostat (ULORIC) 40 MG tablet Take 80 mg by mouth daily.    . folic acid (FOLVITE) 1 MG tablet Take 1 mg by mouth daily.    . furosemide (LASIX) 40 MG tablet Take 1 tablet (40 mg total) by mouth daily. 90 tablet 3  . hydroxyurea (HYDREA) 500 MG capsule TAKE TWO CAPSULES BY MOUTH DAILY 180 capsule 5  . insulin degludec (TRESIBA FLEXTOUCH) 100 UNIT/ML SOPN FlexTouch Pen Inject 20 Units into the skin daily.    . methotrexate 2.5 MG tablet Take by mouth.    [name] omeprazole (PRILOSEC) 20 MG capsule Take  20 mg by mouth 2 (two) times daily before a meal.     . ondansetron (ZOFRAN-ODT) 4 MG disintegrating tablet Take 4 mg by mouth every 8 (eight) hours as needed for nausea or vomiting.    [name] oxybutynin (DITROPAN-XL) 10 MG 24 hr tablet Take 10 mg by mouth daily.    . rosuvastatin (CRESTOR) 5 MG tablet Take 5 mg by mouth daily.    [name] spironolactone (ALDACTONE) 25 MG tablet Take 0.5 tablets (12.5 mg total) by mouth daily. 45 tablet 3  . sulfaSALAzine (AZULFIDINE) 500 MG tablet Take by mouth.    . tamsulosin (FLOMAX) 0.4 MG CAPS capsule Take 0.4 mg by mouth.     No current facility-administered medications for this visit.   Facility-Administered Medications Ordered in Other Visits  Medication Dose Route Frequency Provider Last Rate Last Admin  . 0.9 %  sodium chloride infusion   Intravenous Continuous Volanda Napoleon, MD   Stopped at 05/16/13 1115    Allergies:   Bupropion, Fluoxetine, Ibuprofen, Prednisone,  Temazepam, Trazodone and nefazodone, Cephalexin, Fluoxetine hcl, and Prozac [fluoxetine hcl]   Social History:  The patient  reports that he quit smoking about 6 years ago. His smoking use included cigarettes. He started smoking about 51 years ago. He has a 44.00 pack-year smoking history. He has never used smokeless tobacco. He reports current alcohol use. He reports that he does not use drugs.   Family History:  The patient's    family history includes Diabetes in his sister; Heart disease in his maternal grandmother; Hypertension in his sister; Other in his father; Stroke in his mother.    ROS:  Please see the history of present illness and past medical history  Otherwise, all other systems were reviewed and were negative except .     PHYSICAL EXAM: VS:  BP 104/60   Pulse (!) 58   Ht 5' 10"  (1.778 m)   Wt 282 lb 7.2 oz (128.1 kg)   SpO2 97%   BMI 40.53 kg/m  , BMI Body mass index is 40.53 kg/m. Well developed and well nourished in no acute distress HENT normal Neck supple with JVP-flat Clear Device pocket well healed; without hematoma or erythema.  There is no tethering  Regular rate and rhythm, 2/6* murmur Abd-soft with active BS No Clubbing cyanosis   edema Skin-warm and dry A & Oriented  Grossly normal sensory and motor function  ECG *P-synchronous/ AV  pacing QRS rS V1 and qR 1  ECG 12/15 demonstrated positive QRS lead V1 and negative QRS lead I   Device interrogation is reviewed today in detail.  See PaceArt for details.     ASSESSMENT AND PLAN:  NICM  CHF chronic sys/HFpFF  Hypertension  Morbidly obese   Pacemaker-CRT-St. Jude     Chest Pain    Heart failure status is currently stable.  His [place] has normalized.  Indeed, I suspect he is dehydrated secondary to his GI illness.  We will hold his furosemide for right now and encourage p.o. intake with electrolyte solution Gatorade, Powerade or something.  His ECG has changed.  This suggest that there may be  a role for optimization of his CRT.  Given how lousy feels, however, we will bring him back in a couple of months and do it at that time.  On guideline directed therapy with his cardiomyopathy.  May benefit from [place].  Will defer to Dr. Cherylann Ratel.     Disposition:   FU with AS 67m  Signed, [name], MD  02/09/2020 9:50 AM     [address] [place] [address] [phone] (office) [phone] (fax)

## 2020-02-09 NOTE — Patient Instructions (Addendum)
Medication Instructions:   ** Stop taking your Furosemide for now until you recover from Your GI illness. *If you need a refill on your cardiac medications before your next appointment, please call your pharmacy*   Lab Work: None ordered.  If you have labs (blood work) drawn today and your tests are completely normal, you will receive your results only by: [name] MyChart Message (if you have MyChart) OR . A paper copy in the mail If you have any lab test that is abnormal or we need to change your treatment, we will call you to review the results.   Testing/Procedures: None ordered.    Follow-Up: At [facility], you and your health needs are our priority.  As part of our continuing mission to provide you with exceptional heart care, we have created designated Provider Care Teams.  These Care Teams include your primary Cardiologist (physician) and Advanced Practice Providers (APPs -  Physician Assistants and Nurse Practitioners) who all work together to provide you with the care you need, when you need it.  We recommend signing up for the patient portal called "MyChart".  Sign up information is provided on this After Visit Summary.  MyChart is used to connect with patients for Virtual Visits (Telemedicine).  Patients are able to view lab/test results, encounter notes, upcoming appointments, etc.  Non-urgent messages can be sent to your provider as well.   To learn more about what you can do with MyChart, go to [email or web address].    Your next appointment:   3 months with Amber for Echocardiographic optimization of CRT   Other Instructions We will change you appointment with Dr Stanford Breed to 1 month from now.

## 2020-02-11 ENCOUNTER — Ambulatory Visit: Payer: Medicare Other | Admitting: Cardiology

## 2020-02-12 ENCOUNTER — Emergency Department (HOSPITAL_COMMUNITY): Payer: Medicare Other

## 2020-02-12 ENCOUNTER — Other Ambulatory Visit: Payer: Self-pay

## 2020-02-12 ENCOUNTER — Inpatient Hospital Stay (HOSPITAL_COMMUNITY)
Admission: EM | Admit: 2020-02-12 | Discharge: 2020-02-18 | DRG: 385 | Disposition: A | Discharge status: Home Health | Payer: Medicare Other | Attending: Internal Medicine | Admitting: Internal Medicine

## 2020-02-12 DIAGNOSIS — N170 Acute kidney failure with tubular necrosis: Secondary | ICD-10-CM | POA: Diagnosis present

## 2020-02-12 DIAGNOSIS — Z833 Family history of diabetes mellitus: Secondary | ICD-10-CM

## 2020-02-12 DIAGNOSIS — M549 Dorsalgia, unspecified: Secondary | ICD-10-CM | POA: Diagnosis present

## 2020-02-12 DIAGNOSIS — Z7982 Long term (current) use of aspirin: Secondary | ICD-10-CM

## 2020-02-12 DIAGNOSIS — N1831 Chronic kidney disease, stage 3a: Secondary | ICD-10-CM | POA: Diagnosis present

## 2020-02-12 DIAGNOSIS — E1122 Type 2 diabetes mellitus with diabetic chronic kidney disease: Secondary | ICD-10-CM | POA: Diagnosis present

## 2020-02-12 DIAGNOSIS — I5022 Chronic systolic (congestive) heart failure: Secondary | ICD-10-CM | POA: Diagnosis present

## 2020-02-12 DIAGNOSIS — Z87891 Personal history of nicotine dependence: Secondary | ICD-10-CM

## 2020-02-12 DIAGNOSIS — D75839 Thrombocytosis, unspecified: Secondary | ICD-10-CM | POA: Diagnosis not present

## 2020-02-12 DIAGNOSIS — I130 Hypertensive heart and chronic kidney disease with heart failure and stage 1 through stage 4 chronic kidney disease, or unspecified chronic kidney disease: Secondary | ICD-10-CM | POA: Diagnosis present

## 2020-02-12 DIAGNOSIS — E662 Morbid (severe) obesity with alveolar hypoventilation: Secondary | ICD-10-CM | POA: Diagnosis present

## 2020-02-12 DIAGNOSIS — E1165 Type 2 diabetes mellitus with hyperglycemia: Secondary | ICD-10-CM | POA: Diagnosis not present

## 2020-02-12 DIAGNOSIS — R578 Other shock: Secondary | ICD-10-CM | POA: Diagnosis present

## 2020-02-12 DIAGNOSIS — E860 Dehydration: Secondary | ICD-10-CM | POA: Diagnosis present

## 2020-02-12 DIAGNOSIS — K222 Esophageal obstruction: Secondary | ICD-10-CM | POA: Diagnosis present

## 2020-02-12 DIAGNOSIS — I441 Atrioventricular block, second degree: Secondary | ICD-10-CM | POA: Diagnosis present

## 2020-02-12 DIAGNOSIS — M109 Gout, unspecified: Secondary | ICD-10-CM | POA: Diagnosis present

## 2020-02-12 DIAGNOSIS — K5730 Diverticulosis of large intestine without perforation or abscess without bleeding: Secondary | ICD-10-CM | POA: Diagnosis present

## 2020-02-12 DIAGNOSIS — D62 Acute posthemorrhagic anemia: Secondary | ICD-10-CM | POA: Diagnosis present

## 2020-02-12 DIAGNOSIS — K269 Duodenal ulcer, unspecified as acute or chronic, without hemorrhage or perforation: Secondary | ICD-10-CM | POA: Diagnosis present

## 2020-02-12 DIAGNOSIS — T189XXA Foreign body of alimentary tract, part unspecified, initial encounter: Secondary | ICD-10-CM

## 2020-02-12 DIAGNOSIS — Z79899 Other long term (current) drug therapy: Secondary | ICD-10-CM

## 2020-02-12 DIAGNOSIS — J449 Chronic obstructive pulmonary disease, unspecified: Secondary | ICD-10-CM | POA: Diagnosis present

## 2020-02-12 DIAGNOSIS — A084 Viral intestinal infection, unspecified: Secondary | ICD-10-CM | POA: Diagnosis present

## 2020-02-12 DIAGNOSIS — R1084 Generalized abdominal pain: Secondary | ICD-10-CM | POA: Diagnosis present

## 2020-02-12 DIAGNOSIS — D689 Coagulation defect, unspecified: Secondary | ICD-10-CM | POA: Diagnosis present

## 2020-02-12 DIAGNOSIS — K51919 Ulcerative colitis, unspecified with unspecified complications: Principal | ICD-10-CM | POA: Diagnosis present

## 2020-02-12 DIAGNOSIS — R42 Dizziness and giddiness: Secondary | ICD-10-CM

## 2020-02-12 DIAGNOSIS — Z823 Family history of stroke: Secondary | ICD-10-CM

## 2020-02-12 DIAGNOSIS — Z8249 Family history of ischemic heart disease and other diseases of the circulatory system: Secondary | ICD-10-CM

## 2020-02-12 DIAGNOSIS — R197 Diarrhea, unspecified: Secondary | ICD-10-CM | POA: Diagnosis not present

## 2020-02-12 DIAGNOSIS — Z888 Allergy status to other drugs, medicaments and biological substances status: Secondary | ICD-10-CM

## 2020-02-12 DIAGNOSIS — K591 Functional diarrhea: Secondary | ICD-10-CM | POA: Diagnosis not present

## 2020-02-12 DIAGNOSIS — Z6841 Body Mass Index (BMI) 40.0 and over, adult: Secondary | ICD-10-CM

## 2020-02-12 DIAGNOSIS — E872 Acidosis: Secondary | ICD-10-CM | POA: Diagnosis present

## 2020-02-12 DIAGNOSIS — N179 Acute kidney failure, unspecified: Secondary | ICD-10-CM

## 2020-02-12 DIAGNOSIS — E861 Hypovolemia: Secondary | ICD-10-CM | POA: Diagnosis present

## 2020-02-12 DIAGNOSIS — K529 Noninfective gastroenteritis and colitis, unspecified: Secondary | ICD-10-CM

## 2020-02-12 DIAGNOSIS — Z886 Allergy status to analgesic agent status: Secondary | ICD-10-CM

## 2020-02-12 DIAGNOSIS — M7989 Other specified soft tissue disorders: Secondary | ICD-10-CM | POA: Diagnosis not present

## 2020-02-12 DIAGNOSIS — K449 Diaphragmatic hernia without obstruction or gangrene: Secondary | ICD-10-CM | POA: Diagnosis present

## 2020-02-12 DIAGNOSIS — Z794 Long term (current) use of insulin: Secondary | ICD-10-CM

## 2020-02-12 DIAGNOSIS — Z95810 Presence of automatic (implantable) cardiac defibrillator: Secondary | ICD-10-CM

## 2020-02-12 DIAGNOSIS — M79609 Pain in unspecified limb: Secondary | ICD-10-CM | POA: Diagnosis not present

## 2020-02-12 DIAGNOSIS — Q549 Hypospadias, unspecified: Secondary | ICD-10-CM

## 2020-02-12 DIAGNOSIS — I428 Other cardiomyopathies: Secondary | ICD-10-CM | POA: Diagnosis present

## 2020-02-12 DIAGNOSIS — R112 Nausea with vomiting, unspecified: Secondary | ICD-10-CM | POA: Diagnosis present

## 2020-02-12 DIAGNOSIS — K219 Gastro-esophageal reflux disease without esophagitis: Secondary | ICD-10-CM | POA: Diagnosis present

## 2020-02-12 DIAGNOSIS — R55 Syncope and collapse: Secondary | ICD-10-CM | POA: Diagnosis not present

## 2020-02-12 DIAGNOSIS — Z20822 Contact with and (suspected) exposure to covid-19: Secondary | ICD-10-CM | POA: Diagnosis present

## 2020-02-12 DIAGNOSIS — E876 Hypokalemia: Secondary | ICD-10-CM | POA: Diagnosis present

## 2020-02-12 DIAGNOSIS — Z881 Allergy status to other antibiotic agents status: Secondary | ICD-10-CM

## 2020-02-12 DIAGNOSIS — N400 Enlarged prostate without lower urinary tract symptoms: Secondary | ICD-10-CM | POA: Diagnosis present

## 2020-02-12 DIAGNOSIS — Z9119 Patient's noncompliance with other medical treatment and regimen: Secondary | ICD-10-CM

## 2020-02-12 DIAGNOSIS — D45 Polycythemia vera: Secondary | ICD-10-CM | POA: Diagnosis not present

## 2020-02-12 LAB — COMPREHENSIVE METABOLIC PANEL
ALT: 7 U/L (ref 0–44)
AST: 18 U/L (ref 15–41)
Albumin: 3.4 g/dL — ABNORMAL LOW (ref 3.5–5.0)
Alkaline Phosphatase: 64 U/L (ref 38–126)
Anion gap: 12 (ref 5–15)
BUN: 61 mg/dL — ABNORMAL HIGH (ref 8–23)
CO2: 16 mmol/L — ABNORMAL LOW (ref 22–32)
Calcium: 8.4 mg/dL — ABNORMAL LOW (ref 8.9–10.3)
Chloride: 105 mmol/L (ref 98–111)
Creatinine, Ser: 2.12 mg/dL — ABNORMAL HIGH (ref 0.61–1.24)
GFR calc non Af Amer: 31 mL/min — ABNORMAL LOW (ref >60)
Glucose, Bld: 126 mg/dL — ABNORMAL HIGH (ref 70–99)
Potassium: 3.1 mmol/L — ABNORMAL LOW (ref 3.5–5.1)
Sodium: 133 mmol/L — ABNORMAL LOW (ref 135–145)
Total Bilirubin: 0.3 mg/dL (ref 0.3–1.2)
Total Protein: 6.3 g/dL — ABNORMAL LOW (ref 6.5–8.1)

## 2020-02-12 LAB — CREATININE, SERUM
Creatinine, Ser: 2.15 mg/dL — ABNORMAL HIGH (ref 0.61–1.24)
GFR calc non Af Amer: 31 mL/min — ABNORMAL LOW (ref >60)

## 2020-02-12 LAB — CBC WITH DIFFERENTIAL/PLATELET
Abs Immature Granulocytes: 0 10*3/uL (ref 0.00–0.07)
Basophils Absolute: 0.4 10*3/uL — ABNORMAL HIGH (ref 0.0–0.1)
Basophils Relative: 4 %
Eosinophils Absolute: 0.9 10*3/uL — ABNORMAL HIGH (ref 0.0–0.5)
Eosinophils Relative: 8 %
HCT: 41 % (ref 39.0–52.0)
Hemoglobin: 13.7 g/dL (ref 13.0–17.0)
Lymphocytes Relative: 3 %
Lymphs Abs: 0.3 10*3/uL — ABNORMAL LOW (ref 0.7–4.0)
MCH: 34.9 pg — ABNORMAL HIGH (ref 26.0–34.0)
MCHC: 33.4 g/dL (ref 30.0–36.0)
MCV: 104.3 fL — ABNORMAL HIGH (ref 80.0–100.0)
Monocytes Absolute: 0.4 10*3/uL (ref 0.1–1.0)
Monocytes Relative: 4 %
Neutro Abs: 8.9 10*3/uL — ABNORMAL HIGH (ref 1.7–7.7)
Neutrophils Relative %: 81 %
Platelets: 913 10*3/uL (ref 150–400)
RBC: 3.93 MIL/uL — ABNORMAL LOW (ref 4.22–5.81)
RDW: 18.3 % — ABNORMAL HIGH (ref 11.5–15.5)
WBC: 11 10*3/uL — ABNORMAL HIGH (ref 4.0–10.5)
nRBC: 0 % (ref 0.0–0.2)

## 2020-02-12 LAB — CBC
HCT: 41.7 % (ref 39.0–52.0)
Hemoglobin: 13.8 g/dL (ref 13.0–17.0)
MCH: 34.5 pg — ABNORMAL HIGH (ref 26.0–34.0)
MCHC: 33.1 g/dL (ref 30.0–36.0)
MCV: 104.3 fL — ABNORMAL HIGH (ref 80.0–100.0)
Platelets: 913 10*3/uL (ref 150–400)
RBC: 4 MIL/uL — ABNORMAL LOW (ref 4.22–5.81)
RDW: 18.4 % — ABNORMAL HIGH (ref 11.5–15.5)
WBC: 11.3 10*3/uL — ABNORMAL HIGH (ref 4.0–10.5)
nRBC: 0 % (ref 0.0–0.2)

## 2020-02-12 LAB — C DIFFICILE QUICK SCREEN W PCR REFLEX
C Diff antigen: NEGATIVE
C Diff interpretation: NOT DETECTED
C Diff toxin: NEGATIVE

## 2020-02-12 LAB — AMYLASE: Amylase: 116 U/L — ABNORMAL HIGH (ref 28–100)

## 2020-02-12 LAB — RESPIRATORY PANEL BY RT PCR (FLU A&B, COVID)
Influenza A by PCR: NEGATIVE
Influenza B by PCR: NEGATIVE
SARS Coronavirus 2 by RT PCR: NEGATIVE

## 2020-02-12 LAB — LACTIC ACID, PLASMA: Lactic Acid, Venous: 1.6 mmol/L (ref 0.5–1.9)

## 2020-02-12 LAB — BRAIN NATRIURETIC PEPTIDE: B Natriuretic Peptide: 65 pg/mL (ref 0.0–100.0)

## 2020-02-12 LAB — TROPONIN I (HIGH SENSITIVITY)
Troponin I (High Sensitivity): 35 ng/L — ABNORMAL HIGH (ref <18)
Troponin I (High Sensitivity): 34 ng/L — ABNORMAL HIGH (ref <18)

## 2020-02-12 LAB — MAGNESIUM: Magnesium: 1.6 mg/dL — ABNORMAL LOW (ref 1.7–2.4)

## 2020-02-12 LAB — LIPASE, BLOOD: Lipase: 63 U/L — ABNORMAL HIGH (ref 11–51)

## 2020-02-12 MED ORDER — POTASSIUM CHLORIDE 10 MEQ/100ML IV SOLN
10.0000 meq | INTRAVENOUS | Status: DC
  Administered 2020-02-12: 10 meq via INTRAVENOUS
  Filled 2020-02-12: qty 100

## 2020-02-12 MED ORDER — ALLOPURINOL 300 MG PO TABS
300.0000 mg | ORAL_TABLET | Freq: Every day | ORAL | Status: DC
  Administered 2020-02-13 – 2020-02-18 (×4): 300 mg via ORAL
  Filled 2020-02-12 (×4): qty 1

## 2020-02-12 MED ORDER — LACTATED RINGERS IV BOLUS
500.0000 mL | Freq: Once | INTRAVENOUS | Status: AC
  Administered 2020-02-12: 500 mL via INTRAVENOUS

## 2020-02-12 MED ORDER — SULFASALAZINE 500 MG PO TABS
500.0000 mg | ORAL_TABLET | Freq: Every day | ORAL | Status: DC
  Administered 2020-02-13: 500 mg via ORAL
  Filled 2020-02-12: qty 1

## 2020-02-12 MED ORDER — LACTATED RINGERS IV BOLUS: 1000.0000 mL | Freq: Once | INTRAVENOUS | Status: DC

## 2020-02-12 MED ORDER — INSULIN GLARGINE 100 UNIT/ML ~~LOC~~ SOLN
20.0000 [IU] | Freq: Every day | SUBCUTANEOUS | Status: DC
  Administered 2020-02-13: 20 [IU] via SUBCUTANEOUS
  Filled 2020-02-12 (×2): qty 0.2

## 2020-02-12 MED ORDER — SPIRONOLACTONE 12.5 MG HALF TABLET
12.5000 mg | ORAL_TABLET | Freq: Every day | ORAL | Status: DC
  Filled 2020-02-12: qty 1

## 2020-02-12 MED ORDER — TAMSULOSIN HCL 0.4 MG PO CAPS
0.4000 mg | ORAL_CAPSULE | Freq: Every day | ORAL | Status: DC
  Administered 2020-02-13 – 2020-02-15 (×2): 0.4 mg via ORAL
  Filled 2020-02-12 (×2): qty 1

## 2020-02-12 MED ORDER — ONDANSETRON 4 MG PO TBDP
4.0000 mg | ORAL_TABLET | Freq: Three times a day (TID) | ORAL | Status: DC | PRN
  Administered 2020-02-13: 4 mg via ORAL
  Filled 2020-02-12 (×2): qty 1

## 2020-02-12 MED ORDER — POTASSIUM CHLORIDE CRYS ER 20 MEQ PO TBCR
40.0000 meq | EXTENDED_RELEASE_TABLET | Freq: Once | ORAL | Status: AC
  Administered 2020-02-12: 40 meq via ORAL
  Filled 2020-02-12: qty 2

## 2020-02-12 MED ORDER — SACUBITRIL-VALSARTAN 24-26 MG PO TABS
1.0000 | ORAL_TABLET | Freq: Two times a day (BID) | ORAL | Status: DC
  Filled 2020-02-12: qty 1

## 2020-02-12 MED ORDER — LOPERAMIDE HCL 2 MG PO CAPS
4.0000 mg | ORAL_CAPSULE | Freq: Once | ORAL | Status: AC
  Administered 2020-02-12: 4 mg via ORAL
  Filled 2020-02-12: qty 2

## 2020-02-12 MED ORDER — HYDROXYUREA 500 MG PO CAPS
1000.0000 mg | ORAL_CAPSULE | Freq: Two times a day (BID) | ORAL | Status: DC
  Administered 2020-02-13 – 2020-02-14 (×2): 1000 mg via ORAL
  Filled 2020-02-12 (×5): qty 2

## 2020-02-12 MED ORDER — CARVEDILOL 12.5 MG PO TABS
12.5000 mg | ORAL_TABLET | Freq: Two times a day (BID) | ORAL | Status: DC
  Administered 2020-02-13 – 2020-02-14 (×3): 12.5 mg via ORAL
  Filled 2020-02-12 (×3): qty 1

## 2020-02-12 MED ORDER — LACTATED RINGERS IV SOLN: INTRAVENOUS | Status: DC

## 2020-02-12 MED ORDER — ROSUVASTATIN CALCIUM 5 MG PO TABS
5.0000 mg | ORAL_TABLET | Freq: Every day | ORAL | Status: DC
  Administered 2020-02-13 – 2020-02-18 (×4): 5 mg via ORAL
  Filled 2020-02-12 (×4): qty 1

## 2020-02-12 MED ORDER — FEBUXOSTAT 40 MG PO TABS
80.0000 mg | ORAL_TABLET | Freq: Every day | ORAL | Status: DC
  Filled 2020-02-12: qty 2

## 2020-02-12 MED ORDER — POTASSIUM CHLORIDE CRYS ER 20 MEQ PO TBCR: 40.0000 meq | EXTENDED_RELEASE_TABLET | Freq: Once | ORAL | Status: DC

## 2020-02-12 MED ORDER — CITALOPRAM HYDROBROMIDE 10 MG PO TABS
5.0000 mg | ORAL_TABLET | Freq: Every day | ORAL | Status: DC
  Administered 2020-02-13 – 2020-02-18 (×4): 5 mg via ORAL
  Filled 2020-02-12 (×6): qty 1

## 2020-02-12 MED ORDER — PANTOPRAZOLE SODIUM 40 MG PO TBEC
40.0000 mg | DELAYED_RELEASE_TABLET | Freq: Every day | ORAL | Status: DC
  Administered 2020-02-13: 40 mg via ORAL
  Filled 2020-02-12: qty 1

## 2020-02-12 MED ORDER — OXYBUTYNIN CHLORIDE ER 10 MG PO TB24
10.0000 mg | ORAL_TABLET | Freq: Every day | ORAL | Status: DC
  Administered 2020-02-13 – 2020-02-18 (×4): 10 mg via ORAL
  Filled 2020-02-12 (×6): qty 1

## 2020-02-12 MED ORDER — METHOTREXATE 2.5 MG PO TABS: 2.5000 mg | ORAL_TABLET | Freq: Every morning | ORAL | Status: DC

## 2020-02-12 MED ORDER — ASPIRIN 81 MG PO CHEW
81.0000 mg | CHEWABLE_TABLET | Freq: Every day | ORAL | Status: DC
  Administered 2020-02-13: 81 mg via ORAL
  Filled 2020-02-12: qty 1

## 2020-02-12 MED ORDER — HEPARIN SODIUM (PORCINE) 5000 UNIT/ML IJ SOLN
5000.0000 [IU] | Freq: Three times a day (TID) | INTRAMUSCULAR | Status: DC
  Administered 2020-02-13 – 2020-02-14 (×4): 5000 [IU] via SUBCUTANEOUS
  Filled 2020-02-12 (×4): qty 1

## 2020-02-12 NOTE — ED Provider Notes (Signed)
[facility] Provider Note   CSN: [id number] Arrival date & time: 02/12/20  1810     History Chief Complaint  Patient presents with  . Abdominal Pain  . Nausea    William Villanueva is a 69 y.o. male with past medical history of polycythemia vera, hemochromatosis, nonischemic cardiomyopathy, CHF EF 35% status post CRT-D implant, who presents to the ED for abdominal pain, nausea and vomiting for the last 3 weeks.  Patient was seen at [facility] 8 days ago and had an extensive work-up.  Lipase 160 and CT abdomen pelvis negative for pancreatitis.  Troponin flat 26-27.  CBC shows leukocytosis of 10.9.  CMP shows elevated creatinine at 1.56 which likely prerenal.  BMP and UA were unremarkable.  Covid was negative.  Patient was diagnosed with viral enteritis and DC with p.o. ondansetron which patient states that did not provide much relief.  His wife, patient has not been eating or drinking much the last few days.  He also has diarrhea and weakness.  He also reports 5-6 loose stools episode a day.  Patient denies more than usual chest pain or shortness of breath, lower extremity edema or headache.  HPI     Past Medical History:  Diagnosis Date  . AV block, 2nd degree 2015   St. Jude Allure Quadra pulse generator [id number], model PM 3242  . Back pain   . Cardiomyopathy ([place])    Nonischemic 45%.   . CHF (congestive heart failure) ([place])   . Gout   . Hemochromatosis   . Hypertension   . Hypospadias 06/16/1950   born with  . Nephrolithiasis   . Polycythemia vera(238.4)     Patient Active Problem List   Diagnosis Date Noted  . Diarrhea 02/12/2020  . Generalized abdominal pain 02/12/2020  . Sprain of left wrist 08/12/2019  . CHF (congestive heart failure) ([place]) 07/17/2017  . COPD (chronic obstructive pulmonary disease) ([place]) 06/14/2017  . Acute renal failure (ARF) ([place]) 05/27/2016  . Hyponatremia 05/27/2016  . Hypokalemia 05/27/2016  .  AKI (acute kidney injury) ([place]) 05/26/2016  . Depression 04/05/2016  . Kidney stones 04/05/2016  . Osteoarthritis 04/05/2016  . Sleep apnea 04/05/2016  . Bilateral carpal tunnel syndrome 12/02/2015  . Chronic combined systolic and diastolic congestive heart failure ([place]) 04/13/2015  . Orthostatic hypotension 01/19/2015  . Obesity 11/26/2014  . Enlarged prostate without lower urinary tract symptoms (luts) 10/29/2014  . Essential hypertension 10/29/2014  . GERD (gastroesophageal reflux disease) 10/29/2014  . Panic attack 10/29/2014  . Changing skin lesion 10/16/2014  . Acute on chronic combined systolic and diastolic congestive heart failure ([place]) 10/14/2014  . Renal cyst 09/02/2014  . Pacemaker lead malfunction-elevated threshold RV lead 07/31/2014  . Dyspnea on exertion 06/21/2014  . Gastroenteritis, acute 06/15/2014  . 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 Palpitations 06/11/2014  . 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  . Other cardiomyopathies ([place]) 04/29/2014  . Bradycardia 04/28/2014  . Chronic pain 02/12/2014  . 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 Muscular wasting and disuse atrophy 08/15/2013  . Lumbar and sacral osteoarthritis 05/19/2013  . Congestive dilated cardiomyopathy ([place]) 01/11/2012  . Compulsive tobacco user syndrome 12/13/2011  . Current tobacco use 12/13/2011  . Chest tightness   . Rash   . Back pain   . Hemochromatosis   . SOB (shortness of breath)   . Gout   . Polycythemia vera ([place]) 03/27/2011    Past Surgical History:  Procedure Laterality Date  . ANKLE SURGERY    .  BI-VENTRICULAR PACEMAKER INSERTION N/A 04/29/2014   Procedure: BI-VENTRICULAR PACEMAKER INSERTION (CRT-P);  Surgeon: Deboraha Sprang, MD; Laterality: Left  St. Jude Allure Quadra pulse generator [id number], model PM [phone]  . PILONIDAL CYST EXCISION    . POSTERIOR LAMINECTOMY / DECOMPRESSION LUMBAR SPINE    . TONSILLECTOMY         Family History  Problem Relation Age of Onset  . Heart  disease Maternal Grandmother        Pacemaker, MI  . Stroke Mother   . Other Father        Deceased, car fell on him  . Diabetes Sister   . Hypertension Sister     Social History   Tobacco Use  . Smoking status: Former Smoker    Packs/day: 1.00    Years: 44.00    Pack years: 44.00    Types: Cigarettes    Start date: 06/05/1968    Quit date: 04/05/2013    Years since quitting: 6.8  . Smokeless tobacco: Never Used  . Tobacco comment: quit 2 years ago  Vaping Use  . Vaping Use: Never used  Substance Use Topics  . Alcohol use: Yes    Alcohol/week: 0.0 standard drinks    Comment: rare  . Drug use: No    Home Medications Prior to Admission medications   Medication Sig Start Date End Date Taking? Authorizing Provider  allopurinol (ZYLOPRIM) 300 MG tablet Take 300 mg by mouth daily. 06/30/19   [provider]  aspirin 81 MG tablet Take 81 mg by mouth daily.    [provider]  BD PEN NEEDLE NANO U/F 32G X 4 MM MISC  06/16/18   [provider]  carvedilol (COREG) 12.5 MG tablet Take 1 tablet (12.5 mg total) by mouth 2 (two) times daily with a meal. 10/14/19   Crenshaw, Denice Bors, MD  citalopram (CELEXA) 10 MG tablet Take 5 mg by mouth daily.  01/27/16   [provider]  ENTRESTO 24-26 MG TAKE ONE TABLET BY MOUTH TWICE A DAY 07/09/19   Lelon Perla, MD  febuxostat (ULORIC) 40 MG tablet Take 80 mg by mouth daily.    [provider]  folic acid (FOLVITE) 1 MG tablet Take 1 mg by mouth daily.    [provider]  furosemide (LASIX) 40 MG tablet Take 1 tablet (40 mg total) by mouth daily. 01/02/20 04/01/20  Patsey Berthold, NP  hydroxyurea (HYDREA) 500 MG capsule TAKE TWO CAPSULES BY MOUTH DAILY 09/09/19   Volanda Napoleon, MD  insulin degludec (TRESIBA FLEXTOUCH) 100 UNIT/ML SOPN FlexTouch Pen Inject 20 Units into the skin daily.    [provider]  methotrexate 2.5 MG tablet Take by mouth. 11/07/19   [provider]    omeprazole (PRILOSEC) 20 MG capsule Take 20 mg by mouth 2 (two) times daily before a meal.  07/05/16   [provider]  ondansetron (ZOFRAN-ODT) 4 MG disintegrating tablet Take 4 mg by mouth every 8 (eight) hours as needed for nausea or vomiting.    [provider]  oxybutynin (DITROPAN-XL) 10 MG 24 hr tablet Take 10 mg by mouth daily. 10/02/19   [provider]  rosuvastatin (CRESTOR) 5 MG tablet Take 5 mg by mouth daily.    [provider]  spironolactone (ALDACTONE) 25 MG tablet Take 0.5 tablets (12.5 mg total) by mouth daily. 10/14/19   Lelon Perla, MD  sulfaSALAzine (AZULFIDINE) 500 MG tablet Take by mouth. 11/05/19   [provider]  tamsulosin (FLOMAX) 0.4 MG CAPS capsule Take 0.4 mg by mouth. 08/03/17   [provider]    Allergies    Bupropion, Fluoxetine, Ibuprofen, Prednisone, Temazepam, Trazodone and nefazodone, Cephalexin, Fluoxetine hcl, and Prozac [fluoxetine hcl]  Review of Systems   Review of Systems  Constitutional: Positive for appetite change.  HENT: Positive for sinus pressure.   Respiratory: Positive for cough. Negative for chest tightness and shortness of breath.   Gastrointestinal: Positive for abdominal distention, abdominal pain, diarrhea, nausea and vomiting.    Physical Exam Updated Vital Signs BP 104/74   Pulse 84   Resp 11   Ht 5' 10"  (1.778 m)   Wt 128.1 kg   SpO2 94%   BMI 40.53 kg/m   Physical Exam Constitutional:      Appearance: He is obese. He is not toxic-appearing.     Comments: Somnolent  HENT:     Head: Normocephalic.     Mouth/Throat:     Comments: Dry Eyes:     General:        Right eye: No discharge.        Left eye: No discharge.  Cardiovascular:     Rate and Rhythm: Normal rate.  Pulmonary:     Breath sounds: Normal breath sounds.  Abdominal:     General: Bowel sounds are normal. There is distension.     Tenderness: There is abdominal tenderness. There is no guarding.      Hernia: No hernia is present.     Comments: Mild tenderness to palpation at right lower quadrant.  Negative Murphy sign  Skin:    Coloration: Skin is not jaundiced.     ED Results / Procedures / Treatments   Labs (all labs ordered are listed, but only abnormal results are displayed) Labs Reviewed  CBC WITH DIFFERENTIAL/PLATELET - Abnormal; Notable for the following components:      Result Value   WBC 11.0 (*)    RBC 3.93 (*)    MCV 104.3 (*)    MCH 34.9 (*)    RDW 18.3 (*)    Platelets 913 (*)    Neutro Abs 8.9 (*)    Lymphs Abs 0.3 (*)    Eosinophils Absolute 0.9 (*)    Basophils Absolute 0.4 (*)    All other components within normal limits  COMPREHENSIVE METABOLIC PANEL - Abnormal; Notable for the following components:   Sodium 133 (*)    Potassium 3.1 (*)    CO2 16 (*)    Glucose, Bld 126 (*)    BUN 61 (*)    Creatinine, Ser 2.12 (*)    Calcium 8.4 (*)    Total Protein 6.3 (*)    Albumin 3.4 (*)    GFR calc non Af Amer 31 (*)    All other components within normal limits  LIPASE, BLOOD - Abnormal; Notable for the following components:   Lipase 63 (*)    All other components within normal limits  AMYLASE - Abnormal; Notable for the following components:   Amylase 116 (*)    All other components within normal limits  TROPONIN I (HIGH SENSITIVITY) - Abnormal; Notable for the following components:   Troponin I (High Sensitivity) 35 (*)    All other components within normal limits  C DIFFICILE QUICK SCREEN W PCR REFLEX  GASTROINTESTINAL PANEL BY PCR, STOOL (REPLACES STOOL CULTURE)  RESPIRATORY PANEL BY RT PCR (FLU A&B, COVID)  BRAIN NATRIURETIC PEPTIDE  LACTIC ACID, PLASMA  LACTIC  ACID, PLASMA  PATHOLOGIST SMEAR REVIEW  MAGNESIUM  TROPONIN I (HIGH SENSITIVITY)    EKG None  Radiology CT ABDOMEN PELVIS WO CONTRAST  Result Date: 02/12/2020 CLINICAL DATA:  Abdominal pain EXAM: CT ABDOMEN AND PELVIS WITHOUT CONTRAST TECHNIQUE: Multidetector CT imaging of  the abdomen and pelvis was performed following the standard protocol without IV contrast. COMPARISON:  CT 02/04/2020 FINDINGS: Lower chest: Lung bases demonstrate no acute consolidation or effusion. Partially visualized intracardiac pacing leads. Hepatobiliary: No focal liver abnormality is seen. No gallstones, gallbladder wall thickening, or biliary dilatation. Pancreas: Unremarkable. No pancreatic ductal dilatation or surrounding inflammatory changes. Spleen: Enlarged with craniocaudal measurement of 15 cm. Adrenals/Urinary Tract: Adrenal glands are normal. No hydronephrosis. Low-density renal lesions likely cysts. Bladder is normal Stomach/Bowel: Stomach is within normal limits. Appendix not well seen but no right lower quadrant inflammatory process. Left colon diverticula without acute inflammatory change. No evidence of bowel wall thickening, distention, or inflammatory changes. Vascular/Lymphatic: Mild to moderate aortic atherosclerosis. No aneurysm. No suspicious adenopathy. Reproductive: Prostate calcification. Other: Negative for free air or free fluid. Musculoskeletal: Degenerative changes. No acute or suspicious osseous abnormality. IMPRESSION: 1. No CT evidence for acute intra-abdominal or pelvic abnormality. 2. Splenomegaly. 3. 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 Aortic Atherosclerosis (ICD10-I70.0). Electronically Signed   By: Donavan Foil M.D.   On: 02/12/2020 21:10    Procedures Procedures (including critical care time)  Medications Ordered in ED Medications  loperamide (IMODIUM) capsule 4 mg (has no administration in time range)  potassium chloride SA (KLOR-CON) CR tablet 40 mEq (has no administration in time range)  lactated ringers bolus 500 mL (500 mLs Intravenous New Bag/Given 02/12/20 1900)    ED Course  I have reviewed the triage vital signs and the nursing notes.  Pertinent labs & imaging results that were available during my care of the patient  were reviewed by me and considered in my medical decision making (see chart for details).  Patient seen and examined.  Chief complaint is nausea, vomiting and decreased p.o. intake, which led to his weakness.  Differentials include acute pancreatitis versus viral gastroenteritis.  Low suspicion for pancreatitis or diverticulitis.  Blood pressure 97/60 in the room.  Give 500 cc bolus of LR.  Obtain CBC, CMP, lipase, lactic acid, amylase, BMP, troponin and EKG. if lipase is elevated will repeat CT abdomen pelvis.  Also check for C. Difficile and stool panel.  BP 104/74   Pulse 84   Resp 11   Ht 5' 10"  (1.778 m)   Wt 128.1 kg   SpO2 94%   BMI 40.53 kg/m   CBC showing leukocytosis of 11 with thrombocytosis platelets level 913, likely reactive and dehydration.  Patient does have history of polycythemia vera and did not receive phlebotomy treatment on 01/29/2018.  CMP shows an AKI with creatinine of 2.12 and BUN 61.  Sodium 133 and potassium 3.1, repleted.  Troponin 35.  Lipase elevated at 63 and amylase 116.  CT abdomen pelvis without contrast is negative for any acute abnormality.  Patient will be admitted for management of his symptoms and AKI.  Pending C. difficile and GI panel.   MDM Rules/Calculators/A&P                          Patient presents to ED for 3 weeks of abdominal pain, nausea and vomiting.  He was previously seen at [facility] a week prior and had a negative CT  abdomen pelvis.  Patient continue to have nausea, rate and decrease p.o. intake.  Repeat CT abdomen pelvis without contrast due to elevated lipase and amylase, which is also negative for any acute intra-abdominal abnormality.  Due to his AKI and ongoing symptoms, patient will be admitted for further management.  Pending C. Difficile and GI panel.  Pending second troponin level.   Final Clinical Impression(s) / ED Diagnoses Final diagnoses:  Gastroenteritis, acute  Hypokalemia  AKI (acute kidney injury) ([place])    Diarrhea, unspecified type  Generalized abdominal pain    Rx / DC Orders ED Discharge Orders    None       Gaylan Gerold, DO 02/12/20 [id number]    Quintella Reichert, MD 02/13/20 2356

## 2020-02-12 NOTE — Progress Notes (Signed)
Received report from ED RN. Room ready for patient.

## 2020-02-12 NOTE — ED Triage Notes (Signed)
Pt BIB [facility] from home c/o ongoing abdominal pain x3 weeks worsening today along with nausea, vomiting, and weakness. Pt recently seen at [facility] dx with stomach virus/gastroenteritis on 9/29. Sent Home with [facility] zofran.   On EMS arrival pt BP 78/50 given 400 mL fluid bolus repeat BP 120/62.

## 2020-02-12 NOTE — ED Notes (Signed)
Date and time results received: 02/12/20 1940 (use smartphrase ".now" to insert current time)  Test: Platelets Critical Value: 913  Name of Provider Notified: Alfonse Spruce, DO  Orders Received? Or Actions Taken?:

## 2020-02-12 NOTE — H&P (Signed)
History and Physical   Lyan Moyano NID:[id number] DOB: Nov 06, 1950 DOA: 02/12/2020  PCP: Antony Contras, MD  Patient coming from: home  I have personally briefly reviewed patient's old medical records in [place].  Chief Concern: N/V/D  HPI: Muhammed Teutsch is a 69 y.o. male with medical history of polycythemia vera, hemochromatosis, Mobitz type II and complete aterioventricular heart block and nonischemic cardiomyopathy with EF 35% s/p CRT-D implant, history of norvovirus and c. Diff infection in 05/27/2016, who has been having nausea, vomiting, diarrhea x 3 weeks. He was seen at [facility] ED on 02/04/20 for gastroenteritis.   During evaluation, Mr. Mcgrory reports that he has been having at least 6-8 episodes of watery bowel movements, nausea, and some vomiting. He reports that he has decreased appetite and poor PO intake. He endorses baseline shortness of breath. He denies chest pain, fever, chills, headache, vision changes. He denies urinary complaints. He denies hematochezia and melena.   He reports no changes to diet, recent travel (out-of-state or international), no exposure to pets. He reports no close family members having similar symptoms. He lives at home with his wife who is his primary caregiver. Patient and spouse denies changes to medication in the last 3-4 weeks.  Abnormal labs were, WBC 11, plt is 913, Na 13, K 3.1, CO2 16, BUN 61, Cr 2.12 (baseline 1.2).  ED Course: Discussed with ED provider. Worked up for pancreatitis was negative with amylase minimally elevated at 116, lipase 60. CT ab/pel without contrast was negative for acute intra-abdominal or pelvic abnormality. He was found to have AKI and submitted for admission. Vitals were stable in the ED.   Review of Systems: As per HPI otherwise 10 point review of systems negative.  Past Medical History:  Diagnosis Date  . AV block, 2nd degree 2015   St. Jude Allure Quadra pulse generator [id number], model PM 3242  . Back  pain   . Cardiomyopathy ([place])    Nonischemic 45%.   . CHF (congestive heart failure) ([place])   . Gout   . Hemochromatosis   . Hypertension   . Hypospadias 05-Aug-1950   born with  . Nephrolithiasis   . 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 ANKLE SURGERY    . BI-VENTRICULAR PACEMAKER INSERTION N/A 04/29/2014   Procedure: BI-VENTRICULAR PACEMAKER INSERTION (CRT-P);  Surgeon: Deboraha Sprang, MD; Laterality: Left  St. Jude Allure Quadra pulse generator [phone], model PM [id number]  . PILONIDAL CYST EXCISION    . POSTERIOR LAMINECTOMY / DECOMPRESSION LUMBAR SPINE    . TONSILLECTOMY     Social History:  reports that he quit smoking about 6 years ago. His smoking use included cigarettes. He started smoking about 51 years ago. He has a 44.00 pack-year smoking history. He has never used smokeless tobacco. He reports current alcohol use. He reports that he does not use drugs.  Allergies  Allergen Reactions  . Bupropion Hives  . Cephalexin Diarrhea and Other (See Comments)    Caused C-diff, also   . Fluoxetine Itching  . Ibuprofen Itching  . Prednisone Itching and Other (See Comments)    Abdominal pain, also   . Temazepam Other (See Comments)    Dizziness   . Trazodone And Nefazodone Other (See Comments)    Dizziness  . Fluoxetine Hcl Itching  . Prozac [Fluoxetine Hcl] Itching   Family History  Problem Relation Age of Onset  . Heart disease Maternal Grandmother  Pacemaker, MI  . Stroke Mother   . Other Father        Deceased, car fell on him  . Diabetes Sister   . Hypertension Sister    Family history: Family history reviewed and not pertinent  Prior to Admission medications   Medication Sig Start Date End Date Taking? Authorizing Provider  allopurinol (ZYLOPRIM) 300 MG tablet Take 300 mg by mouth daily. 06/30/19   [provider]  aspirin 81 MG tablet Take 81 mg by mouth daily.    [provider]  BD PEN NEEDLE  NANO U/F 32G X 4 MM MISC  06/16/18   [provider]  carvedilol (COREG) 12.5 MG tablet Take 1 tablet (12.5 mg total) by mouth 2 (two) times daily with a meal. 10/14/19   Crenshaw, Denice Bors, MD  citalopram (CELEXA) 10 MG tablet Take 5 mg by mouth daily.  01/27/16   [provider]  ENTRESTO 24-26 MG TAKE ONE TABLET BY MOUTH TWICE A DAY 07/09/19   Lelon Perla, MD  febuxostat (ULORIC) 40 MG tablet Take 80 mg by mouth daily.    [provider]  folic acid (FOLVITE) 1 MG tablet Take 1 mg by mouth daily.    [provider]  furosemide (LASIX) 40 MG tablet Take 1 tablet (40 mg total) by mouth daily. 01/02/20 04/01/20  Patsey Berthold, NP  hydroxyurea (HYDREA) 500 MG capsule TAKE TWO CAPSULES BY MOUTH DAILY 09/09/19   Volanda Napoleon, MD  insulin degludec (TRESIBA FLEXTOUCH) 100 UNIT/ML SOPN FlexTouch Pen Inject 20 Units into the skin daily.    [provider]  methotrexate 2.5 MG tablet Take by mouth. 11/07/19   [provider]  omeprazole (PRILOSEC) 20 MG capsule Take 20 mg by mouth 2 (two) times daily before a meal.  07/05/16   [provider]  ondansetron (ZOFRAN-ODT) 4 MG disintegrating tablet Take 4 mg by mouth every 8 (eight) hours as needed for nausea or vomiting.    [provider]  oxybutynin (DITROPAN-XL) 10 MG 24 hr tablet Take 10 mg by mouth daily. 10/02/19   [provider]  rosuvastatin (CRESTOR) 5 MG tablet Take 5 mg by mouth daily.    [provider]  spironolactone (ALDACTONE) 25 MG tablet Take 0.5 tablets (12.5 mg total) by mouth daily. 10/14/19   Lelon Perla, MD  sulfaSALAzine (AZULFIDINE) 500 MG tablet Take by mouth. 11/05/19   [provider]  tamsulosin (FLOMAX) 0.4 MG CAPS capsule Take 0.4 mg by mouth. 08/03/17   [provider]   Physical Exam: Vitals:   02/12/20 2225 02/12/20 2300 02/12/20 2351 02/12/20 2354  BP: (!) 106/54 118/63  (!) 105/45  Pulse:  80  86  Resp: 18 16  16    Temp:    97.9 F (36.6 C)  TempSrc:    Oral  SpO2:  94%  96%  Weight:   126.6 kg   Height:   5' 10"  (1.778 m)    Constitutional: NAD, calm, comfortable Eyes: PERRL, lids and conjunctivae normal ENMT: Mucous membranes are moist. Posterior pharynx clear of any exudate or lesions. Age appropriate dentition.  Neck: normal, supple, no masses, no thyromegaly Respiratory: clear to auscultation bilaterally, no wheezing, no crackles. Normal respiratory effort. No accessory muscle use.  Cardiovascular: Regular rate and rhythm, no murmurs / rubs / gallops. No extremity edema. 2+ pedal pulses. No carotid bruits.  Abdomen: obese abdomen, no masses palpated. Minimal bowel sounds. Tenderness to palpation. Musculoskeletal:  no clubbing / cyanosis. No joint deformity upper and lower extremities. Good ROM, no contractures. Normal muscle tone.  Skin: no rashes, lesions, ulcers. No induration Neurologic: CN 2-12 grossly intact. Sensation intact, DTR normal. Strength 5/5 in all 4.  Psychiatric: Normal judgment and insight. Alert and oriented x 3. Normal mood.   Labs on Admission: I have personally reviewed following labs and imaging studies  CBC: Recent Labs  Lab 02/12/20 1841 02/12/20 2313  WBC 11.0* 11.3*  NEUTROABS 8.9*  --   HGB 13.7 13.8  HCT 41.0 41.7  MCV 104.3* 104.3*  PLT 913* 030*   Basic Metabolic Panel: Recent Labs  Lab 02/12/20 1841 02/12/20 2118 02/12/20 2313  NA 133*  --   --   K 3.1*  --   --   CL 105  --   --   CO2 16*  --   --   GLUCOSE 126*  --   --   BUN 61*  --   --   CREATININE 2.12*  --  2.15*  CALCIUM 8.4*  --   --   MG  --  1.6*  --    GFR: Estimated Creatinine Clearance: 43.9 mL/min (A) (by C-G formula based on SCr of 2.15 mg/dL (H)). Liver Function Tests: Recent Labs  Lab 02/12/20 1841  AST 18  ALT 7  ALKPHOS 64  BILITOT 0.3  PROT 6.3*  ALBUMIN 3.4*   Recent Labs  Lab 02/12/20 1841  LIPASE 63*  AMYLASE 116*   No results for input(s): AMMONIA  in the last 168 hours. Coagulation Profile: No results for input(s): INR, PROTIME in the last 168 hours. Cardiac Enzymes: No results for input(s): CKTOTAL, CKMB, CKMBINDEX, TROPONINI in the last 168 hours. BNP (last 3 results) Recent Labs    01/02/20 0859  PROBNP 2,894*   Urine analysis:    Component Value Date/Time   COLORURINE YELLOW 02/04/2020 2158   APPEARANCEUR CLEAR 02/04/2020 2158   LABSPEC <1.005 (L) 02/04/2020 2158   LABSPEC 1.020 10/08/2015 0903   PHURINE 5.5 02/04/2020 2158   GLUCOSEU NEGATIVE 02/04/2020 2158   HGBUR NEGATIVE 02/04/2020 2158   BILIRUBINUR NEGATIVE 02/04/2020 2158   KETONESUR NEGATIVE 02/04/2020 2158   PROTEINUR NEGATIVE 02/04/2020 2158   UROBILINOGEN 0.2 10/08/2015 0903   NITRITE NEGATIVE 02/04/2020 2158   LEUKOCYTESUR NEGATIVE 02/04/2020 2158   Radiological Exams on Admission: Personally reviewed and I agree with radiologist reading as below.  CT ABDOMEN PELVIS WO CONTRAST  Result Date: 02/12/2020 CLINICAL DATA:  Abdominal pain EXAM: CT ABDOMEN AND PELVIS WITHOUT CONTRAST TECHNIQUE: Multidetector CT imaging of the abdomen and pelvis was performed following the standard protocol without IV contrast. COMPARISON:  CT 02/04/2020 FINDINGS: Lower chest: Lung bases demonstrate no acute consolidation or effusion. Partially visualized intracardiac pacing leads. Hepatobiliary: No focal liver abnormality is seen. No gallstones, gallbladder wall thickening, or biliary dilatation. Pancreas: Unremarkable. No pancreatic ductal dilatation or surrounding inflammatory changes. Spleen: Enlarged with craniocaudal measurement of 15 cm. Adrenals/Urinary Tract: Adrenal glands are normal. No hydronephrosis. Low-density renal lesions likely cysts. Bladder is normal Stomach/Bowel: Stomach is within normal limits. Appendix not well seen but no right lower quadrant inflammatory process. Left colon diverticula without acute inflammatory change. No evidence of bowel wall thickening,  distention, or inflammatory changes. Vascular/Lymphatic: Mild to moderate aortic atherosclerosis. No aneurysm. No suspicious adenopathy. Reproductive: Prostate calcification. Other: Negative for free air or free fluid. Musculoskeletal: Degenerative changes. No acute or suspicious osseous abnormality. IMPRESSION: 1. No CT evidence for acute intra-abdominal  or pelvic abnormality. 2. Splenomegaly. 3. 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 Aortic Atherosclerosis (ICD10-I70.0). Electronically Signed   By: Donavan Foil M.D.   On: 02/12/2020 21:10   Assessment/Plan Principal Problem:   Gastroenteritis, acute Active Problems:   Polycythemia vera (HCC)   Back pain   AKI (acute kidney injury) ([place])   Hypokalemia   Diarrhea   Generalized abdominal pain   Thrombocytosis   Mr. Nash is a 69 year old with medical history of polycythemia vera, hemochromatosis, Mobitz type II and complete aterioventricular heart block and nonischemic cardiomyopathy with EF 35% s/p CRT-D implant, history of norvovirus and c. Diff infection in 05/27/2016, who has been having nausea, vomiting, diarrhea x 3 weeks.   # Gastroenteritis with persistent diarrhea  - CT ab/pel w/o contrast was read as no CT evidence for acute intra-abdominal or pelvic abnormality; left colon diverticular disease w/o acute inflammatory change; splenomegaly  - C diff quick screen is negative  - GI panel pending and if negative, he may benefit from GI input - Imodium 4 mg given in ED - Imodium 2 mg prn for two doses ordered  # Polycythemia vera  - elevated plt of 913 - suspect hemoconcentration vs poor medication penetration from GI loss  - LR 125 ml/hr IVF - CBC in the AM  # AKI on Ckd 3 - likely pre-renal from GI loss - LR at 125 cc/hr IVF - BMP in the AM   # Metabolic acidosis - likely secondary to diarrhea - BMP in the AM - Imodium - No indications for bicarb supplementation at this time   DVT prophylaxis:  heparin TID subc Code Status: FULL Family Communication: discussed with spouse at bedside Disposition Plan: pending clinical course Consults called: not at this time Admission status: inpatient  Brionne Mertz N Mckenna Boruff D.O. [facility]  If 7AM-7PM, please contact day-coverage [email or web address]  02/13/2020, 3:30 AM

## 2020-02-13 ENCOUNTER — Telehealth: Payer: Self-pay | Admitting: *Deleted

## 2020-02-13 ENCOUNTER — Encounter (HOSPITAL_COMMUNITY): Payer: Self-pay | Admitting: Internal Medicine

## 2020-02-13 LAB — GASTROINTESTINAL PANEL BY PCR, STOOL (REPLACES STOOL CULTURE)

## 2020-02-13 LAB — GLUCOSE, CAPILLARY
Glucose-Capillary: 119 mg/dL — ABNORMAL HIGH (ref 70–99)
Glucose-Capillary: 126 mg/dL — ABNORMAL HIGH (ref 70–99)
Glucose-Capillary: 151 mg/dL — ABNORMAL HIGH (ref 70–99)
Glucose-Capillary: 128 mg/dL — ABNORMAL HIGH (ref 70–99)

## 2020-02-13 LAB — CBC
HCT: 40.4 % (ref 39.0–52.0)
Hemoglobin: 13.7 g/dL (ref 13.0–17.0)
MCH: 35.8 pg — ABNORMAL HIGH (ref 26.0–34.0)
MCHC: 33.9 g/dL (ref 30.0–36.0)
MCV: 105.5 fL — ABNORMAL HIGH (ref 80.0–100.0)
Platelets: 812 10*3/uL — ABNORMAL HIGH (ref 150–400)
RBC: 3.83 MIL/uL — ABNORMAL LOW (ref 4.22–5.81)
RDW: 18.6 % — ABNORMAL HIGH (ref 11.5–15.5)
WBC: 10.8 10*3/uL — ABNORMAL HIGH (ref 4.0–10.5)
nRBC: 0.2 % (ref 0.0–0.2)

## 2020-02-13 LAB — COMPREHENSIVE METABOLIC PANEL
ALT: 11 U/L (ref 0–44)
AST: 21 U/L (ref 15–41)
Albumin: 3.2 g/dL — ABNORMAL LOW (ref 3.5–5.0)
Alkaline Phosphatase: 61 U/L (ref 38–126)
Anion gap: 16 — ABNORMAL HIGH (ref 5–15)
BUN: 60 mg/dL — ABNORMAL HIGH (ref 8–23)
CO2: 12 mmol/L — ABNORMAL LOW (ref 22–32)
Calcium: 8.6 mg/dL — ABNORMAL LOW (ref 8.9–10.3)
Chloride: 105 mmol/L (ref 98–111)
Creatinine, Ser: 2.09 mg/dL — ABNORMAL HIGH (ref 0.61–1.24)
GFR calc non Af Amer: 32 mL/min — ABNORMAL LOW (ref >60)
Glucose, Bld: 97 mg/dL (ref 70–99)
Potassium: 3.6 mmol/L (ref 3.5–5.1)
Sodium: 133 mmol/L — ABNORMAL LOW (ref 135–145)
Total Bilirubin: 0.7 mg/dL (ref 0.3–1.2)
Total Protein: 6.3 g/dL — ABNORMAL LOW (ref 6.5–8.1)

## 2020-02-13 LAB — LACTIC ACID, PLASMA: Lactic Acid, Venous: 1.1 mmol/L (ref 0.5–1.9)

## 2020-02-13 LAB — HIV ANTIBODY (ROUTINE TESTING W REFLEX): HIV Screen 4th Generation wRfx: NONREACTIVE

## 2020-02-13 MED ORDER — POTASSIUM CHLORIDE CRYS ER 20 MEQ PO TBCR
20.0000 meq | EXTENDED_RELEASE_TABLET | Freq: Once | ORAL | Status: AC
  Administered 2020-02-13: 20 meq via ORAL
  Filled 2020-02-13: qty 1

## 2020-02-13 MED ORDER — SODIUM BICARBONATE 8.4 % IV SOLN
INTRAVENOUS | Status: DC
  Filled 2020-02-13: qty 150
  Filled 2020-02-13 (×3): qty 850

## 2020-02-13 MED ORDER — INSULIN ASPART 100 UNIT/ML ~~LOC~~ SOLN
0.0000 [IU] | Freq: Three times a day (TID) | SUBCUTANEOUS | Status: DC
  Administered 2020-02-13: 2 [IU] via SUBCUTANEOUS
  Administered 2020-02-13 – 2020-02-14 (×2): 3 [IU] via SUBCUTANEOUS

## 2020-02-13 MED ORDER — SODIUM BICARBONATE 8.4 % IV SOLN: INTRAVENOUS | Status: DC

## 2020-02-13 MED ORDER — BUDESONIDE 3 MG PO CPEP
9.0000 mg | ORAL_CAPSULE | Freq: Every day | ORAL | Status: DC
  Administered 2020-02-13 – 2020-02-18 (×4): 9 mg via ORAL
  Filled 2020-02-13 (×6): qty 3

## 2020-02-13 MED ORDER — ADULT MULTIVITAMIN W/MINERALS CH
1.0000 | ORAL_TABLET | Freq: Every day | ORAL | Status: DC
  Administered 2020-02-13 – 2020-02-18 (×4): 1 via ORAL
  Filled 2020-02-13 (×4): qty 1

## 2020-02-13 MED ORDER — LOPERAMIDE HCL 2 MG PO CAPS
2.0000 mg | ORAL_CAPSULE | ORAL | Status: AC | PRN
  Administered 2020-02-13 – 2020-02-16 (×2): 2 mg via ORAL
  Filled 2020-02-13 (×2): qty 1

## 2020-02-13 MED ORDER — SULFASALAZINE 500 MG PO TBEC
2000.0000 mg | DELAYED_RELEASE_TABLET | Freq: Two times a day (BID) | ORAL | Status: DC
  Administered 2020-02-13 – 2020-02-18 (×7): 2000 mg via ORAL
  Filled 2020-02-13 (×12): qty 4

## 2020-02-13 MED ORDER — MAGNESIUM SULFATE 2 GM/50ML IV SOLN
2.0000 g | Freq: Once | INTRAVENOUS | Status: AC
  Administered 2020-02-13: 2 g via INTRAVENOUS
  Filled 2020-02-13: qty 50

## 2020-02-13 MED ORDER — ENSURE ENLIVE PO LIQD
237.0000 mL | Freq: Three times a day (TID) | ORAL | Status: DC
  Administered 2020-02-13 – 2020-02-18 (×3): 237 mL via ORAL

## 2020-02-13 MED ORDER — SODIUM BICARBONATE 8.4 % IV SOLN
INTRAVENOUS | Status: DC
  Filled 2020-02-13 (×2): qty 850

## 2020-02-13 NOTE — Telephone Encounter (Signed)
Call received from patient's wife to inform Dr. Marin Olp that patient has been admitted to the 5th floor on [place].  Dr. Marin Olp notified.

## 2020-02-13 NOTE — Progress Notes (Signed)
CRITICAL VALUE ALERT  Critical Value:  Platelet=913  Date & Time Notied:  02/12/20 @23 :44  Provider Notified: Eusebio Friendly 02/13/20 @00 :39  Orders Received/Actions taken: No orders received. Will continue to monitor.

## 2020-02-13 NOTE — Progress Notes (Signed)
Initial Nutrition Assessment  DOCUMENTATION CODES:   Morbid obesity  INTERVENTION:  Ensure Enlive po TID, each supplement provides 350 kcal and 20 grams of protein  MVI daily   NUTRITION DIAGNOSIS:   Inadequate oral intake related to poor appetite, nausea, vomiting as evidenced by per patient/family report.    GOAL:   Patient will meet greater than or equal to 90% of their needs    MONITOR:   PO intake, Supplement acceptance, Labs, I & O's, Weight trends  REASON FOR ASSESSMENT:   Malnutrition Screening Tool    ASSESSMENT:   Pt admitted for gastroenteritis with persistent diarrhea. PMH includes polycythemia vera, hemochromatosis, HTN, cardiomyopathy, CHF.   Pt unavailable at time of RD visit. Per H&P, pt endorses 3 week hx of poor appetite 2/2 N/V.   Reviewed wt history, pt noted to have had a gradual wt loss over the last 6 months.   PO Intake: 30-50% x 2 recorded meals  Labs: Na 133 (L), CBGs 119-126 Medications: novolog, lantus, protonix   Diet Order:   Diet Order            Diet heart healthy/carb modified Room service appropriate? Yes; Fluid consistency: Thin  Diet effective now                 EDUCATION NEEDS:   No education needs have been identified at this time  Skin:  Skin Assessment: Skin Integrity Issues: Skin Integrity Issues:: Other (Comment) Other: MASD groin  Last BM:  10/7 type 7  Height:   Ht Readings from Last 1 Encounters:  02/12/20 5' 10"  (1.778 m)    Weight:   Wt Readings from Last 1 Encounters:  02/12/20 126.6 kg    Ideal Body Weight:  75.45 kg  BMI:  Body mass index is 40.05 kg/m.  Estimated Nutritional Needs:   Kcal:  2400-2600  Protein:  160-180 grams  Fluid:  >2L    Larkin Ina, MS, RD, LDN RD pager number and weekend/on-call pager number located in [place].

## 2020-02-13 NOTE — Consult Note (Signed)
Referring Provider: Dr. Glynis Smiles Primary Care Physician:  Antony Contras, MD Primary Gastroenterologist:  Dr. Watt Climes [facility])  Reason for Consultation:  Diarrhea  HPI: William Villanueva is a 69 y.o. male with history of ulcerative colitis, type II DM, polycythemia vera, hemochromatosis, nonischemic cardiomyopathy s/p s/p CRT-D, CHF EF 30-35% as of 01/2020 presenting for consultation of diarrhea.  Patient has a history of ulcerative colitis, diagnosed in 2018.  He was initially on Apriso but had recurrence of symptoms in early 2020 and was transitioned to sulfasalazine.  His symptoms have been well controlled on sulfasalazine 2000 mg 3 times daily.  However, in early September, he ran out of his medication and there was a delay in his refills.  Since that time, his symptoms have progressively worsened.  He states he has been having a bowel movement approximately every hour for at least 12 liquid  bowel movements daily.  Was not having nocturnal diarrhea at home but states he has had some nocturnal diarrhea during his hospitalization.  Denies any melena or hematochezia.  Notes generalized abdominal discomfort.  Also reports nausea with intermittent vomiting.  Denies any hematemesis.  reports a history of GERD, for which he takes omeprazole 20 mg BID at home.  Reports recent intermittent dysphagia to solids.  Patient also reports approximately 25 pound weight loss in the last month.  His appetite has been decreased with his recent symptoms.  Takes 81 mg aspirin daily but denies blood thinner use.  CT 02/12/2020 with no evidence for acute intra-abdominal abnormality.  Diverticular disease without inflammation present.  Last colonoscopy was in 11/2017 and revealed one 42mm polyp in the cecum, diverticulosis, and medium internal hemorrhoids.  Colonoscopy prior to that was completed in 06/2016 eroded mucosa in the rectum, sigmoid, descending, and transverse colon (biopsy showed moderately active chronic  colitis negative for dysplasia, supporting chronic ulcerative colitis).  Diverticulosis was also present.  Past Medical History:  Diagnosis Date  . AV block, 2nd degree 2015   [employer] Allure Quadra pulse generator [id number], model PM 3242  . Back pain   . Cardiomyopathy ([place])    Nonischemic 45%.   . CHF (congestive heart failure) ([place])   . Gout   . Hemochromatosis   . Hypertension   . Hypospadias August 16, 1950   born with  . Nephrolithiasis   . Polycythemia vera(238.4)     Past Surgical History:  Procedure Laterality Date  . ANKLE SURGERY    . BI-VENTRICULAR PACEMAKER INSERTION N/A 04/29/2014   Procedure: BI-VENTRICULAR PACEMAKER INSERTION (CRT-P);  Surgeon: Deboraha Sprang, MD; Laterality: Left  St. Jude Allure Quadra pulse generator [id number], model PM [id number]  . PILONIDAL CYST EXCISION    . POSTERIOR LAMINECTOMY / DECOMPRESSION LUMBAR SPINE    . TONSILLECTOMY      Prior to Admission medications   Medication Sig Start Date End Date Taking? Authorizing Provider  allopurinol (ZYLOPRIM) 300 MG tablet Take 300 mg by mouth daily. 06/30/19  Yes [provider]  aspirin 81 MG tablet Take 81 mg by mouth at bedtime.    Yes [provider]  carvedilol (COREG) 12.5 MG tablet Take 1 tablet (12.5 mg total) by mouth 2 (two) times daily with a meal. 10/14/19  Yes Crenshaw, Denice Bors, MD  citalopram (CELEXA) 10 MG tablet Take 5 mg by mouth daily.  01/27/16  Yes [provider]  ENTRESTO 24-26 MG TAKE ONE TABLET BY MOUTH TWICE A DAY Patient taking differently: Take 1 tablet by mouth 2 (two) times  daily.  07/09/19  Yes Crenshaw, Denice Bors, MD  folic acid (FOLVITE) 1 MG tablet Take 1 mg by mouth daily.   Yes [provider]  hydroxyurea (HYDREA) 500 MG capsule TAKE TWO CAPSULES BY MOUTH DAILY Patient taking differently: Take 500 mg by mouth in the morning and at bedtime.  09/09/19  Yes Ennever, Rudell Cobb, MD  insulin degludec (TRESIBA FLEXTOUCH) 100 UNIT/ML SOPN FlexTouch Pen  Inject 20 Units into the skin daily after breakfast.    Yes [provider]  methotrexate 2.5 MG tablet Take 12.5 mg by mouth See admin instructions. Take 12.5 mg by mouth in the morning and 12.5 mg in the evening ONLY ON TUESDAYS 11/07/19  Yes [provider]  omeprazole (PRILOSEC) 20 MG capsule Take 20 mg by mouth 2 (two) times daily before a meal.  07/05/16  Yes [provider]  oxybutynin (DITROPAN-XL) 10 MG 24 hr tablet Take 10 mg by mouth daily. 10/02/19  Yes [provider]  rosuvastatin (CRESTOR) 5 MG tablet Take 5 mg by mouth daily.   Yes [provider]  spironolactone (ALDACTONE) 25 MG tablet Take 0.5 tablets (12.5 mg total) by mouth daily. 10/14/19  Yes Lelon Perla, MD  sulfaSALAzine (AZULFIDINE) 500 MG tablet Take 2,000 mg by mouth in the morning and at bedtime.  11/05/19  Yes [provider]  tamsulosin (FLOMAX) 0.4 MG CAPS capsule Take 0.4 mg by mouth 2 (two) times daily.  08/03/17  Yes [provider]  BD PEN NEEDLE NANO U/F 32G X 4 MM MISC  06/16/18   [provider]  febuxostat (ULORIC) 40 MG tablet Take 80 mg by mouth daily. Patient not taking: Reported on 02/12/2020    [provider]  furosemide (LASIX) 40 MG tablet Take 1 tablet (40 mg total) by mouth daily. 01/02/20 04/01/20  Chanetta Marshall K, NP  ondansetron (ZOFRAN-ODT) 4 MG disintegrating tablet Take 4 mg by mouth every 8 (eight) hours as needed for nausea or vomiting. Patient not taking: Reported on 02/12/2020    [provider]    Scheduled Meds: . allopurinol  300 mg Oral Daily  . aspirin  81 mg Oral Daily  . carvedilol  12.5 mg Oral BID WC  . citalopram  5 mg Oral Daily  . heparin  5,000 Units Subcutaneous Q8H  . hydroxyurea  1,000 mg Oral BID WC  . insulin aspart  0-15 Units Subcutaneous TID AC & HS  . insulin glargine  20 Units Subcutaneous Daily  . oxybutynin  10 mg Oral Daily  . pantoprazole  40 mg Oral Daily  . rosuvastatin   5 mg Oral Daily  . sulfaSALAzine  500 mg Oral Daily  . tamsulosin  0.4 mg Oral QPC supper   Continuous Infusions: . magnesium sulfate bolus IVPB 2 g (02/13/20 1215)  . sodium bicarbonate (isotonic) 150 mEq in D5W 1000 mL infusion     PRN Meds:.loperamide, ondansetron  Allergies as of 02/12/2020 - Review Complete 02/12/2020  Allergen Reaction Noted  . Bupropion Hives 03/18/2014  . Cephalexin Diarrhea and Other (See Comments) 11/12/2016  . Fluoxetine Itching 02/15/2016  . Ibuprofen Itching 10/16/2014  . Prednisone Itching and Other (See Comments) 04/28/2014  . Temazepam Other (See Comments) 07/15/2015  . Trazodone and nefazodone Other (See Comments) 07/15/2015  . Fluoxetine hcl Itching 02/02/2016  . Prozac [fluoxetine hcl] Itching 02/02/2016    Family History  Problem Relation Age of Onset  . Heart disease Maternal Grandmother  Pacemaker, MI  . Stroke Mother   . Other Father        Deceased, car fell on him  . Diabetes Sister   . Hypertension Sister     Social History   Socioeconomic History  . Marital status: Married    Spouse name: Not on file  . Number of children: Not on file  . Years of education: Not on file  . Highest education level: Not on file  Occupational History  . Not on file  Tobacco Use  . Smoking status: Former Smoker    Packs/day: 1.00    Years: 44.00    Pack years: 44.00    Types: Cigarettes    Start date: 06/05/1968    Quit date: 04/05/2013    Years since quitting: 6.8  . Smokeless tobacco: Never Used  . Tobacco comment: quit in 2014  Vaping Use  . Vaping Use: Never used  Substance and Sexual Activity  . Alcohol use: Yes    Alcohol/week: 0.0 standard drinks    Comment: rare  . Drug use: No  . Sexual activity: Not Currently  Other Topics Concern  . Not on file  Social History Narrative   Lives at home with wife in a one story home.  Has no children.  Does not work.  Getting workman's comp.  Education: 4 years trade school.     Social Determinants of Health   Financial Resource Strain:   . Difficulty of Paying Living Expenses: Not on file  Food Insecurity:   . Worried About Charity fundraiser in the Last Year: Not on file  . Ran Out of Food in the Last Year: Not on file  Transportation Needs:   . Lack of Transportation (Medical): Not on file  . Lack of Transportation (Non-Medical): Not on file  Physical Activity:   . Days of Exercise per Week: Not on file  . Minutes of Exercise per Session: Not on file  Stress:   . Feeling of Stress : Not on file  Social Connections:   . Frequency of Communication with Friends and Family: Not on file  . Frequency of Social Gatherings with Friends and Family: Not on file  . Attends Religious Services: Not on file  . Active Member of Clubs or Organizations: Not on file  . Attends Archivist Meetings: Not on file  . Marital Status: Not on file  Intimate Partner Violence:   . Fear of Current or Ex-Partner: Not on file  . Emotionally Abused: Not on file  . Physically Abused: Not on file  . Sexually Abused: Not on file    Review of Systems: Review of Systems  Constitutional: Positive for weight loss. Negative for chills and fever.  HENT: Negative for hearing loss and tinnitus.   Eyes: Negative for pain and redness.  Respiratory: Negative for cough and shortness of breath.   Cardiovascular: Negative for chest pain and palpitations.  Gastrointestinal: Positive for abdominal pain, diarrhea, heartburn, nausea and vomiting. Negative for blood in stool, constipation and melena.  Genitourinary: Negative for flank pain and hematuria.  Musculoskeletal: Negative for falls and joint pain.  Skin: Negative for itching and rash.  Neurological: Negative for seizures and loss of consciousness.  Endo/Heme/Allergies: Negative for polydipsia. Does not bruise/bleed easily.  Psychiatric/Behavioral: Negative for substance abuse. The patient is not nervous/anxious.      Physical  Exam: Vital signs: Vitals:   02/13/20 0406 02/13/20 1000  BP: 107/63 (!) 103/43  Pulse: 85 89  Resp: 18 16  Temp: 98.9 F (37.2 C) 97.6 F (36.4 C)  SpO2: 92% 93%   Last BM Date: 02/12/20  Physical Exam Vitals reviewed.  Constitutional:      General: He is not in acute distress. HENT:     Head: Normocephalic and atraumatic.     Nose: Nose normal.     Mouth/Throat:     Mouth: Mucous membranes are moist.     Pharynx: Oropharynx is clear.  Eyes:     General: No scleral icterus.    Extraocular Movements: Extraocular movements intact.     Conjunctiva/sclera: Conjunctivae normal.  Cardiovascular:     Rate and Rhythm: Normal rate and regular rhythm.     Pulses: Normal pulses.     Heart sounds: Normal heart sounds.  Pulmonary:     Effort: Pulmonary effort is normal. No respiratory distress.     Breath sounds: Normal breath sounds.  Abdominal:     General: Bowel sounds are normal. There is no distension.     Palpations: Abdomen is soft. There is no mass.     Tenderness: There is abdominal tenderness (LLQ, RLQ). There is no guarding or rebound.     Hernia: No hernia is present.  Musculoskeletal:        General: No swelling or tenderness.     Cervical back: Normal range of motion and neck supple.  Skin:    General: Skin is warm and dry.  Neurological:     General: No focal deficit present.     Mental Status: He is oriented to person, place, and time. He is lethargic.  Psychiatric:        Mood and Affect: Mood normal.        Behavior: Behavior normal. Behavior is cooperative.    GI:  Lab Results: Recent Labs    02/12/20 1841 02/12/20 2313 02/13/20 0320  WBC 11.0* 11.3* 10.8*  HGB 13.7 13.8 13.7  HCT 41.0 41.7 40.4  PLT 913* 913* 812*   BMET Recent Labs    02/12/20 1841 02/12/20 2313 02/13/20 0320  NA 133*  --  133*  K 3.1*  --  3.6  CL 105  --  105  CO2 16*  --  12*  GLUCOSE 126*  --  97  BUN 61*  --  60*  CREATININE 2.12* 2.15* 2.09*  CALCIUM 8.4*   --  8.6*   LFT Recent Labs    02/13/20 0320  PROT 6.3*  ALBUMIN 3.2*  AST 21  ALT 11  ALKPHOS 61  BILITOT 0.7   PT/INR No results for input(s): LABPROT, INR in the last 72 hours.   Studies/Results: CT ABDOMEN PELVIS WO CONTRAST  Result Date: 02/12/2020 CLINICAL DATA:  Abdominal pain EXAM: CT ABDOMEN AND PELVIS WITHOUT CONTRAST TECHNIQUE: Multidetector CT imaging of the abdomen and pelvis was performed following the standard protocol without IV contrast. COMPARISON:  CT 02/04/2020 FINDINGS: Lower chest: Lung bases demonstrate no acute consolidation or effusion. Partially visualized intracardiac pacing leads. Hepatobiliary: No focal liver abnormality is seen. No gallstones, gallbladder wall thickening, or biliary dilatation. Pancreas: Unremarkable. No pancreatic ductal dilatation or surrounding inflammatory changes. Spleen: Enlarged with craniocaudal measurement of 15 cm. Adrenals/Urinary Tract: Adrenal glands are normal. No hydronephrosis. Low-density renal lesions likely cysts. Bladder is normal Stomach/Bowel: Stomach is within normal limits. Appendix not well seen but no right lower quadrant inflammatory process. Left colon diverticula without acute inflammatory change. No evidence of bowel wall thickening, distention, or inflammatory changes. Vascular/Lymphatic: Mild  to moderate aortic atherosclerosis. No aneurysm. No suspicious adenopathy. Reproductive: Prostate calcification. Other: Negative for free air or free fluid. Musculoskeletal: Degenerative changes. No acute or suspicious osseous abnormality. IMPRESSION: 1. No CT evidence for acute intra-abdominal or pelvic abnormality. 2. Splenomegaly. 3. 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 Aortic Atherosclerosis (ICD10-I70.0). Electronically Signed   By: Donavan Foil M.D.   On: 02/12/2020 21:10    Impression: Diarrhea: Suspect worsening of ulcerative colitis as patient ran out of sulfasalazine. -Negative C diff  and GI pathogen panel -Hemoglobin 13.7, mildly decreased from baseline, though no signs of GI bleeding.  Decrease most likely dilutional.   Acute on chronic kidney disease: BUN 60/ Cr 2.09  Thrombocytosis: platelets 812 K/iL  DM type II  GERD: on 20 mg omeprazole BID at home, Protonix 40 mg qd currently  Plan: Increase sulfasalazine to home dose of 2000 mg (4 tabs) BID.  Start Budesonide 9 mg daily.  Patient has allergy to prednisone (itching) and has DM type 2, thus we will start with Entocort instead of Methylprednisolone or prednisone.  Do not suspect patient will have reaction/itching with budesonide due to its low systemic bioavailability.  Continue Imodium as needed (4 mg after first loose stool, then 2mg  tab after each loose stool, max 16 mg/day).  Continue supportive care and IVF.  [facility] will follow.   LOS: 1 day   Salley Slaughter  PA-C 02/13/2020, 12:31 PM  Contact #  [phone]

## 2020-02-13 NOTE — Progress Notes (Signed)
PROGRESS NOTE    Rastus Borton  SKA:[id number] DOB: 1950/11/18 DOA: 02/12/2020 PCP: Antony Contras, MD   Brief Narrative: 69 year old with past medical history significant for polycythemia vera, hemochromatosis, Mobitz type II: Complete atrioventricular heart block and nonischemic cardiomyopathy with ejection fraction 35% status post CRT D implant, history of norovirus and CDF infection 2018 who presents complaining of nausea vomiting and diarrhea for 3 weeks. She reports 6-8 episodes of watery stool. Decreased appetite and poor oral intake.  Patient admitted with AKI nausea vomiting and diarrhea and metabolic acidosis.   Assessment & Plan:   Principal Problem:   Gastroenteritis, acute Active Problems:   Polycythemia vera (HCC)   Back pain   AKI (acute kidney injury) ([place])   Hypokalemia   Diarrhea   Generalized abdominal pain   Thrombocytosis   1-Diarrhea: History of ulcerative colitis -CT abdomen and pelvis without contrast without any evidence of acute intra-abdominal or pelvic abnormality. -C. difficile negative -GI pathogen pending -Report that his diarrhea started after he ran out of sulfasalazine. -GI consulted  2-Metabolic acidosis: AKI: Start bicarb drip Bicarb down to 12.  3-AKI; on CKD stage IIIa: Prior creatinine 1.2--1.4 Likely secondary to prerenal from GI loss. Continue with IV fluids. Hold Entresto,. Spironolactone  4-polycythemia vera Worsening increased platelet count could be reactive. Dr. Marin Olp informed of patient admission Continue with Hydrea.  Nonischemic cardiomyopathy status post CRT D Ejection fraction 30 to 35% Hold Entresto, spironolactone and diuretics For pulmonary edema.  Hold the methotrexate  Nutrition Problem: Inadequate oral intake Etiology: poor appetite, nausea, vomiting    Signs/Symptoms: per patient/family report    Interventions: Ensure Enlive (each supplement provides 350kcal and 20 grams of protein),  MVI  Estimated body mass index is 40.05 kg/m as calculated from the following:   Height as of this encounter: 5' 10"  (1.778 m).   Weight as of this encounter: 126.6 kg.   DVT prophylaxis: Heparin Code Status: Full code Family Communication: Wife who was at bedside Disposition Plan:  Status is: Inpatient  Remains inpatient appropriate because:Hemodynamically unstable   Dispo: The patient is from: Home              Anticipated d/c is to: Home              Anticipated d/c date is: 2 days              Patient currently is not medically stable to d/c.        Consultants:   GI  Procedures:   None  Antimicrobials:    Subjective: He continues to report multiple bowel movement watery  Objective: Vitals:   02/12/20 2300 02/12/20 2351 02/12/20 2354 02/13/20 0406  BP: 118/63  (!) 105/45 107/63  Pulse: 80  86 85  Resp: 16  16 18   Temp:   97.9 F (36.6 C) 98.9 F (37.2 C)  TempSrc:   Oral Oral  SpO2: 94%  96% 92%  Weight:  126.6 kg    Height:  5' 10"  (1.778 m)      Intake/Output Summary (Last 24 hours) at 02/13/2020 0758 Last data filed at 02/13/2020 2035 Gross per 24 hour  Intake 1734.16 ml  Output 3 ml  Net 1731.16 ml   Filed Weights   02/12/20 1817 02/12/20 2351  Weight: 128.1 kg 126.6 kg    Examination:  General exam: Appears calm and comfortable , obese Respiratory system: Clear to auscultation. Respiratory effort normal. Cardiovascular system: S1 & S2 heard, RRR. No JVD, murmurs,  rubs, gallops or clicks. No pedal edema. Gastrointestinal system: Abdomen is nondistended, soft and nontender. No organomegaly or masses felt. Normal bowel sounds heard. Central nervous system: Alert and oriented. No focal neurological deficits. Extremities: Symmetric 5 x 5 power.   Data Reviewed: I have personally reviewed following labs and imaging studies  CBC: Recent Labs  Lab 02/12/20 1841 02/12/20 2313 02/13/20 0320  WBC 11.0* 11.3* 10.8*  NEUTROABS 8.9*  --    --   HGB 13.7 13.8 13.7  HCT 41.0 41.7 40.4  MCV 104.3* 104.3* 105.5*  PLT 913* 913* 563*   Basic Metabolic Panel: Recent Labs  Lab 02/12/20 1841 02/12/20 2118 02/12/20 2313 02/13/20 0320  NA 133*  --   --  133*  K 3.1*  --   --  3.6  CL 105  --   --  105  CO2 16*  --   --  12*  GLUCOSE 126*  --   --  97  BUN 61*  --   --  60*  CREATININE 2.12*  --  2.15* 2.09*  CALCIUM 8.4*  --   --  8.6*  MG  --  1.6*  --   --    GFR: Estimated Creatinine Clearance: 45.2 mL/min (A) (by C-G formula based on SCr of 2.09 mg/dL (H)). Liver Function Tests: Recent Labs  Lab 02/12/20 1841 02/13/20 0320  AST 18 21  ALT 7 11  ALKPHOS 64 61  BILITOT 0.3 0.7  PROT 6.3* 6.3*  ALBUMIN 3.4* 3.2*   Recent Labs  Lab 02/12/20 1841  LIPASE 63*  AMYLASE 116*   No results for input(s): AMMONIA in the last 168 hours. Coagulation Profile: No results for input(s): INR, PROTIME in the last 168 hours. Cardiac Enzymes: No results for input(s): CKTOTAL, CKMB, CKMBINDEX, TROPONINI in the last 168 hours. BNP (last 3 results) Recent Labs    01/02/20 0859  PROBNP 2,894*   HbA1C: No results for input(s): HGBA1C in the last 72 hours. CBG: Recent Labs  Lab 02/13/20 0655  GLUCAP 119*   Lipid Profile: No results for input(s): CHOL, HDL, LDLCALC, TRIG, CHOLHDL, LDLDIRECT in the last 72 hours. Thyroid Function Tests: No results for input(s): TSH, T4TOTAL, FREET4, T3FREE, THYROIDAB in the last 72 hours. Anemia Panel: No results for input(s): VITAMINB12, FOLATE, FERRITIN, TIBC, IRON, RETICCTPCT in the last 72 hours. Sepsis Labs: Recent Labs  Lab 02/12/20 1922 02/13/20 0320  LATICACIDVEN 1.6 1.1    Recent Results (from the past 240 hour(s))  Respiratory Panel by RT PCR (Flu A&B, Covid) - Nasopharyngeal Swab     Status: None   Collection Time: 02/04/20  2:36 PM   Specimen: Nasopharyngeal Swab  Result Value Ref Range Status   SARS Coronavirus 2 by RT PCR NEGATIVE NEGATIVE Final    Comment:  (NOTE) SARS-CoV-2 target nucleic acids are NOT DETECTED.  The SARS-CoV-2 RNA is generally detectable in upper respiratoy specimens during the acute phase of infection. The lowest concentration of SARS-CoV-2 viral copies this assay can detect is 131 copies/mL. A negative result does not preclude SARS-Cov-2 infection and should not be used as the sole basis for treatment or other patient management decisions. A negative result may occur with  improper specimen collection/handling, submission of specimen other than nasopharyngeal swab, presence of viral mutation(s) within the areas targeted by this assay, and inadequate number of viral copies (<131 copies/mL). A negative result must be combined with clinical observations, patient history, and epidemiological information. The expected result is Negative.  Fact Sheet for Patients:  PinkCheek.be  Fact Sheet for Healthcare Providers:  GravelBags.it  This test is no t yet approved or cleared by the [place] FDA and  has been authorized for detection and/or diagnosis of SARS-CoV-2 by FDA under an Emergency Use Authorization (EUA). This EUA will remain  in effect (meaning this test can be used) for the duration of the COVID-19 declaration under Section 564(b)(1) of the Act, 21 U.S.C. section 360bbb-3(b)(1), unless the authorization is terminated or revoked sooner.     Influenza A by PCR NEGATIVE NEGATIVE Final   Influenza B by PCR NEGATIVE NEGATIVE Final    Comment: (NOTE) The Xpert Xpress SARS-CoV-2/FLU/RSV assay is intended as an aid in  the diagnosis of influenza from Nasopharyngeal swab specimens and  should not be used as a sole basis for treatment. Nasal washings and  aspirates are unacceptable for Xpert Xpress SARS-CoV-2/FLU/RSV  testing.  Fact Sheet for Patients: PinkCheek.be  Fact Sheet for Healthcare  Providers: GravelBags.it  This test is not yet approved or cleared by the [place] FDA and  has been authorized for detection and/or diagnosis of SARS-CoV-2 by  FDA under an Emergency Use Authorization (EUA). This EUA will remain  in effect (meaning this test can be used) for the duration of the  Covid-19 declaration under Section 564(b)(1) of the Act, 21  U.S.C. section 360bbb-3(b)(1), unless the authorization is  terminated or revoked. Performed at [facility], [address] [place], [place] [address]   C Difficile Quick Screen w PCR reflex     Status: None   Collection Time: 02/12/20  7:23 PM   Specimen: Stool  Result Value Ref Range Status   C Diff antigen NEGATIVE NEGATIVE Final   C Diff toxin NEGATIVE NEGATIVE Final   C Diff interpretation No C. difficile detected.  Final    Comment: Performed at [facility], [place] [address] [place], [place] [address]  Respiratory Panel by RT PCR (Flu A&B, Covid) - Nasopharyngeal Swab     Status: None   Collection Time: 02/12/20 10:24 PM   Specimen: Nasopharyngeal Swab  Result Value Ref Range Status   SARS Coronavirus 2 by RT PCR NEGATIVE NEGATIVE Final    Comment: (NOTE) SARS-CoV-2 target nucleic acids are NOT DETECTED.  The SARS-CoV-2 RNA is generally detectable in upper respiratoy specimens during the acute phase of infection. The lowest concentration of SARS-CoV-2 viral copies this assay can detect is 131 copies/mL. A negative result does not preclude SARS-Cov-2 infection and should not be used as the sole basis for treatment or other patient management decisions. A negative result may occur with  improper specimen collection/handling, submission of specimen other than nasopharyngeal swab, presence of viral mutation(s) within the areas targeted by this assay, and inadequate number of viral copies (<131 copies/mL). A negative result must be combined with clinical observations,  patient history, and epidemiological information. The expected result is Negative.  Fact Sheet for Patients:  PinkCheek.be  Fact Sheet for Healthcare Providers:  GravelBags.it  This test is no t yet approved or cleared by the [place] FDA and  has been authorized for detection and/or diagnosis of SARS-CoV-2 by FDA under an Emergency Use Authorization (EUA). This EUA will remain  in effect (meaning this test can be used) for the duration of the COVID-19 declaration under Section 564(b)(1) of the Act, 21 U.S.C. section 360bbb-3(b)(1), unless the authorization is terminated or revoked sooner.     Influenza A by PCR  NEGATIVE NEGATIVE Final   Influenza B by PCR NEGATIVE NEGATIVE Final    Comment: (NOTE) The Xpert Xpress SARS-CoV-2/FLU/RSV assay is intended as an aid in  the diagnosis of influenza from Nasopharyngeal swab specimens and  should not be used as a sole basis for treatment. Nasal washings and  aspirates are unacceptable for Xpert Xpress SARS-CoV-2/FLU/RSV  testing.  Fact Sheet for Patients: PinkCheek.be  Fact Sheet for Healthcare Providers: GravelBags.it  This test is not yet approved or cleared by the [place] FDA and  has been authorized for detection and/or diagnosis of SARS-CoV-2 by  FDA under an Emergency Use Authorization (EUA). This EUA will remain  in effect (meaning this test can be used) for the duration of the  Covid-19 declaration under Section 564(b)(1) of the Act, 21  U.S.C. section 360bbb-3(b)(1), unless the authorization is  terminated or revoked. Performed at [facility], [place] [address] [place], [place] [address]          Radiology Studies: CT ABDOMEN PELVIS WO CONTRAST  Result Date: 02/12/2020 CLINICAL DATA:  Abdominal pain EXAM: CT ABDOMEN AND PELVIS WITHOUT CONTRAST TECHNIQUE: Multidetector CT imaging of the  abdomen and pelvis was performed following the standard protocol without IV contrast. COMPARISON:  CT 02/04/2020 FINDINGS: Lower chest: Lung bases demonstrate no acute consolidation or effusion. Partially visualized intracardiac pacing leads. Hepatobiliary: No focal liver abnormality is seen. No gallstones, gallbladder wall thickening, or biliary dilatation. Pancreas: Unremarkable. No pancreatic ductal dilatation or surrounding inflammatory changes. Spleen: Enlarged with craniocaudal measurement of 15 cm. Adrenals/Urinary Tract: Adrenal glands are normal. No hydronephrosis. Low-density renal lesions likely cysts. Bladder is normal Stomach/Bowel: Stomach is within normal limits. Appendix not well seen but no right lower quadrant inflammatory process. Left colon diverticula without acute inflammatory change. No evidence of bowel wall thickening, distention, or inflammatory changes. Vascular/Lymphatic: Mild to moderate aortic atherosclerosis. No aneurysm. No suspicious adenopathy. Reproductive: Prostate calcification. Other: Negative for free air or free fluid. Musculoskeletal: Degenerative changes. No acute or suspicious osseous abnormality. IMPRESSION: 1. No CT evidence for acute intra-abdominal or pelvic abnormality. 2. Splenomegaly. 3. 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 Aortic Atherosclerosis (ICD10-I70.0). Electronically Signed   By: Donavan Foil M.D.   On: 02/12/2020 21:10        Scheduled Meds: . allopurinol  300 mg Oral Daily  . aspirin  81 mg Oral Daily  . carvedilol  12.5 mg Oral BID WC  . citalopram  5 mg Oral Daily  . febuxostat  80 mg Oral Daily  . heparin  5,000 Units Subcutaneous Q8H  . hydroxyurea  1,000 mg Oral BID WC  . insulin aspart  0-15 Units Subcutaneous TID AC & HS  . insulin glargine  20 Units Subcutaneous Daily  . methotrexate  2.5 mg Oral q morning - 10a  . oxybutynin  10 mg Oral Daily  . pantoprazole  40 mg Oral Daily  . rosuvastatin  5 mg  Oral Daily  . sulfaSALAzine  500 mg Oral Daily  . tamsulosin  0.4 mg Oral QPC supper   Continuous Infusions: . lactated ringers 125 mL/hr at 02/12/20 2235     LOS: 1 day    Time spent: 35 minutes    Sunjai Levandoski A Keimari , MD [facility]   If 7PM-7AM, please contact night-coverage [email or web address]  02/13/2020, 7:58 AM

## 2020-02-13 NOTE — Plan of Care (Signed)
  Problem: Education: Goal: Knowledge of General Education information will improve Description: Including pain rating scale, medication(s)/side effects and non-pharmacologic comfort measures Outcome: Completed/Met

## 2020-02-13 NOTE — Progress Notes (Signed)
New Admission Note:   Arrival Method: Arrived from [facility] ED via stretcher Mental Orientation: Alert and oriented x4 Telemetry: Box #11 Assessment: Completed Skin: MASD on both groin and bilateral abdominal folds,excoriations on both buttocks. IV: LR @ 125cc/hr-Rt Hand Pain: Denies Tubes: N/A Safety Measures: Safety Fall Prevention Plan has been discussed.  Admission: Completed 5MW Orientation: Patient has been orientated to the room, unit and staff.  Family: None at bedside  Orders have been reviewed and implemented. Will continue to monitor the patient. Call light has been placed within reach and bed alarm has been activated.   Gini Caputo [employer], RN-BC Phone number: [phone]

## 2020-02-14 ENCOUNTER — Inpatient Hospital Stay (HOSPITAL_COMMUNITY): Payer: Medicare Other

## 2020-02-14 DIAGNOSIS — K529 Noninfective gastroenteritis and colitis, unspecified: Secondary | ICD-10-CM

## 2020-02-14 DIAGNOSIS — D62 Acute posthemorrhagic anemia: Secondary | ICD-10-CM | POA: Diagnosis not present

## 2020-02-14 DIAGNOSIS — R55 Syncope and collapse: Secondary | ICD-10-CM | POA: Diagnosis not present

## 2020-02-14 DIAGNOSIS — R578 Other shock: Secondary | ICD-10-CM

## 2020-02-14 LAB — TROPONIN I (HIGH SENSITIVITY)
Troponin I (High Sensitivity): 37 ng/L — ABNORMAL HIGH (ref <18)
Troponin I (High Sensitivity): 37 ng/L — ABNORMAL HIGH (ref <18)

## 2020-02-14 LAB — LACTIC ACID, PLASMA
Lactic Acid, Venous: 1.5 mmol/L (ref 0.5–1.9)
Lactic Acid, Venous: 1.8 mmol/L (ref 0.5–1.9)

## 2020-02-14 LAB — CBC
HCT: 32 % — ABNORMAL LOW (ref 39.0–52.0)
HCT: 21.6 % — ABNORMAL LOW (ref 39.0–52.0)
HCT: 33 % — ABNORMAL LOW (ref 39.0–52.0)
Hemoglobin: 10.5 g/dL — ABNORMAL LOW (ref 13.0–17.0)
Hemoglobin: 7 g/dL — ABNORMAL LOW (ref 13.0–17.0)
Hemoglobin: 11.1 g/dL — ABNORMAL LOW (ref 13.0–17.0)
MCH: 34.5 pg — ABNORMAL HIGH (ref 26.0–34.0)
MCH: 34.5 pg — ABNORMAL HIGH (ref 26.0–34.0)
MCH: 31.7 pg (ref 26.0–34.0)
MCHC: 32.8 g/dL (ref 30.0–36.0)
MCHC: 32.4 g/dL (ref 30.0–36.0)
MCHC: 33.6 g/dL (ref 30.0–36.0)
MCV: 105.3 fL — ABNORMAL HIGH (ref 80.0–100.0)
MCV: 106.4 fL — ABNORMAL HIGH (ref 80.0–100.0)
MCV: 94.3 fL (ref 80.0–100.0)
Platelets: 868 10*3/uL — ABNORMAL HIGH (ref 150–400)
Platelets: 874 10*3/uL — ABNORMAL HIGH (ref 150–400)
Platelets: 787 10*3/uL — ABNORMAL HIGH (ref 150–400)
RBC: 3.04 MIL/uL — ABNORMAL LOW (ref 4.22–5.81)
RBC: 2.03 MIL/uL — ABNORMAL LOW (ref 4.22–5.81)
RBC: 3.5 MIL/uL — ABNORMAL LOW (ref 4.22–5.81)
RDW: 18.6 % — ABNORMAL HIGH (ref 11.5–15.5)
RDW: 19.4 % — ABNORMAL HIGH (ref 11.5–15.5)
RDW: 21.2 % — ABNORMAL HIGH (ref 11.5–15.5)
WBC: 10.5 10*3/uL (ref 4.0–10.5)
WBC: 11.5 10*3/uL — ABNORMAL HIGH (ref 4.0–10.5)
WBC: 12.6 10*3/uL — ABNORMAL HIGH (ref 4.0–10.5)
nRBC: 0.4 % — ABNORMAL HIGH (ref 0.0–0.2)
nRBC: 0.4 % — ABNORMAL HIGH (ref 0.0–0.2)
nRBC: 0.2 % (ref 0.0–0.2)

## 2020-02-14 LAB — RETICULOCYTES
Immature Retic Fract: 23.1 % — ABNORMAL HIGH (ref 2.3–15.9)
RBC.: 3.47 MIL/uL — ABNORMAL LOW (ref 4.22–5.81)
Retic Count, Absolute: 72.9 10*3/uL (ref 19.0–186.0)
Retic Ct Pct: 2.1 % (ref 0.4–3.1)

## 2020-02-14 LAB — PREPARE RBC (CROSSMATCH)

## 2020-02-14 LAB — BASIC METABOLIC PANEL
Anion gap: 10 (ref 5–15)
Anion gap: 9 (ref 5–15)
BUN: 46 mg/dL — ABNORMAL HIGH (ref 8–23)
BUN: 49 mg/dL — ABNORMAL HIGH (ref 8–23)
CO2: 21 mmol/L — ABNORMAL LOW (ref 22–32)
CO2: 18 mmol/L — ABNORMAL LOW (ref 22–32)
Calcium: 8 mg/dL — ABNORMAL LOW (ref 8.9–10.3)
Calcium: 7.1 mg/dL — ABNORMAL LOW (ref 8.9–10.3)
Chloride: 105 mmol/L (ref 98–111)
Chloride: 110 mmol/L (ref 98–111)
Creatinine, Ser: 2.01 mg/dL — ABNORMAL HIGH (ref 0.61–1.24)
Creatinine, Ser: 1.6 mg/dL — ABNORMAL HIGH (ref 0.61–1.24)
GFR, Estimated: 33 mL/min — ABNORMAL LOW (ref >60)
GFR, Estimated: 44 mL/min — ABNORMAL LOW (ref >60)
Glucose, Bld: 184 mg/dL — ABNORMAL HIGH (ref 70–99)
Glucose, Bld: 130 mg/dL — ABNORMAL HIGH (ref 70–99)
Potassium: 3.2 mmol/L — ABNORMAL LOW (ref 3.5–5.1)
Potassium: 4.3 mmol/L (ref 3.5–5.1)
Sodium: 136 mmol/L (ref 135–145)
Sodium: 137 mmol/L (ref 135–145)

## 2020-02-14 LAB — PATHOLOGIST SMEAR REVIEW

## 2020-02-14 LAB — GLUCOSE, CAPILLARY
Glucose-Capillary: 183 mg/dL — ABNORMAL HIGH (ref 70–99)
Glucose-Capillary: 123 mg/dL — ABNORMAL HIGH (ref 70–99)
Glucose-Capillary: 149 mg/dL — ABNORMAL HIGH (ref 70–99)
Glucose-Capillary: 146 mg/dL — ABNORMAL HIGH (ref 70–99)

## 2020-02-14 LAB — ABO/RH: ABO/RH(D): A POS

## 2020-02-14 LAB — MAGNESIUM: Magnesium: 1.9 mg/dL (ref 1.7–2.4)

## 2020-02-14 LAB — PROTIME-INR
INR: 1.6 — ABNORMAL HIGH (ref 0.8–1.2)
Prothrombin Time: 18.1 seconds — ABNORMAL HIGH (ref 11.4–15.2)

## 2020-02-14 LAB — PHOSPHORUS: Phosphorus: 2.4 mg/dL — ABNORMAL LOW (ref 2.5–4.6)

## 2020-02-14 LAB — FIBRINOGEN: Fibrinogen: 275 mg/dL (ref 210–475)

## 2020-02-14 MED ORDER — POTASSIUM CHLORIDE 10 MEQ/100ML IV SOLN
10.0000 meq | INTRAVENOUS | Status: AC
  Administered 2020-02-14 (×3): 10 meq via INTRAVENOUS
  Filled 2020-02-14 (×2): qty 100

## 2020-02-14 MED ORDER — CALCIUM GLUCONATE-NACL 1-0.675 GM/50ML-% IV SOLN
1.0000 g | Freq: Once | INTRAVENOUS | Status: AC
  Administered 2020-02-14: 1000 mg via INTRAVENOUS
  Filled 2020-02-14: qty 50

## 2020-02-14 MED ORDER — TECHNETIUM TC 99M-LABELED RED BLOOD CELLS IV KIT
24.0000 | PACK | Freq: Once | INTRAVENOUS | Status: AC | PRN
  Administered 2020-02-14: 24 via INTRAVENOUS

## 2020-02-14 MED ORDER — INSULIN ASPART 100 UNIT/ML ~~LOC~~ SOLN: 0.0000 [IU] | SUBCUTANEOUS | Status: DC

## 2020-02-14 MED ORDER — POTASSIUM CHLORIDE 10 MEQ/100ML IV SOLN
INTRAVENOUS | Status: AC
  Filled 2020-02-14: qty 100

## 2020-02-14 MED ORDER — POTASSIUM CHLORIDE CRYS ER 20 MEQ PO TBCR: 40.0000 meq | EXTENDED_RELEASE_TABLET | Freq: Once | ORAL | Status: DC

## 2020-02-14 MED ORDER — ONDANSETRON HCL 4 MG/2ML IJ SOLN
INTRAMUSCULAR | Status: AC
  Administered 2020-02-14: 4 mg
  Filled 2020-02-14: qty 2

## 2020-02-14 MED ORDER — SODIUM CHLORIDE 0.9% IV SOLUTION: Freq: Once | INTRAVENOUS | Status: DC

## 2020-02-14 MED ORDER — DIPHENHYDRAMINE HCL 50 MG/ML IJ SOLN
INTRAMUSCULAR | Status: AC
  Filled 2020-02-14: qty 1

## 2020-02-14 MED ORDER — LACTATED RINGERS IV SOLN: INTRAVENOUS | Status: DC

## 2020-02-14 MED ORDER — ATROPINE SULFATE 1 MG/10ML IJ SOSY
PREFILLED_SYRINGE | INTRAMUSCULAR | Status: AC
  Filled 2020-02-14: qty 10

## 2020-02-14 MED ORDER — POTASSIUM CHLORIDE CRYS ER 20 MEQ PO TBCR: 20.0000 meq | EXTENDED_RELEASE_TABLET | Freq: Once | ORAL | Status: DC

## 2020-02-14 MED ORDER — K PHOS MONO-SOD PHOS DI & MONO 155-852-130 MG PO TABS
500.0000 mg | ORAL_TABLET | Freq: Two times a day (BID) | ORAL | Status: DC
  Filled 2020-02-14 (×2): qty 2

## 2020-02-14 MED ORDER — SODIUM CHLORIDE 0.9 % IV SOLN
80.0000 mg | Freq: Once | INTRAVENOUS | Status: AC
  Administered 2020-02-14: 80 mg via INTRAVENOUS
  Filled 2020-02-14: qty 80

## 2020-02-14 MED ORDER — LACTATED RINGERS IV BOLUS
500.0000 mL | Freq: Once | INTRAVENOUS | Status: AC
  Administered 2020-02-14: 500 mL via INTRAVENOUS

## 2020-02-14 MED ORDER — MAGNESIUM SULFATE 2 GM/50ML IV SOLN
2.0000 g | Freq: Once | INTRAVENOUS | Status: AC
  Administered 2020-02-14: 2 g via INTRAVENOUS
  Filled 2020-02-14: qty 50

## 2020-02-14 MED ORDER — CHLORHEXIDINE GLUCONATE CLOTH 2 % EX PADS
6.0000 | MEDICATED_PAD | Freq: Every day | CUTANEOUS | Status: DC
  Administered 2020-02-14 – 2020-02-18 (×4): 6 via TOPICAL

## 2020-02-14 MED ORDER — SODIUM CHLORIDE 0.9 % IV SOLN
8.0000 mg/h | INTRAVENOUS | Status: DC
  Administered 2020-02-14 – 2020-02-16 (×4): 8 mg/h via INTRAVENOUS
  Filled 2020-02-14 (×7): qty 80

## 2020-02-14 MED ORDER — SODIUM CHLORIDE 0.9% IV SOLUTION: Freq: Once | INTRAVENOUS | Status: AC

## 2020-02-14 MED ORDER — PANTOPRAZOLE SODIUM 40 MG IV SOLR: 40.0000 mg | Freq: Two times a day (BID) | INTRAVENOUS | Status: DC

## 2020-02-14 MED ORDER — DIPHENHYDRAMINE HCL 50 MG/ML IJ SOLN
50.0000 mg | Freq: Once | INTRAMUSCULAR | Status: AC
  Administered 2020-02-14: 50 mg via INTRAVENOUS

## 2020-02-14 MED ORDER — TRANEXAMIC ACID-NACL 1000-0.7 MG/100ML-% IV SOLN
1000.0000 mg | INTRAVENOUS | Status: AC
  Administered 2020-02-14: 1000 mg via INTRAVENOUS
  Filled 2020-02-14: qty 100

## 2020-02-14 MED ORDER — MAGNESIUM SULFATE IN D5W 1-5 GM/100ML-% IV SOLN
1.0000 g | Freq: Once | INTRAVENOUS | Status: AC
  Administered 2020-02-14: 1 g via INTRAVENOUS
  Filled 2020-02-14: qty 100

## 2020-02-14 MED ORDER — POTASSIUM PHOSPHATES 15 MMOLE/5ML IV SOLN
15.0000 mmol | Freq: Once | INTRAVENOUS | Status: AC
  Administered 2020-02-14: 15 mmol via INTRAVENOUS
  Filled 2020-02-14: qty 5

## 2020-02-14 MED ORDER — SODIUM CHLORIDE 0.9 % IV BOLUS
1500.0000 mL | Freq: Once | INTRAVENOUS | Status: AC
  Administered 2020-02-14: 1500 mL via INTRAVENOUS

## 2020-02-14 MED ORDER — ONDANSETRON HCL 4 MG/2ML IJ SOLN
4.0000 mg | INTRAMUSCULAR | Status: DC | PRN
  Administered 2020-02-15 – 2020-02-16 (×2): 4 mg via INTRAVENOUS
  Filled 2020-02-14 (×2): qty 2

## 2020-02-14 NOTE — Consult Note (Signed)
NAME:  William Villanueva, MRN:  [id number], DOB:  09/13/1950, LOS: 2 ADMISSION DATE:  02/12/2020, CONSULTATION DATE:  02/14/20 REFERRING MD:  Tyrell Antonio, CHIEF COMPLAINT:  shock  Brief History   69 yo admitted with gastroenteritis, hypovolemia. Hypotensive 10/9, somewhat fluid responsive initially, but becomes hypotensive again.  Has gotten 2L IVF. Hgb has dropped from 13 to 10 -- ordering 2 PRBC  History of present illness   69 yo M PMH UC (followed by Dr. 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 Jude, who was admitted to [facility] service 10/7 with CC n/v/d. Ongoing n/v/d x 3 weeks prior to ED presentation. No bloody BM or emesis.  Associated poor appetite and low PO intake, lethargy. Denied SOB fever chills, sick contacts.  In ED found to also have AKI, metabolic acidosis.  Pt has been gently administered fluids following admission. GI consulted -- found that pt has hx UC and these symptoms arose after pt had delay in acquiring Sulfasalazine.   10/9 pt with near syncope when using restroom, followed by hypotension. PCCM consulted, at time of PCCM eval, pt had begun IVF bolus and BP had normalized. PCCM called again when this recurred following antihypertensive medication administration, bloody loose stool. Hgb noted to be 10.5 from 13.7 prior. Pt has had 2L IVF resusc. PCCM ordering 2 PRBC and will transfer to ICU    Past Medical History  UC 2nd degree AV block NICM HTN Polycythemia vera Gout DM Significant Hospital Events   10/7 admitted to [facility] with diarrhea 10/8 GI consulted 10/9 Heme, PCCM consulted   Consults:  GI Heme PCCM Procedures:    Significant Diagnostic Tests:  10/9 CT a/p >>   Micro Data:  10/7 SARS Cov2> neg 10/7 GI panel> neg  10/7 CDiff > neg   Antimicrobials:    Interim history/subjective:  Variable BP. Episodes of hypotension following antihypertensive medication administration, possible  near syncope using restroom (?Vagal). Initially responsive to fluids but continues to become hypotensive.  S/p 2L IVF Ordering 2PRBC  Going for CT a/p   Objective   Blood pressure (!) 127/109, pulse (!) 54, temperature 98.7 F (37.1 C), temperature source Oral, resp. rate 19, height 5' 10"  (1.778 m), weight 126.6 kg, SpO2 94 %.        Intake/Output Summary (Last 24 hours) at 02/14/2020 1111 Last data filed at 02/14/2020 0600 Gross per 24 hour  Intake 2661.09 ml  Output 2 ml  Net 2659.09 ml   Filed Weights   02/12/20 1817 02/12/20 2351 02/13/20 2106  Weight: 128.1 kg 126.6 kg 126.6 kg    Examination: General: Ill appearing adult M, supine in bed NAD  HENT: NCAT Lungs: Symmetrical chest expansion. No accessory muscle use. CTAb Cardiovascular: RRR s1s2 no rgm  Abdomen: Round, soft. + bowel sounds. Mild generalized tenderness to palpation. No guarding, no rebound tenderness Extremities: Symmetrical bulk and tone, no obvious joint deformity. No cyanosis or clubbing  Neuro: AAOx4 following commands no focal deficit GU: wnl Skin: pale, warm, dry. Without rash  Resolved Hospital Problem list     Assessment & Plan:   Shock -favor hypovolemic shock in setting of n/v/d. Possible hemorrhagic component with drop in hgb P -transfer to ICU -STAT CT a/p (non con) r/o RPB. If nothing seen, CT a/p with con  -2 PRBC  -Cont IVF  Anemia, worsening -14 > 10 > now 7 -?RPB vs ?splenic rupture r/t PV though Abd exam quite benign. 1x small volume  bloody BM but nothing that would explain this kind of Hgb drop. Possible component of hemodilution, but doubt significant enough to explain rapid drop Coagulopathy, mild  -INR 1.6 P -CT a/p as above  -2 PRBC ordered in response to Hgb 10 -d/w GI -- I will update once CT a/p results  -anticipate likely will need further blood resusc -No FFP for now (INR 1.6, won't be of great yield) -STAT fibrinogen    Diarrhea -- likely UC flare -sx began  after pt ran out of UC med -GI panel, CDiff neg. Bloody diarrhea -- likely due to UC flare P -GI has started on home sulfasalazine, added budesonide -supportive care -Protonix   Presyncope -due to hypotension vs possible vagal (occurred during BM) P -will cont volume resusc as above -BR  -Cardiology consulted by [facility]-- agree with Cards, suspect that presyncope is not r/t dysrhythmia but more likely hypotension   AKI, improving  -suspect pre-renal due to hypovolemia, hypotension. Is possible there is component of ATN  P -trend renal indices, I/O STAT Ct a/p -- will try non-con, if nothing visible will do CT a/p With contras given rapid Hgb decline; anticipate there may be further Cr bump following contrast but benefit of ID bleed is greater.  Polycythemia Vera, thrombocytosis (JAK2 pos) -Followed by Dr. Mosetta Anis  -on Hydrea     Hypokalemia -will recheck after product administration  -May need to replace  Hypomagnesemia -replace as needed   DM  -SSI  Best practice:  Diet: NPO Pain/Anxiety/Delirium protocol (if indicated): na VAP protocol (if indicated): na DVT prophylaxis: SCD GI prophylaxis: protonix  Glucose control:  SSI Mobility: BR Code Status: Full Family Communication: I spoke w wife briefly on eval. Further discussion pending arrival to ICU Disposition: ICU   Labs   CBC: Recent Labs  Lab 02/12/20 1841 02/12/20 2313 02/13/20 0320 02/14/20 0813  WBC 11.0* 11.3* 10.8* 10.5  NEUTROABS 8.9*  --   --   --   HGB 13.7 13.8 13.7 10.5*  HCT 41.0 41.7 40.4 32.0*  MCV 104.3* 104.3* 105.5* 105.3*  PLT 913* 913* 812* 868*    Basic Metabolic Panel: Recent Labs  Lab 02/12/20 1841 02/12/20 2118 02/12/20 2313 02/13/20 0320 02/14/20 0813  NA 133*  --   --  133* 136  K 3.1*  --   --  3.6 3.2*  CL 105  --   --  105 105  CO2 16*  --   --  12* 21*  GLUCOSE 126*  --   --  97 184*  BUN 61*  --   --  60* 46*  CREATININE 2.12*  --  2.15* 2.09* 2.01*  CALCIUM  8.4*  --   --  8.6* 8.0*  MG  --  1.6*  --   --  1.9  PHOS  --   --   --   --  2.4*   GFR: Estimated Creatinine Clearance: 47 mL/min (A) (by C-G formula based on SCr of 2.01 mg/dL (H)). Recent Labs  Lab 02/12/20 1841 02/12/20 1922 02/12/20 2313 02/13/20 0320 02/14/20 0813  WBC 11.0*  --  11.3* 10.8* 10.5  LATICACIDVEN  --  1.6  --  1.1  --     Liver Function Tests: Recent Labs  Lab 02/12/20 1841 02/13/20 0320  AST 18 21  ALT 7 11  ALKPHOS 64 61  BILITOT 0.3 0.7  PROT 6.3* 6.3*  ALBUMIN 3.4* 3.2*   Recent Labs  Lab 02/12/20 1841  LIPASE  63*  AMYLASE 116*   No results for input(s): AMMONIA in the last 168 hours.  ABG    Component Value Date/Time   PHART 7.368 01/15/2012 0820   PCO2ART 35.7 01/15/2012 0820   PO2ART 62.0 (L) 01/15/2012 0820   HCO3 23.5 01/15/2012 0828   TCO2 25 01/15/2012 0828   ACIDBASEDEF 2.0 01/15/2012 0828   O2SAT 65.4 04/29/2014 0343     Coagulation Profile: No results for input(s): INR, PROTIME in the last 168 hours.  Cardiac Enzymes: No results for input(s): CKTOTAL, CKMB, CKMBINDEX, TROPONINI in the last 168 hours.  HbA1C: Hgb A1c MFr Bld  Date/Time Value Ref Range Status  04/28/2014 08:23 PM 6.3 (H) <5.7 % Final    Comment:    (NOTE)                                                                       According to the ADA Clinical Practice Recommendations for 2011, when HbA1c is used as a screening test:  >=6.5%   Diagnostic of Diabetes Mellitus           (if abnormal result is confirmed) 5.7-6.4%   Increased risk of developing Diabetes Mellitus References:Diagnosis and Classification of Diabetes Mellitus,Diabetes WNUU,7253,66(YQIHK 1):S62-S69 and Standards of Medical Care in         Diabetes - 2011,Diabetes VQQV,9563,87 (Suppl 1):S11-S61.     CBG: Recent Labs  Lab 02/13/20 0655 02/13/20 1141 02/13/20 1637 02/13/20 2107 02/14/20 0716  GLUCAP 119* 126* 151* 128* 183*    Review of Systems:   + diarrhea +  nausea + vomiting +weakness + fatigue - SOB -chest pain + dizziness  + loss of appetite -changes to skin hair nails   Past Medical History  He,  has a past medical history of AV block, 2nd degree (2015), Back pain, Cardiomyopathy ([place]), CHF (congestive heart failure) ([place]), Gout, Hemochromatosis, Hypertension, Hypospadias (1950-08-16), Nephrolithiasis, and Polycythemia vera(238.4).   Surgical History    Past Surgical History:  Procedure Laterality Date  . ANKLE SURGERY    . BI-VENTRICULAR PACEMAKER INSERTION N/A 04/29/2014   Procedure: BI-VENTRICULAR PACEMAKER INSERTION (CRT-P);  Surgeon: Deboraha Sprang, MD; Laterality: Left  St. Jude Allure Quadra pulse generator [phone], model PM [phone]  . PILONIDAL CYST EXCISION    . POSTERIOR LAMINECTOMY / DECOMPRESSION LUMBAR SPINE    . TONSILLECTOMY       Social History   reports that he quit smoking about 6 years ago. His smoking use included cigarettes. He started smoking about 51 years ago. He has a 44.00 pack-year smoking history. He has never used smokeless tobacco. He reports current alcohol use. He reports that he does not use drugs.   Family History   His family history includes Diabetes in his sister; Heart disease in his maternal grandmother; Hypertension in his sister; Other in his father; Stroke in his mother.   Allergies Allergies  Allergen Reactions  . Bupropion Hives  . Cephalexin Diarrhea and Other (See Comments)    Caused C-diff, also   . Fluoxetine Itching  . Ibuprofen Itching  . Prednisone Itching and Other (See Comments)    Abdominal pain, also   . Temazepam Other (See Comments)    Dizziness   . Trazodone And Nefazodone  Other (See Comments)    Dizziness  . Fluoxetine Hcl Itching  . Prozac [Fluoxetine Hcl] Itching     Home Medications  Prior to Admission medications   Medication Sig Start Date End Date Taking? Authorizing Provider  allopurinol (ZYLOPRIM) 300 MG tablet Take 300 mg by mouth daily. 06/30/19   Yes [provider]  aspirin 81 MG tablet Take 81 mg by mouth at bedtime.    Yes [provider]  carvedilol (COREG) 12.5 MG tablet Take 1 tablet (12.5 mg total) by mouth 2 (two) times daily with a meal. 10/14/19  Yes Crenshaw, Denice Bors, MD  citalopram (CELEXA) 10 MG tablet Take 5 mg by mouth daily.  01/27/16  Yes [provider]  ENTRESTO 24-26 MG TAKE ONE TABLET BY MOUTH TWICE A DAY Patient taking differently: Take 1 tablet by mouth 2 (two) times daily.  07/09/19  Yes Lelon Perla, MD  folic acid (FOLVITE) 1 MG tablet Take 1 mg by mouth daily.   Yes [provider]  hydroxyurea (HYDREA) 500 MG capsule TAKE TWO CAPSULES BY MOUTH DAILY Patient taking differently: Take 500 mg by mouth in the morning and at bedtime.  09/09/19  Yes Ennever, Rudell Cobb, MD  insulin degludec (TRESIBA FLEXTOUCH) 100 UNIT/ML SOPN FlexTouch Pen Inject 20 Units into the skin daily after breakfast.    Yes [provider]  methotrexate 2.5 MG tablet Take 12.5 mg by mouth See admin instructions. Take 12.5 mg by mouth in the morning and 12.5 mg in the evening ONLY ON TUESDAYS 11/07/19  Yes [provider]  omeprazole (PRILOSEC) 20 MG capsule Take 20 mg by mouth 2 (two) times daily before a meal.  07/05/16  Yes [provider]  oxybutynin (DITROPAN-XL) 10 MG 24 hr tablet Take 10 mg by mouth daily. 10/02/19  Yes [provider]  rosuvastatin (CRESTOR) 5 MG tablet Take 5 mg by mouth daily.   Yes [provider]  spironolactone (ALDACTONE) 25 MG tablet Take 0.5 tablets (12.5 mg total) by mouth daily. 10/14/19  Yes Lelon Perla, MD  sulfaSALAzine (AZULFIDINE) 500 MG tablet Take 2,000 mg by mouth in the morning and at bedtime.  11/05/19  Yes [provider]  tamsulosin (FLOMAX) 0.4 MG CAPS capsule Take 0.4 mg by mouth 2 (two) times daily.  08/03/17  Yes [provider]  BD PEN NEEDLE NANO U/F 32G X 4 MM MISC  06/16/18   [provider]   febuxostat (ULORIC) 40 MG tablet Take 80 mg by mouth daily. Patient not taking: Reported on 02/12/2020    [provider]  furosemide (LASIX) 40 MG tablet Take 1 tablet (40 mg total) by mouth daily. 01/02/20 04/01/20  Chanetta Marshall K, NP  ondansetron (ZOFRAN-ODT) 4 MG disintegrating tablet Take 4 mg by mouth every 8 (eight) hours as needed for nausea or vomiting. Patient not taking: Reported on 02/12/2020    [provider]     Critical care time: 60 min    CRITICAL CARE Performed by: Cristal Generous   Total critical care time: 60 minutes  Critical care time was exclusive of separately billable procedures and treating other patients. Critical care was necessary to treat or prevent imminent or life-threatening deterioration.  Critical care was time spent personally by me on the following activities: development of treatment plan with patient and/or surrogate as well as nursing, discussions with consultants, evaluation of patient's response to treatment, examination of patient, obtaining history from patient or surrogate, ordering  and performing treatments and interventions, ordering and review of laboratory studies, ordering and review of radiographic studies, pulse oximetry and re-evaluation of patient's condition.  Eliseo Gum MSN, AGACNP-BC [place] [phone] If no answer, [phone] 02/14/2020, 4:16 PM

## 2020-02-14 NOTE — Progress Notes (Signed)
PCCM Family communication note  I spoke w pt wife in ICU waiting room upon pt arrival to ICU. Discussed further drop in Hgb now to 7, CT a/p non con without evidence of RPB.  Discussed need for further blood product resuscitation, and uncertainty for what exactly is driving this acute anemia.   All questions answered  Eliseo Gum MSN, AGACNP-BC [place] [phone] If no answer, [phone] 02/14/2020, 4:50 PM

## 2020-02-14 NOTE — Progress Notes (Signed)
Subjective: Patient reports an episode of presyncope when he was in the bathroom today morning. When I was in the room he was getting his 12-lead EKG and was noted to be tachycardic with a heart rate around 124/min. Patient reports that the diarrhea has markedly improved, from once every hour yesterday to none overnight and only 1 bowel movement today. He denies nausea, vomiting or abdominal pain.  Objective: Vital signs in last 24 hours: Temp:  [97.6 F (36.4 C)-98.7 F (37.1 C)] 98.7 F (37.1 C) (10/09 0826) Pulse Rate:  [54-89] 54 (10/09 0826) Resp:  [16-19] 19 (10/09 0826) BP: (94-127)/(43-109) 127/109 (10/09 0826) SpO2:  [93 %-98 %] 94 % (10/09 0826) Weight:  [126.6 kg] 126.6 kg (10/08 2106) Weight change: -1.516 kg Last BM Date: 02/13/20  PE: Obese, not in distress GENERAL: Able to speak in full sentences, mild pallor  ABDOMEN: Obese abdomen, but soft, nondistended, nontender, normoactive bowel sounds EXTREMITIES: No deformity, no edema  Lab Results: Results for orders placed or performed during the hospital encounter of 02/12/20 (from the past 48 hour(s))  CBC with Differential     Status: Abnormal   Collection Time: 02/12/20  6:41 PM  Result Value Ref Range   WBC 11.0 (H) 4.0 - 10.5 K/uL   RBC 3.93 (L) 4.22 - 5.81 MIL/uL   Hemoglobin 13.7 13.0 - 17.0 g/dL   HCT 41.0 39 - 52 %   MCV 104.3 (H) 80.0 - 100.0 fL   MCH 34.9 (H) 26.0 - 34.0 pg   MCHC 33.4 30.0 - 36.0 g/dL   RDW 18.3 (H) 11.5 - 15.5 %   Platelets 913 (HH) 150 - 400 K/uL    Comment: REPEATED TO VERIFY PLATELET COUNT CONFIRMED BY SMEAR THIS CRITICAL RESULT HAS VERIFIED AND BEEN CALLED TO A DAVIDSON RN BY KIRSTENE FORSYTH ON 10 07 2021 AT 1936, AND HAS BEEN READ BACK.     nRBC 0.0 0.0 - 0.2 %   Neutrophils Relative % 81 %   Neutro Abs 8.9 (H) 1.7 - 7.7 K/uL   Lymphocytes Relative 3 %   Lymphs Abs 0.3 (L) 0.7 - 4.0 K/uL   Monocytes Relative 4 %   Monocytes Absolute 0.4 0.1 - 1.0 K/uL   Eosinophils  Relative 8 %   Eosinophils Absolute 0.9 (H) 0 - 0 K/uL   Basophils Relative 4 %   Basophils Absolute 0.4 (H) 0 - 0 K/uL   Abs Immature Granulocytes 0.00 0.00 - 0.07 K/uL   Hypersegmented Neutrophils PRESENT    Ovalocytes PRESENT     Comment: Performed at [facility] [address] [place], [place] [address]  Comprehensive metabolic panel     Status: Abnormal   Collection Time: 02/12/20  6:41 PM  Result Value Ref Range   Sodium 133 (L) 135 - 145 mmol/L   Potassium 3.1 (L) 3.5 - 5.1 mmol/L   Chloride 105 98 - 111 mmol/L   CO2 16 (L) 22 - 32 mmol/L   Glucose, Bld 126 (H) 70 - 99 mg/dL    Comment: Glucose reference range applies only to samples taken after fasting for at least 8 hours.   BUN 61 (H) 8 - 23 mg/dL   Creatinine, Ser 2.12 (H) 0.61 - 1.24 mg/dL   Calcium 8.4 (L) 8.9 - 10.3 mg/dL   Total Protein 6.3 (L) 6.5 - 8.1 g/dL   Albumin 3.4 (L) 3.5 - 5.0 g/dL   AST 18 15 - 41 U/L   ALT 7 0 -  44 U/L   Alkaline Phosphatase 64 38 - 126 U/L   Total Bilirubin 0.3 0.3 - 1.2 mg/dL   GFR calc non Af Amer 31 (L) >60 mL/min   Anion gap 12 5 - 15    Comment: Performed at [facility] [address], [place], [place] [address]  Lipase, blood     Status: Abnormal   Collection Time: 02/12/20  6:41 PM  Result Value Ref Range   Lipase 63 (H) 11 - 51 U/L    Comment: Performed at [facility] [address] [place], [place] [address]  Amylase     Status: Abnormal   Collection Time: 02/12/20  6:41 PM  Result Value Ref Range   Amylase 116 (H) 28 - 100 U/L    Comment: Performed at [facility], [place] [address], [place], [place] [address]  Troponin I (High Sensitivity)     Status: Abnormal   Collection Time: 02/12/20  6:41 PM  Result Value Ref Range   Troponin I (High Sensitivity) 35 (H) <18 ng/L    Comment: (NOTE) Elevated high sensitivity troponin I (hsTnI) values and significant  changes across serial measurements may suggest ACS but many other  chronic and acute  conditions are known to elevate hsTnI results.  Refer to the "Links" section for chest pain algorithms and additional  guidance. Performed at [facility], [place] [address] [place], [place] [address]   Brain natriuretic peptide     Status: None   Collection Time: 02/12/20  6:53 PM  Result Value Ref Range   B Natriuretic Peptide 65.0 0.0 - 100.0 pg/mL    Comment: Performed at [facility] [address], [place], [place] [address]  Lactic acid, plasma     Status: None   Collection Time: 02/12/20  7:22 PM  Result Value Ref Range   Lactic Acid, Venous 1.6 0.5 - 1.9 mmol/L    Comment: Performed at [facility] [address] [place], [place] [address]  Gastrointestinal Panel by PCR , Stool     Status: None   Collection Time: 02/12/20  7:23 PM   Specimen: Stool  Result Value Ref Range   Campylobacter species NOT DETECTED NOT DETECTED   Plesimonas shigelloides NOT DETECTED NOT DETECTED   Salmonella species NOT DETECTED NOT DETECTED   Yersinia enterocolitica NOT DETECTED NOT DETECTED   Vibrio species NOT DETECTED NOT DETECTED   Vibrio cholerae NOT DETECTED NOT DETECTED   Enteroaggregative E coli (EAEC) NOT DETECTED NOT DETECTED   Enteropathogenic E coli (EPEC) NOT DETECTED NOT DETECTED   Enterotoxigenic E coli (ETEC) NOT DETECTED NOT DETECTED   Shiga like toxin producing E coli (STEC) NOT DETECTED NOT DETECTED   Shigella/Enteroinvasive E coli (EIEC) NOT DETECTED NOT DETECTED   Cryptosporidium NOT DETECTED NOT DETECTED   Cyclospora cayetanensis NOT DETECTED NOT DETECTED   Entamoeba histolytica NOT DETECTED NOT DETECTED   Giardia lamblia NOT DETECTED NOT DETECTED   Adenovirus F40/41 NOT DETECTED NOT DETECTED   Astrovirus NOT DETECTED NOT DETECTED   Norovirus GI/GII NOT DETECTED NOT DETECTED   Rotavirus A NOT DETECTED NOT DETECTED   Sapovirus (I, II, IV, and V) NOT DETECTED NOT DETECTED    Comment: Performed at [facility], [address],  [place], [place] [address]  C Difficile Quick Screen w PCR reflex     Status: None   Collection Time: 02/12/20  7:23 PM   Specimen: Stool  Result  Value Ref Range   C Diff antigen NEGATIVE NEGATIVE   C Diff toxin NEGATIVE NEGATIVE   C Diff interpretation No C. difficile detected.     Comment: Performed at [facility], [place] [address] [place], [place] [address]  Magnesium     Status: Abnormal   Collection Time: 02/12/20  9:18 PM  Result Value Ref Range   Magnesium 1.6 (L) 1.7 - 2.4 mg/dL    Comment: Performed at [facility] [address] [place], [place] [address]  Troponin I (High Sensitivity)     Status: Abnormal   Collection Time: 02/12/20  9:18 PM  Result Value Ref Range   Troponin I (High Sensitivity) 34 (H) <18 ng/L    Comment: (NOTE) Elevated high sensitivity troponin I (hsTnI) values and significant  changes across serial measurements may suggest ACS but many other  chronic and acute conditions are known to elevate hsTnI results.  Refer to the "Links" section for chest pain algorithms and additional  guidance. Performed at [facility], [place] [address] [place], [place] [address]   Respiratory Panel by RT PCR (Flu A&B, Covid) - Nasopharyngeal Swab     Status: None   Collection Time: 02/12/20 10:24 PM   Specimen: Nasopharyngeal Swab  Result Value Ref Range   SARS Coronavirus 2 by RT PCR NEGATIVE NEGATIVE    Comment: (NOTE) SARS-CoV-2 target nucleic acids are NOT DETECTED.  The SARS-CoV-2 RNA is generally detectable in upper respiratoy specimens during the acute phase of infection. The lowest concentration of SARS-CoV-2 viral copies this assay can detect is 131 copies/mL. A negative result does not preclude SARS-Cov-2 infection and should not be used as the sole basis for treatment or other patient management decisions. A negative result may occur with  improper specimen collection/handling, submission of specimen other than nasopharyngeal swab, presence  of viral mutation(s) within the areas targeted by this assay, and inadequate number of viral copies (<131 copies/mL). A negative result must be combined with clinical observations, patient history, and epidemiological information. The expected result is Negative.  Fact Sheet for Patients:  PinkCheek.be  Fact Sheet for Healthcare Providers:  GravelBags.it  This test is no t yet approved or cleared by the [place] FDA and  has been authorized for detection and/or diagnosis of SARS-CoV-2 by FDA under an Emergency Use Authorization (EUA). This EUA will remain  in effect (meaning this test can be used) for the duration of the COVID-19 declaration under Section 564(b)(1) of the Act, 21 U.S.C. section 360bbb-3(b)(1), unless the authorization is terminated or revoked sooner.     Influenza A by PCR NEGATIVE NEGATIVE   Influenza B by PCR NEGATIVE NEGATIVE    Comment: (NOTE) The Xpert Xpress SARS-CoV-2/FLU/RSV assay is intended as an aid in  the diagnosis of influenza from Nasopharyngeal swab specimens and  should not be used as a sole basis for treatment. Nasal washings and  aspirates are unacceptable for Xpert Xpress SARS-CoV-2/FLU/RSV  testing.  Fact Sheet for Patients: PinkCheek.be  Fact Sheet for Healthcare Providers: GravelBags.it  This test is not yet approved or cleared by the [place] FDA and  has been authorized for detection and/or diagnosis of SARS-CoV-2 by  FDA under an Emergency Use Authorization (EUA). This EUA will remain  in effect (meaning this test can be used) for the duration of the  Covid-19 declaration under Section 564(b)(1) of the Act, 21  U.S.C. section 360bbb-3(b)(1), unless the authorization is  terminated or revoked. Performed  at [facility], [place] [address], [place], [place] [address]   HIV Antibody (routine testing w rflx)      Status: None   Collection Time: 02/12/20 11:13 PM  Result Value Ref Range   HIV Screen 4th Generation wRfx Non Reactive Non Reactive    Comment: Performed at [facility], [place] [address], [place], [place] [address]  CBC     Status: Abnormal   Collection Time: 02/12/20 11:13 PM  Result Value Ref Range   WBC 11.3 (H) 4.0 - 10.5 K/uL   RBC 4.00 (L) 4.22 - 5.81 MIL/uL   Hemoglobin 13.8 13.0 - 17.0 g/dL   HCT 41.7 39 - 52 %   MCV 104.3 (H) 80.0 - 100.0 fL   MCH 34.5 (H) 26.0 - 34.0 pg   MCHC 33.1 30.0 - 36.0 g/dL   RDW 18.4 (H) 11.5 - 15.5 %   Platelets 913 (HH) 150 - 400 K/uL    Comment: REPEATED TO VERIFY CRITICAL VALUE NOTED.  VALUE IS CONSISTENT WITH PREVIOUSLY REPORTED AND CALLED VALUE. THIS CRITICAL RESULT HAS VERIFIED AND BEEN CALLED TO C. BOKIAGON,RN BY TERRAN TYSOR ON 10 07 2021 AT 2344, AND HAS BEEN READ BACK.     nRBC 0.0 0.0 - 0.2 %    Comment: Performed at [facility], [place] [address], [place], [place] [address]  Creatinine, serum     Status: Abnormal   Collection Time: 02/12/20 11:13 PM  Result Value Ref Range   Creatinine, Ser 2.15 (H) 0.61 - 1.24 mg/dL   GFR calc non Af Amer 31 (L) >60 mL/min    Comment: Performed at [facility] [address] [place], [place] [address]  Lactic acid, plasma     Status: None   Collection Time: 02/13/20  3:20 AM  Result Value Ref Range   Lactic Acid, Venous 1.1 0.5 - 1.9 mmol/L    Comment: Performed at [facility] [address] [place], [place] [address]  Comprehensive metabolic panel     Status: Abnormal   Collection Time: 02/13/20  3:20 AM  Result Value Ref Range   Sodium 133 (L) 135 - 145 mmol/L   Potassium 3.6 3.5 - 5.1 mmol/L   Chloride 105 98 - 111 mmol/L   CO2 12 (L) 22 - 32 mmol/L   Glucose, Bld 97 70 - 99 mg/dL    Comment: Glucose reference range applies only to samples taken after fasting for at least 8 hours.   BUN 60 (H) 8 - 23 mg/dL   Creatinine, Ser 2.09 (H) 0.61 - 1.24 mg/dL    Calcium 8.6 (L) 8.9 - 10.3 mg/dL   Total Protein 6.3 (L) 6.5 - 8.1 g/dL   Albumin 3.2 (L) 3.5 - 5.0 g/dL   AST 21 15 - 41 U/L   ALT 11 0 - 44 U/L   Alkaline Phosphatase 61 38 - 126 U/L   Total Bilirubin 0.7 0.3 - 1.2 mg/dL   GFR calc non Af Amer 32 (L) >60 mL/min   Anion gap 16 (H) 5 - 15    Comment: Performed at [facility] [address], [place] [address]  CBC     Status: Abnormal   Collection Time: 02/13/20  3:20 AM  Result Value Ref Range   WBC 10.8 (H) 4.0 - 10.5 K/uL   RBC 3.83 (L) 4.22 - 5.81 MIL/uL   Hemoglobin 13.7 13.0 - 17.0 g/dL   HCT  40.4 39 - 52 %   MCV 105.5 (H) 80.0 - 100.0 fL   MCH 35.8 (H) 26.0 - 34.0 pg   MCHC 33.9 30.0 - 36.0 g/dL   RDW 18.6 (H) 11.5 - 15.5 %   Platelets 812 (H) 150 - 400 K/uL   nRBC 0.2 0.0 - 0.2 %    Comment: Performed at [facility] [address], [place], [place] [address]  Glucose, capillary     Status: Abnormal   Collection Time: 02/13/20  6:55 AM  Result Value Ref Range   Glucose-Capillary 119 (H) 70 - 99 mg/dL    Comment: Glucose reference range applies only to samples taken after fasting for at least 8 hours.  Glucose, capillary     Status: Abnormal   Collection Time: 02/13/20 11:41 AM  Result Value Ref Range   Glucose-Capillary 126 (H) 70 - 99 mg/dL    Comment: Glucose reference range applies only to samples taken after fasting for at least 8 hours.  Glucose, capillary     Status: Abnormal   Collection Time: 02/13/20  4:37 PM  Result Value Ref Range   Glucose-Capillary 151 (H) 70 - 99 mg/dL    Comment: Glucose reference range applies only to samples taken after fasting for at least 8 hours.   Comment 1 Notify RN   Glucose, capillary     Status: Abnormal   Collection Time: 02/13/20  9:07 PM  Result Value Ref Range   Glucose-Capillary 128 (H) 70 - 99 mg/dL    Comment: Glucose reference range applies only to samples taken after fasting for at least 8 hours.  Glucose, capillary     Status: Abnormal    Collection Time: 02/14/20  7:16 AM  Result Value Ref Range   Glucose-Capillary 183 (H) 70 - 99 mg/dL    Comment: Glucose reference range applies only to samples taken after fasting for at least 8 hours.  CBC     Status: Abnormal   Collection Time: 02/14/20  8:13 AM  Result Value Ref Range   WBC 10.5 4.0 - 10.5 K/uL   RBC 3.04 (L) 4.22 - 5.81 MIL/uL   Hemoglobin 10.5 (L) 13.0 - 17.0 g/dL   HCT 32.0 (L) 39 - 52 %   MCV 105.3 (H) 80.0 - 100.0 fL   MCH 34.5 (H) 26.0 - 34.0 pg   MCHC 32.8 30.0 - 36.0 g/dL   RDW 18.6 (H) 11.5 - 15.5 %   Platelets 868 (H) 150 - 400 K/uL   nRBC 0.4 (H) 0.0 - 0.2 %    Comment: Performed at [facility], [place] [address], [place], [place] [address]  Basic metabolic panel     Status: Abnormal   Collection Time: 02/14/20  8:13 AM  Result Value Ref Range   Sodium 136 135 - 145 mmol/L   Potassium 3.2 (L) 3.5 - 5.1 mmol/L   Chloride 105 98 - 111 mmol/L   CO2 21 (L) 22 - 32 mmol/L   Glucose, Bld 184 (H) 70 - 99 mg/dL    Comment: Glucose reference range applies only to samples taken after fasting for at least 8 hours.   BUN 46 (H) 8 - 23 mg/dL   Creatinine, Ser 2.01 (H) 0.61 - 1.24 mg/dL   Calcium 8.0 (L) 8.9 - 10.3 mg/dL   GFR, Estimated 33 (L) >60 mL/min   Anion gap 10 5 - 15    Comment: Performed at [facility]  [address], [place], [place] [address]  Magnesium     Status: None   Collection Time: 02/14/20  8:13 AM  Result Value Ref Range   Magnesium 1.9 1.7 - 2.4 mg/dL    Comment: Performed at [facility] [address] [place], [place] [address]  Phosphorus     Status: Abnormal   Collection Time: 02/14/20  8:13 AM  Result Value Ref Range   Phosphorus 2.4 (L) 2.5 - 4.6 mg/dL    Comment: Performed at [facility] [address] [place], [place] [address]  Troponin I (High Sensitivity)     Status: Abnormal   Collection Time: 02/14/20  8:13 AM  Result Value Ref Range   Troponin I (High Sensitivity) 37 (H) <18 ng/L     Comment: (NOTE) Elevated high sensitivity troponin I (hsTnI) values and significant  changes across serial measurements may suggest ACS but many other  chronic and acute conditions are known to elevate hsTnI results.  Refer to the "Links" section for chest pain algorithms and additional  guidance. Performed at [facility], [place] [address] [place], [place] [address]     Studies/Results: CT ABDOMEN PELVIS WO CONTRAST  Result Date: 02/12/2020 CLINICAL DATA:  Abdominal pain EXAM: CT ABDOMEN AND PELVIS WITHOUT CONTRAST TECHNIQUE: Multidetector CT imaging of the abdomen and pelvis was performed following the standard protocol without IV contrast. COMPARISON:  CT 02/04/2020 FINDINGS: Lower chest: Lung bases demonstrate no acute consolidation or effusion. Partially visualized intracardiac pacing leads. Hepatobiliary: No focal liver abnormality is seen. No gallstones, gallbladder wall thickening, or biliary dilatation. Pancreas: Unremarkable. No pancreatic ductal dilatation or surrounding inflammatory changes. Spleen: Enlarged with craniocaudal measurement of 15 cm. Adrenals/Urinary Tract: Adrenal glands are normal. No hydronephrosis. Low-density renal lesions likely cysts. Bladder is normal Stomach/Bowel: Stomach is within normal limits. Appendix not well seen but no right lower quadrant inflammatory process. Left colon diverticula without acute inflammatory change. No evidence of bowel wall thickening, distention, or inflammatory changes. Vascular/Lymphatic: Mild to moderate aortic atherosclerosis. No aneurysm. No suspicious adenopathy. Reproductive: Prostate calcification. Other: Negative for free air or free fluid. Musculoskeletal: Degenerative changes. No acute or suspicious osseous abnormality. IMPRESSION: 1. No CT evidence for acute intra-abdominal or pelvic abnormality. 2. Splenomegaly. 3. 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 Aortic Atherosclerosis (ICD10-I70.0).  Electronically Signed   By: Donavan Foil M.D.   On: 02/12/2020 21:10    Medications: I have reviewed the patient's current medications.  Assessment: History of ulcerative colitis, without medications from September, presented with worsening diarrhea, liquid stools, increased frequency of bowel movements  No acute intra-abdominal or pelvic pathology noted on CAT scan except left colonic diverticulosis without inflammation Stool negative for C. difficile and GI pathogen panel  Normal WBC count, hemoglobin 10.5, elevated MCV 105, elevated platelet 868 Renal impairment, BUN 46, creatinine 2.01, GFR 33  Presyncope/syncope today morning with tachycardia Elevated troponin I Has history of nonischemic cardiomyopathy, EF 30 to 35%, pacemaker in place  Plan: From GI standpoint able to tolerate heart healthy diet with improvement in diarrhea Continue budesonide 9 mg daily along with sulfasalazine 2 g twice daily On lactated Ringer's at 75 mL/h  Cause of syncope/presyncope currently unknown, primary team aware.  Ronnette Juniper, MD 02/14/2020, 9:47 AM

## 2020-02-14 NOTE — Plan of Care (Signed)
Moving to ICU, 2 units uncrossmatched blood ordered.  Julian Hy, DO 02/14/20 2:13 PM [facility]

## 2020-02-14 NOTE — Consult Note (Signed)
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 Abdominal Pain  . Nausea  : I had terrible diarrhea.  HPI: [name] is well-known to me.  He is a 69 year old white male.  I have been seeing him for over 15 years.  He has polycythemia vera.  We have been phlebotomize him on occasion.  He is on Hydrea at 1000 mg daily for his thrombocytosis.  He has this colitis.  He has been on sulfasalazine.  When I saw him on September 24, he was having some diarrhea.  He had not been able to get the sulfasalazine from the gastroenterologist.  He finally was able to get this.  However, the diarrhea worsened.  He was getting weak.  He was unsteady.  He ultimately had to go to the hospital.  He was admitted with diarrhea.  He came in on October 7.  At that time, his white cell count 11,000.  Hemoglobin 13.7.  Platelet count 913,000.  His electrolytes show a BUN of 60 creatinine 2.1 on 8 October.  His potassium 3.6.  Sodium 133.  He says the diarrhea is getting better.  He did have a CT scan of the abdomen.  This shows diverticulosis.  No adenopathy was noted.  He had mild splenomegaly.  He has a pacemaker in place.  He has had no cardiac issues.  Gastroenterology has seen him.  He is back on sulfasalazine.  He is also on budesonide.  He is lost about 25 pounds.  We are asked to see him because of his thrombocytosis.  He has been taking the Hydrea.  He has had no bleeding.  He has had no vomiting.  He has had no rashes.  He has chronic back issues.  Has had back surgery.  Overall, his performance status is ECOG 1.     Past Medical History:  Diagnosis Date  . AV block, 2nd degree 2015   [employer] Jude Allure Quadra pulse generator [id number], model PM 3242  . Back pain   . Cardiomyopathy ([place])    Nonischemic 45%.   . CHF (congestive heart failure) ([place])   . Gout   . Hemochromatosis   .  Hypertension   . Hypospadias [birth year]   born with  . Nephrolithiasis   . Polycythemia vera(238.4)   :  Past Surgical History:  Procedure Laterality Date  . ANKLE SURGERY    . BI-VENTRICULAR PACEMAKER INSERTION N/A 04/29/2014   Procedure: BI-VENTRICULAR PACEMAKER INSERTION (CRT-P);  Surgeon: Deboraha Sprang, MD; Laterality: Left  St. Jude Allure Quadra pulse generator [id number], model PM [phone]  . PILONIDAL CYST EXCISION    . POSTERIOR LAMINECTOMY / DECOMPRESSION LUMBAR SPINE    . TONSILLECTOMY    :   Current Facility-Administered Medications:  .  allopurinol (ZYLOPRIM) tablet 300 mg, 300 mg, Oral, Daily, Cox, Amy N, DO, 300 mg at 02/13/20 1002 .  aspirin chewable tablet 81 mg, 81 mg, Oral, Daily, Cox, Amy N, DO, 81 mg at 02/13/20 0951 .  budesonide (ENTOCORT EC) 24 hr capsule 9 mg, 9 mg, Oral, Daily, Baron-Johnson, Alison, PA-C, 9 mg at 02/13/20 1450 .  carvedilol (COREG) tablet 12.5 mg, 12.5 mg, Oral, BID WC, Cox, Amy N, DO, 12.5 mg at 02/13/20 1752 .  citalopram (CELEXA) tablet 5 mg, 5 mg, Oral, Daily, Cox, Amy N, DO, 5 mg at 02/13/20 0945 .  feeding supplement (ENSURE ENLIVE) (ENSURE ENLIVE) liquid 237  mL, 237 mL, Oral, TID BM, Regalado, Belkys A, MD, 237 mL at 02/13/20 1450 .  heparin injection 5,000 Units, 5,000 Units, Subcutaneous, Q8H, Cox, Amy N, DO, 5,000 Units at 02/14/20 0553 .  hydroxyurea (HYDREA) capsule 1,000 mg, 1,000 mg, Oral, BID WC, Cox, Amy N, DO, 1,000 mg at 02/13/20 0900 .  insulin aspart (novoLOG) injection 0-15 Units, 0-15 Units, Subcutaneous, TID AC & HS, Shalhoub, Sherryll Burger, MD, 2 Units at 02/13/20 2120 .  insulin glargine (LANTUS) injection 20 Units, 20 Units, Subcutaneous, Daily, Cox, Amy N, DO, 20 Units at 02/13/20 0949 .  loperamide (IMODIUM) capsule 2 mg, 2 mg, Oral, PRN, Cox, Amy N, DO, 2 mg at 02/13/20 0607 .  multivitamin with minerals tablet 1 tablet, 1 tablet, Oral, Daily, Regalado, Belkys A, MD, 1 tablet at 02/13/20 1450 .  ondansetron (ZOFRAN-ODT)  disintegrating tablet 4 mg, 4 mg, Oral, Q8H PRN, Cox, Amy N, DO, 4 mg at 02/13/20 0606 .  oxybutynin (DITROPAN-XL) 24 hr tablet 10 mg, 10 mg, Oral, Daily, Cox, Amy N, DO, 10 mg at 02/13/20 0951 .  pantoprazole (PROTONIX) EC tablet 40 mg, 40 mg, Oral, Daily, Cox, Amy N, DO, 40 mg at 02/13/20 0945 .  rosuvastatin (CRESTOR) tablet 5 mg, 5 mg, Oral, Daily, Cox, Amy N, DO, 5 mg at 02/13/20 0951 .  sodium bicarbonate 150 mEq in dextrose 5 % 1,000 mL infusion, , Intravenous, Continuous, Efraim Kaufmann, [facility], Last Rate: 75 mL/hr at 02/14/20 0502, New Bag at 02/14/20 0502 .  sulfaSALAzine (AZULFIDINE) EC tablet 2,000 mg, 2,000 mg, Oral, BID, Baron-Johnson, Alison, PA-C, 2,000 mg at 02/13/20 2116 .  tamsulosin (FLOMAX) capsule 0.4 mg, 0.4 mg, Oral, QPC supper, Cox, Amy N, DO, 0.4 mg at 02/13/20 1752  Facility-Administered Medications Ordered in Other Encounters:  .  0.9 %  sodium chloride infusion, , Intravenous, Continuous, Samule Life, Rudell Cobb, MD, Stopped at 05/16/13 1115:  . allopurinol  300 mg Oral Daily  . aspirin  81 mg Oral Daily  . budesonide  9 mg Oral Daily  . carvedilol  12.5 mg Oral BID WC  . citalopram  5 mg Oral Daily  . feeding supplement (ENSURE ENLIVE)  237 mL Oral TID BM  . heparin  5,000 Units Subcutaneous Q8H  . hydroxyurea  1,000 mg Oral BID WC  . insulin aspart  0-15 Units Subcutaneous TID AC & HS  . insulin glargine  20 Units Subcutaneous Daily  . multivitamin with minerals  1 tablet Oral Daily  . oxybutynin  10 mg Oral Daily  . pantoprazole  40 mg Oral Daily  . rosuvastatin  5 mg Oral Daily  . sulfaSALAzine  2,000 mg Oral BID  . tamsulosin  0.4 mg Oral QPC supper  :  Allergies  Allergen Reactions  . Bupropion Hives  . Cephalexin Diarrhea and Other (See Comments)    Caused C-diff, also   . Fluoxetine Itching  . Ibuprofen Itching  . Prednisone Itching and Other (See Comments)    Abdominal pain, also   . Temazepam Other (See Comments)    Dizziness   . Trazodone  And Nefazodone Other (See Comments)    Dizziness  . Fluoxetine Hcl Itching  . Prozac [Fluoxetine Hcl] Itching  :  Family History  Problem Relation Age of Onset  . Heart disease Maternal Grandmother        Pacemaker, MI  . Stroke Mother   . Other Father        Deceased, car fell on him  .  Diabetes Sister   . Hypertension Sister   :  Social History   Socioeconomic History  . Marital status: Married    Spouse name: Not on file  . Number of children: Not on file  . Years of education: Not on file  . Highest education level: Not on file  Occupational History  . Not on file  Tobacco Use  . Smoking status: Former Smoker    Packs/day: 1.00    Years: 44.00    Pack years: 44.00    Types: Cigarettes    Start date: 06/05/1968    Quit date: 04/05/2013    Years since quitting: 6.8  . Smokeless tobacco: Never Used  . Tobacco comment: quit in 2014  Vaping Use  . Vaping Use: Never used  Substance and Sexual Activity  . Alcohol use: Yes    Alcohol/week: 0.0 standard drinks    Comment: rare  . Drug use: No  . Sexual activity: Not Currently  Other Topics Concern  . Not on file  Social History Narrative   Lives at home with wife in a one story home.  Has no children.  Does not work.  Getting workman's comp.  Education: 4 years trade school.    Social Determinants of Health   Financial Resource Strain:   . Difficulty of Paying Living Expenses: Not on file  Food Insecurity:   . Worried About Charity fundraiser in the Last Year: Not on file  . Ran Out of Food in the Last Year: Not on file  Transportation Needs:   . Lack of Transportation (Medical): Not on file  . Lack of Transportation (Non-Medical): Not on file  Physical Activity:   . Days of Exercise per Week: Not on file  . Minutes of Exercise per Session: Not on file  Stress:   . Feeling of Stress : Not on file  Social Connections:   . Frequency of Communication with Friends and Family: Not on file  . Frequency of  Social Gatherings with Friends and Family: Not on file  . Attends Religious Services: Not on file  . Active Member of Clubs or Organizations: Not on file  . Attends Archivist Meetings: Not on file  . Marital Status: Not on file  Intimate Partner Violence:   . Fear of Current or Ex-Partner: Not on file  . Emotionally Abused: Not on file  . Physically Abused: Not on file  . Sexually Abused: Not on file  :  Review of Systems  Constitutional: Negative.   HENT: Negative.   Eyes: Negative.   Respiratory: Negative.   Cardiovascular: Negative.   Gastrointestinal: Positive for abdominal pain and diarrhea.  Genitourinary: Negative.   Musculoskeletal: Positive for back pain.  Skin: Negative.   Neurological: Positive for dizziness and weakness.  Endo/Heme/Allergies: Negative.      Exam:  This is an obese white male in no obvious distress.  Vital signs are temperature of 99.  Pulse 68.  Blood pressure 94/68.  Head and neck exam shows no scleral icterus.  There is no conjunctival inflammation.  He has no adenopathy in the neck.  Lungs are with decent breath sounds bilaterally.  Cardiac exam regular rate and rhythm.  Abdomen is obese but soft.  Bowel sounds are present.  He has some slight tenderness to palpation.  This is more so in the lower abdomen.  There is no obvious abdominal mass.  I cannot palpate his liver or spleen.  Extremities shows some chronic mild  edema in his legs.  Skin exam shows no rashes.  He has a couple scattered ecchymoses.  Neurological exam is nonfocal.  Patient Vitals for the past 24 hrs:  BP Temp Temp src Pulse Resp SpO2 Weight  02/14/20 0719 94/68 -- -- 67 -- 96 % --  02/14/20 0505 127/62 98.4 F (36.9 C) -- 70 16 98 % --  02/13/20 2106 (!) 100/56 98.4 F (36.9 C) -- 72 16 97 % 279 lb 1.7 oz (126.6 kg)  02/13/20 1634 (!) 105/48 98.5 F (36.9 C) Oral 69 16 95 % --  02/13/20 1000 (!) 103/43 97.6 F (36.4 C) Oral 89 16 93 % --     Recent Labs     02/12/20 2313 02/13/20 0320  WBC 11.3* 10.8*  HGB 13.8 13.7  HCT 41.7 40.4  PLT 913* 812*   Recent Labs    02/12/20 1841 02/12/20 1841 02/12/20 2313 02/13/20 0320  NA 133*  --   --  133*  K 3.1*  --   --  3.6  CL 105  --   --  105  CO2 16*  --   --  12*  GLUCOSE 126*  --   --  97  BUN 61*  --   --  60*  CREATININE 2.12*   < > 2.15* 2.09*  CALCIUM 8.4*  --   --  8.6*   < > = values in this interval not displayed.    Blood smear review: None  Pathology: None    Assessment and Plan: Mr. Wendling is a 69 year old white male.  He has polycythemia vera.  He is JAK2 positive.  We saw him 2 weeks ago and his platelet count really was baseline for him.  The fact that it went up with his diarrhea to me indicates that this is reactive.  It is starting to come back down.  He continues on the Hydrea.  I think any stress on his body is going to lead to a reactive thrombocytosis.  I just feel bad that he has diarrhea.  It does sound like it is getting better.  I know gastroenterology is doing a good job following him.  I would not change anything with his medications with the Hydrea.  I would just try to let the platelets equilibrate.  Hopefully, he will be able to go home soon.  We will continue his follow-up in the office as planned.  Lattie Haw, MD  [place] [phone]

## 2020-02-14 NOTE — Progress Notes (Signed)
PT Cancellation Note  Patient Details Name: William Villanueva MRN: [id number] DOB: 1951/03/21   Cancelled Treatment:     Order received for eval and treat. Patient is being transferred to the ICU 2nd to low B/P per nursing. Please re-order PT when appropriate.    Carney Living PT DPT  02/14/2020, 3:32 PM

## 2020-02-14 NOTE — Progress Notes (Signed)
Notified by patient's hospitalist Dr. Tyrell Antonio that patient is hypotensive and as per nurse had about 30 cc of liquid red stool.  Ongoing hypotension despite IV fluid bolus, as per documentation has received 500 mL lactated Ringer's x2, normal saline 1.5 L x 1, however remains hypotensive with a blood pressure of 86 /30 mmHg.  Since his abdominal examination was benign, no tenderness, normoactive bowel sounds, 1 bowel movement reported with me in the morning, recommend getting a CAT scan of the abdomen and pelvis to rule out retroperitoneal bleed.  His hemoglobin was 13.7 yesterday and is 10.5 today morning.

## 2020-02-14 NOTE — Progress Notes (Signed)
   02/14/20 1100  Clinical Encounter Type  Visited With Patient and family together  Visit Type Initial;Spiritual support  Referral From Nurse  Consult/Referral To Chaplain  Spiritual Encounters  Spiritual Needs Prayer;Emotional  The chaplain spoke with bedside visitor Fraser Din). She requested prayer and emotional support. The chaplain provided prayer. The chaplain listened and provided social support since the patient can only have one visitor. The patient said, "thank you for prayer."

## 2020-02-14 NOTE — Progress Notes (Signed)
PROGRESS NOTE    William Villanueva  FWY:[id number] DOB: [birth year] DOA: 02/12/2020 PCP: Antony Contras, MD   Brief Narrative: 69 year old with past medical history significant for polycythemia vera, hemochromatosis, Mobitz type II: Complete atrioventricular heart block and nonischemic cardiomyopathy with ejection fraction 35% status post CRT D implant, history of norovirus and CDF infection 2018 who presents complaining of nausea vomiting and diarrhea for 3 weeks. He  reports 6-8 episodes of watery stool. Decreased appetite and poor oral intake.  Patient admitted with AKI nausea vomiting and diarrhea and metabolic acidosis.   Assessment & Plan:   Principal Problem:   Gastroenteritis, acute Active Problems:   Polycythemia vera (HCC)   Back pain   AKI (acute kidney injury) ([facility])   Hypokalemia   Diarrhea   Generalized abdominal pain   Thrombocytosis   1-Diarrhea: History of ulcerative colitis; related to ulcerative flare.  -CT abdomen and pelvis without contrast without any evidence of acute intra-abdominal or pelvic abnormality. -C. difficile negative -GI pathogen negative,  -Report that his diarrhea started after he ran out of sulfasalazine. -GI consulted. Started on budesonide.  -He didn't had BM since yesterday night. Had BM this AM watery, large. Also had 30 cc bloody diarrhea this afternoon.    2-Hypotension, Syncope; Shock  Presume related to hypovolemia, coreg.  Getting 4th  L IV fluids.  Had 30 cc bloody stool, dark red.  Plan to cycle Hb/  Type and screen, check cbc, plan to transfuse if hb drops.  Discussed with GI, recommended CT abdomen -pelvis to rule out retroperitoneal bleed.  CCM informed of recurrent hypotension. Consider glucogan. Patient hr has been in the 80, BP 85.  Repeat IV bolus.  Plan to transfer to ICU. Blood transfusion.   Metabolic acidosis: AKI: Start bicarb drip Bicarb down to 12.  3-AKI; on CKD stage IIIa: Prior creatinine 1.2--1.4 Likely  secondary to prerenal from GI loss. Continue with IV fluids. Hold Entresto,. Spironolactone  4-Polycythemia vera Worsening increased platelet count could be reactive. Dr. Marin Olp informed of patient admission Continue with Hydrea.  Nonischemic cardiomyopathy status post CRT D Ejection fraction 30 to 35% Hold Entresto, spironolactone and diuretics Continue to monitor for pulmonary edema.   Hypokalemia/hypophosphatemia; repletion IV.   Hold the methotrexate  Nutrition Problem: Inadequate oral intake Etiology: poor appetite, nausea, vomiting    Signs/Symptoms: per patient/family report    Interventions: Ensure Enlive (each supplement provides 350kcal and 20 grams of protein), MVI  Estimated body mass index is 40.05 kg/m as calculated from the following:   Height as of this encounter: 5' 10"  (1.778 m).   Weight as of this encounter: 126.6 kg.   DVT prophylaxis: Heparin Code Status: Full code Family Communication: Wife updated at bedside.  Disposition Plan:  Status is: Inpatient  Remains inpatient appropriate because:Hemodynamically unstable   Dispo: The patient is from: Home              Anticipated d/c is to: Home              Anticipated d/c date is: 3 days              Patient currently is not medically stable to d/c.        Consultants:   GI  Cardiology  CCM  Procedures:   None  Antimicrobials:    Subjective: Patient had syncope episode this am (7;00)  while in the bathroom. He doesn't remember for how long he pass out, he was sitting in the toilet having  a bowel movement. He denies chest pain prior to episode.   Subsequently around 8;am his BP drop. To 50/30 then 70/50 1 L IV bolus ordered. BP increase to 90. Subsequently BP drop again 84 while getting IV fluids. Patient was place on trendelenburg position while he was hypotension.  He has been feeling dizziness, weakness.    Objective: Vitals:   02/14/20 1215 02/14/20 1229 02/14/20  1318 02/14/20 1345  BP: (!) 121/109 (!) 92/43 (!) 84/37 (!) 86/30  Pulse: 96 85 85   Resp:      Temp:      TempSrc:      SpO2: (!) 87% 99%    Weight:      Height:        Intake/Output Summary (Last 24 hours) at 02/14/2020 1408 Last data filed at 02/14/2020 0600 Gross per 24 hour  Intake 1454.42 ml  Output 1 ml  Net 1453.42 ml   Filed Weights   02/12/20 1817 02/12/20 2351 02/13/20 2106  Weight: 128.1 kg 126.6 kg 126.6 kg    Examination:  General exam: Pale, lethargic Respiratory system: CTA Cardiovascular system: S 1, S 2, RRR. Gastrointestinal system: BS present, soft, nt Central nervous system: generalized weak, pale,  Extremities: Symmetric 5 x 5 power.   Data Reviewed: I have personally reviewed following labs and imaging studies  CBC: Recent Labs  Lab 02/12/20 1841 02/12/20 2313 02/13/20 0320 02/14/20 0813  WBC 11.0* 11.3* 10.8* 10.5  NEUTROABS 8.9*  --   --   --   HGB 13.7 13.8 13.7 10.5*  HCT 41.0 41.7 40.4 32.0*  MCV 104.3* 104.3* 105.5* 105.3*  PLT 913* 913* 812* 680*   Basic Metabolic Panel: Recent Labs  Lab 02/12/20 1841 02/12/20 2118 02/12/20 2313 02/13/20 0320 02/14/20 0813  NA 133*  --   --  133* 136  K 3.1*  --   --  3.6 3.2*  CL 105  --   --  105 105  CO2 16*  --   --  12* 21*  GLUCOSE 126*  --   --  97 184*  BUN 61*  --   --  60* 46*  CREATININE 2.12*  --  2.15* 2.09* 2.01*  CALCIUM 8.4*  --   --  8.6* 8.0*  MG  --  1.6*  --   --  1.9  PHOS  --   --   --   --  2.4*   GFR: Estimated Creatinine Clearance: 47 mL/min (A) (by C-G formula based on SCr of 2.01 mg/dL (H)). Liver Function Tests: Recent Labs  Lab 02/12/20 1841 02/13/20 0320  AST 18 21  ALT 7 11  ALKPHOS 64 61  BILITOT 0.3 0.7  PROT 6.3* 6.3*  ALBUMIN 3.4* 3.2*   Recent Labs  Lab 02/12/20 1841  LIPASE 63*  AMYLASE 116*   No results for input(s): AMMONIA in the last 168 hours. Coagulation Profile: No results for input(s): INR, PROTIME in the last 168  hours. Cardiac Enzymes: No results for input(s): CKTOTAL, CKMB, CKMBINDEX, TROPONINI in the last 168 hours. BNP (last 3 results) Recent Labs    01/02/20 0859  PROBNP 2,894*   HbA1C: No results for input(s): HGBA1C in the last 72 hours. CBG: Recent Labs  Lab 02/13/20 0655 02/13/20 1141 02/13/20 1637 02/13/20 2107 02/14/20 0716  GLUCAP 119* 126* 151* 128* 183*   Lipid Profile: No results for input(s): CHOL, HDL, LDLCALC, TRIG, CHOLHDL, LDLDIRECT in the last 72 hours. Thyroid Function Tests: No  results for input(s): TSH, T4TOTAL, FREET4, T3FREE, THYROIDAB in the last 72 hours. Anemia Panel: No results for input(s): VITAMINB12, FOLATE, FERRITIN, TIBC, IRON, RETICCTPCT in the last 72 hours. Sepsis Labs: Recent Labs  Lab 02/12/20 1922 02/13/20 0320 02/14/20 1158  LATICACIDVEN 1.6 1.1 1.5    Recent Results (from the past 240 hour(s))  Respiratory Panel by RT PCR (Flu A&B, Covid) - Nasopharyngeal Swab     Status: None   Collection Time: 02/04/20  2:36 PM   Specimen: Nasopharyngeal Swab  Result Value Ref Range Status   SARS Coronavirus 2 by RT PCR NEGATIVE NEGATIVE Final    Comment: (NOTE) SARS-CoV-2 target nucleic acids are NOT DETECTED.  The SARS-CoV-2 RNA is generally detectable in upper respiratoy specimens during the acute phase of infection. The lowest concentration of SARS-CoV-2 viral copies this assay can detect is 131 copies/mL. A negative result does not preclude SARS-Cov-2 infection and should not be used as the sole basis for treatment or other patient management decisions. A negative result may occur with  improper specimen collection/handling, submission of specimen other than nasopharyngeal swab, presence of viral mutation(s) within the areas targeted by this assay, and inadequate number of viral copies (<131 copies/mL). A negative result must be combined with clinical observations, patient history, and epidemiological information. The expected result is  Negative.  Fact Sheet for Patients:  PinkCheek.be  Fact Sheet for Healthcare Providers:  GravelBags.it  This test is no t yet approved or cleared by the [place] FDA and  has been authorized for detection and/or diagnosis of SARS-CoV-2 by FDA under an Emergency Use Authorization (EUA). This EUA will remain  in effect (meaning this test can be used) for the duration of the COVID-19 declaration under Section 564(b)(1) of the Act, 21 U.S.C. section 360bbb-3(b)(1), unless the authorization is terminated or revoked sooner.     Influenza A by PCR NEGATIVE NEGATIVE Final   Influenza B by PCR NEGATIVE NEGATIVE Final    Comment: (NOTE) The Xpert Xpress SARS-CoV-2/FLU/RSV assay is intended as an aid in  the diagnosis of influenza from Nasopharyngeal swab specimens and  should not be used as a sole basis for treatment. Nasal washings and  aspirates are unacceptable for Xpert Xpress SARS-CoV-2/FLU/RSV  testing.  Fact Sheet for Patients: PinkCheek.be  Fact Sheet for Healthcare Providers: GravelBags.it  This test is not yet approved or cleared by the [place] FDA and  has been authorized for detection and/or diagnosis of SARS-CoV-2 by  FDA under an Emergency Use Authorization (EUA). This EUA will remain  in effect (meaning this test can be used) for the duration of the  Covid-19 declaration under Section 564(b)(1) of the Act, 21  U.S.C. section 360bbb-3(b)(1), unless the authorization is  terminated or revoked. Performed at [facility], [address] [place], [place] [address]   Gastrointestinal Panel by PCR , Stool     Status: None   Collection Time: 02/12/20  7:23 PM   Specimen: Stool  Result Value Ref Range Status   Campylobacter species NOT DETECTED NOT DETECTED Final   Plesimonas shigelloides NOT DETECTED NOT DETECTED Final   Salmonella  species NOT DETECTED NOT DETECTED Final   Yersinia enterocolitica NOT DETECTED NOT DETECTED Final   Vibrio species NOT DETECTED NOT DETECTED Final   Vibrio cholerae NOT DETECTED NOT DETECTED Final   Enteroaggregative E coli (EAEC) NOT DETECTED NOT DETECTED Final   Enteropathogenic E coli (EPEC) NOT DETECTED NOT DETECTED Final   Enterotoxigenic E coli (  ETEC) NOT DETECTED NOT DETECTED Final   Shiga like toxin producing E coli (STEC) NOT DETECTED NOT DETECTED Final   Shigella/Enteroinvasive E coli (EIEC) NOT DETECTED NOT DETECTED Final   Cryptosporidium NOT DETECTED NOT DETECTED Final   Cyclospora cayetanensis NOT DETECTED NOT DETECTED Final   Entamoeba histolytica NOT DETECTED NOT DETECTED Final   Giardia lamblia NOT DETECTED NOT DETECTED Final   Adenovirus F40/41 NOT DETECTED NOT DETECTED Final   Astrovirus NOT DETECTED NOT DETECTED Final   Norovirus GI/GII NOT DETECTED NOT DETECTED Final   Rotavirus A NOT DETECTED NOT DETECTED Final   Sapovirus (I, II, IV, and V) NOT DETECTED NOT DETECTED Final    Comment: Performed at [facility], [address] [place], [place] [address]  C Difficile Quick Screen w PCR reflex     Status: None   Collection Time: 02/12/20  7:23 PM   Specimen: Stool  Result Value Ref Range Status   C Diff antigen NEGATIVE NEGATIVE Final   C Diff toxin NEGATIVE NEGATIVE Final   C Diff interpretation No C. difficile detected.  Final    Comment: Performed at [facility], [place] [address] [place], [place] [address]  Respiratory Panel by RT PCR (Flu A&B, Covid) - Nasopharyngeal Swab     Status: None   Collection Time: 02/12/20 10:24 PM   Specimen: Nasopharyngeal Swab  Result Value Ref Range Status   SARS Coronavirus 2 by RT PCR NEGATIVE NEGATIVE Final    Comment: (NOTE) SARS-CoV-2 target nucleic acids are NOT DETECTED.  The SARS-CoV-2 RNA is generally detectable in upper respiratoy specimens during the acute phase of infection. The  lowest concentration of SARS-CoV-2 viral copies this assay can detect is 131 copies/mL. A negative result does not preclude SARS-Cov-2 infection and should not be used as the sole basis for treatment or other patient management decisions. A negative result may occur with  improper specimen collection/handling, submission of specimen other than nasopharyngeal swab, presence of viral mutation(s) within the areas targeted by this assay, and inadequate number of viral copies (<131 copies/mL). A negative result must be combined with clinical observations, patient history, and epidemiological information. The expected result is Negative.  Fact Sheet for Patients:  PinkCheek.be  Fact Sheet for Healthcare Providers:  GravelBags.it  This test is no t yet approved or cleared by the [place] FDA and  has been authorized for detection and/or diagnosis of SARS-CoV-2 by FDA under an Emergency Use Authorization (EUA). This EUA will remain  in effect (meaning this test can be used) for the duration of the COVID-19 declaration under Section 564(b)(1) of the Act, 21 U.S.C. section 360bbb-3(b)(1), unless the authorization is terminated or revoked sooner.     Influenza A by PCR NEGATIVE NEGATIVE Final   Influenza B by PCR NEGATIVE NEGATIVE Final    Comment: (NOTE) The Xpert Xpress SARS-CoV-2/FLU/RSV assay is intended as an aid in  the diagnosis of influenza from Nasopharyngeal swab specimens and  should not be used as a sole basis for treatment. Nasal washings and  aspirates are unacceptable for Xpert Xpress SARS-CoV-2/FLU/RSV  testing.  Fact Sheet for Patients: PinkCheek.be  Fact Sheet for Healthcare Providers: GravelBags.it  This test is not yet approved or cleared by the [place] FDA and  has been authorized for detection and/or diagnosis of SARS-CoV-2 by  FDA under  an Emergency Use Authorization (EUA). This EUA will remain  in effect (meaning this test can be used) for the duration of the  Covid-19 declaration under Section 564(b)(1) of the Act, 21  U.S.C. section 360bbb-3(b)(1), unless the authorization is  terminated or revoked. Performed at [facility], [place] [address], [place], [place] [address]          Radiology Studies: CT ABDOMEN PELVIS WO CONTRAST  Result Date: 02/12/2020 CLINICAL DATA:  Abdominal pain EXAM: CT ABDOMEN AND PELVIS WITHOUT CONTRAST TECHNIQUE: Multidetector CT imaging of the abdomen and pelvis was performed following the standard protocol without IV contrast. COMPARISON:  CT 02/04/2020 FINDINGS: Lower chest: Lung bases demonstrate no acute consolidation or effusion. Partially visualized intracardiac pacing leads. Hepatobiliary: No focal liver abnormality is seen. No gallstones, gallbladder wall thickening, or biliary dilatation. Pancreas: Unremarkable. No pancreatic ductal dilatation or surrounding inflammatory changes. Spleen: Enlarged with craniocaudal measurement of 15 cm. Adrenals/Urinary Tract: Adrenal glands are normal. No hydronephrosis. Low-density renal lesions likely cysts. Bladder is normal Stomach/Bowel: Stomach is within normal limits. Appendix not well seen but no right lower quadrant inflammatory process. Left colon diverticula without acute inflammatory change. No evidence of bowel wall thickening, distention, or inflammatory changes. Vascular/Lymphatic: Mild to moderate aortic atherosclerosis. No aneurysm. No suspicious adenopathy. Reproductive: Prostate calcification. Other: Negative for free air or free fluid. Musculoskeletal: Degenerative changes. No acute or suspicious osseous abnormality. IMPRESSION: 1. No CT evidence for acute intra-abdominal or pelvic abnormality. 2. Splenomegaly. 3. 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 Aortic Atherosclerosis (ICD10-I70.0). Electronically Signed    By: Donavan Foil M.D.   On: 02/12/2020 21:10        Scheduled Meds: . sodium chloride   Intravenous Once  . sodium chloride   Intravenous Once  . allopurinol  300 mg Oral Daily  . budesonide  9 mg Oral Daily  . citalopram  5 mg Oral Daily  . feeding supplement (ENSURE ENLIVE)  237 mL Oral TID BM  . hydroxyurea  1,000 mg Oral BID WC  . insulin aspart  0-15 Units Subcutaneous TID AC & HS  . insulin glargine  20 Units Subcutaneous Daily  . multivitamin with minerals  1 tablet Oral Daily  . oxybutynin  10 mg Oral Daily  . pantoprazole  40 mg Oral Daily  . rosuvastatin  5 mg Oral Daily  . sulfaSALAzine  2,000 mg Oral BID  . tamsulosin  0.4 mg Oral QPC supper   Continuous Infusions: . lactated ringers    . magnesium sulfate bolus IVPB 2 g (02/14/20 1328)  . potassium chloride    . potassium PHOSPHATE IVPB (in mmol)       LOS: 2 days    Time spent: 35 minutes    Jaylyne Breese A Cheron Coryell, MD [facility]   If 7PM-7AM, please contact night-coverage [email or web address]  02/14/2020, 2:08 PM

## 2020-02-14 NOTE — Plan of Care (Addendum)
Patient has had 3 large dark/ bloody BMs  Will call to notify [facility]- Dr. Therisa Doyne.  Current VS HR 105, BP 130/74, SpO2 98%  Julian Hy, DO 02/14/20 7:07 PM [facility]   Spoke to Dr.Karki- NM bleeding scan ordered & tech paged. Night CCM team updated.  Julian Hy, DO 02/14/20 7:34 PM [facility]

## 2020-02-14 NOTE — Consult Note (Signed)
ELECTROPHYSIOLOGY CONSULT NOTE  Patient ID: William Villanueva, MRN: [id number], DOB/AGE: 68-Jun-1952 68 y.o. Admit date: 02/12/2020 Date of Consult: 02/14/2020  Primary Physician: Antony Contras, MD Primary Cardiologist: SK Kazden [name] is a 69 y.o. male who is being seen today for the evaluation of hypotension and nonarrhytmic syncope at the request of Dr Tyrell Antonio  Chief Complaint: (As above)    HPI [name] is a 69 y.o. male admitted with NV and diarrhea and evidence of prerenal azotemia-- in the setting of ulcerative colitis  Seen by GI>> change in Rx with subsequent improvement in diarrhea 2/2.  Also hx of PVera Rx wt hydrea for assoc thrombocytosis, with thinkig that current elevation is reactive    I had seen in the office 10/4 at which time the NV and D was an issue with decreased po intake--encourage PO intake  But alas oral rehydration failed  Syncope today while up to go to BR   HR>> 120's but normal rhythm   Echo reevaluation 2/16 demonstrated near normalization of LV systolic function; he been readmitted at that time because of pain which dated back to his device implantation.   DATE TEST EF   2/16 Myoview  35%   3/17 Myoview    32 %   1/20 Echo  40-45%   9/21 Echo  30-35%    Date Bun/Cr K Hgb  8/21 23/1.24 4.1 14.4  02/14/20 46/2.01<<61/2.1   13.7  \   Past Medical History:  Diagnosis Date  . AV block, 2nd degree 2015   St. Jude Allure Quadra pulse generator [id number], model PM [id number]  . Back pain   . Cardiomyopathy ([place])    Nonischemic 45%.   . CHF (congestive heart failure) ([place])   . Gout   . Hemochromatosis   . Hypertension   . Hypospadias January 22, 1951   born with  . Nephrolithiasis   . 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 ANKLE SURGERY    . BI-VENTRICULAR PACEMAKER INSERTION N/A 04/29/2014   Procedure: BI-VENTRICULAR PACEMAKER INSERTION (CRT-P);  Surgeon: Deboraha Sprang, MD; Laterality: Left  St. Jude Allure Quadra pulse generator [phone], model PM [phone]  . PILONIDAL CYST EXCISION    . POSTERIOR LAMINECTOMY / DECOMPRESSION LUMBAR SPINE    . TONSILLECTOMY       Home Meds: Prior to Admission medications   Medication Sig Start Date End Date Taking? Authorizing Provider  allopurinol (ZYLOPRIM) 300 MG tablet Take 300 mg by mouth daily. 06/30/19  Yes [provider]  aspirin 81 MG tablet Take 81 mg by mouth at bedtime.    Yes [provider]  carvedilol (COREG) 12.5 MG tablet Take 1 tablet (12.5 mg total) by mouth 2 (two) times daily with a meal. 10/14/19  Yes Crenshaw, Denice Bors, MD  citalopram (CELEXA) 10 MG tablet Take 5 mg by mouth daily.  01/27/16  Yes [provider]  ENTRESTO 24-26 MG TAKE ONE TABLET BY MOUTH TWICE A DAY Patient taking differently: Take 1 tablet by mouth 2 (two) times daily.  07/09/19  Yes Lelon Perla, MD  folic acid (FOLVITE) 1 MG tablet Take 1 mg by mouth daily.   Yes [provider]  hydroxyurea (HYDREA) 500 MG capsule TAKE TWO CAPSULES BY MOUTH DAILY Patient taking differently: Take 500 mg by mouth in the morning and at bedtime.  09/09/19  Yes Volanda Napoleon, MD  insulin degludec (TRESIBA  FLEXTOUCH) 100 UNIT/ML SOPN FlexTouch Pen Inject 20 Units into the skin daily after breakfast.    Yes [provider]  methotrexate 2.5 MG tablet Take 12.5 mg by mouth See admin instructions. Take 12.5 mg by mouth in the morning and 12.5 mg in the evening ONLY ON TUESDAYS 11/07/19  Yes [provider]  omeprazole (PRILOSEC) 20 MG capsule Take 20 mg by mouth 2 (two) times daily before a meal.  07/05/16  Yes [provider]  oxybutynin (DITROPAN-XL) 10 MG 24 hr tablet Take 10 mg by mouth daily. 10/02/19  Yes [provider]  rosuvastatin (CRESTOR) 5 MG tablet Take 5 mg by mouth daily.   Yes [provider]  spironolactone (ALDACTONE) 25 MG tablet Take 0.5 tablets (12.5 mg  total) by mouth daily. 10/14/19  Yes Lelon Perla, MD  sulfaSALAzine (AZULFIDINE) 500 MG tablet Take 2,000 mg by mouth in the morning and at bedtime.  11/05/19  Yes [provider]  tamsulosin (FLOMAX) 0.4 MG CAPS capsule Take 0.4 mg by mouth 2 (two) times daily.  08/03/17  Yes [provider]  BD PEN NEEDLE NANO U/F 32G X 4 MM MISC  06/16/18   [provider]  febuxostat (ULORIC) 40 MG tablet Take 80 mg by mouth daily. Patient not taking: Reported on 02/12/2020    [provider]  furosemide (LASIX) 40 MG tablet Take 1 tablet (40 mg total) by mouth daily. 01/02/20 04/01/20  Chanetta Marshall K, NP  ondansetron (ZOFRAN-ODT) 4 MG disintegrating tablet Take 4 mg by mouth every 8 (eight) hours as needed for nausea or vomiting. Patient not taking: Reported on 02/12/2020    [provider]    Inpatient Medications:  . allopurinol  300 mg Oral Daily  . aspirin  81 mg Oral Daily  . atropine      . budesonide  9 mg Oral Daily  . citalopram  5 mg Oral Daily  . feeding supplement (ENSURE ENLIVE)  237 mL Oral TID BM  . heparin  5,000 Units Subcutaneous Q8H  . hydroxyurea  1,000 mg Oral BID WC  . insulin aspart  0-15 Units Subcutaneous TID AC & HS  . insulin glargine  20 Units Subcutaneous Daily  . multivitamin with minerals  1 tablet Oral Daily  . oxybutynin  10 mg Oral Daily  . pantoprazole  40 mg Oral Daily  . phosphorus  500 mg Oral BID  . rosuvastatin  5 mg Oral Daily  . sulfaSALAzine  2,000 mg Oral BID  . tamsulosin  0.4 mg Oral QPC supper      Allergies:  Allergies  Allergen Reactions  . Bupropion Hives  . Cephalexin Diarrhea and Other (See Comments)    Caused C-diff, also   . Fluoxetine Itching  . Ibuprofen Itching  . Prednisone Itching and Other (See Comments)    Abdominal pain, also   . Temazepam Other (See Comments)    Dizziness   . Trazodone And Nefazodone Other (See Comments)    Dizziness  . Fluoxetine Hcl Itching  . Prozac  [Fluoxetine Hcl] Itching    Social History   Socioeconomic History  . Marital status: Married    Spouse name: Not on file  . Number of children: Not on file  . Years of education: Not on file  . Highest education level: Not on file  Occupational History  . Not on file  Tobacco Use  . Smoking status: Former Smoker    Packs/day: 1.00  Years: 44.00    Pack years: 44.00    Types: Cigarettes    Start date: 06/05/1968    Quit date: 04/05/2013    Years since quitting: 6.8  . Smokeless tobacco: Never Used  . Tobacco comment: quit in 2014  Vaping Use  . Vaping Use: Never used  Substance and Sexual Activity  . Alcohol use: Yes    Alcohol/week: 0.0 standard drinks    Comment: rare  . Drug use: No  . Sexual activity: Not Currently  Other Topics Concern  . Not on file  Social History Narrative   Lives at home with wife in a one story home.  Has no children.  Does not work.  Getting workman's comp.  Education: 4 years trade school.    Social Determinants of Health   Financial Resource Strain:   . Difficulty of Paying Living Expenses: Not on file  Food Insecurity:   . Worried About Charity fundraiser in the Last Year: Not on file  . Ran Out of Food in the Last Year: Not on file  Transportation Needs:   . Lack of Transportation (Medical): Not on file  . Lack of Transportation (Non-Medical): Not on file  Physical Activity:   . Days of Exercise per Week: Not on file  . Minutes of Exercise per Session: Not on file  Stress:   . Feeling of Stress : Not on file  Social Connections:   . Frequency of Communication with Friends and Family: Not on file  . Frequency of Social Gatherings with Friends and Family: Not on file  . Attends Religious Services: Not on file  . Active Member of Clubs or Organizations: Not on file  . Attends Archivist Meetings: Not on file  . Marital Status: Not on file  Intimate Partner Violence:   . Fear of Current or Ex-Partner: Not on file  .  Emotionally Abused: Not on file  . Physically Abused: Not on file  . Sexually Abused: Not on file     Family History  Problem Relation Age of Onset  . Heart disease Maternal Grandmother        Pacemaker, MI  . Stroke Mother   . Other Father        Deceased, car fell on him  . Diabetes Sister   . Hypertension Sister      ROS:  Please see the history of present illness.     All other systems reviewed and negative.    Physical Exam:  Blood pressure (!) 92/43, pulse 85, temperature 98.7 F (37.1 C), temperature source Oral, resp. rate 19, height 5' 10"  (1.778 m), weight 126.6 kg, SpO2 99 %. General: Well developed, well nourished male in no acute distressbut washed out lying on his side and pale  Head: Normocephalic, atraumatic, sclera non-icteric, no xanthomas, nares are without discharge. EENT: normal Lymph Nodes:  none Back: without scoliosis/kyphosis*  no CVA tendersness Neck: Negative for carotid bruits.   Lungs: Clear bilaterally to auscultation without wheezes, rales, or rhonchi. Breathing is unlabored. Heart: RRR with S1 S2. 2/6 murmur , rubs, or gallops appreciated. Abdomen: Soft, non-tender, non-distended with normoactive bowel sounds. No hepatomegaly. No rebound/guarding. No obvious abdominal masses. Msk:  Strength and tone appear normal for age. Extremities: No clubbing or cyanosis. No  edema.  Distal pedal pulses are 2+ and equal bilaterally. Skin: Warm and Dry Neuro: Alert and oriented X 3. CN III-XII intact Grossly normal sensory and motor function . Psych:  Responds  to questions appropriately with a normal affect.      Labs: Cardiac Enzymes No results for input(s): CKTOTAL, CKMB, TROPONINI in the last 72 hours. CBC Lab Results  Component Value Date   WBC 10.5 02/14/2020   HGB 10.5 (L) 02/14/2020   HCT 32.0 (L) 02/14/2020   MCV 105.3 (H) 02/14/2020   PLT 868 (H) 02/14/2020   PROTIME: No results for input(s): LABPROT, INR in the last 72 hours. Chemistry    Recent Labs  Lab 02/13/20 0320 02/13/20 0320 02/14/20 0813  NA 133*   < > 136  K 3.6   < > 3.2*  CL 105   < > 105  CO2 12*   < > 21*  BUN 60*   < > 46*  CREATININE 2.09*   < > 2.01*  CALCIUM 8.6*   < > 8.0*  PROT 6.3*  --   --   BILITOT 0.7  --   --   ALKPHOS 61  --   --   ALT 11  --   --   AST 21  --   --   GLUCOSE 97   < > 184*   < > = values in this interval not displayed.   Lipids Lab Results  Component Value Date   CHOL 126 04/29/2014   HDL 29 (L) 04/29/2014   LDLCALC 68 04/29/2014   TRIG 146 04/29/2014   BNP NT-Pro BNP  Date/Time Value Ref Range Status  01/02/2020 08:59 AM 2,894 (H) 0 - 376 pg/mL Final    Comment:    The following cut-points have been suggested for the use of proBNP for the diagnostic evaluation of heart failure (HF) in patients with acute dyspnea: Modality                     Age           Optimal Cut                            (years)            Point ------------------------------------------------------ Diagnosis (rule in HF)        <50            450 pg/mL                           50 - 75            900 pg/mL                               >75           1800 pg/mL Exclusion (rule out HF)  Age independent     300 pg/mL    Thyroid Function Tests: No results for input(s): TSH, T4TOTAL, T3FREE, THYROIDAB in the last 72 hours.  Invalid input(s): FREET3    Miscellaneous Lab Results  Component Value Date   DDIMER 0.47 04/28/2014    Radiology/Studies:  CT ABDOMEN PELVIS WO CONTRAST  Result Date: 02/12/2020 CLINICAL DATA:  Abdominal pain EXAM: CT ABDOMEN AND PELVIS WITHOUT CONTRAST TECHNIQUE: Multidetector CT imaging of the abdomen and pelvis was performed following the standard protocol without IV contrast. COMPARISON:  CT 02/04/2020 FINDINGS: Lower chest: Lung bases demonstrate no acute consolidation or effusion. Partially visualized intracardiac pacing leads. Hepatobiliary: No focal liver abnormality is  seen. No gallstones,  gallbladder wall thickening, or biliary dilatation. Pancreas: Unremarkable. No pancreatic ductal dilatation or surrounding inflammatory changes. Spleen: Enlarged with craniocaudal measurement of 15 cm. Adrenals/Urinary Tract: Adrenal glands are normal. No hydronephrosis. Low-density renal lesions likely cysts. Bladder is normal Stomach/Bowel: Stomach is within normal limits. Appendix not well seen but no right lower quadrant inflammatory process. Left colon diverticula without acute inflammatory change. No evidence of bowel wall thickening, distention, or inflammatory changes. Vascular/Lymphatic: Mild to moderate aortic atherosclerosis. No aneurysm. No suspicious adenopathy. Reproductive: Prostate calcification. Other: Negative for free air or free fluid. Musculoskeletal: Degenerative changes. No acute or suspicious osseous abnormality. IMPRESSION: 1. No CT evidence for acute intra-abdominal or pelvic abnormality. 2. Splenomegaly. 3. 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 Aortic Atherosclerosis (ICD10-I70.0). Electronically Signed   By: Donavan Foil M.D.   On: 02/12/2020 21:10   CT ABDOMEN PELVIS W CONTRAST  Result Date: 02/04/2020 CLINICAL DATA:  Abdominal distension with elevated lipase. EXAM: CT ABDOMEN AND PELVIS WITH CONTRAST TECHNIQUE: Multidetector CT imaging of the abdomen and pelvis was performed using the standard protocol following bolus administration of intravenous contrast. CONTRAST:  148m OMNIPAQUE IOHEXOL 300 MG/ML  SOLN COMPARISON:  CT July 03, 2017 FINDINGS: Lower chest: No acute abnormality. Hepatobiliary: Hepatic steatosis. The liver is mildly enlarged, measuring up to 19 cm in craniocaudal dimension. No focal liver lesion identified. The gallbladder is nondistended. No gallbladder wall thickening or evidence of pericholecystic fluid. No radiodense gallstones. Pancreas: Similar fatty replacement of the pancreatic head. No peripancreatic fat stranding or fluid  collection. No pancreatic ductal dilation. Spleen: Mild splenomegaly, measuring up to 13.5 cm. No splenic mass. Adrenals/Urinary Tract: Normal appearance of the adrenal glands. No hydronephrosis. There are multiple bilateral low-attenuation renal lesions, which were better characterized on ultrasound from 07/01/2018 as cysts. Some of these lesions are too small to characterize by CT. Stomach/Bowel: Stomach is within normal limits. Appendix is not visualized. No evidence of bowel wall thickening, distention, or inflammatory changes. Colonic diverticulosis without evidence of diverticulitis. Vascular/Lymphatic: Calcific atherosclerosis of the aorta and its branch vessels. No significant vascular findings are present. No enlarged abdominal or pelvic lymph nodes. Reproductive: Nonspecific prostatic calcifications. Other: No abdominal wall hernia or abnormality. No abdominopelvic ascites. Musculoskeletal: No acute fracture identified. Multilevel degenerative changes of the lumbar spine. IMPRESSION: 1. No acute findings. No pancreatic inflammatory changes or peripancreatic fluid collection. 2. Mild hepatic steatosis and mild hepatosplenomegaly. Electronically Signed   By: FMargaretha SheffieldMD   On: 02/04/2020 20:09   DG Chest Portable 1 View  Result Date: 02/04/2020 CLINICAL DATA:  Nausea, vomiting, diarrhea and cough x3 weeks. EXAM: PORTABLE CHEST 1 VIEW COMPARISON:  June 27, 2019 FINDINGS: There is a dual lead AICD. There is no evidence of acute infiltrate, pleural effusion or pneumothorax. The heart size and mediastinal contours are within normal limits. A prominent pulmonary vessel is again seen along the infrahilar region on the right. The visualized skeletal structures are unremarkable. IMPRESSION: No active cardiopulmonary disease. Electronically Signed   By: TVirgina NorfolkM.D.   On: 02/04/2020 15:17   CUP PACEART INCLINIC DEVICE CHECK  Result Date: 02/09/2020 CRT-P device check in clinic. Normal  device function. Thresholds, sensing, impedance consistent with previous measurements. Histograms appropriate for patient and level of activity. 19 mode switches, appear AT contolled VR, longest 8 seconds. No ventricular high rate episodes. Patient bi-ventricularly pacing 97% of the time. Device programmed with appropriate safety margins. Device heart failure diagnostics elevating with CorVue trending  upwards. SK made aware. Estimated longevity 5.2 years. Patient enrolled in remote follow-up 02/23/20. Plan to check device remotely in 3 months and every 6 months in office. ROV w/ SK.Lavenia Atlas, BSN, RN   EKG: P-synchronous/ AV  pacing    Assessment and Plan:  Syncope non arrhythmic  Hypovolemia 2/2 diarrhea  Hypotension  Anemia--in setting of PVera  BRBPR today  Ulcerative colitis  Acute Kidney injury  NICM  CHF chronic systolic   Pts syncopal episode was on tele and was non arrhythmic and assoc with sinus tach, all consistent with hypovolemia and supported by his back to bed BP that was low.  No evidence for other causes of hypotension, not febrile--to suggest sepsis, and not cool to suggest cardiogenic  I agree with gentle rehydration na dhave put his ben in reverse trendelenburg to try to  Mitigate his bed rest.  I am perplexed by his hemoglobin being low in the setting of presumed dehydration wherein I would have expected hemoconcentration-- maybe the BRBPR is instructive, although Dr Sallee Provencal did not see it yesterday.        [name]

## 2020-02-14 NOTE — Significant Event (Signed)
Rapid Response Event Note   Reason for Call :  hypotension  Initial Focused Assessment:  Called from the RN stating that this patient was in the bathroom and had a near syncopal episode.  The RNs were able to get this patient back into bed safely, but his BP never returned to normal.  The patient appeared pale when I got into the room but he was alert.  Wife was at the bedside tearful.    When I checked back with the patient his RN stated that he had a bloody BM and his pressures were down again.  This patient is going to go for a CT of abdomen and will be transfered      Interventions:  Vitals taken, IVs placed, fluid bolus given  Plan of Care:  Patient is going to receive fluid bolus' and remain on unit   Event Summary:   MD Notified: MD at bedside, pccm consulted  Call Time: [place] Time: 0900 End Time: [place], RN

## 2020-02-14 NOTE — Progress Notes (Signed)
OT Cancellation Note  Patient Details Name: William Villanueva MRN: [id number] DOB: 1951-03-23   Cancelled Treatment:    Reason Eval/Treat Not Completed: Medical issues which prohibited therapy (t/f to ICU), will follow up for OT eval as pt appropriate.   Lou Cal, OT Acute Rehabilitation Services Pager [phone] Office [phone]   Raymondo Band 02/14/2020, 4:33 PM

## 2020-02-15 ENCOUNTER — Encounter (HOSPITAL_COMMUNITY): Payer: Self-pay | Admitting: Internal Medicine

## 2020-02-15 ENCOUNTER — Encounter (HOSPITAL_COMMUNITY)
Admission: EM | Disposition: A | Discharge status: Home Health | Payer: Self-pay | Source: Home / Self Care | Attending: Critical Care Medicine

## 2020-02-15 ENCOUNTER — Inpatient Hospital Stay (HOSPITAL_COMMUNITY): Payer: Medicare Other | Admitting: Anesthesiology

## 2020-02-15 DIAGNOSIS — D62 Acute posthemorrhagic anemia: Secondary | ICD-10-CM | POA: Diagnosis not present

## 2020-02-15 DIAGNOSIS — R55 Syncope and collapse: Secondary | ICD-10-CM | POA: Diagnosis not present

## 2020-02-15 DIAGNOSIS — N179 Acute kidney failure, unspecified: Secondary | ICD-10-CM | POA: Diagnosis not present

## 2020-02-15 DIAGNOSIS — R578 Other shock: Secondary | ICD-10-CM | POA: Diagnosis not present

## 2020-02-15 HISTORY — PX: ESOPHAGOGASTRODUODENOSCOPY (EGD) WITH PROPOFOL: SHX5813

## 2020-02-15 HISTORY — PX: COLONOSCOPY: SHX5424

## 2020-02-15 LAB — GLUCOSE, CAPILLARY
Glucose-Capillary: 116 mg/dL — ABNORMAL HIGH (ref 70–99)
Glucose-Capillary: 105 mg/dL — ABNORMAL HIGH (ref 70–99)
Glucose-Capillary: 87 mg/dL (ref 70–99)
Glucose-Capillary: 98 mg/dL (ref 70–99)
Glucose-Capillary: 117 mg/dL — ABNORMAL HIGH (ref 70–99)
Glucose-Capillary: 88 mg/dL (ref 70–99)

## 2020-02-15 LAB — CBC
HCT: 31 % — ABNORMAL LOW (ref 39.0–52.0)
HCT: 27.8 % — ABNORMAL LOW (ref 39.0–52.0)
Hemoglobin: 10.3 g/dL — ABNORMAL LOW (ref 13.0–17.0)
Hemoglobin: 9.2 g/dL — ABNORMAL LOW (ref 13.0–17.0)
MCH: 31.1 pg (ref 26.0–34.0)
MCH: 31.3 pg (ref 26.0–34.0)
MCHC: 33.2 g/dL (ref 30.0–36.0)
MCHC: 33.1 g/dL (ref 30.0–36.0)
MCV: 93.7 fL (ref 80.0–100.0)
MCV: 94.6 fL (ref 80.0–100.0)
Platelets: 817 10*3/uL — ABNORMAL HIGH (ref 150–400)
Platelets: 752 10*3/uL — ABNORMAL HIGH (ref 150–400)
RBC: 3.31 MIL/uL — ABNORMAL LOW (ref 4.22–5.81)
RBC: 2.94 MIL/uL — ABNORMAL LOW (ref 4.22–5.81)
RDW: 22.4 % — ABNORMAL HIGH (ref 11.5–15.5)
RDW: 23 % — ABNORMAL HIGH (ref 11.5–15.5)
WBC: 13.9 10*3/uL — ABNORMAL HIGH (ref 4.0–10.5)
WBC: 11.5 10*3/uL — ABNORMAL HIGH (ref 4.0–10.5)
nRBC: 0.4 % — ABNORMAL HIGH (ref 0.0–0.2)
nRBC: 0.3 % — ABNORMAL HIGH (ref 0.0–0.2)

## 2020-02-15 LAB — BASIC METABOLIC PANEL
Anion gap: 9 (ref 5–15)
BUN: 46 mg/dL — ABNORMAL HIGH (ref 8–23)
CO2: 19 mmol/L — ABNORMAL LOW (ref 22–32)
Calcium: 7.8 mg/dL — ABNORMAL LOW (ref 8.9–10.3)
Chloride: 112 mmol/L — ABNORMAL HIGH (ref 98–111)
Creatinine, Ser: 1.58 mg/dL — ABNORMAL HIGH (ref 0.61–1.24)
GFR, Estimated: 44 mL/min — ABNORMAL LOW (ref >60)
Glucose, Bld: 103 mg/dL — ABNORMAL HIGH (ref 70–99)
Potassium: 3.7 mmol/L (ref 3.5–5.1)
Sodium: 140 mmol/L (ref 135–145)

## 2020-02-15 LAB — PREPARE RBC (CROSSMATCH)

## 2020-02-15 SURGERY — ESOPHAGOGASTRODUODENOSCOPY (EGD) WITH PROPOFOL
Anesthesia: Monitor Anesthesia Care

## 2020-02-15 MED ORDER — ACETAMINOPHEN 10 MG/ML IV SOLN
1000.0000 mg | Freq: Four times a day (QID) | INTRAVENOUS | Status: AC | PRN
  Administered 2020-02-15: 1000 mg via INTRAVENOUS
  Filled 2020-02-15 (×2): qty 100

## 2020-02-15 MED ORDER — PHENYLEPHRINE HCL-NACL 10-0.9 MG/250ML-% IV SOLN
INTRAVENOUS | Status: DC | PRN
  Administered 2020-02-15: 25 ug/min via INTRAVENOUS

## 2020-02-15 MED ORDER — SODIUM CHLORIDE 0.9 % IV SOLN: INTRAVENOUS | Status: DC

## 2020-02-15 MED ORDER — SODIUM CHLORIDE 0.9% IV SOLUTION: Freq: Once | INTRAVENOUS | Status: AC

## 2020-02-15 MED ORDER — PROPOFOL 500 MG/50ML IV EMUL
INTRAVENOUS | Status: DC | PRN
  Administered 2020-02-15: 75 ug/kg/min via INTRAVENOUS

## 2020-02-15 MED ORDER — TAMSULOSIN HCL 0.4 MG PO CAPS
0.4000 mg | ORAL_CAPSULE | Freq: Every day | ORAL | Status: DC
  Administered 2020-02-15 – 2020-02-17 (×3): 0.4 mg via ORAL
  Filled 2020-02-15 (×3): qty 1

## 2020-02-15 SURGICAL SUPPLY — 15 items

## 2020-02-15 NOTE — Op Note (Signed)
[facility] Patient Name: William Villanueva Procedure Date : 02/15/2020 MRN: [id number] Attending MD: Ronnette Juniper , MD Date of Birth: 22-Apr-1951 CSN: [id number] Age: 69 Admit Type: Inpatient Procedure:                Colonoscopy Indications:              Hematochezia Providers:                Ronnette Juniper, MD, Baird Cancer, RN, Laverda Sorenson,                            Technician, Rejeana Brock, CRNA Referring MD:              Medicines:                Monitored Anesthesia Care Complications:            No immediate complications. Estimated Blood Loss:     Estimated blood loss: none from the procedure. But                            about 1100 cc of liquid dark blood loss noted. Procedure:                Pre-Anesthesia Assessment:                           - Prior to the procedure, a History and Physical                            was performed, and patient medications and                            allergies were reviewed. The patient's tolerance of                            previous anesthesia was also reviewed. The risks                            and benefits of the procedure and the sedation                            options and risks were discussed with the patient.                            All questions were answered, and informed consent                            was obtained. Prior Anticoagulants: The patient has                            taken no previous anticoagulant or antiplatelet                            agents except for aspirin. ASA Grade Assessment: IV                            -  A patient with severe systemic disease that is a                            constant threat to life. After reviewing the risks                            and benefits, the patient was deemed in                            satisfactory condition to undergo the procedure.                           After obtaining informed consent, the colonoscope                            was  passed under direct vision. Throughout the                            procedure, the patient's blood pressure, pulse, and                            oxygen saturations were monitored continuously. The                            [id number] ([id number] Olympus gastroscope was                            introduced through the anus and advanced to the the                            terminal ileum. After obtaining informed consent,                            the colonoscope was passed under direct vision.                            Throughout the procedure, the patient's blood                            pressure, pulse, and oxygen saturations were                            monitored continuously. The colonoscopy was                            performed without difficulty. The patient tolerated                            the procedure well. The quality of the bowel                            preparation was poor. Scope In: 1:27:11 PM Scope Out: 1:55:13 PM Scope Withdrawal Time: 0 hours 19 minutes 33 seconds  Total Procedure Duration: 0 hours 28  minutes 2 seconds  Findings:      Red liquid, dark blood was found in the entire colon.      The terminal ileum contained red liquid dark blood.      On lavage, the underlying mucosa appeared unremarkable.      There were areas of diverticulosis noted.      On lavage, there was no active bleeding noted.      300 ml of liquid dark blood was present in the rectal bag and an       additional 800 cc of liquid dark blood was suctioned from the colon. Impression:               - Preparation of the colon was poor.                           - Blood in the entire examined colon.                           - Blood in the terminal ileum.                           - No specimens collected. Recommendation:           - To visualize the small bowel, perform video                            capsule endoscopy today.                           - Transfuse 2 units PRBC.                            If he has continued hematochezia, he will need CTA                            and possible IR guided embolization. Procedure Code(s):        --- Professional ---                           [phone], Colonoscopy, flexible; diagnostic, including                            collection of specimen(s) by brushing or washing,                            when performed (separate procedure) Diagnosis Code(s):        --- Professional ---                           K92.2, Gastrointestinal hemorrhage, unspecified                           K92.1, Melena (includes Hematochezia) CPT copyright 2019 American Medical Association. All rights reserved. The codes documented in this report are preliminary and upon coder review may  be revised to meet current compliance requirements. Ronnette Juniper, MD 02/15/2020 2:18:19 PM This report has been signed electronically. Number of Addenda: 0

## 2020-02-15 NOTE — Interval H&P Note (Signed)
History and Physical Interval Note: 69/male with ulcerative colitis and diverticulosis with hematochezia, blood loss anemia, negative bleeding scan for an EGD and unprepared flexible sigmoidoscopy.  02/15/2020 12:57 PM  William Villanueva  has presented today for EGD and flexible sigmoidoscopy, with the diagnosis of rectal bleeding,ulcerative colitis.  The various methods of treatment have been discussed with the patient and family. After consideration of risks, benefits and other options for treatment, the patient has consented to  Procedure(s): ESOPHAGOGASTRODUODENOSCOPY (EGD) WITH PROPOFOL (N/A) FLEXIBLE SIGMOIDOSCOPY (N/A) as a surgical intervention.  The patient's history has been reviewed, patient examined, no change in status, stable for surgery.  I have reviewed the patient's chart and labs.  Questions were answered to the patient's satisfaction.     Ronnette Juniper

## 2020-02-15 NOTE — Anesthesia Preprocedure Evaluation (Signed)
Anesthesia Evaluation  Patient identified by MRN, date of birth, ID band Patient awake    Reviewed: Allergy & Precautions, NPO status , Patient's Chart, lab work & pertinent test results  Airway Mallampati: III  TM Distance: >3 FB Neck ROM: Full    Dental  (+) Teeth Intact, Dental Advisory Given   Pulmonary former smoker,    breath sounds clear to auscultation       Cardiovascular hypertension,  Rhythm:Regular Rate:Normal     Neuro/Psych    GI/Hepatic   Endo/Other    Renal/GU      Musculoskeletal   Abdominal (+) + obese,   Peds  Hematology   Anesthesia Other Findings   Reproductive/Obstetrics                             Anesthesia Physical Anesthesia Plan  ASA: III  Anesthesia Plan: MAC   Post-op Pain Management:    Induction: Intravenous  PONV Risk Score and Plan: Ondansetron and Propofol infusion  Airway Management Planned: Natural Airway and Nasal Cannula  Additional Equipment:   Intra-op Plan:   Post-operative Plan:   Informed Consent: I have reviewed the patients History and Physical, chart, labs and discussed the procedure including the risks, benefits and alternatives for the proposed anesthesia with the patient or authorized representative who has indicated his/her understanding and acceptance.     Dental advisory given  Plan Discussed with: CRNA and Anesthesiologist  Anesthesia Plan Comments:         Anesthesia Quick Evaluation

## 2020-02-15 NOTE — Progress Notes (Signed)
NAME:  William Villanueva, MRN:  [id number], DOB:  11/09/1950, LOS: 3 ADMISSION DATE:  02/12/2020, CONSULTATION DATE:  02/14/20 REFERRING MD:  Tyrell Antonio, CHIEF COMPLAINT:  shock  Brief History   69 yo admitted with gastroenteritis, hypovolemia. Hypotensive 10/9, somewhat fluid responsive initially, but becomes hypotensive again.  Has gotten 2L IVF. Hgb has dropped from 13 to 10 -- ordering 2 PRBC  History of present illness   69 yo M PMH UC (followed by Dr. 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 Jude, who was admitted to [facility] service 10/7 with CC n/v/d. Ongoing n/v/d x 3 weeks prior to ED presentation. No bloody BM or emesis.  Associated poor appetite and low PO intake, lethargy. Denied SOB fever chills, sick contacts.  In ED found to also have AKI, metabolic acidosis.  Pt has been gently administered fluids following admission. GI consulted -- found that pt has hx UC and these symptoms arose after pt had delay in acquiring Sulfasalazine.   10/9 pt with near syncope when using restroom, followed by hypotension. PCCM consulted, at time of PCCM eval, pt had begun IVF bolus and BP had normalized. PCCM called again when this recurred following antihypertensive medication administration, bloody loose stool. Hgb noted to be 10.5 from 13.7 prior. Pt has had 2L IVF resusc. PCCM ordering 2 PRBC and will transfer to ICU    Past Medical History  UC 2nd degree AV block NICM HTN Polycythemia vera Gout DM Significant Hospital Events   10/7 admitted to [facility] with diarrhea 10/8 GI consulted 10/9 Heme, PCCM consulted. Moved to ICU w hypotension + hgb drop from 14 to 10 to 7. 4 PRBC   Consults:  GI Heme PCCM Procedures:    Significant Diagnostic Tests:  10/9 CT a/p >  No RPB. Colon w non-formed stool. Colonic diverticulosis. Borderline splenomegaly. Hypoattenuation of cardiac blood pool.  10/9 NM GI blood loss> no evidence of  active GIB    Micro Data:  10/7 SARS Cov2> neg 10/7 GI panel> neg  10/7 CDiff > neg   Antimicrobials:    Interim history/subjective:  Multiple bloody BM but no active bleed on nuc med scan Desat when sleeping  hgb 10 after 4 PRBC yesterday  Objective   Blood pressure 129/68, pulse 95, temperature 97.7 F (36.5 C), resp. rate 14, height 5' 10"  (1.778 m), weight 132.3 kg, SpO2 100 %.        Intake/Output Summary (Last 24 hours) at 02/15/2020 1141 Last data filed at 02/15/2020 1100 Gross per 24 hour  Intake 2957.65 ml  Output 1200 ml  Net 1757.65 ml   Filed Weights   02/12/20 2351 02/13/20 2106 02/15/20 0630  Weight: 126.6 kg 126.6 kg 132.3 kg    Examination: General: WDWN obese M, supine in bed NAD  HENT: NCAT Pink mmm Anicteric sclera Lungs: Symmetrical chest expansion. No accessory use. CTA b Cardiovascular: paced rhythm. S1s2. Cap refill brisk. 2+ radial pulses Abdomen: round, soft ndnt + bowel sounds  Extremities: No obvious joint deformity, no cyanosis or clubbing. Symmetrical bulk and tone  Neuro: AAOx4 following commands, PERRL GU: defer  Skin: c/d/w without rash   Resolved Hospital Problem list     Assessment & Plan:   Shock, improved  -hypovolemic (initial hypovolemia 2/2 diarrhea, emesis) with component of blood loss (acute drop hgb) Anemia, suspected GIB -multiple bloody stools late in shift 10/9 -s/p 4 PRBC -- hgb 10/10 is 10  P -ICU monitoring -trend hgb,  transfuse as indicated -GI planning for endoscopy and flex sig  -protonix gtt   Diarrhea -- likely UC flare -sx began after pt ran out of UC med -GI panel, CDiff neg. P -GI has started on home sulfasalazine, added budesonide -supportive care  Hx OSA -doesn't use home CPAP as requires too much maintenance  -inpt has desatted when asleep P -may benefit from nocturnal BiPAP here -SpO2 goal > 92%  Presyncope- improved  -due to hypotension vs possible vagal (occurred during  BM) P -goal normotension  -Cardiology consulted by [facility]-- agree with Cards, suspect that presyncope is not r/t dysrhythmia but more likely hypotension   AKI, improving  -suspect pre-renal due to hypovolemia, hypotension. Is possible there is component of ATN  P -trend renal indices, I/O   Chronic systolic heart failure LVEF 30-35% 9/21 P -supportive care   Polycythemia Vera, hemachromatosis, thrombocytosis, (JAK2 pos) -Followed by Dr. Marin Olp  -on Hydrea at home, dc in setting of bleed.   Coagulopathy, mild  -INR 1.6 P -will check AM INR    Hypokalemia Hypomagnesemia -replace as needed   DM  -SSI  Best practice:  Diet: NPO Pain/Anxiety/Delirium protocol (if indicated): na VAP protocol (if indicated): na DVT prophylaxis: SCD GI prophylaxis: protonix  Glucose control:  SSI Mobility: BR Code Status: Full Family Communication: d/w pt and wife at bedside 10/10 Disposition: ICU   Labs   CBC: Recent Labs  Lab 02/12/20 1841 02/12/20 2313 02/14/20 0813 02/14/20 1441 02/14/20 1500 02/14/20 2113 02/15/20 0419  WBC 11.0*   < > 10.5 QUESTIONABLE RESULTS, RECOMMEND RECOLLECT TO VERIFY 11.5* 12.6* 13.9*  NEUTROABS 8.9*  --   --   --   --   --   --   HGB 13.7   < > 10.5* QUESTIONABLE RESULTS, RECOMMEND RECOLLECT TO VERIFY 7.0* 11.1* 10.3*  HCT 41.0   < > 32.0* QUESTIONABLE RESULTS, RECOMMEND RECOLLECT TO VERIFY 21.6* 33.0* 31.0*  MCV 104.3*   < > 105.3* QUESTIONABLE RESULTS, RECOMMEND RECOLLECT TO VERIFY 106.4* 94.3 93.7  PLT 913*   < > 868* QUESTIONABLE RESULTS, RECOMMEND RECOLLECT TO VERIFY 874* 787* 817*   < > = values in this interval not displayed.    Basic Metabolic Panel: Recent Labs  Lab 02/12/20 1841 02/12/20 2118 02/12/20 2313 02/13/20 0320 02/14/20 0813 02/14/20 1615  NA 133*  --   --  133* 136 137  K 3.1*  --   --  3.6 3.2* 4.3  CL 105  --   --  105 105 110  CO2 16*  --   --  12* 21* 18*  GLUCOSE 126*  --   --  97 184* 130*  BUN 61*  --   --   60* 46* 49*  CREATININE 2.12*  --  2.15* 2.09* 2.01* 1.60*  CALCIUM 8.4*  --   --  8.6* 8.0* 7.1*  MG  --  1.6*  --   --  1.9  --   PHOS  --   --   --   --  2.4*  --    GFR: Estimated Creatinine Clearance: 60.4 mL/min (A) (by C-G formula based on SCr of 1.6 mg/dL (H)). Recent Labs  Lab 02/12/20 1922 02/12/20 2313 02/13/20 0320 02/14/20 0813 02/14/20 1158 02/14/20 1441 02/14/20 1459 02/14/20 1500 02/14/20 2113 02/15/20 0419  WBC  --    < > 10.8*   < >  --  QUESTIONABLE RESULTS, RECOMMEND RECOLLECT TO VERIFY  --  11.5* 12.6* 13.9*  LATICACIDVEN 1.6  --  1.1  --  1.5  --  1.8  --   --   --    < > = values in this interval not displayed.    Liver Function Tests: Recent Labs  Lab 02/12/20 1841 02/13/20 0320  AST 18 21  ALT 7 11  ALKPHOS 64 61  BILITOT 0.3 0.7  PROT 6.3* 6.3*  ALBUMIN 3.4* 3.2*   Recent Labs  Lab 02/12/20 1841  LIPASE 63*  AMYLASE 116*   No results for input(s): AMMONIA in the last 168 hours.  ABG    Component Value Date/Time   PHART 7.368 01/15/2012 0820   PCO2ART 35.7 01/15/2012 0820   PO2ART 62.0 (L) 01/15/2012 0820   HCO3 23.5 01/15/2012 0828   TCO2 25 01/15/2012 0828   ACIDBASEDEF 2.0 01/15/2012 0828   O2SAT 65.4 04/29/2014 0343     Coagulation Profile: Recent Labs  Lab 02/14/20 1457  INR 1.6*    Cardiac Enzymes: No results for input(s): CKTOTAL, CKMB, CKMBINDEX, TROPONINI in the last 168 hours.  HbA1C: Hgb A1c MFr Bld  Date/Time Value Ref Range Status  04/28/2014 08:23 PM 6.3 (H) <5.7 % Final    Comment:    (NOTE)                                                                       According to the ADA Clinical Practice Recommendations for 2011, when HbA1c is used as a screening test:  >=6.5%   Diagnostic of Diabetes Mellitus           (if abnormal result is confirmed) 5.7-6.4%   Increased risk of developing Diabetes Mellitus References:Diagnosis and Classification of Diabetes Mellitus,Diabetes SXJD,5520,80(EMVVK  1):S62-S69 and Standards of Medical Care in         Diabetes - 2011,Diabetes PQAE,4975,30 (Suppl 1):S11-S61.     CBG: Recent Labs  Lab 02/14/20 1337 02/14/20 1638 02/14/20 2017 02/15/20 0439 02/15/20 0749  GLUCAP 123* 149* 146* 116* 105*   CRITICAL CARE Performed by: Cristal Generous   Total critical care time: 32 minutes  Critical care time was exclusive of separately billable procedures and treating other patients.  Critical care was necessary to treat or prevent imminent or life-threatening deterioration.  Critical care was time spent personally by me on the following activities: development of treatment plan with patient and/or surrogate as well as nursing, discussions with consultants, evaluation of patient's response to treatment, examination of patient, obtaining history from patient or surrogate, ordering and performing treatments and interventions, ordering and review of laboratory studies, ordering and review of radiographic studies, pulse oximetry and re-evaluation of patient's condition.   Eliseo Gum MSN, AGACNP-BC [place] [phone] If no answer, [phone] 02/15/2020, 11:41 AM

## 2020-02-15 NOTE — Progress Notes (Signed)
Patient was seen and examined at bedside in presence of his nurse.  He has been having liquid bloody stools, small but multiple episodes overnight.  Fortunately, he appears hemodynamically stable.  Current blood pressure and heart rate are within normal limits.  He has received 4 units of PRBC transfusion.  His hemoglobin today morning is 10.3.  Bleeding scan performed overnight did not show any evidence of active bleeding.  Patient has colonic diverticulosis without focal thickening or inflammation of the colon, and this could be a diverticular bleed.  Plan to proceed with an endoscopy and unprepped flex sigmoidoscopy today. Patient currently on Protonix 8 mg/h IV.  Discussed the same with the patient, he verbalized understanding and consents.

## 2020-02-15 NOTE — H&P (View-Only) (Signed)
Patient was seen and examined at bedside in presence of his nurse.  He has been having liquid bloody stools, small but multiple episodes overnight.  Fortunately, he appears hemodynamically stable.  Current blood pressure and heart rate are within normal limits.  He has received 4 units of PRBC transfusion.  His hemoglobin today morning is 10.3.  Bleeding scan performed overnight did not show any evidence of active bleeding.  Patient has colonic diverticulosis without focal thickening or inflammation of the colon, and this could be a diverticular bleed.  Plan to proceed with an endoscopy and unprepped flex sigmoidoscopy today. Patient currently on Protonix 8 mg/h IV.  Discussed the same with the patient, he verbalized understanding and consents.

## 2020-02-15 NOTE — Plan of Care (Signed)
  Problem: Health Behavior/Discharge Planning: Goal: Ability to manage health-related needs will improve Outcome: Progressing   Problem: Clinical Measurements: Goal: Ability to maintain clinical measurements within normal limits will improve Outcome: Progressing Goal: Will remain free from infection Outcome: Progressing Goal: Diagnostic test results will improve Outcome: Progressing Goal: Respiratory complications will improve Outcome: Progressing Goal: Cardiovascular complication will be avoided Outcome: Progressing   Problem: Activity: Goal: Risk for activity intolerance will decrease Outcome: Progressing   Problem: Coping: Goal: Level of anxiety will decrease Outcome: Progressing   Problem: Elimination: Goal: Will not experience complications related to bowel motility Outcome: Progressing Goal: Will not experience complications related to urinary retention Outcome: Progressing   Problem: Pain Managment: Goal: General experience of comfort will improve Outcome: Progressing   Problem: Safety: Goal: Ability to remain free from injury will improve Outcome: Progressing   Problem: Skin Integrity: Goal: Risk for impaired skin integrity will decrease Outcome: Progressing   Problem: Education: Goal: Ability to identify signs and symptoms of gastrointestinal bleeding will improve Outcome: Progressing   Problem: Fluid Volume: Goal: Will show no signs and symptoms of excessive bleeding Outcome: Progressing   Problem: Clinical Measurements: Goal: Complications related to the disease process, condition or treatment will be avoided or minimized Outcome: Progressing

## 2020-02-15 NOTE — Progress Notes (Signed)
Patient to Endo for EGD/Flex Sig. Transported with telemetry by Endo RN.

## 2020-02-15 NOTE — Progress Notes (Signed)
Patient returned to unit at 1455.

## 2020-02-15 NOTE — Progress Notes (Signed)
eLink Physician-Brief Progress Note Patient Name: William Villanueva DOB: 1950-05-12 MRN: [id number]   Date of Service  02/15/2020  HPI/Events of Note  Patient c/o abdominal pain, he is NPO. LFT's are within normal limits.  eICU Interventions  Tylenol 1 gm iv Q 6 hours PRN pain        [name] [name] 02/15/2020, 4:27 AM

## 2020-02-15 NOTE — Procedures (Signed)
EGD showed: Normal esophagus Widely patent Schatzki's ring. Erythematous gastric body, fundus and antrum. Erythematous duodenum. 2 cratered but clean-based duodenal ulcers. No evidence of active or recent bleeding.   Colonoscopy showed: Liquid old and red blood throughout the colon. Underlying mucosa on lavage did not show signs of inflammation. Intubation of the terminal ileum showed blood in terminal ileum, unclear if bleeding is from small bowel or if blood was at the terminal ileum from backwash. 300 cc of liquid blood was noted in rectal pouch, additional 800 cc of liquid blood was suctioned from the colon.  Recommendations: Small bowel PillCam. 2 units PRBC transfusion. If there is evidence of ongoing bleeding, recommend CT angiogram and possible IR guided embolization.

## 2020-02-15 NOTE — Plan of Care (Signed)

## 2020-02-15 NOTE — Transfer of Care (Signed)
Immediate Anesthesia Transfer of Care Note  Patient: William Villanueva  Procedure(s) Performed: ESOPHAGOGASTRODUODENOSCOPY (EGD) WITH PROPOFOL (N/A ) COLONOSCOPY (N/A )  Patient Location: PACU  Anesthesia Type:MAC  Level of Consciousness: awake and drowsy  Airway & Oxygen Therapy: Patient Spontanous Breathing and Patient connected to nasal cannula oxygen  Post-op Assessment: Report given to RN and Post -op Vital signs reviewed and stable  Post vital signs: Reviewed and stable  Last Vitals:  Vitals Value Taken Time  BP    Temp    Pulse    Resp    SpO2      Last Pain:  Vitals:   02/15/20 1256  TempSrc: Oral  PainSc: 4          Complications: No complications documented.

## 2020-02-15 NOTE — Brief Op Note (Signed)
02/12/2020 - 02/15/2020  2:06 PM  PATIENT:  William Villanueva  69 y.o. male  PRE-OPERATIVE DIAGNOSIS:  rectal bleeding,ulcerative colitis  POST-OPERATIVE DIAGNOSIS:  EGD- duodenal ulcers Colon- old blood, nothing fresh bleeding   PROCEDURE:  Procedure(s): ESOPHAGOGASTRODUODENOSCOPY (EGD) WITH PROPOFOL (N/A) COLONOSCOPY (N/A)  SURGEON:  Surgeon(s) and Role:    Ronnette Juniper, MD - Primary  PHYSICIAN ASSISTANT:   ASSISTANTS: Baird Cancer, RN, Laverda Sorenson, Tech   ANESTHESIA:   none  EBL:  0 mL   BLOOD ADMINISTERED:none  DRAINS: none   LOCAL MEDICATIONS USED:  NONE  SPECIMEN:  No Specimen  DISPOSITION OF SPECIMEN:  N/A  COUNTS:  YES  TOURNIQUET:  * No tourniquets in log *  DICTATION: .Dragon Dictation  PLAN OF CARE: Admit to inpatient   PATIENT DISPOSITION:  PACU - hemodynamically stable.   Delay start of Pharmacological VTE agent (>24hrs) due to surgical blood loss or risk of bleeding: yes

## 2020-02-15 NOTE — Progress Notes (Signed)
Progress Note  Patient Name: William Villanueva Date of Encounter: 02/15/2020  [facility] Cardiologist: sk   Patient Profile     69 y.o. male admitted with NV diarrhea in setting of ulcerative colitis with hypovolemia/prerenal azotemia Improved initially w/ increases sulfasalazine then developed hypotension with bloody stool hgb 13>>7 requiring 4U Tx; bleeding scan neg > for EGD and sigmoidoscopy    Subjective   Feels a little better this am No chest pain or shortness of breath   Inpatient Medications    Scheduled Meds: . sodium chloride   Intravenous Once  . sodium chloride   Intravenous Once  . allopurinol  300 mg Oral Daily  . budesonide  9 mg Oral Daily  . Chlorhexidine Gluconate Cloth  6 each Topical Daily  . citalopram  5 mg Oral Daily  . feeding supplement (ENSURE ENLIVE)  237 mL Oral TID BM  . insulin aspart  0-15 Units Subcutaneous Q4H  . multivitamin with minerals  1 tablet Oral Daily  . oxybutynin  10 mg Oral Daily  . rosuvastatin  5 mg Oral Daily  . sulfaSALAzine  2,000 mg Oral BID  . tamsulosin  0.4 mg Oral QPC supper   Continuous Infusions: . acetaminophen Stopped (02/15/20 0647)  . lactated ringers 100 mL/hr at 02/15/20 0800  . pantoprozole (PROTONIX) infusion 8 mg/hr (02/15/20 0800)   PRN Meds: acetaminophen, loperamide, ondansetron (ZOFRAN) IV   Vital Signs    Vitals:   02/15/20 0630 02/15/20 0700 02/15/20 0751 02/15/20 0800  BP:  130/63  128/66  Pulse:  87  89  Resp:  17  18  Temp:   97.7 F (36.5 C)   TempSrc:      SpO2:  95%  98%  Weight: 132.3 kg     Height:        Intake/Output Summary (Last 24 hours) at 02/15/2020 0917 Last data filed at 02/15/2020 0800 Gross per 24 hour  Intake 2630.97 ml  Output 1000 ml  Net 1630.97 ml   Last 3 Weights 02/15/2020 02/13/2020 02/12/2020  Weight (lbs) 291 lb 10.7 oz 279 lb 1.7 oz 279 lb 1.6 oz  Weight (kg) 132.3 kg 126.602 kg 126.6 kg      Telemetry    Sinus * - Personally Reviewed   ECG      - Personally Reviewed  Physical Exam    GEN: appears uncomfortable but without acute distress.   Neck: supple  Cardiac: RRR, no murmurs, rubs, or gallops.  Respiratory: Clear to auscultation bilaterally. GI: Soft, nontender, non-distended  MS: No edema; No deformity. Neuro:  Nonfocal  Psych: Normal affect   Labs    High Sensitivity Troponin:   Recent Labs  Lab 02/04/20 2118 02/12/20 1841 02/12/20 2118 02/14/20 0813 02/14/20 1158  TROPONINIHS 26* 35* 34* 37* 37*      Chemistry Recent Labs  Lab 02/12/20 1841 02/12/20 2313 02/13/20 0320 02/14/20 0813 02/14/20 1615  NA 133*  --  133* 136 137  K 3.1*  --  3.6 3.2* 4.3  CL 105  --  105 105 110  CO2 16*  --  12* 21* 18*  GLUCOSE 126*  --  97 184* 130*  BUN 61*  --  60* 46* 49*  CREATININE 2.12*   < > 2.09* 2.01* 1.60*  CALCIUM 8.4*  --  8.6* 8.0* 7.1*  PROT 6.3*  --  6.3*  --   --   ALBUMIN 3.4*  --  3.2*  --   --  AST 18  --  21  --   --   ALT 7  --  11  --   --   ALKPHOS 64  --  61  --   --   BILITOT 0.3  --  0.7  --   --   GFRNONAA 31*   < > 32* 33* 44*  ANIONGAP 12  --  16* 10 9   < > = values in this interval not displayed.     Hematology Recent Labs  Lab 02/14/20 1500 02/14/20 2113 02/15/20 0419  WBC 11.5* 12.6* 13.9*  RBC 2.03* 3.50*  3.47* 3.31*  HGB 7.0* 11.1* 10.3*  HCT 21.6* 33.0* 31.0*  MCV 106.4* 94.3 93.7  MCH 34.5* 31.7 31.1  MCHC 32.4 33.6 33.2  RDW 19.4* 21.2* 22.4*  PLT 874* 787* 817*    BNP Recent Labs  Lab 02/12/20 1853  BNP 65.0     DDimer No results for input(s): DDIMER in the last 168 hours.   Radiology    CT ABDOMEN PELVIS WO CONTRAST  Result Date: 02/14/2020 CLINICAL DATA:  GI bleed, sudden drop in hemoglobin 14-10, concern for retroperitoneal hemorrhage EXAM: CT ABDOMEN AND PELVIS WITHOUT CONTRAST TECHNIQUE: Multidetector CT imaging of the abdomen and pelvis was performed following the standard protocol without IV contrast. COMPARISON:  CT 02/12/2020  FINDINGS: Lower chest: Some increasing atelectatic changes in the left lung base. Normal cardiac size. Pacer leads seen terminating in the right atrium, right ventricular apex, and coronary sinus. Hypoattenuation of the cardiac blood pool compatible with anemia. No pericardial effusion. Hepatobiliary: No visible focal liver lesion on this unenhanced CT. Normal liver attenuation with smooth surface contour. Some layering attenuation within the gallbladder may reflect biliary sludge. No pericholecystic fluid or inflammation. No biliary ductal dilatation or calcified gallstone. Pancreas: Partial fatty replacement of the pancreas. No pancreatic ductal dilatation or surrounding inflammatory changes. Spleen: Borderline splenomegaly.  No concerning splenic lesions. Adrenals/Urinary Tract: Normal adrenal glands. Kidneys are symmetric in size and normally located. Mild symmetric bilateral perinephric stranding, a nonspecific finding which may correlate with advanced age or decreased renal function. Several fluid attenuation cysts seen in the bilateral kidneys, unchanged from comparison. No concerning renal mass, urolithiasis or hydronephrosis. Urinary bladder is unremarkable aside from indentation of the bladder base by an enlarged prostate. Stomach/Bowel: Distal esophagus, stomach and duodenal sweep are unremarkable. No small bowel wall thickening or dilatation. No evidence of obstruction. Much of the colon appears fluid-filled with lack of formed stool compatible with a diarrheal illness. Scattered colonic diverticula but without focal diverticular inflammation to suggest acute diverticulitis. A normal appendix is visualized. Vascular/Lymphatic: Atherosclerotic calcifications within the abdominal aorta and branch vessels. No aneurysm or ectasia. No enlarged abdominopelvic lymph nodes. Reproductive: Slight median lobe hypertrophy with indentation of the bladder base. Few coarse likely benign calcifications within the  otherwise unremarkable prostate. Seminal vesicles are unremarkable. Other: No retroperitoneal or body wall hematoma. No free abdominopelvic air or fluid. No worrisome adenopathy. Foci of subcutaneous fat stranding along the low anterior abdominal wall likely related to injectable use. Musculoskeletal: No acute osseous abnormality or suspicious osseous lesion. Multilevel degenerative changes are present in the imaged portions of the spine. Multilevel flowing anterior osteophytosis, compatible with features of diffuse idiopathic skeletal hyperostosis (DISH). Additional moderate degenerative changes in the bilateral hips with enthesopathic changes about the pelvis and proximal femora. No large intramuscular hematoma. IMPRESSION: 1. No retroperitoneal hematoma or body wall hematoma. 2. Much of the colon appears fluid-filled with  lack of formed stool compatible with a diarrheal illness or rapid transit state which can be seen in the setting of GI bleed. 3. Colonic diverticulosis without evidence of acute diverticulitis. No other focal bowel thickening or inflammation. 4. Mild symmetric bilateral perinephric stranding, a nonspecific finding which may correlate with advanced age or decreased renal function. 5. Borderline splenomegaly. 6. Aortic Atherosclerosis (ICD10-I70.0). 7. Hypoattenuation of the cardiac blood pool suggestive of anemia. Electronically Signed   By: Lovena Le M.D.   On: 02/14/2020 16:43   NM GI Blood Loss  Result Date: 02/15/2020 CLINICAL DATA:  GI bleed. EXAM: NUCLEAR MEDICINE GASTROINTESTINAL BLEEDING SCAN TECHNIQUE: Sequential abdominal images were obtained following intravenous administration of Tc-6mlabeled red blood cells. RADIOPHARMACEUTICALS:  24 mCi Tc-926mertechnetate in-vitro labeled red cells. COMPARISON:  CT 1 day prior FINDINGS: There is no active gastrointestinal bleeding identified on this study. Normal radiotracer distribution is noted. IMPRESSION: No evidence for active GI  bleeding. Electronically Signed   By: ChConstance Holster.D.   On: 02/15/2020 01:55    Cardiac Studies       Assessment & Plan    Syncope non arrhythmic  Hypovolemia 2/2 diarrhea  Hypotension  Anemia--in setting of PVera  BRBPR today  Ulcerative colitis  Acute Kidney injury  NICM  CHF chronic systolic   Euvolemic continue current meds  GI bleeding under evaluation   Will follow       For questions or updates, please contact [facility] Please consult [email or web address] for contact info under        Signed, StVirl AxeMD  02/15/2020, 9:17 AM

## 2020-02-15 NOTE — Op Note (Signed)
[facility] Patient Name: William Villanueva Procedure Date : 02/15/2020 MRN: [id number] Attending MD: Ronnette Juniper , MD Date of Birth: March 08, 1951 CSN: [id number] Age: 69 Admit Type: Inpatient Procedure:                Upper GI endoscopy Indications:              Acute post hemorrhagic anemia, Hematochezia Providers:                Ronnette Juniper, MD, Baird Cancer, RN, Laverda Sorenson,                            Technician, Rejeana Brock, CRNA Referring MD:             Critical Care Medicines:                Monitored Anesthesia Care Complications:            No immediate complications. Estimated Blood Loss:     Estimated blood loss: none. Procedure:                Pre-Anesthesia Assessment:                           - Prior to the procedure, a History and Physical                            was performed, and patient medications and                            allergies were reviewed. The patient's tolerance of                            previous anesthesia was also reviewed. The risks                            and benefits of the procedure and the sedation                            options and risks were discussed with the patient.                            All questions were answered, and informed consent                            was obtained. Prior Anticoagulants: The patient has                            taken no previous anticoagulant or antiplatelet                            agents except for aspirin. ASA Grade Assessment: IV                            - A patient with severe systemic disease that is a  constant threat to life. After reviewing the risks                            and benefits, the patient was deemed in                            satisfactory condition to undergo the procedure.                           After obtaining informed consent, the endoscope was                            passed under direct vision. Throughout the                             procedure, the patient's blood pressure, pulse, and                            oxygen saturations were monitored continuously. The                            [id number]-[id number] ([id number]) Olympus gastroscope was                            introduced through the mouth, and advanced to the                            second part of duodenum. The upper GI endoscopy was                            accomplished without difficulty. The patient                            tolerated the procedure well. Scope In: Scope Out: Findings:      The examined esophagus was normal.      A widely patent Schatzki ring was found at the gastroesophageal junction.      A 2 cm hiatal hernia was present.      Diffuse mildly erythematous mucosa without bleeding was found in the       gastric fundus, in the gastric body and in the gastric antrum.      Patchy moderately erythematous mucosa without active bleeding and with       no stigmata of bleeding was found in the duodenal bulb and in the first       portion of the duodenum.      Two non-bleeding cratered duodenal ulcers with a clean ulcer base       (Forrest Class III) were found in the first portion of the duodenum. The       largest lesion was 7 mm in largest dimension. Impression:               - Normal esophagus.                           - Widely patent Schatzki ring.                           -  2 cm hiatal hernia.                           - Erythematous mucosa in the gastric fundus,                            gastric body and antrum.                           - Erythematous duodenopathy.                           - Non-bleeding duodenal ulcers with a clean ulcer                            base (Forrest Class III).                           - No specimens collected. Moderate Sedation:      Patient did not receive moderate sedation for this procedure, but       instead received monitored anesthesia care. Recommendation:           - NPO.                            - Continue present medications.                           - Perform a colonoscopy today. Procedure Code(s):        --- Professional ---                           [id number], Esophagogastroduodenoscopy, flexible,                            transoral; diagnostic, including collection of                            specimen(s) by brushing or washing, when performed                            (separate procedure) Diagnosis Code(s):        --- Professional ---                           K22.2, Esophageal obstruction                           K44.9, Diaphragmatic hernia without obstruction or                            gangrene                           K31.89, Other diseases of stomach and duodenum                           K26.9, Duodenal ulcer, unspecified as acute or  chronic, without hemorrhage or perforation                           D62, Acute posthemorrhagic anemia                           K92.1, Melena (includes Hematochezia) CPT copyright 2019 American Medical Association. All rights reserved. The codes documented in this report are preliminary and upon coder review may  be revised to meet current compliance requirements. Ronnette Juniper, MD 02/15/2020 2:12:52 PM This report has been signed electronically. Number of Addenda: 0

## 2020-02-16 ENCOUNTER — Inpatient Hospital Stay (HOSPITAL_COMMUNITY): Payer: Medicare Other

## 2020-02-16 DIAGNOSIS — R197 Diarrhea, unspecified: Secondary | ICD-10-CM | POA: Diagnosis not present

## 2020-02-16 DIAGNOSIS — K529 Noninfective gastroenteritis and colitis, unspecified: Secondary | ICD-10-CM | POA: Diagnosis not present

## 2020-02-16 DIAGNOSIS — N179 Acute kidney failure, unspecified: Secondary | ICD-10-CM | POA: Diagnosis not present

## 2020-02-16 LAB — CBC
HCT: 29.2 % — ABNORMAL LOW (ref 39.0–52.0)
HCT: 29.1 % — ABNORMAL LOW (ref 39.0–52.0)
Hemoglobin: 9.8 g/dL — ABNORMAL LOW (ref 13.0–17.0)
Hemoglobin: 9.8 g/dL — ABNORMAL LOW (ref 13.0–17.0)
MCH: 31.4 pg (ref 26.0–34.0)
MCH: 31.1 pg (ref 26.0–34.0)
MCHC: 33.6 g/dL (ref 30.0–36.0)
MCHC: 33.7 g/dL (ref 30.0–36.0)
MCV: 93.6 fL (ref 80.0–100.0)
MCV: 92.4 fL (ref 80.0–100.0)
Platelets: 583 10*3/uL — ABNORMAL HIGH (ref 150–400)
Platelets: 581 10*3/uL — ABNORMAL HIGH (ref 150–400)
RBC: 3.12 MIL/uL — ABNORMAL LOW (ref 4.22–5.81)
RBC: 3.15 MIL/uL — ABNORMAL LOW (ref 4.22–5.81)
RDW: 21.7 % — ABNORMAL HIGH (ref 11.5–15.5)
RDW: 22 % — ABNORMAL HIGH (ref 11.5–15.5)
WBC: 9.1 10*3/uL (ref 4.0–10.5)
WBC: 9.1 10*3/uL (ref 4.0–10.5)
nRBC: 0.4 % — ABNORMAL HIGH (ref 0.0–0.2)
nRBC: 0.4 % — ABNORMAL HIGH (ref 0.0–0.2)

## 2020-02-16 LAB — TYPE AND SCREEN
ABO/RH(D): A POS
Antibody Screen: NEGATIVE
Unit division: 0
Unit division: 0
Unit division: 0
Unit division: 0
Unit division: 0
Unit division: 0

## 2020-02-16 LAB — BPAM RBC
Blood Product Expiration Date: 202111022359
Blood Product Expiration Date: 202111022359
Blood Product Expiration Date: 202111022359
Blood Product Expiration Date: 202111022359
Blood Product Expiration Date: 202111052359
Blood Product Expiration Date: 202111052359
ISSUE DATE / TIME: 202110091414
ISSUE DATE / TIME: 202110091414
ISSUE DATE / TIME: 202110091648
ISSUE DATE / TIME: 202110091648
ISSUE DATE / TIME: 202110101650
ISSUE DATE / TIME: 202110102223
Unit Type and Rh: 5100
Unit Type and Rh: 5100
Unit Type and Rh: 6200
Unit Type and Rh: 6200
Unit Type and Rh: 6200
Unit Type and Rh: 6200

## 2020-02-16 LAB — GLUCOSE, CAPILLARY
Glucose-Capillary: 75 mg/dL (ref 70–99)
Glucose-Capillary: 96 mg/dL (ref 70–99)
Glucose-Capillary: 114 mg/dL — ABNORMAL HIGH (ref 70–99)
Glucose-Capillary: 93 mg/dL (ref 70–99)
Glucose-Capillary: 106 mg/dL — ABNORMAL HIGH (ref 70–99)
Glucose-Capillary: 103 mg/dL — ABNORMAL HIGH (ref 70–99)

## 2020-02-16 LAB — BASIC METABOLIC PANEL
Anion gap: 9 (ref 5–15)
BUN: 31 mg/dL — ABNORMAL HIGH (ref 8–23)
CO2: 20 mmol/L — ABNORMAL LOW (ref 22–32)
Calcium: 7.7 mg/dL — ABNORMAL LOW (ref 8.9–10.3)
Chloride: 112 mmol/L — ABNORMAL HIGH (ref 98–111)
Creatinine, Ser: 1.21 mg/dL (ref 0.61–1.24)
GFR, Estimated: >60 mL/min (ref >60)
Glucose, Bld: 89 mg/dL (ref 70–99)
Potassium: 3.5 mmol/L (ref 3.5–5.1)
Sodium: 141 mmol/L (ref 135–145)

## 2020-02-16 LAB — PROTIME-INR
INR: 1.3 — ABNORMAL HIGH (ref 0.8–1.2)
Prothrombin Time: 15.8 seconds — ABNORMAL HIGH (ref 11.4–15.2)

## 2020-02-16 LAB — MRSA PCR SCREENING: MRSA by PCR: NEGATIVE

## 2020-02-16 MED ORDER — FOLIC ACID 1 MG PO TABS
2.0000 mg | ORAL_TABLET | Freq: Every day | ORAL | Status: DC
  Administered 2020-02-16 – 2020-02-18 (×3): 2 mg via ORAL
  Filled 2020-02-16 (×3): qty 2

## 2020-02-16 MED ORDER — PANTOPRAZOLE SODIUM 40 MG IV SOLR
40.0000 mg | Freq: Two times a day (BID) | INTRAVENOUS | Status: DC
  Administered 2020-02-16 – 2020-02-18 (×5): 40 mg via INTRAVENOUS
  Filled 2020-02-16 (×5): qty 40

## 2020-02-16 MED ORDER — INSULIN ASPART 100 UNIT/ML ~~LOC~~ SOLN
0.0000 [IU] | Freq: Three times a day (TID) | SUBCUTANEOUS | Status: DC
  Administered 2020-02-17: 3 [IU] via SUBCUTANEOUS

## 2020-02-16 MED ORDER — SODIUM CHLORIDE 0.9 % IV SOLN: INTRAVENOUS | Status: DC

## 2020-02-16 MED ORDER — CALCIUM GLUCONATE-NACL 1-0.675 GM/50ML-% IV SOLN
1.0000 g | Freq: Once | INTRAVENOUS | Status: AC
  Administered 2020-02-16: 1000 mg via INTRAVENOUS
  Filled 2020-02-16: qty 50

## 2020-02-16 MED ORDER — GERHARDT'S BUTT CREAM
TOPICAL_CREAM | Freq: Every day | CUTANEOUS | Status: DC
  Filled 2020-02-16: qty 1

## 2020-02-16 NOTE — Progress Notes (Signed)
OT Cancellation Note  Patient Details Name: William Villanueva MRN: [id number] DOB: 16-Sep-1950   Cancelled Treatment:    Reason Eval/Treat Not Completed: Medical issues which prohibited therapy (pt with constant diarrhea per RN, will follow)  Malka So 02/16/2020, 12:34 PM  Nestor Lewandowsky, OTR/L Acute Rehabilitation Services Pager: [phone] Office: [phone]

## 2020-02-16 NOTE — Progress Notes (Signed)
Telemetry reviewed, SR/V pacing, occ PVCs, couplets VSS S/p 6u PRBC, H/H stable from yesterday Monitor closely for volume OL  No other/new recommendations from an EP perspective.  D/w Dr. Caryl Comes We will sign off though remain available, please recall if needed.  Tommye Standard, PA-C

## 2020-02-16 NOTE — Progress Notes (Signed)
NAME:  William Villanueva, MRN:  [id number], DOB:  July 26, 1950, LOS: 4 ADMISSION DATE:  02/12/2020, CONSULTATION DATE:  02/14/20 REFERRING MD:  Tyrell Antonio, CHIEF COMPLAINT:  shock  Brief History   69 yo admitted with gastroenteritis, hypovolemia. Hypotensive 10/9, somewhat fluid responsive initially, but becomes hypotensive again.  Has gotten 2L IVF. Hgb has dropped from 13 to 10 -- ordering 2 PRBC  History of present illness   69 yo M PMH UC (followed by Dr. 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 Ongoing n/v/d x 3 weeks prior to ED presentation. No bloody BM or emesis.  Associated poor appetite and low PO intake, lethargy. Denied SOB fever chills, sick contacts.  In ED found to also have AKI, metabolic acidosis.  Pt has been gently administered fluids following admission. GI consulted -- found that pt has hx UC and these symptoms arose after pt had delay in acquiring Sulfasalazine.   10/9 pt with near syncope when using restroom, followed by hypotension. PCCM consulted, at time of PCCM eval, pt had begun IVF bolus and BP had normalized. PCCM called again when this recurred following antihypertensive medication administration, bloody loose stool. Hgb noted to be 10.5 from 13.7 prior. Pt has had 2L IVF resusc. PCCM ordering 2 PRBC and will transfer to ICU   Past Medical History  UC 2nd degree AV block NICM HTN Polycythemia vera Gout DM Significant Hospital Events   10/7 admitted to [facility] with diarrhea 10/8 GI consulted 10/9 Heme, PCCM consulted. Moved to ICU w hypotension + hgb drop from 14 to 10 to 7. 4 PRBC   Consults:  GI, Heme, PCCM  Procedures:  EGD 10/10 >> Widely patent Schatzki Ring; 2 cm hiatal hernia; Diffuse mildly erythematous mucous without bleeding in the gastric fundus, body and antrum; patchy moderately erythematous mucosa in the duodenal bulb  without stigmata of recent bleeding, two non-bleeding cratered duodenal ulcers with clean base.   Colonoscopy 10/10 >> Red liquid, dark blood found throughout the colon and terminal ileum, underlying mucosa unremarkable. 800 cc of dark blood suctioned from colon.   Significant Diagnostic Tests:  10/9 CT a/p >  No RPB. Colon w non-formed stool. Colonic diverticulosis. Borderline splenomegaly. Hypoattenuation of cardiac blood pool.   10/9 NM GI blood loss > no evidence of active GIB   Micro Data:  10/7 SARS Cov2> neg 10/7 GI panel> neg  10/7 CDiff > neg   Antimicrobials:  None   Interim history/subjective:   [name] received additional 2u of pRBCs overnight. No other overnight events.   Patient states he feels fine this morning. He has having some generalized abdominal tenderness that is non-specific but denies any additional bowel movements or emesis. He denies any chest pain, SOB, or dizziness.   Objective   Blood pressure (!) 103/56, pulse 84, temperature 97.7 F (36.5 C), temperature source Oral, resp. rate 15, height 5' 10"  (1.778 m), weight 132.3 kg, SpO2 99 %.        Intake/Output Summary (Last 24 hours) at 02/16/2020 0803 Last data filed at 02/16/2020 0700 Gross per 24 hour  Intake 2906.93 ml  Output 1875 ml  Net 1031.93 ml   Filed Weights   02/12/20 2351 02/13/20 2106 02/15/20 0630  Weight: 126.6 kg 126.6 kg 132.3 kg   Examination: General: Morbidly obese male, resting comfortably in bed.  HENT: Mucus membranes moist and clear.  Lungs: Clear to  auscultation bilaterally. No respiratory distress.  Cardiovascular: Paced rhythm. Normal S1S2. Distal pulses 2+  Abdomen: Normoactive bowel sounds. Soft. Mildly tender to palpation throughout.  Extremities: No obvious joint deformity, no cyanosis or clubbing. Symmetrical bulk and tone  Neuro: AAOx4 following commands. 5/5 strength in upper and lower extremities bilaterally.  Skin: No rashes.    Resolved Hospital  Problem list     Assessment & Plan:   # Hemorrhagic Shock # ABLA 2/2 GIB - Multipe bloody stools late in shift 10/9 - Not requiring pressor support.  - s/p 6 PRBC -- hemoglobin is stable this morning - Trend hgb; transfuse as indicated - Protonix gtt - EGD/colonoscopy yesterday did not reveal source of bleeding although both upper and lower tracts with old blood present. Capsule endoscopy yesterday; results pending.    # Diarrhea -- likely UC flare - Symptoms began after pt ran out of UC med - GI panel, CDiff neg. - GI has started on home sulfasalazine, added budesonide - Supportive care  # Hx OSA - Non-complaint with CPAP at home  - inpt has desatted when asleep - may benefit from nocturnal BiPAP here - SpO2 goal > 92%  # Presyncope  - Due to hypotension vs possible vagal (occurred during BM) - Goal normotension  - Cardiology and EP on board  # AKI, improving  - Suspect pre-renal due to hypovolemia, hypotension - UOP has been adequate in the past 24 hours  - Continue trending renal indices, I/O   # Chronic systolic heart failure - LVEF 30-35% 9/21 - Euvolemic on examination today - Supportive care   # Polycythemia Vera, hemachromatosis, thrombocytosis, (JAK2 pos) - Followed by Dr. Marin Olp  - On Hydrea at home; discontinued in setting of bleed - Thrombocytosis improving today   # Coagulopathy - INR 1.6 > 1.3  - Improving, continue to monitor daily if patient continues to have bleeding     # Hypokalemia # Hypomagnesemia # Hypocalcemia  - Monitor daily - Replenish PRN  # DM  - SSI  Best practice:  Diet: NPO Pain/Anxiety/Delirium protocol (if indicated): na VAP protocol (if indicated): na DVT prophylaxis: SCD GI prophylaxis: protonix  Glucose control:  SSI Mobility: BR Code Status: Full Family Communication: Will updated today Disposition: ICU   Labs   CBC: Recent Labs  Lab 02/12/20 1841 02/12/20 2313 02/14/20 1500 02/14/20 2113  02/15/20 0419 02/15/20 1524 02/16/20 0332  WBC 11.0*   < > 11.5* 12.6* 13.9* 11.5* 9.1  NEUTROABS 8.9*  --   --   --   --   --   --   HGB 13.7   < > 7.0* 11.1* 10.3* 9.2* 9.8*  HCT 41.0   < > 21.6* 33.0* 31.0* 27.8* 29.2*  MCV 104.3*   < > 106.4* 94.3 93.7 94.6 93.6  PLT 913*   < > 874* 787* 817* 752* 583*   < > = values in this interval not displayed.    Basic Metabolic Panel: Recent Labs  Lab 02/12/20 2118 02/12/20 2313 02/13/20 0320 02/14/20 0813 02/14/20 1615 02/15/20 1524 02/16/20 0332  NA  --   --  133* 136 137 140 141  K  --   --  3.6 3.2* 4.3 3.7 3.5  CL  --   --  105 105 110 112* 112*  CO2  --   --  12* 21* 18* 19* 20*  GLUCOSE  --   --  97 184* 130* 103* 89  BUN  --   --  60* 46* 49* 46* 31*  CREATININE  --    < > 2.09* 2.01* 1.60* 1.58* 1.21  CALCIUM  --   --  8.6* 8.0* 7.1* 7.8* 7.7*  MG 1.6*  --   --  1.9  --   --   --   PHOS  --   --   --  2.4*  --   --   --    < > = values in this interval not displayed.   GFR: Estimated Creatinine Clearance: 79.9 mL/min (by C-G formula based on SCr of 1.21 mg/dL). Recent Labs  Lab 02/12/20 1922 02/12/20 2313 02/13/20 0320 02/14/20 0813 02/14/20 1158 02/14/20 1441 02/14/20 1459 02/14/20 1500 02/14/20 2113 02/15/20 0419 02/15/20 1524 02/16/20 0332  WBC  --    < > 10.8*   < >  --    < >  --    < > 12.6* 13.9* 11.5* 9.1  LATICACIDVEN 1.6  --  1.1  --  1.5  --  1.8  --   --   --   --   --    < > = values in this interval not displayed.    Liver Function Tests: Recent Labs  Lab 02/12/20 1841 02/13/20 0320  AST 18 21  ALT 7 11  ALKPHOS 64 61  BILITOT 0.3 0.7  PROT 6.3* 6.3*  ALBUMIN 3.4* 3.2*   Recent Labs  Lab 02/12/20 1841  LIPASE 63*  AMYLASE 116*   No results for input(s): AMMONIA in the last 168 hours.  ABG    Component Value Date/Time   PHART 7.368 01/15/2012 0820   PCO2ART 35.7 01/15/2012 0820   PO2ART 62.0 (L) 01/15/2012 0820   HCO3 23.5 01/15/2012 0828   TCO2 25 01/15/2012 0828    ACIDBASEDEF 2.0 01/15/2012 0828   O2SAT 65.4 04/29/2014 0343     Coagulation Profile: Recent Labs  Lab 02/14/20 1457  INR 1.6*    Cardiac Enzymes: No results for input(s): CKTOTAL, CKMB, CKMBINDEX, TROPONINI in the last 168 hours.  HbA1C: Hgb A1c MFr Bld  Date/Time Value Ref Range Status  04/28/2014 08:23 PM 6.3 (H) <5.7 % Final    Comment:    (NOTE)                                                                       According to the ADA Clinical Practice Recommendations for 2011, when HbA1c is used as a screening test:  >=6.5%   Diagnostic of Diabetes Mellitus           (if abnormal result is confirmed) 5.7-6.4%   Increased risk of developing Diabetes Mellitus References:Diagnosis and Classification of Diabetes Mellitus,Diabetes DJSH,7026,37(CHYIF 1):S62-S69 and Standards of Medical Care in         Diabetes - 2011,Diabetes OYDX,4128,78 (Suppl 1):S11-S61.     CBG: Recent Labs  Lab 02/15/20 1605 02/15/20 2003 02/15/20 2323 02/16/20 0348 02/16/20 0705  GLUCAP 98 117* 88 75 96   CRITICAL CARE Performed by: Jose Persia   Total critical care time: 32 minutes  Critical care time was exclusive of separately billable procedures and treating other patients.  Critical care was necessary to treat or prevent imminent or life-threatening deterioration.  Critical care was time  spent personally by me on the following activities: development of treatment plan with patient and/or surrogate as well as nursing, discussions with consultants, evaluation of patient's response to treatment, examination of patient, obtaining history from patient or surrogate, ordering and performing treatments and interventions, ordering and review of laboratory studies, ordering and review of radiographic studies, pulse oximetry and re-evaluation of patient's condition.   Eliseo Gum MSN, AGACNP-BC [place] [phone] If no answer, [phone] 02/16/2020, 8:03  AM

## 2020-02-16 NOTE — Anesthesia Postprocedure Evaluation (Signed)
Anesthesia Post Note  Patient: Mikhi Salberg  Procedure(s) Performed: ESOPHAGOGASTRODUODENOSCOPY (EGD) WITH PROPOFOL (N/A ) COLONOSCOPY (N/A )     Patient location during evaluation: Endoscopy Anesthesia Type: MAC Level of consciousness: awake and alert Pain management: pain level controlled Vital Signs Assessment: post-procedure vital signs reviewed and stable Respiratory status: spontaneous breathing, nonlabored ventilation, respiratory function stable and patient connected to nasal cannula oxygen Cardiovascular status: stable and blood pressure returned to baseline Postop Assessment: no apparent nausea or vomiting Anesthetic complications: no   No complications documented.  Last Vitals:  Vitals:   02/15/20 2255 02/15/20 2330  BP: (!) 126/52   Pulse:    Resp:    Temp: 36.7 C 36.4 C  SpO2:      Last Pain:  Vitals:   02/15/20 2330  TempSrc: Oral  PainSc:                  Andi Layfield COKER

## 2020-02-16 NOTE — Progress Notes (Signed)
PillCam study incomplete, only 26 minutes recorded, it appears the cable was defective.  I have discussed the same with the patient, his wife at bedside and his nurse is aware.  I will order an abdominal x-ray at bedside to evaluate for location of PillCam, and if it has exited into the colon, plan to keep patient n.p.o. post midnight.  Small bowel PillCam to be swallowed again tomorrow morning.  Patient noted to have liquid dark green stool when I was in the room to discuss the findings.  Blood pressure and heart rate are reassuring, bleeding seems to have resolved/slowed.  It is possible that he had a diverticular bleed.  Although he was noted to have superficial duodenal ulcers, there was no blood in the stomach or duodenum.  Blood was noted in the entire colon and terminal ileum.

## 2020-02-16 NOTE — Plan of Care (Signed)
  Problem: Health Behavior/Discharge Planning: Goal: Ability to manage health-related needs will improve Outcome: Progressing   Problem: Clinical Measurements: Goal: Ability to maintain clinical measurements within normal limits will improve Outcome: Progressing Goal: Will remain free from infection Outcome: Progressing Goal: Diagnostic test results will improve Outcome: Progressing Goal: Respiratory complications will improve Outcome: Progressing Goal: Cardiovascular complication will be avoided Outcome: Progressing   Problem: Activity: Goal: Risk for activity intolerance will decrease Outcome: Progressing   Problem: Nutrition: Goal: Adequate nutrition will be maintained Outcome: Progressing   Problem: Coping: Goal: Level of anxiety will decrease Outcome: Progressing   Problem: Elimination: Goal: Will not experience complications related to bowel motility Outcome: Progressing Goal: Will not experience complications related to urinary retention Outcome: Progressing   Problem: Pain Managment: Goal: General experience of comfort will improve Outcome: Progressing   Problem: Safety: Goal: Ability to remain free from injury will improve Outcome: Progressing   Problem: Skin Integrity: Goal: Risk for impaired skin integrity will decrease Outcome: Progressing   Problem: Education: Goal: Ability to identify signs and symptoms of gastrointestinal bleeding will improve Outcome: Progressing   Problem: Bowel/Gastric: Goal: Will show no signs and symptoms of gastrointestinal bleeding Outcome: Progressing   Problem: Fluid Volume: Goal: Will show no signs and symptoms of excessive bleeding Outcome: Progressing   Problem: Clinical Measurements: Goal: Complications related to the disease process, condition or treatment will be avoided or minimized Outcome: Progressing

## 2020-02-16 NOTE — Progress Notes (Signed)
I am very stunned about the turn of events for William Villanueva.  I saw him Saturday morning.  He looked pretty good.  He was not having any diarrhea.  I helped him to the bathroom that morning.  Apparently, things went "downhill" from there.  He passed out he says.  He has had a lot of hematochezia.  His hemoglobin is dropped tremendously.  He is at 6 units of blood so far.  His platelet counts come down quite nicely.  Today's platelet count is 583,000.  He is off the [facility] for right now.  Again, the source of GI bleeding is not clear.  He has had an upper endoscopy and colonoscopy.  I am incredibly impressed as how quickly he was evaluated over the weekend.  There was no obvious bleeding that was found on his endoscopy.  However, with colonoscopy, there was a lot of liquid blood in the colon.  I think he is going for a capsule endoscopy.  I remember that when I saw him Saturday morning, all of a sudden he said he had to go to the bathroom.  He is having a lot of crampy lower abdominal pain.  Maybe, this was indicative of something going on.  I we will put him on folic acid.  This may help a little bit.  We will just have to see how all of this plays out.  1 would have to think that there is some kind of process that is causing all this blood loss.  Certainly, having polycythemia would not be an issue.  There is no indication that his polycythemia is transforming.  I would think if this were the case that he was transforming to myelofibrosis or possibly an acute leukemia, his platelets certainly would not been that high when I saw him on Saturday.  We will follow along and try to help out with his blood counts.  Lattie Haw, MD  [facility] 6:10

## 2020-02-16 NOTE — Progress Notes (Signed)
[facility] Progress Note  William Villanueva 69 y.o. 1950/10/03  CC: GI bleed   Subjective: Patient seen and examined at bedside.  He is complaining of generalized abdominal discomfort.  Denies nausea or vomiting.  Discussed with RN.  Had bowel movement this morning which was mixed with old blood.  ROS : Complaining of fatigue and weakness.  Afebrile.   Objective: Vital signs in last 24 hours: Vitals:   02/16/20 0700 02/16/20 0832  BP: (!) 103/56   Pulse: 84   Resp: 15   Temp:  97.7 F (36.5 C)  SpO2: 99%     Physical Exam:  General:  Alert, cooperative, no distress, appears stated age  Head:  Normocephalic, without obvious abnormality, atraumatic  Eyes:  , EOM's intact,   Lungs:    No visible respiratory distress.  Heart:  Regular rate and rhythm, S1, S2 normal  Abdomen:    Abdomen is mildly distended, nontender, bowel sounds present.  No peritoneal signs  Extremities: Extremities normal, atraumatic, trace edema       Lab Results: Recent Labs    02/14/20 0813 02/14/20 1615 02/15/20 1524 02/16/20 0332  NA 136   < > 140 141  K 3.2*   < > 3.7 3.5  CL 105   < > 112* 112*  CO2 21*   < > 19* 20*  GLUCOSE 184*   < > 103* 89  BUN 46*   < > 46* 31*  CREATININE 2.01*   < > 1.58* 1.21  CALCIUM 8.0*   < > 7.8* 7.7*  MG 1.9  --   --   --   PHOS 2.4*  --   --   --    < > = values in this interval not displayed.   No results for input(s): AST, ALT, ALKPHOS, BILITOT, PROT, ALBUMIN in the last 72 hours. Recent Labs    02/15/20 1524 02/16/20 0332  WBC 11.5* 9.1  HGB 9.2* 9.8*  HCT 27.8* 29.2*  MCV 94.6 93.6  PLT 752* 583*   Recent Labs    02/14/20 1457 02/16/20 0848  LABPROT 18.1* 15.8*  INR 1.6* 1.3*      Assessment/Plan: -GI bleed with blood loss anemia.  EGD showed 2 nonbleeding duodenal ulcers.  Colonoscopy showed red blood throughout the colon.  Capsule endoscopy pending. -Acute blood loss anemia.  Hemoglobin stable.  Has received 6 units of  blood transfusion so far. -History of ulcerative colitis. -Chronic heart failure. -Polycythemia vera  Recommendations ------------------------ -Follow capsule endoscopy report -Advance diet to full liquid -Monitor H&H -Change Protonix drip to IV twice daily PPI -GI will follow  Otis Brace MD, [place] 02/16/2020, 10:16 AM  Contact #  [phone]

## 2020-02-17 ENCOUNTER — Encounter (HOSPITAL_COMMUNITY): Payer: Self-pay | Admitting: Gastroenterology

## 2020-02-17 ENCOUNTER — Encounter (HOSPITAL_COMMUNITY)
Admission: EM | Disposition: A | Discharge status: Home Health | Payer: Self-pay | Source: Home / Self Care | Attending: Critical Care Medicine

## 2020-02-17 ENCOUNTER — Inpatient Hospital Stay (HOSPITAL_COMMUNITY): Payer: Medicare Other

## 2020-02-17 DIAGNOSIS — K529 Noninfective gastroenteritis and colitis, unspecified: Secondary | ICD-10-CM | POA: Diagnosis not present

## 2020-02-17 DIAGNOSIS — M7989 Other specified soft tissue disorders: Secondary | ICD-10-CM | POA: Diagnosis not present

## 2020-02-17 DIAGNOSIS — M79609 Pain in unspecified limb: Secondary | ICD-10-CM

## 2020-02-17 DIAGNOSIS — E876 Hypokalemia: Secondary | ICD-10-CM

## 2020-02-17 LAB — CBC
HCT: 30.1 % — ABNORMAL LOW (ref 39.0–52.0)
Hemoglobin: 9.9 g/dL — ABNORMAL LOW (ref 13.0–17.0)
MCH: 31 pg (ref 26.0–34.0)
MCHC: 32.9 g/dL (ref 30.0–36.0)
MCV: 94.4 fL (ref 80.0–100.0)
Platelets: 537 10*3/uL — ABNORMAL HIGH (ref 150–400)
RBC: 3.19 MIL/uL — ABNORMAL LOW (ref 4.22–5.81)
RDW: 21.9 % — ABNORMAL HIGH (ref 11.5–15.5)
WBC: 9.7 10*3/uL (ref 4.0–10.5)
nRBC: 0.3 % — ABNORMAL HIGH (ref 0.0–0.2)

## 2020-02-17 LAB — GLUCOSE, CAPILLARY
Glucose-Capillary: 91 mg/dL (ref 70–99)
Glucose-Capillary: 153 mg/dL — ABNORMAL HIGH (ref 70–99)
Glucose-Capillary: 94 mg/dL (ref 70–99)
Glucose-Capillary: 96 mg/dL (ref 70–99)

## 2020-02-17 LAB — BASIC METABOLIC PANEL
Anion gap: 7 (ref 5–15)
BUN: 16 mg/dL (ref 8–23)
CO2: 23 mmol/L (ref 22–32)
Calcium: 8 mg/dL — ABNORMAL LOW (ref 8.9–10.3)
Chloride: 112 mmol/L — ABNORMAL HIGH (ref 98–111)
Creatinine, Ser: 1.11 mg/dL (ref 0.61–1.24)
GFR, Estimated: >60 mL/min (ref >60)
Glucose, Bld: 97 mg/dL (ref 70–99)
Potassium: 3.5 mmol/L (ref 3.5–5.1)
Sodium: 142 mmol/L (ref 135–145)

## 2020-02-17 LAB — APTT: aPTT: 38 seconds — ABNORMAL HIGH (ref 24–36)

## 2020-02-17 SURGERY — IMAGING PROCEDURE, GI TRACT, INTRALUMINAL, VIA CAPSULE

## 2020-02-17 NOTE — Progress Notes (Signed)
Left upper venous duplex  has been completed. Refer to [facility] under chart review to view preliminary results.   02/17/2020  11:49 AM Arminta Gamm, Bonnye Fava

## 2020-02-17 NOTE — Progress Notes (Signed)
Mr. Henard looks a lot better today.  He is sitting up in a chair.  He says he feels better.  He says that the bleeding has stopped.  Hopefully, he can now begin to have a little more of a p.o. intake.  He is still having the evaluation by GI for the GI blood loss.  I think he is going to have another capsule endoscopy today.  His labs show white cell count of 9.7.  Hemoglobin 9.9.  Platelet count 537,000.  His sodium is 142.  Potassium 3.5.  BUN 16 creatinine 1.11.  His left arm does look swollen.  I am sure he had an IV in the left arm.  You we will have to check him out for a thrombus.  If there is a thrombus in the left arm, I probably would consider IV heparin since this can be controlled most easily.  He has had no fever.  Thankfully, his pacemaker is working nicely.  His blood pressure is a lot better.  Oxygen saturation looks good.  His vital signs are temperature 97.7.  Pulse 80.  Blood pressure 117/58.  His lungs sound clear bilaterally.  Cardiac exam regular rate and rhythm.  He has no murmurs.  Abdomen is obese but soft.  Bowel sounds are decreased.  There is no guarding or rebound tenderness peer extremities shows a swelling in the left arm.  He has tenderness to palpation over the left forearm.  Lower extremities show some chronic mild edema.  Neurological exam is nonfocal.  Hopefully, this GI bleed is resolving.  Again, it is quite troublesome not knowing where this started.  I would have to think that this started in the small bowel.  Why he would have this I am not sure.  He is very thankful for the wonderful care that he is getting from all staff in the cardiac ICU.  I am not surprised by this.  The staff is so compassionate and caring.  Lattie Haw, MD  Lattie Corns

## 2020-02-17 NOTE — Evaluation (Signed)
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 Ongoing n/v/d x 3 weeks prior to ED presentation. In ED found to also have AKI, metabolic acidosis. 10/9 pt with near syncope when using restroom, followed by hypotension. Antihypertensive medication administration, bloody loose stool. Hgb noted to be 10.5 from 13.7 prior and then again to 7, CT a/p non con without evidence of RPB.    Clinical Impression   Pt independent PTA, although has appropriate DME from previous ailments. He enjoys spending time with his great grandsons. Today Pt is min guard assist for transfers with balance deficits and unsteadiness on feet. He is overall min guard for ADL - able to demonstrate LB dressing with figure 4 method, sink level grooming to wash hands after toilet transfer. Pt did reach out for external support from IV pole and grab bars during standing and transfers. Pt also with noted edema in LUE. Generalized weakness, ROM WFL, educated on edema management through elevation, and exercises. OT will follow acutely to maximize safety and independence in ADL and functional transfers. Next session to focus specifically on balance activities in standing.    Follow Up Recommendations  No OT follow up    Equipment Recommendations  None recommended by OT (Pt has appropriate DME)    Recommendations for Other Services       Precautions / Restrictions Precautions Precautions: Fall Restrictions Weight Bearing Restrictions: No      Mobility Bed Mobility               General bed mobility comments: in recliner at beginning and end of session  Transfers Overall transfer level: Needs assistance Equipment used: None (pushed  IV pole) Transfers: Sit to/from Stand Sit to Stand: Min guard;Min assist         General transfer comment: A for balance    Balance Overall balance assessment: Needs assistance Sitting-balance support: Feet supported Sitting balance-Leahy Scale: Good     Standing balance support: Single extremity supported Standing balance-Leahy Scale: Poor Standing balance comment: reaching out and pushing IV pole during dynamic balance activities. leans against sink for static                           ADL either performed or assessed with clinical judgement   ADL Overall ADL's : Needs assistance/impaired Eating/Feeding: Set up;Sitting Eating/Feeding Details (indicate cue type and reason): finishing up lunch seated in recliner upon entry, can open all containers Grooming: Wash/dry hands;Min guard;Standing Grooming Details (indicate cue type and reason): sink level Upper Body Bathing: Min guard;Sitting   Lower Body Bathing: Min guard;Sitting/lateral leans   Upper Body Dressing : Set up;Sitting   Lower Body Dressing: Set up;Sit to/from stand Lower Body Dressing Details (indicate cue type and reason): able to perform figure 4 and doff/don socks Toilet Transfer: Min guard;Minimal assistance;Ambulation Toilet Transfer Details (indicate cue type and reason): pushing IV pole for balance Toileting- Clothing Manipulation and Hygiene: Min guard;Sit to/from stand Toileting - Clothing Manipulation Details (indicate cue type and reason): use of grab bars to assist     Functional mobility during ADLs: Min guard;Minimal assistance;Cueing for safety (pushing IV pole)       Vision  Perception     Praxis      Pertinent Vitals/Pain Pain Assessment: Faces Faces Pain Scale: Hurts a little bit Pain Location: L hand and arm Pain Descriptors / Indicators: Discomfort;Tightness;Sore Pain Intervention(s): Monitored during session;Repositioned;Other (comment) (educated on elevation  and exercises for edema mgmt)     Hand Dominance Right   Extremity/Trunk Assessment Upper Extremity Assessment Upper Extremity Assessment: LUE deficits/detail LUE Deficits / Details: generalized edema throughout, generalized weakness (suspect edema) 4/5, and unable to fully extend elbow,  LUE Sensation: WNL LUE Coordination: WNL (slightly impaired at elbow (cannot get full extension))   Lower Extremity Assessment Lower Extremity Assessment: Defer to PT evaluation       Communication Communication Communication: No difficulties   Cognition Arousal/Alertness: Awake/alert Behavior During Therapy: WFL for tasks assessed/performed Overall Cognitive Status: Within Functional Limits for tasks assessed                                     General Comments  SpO2>90 throughout session, HR with unsustained Vtach, HR 98-124 with activity, BP WFL upon sitting in recliner    Exercises     Shoulder Instructions      Home Living Family/patient expects to be discharged to:: Private residence Living Arrangements: Spouse/significant other Available Help at Discharge: Family;Available 24 hours/day Type of Home: House Home Access: Stairs to enter [employer] of Steps: 6-7 Entrance Stairs-Rails: Right;Left;Can reach both Home Layout: One level     Bathroom Shower/Tub: Teacher, early years/pre: Standard Bathroom Accessibility: Yes How Accessible: Accessible via walker Home Equipment: Cane - single point;Walker - 2 wheels;Shower seat;Electric scooter;Hand held shower head          Prior Functioning/Environment Level of Independence: Independent                 OT Problem List: Decreased range of motion;Impaired balance (sitting and/or standing);Decreased knowledge of use of DME or AE;Cardiopulmonary status limiting activity;Obesity;Increased edema      OT Treatment/Interventions: Self-care/ADL training;Therapeutic exercise;DME and/or AE  instruction;Therapeutic activities;Patient/family education;Balance training    OT Goals(Current goals can be found in the care plan section) Acute Rehab OT Goals Patient Stated Goal: Get back home and be able to play with grandson "Ryker" OT Goal Formulation: With patient Time For Goal Achievement: 03/02/20 Potential to Achieve Goals: Good ADL Goals Pt Will Perform Grooming: with supervision;standing Pt Will Transfer to Toilet: with supervision;ambulating Pt Will Perform Toileting - Clothing Manipulation and hygiene: with modified independence;sit to/from stand Additional ADL Goal #1: Pt will demonstrate edema management through exercises and elevation at independent level  OT Frequency: Min 3X/week   Barriers to D/C:            Co-evaluation              AM-PAC OT "6 Clicks" Daily Activity     Outcome Measure Help from another person eating meals?: None Help from another person taking care of personal grooming?: A Little Help from another person toileting, which includes using toliet, bedpan, or urinal?: A Little Help from another person bathing (including washing, rinsing, drying)?: A Little Help from another person to put on and taking off regular upper body clothing?: A Little Help from another person to put on and taking off regular lower body clothing?: A Little 6 Click Score: 19   End of Session Equipment Utilized During Treatment: Gait belt Nurse Communication: Mobility status;Other (comment)  Activity Tolerance: Patient tolerated treatment well Patient left: in chair;with call bell/phone within reach  OT Visit Diagnosis: Unsteadiness on feet (R26.81);History of falling (Z91.81);Muscle weakness (generalized) (M62.81)                Time: 3926-5997 OT Time Calculation (min): 17 min Charges:  OT General Charges $OT Visit: 1 Visit OT Evaluation $OT Eval Moderate Complexity: [place] OTR/L Acute Rehabilitation Services Pager: [phone] Office:  [place] 02/17/2020, 1:24 PM

## 2020-02-17 NOTE — Progress Notes (Addendum)
[facility] Progress Note  William Villanueva 69 y.o. May 02, 1951  CC: GI bleed   Subjective: Patient seen and examined at bedside.  Sitting comfortably in the chair.  Denies any further bleeding episodes.  Mild generalized discomfort.  Denies nausea and vomiting.  ROS : Complaining of fatigue and weakness.  Afebrile.   Objective: Vital signs in last 24 hours: Vitals:   02/17/20 0700 02/17/20 0800  BP: (!) 117/58 (!) 106/59  Pulse: 80 76  Resp: 19 20  Temp:    SpO2: 94% 95%    Physical Exam:  General:  Alert, cooperative, no distress, appears stated age  Head:  Normocephalic, without obvious abnormality, atraumatic  Eyes:  , EOM's intact,   Lungs:    No visible respiratory distress.  Heart:  Regular rate and rhythm, S1, S2 normal  Abdomen:    Abdomen is mildly distended, nontender, bowel sounds present.  No peritoneal signs  Extremities: Extremities normal, atraumatic, trace edema       Lab Results: Recent Labs    02/16/20 0332 02/17/20 0142  NA 141 142  K 3.5 3.5  CL 112* 112*  CO2 20* 23  GLUCOSE 89 97  BUN 31* 16  CREATININE 1.21 1.11  CALCIUM 7.7* 8.0*   No results for input(s): AST, ALT, ALKPHOS, BILITOT, PROT, ALBUMIN in the last 72 hours. Recent Labs    02/16/20 1507 02/17/20 0142  WBC 9.1 9.7  HGB 9.8* 9.9*  HCT 29.1* 30.1*  MCV 92.4 94.4  PLT 581* 537*   Recent Labs    02/14/20 1457 02/16/20 0848  LABPROT 18.1* 15.8*  INR 1.6* 1.3*      Assessment/Plan: -GI bleed with blood loss anemia.  EGD showed 2 nonbleeding duodenal ulcers.  Colonoscopy showed red blood throughout the colon.  Capsule endoscopy was performed but not completed because of technical issues. -Acute blood loss anemia.  Hemoglobin stable.  Has received 6 units of blood transfusion so far. -History of ulcerative colitis. -Chronic heart failure. -Polycythemia vera  Recommendations ------------------------ -There was technical issues with capsule endoscopy.   Capsule remained in the stomach for several minutes but small bowel was not visualized.  X-ray abdomen yesterday and today showed capsule in the lower pelvis, most likely in the rectum.  -Recent bleeding scan was negative.  No further bleeding episodes.  Hemoglobin stable.  Do not think there is a need for repeat capsule endoscopy at this point.  -Advance diet to soft.  Monitor H&H.  Continue IV twice daily PPI while in the hospital, switch to oral pantoprazole 40 mg twice a day for 4 weeks on discharge followed by Protonix 40 mg once a day for another 4 weeks.  -Repeat abdominal x-ray in the morning for follow-up on retained capsule  -Follow-up with [facility]/ Dr. Watt Climes in 2 months after discharge.  Otis Brace MD, FACP 02/17/2020, 9:22 AM  Contact #  [phone]

## 2020-02-17 NOTE — Progress Notes (Signed)
[facility] PROGRESS NOTE    Progress Note  William Villanueva  ZOX:[id number] DOB: Jan 12, 1951 DOA: 02/12/2020 PCP: Antony Contras, MD     Brief Narrative:   William Villanueva is an 69 y.o. 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 Started on Protonix drip now transition to twice a day IV. GI was consulted to perform an EGD and colonoscopy that did not reveal source of bleeding although he had blood present in both the upper and lower tract capsule endoscopy started yesterday. hemoGlobin this morning is 9.9. PillCam study was incomplete only 26 minutes recorded appears the cable was defective.  GI recommended an abdominal x-ray and to keep n.p.o. starting midnight.  This morning have personally reviewed showed The Rectum. He has had no further bloody bowel movement I think bleed has subsided.  Diarrhea likely due to UC flare: Patient ran out of meds at home. Currently on budesonide and sulfadiazine. Continue supportive care.  History of obstructive sleep apnea: Noncompliant with his CPAP at home may benefit from nocturnal CPAP.  Presyncope: Possibly due to hemorrhagic shock now resolved.  Acute kidney injury: Likely prerenal azotemia in the setting of hemorrhagic shock resolved with IV fluid  hydration.  Chronic Systolic heart failure: With an EF of 30% appears euvolemic on physical exam.  Polycythemia/hemochromatosis/thrombocytosis (JAK2 positive): Follow-up with Dr. Nicole Kindred was discontinued in the setting of GI bleed. GI to recommend when to start.  Coagulopathy  INR 1.3 continue to follow closely:  Electrolyte imbalance: Replete electrolyte as needed try to keep potassium greater than 4 magnesium greater than 2 calcium greater than 8.5  DVT prophylaxis: SCD Family Communication:none Status is: Inpatient  Remains inpatient appropriate because:Hemodynamically unstable   Dispo: The patient is from: Home              Anticipated d/c is to: Home              Anticipated d/c date is: 2 days              Patient currently is not medically stable to d/c.        Code Status:     Code Status Orders  (From admission, onward)         Start     Ordered   02/12/20 2225  Full code  Continuous        02/12/20 2227        Code Status History    Date Active Date Inactive Code Status Order ID Comments User Context   05/27/2016 0125 05/29/2016 1703 Full Code [id number]  Edwin Dada, MD Inpatient   01/19/2015 1513 01/21/2015 1503 Full Code [id number]  Consuelo Pandy, PA-C Inpatient   06/21/2014 1633 06/23/2014 1833 Full Code [id number]  Jacolyn Reedy, MD ED   04/29/2014 1605 04/30/2014 1635 Full Code [id number]  [name]  Loletha Grayer, MD Inpatient   04/28/2014 1951 04/29/2014 1605 Full Code [id number]  Crista Luria Inpatient   08/27/2013 1321 08/28/2013 0338 Full Code [id number]  Jules Schick, MD [facility]   Advance Care Planning Activity        IV Access:    Peripheral IV   Procedures and diagnostic studies:   DG Abd Portable 1V  Result Date: 02/16/2020 CLINICAL DATA:  PillCam. EXAM: PORTABLE ABDOMEN - 1 VIEW COMPARISON:  None. FINDINGS: The bowel gas pattern is normal. Metallic density is seen in the pelvis consistent with history  of ingested endoscopy capsule. No radio-opaque calculi or other significant radiographic abnormality are seen. IMPRESSION: Metallic density seen in pelvis consistent with history of ingested endoscopy capsule. Electronically Signed   By: Marijo Conception M.D.   On: 02/16/2020 13:50     Medical Consultants:    None.  Anti-Infectives:   none  Subjective:    William Villanueva he relates he is hungry this morning no other complaints.  Objective:    Vitals:   02/17/20 0340 02/17/20 0400 02/17/20 0500 02/17/20 0600  BP:   122/64   Pulse:  88 86 83  Resp:  18 20 20   Temp: 97.7 F (36.5 C)     TempSrc: Oral     SpO2:  98% 96% 99%  Weight:      Height:       SpO2: 99 % O2 Flow Rate (L/min): 2 L/min   Intake/Output Summary (Last 24 hours) at 02/17/2020 0659 Last data filed at 02/17/2020 0600 Gross per 24 hour  Intake 1360.23 ml  Output 1150 ml  Net 210.23 ml   Filed Weights   02/12/20 2351 02/13/20 2106 02/15/20 0630  Weight: 126.6 kg 126.6 kg 132.3 kg    Exam: General exam: In no acute distress. Respiratory system: Good air movement and clear to auscultation.  Cardiovascular system: S1 & S2 heard, RRR. No  Gastrointestinal system: Positive bowel sounds soft right lower quadrant tenderness no rebound or guarding Extremities: No pedal edema. Skin: No rashes, lesions or ulcers Psychiatry: Judgement and insight appear normal. Mood & affect appropriate.    Data Reviewed:    Labs: Basic Metabolic Panel: Recent Labs  Lab 02/12/20 2118 02/12/20 2313 02/14/20 0813 02/14/20 0813 02/14/20 1615 02/14/20 1615 02/15/20 1524 02/15/20 1524 02/16/20 0332 02/17/20 0142  NA  --    < > 136  --  137  --  140  --  141 142  K  --    < > 3.2*   < > 4.3   < > 3.7   < > 3.5 3.5  CL  --    < > 105  --  110  --  112*  --  112* 112*  CO2  --    < > 21*  --  18*  --  19*  --  20* 23  GLUCOSE  --    < > 184*  --  130*  --  103*  --  89 97  BUN  --    < > 46*  --  49*  --  46*   --  31* 16  CREATININE  --    < > 2.01*  --  1.60*  --  1.58*  --  1.21 1.11  CALCIUM  --    < > 8.0*  --  7.1*  --  7.8*  --  7.7* 8.0*  MG 1.6*  --  1.9  --   --   --   --   --   --   --  PHOS  --   --  2.4*  --   --   --   --   --   --   --    < > = values in this interval not displayed.   GFR Estimated Creatinine Clearance: 87.1 mL/min (by C-G formula based on SCr of 1.11 mg/dL). Liver Function Tests: Recent Labs  Lab 02/12/20 1841 02/13/20 0320  AST 18 21  ALT 7 11  ALKPHOS 64 61  BILITOT 0.3 0.7  PROT 6.3* 6.3*  ALBUMIN 3.4* 3.2*   Recent Labs  Lab 02/12/20 1841  LIPASE 63*  AMYLASE 116*   No results for input(s): AMMONIA in the last 168 hours. Coagulation profile Recent Labs  Lab 02/14/20 1457 02/16/20 0848  INR 1.6* 1.3*   COVID-19 Labs  No results for input(s): DDIMER, FERRITIN, LDH, CRP in the last 72 hours.  Lab Results  Component Value Date   [place] NEGATIVE 02/12/2020   [place] NEGATIVE 02/04/2020    CBC: Recent Labs  Lab 02/12/20 1841 02/12/20 2313 02/15/20 0419 02/15/20 1524 02/16/20 0332 02/16/20 1507 02/17/20 0142  WBC 11.0*   < > 13.9* 11.5* 9.1 9.1 9.7  NEUTROABS 8.9*  --   --   --   --   --   --   HGB 13.7   < > 10.3* 9.2* 9.8* 9.8* 9.9*  HCT 41.0   < > 31.0* 27.8* 29.2* 29.1* 30.1*  MCV 104.3*   < > 93.7 94.6 93.6 92.4 94.4  PLT 913*   < > 817* 752* 583* 581* 537*   < > = values in this interval not displayed.   Cardiac Enzymes: No results for input(s): CKTOTAL, CKMB, CKMBINDEX, TROPONINI in the last 168 hours. BNP (last 3 results) Recent Labs    01/02/20 0859  PROBNP 2,894*   CBG: Recent Labs  Lab 02/16/20 0828 02/16/20 1220 02/16/20 1550 02/16/20 2008 02/17/20 0620  GLUCAP 114* 93 106* 103* 91   D-Dimer: No results for input(s): DDIMER in the last 72 hours. Hgb A1c: No results for input(s): HGBA1C in the last 72 hours. Lipid Profile: No results for input(s): CHOL, HDL, LDLCALC, TRIG, CHOLHDL,  LDLDIRECT in the last 72 hours. Thyroid function studies: No results for input(s): TSH, T4TOTAL, T3FREE, THYROIDAB in the last 72 hours.  Invalid input(s): FREET3 Anemia work up: Recent Labs    02/14/20 2113  RETICCTPCT 2.1   Sepsis Labs: Recent Labs  Lab 02/12/20 1922 02/12/20 2313 02/13/20 0320 02/14/20 0813 02/14/20 1158 02/14/20 1441 02/14/20 1459 02/14/20 1500 02/15/20 1524 02/16/20 0332 02/16/20 1507 02/17/20 0142  WBC  --    < > 10.8*   < >  --    < >  --    < > 11.5* 9.1 9.1 9.7  LATICACIDVEN 1.6  --  1.1  --  1.5  --  1.8  --   --   --   --   --    < > = values in this interval not displayed.   Microbiology Recent Results (from the past 240 hour(s))  Gastrointestinal Panel by PCR , Stool     Status: None   Collection Time: 02/12/20  7:23 PM   Specimen: Stool  Result Value Ref Range Status   Campylobacter species NOT DETECTED NOT DETECTED Final   Plesimonas shigelloides NOT DETECTED NOT DETECTED Final   Salmonella species NOT DETECTED NOT DETECTED Final   Yersinia enterocolitica NOT DETECTED NOT DETECTED Final   Vibrio species NOT DETECTED  NOT DETECTED Final   Vibrio cholerae NOT DETECTED NOT DETECTED Final   Enteroaggregative E coli (EAEC) NOT DETECTED NOT DETECTED Final   Enteropathogenic E coli (EPEC) NOT DETECTED NOT DETECTED Final   Enterotoxigenic E coli (ETEC) NOT DETECTED NOT DETECTED Final   Shiga like toxin producing E coli (STEC) NOT DETECTED NOT DETECTED Final   Shigella/Enteroinvasive E coli (EIEC) NOT DETECTED NOT DETECTED Final   Cryptosporidium NOT DETECTED NOT DETECTED Final   Cyclospora cayetanensis NOT DETECTED NOT DETECTED Final   Entamoeba histolytica NOT DETECTED NOT DETECTED Final   Giardia lamblia NOT DETECTED NOT DETECTED Final   Adenovirus F40/41 NOT DETECTED NOT DETECTED Final   Astrovirus NOT DETECTED NOT DETECTED Final   Norovirus GI/GII NOT DETECTED NOT DETECTED Final   Rotavirus A NOT DETECTED NOT DETECTED Final   Sapovirus  (I, II, IV, and V) NOT DETECTED NOT DETECTED Final    Comment: Performed at [facility], [address] [place], [place] [address]  C Difficile Quick Screen w PCR reflex     Status: None   Collection Time: 02/12/20  7:23 PM   Specimen: Stool  Result Value Ref Range Status   C Diff antigen NEGATIVE NEGATIVE Final   C Diff toxin NEGATIVE NEGATIVE Final   C Diff interpretation No C. difficile detected.  Final    Comment: Performed at [facility], [place] [address] [place], [place] [address]  Respiratory Panel by RT PCR (Flu A&B, Covid) - Nasopharyngeal Swab     Status: None   Collection Time: 02/12/20 10:24 PM   Specimen: Nasopharyngeal Swab  Result Value Ref Range Status   SARS Coronavirus 2 by RT PCR NEGATIVE NEGATIVE Final    Comment: (NOTE) SARS-CoV-2 target nucleic acids are NOT DETECTED.  The SARS-CoV-2 RNA is generally detectable in upper respiratoy specimens during the acute phase of infection. The lowest concentration of SARS-CoV-2 viral copies this assay can detect is 131 copies/mL. A negative result does not preclude SARS-Cov-2 infection and should not be used as the sole basis for treatment or other patient management decisions. A negative result may occur with  improper specimen collection/handling, submission of specimen other than nasopharyngeal swab, presence of viral mutation(s) within the areas targeted by this assay, and inadequate number of viral copies (<131 copies/mL). A negative result must be combined with clinical observations, patient history, and epidemiological information. The expected result is Negative.  Fact Sheet for Patients:  PinkCheek.be  Fact Sheet for Healthcare Providers:  GravelBags.it  This test is no t yet approved or cleared by the [place] FDA and  has been authorized for detection and/or diagnosis of SARS-CoV-2 by FDA under an Emergency Use Authorization  (EUA). This EUA will remain  in effect (meaning this test can be used) for the duration of the COVID-19 declaration under Section 564(b)(1) of the Act, 21 U.S.C. section 360bbb-3(b)(1), unless the authorization is terminated or revoked sooner.     Influenza A by PCR NEGATIVE NEGATIVE Final   Influenza B by PCR NEGATIVE NEGATIVE Final    Comment: (NOTE) The Xpert Xpress SARS-CoV-2/FLU/RSV assay is intended as an aid in  the diagnosis of influenza from Nasopharyngeal swab specimens and  should not be used as a sole basis for treatment. Nasal washings and  aspirates are unacceptable for Xpert Xpress SARS-CoV-2/FLU/RSV  testing.  Fact Sheet for Patients: PinkCheek.be  Fact Sheet for Healthcare Providers: GravelBags.it  This test is not yet approved or cleared by the [place] FDA  and  has been authorized for detection and/or diagnosis of SARS-CoV-2 by  FDA under an Emergency Use Authorization (EUA). This EUA will remain  in effect (meaning this test can be used) for the duration of the  Covid-19 declaration under Section 564(b)(1) of the Act, 21  U.S.C. section 360bbb-3(b)(1), unless the authorization is  terminated or revoked. Performed at [facility], [place] [address] [place], [place] [address]   MRSA PCR Screening     Status: None   Collection Time: 02/16/20  7:46 AM   Specimen: Nasopharyngeal  Result Value Ref Range Status   MRSA by PCR NEGATIVE NEGATIVE Final    Comment:        The GeneXpert MRSA Assay (FDA approved for NASAL specimens only), is one component of a comprehensive MRSA colonization surveillance program. It is not intended to diagnose MRSA infection nor to guide or monitor treatment for MRSA infections. Performed at [facility], [place] [address], [place], [place] [address]      Medications:   . allopurinol  300 mg Oral Daily  . budesonide  9 mg Oral Daily  . Chlorhexidine  Gluconate Cloth  6 each Topical Daily  . citalopram  5 mg Oral Daily  . feeding supplement (ENSURE ENLIVE)  237 mL Oral TID BM  . folic acid  2 mg Oral Daily  . Gerhardt's butt cream   Topical Daily  . insulin aspart  0-15 Units Subcutaneous TID AC & HS  . multivitamin with minerals  1 tablet Oral Daily  . oxybutynin  10 mg Oral Daily  . pantoprazole (PROTONIX) IV  40 mg Intravenous Q12H  . rosuvastatin  5 mg Oral Daily  . sulfaSALAzine  2,000 mg Oral BID  . tamsulosin  0.4 mg Oral QPC supper   Continuous Infusions: . sodium chloride 20 mL/hr at 02/17/20 0500      LOS: 5 days   Charlynne Cousins  [facility]  02/17/2020, 6:59 AM

## 2020-02-17 NOTE — Progress Notes (Signed)
Patient arrived to the floor, via wheelchair, from [facility] to [phone]. No c/o pain or discomfort at this time.

## 2020-02-18 ENCOUNTER — Inpatient Hospital Stay (HOSPITAL_COMMUNITY): Payer: Medicare Other

## 2020-02-18 DIAGNOSIS — K51919 Ulcerative colitis, unspecified with unspecified complications: Secondary | ICD-10-CM

## 2020-02-18 DIAGNOSIS — D62 Acute posthemorrhagic anemia: Secondary | ICD-10-CM

## 2020-02-18 DIAGNOSIS — R578 Other shock: Secondary | ICD-10-CM

## 2020-02-18 DIAGNOSIS — R55 Syncope and collapse: Secondary | ICD-10-CM

## 2020-02-18 DIAGNOSIS — R42 Dizziness and giddiness: Secondary | ICD-10-CM

## 2020-02-18 LAB — BASIC METABOLIC PANEL
Anion gap: 7 (ref 5–15)
BUN: 11 mg/dL (ref 8–23)
CO2: 22 mmol/L (ref 22–32)
Calcium: 7.9 mg/dL — ABNORMAL LOW (ref 8.9–10.3)
Chloride: 111 mmol/L (ref 98–111)
Creatinine, Ser: 1.1 mg/dL (ref 0.61–1.24)
GFR, Estimated: >60 mL/min (ref >60)
Glucose, Bld: 98 mg/dL (ref 70–99)
Potassium: 3.3 mmol/L — ABNORMAL LOW (ref 3.5–5.1)
Sodium: 140 mmol/L (ref 135–145)

## 2020-02-18 LAB — CBC
HCT: 29.6 % — ABNORMAL LOW (ref 39.0–52.0)
Hemoglobin: 9.8 g/dL — ABNORMAL LOW (ref 13.0–17.0)
MCH: 31.7 pg (ref 26.0–34.0)
MCHC: 33.1 g/dL (ref 30.0–36.0)
MCV: 95.8 fL (ref 80.0–100.0)
Platelets: 424 10*3/uL — ABNORMAL HIGH (ref 150–400)
RBC: 3.09 MIL/uL — ABNORMAL LOW (ref 4.22–5.81)
RDW: 22.1 % — ABNORMAL HIGH (ref 11.5–15.5)
WBC: 10 10*3/uL (ref 4.0–10.5)
nRBC: 0.6 % — ABNORMAL HIGH (ref 0.0–0.2)

## 2020-02-18 LAB — GLUCOSE, CAPILLARY
Glucose-Capillary: 107 mg/dL — ABNORMAL HIGH (ref 70–99)
Glucose-Capillary: 107 mg/dL — ABNORMAL HIGH (ref 70–99)

## 2020-02-18 MED ORDER — BUDESONIDE 3 MG PO CPEP
9.0000 mg | ORAL_CAPSULE | Freq: Every day | ORAL | 0 refills | Status: DC
Start: 2020-02-18 — End: 2020-03-10

## 2020-02-18 MED ORDER — SULFASALAZINE 500 MG PO TBEC
2000.0000 mg | DELAYED_RELEASE_TABLET | Freq: Two times a day (BID) | ORAL | 0 refills | Status: DC
Start: 2020-02-18 — End: 2020-03-25

## 2020-02-18 NOTE — TOC Transition Note (Signed)
Transition of Care [facility]) - CM/SW Discharge Note   Patient Details  Name: William Villanueva MRN: [id number] Date of Birth: [birth year]  Transition of Care [facility]) CM/SW Contact:  Ninfa Meeker, RN Phone Number: 02/18/2020, 1:20 PM   Clinical Narrative:   Patient is a 69 yr old male admitted for treatment of acute gastroenteritis. Patient will dc home with [facility] and DME. CM spoke with patient's wife to discuss home health agencies. Referral called to [facility]. Representative from [employer] will deliver 3in1 to patient's room. No further TOC needs identified.     Final next level of care: Home w Home Health Services Barriers to Discharge: No Barriers Identified   Patient Goals and CMS Choice   CMS Medicare.gov Compare Post Acute Care list provided to:: Patient Represenative (must comment) (wife via phone) Choice offered to / list presented to : Spouse  Discharge Placement                       Discharge Plan and Services   Discharge Planning Services: CM Consult Post Acute Care Choice: Durable Medical Equipment, Home Health          DME Arranged: 3-N-1 DME Agency: (P) [employer] Date DME Agency Contacted: 02/18/20 Time DME Agency Contacted: 1200 Representative spoke with at DME Agency: Freda Munro [place]: PT [place]: [place] Date [place]: 02/18/20 Time [place]: 1210 Representative spoke with at [place]: [place] ([place]) Interventions     Readmission Risk Interventions No flowsheet data found.

## 2020-02-18 NOTE — Evaluation (Addendum)
Physical Therapy Evaluation Patient Details Name: William Villanueva MRN: [id number] DOB: 01/01/1951 Today's Date: 02/18/2020   History of Present Illness  69 yo M PMH includes UC, Polycythemia vera, hemochromatosis, non-ischemic cardiomyopathy, 2nd degree AV block now w/p St. Jude, who was admitted to [facility] service 10/7 with CC n/v/d. Ongoing n/v/d x 3 weeks prior to ED presentation. In ED found to also have AKI, metabolic acidosis. 10/9 pt with near syncope when using restroom, followed by hypotension. Antihypertensive medication administration, bloody loose stool. Hgb noted to be 10.5 from 13.7 prior and then again to 7, CT a/p non con without evidence of RPB.   Clinical Impression  PTA pt states he was I but required rest or use of scooter for increased distances; pt reaching out for walls and bed when attempting to ambulate in room, Therapist transitioned pt to RW with pt mod I during transfers and gait; pt educated on safety with use of RW as well as need for RW to decrease fall risk at this time, pt and wife verbalized understanding; pt demonstrating poor balance with static and dynamic standing; pt will benefit from HHPT to address deficits in gait, balance, strength and endurance to maximize independence with functional mobility; pt is safe to d/c home with use of RW at home and S from wife, if pt remains in hospital will benefit from acute PT to improve balance and progress pt to PLOF     Follow Up Recommendations Home health PT;Supervision/Assistance - 24 hour    Equipment Recommendations  3in1 (PT)    Recommendations for Other Services       Precautions / Restrictions Precautions Precautions: Fall Restrictions Weight Bearing Restrictions: No      Mobility  Bed Mobility Overal bed mobility: Modified Independent                Transfers Overall transfer level: Modified independent Equipment used: Rolling walker (2 wheeled)             General transfer comment:  performed ambulation and transfer without RW with SBA needed, pt reaching out for walls during ambulation due to balance deficits; performed again with RW mod I, 1 verbal cue needed to improve safety with turns  Ambulation/Gait Ambulation/Gait assistance: Modified independent (Device/Increase time) Gait Distance (Feet): 100 Feet Assistive device: Rolling walker (2 wheeled)   Gait velocity: decreased   General Gait Details: performed ambulation wtihout AD with increased lateral weight shift, wide BOS, decreased stride length and height B, reaching for walls; ambulation wtih RW improved gait pattern noted; education to not carry objects while ambulating and to use RW at this time  Stairs Stairs:  (pt refused) pt educated on LE sequencing up with the good and down with the bad to improve safety and control on stairs, education to wife to provide S on the stairs          Wheelchair Mobility    Modified Rankin (Stroke Patients Only)       Balance Overall balance assessment: Needs assistance Sitting-balance support: Feet supported Sitting balance-Leahy Scale: Good     Standing balance support: Bilateral upper extremity supported Standing balance-Leahy Scale: Poor Standing balance comment: pt reaching out for objects or requiring use of RW during standing                             Pertinent Vitals/Pain Pain Assessment: Faces Faces Pain Scale: Hurts a little bit Pain Location: L hand and  arm Pain Descriptors / Indicators: Discomfort;Tightness;Sore    Home Living Family/patient expects to be discharged to:: Private residence Living Arrangements: Spouse/significant other Available Help at Discharge: Family;Available 24 hours/day Type of Home: House Home Access: Stairs to enter Entrance Stairs-Rails: Right;Left;Can reach both Entrance Stairs-Number of Steps: 6-7 Home Layout: One level Home Equipment: Cane - single point;Walker - 2 wheels;Electric scooter;Hand  held shower head      Prior Function Level of Independence: Independent         Comments: pt states he has been limited on ambulation distance due to SOB     Hand Dominance   Dominant Hand: Right    Extremity/Trunk Assessment   Upper Extremity Assessment Upper Extremity Assessment: Defer to OT evaluation    Lower Extremity Assessment Lower Extremity Assessment: Generalized weakness    Cervical / Trunk Assessment Cervical / Trunk Assessment: Kyphotic  Communication   Communication: No difficulties  Cognition Arousal/Alertness: Awake/alert Behavior During Therapy: WFL for tasks assessed/performed Overall Cognitive Status: Within Functional Limits for tasks assessed                                 General Comments: VSS      General Comments      Exercises     Assessment/Plan                                                PT Assessment All further PT needs can be met in the next venue of care, homehealth  PT Problem List Decreased strength;Decreased mobility;Decreased safety awareness;Decreased coordination;Decreased activity tolerance;Decreased balance       PT Treatment Interventions  gait, therapeutic exercise, therapeutic activity, balance, DME education    PT Goals (Current goals can be found in the Care Plan section)  Acute Rehab PT Goals Patient Stated Goal: "to go home as soon as possible PT Goal Formulation: With patient/family Time For Goal Achievement: 02/18/20 Potential to Achieve Goals: Good    Frequency  3x per week if pt remains hospitalized    Barriers to discharge        Co-evaluation               AM-PAC PT "6 Clicks" Mobility  Outcome Measure Help needed turning from your back to your side while in a flat bed without using bedrails?: None Help needed moving from lying on your back to sitting on the side of a flat bed without using bedrails?: None Help needed moving to and from a bed to a chair (including a  wheelchair)?: A Little Help needed standing up from a chair using your arms (e.g., wheelchair or bedside chair)?: A Little Help needed to walk in hospital room?: A Little Help needed climbing 3-5 steps with a railing? : A Little 6 Click Score: 20    End of Session Equipment Utilized During Treatment: Gait belt Activity Tolerance: Patient tolerated treatment well Patient left: in bed;with family/visitor present Nurse Communication: Mobility status PT Visit Diagnosis: Unsteadiness on feet (R26.81);Other abnormalities of gait and mobility (R26.89);Muscle weakness (generalized) (M62.81)    Time: 9163-8466 PT Time Calculation (min) (ACUTE ONLY): 18 min   Charges:   PT Evaluation $PT Eval Moderate Complexity: [place], DPT Acute Rehabilitation Services [phone]  Maricela Bo Okema [place] 02/18/2020, 10:32 AM

## 2020-02-18 NOTE — Progress Notes (Signed)
[facility] Progress Note  William Villanueva 69 y.o. [birth year]  CC: GI bleed   Subjective: Patient seen and examined at bedside.  Doing better.  Denies any further bleeding episodes.  Denies nausea and vomiting.  Denies abdominal pain.  Wants to go home.  ROS : Negative for acute chest pain,  afebrile.   Objective: Vital signs in last 24 hours: Vitals:   02/17/20 2034 02/18/20 0509  BP: (!) 152/73 (!) 145/78  Pulse: 84 95  Resp: 20 20  Temp: 98.1 F (36.7 C) 97.8 F (36.6 C)  SpO2: 95% 94%    Physical Exam:  General:  Alert, cooperative, no distress, appears stated age  Head:  Normocephalic, without obvious abnormality, atraumatic  Eyes:  , EOM's intact,   Lungs:    No visible respiratory distress.  Heart:  Regular rate and rhythm, S1, S2 normal  Abdomen:    Abdomen is mildly distended, nontender, bowel sounds present.  No peritoneal signs  Extremities: Extremities normal, atraumatic, trace edema       Lab Results: Recent Labs    02/17/20 0142 02/18/20 0310  NA 142 140  K 3.5 3.3*  CL 112* 111  CO2 23 22  GLUCOSE 97 98  BUN 16 11  CREATININE 1.11 1.10  CALCIUM 8.0* 7.9*   No results for input(s): AST, ALT, ALKPHOS, BILITOT, PROT, ALBUMIN in the last 72 hours. Recent Labs    02/17/20 0142 02/18/20 0310  WBC 9.7 10.0  HGB 9.9* 9.8*  HCT 30.1* 29.6*  MCV 94.4 95.8  PLT 537* 424*   Recent Labs    02/16/20 0848  LABPROT 15.8*  INR 1.3*      Assessment/Plan: -GI bleed with blood loss anemia.  EGD showed 2 nonbleeding duodenal ulcers.  Colonoscopy showed red blood throughout the colon.  Capsule endoscopy was performed but not completed because of technical issues. -Acute blood loss anemia.  Hemoglobin stable.  Has received 6 units of blood transfusion so far. -History of ulcerative colitis. -Chronic heart failure. -Polycythemia vera  Recommendations ------------------------ -X-ray report reviewed.  Capsule has passed from GI  tract.  -Recent bleeding scan was negative.  No further bleeding episodes.  Hemoglobin stable.  Do not think there is a need for repeat capsule endoscopy at this point.   -Continue IV twice daily PPI while in the hospital, switch to oral pantoprazole 40 mg twice a day for 4 weeks on discharge followed by Protonix 40 mg once a day for another 4 weeks.  -GI will sign off.  Call us back if needed.  Okay to discharge from GI standpoint.  -Follow-up with [facility]/ Dr. Watt Climes in 2 months after discharge.  Otis Brace MD, [place] 02/18/2020, 9:01 AM  Contact #  [phone]

## 2020-02-18 NOTE — Care Management Important Message (Signed)
Important Message  Patient Details  Name: William Villanueva MRN: [id number] Date of Birth: Jul 27, 1950   Medicare Important Message Given:  Yes - Important Message mailed due to current National Emergency  Verbal consent obtained due to current National Emergency  Relationship to patient: Self Contact Name: Rayne Loiseau Call Date: 02/18/20  Time: 1287 Phone: [phone] Outcome: No Answer/Busy Important Message mailed to: Patient address on file    Delorse Lek 02/18/2020, 9:53 AM

## 2020-02-18 NOTE — Discharge Summary (Addendum)
Physician Discharge Summary  Lander Eslick GQB:[id number] DOB: [birth year] DOA: 02/12/2020  PCP: Antony Contras, MD  Admit date: 02/12/2020 Discharge date: 02/18/2020  Admitted From: Home Disposition:  Home  Recommendations for Outpatient Follow-up:  1. Follow up with GI in 1-2 weeks 2. Please obtain BMP/CBC in one week   Home Health:No Equipment/Devices:None  Discharge Condition:Stable CODE STATUS:Full Diet recommendation: Heart Healthy  Brief/Interim Summary: 69 y.o. 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 comes in with ongoing nausea and vomiting for the past 3 weeks was admitted for gastroenteritis hypovolemia and hypotension was found in acute kidney injury and metabolic acidosis in the ED somewhat responded to IV fluids, on 02/14/2020 had a near syncopal episode on the floor PCCM was consulted who began IV fluids eventually had a bloody loose stool hemoglobin dropped from 13-10   Discharge Diagnoses:  Principal Problem:   Gastroenteritis, acute Active Problems:   Polycythemia vera ([place])   Back pain   Hemochromatosis   AKI (acute kidney injury) ([place])   Hypokalemia   Diarrhea   Generalized abdominal pain   Thrombocytosis   Hemorrhagic shock ([facility])   Acute blood loss anemia   Postural dizziness with presyncope   Ulcerative colitis, acute, unspecified complication ([place])  Hemorrhagic shock/acute blood loss anemia secondary to GI bleed: Question possible ulcerative colitis flare. Status post 7 units of packed red blood cells, on admission he was started on IV Protonix GI was consulted to perform an EGD and colonoscopy did not reveal source of bleeding,  capsule endoscopy was Also Done That Did Not Reveal Source, His Hemoglobin Remained Stable after transfusion, his lower GI bleed resolved. He will follow up with Dr. Watt Climes as an outpatient.  Diarrhea  likely due to ulcerative colitis flare: Patient and advise medication at home he was started on budesonide and sulfadiazine diarrhea resolved. He will continue current regimen at home.  History of obstructive sleep apnea: No changes to his BiPAP.  Presyncope: Likely due to hemorrhagic shock now resolved.  Acute kidney injury: Likely prerenal in the setting of hemorrhagic shock use and Entresto use Entresto as were held, resolved with IV fluid hydration.  Chronic systolic heart failure: On admission Hypertensive medications were held due to his shock as his blood pressure improved they were resumed no changes made to his home regimen.  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  Coagulopathy: He was given vitamin K his INR the day prior to discharge 1.3.  Electrolyte imbalance: They were repleted as needed his potassium was critically greater than 4 magnesium greater than 2.    Discharge Instructions  Discharge Instructions    Diet - low sodium heart healthy   Complete by: As directed    Increase activity slowly   Complete by: As directed    No wound care   Complete by: As directed      Allergies as of 02/18/2020      Reactions   Bupropion Hives   Cephalexin Diarrhea, Other (See Comments)   Caused C-diff, also   Fluoxetine Itching   Ibuprofen Itching   Prednisone Itching, Other (See Comments)   Abdominal pain, also   Temazepam Other (See Comments)   Dizziness    Trazodone And Nefazodone Other (See Comments)   Dizziness   Fluoxetine Hcl Itching   Prozac [fluoxetine Hcl] Itching  Medication List    STOP taking these medications   sulfaSALAzine 500 MG tablet Commonly known as: AZULFIDINE Replaced by: sulfaSALAzine 500 MG EC tablet     TAKE these medications   allopurinol 300 MG tablet Commonly known as: ZYLOPRIM Take 300 mg by mouth  daily.   aspirin 81 MG tablet Take 81 mg by mouth at bedtime.   BD Pen Needle Nano U/F 32G X 4 MM Misc Generic drug: Insulin Pen Needle   budesonide 3 MG 24 hr capsule Commonly known as: ENTOCORT EC Take 3 capsules (9 mg total) by mouth daily.   carvedilol 12.5 MG tablet Commonly known as: COREG Take 1 tablet (12.5 mg total) by mouth 2 (two) times daily with a meal.   citalopram 10 MG tablet Commonly known as: CELEXA Take 5 mg by mouth daily.   Entresto 24-26 MG Generic drug: sacubitril-valsartan TAKE ONE TABLET BY MOUTH TWICE A DAY   febuxostat 40 MG tablet Commonly known as: ULORIC Take 80 mg by mouth daily.   folic acid 1 MG tablet Commonly known as: FOLVITE Take 1 mg by mouth daily.   furosemide 40 MG tablet Commonly known as: LASIX Take 1 tablet (40 mg total) by mouth daily.   hydroxyurea 500 MG capsule Commonly known as: HYDREA TAKE TWO CAPSULES BY MOUTH DAILY What changed:   how much to take  when to take this   methotrexate 2.5 MG tablet Take 12.5 mg by mouth See admin instructions. Take 12.5 mg by mouth in the morning and 12.5 mg in the evening ONLY ON TUESDAYS   omeprazole 20 MG capsule Commonly known as: PRILOSEC Take 20 mg by mouth 2 (two) times daily before a meal.   ondansetron 4 MG disintegrating tablet Commonly known as: ZOFRAN-ODT Take 4 mg by mouth every 8 (eight) hours as needed for nausea or vomiting.   oxybutynin 10 MG 24 hr tablet Commonly known as: DITROPAN-XL Take 10 mg by mouth daily.   rosuvastatin 5 MG tablet Commonly known as: CRESTOR Take 5 mg by mouth daily.   spironolactone 25 MG tablet Commonly known as: ALDACTONE Take 0.5 tablets (12.5 mg total) by mouth daily.   sulfaSALAzine 500 MG EC tablet Commonly known as: AZULFIDINE Take 4 tablets (2,000 mg total) by mouth 2 (two) times daily. Replaces: sulfaSALAzine 500 MG tablet   tamsulosin 0.4 MG Caps capsule Commonly known as: FLOMAX Take 0.4 mg by mouth 2 (two)  times daily.   Tyler Aas FlexTouch 100 UNIT/ML FlexTouch Pen Generic drug: insulin degludec Inject 20 Units into the skin daily after breakfast.       Follow-up Information    Clarene Essex, MD. Schedule an appointment as soon as possible for a visit in 2 month(s).   Specialty: Gastroenterology Why: Follow-up for GI bleed Contact information: [address] [place] [address] [phone]              Allergies  Allergen Reactions  . Bupropion Hives  . Cephalexin Diarrhea and Other (See Comments)    Caused C-diff, also   . Fluoxetine Itching  . Ibuprofen Itching  . Prednisone Itching and Other (See Comments)    Abdominal pain, also   . Temazepam Other (See Comments)    Dizziness   . Trazodone And Nefazodone Other (See Comments)    Dizziness  . Fluoxetine Hcl Itching  . Prozac [Fluoxetine Hcl] Itching    Consultations:  Gastroenterology  Pulmonary and critical care   Procedures/Studies: CT ABDOMEN PELVIS  WO CONTRAST  Result Date: 02/14/2020 CLINICAL DATA:  GI bleed, sudden drop in hemoglobin 14-10, concern for retroperitoneal hemorrhage EXAM: CT ABDOMEN AND PELVIS WITHOUT CONTRAST TECHNIQUE: Multidetector CT imaging of the abdomen and pelvis was performed following the standard protocol without IV contrast. COMPARISON:  CT 02/12/2020 FINDINGS: Lower chest: Some increasing atelectatic changes in the left lung base. Normal cardiac size. Pacer leads seen terminating in the right atrium, right ventricular apex, and coronary sinus. Hypoattenuation of the cardiac blood pool compatible with anemia. No pericardial effusion. Hepatobiliary: No visible focal liver lesion on this unenhanced CT. Normal liver attenuation with smooth surface contour. Some layering attenuation within the gallbladder may reflect biliary sludge. No pericholecystic fluid or inflammation. No biliary ductal dilatation or calcified gallstone. Pancreas: Partial fatty replacement of the  pancreas. No pancreatic ductal dilatation or surrounding inflammatory changes. Spleen: Borderline splenomegaly.  No concerning splenic lesions. Adrenals/Urinary Tract: Normal adrenal glands. Kidneys are symmetric in size and normally located. Mild symmetric bilateral perinephric stranding, a nonspecific finding which may correlate with advanced age or decreased renal function. Several fluid attenuation cysts seen in the bilateral kidneys, unchanged from comparison. No concerning renal mass, urolithiasis or hydronephrosis. Urinary bladder is unremarkable aside from indentation of the bladder base by an enlarged prostate. Stomach/Bowel: Distal esophagus, stomach and duodenal sweep are unremarkable. No small bowel wall thickening or dilatation. No evidence of obstruction. Much of the colon appears fluid-filled with lack of formed stool compatible with a diarrheal illness. Scattered colonic diverticula but without focal diverticular inflammation to suggest acute diverticulitis. A normal appendix is visualized. Vascular/Lymphatic: Atherosclerotic calcifications within the abdominal aorta and branch vessels. No aneurysm or ectasia. No enlarged abdominopelvic lymph nodes. Reproductive: Slight median lobe hypertrophy with indentation of the bladder base. Few coarse likely benign calcifications within the otherwise unremarkable prostate. Seminal vesicles are unremarkable. Other: No retroperitoneal or body wall hematoma. No free abdominopelvic air or fluid. No worrisome adenopathy. Foci of subcutaneous fat stranding along the low anterior abdominal wall likely related to injectable use. Musculoskeletal: No acute osseous abnormality or suspicious osseous lesion. Multilevel degenerative changes are present in the imaged portions of the spine. Multilevel flowing anterior osteophytosis, compatible with features of diffuse idiopathic skeletal hyperostosis (DISH). Additional moderate degenerative changes in the bilateral hips with  enthesopathic changes about the pelvis and proximal femora. No large intramuscular hematoma. IMPRESSION: 1. No retroperitoneal hematoma or body wall hematoma. 2. Much of the colon appears fluid-filled with lack of formed stool compatible with a diarrheal illness or rapid transit state which can be seen in the setting of GI bleed. 3. Colonic diverticulosis without evidence of acute diverticulitis. No other focal bowel thickening or inflammation. 4. Mild symmetric bilateral perinephric stranding, a nonspecific finding which may correlate with advanced age or decreased renal function. 5. Borderline splenomegaly. 6. Aortic Atherosclerosis (ICD10-I70.0). 7. Hypoattenuation of the cardiac blood pool suggestive of anemia. Electronically Signed   By: Lovena Le M.D.   On: 02/14/2020 16:43   CT ABDOMEN PELVIS WO CONTRAST  Result Date: 02/12/2020 CLINICAL DATA:  Abdominal pain EXAM: CT ABDOMEN AND PELVIS WITHOUT CONTRAST TECHNIQUE: Multidetector CT imaging of the abdomen and pelvis was performed following the standard protocol without IV contrast. COMPARISON:  CT 02/04/2020 FINDINGS: Lower chest: Lung bases demonstrate no acute consolidation or effusion. Partially visualized intracardiac pacing leads. Hepatobiliary: No focal liver abnormality is seen. No gallstones, gallbladder wall thickening, or biliary dilatation. Pancreas: Unremarkable. No pancreatic ductal dilatation or surrounding inflammatory changes. Spleen: Enlarged with craniocaudal measurement of  15 cm. Adrenals/Urinary Tract: Adrenal glands are normal. No hydronephrosis. Low-density renal lesions likely cysts. Bladder is normal Stomach/Bowel: Stomach is within normal limits. Appendix not well seen but no right lower quadrant inflammatory process. Left colon diverticula without acute inflammatory change. No evidence of bowel wall thickening, distention, or inflammatory changes. Vascular/Lymphatic: Mild to moderate aortic atherosclerosis. No aneurysm. No  suspicious adenopathy. Reproductive: Prostate calcification. Other: Negative for free air or free fluid. Musculoskeletal: Degenerative changes. No acute or suspicious osseous abnormality. IMPRESSION: 1. No CT evidence for acute intra-abdominal or pelvic abnormality. 2. Splenomegaly. 3. 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 Aortic Atherosclerosis (ICD10-I70.0). Electronically Signed   By: Donavan Foil M.D.   On: 02/12/2020 21:10   NM GI Blood Loss  Result Date: 02/15/2020 CLINICAL DATA:  GI bleed. EXAM: NUCLEAR MEDICINE GASTROINTESTINAL BLEEDING SCAN TECHNIQUE: Sequential abdominal images were obtained following intravenous administration of Tc-50mlabeled red blood cells. RADIOPHARMACEUTICALS:  24 mCi Tc-929mertechnetate in-vitro labeled red cells. COMPARISON:  CT 1 day prior FINDINGS: There is no active gastrointestinal bleeding identified on this study. Normal radiotracer distribution is noted. IMPRESSION: No evidence for active GI bleeding. Electronically Signed   By: ChConstance Holster.D.   On: 02/15/2020 01:55   CT ABDOMEN PELVIS W CONTRAST  Result Date: 02/04/2020 CLINICAL DATA:  Abdominal distension with elevated lipase. EXAM: CT ABDOMEN AND PELVIS WITH CONTRAST TECHNIQUE: Multidetector CT imaging of the abdomen and pelvis was performed using the standard protocol following bolus administration of intravenous contrast. CONTRAST:  1005mMNIPAQUE IOHEXOL 300 MG/ML  SOLN COMPARISON:  CT July 03, 2017 FINDINGS: Lower chest: No acute abnormality. Hepatobiliary: Hepatic steatosis. The liver is mildly enlarged, measuring up to 19 cm in craniocaudal dimension. No focal liver lesion identified. The gallbladder is nondistended. No gallbladder wall thickening or evidence of pericholecystic fluid. No radiodense gallstones. Pancreas: Similar fatty replacement of the pancreatic head. No peripancreatic fat stranding or fluid collection. No pancreatic ductal dilation. Spleen:  Mild splenomegaly, measuring up to 13.5 cm. No splenic mass. Adrenals/Urinary Tract: Normal appearance of the adrenal glands. No hydronephrosis. There are multiple bilateral low-attenuation renal lesions, which were better characterized on ultrasound from 07/01/2018 as cysts. Some of these lesions are too small to characterize by CT. Stomach/Bowel: Stomach is within normal limits. Appendix is not visualized. No evidence of bowel wall thickening, distention, or inflammatory changes. Colonic diverticulosis without evidence of diverticulitis. Vascular/Lymphatic: Calcific atherosclerosis of the aorta and its branch vessels. No significant vascular findings are present. No enlarged abdominal or pelvic lymph nodes. Reproductive: Nonspecific prostatic calcifications. Other: No abdominal wall hernia or abnormality. No abdominopelvic ascites. Musculoskeletal: No acute fracture identified. Multilevel degenerative changes of the lumbar spine. IMPRESSION: 1. No acute findings. No pancreatic inflammatory changes or peripancreatic fluid collection. 2. Mild hepatic steatosis and mild hepatosplenomegaly. Electronically Signed   By: FreMargaretha Sheffield   On: 02/04/2020 20:09   DG Chest Portable 1 View  Result Date: 02/04/2020 CLINICAL DATA:  Nausea, vomiting, diarrhea and cough x3 weeks. EXAM: PORTABLE CHEST 1 VIEW COMPARISON:  June 27, 2019 FINDINGS: There is a dual lead AICD. There is no evidence of acute infiltrate, pleural effusion or pneumothorax. The heart size and mediastinal contours are within normal limits. A prominent pulmonary vessel is again seen along the infrahilar region on the right. The visualized skeletal structures are unremarkable. IMPRESSION: No active cardiopulmonary disease. Electronically Signed   By: ThaVirgina NorfolkD.   On: 02/04/2020 15:17   DG Abd 2 Views  Result Date: 02/18/2020 CLINICAL DATA:  Ingested foreign body. EXAM: ABDOMEN - 2 VIEW COMPARISON:  February 17, 2020. FINDINGS: The  bowel gas pattern is normal. There is no evidence of free air. No radio-opaque calculi or other significant radiographic abnormality is seen. Endoscopy capsule is no longer visualized. IMPRESSION: Endoscopy capsule is no longer visualized. No other abnormality seen in the abdomen. Electronically Signed   By: Marijo Conception M.D.   On: 02/18/2020 08:33   DG Abd Portable 1V  Result Date: 02/16/2020 CLINICAL DATA:  PillCam. EXAM: PORTABLE ABDOMEN - 1 VIEW COMPARISON:  None. FINDINGS: The bowel gas pattern is normal. Metallic density is seen in the pelvis consistent with history of ingested endoscopy capsule. No radio-opaque calculi or other significant radiographic abnormality are seen. IMPRESSION: Metallic density seen in pelvis consistent with history of ingested endoscopy capsule. Electronically Signed   By: Marijo Conception M.D.   On: 02/16/2020 13:50   DG Abd Portable 2V  Result Date: 02/17/2020 CLINICAL DATA:  Capsule endoscope study. EXAM: PORTABLE ABDOMEN - 2 VIEW COMPARISON:  02/16/2020 FINDINGS: Nonspecific bowel gas pattern. Capsule endoscope overlies the low midline pelvis, likely in the rectum. Visualized bony anatomy unremarkable. IMPRESSION: Capsule endoscope again noted low midline anatomic pelvis, likely but not definitely in the rectum. Electronically Signed   By: Misty Stanley M.D.   On: 02/17/2020 08:07   CUP PACEART INCLINIC DEVICE CHECK  Result Date: 02/09/2020 CRT-P device check in clinic. Normal device function. Thresholds, sensing, impedance consistent with previous measurements. Histograms appropriate for patient and level of activity. 19 mode switches, appear AT contolled VR, longest 8 seconds. No ventricular high rate episodes. Patient bi-ventricularly pacing 97% of the time. Device programmed with appropriate safety margins. Device heart failure diagnostics elevating with CorVue trending upwards. SK made aware. Estimated longevity 5.2 years. Patient enrolled in remote follow-up  02/23/20. Plan to check device remotely in 3 months and every 6 months in office. ROV w/ SK.Lavenia Atlas, BSN, RN  VAS [place] UPPER EXTREMITY VENOUS DUPLEX  Result Date: 02/17/2020 UPPER VENOUS STUDY  Indications: Pain, Swelling, and Pain and swelling in left arm after several IV sticks. Comparison Study: No prior. Performing Technologist: Oda Cogan RDMS, RVT  Examination Guidelines: A complete evaluation includes B-mode imaging, spectral Doppler, color Doppler, and power Doppler as needed of all accessible portions of each vessel. Bilateral testing is considered an integral part of a complete examination. Limited examinations for reoccurring indications may be performed as noted.  Left Findings: +----------+------------+---------+-----------+----------+-------+ LEFT      CompressiblePhasicitySpontaneousPropertiesSummary +----------+------------+---------+-----------+----------+-------+ IJV           Full       Yes       Yes                      +----------+------------+---------+-----------+----------+-------+ Subclavian    Full       Yes       Yes                      +----------+------------+---------+-----------+----------+-------+ Axillary      Full       Yes       Yes                      +----------+------------+---------+-----------+----------+-------+ Brachial      Full                                          +----------+------------+---------+-----------+----------+-------+  Cephalic      None       No        No                Acute  +----------+------------+---------+-----------+----------+-------+ Basilic       Full                                          +----------+------------+---------+-----------+----------+-------+ Cephalic vein thrombosis extending from the mid forearm to the upper mid arm.  Summary:  Right: No evidence of thrombosis in the subclavian.  Left: No evidence of deep vein thrombosis in the upper extremity. Findings consistent  with acute superficial vein thrombosis involving the left cephalic vein.  *See table(s) above for measurements and observations.  Diagnosing physician: Deitra Mayo MD Electronically signed by Deitra Mayo MD on 02/17/2020 at 2:18:27 PM.    Final     Subjective: No new complaints.  Discharge Exam: Vitals:   02/17/20 2034 02/18/20 0509  BP: (!) 152/73 (!) 145/78  Pulse: 84 95  Resp: 20 20  Temp: 98.1 F (36.7 C) 97.8 F (36.6 C)  SpO2: 95% 94%   Vitals:   02/17/20 1811 02/17/20 1900 02/17/20 2034 02/18/20 0509  BP:   (!) 152/73 (!) 145/78  Pulse: 98 88 84 95  Resp: 20 (!) 21 20 20   Temp:   98.1 F (36.7 C) 97.8 F (36.6 C)  TempSrc:   Oral   SpO2: 96% 96% 95% 94%  Weight:      Height:        General: Pt is alert, awake, not in acute distress Cardiovascular: RRR, S1/S2 +, no rubs, no gallops Respiratory: CTA bilaterally, no wheezing, no rhonchi Abdominal: Soft, NT, ND, bowel sounds + Extremities: no edema, no cyanosis    The results of significant diagnostics from this hospitalization (including imaging, microbiology, ancillary and laboratory) are listed below for reference.     Microbiology: Recent Results (from the past 240 hour(s))  Gastrointestinal Panel by PCR , Stool     Status: None   Collection Time: 02/12/20  7:23 PM   Specimen: Stool  Result Value Ref Range Status   Campylobacter species NOT DETECTED NOT DETECTED Final   Plesimonas shigelloides NOT DETECTED NOT DETECTED Final   Salmonella species NOT DETECTED NOT DETECTED Final   Yersinia enterocolitica NOT DETECTED NOT DETECTED Final   Vibrio species NOT DETECTED NOT DETECTED Final   Vibrio cholerae NOT DETECTED NOT DETECTED Final   Enteroaggregative E coli (EAEC) NOT DETECTED NOT DETECTED Final   Enteropathogenic E coli (EPEC) NOT DETECTED NOT DETECTED Final   Enterotoxigenic E coli (ETEC) NOT DETECTED NOT DETECTED Final   Shiga like toxin producing E coli (STEC) NOT DETECTED NOT  DETECTED Final   Shigella/Enteroinvasive E coli (EIEC) NOT DETECTED NOT DETECTED Final   Cryptosporidium NOT DETECTED NOT DETECTED Final   Cyclospora cayetanensis NOT DETECTED NOT DETECTED Final   Entamoeba histolytica NOT DETECTED NOT DETECTED Final   Giardia lamblia NOT DETECTED NOT DETECTED Final   Adenovirus F40/41 NOT DETECTED NOT DETECTED Final   Astrovirus NOT DETECTED NOT DETECTED Final   Norovirus GI/GII NOT DETECTED NOT DETECTED Final   Rotavirus A NOT DETECTED NOT DETECTED Final   Sapovirus (I, II, IV, and V) NOT DETECTED NOT DETECTED Final    Comment: Performed at [facility], [address] [place], [place] [address]  C Difficile Quick Screen w PCR reflex     Status: None   Collection Time: 02/12/20  7:23 PM   Specimen: Stool  Result Value Ref Range Status   C Diff antigen NEGATIVE NEGATIVE Final   C Diff toxin NEGATIVE NEGATIVE Final   C Diff interpretation No C. difficile detected.  Final    Comment: Performed at [facility], [place] [address], [place], [place] [address]  Respiratory Panel by RT PCR (Flu A&B, Covid) - Nasopharyngeal Swab     Status: None   Collection Time: 02/12/20 10:24 PM   Specimen: Nasopharyngeal Swab  Result Value Ref Range Status   SARS Coronavirus 2 by RT PCR NEGATIVE NEGATIVE Final    Comment: (NOTE) SARS-CoV-2 target nucleic acids are NOT DETECTED.  The SARS-CoV-2 RNA is generally detectable in upper respiratoy specimens during the acute phase of infection. The lowest concentration of SARS-CoV-2 viral copies this assay can detect is 131 copies/mL. A negative result does not preclude SARS-Cov-2 infection and should not be used as the sole basis for treatment or other patient management decisions. A negative result may occur with  improper specimen collection/handling, submission of specimen other than nasopharyngeal swab, presence of viral mutation(s) within the areas targeted by this assay, and inadequate number of viral  copies (<131 copies/mL). A negative result must be combined with clinical observations, patient history, and epidemiological information. The expected result is Negative.  Fact Sheet for Patients:  PinkCheek.be  Fact Sheet for Healthcare Providers:  GravelBags.it  This test is no t yet approved or cleared by the [place] FDA and  has been authorized for detection and/or diagnosis of SARS-CoV-2 by FDA under an Emergency Use Authorization (EUA). This EUA will remain  in effect (meaning this test can be used) for the duration of the COVID-19 declaration under Section 564(b)(1) of the Act, 21 U.S.C. section 360bbb-3(b)(1), unless the authorization is terminated or revoked sooner.     Influenza A by PCR NEGATIVE NEGATIVE Final   Influenza B by PCR NEGATIVE NEGATIVE Final    Comment: (NOTE) The Xpert Xpress SARS-CoV-2/FLU/RSV assay is intended as an aid in  the diagnosis of influenza from Nasopharyngeal swab specimens and  should not be used as a sole basis for treatment. Nasal washings and  aspirates are unacceptable for Xpert Xpress SARS-CoV-2/FLU/RSV  testing.  Fact Sheet for Patients: PinkCheek.be  Fact Sheet for Healthcare Providers: GravelBags.it  This test is not yet approved or cleared by the [place] FDA and  has been authorized for detection and/or diagnosis of SARS-CoV-2 by  FDA under an Emergency Use Authorization (EUA). This EUA will remain  in effect (meaning this test can be used) for the duration of the  Covid-19 declaration under Section 564(b)(1) of the Act, 21  U.S.C. section 360bbb-3(b)(1), unless the authorization is  terminated or revoked. Performed at [facility], [place] [address] [place],  [address]   MRSA PCR Screening     Status: None   Collection Time: 02/16/20  7:46 AM   Specimen: Nasopharyngeal  Result Value Ref  Range Status   MRSA by PCR NEGATIVE NEGATIVE Final    Comment:        The GeneXpert MRSA Assay (FDA approved for NASAL specimens only), is one component of a comprehensive MRSA colonization surveillance program. It is not intended to diagnose MRSA infection nor to guide or monitor treatment for MRSA infections. Performed at [facility], [place] [address], [place], [place]  [place]: BNP (last 3 results) Recent Labs    02/04/20 1915 02/12/20 1853  BNP 74.6 59.7   Basic Metabolic Panel: Recent Labs  Lab 02/12/20 2118 02/12/20 2313 02/14/20 0813 02/14/20 0813 02/14/20 1615 02/15/20 1524 02/16/20 0332 02/17/20 0142 02/18/20 0310  NA  --    < > 136   < > 137 140 141 142 140  K  --    < > 3.2*   < > 4.3 3.7 3.5 3.5 3.3*  CL  --    < > 105   < > 110 112* 112* 112* 111  CO2  --    < > 21*   < > 18* 19* 20* 23 22  GLUCOSE  --    < > 184*   < > 130* 103* 89 97 98  BUN  --    < > 46*   < > 49* 46* 31* 16 11  CREATININE  --    < > 2.01*   < > 1.60* 1.58* 1.21 1.11 1.10  CALCIUM  --    < > 8.0*   < > 7.1* 7.8* 7.7* 8.0* 7.9*  MG 1.6*  --  1.9  --   --   --   --   --   --   PHOS  --   --  2.4*  --   --   --   --   --   --    < > = values in this interval not displayed.   Liver Function Tests: Recent Labs  Lab 02/12/20 1841 02/13/20 0320  AST 18 21  ALT 7 11  ALKPHOS 64 61  BILITOT 0.3 0.7  PROT 6.3* 6.3*  ALBUMIN 3.4* 3.2*   Recent Labs  Lab 02/12/20 1841  LIPASE 63*  AMYLASE 116*   No results for input(s): AMMONIA in the last 168 hours. CBC: Recent Labs  Lab 02/12/20 1841 02/12/20 2313 02/15/20 1524 02/16/20 0332 02/16/20 1507 02/17/20 0142 02/18/20 0310  WBC 11.0*   < > 11.5* 9.1 9.1 9.7 10.0  NEUTROABS 8.9*  --   --   --   --   --   --   HGB 13.7   < > 9.2* 9.8* 9.8* 9.9* 9.8*  HCT 41.0   < > 27.8* 29.2* 29.1* 30.1* 29.6*  MCV 104.3*   < > 94.6 93.6 92.4 94.4 95.8  PLT 913*   < > 752* 583* 581* 537* 424*   < > = values in this  interval not displayed.   Cardiac Enzymes: No results for input(s): CKTOTAL, CKMB, CKMBINDEX, TROPONINI in the last 168 hours. BNP: Invalid input(s): POCBNP CBG: Recent Labs  Lab 02/17/20 0620 02/17/20 1136 02/17/20 1623 02/17/20 2033 02/18/20 0649  GLUCAP 91 153* 94 96 107*   D-Dimer No results for input(s): DDIMER in the last 72 hours. Hgb A1c No results for input(s): HGBA1C in the last 72 hours. Lipid Profile No results for input(s): CHOL, HDL, LDLCALC, TRIG, CHOLHDL, LDLDIRECT in the last 72 hours. Thyroid function studies No results for input(s): TSH, T4TOTAL, T3FREE, THYROIDAB in the last 72 hours.  Invalid input(s): FREET3 Anemia work up No results for input(s): VITAMINB12, FOLATE, FERRITIN, TIBC, IRON, RETICCTPCT in the last 72 hours. Urinalysis    Component Value Date/Time   COLORURINE YELLOW 02/04/2020 2158   APPEARANCEUR CLEAR 02/04/2020 2158   LABSPEC <1.005 (L) 02/04/2020 2158   LABSPEC 1.020 10/08/2015 0903   PHURINE 5.5  02/04/2020 2158   GLUCOSEU NEGATIVE 02/04/2020 2158   HGBUR NEGATIVE 02/04/2020 2158   BILIRUBINUR NEGATIVE 02/04/2020 2158   KETONESUR NEGATIVE 02/04/2020 2158   PROTEINUR NEGATIVE 02/04/2020 2158   UROBILINOGEN 0.2 10/08/2015 0903   NITRITE NEGATIVE 02/04/2020 2158   LEUKOCYTESUR NEGATIVE 02/04/2020 2158   Sepsis Labs Invalid input(s): PROCALCITONIN,  WBC,  LACTICIDVEN Microbiology Recent Results (from the past 240 hour(s))  Gastrointestinal Panel by PCR , Stool     Status: None   Collection Time: 02/12/20  7:23 PM   Specimen: Stool  Result Value Ref Range Status   Campylobacter species NOT DETECTED NOT DETECTED Final   Plesimonas shigelloides NOT DETECTED NOT DETECTED Final   Salmonella species NOT DETECTED NOT DETECTED Final   Yersinia enterocolitica NOT DETECTED NOT DETECTED Final   Vibrio species NOT DETECTED NOT DETECTED Final   Vibrio cholerae NOT DETECTED NOT DETECTED Final   Enteroaggregative E coli (EAEC) NOT  DETECTED NOT DETECTED Final   Enteropathogenic E coli (EPEC) NOT DETECTED NOT DETECTED Final   Enterotoxigenic E coli (ETEC) NOT DETECTED NOT DETECTED Final   Shiga like toxin producing E coli (STEC) NOT DETECTED NOT DETECTED Final   Shigella/Enteroinvasive E coli (EIEC) NOT DETECTED NOT DETECTED Final   Cryptosporidium NOT DETECTED NOT DETECTED Final   Cyclospora cayetanensis NOT DETECTED NOT DETECTED Final   Entamoeba histolytica NOT DETECTED NOT DETECTED Final   Giardia lamblia NOT DETECTED NOT DETECTED Final   Adenovirus F40/41 NOT DETECTED NOT DETECTED Final   Astrovirus NOT DETECTED NOT DETECTED Final   Norovirus GI/GII NOT DETECTED NOT DETECTED Final   Rotavirus A NOT DETECTED NOT DETECTED Final   Sapovirus (I, II, IV, and V) NOT DETECTED NOT DETECTED Final    Comment: Performed at [facility], [place] [place], [place] [address]  C Difficile Quick Screen w PCR reflex     Status: None   Collection Time: 02/12/20  7:23 PM   Specimen: Stool  Result Value Ref Range Status   C Diff antigen NEGATIVE NEGATIVE Final   C Diff toxin NEGATIVE NEGATIVE Final   C Diff interpretation No C. difficile detected.  Final    Comment: Performed at [facility], [place] [address] [place], [place] [address]  Respiratory Panel by RT PCR (Flu A&B, Covid) - Nasopharyngeal Swab     Status: None   Collection Time: 02/12/20 10:24 PM   Specimen: Nasopharyngeal Swab  Result Value Ref Range Status   SARS Coronavirus 2 by RT PCR NEGATIVE NEGATIVE Final    Comment: (NOTE) SARS-CoV-2 target nucleic acids are NOT DETECTED.  The SARS-CoV-2 RNA is generally detectable in upper respiratoy specimens during the acute phase of infection. The lowest concentration of SARS-CoV-2 viral copies this assay can detect is 131 copies/mL. A negative result does not preclude SARS-Cov-2 infection and should not be used as the sole basis for treatment or other patient management decisions. A negative  result may occur with  improper specimen collection/handling, submission of specimen other than nasopharyngeal swab, presence of viral mutation(s) within the areas targeted by this assay, and inadequate number of viral copies (<131 copies/mL). A negative result must be combined with clinical observations, patient history, and epidemiological information. The expected result is Negative.  Fact Sheet for Patients:  PinkCheek.be  Fact Sheet for Healthcare Providers:  GravelBags.it  This test is no t yet approved or cleared by the [place] FDA and  has been authorized for detection and/or diagnosis of SARS-CoV-2 by  FDA under an Emergency Use Authorization (EUA). This EUA will remain  in effect (meaning this test can be used) for the duration of the COVID-19 declaration under Section 564(b)(1) of the Act, 21 U.S.C. section 360bbb-3(b)(1), unless the authorization is terminated or revoked sooner.     Influenza A by PCR NEGATIVE NEGATIVE Final   Influenza B by PCR NEGATIVE NEGATIVE Final    Comment: (NOTE) The Xpert Xpress SARS-CoV-2/FLU/RSV assay is intended as an aid in  the diagnosis of influenza from Nasopharyngeal swab specimens and  should not be used as a sole basis for treatment. Nasal washings and  aspirates are unacceptable for Xpert Xpress SARS-CoV-2/FLU/RSV  testing.  Fact Sheet for Patients: PinkCheek.be  Fact Sheet for Healthcare Providers: GravelBags.it  This test is not yet approved or cleared by the [place] FDA and  has been authorized for detection and/or diagnosis of SARS-CoV-2 by  FDA under an Emergency Use Authorization (EUA). This EUA will remain  in effect (meaning this test can be used) for the duration of the  Covid-19 declaration under Section 564(b)(1) of the Act, 21  U.S.C. section 360bbb-3(b)(1), unless the authorization is   terminated or revoked. Performed at [facility], [place] [address] [place], [place] [address]   MRSA PCR Screening     Status: None   Collection Time: 02/16/20  7:46 AM   Specimen: Nasopharyngeal  Result Value Ref Range Status   MRSA by PCR NEGATIVE NEGATIVE Final    Comment:        The GeneXpert MRSA Assay (FDA approved for NASAL specimens only), is one component of a comprehensive MRSA colonization surveillance program. It is not intended to diagnose MRSA infection nor to guide or monitor treatment for MRSA infections. Performed at [facility], [place] [address], [place],  [address]      Time coordinating discharge: Over 30 minutes  SIGNED:   Charlynne Cousins, MD  [facility] 02/18/2020, 9:38 AM Pager   If 7PM-7AM, please contact night-coverage [email or web address] Password [email or web address]

## 2020-02-19 ENCOUNTER — Encounter: Payer: Medicare Other | Admitting: Nurse Practitioner

## 2020-02-23 ENCOUNTER — Ambulatory Visit (INDEPENDENT_AMBULATORY_CARE_PROVIDER_SITE_OTHER): Payer: Medicare Other

## 2020-02-23 DIAGNOSIS — I5022 Chronic systolic (congestive) heart failure: Secondary | ICD-10-CM

## 2020-02-23 DIAGNOSIS — Z950 Presence of cardiac pacemaker: Secondary | ICD-10-CM | POA: Diagnosis not present

## 2020-02-23 NOTE — Progress Notes (Signed)
EPIC Encounter for ICM Monitoring  Patient Name: William Villanueva is a 69 y.o. male Date: 02/23/2020 Primary Care Physican: Antony Contras, MD Primary Cardiologist: Stanford Breed Electrophysiologist: Vergie Living Pacing: 94%            01/02/2020: Office weight 305 lbs 02/09/2020 Office Weight: 282 lbs 02/20/2020 Weight: 292 lbs (approximately)  02/23/2020 Weight: 286 lbs                                                           Spoke with patient.  Weight prior to hospitalization was 305 lbs.  He reports losing 6 lbs within the last 3 days.  Denies shortness of breath but hands have been swollen since hospital discharge.  Hospitalized for ulcerative colitis per discharge notes from 10/7-10/13.  Reports good urine output with Furosemide              Wife is concerned patient may get dehydrated on Furosemide.   CorVue thoracic impedance suggesting possible fluid accumulation starting 02/14/2020.    Prescribed:    Furosemide 40 mg take 1 tablet daily.  Patient confirms 10/18 that he is taking daily as prescribed.   Spironolactone 25 mg take 0.5 tablet (12.5 mg total) once a day  Labs: 02/18/2020 Creatinine 1.10, BUN 11, Potassium 3.3, Sodium 140, GFR >60 02/17/2020 Creatinine 1.11, BUN 16, Potassium 3.5, Sodium 142, GFR >60  02/16/2020 Creatinine 1.21, BUN 31, Potassium 3.5, Sodium 141, GFR >60  02/15/2020 Creatinine 1.58, BUN 46, Potassium 3.7, Sodium 140, GFR 44  02/14/2020 Creatinine 1.60, BUN 49, Potassium 4.3, Sodium 137, GFR 44  02/13/2020 Creatinine 2.09, BUN 60, Potassium 3.6, Sodium 133  02/12/2020 Creatinine 2.12, BUN 61, Potassium 3.1, Sodium 133  A complete set of results can be found in Results Review.  Recommendations:  Advised to limit salt intake and drink approximately 64 oz of fluid a day which will keep him hydrated.  Explained will send copy to Dr Stanford Breed for review.   Follow-up plan: ICM clinic phone appointment on 03/02/2020 to recheck fluid levels.   91 day  device clinic remote transmission 03/18/2020.    EP/Cardiology Office Visits:  03/10/2020 with Dr Stanford Breed.   Copy of ICM check sent to Dr. Caryl Comes and Dr Stanford Breed for review and recommendations.    3 month ICM trend: 02/23/2020    1 Year ICM trend:       Rosalene Billings, RN 02/23/2020 11:26 AM

## 2020-02-26 ENCOUNTER — Other Ambulatory Visit: Payer: Self-pay | Admitting: Cardiology

## 2020-02-26 DIAGNOSIS — R0602 Shortness of breath: Secondary | ICD-10-CM

## 2020-03-02 ENCOUNTER — Ambulatory Visit (INDEPENDENT_AMBULATORY_CARE_PROVIDER_SITE_OTHER): Payer: Medicare Other

## 2020-03-02 DIAGNOSIS — Z950 Presence of cardiac pacemaker: Secondary | ICD-10-CM

## 2020-03-02 DIAGNOSIS — I5022 Chronic systolic (congestive) heart failure: Secondary | ICD-10-CM

## 2020-03-02 NOTE — Progress Notes (Signed)
EPIC Encounter for ICM Monitoring  Patient Name: William Villanueva is a 69 y.o. male Date: 03/02/2020 Primary Care Physican: Antony Contras, MD Primary Cardiologist:Crenshaw Electrophysiologist:Klein Bi-V Pacing:94% 10/04/2021OfficeWeight: 282lbs 02/20/2020 Weight: 292 lbs (approximately)  02/23/2020 Weight: 286 lbs 02/23/2020 Weight: 275 lbs   Spoke with patient and he is feeling better since losing fluid weight.              CorVue thoracic impedance suggesting fluid levels returned to normal.  Prescribed:   Furosemide40 mg take 1 tablet daily.  Patient confirms 10/18 that he is taking daily as prescribed.   Spironolactone 25 mg take 0.5 tablet (12.5 mg total) once a day  Labs: 02/18/2020 Creatinine 1.10, BUN 11, Potassium 3.3, Sodium 140, GFR >60 02/17/2020 Creatinine 1.11, BUN 16, Potassium 3.5, Sodium 142, GFR >60  02/16/2020 Creatinine 1.21, BUN 31, Potassium 3.5, Sodium 141, GFR >60  02/15/2020 Creatinine 1.58, BUN 46, Potassium 3.7, Sodium 140, GFR 44  02/14/2020 Creatinine 1.60, BUN 49, Potassium 4.3, Sodium 137, GFR 44  02/13/2020 Creatinine 2.09, BUN 60, Potassium 3.6, Sodium 133  02/12/2020 Creatinine 2.12, BUN 61, Potassium 3.1, Sodium 133  A complete set of results can be found in Results Review.  Recommendations:No changes and encouraged to call if experiencing any fluid symptoms.  Follow-up plan: ICM clinic phone appointment on11/29/2021. 91 day device clinic remote transmission 03/18/2020.   EP/Cardiology Office Visits:03/10/2020 with Dr Stanford Breed.  Copy of ICM check sent to [place].   3 month ICM trend: 03/02/2020    1 Year ICM trend:       Rosalene Billings, RN 03/02/2020 2:38 PM

## 2020-03-04 NOTE — Progress Notes (Signed)
HPI: FU cardiomyopathy/CHF. Echocardiogram in August of 2013 showed an ejection fraction of 35-40%. Cardiac catheterization in September of 2013 showed mild nonobstructive coronary disease. There was no hemodynamic evidence of restriction. Pulmonary capillary wedge pressure 10. Cardiac MRI in September of 2013 showed an ejection fraction of 34% with diffuse hypokinesis. There was no hyperenhancement or scar tissue and no evidence of cardiac hemochromatosis. TSH in September 2013 normal. 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 Patient subsequently had CRTP placed. Nuclear study March 2017 showed ejection fraction 32. There was prior inferior infarct but no ischemia. Treated medically as felt likely attenuation artifact. Echocardiogram1/20showed ejection fraction 45-50%. Echocardiogram repeated September 2021 and showed ejection fraction 30 to 34%, grade 2 diastolic dysfunction, moderate tricuspid regurgitation.  Since last seen,patient has some dyspnea.  He denies chest pain or syncope.  No pedal edema.  He has had recent hospitalization for dehydration and GI bleeding.  Current Outpatient Medications  Medication Sig Dispense Refill  . allopurinol (ZYLOPRIM) 300 MG tablet Take 300 mg by mouth daily.    [name] aspirin 81 MG tablet Take 81 mg by mouth at bedtime.     . BD PEN NEEDLE NANO U/F 32G X 4 MM MISC     . carvedilol (COREG) 12.5 MG tablet Take 1 tablet (12.5 mg total) by mouth 2 (two) times daily with a meal. 180 tablet 3  . citalopram (CELEXA) 10 MG tablet Take 5 mg by mouth daily.     [name] ENTRESTO 24-26 MG TAKE ONE TABLET BY MOUTH TWICE A DAY (Patient taking differently: Take 1 tablet by mouth 2 (two) times daily. ) 60 tablet 4  . febuxostat (ULORIC) 40 MG tablet Take 80 mg by mouth daily.     . folic acid (FOLVITE) 1 MG tablet Take 1 mg by mouth daily.    . furosemide (LASIX) 40 MG tablet Take 1 tablet (40 mg total) by mouth daily. 90 tablet 3  . hydroxyurea (HYDREA)  500 MG capsule TAKE TWO CAPSULES BY MOUTH DAILY (Patient taking differently: Take 500 mg by mouth in the morning and at bedtime. ) 180 capsule 5  . insulin degludec (TRESIBA FLEXTOUCH) 100 UNIT/ML SOPN FlexTouch Pen Inject 20 Units into the skin daily after breakfast.     . methotrexate 2.5 MG tablet Take 12.5 mg by mouth See admin instructions. Take 12.5 mg by mouth in the morning and 12.5 mg in the evening ONLY ON TUESDAYS    . omeprazole (PRILOSEC) 20 MG capsule Take 20 mg by mouth 2 (two) times daily before a meal.     . oxybutynin (DITROPAN-XL) 10 MG 24 hr tablet Take 10 mg by mouth daily.    . predniSONE (DELTASONE) 5 MG tablet Take 5 mg by mouth in the morning and at bedtime.    . rosuvastatin (CRESTOR) 5 MG tablet Take 5 mg by mouth daily.    [name] spironolactone (ALDACTONE) 25 MG tablet Take 0.5 tablets (12.5 mg total) by mouth daily. 45 tablet 3  . sulfaSALAzine (AZULFIDINE) 500 MG EC tablet Take 4 tablets (2,000 mg total) by mouth 2 (two) times daily. 60 tablet 0  . tamsulosin (FLOMAX) 0.4 MG CAPS capsule Take 0.4 mg by mouth 2 (two) times daily.      No current facility-administered medications for this visit.   Facility-Administered Medications Ordered in Other Visits  Medication Dose Route Frequency Provider Last Rate Last Admin  . 0.9 %  sodium chloride infusion  Intravenous Continuous Volanda Napoleon, MD   Stopped at 05/16/13 1115     Past Medical History:  Diagnosis Date  . AV block, 2nd degree 2015   St. Jude Allure Quadra pulse generator [id number], model PM 3242  . Back pain   . Cardiomyopathy ([place])    Nonischemic 45%.   . CHF (congestive heart failure) ([place])   . Gout   . Hemochromatosis   . Hypertension   . Hypospadias October 13, 1950   born with  . Nephrolithiasis   . Polycythemia vera(238.4)     Past Surgical History:  Procedure Laterality Date  . ANKLE SURGERY    . BI-VENTRICULAR PACEMAKER INSERTION N/A 04/29/2014   Procedure: BI-VENTRICULAR PACEMAKER INSERTION  (CRT-P);  Surgeon: Deboraha Sprang, MD; Laterality: Left  St. Jude Allure Quadra pulse generator [phone], model PM [phone]  . COLONOSCOPY N/A 02/15/2020   Procedure: COLONOSCOPY;  Surgeon: Ronnette Juniper, MD;  Location: [facility];  Service: Gastroenterology;  Laterality: N/A;  . ESOPHAGOGASTRODUODENOSCOPY (EGD) WITH PROPOFOL N/A 02/15/2020   Procedure: ESOPHAGOGASTRODUODENOSCOPY (EGD) WITH PROPOFOL;  Surgeon: Ronnette Juniper, MD;  Location: [facility];  Service: Gastroenterology;  Laterality: N/A;  . PILONIDAL CYST EXCISION    . POSTERIOR LAMINECTOMY / DECOMPRESSION LUMBAR SPINE    . TONSILLECTOMY      Social History   Socioeconomic History  . Marital status: Married    Spouse name: Not on file  . Number of children: Not on file  . Years of education: Not on file  . Highest education level: Not on file  Occupational History  . Not on file  Tobacco Use  . Smoking status: Former Smoker    Packs/day: 1.00    Years: 44.00    Pack years: 44.00    Types: Cigarettes    Start date: 06/05/1968    Quit date: 04/05/2013    Years since quitting: 6.9  . Smokeless tobacco: Never Used  . Tobacco comment: quit in 2014  Vaping Use  . Vaping Use: Never used  Substance and Sexual Activity  . Alcohol use: Yes    Alcohol/week: 0.0 standard drinks    Comment: rare  . Drug use: No  . Sexual activity: Not Currently  Other Topics Concern  . Not on file  Social History Narrative   Lives at home with wife in a one story home.  Has no children.  Does not work.  Getting workman's comp.  Education: 4 years trade school.    Social Determinants of Health   Financial Resource Strain:   . Difficulty of Paying Living Expenses: Not on file  Food Insecurity:   . Worried About Charity fundraiser in the Last Year: Not on file  . Ran Out of Food in the Last Year: Not on file  Transportation Needs:   . Lack of Transportation (Medical): Not on file  . Lack of Transportation (Non-Medical): Not on file  Physical  Activity:   . Days of Exercise per Week: Not on file  . Minutes of Exercise per Session: Not on file  Stress:   . Feeling of Stress : Not on file  Social Connections:   . Frequency of Communication with Friends and Family: Not on file  . Frequency of Social Gatherings with Friends and Family: Not on file  . Attends Religious Services: Not on file  . Active Member of Clubs or Organizations: Not on file  . Attends Archivist Meetings: Not on file  . Marital Status: Not on file  Intimate Partner Violence:   . Fear of Current or Ex-Partner: Not on file  . Emotionally Abused: Not on file  . Physically Abused: Not on file  . Sexually Abused: Not on file    Family History  Problem Relation Age of Onset  . Heart disease Maternal Grandmother        Pacemaker, MI  . Stroke Mother   . Other Father        Deceased, car fell on him  . Diabetes Sister   . Hypertension Sister     ROS: Generalized fatigue but no fevers or chills, productive cough, hemoptysis, dysphasia, odynophagia, melena, hematochezia, dysuria, hematuria, rash, seizure activity, orthopnea, PND, pedal edema, claudication. Remaining systems are negative.  Physical Exam: Well-developed well-nourished in no acute distress.  Skin is warm and dry.  HEENT is normal.  Neck is supple.  Chest is clear to auscultation with normal expansion.  Cardiovascular exam is regular rate and rhythm.  Abdominal exam nontender or distended. No masses palpated. Extremities show no edema. neuro grossly intact   A/P  1 nonischemic cardiomyopathy-continue Entresto and carvedilol.  Note he was having some orthostatic symptoms at the time of his dehydration/bleeding.  However this appears to have improved.  If he develops worsening problems with dizziness we will decrease carvedilol to 6.25 mg twice daily and follow.  For now we will continue present dose.  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  Continue diuretic at present dose.  Recently noted to have hypokalemia.  Check potassium and renal function.  3 hypertension-patient's blood pressure is controlled today.  Continue present medications and follow.  4 prior CRT-P-followed by Dr. Caryl Comes.  5 morbid obesity-patient will continue efforts at diet, exercise and weight loss.  6 recent GI bleed-patient had minimal coronary disease previously.  Will discontinue aspirin for now and resume later.  Kirk Ruths, MD

## 2020-03-05 ENCOUNTER — Other Ambulatory Visit: Payer: Self-pay | Admitting: Gastroenterology

## 2020-03-05 DIAGNOSIS — R112 Nausea with vomiting, unspecified: Secondary | ICD-10-CM

## 2020-03-05 DIAGNOSIS — R634 Abnormal weight loss: Secondary | ICD-10-CM

## 2020-03-05 DIAGNOSIS — R109 Unspecified abdominal pain: Secondary | ICD-10-CM

## 2020-03-10 ENCOUNTER — Ambulatory Visit: Payer: Medicare Other | Admitting: Cardiology

## 2020-03-10 ENCOUNTER — Other Ambulatory Visit: Payer: Self-pay

## 2020-03-10 ENCOUNTER — Other Ambulatory Visit: Payer: Self-pay | Admitting: Cardiology

## 2020-03-10 ENCOUNTER — Encounter: Payer: Self-pay | Admitting: Cardiology

## 2020-03-10 VITALS — BP 104/52 | HR 99 | Ht 70.0 in | Wt 281.8 lb

## 2020-03-10 DIAGNOSIS — I5022 Chronic systolic (congestive) heart failure: Secondary | ICD-10-CM | POA: Diagnosis not present

## 2020-03-10 NOTE — Patient Instructions (Signed)
Medication Instructions:   STOP ASPIRIN  *If you need a refill on your cardiac medications before your next appointment, please call your pharmacy*   Lab Work:  Your physician recommends that you HAVE LAB WORK TODAY  If you have labs (blood work) drawn today and your tests are completely normal, you will receive your results only by: [name] MyChart Message (if you have MyChart) OR . A paper copy in the mail If you have any lab test that is abnormal or we need to change your treatment, we will call you to review the results.   Follow-Up: At [facility], you and your health needs are our priority.  As part of our continuing mission to provide you with exceptional heart care, we have created designated Provider Care Teams.  These Care Teams include your primary Cardiologist (physician) and Advanced Practice Providers (APPs -  Physician Assistants and Nurse Practitioners) who all work together to provide you with the care you need, when you need it.  We recommend signing up for the patient portal called "MyChart".  Sign up information is provided on this After Visit Summary.  MyChart is used to connect with patients for Virtual Visits (Telemedicine).  Patients are able to view lab/test results, encounter notes, upcoming appointments, etc.  Non-urgent messages can be sent to your provider as well.   To learn more about what you can do with MyChart, go to [email or web address].    Your next appointment:   6 month(s)  The format for your next appointment:   In Person  Provider:   Kirk Ruths, MD

## 2020-03-11 LAB — BASIC METABOLIC PANEL
BUN/Creatinine Ratio: 14 (ref 10–24)
BUN: 14 mg/dL (ref 8–27)
CO2: 23 mmol/L (ref 20–29)
Calcium: 7.7 mg/dL — ABNORMAL LOW (ref 8.6–10.2)
Chloride: 105 mmol/L (ref 96–106)
Creatinine, Ser: 0.99 mg/dL (ref 0.76–1.27)
GFR calc Af Amer: 90 mL/min/{1.73_m2} (ref >59)
GFR calc non Af Amer: 78 mL/min/{1.73_m2} (ref >59)
Glucose: 231 mg/dL — ABNORMAL HIGH (ref 65–99)
Potassium: 4 mmol/L (ref 3.5–5.2)
Sodium: 144 mmol/L (ref 134–144)

## 2020-03-13 ENCOUNTER — Other Ambulatory Visit: Payer: Self-pay | Admitting: Cardiology

## 2020-03-13 DIAGNOSIS — R0602 Shortness of breath: Secondary | ICD-10-CM

## 2020-03-15 ENCOUNTER — Encounter: Payer: Self-pay | Admitting: *Deleted

## 2020-03-15 ENCOUNTER — Telehealth: Payer: Self-pay

## 2020-03-15 NOTE — Telephone Encounter (Signed)
Leigh  there is a 9% burden at high HR and wonder whether it is PMT-- so we need to bring him in   This may be responsible for his worsening LVEF  and so needs to be remedied  Can you bring him in and reprogram his PVARP   Thanks SK

## 2020-03-15 NOTE — Telephone Encounter (Signed)
Merlin alert received 03/15/20 for EGM shows NSVT, 15 seconds w/ rates 170's. Called patient to assess. Patient reports he felt fine that day and unable to recall any issues that day. Denies any issues with fluid retention. Reports compliance with medications including Coreg 12.5 mg BID, Entresto 24-26 mg BID, Lasix 40 mg every other day. Advised patient I will forward to Dr. Caryl Comes for review and we will call with any changes. Verbalize understanding.

## 2020-03-16 NOTE — Telephone Encounter (Signed)
Patient called and scheduled 03/25/20.

## 2020-03-18 ENCOUNTER — Ambulatory Visit (INDEPENDENT_AMBULATORY_CARE_PROVIDER_SITE_OTHER): Payer: Medicare Other

## 2020-03-18 DIAGNOSIS — I441 Atrioventricular block, second degree: Secondary | ICD-10-CM

## 2020-03-18 LAB — CUP PACEART REMOTE DEVICE CHECK
Battery Remaining Longevity: 52 mo
Battery Remaining Percentage: 63 %
Battery Voltage: 2.9 V
Brady Statistic AP VP Percent: 6.8 %
Brady Statistic AP VS Percent: 1 %
Brady Statistic AS VP Percent: 88 %
Brady Statistic AS VS Percent: 1.2 %
Brady Statistic RA Percent Paced: 3.6 %
Date Time Interrogation Session: 20211111020015
Implantable Lead Implant Date: 20151223
Implantable Lead Implant Date: 20151223
Implantable Lead Implant Date: 20151223
Implantable Lead Location: 753858
Implantable Lead Location: 753859
Implantable Lead Location: 753860
Implantable Pulse Generator Implant Date: 20151223
Lead Channel Impedance Value: 400 Ohm
Lead Channel Impedance Value: 410 Ohm
Lead Channel Impedance Value: 560 Ohm
Lead Channel Pacing Threshold Amplitude: 1.125 V
Lead Channel Pacing Threshold Amplitude: 1.375 V
Lead Channel Pacing Threshold Amplitude: 1.5 V
Lead Channel Pacing Threshold Pulse Width: 0.4 ms
Lead Channel Pacing Threshold Pulse Width: 0.4 ms
Lead Channel Pacing Threshold Pulse Width: 0.7 ms
Lead Channel Sensing Intrinsic Amplitude: 12 mV
Lead Channel Sensing Intrinsic Amplitude: 4.7 mV
Lead Channel Setting Pacing Amplitude: 2.125
Lead Channel Setting Pacing Amplitude: 2.375
Lead Channel Setting Pacing Amplitude: 2.5 V
Lead Channel Setting Pacing Pulse Width: 0.4 ms
Lead Channel Setting Pacing Pulse Width: 0.7 ms
Lead Channel Setting Sensing Sensitivity: 2 mV
Pulse Gen Model: 3242
Pulse Gen Serial Number: 7701275

## 2020-03-19 ENCOUNTER — Ambulatory Visit
Admission: RE | Admit: 2020-03-19 | Discharge: 2020-03-19 | Disposition: A | Discharge status: Home / Self Care | Payer: Medicare Other | Source: Ambulatory Visit | Attending: Gastroenterology | Admitting: Gastroenterology

## 2020-03-19 DIAGNOSIS — R634 Abnormal weight loss: Secondary | ICD-10-CM

## 2020-03-19 DIAGNOSIS — R112 Nausea with vomiting, unspecified: Secondary | ICD-10-CM

## 2020-03-19 DIAGNOSIS — R109 Unspecified abdominal pain: Secondary | ICD-10-CM

## 2020-03-19 MED ORDER — IOPAMIDOL (ISOVUE-300) INJECTION 61%
100.0000 mL | Freq: Once | INTRAVENOUS | Status: AC | PRN
  Administered 2020-03-19: 100 mL via INTRAVENOUS

## 2020-03-19 NOTE — Progress Notes (Signed)
Remote pacemaker transmission.   

## 2020-03-24 NOTE — Progress Notes (Signed)
Cardiology Office Note Date:  03/24/2020  Patient ID:  William Villanueva, William Villanueva [birth year], MRN [id number] PCP:  Antony Contras, MD  Cardiologist:  Dr. Stanford Breed Electrophysiologist: Dr. Caryl Comes    Chief Complaint:  ? PMT, remotes with elevated HR,  program PVARP  History of Present Illness: William Villanueva is a 69 y.o. male with history of NICM, CHB, CRT-P, polycythemia vera, hemochromatosis.  In review of notes: Cardiac catheterization in September of 2013 showed mild nonobstructive coronary disease. There was no hemodynamic evidence of restriction. Pulmonary capillary wedge pressure 10.  Cardiac MRI in September of 2013 showed an ejection fraction of 34% with diffuse hypokinesis. There was no hyperenhancement or scar tissue and no evidence of cardiac hemochromatosis   He saw Dr. Caryl Comes 02/09/20, at that time feeling quite poorly with GI illness, suspect to be dehydrated, his diuretic held, noted a change in his EKG and felt there may be room for device optimization, though planned to wait a couple months until better from his GI .  He was subsequently hospitalized 02/12/20 with ongoing N/V/D, poor intake, GI consulted Suspect worsening of ulcerative colitis as patient ran out of sulfasalazine During his stay he had a near syncopal episode associated with hypotension, Dr. Caryl Comes consulted not ST on monitor at the tie of the . Developed BRBPR anemia that required PRBC (total 7U). Discharged 02/18/20 back on his medicines.  He saw Dr. Stanford Breed 03/10/20, doing better, no ongoing orthostatic symptoms, no changes were made, planned for labs.  Device clinic noted 03/15/20 a HVR/NSVT episode 15 seconds, no reported symptoms and sent to Dr. Caryl Comes.  He made mention noting that he had 9% high HR burden and wondered about PMT and if this may be contributing to his reduction in EF and wanted him brought in to check his device and adjust PVARP.  TODAY All in all, not feeling great, but slowly regaining his  strength after his last hospital stay, with N/V/D, GIB.  States yesterday seems the diarrhea started to finally ease up.  No bleeding.  He c/u with constant RUQ pain that they have not yet been able to figure out.  PT was coming to the house, this is finished. He has not had any cardiac concerns, no CP, palpitations or cardiac awareness. No syncope, mentions overnight last weekend (sat>Sun, he got out of bed to go to the bathroom and his legs "turned to jello" and he fell, he stayed feeling quite weak and took about 27mnutes and his wife's help to get him up. He did not faint (no arrhythmias noted on his device that correlate) He does get lightheaded upon standing if he doesn't take it slow. He denies rest SOB, but states he has been easily winded for quite a long time, this is unchanged.  Device information SJM CRT-P implanted 04/29/2014    Past Medical History:  Diagnosis Date  . AV block, 2nd degree 2015   [employer] Allure Quadra pulse generator [id number] model PM [id number]  . Back pain   . Cardiomyopathy ([facility]    Nonischemic 45%.   . CHF (congestive heart failure) ([facility]   . Gout   . Hemochromatosis   . Hypertension   . Hypospadias [birth year]  born with  . Nephrolithiasis   . Polycythemia vera(238.4)     Past Surgical History:  Procedure Laterality Date  . ANKLE SURGERY    . BI-VENTRICULAR PACEMAKER INSERTION N/A 04/29/2014   Procedure: BI-VENTRICULAR PACEMAKER INSERTION (CRT-P);  Surgeon: SDeboraha Sprang MD; Laterality: Left  [place] pulse generator [id number], model PM [id number]  . COLONOSCOPY N/A 02/15/2020   Procedure: COLONOSCOPY;  Surgeon: Ronnette Juniper, MD;  Location: [facility];  Service: Gastroenterology;  Laterality: N/A;  . ESOPHAGOGASTRODUODENOSCOPY (EGD) WITH PROPOFOL N/A 02/15/2020   Procedure: ESOPHAGOGASTRODUODENOSCOPY (EGD) WITH PROPOFOL;  Surgeon: Ronnette Juniper, MD;  Location: [facility];  Service: Gastroenterology;  Laterality: N/A;  . PILONIDAL CYST  EXCISION    . POSTERIOR LAMINECTOMY / DECOMPRESSION LUMBAR SPINE    . TONSILLECTOMY      Current Outpatient Medications  Medication Sig Dispense Refill  . allopurinol (ZYLOPRIM) 300 MG tablet Take 300 mg by mouth daily.    . BD PEN NEEDLE NANO U/F 32G X 4 MM MISC     . carvedilol (COREG) 12.5 MG tablet Take 1 tablet (12.5 mg total) by mouth 2 (two) times daily with a meal. 180 tablet 3  . citalopram (CELEXA) 10 MG tablet Take 5 mg by mouth daily.     [name] ENTRESTO 24-26 MG TAKE ONE TABLET BY MOUTH TWICE A DAY (Patient taking differently: Take 1 tablet by mouth 2 (two) times daily. ) 60 tablet 4  . febuxostat (ULORIC) 40 MG tablet Take 80 mg by mouth daily.     . folic acid (FOLVITE) 1 MG tablet Take 1 mg by mouth daily.    . furosemide (LASIX) 20 MG tablet TAKE 1 TABLET BY MOUTH  DAILY 90 tablet 3  . furosemide (LASIX) 40 MG tablet Take 1 tablet (40 mg total) by mouth daily. 90 tablet 3  . hydroxyurea (HYDREA) 500 MG capsule TAKE TWO CAPSULES BY MOUTH DAILY (Patient taking differently: Take 500 mg by mouth in the morning and at bedtime. ) 180 capsule 5  . insulin degludec (TRESIBA FLEXTOUCH) 100 UNIT/ML SOPN FlexTouch Pen Inject 20 Units into the skin daily after breakfast.     . methotrexate 2.5 MG tablet Take 12.5 mg by mouth See admin instructions. Take 12.5 mg by mouth in the morning and 12.5 mg in the evening ONLY ON TUESDAYS    . omeprazole (PRILOSEC) 20 MG capsule Take 20 mg by mouth 2 (two) times daily before a meal.     . oxybutynin (DITROPAN-XL) 10 MG 24 hr tablet Take 10 mg by mouth daily.    . predniSONE (DELTASONE) 5 MG tablet Take 5 mg by mouth in the morning and at bedtime.    . rosuvastatin (CRESTOR) 5 MG tablet Take 5 mg by mouth daily.    [name] spironolactone (ALDACTONE) 25 MG tablet Take 0.5 tablets (12.5 mg total) by mouth daily. 45 tablet 3  . sulfaSALAzine (AZULFIDINE) 500 MG EC tablet Take 4 tablets (2,000 mg total) by mouth 2 (two) times daily. 60 tablet 0  . tamsulosin  (FLOMAX) 0.4 MG CAPS capsule Take 0.4 mg by mouth 2 (two) times daily.      No current facility-administered medications for this visit.   Facility-Administered Medications Ordered in Other Visits  Medication Dose Route Frequency Provider Last Rate Last Admin  . 0.9 %  sodium chloride infusion   Intravenous Continuous Volanda Napoleon, MD   Stopped at 05/16/13 1115    Allergies:   Bupropion, Cephalexin, Fluoxetine, Ibuprofen, Prednisone, Temazepam, Trazodone and nefazodone, Fluoxetine hcl, and Prozac [fluoxetine hcl]   Social History:  The patient  reports that he quit smoking about 6 years ago. His smoking use included cigarettes. He started smoking about 51 years ago. He has a 44.00 pack-year smoking history. He has never used  smokeless tobacco. He reports current alcohol use. He reports that he does not use drugs.   Family History:  The patient's family history includes Diabetes in his sister; Heart disease in his maternal grandmother; Hypertension in his sister; Other in his father; Stroke in his mother.  ROS:  Please see the history of present illness.    All other systems are reviewed and otherwise negative.   PHYSICAL EXAM:  VS:  There were no vitals taken for this visit. BMI: There is no height or weight on file to calculate BMI. Well nourished, well developed, in no acute distress HEENT: normocephalic, atraumatic Neck: no JVD, carotid bruits or masses Cardiac:  RRR; no significant murmurs, no rubs, or gallops Lungs:  CTA b/l, no wheezing, rhonchi or rales Abd: soft, nontender MS: no deformity or atrophy Ext: no edema Skin: warm and dry, no rash Neuro:  No gross deficits appreciated Psych: euthymic mood, full affect  PPM site is stable, no tethering or discomfort   EKG:  Not done today  Device interrogation done today and reviewed by myself:  Battery and lead measurements are good. 95% BP PMT detection is on  No PMT detections by the device, time at max track rate is  <1% HVR is NSVT lasting 16 seconds, rate 160 AMS all EGMs are reviewed, look like AT/STm, I did not see any true AFib/flutter With VVI pacing, I did not appreciate retrograde conduction In d/w inductry (and their review of merline) with concerns of possible PMTs, wonders if histograms represent more what sensor indicated rates would be, rather then actual HR's and suggested turning sensor from passive to off to get cleaner HR data at follow up. No other programming changes were made., PVARP remained at 258m   01/14/2020: TTE IMPRESSIONS  1. Left ventricular ejection fraction, by estimation, is 30 to 35%. The  left ventricle has moderately decreased function. The left ventricle  demonstrates regional wall motion abnormalities (see scoring  diagram/findings for description). Left ventricular  diastolic parameters are consistent with Grade II diastolic dysfunction  (pseudonormalization).  2. Right ventricular systolic function is normal. The right ventricular  size is normal.  3. Left atrial size was moderately dilated.  4. The mitral valve is normal in structure. Trivial mitral valve  regurgitation. No evidence of mitral stenosis.  5. Tricuspid valve regurgitation is moderate.  6. The aortic valve is normal in structure. Aortic valve regurgitation is  not visualized. No aortic stenosis is present.  7. The inferior vena cava is normal in size with greater than 50%  respiratory variability, suggesting right atrial pressure of 3 mmHg.   05/15/2018: LVEF 45-50% 2018: LVEF 45-50% 2016: LVEF 50-55% 2015 LVEF 35-40% > 45% 2014: LVEF 45% 2013 LVEF 35-40%  07/27/2015: stress myoview  Nuclear stress EF: 32%.  There was no ST segment deviation noted during stress.  Defect 1: There is a medium defect of moderate severity present in the basal inferior, basal inferolateral, mid inferior and mid inferolateral location.  Findings consistent with prior myocardial infarction.  This is an  intermediate risk study.  The left ventricular ejection fraction is moderately decreased (30-44%).   Mildly dilated LV with moderately decreased LVEF. There is a fixed defect in the basal and mid inferior and inferolateral wall consistent with prior infarct.   Recent Labs: 01/02/2020: NT-Pro BNP 2,894 02/12/2020: B Natriuretic Peptide 65.0 02/13/2020: ALT 11 02/14/2020: Magnesium 1.9 02/18/2020: Hemoglobin 9.8; Platelets 424 03/10/2020: BUN 14; Creatinine, Ser 0.99; Potassium 4.0; Sodium 144  No  results found for requested labs within last 8760 hours.   Estimated Creatinine Clearance: 95.9 mL/min (by C-G formula based on SCr of 0.99 mg/dL).   Wt Readings from Last 3 Encounters:  03/10/20 281 lb 12.8 oz (127.8 kg)  02/15/20 291 lb 10.7 oz (132.3 kg)  02/09/20 282 lb 7.2 oz (128.1 kg)     Other studies reviewed: Additional studies/records reviewed today include: summarized above  ASSESSMENT AND PLAN:  1. CRT-P     Intact function, programming changes as above  2. NICM 3. Chronic CHF (systolic)     He had improvement in his EF over a couple years, this year EF back down to 30-35% (where he started)     on good tx  4. Recent significant GI illness, GIB and anemia requiring transfusion. 5. Orthostatic symptoms     His EF is back down     Having some ST/AT and would like to increase his BB     His EF is down again and would like to keep his meds on board if we can.     He was encouraged to hydrate adequately, he says his diarrhea is improved and hopefully will continue to improve his volume status and orthostatic symptoms   Discussed with the patient that I would review his tracings, medicines with Dr Caryl Comes and get his recommendations for any changes or not and follow up.   I have reviewed pacer interrogation with Dr. Caryl Comes.  Faster rates are upper rate behavior with whencheback with A rates 150's and max tracking at 130bpm. agreed with not extending PVARP and turning passive  sensor to off.  Discussed his medicines and orthostatic symptoms Since he seems to be though slowly on the med, no changes at this time  He is scheduled for January with [facility] for EKG optimization. We reviewed his EKGs from of late to years ago. Recently he has only  intermittent Nag lead I and + V1 that perhaps is rate related and suggest that shortening his sensed Av delay further may be helpful, given his sensed AV delay if set at 180m, on EKG translates to PR of nearly 2072mwith worse looking morphology and QRS duration.    Disposition: mag level today, F/u will be Jan with [employer] scheduled. Dr. CrStanford Breeds planned by him.   Current medicines are reviewed at length with the patient today.  The patient did not have any concerns regarding medicines.  SiVenetia NightPA-C 03/24/2020 5:59 AM     [facility] [address] [phone])  [phone])

## 2020-03-25 ENCOUNTER — Encounter: Payer: Self-pay | Admitting: Physician Assistant

## 2020-03-25 ENCOUNTER — Other Ambulatory Visit: Payer: Self-pay

## 2020-03-25 ENCOUNTER — Ambulatory Visit: Payer: Medicare Other | Admitting: Physician Assistant

## 2020-03-25 VITALS — BP 110/72 | HR 85 | Ht 70.0 in | Wt 280.4 lb

## 2020-03-25 DIAGNOSIS — R42 Dizziness and giddiness: Secondary | ICD-10-CM

## 2020-03-25 DIAGNOSIS — Z950 Presence of cardiac pacemaker: Secondary | ICD-10-CM

## 2020-03-25 DIAGNOSIS — Z79899 Other long term (current) drug therapy: Secondary | ICD-10-CM | POA: Diagnosis not present

## 2020-03-25 DIAGNOSIS — I5022 Chronic systolic (congestive) heart failure: Secondary | ICD-10-CM

## 2020-03-25 DIAGNOSIS — I441 Atrioventricular block, second degree: Secondary | ICD-10-CM | POA: Diagnosis not present

## 2020-03-25 DIAGNOSIS — I428 Other cardiomyopathies: Secondary | ICD-10-CM | POA: Diagnosis not present

## 2020-03-25 LAB — MAGNESIUM: Magnesium: 1.7 mg/dL (ref 1.6–2.3)

## 2020-03-25 NOTE — Patient Instructions (Addendum)
   Medication Instructions:   Your physician recommends that you continue on your current medications as directed. Please refer to the Current Medication list given to you today.   *If you need a refill on your cardiac medications before your next appointment, please call your pharmacy*   Lab Work: [place]   If you have labs (blood work) drawn today and your tests are completely normal, you will receive your results only by: [name] MyChart Message (if you have MyChart) OR . A paper copy in the mail If you have any lab test that is abnormal or we need to change your treatment, we will call you to review the results.   Testing/Procedures: NONE ORDERED  TODAY    Follow-Up: At [facility], you and your health needs are our priority.  As part of our continuing mission to provide you with exceptional heart care, we have created designated Provider Care Teams.  These Care Teams include your primary Cardiologist (physician) and Advanced Practice Providers (APPs -  Physician Assistants and Nurse Practitioners) who all work together to provide you with the care you need, when you need it.  We recommend signing up for the patient portal called "MyChart".  Sign up information is provided on this After Visit Summary.  MyChart is used to connect with patients for Virtual Visits (Telemedicine).  Patients are able to view lab/test results, encounter notes, upcoming appointments, etc.  Non-urgent messages can be sent to your provider as well.   To learn more about what you can do with MyChart, go to [email or web address].    Your next appointment:  PENDING AFTER DISCUSSION WITH DR Caryl Comes

## 2020-03-26 ENCOUNTER — Inpatient Hospital Stay: Payer: Medicare Other | Attending: Hematology & Oncology | Admitting: Hematology & Oncology

## 2020-03-26 ENCOUNTER — Telehealth: Payer: Self-pay

## 2020-03-26 ENCOUNTER — Inpatient Hospital Stay: Payer: Medicare Other

## 2020-03-26 ENCOUNTER — Other Ambulatory Visit: Payer: Self-pay | Admitting: *Deleted

## 2020-03-26 ENCOUNTER — Encounter: Payer: Self-pay | Admitting: Hematology & Oncology

## 2020-03-26 VITALS — BP 111/54 | HR 83 | Temp 97.7°F | Resp 21 | Wt 279.0 lb

## 2020-03-26 DIAGNOSIS — R197 Diarrhea, unspecified: Secondary | ICD-10-CM | POA: Insufficient documentation

## 2020-03-26 DIAGNOSIS — D45 Polycythemia vera: Secondary | ICD-10-CM

## 2020-03-26 DIAGNOSIS — I441 Atrioventricular block, second degree: Secondary | ICD-10-CM | POA: Insufficient documentation

## 2020-03-26 DIAGNOSIS — Z79899 Other long term (current) drug therapy: Secondary | ICD-10-CM

## 2020-03-26 DIAGNOSIS — Z7982 Long term (current) use of aspirin: Secondary | ICD-10-CM | POA: Diagnosis not present

## 2020-03-26 LAB — CMP (CANCER CENTER ONLY)
ALT: 12 U/L (ref 0–44)
AST: 12 U/L — ABNORMAL LOW (ref 15–41)
Albumin: 4.2 g/dL (ref 3.5–5.0)
Alkaline Phosphatase: 49 U/L (ref 38–126)
Anion gap: 9 (ref 5–15)
BUN: 23 mg/dL (ref 8–23)
CO2: 23 mmol/L (ref 22–32)
Calcium: 9.5 mg/dL (ref 8.9–10.3)
Chloride: 108 mmol/L (ref 98–111)
Creatinine: 0.97 mg/dL (ref 0.61–1.24)
GFR, Estimated: >60 mL/min (ref >60)
Glucose, Bld: 118 mg/dL — ABNORMAL HIGH (ref 70–99)
Potassium: 4.1 mmol/L (ref 3.5–5.1)
Sodium: 140 mmol/L (ref 135–145)
Total Bilirubin: 0.8 mg/dL (ref 0.3–1.2)
Total Protein: 6.8 g/dL (ref 6.5–8.1)

## 2020-03-26 LAB — CBC WITH DIFFERENTIAL (CANCER CENTER ONLY)
Abs Immature Granulocytes: 0.07 10*3/uL (ref 0.00–0.07)
Basophils Absolute: 0.1 10*3/uL (ref 0.0–0.1)
Basophils Relative: 2 %
Eosinophils Absolute: 0.1 10*3/uL (ref 0.0–0.5)
Eosinophils Relative: 1 %
HCT: 40.3 % (ref 39.0–52.0)
Hemoglobin: 13.1 g/dL (ref 13.0–17.0)
Immature Granulocytes: 1 %
Lymphocytes Relative: 13 %
Lymphs Abs: 0.8 10*3/uL (ref 0.7–4.0)
MCH: 33.9 pg (ref 26.0–34.0)
MCHC: 32.5 g/dL (ref 30.0–36.0)
MCV: 104.4 fL — ABNORMAL HIGH (ref 80.0–100.0)
Monocytes Absolute: 0.1 10*3/uL (ref 0.1–1.0)
Monocytes Relative: 2 %
Neutro Abs: 5.1 10*3/uL (ref 1.7–7.7)
Neutrophils Relative %: 81 %
Platelet Count: 378 10*3/uL (ref 150–400)
RBC: 3.86 MIL/uL — ABNORMAL LOW (ref 4.22–5.81)
WBC Count: 6.3 10*3/uL (ref 4.0–10.5)
nRBC: 0 % (ref 0.0–0.2)

## 2020-03-26 LAB — LACTATE DEHYDROGENASE: LDH: 297 U/L — ABNORMAL HIGH (ref 98–192)

## 2020-03-26 MED ORDER — MAGNESIUM OXIDE 400 MG PO CAPS: 400.0000 mg | ORAL_CAPSULE | Freq: Every day | ORAL | 1 refills | Status: DC

## 2020-03-26 MED ORDER — MAGNESIUM OXIDE 400 (241.3 MG) MG PO TABS: 400.0000 mg | ORAL_TABLET | Freq: Every day | ORAL | 1 refills | Status: DC

## 2020-03-26 NOTE — Telephone Encounter (Signed)
appts made and printed for pt per 03/26/20 los.... AOM

## 2020-03-26 NOTE — Progress Notes (Signed)
Hematology and Oncology Follow Up Visit  William Villanueva [id number] 1951-02-01 69 y.o. 03/26/2020   Principle Diagnosis:   Polycythemia vera-JAK2 positive  Heart block-Mobitz II  Current Therapy:    Hydrea 1000 mg p.o.daily -dose changed on 06/25/2018  Aspirin 81 mg by mouth daily  Phlebotomy to maintain hematocrit below 45%     Interim History:  Mr.  William Villanueva is back for followup.  He actually was hospitalized back in October.  He had diarrhea.  I think he had some bleeding.  He may have had an ulcer.  The diarrhea is still present but not as bad.  Thankfully, he has not had issues with cardiac problems.  Does have the pacemaker in place.  He did lose quite a bit of weight because of being in the hospital.  This certainly is a good thing for him.  He has had no fever.  There is been no rashes.  He has had no cough.  I know that he is being followed by several doctors.  I know he has the heart issues.  Currently, I was his performance status is ECOG 2.    Medications:  Current Outpatient Medications:  .  allopurinol (ZYLOPRIM) 300 MG tablet, Take 300 mg by mouth daily., Disp: , Rfl:  .  BD PEN NEEDLE NANO U/F 32G X 4 MM MISC, , Disp: , Rfl:  .  carvedilol (COREG) 12.5 MG tablet, Take 1 tablet (12.5 mg total) by mouth 2 (two) times daily with a meal., Disp: 180 tablet, Rfl: 3 .  citalopram (CELEXA) 10 MG tablet, Take 5 mg by mouth daily. , Disp: , Rfl:  .  dicyclomine (BENTYL) 20 MG tablet, Take 20 mg by mouth 4 (four) times daily., Disp: , Rfl:  .  ENTRESTO 24-26 MG, TAKE ONE TABLET BY MOUTH TWICE A DAY (Patient taking differently: Take 1 tablet by mouth 2 (two) times daily. ), Disp: 60 tablet, Rfl: 4 .  febuxostat (ULORIC) 40 MG tablet, Take 80 mg by mouth daily. , Disp: , Rfl:  .  folic acid (FOLVITE) 1 MG tablet, Take 1 mg by mouth daily., Disp: , Rfl:  .  furosemide (LASIX) 40 MG tablet, Take 1 tablet (40 mg total) by mouth daily. (Patient taking differently: Take 40 mg  by mouth every other day. ), Disp: 90 tablet, Rfl: 3 .  hydroxyurea (HYDREA) 500 MG capsule, TAKE TWO CAPSULES BY MOUTH DAILY (Patient taking differently: Take 500 mg by mouth in the morning and at bedtime. ), Disp: 180 capsule, Rfl: 5 .  insulin degludec (TRESIBA FLEXTOUCH) 100 UNIT/ML SOPN FlexTouch Pen, Inject 20 Units into the skin daily after breakfast. , Disp: , Rfl:  .  methotrexate 2.5 MG tablet, Take 12.5 mg by mouth See admin instructions. Take 12.5 mg by mouth in the morning and 12.5 mg in the evening ONLY ON TUESDAYS, Disp: , Rfl:  .  omeprazole (PRILOSEC) 20 MG capsule, Take 20 mg by mouth 2 (two) times daily before a meal. , Disp: , Rfl:  .  oxybutynin (DITROPAN-XL) 10 MG 24 hr tablet, Take 10 mg by mouth daily., Disp: , Rfl:  .  predniSONE (DELTASONE) 5 MG tablet, Take 5 mg by mouth in the morning and at bedtime., Disp: , Rfl:  .  rosuvastatin (CRESTOR) 5 MG tablet, Take 5 mg by mouth daily., Disp: , Rfl:  .  spironolactone (ALDACTONE) 25 MG tablet, Take 0.5 tablets (12.5 mg total) by mouth daily., Disp: 45 tablet, Rfl: 3 .  tamsulosin (FLOMAX) 0.4 MG CAPS capsule, Take 0.4 mg by mouth 2 (two) times daily. , Disp: , Rfl:  No current facility-administered medications for this visit.  Facility-Administered Medications Ordered in Other Visits:  .  0.9 %  sodium chloride infusion, , Intravenous, Continuous, Maleya Leever, Rudell Cobb, MD, Stopped at 05/16/13 1115  Allergies:  Allergies  Allergen Reactions  . Bupropion Hives  . Cephalexin Diarrhea and Other (See Comments)    Caused C-diff, also   . Fluoxetine Itching  . Ibuprofen Itching  . Prednisone Itching and Other (See Comments)    Abdominal pain, also   . Temazepam Other (See Comments)    Dizziness   . Trazodone And Nefazodone Other (See Comments)    Dizziness  . Fluoxetine Hcl Itching  . Prozac [Fluoxetine Hcl] Itching    Past Medical History, Surgical history, Social history, and Family History were reviewed and  updated.  Review of Systems: Review of Systems  Constitutional: Negative.   HENT: Negative.   Eyes: Negative.   Respiratory: Positive for shortness of breath.   Cardiovascular: Positive for chest pain.  Gastrointestinal: Negative.   Genitourinary: Negative.   Musculoskeletal: Positive for back pain.  Skin: Negative.   Neurological: Negative.   Endo/Heme/Allergies: Negative.   Psychiatric/Behavioral: Negative.     Physical Exam:  weight is 279 lb (126.6 kg). His oral temperature is 97.7 F (36.5 C). His blood pressure is 111/54 (abnormal) and his pulse is 83. His respiration is 21 (abnormal) and oxygen saturation is 97%.   Physical Exam Vitals reviewed.  HENT:     Head: Normocephalic and atraumatic.  Eyes:     Pupils: Pupils are equal, round, and reactive to light.  Cardiovascular:     Rate and Rhythm: Normal rate and regular rhythm.     Heart sounds: Normal heart sounds.  Pulmonary:     Effort: Pulmonary effort is normal.     Breath sounds: Normal breath sounds.  Abdominal:     General: Bowel sounds are normal.     Palpations: Abdomen is soft.  Musculoskeletal:        General: No tenderness or deformity. Normal range of motion.     Cervical back: Normal range of motion.  Lymphadenopathy:     Cervical: No cervical adenopathy.  Skin:    General: Skin is warm and dry.     Findings: No erythema or rash.  Neurological:     Mental Status: He is alert and oriented to person, place, and time.  Psychiatric:        Behavior: Behavior normal.        Thought Content: Thought content normal.        Judgment: Judgment normal.      Lab Results  Component Value Date   WBC 6.3 03/26/2020   HGB 13.1 03/26/2020   HCT 40.3 03/26/2020   MCV 104.4 (H) 03/26/2020   PLT 378 03/26/2020     Chemistry      Component Value Date/Time   NA 140 03/26/2020 0852   NA 144 03/10/2020 1131   NA 141 04/10/2017 0923   NA 140 04/05/2016 0739   K 4.1 03/26/2020 0852   K 3.5 04/10/2017  0923   K 4.2 04/05/2016 0739   CL 108 03/26/2020 0852   CL 108 04/10/2017 0923   CO2 23 03/26/2020 0852   CO2 24 04/10/2017 0923   CO2 20 (L) 04/05/2016 0739   BUN 23 03/26/2020 0852   BUN 14 03/10/2020 1131  BUN 12 04/10/2017 0923   BUN 14.7 04/05/2016 0739   CREATININE 0.97 03/26/2020 0852   CREATININE 1.1 04/10/2017 0923   CREATININE 1.1 04/05/2016 0739      Component Value Date/Time   CALCIUM 9.5 03/26/2020 0852   CALCIUM 8.4 04/10/2017 0923   CALCIUM 9.0 04/05/2016 0739   ALKPHOS 49 03/26/2020 0852   ALKPHOS 77 04/10/2017 0923   ALKPHOS 91 04/05/2016 0739   AST 12 (L) 03/26/2020 0852   AST 34 04/05/2016 0739   ALT 12 03/26/2020 0852   ALT 32 04/10/2017 0923   ALT 40 04/05/2016 0739   BILITOT 0.8 03/26/2020 0852   BILITOT 0.48 04/05/2016 0739      Impression and Plan: Mr. Ederer is 69 year old gentleman with polycythemia vera.  He is on Hydrea.  He is doing well with the Hydrea.  I just feel bad that he was hospitalized.  I know it was tough for him in the hospital.  We will not phlebotomize him today.  I do think there is too much going on with him.  I do not want to cause any further problems as he can have some issues with phlebotomy.  I will get him back in January.  I think we can get him through the holiday season.    I will certainly pray for his wife who is having rotator cuff surgery for her right shoulder on Monday.  Volanda Napoleon, MD 11/19/20219:45 AM

## 2020-04-05 ENCOUNTER — Ambulatory Visit (INDEPENDENT_AMBULATORY_CARE_PROVIDER_SITE_OTHER): Payer: Medicare Other

## 2020-04-05 DIAGNOSIS — I5022 Chronic systolic (congestive) heart failure: Secondary | ICD-10-CM

## 2020-04-05 DIAGNOSIS — Z950 Presence of cardiac pacemaker: Secondary | ICD-10-CM

## 2020-04-06 ENCOUNTER — Other Ambulatory Visit: Payer: Medicare Other | Admitting: *Deleted

## 2020-04-06 ENCOUNTER — Other Ambulatory Visit: Payer: Self-pay

## 2020-04-06 DIAGNOSIS — Z79899 Other long term (current) drug therapy: Secondary | ICD-10-CM

## 2020-04-06 NOTE — Progress Notes (Signed)
EPIC Encounter for ICM Monitoring  Patient Name: William Villanueva is a 69 y.o. male Date: 04/06/2020 Primary Care Physican: Antony Contras, MD Primary Cardiologist:Crenshaw Electrophysiologist:Klein Bi-V Pacing:98% 04/06/2020 Weight: 275 lbs   Spoke with patient and he is feeling much better than he was a month ago.  CorVue thoracic impedancesuggesting normal fluid levels.  Prescribed:   Furosemide40 mg take 1 tablet daily.  Patient reports on 04/06/20 he is taking Furosemide every other day.   Spironolactone 25 mg take 0.5 tablet (12.5 mg total) once a day  Labs: 03/26/2020 Creatinine 0.97, BUN 23, Potassium 4.1, Sodium 140, GFR >60 03/10/2020 Creatinine 0.99, BUN 14, Potassium 4.0, Sodium 144, GFR 78-90 02/18/2020 Creatinine1.10, BUN11, Potassium3.3, Sodium140, GFR>60 02/17/2020 Creatinine1.11, BUN16, Potassium3.5, Sodium142, GFR>60  02/16/2020 Creatinine1.21, BUN31, Potassium3.5, Sodium141, GFR>60  02/15/2020 Creatinine1.58, BUN46, Potassium3.7, Sodium140, GFR44  02/14/2020 Creatinine1.60, BUN49, Potassium4.3, YFRTMY111, GFR44  02/13/2020 Creatinine2.09, BUN60, Potassium3.6, Sodium133  02/12/2020 Creatinine2.12, BUN61, Potassium3.1, Sodium133 A complete set of results can be found in Results Review.  Recommendations:No changes and encouraged to call if experiencing any fluid symptoms.  Follow-up plan: ICM clinic phone appointment on1/07/2020. 91 day device clinic remote transmission 06/17/2020.   EP/Cardiology Office Visits:05/29/2019 with Chanetta Marshall, NP.  Copy of ICM check sent to [place].   3 month ICM trend: 04/05/2020    1 Year ICM trend:       Rosalene Billings, RN 04/06/2020 3:16 PM

## 2020-04-07 ENCOUNTER — Other Ambulatory Visit: Payer: Self-pay | Admitting: *Deleted

## 2020-04-07 DIAGNOSIS — Z79899 Other long term (current) drug therapy: Secondary | ICD-10-CM

## 2020-04-07 LAB — MAGNESIUM: Magnesium: 1.4 mg/dL — ABNORMAL LOW (ref 1.6–2.3)

## 2020-04-07 MED ORDER — MAGNESIUM OXIDE 400 MG PO CAPS
400.0000 mg | ORAL_CAPSULE | Freq: Two times a day (BID) | ORAL | 1 refills | Status: DC
Start: 2020-04-07 — End: 2020-04-26

## 2020-04-16 ENCOUNTER — Other Ambulatory Visit (HOSPITAL_BASED_OUTPATIENT_CLINIC_OR_DEPARTMENT_OTHER): Payer: Self-pay | Admitting: Internal Medicine

## 2020-04-16 ENCOUNTER — Ambulatory Visit: Payer: Medicare Other | Attending: Internal Medicine

## 2020-04-16 DIAGNOSIS — Z23 Encounter for immunization: Secondary | ICD-10-CM

## 2020-04-16 NOTE — Progress Notes (Signed)
   Covid-19 Vaccination Clinic  Name:  William Villanueva    MRN: [id number] DOB: August 05, 1950  04/16/2020  Mr. Karnik was observed post Covid-19 immunization for 15 minutes without incident. He was provided with Vaccine Information Sheet and instruction to access the V-Safe system.   Mr. Bishop was instructed to call 911 with any severe reactions post vaccine: [name] Difficulty breathing  . Swelling of face and throat  . A fast heartbeat  . A bad rash all over body  . Dizziness and weakness   Immunizations Administered    Name Date Dose VIS Date Route   Pfizer COVID-19 Vaccine 04/16/2020 10:54 AM 0.3 mL 02/25/2020 Intramuscular   Manufacturer: [place]   Lot: [id number]   [place]: [address]-7

## 2020-04-19 ENCOUNTER — Telehealth: Payer: Self-pay | Admitting: Emergency Medicine

## 2020-04-19 ENCOUNTER — Encounter: Payer: Self-pay | Admitting: *Deleted

## 2020-04-19 DIAGNOSIS — I5042 Chronic combined systolic (congestive) and diastolic (congestive) heart failure: Secondary | ICD-10-CM

## 2020-04-19 DIAGNOSIS — Z79899 Other long term (current) drug therapy: Secondary | ICD-10-CM

## 2020-04-19 NOTE — Telephone Encounter (Signed)
Alert received for episode of NSVT  33 beats duration on 04/17/20 at 0947. Patient reports he was asymptomatic and was unaware of episode. Patient has no missed doses of Coreg 12.5 mg BID. Will call if he has any change in condition or has any CP, SOB, chest tightness, palpitations.

## 2020-04-20 MED FILL — PFIZER-BIONTECH COVID-19 VA: 30 | 1 days supply | Qty: 0 | Fill #0

## 2020-04-21 ENCOUNTER — Other Ambulatory Visit: Payer: Medicare Other | Admitting: *Deleted

## 2020-04-21 ENCOUNTER — Other Ambulatory Visit: Payer: Self-pay

## 2020-04-21 DIAGNOSIS — Z79899 Other long term (current) drug therapy: Secondary | ICD-10-CM

## 2020-04-21 LAB — MAGNESIUM: Magnesium: 1.4 mg/dL — ABNORMAL LOW (ref 1.6–2.3)

## 2020-04-26 ENCOUNTER — Telehealth: Payer: Self-pay | Admitting: Cardiology

## 2020-04-26 MED ORDER — MAGNESIUM OXIDE 400 MG PO CAPS: 800.0000 mg | ORAL_CAPSULE | Freq: Two times a day (BID) | ORAL | 1 refills | Status: DC

## 2020-04-26 NOTE — Telephone Encounter (Signed)
Spoke with patient who states that he has Entresto patient assistance forms that need to be dropped off and would like to know when was a good time. Advised patient that he can drop the forms off at any time to office. Patient stated that he would be in the area this afternoon and will drop off his portion of the forms at that time. Advised patient I would forward this message to Hilda Blades, [place]. Patient verbalized understanding.

## 2020-04-26 NOTE — Telephone Encounter (Signed)
Attempted phone call to pt.  Left voicemail message to contact RN at [phone].

## 2020-04-26 NOTE — Telephone Encounter (Signed)
Patient is following up regarding paperwork for assistance with purchasing [name] (see patient message from 04/19/20). He states he has the paperwork with him and he is able to drop it off at the office. He would like to know when a good time to drop it off is. Please call.

## 2020-04-26 NOTE — Telephone Encounter (Signed)
Spoke with pt who states he has did start Mg as prescribed.  Advised per Dr Caryl Comes pt should double current dossage of Mg by taking 2 caps bid.  Pt verbalizes understanding and agrees with current plan.  Appointment scheduled for 05/10/2020 at 11am for repeat BMET and Mg. Requested pt contact office if he has difficulty taking increased dose of Mg. Pt verbalizes understanding and agrees with current plan

## 2020-04-26 NOTE — Telephone Encounter (Signed)
Ladies   Can we check and see if he started his Mg Oxide prescription ( supposed to have 12/1)  If not >> start  And if he did, to double it   Mg/BMET in 2 weeks  Thanks SK

## 2020-04-26 NOTE — Telephone Encounter (Signed)
Patient is returning call.  °

## 2020-04-28 ENCOUNTER — Encounter: Payer: Self-pay | Admitting: *Deleted

## 2020-04-28 NOTE — Telephone Encounter (Signed)
entresto application faxed to the company. Patient made aware.

## 2020-05-10 ENCOUNTER — Ambulatory Visit (INDEPENDENT_AMBULATORY_CARE_PROVIDER_SITE_OTHER): Payer: Medicare Other

## 2020-05-10 ENCOUNTER — Other Ambulatory Visit: Payer: Medicare Other

## 2020-05-10 ENCOUNTER — Other Ambulatory Visit: Payer: Self-pay

## 2020-05-10 DIAGNOSIS — I5042 Chronic combined systolic (congestive) and diastolic (congestive) heart failure: Secondary | ICD-10-CM | POA: Diagnosis not present

## 2020-05-10 DIAGNOSIS — I5022 Chronic systolic (congestive) heart failure: Secondary | ICD-10-CM | POA: Diagnosis not present

## 2020-05-10 DIAGNOSIS — Z79899 Other long term (current) drug therapy: Secondary | ICD-10-CM | POA: Diagnosis not present

## 2020-05-10 DIAGNOSIS — Z950 Presence of cardiac pacemaker: Secondary | ICD-10-CM | POA: Diagnosis not present

## 2020-05-10 LAB — BASIC METABOLIC PANEL
BUN/Creatinine Ratio: 13 (ref 10–24)
BUN: 14 mg/dL (ref 8–27)
CO2: 19 mmol/L — ABNORMAL LOW (ref 20–29)
Calcium: 8.4 mg/dL — ABNORMAL LOW (ref 8.6–10.2)
Chloride: 106 mmol/L (ref 96–106)
Creatinine, Ser: 1.06 mg/dL (ref 0.76–1.27)
GFR calc Af Amer: 83 mL/min/{1.73_m2} (ref >59)
GFR calc non Af Amer: 72 mL/min/{1.73_m2} (ref >59)
Glucose: 174 mg/dL — ABNORMAL HIGH (ref 65–99)
Potassium: 4.2 mmol/L (ref 3.5–5.2)
Sodium: 140 mmol/L (ref 134–144)

## 2020-05-10 LAB — MAGNESIUM: Magnesium: 1.4 mg/dL — ABNORMAL LOW (ref 1.6–2.3)

## 2020-05-11 NOTE — Progress Notes (Signed)
EPIC Encounter for ICM Monitoring  Patient Name: William Villanueva is a 70 y.o. male Date: 05/11/2020 Primary Care Physican: Tally Joe, MD Primary Cardiologist:Crenshaw Electrophysiologist:Klein Bi-V Pacing:97% 04/06/2020 Weight: 275 lbs   Spoke with patientand he is doing well  CorVue thoracic impedancesuggestingnormal fluid levels.  Prescribed:   Furosemide40 mg take 1 tablet daily.  Patient reports on 04/06/20 he is taking Furosemide every other day.   Spironolactone 25 mg take 0.5 tablet (12.5 mg total) once a day  Labs: 05/10/2020 Creatinine 1.06, BUN 14, Potassium 4.2, Sodium 140, GFR 72-83 03/26/2020 Creatinine 0.97, BUN 23, Potassium 4.1, Sodium 140, GFR >60 03/10/2020 Creatinine 0.99, BUN 14, Potassium 4.0, Sodium 144, GFR 78-90 02/18/2020 Creatinine1.10, BUN11, Potassium3.3, Sodium140, GFR>60 02/17/2020 Creatinine1.11, BUN16, Potassium3.5, Sodium142, GFR>60  02/16/2020 Creatinine1.21, BUN31, Potassium3.5, Sodium141, GFR>60  02/15/2020 Creatinine1.58, BUN46, Potassium3.7, Sodium140, GFR44  02/14/2020 Creatinine1.60, BUN49, Potassium4.3, Sodium137, GFR44  02/13/2020 Creatinine2.09, BUN60, Potassium3.6, Sodium133  02/12/2020 Creatinine2.12, BUN61, Potassium3.1, Sodium133 A complete set of results can be found in Results Review.  Recommendations:No changes and encouraged to call if experiencing any fluid symptoms.  Follow-up plan: ICM clinic phone appointment on2/11/2020. 91 day device clinic remote transmission 06/17/2020.   EP/Cardiology Office Visits:05/29/2019 with Gypsy Balsam, NP.  Copy of ICM check sent to Dr.Klein.  3 month ICM trend: 05/10/2020.    1 Year ICM trend:       Karie Soda, RN 05/11/2020 3:52 PM

## 2020-05-12 ENCOUNTER — Telehealth: Payer: Self-pay

## 2020-05-12 NOTE — Telephone Encounter (Signed)
-----   Message from William Salvia, MD sent at 05/10/2020  3:41 PM EST ----- Please Inform Patient  Labs are normal x #Mg is low  Is the current dose of Mg Oxide 400 x 2 /day a new or old dose    Thanks

## 2020-05-12 NOTE — Telephone Encounter (Signed)
Spoke with pt and advised per Dr Caryl Comes labs are normal except Magnesium is low.  Pt confirms he is taking Mag Oxide 420m - 2 tablets by mouth twice daily.  Pt advised will notify Dr KCaryl Comesof current dose.  Pt verbalizes understanding and thanked RTherapist, sportsfor call.

## 2020-05-13 NOTE — Telephone Encounter (Signed)
William Villanueva.  This mag issue is beyond my ken.  Could you ask him if he has ever seen nephrology for this or had it addressed by his PCP.  If yes to either then we should ask him to follow with them.  If no then let us set him up to see nephrology  Thx

## 2020-05-14 ENCOUNTER — Encounter: Payer: Self-pay | Admitting: Hematology & Oncology

## 2020-05-14 ENCOUNTER — Other Ambulatory Visit: Payer: Self-pay

## 2020-05-14 ENCOUNTER — Inpatient Hospital Stay: Payer: Medicare Other | Admitting: Hematology & Oncology

## 2020-05-14 ENCOUNTER — Inpatient Hospital Stay: Payer: Medicare Other | Attending: Hematology & Oncology

## 2020-05-14 VITALS — BP 115/64 | HR 79 | Temp 97.6°F | Resp 20 | Wt 288.0 lb

## 2020-05-14 DIAGNOSIS — D45 Polycythemia vera: Secondary | ICD-10-CM

## 2020-05-14 DIAGNOSIS — Z7982 Long term (current) use of aspirin: Secondary | ICD-10-CM | POA: Insufficient documentation

## 2020-05-14 DIAGNOSIS — I441 Atrioventricular block, second degree: Secondary | ICD-10-CM | POA: Diagnosis not present

## 2020-05-14 DIAGNOSIS — Z950 Presence of cardiac pacemaker: Secondary | ICD-10-CM | POA: Diagnosis not present

## 2020-05-14 LAB — CBC WITH DIFFERENTIAL (CANCER CENTER ONLY)
Abs Immature Granulocytes: 0.08 10*3/uL — ABNORMAL HIGH (ref 0.00–0.07)
Basophils Absolute: 0.2 10*3/uL — ABNORMAL HIGH (ref 0.0–0.1)
Basophils Relative: 2 %
Eosinophils Absolute: 0.1 10*3/uL (ref 0.0–0.5)
Eosinophils Relative: 2 %
HCT: 38.1 % — ABNORMAL LOW (ref 39.0–52.0)
Hemoglobin: 12.4 g/dL — ABNORMAL LOW (ref 13.0–17.0)
Immature Granulocytes: 1 %
Lymphocytes Relative: 14 %
Lymphs Abs: 0.9 10*3/uL (ref 0.7–4.0)
MCH: 36 pg — ABNORMAL HIGH (ref 26.0–34.0)
MCHC: 32.5 g/dL (ref 30.0–36.0)
MCV: 110.8 fL — ABNORMAL HIGH (ref 80.0–100.0)
Monocytes Absolute: 0.3 10*3/uL (ref 0.1–1.0)
Monocytes Relative: 4 %
Neutro Abs: 4.9 10*3/uL (ref 1.7–7.7)
Neutrophils Relative %: 77 %
Platelet Count: 425 10*3/uL — ABNORMAL HIGH (ref 150–400)
RBC: 3.44 MIL/uL — ABNORMAL LOW (ref 4.22–5.81)
RDW: 18.3 % — ABNORMAL HIGH (ref 11.5–15.5)
WBC Count: 6.5 10*3/uL (ref 4.0–10.5)
nRBC: 0.5 % — ABNORMAL HIGH (ref 0.0–0.2)

## 2020-05-14 LAB — IRON AND TIBC
Iron: 125 ug/dL (ref 42–163)
Saturation Ratios: 33 % (ref 20–55)
TIBC: 381 ug/dL (ref 202–409)
UIBC: 257 ug/dL (ref 117–376)

## 2020-05-14 LAB — CMP (CANCER CENTER ONLY)
ALT: 24 U/L (ref 0–44)
AST: 22 U/L (ref 15–41)
Albumin: 4 g/dL (ref 3.5–5.0)
Alkaline Phosphatase: 52 U/L (ref 38–126)
Anion gap: 9 (ref 5–15)
BUN: 18 mg/dL (ref 8–23)
CO2: 27 mmol/L (ref 22–32)
Calcium: 9.1 mg/dL (ref 8.9–10.3)
Chloride: 105 mmol/L (ref 98–111)
Creatinine: 1.02 mg/dL (ref 0.61–1.24)
GFR, Estimated: >60 mL/min (ref >60)
Glucose, Bld: 154 mg/dL — ABNORMAL HIGH (ref 70–99)
Potassium: 4.2 mmol/L (ref 3.5–5.1)
Sodium: 141 mmol/L (ref 135–145)
Total Bilirubin: 0.6 mg/dL (ref 0.3–1.2)
Total Protein: 6.4 g/dL — ABNORMAL LOW (ref 6.5–8.1)

## 2020-05-14 LAB — LACTATE DEHYDROGENASE: LDH: 305 U/L — ABNORMAL HIGH (ref 98–192)

## 2020-05-14 LAB — FERRITIN: Ferritin: 32 ng/mL (ref 24–336)

## 2020-05-14 NOTE — Telephone Encounter (Signed)
Spoke with pt who and advised of Dr Olin Pia recommendation for referral d/y low Mg level.  Pt reports he has seen Nephrology previously, Dr Candiss Norse.  Referral placed for referral to Dr Candiss Norse for hypomagnesia.  Pt verbalizes understanding and agrees with current plan.

## 2020-05-14 NOTE — Progress Notes (Signed)
Hematology and Oncology Follow Up Visit  William Villanueva [id number] [birth year] 70 y.o. 05/14/2020   Principle Diagnosis:   Polycythemia vera-JAK2 positive  Heart block-Mobitz II  Current Therapy:    Hydrea 1000 mg p.o.daily -dose changed on 06/25/2018  Aspirin 81 mg by mouth daily  Phlebotomy to maintain hematocrit below 45%     Interim History:  Mr.  William Villanueva is back for followup.  Thankfully, his diarrhea is better.  He was hospitalized for the diarrhea back in October.  This really was causing quite a few problems for him.  This seems to be much better now.  He had a good holiday season.  He was home.  His heart is doing okay.  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  He has had no issues with bleeding.  His back does bother him.  He is going go for epidural injections today.  He has had no problems with rashes.  He has had no leg swelling.  Overall, I would say his performance status is ECOG 1.    Medications:  Current Outpatient Medications:  .  allopurinol (ZYLOPRIM) 300 MG tablet, Take 300 mg by mouth daily., Disp: , Rfl:  .  BD PEN NEEDLE NANO U/F 32G X 4 MM MISC, , Disp: , Rfl:  .  carvedilol (COREG) 12.5 MG tablet, Take 1 tablet (12.5 mg total) by mouth 2 (two) times daily with a meal., Disp: 180 tablet, Rfl: 3 .  cetirizine (ZYRTEC) 10 MG tablet, Take 10 mg by mouth daily., Disp: , Rfl:  .  citalopram (CELEXA) 10 MG tablet, Take 5 mg by mouth daily. , Disp: , Rfl:  .  dicyclomine (BENTYL) 20 MG tablet, Take 20 mg by mouth 4 (four) times daily., Disp: , Rfl:  .  ENTRESTO 24-26 MG, TAKE ONE TABLET BY MOUTH TWICE A DAY (Patient taking differently: Take 1 tablet by mouth 2 (two) times daily. ), Disp: 60 tablet, Rfl: 4 .  febuxostat (ULORIC) 40 MG tablet, Take 80 mg by mouth daily. , Disp: , Rfl:  .  folic acid (FOLVITE) 1 MG tablet, Take 1 mg by mouth daily., Disp: , Rfl:  .  furosemide (LASIX) 40 MG tablet, Take 1 tablet (40 mg total) by mouth daily. (Patient taking  differently: Take 40 mg by mouth every other day. ), Disp: 90 tablet, Rfl: 3 .  hydroxyurea (HYDREA) 500 MG capsule, TAKE TWO CAPSULES BY MOUTH DAILY (Patient taking differently: Take 500 mg by mouth in the morning and at bedtime. ), Disp: 180 capsule, Rfl: 5 .  insulin degludec (TRESIBA FLEXTOUCH) 100 UNIT/ML SOPN FlexTouch Pen, Inject 20 Units into the skin daily after breakfast. , Disp: , Rfl:  .  Magnesium Oxide 400 MG CAPS, Take 2 capsules (800 mg total) by mouth in the morning and at bedtime., Disp: 90 capsule, Rfl: 1 .  methotrexate 2.5 MG tablet, Take 12.5 mg by mouth See admin instructions. Take 12.5 mg by mouth in the morning and 12.5 mg in the evening ONLY ON TUESDAYS, Disp: , Rfl:  .  omeprazole (PRILOSEC) 20 MG capsule, Take 20 mg by mouth 2 (two) times daily before a meal. , Disp: , Rfl:  .  oxybutynin (DITROPAN-XL) 10 MG 24 hr tablet, Take 10 mg by mouth daily., Disp: , Rfl:  .  predniSONE (DELTASONE) 5 MG tablet, Take 5 mg by mouth in the morning and at bedtime., Disp: , Rfl:  .  rosuvastatin (CRESTOR) 5 MG tablet, Take 5 mg by mouth  daily., Disp: , Rfl:  .  spironolactone (ALDACTONE) 25 MG tablet, Take 0.5 tablets (12.5 mg total) by mouth daily., Disp: 45 tablet, Rfl: 3 .  tamsulosin (FLOMAX) 0.4 MG CAPS capsule, Take 0.4 mg by mouth 2 (two) times daily. , Disp: , Rfl:  No current facility-administered medications for this visit.  Facility-Administered Medications Ordered in Other Visits:  .  0.9 %  sodium chloride infusion, , Intravenous, Continuous, Talayah Picardi, Rudell Cobb, MD, Stopped at 05/16/13 1115  Allergies:  Allergies  Allergen Reactions  . Bupropion Hives  . Cephalexin Diarrhea and Other (See Comments)    Caused C-diff, also   . Fluoxetine Itching  . Ibuprofen Itching  . Prednisone Itching and Other (See Comments)    Abdominal pain, also   . Temazepam Other (See Comments)    Dizziness   . Trazodone And Nefazodone Other (See Comments)    Dizziness  . Fluoxetine Hcl  Itching  . Prozac [Fluoxetine Hcl] Itching    Past Medical History, Surgical history, Social history, and Family History were reviewed and updated.  Review of Systems: Review of Systems  Constitutional: Negative.   HENT: Negative.   Eyes: Negative.   Respiratory: Positive for shortness of breath.   Cardiovascular: Positive for chest pain.  Gastrointestinal: Negative.   Genitourinary: Negative.   Musculoskeletal: Positive for back pain.  Skin: Negative.   Neurological: Negative.   Endo/Heme/Allergies: Negative.   Psychiatric/Behavioral: Negative.     Physical Exam:  weight is 288 lb (130.6 kg). His oral temperature is 97.6 F (36.4 C). His blood pressure is 115/64 and his pulse is 79. His respiration is 20 and oxygen saturation is 96%.   Physical Exam Vitals reviewed.  HENT:     Head: Normocephalic and atraumatic.  Eyes:     Pupils: Pupils are equal, round, and reactive to light.  Cardiovascular:     Rate and Rhythm: Normal rate and regular rhythm.     Heart sounds: Normal heart sounds.  Pulmonary:     Effort: Pulmonary effort is normal.     Breath sounds: Normal breath sounds.  Abdominal:     General: Bowel sounds are normal.     Palpations: Abdomen is soft.  Musculoskeletal:        General: No tenderness or deformity. Normal range of motion.     Cervical back: Normal range of motion.  Lymphadenopathy:     Cervical: No cervical adenopathy.  Skin:    General: Skin is warm and dry.     Findings: No erythema or rash.  Neurological:     Mental Status: He is alert and oriented to person, place, and time.  Psychiatric:        Behavior: Behavior normal.        Thought Content: Thought content normal.        Judgment: Judgment normal.      Lab Results  Component Value Date   WBC 6.5 05/14/2020   HGB 12.4 (L) 05/14/2020   HCT 38.1 (L) 05/14/2020   MCV 110.8 (H) 05/14/2020   PLT 425 (H) 05/14/2020     Chemistry      Component Value Date/Time   NA 141  05/14/2020 0813   NA 140 05/10/2020 0905   NA 141 04/10/2017 0923   NA 140 04/05/2016 0739   K 4.2 05/14/2020 0813   K 3.5 04/10/2017 0923   K 4.2 04/05/2016 0739   CL 105 05/14/2020 0813   CL 108 04/10/2017 0923   CO2 27  05/14/2020 0813   CO2 24 04/10/2017 0923   CO2 20 (L) 04/05/2016 0739   BUN 18 05/14/2020 0813   BUN 14 05/10/2020 0905   BUN 12 04/10/2017 0923   BUN 14.7 04/05/2016 0739   CREATININE 1.02 05/14/2020 0813   CREATININE 1.1 04/10/2017 0923   CREATININE 1.1 04/05/2016 0739      Component Value Date/Time   CALCIUM 9.1 05/14/2020 0813   CALCIUM 8.4 04/10/2017 0923   CALCIUM 9.0 04/05/2016 0739   ALKPHOS 52 05/14/2020 0813   ALKPHOS 77 04/10/2017 0923   ALKPHOS 91 04/05/2016 0739   AST 22 05/14/2020 0813   AST 34 04/05/2016 0739   ALT 24 05/14/2020 0813   ALT 32 04/10/2017 0923   ALT 40 04/05/2016 0739   BILITOT 0.6 05/14/2020 0813   BILITOT 0.48 04/05/2016 0739      Impression and Plan: Mr. Yom is 70 year old gentleman with polycythemia vera.  He is on Hydrea.  He is doing well with the Hydrea.  He does not need to be phlebotomized.  I am happy about that.  I am even happier with the fact that he does not have the diarrhea.  I will now get him back in 2 months.  Hopefully we can continue to move his appointments out a little bit more.  Volanda Napoleon, MD 1/7/20229:03 AM

## 2020-05-19 ENCOUNTER — Encounter: Payer: Self-pay | Admitting: *Deleted

## 2020-05-27 DIAGNOSIS — Z1159 Encounter for screening for other viral diseases: Secondary | ICD-10-CM | POA: Diagnosis not present

## 2020-05-28 ENCOUNTER — Encounter: Payer: Medicare Other | Admitting: Nurse Practitioner

## 2020-05-28 ENCOUNTER — Other Ambulatory Visit (HOSPITAL_COMMUNITY): Payer: Medicare Other

## 2020-06-02 DIAGNOSIS — I428 Other cardiomyopathies: Secondary | ICD-10-CM | POA: Diagnosis not present

## 2020-06-02 DIAGNOSIS — D45 Polycythemia vera: Secondary | ICD-10-CM | POA: Diagnosis not present

## 2020-06-02 DIAGNOSIS — I129 Hypertensive chronic kidney disease with stage 1 through stage 4 chronic kidney disease, or unspecified chronic kidney disease: Secondary | ICD-10-CM | POA: Diagnosis not present

## 2020-06-02 DIAGNOSIS — R809 Proteinuria, unspecified: Secondary | ICD-10-CM | POA: Diagnosis not present

## 2020-06-02 DIAGNOSIS — K219 Gastro-esophageal reflux disease without esophagitis: Secondary | ICD-10-CM | POA: Diagnosis not present

## 2020-06-10 DIAGNOSIS — M47896 Other spondylosis, lumbar region: Secondary | ICD-10-CM | POA: Diagnosis not present

## 2020-06-10 DIAGNOSIS — M7918 Myalgia, other site: Secondary | ICD-10-CM | POA: Diagnosis not present

## 2020-06-10 DIAGNOSIS — M48062 Spinal stenosis, lumbar region with neurogenic claudication: Secondary | ICD-10-CM | POA: Diagnosis not present

## 2020-06-14 ENCOUNTER — Ambulatory Visit (INDEPENDENT_AMBULATORY_CARE_PROVIDER_SITE_OTHER): Payer: Medicare Other

## 2020-06-14 DIAGNOSIS — I5022 Chronic systolic (congestive) heart failure: Secondary | ICD-10-CM

## 2020-06-14 DIAGNOSIS — Z950 Presence of cardiac pacemaker: Secondary | ICD-10-CM

## 2020-06-17 ENCOUNTER — Ambulatory Visit (INDEPENDENT_AMBULATORY_CARE_PROVIDER_SITE_OTHER): Payer: Medicare Other

## 2020-06-17 DIAGNOSIS — I441 Atrioventricular block, second degree: Secondary | ICD-10-CM

## 2020-06-18 LAB — CUP PACEART REMOTE DEVICE CHECK
Battery Remaining Longevity: 48 mo
Battery Remaining Percentage: 56 %
Battery Voltage: 2.89 V
Brady Statistic AP VP Percent: 6.8 %
Brady Statistic AP VS Percent: 1 %
Brady Statistic AS VP Percent: 91 %
Brady Statistic AS VS Percent: 1 %
Brady Statistic RA Percent Paced: 5.3 %
Date Time Interrogation Session: 20220211103509
Implantable Lead Implant Date: 20151223
Implantable Lead Implant Date: 20151223
Implantable Lead Implant Date: 20151223
Implantable Lead Location: 753858
Implantable Lead Location: 753859
Implantable Lead Location: 753860
Implantable Pulse Generator Implant Date: 20151223
Lead Channel Impedance Value: 410 Ohm
Lead Channel Impedance Value: 410 Ohm
Lead Channel Impedance Value: 730 Ohm
Lead Channel Pacing Threshold Amplitude: 1 V
Lead Channel Pacing Threshold Amplitude: 1.125 V
Lead Channel Pacing Threshold Amplitude: 1.375 V
Lead Channel Pacing Threshold Pulse Width: 0.4 ms
Lead Channel Pacing Threshold Pulse Width: 0.4 ms
Lead Channel Pacing Threshold Pulse Width: 0.7 ms
Lead Channel Sensing Intrinsic Amplitude: 12 mV
Lead Channel Sensing Intrinsic Amplitude: 4.8 mV
Lead Channel Setting Pacing Amplitude: 2.125
Lead Channel Setting Pacing Amplitude: 2.375
Lead Channel Setting Pacing Amplitude: 2.5 V
Lead Channel Setting Pacing Pulse Width: 0.4 ms
Lead Channel Setting Pacing Pulse Width: 0.7 ms
Lead Channel Setting Sensing Sensitivity: 2 mV
Pulse Gen Model: 3242
Pulse Gen Serial Number: 7701275

## 2020-06-18 NOTE — Progress Notes (Signed)
EPIC Encounter for ICM Monitoring  Patient Name: William Villanueva is a 70 y.o. male Date: 06/18/2020 Primary Care Physican: Antony Contras, MD Primary Cardiologist:Crenshaw Electrophysiologist:Klein Bi-V Pacing:98% 05/14/2020 Office Weight: 288 lbs   Transmission reviewed.   CorVue thoracic impedancesuggestingnormal fluid levels.  Prescribed:   Furosemide40 mg take 1 tablet daily.  Patient takes differently: taking Furosemide every other day.   Spironolactone 25 mg take 0.5 tablet (12.5 mg total) once a day  Labs: 05/10/2020 Creatinine 1.06, BUN 14, Potassium 4.2, Sodium 140, GFR 72-83 03/26/2020 Creatinine 0.97, BUN 23, Potassium 4.1, Sodium 140, GFR >60 03/10/2020 Creatinine 0.99, BUN 14, Potassium 4.0, Sodium 144, GFR 78-90 02/18/2020 Creatinine1.10, BUN11, Potassium3.3, Sodium140, GFR>60 02/17/2020 Creatinine1.11, BUN16, Potassium3.5, SELTRV202, GFR>60  02/16/2020 Creatinine1.21, BUN31, Potassium3.5, Sodium141, GFR>60  02/15/2020 Creatinine1.58, BUN46, Potassium3.7, Sodium140, GFR44  02/14/2020 Creatinine1.60, BUN49, Potassium4.3, [id number], GFR44  02/13/2020 Creatinine2.09, BUN60, Potassium3.6, Sodium133  02/12/2020 Creatinine2.12, BUN61, Potassium3.1, Sodium133 A complete set of results can be found in Results Review.  Recommendations:No changes.  Follow-up plan: ICM clinic phone appointment on3/14/2022. 91 day device clinic remote transmission5/04/2021.   EP/Cardiology Office Visits:06/21/2020 with Chanetta Marshall, NP.  Copy of ICM check sent to [place].  3 month ICM trend: 06/14/2020.    1 Year ICM trend:       Rosalene Billings, RN 06/18/2020 9:28 AM

## 2020-06-21 ENCOUNTER — Ambulatory Visit (HOSPITAL_COMMUNITY): Payer: Medicare Other | Attending: Cardiology

## 2020-06-21 ENCOUNTER — Telehealth: Payer: Self-pay | Admitting: Cardiology

## 2020-06-21 ENCOUNTER — Other Ambulatory Visit: Payer: Self-pay | Admitting: Internal Medicine

## 2020-06-21 ENCOUNTER — Other Ambulatory Visit: Payer: Self-pay

## 2020-06-21 ENCOUNTER — Ambulatory Visit: Payer: Medicare Other | Admitting: Nurse Practitioner

## 2020-06-21 DIAGNOSIS — I5022 Chronic systolic (congestive) heart failure: Secondary | ICD-10-CM

## 2020-06-21 DIAGNOSIS — I429 Cardiomyopathy, unspecified: Secondary | ICD-10-CM | POA: Diagnosis not present

## 2020-06-21 DIAGNOSIS — I420 Dilated cardiomyopathy: Secondary | ICD-10-CM | POA: Insufficient documentation

## 2020-06-21 LAB — ECHOCARDIOGRAM LIMITED
Area-P 1/2: 3.68 cm2
S' Lateral: 5 cm

## 2020-06-21 MED ORDER — CARVEDILOL 12.5 MG PO TABS: ORAL_TABLET | ORAL | 3 refills | Status: DC

## 2020-06-21 NOTE — Patient Instructions (Addendum)
Medication Instructions:  Your physician has recommended you make the following change in your medication:   INCREASE: Carvedilol - Take 12.5mg  in the morning; 18.75mg  (1 1/2) in the afternoon  *If you need a refill on your cardiac medications before your next appointment, please call your pharmacy*   Lab Work: None If you have labs (blood work) drawn today and your tests are completely normal, you will receive your results only by: [name] MyChart Message (if you have MyChart) OR . A paper copy in the mail If you have any lab test that is abnormal or we need to change your treatment, we will call you to review the results.   Follow-Up: At [facility], you and your health needs are our priority.  As part of our continuing mission to provide you with exceptional heart care, we have created designated Provider Care Teams.  These Care Teams include your primary Cardiologist (physician) and Advanced Practice Providers (APPs -  Physician Assistants and Nurse Practitioners) who all work together to provide you with the care you need, when you need it.  07/14/2020 with Dr Caryl Comes

## 2020-06-21 NOTE — Telephone Encounter (Signed)
°*  STAT* If patient is at the pharmacy, call can be transferred to refill team.   1. Which medications need to be refilled? (please list name of each medication and dose if known) needs a new prescription for Entresto  2. Which pharmacy/location (including street and city if local pharmacy) is medication to be sent to? [facility] [employer]  3. Do they need a 30 day or 90 day supply?90 days

## 2020-06-21 NOTE — Progress Notes (Signed)
    AV Optimization Echo Note Date: 06/21/2020  ID:  [name], DOB 12-11-1950, MRN [id number]  PCP: Antony Contras, MD Primary Cardiologist: Stanford Breed Electrophysiologist: William Villanueva is a 70 y.o. male is seen today for Dr Caryl Comes.  He presents today for AV optimization of CRT.  The patient is predominately atrially sensed.  Device was programmed with a  PAV of 160 and SAV of 110 on presentation.  Sensed AV delays were evaluated from 90 msec to 150 msec.  Optimal separation of E/A waves was noted at 100 msec.    Device was reprogrammed today to a SAV of 140mec and a PAV of 1651mc.    Frequent PVC's were noted during interrogation. Coreg increased to 12.48m31mam and 18.748m40mm (he has had orthostatic intolerance in the past so will increase gradually).  Follow up with Dr KleiCaryl Comes6 weeks.   Signed, AmbeChanetta Marshall  06/21/2020 9:50 AM     [facility] [phone]) [phone]

## 2020-06-22 MED ORDER — ENTRESTO 24-26 MG PO TABS: 1.0000 | ORAL_TABLET | Freq: Two times a day (BID) | ORAL | 4 refills | Status: DC

## 2020-06-23 NOTE — Progress Notes (Signed)
Remote pacemaker transmission.   

## 2020-07-05 ENCOUNTER — Encounter: Payer: Self-pay | Admitting: Pulmonary Disease

## 2020-07-05 ENCOUNTER — Ambulatory Visit: Payer: Medicare Other | Admitting: Pulmonary Disease

## 2020-07-05 ENCOUNTER — Institutional Professional Consult (permissible substitution): Payer: Medicare Other | Admitting: Internal Medicine

## 2020-07-05 ENCOUNTER — Other Ambulatory Visit: Payer: Self-pay

## 2020-07-05 VITALS — BP 100/60 | HR 75 | Temp 97.6°F | Ht 70.0 in | Wt 272.6 lb

## 2020-07-05 DIAGNOSIS — J449 Chronic obstructive pulmonary disease, unspecified: Secondary | ICD-10-CM

## 2020-07-05 MED ORDER — BREZTRI AEROSPHERE 160-9-4.8 MCG/ACT IN AERO: 2.0000 | INHALATION_SPRAY | Freq: Two times a day (BID) | RESPIRATORY_TRACT | 0 refills | Status: DC

## 2020-07-05 MED ORDER — ALBUTEROL SULFATE HFA 108 (90 BASE) MCG/ACT IN AERS: 2.0000 | INHALATION_SPRAY | Freq: Four times a day (QID) | RESPIRATORY_TRACT | 6 refills | Status: DC | PRN

## 2020-07-05 NOTE — Patient Instructions (Signed)
Emphysema/COPD GOLD D --START Breztri TWO puffs TWICE a day --START Albuterol TWO puffs AS NEEDED  Follow-up with me in one month

## 2020-07-05 NOTE — Progress Notes (Signed)
Subjective:   PATIENT ID: William Villanueva GENDER: male DOB: Jun 22, 1950, MRN: [id number]   HPI  Chief Complaint  Patient presents with  . Consult    Patient has shortness of breath most of the time worse with exertion, HX of COPD, Has some voice hoarseness. 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  He has previously been seen by [facility] with Dr. Lamonte Sakai in 2019 for his moderately severe COPD (FEV1 68% in 2016). He has tried multiple inhalers including Stiolto, Spiriva and Anoro. He is unable to tolerate powdered inhalers due to hoarseness in the past.   Today, he reports shortness of breath of breath at rest and with exertion. Associated with wheezing. Talking causes dyspnea. Denies cough. He cannot walk short distances including to the bathroom. Ambulation is limited by back pain. He is currently not on any bronchodilators. He does report a diagnosis of OSA but currently not using his CPAP due to having difficulty cleaning/managing device. Denies chest pain. Denies recent exacerbation requiring steroids or antibiotics.  Quit 2014.  Social History: Former smoker. Quit in 2015  I have personally reviewed patient's past medical/family/social history, allergies, current medications.  Past Medical History:  Diagnosis Date  . AV block, 2nd degree 2015   [employer] Allure Quadra pulse generator [id number], model PM 3242  . Back pain   . Cardiomyopathy ([place])    Nonischemic 45%.   . CHF (congestive heart failure) ([place])   . Gout   . Hemochromatosis   . Hypertension   . Hypospadias 1950/12/17   born with  . Nephrolithiasis   . 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 Heart disease Maternal Grandmother        Pacemaker, MI  .  Stroke Mother   . Other Father        Deceased, car fell on him  . Diabetes Sister   . Hypertension Sister      Social History   Occupational History  . Not on file  Tobacco Use  . Smoking status: Former Smoker    Packs/day: 1.00    Years: 44.00    Pack years: 44.00    Types: Cigarettes    Start date: 06/05/1968    Quit date: 04/05/2013    Years since quitting: 7.2  . Smokeless tobacco: Never Used  . Tobacco comment: quit in 2014  Vaping Use  . Vaping Use: Never used  Substance and Sexual Activity  . Alcohol use: Yes    Alcohol/week: 0.0 standard drinks    Comment: rare  . Drug use: No  . Sexual activity: Not Currently    Allergies  Allergen Reactions  . Bupropion Hives  . Cephalexin Diarrhea and Other (See Comments)    Caused C-diff, also   . Fluoxetine Itching  . Ibuprofen Itching  . Prednisone Itching and Other (See Comments)    Abdominal pain, also   . Temazepam Other (See Comments)    Dizziness   . Trazodone And Nefazodone Other (See Comments)    Dizziness  . Fluoxetine Hcl Itching  . Prozac [Fluoxetine Hcl] Itching     Outpatient Medications Prior to Visit  Medication Sig Dispense Refill  . allopurinol (ZYLOPRIM) 300  MG tablet Take 300 mg by mouth daily.    . BD PEN NEEDLE NANO U/F 32G X 4 MM MISC     . carvedilol (COREG) 12.5 MG tablet Take 12.62m in the morning and 18.753m(1 1/2) in the evening 180 tablet 3  . cetirizine (ZYRTEC) 10 MG tablet Take 10 mg by mouth daily.    . citalopram (CELEXA) 10 MG tablet Take 5 mg by mouth daily.     . Marland Kitchenicyclomine (BENTYL) 20 MG tablet Take 20 mg by mouth 4 (four) times daily.    . febuxostat (ULORIC) 40 MG tablet Take 80 mg by mouth daily.     . folic acid (FOLVITE) 1 MG tablet Take 1 mg by mouth daily.    . furosemide (LASIX) 40 MG tablet Take 1 tablet (40 mg total) by mouth daily. (Patient taking differently: Take 40 mg by mouth every other day.) 90 tablet 3  . hydroxyurea (HYDREA) 500 MG capsule TAKE TWO  CAPSULES BY MOUTH DAILY (Patient taking differently: Take 500 mg by mouth in the morning and at bedtime.) 180 capsule 5  . insulin degludec (TRESIBA FLEXTOUCH) 100 UNIT/ML SOPN FlexTouch Pen Inject 20 Units into the skin daily after breakfast.     . Magnesium Oxide 400 MG CAPS Take 2 capsules (800 mg total) by mouth in the morning and at bedtime. 90 capsule 1  . methotrexate 2.5 MG tablet Take 12.5 mg by mouth See admin instructions. Take 12.5 mg by mouth in the morning and 12.5 mg in the evening ONLY ON TUESDAYS    . omeprazole (PRILOSEC) 20 MG capsule Take 20 mg by mouth 2 (two) times daily before a meal.     . oxybutynin (DITROPAN-XL) 10 MG 24 hr tablet Take 10 mg by mouth daily.    . predniSONE (DELTASONE) 5 MG tablet Take 5 mg by mouth in the morning and at bedtime.    . rosuvastatin (CRESTOR) 5 MG tablet Take 5 mg by mouth daily.    . sacubitril-valsartan (ENTRESTO) 24-26 MG Take 1 tablet by mouth 2 (two) times daily. 60 tablet 4  . spironolactone (ALDACTONE) 25 MG tablet Take 0.5 tablets (12.5 mg total) by mouth daily. 45 tablet 3  . tamsulosin (FLOMAX) 0.4 MG CAPS capsule Take 0.4 mg by mouth 2 (two) times daily.      Facility-Administered Medications Prior to Visit  Medication Dose Route Frequency Provider Last Rate Last Admin  . 0.9 %  sodium chloride infusion   Intravenous Continuous EnVolanda NapoleonMD   Stopped at 05/16/13 1115    Review of Systems  Constitutional: Negative for chills, diaphoresis, fever, malaise/fatigue and weight loss.  HENT: Negative for congestion, ear pain and sore throat.   Respiratory: Positive for shortness of breath and wheezing. Negative for cough, hemoptysis and sputum production.   Cardiovascular: Negative for chest pain, palpitations and leg swelling.  Gastrointestinal: Negative for abdominal pain, heartburn and nausea.  Genitourinary: Negative for frequency.  Musculoskeletal: Negative for joint pain and myalgias.  Skin: Negative for itching and  rash.  Neurological: Negative for dizziness, weakness and headaches.  Endo/Heme/Allergies: Does not bruise/bleed easily.  Psychiatric/Behavioral: Negative for depression. The patient is not nervous/anxious.      Objective:   Vitals:   07/05/20 1036  BP: 100/60  Pulse: 75  Temp: 97.6 F (36.4 C)  TempSrc: Temporal  SpO2: 97%  Weight: 272 lb 9.6 oz (123.7 kg)  Height: 5' 10"  (1.778 m)   SpO2: 97 % O2 Device:  None (Room air)  Physical Exam: General: Well-appearing, no acute distress HENT: [place], AT Eyes: EOMI, no scleral icterus Respiratory: Diminished breath sounds bilaterally.  No crackles, wheezing or rales Cardiovascular: RRR, -M/R/G, no JVD Extremities:-Edema,-tenderness Neuro: AAO x4, CNII-XII grossly intact Skin: Intact, no rashes or bruising Psych: Normal mood, normal affect  Data Reviewed:  Imaging: CXR 02/04/20 - No infiltrate, effusion or edema. AICD present  PFT: 06/04/17 FVC 3.10 (67%) FEV1 2.17 (63%) Ratio 65  TLC 86% RV/TLC 136% DLCO 48% Interpretation: Moderate obstructive defect with air trapping an reduced gas exchange consistent with emphysema. No significant bronchodilator response.  Labs: CBC    Component Value Date/Time   WBC 6.5 05/14/2020 0813   WBC 10.0 02/18/2020 0310   RBC 3.44 (L) 05/14/2020 0813   HGB 12.4 (L) 05/14/2020 0813   HGB 13.5 04/10/2017 0923   HCT 38.1 (L) 05/14/2020 0813   HCT 42.7 04/10/2017 0923   PLT 425 (H) 05/14/2020 0813   PLT 563 (H) 04/10/2017 0923   MCV 110.8 (H) 05/14/2020 0813   MCV 96 04/10/2017 0923   MCH 36.0 (H) 05/14/2020 0813   MCHC 32.5 05/14/2020 0813   RDW 18.3 (H) 05/14/2020 0813   RDW 16.5 (H) 04/10/2017 0923   LYMPHSABS 0.9 05/14/2020 0813   LYMPHSABS 1.6 04/10/2017 0923   MONOABS 0.3 05/14/2020 0813   EOSABS 0.1 05/14/2020 0813   EOSABS 0.3 04/10/2017 0923   BASOSABS 0.2 (H) 05/14/2020 0813   BASOSABS 0.4 (H) 04/10/2017 0923   Absolute eosinophils 05/14/20 - 100   Imaging, labs and test  noted above have been reviewed independently by me.    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 His dyspnea is likely related to uncontrolled COPD, untreated OSA and deconditioning. Will start with optimizing his inhalers. At next visit, will need to reassess his sleep apnea and discuss benefits of treatment.  Emphysema/COPD GOLD D - uncontrolled --START Breztri TWO puffs TWICE a day --START Albuterol TWO puffs AS NEEDED  Health Maintenance Immunization History  Administered Date(s) Administered  . DTaP 01/26/2016  . Influenza Split 01/21/2015  . Influenza, High Dose Seasonal PF 02/23/2017, 03/22/2018, 01/10/2019, 03/01/2020  . Influenza,inj,Quad PF,6+ Mos 01/21/2015, 01/27/2016, 02/05/2017  . Influenza-Unspecified 01/26/2016  . PFIZER(Purple Top)SARS-COV-2 Vaccination 06/21/2019, 07/13/2019, 04/16/2020  . Pneumococcal Conjugate-13 01/27/2016, 01/09/2018  . Pneumococcal Polysaccharide-23 01/21/2015  . Pneumococcal-Unspecified 01/26/2016  . Tdap 01/26/2011, 01/27/2016, 08/09/2019  . Zoster 08/29/2019   CT Lung Screen - will discuss at next visit  No orders of the defined types were placed in this encounter.  Meds ordered this encounter  Medications  . Budeson-Glycopyrrol-Formoterol (BREZTRI AEROSPHERE) 160-9-4.8 MCG/ACT AERO    Sig: Inhale 2 puffs into the lungs 2 (two) times daily.    Dispense:  10.7 g    Refill:  0    Order Specific Question:   Lot Number?    Answer:   [id number]    Order Specific Question:   Expiration Date?    Answer:   11/05/2021    Order Specific Question:   Manufacturer?    Answer:   Astra[employer]en[employer] [[id number]]    Order Specific Question:   Quantity    Answer:   2  . albuterol (VENTOLIN HFA) 108 (90 Base) MCG/ACT inhaler    Sig: Inhale 2 puffs into the lungs every 6 (six) hours as  needed for wheezing or  shortness of breath.    Dispense:  8 g    Refill:  6    Return in about 1 month (around 08/02/2020).  Hope, MD [facility] 07/05/2020 10:44 AM  Office Number [phone]

## 2020-07-09 DIAGNOSIS — R0981 Nasal congestion: Secondary | ICD-10-CM | POA: Diagnosis not present

## 2020-07-09 DIAGNOSIS — R059 Cough, unspecified: Secondary | ICD-10-CM | POA: Diagnosis not present

## 2020-07-13 ENCOUNTER — Telehealth: Payer: Self-pay | Admitting: *Deleted

## 2020-07-13 ENCOUNTER — Inpatient Hospital Stay: Payer: Medicare Other | Admitting: Hematology & Oncology

## 2020-07-13 ENCOUNTER — Encounter: Payer: Self-pay | Admitting: Hematology & Oncology

## 2020-07-13 ENCOUNTER — Other Ambulatory Visit: Payer: Self-pay

## 2020-07-13 ENCOUNTER — Inpatient Hospital Stay: Payer: Medicare Other | Attending: Hematology & Oncology

## 2020-07-13 DIAGNOSIS — Z950 Presence of cardiac pacemaker: Secondary | ICD-10-CM | POA: Diagnosis not present

## 2020-07-13 DIAGNOSIS — I441 Atrioventricular block, second degree: Secondary | ICD-10-CM | POA: Diagnosis not present

## 2020-07-13 DIAGNOSIS — Z7982 Long term (current) use of aspirin: Secondary | ICD-10-CM | POA: Diagnosis not present

## 2020-07-13 DIAGNOSIS — D45 Polycythemia vera: Secondary | ICD-10-CM | POA: Insufficient documentation

## 2020-07-13 LAB — CMP (CANCER CENTER ONLY)
ALT: 9 U/L (ref 0–44)
AST: 12 U/L — ABNORMAL LOW (ref 15–41)
Albumin: 3.9 g/dL (ref 3.5–5.0)
Alkaline Phosphatase: 67 U/L (ref 38–126)
Anion gap: 9 (ref 5–15)
BUN: 24 mg/dL — ABNORMAL HIGH (ref 8–23)
CO2: 21 mmol/L — ABNORMAL LOW (ref 22–32)
Calcium: 9 mg/dL (ref 8.9–10.3)
Chloride: 105 mmol/L (ref 98–111)
Creatinine: 1.44 mg/dL — ABNORMAL HIGH (ref 0.61–1.24)
GFR, Estimated: 53 mL/min — ABNORMAL LOW (ref >60)
Glucose, Bld: 126 mg/dL — ABNORMAL HIGH (ref 70–99)
Potassium: 4.5 mmol/L (ref 3.5–5.1)
Sodium: 135 mmol/L (ref 135–145)
Total Bilirubin: 0.4 mg/dL (ref 0.3–1.2)
Total Protein: 6.8 g/dL (ref 6.5–8.1)

## 2020-07-13 LAB — CBC WITH DIFFERENTIAL (CANCER CENTER ONLY)
Abs Immature Granulocytes: 0.1 10*3/uL — ABNORMAL HIGH (ref 0.00–0.07)
Basophils Absolute: 0.1 10*3/uL (ref 0.0–0.1)
Basophils Relative: 2 %
Eosinophils Absolute: 0 10*3/uL (ref 0.0–0.5)
Eosinophils Relative: 0 %
HCT: 37 % — ABNORMAL LOW (ref 39.0–52.0)
Hemoglobin: 12.1 g/dL — ABNORMAL LOW (ref 13.0–17.0)
Immature Granulocytes: 1 %
Lymphocytes Relative: 15 %
Lymphs Abs: 1.2 10*3/uL (ref 0.7–4.0)
MCH: 36.9 pg — ABNORMAL HIGH (ref 26.0–34.0)
MCHC: 32.7 g/dL (ref 30.0–36.0)
MCV: 112.8 fL — ABNORMAL HIGH (ref 80.0–100.0)
Monocytes Absolute: 0.7 10*3/uL (ref 0.1–1.0)
Monocytes Relative: 9 %
Neutro Abs: 5.8 10*3/uL (ref 1.7–7.7)
Neutrophils Relative %: 73 %
Platelet Count: 192 10*3/uL (ref 150–400)
RBC: 3.28 MIL/uL — ABNORMAL LOW (ref 4.22–5.81)
RDW: 16 % — ABNORMAL HIGH (ref 11.5–15.5)
WBC Count: 7.9 10*3/uL (ref 4.0–10.5)
nRBC: 0.3 % — ABNORMAL HIGH (ref 0.0–0.2)

## 2020-07-13 LAB — IRON AND TIBC
Iron: 59 ug/dL (ref 42–163)
Saturation Ratios: 19 % — ABNORMAL LOW (ref 20–55)
TIBC: 313 ug/dL (ref 202–409)
UIBC: 254 ug/dL (ref 117–376)

## 2020-07-13 LAB — LACTATE DEHYDROGENASE: LDH: 272 U/L — ABNORMAL HIGH (ref 98–192)

## 2020-07-13 LAB — FERRITIN: Ferritin: 107 ng/mL (ref 24–336)

## 2020-07-13 NOTE — Telephone Encounter (Signed)
Per los 07/13/20 gave patient calendar of upcoming appointments - view mychart

## 2020-07-13 NOTE — Progress Notes (Signed)
Hematology and Oncology Follow Up Visit  Sade Mehlhoff [id number] 1950/11/25 70 y.o. 07/13/2020   Principle Diagnosis:   Polycythemia vera-JAK2 positive  Heart block-Mobitz II  Current Therapy:    Hydrea 500 mg p.o.daily -dose changed on 07/13/2020  Aspirin 81 mg by mouth daily  Phlebotomy to maintain hematocrit below 45%     Interim History:  Mr.  Cadman is back for followup.  A couple weeks ago, he fell at home.  Thankfully, he did not break anything.  He did take a neighbor to come over to help pull them up.  He is doing okay although he does have some breathing problems.  He does have some emphysema.  There is been no issues with nausea or vomiting.  He has had episodes problems with diarrhea.  This seems to be under much better control.  He has had no cardiac issues.  He does have a pacemaker in place.  There is been no problems with fever.  He has had no rashes.  There is been no leg swelling.  We last saw him back in January, his ferritin was 32 with an iron saturation of 33%.  Overall, his performance status is ECOG 1-2.     Medications:  Current Outpatient Medications:  .  allopurinol (ZYLOPRIM) 300 MG tablet, Take 300 mg by mouth daily., Disp: , Rfl:  .  BD PEN NEEDLE NANO U/F 32G X 4 MM MISC, , Disp: , Rfl:  .  Budeson-Glycopyrrol-Formoterol (BREZTRI AEROSPHERE) 160-9-4.8 MCG/ACT AERO, Inhale 2 puffs into the lungs 2 (two) times daily., Disp: 10.7 g, Rfl: 0 .  carvedilol (COREG) 12.5 MG tablet, Take 12.69m in the morning and 18.790m(1 1/2) in the evening, Disp: 180 tablet, Rfl: 3 .  citalopram (CELEXA) 10 MG tablet, Take 5 mg by mouth daily. , Disp: , Rfl:  .  colestipol (COLESTID) 1 g tablet, Take 1 tablet by mouth in the morning, at noon, and at bedtime., Disp: , Rfl:  .  dicyclomine (BENTYL) 20 MG tablet, Take 20 mg by mouth 4 (four) times daily., Disp: , Rfl:  .  folic acid (FOLVITE) 1 MG tablet, Take 1 mg by mouth daily., Disp: , Rfl:  .  gabapentin  (NEURONTIN) 300 MG capsule, Take 300 mg by mouth daily., Disp: , Rfl:  .  hydroxyurea (HYDREA) 500 MG capsule, TAKE TWO CAPSULES BY MOUTH DAILY (Patient taking differently: Take 500 mg by mouth in the morning and at bedtime.), Disp: 180 capsule, Rfl: 5 .  insulin degludec (TRESIBA FLEXTOUCH) 100 UNIT/ML SOPN FlexTouch Pen, Inject 20 Units into the skin daily after breakfast. , Disp: , Rfl:  .  Magnesium Oxide 400 MG CAPS, Take 2 capsules (800 mg total) by mouth in the morning and at bedtime., Disp: 90 capsule, Rfl: 1 .  omeprazole (PRILOSEC) 20 MG capsule, Take 20 mg by mouth 2 (two) times daily before a meal. , Disp: , Rfl:  .  oxybutynin (DITROPAN-XL) 10 MG 24 hr tablet, Take 10 mg by mouth daily., Disp: , Rfl:  .  rosuvastatin (CRESTOR) 5 MG tablet, Take 5 mg by mouth daily., Disp: , Rfl:  .  sacubitril-valsartan (ENTRESTO) 24-26 MG, Take 1 tablet by mouth 2 (two) times daily., Disp: 60 tablet, Rfl: 4 .  spironolactone (ALDACTONE) 25 MG tablet, Take 0.5 tablets (12.5 mg total) by mouth daily., Disp: 45 tablet, Rfl: 3 .  tamsulosin (FLOMAX) 0.4 MG CAPS capsule, Take 0.4 mg by mouth 2 (two) times daily. , Disp: , Rfl:  .  traMADol (ULTRAM) 50 MG tablet, Take 50 mg by mouth as needed., Disp: , Rfl:  .  albuterol (VENTOLIN HFA) 108 (90 Base) MCG/ACT inhaler, Inhale 2 puffs into the lungs every 6 (six) hours as needed for wheezing or shortness of breath. (Patient not taking: Reported on 07/13/2020), Disp: 8 g, Rfl: 6 .  furosemide (LASIX) 40 MG tablet, Take 1 tablet (40 mg total) by mouth daily. (Patient taking differently: Take 40 mg by mouth every other day.), Disp: 90 tablet, Rfl: 3 .  methotrexate 2.5 MG tablet, Take 12.5 mg by mouth See admin instructions. Take 12.5 mg by mouth in the morning and 12.5 mg in the evening ONLY ON TUESDAYS (Patient not taking: No sig reported), Disp: , Rfl:  No current facility-administered medications for this visit.  Facility-Administered Medications Ordered in Other  Visits:  .  0.9 %  sodium chloride infusion, , Intravenous, Continuous, Camera Krienke, Rudell [place], MD, Stopped at 05/16/13 1115  Allergies:  Allergies  Allergen Reactions  . Bupropion Hives  . Cephalexin Diarrhea and Other (See Comments)    Caused C-diff, also   . Fluoxetine Itching  . Ibuprofen Itching  . Prednisone Itching and Other (See Comments)    Abdominal pain, also   . Temazepam Other (See Comments)    Dizziness   . Trazodone And Nefazodone Other (See Comments)    Dizziness  . Fluoxetine Hcl Itching  . Prozac [Fluoxetine Hcl] Itching    Past Medical History, Surgical history, Social history, and Family History were reviewed and updated.  Review of Systems: Review of Systems  Constitutional: Negative.   HENT: Negative.   Eyes: Negative.   Respiratory: Positive for shortness of breath.   Cardiovascular: Positive for chest pain.  Gastrointestinal: Negative.   Genitourinary: Negative.   Musculoskeletal: Positive for back pain.  Skin: Negative.   Neurological: Negative.   Endo/Heme/Allergies: Negative.   Psychiatric/Behavioral: Negative.     Physical Exam:  vitals were not taken for this visit.   Physical Exam Vitals reviewed.  HENT:     Head: Normocephalic and atraumatic.  Eyes:     Pupils: Pupils are equal, round, and reactive to light.  Cardiovascular:     Rate and Rhythm: Normal rate and regular rhythm.     Heart sounds: Normal heart sounds.  Pulmonary:     Effort: Pulmonary effort is normal.     Breath sounds: Normal breath sounds.  Abdominal:     General: Bowel sounds are normal.     Palpations: Abdomen is soft.  Musculoskeletal:        General: No tenderness or deformity. Normal range of motion.     Cervical back: Normal range of motion.  Lymphadenopathy:     Cervical: No cervical adenopathy.  Skin:    General: Skin is warm and dry.     Findings: No erythema or rash.  Neurological:     Mental Status: He is alert and oriented to person, place, and  time.  Psychiatric:        Behavior: Behavior normal.        Thought Content: Thought content normal.        Judgment: Judgment normal.      Lab Results  Component Value Date   WBC 7.9 07/13/2020   HGB 12.1 (L) 07/13/2020   HCT 37.0 (L) 07/13/2020   MCV 112.8 (H) 07/13/2020   PLT 192 07/13/2020     Chemistry      Component Value Date/Time   NA 141 05/14/2020  0813   NA 140 05/10/2020 0905   NA 141 04/10/2017 0923   NA 140 04/05/2016 0739   K 4.2 05/14/2020 0813   K 3.5 04/10/2017 0923   K 4.2 04/05/2016 0739   CL 105 05/14/2020 0813   CL 108 04/10/2017 0923   CO2 27 05/14/2020 0813   CO2 24 04/10/2017 0923   CO2 20 (L) 04/05/2016 0739   BUN 18 05/14/2020 0813   BUN 14 05/10/2020 0905   BUN 12 04/10/2017 0923   BUN 14.7 04/05/2016 0739   CREATININE 1.02 05/14/2020 0813   CREATININE 1.1 04/10/2017 0923   CREATININE 1.1 04/05/2016 0739      Component Value Date/Time   CALCIUM 9.1 05/14/2020 0813   CALCIUM 8.4 04/10/2017 0923   CALCIUM 9.0 04/05/2016 0739   ALKPHOS 52 05/14/2020 0813   ALKPHOS 77 04/10/2017 0923   ALKPHOS 91 04/05/2016 0739   AST 22 05/14/2020 0813   AST 34 04/05/2016 0739   ALT 24 05/14/2020 0813   ALT 32 04/10/2017 0923   ALT 40 04/05/2016 0739   BILITOT 0.6 05/14/2020 0813   BILITOT 0.48 04/05/2016 0739      Impression and Plan: Mr. Bramel is 70 year old gentleman with polycythemia vera.  He is on Hydrea.  He is doing well with the Hydrea.  In fact, I think we will try to decrease his dose down to 500 mg a day.  I think this would be certainly reasonable.  We will go ahead and plan for another follow-up.  I think 40-monthfollow-up would certainly be very reasonable.  PVolanda Napoleon MD 3/8/20228:08 AM

## 2020-07-19 ENCOUNTER — Ambulatory Visit (INDEPENDENT_AMBULATORY_CARE_PROVIDER_SITE_OTHER): Payer: Medicare Other

## 2020-07-19 DIAGNOSIS — Z950 Presence of cardiac pacemaker: Secondary | ICD-10-CM

## 2020-07-19 DIAGNOSIS — I5022 Chronic systolic (congestive) heart failure: Secondary | ICD-10-CM

## 2020-07-20 NOTE — Progress Notes (Signed)
EPIC Encounter for ICM Monitoring  Patient Name: William Villanueva is a 70 y.o. male Date: 07/20/2020 Primary Care Physican: Antony Contras, MD Primary Cardiologist:Crenshaw Electrophysiologist:Klein Bi-V Pacing:98% 07/20/2020 Weight: 267 lbs   Spoke with patient and reports feeling well at this time.  Denies fluid symptoms.    CorVue thoracic impedancesuggestingnormal fluid levels.  Prescribed:   Furosemide40 mg take 1 tablet daily.  Patient takes differently: taking Furosemide every other day.   Spironolactone 25 mg take 0.5 tablet (12.5 mg total) once a day  Labs: 07/13/2020 Creatinine 1.44, BUN 24, Potassium 4.5, Sodium 135, GFR 53 05/14/2020 Creatinine 1.02, BUN 18, Potassium 4.2, Sodium 141, GFR >60  05/10/2020 Creatinine 1.06, BUN 14, Potassium 4.2, Sodium 140, GFR 72-83 03/26/2020 Creatinine 0.97, BUN 23, Potassium 4.1, Sodium 140, GFR >60 03/10/2020 Creatinine 0.99, BUN 14, Potassium 4.0, Sodium 144, GFR 78-90 A complete set of results can be found in Results Review.  Recommendations:No changes and encouraged to call if experiencing any fluid symptoms.  Follow-up plan: ICM clinic phone appointment on4/18/2022. 91 day device clinic remote transmission5/04/2021.   EP/Cardiology Office Visits:08/03/2020 with Dr Caryl Comes.  Copy of ICM check sent to [place].  3 month ICM trend: 07/19/2020.    1 Year ICM trend:       Rosalene Billings, RN 07/20/2020 8:53 AM

## 2020-07-21 DIAGNOSIS — R197 Diarrhea, unspecified: Secondary | ICD-10-CM | POA: Diagnosis not present

## 2020-07-27 ENCOUNTER — Telehealth: Payer: Self-pay | Admitting: Pulmonary Disease

## 2020-07-27 MED ORDER — BREZTRI AEROSPHERE 160-9-4.8 MCG/ACT IN AERO: 2.0000 | INHALATION_SPRAY | Freq: Two times a day (BID) | RESPIRATORY_TRACT | 5 refills | Status: DC

## 2020-07-27 NOTE — Telephone Encounter (Signed)
Called and spoke with Patient. Patient stated William Villanueva works very well and he had requested a prescription with [facility].  Patient is worried about inhaler cost.  Advised Patient if cost was more then he could afford, to contact insurance company to get a list of preferred inhalers. Advised preferred inhaler list can be given to Dr. Loanne Drilling to advise on inhalers. Breztri prescription sent to requested pharmacy. Nothing further at this time.

## 2020-07-29 DIAGNOSIS — I428 Other cardiomyopathies: Secondary | ICD-10-CM | POA: Insufficient documentation

## 2020-08-03 ENCOUNTER — Encounter: Payer: Self-pay | Admitting: Internal Medicine

## 2020-08-03 ENCOUNTER — Other Ambulatory Visit: Payer: Self-pay

## 2020-08-03 ENCOUNTER — Ambulatory Visit: Payer: Medicare Other | Admitting: Internal Medicine

## 2020-08-03 VITALS — BP 110/68 | HR 83 | Ht 70.0 in | Wt 273.4 lb

## 2020-08-03 DIAGNOSIS — I441 Atrioventricular block, second degree: Secondary | ICD-10-CM

## 2020-08-03 DIAGNOSIS — I5042 Chronic combined systolic (congestive) and diastolic (congestive) heart failure: Secondary | ICD-10-CM | POA: Diagnosis not present

## 2020-08-03 DIAGNOSIS — I429 Cardiomyopathy, unspecified: Secondary | ICD-10-CM | POA: Diagnosis not present

## 2020-08-03 DIAGNOSIS — Z950 Presence of cardiac pacemaker: Secondary | ICD-10-CM | POA: Diagnosis not present

## 2020-08-03 DIAGNOSIS — I428 Other cardiomyopathies: Secondary | ICD-10-CM | POA: Diagnosis not present

## 2020-08-03 DIAGNOSIS — R002 Palpitations: Secondary | ICD-10-CM | POA: Diagnosis not present

## 2020-08-03 MED ORDER — CARVEDILOL 12.5 MG PO TABS: 6.2500 mg | ORAL_TABLET | Freq: Two times a day (BID) | ORAL | 3 refills | Status: DC

## 2020-08-03 MED ORDER — LOSARTAN POTASSIUM 25 MG PO TABS: 25.0000 mg | ORAL_TABLET | Freq: Every day | ORAL | 3 refills | Status: DC

## 2020-08-03 NOTE — Progress Notes (Signed)
Electrophysiology Office Note   Date:  08/03/2020   ID:  William Villanueva, DOB [birth year], MRN [id number]  PCP:  Antony Contras, MD  Cardiologist:  [facility] Primary Electrophysiologist:    [name], MD    No chief complaint on file.    History of Present Illness: William Villanueva is a 70 y.o. male is   seen in follow-up for CRT-P implantation for nonischemic cardiomyopathy. He had presented to 12/15 with symptomatic high-grade heart block. Echo reevaluation 2/16 demonstrated near normalization of LV systolic function; he been readmitted at that time because of pain which dated back to his device implantation. Nuclear medicine 2/16 had an EF of 35%    DATE TEST EF   2/16 Myoview  35%   3/17 Myoview    32 %   1/20 Echo  40-45%   9/21 Echo  30-35%     Worsening shortness of breath and seen by [facility]-NP 8/21.  Diuretics increased.  Echo updated with no change in LV function  Saw [facility]-NP 2/22 with echocardiographic optimization resulting in a decrease of the SAV from 110--100 Without interval improvement  Has had falls.  Denies lightheadedness but "his legs turn to mush, "sometimes also when showers.  Relieved by sitting      Date Cr K Hgb BNP  9/19 1.44 4.1 13.7   9/21  1.56 3.8 15.2 2864     Past Medical History:  Diagnosis Date  . AV block, 2nd degree 2015   St. Jude Allure Quadra pulse generator [id number], model PM 3242  . Back pain   . Cardiomyopathy ([place])    Nonischemic 45%.   . CHF (congestive heart failure) ([place])   . Gout   . Hemochromatosis   . Hypertension   . Hypospadias 14-Feb-1951   born with  . Nephrolithiasis   . 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 ANKLE SURGERY    . BI-VENTRICULAR PACEMAKER INSERTION N/A 04/29/2014   Procedure: BI-VENTRICULAR PACEMAKER INSERTION (CRT-P);  Surgeon: Deboraha Sprang, MD; Laterality: Left  St. Jude Allure Quadra pulse generator [phone], model PM [phone]  . COLONOSCOPY N/A 02/15/2020    Procedure: COLONOSCOPY;  Surgeon: Ronnette Juniper, MD;  Location: [facility];  Service: Gastroenterology;  Laterality: N/A;  . ESOPHAGOGASTRODUODENOSCOPY (EGD) WITH PROPOFOL N/A 02/15/2020   Procedure: ESOPHAGOGASTRODUODENOSCOPY (EGD) WITH PROPOFOL;  Surgeon: Ronnette Juniper, MD;  Location: [facility];  Service: Gastroenterology;  Laterality: N/A;  . PILONIDAL CYST EXCISION    . POSTERIOR LAMINECTOMY / DECOMPRESSION LUMBAR SPINE    . TONSILLECTOMY       Current Outpatient Medications  Medication Sig Dispense Refill  . albuterol (VENTOLIN HFA) 108 (90 Base) MCG/ACT inhaler Inhale 2 puffs into the lungs every 6 (six) hours as needed for wheezing or shortness of breath. 8 g 6  . allopurinol (ZYLOPRIM) 300 MG tablet Take 300 mg by mouth daily.    . BD PEN NEEDLE NANO U/F 32G X 4 MM MISC     . Budeson-Glycopyrrol-Formoterol (BREZTRI AEROSPHERE) 160-9-4.8 MCG/ACT AERO Inhale 2 puffs into the lungs 2 (two) times daily. 10.7 g 5  . carvedilol (COREG) 12.5 MG tablet Take 12.28m in the morning and 18.757m(1 1/2) in the evening 180 tablet 3  . citalopram (CELEXA) 10 MG tablet Take 5 mg by mouth daily.     . colestipol (COLESTID) 1 g tablet Take 1 tablet by mouth in the morning, at noon, and at bedtime.    . dicyclomine (BENTYL)  20 MG tablet Take 20 mg by mouth 4 (four) times daily.    . folic acid (FOLVITE) 1 MG tablet Take 1 mg by mouth daily.    [name] gabapentin (NEURONTIN) 300 MG capsule Take 300 mg by mouth daily.    . hydroxyurea (HYDREA) 500 MG capsule TAKE TWO CAPSULES BY MOUTH DAILY 180 capsule 5  . insulin degludec (TRESIBA FLEXTOUCH) 100 UNIT/ML SOPN FlexTouch Pen Inject 20 Units into the skin daily after breakfast.     . Magnesium Oxide 400 MG CAPS Take 2 capsules (800 mg total) by mouth in the morning and at bedtime. 90 capsule 1  . methotrexate 2.5 MG tablet Take 12.5 mg by mouth See admin instructions. Take 12.5 mg by mouth in the morning and 12.5 mg in the evening ONLY ON TUESDAYS    .  omeprazole (PRILOSEC) 20 MG capsule Take 20 mg by mouth 2 (two) times daily before a meal.     . oxybutynin (DITROPAN-XL) 10 MG 24 hr tablet Take 10 mg by mouth daily.    . rosuvastatin (CRESTOR) 5 MG tablet Take 5 mg by mouth daily.    . sacubitril-valsartan (ENTRESTO) 24-26 MG Take 1 tablet by mouth 2 (two) times daily. 60 tablet 4  . spironolactone (ALDACTONE) 25 MG tablet Take 0.5 tablets (12.5 mg total) by mouth daily. 45 tablet 3  . tamsulosin (FLOMAX) 0.4 MG CAPS capsule Take 0.4 mg by mouth 2 (two) times daily.     . traMADol (ULTRAM) 50 MG tablet Take 50 mg by mouth as needed.    . furosemide (LASIX) 40 MG tablet Take 1 tablet (40 mg total) by mouth daily. (Patient taking differently: Take 40 mg by mouth every other day.) 90 tablet 3   No current facility-administered medications for this visit.   Facility-Administered Medications Ordered in Other Visits  Medication Dose Route Frequency Provider Last Rate Last Admin  . 0.9 %  sodium chloride infusion   Intravenous Continuous Volanda Napoleon, MD   Stopped at 05/16/13 1115    Allergies:   Bupropion, Cephalexin, Fluoxetine, Ibuprofen, Prednisone, Temazepam, Trazodone and nefazodone, Fluoxetine hcl, and Prozac [fluoxetine hcl]     PHYSICAL EXAM: VS:  BP 110/68   Pulse 83   Ht 5' 10"  (1.778 m)   Wt 273 lb 6.4 oz (124 kg)   SpO2 96%   BMI 39.23 kg/m  , BMI Body mass index is 39.23 kg/m. Well developed and well nourished in no acute distress HENT normal Neck supple with JVP-flat Clear Device pocket well healed; without hematoma or erythema.  There is no tethering  Regular rate and rhythm, 2/6* murmur Abd-soft with active BS No Clubbing cyanosis   edema Skin-warm and dry A & Oriented  Grossly normal sensory and motor function   ECG P synchronous pacing with a RS in lead V1 and a QR in lead I. Of the AV delay With reprogram of the AV delay in the offsets, change in the SAV-100--90 and the LV offset to +40 QRS duration was  maintained at about 140 ms and the QRS became upright in lead V1 and negative in lead I   ECG *P-synchronous/ AV  pacing QRS rS V1 and qR 1  ECG 12/15 demonstrated positive QRS lead V1 and negative QRS lead I   Device interrogation is reviewed today in detail.  See PaceArt for details.     ASSESSMENT AND PLAN:  NICM  CHF chronic sys/HFpFF  Hypertension  Morbidly obese   Pacemaker-CRT-St.  Jude     Orthostatic hypotension   The patient continues to struggle with exercise intolerance; mildly volume overloaded.  At this juncture however we have done electrical resynchronization ( using the ECG as a measure-- adjunctive to echo done previously) and  hopefully this will improve cardiac performance.  Given the orthostasis, (See Below) would like to avoid more aggressive diuresis as this point   He also has significantly orthostatic.  I discussed this with Dr. Cherylann Ratel.  We will discontinue his Entresto and insert losartan 25 mg and decrease his carvedilol from 18.75--twelve 6.25.      Signed, [name], MD  08/03/2020 3:26 PM     [facility] [address] [place] [address] [phone] (office) [phone] (fax)

## 2020-08-03 NOTE — Patient Instructions (Signed)
Medication Instructions:  Your physician has recommended you make the following change in your medication:   Decrease your Carvedilol 12.17m (1 tablet) to 6.22m(1/2 tablet) by mouth twice daily  Stop taking Entresto  Begin Losartan 2565m 1 tablet by mouth daily  *If you need a refill on your cardiac medications before your next appointment, please call your pharmacy*   Lab Work: None ordered.  If you have labs (blood work) drawn today and your tests are completely normal, you will receive your results only by: . MMarland KitchenChart Message (if you have MyChart) OR . A paper copy in the mail If you have any lab test that is abnormal or we need to change your treatment, we will call you to review the results.   Testing/Procedures: None ordered.    Follow-Up: [facility] and your health needs are our priority.  As part of our continuing mission to provide you with exceptional heart care, we have created designated Provider Care Teams.  These Care Teams include your primary Cardiologist (physician) and Advanced Practice Providers (APPs -  Physician Assistants and Nurse Practitioners) who all work together to provide you with the care you need, when you need it.  We recommend signing up for the patient portal called "MyChart".  Sign up information is provided on this After Visit Summary.  MyChart is used to connect with patients for Virtual Visits (Telemedicine).  Patients are able to view lab/test results, encounter notes, upcoming appointments, etc.  Non-urgent messages can be sent to your provider as well.   To learn more about what you can do with MyChart, go to [email or web address]  Your next appointment:   12 month(s)  The format for your next appointment:   In Person  Provider:   SteVirl AxeD

## 2020-08-23 ENCOUNTER — Ambulatory Visit (INDEPENDENT_AMBULATORY_CARE_PROVIDER_SITE_OTHER): Payer: Medicare Other

## 2020-08-23 DIAGNOSIS — I5042 Chronic combined systolic (congestive) and diastolic (congestive) heart failure: Secondary | ICD-10-CM | POA: Diagnosis not present

## 2020-08-23 DIAGNOSIS — Z950 Presence of cardiac pacemaker: Secondary | ICD-10-CM

## 2020-08-24 NOTE — Progress Notes (Signed)
EPIC Encounter for ICM Monitoring  Patient Name: William Villanueva is a 70 y.o. male Date: 08/24/2020 Primary Care Physican: Antony Contras, MD Primary Cardiologist:[place] Electrophysiologist:Klein Bi-V Pacing:97% 07/20/2020 Weight: 025KYH   Spoke with patient and he denies fluid symptoms.  He said since [name] was stopped he had a couple of pains in left side of chest that were very brief.  Advised if chest pain returns to go to ER if needed in the event the pain could be related to heart attack.  Advised to discuss any changes such as the chest pain at the 09/08/2020 OV with Dr Stanford Breed.    CorVue thoracic impedancesuggestingnormal fluid levels.  Prescribed:   Furosemide40 mg take 1 tablet daily.  Patient takes differently: takingFurosemide every other day.   Spironolactone 25 mg take 0.5 tablet (12.5 mg total) once a day  Labs: 07/13/2020 Creatinine 1.44, BUN 24, Potassium 4.5, Sodium 135, GFR 53 05/14/2020 Creatinine 1.02, BUN 18, Potassium 4.2, Sodium 141, GFR >60  05/10/2020 Creatinine 1.06, BUN 14, Potassium 4.2, Sodium 140, GFR 72-83 03/26/2020 Creatinine 0.97, BUN 23, Potassium 4.1, Sodium 140, GFR >60 03/10/2020 Creatinine 0.99, BUN 14, Potassium 4.0, Sodium 144, GFR 78-90 A complete set of results can be found in Results Review.  Recommendations: No changes and encouraged to call if experiencing any fluid symptoms.   Advised to use ER for episodes of chest pain.  Follow-up plan: ICM clinic phone appointment on5/24/2022. 91 day device clinic remote transmission5/04/2021.   EP/Cardiology Office Visits:09/08/2020 with Dr Stanford Breed.  Copy of ICM check sent to [facility].   3 month ICM trend: 08/23/2020.    1 Year ICM trend:       Rosalene Billings, RN 08/24/2020 8:53 AM

## 2020-08-25 ENCOUNTER — Encounter: Payer: Self-pay | Admitting: Pulmonary Disease

## 2020-08-25 ENCOUNTER — Other Ambulatory Visit: Payer: Self-pay

## 2020-08-25 ENCOUNTER — Ambulatory Visit: Payer: Medicare Other | Admitting: Pulmonary Disease

## 2020-08-25 VITALS — BP 120/78 | HR 85 | Temp 97.5°F | Ht 70.0 in | Wt 277.0 lb

## 2020-08-25 DIAGNOSIS — J441 Chronic obstructive pulmonary disease with (acute) exacerbation: Secondary | ICD-10-CM

## 2020-08-25 DIAGNOSIS — G4733 Obstructive sleep apnea (adult) (pediatric): Secondary | ICD-10-CM | POA: Diagnosis not present

## 2020-08-25 DIAGNOSIS — J449 Chronic obstructive pulmonary disease, unspecified: Secondary | ICD-10-CM | POA: Diagnosis not present

## 2020-08-25 MED ORDER — BREZTRI AEROSPHERE 160-9-4.8 MCG/ACT IN AERO: 2.0000 | INHALATION_SPRAY | Freq: Two times a day (BID) | RESPIRATORY_TRACT | 5 refills | Status: DC

## 2020-08-25 MED ORDER — BREZTRI AEROSPHERE 160-9-4.8 MCG/ACT IN AERO: 2.0000 | INHALATION_SPRAY | Freq: Two times a day (BID) | RESPIRATORY_TRACT | 0 refills | Status: DC

## 2020-08-25 MED ORDER — PREDNISONE 20 MG PO TABS: 20.0000 mg | ORAL_TABLET | Freq: Every day | ORAL | 0 refills | Status: AC

## 2020-08-25 NOTE — Progress Notes (Signed)
Subjective:   PATIENT ID: William Villanueva GENDER: male DOB: [birth year], MRN: [id number]   HPI  Chief Complaint  Patient presents with  . Follow-up    Productive cough started a month ago    Reason for Visit: Follow-up  William Villanueva is a 70 year old male former smoker with OSA not on CPAP, chronic combined heart failure, 2nd degree heart block s/p PM who presents as a new consult for COPD management.  Synoposis: He has previously been seen by [facility] with Dr. Lamonte Sakai in 2019 for his moderately severe COPD (FEV1 68% in 2016). He has tried multiple inhalers including Stiolto, Spiriva and Anoro. He is unable to tolerate powdered inhalers due to hoarseness in the past. He does report a diagnosis of OSA but currently not using his CPAP due to having difficulty cleaning/managing device.   Today, he reports the [name] has overall helped however in the last month he was treated with antibiotics or steroids, he is not sure which one. He continues to have productive cough, shortness of breath that is 90% resolved. Minimal wheezing but reports gurgling with inspiratory breath. He believes he caught it from his grandson who he watches twice a week. He has been compliant with Breztri except for a few nights in the week.  Social History: Former smoker. Quit in 2015  I have personally reviewed patient's past medical/family/social history/allergies/current medications.  Past Medical History:  Diagnosis Date  . AV block, 2nd degree 2015   St. Jude Allure Quadra pulse generator [id number], model PM 3242  . Back pain   . Cardiomyopathy ([place])    Nonischemic 45%.   . CHF (congestive heart failure) ([place])   . Gout   . Hemochromatosis   . Hypertension   . Hypospadias [birth year]   born with  . Nephrolithiasis   . Polycythemia vera(238.4)      Allergies  Allergen Reactions  . Bupropion Hives  . Cephalexin Diarrhea and Other (See Comments)    Caused C-diff, also   . Fluoxetine Itching  .  Ibuprofen Itching  . Prednisone Itching and Other (See Comments)    Abdominal pain, also   . Temazepam Other (See Comments)    Dizziness   . Trazodone And Nefazodone Other (See Comments)    Dizziness  . Fluoxetine Hcl Itching  . Prozac [Fluoxetine Hcl] Itching     Outpatient Medications Prior to Visit  Medication Sig Dispense Refill  . albuterol (VENTOLIN HFA) 108 (90 Base) MCG/ACT inhaler Inhale 2 puffs into the lungs every 6 (six) hours as needed for wheezing or shortness of breath. 8 g 6  . allopurinol (ZYLOPRIM) 300 MG tablet Take 300 mg by mouth daily.    . BD PEN NEEDLE NANO U/F 32G X 4 MM MISC     . Budeson-Glycopyrrol-Formoterol (BREZTRI AEROSPHERE) 160-9-4.8 MCG/ACT AERO Inhale 2 puffs into the lungs 2 (two) times daily. 10.7 g 5  . carvedilol (COREG) 12.5 MG tablet Take 0.5 tablets (6.25 mg total) by mouth 2 (two) times daily with a meal. Take 12.18m in the morning and 18.745m(1 1/2) in the evening 90 tablet 3  . citalopram (CELEXA) 10 MG tablet Take 5 mg by mouth daily.     . colestipol (COLESTID) 1 g tablet Take 1 tablet by mouth in the morning, at noon, and at bedtime.    . Marland KitchenOVID-19 mRNA vaccine, Pfizer, 30 MCG/0.3ML injection INJECT AS DIRECTED .3 mL 0  . dicyclomine (BENTYL) 20 MG tablet Take 20 mg by  mouth 4 (four) times daily.    . folic acid (FOLVITE) 1 MG tablet Take 1 mg by mouth daily.    . furosemide (LASIX) 40 MG tablet Take 1 tablet (40 mg total) by mouth daily. (Patient taking differently: Take 40 mg by mouth every other day.) 90 tablet 3  . gabapentin (NEURONTIN) 300 MG capsule Take 300 mg by mouth daily.    . hydroxyurea (HYDREA) 500 MG capsule TAKE TWO CAPSULES BY MOUTH DAILY 180 capsule 5  . insulin degludec (TRESIBA FLEXTOUCH) 100 UNIT/ML SOPN FlexTouch Pen Inject 20 Units into the skin daily after breakfast.     . losartan (COZAAR) 25 MG tablet Take 1 tablet (25 mg total) by mouth daily. 90 tablet 3  . Magnesium Oxide 400 MG CAPS Take 2 capsules (800 mg  total) by mouth in the morning and at bedtime. 90 capsule 1  . methotrexate 2.5 MG tablet Take 12.5 mg by mouth See admin instructions. Take 12.5 mg by mouth in the morning and 12.5 mg in the evening ONLY ON TUESDAYS    . omeprazole (PRILOSEC) 20 MG capsule Take 20 mg by mouth 2 (two) times daily before a meal.     . oxybutynin (DITROPAN-XL) 10 MG 24 hr tablet Take 10 mg by mouth daily.    . rosuvastatin (CRESTOR) 5 MG tablet Take 5 mg by mouth daily.    [name] spironolactone (ALDACTONE) 25 MG tablet Take 0.5 tablets (12.5 mg total) by mouth daily. 45 tablet 3  . tamsulosin (FLOMAX) 0.4 MG CAPS capsule Take 0.4 mg by mouth 2 (two) times daily.     . traMADol (ULTRAM) 50 MG tablet Take 50 mg by mouth as needed.     Facility-Administered Medications Prior to Visit  Medication Dose Route Frequency Provider Last Rate Last Admin  . 0.9 %  sodium chloride infusion   Intravenous Continuous Volanda Napoleon, MD   Stopped at 05/16/13 1115    Review of Systems  Constitutional: Negative for chills, diaphoresis, fever, malaise/fatigue and weight loss.  HENT: Negative for congestion.   Respiratory: Positive for cough, shortness of breath and wheezing. Negative for hemoptysis and sputum production.   Cardiovascular: Negative for chest pain, palpitations and leg swelling.     Objective:   Vitals:   08/25/20 1020  BP: 120/78  Pulse: 85  Temp: (!) 97.5 F (36.4 C)  SpO2: 98%  Weight: 277 lb (125.6 kg)  Height: 5' 10"  (1.778 m)       Physical Exam: General: Well-appearing, no acute distress HENT: [place], AT Eyes: EOMI, no scleral icterus Respiratory: Diminished breath sounds bilaterally.  No crackles, wheezing or rales Cardiovascular: RRR, -M/R/G, no JVD Extremities:-Edema,-tenderness Neuro: AAO x4, CNII-XII grossly intact Skin: Intact, no rashes or bruising Psych: Normal mood, normal affect  Data Reviewed:  Imaging: CXR 02/04/20 - No infiltrate, effusion or edema. AICD  present  PFT: 06/04/17 FVC 3.10 (67%) FEV1 2.17 (63%) Ratio 65  TLC 86% RV/TLC 136% DLCO 48% Interpretation: Moderate obstructive defect with air trapping an reduced gas exchange consistent with emphysema. No significant bronchodilator response.  Labs: CBC    Component Value Date/Time   WBC 7.9 07/13/2020 0747   WBC 10.0 02/18/2020 0310   RBC 3.28 (L) 07/13/2020 0747   HGB 12.1 (L) 07/13/2020 0747   HGB 13.5 04/10/2017 0923   HCT 37.0 (L) 07/13/2020 0747   HCT 42.7 04/10/2017 0923   PLT 192 07/13/2020 0747   PLT 563 (H) 04/10/2017 0923   MCV 112.8 (  H) 07/13/2020 0747   MCV 96 04/10/2017 0923   MCH 36.9 (H) 07/13/2020 0747   MCHC 32.7 07/13/2020 0747   RDW 16.0 (H) 07/13/2020 0747   RDW 16.5 (H) 04/10/2017 0923   LYMPHSABS 1.2 07/13/2020 0747   LYMPHSABS 1.6 04/10/2017 0923   MONOABS 0.7 07/13/2020 0747   EOSABS 0.0 07/13/2020 0747   EOSABS 0.3 04/10/2017 0923   BASOSABS 0.1 07/13/2020 0747   BASOSABS 0.4 (H) 04/10/2017 0923   Absolute eosinophils 05/14/20 - 100   Imaging, labs and test noted above have been reviewed independently by me.     Assessment & Plan:   Discussion: 70 year old male former smoker with emphysema/COPD, OSA not on CPAP, chronic combined heart failure, 2nd degree heart block s/p PM who presents for follow-up. Symptoms improved on Breztri however did have exacerbation in 07/2020 secondary to recent sick contacts. Continues have symptoms of exacerbation. At next visit, will need to reassess his sleep apnea and discuss benefits of treatment.  Emphysema/COPD GOLD D with exacerbation - improved overall. In mild exacerbation requiring treatment --CONTINUE Breztri TWO puffs TWICE a day. Refilled today --CONTINUE Albuterol TWO puffs AS NEEDED --START prednisone 20 mg x 5 days  OSA --Discuss at next visit  Health Maintenance Immunization History  Administered Date(s) Administered  . DTaP 01/26/2016  . Influenza Split 01/21/2015  . Influenza, High Dose  Seasonal PF 02/23/2017, 03/22/2018, 01/10/2019, 03/01/2020  . Influenza,inj,Quad PF,6+ Mos 01/21/2015, 01/27/2016, 02/05/2017  . Influenza-Unspecified 01/26/2016  . PFIZER(Purple Top)SARS-COV-2 Vaccination 06/21/2019, 07/13/2019, 04/16/2020  . Pneumococcal Conjugate-13 01/27/2016, 01/09/2018  . Pneumococcal Polysaccharide-23 01/21/2015  . Pneumococcal-Unspecified 01/26/2016  . Tdap 01/26/2011, 01/27/2016, 08/09/2019  . Zoster 08/29/2019   CT Lung Screen - messaged NP regarding cost of testing per patient request. Patient declines testing until this information is available  No orders of the defined types were placed in this encounter.  Meds ordered this encounter  Medications  . Budeson-Glycopyrrol-Formoterol (BREZTRI AEROSPHERE) 160-9-4.8 MCG/ACT AERO    Sig: Inhale 2 puffs into the lungs 2 (two) times daily.    Dispense:  10.7 g    Refill:  5    Order Specific Question:   Lot Number?    Answer:   [id number]    Order Specific Question:   Expiration Date?    Answer:   11/05/2021    Order Specific Question:   Manufacturer?    Answer:   [employer] [[id number]]    Order Specific Question:   Quantity    Answer:   2    Return in about 6 months (around 02/24/2021).  [place], MD [facility] 08/25/2020 8:52 AM  Office Number [phone]

## 2020-08-25 NOTE — Patient Instructions (Addendum)
Emphysema/COPD GOLD D --CONTINUE Breztri TWO puffs TWICE a day. Refilled today --CONTINUE Albuterol TWO puffs AS NEEDED --START prednisone 20 mg x 5 days  Follow-up with me in 6 month

## 2020-08-30 DIAGNOSIS — D485 Neoplasm of uncertain behavior of skin: Secondary | ICD-10-CM | POA: Diagnosis not present

## 2020-08-30 DIAGNOSIS — B356 Tinea cruris: Secondary | ICD-10-CM | POA: Diagnosis not present

## 2020-08-30 DIAGNOSIS — L570 Actinic keratosis: Secondary | ICD-10-CM | POA: Diagnosis not present

## 2020-08-30 NOTE — Progress Notes (Signed)
HPI: FU cardiomyopathy/CHF. Echocardiogram in August of 2013 showed an ejection fraction of 35-40%. Cardiac catheterization in September of 2013 showed mild nonobstructive coronary disease. There was no hemodynamic evidence of restriction. Pulmonary capillary wedge pressure 10. Cardiac MRI in September of 2013 showed an ejection fraction of 34% with diffuse hypokinesis. There was no hyperenhancement or scar tissue and no evidence of cardiac hemochromatosis. TSH in September 2013 normal. 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 Patient subsequently had CRTP placed. Nuclear study March 2017 showed ejection fraction 32. There was prior inferior infarct but no ischemia. Treated medically as felt likely attenuation artifact. Echocardiogram February 2022 showed ejection fraction 45 to 50%, moderate left ventricular enlargement, grade 1 diastolic dysfunction.  CorVue monitoring August 23, 2020 suggested normal fluid levels.  Since last seen,patient does have dyspnea on exertion that he attributes to COPD.  No orthopnea, PND, pedal edema, exertional chest pain or syncope.  He was falling when his blood pressure was running low but this has resolved after reducing his medications.  Current Outpatient Medications  Medication Sig Dispense Refill  . albuterol (VENTOLIN HFA) 108 (90 Base) MCG/ACT inhaler Inhale 2 puffs into the lungs every 6 (six) hours as needed for wheezing or shortness of breath. 8 g 6  . allopurinol (ZYLOPRIM) 300 MG tablet Take 300 mg by mouth daily.    . BD PEN NEEDLE NANO U/F 32G X 4 MM MISC     . Budeson-Glycopyrrol-Formoterol (BREZTRI AEROSPHERE) 160-9-4.8 MCG/ACT AERO Inhale 2 puffs into the lungs 2 (two) times daily. 10.7 g 5  . Budeson-Glycopyrrol-Formoterol (BREZTRI AEROSPHERE) 160-9-4.8 MCG/ACT AERO Inhale 2 puffs into the lungs in the morning and at bedtime. 5.9 g 0  . carvedilol (COREG) 12.5 MG tablet Take 0.5 tablets (6.25 mg total) by mouth 2 (two) times  daily with a meal. Take 12.46m in the morning and 18.748m(1 1/2) in the evening 90 tablet 3  . citalopram (CELEXA) 10 MG tablet Take 5 mg by mouth daily.     . colestipol (COLESTID) 1 g tablet Take 1 tablet by mouth in the morning, at noon, and at bedtime.    . Marland KitchenOVID-19 mRNA vaccine, Pfizer, 30 MCG/0.3ML injection INJECT AS DIRECTED .3 mL 0  . dicyclomine (BENTYL) 20 MG tablet Take 20 mg by mouth 4 (four) times daily.    . folic acid (FOLVITE) 1 MG tablet Take 1 mg by mouth daily.    . Marland Kitchenabapentin (NEURONTIN) 300 MG capsule Take 300 mg by mouth daily.    . hydroxyurea (HYDREA) 500 MG capsule TAKE TWO CAPSULES BY MOUTH DAILY 180 capsule 5  . insulin degludec (TRESIBA FLEXTOUCH) 100 UNIT/ML SOPN FlexTouch Pen Inject 20 Units into the skin daily after breakfast.     . losartan (COZAAR) 25 MG tablet Take 1 tablet (25 mg total) by mouth daily. 90 tablet 3  . Magnesium Oxide 400 MG CAPS Take 2 capsules (800 mg total) by mouth in the morning and at bedtime. 90 capsule 1  . methotrexate 2.5 MG tablet Take 12.5 mg by mouth See admin instructions. Take 12.5 mg by mouth in the morning and 12.5 mg in the evening ONLY ON TUESDAYS    . omeprazole (PRILOSEC) 20 MG capsule Take 20 mg by mouth 2 (two) times daily before a meal.     . oxybutynin (DITROPAN-XL) 10 MG 24 hr tablet Take 10 mg by mouth daily.    . rosuvastatin (CRESTOR) 5 MG tablet Take 5  mg by mouth daily.    [name] spironolactone (ALDACTONE) 25 MG tablet TAKE ONE-HALF TABLET BY  MOUTH DAILY 45 tablet 3  . tamsulosin (FLOMAX) 0.4 MG CAPS capsule Take 0.4 mg by mouth 2 (two) times daily.     . traMADol (ULTRAM) 50 MG tablet Take 50 mg by mouth as needed.    . furosemide (LASIX) 40 MG tablet Take 1 tablet (40 mg total) by mouth daily. (Patient taking differently: Take 40 mg by mouth every other day.) 90 tablet 3   No current facility-administered medications for this visit.   Facility-Administered Medications Ordered in Other Visits  Medication Dose Route  Frequency Provider Last Rate Last Admin  . 0.9 %  sodium chloride infusion   Intravenous Continuous Volanda Napoleon, MD   Stopped at 05/16/13 1115     Past Medical History:  Diagnosis Date  . AV block, 2nd degree 2015   St. Jude Allure Quadra pulse generator [id number], model PM 3242  . Back pain   . Cardiomyopathy ([place])    Nonischemic 45%.   . CHF (congestive heart failure) ([place])   . Gout   . Hemochromatosis   . Hypertension   . Hypospadias [birth year]   born with  . Nephrolithiasis   . Polycythemia vera(238.4)     Past Surgical History:  Procedure Laterality Date  . ANKLE SURGERY    . BI-VENTRICULAR PACEMAKER INSERTION N/A 04/29/2014   Procedure: BI-VENTRICULAR PACEMAKER INSERTION (CRT-P);  Surgeon: Deboraha Sprang, MD; Laterality: Left  St. Jude Allure Quadra pulse generator [phone], model PM [phone]  . COLONOSCOPY N/A 02/15/2020   Procedure: COLONOSCOPY;  Surgeon: Ronnette Juniper, MD;  Location: [facility];  Service: Gastroenterology;  Laterality: N/A;  . ESOPHAGOGASTRODUODENOSCOPY (EGD) WITH PROPOFOL N/A 02/15/2020   Procedure: ESOPHAGOGASTRODUODENOSCOPY (EGD) WITH PROPOFOL;  Surgeon: Ronnette Juniper, MD;  Location: [facility];  Service: Gastroenterology;  Laterality: N/A;  . PILONIDAL CYST EXCISION    . POSTERIOR LAMINECTOMY / DECOMPRESSION LUMBAR SPINE    . TONSILLECTOMY      Social History   Socioeconomic History  . Marital status: Married    Spouse name: Not on file  . Number of children: Not on file  . Years of education: Not on file  . Highest education level: Not on file  Occupational History  . Not on file  Tobacco Use  . Smoking status: Former Smoker    Packs/day: 1.00    Years: 44.00    Pack years: 44.00    Types: Cigarettes    Start date: 06/05/1968    Quit date: 04/05/2013    Years since quitting: 7.4  . Smokeless tobacco: Never Used  . Tobacco comment: quit in 2014  Vaping Use  . Vaping Use: Never used  Substance and Sexual Activity  . Alcohol use:  Yes    Alcohol/week: 0.0 standard drinks    Comment: rare  . Drug use: No  . Sexual activity: Not Currently  Other Topics Concern  . Not on file  Social History Narrative   Lives at home with wife in a one story home.  Has no children.  Does not work.  Getting workman's comp.  Education: 4 years trade school.    Social Determinants of Health   Financial Resource Strain: Not on file  Food Insecurity: Not on file  Transportation Needs: Not on file  Physical Activity: Not on file  Stress: Not on file  Social Connections: Not on file  Intimate Partner Violence: Not on file  Family History  Problem Relation Age of Onset  . Heart disease Maternal Grandmother        Pacemaker, MI  . Stroke Mother   . Other Father        Deceased, car fell on him  . Diabetes Sister   . Hypertension Sister     ROS: no fevers or chills, productive cough, hemoptysis, dysphasia, odynophagia, melena, hematochezia, dysuria, hematuria, rash, seizure activity, orthopnea, PND, pedal edema, claudication. Remaining systems are negative.  Physical Exam: Well-developed well-nourished in no acute distress.  Skin is warm and dry.  HEENT is normal.  Neck is supple.  Chest is clear to auscultation with normal expansion.  Cardiovascular exam is regular rate and rhythm.  Abdominal exam nontender or distended. No masses palpated. Extremities show no edema. neuro grossly intact   A/P  1 nonischemic cardiomyopathy-plan to continue carvedilol at present dose.  He has had difficulties with low blood pressure and orthostasis and therefore we have discontinued his Entresto and resume low-dose losartan.  Carvedilol dose was also recently reduced.  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  We will continue diuretic at present dose.   3 hypertension-patient's blood pressure is controlled.  Continue present medical regimen.  4 CRT-P-Per  electrophysiology.  5 morbid obesity-continue efforts at weight loss.  Kirk Ruths, MD

## 2020-09-02 ENCOUNTER — Other Ambulatory Visit: Payer: Self-pay | Admitting: Cardiology

## 2020-09-02 DIAGNOSIS — R0602 Shortness of breath: Secondary | ICD-10-CM

## 2020-09-03 ENCOUNTER — Encounter: Payer: Self-pay | Admitting: Pulmonary Disease

## 2020-09-08 ENCOUNTER — Encounter: Payer: Self-pay | Admitting: Cardiology

## 2020-09-08 ENCOUNTER — Ambulatory Visit: Payer: Medicare Other | Admitting: Cardiology

## 2020-09-08 ENCOUNTER — Other Ambulatory Visit: Payer: Self-pay

## 2020-09-08 VITALS — BP 128/74 | HR 88 | Ht 70.0 in | Wt 274.0 lb

## 2020-09-08 DIAGNOSIS — I5042 Chronic combined systolic (congestive) and diastolic (congestive) heart failure: Secondary | ICD-10-CM

## 2020-09-08 DIAGNOSIS — Z950 Presence of cardiac pacemaker: Secondary | ICD-10-CM

## 2020-09-08 DIAGNOSIS — I428 Other cardiomyopathies: Secondary | ICD-10-CM | POA: Diagnosis not present

## 2020-09-08 NOTE — Patient Instructions (Signed)

## 2020-09-10 ENCOUNTER — Ambulatory Visit: Payer: Medicare Other | Attending: Internal Medicine

## 2020-09-10 DIAGNOSIS — E7800 Pure hypercholesterolemia, unspecified: Secondary | ICD-10-CM | POA: Diagnosis not present

## 2020-09-10 DIAGNOSIS — Z1389 Encounter for screening for other disorder: Secondary | ICD-10-CM | POA: Diagnosis not present

## 2020-09-10 DIAGNOSIS — Z0000 Encounter for general adult medical examination without abnormal findings: Secondary | ICD-10-CM | POA: Diagnosis not present

## 2020-09-10 DIAGNOSIS — J449 Chronic obstructive pulmonary disease, unspecified: Secondary | ICD-10-CM | POA: Diagnosis not present

## 2020-09-10 DIAGNOSIS — Z23 Encounter for immunization: Secondary | ICD-10-CM

## 2020-09-10 DIAGNOSIS — N183 Chronic kidney disease, stage 3 unspecified: Secondary | ICD-10-CM | POA: Diagnosis not present

## 2020-09-10 DIAGNOSIS — E1122 Type 2 diabetes mellitus with diabetic chronic kidney disease: Secondary | ICD-10-CM | POA: Diagnosis not present

## 2020-09-10 DIAGNOSIS — K5190 Ulcerative colitis, unspecified, without complications: Secondary | ICD-10-CM | POA: Diagnosis not present

## 2020-09-10 DIAGNOSIS — Z794 Long term (current) use of insulin: Secondary | ICD-10-CM | POA: Diagnosis not present

## 2020-09-10 DIAGNOSIS — M109 Gout, unspecified: Secondary | ICD-10-CM | POA: Diagnosis not present

## 2020-09-10 NOTE — Progress Notes (Signed)
   Covid-19 Vaccination Clinic  Name:  William Villanueva    MRN: [id number] DOB: 13-Oct-1950  09/10/2020  [name] was observed post Covid-19 immunization for 15 minutes without incident. He was provided with Vaccine Information Sheet and instruction to access the V-Safe system.   [name] was instructed to call 911 with any severe reactions post vaccine: [name] Difficulty breathing  . Swelling of face and throat  . A fast heartbeat  . A bad rash all over body  . Dizziness and weakness   Immunizations Administered    Name Date Dose VIS Date Route   PFIZER Comrnaty(Gray TOP) Covid-19 Vaccine 09/10/2020 12:31 PM 0.3 mL 04/15/2020 Intramuscular   Manufacturer: [place]   Lot: [id number]   NDC: [phone]

## 2020-09-13 ENCOUNTER — Encounter: Payer: Self-pay | Admitting: Hematology & Oncology

## 2020-09-13 ENCOUNTER — Other Ambulatory Visit: Payer: Self-pay

## 2020-09-13 ENCOUNTER — Inpatient Hospital Stay (HOSPITAL_BASED_OUTPATIENT_CLINIC_OR_DEPARTMENT_OTHER): Payer: Medicare Other | Admitting: Hematology & Oncology

## 2020-09-13 ENCOUNTER — Inpatient Hospital Stay: Payer: Medicare Other | Attending: Hematology & Oncology

## 2020-09-13 VITALS — BP 123/56 | HR 80 | Temp 97.7°F | Resp 22 | Wt 273.0 lb

## 2020-09-13 DIAGNOSIS — D45 Polycythemia vera: Secondary | ICD-10-CM | POA: Diagnosis not present

## 2020-09-13 DIAGNOSIS — I441 Atrioventricular block, second degree: Secondary | ICD-10-CM | POA: Insufficient documentation

## 2020-09-13 DIAGNOSIS — S63502S Unspecified sprain of left wrist, sequela: Secondary | ICD-10-CM

## 2020-09-13 DIAGNOSIS — Z7982 Long term (current) use of aspirin: Secondary | ICD-10-CM | POA: Diagnosis not present

## 2020-09-13 LAB — CMP (CANCER CENTER ONLY)
ALT: 12 U/L (ref 0–44)
AST: 14 U/L — ABNORMAL LOW (ref 15–41)
Albumin: 4 g/dL (ref 3.5–5.0)
Alkaline Phosphatase: 57 U/L (ref 38–126)
Anion gap: 8 (ref 5–15)
BUN: 17 mg/dL (ref 8–23)
CO2: 25 mmol/L (ref 22–32)
Calcium: 9.1 mg/dL (ref 8.9–10.3)
Chloride: 105 mmol/L (ref 98–111)
Creatinine: 1.24 mg/dL (ref 0.61–1.24)
GFR, Estimated: >60 mL/min (ref >60)
Glucose, Bld: 136 mg/dL — ABNORMAL HIGH (ref 70–99)
Potassium: 4.1 mmol/L (ref 3.5–5.1)
Sodium: 138 mmol/L (ref 135–145)
Total Bilirubin: 0.7 mg/dL (ref 0.3–1.2)
Total Protein: 6.6 g/dL (ref 6.5–8.1)

## 2020-09-13 LAB — IRON AND TIBC
Iron: 33 ug/dL — ABNORMAL LOW (ref 42–163)
Saturation Ratios: 8 % — ABNORMAL LOW (ref 20–55)
TIBC: 429 ug/dL — ABNORMAL HIGH (ref 202–409)
UIBC: 395 ug/dL — ABNORMAL HIGH (ref 117–376)

## 2020-09-13 LAB — CBC WITH DIFFERENTIAL (CANCER CENTER ONLY)
Abs Immature Granulocytes: 0.06 10*3/uL (ref 0.00–0.07)
Basophils Absolute: 0.2 10*3/uL — ABNORMAL HIGH (ref 0.0–0.1)
Basophils Relative: 3 %
Eosinophils Absolute: 0.1 10*3/uL (ref 0.0–0.5)
Eosinophils Relative: 1 %
HCT: 39.5 % (ref 39.0–52.0)
Hemoglobin: 12.6 g/dL — ABNORMAL LOW (ref 13.0–17.0)
Immature Granulocytes: 1 %
Lymphocytes Relative: 17 %
Lymphs Abs: 1.3 10*3/uL (ref 0.7–4.0)
MCH: 31.8 pg (ref 26.0–34.0)
MCHC: 31.9 g/dL (ref 30.0–36.0)
MCV: 99.7 fL (ref 80.0–100.0)
Monocytes Absolute: 0.6 10*3/uL (ref 0.1–1.0)
Monocytes Relative: 8 %
Neutro Abs: 5.4 10*3/uL (ref 1.7–7.7)
Neutrophils Relative %: 70 %
Platelet Count: 270 10*3/uL (ref 150–400)
RBC: 3.96 MIL/uL — ABNORMAL LOW (ref 4.22–5.81)
RDW: 15.6 % — ABNORMAL HIGH (ref 11.5–15.5)
WBC Count: 7.6 10*3/uL (ref 4.0–10.5)
nRBC: 0 % (ref 0.0–0.2)

## 2020-09-13 LAB — MAGNESIUM: Magnesium: 1.6 mg/dL — ABNORMAL LOW (ref 1.7–2.4)

## 2020-09-13 LAB — LACTATE DEHYDROGENASE: LDH: 215 U/L — ABNORMAL HIGH (ref 98–192)

## 2020-09-13 LAB — FERRITIN: Ferritin: 13 ng/mL — ABNORMAL LOW (ref 24–336)

## 2020-09-13 NOTE — Progress Notes (Signed)
Hematology and Oncology Follow Up Visit  William Villanueva [id number] 05/13/1950 70 y.o. 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  William Villanueva is back for followup.  He is doing pretty well.  He is now off Entresto.  Apparently, his blood pressure was little bit on the lower side.  He had a wonderful Mother's Day weekend.  He has had no cardiac issues.  He has had no problems with the pacemaker.  He has had no palpitations.  There has been no problems with his bowels.  He has had no problems with diarrhea.  There has been no leg swelling.  He has had no rashes.  There has been no fever.  His last iron studies back in March showed a ferritin of 107 with iron saturation of 19%.  The big news is that he is going up to [place] this weekend for a 50th high school reunion.  I am so happy for him.  I am sure that he will have a wonderful time.  There has been no issues with headache.  His back seems to be holding on right now.  He does have chronic back issues.  Overall, his performance status is ECOG 1.    Medications:  Current Outpatient Medications:  .  Insulin Glargine (BASAGLAR KWIKPEN) 100 UNIT/ML, See admin instructions., Disp: , Rfl:  .  albuterol (VENTOLIN HFA) 108 (90 Base) MCG/ACT inhaler, Inhale 2 puffs into the lungs every 6 (six) hours as needed for wheezing or shortness of breath., Disp: 8 g, Rfl: 6 .  allopurinol (ZYLOPRIM) 300 MG tablet, Take 300 mg by mouth daily., Disp: , Rfl:  .  BD PEN NEEDLE NANO U/F 32G X 4 MM MISC, , Disp: , Rfl:  .  Budeson-Glycopyrrol-Formoterol (BREZTRI AEROSPHERE) 160-9-4.8 MCG/ACT AERO, Inhale 2 puffs into the lungs 2 (two) times daily., Disp: 10.7 g, Rfl: 5 .  carvedilol (COREG) 12.5 MG tablet, Take 0.5 tablets  (6.25 mg total) by mouth 2 (two) times daily with a meal. Take 12.21m in the morning and 18.746m(1 1/2) in the evening, Disp: 90 tablet, Rfl: 3 .  citalopram (CELEXA) 10 MG tablet, Take 5 mg by mouth daily. , Disp: , Rfl:  .  colestipol (COLESTID) 1 g tablet, Take 1 tablet by mouth in the morning, at noon, and at bedtime., Disp: , Rfl:  .  COVID-19 mRNA vaccine, Pfizer, 30 MCG/0.3ML injection, INJECT AS DIRECTED, Disp: .3 mL, Rfl: 0 .  dicyclomine (BENTYL) 20 MG tablet, Take 20 mg by mouth 4 (four) times daily., Disp: , Rfl:  .  folic acid (FOLVITE) 1 MG tablet, Take 1 mg by mouth daily., Disp: , Rfl:  .  furosemide (LASIX) 40 MG tablet, Take 1 tablet (40 mg total) by mouth daily. (Patient taking differently: Take 40 mg by mouth every other day.), Disp: 90 tablet, Rfl: 3 .  gabapentin (NEURONTIN) 300 MG capsule, Take 300 mg by mouth daily., Disp: , Rfl:  .  hydroxyurea (HYDREA) 500 MG capsule, TAKE TWO CAPSULES BY MOUTH DAILY, Disp: 180 capsule, Rfl: 5 .  insulin degludec (TRESIBA FLEXTOUCH) 100 UNIT/ML SOPN FlexTouch Pen, Inject 20 Units into the skin daily after breakfast. , Disp: , Rfl:  .  irbesartan (AVAPRO) 75 MG tablet, Take 1  tablet by mouth daily., Disp: , Rfl:  .  losartan (COZAAR) 25 MG tablet, Take 1 tablet (25 mg total) by mouth daily., Disp: 90 tablet, Rfl: 3 .  Magnesium Oxide 400 MG CAPS, Take 2 capsules (800 mg total) by mouth in the morning and at bedtime., Disp: 90 capsule, Rfl: 1 .  methotrexate 2.5 MG tablet, Take 12.5 mg by mouth See admin instructions. Take 12.5 mg by mouth in the morning and 12.5 mg in the evening ONLY ON TUESDAYS, Disp: , Rfl:  .  omeprazole (PRILOSEC) 20 MG capsule, Take 20 mg by mouth 2 (two) times daily before a meal. , Disp: , Rfl:  .  oxybutynin (DITROPAN-XL) 10 MG 24 hr tablet, Take 10 mg by mouth daily., Disp: , Rfl:  .  rosuvastatin (CRESTOR) 5 MG tablet, Take 5 mg by mouth daily., Disp: , Rfl:  .  spironolactone (ALDACTONE) 25 MG tablet, TAKE  ONE-HALF TABLET BY  MOUTH DAILY, Disp: 45 tablet, Rfl: 3 .  tamsulosin (FLOMAX) 0.4 MG CAPS capsule, Take 0.4 mg by mouth 2 (two) times daily. , Disp: , Rfl:  .  traMADol (ULTRAM) 50 MG tablet, Take 50 mg by mouth as needed., Disp: , Rfl:  No current facility-administered medications for this visit.  Facility-Administered Medications Ordered in Other Visits:  .  0.9 %  sodium chloride infusion, , Intravenous, Continuous, Nena Hampe, Rudell Cobb, MD, Stopped at 05/16/13 1115  Allergies:  Allergies  Allergen Reactions  . Bupropion Hives and Other (See Comments)  . Cephalexin Diarrhea and Other (See Comments)    Caused C-diff, also   . Fluoxetine Itching  . Ibuprofen Itching  . Prednisone Itching and Other (See Comments)    Abdominal pain, also   . Temazepam Other (See Comments)    Dizziness   . Trazodone And Nefazodone Other (See Comments)    Dizziness  . Fluoxetine Hcl Itching  . Prozac [Fluoxetine Hcl] Itching    Past Medical History, Surgical history, Social history, and Family History were reviewed and updated.  Review of Systems: Review of Systems  Constitutional: Negative.   HENT: Negative.   Eyes: Negative.   Respiratory: Positive for shortness of breath.   Cardiovascular: Positive for chest pain.  Gastrointestinal: Negative.   Genitourinary: Negative.   Musculoskeletal: Positive for back pain.  Skin: Negative.   Neurological: Negative.   Endo/Heme/Allergies: Negative.   Psychiatric/Behavioral: Negative.     Physical Exam:  weight is 273 lb (123.8 kg). His oral temperature is 97.7 F (36.5 C). His blood pressure is 123/56 (abnormal) and his pulse is 80. His respiration is 22 (abnormal) and oxygen saturation is 98%.   Physical Exam Vitals reviewed.  HENT:     Head: Normocephalic and atraumatic.  Eyes:     Pupils: Pupils are equal, round, and reactive to light.  Cardiovascular:     Rate and Rhythm: Normal rate and regular rhythm.     Heart sounds: Normal heart  sounds.  Pulmonary:     Effort: Pulmonary effort is normal.     Breath sounds: Normal breath sounds.  Abdominal:     General: Bowel sounds are normal.     Palpations: Abdomen is soft.  Musculoskeletal:        General: No tenderness or deformity. Normal range of motion.     Cervical back: Normal range of motion.  Lymphadenopathy:     Cervical: No cervical adenopathy.  Skin:    General: Skin is warm and dry.     Findings:  No erythema or rash.  Neurological:     Mental Status: He is alert and oriented to person, place, and time.  Psychiatric:        Behavior: Behavior normal.        Thought Content: Thought content normal.        Judgment: Judgment normal.      Lab Results  Component Value Date   WBC 7.6 09/13/2020   HGB 12.6 (L) 09/13/2020   HCT 39.5 09/13/2020   MCV 99.7 09/13/2020   PLT 270 09/13/2020     Chemistry      Component Value Date/Time   NA 135 07/13/2020 0747   NA 140 05/10/2020 0905   NA 141 04/10/2017 0923   NA 140 04/05/2016 0739   K 4.5 07/13/2020 0747   K 3.5 04/10/2017 0923   K 4.2 04/05/2016 0739   CL 105 07/13/2020 0747   CL 108 04/10/2017 0923   CO2 21 (L) 07/13/2020 0747   CO2 24 04/10/2017 0923   CO2 20 (L) 04/05/2016 0739   BUN 24 (H) 07/13/2020 0747   BUN 14 05/10/2020 0905   BUN 12 04/10/2017 0923   BUN 14.7 04/05/2016 0739   CREATININE 1.44 (H) 07/13/2020 0747   CREATININE 1.1 04/10/2017 0923   CREATININE 1.1 04/05/2016 0739      Component Value Date/Time   CALCIUM 9.0 07/13/2020 0747   CALCIUM 8.4 04/10/2017 0923   CALCIUM 9.0 04/05/2016 0739   ALKPHOS 67 07/13/2020 0747   ALKPHOS 77 04/10/2017 0923   ALKPHOS 91 04/05/2016 0739   AST 12 (L) 07/13/2020 0747   AST 34 04/05/2016 0739   ALT 9 07/13/2020 0747   ALT 32 04/10/2017 0923   ALT 40 04/05/2016 0739   BILITOT 0.4 07/13/2020 0747   BILITOT 0.48 04/05/2016 0739      Impression and Plan: Mr. Zinn is 70 year old gentleman with polycythemia vera.  He is on Hydrea.   He is doing well with the Hydrea.  We have not had phlebotomize him for many months.  I am glad that he will be going up to [facility] for the high school reunion.  I know that he always enjoyed going after visit family and friends.  We will still plan for a 67-monthfollow-up.  I think this is a good interval for uK[place]    PVolanda Napoleon MD 5/9/20228:14 AM

## 2020-09-16 ENCOUNTER — Ambulatory Visit (INDEPENDENT_AMBULATORY_CARE_PROVIDER_SITE_OTHER): Payer: Medicare Other

## 2020-09-16 DIAGNOSIS — I428 Other cardiomyopathies: Secondary | ICD-10-CM | POA: Diagnosis not present

## 2020-09-16 LAB — CUP PACEART REMOTE DEVICE CHECK
Battery Remaining Longevity: 42 mo
Battery Remaining Percentage: 50 %
Battery Voltage: 2.87 V
Brady Statistic AP VP Percent: 6.6 %
Brady Statistic AP VS Percent: 1 %
Brady Statistic AS VP Percent: 90 %
Brady Statistic AS VS Percent: 1 %
Brady Statistic RA Percent Paced: 4.4 %
Date Time Interrogation Session: 20220512020013
Implantable Lead Implant Date: 20151223
Implantable Lead Implant Date: 20151223
Implantable Lead Implant Date: 20151223
Implantable Lead Location: 753858
Implantable Lead Location: 753859
Implantable Lead Location: 753860
Implantable Pulse Generator Implant Date: 20151223
Lead Channel Impedance Value: 430 Ohm
Lead Channel Impedance Value: 430 Ohm
Lead Channel Impedance Value: 730 Ohm
Lead Channel Pacing Threshold Amplitude: 1.125 V
Lead Channel Pacing Threshold Amplitude: 1.25 V
Lead Channel Pacing Threshold Amplitude: 1.5 V
Lead Channel Pacing Threshold Pulse Width: 0.4 ms
Lead Channel Pacing Threshold Pulse Width: 0.4 ms
Lead Channel Pacing Threshold Pulse Width: 0.7 ms
Lead Channel Sensing Intrinsic Amplitude: 2.6 mV
Lead Channel Sensing Intrinsic Amplitude: 7.9 mV
Lead Channel Setting Pacing Amplitude: 2.125
Lead Channel Setting Pacing Amplitude: 2.5 V
Lead Channel Setting Pacing Amplitude: 2.5 V
Lead Channel Setting Pacing Pulse Width: 0.4 ms
Lead Channel Setting Pacing Pulse Width: 0.7 ms
Lead Channel Setting Sensing Sensitivity: 2 mV
Pulse Gen Model: 3242
Pulse Gen Serial Number: 7701275

## 2020-09-17 ENCOUNTER — Other Ambulatory Visit (HOSPITAL_BASED_OUTPATIENT_CLINIC_OR_DEPARTMENT_OTHER): Payer: Self-pay

## 2020-09-17 MED ORDER — PFIZER-BIONT COVID-19 VAC-TRIS 30 MCG/0.3ML IM SUSP
INTRAMUSCULAR | 0 refills | Status: DC
  Filled 2020-09-17: qty 0.3, 1d supply, fill #0

## 2020-09-28 ENCOUNTER — Ambulatory Visit (INDEPENDENT_AMBULATORY_CARE_PROVIDER_SITE_OTHER): Payer: Medicare Other

## 2020-09-28 DIAGNOSIS — I5042 Chronic combined systolic (congestive) and diastolic (congestive) heart failure: Secondary | ICD-10-CM | POA: Diagnosis not present

## 2020-09-28 DIAGNOSIS — Z950 Presence of cardiac pacemaker: Secondary | ICD-10-CM

## 2020-09-29 NOTE — Progress Notes (Signed)
EPIC Encounter for ICM Monitoring  Patient Name: William Villanueva is a 70 y.o. male Date: 09/29/2020 Primary Care Physican: Antony Contras, MD Primary Cardiologist:Crenshaw Electrophysiologist:Klein Bi-V Pacing:96% 09/29/2020 Weight: 272lbs   Spoke with patient and reports feeling well at this time. Heart failure questions reviewed. Pt asymptomatic for fluid.  CorVue thoracic impedancesuggestingnormal fluid levels.  Prescribed:   Furosemide40 mg take 1 tablet daily.  Patient takes differently: takingFurosemide every other day.   Spironolactone 25 mg take 0.5 tablet (12.5 mg total) once a day  Labs: 09/13/2020 Creatinine 1.24, BUN 17, Potassium 4.1, Sodium 138, GFR >60 07/13/2020 Creatinine1.44, BUN24, Potassium4.5, Sodium135, GFR53 05/14/2020 Creatinine1.02, BUN18, Potassium4.2, Sodium141, GFR>60 A complete set of results can be found in Results Review.  Recommendations: Advised to limit salt intake. No changes and encouraged to call if experiencing any fluid symptoms.  Follow-up plan: ICM clinic phone appointment on6/27/2022. 91 day device clinic remote transmission8/03/2021.   EP/Cardiology Office Visits:09/08/2020 with Dr Stanford Breed.  Copy of ICM check sent to [place].   3 month ICM trend: 09/27/2020.    1 Year ICM trend:       Rosalene Billings, RN 09/29/2020 11:02 AM

## 2020-10-04 ENCOUNTER — Other Ambulatory Visit: Payer: Self-pay | Admitting: Cardiology

## 2020-10-04 DIAGNOSIS — I429 Cardiomyopathy, unspecified: Secondary | ICD-10-CM

## 2020-10-08 NOTE — Progress Notes (Signed)
Remote pacemaker transmission.   

## 2020-10-11 ENCOUNTER — Other Ambulatory Visit: Payer: Self-pay | Admitting: *Deleted

## 2020-10-11 DIAGNOSIS — Z87891 Personal history of nicotine dependence: Secondary | ICD-10-CM

## 2020-10-20 DIAGNOSIS — I119 Hypertensive heart disease without heart failure: Secondary | ICD-10-CM | POA: Diagnosis not present

## 2020-10-20 DIAGNOSIS — K219 Gastro-esophageal reflux disease without esophagitis: Secondary | ICD-10-CM | POA: Diagnosis not present

## 2020-10-20 DIAGNOSIS — J449 Chronic obstructive pulmonary disease, unspecified: Secondary | ICD-10-CM | POA: Diagnosis not present

## 2020-10-20 DIAGNOSIS — I5043 Acute on chronic combined systolic (congestive) and diastolic (congestive) heart failure: Secondary | ICD-10-CM | POA: Diagnosis not present

## 2020-10-20 DIAGNOSIS — N183 Chronic kidney disease, stage 3 unspecified: Secondary | ICD-10-CM | POA: Diagnosis not present

## 2020-10-20 DIAGNOSIS — E7800 Pure hypercholesterolemia, unspecified: Secondary | ICD-10-CM | POA: Diagnosis not present

## 2020-10-20 DIAGNOSIS — M069 Rheumatoid arthritis, unspecified: Secondary | ICD-10-CM | POA: Diagnosis not present

## 2020-10-20 DIAGNOSIS — E1122 Type 2 diabetes mellitus with diabetic chronic kidney disease: Secondary | ICD-10-CM | POA: Diagnosis not present

## 2020-10-20 DIAGNOSIS — D631 Anemia in chronic kidney disease: Secondary | ICD-10-CM | POA: Diagnosis not present

## 2020-10-20 DIAGNOSIS — I10 Essential (primary) hypertension: Secondary | ICD-10-CM | POA: Diagnosis not present

## 2020-11-01 ENCOUNTER — Ambulatory Visit (INDEPENDENT_AMBULATORY_CARE_PROVIDER_SITE_OTHER): Payer: Medicare Other

## 2020-11-01 DIAGNOSIS — I5042 Chronic combined systolic (congestive) and diastolic (congestive) heart failure: Secondary | ICD-10-CM | POA: Diagnosis not present

## 2020-11-01 DIAGNOSIS — Z950 Presence of cardiac pacemaker: Secondary | ICD-10-CM | POA: Diagnosis not present

## 2020-11-02 NOTE — Progress Notes (Signed)
EPIC Encounter for ICM Monitoring  Patient Name: William Villanueva is a 70 y.o. male Date: 11/02/2020 Primary Care Physican: Antony Contras, MD Primary Cardiologist: Stanford Breed Electrophysiologist: Vergie Living Pacing: 96%            11/02/2020 Weight: 265 lbs                                                            Spoke with patient and reports feeling well at this time. Heart failure questions reviewed. Pt asymptomatic for fluid.  He is working on losing weight and goal weight is 250 lbs.   He reports having chest pain a couple times in the last month.  The pain last a couple minutes and then goes away.  No other symptoms associated with the pain and not associated with any type of movement.                CorVue thoracic impedance suggesting normal fluid levels.     Prescribed:   Furosemide 40 mg take 1 tablet daily.   Patient takes differently: taking Furosemide every other day. Spironolactone 25 mg take 0.5 tablet (12.5 mg total) once a day   Labs: 09/13/2020 Creatinine 1.24, BUN 17, Potassium 4.1, Sodium 138, GFR >60 07/13/2020 Creatinine 1.44, BUN 24, Potassium 4.5, Sodium 135, GFR 53 05/14/2020 Creatinine 1.02, BUN 18, Potassium 4.2, Sodium 141, GFR >60  A complete set of results can be found in Results Review.   Recommendations:  Advised to call Dr Jacalyn Lefevre office for an appointment to be evaluated if chest pain returns and he does not feel it is urgent.  Also advised to call 911 if needed for changes in condition.    Follow-up plan: ICM clinic phone appointment on 12/06/2020.   91 day device clinic remote transmission 12/16/2020.     EP/Cardiology Office Visits:  09/08/2020 with Dr Stanford Breed.   Copy of ICM check sent to Dr. Caryl Comes.     3 month ICM trend: 11/01/2020.    1 Year ICM trend:       Rosalene Billings, RN 11/02/2020 2:26 PM

## 2020-11-11 ENCOUNTER — Telehealth: Payer: Self-pay | Admitting: Internal Medicine

## 2020-11-11 NOTE — Telephone Encounter (Signed)
Pt c/o medication issue:  1. Name of Medication: losartan (COZAAR) 25 MG tablet  2. How are you currently taking this medication (dosage and times per day)? Not currently taking  3. Are you having a reaction (difficulty breathing--STAT)? no  4. What is your medication issue? Katie from [facility] calling to clarify if the patient needs to be taking medication. Phone: [phone]

## 2020-11-11 NOTE — Telephone Encounter (Signed)
Spoke with William Villanueva from [facility] and advised per Dr Olin Pia note on 08/03/2020 pt was to stop Entresto and begin Losartan 59m - 1 tablet by mouth daily.  Katie verbalized understanding and thanked RTherapist, sportsfor the call.

## 2020-11-12 ENCOUNTER — Telehealth: Payer: Self-pay | Admitting: Internal Medicine

## 2020-11-12 MED ORDER — LOSARTAN POTASSIUM 25 MG PO TABS: 25.0000 mg | ORAL_TABLET | Freq: Every day | ORAL | 3 refills | Status: DC

## 2020-11-12 NOTE — Telephone Encounter (Signed)
Refill for Losartan sent to [facility].

## 2020-11-12 NOTE — Telephone Encounter (Signed)
*  STAT* If patient is at the pharmacy, call can be transferred to refill team.   1. Which medications need to be refilled? (please list name of each medication and dose if known)  losartan (COZAAR) 25 MG tablet  2. Which pharmacy/location (including street and city if local pharmacy) is medication to be sent to? [facility] [id number] - [place], [place] - [address]  3. Do they need a 30 day or 90 day supply? 75  Katie from [facility] called on behalf of the patient to get the medication refilled. She is following up from her call from 11/11/20

## 2020-11-15 ENCOUNTER — Inpatient Hospital Stay (HOSPITAL_BASED_OUTPATIENT_CLINIC_OR_DEPARTMENT_OTHER): Payer: Medicare Other | Admitting: Hematology & Oncology

## 2020-11-15 ENCOUNTER — Telehealth: Payer: Self-pay

## 2020-11-15 ENCOUNTER — Other Ambulatory Visit: Payer: Self-pay

## 2020-11-15 ENCOUNTER — Encounter: Payer: Self-pay | Admitting: Hematology & Oncology

## 2020-11-15 ENCOUNTER — Inpatient Hospital Stay: Payer: Medicare Other | Attending: Hematology & Oncology

## 2020-11-15 VITALS — BP 133/57 | HR 76 | Temp 98.1°F | Resp 20 | Wt 270.0 lb

## 2020-11-15 DIAGNOSIS — H52203 Unspecified astigmatism, bilateral: Secondary | ICD-10-CM | POA: Diagnosis not present

## 2020-11-15 DIAGNOSIS — I441 Atrioventricular block, second degree: Secondary | ICD-10-CM | POA: Insufficient documentation

## 2020-11-15 DIAGNOSIS — H532 Diplopia: Secondary | ICD-10-CM | POA: Diagnosis not present

## 2020-11-15 DIAGNOSIS — D45 Polycythemia vera: Secondary | ICD-10-CM | POA: Insufficient documentation

## 2020-11-15 DIAGNOSIS — Z7982 Long term (current) use of aspirin: Secondary | ICD-10-CM | POA: Insufficient documentation

## 2020-11-15 DIAGNOSIS — E119 Type 2 diabetes mellitus without complications: Secondary | ICD-10-CM | POA: Diagnosis not present

## 2020-11-15 DIAGNOSIS — H5203 Hypermetropia, bilateral: Secondary | ICD-10-CM | POA: Diagnosis not present

## 2020-11-15 LAB — CBC WITH DIFFERENTIAL (CANCER CENTER ONLY)
Abs Immature Granulocytes: 0.15 10*3/uL — ABNORMAL HIGH (ref 0.00–0.07)
Basophils Absolute: 0.3 10*3/uL — ABNORMAL HIGH (ref 0.0–0.1)
Basophils Relative: 2 %
Eosinophils Absolute: 0.1 10*3/uL (ref 0.0–0.5)
Eosinophils Relative: 1 %
HCT: 43.5 % (ref 39.0–52.0)
Hemoglobin: 13.2 g/dL (ref 13.0–17.0)
Immature Granulocytes: 1 %
Lymphocytes Relative: 12 %
Lymphs Abs: 1.4 10*3/uL (ref 0.7–4.0)
MCH: 28.9 pg (ref 26.0–34.0)
MCHC: 30.3 g/dL (ref 30.0–36.0)
MCV: 95.2 fL (ref 80.0–100.0)
Monocytes Absolute: 0.6 10*3/uL (ref 0.1–1.0)
Monocytes Relative: 5 %
Neutro Abs: 8.8 10*3/uL — ABNORMAL HIGH (ref 1.7–7.7)
Neutrophils Relative %: 79 %
Platelet Count: 419 10*3/uL — ABNORMAL HIGH (ref 150–400)
RBC: 4.57 MIL/uL (ref 4.22–5.81)
RDW: 18.1 % — ABNORMAL HIGH (ref 11.5–15.5)
WBC Count: 11.2 10*3/uL — ABNORMAL HIGH (ref 4.0–10.5)
nRBC: 0.3 % — ABNORMAL HIGH (ref 0.0–0.2)

## 2020-11-15 LAB — CMP (CANCER CENTER ONLY)
ALT: 11 U/L (ref 0–44)
AST: 15 U/L (ref 15–41)
Albumin: 4.1 g/dL (ref 3.5–5.0)
Alkaline Phosphatase: 64 U/L (ref 38–126)
Anion gap: 8 (ref 5–15)
BUN: 23 mg/dL (ref 8–23)
CO2: 23 mmol/L (ref 22–32)
Calcium: 9.1 mg/dL (ref 8.9–10.3)
Chloride: 106 mmol/L (ref 98–111)
Creatinine: 1.3 mg/dL — ABNORMAL HIGH (ref 0.61–1.24)
GFR, Estimated: 59 mL/min — ABNORMAL LOW (ref >60)
Glucose, Bld: 119 mg/dL — ABNORMAL HIGH (ref 70–99)
Potassium: 4.4 mmol/L (ref 3.5–5.1)
Sodium: 137 mmol/L (ref 135–145)
Total Bilirubin: 0.5 mg/dL (ref 0.3–1.2)
Total Protein: 6.6 g/dL (ref 6.5–8.1)

## 2020-11-15 LAB — IRON AND TIBC
Iron: 46 ug/dL (ref 42–163)
Saturation Ratios: 12 % — ABNORMAL LOW (ref 20–55)
TIBC: 395 ug/dL (ref 202–409)
UIBC: 350 ug/dL (ref 117–376)

## 2020-11-15 LAB — MAGNESIUM: Magnesium: 1.8 mg/dL (ref 1.7–2.4)

## 2020-11-15 LAB — LACTATE DEHYDROGENASE: LDH: 285 U/L — ABNORMAL HIGH (ref 98–192)

## 2020-11-15 LAB — FERRITIN: Ferritin: 12 ng/mL — ABNORMAL LOW (ref 24–336)

## 2020-11-15 NOTE — Telephone Encounter (Signed)
Appts made and printed for pt per 11/15/20 los   William Villanueva

## 2020-11-15 NOTE — Progress Notes (Signed)
Hematology and Oncology Follow Up Visit  William Villanueva [id number] 1950-11-26 70 y.o. 11/15/2020   Principle Diagnosis:  Polycythemia vera-JAK2 positive Heart block-Mobitz II  Current Therapy:   Hydrea 500 mg p.o.daily -dose changed on 07/13/2020 Aspirin 81 mg by mouth daily Phlebotomy to maintain hematocrit below 45%     Interim History:  [name] is back for followup.  He and his wife had a wonderful time up in [place] for his 50th high school reunion.  He really had a good time out there.  I am so glad that he could enjoyed.  He is doing okay.  His back still bothers him quite a bit.  It is hard for him to exercise and lose weight.  I noted that his blood counts are up a little bit.  We change his Hydrea dose to 500 mg a day back in March.  He may have to think about get him on a higher dose but for right now, we will just watch.  He has had no problems with diarrhea.  He has had no cough.  He has had no problems with the pacemaker.  He has had no rashes.  He has had some mild leg swelling.  Overall, his performance status is ECOG 1.    Medications:  Current Outpatient Medications:    allopurinol (ZYLOPRIM) 300 MG tablet, Take 300 mg by mouth daily., Disp: , Rfl:    BD PEN NEEDLE NANO U/F 32G X 4 MM MISC, , Disp: , Rfl:    Budeson-Glycopyrrol-Formoterol (BREZTRI AEROSPHERE) 160-9-4.8 MCG/ACT AERO, Inhale 2 puffs into the lungs 2 (two) times daily., Disp: 10.7 g, Rfl: 5   carvedilol (COREG) 12.5 MG tablet, TAKE 1 TABLET BY MOUTH  TWICE DAILY WITH MEALS, Disp: 180 tablet, Rfl: 3   colestipol (COLESTID) 1 g tablet, Take 1 tablet by mouth in the morning, at noon, and at bedtime., Disp: , Rfl:    dicyclomine (BENTYL) 20 MG tablet, Take 20 mg by mouth 4 (four) times daily., Disp: , Rfl:    folic acid (FOLVITE) 1 MG tablet, Take 1 mg by mouth daily., Disp: , Rfl:    gabapentin (NEURONTIN) 300 MG capsule, Take 300 mg by mouth daily., Disp: , Rfl:    hydroxyurea (HYDREA) 500 MG  capsule, TAKE TWO CAPSULES BY MOUTH DAILY, Disp: 180 capsule, Rfl: 5   insulin degludec (TRESIBA FLEXTOUCH) 100 UNIT/ML SOPN FlexTouch Pen, Inject 20 Units into the skin daily after breakfast. , Disp: , Rfl:    irbesartan (AVAPRO) 75 MG tablet, Take 1 tablet by mouth daily., Disp: , Rfl:    losartan (COZAAR) 25 MG tablet, Take 1 tablet (25 mg total) by mouth daily., Disp: 90 tablet, Rfl: 3   Magnesium Oxide 400 MG CAPS, Take 2 capsules (800 mg total) by mouth in the morning and at bedtime., Disp: 90 capsule, Rfl: 1   omeprazole (PRILOSEC) 20 MG capsule, Take 20 mg by mouth 2 (two) times daily before a meal. , Disp: , Rfl:    oxybutynin (DITROPAN-XL) 10 MG 24 hr tablet, Take 10 mg by mouth daily., Disp: , Rfl:    rosuvastatin (CRESTOR) 5 MG tablet, Take 5 mg by mouth daily., Disp: , Rfl:    spironolactone (ALDACTONE) 25 MG tablet, TAKE ONE-HALF TABLET BY  MOUTH DAILY, Disp: 45 tablet, Rfl: 3   tamsulosin (FLOMAX) 0.4 MG CAPS capsule, Take 0.4 mg by mouth 2 (two) times daily. , Disp: , Rfl:    traMADol (ULTRAM) 50 MG tablet,  Take 50 mg by mouth as needed., Disp: , Rfl:    albuterol (VENTOLIN HFA) 108 (90 Base) MCG/ACT inhaler, Inhale 2 puffs into the lungs every 6 (six) hours as needed for wheezing or shortness of breath. (Patient not taking: Reported on 11/15/2020), Disp: 8 g, Rfl: 6   citalopram (CELEXA) 10 MG tablet, Take 5 mg by mouth daily. , Disp: , Rfl:    furosemide (LASIX) 20 MG tablet, Take 20 mg by mouth daily. (Patient not taking: Reported on 11/15/2020), Disp: , Rfl:    furosemide (LASIX) 40 MG tablet, Take 1 tablet (40 mg total) by mouth daily. (Patient taking differently: Take 40 mg by mouth every other day.), Disp: 90 tablet, Rfl: 3 No current facility-administered medications for this visit.  Facility-Administered Medications Ordered in Other Visits:    0.9 %  sodium chloride infusion, , Intravenous, Continuous, Nyaisha Simao, Rudell Cobb, MD, Stopped at 05/16/13 1115  Allergies:  Allergies   Allergen Reactions   Bupropion Hives and Other (See Comments)   Cephalexin Diarrhea and Other (See Comments)    Caused C-diff, also    Fluoxetine Itching   Ibuprofen Itching   Prednisone Itching and Other (See Comments)    Abdominal pain, also    Temazepam Other (See Comments)    Dizziness    Trazodone And Nefazodone Other (See Comments)    Dizziness   Fluoxetine Hcl Itching   Prozac [Fluoxetine Hcl] Itching    Past Medical History, Surgical history, Social history, and Family History were reviewed and updated.  Review of Systems: Review of Systems  Constitutional: Negative.   HENT: Negative.    Eyes: Negative.   Respiratory:  Positive for shortness of breath.   Cardiovascular:  Positive for chest pain.  Gastrointestinal: Negative.   Genitourinary: Negative.   Musculoskeletal:  Positive for back pain.  Skin: Negative.   Neurological: Negative.   Endo/Heme/Allergies: Negative.   Psychiatric/Behavioral: Negative.     Physical Exam:  weight is 270 lb (122.5 kg). His oral temperature is 98.1 F (36.7 C). His blood pressure is 133/57 (abnormal) and his pulse is 76. His respiration is 20 and oxygen saturation is 97%.   Physical Exam Vitals reviewed.  HENT:     Head: Normocephalic and atraumatic.  Eyes:     Pupils: Pupils are equal, round, and reactive to light.  Cardiovascular:     Rate and Rhythm: Normal rate and regular rhythm.     Heart sounds: Normal heart sounds.  Pulmonary:     Effort: Pulmonary effort is normal.     Breath sounds: Normal breath sounds.  Abdominal:     General: Bowel sounds are normal.     Palpations: Abdomen is soft.  Musculoskeletal:        General: No tenderness or deformity. Normal range of motion.     Cervical back: Normal range of motion.  Lymphadenopathy:     Cervical: No cervical adenopathy.  Skin:    General: Skin is warm and dry.     Findings: No erythema or rash.  Neurological:     Mental Status: He is alert and oriented to  person, place, and time.  Psychiatric:        Behavior: Behavior normal.        Thought Content: Thought content normal.        Judgment: Judgment normal.     Lab Results  Component Value Date   WBC 11.2 (H) 11/15/2020   HGB 13.2 11/15/2020   HCT 43.5 11/15/2020  MCV 95.2 11/15/2020   PLT 419 (H) 11/15/2020     Chemistry      Component Value Date/Time   NA 137 11/15/2020 0740   NA 140 05/10/2020 0905   NA 141 04/10/2017 0923   NA 140 04/05/2016 0739   K 4.4 11/15/2020 0740   K 3.5 04/10/2017 0923   K 4.2 04/05/2016 0739   CL 106 11/15/2020 0740   CL 108 04/10/2017 0923   CO2 23 11/15/2020 0740   CO2 24 04/10/2017 0923   CO2 20 (L) 04/05/2016 0739   BUN 23 11/15/2020 0740   BUN 14 05/10/2020 0905   BUN 12 04/10/2017 0923   BUN 14.7 04/05/2016 0739   CREATININE 1.30 (H) 11/15/2020 0740   CREATININE 1.1 04/10/2017 0923   CREATININE 1.1 04/05/2016 0739      Component Value Date/Time   CALCIUM 9.1 11/15/2020 0740   CALCIUM 8.4 04/10/2017 0923   CALCIUM 9.0 04/05/2016 0739   ALKPHOS 64 11/15/2020 0740   ALKPHOS 77 04/10/2017 0923   ALKPHOS 91 04/05/2016 0739   AST 15 11/15/2020 0740   AST 34 04/05/2016 0739   ALT 11 11/15/2020 0740   ALT 32 04/10/2017 0923   ALT 40 04/05/2016 0739   BILITOT 0.5 11/15/2020 0740   BILITOT 0.48 04/05/2016 0739      Impression and Plan: William Villanueva is 70 year old gentleman with polycythemia vera.  He is on Hydrea.  He is doing well with the Hydrea.  We have not had phlebotomize him for many months.  We will still have to watch his blood counts closely.  Again, we may have to increase his Hydrea dose to 500 mg p.o. twice daily depending on what his next lab work shows.  I would like to get him back in 6 weeks.  I want to make sure that we stay on top of his blood counts and make any changes necessary.  Of note, we will keep praying for his wife.  She is having some health issues right now and we will keep her strong and  prior.   Volanda Napoleon, MD 7/11/20228:22 AM

## 2020-11-24 ENCOUNTER — Encounter: Payer: Medicare Other | Admitting: Acute Care

## 2020-11-24 ENCOUNTER — Ambulatory Visit (INDEPENDENT_AMBULATORY_CARE_PROVIDER_SITE_OTHER): Payer: Medicare Other | Admitting: Acute Care

## 2020-11-24 ENCOUNTER — Ambulatory Visit
Admission: RE | Admit: 2020-11-24 | Discharge: 2020-11-24 | Disposition: A | Discharge status: Home / Self Care | Payer: Medicare Other | Source: Ambulatory Visit | Attending: Acute Care | Admitting: Acute Care

## 2020-11-24 ENCOUNTER — Other Ambulatory Visit: Payer: Self-pay

## 2020-11-24 ENCOUNTER — Encounter: Payer: Self-pay | Admitting: Acute Care

## 2020-11-24 ENCOUNTER — Ambulatory Visit: Payer: Medicare Other

## 2020-11-24 VITALS — BP 128/62 | HR 79 | Temp 97.8°F | Ht 70.0 in | Wt 270.6 lb

## 2020-11-24 DIAGNOSIS — Z87891 Personal history of nicotine dependence: Secondary | ICD-10-CM | POA: Diagnosis not present

## 2020-11-24 NOTE — Patient Instructions (Signed)
Thank you for participating in the [facility]. It was our pleasure to meet you today. We will call you with the results of your scan within the next few days. Your scan will be assigned a Lung RADS category score by the physicians reading the scans.  This Lung RADS score determines follow up scanning.  See below for description of categories, and follow up screening recommendations. We will be in touch to schedule your follow up screening annually or based on recommendations of our providers. We will fax a copy of your scan results to your Primary Care Physician, or the physician who referred you to the program, to ensure they have the results. Please call the office if you have any questions or concerns regarding your scanning experience or results.  Our office number is [phone]. Please speak with Denise Phelps, RN. She is our Lung Cancer Screening RN. If she is unavailable when you call, please have the office staff send her a message. She will return your call at her earliest convenience. Remember, if your scan is normal, we will scan you annually as long as you continue to meet the criteria for the program. (Age 55-77, Current smoker or smoker who has quit within the last 15 years). If you are a smoker, remember, quitting is the single most powerful action that you can take to decrease your risk of lung cancer and other pulmonary, breathing related problems. We know quitting is hard, and we are here to help.  Please let us know if there is anything we can do to help you meet your goal of quitting. If you are a former smoker, congratulations. We are proud of you! Remain smoke free! Remember you can refer friends or family members through the number above.  We will screen them to make sure they meet criteria for the program. Thank you for helping us take better care of you by participating in Lung Screening.  Lung RADS Categories:  Lung RADS 1: no nodules  or definitely non-concerning nodules.  Recommendation is for a repeat annual scan in 12 months.  Lung RADS 2:  nodules that are non-concerning in appearance and behavior with a very low likelihood of becoming an active cancer. Recommendation is for a repeat annual scan in 12 months.  Lung RADS 3: nodules that are probably non-concerning , includes nodules with a low likelihood of becoming an active cancer.  Recommendation is for a 6-month repeat screening scan. Often noted after an upper respiratory illness. We will be in touch to make sure you have no questions, and to schedule your 6-month scan.  Lung RADS 4 A: nodules with concerning findings, recommendation is most often for a follow up scan in 3 months or additional testing based on our provider's assessment of the scan. We will be in touch to make sure you have no questions and to schedule the recommended 3 month follow up scan.  Lung RADS 4 B:  indicates findings that are concerning. We will be in touch with you to schedule additional diagnostic testing based on our provider's  assessment of the scan.   

## 2020-11-24 NOTE — Progress Notes (Signed)
Shared Decision Making Visit Lung Cancer Screening Program [phone])   Eligibility: Age 70 y.o. Pack Years Smoking History Calculation 34 pack year smoking history (# packs/per year x # years smoked) Recent History of coughing up blood  no Unexplained weight loss? no ( >Than 15 pounds within the last 6 months ) Prior History Lung / other cancer no (Diagnosis within the last 5 years already requiring surveillance chest CT Scans). Smoking Status Former Smoker Former Smokers: Years since quit: 8 years  Quit Date: 2014  Visit Components: Discussion included one or more decision making aids. yes Discussion included risk/benefits of screening. yes Discussion included potential follow up diagnostic testing for abnormal scans. yes Discussion included meaning and risk of over diagnosis. yes Discussion included meaning and risk of False Positives. yes Discussion included meaning of total radiation exposure. yes  Counseling Included: Importance of adherence to annual lung cancer LDCT screening. yes Impact of comorbidities on ability to participate in the program. yes Ability and willingness to under diagnostic treatment. yes  Smoking Cessation Counseling: Current Smokers:  Discussed importance of smoking cessation. yes Information about tobacco cessation classes and interventions provided to patient. yes Patient provided with "ticket" for LDCT Scan. yes Symptomatic Patient. no  Counseling(Intensive: > 10 minutes counseling) 99407 Diagnosis Code: Tobacco Use Z72.0 Asymptomatic Patient yes  Counseling (Intermediate counseling: > three minutes counseling) U2725 Former Smokers:  Discussed the importance of maintaining cigarette abstinence. yes Diagnosis Code: Personal History of Nicotine Dependence. D66.440 Information about tobacco cessation classes and interventions provided to patient. Yes Patient provided with "ticket" for LDCT Scan. yes Written Order for Lung Cancer Screening with  LDCT placed in Epic. Yes (CT Chest Lung Cancer Screening Low Dose W/O CM) [id number] Z12.2-Screening of respiratory organs Z87.891-Personal history of nicotine dependence  I spent 25 minutes of face to face time with William Villanueva discussing the risks and benefits of lung cancer screening. We viewed a power point together that explained in detail the above noted topics. We took the time to pause the power point at intervals to allow for questions to be asked and answered to ensure understanding. We discussed that he had taken the single most powerful action possible to decrease his risk of developing lung cancer when he quit smoking. I counseled him to remain smoke free, and to contact me if he ever had the desire to smoke again so that I can provide resources and tools to help support the effort to remain smoke free. We discussed the time and location of the scan, and that either  William Glassman RN or I will call with the results within  24-48 hours of receiving them. He has my card and contact information in the event he needs to speak with me, in addition to a copy of the power point we reviewed as a resource. He verbalized understanding of all of the above and had no further questions upon leaving the office.     I explained to the patient that there has been a high incidence of coronary artery disease noted on these exams. I explained that this is a non-gated exam therefore degree or severity cannot be determined. This patient is currently on statin therapy. I have asked the patient to follow-up with their PCP regarding any incidental finding of coronary artery disease and management with diet or medication as they feel is clinically indicated. The patient verbalized understanding of the above and had no further questions.     Magdalen Spatz, NP 11/24/2020 .

## 2020-11-29 ENCOUNTER — Telehealth: Payer: Self-pay | Admitting: Pulmonary Disease

## 2020-11-29 DIAGNOSIS — J449 Chronic obstructive pulmonary disease, unspecified: Secondary | ICD-10-CM

## 2020-11-29 NOTE — Telephone Encounter (Signed)
Called and spoke with patient, states that over the past 2 weeks he has noticed that he has a raspy voice and cannot talk very loud.  He denies any sore throat or white patches in this mouth/throat.  He believes it is coming from the [place] inhaler.  Advised I would make Dr. Loanne Drilling aware.  Dr. Loanne Drilling, please advise regarding Breztri inhaler.  Thank you.

## 2020-11-30 NOTE — Telephone Encounter (Signed)
Patient has tried multiple inhalers in the past including Breztri, Stiolto, Spiriva and Anoro and failed. He has also had hoarseness in the past due to powdered inhalers.   Due to his moderate COPD and history of exacerbations, he will need some form of inhalers.  Please ask him if he has a nebulizer. Arrange if he does not.  Will need the following meds ordered: --Duonebs three times daily and q6h as needed --Brovana 15 mcg two times daily  He will need follow-up with me in two months

## 2020-11-30 NOTE — Telephone Encounter (Addendum)
Called and spoke with patient to let him know the recommendations from Dr. Loanne Drilling. He does not have a nebulizer and would like to talk it over with his wife first and said he will call the office back tomorrow and let us know what they have decided. Will wait to hear from patient. Patient also needs to be scheduled with Dr.Ellison in September when he calls back

## 2020-12-01 MED ORDER — IPRATROPIUM-ALBUTEROL 0.5-2.5 (3) MG/3ML IN SOLN
RESPIRATORY_TRACT | 11 refills | Status: DC
Start: 2020-12-01 — End: 2021-01-18

## 2020-12-01 MED ORDER — ARFORMOTEROL TARTRATE 15 MCG/2ML IN NEBU: 15.0000 ug | INHALATION_SOLUTION | Freq: Two times a day (BID) | RESPIRATORY_TRACT | 11 refills | Status: DC

## 2020-12-01 NOTE — Telephone Encounter (Signed)
I called and spoke with the pt Answered his questions about what a nebulizer machine is and what it does  He is okay with trying this  I sent order to [facility] for neb machine and suppliues, and rxs for duoneb and brovana sent to [facility]  I faxed ov note and ins cards to attn margaret at [facility]  Pt aware that someone from [facility] will reach out to him  Nothing further needed

## 2020-12-02 DIAGNOSIS — I130 Hypertensive heart and chronic kidney disease with heart failure and stage 1 through stage 4 chronic kidney disease, or unspecified chronic kidney disease: Secondary | ICD-10-CM | POA: Diagnosis not present

## 2020-12-02 DIAGNOSIS — M47812 Spondylosis without myelopathy or radiculopathy, cervical region: Secondary | ICD-10-CM | POA: Diagnosis not present

## 2020-12-02 DIAGNOSIS — E1122 Type 2 diabetes mellitus with diabetic chronic kidney disease: Secondary | ICD-10-CM | POA: Diagnosis not present

## 2020-12-02 DIAGNOSIS — M069 Rheumatoid arthritis, unspecified: Secondary | ICD-10-CM | POA: Diagnosis not present

## 2020-12-02 DIAGNOSIS — I5042 Chronic combined systolic (congestive) and diastolic (congestive) heart failure: Secondary | ICD-10-CM | POA: Diagnosis not present

## 2020-12-02 DIAGNOSIS — I10 Essential (primary) hypertension: Secondary | ICD-10-CM | POA: Diagnosis not present

## 2020-12-02 DIAGNOSIS — N183 Chronic kidney disease, stage 3 unspecified: Secondary | ICD-10-CM | POA: Diagnosis not present

## 2020-12-02 DIAGNOSIS — J449 Chronic obstructive pulmonary disease, unspecified: Secondary | ICD-10-CM | POA: Diagnosis not present

## 2020-12-02 DIAGNOSIS — K219 Gastro-esophageal reflux disease without esophagitis: Secondary | ICD-10-CM | POA: Diagnosis not present

## 2020-12-02 DIAGNOSIS — E7800 Pure hypercholesterolemia, unspecified: Secondary | ICD-10-CM | POA: Diagnosis not present

## 2020-12-03 ENCOUNTER — Other Ambulatory Visit: Payer: Self-pay | Admitting: Hematology & Oncology

## 2020-12-03 DIAGNOSIS — J449 Chronic obstructive pulmonary disease, unspecified: Secondary | ICD-10-CM | POA: Diagnosis not present

## 2020-12-03 DIAGNOSIS — D45 Polycythemia vera: Secondary | ICD-10-CM

## 2020-12-03 DIAGNOSIS — R55 Syncope and collapse: Secondary | ICD-10-CM | POA: Diagnosis not present

## 2020-12-06 ENCOUNTER — Ambulatory Visit (INDEPENDENT_AMBULATORY_CARE_PROVIDER_SITE_OTHER): Payer: Medicare Other

## 2020-12-06 ENCOUNTER — Telehealth: Payer: Self-pay | Admitting: Pulmonary Disease

## 2020-12-06 DIAGNOSIS — Z950 Presence of cardiac pacemaker: Secondary | ICD-10-CM | POA: Diagnosis not present

## 2020-12-06 DIAGNOSIS — I5042 Chronic combined systolic (congestive) and diastolic (congestive) heart failure: Secondary | ICD-10-CM

## 2020-12-06 NOTE — Telephone Encounter (Signed)
Dr. Loanne Drilling, are you okay if we switch this patient to [place] instead of the perforomist due to cost?

## 2020-12-07 MED ORDER — FORMOTEROL FUMARATE 20 MCG/2ML IN NEBU: 20.0000 ug | INHALATION_SOLUTION | Freq: Two times a day (BID) | RESPIRATORY_TRACT | 11 refills | Status: DC

## 2020-12-07 NOTE — Telephone Encounter (Signed)
Ok to switch to performist

## 2020-12-08 ENCOUNTER — Other Ambulatory Visit: Payer: Self-pay | Admitting: *Deleted

## 2020-12-08 NOTE — Progress Notes (Signed)
EPIC Encounter for ICM Monitoring  Patient Name: William Villanueva is a 70 y.o. male Date: 12/08/2020 Primary Care Physican: Antony Contras, MD Primary Cardiologist: Stanford Breed Electrophysiologist: Vergie Living Pacing: 96%            12/08/2020 Weight: 265-268 lbs                                                            Spoke with patient and reports feeling well at this time. Heart failure questions reviewed. Pt asymptomatic for fluid.                  [place]ue thoracic impedance suggesting normal fluid levels.     Prescribed:   Furosemide 40 mg take 1 tablet daily.   Patient takes differently: taking Furosemide every other day. Spironolactone 25 mg take 0.5 tablet (12.5 mg total) once a day   Labs: 11/15/2020 Creatinine 1.30, BUN 23, Potassium 4.4, Sodium 137, GFR 59 09/13/2020 Creatinine 1.24, BUN 17, Potassium 4.1, Sodium 138, GFR >60 07/13/2020 Creatinine 1.44, BUN 24, Potassium 4.5, Sodium 135, GFR 53 05/14/2020 Creatinine 1.02, BUN 18, Potassium 4.2, Sodium 141, GFR >60  A complete set of results can be found in Results Review.   Recommendations:  No changes and encouraged to call if experiencing any fluid symptoms.   Follow-up plan: ICM clinic phone appointment on 01/17/2021.   91 day device clinic remote transmission 12/16/2020.     EP/Cardiology Office Visits:  None   Copy of ICM check sent to Dr. Caryl Comes.     3 month ICM trend: 12/02/2020.    Rosalene Billings, RN 12/08/2020 5:11 PM

## 2020-12-09 NOTE — Progress Notes (Signed)
Please call patient and let them  know their  low dose Ct was read as a Lung RADS 2: nodules that are benign in appearance and behavior with a very low likelihood of becoming a clinically active cancer due to size or lack of growth. Recommendation per radiology is for a repeat LDCT in 12 months. .Please let them  know we will order and schedule their  annual screening scan for 11/2021. Please let them  know there was notation of CAD on their  scan.  Please remind the patient  that this is a non-gated exam therefore degree or severity of disease  cannot be determined. Please have them  follow up with their PCP regarding potential risk factor modification, dietary therapy or pharmacologic therapy if clinically indicated. Pt.  is  currently on statin therapy. Please place order for annual  screening scan for  11/2021 and fax results to PCP. Thanks so much.  + CAD>> aortic atherosclerosis, in addition to left main and 3 vessel coronary artery disease. They are on statin therapy. Have them follow up with PCP for any further management if clinically indicated. Thanks so much

## 2020-12-10 ENCOUNTER — Other Ambulatory Visit: Payer: Self-pay | Admitting: *Deleted

## 2020-12-10 ENCOUNTER — Telehealth: Payer: Self-pay | Admitting: Pulmonary Disease

## 2020-12-10 DIAGNOSIS — D692 Other nonthrombocytopenic purpura: Secondary | ICD-10-CM | POA: Diagnosis not present

## 2020-12-10 DIAGNOSIS — D1801 Hemangioma of skin and subcutaneous tissue: Secondary | ICD-10-CM | POA: Diagnosis not present

## 2020-12-10 DIAGNOSIS — L723 Sebaceous cyst: Secondary | ICD-10-CM | POA: Diagnosis not present

## 2020-12-10 DIAGNOSIS — L821 Other seborrheic keratosis: Secondary | ICD-10-CM | POA: Diagnosis not present

## 2020-12-10 DIAGNOSIS — D225 Melanocytic nevi of trunk: Secondary | ICD-10-CM | POA: Diagnosis not present

## 2020-12-10 DIAGNOSIS — D485 Neoplasm of uncertain behavior of skin: Secondary | ICD-10-CM | POA: Diagnosis not present

## 2020-12-10 DIAGNOSIS — L578 Other skin changes due to chronic exposure to nonionizing radiation: Secondary | ICD-10-CM | POA: Diagnosis not present

## 2020-12-10 DIAGNOSIS — L814 Other melanin hyperpigmentation: Secondary | ICD-10-CM | POA: Diagnosis not present

## 2020-12-10 DIAGNOSIS — L570 Actinic keratosis: Secondary | ICD-10-CM | POA: Diagnosis not present

## 2020-12-10 DIAGNOSIS — Z87891 Personal history of nicotine dependence: Secondary | ICD-10-CM

## 2020-12-10 NOTE — Telephone Encounter (Signed)
Pt has received his nebulizer machine. Has received Duoneb, the pharmacy wants to send Performist, however told pt it'll be ~$120 for 30 day supply. Pt states he is unable to afford that. Would like an alternate medication sent to [facility]. Please advise [phone]

## 2020-12-10 NOTE — Telephone Encounter (Signed)
Call returned to patient, confirmed DOB. Patient states [facility] is trying to charge him$120 for the perforomist and he cannot afford that. He is looking for something comparable to the perforomist but cheaper. I encouraged the patient to call his insurance to find out what is cheaper and affordable. Made aware I would reach out to his provider to see if there was something comparable that we could try as well. Voiced understanding.   JE please advise if there is something comparable to Perforomist that we could prescribe for the patient. Thanks :) Patient aware there may be a delayed response since you return to clinic on 8/11.

## 2020-12-13 ENCOUNTER — Other Ambulatory Visit: Payer: Self-pay

## 2020-12-13 MED ORDER — FUROSEMIDE 40 MG PO TABS: 40.0000 mg | ORAL_TABLET | Freq: Every day | ORAL | 3 refills | Status: DC

## 2020-12-13 NOTE — Telephone Encounter (Signed)
William Villanueva is off this wk. [facility] does file medicare part B. Will sent community msg to [employer] to see if they can help figure out price for brovana or performist.

## 2020-12-13 NOTE — Telephone Encounter (Signed)
What is the cost of his nebulizer if we utilize his Part B Medicare plan? I believe Melissa at [facility] may be able to help navigate this for [place]. Please call [phone] to find our cost for either Brovana or Performist.

## 2020-12-15 NOTE — Telephone Encounter (Signed)
Received response back from [facility]- she sent an email to see about pricing for nebs. Will await her response.

## 2020-12-16 ENCOUNTER — Ambulatory Visit (INDEPENDENT_AMBULATORY_CARE_PROVIDER_SITE_OTHER): Payer: Medicare Other

## 2020-12-16 DIAGNOSIS — I130 Hypertensive heart and chronic kidney disease with heart failure and stage 1 through stage 4 chronic kidney disease, or unspecified chronic kidney disease: Secondary | ICD-10-CM | POA: Diagnosis not present

## 2020-12-16 DIAGNOSIS — N183 Chronic kidney disease, stage 3 unspecified: Secondary | ICD-10-CM | POA: Diagnosis not present

## 2020-12-16 DIAGNOSIS — I10 Essential (primary) hypertension: Secondary | ICD-10-CM | POA: Diagnosis not present

## 2020-12-16 DIAGNOSIS — I5043 Acute on chronic combined systolic (congestive) and diastolic (congestive) heart failure: Secondary | ICD-10-CM | POA: Diagnosis not present

## 2020-12-16 DIAGNOSIS — I441 Atrioventricular block, second degree: Secondary | ICD-10-CM

## 2020-12-16 DIAGNOSIS — K219 Gastro-esophageal reflux disease without esophagitis: Secondary | ICD-10-CM | POA: Diagnosis not present

## 2020-12-16 DIAGNOSIS — M1030 Gout due to renal impairment, unspecified site: Secondary | ICD-10-CM | POA: Diagnosis not present

## 2020-12-16 DIAGNOSIS — E7800 Pure hypercholesterolemia, unspecified: Secondary | ICD-10-CM | POA: Diagnosis not present

## 2020-12-16 DIAGNOSIS — J449 Chronic obstructive pulmonary disease, unspecified: Secondary | ICD-10-CM | POA: Diagnosis not present

## 2020-12-16 DIAGNOSIS — E1122 Type 2 diabetes mellitus with diabetic chronic kidney disease: Secondary | ICD-10-CM | POA: Diagnosis not present

## 2020-12-16 LAB — CUP PACEART REMOTE DEVICE CHECK
Battery Remaining Longevity: 12 mo
Battery Remaining Percentage: 14 %
Battery Voltage: 2.86 V
Brady Statistic AP VP Percent: 8.4 %
Brady Statistic AP VS Percent: 1 %
Brady Statistic AS VP Percent: 87 %
Brady Statistic AS VS Percent: 1 %
Brady Statistic RA Percent Paced: 5.2 %
Date Time Interrogation Session: 20220811025538
Implantable Lead Implant Date: 20151223
Implantable Lead Implant Date: 20151223
Implantable Lead Implant Date: 20151223
Implantable Lead Location: 753858
Implantable Lead Location: 753859
Implantable Lead Location: 753860
Implantable Pulse Generator Implant Date: 20151223
Lead Channel Impedance Value: 410 Ohm
Lead Channel Impedance Value: 410 Ohm
Lead Channel Impedance Value: 630 Ohm
Lead Channel Pacing Threshold Amplitude: 1.25 V
Lead Channel Pacing Threshold Amplitude: 1.25 V
Lead Channel Pacing Threshold Amplitude: 1.375 V
Lead Channel Pacing Threshold Pulse Width: 0.4 ms
Lead Channel Pacing Threshold Pulse Width: 0.4 ms
Lead Channel Pacing Threshold Pulse Width: 0.7 ms
Lead Channel Sensing Intrinsic Amplitude: 2.6 mV
Lead Channel Sensing Intrinsic Amplitude: 8.2 mV
Lead Channel Setting Pacing Amplitude: 2.25 V
Lead Channel Setting Pacing Amplitude: 2.375
Lead Channel Setting Pacing Amplitude: 2.5 V
Lead Channel Setting Pacing Pulse Width: 0.4 ms
Lead Channel Setting Pacing Pulse Width: 0.7 ms
Lead Channel Setting Sensing Sensitivity: 2 mV
Pulse Gen Model: 3242
Pulse Gen Serial Number: 7701275

## 2020-12-17 ENCOUNTER — Other Ambulatory Visit: Payer: Self-pay | Admitting: *Deleted

## 2020-12-17 DIAGNOSIS — D45 Polycythemia vera: Secondary | ICD-10-CM

## 2020-12-17 MED ORDER — HYDROXYUREA 500 MG PO CAPS: 1000.0000 mg | ORAL_CAPSULE | Freq: Every day | ORAL | 5 refills | Status: DC

## 2020-12-21 NOTE — Telephone Encounter (Signed)
Can I get an update on pricing please? It has been over a week since we contacted [employer] and the original message from the patient was from 12/10/20.

## 2020-12-21 NOTE — Telephone Encounter (Signed)
Dr. Loanne Drilling, I have heard back from Bouse at [facility]. Please see message below:  Hi! We don't know for sure without the prescription and information off the patient's insurance card, but typically they are about $200. (the patient cannot get both)

## 2020-12-21 NOTE — Telephone Encounter (Signed)
I have sent a community message to both Andee Poles and [place] for update on pricing. Will await their message.

## 2020-12-22 ENCOUNTER — Telehealth (HOSPITAL_BASED_OUTPATIENT_CLINIC_OR_DEPARTMENT_OTHER): Payer: Self-pay | Admitting: *Deleted

## 2020-12-22 ENCOUNTER — Other Ambulatory Visit (HOSPITAL_BASED_OUTPATIENT_CLINIC_OR_DEPARTMENT_OTHER): Payer: Self-pay | Admitting: *Deleted

## 2020-12-22 MED ORDER — LOSARTAN POTASSIUM 25 MG PO TABS: 25.0000 mg | ORAL_TABLET | Freq: Every day | ORAL | 2 refills | Status: DC

## 2020-12-22 NOTE — Telephone Encounter (Signed)
Patient would like Losartan 25 mg prescription to be sent to [facility]. Prescription was originally sent to [facility], [phone], [address] [place], [place]. Patient picked up a 90 day supply and would like the remaining refills to be sent to [facility].  Losartan 25 mg tablets taking 1 tablet daily #90 with 2 refills sent to [facility]. Patient advised that prescription has been sent.

## 2020-12-23 NOTE — Telephone Encounter (Signed)
Called and spoke with patient, advised of recommendations per Dr. Loanne Drilling.  He verbalized understanding.  Patient scheduled to see JE on Tuesday September 13 at 10:30 am, advised to arrive by 10:15 am for check in.  Nothing further needed.

## 2020-12-23 NOTE — Telephone Encounter (Signed)
Please contact patient regarding the information below. The cost of Performist is $120 and the only alternative is likely to be more expensive.   I recommend continuing his current nebulizer: --Duonebs three times daily and every 6 hours as needed --Please also schedule follow-up with me when next available for routine visit  Rodman Pickle, M.D. [facility] Pulmonary/Critical Care Medicine 12/23/2020 1:14 PM

## 2020-12-27 ENCOUNTER — Inpatient Hospital Stay: Payer: Medicare Other

## 2020-12-27 ENCOUNTER — Other Ambulatory Visit: Payer: Self-pay

## 2020-12-27 ENCOUNTER — Inpatient Hospital Stay (HOSPITAL_BASED_OUTPATIENT_CLINIC_OR_DEPARTMENT_OTHER): Payer: Medicare Other | Admitting: Hematology & Oncology

## 2020-12-27 ENCOUNTER — Telehealth: Payer: Self-pay

## 2020-12-27 ENCOUNTER — Encounter: Payer: Self-pay | Admitting: Hematology & Oncology

## 2020-12-27 ENCOUNTER — Inpatient Hospital Stay: Payer: Medicare Other | Attending: Hematology & Oncology

## 2020-12-27 VITALS — BP 111/72 | HR 92 | Temp 98.1°F | Resp 20 | Wt 274.0 lb

## 2020-12-27 DIAGNOSIS — D45 Polycythemia vera: Secondary | ICD-10-CM | POA: Diagnosis not present

## 2020-12-27 DIAGNOSIS — I441 Atrioventricular block, second degree: Secondary | ICD-10-CM | POA: Diagnosis not present

## 2020-12-27 LAB — CMP (CANCER CENTER ONLY)
ALT: 13 U/L (ref 0–44)
AST: 17 U/L (ref 15–41)
Albumin: 4 g/dL (ref 3.5–5.0)
Alkaline Phosphatase: 66 U/L (ref 38–126)
Anion gap: 12 (ref 5–15)
BUN: 13 mg/dL (ref 8–23)
CO2: 23 mmol/L (ref 22–32)
Calcium: 9 mg/dL (ref 8.9–10.3)
Chloride: 104 mmol/L (ref 98–111)
Creatinine: 1.27 mg/dL — ABNORMAL HIGH (ref 0.61–1.24)
GFR, Estimated: >60 mL/min (ref >60)
Glucose, Bld: 173 mg/dL — ABNORMAL HIGH (ref 70–99)
Potassium: 4 mmol/L (ref 3.5–5.1)
Sodium: 139 mmol/L (ref 135–145)
Total Bilirubin: 0.6 mg/dL (ref 0.3–1.2)
Total Protein: 6.9 g/dL (ref 6.5–8.1)

## 2020-12-27 LAB — IRON AND TIBC
Iron: 39 ug/dL — ABNORMAL LOW (ref 42–163)
Saturation Ratios: 9 % — ABNORMAL LOW (ref 20–55)
TIBC: 419 ug/dL — ABNORMAL HIGH (ref 202–409)
UIBC: 379 ug/dL — ABNORMAL HIGH (ref 117–376)

## 2020-12-27 LAB — CBC WITH DIFFERENTIAL (CANCER CENTER ONLY)
Abs Immature Granulocytes: 0.09 10*3/uL — ABNORMAL HIGH (ref 0.00–0.07)
Basophils Absolute: 0.2 10*3/uL — ABNORMAL HIGH (ref 0.0–0.1)
Basophils Relative: 3 %
Eosinophils Absolute: 0.1 10*3/uL (ref 0.0–0.5)
Eosinophils Relative: 2 %
HCT: 41.4 % (ref 39.0–52.0)
Hemoglobin: 12.8 g/dL — ABNORMAL LOW (ref 13.0–17.0)
Immature Granulocytes: 1 %
Lymphocytes Relative: 16 %
Lymphs Abs: 1.2 10*3/uL (ref 0.7–4.0)
MCH: 30 pg (ref 26.0–34.0)
MCHC: 30.9 g/dL (ref 30.0–36.0)
MCV: 97 fL (ref 80.0–100.0)
Monocytes Absolute: 0.5 10*3/uL (ref 0.1–1.0)
Monocytes Relative: 7 %
Neutro Abs: 5.5 10*3/uL (ref 1.7–7.7)
Neutrophils Relative %: 71 %
Platelet Count: 533 10*3/uL — ABNORMAL HIGH (ref 150–400)
RBC: 4.27 MIL/uL (ref 4.22–5.81)
RDW: 18.6 % — ABNORMAL HIGH (ref 11.5–15.5)
WBC Count: 7.6 10*3/uL (ref 4.0–10.5)
nRBC: 0.5 % — ABNORMAL HIGH (ref 0.0–0.2)

## 2020-12-27 LAB — LACTATE DEHYDROGENASE: LDH: 296 U/L — ABNORMAL HIGH (ref 98–192)

## 2020-12-27 LAB — FERRITIN: Ferritin: 12 ng/mL — ABNORMAL LOW (ref 24–336)

## 2020-12-27 NOTE — Progress Notes (Signed)
Hematology and Oncology Follow Up Visit  Mansfield Dann [id number] 11/07/1950 70 y.o. 12/27/2020   Principle Diagnosis:  Polycythemia vera-JAK2 positive Heart block-Mobitz II  Current Therapy:   Hydrea 500 mg p.o.BID -dose changed on 11/15/2020 Aspirin 81 mg by mouth daily Phlebotomy to maintain hematocrit below 45%     Interim History:  Mr.  Pehl is back for followup.  The last time that we saw him, we had to change his Hydrea dose back up to 100 mg p.o. twice daily.  His platelet count still on the higher side.  I would like to keep the Hydrea dose where it is right now.  He has had no problems with diarrhea.  He had been admitted earlier this year because of bad diarrhea.  He had bad colitis.  He is doing well with his pacemaker.  He has had no difficulty with this.  His lower back bothers him quite a bit.  He is going over some injections I think later on this week.  His appetites been okay.  He is try to watch what he eats.  There is no problems with rashes.  He has had no swollen lymph nodes.  He has had no cough.  He said he had a recent CT scan of the chest done.  This was done on 11/24/2020.  This did not show any suspicious findings for lung cancer.  Overall, his performance status is ECOG 1.    Medications:  Current Outpatient Medications:    allopurinol (ZYLOPRIM) 300 MG tablet, Take 300 mg by mouth daily., Disp: , Rfl:    BD PEN NEEDLE NANO U/F 32G X 4 MM MISC, , Disp: , Rfl:    carvedilol (COREG) 12.5 MG tablet, TAKE 1 TABLET BY MOUTH  TWICE DAILY WITH MEALS, Disp: 180 tablet, Rfl: 3   citalopram (CELEXA) 10 MG tablet, Take 5 mg by mouth daily. , Disp: , Rfl:    colestipol (COLESTID) 1 g tablet, Take 1 tablet by mouth in the morning, at noon, and at bedtime., Disp: , Rfl:    dicyclomine (BENTYL) 20 MG tablet, Take 20 mg by mouth 4 (four) times daily., Disp: , Rfl:    folic acid (FOLVITE) 1 MG tablet, Take 1 mg by mouth daily., Disp: , Rfl:    formoterol  (PERFOROMIST) 20 MCG/2ML nebulizer solution, Take 2 mLs (20 mcg total) by nebulization 2 (two) times daily., Disp: 120 mL, Rfl: 11   furosemide (LASIX) 40 MG tablet, Take 1 tablet (40 mg total) by mouth daily., Disp: 90 tablet, Rfl: 3   gabapentin (NEURONTIN) 300 MG capsule, Take 300 mg by mouth daily., Disp: , Rfl:    hydroxyurea (HYDREA) 500 MG capsule, Take 2 capsules (1,000 mg total) by mouth daily. May take with food to minimize GI side effects. Currently taking 500 mg daily but MD note says may need to increase on 12/27/2020 to twice daily., Disp: 180 capsule, Rfl: 5   insulin degludec (TRESIBA FLEXTOUCH) 100 UNIT/ML SOPN FlexTouch Pen, Inject 20 Units into the skin daily after breakfast. , Disp: , Rfl:    irbesartan (AVAPRO) 75 MG tablet, Take 1 tablet by mouth daily., Disp: , Rfl:    losartan (COZAAR) 25 MG tablet, Take 1 tablet (25 mg total) by mouth daily., Disp: 90 tablet, Rfl: 2   Magnesium Oxide 400 MG CAPS, Take 2 capsules (800 mg total) by mouth in the morning and at bedtime., Disp: 90 capsule, Rfl: 1   omeprazole (PRILOSEC) 20 MG capsule, Take  20 mg by mouth 2 (two) times daily before a meal. , Disp: , Rfl:    oxybutynin (DITROPAN-XL) 10 MG 24 hr tablet, Take 10 mg by mouth daily., Disp: , Rfl:    rosuvastatin (CRESTOR) 5 MG tablet, Take 5 mg by mouth daily., Disp: , Rfl:    spironolactone (ALDACTONE) 25 MG tablet, TAKE ONE-HALF TABLET BY  MOUTH DAILY, Disp: 45 tablet, Rfl: 3   tamsulosin (FLOMAX) 0.4 MG CAPS capsule, Take 0.4 mg by mouth 2 (two) times daily. , Disp: , Rfl:    traMADol (ULTRAM) 50 MG tablet, Take 50 mg by mouth as needed., Disp: , Rfl:    albuterol (VENTOLIN HFA) 108 (90 Base) MCG/ACT inhaler, Inhale 2 puffs into the lungs every 6 (six) hours as needed for wheezing or shortness of breath. (Patient not taking: Reported on 12/27/2020), Disp: 8 g, Rfl: 6   ipratropium-albuterol (DUONEB) 0.5-2.5 (3) MG/3ML SOLN, 1 vial three times daily and every 6 hours as needed, Disp: 360  mL, Rfl: 11 No current facility-administered medications for this visit.  Facility-Administered Medications Ordered in Other Visits:    0.9 %  sodium chloride infusion, , Intravenous, Continuous, Irais Mottram, Rudell Cobb, MD, Stopped at 05/16/13 1115  Allergies:  Allergies  Allergen Reactions   Bupropion Hives and Other (See Comments)   Cephalexin Diarrhea and Other (See Comments)    Caused C-diff, also    Fluoxetine Itching   Ibuprofen Itching   Prednisone Itching and Other (See Comments)    Abdominal pain, also    Temazepam Other (See Comments)    Dizziness    Trazodone And Nefazodone Other (See Comments)    Dizziness   Fluoxetine Hcl Itching   Prozac [Fluoxetine Hcl] Itching    Past Medical History, Surgical history, Social history, and Family History were reviewed and updated.  Review of Systems: Review of Systems  Constitutional: Negative.   HENT: Negative.    Eyes: Negative.   Respiratory:  Positive for shortness of breath.   Cardiovascular:  Positive for chest pain.  Gastrointestinal: Negative.   Genitourinary: Negative.   Musculoskeletal:  Positive for back pain.  Skin: Negative.   Neurological: Negative.   Endo/Heme/Allergies: Negative.   Psychiatric/Behavioral: Negative.     Physical Exam:  weight is 274 lb (124.3 kg). His oral temperature is 98.1 F (36.7 C). His blood pressure is 111/72 and his pulse is 92. His respiration is 20.   Physical Exam Vitals reviewed.  HENT:     Head: Normocephalic and atraumatic.  Eyes:     Pupils: Pupils are equal, round, and reactive to light.  Cardiovascular:     Rate and Rhythm: Normal rate and regular rhythm.     Heart sounds: Normal heart sounds.  Pulmonary:     Effort: Pulmonary effort is normal.     Breath sounds: Normal breath sounds.  Abdominal:     General: Bowel sounds are normal.     Palpations: Abdomen is soft.  Musculoskeletal:        General: No tenderness or deformity. Normal range of motion.      Cervical back: Normal range of motion.  Lymphadenopathy:     Cervical: No cervical adenopathy.  Skin:    General: Skin is warm and dry.     Findings: No erythema or rash.  Neurological:     Mental Status: He is alert and oriented to person, place, and time.  Psychiatric:        Behavior: Behavior normal.  Thought Content: Thought content normal.        Judgment: Judgment normal.     Lab Results  Component Value Date   WBC 7.6 12/27/2020   HGB 12.8 (L) 12/27/2020   HCT 41.4 12/27/2020   MCV 97.0 12/27/2020   PLT 533 (H) 12/27/2020     Chemistry      Component Value Date/Time   NA 137 11/15/2020 0740   NA 140 05/10/2020 0905   NA 141 04/10/2017 0923   NA 140 04/05/2016 0739   K 4.4 11/15/2020 0740   K 3.5 04/10/2017 0923   K 4.2 04/05/2016 0739   CL 106 11/15/2020 0740   CL 108 04/10/2017 0923   CO2 23 11/15/2020 0740   CO2 24 04/10/2017 0923   CO2 20 (L) 04/05/2016 0739   BUN 23 11/15/2020 0740   BUN 14 05/10/2020 0905   BUN 12 04/10/2017 0923   BUN 14.7 04/05/2016 0739   CREATININE 1.30 (H) 11/15/2020 0740   CREATININE 1.1 04/10/2017 0923   CREATININE 1.1 04/05/2016 0739      Component Value Date/Time   CALCIUM 9.1 11/15/2020 0740   CALCIUM 8.4 04/10/2017 0923   CALCIUM 9.0 04/05/2016 0739   ALKPHOS 64 11/15/2020 0740   ALKPHOS 77 04/10/2017 0923   ALKPHOS 91 04/05/2016 0739   AST 15 11/15/2020 0740   AST 34 04/05/2016 0739   ALT 11 11/15/2020 0740   ALT 32 04/10/2017 0923   ALT 40 04/05/2016 0739   BILITOT 0.5 11/15/2020 0740   BILITOT 0.48 04/05/2016 0739      Impression and Plan: Mr. Cullens is 70 year old gentleman with polycythemia vera.  He is on Hydrea.  Again, we will see with increasing Hydrea dose will do for [place].  Is been about a month or so since we increased the dose.  Hopefully, his platelet count will start coming down.  If not, we will have to make further adjustments with the Hydrea.  I am glad that the CT scan turned out  okay.  I still think that his mortality will be based upon his cardiac issues.  We will plan to get him back in another 6 weeks.  Volanda Napoleon, MD 8/22/20228:52 AM

## 2020-12-27 NOTE — Telephone Encounter (Signed)
Appts made and printed per 12/27/20 los  [place]

## 2020-12-30 DIAGNOSIS — E1122 Type 2 diabetes mellitus with diabetic chronic kidney disease: Secondary | ICD-10-CM | POA: Diagnosis not present

## 2020-12-30 DIAGNOSIS — Z794 Long term (current) use of insulin: Secondary | ICD-10-CM | POA: Diagnosis not present

## 2021-01-04 DIAGNOSIS — E1122 Type 2 diabetes mellitus with diabetic chronic kidney disease: Secondary | ICD-10-CM | POA: Diagnosis not present

## 2021-01-05 DIAGNOSIS — R350 Frequency of micturition: Secondary | ICD-10-CM | POA: Diagnosis not present

## 2021-01-05 DIAGNOSIS — R3912 Poor urinary stream: Secondary | ICD-10-CM | POA: Diagnosis not present

## 2021-01-06 NOTE — Progress Notes (Signed)
Remote pacemaker transmission.   

## 2021-01-11 ENCOUNTER — Encounter: Payer: Self-pay | Admitting: Hematology & Oncology

## 2021-01-11 ENCOUNTER — Other Ambulatory Visit (HOSPITAL_BASED_OUTPATIENT_CLINIC_OR_DEPARTMENT_OTHER): Payer: Self-pay

## 2021-01-17 ENCOUNTER — Ambulatory Visit (INDEPENDENT_AMBULATORY_CARE_PROVIDER_SITE_OTHER): Payer: Medicare Other

## 2021-01-17 DIAGNOSIS — I5042 Chronic combined systolic (congestive) and diastolic (congestive) heart failure: Secondary | ICD-10-CM

## 2021-01-17 DIAGNOSIS — Z950 Presence of cardiac pacemaker: Secondary | ICD-10-CM

## 2021-01-18 ENCOUNTER — Other Ambulatory Visit: Payer: Self-pay

## 2021-01-18 ENCOUNTER — Ambulatory Visit: Payer: Medicare Other | Admitting: Pulmonary Disease

## 2021-01-18 ENCOUNTER — Encounter: Payer: Self-pay | Admitting: Pulmonary Disease

## 2021-01-18 VITALS — BP 122/70 | HR 70 | Temp 98.1°F | Ht 70.0 in | Wt 266.8 lb

## 2021-01-18 DIAGNOSIS — G4733 Obstructive sleep apnea (adult) (pediatric): Secondary | ICD-10-CM | POA: Diagnosis not present

## 2021-01-18 DIAGNOSIS — J449 Chronic obstructive pulmonary disease, unspecified: Secondary | ICD-10-CM | POA: Diagnosis not present

## 2021-01-18 MED ORDER — IPRATROPIUM-ALBUTEROL 0.5-2.5 (3) MG/3ML IN SOLN
RESPIRATORY_TRACT | 2 refills | Status: DC
Start: 2021-01-18 — End: 2021-02-17

## 2021-01-18 MED ORDER — BUDESONIDE 0.5 MG/2ML IN SUSP: 0.5000 mg | Freq: Two times a day (BID) | RESPIRATORY_TRACT | 2 refills | Status: DC

## 2021-01-18 NOTE — Progress Notes (Signed)
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 Lamonte Villanueva in 2019 for his moderately severe COPD (FEV1 68% in 2016). He has tried multiple inhalers including Stiolto, Spiriva and Anoro. He is unable to tolerate powdered inhalers due to hoarseness in the past. He does report a diagnosis of OSA but currently not using his CPAP due to having difficulty cleaning/managing device.   08/25/20 Today, he reports the William Villanueva has overall helped however in the last month he was treated with antibiotics or steroids, he is not sure which one. He continues to have productive cough, shortness of breath that is 90% resolved. Minimal wheezing but reports gurgling with inspiratory breath. He believes he caught it from his grandson who he watches twice a week. He has been compliant with Breztri except for a few nights in the week.  01/18/21 Since our last visit, he is unsure of what medication he is on. He is on nebulizer twice a day. Unable to afford performist. He still short of breath. Wheezing on exertion. He was previously on CPAP three years ago and was reported being sick. He reports he has been working hard to lose 55 lbs in the last year by eating smaller portions and avoiding nighttime snacking. He has a little more energy and able to do yardwork. Not routinely walking at this point.  Social History: Former smoker. Quit in 2015  Past Medical History:  Diagnosis Date   AV block, 2nd degree 2015   [employer]ure Quadra pulse generator [id number], model PM 3242   Back pain    Cardiomyopathy (HCC)    Nonischemic 45%.    CHF (congestive heart  failure) ([place])    Gout    Hemochromatosis    Hypertension    Hypospadias 07/08/1950   born with   Nephrolithiasis    Polycythemia vera(238.4)     Allergies  Allergen Reactions   Bupropion Hives and Other (See Comments)   Cephalexin Diarrhea and Other (See Comments)    Caused C-diff, also    Fluoxetine Itching   Ibuprofen Itching   Prednisone Itching and Other (See Comments)    Abdominal pain, also    Temazepam Other (See Comments)    Dizziness    Trazodone And Nefazodone Other (See Comments)    Dizziness   Fluoxetine Hcl Itching   Prozac [Fluoxetine Hcl] Itching     Outpatient Medications Prior to Visit  Medication Sig Dispense Refill   allopurinol (ZYLOPRIM) 300 MG tablet Take 300 mg by mouth daily.     BD PEN NEEDLE NANO U/F 32G X 4 MM MISC      carvedilol (COREG) 12.5 MG tablet TAKE 1 TABLET BY MOUTH  TWICE DAILY WITH MEALS 180 tablet 3   citalopram (CELEXA) 10 MG tablet Take 5 mg by mouth daily.      colestipol (COLESTID) 1 g tablet Take 1 tablet by mouth in the morning, at noon, and at bedtime.     dicyclomine (BENTYL) 20 MG tablet Take 20 mg by mouth 4 (four) times daily.     folic acid (FOLVITE)  1 MG tablet Take 1 mg by mouth daily.     formoterol (PERFOROMIST) 20 MCG/2ML nebulizer solution Take 2 mLs (20 mcg total) by nebulization 2 (two) times daily. 120 mL 11   furosemide (LASIX) 40 MG tablet Take 1 tablet (40 mg total) by mouth daily. 90 tablet 3   gabapentin (NEURONTIN) 300 MG capsule Take 300 mg by mouth daily.     hydroxyurea (HYDREA) 500 MG capsule Take 2 capsules (1,000 mg total) by mouth daily. May take with food to minimize GI side effects. Currently taking 500 mg daily but MD note says may need to increase on 12/27/2020 to twice daily. 180 capsule 5   insulin degludec (TRESIBA FLEXTOUCH) 100 UNIT/ML SOPN FlexTouch Pen Inject 20 Units into the skin daily after breakfast.      ipratropium-albuterol (DUONEB) 0.5-2.5 (3) MG/3ML SOLN 1 vial three times daily and  every 6 hours as needed 360 mL 11   irbesartan (AVAPRO) 75 MG tablet Take 1 tablet by mouth daily.     losartan (COZAAR) 25 MG tablet Take 1 tablet (25 mg total) by mouth daily. 90 tablet 2   Magnesium Oxide 400 MG CAPS Take 2 capsules (800 mg total) by mouth in the morning and at bedtime. 90 capsule 1   omeprazole (PRILOSEC) 20 MG capsule Take 20 mg by mouth 2 (two) times daily before a meal.      oxybutynin (DITROPAN-XL) 10 MG 24 hr tablet Take 10 mg by mouth daily.     rosuvastatin (CRESTOR) 5 MG tablet Take 5 mg by mouth daily.     spironolactone (ALDACTONE) 25 MG tablet TAKE ONE-HALF TABLET BY  MOUTH DAILY 45 tablet 3   tamsulosin (FLOMAX) 0.4 MG CAPS capsule Take 0.4 mg by mouth 2 (two) times daily.      traMADol (ULTRAM) 50 MG tablet Take 50 mg by mouth as needed.     albuterol (VENTOLIN HFA) 108 (90 Base) MCG/ACT inhaler Inhale 2 puffs into the lungs every 6 (six) hours as needed for wheezing or shortness of breath. (Patient not taking: No sig reported) 8 g 6   Facility-Administered Medications Prior to Visit  Medication Dose Route Frequency Provider Last Rate Last Admin   0.9 %  sodium chloride infusion   Intravenous Continuous Volanda Napoleon, MD   Stopped at 05/16/13 1115    Review of Systems  Constitutional:  Positive for weight loss. Negative for chills, diaphoresis, fever and malaise/fatigue.       Intentional  HENT:  Negative for congestion.   Respiratory:  Positive for shortness of breath and wheezing. Negative for cough, hemoptysis and sputum production.   Cardiovascular:  Negative for chest pain, palpitations and leg swelling.    Objective:   Vitals:   01/18/21 1048  BP: 122/70  Pulse: 70  Temp: 98.1 F (36.7 C)  TempSrc: Oral  SpO2: 99%  Weight: 266 lb 12.8 oz (121 kg)  Height: 5' 10"  (1.778 m)    Body mass index is 38.28 kg/m.  Physical Exam: General: Well-appearing, no acute distress HENT: William Villanueva, AT Eyes: EOMI, no scleral icterus Respiratory: Clear to  auscultation bilaterally.  No crackles, wheezing or rales Cardiovascular: RRR, -M/R/G, no JVD Extremities:-Edema,-tenderness Neuro: AAO x4, CNII-XII grossly intact Psych: Normal mood, normal affect  Data Reviewed:  Imaging: CXR 02/04/20 - No infiltrate, effusion or edema. AICD present CT Chest Lung Screen 11/24/20 - Tiny calcified and noncalcified granulomas with largest 34m. Mild centrilobular and paraseptal emphysema  PFT: 06/04/17 FVC 3.10 (  67%) FEV1 2.17 (63%) Ratio 65  TLC 86% RV/TLC 136% DLCO 48% Interpretation: Moderate obstructive defect with air trapping an reduced gas exchange consistent with emphysema. No significant bronchodilator response.  Labs: CBC    Component Value Date/Time   WBC 7.6 12/27/2020 0803   WBC 10.0 02/18/2020 0310   RBC 4.27 12/27/2020 0803   HGB 12.8 (L) 12/27/2020 0803   HGB 13.5 04/10/2017 0923   HCT 41.4 12/27/2020 0803   HCT 42.7 04/10/2017 0923   PLT 533 (H) 12/27/2020 0803   PLT 563 (H) 04/10/2017 0923   MCV 97.0 12/27/2020 0803   MCV 96 04/10/2017 0923   MCH 30.0 12/27/2020 0803   MCHC 30.9 12/27/2020 0803   RDW 18.6 (H) 12/27/2020 0803   RDW 16.5 (H) 04/10/2017 0923   LYMPHSABS 1.2 12/27/2020 0803   LYMPHSABS 1.6 04/10/2017 0923   MONOABS 0.5 12/27/2020 0803   EOSABS 0.1 12/27/2020 0803   EOSABS 0.3 04/10/2017 0923   BASOSABS 0.2 (H) 12/27/2020 0803   BASOSABS 0.4 (H) 04/10/2017 0923   Absolute eosinophils  04/10/17 - 300 05/14/20 - 100  12/27/20 - 100     Assessment & Plan:   Discussion: 70 year old male former smoker with emphysema/COPD, OSA not on CPAP, chronic combined heart failure, hx 2nd degree HB s/p PM who presents for follow-up. Last exacerbation 07/2020 outpatient. Discussed benefits of Pulmonary rehab and patient agreeable to referral. Discussed evaluation and treatment for sleep apnea however declined.  Emphysema/COPD GOLD B/D - remains symptomatic, not in active exacerbation --CONTINUE Duonebs THREE times a  day --START Pulmicort TWO times a day --Refer to Pulmonary Rehab  [facility] --Declined evaluation and treatment  Health Maintenance Immunization History  Administered Date(s) Administered   DTaP 01/26/2016   Influenza Split 01/21/2015   Influenza, High Dose Seasonal PF 02/23/2017, 03/22/2018, 01/10/2019, 03/01/2020   Influenza,inj,Quad PF,6+ Mos 01/21/2015, 01/27/2016, 02/05/2017   Influenza-Unspecified 01/26/2016   PFIZER Comirnaty(Gray Top)Covid-19 Tri-Sucrose Vaccine 09/10/2020   PFIZER(Purple Top)SARS-COV-2 Vaccination 06/21/2019, 07/13/2019, 04/16/2020   Pneumococcal Conjugate-13 01/27/2016, 01/09/2018   Pneumococcal Polysaccharide-23 01/21/2015   Pneumococcal-Unspecified 01/26/2016   Tdap 01/26/2011, 01/27/2016, 08/09/2019   Zoster, Live 08/29/2019   CT Lung Screen - UTD. Due 11/2021  Orders Placed This Encounter  Procedures   AMB referral to pulmonary rehabilitation    Referral Priority:   Routine    Referral Type:   Consultation    Number of Visits Requested:   1    Meds ordered this encounter  Medications   ipratropium-albuterol (DUONEB) 0.5-2.5 (3) MG/3ML SOLN    Sig: 1 vial three times daily and every 6 hours as needed    Dispense:  360 mL    Refill:  2    Dx J44.9   budesonide (PULMICORT) 0.5 MG/2ML nebulizer solution    Sig: Take 2 mLs (0.5 mg total) by nebulization 2 (two) times daily.    Dispense:  120 mL    Refill:  2    J44.9     Return in about 3 months (around 04/19/2021).  I have spent a total time of 32-minutes on the day of the appointment reviewing prior documentation, coordinating care and discussing medical diagnosis and plan with the patient/family. Past medical history, allergies, medications were reviewed. Pertinent imaging, labs and tests included in this note have been reviewed and interpreted independently by me.   [place], MD [facility] 01/18/2021 10:48 AM  Office Number [phone]

## 2021-01-18 NOTE — Patient Instructions (Signed)
Emphysema/COPD GOLD D - remains symptomatic, not in active exacerbation --CONTINUE Duonebs THREE times a day --START Pulmicort TWO times a day --Refer to Pulmonary Rehab  [facility] --Declined evaluation and treatment  Follow-up with me in 3 months

## 2021-01-19 ENCOUNTER — Encounter (HOSPITAL_COMMUNITY): Payer: Self-pay | Admitting: *Deleted

## 2021-01-19 NOTE — Progress Notes (Signed)
Received referral from Dr. Loanne Drilling for this pt to participate in pulmonary rehab with the the diagnosis of COPD Stage 2.  Pt with PFT in 2019 which shows FEV1/FVC - 70 post and pre 65; post pred FEV1 63. Clinical review of pt follow up appt on 9/13 Pulmonary office note.  Pt with Covid Risk Score - 8. Pt appropriate for scheduling for Pulmonary rehab.  Will forward to support staff for scheduling and verification of insurance eligibility/benefits with pt consent. Cherre Huger, BSN Cardiac and Training and development officer

## 2021-01-20 DIAGNOSIS — E7800 Pure hypercholesterolemia, unspecified: Secondary | ICD-10-CM | POA: Diagnosis not present

## 2021-01-20 DIAGNOSIS — N183 Chronic kidney disease, stage 3 unspecified: Secondary | ICD-10-CM | POA: Diagnosis not present

## 2021-01-20 DIAGNOSIS — I10 Essential (primary) hypertension: Secondary | ICD-10-CM | POA: Diagnosis not present

## 2021-01-20 DIAGNOSIS — J449 Chronic obstructive pulmonary disease, unspecified: Secondary | ICD-10-CM | POA: Diagnosis not present

## 2021-01-20 DIAGNOSIS — K219 Gastro-esophageal reflux disease without esophagitis: Secondary | ICD-10-CM | POA: Diagnosis not present

## 2021-01-20 DIAGNOSIS — I119 Hypertensive heart disease without heart failure: Secondary | ICD-10-CM | POA: Diagnosis not present

## 2021-01-20 DIAGNOSIS — I5043 Acute on chronic combined systolic (congestive) and diastolic (congestive) heart failure: Secondary | ICD-10-CM | POA: Diagnosis not present

## 2021-01-20 DIAGNOSIS — E1122 Type 2 diabetes mellitus with diabetic chronic kidney disease: Secondary | ICD-10-CM | POA: Diagnosis not present

## 2021-01-21 ENCOUNTER — Other Ambulatory Visit: Payer: Self-pay | Admitting: Cardiology

## 2021-01-21 DIAGNOSIS — R0602 Shortness of breath: Secondary | ICD-10-CM

## 2021-01-21 NOTE — Telephone Encounter (Signed)
D/C 03/25/20

## 2021-01-21 NOTE — Progress Notes (Signed)
EPIC Encounter for ICM Monitoring  Patient Name: William Villanueva is a 70 y.o. male Date: 01/21/2021 Primary Care Physican: Antony Contras, MD Primary Cardiologist: Stanford Breed Electrophysiologist: Vergie Living Pacing: 96%            01/21/2021 Weight: 260-261 lbs                                                            Spoke with patient and reports feeling well at this time. Heart failure questions reviewed. Pt asymptomatic for fluid.    Pt is making changes in diet and losing weight.  His goal weight is 220 lbs.               CorVue thoracic impedance suggesting dryness since 01/09/2021.     Prescribed:   Furosemide 40 mg take 1 tablet daily.   Patient takes differently: taking Furosemide every other day. Spironolactone 25 mg take 0.5 tablet (12.5 mg total) once a day   Labs: 11/15/2020 Creatinine 1.30, BUN 23, Potassium 4.4, Sodium 137, GFR 59 09/13/2020 Creatinine 1.24, BUN 17, Potassium 4.1, Sodium 138, GFR >60 07/13/2020 Creatinine 1.44, BUN 24, Potassium 4.5, Sodium 135, GFR 53 05/14/2020 Creatinine 1.02, BUN 18, Potassium 4.2, Sodium 141, GFR >60  A complete set of results can be found in Results Review.   Recommendations:  No changes and encouraged to call if experiencing any fluid symptoms.   Follow-up plan: ICM clinic phone appointment on 02/21/2021.   91 day device clinic remote transmission 03/17/2021.     EP/Cardiology Office Visits:  Recall 03/07/2021 with Dr Stanford Breed.  Recall 08/03/2021 with Dr Caryl Comes.   Copy of ICM check sent to Dr. Caryl Comes.      3 month ICM trend: 01/17/2021.    1 Year ICM trend:       Rosalene Billings, RN 01/21/2021 1:05 PM

## 2021-02-01 ENCOUNTER — Telehealth: Payer: Self-pay | Admitting: Pulmonary Disease

## 2021-02-01 ENCOUNTER — Encounter: Payer: Self-pay | Admitting: Pulmonary Disease

## 2021-02-01 NOTE — Telephone Encounter (Signed)
Called and spoke with pt and he stated that he has the original box of the albuterol that he received when he first got his nebulizer.  He stated that this is the plain albuterol.  He recently picked up the duoneb from the pharmacy.  He stated that he realized that these have the same meds in them and did not want to use them together.  He does not have the pulmicort.  Pt wanted to reach out to [place] to see what meds he should be taking and which ones he should not.  He stated that he only has one pack left of the plain albuterol.  JE please advise. Thanks

## 2021-02-03 NOTE — Telephone Encounter (Signed)
I have attempted to call however no answer. Unable to leave voicemail due to being full.  He should be taking Duonebs. This contains albuterol as one of the ingredients. He will need to stop taking Albuterol.  Plan  --CONTINUE Duonebs THREE times a day. This can also be taken as needed --START Pulmicort TWO times a day (Staff please check what the delay is his ability to receive this medication) --Refer to Pulmonary Rehab

## 2021-02-08 ENCOUNTER — Other Ambulatory Visit: Payer: Medicare Other

## 2021-02-08 ENCOUNTER — Ambulatory Visit: Payer: Medicare Other | Admitting: Hematology & Oncology

## 2021-02-08 MED ORDER — BUDESONIDE 0.5 MG/2ML IN SUSP: 0.5000 mg | Freq: Two times a day (BID) | RESPIRATORY_TRACT | 2 refills | Status: DC

## 2021-02-08 NOTE — Telephone Encounter (Signed)
Patient is returning phone call. Patient phone number is [phone].

## 2021-02-08 NOTE — Telephone Encounter (Signed)
Called and spoke with Patient.  Patient stated received albuterol nebs with neb machine and Duo Nebs from [place].  Advised Patient albuterol is in duo nebs, so Patient only needs duo nebs, not albuterol. Advised Patient of Pulmicort instructions.  Understanding stated. Pulmicort prescription sent to [place].  Advised Patient to call office with any issues. Nothing further at this time.

## 2021-02-14 ENCOUNTER — Encounter (HOSPITAL_COMMUNITY): Payer: Self-pay

## 2021-02-14 ENCOUNTER — Telehealth (HOSPITAL_COMMUNITY): Payer: Self-pay

## 2021-02-14 NOTE — Telephone Encounter (Signed)
Attempted to contact pt in regards to PR.  Mailed letter

## 2021-02-14 NOTE — Telephone Encounter (Signed)
Pt insurance is active and benefits verified through [facility]. Co-pay $20.00, DED $0.00/$0.00 met, out of pocket $4,500.00/$966.53 met, co-insurance 0%. No pre-authorization required. [place] [employer], 02/10/21 @ 11:54AM, MMG#[id number]   Will contact patient to see if he is interested in the Pulmonary Rehab Program.

## 2021-02-16 ENCOUNTER — Telehealth (HOSPITAL_COMMUNITY): Payer: Self-pay

## 2021-02-16 ENCOUNTER — Telehealth: Payer: Self-pay | Admitting: Pulmonary Disease

## 2021-02-16 DIAGNOSIS — J449 Chronic obstructive pulmonary disease, unspecified: Secondary | ICD-10-CM | POA: Diagnosis not present

## 2021-02-16 DIAGNOSIS — E7800 Pure hypercholesterolemia, unspecified: Secondary | ICD-10-CM | POA: Diagnosis not present

## 2021-02-16 DIAGNOSIS — I119 Hypertensive heart disease without heart failure: Secondary | ICD-10-CM | POA: Diagnosis not present

## 2021-02-16 DIAGNOSIS — I5042 Chronic combined systolic (congestive) and diastolic (congestive) heart failure: Secondary | ICD-10-CM | POA: Diagnosis not present

## 2021-02-16 DIAGNOSIS — E1122 Type 2 diabetes mellitus with diabetic chronic kidney disease: Secondary | ICD-10-CM | POA: Diagnosis not present

## 2021-02-16 DIAGNOSIS — I10 Essential (primary) hypertension: Secondary | ICD-10-CM | POA: Diagnosis not present

## 2021-02-16 DIAGNOSIS — M069 Rheumatoid arthritis, unspecified: Secondary | ICD-10-CM | POA: Diagnosis not present

## 2021-02-16 DIAGNOSIS — N183 Chronic kidney disease, stage 3 unspecified: Secondary | ICD-10-CM | POA: Diagnosis not present

## 2021-02-16 DIAGNOSIS — K219 Gastro-esophageal reflux disease without esophagitis: Secondary | ICD-10-CM | POA: Diagnosis not present

## 2021-02-16 MED ORDER — BUDESONIDE 0.5 MG/2ML IN SUSP: 0.5000 mg | Freq: Two times a day (BID) | RESPIRATORY_TRACT | 2 refills | Status: DC

## 2021-02-16 NOTE — Telephone Encounter (Signed)
Call made to patient, confirmed DOB. Patient states the rehab program is 1-3 months behind and he works Mon-Thursday. The program is 2 days a week on the days that he works and he will not be able to do the program. He states he is receiving albuterol through [facility] and its only 12/month.    I made him aware we could try sending to [facility] to see if it is cheaper. Voiced understanding.   Call made to [facility]. Rep states it normally takes about 30 minutes for the electronic scripts to process through the system.   Will hold in triage. Will need to call [facility] and ensure receipt of script and ask for pricing.

## 2021-02-16 NOTE — Telephone Encounter (Signed)
Pt is unable to do the pulmonary rehab program do to work. Closed referral.

## 2021-02-17 DIAGNOSIS — J449 Chronic obstructive pulmonary disease, unspecified: Secondary | ICD-10-CM | POA: Diagnosis not present

## 2021-02-17 DIAGNOSIS — R051 Acute cough: Secondary | ICD-10-CM | POA: Diagnosis not present

## 2021-02-17 DIAGNOSIS — Z03818 Encounter for observation for suspected exposure to other biological agents ruled out: Secondary | ICD-10-CM | POA: Diagnosis not present

## 2021-02-17 MED ORDER — IPRATROPIUM-ALBUTEROL 0.5-2.5 (3) MG/3ML IN SOLN: RESPIRATORY_TRACT | 2 refills | Status: DC

## 2021-02-17 NOTE — Telephone Encounter (Signed)
Rx for duoneb sent to [facility]  I faxed his ov note and insurance info to them  They will contact pt with price  Pt aware  Nothing further needed

## 2021-02-21 ENCOUNTER — Ambulatory Visit (INDEPENDENT_AMBULATORY_CARE_PROVIDER_SITE_OTHER): Payer: Medicare Other

## 2021-02-21 DIAGNOSIS — Z950 Presence of cardiac pacemaker: Secondary | ICD-10-CM | POA: Diagnosis not present

## 2021-02-21 DIAGNOSIS — I5042 Chronic combined systolic (congestive) and diastolic (congestive) heart failure: Secondary | ICD-10-CM | POA: Diagnosis not present

## 2021-02-23 ENCOUNTER — Telehealth: Payer: Self-pay

## 2021-02-23 NOTE — Progress Notes (Signed)
EPIC Encounter for ICM Monitoring  Patient Name: William Villanueva is a 70 y.o. male Date: 02/23/2021 Primary Care Physican: Antony Contras, MD Primary Cardiologist: Stanford Breed Electrophysiologist: Vergie Living Pacing: 95%            01/21/2021 Weight: 260-261 lbs                                                            Attempted call to patient and unable to reach.  Left detailed message per DPR regarding transmission. Transmission reviewed.               CorVue thoracic impedance normal but was suggesting possible fluid accumulation 10/11-10/15.   Prescribed:   Furosemide 40 mg take 1 tablet daily.   Patient takes differently: taking Furosemide every other day. Spironolactone 25 mg take 0.5 tablet (12.5 mg total) once a day   Labs: 11/15/2020 Creatinine 1.30, BUN 23, Potassium 4.4, Sodium 137, GFR 59 09/13/2020 Creatinine 1.24, BUN 17, Potassium 4.1, Sodium 138, GFR >60 07/13/2020 Creatinine 1.44, BUN 24, Potassium 4.5, Sodium 135, GFR 53 05/14/2020 Creatinine 1.02, BUN 18, Potassium 4.2, Sodium 141, GFR >60  A complete set of results can be found in Results Review.   Recommendations:  Left voice mail with ICM number and encouraged to call if experiencing any fluid symptoms.   Follow-up plan: ICM clinic phone appointment on 04/04/2021.   91 day device clinic remote transmission 03/17/2021.     EP/Cardiology Office Visits:  Recall 03/07/2021 with Dr Stanford Breed.  Recall 08/03/2021 with Dr Caryl Comes.   Copy of ICM check sent to Dr. Caryl Comes.   3 month ICM trend: 02/21/2021.    1 Year ICM trend:       Rosalene Billings, RN 02/23/2021 10:30 AM

## 2021-02-23 NOTE — Telephone Encounter (Signed)
Remote ICM transmission received.  Attempted call to patient regarding ICM remote transmission and left detailed message per DPR.  Advised to return call for any fluid symptoms or questions. Next ICM remote transmission scheduled 04/04/2021.

## 2021-03-07 ENCOUNTER — Telehealth: Payer: Self-pay | Admitting: Internal Medicine

## 2021-03-07 ENCOUNTER — Other Ambulatory Visit (HOSPITAL_BASED_OUTPATIENT_CLINIC_OR_DEPARTMENT_OTHER): Payer: Self-pay

## 2021-03-07 ENCOUNTER — Encounter: Payer: Self-pay | Admitting: Hematology & Oncology

## 2021-03-07 ENCOUNTER — Ambulatory Visit: Payer: Medicare Other | Attending: Internal Medicine

## 2021-03-07 DIAGNOSIS — Z23 Encounter for immunization: Secondary | ICD-10-CM

## 2021-03-07 MED ORDER — INFLUENZA VAC A&B SA ADJ QUAD 0.5 ML IM PRSY
PREFILLED_SYRINGE | INTRAMUSCULAR | 0 refills | Status: DC
  Filled 2021-03-07: qty 0.5, 1d supply, fill #0

## 2021-03-07 NOTE — Telephone Encounter (Signed)
Patient callled and wanted to know if he should still take Magnesium Oxide 400 MG CAPS

## 2021-03-07 NOTE — Progress Notes (Signed)
   Covid-19 Vaccination Clinic  Name:  Rodert Hinch    MRN: [id number] DOB: 1950/12/21  03/07/2021  Mr. Silman Brooke Bonito was observed post Covid-19 immunization for 15 minutes without incident. He was provided with Vaccine Information Sheet and instruction to access the V-Safe system.   Mr. Dhilan Brauer was instructed to call 911 with any severe reactions post vaccine: Difficulty breathing  Swelling of face and throat  A fast heartbeat  A bad rash all over body  Dizziness and weakness   Immunizations Administered     Name Date Dose VIS Date Route   Pfizer Covid-19 Vaccine Bivalent Booster 03/07/2021 12:08 PM 0.3 mL 01/05/2021 Intramuscular   Manufacturer: [place]   Lot: [id number]   [place]: [phone]

## 2021-03-08 NOTE — Telephone Encounter (Signed)
Spoke with pt and advised Magnesium Oxide remains on his medication list and Dr Caryl Comes has not discontinued this medication.  Pt verbalizes understanding and thanked Therapist, sports for the phone call.

## 2021-03-09 ENCOUNTER — Telehealth: Payer: Self-pay | Admitting: Internal Medicine

## 2021-03-09 NOTE — Telephone Encounter (Signed)
Left message for patient with Dr Youlanda Roys recommendations.

## 2021-03-09 NOTE — Telephone Encounter (Signed)
Katie from [facility] called stating patient is not taking his Lasix daily as he should.  He is only taking it 3 times a week.  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   He said he would like to take 1/2 a pill in the morning and 1/2 at night if that would be okay.  You can call patient directly or Katie at [phone]

## 2021-03-11 ENCOUNTER — Inpatient Hospital Stay: Payer: Medicare Other | Admitting: Hematology & Oncology

## 2021-03-11 ENCOUNTER — Inpatient Hospital Stay: Payer: Medicare Other

## 2021-03-11 NOTE — Telephone Encounter (Signed)
William Villanueva is returning Debra's call.

## 2021-03-14 NOTE — Telephone Encounter (Signed)
William Villanueva is calling to find out if patient was contacted and to find out what the recommendation was.

## 2021-03-16 NOTE — Telephone Encounter (Signed)
Spoke with William Villanueva, aware I have spoke with the patient.

## 2021-03-17 ENCOUNTER — Ambulatory Visit (INDEPENDENT_AMBULATORY_CARE_PROVIDER_SITE_OTHER): Payer: Medicare Other

## 2021-03-17 DIAGNOSIS — I441 Atrioventricular block, second degree: Secondary | ICD-10-CM

## 2021-03-17 LAB — CUP PACEART REMOTE DEVICE CHECK
Battery Remaining Longevity: 9 mo
Battery Remaining Percentage: 10 %
Battery Voltage: 2.84 V
Brady Statistic AP VP Percent: 7.3 %
Brady Statistic AP VS Percent: 1 %
Brady Statistic AS VP Percent: 88 %
Brady Statistic AS VS Percent: 1 %
Brady Statistic RA Percent Paced: 4.4 %
Date Time Interrogation Session: 20221110023234
Implantable Lead Implant Date: 20151223
Implantable Lead Implant Date: 20151223
Implantable Lead Implant Date: 20151223
Implantable Lead Location: 753858
Implantable Lead Location: 753859
Implantable Lead Location: 753860
Implantable Pulse Generator Implant Date: 20151223
Lead Channel Impedance Value: 430 Ohm
Lead Channel Impedance Value: 460 Ohm
Lead Channel Impedance Value: 560 Ohm
Lead Channel Pacing Threshold Amplitude: 1.25 V
Lead Channel Pacing Threshold Amplitude: 1.25 V
Lead Channel Pacing Threshold Amplitude: 1.5 V
Lead Channel Pacing Threshold Pulse Width: 0.4 ms
Lead Channel Pacing Threshold Pulse Width: 0.4 ms
Lead Channel Pacing Threshold Pulse Width: 0.7 ms
Lead Channel Sensing Intrinsic Amplitude: 2.4 mV
Lead Channel Sensing Intrinsic Amplitude: 5.9 mV
Lead Channel Setting Pacing Amplitude: 2.25 V
Lead Channel Setting Pacing Amplitude: 2.5 V
Lead Channel Setting Pacing Amplitude: 2.5 V
Lead Channel Setting Pacing Pulse Width: 0.4 ms
Lead Channel Setting Pacing Pulse Width: 0.7 ms
Lead Channel Setting Sensing Sensitivity: 2 mV
Pulse Gen Model: 3242
Pulse Gen Serial Number: 7701275

## 2021-03-25 NOTE — Progress Notes (Signed)
Remote pacemaker transmission.   

## 2021-03-28 ENCOUNTER — Other Ambulatory Visit (HOSPITAL_BASED_OUTPATIENT_CLINIC_OR_DEPARTMENT_OTHER): Payer: Self-pay

## 2021-03-28 ENCOUNTER — Encounter: Payer: Self-pay | Admitting: Hematology & Oncology

## 2021-03-28 MED ORDER — PFIZER COVID-19 VAC BIVALENT 30 MCG/0.3ML IM SUSP
INTRAMUSCULAR | 0 refills | Status: DC
  Filled 2021-03-28: qty 0.3, 1d supply, fill #0

## 2021-04-04 ENCOUNTER — Ambulatory Visit (INDEPENDENT_AMBULATORY_CARE_PROVIDER_SITE_OTHER): Payer: Medicare Other

## 2021-04-04 DIAGNOSIS — Z950 Presence of cardiac pacemaker: Secondary | ICD-10-CM

## 2021-04-04 DIAGNOSIS — I5042 Chronic combined systolic (congestive) and diastolic (congestive) heart failure: Secondary | ICD-10-CM

## 2021-04-05 DIAGNOSIS — D631 Anemia in chronic kidney disease: Secondary | ICD-10-CM | POA: Diagnosis not present

## 2021-04-05 DIAGNOSIS — N183 Chronic kidney disease, stage 3 unspecified: Secondary | ICD-10-CM | POA: Diagnosis not present

## 2021-04-05 DIAGNOSIS — E7800 Pure hypercholesterolemia, unspecified: Secondary | ICD-10-CM | POA: Diagnosis not present

## 2021-04-05 DIAGNOSIS — M069 Rheumatoid arthritis, unspecified: Secondary | ICD-10-CM | POA: Diagnosis not present

## 2021-04-05 DIAGNOSIS — E1122 Type 2 diabetes mellitus with diabetic chronic kidney disease: Secondary | ICD-10-CM | POA: Diagnosis not present

## 2021-04-05 DIAGNOSIS — M1030 Gout due to renal impairment, unspecified site: Secondary | ICD-10-CM | POA: Diagnosis not present

## 2021-04-05 DIAGNOSIS — K219 Gastro-esophageal reflux disease without esophagitis: Secondary | ICD-10-CM | POA: Diagnosis not present

## 2021-04-05 DIAGNOSIS — J449 Chronic obstructive pulmonary disease, unspecified: Secondary | ICD-10-CM | POA: Diagnosis not present

## 2021-04-05 DIAGNOSIS — I10 Essential (primary) hypertension: Secondary | ICD-10-CM | POA: Diagnosis not present

## 2021-04-06 NOTE — Progress Notes (Signed)
EPIC Encounter for ICM Monitoring  Patient Name: William Villanueva is a 70 y.o. male Date: 04/06/2021 Primary Care Physican: Antony Contras, MD Primary Cardiologist: Stanford Breed Electrophysiologist: Vergie Living Pacing: 95%            04/06/2021 Weight: 256-258 lbs  Battery Longevity: 8.4 months                                                            Spoke with patient and heart failure questions reviewed.  Pt asymptomatic for fluid accumulation and feeling well.  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  He is trying to take Furosemide twice a day.  He has SOB when he has fluid but none at this time.               CorVue thoracic impedance normal but was suggesting possible fluid accumulation 11/9-11/16.   Prescribed:   Furosemide 20 mg take 1 tablet daily.   (03/09/21 phone note: Dr Stanford Breed approved of taking 0.5 tablet twice a day as requested by pt.) Spironolactone 25 mg take 0.5 tablet (12.5 mg total) once a day   Labs: 11/15/2020 Creatinine 1.30, BUN 23, Potassium 4.4, Sodium 137, GFR 59 09/13/2020 Creatinine 1.24, BUN 17, Potassium 4.1, Sodium 138, GFR >60 07/13/2020 Creatinine 1.44, BUN 24, Potassium 4.5, Sodium 135, GFR 53 05/14/2020 Creatinine 1.02, BUN 18, Potassium 4.2, Sodium 141, GFR >60  A complete set of results can be found in Results Review.   Recommendations:  No changes and encouraged to call if experiencing any fluid symptoms.   Follow-up plan: ICM clinic phone appointment on 05/13/2021.   91 day device clinic remote transmission 06/16/2021.     EP/Cardiology Office Visits:  Recall 03/07/2021 with Dr Stanford Breed.  Recall 08/03/2021 with Dr Caryl Comes.   Copy of ICM check sent to Dr. Caryl Comes.   3 month ICM trend: 04/04/2021.    12-14 Month ICM trend:       Rosalene Billings, RN 04/06/2021 9:49 AM

## 2021-04-07 ENCOUNTER — Encounter: Payer: Self-pay | Admitting: Hematology & Oncology

## 2021-04-07 ENCOUNTER — Inpatient Hospital Stay: Payer: Medicare Other | Attending: Hematology & Oncology

## 2021-04-07 ENCOUNTER — Telehealth: Payer: Self-pay | Admitting: *Deleted

## 2021-04-07 ENCOUNTER — Inpatient Hospital Stay (HOSPITAL_BASED_OUTPATIENT_CLINIC_OR_DEPARTMENT_OTHER): Payer: Medicare Other | Admitting: Hematology & Oncology

## 2021-04-07 ENCOUNTER — Other Ambulatory Visit: Payer: Self-pay

## 2021-04-07 VITALS — BP 113/74 | HR 76 | Temp 97.6°F | Resp 18 | Wt 257.0 lb

## 2021-04-07 DIAGNOSIS — I441 Atrioventricular block, second degree: Secondary | ICD-10-CM | POA: Insufficient documentation

## 2021-04-07 DIAGNOSIS — D45 Polycythemia vera: Secondary | ICD-10-CM

## 2021-04-07 DIAGNOSIS — Z950 Presence of cardiac pacemaker: Secondary | ICD-10-CM | POA: Insufficient documentation

## 2021-04-07 DIAGNOSIS — Z7982 Long term (current) use of aspirin: Secondary | ICD-10-CM | POA: Insufficient documentation

## 2021-04-07 LAB — CBC WITH DIFFERENTIAL (CANCER CENTER ONLY)
Abs Immature Granulocytes: 0.1 10*3/uL — ABNORMAL HIGH (ref 0.00–0.07)
Basophils Absolute: 0.2 10*3/uL — ABNORMAL HIGH (ref 0.0–0.1)
Basophils Relative: 2 %
Eosinophils Absolute: 0.1 10*3/uL (ref 0.0–0.5)
Eosinophils Relative: 1 %
HCT: 37.3 % — ABNORMAL LOW (ref 39.0–52.0)
Hemoglobin: 12.2 g/dL — ABNORMAL LOW (ref 13.0–17.0)
Immature Granulocytes: 1 %
Lymphocytes Relative: 9 %
Lymphs Abs: 0.9 10*3/uL (ref 0.7–4.0)
MCH: 34.6 pg — ABNORMAL HIGH (ref 26.0–34.0)
MCHC: 32.7 g/dL (ref 30.0–36.0)
MCV: 105.7 fL — ABNORMAL HIGH (ref 80.0–100.0)
Monocytes Absolute: 0.7 10*3/uL (ref 0.1–1.0)
Monocytes Relative: 7 %
Neutro Abs: 7.7 10*3/uL (ref 1.7–7.7)
Neutrophils Relative %: 80 %
Platelet Count: 622 10*3/uL — ABNORMAL HIGH (ref 150–400)
RBC: 3.53 MIL/uL — ABNORMAL LOW (ref 4.22–5.81)
RDW: 17 % — ABNORMAL HIGH (ref 11.5–15.5)
WBC Count: 9.5 10*3/uL (ref 4.0–10.5)
nRBC: 0.2 % (ref 0.0–0.2)

## 2021-04-07 LAB — CMP (CANCER CENTER ONLY)
ALT: 11 U/L (ref 0–44)
AST: 11 U/L — ABNORMAL LOW (ref 15–41)
Albumin: 3.9 g/dL (ref 3.5–5.0)
Alkaline Phosphatase: 56 U/L (ref 38–126)
Anion gap: 7 (ref 5–15)
BUN: 23 mg/dL (ref 8–23)
CO2: 26 mmol/L (ref 22–32)
Calcium: 9.1 mg/dL (ref 8.9–10.3)
Chloride: 106 mmol/L (ref 98–111)
Creatinine: 1.18 mg/dL (ref 0.61–1.24)
GFR, Estimated: >60 mL/min (ref >60)
Glucose, Bld: 120 mg/dL — ABNORMAL HIGH (ref 70–99)
Potassium: 4 mmol/L (ref 3.5–5.1)
Sodium: 139 mmol/L (ref 135–145)
Total Bilirubin: 0.5 mg/dL (ref 0.3–1.2)
Total Protein: 6.4 g/dL — ABNORMAL LOW (ref 6.5–8.1)

## 2021-04-07 LAB — IRON AND TIBC
Iron: 73 ug/dL (ref 42–163)
Saturation Ratios: 20 % (ref 20–55)
TIBC: 370 ug/dL (ref 202–409)
UIBC: 297 ug/dL (ref 117–376)

## 2021-04-07 LAB — FERRITIN: Ferritin: 62 ng/mL (ref 24–336)

## 2021-04-07 LAB — SAVE SMEAR(SSMR), FOR PROVIDER SLIDE REVIEW

## 2021-04-07 LAB — LACTATE DEHYDROGENASE: LDH: 209 U/L — ABNORMAL HIGH (ref 98–192)

## 2021-04-07 NOTE — Progress Notes (Signed)
Hematology and Oncology Follow Up Visit  William Villanueva [id number] Apr 22, 1951 70 y.o. 04/07/2021   Principle Diagnosis:  Polycythemia vera-JAK2 positive Heart block-Mobitz II  Current Therapy:   Hydrea 500 mg p.o.TID -dose changed on 04/07/2021 Aspirin 81 mg by mouth daily Phlebotomy to maintain hematocrit below 45%     Interim History:  [name] is back for followup.  He really looks good.[name]  He is working.  He works for a truck Research scientist (life sciences).  So happy that he is able to work.  He had a nice Thanksgiving.  Unfortunately, his wife passed out after having Thanksgiving dinner.  She did not go to the hospital.  She the hospital.  Maybe her blood pressure got little bit on the low side.  He does have the back issues.  He has not been as bad.  He said he recently had some injections.  He does have a pacemaker.  There is been no cardiac issues.  He has had no nausea or vomiting.  There is been no change in bowel bladder habits.  He has had no rashes.  There is on Lasix daily.  He is taking Lasix.  However, this is certainly helping his volume overload and also  Overall, I would say his performance status is ECOG 1.    Medications:  Current Outpatient Medications:    allopurinol (ZYLOPRIM) 300 MG tablet, Take 300 mg by mouth daily., Disp: , Rfl:    BD PEN NEEDLE NANO U/F 32G X 4 MM MISC, , Disp: , Rfl:    budesonide (PULMICORT) 0.5 MG/2ML nebulizer solution, Take 2 mLs (0.5 mg total) by nebulization 2 (two) times daily., Disp: 120 mL, Rfl: 2   carvedilol (COREG) 12.5 MG tablet, TAKE 1 TABLET BY MOUTH  TWICE DAILY WITH MEALS, Disp: 180 tablet, Rfl: 3   citalopram (CELEXA) 10 MG tablet, Take 5 mg by mouth daily. , Disp: , Rfl:    colestipol (COLESTID) 1 g tablet, Take 1 tablet by mouth in the morning, at noon, and at bedtime., Disp: , Rfl:    COVID-19 mRNA bivalent vaccine, Pfizer, (PFIZER COVID-19 VAC BIVALENT) injection, Inject into the muscle., Disp: 0.3 mL, Rfl: 0    dicyclomine (BENTYL) 20 MG tablet, Take 20 mg by mouth 4 (four) times daily., Disp: , Rfl:    folic acid (FOLVITE) 1 MG tablet, Take 1 mg by mouth daily., Disp: , Rfl:    furosemide (LASIX) 20 MG tablet, TAKE 1 TABLET BY MOUTH  DAILY, Disp: 90 tablet, Rfl: 3   gabapentin (NEURONTIN) 300 MG capsule, Take 300 mg by mouth daily., Disp: , Rfl:    hydroxyurea (HYDREA) 500 MG capsule, Take 2 capsules (1,000 mg total) by mouth daily. May take with food to minimize GI side effects. Currently taking 500 mg daily but MD note says may need to increase on 12/27/2020 to twice daily., Disp: 180 capsule, Rfl: 5   influenza vaccine adjuvanted (FLUAD) 0.5 ML injection, Inject into the muscle., Disp: 0.5 mL, Rfl: 0   insulin degludec (TRESIBA FLEXTOUCH) 100 UNIT/ML SOPN FlexTouch Pen, Inject 20 Units into the skin daily after breakfast. , Disp: , Rfl:    ipratropium-albuterol (DUONEB) 0.5-2.5 (3) MG/3ML SOLN, 1 vial three times daily and every 6 hours as needed, Disp: 360 mL, Rfl: 2   irbesartan (AVAPRO) 75 MG tablet, Take 1 tablet by mouth daily., Disp: , Rfl:    losartan (COZAAR) 25 MG tablet, Take 1 tablet (25 mg total) by mouth daily., Disp:  90 tablet, Rfl: 2   Magnesium Oxide 400 MG CAPS, Take 2 capsules (800 mg total) by mouth in the morning and at bedtime., Disp: 90 capsule, Rfl: 1   omeprazole (PRILOSEC) 20 MG capsule, Take 20 mg by mouth 2 (two) times daily before a meal. , Disp: , Rfl:    oxybutynin (DITROPAN-XL) 10 MG 24 hr tablet, Take 10 mg by mouth daily., Disp: , Rfl:    rosuvastatin (CRESTOR) 5 MG tablet, Take 5 mg by mouth daily., Disp: , Rfl:    spironolactone (ALDACTONE) 25 MG tablet, TAKE ONE-HALF TABLET BY  MOUTH DAILY, Disp: 45 tablet, Rfl: 3   tamsulosin (FLOMAX) 0.4 MG CAPS capsule, Take 0.4 mg by mouth 2 (two) times daily. , Disp: , Rfl:    traMADol (ULTRAM) 50 MG tablet, Take 50 mg by mouth as needed., Disp: , Rfl:    albuterol (VENTOLIN HFA) 108 (90 Base) MCG/ACT inhaler, Inhale 2 puffs into  the lungs every 6 (six) hours as needed for wheezing or shortness of breath. (Patient not taking: Reported on 12/27/2020), Disp: 8 g, Rfl: 6   furosemide (LASIX) 40 MG tablet, Take 1 tablet (40 mg total) by mouth daily., Disp: 90 tablet, Rfl: 3 No current facility-administered medications for this visit.  Facility-Administered Medications Ordered in Other Visits:    0.9 %  sodium chloride infusion, , Intravenous, Continuous, Shateria Paternostro, Rudell Cobb, MD, Stopped at 05/16/13 1115  Allergies:  Allergies  Allergen Reactions   Bupropion Hives and Other (See Comments)   Cephalexin Diarrhea and Other (See Comments)    Caused C-diff, also    Fluoxetine Itching   Ibuprofen Itching   Prednisone Itching and Other (See Comments)    Abdominal pain, also    Temazepam Other (See Comments)    Dizziness    Trazodone And Nefazodone Other (See Comments)    Dizziness   Fluoxetine Hcl Itching   Prozac [Fluoxetine Hcl] Itching    Past Medical History, Surgical history, Social history, and Family History were reviewed and updated.  Review of Systems: Review of Systems  Constitutional: Negative.   HENT: Negative.    Eyes: Negative.   Respiratory:  Positive for shortness of breath.   Cardiovascular:  Positive for chest pain.  Gastrointestinal: Negative.   Genitourinary: Negative.   Musculoskeletal:  Positive for back pain.  Skin: Negative.   Neurological: Negative.   Endo/Heme/Allergies: Negative.   Psychiatric/Behavioral: Negative.     Physical Exam:  weight is 257 lb (116.6 kg). His oral temperature is 97.6 F (36.4 C). His blood pressure is 113/74 and his pulse is 76. His respiration is 18 and oxygen saturation is 97%.   Physical Exam Vitals reviewed.  HENT:     Head: Normocephalic and atraumatic.  Eyes:     Pupils: Pupils are equal, round, and reactive to light.  Cardiovascular:     Rate and Rhythm: Normal rate and regular rhythm.     Heart sounds: Normal heart sounds.  Pulmonary:      Effort: Pulmonary effort is normal.     Breath sounds: Normal breath sounds.  Abdominal:     General: Bowel sounds are normal.     Palpations: Abdomen is soft.  Musculoskeletal:        General: No tenderness or deformity. Normal range of motion.     Cervical back: Normal range of motion.  Lymphadenopathy:     Cervical: No cervical adenopathy.  Skin:    General: Skin is warm and dry.  Findings: No erythema or rash.  Neurological:     Mental Status: He is alert and oriented to person, place, and time.  Psychiatric:        Behavior: Behavior normal.        Thought Content: Thought content normal.        Judgment: Judgment normal.     Lab Results  Component Value Date   WBC 9.5 04/07/2021   HGB 12.2 (L) 04/07/2021   HCT 37.3 (L) 04/07/2021   MCV 105.7 (H) 04/07/2021   PLT 622 (H) 04/07/2021     Chemistry      Component Value Date/Time   NA 139 04/07/2021 0817   NA 140 05/10/2020 0905   NA 141 04/10/2017 0923   NA 140 04/05/2016 0739   K 4.0 04/07/2021 0817   K 3.5 04/10/2017 0923   K 4.2 04/05/2016 0739   CL 106 04/07/2021 0817   CL 108 04/10/2017 0923   CO2 26 04/07/2021 0817   CO2 24 04/10/2017 0923   CO2 20 (L) 04/05/2016 0739   BUN 23 04/07/2021 0817   BUN 14 05/10/2020 0905   BUN 12 04/10/2017 0923   BUN 14.7 04/05/2016 0739   CREATININE 1.18 04/07/2021 0817   CREATININE 1.1 04/10/2017 0923   CREATININE 1.1 04/05/2016 0739      Component Value Date/Time   CALCIUM 9.1 04/07/2021 0817   CALCIUM 8.4 04/10/2017 0923   CALCIUM 9.0 04/05/2016 0739   ALKPHOS 56 04/07/2021 0817   ALKPHOS 77 04/10/2017 0923   ALKPHOS 91 04/05/2016 0739   AST 11 (L) 04/07/2021 0817   AST 34 04/05/2016 0739   ALT 11 04/07/2021 0817   ALT 32 04/10/2017 0923   ALT 40 04/05/2016 0739   BILITOT 0.5 04/07/2021 0817   BILITOT 0.48 04/05/2016 0739      Impression and Plan: Mr. Carton is 70 year old gentleman with polycythemia vera.  He is on Hydrea.  Going to have to  increase dose of Hydrea.  I do still like the fact that his platelet count is going up.  We really have to get this back down a little bit.  We will plan to get him back in 2 months.  We will try to get him through Christmas and most of The winter.  Am happy that he is lost weight.  I am happy that he is working which he really enjoys.     Volanda Napoleon, MD 12/1/20229:30 AM

## 2021-04-07 NOTE — Telephone Encounter (Signed)
Per 04/07/21 los - gave upcoming appointment - confirmed

## 2021-04-08 DIAGNOSIS — D45 Polycythemia vera: Secondary | ICD-10-CM | POA: Diagnosis not present

## 2021-04-08 DIAGNOSIS — N1831 Chronic kidney disease, stage 3a: Secondary | ICD-10-CM | POA: Diagnosis not present

## 2021-04-08 DIAGNOSIS — Z794 Long term (current) use of insulin: Secondary | ICD-10-CM | POA: Diagnosis not present

## 2021-04-08 DIAGNOSIS — J449 Chronic obstructive pulmonary disease, unspecified: Secondary | ICD-10-CM | POA: Diagnosis not present

## 2021-04-08 DIAGNOSIS — E7800 Pure hypercholesterolemia, unspecified: Secondary | ICD-10-CM | POA: Diagnosis not present

## 2021-04-08 DIAGNOSIS — E1122 Type 2 diabetes mellitus with diabetic chronic kidney disease: Secondary | ICD-10-CM | POA: Diagnosis not present

## 2021-04-08 DIAGNOSIS — K5190 Ulcerative colitis, unspecified, without complications: Secondary | ICD-10-CM | POA: Diagnosis not present

## 2021-04-08 DIAGNOSIS — I130 Hypertensive heart and chronic kidney disease with heart failure and stage 1 through stage 4 chronic kidney disease, or unspecified chronic kidney disease: Secondary | ICD-10-CM | POA: Diagnosis not present

## 2021-04-14 ENCOUNTER — Ambulatory Visit: Payer: Medicare Other | Admitting: Physician Assistant

## 2021-04-15 DIAGNOSIS — E1122 Type 2 diabetes mellitus with diabetic chronic kidney disease: Secondary | ICD-10-CM | POA: Diagnosis not present

## 2021-04-15 DIAGNOSIS — M109 Gout, unspecified: Secondary | ICD-10-CM | POA: Diagnosis not present

## 2021-04-15 DIAGNOSIS — E7800 Pure hypercholesterolemia, unspecified: Secondary | ICD-10-CM | POA: Diagnosis not present

## 2021-04-27 DIAGNOSIS — M069 Rheumatoid arthritis, unspecified: Secondary | ICD-10-CM | POA: Diagnosis not present

## 2021-04-27 DIAGNOSIS — K219 Gastro-esophageal reflux disease without esophagitis: Secondary | ICD-10-CM | POA: Diagnosis not present

## 2021-04-27 DIAGNOSIS — E1122 Type 2 diabetes mellitus with diabetic chronic kidney disease: Secondary | ICD-10-CM | POA: Diagnosis not present

## 2021-04-27 DIAGNOSIS — I10 Essential (primary) hypertension: Secondary | ICD-10-CM | POA: Diagnosis not present

## 2021-04-27 DIAGNOSIS — J449 Chronic obstructive pulmonary disease, unspecified: Secondary | ICD-10-CM | POA: Diagnosis not present

## 2021-04-27 DIAGNOSIS — D630 Anemia in neoplastic disease: Secondary | ICD-10-CM | POA: Diagnosis not present

## 2021-04-27 DIAGNOSIS — I5042 Chronic combined systolic (congestive) and diastolic (congestive) heart failure: Secondary | ICD-10-CM | POA: Diagnosis not present

## 2021-04-27 DIAGNOSIS — D631 Anemia in chronic kidney disease: Secondary | ICD-10-CM | POA: Diagnosis not present

## 2021-04-27 DIAGNOSIS — N1831 Chronic kidney disease, stage 3a: Secondary | ICD-10-CM | POA: Diagnosis not present

## 2021-04-27 DIAGNOSIS — E7800 Pure hypercholesterolemia, unspecified: Secondary | ICD-10-CM | POA: Diagnosis not present

## 2021-05-13 ENCOUNTER — Ambulatory Visit (INDEPENDENT_AMBULATORY_CARE_PROVIDER_SITE_OTHER): Payer: Medicare Other

## 2021-05-13 DIAGNOSIS — Z950 Presence of cardiac pacemaker: Secondary | ICD-10-CM | POA: Diagnosis not present

## 2021-05-13 DIAGNOSIS — I5042 Chronic combined systolic (congestive) and diastolic (congestive) heart failure: Secondary | ICD-10-CM

## 2021-05-13 NOTE — Progress Notes (Signed)
EPIC Encounter for ICM Monitoring  Patient Name: William Villanueva is a 71 y.o. male Date: 05/13/2021 Primary Care Physican: Antony Contras, MD Primary Cardiologist: Stanford Breed Electrophysiologist: Vergie Living Pacing: 95%            05/13/2021 Weight: 251 lbs   Battery Longevity: 7.4 months                                                            Spoke with patient and heart failure questions reviewed.  Pt asymptomatic for fluid accumulation and feeling well.  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                 CorVue thoracic impedance normal but was suggesting possible fluid accumulation 11/9-11/16.   Prescribed:   Furosemide 20 mg take 1 tablet daily.   (03/09/21 phone note: Dr Stanford Breed approved of taking 0.5 tablet twice a day as requested by pt.) Spironolactone 25 mg take 0.5 tablet (12.5 mg total) once a day   Labs: 04/07/2021 Creatinine 1.18, BUN 23, Potassium 4.0, Sodium 139, GFR >60 12/27/2020 Creatinine 1.27, BUN 13, Potassium 4.0, Sodium 139, GFR >60 11/15/2020 Creatinine 1.30, BUN 23, Potassium 4.4, Sodium 137, GFR 59 09/13/2020 Creatinine 1.24, BUN 17, Potassium 4.1, Sodium 138, GFR >60 07/13/2020 Creatinine 1.44, BUN 24, Potassium 4.5, Sodium 135, GFR 53 05/14/2020 Creatinine 1.02, BUN 18, Potassium 4.2, Sodium 141, GFR >60  A complete set of results can be found in Results Review.   Recommendations:  No changes and encouraged to call if experiencing any fluid symptoms.   Follow-up plan: ICM clinic phone appointment on 06/13/2021.   91 day device clinic remote transmission 06/16/2021.     EP/Cardiology Office Visits:  06/02/2021 with Fabian Sharp, [place].  Recall 08/03/2021 with Dr Caryl Comes.   Copy of ICM check sent to Dr. Caryl Comes.   3 month ICM trend: 05/13/2021.    12-14 Month ICM trend:     Rosalene Billings, RN 05/13/2021 5:05 PM

## 2021-05-18 DIAGNOSIS — K219 Gastro-esophageal reflux disease without esophagitis: Secondary | ICD-10-CM | POA: Diagnosis not present

## 2021-05-18 DIAGNOSIS — I130 Hypertensive heart and chronic kidney disease with heart failure and stage 1 through stage 4 chronic kidney disease, or unspecified chronic kidney disease: Secondary | ICD-10-CM | POA: Diagnosis not present

## 2021-05-18 DIAGNOSIS — I5022 Chronic systolic (congestive) heart failure: Secondary | ICD-10-CM | POA: Diagnosis not present

## 2021-05-18 DIAGNOSIS — E7800 Pure hypercholesterolemia, unspecified: Secondary | ICD-10-CM | POA: Diagnosis not present

## 2021-05-18 DIAGNOSIS — I10 Essential (primary) hypertension: Secondary | ICD-10-CM | POA: Diagnosis not present

## 2021-05-18 DIAGNOSIS — J449 Chronic obstructive pulmonary disease, unspecified: Secondary | ICD-10-CM | POA: Diagnosis not present

## 2021-05-18 DIAGNOSIS — I5043 Acute on chronic combined systolic (congestive) and diastolic (congestive) heart failure: Secondary | ICD-10-CM | POA: Diagnosis not present

## 2021-05-18 DIAGNOSIS — E1122 Type 2 diabetes mellitus with diabetic chronic kidney disease: Secondary | ICD-10-CM | POA: Diagnosis not present

## 2021-05-18 DIAGNOSIS — D631 Anemia in chronic kidney disease: Secondary | ICD-10-CM | POA: Diagnosis not present

## 2021-05-18 DIAGNOSIS — N1831 Chronic kidney disease, stage 3a: Secondary | ICD-10-CM | POA: Diagnosis not present

## 2021-05-25 NOTE — Progress Notes (Signed)
Cardiology Office Note:    Date:  06/02/2021   ID:  William Villanueva, DOB August 15, 1950, MRN [id number]  PCP:  Antony Contras, MD   [facility] Providers Cardiologist:  Kirk Ruths, MD { Referring MD: Antony Contras, MD   Chief Complaint  Patient presents with   Follow-up    hypervolemia    History of Present Illness:    William Villanueva is a 71 y.o. male with a hx of cardiomyopathy, echocardiogram August 2019 with an LVEF of 35 to 40%.  Cardiac catheterization September 2013 with mild nonobstructive coronary disease and no evidence of restriction.  Cardiac MR September 2013 diffuse hypokinesis, EF 34%, no scar or evidence of cardiac hemochromatosis.  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  Nuclear stress test in 2017 showed prior inferior infarct but no ischemia, felt likely attenuation.  Echocardiogram February 2022 showed improved LVEF of 45 to 50%, moderately dilated LV, and grade 1 diastolic dysfunction. DOE has been attributed to COPD. He was last seen in clinic with Dr. Stanford Breed 09/2020 and was doing well.   He is followed by Dr. Marin Olp for polycythemia vera-JAK2 positive with scheduled phlebotomies.   He presents today for routine follow up. 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 GDMT includes coreg and ARB.   Irbesartan and losartan both listed in medications - he is only taking losartan, confirmed with his wife.  On recent device check, impedence was normal but suggesting possible fluid accumulation 11/9-11/16/22. He was added to my schedule. He is maintained on spiro 12.5 mg and 20 mg lasix BID.   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 He is taking 20 mg lasix BID (1/2 of 40 mg). He is taking 25 mg losartan, not irbesartan. He describes pills getting stuck in his throat tues evening and he has been coughing. I advised seeing PCP if cough continues. He also reports dyspnea on  exertion. He is off work for 2 days and will take a whole lasix once daily. We discussed that this isn't an increase in his dosage. On his off days from work he will take 40 mg qAM and 20 qPM. He is not using his nighttime nebulizer due to cost - he only takes it once daily. He does report some dyspnea on exertion, but this is likely multifactorial.    Past Medical History:  Diagnosis Date   AV block, 2nd degree 2015   St. Jude Allure Quadra pulse generator [id number], model PM 3242   Back pain    Cardiomyopathy (HCC)    Nonischemic 45%.    CHF (congestive heart failure) ([place])    Gout    Hemochromatosis    Hypertension    Hypospadias Nov 17, 1950   born with   Nephrolithiasis    Polycythemia vera(238.4)     Past Surgical History:  Procedure Laterality Date   ANKLE SURGERY     BI-VENTRICULAR PACEMAKER INSERTION N/A 04/29/2014   Procedure: BI-VENTRICULAR PACEMAKER INSERTION (CRT-P);  Surgeon: Deboraha Sprang, MD; Laterality: Left  St. Jude Allure Quadra pulse generator [id number], model [place] [id number]   COLONOSCOPY N/A 02/15/2020   Procedure: COLONOSCOPY;  Surgeon: Ronnette Juniper, MD;  Location: [place];  Service: Gastroenterology;  Laterality: N/A;   ESOPHAGOGASTRODUODENOSCOPY (EGD) WITH PROPOFOL N/A 02/15/2020   Procedure: ESOPHAGOGASTRODUODENOSCOPY (EGD) WITH PROPOFOL;  Surgeon: Ronnette Juniper, MD;  Location: [place];  Service: Gastroenterology;  Laterality: N/A;  PILONIDAL CYST EXCISION     POSTERIOR LAMINECTOMY / DECOMPRESSION LUMBAR SPINE     TONSILLECTOMY      Current Medications: Current Meds  Medication Sig   albuterol (VENTOLIN HFA) 108 (90 Base) MCG/ACT inhaler Inhale 2 puffs into the lungs every 6 (six) hours as needed for wheezing or shortness of breath.   allopurinol (ZYLOPRIM) 300 MG tablet Take 300 mg by mouth daily.   BD PEN NEEDLE NANO U/F 32G X 4 MM MISC    budesonide (PULMICORT) 0.5 MG/2ML nebulizer solution Take 2 mLs (0.5 mg total) by nebulization 2 (two) times daily.    carvedilol (COREG) 12.5 MG tablet TAKE 1 TABLET BY MOUTH  TWICE DAILY WITH MEALS   citalopram (CELEXA) 10 MG tablet Take 5 mg by mouth daily.    colestipol (COLESTID) 1 g tablet Take 1 tablet by mouth in the morning, at noon, and at bedtime.   COVID-19 mRNA bivalent vaccine, Pfizer, (PFIZER COVID-19 VAC BIVALENT) injection Inject into the muscle.   dicyclomine (BENTYL) 20 MG tablet Take 20 mg by mouth 4 (four) times daily.   folic acid (FOLVITE) 1 MG tablet Take 1 mg by mouth daily.   gabapentin (NEURONTIN) 300 MG capsule Take 300 mg by mouth daily.   hydroxyurea (HYDREA) 500 MG capsule Take 2 capsules (1,000 mg total) by mouth daily. May take with food to minimize GI side effects. Currently taking 500 mg daily but MD note says may need to increase on 12/27/2020 to twice daily.   influenza vaccine adjuvanted (FLUAD) 0.5 ML injection Inject into the muscle.   insulin degludec (TRESIBA FLEXTOUCH) 100 UNIT/ML SOPN FlexTouch Pen Inject 20 Units into the skin daily after breakfast.    ipratropium-albuterol (DUONEB) 0.5-2.5 (3) MG/3ML SOLN 1 vial three times daily and every 6 hours as needed   losartan (COZAAR) 25 MG tablet Take 1 tablet (25 mg total) by mouth daily.   Magnesium Oxide 400 MG CAPS Take 2 capsules (800 mg total) by mouth in the morning and at bedtime.   omeprazole (PRILOSEC) 20 MG capsule Take 20 mg by mouth 2 (two) times daily before a meal.    oxybutynin (DITROPAN-XL) 10 MG 24 hr tablet Take 10 mg by mouth daily.   rosuvastatin (CRESTOR) 5 MG tablet Take 5 mg by mouth daily.   spironolactone (ALDACTONE) 25 MG tablet TAKE ONE-HALF TABLET BY  MOUTH DAILY   tamsulosin (FLOMAX) 0.4 MG CAPS capsule Take 0.4 mg by mouth 2 (two) times daily.    traMADol (ULTRAM) 50 MG tablet Take 50 mg by mouth as needed.   [DISCONTINUED] furosemide (LASIX) 20 MG tablet TAKE 1 TABLET BY MOUTH  DAILY   [DISCONTINUED] irbesartan (AVAPRO) 75 MG tablet Take 1 tablet by mouth daily.     Allergies:    Bupropion, Cephalexin, Fluoxetine, Ibuprofen, Prednisone, Temazepam, Trazodone and nefazodone, Fluoxetine hcl, and Prozac [fluoxetine hcl]   Social History   Socioeconomic History   Marital status: Married    Spouse name: Not on file   Number of children: Not on file   Years of education: Not on file   Highest education level: Not on file  Occupational History   Not on file  Tobacco Use   Smoking status: Former    Packs/day: 1.00    Years: 44.00    Pack years: 44.00    Types: Cigarettes    Start date: 06/05/1968    Quit date: 04/05/2013    Years since quitting: 8.1   Smokeless tobacco: Never  Tobacco comments:    quit in 2014  Vaping Use   Vaping Use: Never used  Substance and Sexual Activity   Alcohol use: Yes    Alcohol/week: 0.0 standard drinks    Comment: rare   Drug use: No   Sexual activity: Not Currently  Other Topics Concern   Not on file  Social History Narrative   Lives at home with wife in a one story home.  Has no children.  Does not work.  Getting workman's comp.  Education: 4 years trade school.    Social Determinants of Health   Financial Resource Strain: Not on file  Food Insecurity: Not on file  Transportation Needs: Not on file  Physical Activity: Not on file  Stress: Not on file  Social Connections: Not on file     Family History: The patient's family history includes Diabetes in his sister; Heart disease in his maternal grandmother; Hypertension in his sister; Other in his father; Stroke in his mother.  ROS:   Please see the history of present illness.     All other systems reviewed and are negative.  EKGs/Labs/Other Studies Reviewed:    The following studies were reviewed today:  Echo 06/21/20: 1. Limited AV optimization study.   2. Left ventricular ejection fraction, by estimation, is 45 to 50%. The  left ventricle has mildly decreased function. The left ventricle  demonstrates global hypokinesis. The left ventricular internal cavity  size  was moderately dilated. Left ventricular  diastolic parameters are consistent with Grade I diastolic dysfunction  (impaired relaxation).   3. Right ventricular systolic function is normal. The right ventricular  size is normal.   4. The mitral valve is normal in structure. No evidence of mitral valve  regurgitation. No evidence of mitral stenosis.   5. The aortic valve was not assessed.   EKG:  EKG is  ordered today.  The ekg ordered today demonstrates A-sensed, V-paced  Recent Labs: 11/15/2020: Magnesium 1.8 04/07/2021: ALT 11; BUN 23; Creatinine 1.18; Hemoglobin 12.2; Platelet Count 622; Potassium 4.0; Sodium 139  Recent Lipid Panel    Component Value Date/Time   CHOL 126 04/29/2014 0139   TRIG 146 04/29/2014 0139   HDL 29 (L) 04/29/2014 0139   CHOLHDL 4.3 04/29/2014 0139   VLDL 29 04/29/2014 0139   LDLCALC 68 04/29/2014 0139     Risk Assessment/Calculations:           Physical Exam:    VS:  BP (!) 106/58    Pulse 93    Ht 5\' 10"  (1.778 m)    Wt 259 lb 9.6 oz (117.8 kg)    SpO2 93%    BMI 37.25 kg/m     Wt Readings from Last 3 Encounters:  06/02/21 259 lb 9.6 oz (117.8 kg)  04/07/21 257 lb (116.6 kg)  01/18/21 266 lb 12.8 oz (121 kg)     GEN: obese male in NAD HEENT: Normal NECK: No JVD; No carotid bruits LYMPHATICS: No lymphadenopathy CARDIAC: RRR, no murmurs, rubs, gallops RESPIRATORY:  wheezing throughout  ABDOMEN: Soft, non-tender, non-distended MUSCULOSKELETAL:  No edema; No deformity  SKIN: Warm and dry NEUROLOGIC:  Alert and oriented x 3 PSYCHIATRIC:  Normal affect   ASSESSMENT:    1. Chronic combined systolic and diastolic congestive heart failure ([place])   2. NICM (nonischemic cardiomyopathy) ([place])   3. COPD with acute exacerbation ([place])   4. Primary hypertension   5. Pacemaker-CRT    PLAN:    In order of problems  listed above:  NICM Chronic systolic and diastolic heart failure Hypertension GDMT: 12.5 mg spironolactone, coreg, ARB,  lasix - on work days, he takes 20 mg lasix BID, on off days he will take 40 / 20 mg - continue spiro - hold off on SGLT2i at this point - he denies orthopnea, but does report DOE, but I think this is multifactorial given his wheezing on exam - will increase lasix on his off days to 40 / 20 mg - keep him at 40 mg tablets of lasix to reduce confusion   COPD - is only using nebulizer in the morning due to cost - I encouraged him to talk to his PCP about alternative given his wheezing on exam - I suspect his dyspnea is likely multifactorial   CRT-P for high grade AV block CoreVue showed possible increased fluid in early Nov - he appears euvolemic on exam   Follow up in 6 months.      Medication Adjustments/Labs and Tests Ordered: Current medicines are reviewed at length with the patient today.  Concerns regarding medicines are outlined above.  Orders Placed This Encounter  Procedures   EKG 12-Lead   Meds ordered this encounter  Medications   furosemide (LASIX) 40 MG tablet    Sig: Monday-Wednesday TAKE 1/2 TAB IN THE MORNING AND 1/2 TAB AT LUNCH, Thursday & Friday TAKE 1 TAB IN THE MORNING AND 1/2 TAB AT LUNCH.    Dispense:  90 tablet    Refill:  3    Patient Instructions  Medication Instructions:  Stop Lasix 20 mg Stop Irbesartan Take Lasix 1/2 tab in the morning and at lunch Saturday through Wednesday, 1 tab in the morning and 1/2 tab at lunch on Thursday and Friday twice daily  *If you need a refill on your cardiac medications before your next appointment, please call your pharmacy*   Lab Work: NONE ordered at this time of appointment   If you have labs (blood work) drawn today and your tests are completely normal, you will receive your results only by: [place] (if you have MyChart) OR A paper copy in the mail If you have any lab test that is abnormal or we need to change your treatment, we will call you to review the results.   Testing/Procedures: NONE  ordered at this time of appointment     Follow-Up: At [facility], you and your health needs are our priority.  As part of our continuing mission to provide you with exceptional heart care, we have created designated Provider Care Teams.  These Care Teams include your primary Cardiologist (physician) and Advanced Practice Providers (APPs -  Physician Assistants and Nurse Practitioners) who all work together to provide you with the care you need, when you need it.  We recommend signing up for the patient portal called "MyChart".  Sign up information is provided on this After Visit Summary.  MyChart is used to connect with patients for Virtual Visits (Telemedicine).  Patients are able to view lab/test results, encounter notes, upcoming appointments, etc.  Non-urgent messages can be sent to your provider as well.   To learn more about what you can do with MyChart, go to [email or web address].    Your next appointment:   6 month(s)  The format for your next appointment:   In Person  Provider:   Kirk Ruths, MD     Other Instructions None     Signed, Ledora Bottcher, [place]  06/02/2021 10:17 AM    [facility]

## 2021-06-02 ENCOUNTER — Ambulatory Visit: Payer: Medicare Other | Admitting: Physician Assistant

## 2021-06-02 ENCOUNTER — Other Ambulatory Visit: Payer: Self-pay

## 2021-06-02 ENCOUNTER — Telehealth: Payer: Self-pay | Admitting: Physician Assistant

## 2021-06-02 ENCOUNTER — Encounter: Payer: Self-pay | Admitting: Physician Assistant

## 2021-06-02 VITALS — BP 106/58 | HR 93 | Ht 70.0 in | Wt 259.6 lb

## 2021-06-02 DIAGNOSIS — I5042 Chronic combined systolic (congestive) and diastolic (congestive) heart failure: Secondary | ICD-10-CM | POA: Diagnosis not present

## 2021-06-02 DIAGNOSIS — I10 Essential (primary) hypertension: Secondary | ICD-10-CM

## 2021-06-02 DIAGNOSIS — Z950 Presence of cardiac pacemaker: Secondary | ICD-10-CM

## 2021-06-02 DIAGNOSIS — I428 Other cardiomyopathies: Secondary | ICD-10-CM

## 2021-06-02 DIAGNOSIS — J441 Chronic obstructive pulmonary disease with (acute) exacerbation: Secondary | ICD-10-CM | POA: Diagnosis not present

## 2021-06-02 MED ORDER — FUROSEMIDE 40 MG PO TABS: ORAL_TABLET | ORAL | 3 refills | Status: DC

## 2021-06-02 NOTE — Telephone Encounter (Signed)
°  Take Lasix 1/2 tab in the morning and at lunch Saturday through Wednesday, 1 tab in the morning and 1/2 tab at lunch on Thursday and Friday twice daily Sig confirmed with [facility] sent to [facility] and called to confirm receipt

## 2021-06-02 NOTE — Patient Instructions (Signed)
Medication Instructions:  Stop Lasix 20 mg Stop Irbesartan Take Lasix 1/2 tab in the morning and at lunch Saturday through Wednesday, 1 tab in the morning and 1/2 tab at lunch on Thursday and Friday twice daily  *If you need a refill on your cardiac medications before your next appointment, please call your pharmacy*   Lab Work: NONE ordered at this time of appointment   If you have labs (blood work) drawn today and your tests are completely normal, you will receive your results only by: [place] (if you have MyChart) OR A paper copy in the mail If you have any lab test that is abnormal or we need to change your treatment, we will call you to review the results.   Testing/Procedures: NONE ordered at this time of appointment     Follow-Up: At [facility], you and your health needs are our priority.  As part of our continuing mission to provide you with exceptional heart care, we have created designated Provider Care Teams.  These Care Teams include your primary Cardiologist (physician) and Advanced Practice Providers (APPs -  Physician Assistants and Nurse Practitioners) who all work together to provide you with the care you need, when you need it.  We recommend signing up for the patient portal called "MyChart".  Sign up information is provided on this After Visit Summary.  MyChart is used to connect with patients for Virtual Visits (Telemedicine).  Patients are able to view lab/test results, encounter notes, upcoming appointments, etc.  Non-urgent messages can be sent to your provider as well.   To learn more about what you can do with MyChart, go to [email or web address].    Your next appointment:   6 month(s)  The format for your next appointment:   In Person  Provider:   Kirk Ruths, MD     Other Instructions None

## 2021-06-02 NOTE — Telephone Encounter (Signed)
Pharmacy called to clarify the instructions on the patient's furosemide (LASIX) 40 MG tablet.  They want to know if patient is to take anything on the weekend.

## 2021-06-09 ENCOUNTER — Other Ambulatory Visit: Payer: Self-pay | Admitting: Pulmonary Disease

## 2021-06-09 DIAGNOSIS — N189 Chronic kidney disease, unspecified: Secondary | ICD-10-CM | POA: Diagnosis not present

## 2021-06-09 DIAGNOSIS — R809 Proteinuria, unspecified: Secondary | ICD-10-CM | POA: Diagnosis not present

## 2021-06-09 DIAGNOSIS — N281 Cyst of kidney, acquired: Secondary | ICD-10-CM | POA: Diagnosis not present

## 2021-06-09 DIAGNOSIS — I10 Essential (primary) hypertension: Secondary | ICD-10-CM | POA: Diagnosis not present

## 2021-06-09 DIAGNOSIS — I428 Other cardiomyopathies: Secondary | ICD-10-CM | POA: Diagnosis not present

## 2021-06-10 ENCOUNTER — Encounter: Payer: Self-pay | Admitting: Hematology & Oncology

## 2021-06-10 ENCOUNTER — Inpatient Hospital Stay: Payer: Medicare Other | Admitting: Hematology & Oncology

## 2021-06-10 ENCOUNTER — Other Ambulatory Visit: Payer: Self-pay

## 2021-06-10 ENCOUNTER — Inpatient Hospital Stay: Payer: Medicare Other | Attending: Hematology & Oncology

## 2021-06-10 VITALS — BP 114/72 | HR 81 | Temp 97.9°F | Resp 20 | Wt 256.0 lb

## 2021-06-10 DIAGNOSIS — I441 Atrioventricular block, second degree: Secondary | ICD-10-CM | POA: Insufficient documentation

## 2021-06-10 DIAGNOSIS — Z7982 Long term (current) use of aspirin: Secondary | ICD-10-CM | POA: Diagnosis not present

## 2021-06-10 DIAGNOSIS — D45 Polycythemia vera: Secondary | ICD-10-CM | POA: Diagnosis not present

## 2021-06-10 DIAGNOSIS — N189 Chronic kidney disease, unspecified: Secondary | ICD-10-CM | POA: Diagnosis not present

## 2021-06-10 LAB — CBC WITH DIFFERENTIAL (CANCER CENTER ONLY)
Abs Immature Granulocytes: 0.04 10*3/uL (ref 0.00–0.07)
Basophils Absolute: 0.1 10*3/uL (ref 0.0–0.1)
Basophils Relative: 2 %
Eosinophils Absolute: 0 10*3/uL (ref 0.0–0.5)
Eosinophils Relative: 1 %
HCT: 37.5 % — ABNORMAL LOW (ref 39.0–52.0)
Hemoglobin: 12.4 g/dL — ABNORMAL LOW (ref 13.0–17.0)
Immature Granulocytes: 1 %
Lymphocytes Relative: 16 %
Lymphs Abs: 0.7 10*3/uL (ref 0.7–4.0)
MCH: 38.5 pg — ABNORMAL HIGH (ref 26.0–34.0)
MCHC: 33.1 g/dL (ref 30.0–36.0)
MCV: 116.5 fL — ABNORMAL HIGH (ref 80.0–100.0)
Monocytes Absolute: 0.3 10*3/uL (ref 0.1–1.0)
Monocytes Relative: 7 %
Neutro Abs: 3.1 10*3/uL (ref 1.7–7.7)
Neutrophils Relative %: 73 %
Platelet Count: 263 10*3/uL (ref 150–400)
RBC: 3.22 MIL/uL — ABNORMAL LOW (ref 4.22–5.81)
RDW: 16.9 % — ABNORMAL HIGH (ref 11.5–15.5)
WBC Count: 4.3 10*3/uL (ref 4.0–10.5)
nRBC: 0 % (ref 0.0–0.2)

## 2021-06-10 LAB — IRON AND IRON BINDING CAPACITY (CC-WL,HP ONLY)
Iron: 101 ug/dL (ref 45–182)
Saturation Ratios: 26 % (ref 17.9–39.5)
TIBC: 396 ug/dL (ref 250–450)
UIBC: 295 ug/dL (ref 117–376)

## 2021-06-10 LAB — CMP (CANCER CENTER ONLY)
ALT: 9 U/L (ref 0–44)
AST: 14 U/L — ABNORMAL LOW (ref 15–41)
Albumin: 4.1 g/dL (ref 3.5–5.0)
Alkaline Phosphatase: 58 U/L (ref 38–126)
Anion gap: 8 (ref 5–15)
BUN: 20 mg/dL (ref 8–23)
CO2: 26 mmol/L (ref 22–32)
Calcium: 9.2 mg/dL (ref 8.9–10.3)
Chloride: 105 mmol/L (ref 98–111)
Creatinine: 1.22 mg/dL (ref 0.61–1.24)
GFR, Estimated: >60 mL/min (ref >60)
Glucose, Bld: 104 mg/dL — ABNORMAL HIGH (ref 70–99)
Potassium: 4.5 mmol/L (ref 3.5–5.1)
Sodium: 139 mmol/L (ref 135–145)
Total Bilirubin: 0.6 mg/dL (ref 0.3–1.2)
Total Protein: 6.6 g/dL (ref 6.5–8.1)

## 2021-06-10 LAB — LACTATE DEHYDROGENASE: LDH: 199 U/L — ABNORMAL HIGH (ref 98–192)

## 2021-06-10 LAB — FERRITIN: Ferritin: 29 ng/mL (ref 24–336)

## 2021-06-10 LAB — SAVE SMEAR(SSMR), FOR PROVIDER SLIDE REVIEW

## 2021-06-10 NOTE — Progress Notes (Signed)
Hematology and Oncology Follow Up Visit  Quatavious Rossa [id number] 11/29/1950 71 y.o. 06/10/2021   Principle Diagnosis:  Polycythemia vera-JAK2 positive Heart block-[place] II  Current Therapy:   Hydrea 500 mg p.o.BID -dose changed on 06/10/2021 Aspirin 81 mg by mouth daily Phlebotomy to maintain hematocrit below 45%     Interim History:  Mr.  Franzen is back for followup.  He looks fantastic.  He is losing weight.  He is happy about the weight loss.  He is still working.  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  He really does well with this.  His students really are learning and he really is making a difference.  He had a good Thanksgiving, Christmas and New Year's.  His wife is doing well.  She had some problems around Thanksgiving but these seem to have resolved.  He has had no heart difficulties.  He does have the pacemaker in.  He has had no change in bowel or bladder habits.  He has had no nausea or vomiting.  He has had no cough or shortness of breath.  He has had a little bit of problems with the right hand.  I will know if there might be a little carpal tunnel.  His platelet count has come down incredibly well.  As such, we will decrease his Hydrea dose back to 500 mg p.o. twice daily.  Overall, his performance status is ECOG 1.    Medications:  Current Outpatient Medications:    allopurinol (ZYLOPRIM) 300 MG tablet, Take 300 mg by mouth daily., Disp: , Rfl:    BD PEN NEEDLE NANO U/F 32G X 4 MM MISC, , Disp: , Rfl:    budesonide (PULMICORT) 0.5 MG/2ML nebulizer solution, Take 2 mLs (0.5 mg total) by nebulization 2 (two) times daily., Disp: 120 mL, Rfl: 2   carvedilol (COREG) 12.5 MG tablet, TAKE 1 TABLET BY MOUTH  TWICE DAILY WITH MEALS, Disp: 180 tablet, Rfl: 3   citalopram (CELEXA) 10 MG tablet, Take 5 mg by mouth daily. , Disp: , Rfl:    colestipol (COLESTID) 1 g tablet, Take 1 tablet by mouth in the morning, at noon, and at bedtime., Disp: , Rfl:     COVID-19 mRNA bivalent vaccine, Pfizer, (PFIZER COVID-19 VAC BIVALENT) injection, Inject into the muscle., Disp: 0.3 mL, Rfl: 0   dicyclomine (BENTYL) 20 MG tablet, Take 20 mg by mouth 4 (four) times daily., Disp: , Rfl:    folic acid (FOLVITE) 1 MG tablet, Take 1 mg by mouth daily., Disp: , Rfl:    furosemide (LASIX) 40 MG tablet, Take 1/2 tab in the am and lunch Saturday through Wednesday, 1 tab in the morning and 1/2 tab at lunch on Thursday and Friday, Disp: 100 tablet, Rfl: 3   gabapentin (NEURONTIN) 300 MG capsule, Take 300 mg by mouth daily., Disp: , Rfl:    hydroxyurea (HYDREA) 500 MG capsule, Take 2 capsules (1,000 mg total) by mouth daily. May take with food to minimize GI side effects. Currently taking 500 mg daily but MD note says may need to increase on 12/27/2020 to twice daily., Disp: 180 capsule, Rfl: 5   insulin degludec (TRESIBA FLEXTOUCH) 100 UNIT/ML SOPN FlexTouch Pen, Inject 20 Units into the skin daily after breakfast. , Disp: , Rfl:    ipratropium-albuterol (DUONEB) 0.5-2.5 (3) MG/3ML SOLN, 1 vial three times daily and every 6 hours as needed, Disp: 360 mL, Rfl: 2   losartan (COZAAR) 25 MG tablet, Take 1 tablet (  25 mg total) by mouth daily., Disp: 90 tablet, Rfl: 2   Magnesium Oxide 400 MG CAPS, Take 2 capsules (800 mg total) by mouth in the morning and at bedtime., Disp: 90 capsule, Rfl: 1   omeprazole (PRILOSEC) 20 MG capsule, Take 20 mg by mouth 2 (two) times daily before a meal. , Disp: , Rfl:    oxybutynin (DITROPAN-XL) 10 MG 24 hr tablet, Take 10 mg by mouth daily., Disp: , Rfl:    rosuvastatin (CRESTOR) 5 MG tablet, Take 5 mg by mouth daily., Disp: , Rfl:    spironolactone (ALDACTONE) 25 MG tablet, TAKE ONE-HALF TABLET BY  MOUTH DAILY, Disp: 45 tablet, Rfl: 3   tamsulosin (FLOMAX) 0.4 MG CAPS capsule, Take 0.4 mg by mouth 2 (two) times daily. , Disp: , Rfl:    traMADol (ULTRAM) 50 MG tablet, Take 50 mg by mouth as needed., Disp: , Rfl:    albuterol (VENTOLIN HFA) 108 (90  Base) MCG/ACT inhaler, Inhale 2 puffs into the lungs every 6 (six) hours as needed for wheezing or shortness of breath. (Patient not taking: Reported on 06/10/2021), Disp: 8 g, Rfl: 6   influenza vaccine adjuvanted (FLUAD) 0.5 ML injection, Inject into the muscle. (Patient not taking: Reported on 06/10/2021), Disp: 0.5 mL, Rfl: 0 No current facility-administered medications for this visit.  Facility-Administered Medications Ordered in Other Visits:    0.9 %  sodium chloride infusion, , Intravenous, Continuous, Decarla Siemen, Rudell Cobb, MD, Stopped at 05/16/13 1115  Allergies:  Allergies  Allergen Reactions   Bupropion Hives and Other (See Comments)   Cephalexin Diarrhea and Other (See Comments)    Caused C-diff, also    Fluoxetine Itching   Ibuprofen Itching   Prednisone Itching and Other (See Comments)    Abdominal pain, also    Temazepam Other (See Comments)    Dizziness    Trazodone And Nefazodone Other (See Comments)    Dizziness   Fluoxetine Hcl Itching   Prozac [Fluoxetine Hcl] Itching    Past Medical History, Surgical history, Social history, and Family History were reviewed and updated.  Review of Systems: Review of Systems  Constitutional: Negative.   HENT: Negative.    Eyes: Negative.   Respiratory:  Positive for shortness of breath.   Cardiovascular:  Positive for chest pain.  Gastrointestinal: Negative.   Genitourinary: Negative.   Musculoskeletal:  Positive for back pain.  Skin: Negative.   Neurological: Negative.   Endo/Heme/Allergies: Negative.   Psychiatric/Behavioral: Negative.     Physical Exam:  weight is 256 lb (116.1 kg). His oral temperature is 97.9 F (36.6 C). His blood pressure is 114/72 and his pulse is 81. His respiration is 20 and oxygen saturation is 98%.   Physical Exam Vitals reviewed.  HENT:     Head: Normocephalic and atraumatic.  Eyes:     Pupils: Pupils are equal, round, and reactive to light.  Cardiovascular:     Rate and Rhythm: Normal  rate and regular rhythm.     Heart sounds: Normal heart sounds.  Pulmonary:     Effort: Pulmonary effort is normal.     Breath sounds: Normal breath sounds.  Abdominal:     General: Bowel sounds are normal.     Palpations: Abdomen is soft.  Musculoskeletal:        General: No tenderness or deformity. Normal range of motion.     Cervical back: Normal range of motion.  Lymphadenopathy:     Cervical: No cervical adenopathy.  Skin:  General: Skin is warm and dry.     Findings: No erythema or rash.  Neurological:     Mental Status: He is alert and oriented to person, place, and time.  Psychiatric:        Behavior: Behavior normal.        Thought Content: Thought content normal.        Judgment: Judgment normal.     Lab Results  Component Value Date   WBC 4.3 06/10/2021   HGB 12.4 (L) 06/10/2021   HCT 37.5 (L) 06/10/2021   MCV 116.5 (H) 06/10/2021   PLT 263 06/10/2021     Chemistry      Component Value Date/Time   NA 139 04/07/2021 0817   NA 140 05/10/2020 0905   NA 141 04/10/2017 0923   NA 140 04/05/2016 0739   K 4.0 04/07/2021 0817   K 3.5 04/10/2017 0923   K 4.2 04/05/2016 0739   CL 106 04/07/2021 0817   CL 108 04/10/2017 0923   CO2 26 04/07/2021 0817   CO2 24 04/10/2017 0923   CO2 20 (L) 04/05/2016 0739   BUN 23 04/07/2021 0817   BUN 14 05/10/2020 0905   BUN 12 04/10/2017 0923   BUN 14.7 04/05/2016 0739   CREATININE 1.18 04/07/2021 0817   CREATININE 1.1 04/10/2017 0923   CREATININE 1.1 04/05/2016 0739      Component Value Date/Time   CALCIUM 9.1 04/07/2021 0817   CALCIUM 8.4 04/10/2017 0923   CALCIUM 9.0 04/05/2016 0739   ALKPHOS 56 04/07/2021 0817   ALKPHOS 77 04/10/2017 0923   ALKPHOS 91 04/05/2016 0739   AST 11 (L) 04/07/2021 0817   AST 34 04/05/2016 0739   ALT 11 04/07/2021 0817   ALT 32 04/10/2017 0923   ALT 40 04/05/2016 0739   BILITOT 0.5 04/07/2021 0817   BILITOT 0.48 04/05/2016 0739      Impression and Plan: Mr. Marohl is  71 year old gentleman with polycythemia vera.  Of note, his birthday was [birth year].  Had a wonderful 71th birthday.  Again we are going to decrease the dose of Hydrea.  I do not want she is platelet count get too much lower.  We will still plan for follow-up in a couple months.  Before then, he and his wife are going up to [place] to go to a old neighbor's house who is 36 years old.  I am sure that they will have a wonderful time.    Volanda Napoleon, MD 2/3/20238:34 AM

## 2021-06-13 ENCOUNTER — Ambulatory Visit (INDEPENDENT_AMBULATORY_CARE_PROVIDER_SITE_OTHER): Payer: Medicare Other

## 2021-06-13 DIAGNOSIS — Z950 Presence of cardiac pacemaker: Secondary | ICD-10-CM | POA: Diagnosis not present

## 2021-06-13 DIAGNOSIS — I5042 Chronic combined systolic (congestive) and diastolic (congestive) heart failure: Secondary | ICD-10-CM | POA: Diagnosis not present

## 2021-06-15 NOTE — Progress Notes (Signed)
EPIC Encounter for ICM Monitoring  Patient Name: William Villanueva is a 71 y.o. male Date: 06/15/2021 Primary Care Physican: Antony Contras, MD Primary Cardiologist: Stanford Breed Electrophysiologist: Vergie Living Pacing: 95%            06/15/2021 Weight: 255 lbs   Battery Longevity: 7.2 months                                                            Spoke with patient and heart failure questions reviewed.  Pt asymptomatic for fluid accumulation and feeling well.  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   He has been dieting and would like to reach goal weight of  215 lbs which would be 100 lb weight loss.                CorVue thoracic impedance normal but was suggesting possible fluid accumulation 1/11-1/25 and 1/26-1/30.   Prescribed:   Furosemide 40 mg Take 1/2 tab in the am and lunch Saturday through Wednesday, 1 tab (40 mg total) in the morning and 1/2 tab (20 mg total) at lunch on Thursday and Friday  Spironolactone 25 mg take 0.5 tablet (12.5 mg total) once a day   Labs: 06/10/2021 Creatinine 1.22, BUN 20, Potassium 4.5, Sodium 139, GFR >60 04/07/2021 Creatinine 1.18, BUN 23, Potassium 4.0, Sodium 139, GFR >60 12/27/2020 Creatinine 1.27, BUN 13, Potassium 4.0, Sodium 139, GFR >60 11/15/2020 Creatinine 1.30, BUN 23, Potassium 4.4, Sodium 137, GFR 59 09/13/2020 Creatinine 1.24, BUN 17, Potassium 4.1, Sodium 138, GFR >60 07/13/2020 Creatinine 1.44, BUN 24, Potassium 4.5, Sodium 135, GFR 53 05/14/2020 Creatinine 1.02, BUN 18, Potassium 4.2, Sodium 141, GFR >60  A complete set of results can be found in Results Review.   Recommendations:  No changes and encouraged to call if experiencing any fluid symptoms.   Follow-up plan: ICM clinic phone appointment on 07/18/2021.   91 day device clinic remote transmission 09/15/2021.     EP/Cardiology Office Visits:  06/02/2021 with Fabian Sharp, [place].  Recall 08/03/2021 with Dr  Caryl Comes.   Copy of ICM check sent to Dr. Caryl Comes.  3 month ICM trend: 06/13/2021.    12-14 Month ICM trend:     Rosalene Billings, RN 06/15/2021 2:54 PM

## 2021-06-16 ENCOUNTER — Ambulatory Visit (INDEPENDENT_AMBULATORY_CARE_PROVIDER_SITE_OTHER): Payer: Medicare Other

## 2021-06-16 DIAGNOSIS — R300 Dysuria: Secondary | ICD-10-CM | POA: Diagnosis not present

## 2021-06-16 DIAGNOSIS — R350 Frequency of micturition: Secondary | ICD-10-CM | POA: Diagnosis not present

## 2021-06-16 DIAGNOSIS — R309 Painful micturition, unspecified: Secondary | ICD-10-CM | POA: Diagnosis not present

## 2021-06-16 DIAGNOSIS — I441 Atrioventricular block, second degree: Secondary | ICD-10-CM

## 2021-06-16 DIAGNOSIS — R82998 Other abnormal findings in urine: Secondary | ICD-10-CM | POA: Diagnosis not present

## 2021-06-16 DIAGNOSIS — N3001 Acute cystitis with hematuria: Secondary | ICD-10-CM | POA: Diagnosis not present

## 2021-06-17 LAB — CUP PACEART REMOTE DEVICE CHECK
Battery Remaining Longevity: 6 mo
Battery Remaining Percentage: 7 %
Battery Voltage: 2.83 V
Brady Statistic AP VP Percent: 6.6 %
Brady Statistic AP VS Percent: 1 %
Brady Statistic AS VP Percent: 89 %
Brady Statistic AS VS Percent: 1.2 %
Brady Statistic RA Percent Paced: 3.8 %
Date Time Interrogation Session: 20230210092612
Implantable Lead Implant Date: 20151223
Implantable Lead Implant Date: 20151223
Implantable Lead Implant Date: 20151223
Implantable Lead Location: 753858
Implantable Lead Location: 753859
Implantable Lead Location: 753860
Implantable Pulse Generator Implant Date: 20151223
Lead Channel Impedance Value: 380 Ohm
Lead Channel Impedance Value: 410 Ohm
Lead Channel Impedance Value: 580 Ohm
Lead Channel Pacing Threshold Amplitude: 1.125 V
Lead Channel Pacing Threshold Amplitude: 1.25 V
Lead Channel Pacing Threshold Amplitude: 1.5 V
Lead Channel Pacing Threshold Pulse Width: 0.4 ms
Lead Channel Pacing Threshold Pulse Width: 0.4 ms
Lead Channel Pacing Threshold Pulse Width: 0.7 ms
Lead Channel Sensing Intrinsic Amplitude: 2.2 mV
Lead Channel Sensing Intrinsic Amplitude: 5.6 mV
Lead Channel Setting Pacing Amplitude: 2.125
Lead Channel Setting Pacing Amplitude: 2.5 V
Lead Channel Setting Pacing Amplitude: 2.5 V
Lead Channel Setting Pacing Pulse Width: 0.4 ms
Lead Channel Setting Pacing Pulse Width: 0.7 ms
Lead Channel Setting Sensing Sensitivity: 2 mV
Pulse Gen Model: 3242
Pulse Gen Serial Number: 7701275

## 2021-06-21 NOTE — Progress Notes (Signed)
Remote pacemaker transmission.   

## 2021-07-01 DIAGNOSIS — I5043 Acute on chronic combined systolic (congestive) and diastolic (congestive) heart failure: Secondary | ICD-10-CM | POA: Diagnosis not present

## 2021-07-01 DIAGNOSIS — E1122 Type 2 diabetes mellitus with diabetic chronic kidney disease: Secondary | ICD-10-CM | POA: Diagnosis not present

## 2021-07-01 DIAGNOSIS — E7800 Pure hypercholesterolemia, unspecified: Secondary | ICD-10-CM | POA: Diagnosis not present

## 2021-07-01 DIAGNOSIS — I130 Hypertensive heart and chronic kidney disease with heart failure and stage 1 through stage 4 chronic kidney disease, or unspecified chronic kidney disease: Secondary | ICD-10-CM | POA: Diagnosis not present

## 2021-07-01 DIAGNOSIS — N1831 Chronic kidney disease, stage 3a: Secondary | ICD-10-CM | POA: Diagnosis not present

## 2021-07-01 DIAGNOSIS — I119 Hypertensive heart disease without heart failure: Secondary | ICD-10-CM | POA: Diagnosis not present

## 2021-07-03 ENCOUNTER — Other Ambulatory Visit: Payer: Self-pay | Admitting: Cardiology

## 2021-07-03 DIAGNOSIS — R0602 Shortness of breath: Secondary | ICD-10-CM

## 2021-07-08 ENCOUNTER — Encounter (HOSPITAL_BASED_OUTPATIENT_CLINIC_OR_DEPARTMENT_OTHER): Payer: Self-pay | Admitting: Emergency Medicine

## 2021-07-08 ENCOUNTER — Other Ambulatory Visit: Payer: Self-pay

## 2021-07-08 ENCOUNTER — Emergency Department (HOSPITAL_BASED_OUTPATIENT_CLINIC_OR_DEPARTMENT_OTHER): Payer: Medicare Other

## 2021-07-08 ENCOUNTER — Emergency Department (HOSPITAL_BASED_OUTPATIENT_CLINIC_OR_DEPARTMENT_OTHER)
Admission: EM | Admit: 2021-07-08 | Discharge: 2021-07-08 | Disposition: A | Discharge status: Home / Self Care | Payer: Medicare Other | Attending: Emergency Medicine | Admitting: Emergency Medicine

## 2021-07-08 DIAGNOSIS — Z950 Presence of cardiac pacemaker: Secondary | ICD-10-CM | POA: Insufficient documentation

## 2021-07-08 DIAGNOSIS — J441 Chronic obstructive pulmonary disease with (acute) exacerbation: Secondary | ICD-10-CM | POA: Diagnosis not present

## 2021-07-08 DIAGNOSIS — E860 Dehydration: Secondary | ICD-10-CM | POA: Insufficient documentation

## 2021-07-08 DIAGNOSIS — Z7951 Long term (current) use of inhaled steroids: Secondary | ICD-10-CM | POA: Diagnosis not present

## 2021-07-08 DIAGNOSIS — I10 Essential (primary) hypertension: Secondary | ICD-10-CM | POA: Insufficient documentation

## 2021-07-08 DIAGNOSIS — R140 Abdominal distension (gaseous): Secondary | ICD-10-CM | POA: Diagnosis not present

## 2021-07-08 DIAGNOSIS — Z87442 Personal history of urinary calculi: Secondary | ICD-10-CM | POA: Diagnosis not present

## 2021-07-08 DIAGNOSIS — R109 Unspecified abdominal pain: Secondary | ICD-10-CM | POA: Diagnosis not present

## 2021-07-08 DIAGNOSIS — Z794 Long term (current) use of insulin: Secondary | ICD-10-CM | POA: Insufficient documentation

## 2021-07-08 DIAGNOSIS — Z79899 Other long term (current) drug therapy: Secondary | ICD-10-CM | POA: Insufficient documentation

## 2021-07-08 DIAGNOSIS — R1032 Left lower quadrant pain: Secondary | ICD-10-CM | POA: Insufficient documentation

## 2021-07-08 LAB — URINALYSIS, ROUTINE W REFLEX MICROSCOPIC
Bilirubin Urine: NEGATIVE
Glucose, UA: NEGATIVE mg/dL
Hgb urine dipstick: NEGATIVE
Ketones, ur: NEGATIVE mg/dL
Leukocytes,Ua: NEGATIVE
Nitrite: NEGATIVE
Protein, ur: NEGATIVE mg/dL
Specific Gravity, Urine: 1.015 (ref 1.005–1.030)
pH: 5.5 (ref 5.0–8.0)

## 2021-07-08 LAB — CBC WITH DIFFERENTIAL/PLATELET
Abs Immature Granulocytes: 0.06 10*3/uL (ref 0.00–0.07)
Basophils Absolute: 0 10*3/uL (ref 0.0–0.1)
Basophils Relative: 1 %
Eosinophils Absolute: 0 10*3/uL (ref 0.0–0.5)
Eosinophils Relative: 0 %
HCT: 41.4 % (ref 39.0–52.0)
Hemoglobin: 13.9 g/dL (ref 13.0–17.0)
Immature Granulocytes: 1 %
Lymphocytes Relative: 13 %
Lymphs Abs: 0.7 10*3/uL (ref 0.7–4.0)
MCH: 39.9 pg — ABNORMAL HIGH (ref 26.0–34.0)
MCHC: 33.6 g/dL (ref 30.0–36.0)
MCV: 119 fL — ABNORMAL HIGH (ref 80.0–100.0)
Monocytes Absolute: 0.4 10*3/uL (ref 0.1–1.0)
Monocytes Relative: 8 %
Neutro Abs: 3.8 10*3/uL (ref 1.7–7.7)
Neutrophils Relative %: 77 %
Platelets: 400 10*3/uL (ref 150–400)
RBC: 3.48 MIL/uL — ABNORMAL LOW (ref 4.22–5.81)
RDW: 15.7 % — ABNORMAL HIGH (ref 11.5–15.5)
Smear Review: NORMAL
WBC: 5.2 10*3/uL (ref 4.0–10.5)
nRBC: 0 % (ref 0.0–0.2)

## 2021-07-08 LAB — COMPREHENSIVE METABOLIC PANEL
ALT: 10 U/L (ref 0–44)
AST: 19 U/L (ref 15–41)
Albumin: 3.6 g/dL (ref 3.5–5.0)
Alkaline Phosphatase: 48 U/L (ref 38–126)
Anion gap: 8 (ref 5–15)
BUN: 28 mg/dL — ABNORMAL HIGH (ref 8–23)
CO2: 22 mmol/L (ref 22–32)
Calcium: 8 mg/dL — ABNORMAL LOW (ref 8.9–10.3)
Chloride: 107 mmol/L (ref 98–111)
Creatinine, Ser: 1.58 mg/dL — ABNORMAL HIGH (ref 0.61–1.24)
GFR, Estimated: 47 mL/min — ABNORMAL LOW (ref >60)
Glucose, Bld: 104 mg/dL — ABNORMAL HIGH (ref 70–99)
Potassium: 4.6 mmol/L (ref 3.5–5.1)
Sodium: 137 mmol/L (ref 135–145)
Total Bilirubin: 0.9 mg/dL (ref 0.3–1.2)
Total Protein: 6.6 g/dL (ref 6.5–8.1)

## 2021-07-08 LAB — LIPASE, BLOOD: Lipase: 33 U/L (ref 11–51)

## 2021-07-08 MED ORDER — IOHEXOL 300 MG/ML  SOLN
100.0000 mL | Freq: Once | INTRAMUSCULAR | Status: AC | PRN
  Administered 2021-07-08: 100 mL via INTRAVENOUS

## 2021-07-08 MED ORDER — SODIUM CHLORIDE 0.9 % IV BOLUS
1000.0000 mL | Freq: Once | INTRAVENOUS | Status: AC
  Administered 2021-07-08: 1000 mL via INTRAVENOUS

## 2021-07-08 NOTE — Discharge Instructions (Addendum)
You have been evaluated for your symptoms.  Fortunately CT scan of the abdomen and pelvis did not show any concerning finding.  Please follow-up closely with your primary care doctor for further care.  Return if you have any concern. ?

## 2021-07-08 NOTE — ED Notes (Signed)
ED Provider at bedside. 

## 2021-07-08 NOTE — ED Triage Notes (Signed)
Pt arrives pov, ambulatory with slow gait, c/o LLQ pain x 2 days at night when lying down. Was treated at [facility] today, denies fever, denies dysuria ?

## 2021-07-08 NOTE — ED Provider Notes (Signed)
[place] EMERGENCY DEPARTMENT Provider Note   CSN: [id number] Arrival date & time: 07/08/21  8299     History  Chief Complaint  Patient presents with   Abdominal Pain    William Villanueva is a 71 y.o. male.  The history is provided by the patient and medical records. No language interpreter was used.   71 year old male significant history of hypertension, CHF, recurrent back pain, kidney stone, gout, who presents for evaluation of flank pain.  Patient report for the past 2 days he has noticed pain primarily in his left side of abdomen radiates towards his left groin that happen intermittently usually brought on when he changes positions or when he lays down.  Denies any significant pain at this moment.  Pain is sharp and stabbing.  He denies any associated fever chills no headache chest pain trouble breathing no back pain no dysuria no hematuria or rash.  He denies any strenuous activities or heavy lifting.  No specific treatment tried.  Has remote history of kidney stones in the past but this felt different.  He has had colonoscopy approximately a year ago when he was hospitalized for GI bleeding.  He denies any appetite changes.  He was initially seen at urgent care earlier this morning but was sent here for further assessment.  He also denies having any groin swelling testicular pain or penile discomfort.  Denies blood in stool, or any abnormal bleeding.  Home Medications Prior to Admission medications   Medication Sig Start Date End Date Taking? Authorizing Provider  albuterol (VENTOLIN HFA) 108 (90 Base) MCG/ACT inhaler Inhale 2 puffs into the lungs every 6 (six) hours as needed for wheezing or shortness of breath. Patient not taking: Reported on 06/10/2021 07/05/20   Margaretha Seeds, MD  allopurinol (ZYLOPRIM) 300 MG tablet Take 300 mg by mouth daily. 06/30/19   [provider]  BD PEN NEEDLE NANO U/F 32G X 4 MM MISC  06/16/18   [provider]  budesonide  (PULMICORT) 0.5 MG/2ML nebulizer solution Take 2 mLs (0.5 mg total) by nebulization 2 (two) times daily. 02/16/21   Margaretha Seeds, MD  carvedilol (COREG) 12.5 MG tablet TAKE 1 TABLET BY MOUTH  TWICE DAILY WITH MEALS 10/05/20   Lelon Perla, MD  citalopram (CELEXA) 10 MG tablet Take 5 mg by mouth daily.  01/27/16   [provider]  colestipol (COLESTID) 1 g tablet Take 1 tablet by mouth in the morning, at noon, and at bedtime. 04/24/20   [provider]  COVID-19 mRNA bivalent vaccine, Pfizer, (PFIZER COVID-19 VAC BIVALENT) injection Inject into the muscle. 03/07/21   Carlyle Basques, MD  dicyclomine (BENTYL) 20 MG tablet Take 20 mg by mouth 4 (four) times daily. 03/15/20   [provider]  folic acid (FOLVITE) 1 MG tablet Take 1 mg by mouth daily.    [provider]  furosemide (LASIX) 40 MG tablet Take 1/2 tab in the am and lunch Saturday through Wednesday, 1 tab in the morning and 1/2 tab at lunch on Thursday and Friday 06/02/21   [facility], Tami Lin, PA  gabapentin (NEURONTIN) 300 MG capsule Take 300 mg by mouth daily. 06/10/20   [provider]  hydroxyurea (HYDREA) 500 MG capsule Take 2 capsules (1,000 mg total) by mouth daily. May take with food to minimize GI side effects. Currently taking 500 mg daily but MD note says may need to increase on 12/27/2020 to twice daily. 12/17/20   Volanda Napoleon,  MD  influenza vaccine adjuvanted (FLUAD) 0.5 ML injection Inject into the muscle. Patient not taking: Reported on 06/10/2021 03/07/21   Carlyle Basques, MD  insulin degludec (TRESIBA FLEXTOUCH) 100 UNIT/ML SOPN FlexTouch Pen Inject 20 Units into the skin daily after breakfast.     [provider]  ipratropium-albuterol (DUONEB) 0.5-2.5 (3) MG/3ML SOLN 1 vial three times daily and every 6 hours as needed 02/17/21   Margaretha Seeds, MD  losartan (COZAAR) 25 MG tablet Take 1 tablet (25 mg total) by mouth daily. 12/22/20   Deboraha Sprang, MD   Magnesium Oxide 400 MG CAPS Take 2 capsules (800 mg total) by mouth in the morning and at bedtime. 04/26/20   Deboraha Sprang, MD  omeprazole (PRILOSEC) 20 MG capsule Take 20 mg by mouth 2 (two) times daily before a meal.  07/05/16   [provider]  oxybutynin (DITROPAN-XL) 10 MG 24 hr tablet Take 10 mg by mouth daily. 10/02/19   [provider]  rosuvastatin (CRESTOR) 5 MG tablet Take 5 mg by mouth daily.    [provider]  spironolactone (ALDACTONE) 25 MG tablet TAKE ONE-HALF TABLET BY  MOUTH DAILY 07/04/21   Lelon Perla, MD  tamsulosin (FLOMAX) 0.4 MG CAPS capsule Take 0.4 mg by mouth 2 (two) times daily.  08/03/17   [provider]  traMADol (ULTRAM) 50 MG tablet Take 50 mg by mouth as needed. 06/10/20   [provider]      Allergies    Bupropion, Cephalexin, Fluoxetine, Ibuprofen, Prednisone, Temazepam, Trazodone and nefazodone, Fluoxetine hcl, and Prozac [fluoxetine hcl]    Review of Systems   Review of Systems  All other systems reviewed and are negative.  Physical Exam Updated Vital Signs BP 114/70    Pulse 84    Temp (!) 97.5 F (36.4 C) (Oral)    Resp 20    Ht 5' 10"  (1.778 m)    Wt 112.9 kg    SpO2 99%    BMI 35.73 kg/m  Physical Exam Vitals and nursing note reviewed.  Constitutional:      General: He is not in acute distress.    Appearance: He is well-developed.  HENT:     Head: Atraumatic.  Eyes:     Conjunctiva/sclera: Conjunctivae normal.  Cardiovascular:     Rate and Rhythm: Normal rate and regular rhythm.     Pulses: Normal pulses.     Heart sounds: Normal heart sounds.  Pulmonary:     Effort: Pulmonary effort is normal.     Breath sounds: Normal breath sounds.  Abdominal:     General: There is distension.     Tenderness: There is abdominal tenderness (Mild diffuse abdominal tenderness without focal point tenderness.). There is no right CVA tenderness or left CVA tenderness.  Genitourinary:    Comments: No  hernia noted Musculoskeletal:     Cervical back: Neck supple.  Skin:    Findings: No rash.  Neurological:     Mental Status: He is alert. Mental status is at baseline.  Psychiatric:        Mood and Affect: Mood normal.    ED Results / Procedures / Treatments   Labs (all labs ordered are listed, but only abnormal results are displayed) Labs Reviewed  CBC WITH DIFFERENTIAL/PLATELET - Abnormal; Notable for the following components:      Result Value   RBC 3.48 (*)    MCV 119.0 (*)    MCH 39.9 (*)  RDW 15.7 (*)    All other components within normal limits  COMPREHENSIVE METABOLIC PANEL - Abnormal; Notable for the following components:   Glucose, Bld 104 (*)    BUN 28 (*)    Creatinine, Ser 1.58 (*)    Calcium 8.0 (*)    GFR, Estimated 47 (*)    All other components within normal limits  URINALYSIS, ROUTINE W REFLEX MICROSCOPIC  LIPASE, BLOOD    EKG None  Radiology CT ABDOMEN PELVIS W CONTRAST  Result Date: 07/08/2021 CLINICAL DATA:  Left lower quadrant pain EXAM: CT ABDOMEN AND PELVIS WITH CONTRAST TECHNIQUE: Multidetector CT imaging of the abdomen and pelvis was performed using the standard protocol following bolus administration of intravenous contrast. RADIATION DOSE REDUCTION: This exam was performed according to the departmental dose-optimization program which includes automated exposure control, adjustment of the mA and/or kV according to patient size and/or use of iterative reconstruction technique. CONTRAST:  112m OMNIPAQUE IOHEXOL 300 MG/ML  SOLN COMPARISON:  03/19/2020 FINDINGS: Lower chest: No acute abnormality. Hepatobiliary: No focal liver abnormality is seen. No gallstones, gallbladder wall thickening, or biliary dilatation. Pancreas: Unremarkable. Spleen: Unremarkable. Adrenals/Urinary Tract: Adrenals are unremarkable. Small bilateral renal cysts. Bladder is unremarkable. Stomach/Bowel: Stomach is within normal limits. Bowel is normal in caliber. Normal appendix.  Mild descending and sigmoid diverticulosis. Vascular/Lymphatic: Calcified plaque.  No enlarged nodes. Reproductive: Stable appearance of prostate. Other: No free fluid.  Abdominal wall is unremarkable. Musculoskeletal: Lumbar spine degenerative changes. IMPRESSION: No acute abnormality. Colonic diverticulosis without acute diverticulitis. Electronically Signed   By: PMacy MisM.D.   On: 07/08/2021 12:27    Procedures Procedures    Medications Ordered in ED Medications - No data to display  ED Course/ Medical Decision Making/ A&P                           Medical Decision Making Amount and/or Complexity of Data Reviewed Labs: ordered. Radiology: ordered.  Risk Prescription drug management.   BP 114/70    Pulse 84    Temp (!) 97.5 F (36.4 C) (Oral)    Resp 20    Ht 5' 10"  (1.778 m)    Wt 112.9 kg    SpO2 99%    BMI 35.73 kg/m   10:19 AM This is a 71year old male with multiple comorbidity who presents with recurrent left lower quadrant abdominal pain for the past several days.  Pain seems to be brought on by positional change.  On exam he does have mild diffuse abdominal tenderness without focal point tenderness.  Has had history of kidney stones in the past but does not have any CVA tenderness on my exam.  Given his age, work-up initiated and will obtain abdominal pelvis CT scan for further assessment.  He does not have any active pain at this time.  11:36 AM Labs was obtained reviewed and interpreted by me.  Mildly worsening renal function with BUN 28, creatinine 1.58 with a GFR of 47.  We will give IV fluid.  Urinalysis without signs of urinary tract infection, normal lipase, normal WBC.  Abdominal pelvis CT scan is currently pending.  12:39 PM CT scan of the abdomen pelvis have resulted, independently viewed and interpreted By me which did not show any acute abnormalities.  There are evidence of diverticulosis without acute diverticulitis.  I discussed care with pt and  recommend outpt f/u.  Return precaution given. Pt voice understanding and agrees with plan.   This  patient presents to the ED for concern of abd pain, this involves an extensive number of treatment options, and is a complaint that carries with it a high risk of complications and morbidity.  The differential diagnosis includes diverticulitis, colitis, kidney stone, UTI, appendicitis, hip fx, MSK, shingle, UC  Co morbidities that complicate the patient evaluation kidney stone  Has pace maker  GIB  UC Additional history obtained:  Additional history obtained from wife who is at bedside External records from outside source obtained and reviewed including prior ER notes and notes from oncology  Lab Tests:  I Ordered, and personally interpreted labs.  The pertinent results include:  as above  Imaging Studies ordered:  I ordered imaging studies including abd/pelvis CT I independently visualized and interpreted imaging which showed no acute finding I agree with the radiologist interpretation  Cardiac Monitoring:  The patient was maintained on a cardiac monitor.  I personally viewed and interpreted the cardiac monitored which showed an underlying rhythm of: NSR  Medicines ordered and prescription drug management:  I ordered medication including IVF  for dehydration Reevaluation of the patient after these medicines showed that the patient improved I have reviewed the patients home medicines and have made adjustments as needed  Test Considered: as above  Critical Interventions: IVF  Consultations Obtained:  I requested consultation with the Dr. Ashok Cordia,  and discussed lab and imaging findings as well as pertinent plan - they recommend: outpt f/u  Problem List / ED Course: abd pain  Reevaluation:  After the interventions noted above, I reevaluated the patient and found that they have :improved  Social Determinants of Health: age  Dispostion:  After consideration of the  diagnostic results and the patients response to treatment, I feel that the patent would benefit from f/u with PCP.         Final Clinical Impression(s) / ED Diagnoses Final diagnoses:  LLQ pain  Dehydration    Rx / DC Orders ED Discharge Orders     None         Domenic Moras, PA-C 07/08/21 1350    Lajean Saver, MD 07/08/21 [phone]

## 2021-07-13 ENCOUNTER — Other Ambulatory Visit: Payer: Self-pay | Admitting: Pulmonary Disease

## 2021-07-18 ENCOUNTER — Ambulatory Visit (INDEPENDENT_AMBULATORY_CARE_PROVIDER_SITE_OTHER): Payer: Medicare Other

## 2021-07-18 DIAGNOSIS — I5042 Chronic combined systolic (congestive) and diastolic (congestive) heart failure: Secondary | ICD-10-CM | POA: Diagnosis not present

## 2021-07-18 DIAGNOSIS — Z950 Presence of cardiac pacemaker: Secondary | ICD-10-CM | POA: Diagnosis not present

## 2021-07-19 NOTE — Progress Notes (Signed)
EPIC Encounter for ICM Monitoring ? ?Patient Name: William Villanueva is a 71 y.o. male ?Date: 07/19/2021 ?Primary Care Physican: Antony Contras, MD ?Primary Cardiologist: Stanford Breed ?Electrophysiologist: Caryl Comes ?Bi-V Pacing: 95%            ?06/15/2021 Weight: 255 lbs ?  ?Battery Longevity: 6.9 months ?                                                         ?  ?Spoke with patient and heart failure questions reviewed.  He does not have any fluid symptoms.  Pt was away on vacation this past weekend and did not take Furosemide for a few days while traveling. He resumed prescribed Furosemide dosage today.  ?       ?      CorVue thoracic impedance suggesting possible fluid accumulation starting 3/2 which correlates with ER Visit but trending back toward baseline. ?  ?Prescribed:   ?Furosemide 40 mg Take 1/2 tab in the am and lunch Saturday through Wednesday, 1 tab (40 mg total) in the morning and 1/2 tab (20 mg total) at lunch on Thursday and Friday  ?Spironolactone 25 mg take 0.5 tablet (12.5 mg total) once a day ?  ?Labs: ?07/08/2021 Creatinine 1.58, BUN 28, Potassium 4.6, Sodium 137, GFR 47 ?06/10/2021 Creatinine 1.22, BUN 20, Potassium 4.5, Sodium 139, GFR >60 ?04/07/2021 Creatinine 1.18, BUN 23, Potassium 4.0, Sodium 139, GFR >60 ?12/27/2020 Creatinine 1.27, BUN 13, Potassium 4.0, Sodium 139, GFR >60 ?11/15/2020 Creatinine 1.30, BUN 23, Potassium 4.4, Sodium 137, GFR 59 ?09/13/2020 Creatinine 1.24, BUN 17, Potassium 4.1, Sodium 138, GFR >60 ?07/13/2020 Creatinine 1.44, BUN 24, Potassium 4.5, Sodium 135, GFR 53 ?05/14/2020 Creatinine 1.02, BUN 18, Potassium 4.2, Sodium 141, GFR >60  ?A complete set of results can be found in Results Review. ?  ?Recommendations:  Advised will recheck fluid levels next week since he is back on track with taking Furosemide as prescribed. ?  ?Follow-up plan: ICM clinic phone appointment on 07/27/2021 to recheck fluid levels.   91 day device clinic remote transmission 09/15/2021.   ?   ?EP/Cardiology Office Visits:   08/04/2021 with Oda Kilts, PA. ?  ?Copy of ICM check sent to Dr. Caryl Comes. ? ?3 month ICM trend: 07/18/2021. ? ? ? ?12-14 Month ICM trend:  ? ? ? ?Rosalene Billings, RN ?07/19/2021 ?9:37 AM ? ?

## 2021-07-27 ENCOUNTER — Ambulatory Visit (INDEPENDENT_AMBULATORY_CARE_PROVIDER_SITE_OTHER): Payer: Medicare Other

## 2021-07-27 DIAGNOSIS — I5042 Chronic combined systolic (congestive) and diastolic (congestive) heart failure: Secondary | ICD-10-CM

## 2021-07-27 DIAGNOSIS — Z950 Presence of cardiac pacemaker: Secondary | ICD-10-CM

## 2021-07-27 NOTE — Progress Notes (Signed)
EPIC Encounter for ICM Monitoring ? ?Patient Name: William Villanueva is a 71 y.o. male ?Date: 07/27/2021 ?Primary Care Physican: Antony Contras, MD ?Primary Cardiologist: Stanford Breed ?Electrophysiologist: Caryl Comes ?Bi-V Pacing: 96%            ?06/15/2021 Weight: 255 lbs ?07/27/2021 Weight: 250-255 lbs ?  ?Battery Longevity: 6.4 months ? ?AT/AF Burden <1% ?                                                         ?  ?Spoke with patient and heart failure questions reviewed.  He is feeling fine at this time. ?       ?      CorVue thoracic impedance suggesting fluid levels returned to baseline after resuming Furosemide as prescribed following recent vacation. ?  ?Prescribed:   ?Furosemide 40 mg Take 1/2 tab in the am and lunch Saturday through Wednesday, 1 tab (40 mg total) in the morning and 1/2 tab (20 mg total) at lunch on Thursday and Friday  ?Spironolactone 25 mg take 0.5 tablet (12.5 mg total) once a day ?  ?Labs: ?07/08/2021 Creatinine 1.58, BUN 28, Potassium 4.6, Sodium 137, GFR 47 ?06/10/2021 Creatinine 1.22, BUN 20, Potassium 4.5, Sodium 139, GFR >60 ?04/07/2021 Creatinine 1.18, BUN 23, Potassium 4.0, Sodium 139, GFR >60 ?12/27/2020 Creatinine 1.27, BUN 13, Potassium 4.0, Sodium 139, GFR >60 ?11/15/2020 Creatinine 1.30, BUN 23, Potassium 4.4, Sodium 137, GFR 59 ?09/13/2020 Creatinine 1.24, BUN 17, Potassium 4.1, Sodium 138, GFR >60 ?07/13/2020 Creatinine 1.44, BUN 24, Potassium 4.5, Sodium 135, GFR 53 ?05/14/2020 Creatinine 1.02, BUN 18, Potassium 4.2, Sodium 141, GFR >60  ?A complete set of results can be found in Results Review. ?  ?Recommendations:  No changes and encouraged to call if experiencing any fluid symptoms. ?  ?Follow-up plan: ICM clinic phone appointment on 08/22/2021.   91 day device clinic remote transmission 09/15/2021.   ?  ?EP/Cardiology Office Visits:   08/04/2021 with Oda Kilts, PA. ?  ?Copy of ICM check sent to Dr. Caryl Comes.  ? ?3 month ICM trend: 07/27/2021. ? ? ? ?12-14 Month ICM trend:  ? ? ? ?Rosalene Billings, RN ?07/27/2021 ?10:11 AM ? ?

## 2021-08-04 ENCOUNTER — Encounter: Payer: Medicare Other | Admitting: Student

## 2021-08-12 ENCOUNTER — Inpatient Hospital Stay: Payer: Medicare Other | Attending: Hematology & Oncology

## 2021-08-12 ENCOUNTER — Encounter: Payer: Self-pay | Admitting: Hematology & Oncology

## 2021-08-12 ENCOUNTER — Inpatient Hospital Stay (HOSPITAL_BASED_OUTPATIENT_CLINIC_OR_DEPARTMENT_OTHER): Payer: Medicare Other | Admitting: Hematology & Oncology

## 2021-08-12 ENCOUNTER — Other Ambulatory Visit: Payer: Self-pay

## 2021-08-12 VITALS — BP 117/64 | HR 79 | Resp 20 | Ht 70.0 in | Wt 257.1 lb

## 2021-08-12 DIAGNOSIS — Z7982 Long term (current) use of aspirin: Secondary | ICD-10-CM | POA: Insufficient documentation

## 2021-08-12 DIAGNOSIS — D45 Polycythemia vera: Secondary | ICD-10-CM | POA: Insufficient documentation

## 2021-08-12 DIAGNOSIS — I441 Atrioventricular block, second degree: Secondary | ICD-10-CM | POA: Diagnosis not present

## 2021-08-12 LAB — CBC WITH DIFFERENTIAL (CANCER CENTER ONLY)
Abs Immature Granulocytes: 0.07 10*3/uL (ref 0.00–0.07)
Basophils Absolute: 0.1 10*3/uL (ref 0.0–0.1)
Basophils Relative: 2 %
Eosinophils Absolute: 0.1 10*3/uL (ref 0.0–0.5)
Eosinophils Relative: 1 %
HCT: 40.1 % (ref 39.0–52.0)
Hemoglobin: 13.7 g/dL (ref 13.0–17.0)
Immature Granulocytes: 1 %
Lymphocytes Relative: 15 %
Lymphs Abs: 1.1 10*3/uL (ref 0.7–4.0)
MCH: 39.1 pg — ABNORMAL HIGH (ref 26.0–34.0)
MCHC: 34.2 g/dL (ref 30.0–36.0)
MCV: 114.6 fL — ABNORMAL HIGH (ref 80.0–100.0)
Monocytes Absolute: 0.7 10*3/uL (ref 0.1–1.0)
Monocytes Relative: 10 %
Neutro Abs: 5.1 10*3/uL (ref 1.7–7.7)
Neutrophils Relative %: 71 %
Platelet Count: 520 10*3/uL — ABNORMAL HIGH (ref 150–400)
RBC: 3.5 MIL/uL — ABNORMAL LOW (ref 4.22–5.81)
RDW: 12.8 % (ref 11.5–15.5)
WBC Count: 7.2 10*3/uL (ref 4.0–10.5)
nRBC: 0.3 % — ABNORMAL HIGH (ref 0.0–0.2)

## 2021-08-12 LAB — CMP (CANCER CENTER ONLY)
ALT: 10 U/L (ref 0–44)
AST: 15 U/L (ref 15–41)
Albumin: 4.2 g/dL (ref 3.5–5.0)
Alkaline Phosphatase: 56 U/L (ref 38–126)
Anion gap: 8 (ref 5–15)
BUN: 18 mg/dL (ref 8–23)
CO2: 25 mmol/L (ref 22–32)
Calcium: 9.1 mg/dL (ref 8.9–10.3)
Chloride: 105 mmol/L (ref 98–111)
Creatinine: 1.3 mg/dL — ABNORMAL HIGH (ref 0.61–1.24)
GFR, Estimated: 59 mL/min — ABNORMAL LOW (ref >60)
Glucose, Bld: 115 mg/dL — ABNORMAL HIGH (ref 70–99)
Potassium: 4.5 mmol/L (ref 3.5–5.1)
Sodium: 138 mmol/L (ref 135–145)
Total Bilirubin: 0.6 mg/dL (ref 0.3–1.2)
Total Protein: 6.8 g/dL (ref 6.5–8.1)

## 2021-08-12 LAB — LACTATE DEHYDROGENASE: LDH: 266 U/L — ABNORMAL HIGH (ref 98–192)

## 2021-08-12 LAB — SAVE SMEAR(SSMR), FOR PROVIDER SLIDE REVIEW

## 2021-08-12 NOTE — Progress Notes (Signed)
Hematology and Oncology Follow Up Visit ? ?Byran Sterkel ?[id number] ?1950/05/10 71 y.o. ?08/12/2021 ? ? ?Principle Diagnosis:  ?Polycythemia vera-JAK2 positive ?Heart block-Mobitz II ? ?Current Therapy:   ?Hydrea 500 mg p.o.TID -dose changed on 08/12/2021 ?Aspirin 81 mg by mouth daily ?Phlebotomy to maintain hematocrit below 45% ?    ?Interim History:  Mr.  Shahin is back for followup.  His wife is able to come in with him.  It is nice to see her again.  Is been quite a while since I last saw her.  She actually had cardiac stents put in earlier this week.  She is doing well with this. ? ?He is going to have RFA for some spinal nerves.  This so I think will be in a week or so. ? ?His platelet count is starting to go up a little bit more.  Unfortunately, we will have to increase his Hydrea dose to 3 times daily dosing. ? ?He has had no cardiac issues.  He has had no palpitations. ? ?His iron studies back in February showed a ferritin of 29 with an iron saturation of 26%. ? ?He has had no change in bowel or bladder habits. ? ?He may have a little bit of neuropathy in his feet. ? ?Overall, I would say his performance status is probably ECOG 1. ? ? ?Medications:  ?Current Outpatient Medications:  ?  allopurinol (ZYLOPRIM) 300 MG tablet, Take 300 mg by mouth daily., Disp: , Rfl:  ?  BD PEN NEEDLE NANO U/F 32G X 4 MM MISC, , Disp: , Rfl:  ?  budesonide (PULMICORT) 0.5 MG/2ML nebulizer solution, Generic for Pulmicort. Inhale one vial in nebulizer twice a day. Rinse mouth after use., Disp: 60 mL, Rfl: 10 ?  carvedilol (COREG) 12.5 MG tablet, TAKE 1 TABLET BY MOUTH  TWICE DAILY WITH MEALS, Disp: 180 tablet, Rfl: 3 ?  citalopram (CELEXA) 10 MG tablet, Take 5 mg by mouth daily. , Disp: , Rfl:  ?  colestipol (COLESTID) 1 g tablet, Take 1 tablet by mouth in the morning, at noon, and at bedtime., Disp: , Rfl:  ?  COVID-19 mRNA bivalent vaccine, Pfizer, (PFIZER COVID-19 VAC BIVALENT) injection, Inject into the muscle., Disp: 0.3  mL, Rfl: 0 ?  dicyclomine (BENTYL) 20 MG tablet, Take 20 mg by mouth 4 (four) times daily., Disp: , Rfl:  ?  folic acid (FOLVITE) 1 MG tablet, Take 1 mg by mouth daily., Disp: , Rfl:  ?  furosemide (LASIX) 40 MG tablet, Take 1/2 tab in the am and lunch Saturday through Wednesday, 1 tab in the morning and 1/2 tab at lunch on Thursday and Friday, Disp: 100 tablet, Rfl: 3 ?  hydroxyurea (HYDREA) 500 MG capsule, Take 2 capsules (1,000 mg total) by mouth daily. May take with food to minimize GI side effects. Currently taking 500 mg daily but MD note says may need to increase on 12/27/2020 to twice daily., Disp: 180 capsule, Rfl: 5 ?  insulin degludec (TRESIBA FLEXTOUCH) 100 UNIT/ML SOPN FlexTouch Pen, Inject 20 Units into the skin daily after breakfast. , Disp: , Rfl:  ?  ipratropium-albuterol (DUONEB) 0.5-2.5 (3) MG/3ML SOLN, 1 vial three times daily and every 6 hours as needed, Disp: 360 mL, Rfl: 2 ?  losartan (COZAAR) 25 MG tablet, Take 1 tablet (25 mg total) by mouth daily., Disp: 90 tablet, Rfl: 2 ?  Magnesium Oxide 400 MG CAPS, Take 2 capsules (800 mg total) by mouth in the morning and at bedtime., Disp: 90  capsule, Rfl: 1 ?  omeprazole (PRILOSEC) 20 MG capsule, Take 20 mg by mouth 2 (two) times daily before a meal. , Disp: , Rfl:  ?  oxybutynin (DITROPAN-XL) 10 MG 24 hr tablet, Take 10 mg by mouth daily., Disp: , Rfl:  ?  rosuvastatin (CRESTOR) 5 MG tablet, Take 5 mg by mouth daily., Disp: , Rfl:  ?  spironolactone (ALDACTONE) 25 MG tablet, TAKE ONE-HALF TABLET BY  MOUTH DAILY, Disp: 45 tablet, Rfl: 3 ?  tamsulosin (FLOMAX) 0.4 MG CAPS capsule, Take 0.4 mg by mouth 2 (two) times daily. , Disp: , Rfl:  ?No current facility-administered medications for this visit. ? ?Facility-Administered Medications Ordered in Other Visits:  ?  0.9 %  sodium chloride infusion, , Intravenous, Continuous, Kylor Valverde, Rudell Cobb, MD, Stopped at 05/16/13 1115 ? ?Allergies:  ?Allergies  ?Allergen Reactions  ? Bupropion Hives and Other (See  Comments)  ? Cephalexin Diarrhea and Other (See Comments)  ?  Caused C-diff, also ?  ? Fluoxetine Itching  ? Ibuprofen Itching  ? Prednisone Itching and Other (See Comments)  ?  Abdominal pain, also ?  ? Temazepam Other (See Comments)  ?  Dizziness   ? Trazodone And Nefazodone Other (See Comments)  ?  Dizziness  ? Fluoxetine Hcl Itching  ? Prozac [Fluoxetine Hcl] Itching  ? ? ?Past Medical History, Surgical history, Social history, and Family History were reviewed and updated. ? ?Review of Systems: ?Review of Systems  ?Constitutional: Negative.   ?HENT: Negative.    ?Eyes: Negative.   ?Respiratory:  Positive for shortness of breath.   ?Cardiovascular:  Positive for chest pain.  ?Gastrointestinal: Negative.   ?Genitourinary: Negative.   ?Musculoskeletal:  Positive for back pain.  ?Skin: Negative.   ?Neurological: Negative.   ?Endo/Heme/Allergies: Negative.   ?Psychiatric/Behavioral: Negative.    ? ?Physical Exam: ? height is 5' 10"  (1.778 m) and weight is 257 lb 1.6 oz (116.6 kg). His blood pressure is 117/64 and his pulse is 79. His respiration is 20 and oxygen saturation is 97%.  ? ?Physical Exam ?Vitals reviewed.  ?HENT:  ?   Head: Normocephalic and atraumatic.  ?Eyes:  ?   Pupils: Pupils are equal, round, and reactive to light.  ?Cardiovascular:  ?   Rate and Rhythm: Normal rate and regular rhythm.  ?   Heart sounds: Normal heart sounds.  ?Pulmonary:  ?   Effort: Pulmonary effort is normal.  ?   Breath sounds: Normal breath sounds.  ?Abdominal:  ?   General: Bowel sounds are normal.  ?   Palpations: Abdomen is soft.  ?Musculoskeletal:     ?   General: No tenderness or deformity. Normal range of motion.  ?   Cervical back: Normal range of motion.  ?Lymphadenopathy:  ?   Cervical: No cervical adenopathy.  ?Skin: ?   General: Skin is warm and dry.  ?   Findings: No erythema or rash.  ?Neurological:  ?   Mental Status: He is alert and oriented to person, place, and time.  ?Psychiatric:     ?   Behavior: Behavior  normal.     ?   Thought Content: Thought content normal.     ?   Judgment: Judgment normal.  ? ? ? ?Lab Results  ?Component Value Date  ? WBC 7.2 08/12/2021  ? HGB 13.7 08/12/2021  ? HCT 40.1 08/12/2021  ? MCV 114.6 (H) 08/12/2021  ? PLT 520 (H) 08/12/2021  ? ?  Chemistry   ?   ?  Component Value Date/Time  ? NA 137 07/08/2021 1049  ? NA 140 05/10/2020 0905  ? NA 141 04/10/2017 0923  ? NA 140 04/05/2016 0739  ? K 4.6 07/08/2021 1049  ? K 3.5 04/10/2017 0923  ? K 4.2 04/05/2016 0739  ? CL 107 07/08/2021 1049  ? CL 108 04/10/2017 0923  ? CO2 22 07/08/2021 1049  ? CO2 24 04/10/2017 0923  ? CO2 20 (L) 04/05/2016 0739  ? BUN 28 (H) 07/08/2021 1049  ? BUN 14 05/10/2020 0905  ? BUN 12 04/10/2017 0923  ? BUN 14.7 04/05/2016 0739  ? CREATININE 1.58 (H) 07/08/2021 1049  ? CREATININE 1.22 06/10/2021 0805  ? CREATININE 1.1 04/10/2017 0923  ? CREATININE 1.1 04/05/2016 0739  ?    ?Component Value Date/Time  ? CALCIUM 8.0 (L) 07/08/2021 1049  ? CALCIUM 8.4 04/10/2017 0923  ? CALCIUM 9.0 04/05/2016 0739  ? ALKPHOS 48 07/08/2021 1049  ? ALKPHOS 77 04/10/2017 0923  ? ALKPHOS 91 04/05/2016 0739  ? AST 19 07/08/2021 1049  ? AST 14 (L) 06/10/2021 0805  ? AST 34 04/05/2016 0739  ? ALT 10 07/08/2021 1049  ? ALT 9 06/10/2021 0805  ? ALT 32 04/10/2017 0923  ? ALT 40 04/05/2016 0739  ? BILITOT 0.9 07/08/2021 1049  ? BILITOT 0.6 06/10/2021 0805  ? BILITOT 0.48 04/05/2016 0739  ?  ? ? ?Impression and Plan: ?Mr. Compere is 71 year old gentleman with polycythemia vera.  Everything is doing okay from my point of view.  We have not had to phlebotomize him apply for good 5 years.  We just have to be careful with his platelet count.  He does have little more ecchymoses.  This might be from platelet dysfunction. ? ?Again we will increase his Hydrea to 500 mg p.o. 3 times daily. ? ?We will have to get him back to see [place] in another month or so.  Since we made a change in his Hydrea dose, we have to get her back a bit sooner to see how his platelet count  is responding.   ? ?Volanda Napoleon, MD ?4/7/20238:33 AM ? ?

## 2021-08-19 ENCOUNTER — Ambulatory Visit: Payer: Medicare Other | Admitting: Internal Medicine

## 2021-08-19 ENCOUNTER — Encounter: Payer: Self-pay | Admitting: Internal Medicine

## 2021-08-19 VITALS — BP 110/72 | HR 78 | Ht 70.0 in | Wt 259.0 lb

## 2021-08-19 DIAGNOSIS — I5042 Chronic combined systolic (congestive) and diastolic (congestive) heart failure: Secondary | ICD-10-CM | POA: Diagnosis not present

## 2021-08-19 DIAGNOSIS — Z950 Presence of cardiac pacemaker: Secondary | ICD-10-CM | POA: Diagnosis not present

## 2021-08-19 DIAGNOSIS — I428 Other cardiomyopathies: Secondary | ICD-10-CM | POA: Diagnosis not present

## 2021-08-19 MED ORDER — CARVEDILOL 3.125 MG PO TABS: 3.1250 mg | ORAL_TABLET | Freq: Two times a day (BID) | ORAL | 3 refills | Status: DC

## 2021-08-19 MED ORDER — LOSARTAN POTASSIUM 25 MG PO TABS: 12.5000 mg | ORAL_TABLET | Freq: Every day | ORAL | 3 refills | Status: DC

## 2021-08-19 NOTE — Progress Notes (Signed)
? ?Electrophysiology Office Note ? ? ?Date:  08/19/2021  ? ?IDMacauley Villanueva, DOB October 19, 1950, MRN [id number] ? ?PCP:  Antony Contras, MD  ?Cardiologist:  [facility] ?Primary Electrophysiologist:    [name], MD   ? ?No chief complaint on file. ? ?  ?History of Present Illness: ?William Villanueva is a 71 y.o. male is   seen in follow-up for CRT-P implantation for nonischemic cardiomyopathy. He had presented to 12/15 with symptomatic high-grade heart block. ?  ?Polycythemia vera, JAK2 positive followed by PE-MD; carries a diagnosis of cardiac hemochromatosis, although in cMRI there is no evidence of same. ? ?The patient denies chest pai, nocturnal dyspnea, orthopnea or peripheral edema.  There have been no palpitations, lightheadedness or syncope.  Complains of chronic mild shortness of breath.  He sleeps very poorly.  Cannot quiet his mind.. ? ? ?  ?DATE TEST EF   ?9/13 cMRI  34% LGE neg  ?2/16 Myoview  35%   ?3/17 Myoview    32 %   ?1/20 Echo  40-45%   ?9/21 Echo  30-35%   ?2/22 Echo  45-50%  Following ECG ?resynchronization  ? ? ? ?Date Cr K Hgb BNP  ?9/19 1.44 4.1 13.7   ?9/21  1.56 3.8 15.2 2864  ?4/23 1.3 4.5 13.7   ? ? ? ?Past Medical History:  ?Diagnosis Date  ? AV block, 2nd degree 2015  ? Sebastian pulse generator [id number], model PM 3242  ? Back pain   ? Cardiomyopathy ([place])   ? Nonischemic 45%.   ? CHF (congestive heart failure) ([place])   ? Gout   ? Hemochromatosis   ? Hypertension   ? Hypospadias 01-10-1951  ? born with  ? Nephrolithiasis   ? Polycythemia vera(238.4)   ? ?Past Surgical History:  ?Procedure Laterality Date  ? ANKLE SURGERY    ? BI-VENTRICULAR PACEMAKER INSERTION N/A 04/29/2014  ? Procedure: BI-VENTRICULAR PACEMAKER INSERTION (CRT-P);  Surgeon: Deboraha Sprang, MD; Laterality: Left  St. Jude Allure Quadra pulse generator [phone], model PM [phone]  ? CARDIAC SURGERY    ? COLONOSCOPY N/A 02/15/2020  ? Procedure: COLONOSCOPY;  Surgeon: Ronnette Juniper, MD;  Location: [place];  Service:  Gastroenterology;  Laterality: N/A;  ? ESOPHAGOGASTRODUODENOSCOPY (EGD) WITH PROPOFOL N/A 02/15/2020  ? Procedure: ESOPHAGOGASTRODUODENOSCOPY (EGD) WITH PROPOFOL;  Surgeon: Ronnette Juniper, MD;  Location: [place];  Service: Gastroenterology;  Laterality: N/A;  ? PILONIDAL CYST EXCISION    ? POSTERIOR LAMINECTOMY / DECOMPRESSION LUMBAR SPINE    ? TONSILLECTOMY    ? ? ? ?Current Outpatient Medications  ?Medication Sig Dispense Refill  ? allopurinol (ZYLOPRIM) 300 MG tablet Take 300 mg by mouth daily.    ? BD PEN NEEDLE NANO U/F 32G X 4 MM MISC     ? carvedilol (COREG) 12.5 MG tablet TAKE 1 TABLET BY MOUTH  TWICE DAILY WITH MEALS 180 tablet 3  ? citalopram (CELEXA) 10 MG tablet Take 5 mg by mouth daily.     ? colestipol (COLESTID) 1 g tablet Take 1 tablet by mouth in the morning, at noon, and at bedtime.    ? COVID-19 mRNA bivalent vaccine, Pfizer, (PFIZER COVID-19 VAC BIVALENT) injection Inject into the muscle. 0.3 mL 0  ? dicyclomine (BENTYL) 20 MG tablet Take 20 mg by mouth 4 (four) times daily.    ? folic acid (FOLVITE) 1 MG tablet Take 1 mg by mouth daily.    ? furosemide (LASIX) 40 MG tablet Take 1/2 tab in the  am and lunch Saturday through Wednesday, 1 tab in the morning and 1/2 tab at lunch on Thursday and Friday 100 tablet 3  ? hydroxyurea (HYDREA) 500 MG capsule Take 2 capsules (1,000 mg total) by mouth daily. May take with food to minimize GI side effects. Currently taking 500 mg daily but MD note says may need to increase on 12/27/2020 to twice daily. 180 capsule 5  ? insulin degludec (TRESIBA FLEXTOUCH) 100 UNIT/ML SOPN FlexTouch Pen Inject 20 Units into the skin daily after breakfast.     ? ipratropium-albuterol (DUONEB) 0.5-2.5 (3) MG/3ML SOLN 1 vial three times daily and every 6 hours as needed 360 mL 2  ? losartan (COZAAR) 25 MG tablet Take 1 tablet (25 mg total) by mouth daily. 90 tablet 2  ? Magnesium Oxide 400 MG CAPS Take 2 capsules (800 mg total) by mouth in the morning and at bedtime. 90 capsule  1  ? omeprazole (PRILOSEC) 20 MG capsule Take 20 mg by mouth 2 (two) times daily before a meal.     ? oxybutynin (DITROPAN-XL) 10 MG 24 hr tablet Take 10 mg by mouth daily.    ? rosuvastatin (CRESTOR) 5 MG tablet Take 5 mg by mouth daily.    ? spironolactone (ALDACTONE) 25 MG tablet TAKE ONE-HALF TABLET BY  MOUTH DAILY 45 tablet 3  ? tamsulosin (FLOMAX) 0.4 MG CAPS capsule Take 0.4 mg by mouth 2 (two) times daily.     ? budesonide (PULMICORT) 0.5 MG/2ML nebulizer solution Generic for Pulmicort. Inhale one vial in nebulizer twice a day. Rinse mouth after use. (Patient not taking: Reported on 08/19/2021) 60 mL 10  ? ?No current facility-administered medications for this visit.  ? ?Facility-Administered Medications Ordered in Other Visits  ?Medication Dose Route Frequency Provider Last Rate Last Admin  ? 0.9 %  sodium chloride infusion   Intravenous Continuous Volanda Napoleon, MD   Stopped at 05/16/13 1115  ? ? ?Allergies:   Bupropion, Cephalexin, Fluoxetine, Ibuprofen, Prednisone, Temazepam, Trazodone and nefazodone, Fluoxetine hcl, and Prozac [fluoxetine hcl]  ?  ? ?PHYSICAL EXAM: ?VS:  BP 110/72   Pulse 78   Ht 5' 10"  (1.778 m)   Wt 259 lb (117.5 kg)   SpO2 93%   BMI 37.16 kg/m?  , BMI Body mass index is 37.16 kg/m?. ?Well developed and well nourished in no acute distress ?HENT normal ?Neck supple   ?Clear ?Device pocket well healed; without hematoma or erythema.  There is no tethering  ?Regular rate and rhythm, no  gallop No murmur ?Abd-soft with active BS ?No Clubbing cyanosis  edema ?Skin-warm and dry ?A & Oriented  Grossly normal sensory and motor function ? ?ECG sinus rhythm with P synchronous pacing with an upright QRS lead V1 and a negative QRS lead I ? ?  ?Device interrogation is reviewed today in detail.  See PaceArt for details.  ?  ? ?ASSESSMENT AND PLAN: ? ?NICM ? ?CHF chronic sys/HFpFF ? ?Hypertension ? ?Morbidly obese ?  ?Pacemaker-CRT-St. Jude   ?  ?Orthostatic hypotension  ? ?He remains  significantly orthostatic with a nadir blood pressure of 73.  We will further decrease his carvedilol to 3.125 twice daily and have him take his losartan at night and decrease it from 25--12.5. ? ?He is euvolemic today; we will continue him on furosemide 20 mg twice daily 5 days and 40/20 2  days.  He might well be a candidate also for an SGLT2; will defer this to Dr. Cherylann Ratel ? ?Device  function is normal ? ?Systolic blood pressure is well controlled. ? ?Suggested also he talk with his PCP regarding something for sleep ? ? ?  ? ?Signed, ?[name], MD  ?08/19/2021 8:33 AM    ? ?[facility] ?[address] ?[address] ?[place] [address] ?[phone] (office) ?[phone] (fax) ?

## 2021-08-19 NOTE — Patient Instructions (Signed)
Medication Instructions:  ?Your physician has recommended you make the following change in your medication:  ? ?** Begin Carvedilol 3.131m - 1 tablet by mouth twice daily. ? ?** Begin Losartan 241m- 1/2 tablet by mouth daily at night. ? ? ?*If you need a refill on your cardiac medications before your next appointment, please call your pharmacy* ? ? ?Lab Work: ?None ordered. ? ?If you have labs (blood work) drawn today and your tests are completely normal, you will receive your results only by: ?MyChart Message (if you have MyChart) OR ?A paper copy in the mail ?If you have any lab test that is abnormal or we need to change your treatment, we will call you to review the results. ? ? ?Testing/Procedures: ?None ordered. ? ? ? ?Follow-Up: ?[facility].  As part of our continuing mission to provide you with exceptional heart care, we have created designated Provider Care Teams.  These Care Teams include your primary Cardiologist (physician) and Advanced Practice Providers (APPs -  Physician Assistants and Nurse Practitioners) who all work together to provide you with the care you need, when you need it. ? ?We recommend signing up for the patient portal called "MyChart".  Sign up information is provided on this After Visit Summary.  MyChart is used to connect with patients for Virtual Visits (Telemedicine).  Patients are able to view lab/test results, encounter notes, upcoming appointments, etc.  Non-urgent messages can be sent to your provider as well.   ?To learn more about what you can do with MyChart, go to [email or web address]  ? ?Your next appointment:   ?12 months with AnOda KiltsPA-C ? ?Important Information About Sugar ? ? ? ? ?  ?

## 2021-08-23 ENCOUNTER — Ambulatory Visit (INDEPENDENT_AMBULATORY_CARE_PROVIDER_SITE_OTHER): Payer: Medicare Other

## 2021-08-23 DIAGNOSIS — I5042 Chronic combined systolic (congestive) and diastolic (congestive) heart failure: Secondary | ICD-10-CM | POA: Diagnosis not present

## 2021-08-23 DIAGNOSIS — Z950 Presence of cardiac pacemaker: Secondary | ICD-10-CM

## 2021-08-23 NOTE — Progress Notes (Signed)
EPIC Encounter for ICM Monitoring ? ?Patient Name: William Villanueva is a 71 y.o. male ?Date: 08/23/2021 ?Primary Care Physican: Antony Contras, MD ?Electrophysiologist: Caryl Comes ?Bi-V Pacing: 95%            ?06/15/2021 Weight: 255 lbs ?07/27/2021 Weight: 250-255 lbs ?08/23/2021 Weight: 255 lbs ?  ?Battery Longevity: 6.3 months ?  ?AT/AF Burden <1% ?                                                         ?  ?Spoke with patient and heart failure questions reviewed.  He is feeling fine at this time. ?       ?      CorVue thoracic impedance suggesting normal fluid levels. ?  ?Prescribed:   ?Furosemide 40 mg Take 1/2 tab in the am and lunch Saturday through Wednesday, 1 tab (40 mg total) in the morning and 1/2 tab (20 mg total) at lunch on Thursday and Friday  ?Spironolactone 25 mg take 0.5 tablet (12.5 mg total) once a day ?  ?Labs: ?08/12/2021 Creatinine 1.30, BUN 18, Potassium 4.5, Sodium 138, GFR 59 ?07/08/2021 Creatinine 1.58, BUN 28, Potassium 4.6, Sodium 137, GFR 47 ?06/10/2021 Creatinine 1.22, BUN 20, Potassium 4.5, Sodium 139, GFR >60 ?A complete set of results can be found in Results Review. ?  ?Recommendations:  No changes and encouraged to call if experiencing any fluid symptoms. ?  ?Follow-up plan: ICM clinic phone appointment on 09/26/2021.   91 day device clinic remote transmission 09/15/2021.   ?  ?EP/Cardiology Office Visits:   None ?  ?Copy of ICM check sent to Dr. Caryl Comes.  ? ?3 month ICM trend: 08/22/2021. ? ? ? ?12-14 Month ICM trend:  ? ? ? ?Rosalene Billings, RN ?08/23/2021 ?4:27 PM ? ?

## 2021-08-25 LAB — CUP PACEART REMOTE DEVICE CHECK
Battery Remaining Longevity: 6 mo
Battery Remaining Percentage: 7 %
Battery Voltage: 2.81 V
Brady Statistic AP VP Percent: 3.8 %
Brady Statistic AP VS Percent: 1 %
Brady Statistic AS VP Percent: 92 %
Brady Statistic AS VS Percent: 1.3 %
Brady Statistic RA Percent Paced: 1.4 %
Date Time Interrogation Session: 20230417044531
Implantable Lead Implant Date: 20151223
Implantable Lead Implant Date: 20151223
Implantable Lead Implant Date: 20151223
Implantable Lead Location: 753858
Implantable Lead Location: 753859
Implantable Lead Location: 753860
Implantable Pulse Generator Implant Date: 20151223
Lead Channel Impedance Value: 430 Ohm
Lead Channel Impedance Value: 430 Ohm
Lead Channel Impedance Value: 580 Ohm
Lead Channel Pacing Threshold Amplitude: 1.125 V
Lead Channel Pacing Threshold Amplitude: 1.25 V
Lead Channel Pacing Threshold Amplitude: 1.625 V
Lead Channel Pacing Threshold Pulse Width: 0.4 ms
Lead Channel Pacing Threshold Pulse Width: 0.6 ms
Lead Channel Pacing Threshold Pulse Width: 0.7 ms
Lead Channel Sensing Intrinsic Amplitude: 12 mV
Lead Channel Sensing Intrinsic Amplitude: 2.8 mV
Lead Channel Setting Pacing Amplitude: 2.125
Lead Channel Setting Pacing Amplitude: 2.5 V
Lead Channel Setting Pacing Amplitude: 2.625
Lead Channel Setting Pacing Pulse Width: 0.4 ms
Lead Channel Setting Pacing Pulse Width: 0.7 ms
Lead Channel Setting Sensing Sensitivity: 2 mV
Pulse Gen Model: 3242
Pulse Gen Serial Number: 7701275

## 2021-09-16 ENCOUNTER — Inpatient Hospital Stay: Payer: Medicare Other | Attending: Hematology & Oncology

## 2021-09-16 ENCOUNTER — Inpatient Hospital Stay: Payer: Medicare Other | Admitting: Hematology & Oncology

## 2021-09-16 ENCOUNTER — Other Ambulatory Visit: Payer: Self-pay

## 2021-09-16 ENCOUNTER — Encounter: Payer: Self-pay | Admitting: Hematology & Oncology

## 2021-09-16 VITALS — BP 113/57 | HR 86 | Temp 97.5°F | Resp 19 | Wt 265.0 lb

## 2021-09-16 DIAGNOSIS — D45 Polycythemia vera: Secondary | ICD-10-CM | POA: Diagnosis not present

## 2021-09-16 DIAGNOSIS — Z79899 Other long term (current) drug therapy: Secondary | ICD-10-CM | POA: Diagnosis not present

## 2021-09-16 DIAGNOSIS — Z950 Presence of cardiac pacemaker: Secondary | ICD-10-CM | POA: Diagnosis not present

## 2021-09-16 LAB — CMP (CANCER CENTER ONLY)
ALT: 10 U/L (ref 0–44)
AST: 17 U/L (ref 15–41)
Albumin: 4 g/dL (ref 3.5–5.0)
Alkaline Phosphatase: 53 U/L (ref 38–126)
Anion gap: 11 (ref 5–15)
BUN: 17 mg/dL (ref 8–23)
CO2: 23 mmol/L (ref 22–32)
Calcium: 8.8 mg/dL — ABNORMAL LOW (ref 8.9–10.3)
Chloride: 105 mmol/L (ref 98–111)
Creatinine: 1.1 mg/dL (ref 0.61–1.24)
GFR, Estimated: >60 mL/min (ref >60)
Glucose, Bld: 139 mg/dL — ABNORMAL HIGH (ref 70–99)
Potassium: 4.3 mmol/L (ref 3.5–5.1)
Sodium: 139 mmol/L (ref 135–145)
Total Bilirubin: 0.8 mg/dL (ref 0.3–1.2)
Total Protein: 6.4 g/dL — ABNORMAL LOW (ref 6.5–8.1)

## 2021-09-16 LAB — CBC WITH DIFFERENTIAL (CANCER CENTER ONLY)
Abs Immature Granulocytes: 0.05 10*3/uL (ref 0.00–0.07)
Basophils Absolute: 0.1 10*3/uL (ref 0.0–0.1)
Basophils Relative: 2 %
Eosinophils Absolute: 0 10*3/uL (ref 0.0–0.5)
Eosinophils Relative: 1 %
HCT: 37.6 % — ABNORMAL LOW (ref 39.0–52.0)
Hemoglobin: 12.7 g/dL — ABNORMAL LOW (ref 13.0–17.0)
Immature Granulocytes: 1 %
Lymphocytes Relative: 20 %
Lymphs Abs: 0.8 10*3/uL (ref 0.7–4.0)
MCH: 39 pg — ABNORMAL HIGH (ref 26.0–34.0)
MCHC: 33.8 g/dL (ref 30.0–36.0)
MCV: 115.3 fL — ABNORMAL HIGH (ref 80.0–100.0)
Monocytes Absolute: 0.4 10*3/uL (ref 0.1–1.0)
Monocytes Relative: 9 %
Neutro Abs: 2.9 10*3/uL (ref 1.7–7.7)
Neutrophils Relative %: 67 %
Platelet Count: 384 10*3/uL (ref 150–400)
RBC: 3.26 MIL/uL — ABNORMAL LOW (ref 4.22–5.81)
RDW: 16.1 % — ABNORMAL HIGH (ref 11.5–15.5)
WBC Count: 4.2 10*3/uL (ref 4.0–10.5)
nRBC: 0 % (ref 0.0–0.2)

## 2021-09-16 LAB — IRON AND IRON BINDING CAPACITY (CC-WL,HP ONLY)
Iron: 137 ug/dL (ref 45–182)
Saturation Ratios: 33 % (ref 17.9–39.5)
TIBC: 416 ug/dL (ref 250–450)
UIBC: 279 ug/dL (ref 117–376)

## 2021-09-16 LAB — FERRITIN: Ferritin: 18 ng/mL — ABNORMAL LOW (ref 24–336)

## 2021-09-16 LAB — LACTATE DEHYDROGENASE: LDH: 244 U/L — ABNORMAL HIGH (ref 98–192)

## 2021-09-16 NOTE — Progress Notes (Signed)
Hematology and Oncology Follow Up Visit ? ?Cian Awwad ?[id number] ?1951/02/07 71 y.o. ?09/16/2021 ? ? ?Principle Diagnosis:  ?Polycythemia vera-JAK2 positive ?Heart block-Mobitz II ? ?Current Therapy:   ?Hydrea 500 mg p.o.TID -dose changed on 08/12/2021 ?Aspirin 81 mg by mouth daily ?Phlebotomy to maintain hematocrit below 45% ?    ?Interim History:  Mr.  Bethel is back for followup.  He is looking quite good.  We increase his Hydrea dose last time that we saw him.  His platelet counts come down quite nicely. ? ?He has had no problems with his heart.  His pacemaker will last another 5 months and then he needs to have another pacemaker put in. ? ?He has had the radiofrequency ablation to the spinal nerves in his lower back.  This is helped a little bit. ? ?He has had no problems with bleeding.  He has had no nausea or vomiting.  He has had no rashes.  There is been no cough or shortness of breath. ? ?He is still working.  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 ? ?He and his wife are quite busy helping with the great grandchildren. ? ?His wife is doing better.  She had the cardiac stents put in a few weeks ago. ? ?Overall, his performance status is ECOG 1.   ? ? ? ?Medications:  ?Current Outpatient Medications:  ?  allopurinol (ZYLOPRIM) 300 MG tablet, Take 300 mg by mouth daily., Disp: , Rfl:  ?  BD PEN NEEDLE NANO U/F 32G X 4 MM MISC, , Disp: , Rfl:  ?  budesonide (PULMICORT) 0.5 MG/2ML nebulizer solution, Generic for Pulmicort. Inhale one vial in nebulizer twice a day. Rinse mouth after use. (Patient not taking: Reported on 08/19/2021), Disp: 60 mL, Rfl: 10 ?  carvedilol (COREG) 3.125 MG tablet, Take 1 tablet (3.125 mg total) by mouth 2 (two) times daily with a meal., Disp: 180 tablet, Rfl: 3 ?  citalopram (CELEXA) 10 MG tablet, Take 5 mg by mouth daily. , Disp: , Rfl:  ?  colestipol (COLESTID) 1 g tablet, Take 1 tablet by mouth in the morning, at noon, and at bedtime., Disp: , Rfl:  ?  COVID-19 mRNA  bivalent vaccine, Pfizer, (PFIZER COVID-19 VAC BIVALENT) injection, Inject into the muscle., Disp: 0.3 mL, Rfl: 0 ?  dicyclomine (BENTYL) 20 MG tablet, Take 20 mg by mouth 4 (four) times daily., Disp: , Rfl:  ?  folic acid (FOLVITE) 1 MG tablet, Take 1 mg by mouth daily., Disp: , Rfl:  ?  furosemide (LASIX) 40 MG tablet, Take 1/2 tab in the am and lunch Saturday through Wednesday, 1 tab in the morning and 1/2 tab at lunch on Thursday and Friday, Disp: 100 tablet, Rfl: 3 ?  hydroxyurea (HYDREA) 500 MG capsule, Take 2 capsules (1,000 mg total) by mouth daily. May take with food to minimize GI side effects. Currently taking 500 mg daily but MD note says may need to increase on 12/27/2020 to twice daily., Disp: 180 capsule, Rfl: 5 ?  insulin degludec (TRESIBA FLEXTOUCH) 100 UNIT/ML SOPN FlexTouch Pen, Inject 20 Units into the skin daily after breakfast. , Disp: , Rfl:  ?  ipratropium-albuterol (DUONEB) 0.5-2.5 (3) MG/3ML SOLN, 1 vial three times daily and every 6 hours as needed, Disp: 360 mL, Rfl: 2 ?  losartan (COZAAR) 25 MG tablet, Take 0.5 tablets (12.5 mg total) by mouth daily. Take at night., Disp: 45 tablet, Rfl: 3 ?  Magnesium Oxide 400 MG CAPS, Take 2  capsules (800 mg total) by mouth in the morning and at bedtime., Disp: 90 capsule, Rfl: 1 ?  omeprazole (PRILOSEC) 20 MG capsule, Take 20 mg by mouth 2 (two) times daily before a meal. , Disp: , Rfl:  ?  oxybutynin (DITROPAN-XL) 10 MG 24 hr tablet, Take 10 mg by mouth daily., Disp: , Rfl:  ?  rosuvastatin (CRESTOR) 5 MG tablet, Take 5 mg by mouth daily., Disp: , Rfl:  ?  spironolactone (ALDACTONE) 25 MG tablet, TAKE ONE-HALF TABLET BY  MOUTH DAILY, Disp: 45 tablet, Rfl: 3 ?  tamsulosin (FLOMAX) 0.4 MG CAPS capsule, Take 0.4 mg by mouth 2 (two) times daily. , Disp: , Rfl:  ?No current facility-administered medications for this visit. ? ?Facility-Administered Medications Ordered in Other Visits:  ?  0.9 %  sodium chloride infusion, , Intravenous, Continuous, Lizmarie Witters,  Rudell Cobb, MD, Stopped at 05/16/13 1115 ? ?Allergies:  ?Allergies  ?Allergen Reactions  ? Bupropion Hives and Other (See Comments)  ? Cephalexin Diarrhea and Other (See Comments)  ?  Caused C-diff, also ?  ? Fluoxetine Itching  ? Ibuprofen Itching  ? Prednisone Itching and Other (See Comments)  ?  Abdominal pain, also ?  ? Temazepam Other (See Comments)  ?  Dizziness   ? Trazodone And Nefazodone Other (See Comments)  ?  Dizziness  ? Fluoxetine Hcl Itching  ? Prozac [Fluoxetine Hcl] Itching  ? ? ?Past Medical History, Surgical history, Social history, and Family History were reviewed and updated. ? ?Review of Systems: ?Review of Systems  ?Constitutional: Negative.   ?HENT: Negative.    ?Eyes: Negative.   ?Respiratory:  Positive for shortness of breath.   ?Cardiovascular:  Positive for chest pain.  ?Gastrointestinal: Negative.   ?Genitourinary: Negative.   ?Musculoskeletal:  Positive for back pain.  ?Skin: Negative.   ?Neurological: Negative.   ?Endo/Heme/Allergies: Negative.   ?Psychiatric/Behavioral: Negative.    ? ?Physical Exam: ? weight is 265 lb (120.2 kg). His oral temperature is 97.5 ?F (36.4 ?C) (abnormal). His blood pressure is 113/57 (abnormal) and his pulse is 86. His respiration is 19 and oxygen saturation is 98%.  ? ?Physical Exam ?Vitals reviewed.  ?HENT:  ?   Head: Normocephalic and atraumatic.  ?Eyes:  ?   Pupils: Pupils are equal, round, and reactive to light.  ?Cardiovascular:  ?   Rate and Rhythm: Normal rate and regular rhythm.  ?   Heart sounds: Normal heart sounds.  ?Pulmonary:  ?   Effort: Pulmonary effort is normal.  ?   Breath sounds: Normal breath sounds.  ?Abdominal:  ?   General: Bowel sounds are normal.  ?   Palpations: Abdomen is soft.  ?Musculoskeletal:     ?   General: No tenderness or deformity. Normal range of motion.  ?   Cervical back: Normal range of motion.  ?Lymphadenopathy:  ?   Cervical: No cervical adenopathy.  ?Skin: ?   General: Skin is warm and dry.  ?   Findings: No  erythema or rash.  ?Neurological:  ?   Mental Status: He is alert and oriented to person, place, and time.  ?Psychiatric:     ?   Behavior: Behavior normal.     ?   Thought Content: Thought content normal.     ?   Judgment: Judgment normal.  ? ? ? ?Lab Results  ?Component Value Date  ? WBC 4.2 09/16/2021  ? HGB 12.7 (L) 09/16/2021  ? HCT 37.6 (L) 09/16/2021  ? MCV 115.3 (  H) 09/16/2021  ? PLT 384 09/16/2021  ? ?  Chemistry   ?   ?Component Value Date/Time  ? NA 139 09/16/2021 0803  ? NA 140 05/10/2020 0905  ? NA 141 04/10/2017 0923  ? NA 140 04/05/2016 0739  ? K 4.3 09/16/2021 0803  ? K 3.5 04/10/2017 0923  ? K 4.2 04/05/2016 0739  ? CL 105 09/16/2021 0803  ? CL 108 04/10/2017 0923  ? CO2 23 09/16/2021 0803  ? CO2 24 04/10/2017 0923  ? CO2 20 (L) 04/05/2016 0739  ? BUN 17 09/16/2021 0803  ? BUN 14 05/10/2020 0905  ? BUN 12 04/10/2017 0923  ? BUN 14.7 04/05/2016 0739  ? CREATININE 1.10 09/16/2021 0803  ? CREATININE 1.1 04/10/2017 0923  ? CREATININE 1.1 04/05/2016 0739  ?    ?Component Value Date/Time  ? CALCIUM 8.8 (L) 09/16/2021 0803  ? CALCIUM 8.4 04/10/2017 0923  ? CALCIUM 9.0 04/05/2016 0739  ? ALKPHOS 53 09/16/2021 0803  ? ALKPHOS 77 04/10/2017 0923  ? ALKPHOS 91 04/05/2016 0739  ? AST 17 09/16/2021 0803  ? AST 34 04/05/2016 0739  ? ALT 10 09/16/2021 0803  ? ALT 32 04/10/2017 0923  ? ALT 40 04/05/2016 0739  ? BILITOT 0.8 09/16/2021 0803  ? BILITOT 0.48 04/05/2016 0739  ?  ? ? ?Impression and Plan: ?Mr. Weninger is 71 year old gentleman with polycythemia vera.  Everything is doing okay from my point of view.  We have not had to phlebotomize him apply for good 5 years.   ? ?His platelet count is coming down nicely.  I will keep him on the 500 mg p.o. 3 times daily dose of the Hydrea. ? ?I hope his wife will continue to improve after the cardiac stents were put in. ? ?I am glad that his back is doing a little bit better after the ablation. ? ?We will plan to get him back in a couple months now.   ? ? ?Volanda Napoleon, MD ?5/12/20238:46 AM ? ?

## 2021-09-19 ENCOUNTER — Ambulatory Visit (INDEPENDENT_AMBULATORY_CARE_PROVIDER_SITE_OTHER): Payer: Medicare Other

## 2021-09-19 DIAGNOSIS — I428 Other cardiomyopathies: Secondary | ICD-10-CM

## 2021-09-20 LAB — CUP PACEART REMOTE DEVICE CHECK
Battery Remaining Longevity: 6 mo
Battery Remaining Percentage: 6 %
Battery Voltage: 2.8 V
Brady Statistic AP VP Percent: 4.8 %
Brady Statistic AP VS Percent: 1 %
Brady Statistic AS VP Percent: 91 %
Brady Statistic AS VS Percent: 1.4 %
Brady Statistic RA Percent Paced: 2.6 %
Date Time Interrogation Session: 20230515045821
Implantable Lead Implant Date: 20151223
Implantable Lead Implant Date: 20151223
Implantable Lead Implant Date: 20151223
Implantable Lead Location: 753858
Implantable Lead Location: 753859
Implantable Lead Location: 753860
Implantable Pulse Generator Implant Date: 20151223
Lead Channel Impedance Value: 410 Ohm
Lead Channel Impedance Value: 410 Ohm
Lead Channel Impedance Value: 610 Ohm
Lead Channel Pacing Threshold Amplitude: 1.125 V
Lead Channel Pacing Threshold Amplitude: 1.25 V
Lead Channel Pacing Threshold Amplitude: 1.5 V
Lead Channel Pacing Threshold Pulse Width: 0.4 ms
Lead Channel Pacing Threshold Pulse Width: 0.6 ms
Lead Channel Pacing Threshold Pulse Width: 0.7 ms
Lead Channel Sensing Intrinsic Amplitude: 2.3 mV
Lead Channel Sensing Intrinsic Amplitude: 6.9 mV
Lead Channel Setting Pacing Amplitude: 2.125
Lead Channel Setting Pacing Amplitude: 2.5 V
Lead Channel Setting Pacing Amplitude: 2.5 V
Lead Channel Setting Pacing Pulse Width: 0.4 ms
Lead Channel Setting Pacing Pulse Width: 0.7 ms
Lead Channel Setting Sensing Sensitivity: 2 mV
Pulse Gen Model: 3242
Pulse Gen Serial Number: 7701275

## 2021-09-26 ENCOUNTER — Ambulatory Visit (INDEPENDENT_AMBULATORY_CARE_PROVIDER_SITE_OTHER): Payer: Medicare Other

## 2021-09-26 DIAGNOSIS — I5042 Chronic combined systolic (congestive) and diastolic (congestive) heart failure: Secondary | ICD-10-CM

## 2021-09-26 DIAGNOSIS — Z950 Presence of cardiac pacemaker: Secondary | ICD-10-CM

## 2021-09-27 DIAGNOSIS — N1831 Chronic kidney disease, stage 3a: Secondary | ICD-10-CM | POA: Diagnosis not present

## 2021-09-27 DIAGNOSIS — E1122 Type 2 diabetes mellitus with diabetic chronic kidney disease: Secondary | ICD-10-CM | POA: Diagnosis not present

## 2021-09-27 DIAGNOSIS — I119 Hypertensive heart disease without heart failure: Secondary | ICD-10-CM | POA: Diagnosis not present

## 2021-09-27 DIAGNOSIS — E7800 Pure hypercholesterolemia, unspecified: Secondary | ICD-10-CM | POA: Diagnosis not present

## 2021-09-27 DIAGNOSIS — K219 Gastro-esophageal reflux disease without esophagitis: Secondary | ICD-10-CM | POA: Diagnosis not present

## 2021-09-27 DIAGNOSIS — I5043 Acute on chronic combined systolic (congestive) and diastolic (congestive) heart failure: Secondary | ICD-10-CM | POA: Diagnosis not present

## 2021-09-27 NOTE — Progress Notes (Signed)
EPIC Encounter for ICM Monitoring  Patient Name: William Villanueva is a 71 y.o. male Date: 09/27/2021 Primary Care Physican: Antony Contras, MD Electrophysiologist: Vergie Living Pacing: 96%            06/15/2021 Weight: 255 lbs 07/27/2021 Weight: 250-255 lbs 08/23/2021 Weight: 255 lbs 09/27/2021 Weight: 261 lbs   Battery Longevity: 5.6 months   AT/AF Burden <1%                                                            Spoke with patient and heart failure questions reviewed.  Pt asymptomatic for fluid accumulation.  Gained weight but thinks it is due to caloric weight.              CorVue thoracic impedance normal but was suggesting possible fluid accumulation from 4/29-5/16.   Prescribed:   Furosemide 40 mg Take 1/2 tab in the am and lunch Saturday through Wednesday, 1 tab (40 mg total) in the morning and 1/2 tab (20 mg total) at lunch on Thursday and Friday  Spironolactone 25 mg take 0.5 tablet (12.5 mg total) once a day   Labs: 09/16/2021 Creatinine 1.10, BUN 17, Potassium 4.3, Sodium 139, GFR >60 08/12/2021 Creatinine 1.30, BUN 18, Potassium 4.5, Sodium 138, GFR 59 07/08/2021 Creatinine 1.58, BUN 28, Potassium 4.6, Sodium 137, GFR 47 06/10/2021 Creatinine 1.22, BUN 20, Potassium 4.5, Sodium 139, GFR >60 A complete set of results can be found in Results Review.   Recommendations:  No changes and encouraged to call if experiencing any fluid symptoms.   Follow-up plan: ICM clinic phone appointment on 10/31/2021.   91 day device clinic remote transmission 11/21/2021.     EP/Cardiology Office Visits:   Recall 11/29/2021 with Dr Stanford Breed.  Recall 08/14/2022 with Oda Kilts, PA   Copy of ICM check sent to Dr. Caryl Comes.   3 month ICM trend: 09/26/2021.    12-14 Month ICM trend:     Rosalene Billings, RN 09/27/2021 9:09 AM

## 2021-10-06 NOTE — Addendum Note (Signed)
Addended by: Cheri Kearns A on: 10/06/2021 01:16 PM   Modules accepted: Level of Service

## 2021-10-06 NOTE — Progress Notes (Signed)
Remote pacemaker transmission.   

## 2021-10-20 ENCOUNTER — Ambulatory Visit (INDEPENDENT_AMBULATORY_CARE_PROVIDER_SITE_OTHER): Payer: Medicare Other

## 2021-10-20 DIAGNOSIS — I441 Atrioventricular block, second degree: Secondary | ICD-10-CM

## 2021-10-22 LAB — CUP PACEART REMOTE DEVICE CHECK
Battery Remaining Longevity: 5 mo
Battery Remaining Percentage: 6 %
Battery Voltage: 2.78 V
Brady Statistic AP VP Percent: 4.2 %
Brady Statistic AP VS Percent: 1 %
Brady Statistic AS VP Percent: 92 %
Brady Statistic AS VS Percent: 1.3 %
Brady Statistic RA Percent Paced: 2.5 %
Date Time Interrogation Session: 20230616125929
Implantable Lead Implant Date: 20151223
Implantable Lead Implant Date: 20151223
Implantable Lead Implant Date: 20151223
Implantable Lead Location: 753858
Implantable Lead Location: 753859
Implantable Lead Location: 753860
Implantable Pulse Generator Implant Date: 20151223
Lead Channel Impedance Value: 400 Ohm
Lead Channel Impedance Value: 440 Ohm
Lead Channel Impedance Value: 650 Ohm
Lead Channel Pacing Threshold Amplitude: 1 V
Lead Channel Pacing Threshold Amplitude: 1.25 V
Lead Channel Pacing Threshold Amplitude: 1.375 V
Lead Channel Pacing Threshold Pulse Width: 0.4 ms
Lead Channel Pacing Threshold Pulse Width: 0.6 ms
Lead Channel Pacing Threshold Pulse Width: 0.7 ms
Lead Channel Sensing Intrinsic Amplitude: 3.8 mV
Lead Channel Sensing Intrinsic Amplitude: 6.5 mV
Lead Channel Setting Pacing Amplitude: 2 V
Lead Channel Setting Pacing Amplitude: 2.375
Lead Channel Setting Pacing Amplitude: 2.5 V
Lead Channel Setting Pacing Pulse Width: 0.4 ms
Lead Channel Setting Pacing Pulse Width: 0.7 ms
Lead Channel Setting Sensing Sensitivity: 2 mV
Pulse Gen Model: 3242
Pulse Gen Serial Number: 7701275

## 2021-10-28 DIAGNOSIS — Z0000 Encounter for general adult medical examination without abnormal findings: Secondary | ICD-10-CM | POA: Diagnosis not present

## 2021-10-28 DIAGNOSIS — Z794 Long term (current) use of insulin: Secondary | ICD-10-CM | POA: Diagnosis not present

## 2021-10-28 DIAGNOSIS — E1122 Type 2 diabetes mellitus with diabetic chronic kidney disease: Secondary | ICD-10-CM | POA: Diagnosis not present

## 2021-10-28 DIAGNOSIS — I130 Hypertensive heart and chronic kidney disease with heart failure and stage 1 through stage 4 chronic kidney disease, or unspecified chronic kidney disease: Secondary | ICD-10-CM | POA: Diagnosis not present

## 2021-10-28 DIAGNOSIS — I5042 Chronic combined systolic (congestive) and diastolic (congestive) heart failure: Secondary | ICD-10-CM | POA: Diagnosis not present

## 2021-10-28 DIAGNOSIS — M109 Gout, unspecified: Secondary | ICD-10-CM | POA: Diagnosis not present

## 2021-10-28 DIAGNOSIS — K5190 Ulcerative colitis, unspecified, without complications: Secondary | ICD-10-CM | POA: Diagnosis not present

## 2021-10-28 DIAGNOSIS — J449 Chronic obstructive pulmonary disease, unspecified: Secondary | ICD-10-CM | POA: Diagnosis not present

## 2021-10-28 DIAGNOSIS — N1831 Chronic kidney disease, stage 3a: Secondary | ICD-10-CM | POA: Diagnosis not present

## 2021-10-28 DIAGNOSIS — E7800 Pure hypercholesterolemia, unspecified: Secondary | ICD-10-CM | POA: Diagnosis not present

## 2021-10-31 ENCOUNTER — Ambulatory Visit (INDEPENDENT_AMBULATORY_CARE_PROVIDER_SITE_OTHER): Payer: Medicare Other

## 2021-10-31 DIAGNOSIS — I5042 Chronic combined systolic (congestive) and diastolic (congestive) heart failure: Secondary | ICD-10-CM

## 2021-10-31 DIAGNOSIS — Z950 Presence of cardiac pacemaker: Secondary | ICD-10-CM

## 2021-11-01 NOTE — Progress Notes (Signed)
EPIC Encounter for ICM Monitoring  Patient Name: William Villanueva is a 71 y.o. male Date: 11/01/2021 Primary Care Physican: Tally Joe, MD Primary Cardiologist: Jens Som Electrophysiologist: Joycelyn Schmid Pacing: 96%            06/15/2021 Weight: 255 lbs 07/27/2021 Weight: 250-255 lbs 08/23/2021 Weight: 255 lbs 09/27/2021 Weight: 261 lbs   Battery Longevity: 4.9 months   AT/AF Burden <1%                                                            Spoke with patient and heart failure questions reviewed.  Pt asymptomatic for fluid accumulation.                CorVue thoracic impedance suggesting normal fluid levels.   Prescribed:   Furosemide 40 mg Take 1/2 tab in the am and lunch Saturday through Wednesday, 1 tab (40 mg total) in the morning and 1/2 tab (20 mg total) at lunch on Thursday and Friday  Spironolactone 25 mg take 0.5 tablet (12.5 mg total) once a day   Labs: 09/16/2021 Creatinine 1.10, BUN 17, Potassium 4.3, Sodium 139, GFR >60 08/12/2021 Creatinine 1.30, BUN 18, Potassium 4.5, Sodium 138, GFR 59 07/08/2021 Creatinine 1.58, BUN 28, Potassium 4.6, Sodium 137, GFR 47 06/10/2021 Creatinine 1.22, BUN 20, Potassium 4.5, Sodium 139, GFR >60 A complete set of results can be found in Results Review.   Recommendations:  No changes and encouraged to call if experiencing any fluid symptoms.   Follow-up plan: ICM clinic phone appointment on 12/05/2021.   91 day device clinic remote transmission 11/21/2021.     EP/Cardiology Office Visits:   Recall 11/29/2021 with Dr Jens Som.  Recall 08/14/2022 with Otilio Saber, PA   Copy of ICM check sent to Dr. Graciela Husbands.   3 month ICM trend: 10/31/2021.    12-14 Month ICM trend:     Karie Soda, RN 11/01/2021 4:52 PM

## 2021-11-17 ENCOUNTER — Ambulatory Visit: Payer: Medicare Other | Admitting: Hematology & Oncology

## 2021-11-17 ENCOUNTER — Inpatient Hospital Stay: Payer: Medicare Other

## 2021-11-17 DIAGNOSIS — H5203 Hypermetropia, bilateral: Secondary | ICD-10-CM | POA: Diagnosis not present

## 2021-11-17 DIAGNOSIS — H532 Diplopia: Secondary | ICD-10-CM | POA: Diagnosis not present

## 2021-11-17 DIAGNOSIS — M1811 Unilateral primary osteoarthritis of first carpometacarpal joint, right hand: Secondary | ICD-10-CM | POA: Diagnosis not present

## 2021-11-17 DIAGNOSIS — H2513 Age-related nuclear cataract, bilateral: Secondary | ICD-10-CM | POA: Diagnosis not present

## 2021-11-17 DIAGNOSIS — E119 Type 2 diabetes mellitus without complications: Secondary | ICD-10-CM | POA: Diagnosis not present

## 2021-11-17 DIAGNOSIS — R200 Anesthesia of skin: Secondary | ICD-10-CM | POA: Diagnosis not present

## 2021-11-19 ENCOUNTER — Other Ambulatory Visit: Payer: Self-pay | Admitting: Internal Medicine

## 2021-11-21 ENCOUNTER — Ambulatory Visit (INDEPENDENT_AMBULATORY_CARE_PROVIDER_SITE_OTHER): Payer: Medicare Other

## 2021-11-21 DIAGNOSIS — I441 Atrioventricular block, second degree: Secondary | ICD-10-CM | POA: Diagnosis not present

## 2021-11-23 LAB — CUP PACEART REMOTE DEVICE CHECK
Battery Remaining Longevity: 3 mo
Battery Remaining Percentage: 4 %
Battery Voltage: 2.75 V
Brady Statistic AP VP Percent: 3.5 %
Brady Statistic AP VS Percent: 1 %
Brady Statistic AS VP Percent: 92 %
Brady Statistic AS VS Percent: 1.5 %
Brady Statistic RA Percent Paced: 1.8 %
Date Time Interrogation Session: 20230719061026
Implantable Lead Implant Date: 20151223
Implantable Lead Implant Date: 20151223
Implantable Lead Implant Date: 20151223
Implantable Lead Location: 753858
Implantable Lead Location: 753859
Implantable Lead Location: 753860
Implantable Pulse Generator Implant Date: 20151223
Lead Channel Impedance Value: 400 Ohm
Lead Channel Impedance Value: 410 Ohm
Lead Channel Impedance Value: 590 Ohm
Lead Channel Pacing Threshold Amplitude: 1 V
Lead Channel Pacing Threshold Amplitude: 1.25 V
Lead Channel Pacing Threshold Amplitude: 1.625 V
Lead Channel Pacing Threshold Pulse Width: 0.4 ms
Lead Channel Pacing Threshold Pulse Width: 0.6 ms
Lead Channel Pacing Threshold Pulse Width: 0.7 ms
Lead Channel Sensing Intrinsic Amplitude: 12 mV
Lead Channel Sensing Intrinsic Amplitude: 2.2 mV
Lead Channel Setting Pacing Amplitude: 2 V
Lead Channel Setting Pacing Amplitude: 2.5 V
Lead Channel Setting Pacing Amplitude: 2.625
Lead Channel Setting Pacing Pulse Width: 0.4 ms
Lead Channel Setting Pacing Pulse Width: 0.7 ms
Lead Channel Setting Sensing Sensitivity: 2 mV
Pulse Gen Model: 3242
Pulse Gen Serial Number: 7701275

## 2021-11-24 ENCOUNTER — Inpatient Hospital Stay: Payer: Medicare Other | Attending: Hematology & Oncology

## 2021-11-24 ENCOUNTER — Inpatient Hospital Stay: Payer: Medicare Other | Admitting: Hematology & Oncology

## 2021-11-24 ENCOUNTER — Telehealth: Payer: Self-pay | Admitting: *Deleted

## 2021-11-24 ENCOUNTER — Ambulatory Visit: Payer: Medicare Other

## 2021-11-24 VITALS — BP 123/73 | HR 87 | Temp 97.8°F | Resp 20 | Ht 70.0 in | Wt 263.3 lb

## 2021-11-24 DIAGNOSIS — I441 Atrioventricular block, second degree: Secondary | ICD-10-CM | POA: Insufficient documentation

## 2021-11-24 DIAGNOSIS — D45 Polycythemia vera: Secondary | ICD-10-CM

## 2021-11-24 DIAGNOSIS — Z7982 Long term (current) use of aspirin: Secondary | ICD-10-CM | POA: Insufficient documentation

## 2021-11-24 DIAGNOSIS — R0602 Shortness of breath: Secondary | ICD-10-CM | POA: Diagnosis not present

## 2021-11-24 LAB — CBC WITH DIFFERENTIAL (CANCER CENTER ONLY)
Abs Immature Granulocytes: 0.06 10*3/uL (ref 0.00–0.07)
Basophils Absolute: 0.1 10*3/uL (ref 0.0–0.1)
Basophils Relative: 1 %
Eosinophils Absolute: 0 10*3/uL (ref 0.0–0.5)
Eosinophils Relative: 1 %
HCT: 34.9 % — ABNORMAL LOW (ref 39.0–52.0)
Hemoglobin: 11.7 g/dL — ABNORMAL LOW (ref 13.0–17.0)
Immature Granulocytes: 1 %
Lymphocytes Relative: 13 %
Lymphs Abs: 0.8 10*3/uL (ref 0.7–4.0)
MCH: 39.9 pg — ABNORMAL HIGH (ref 26.0–34.0)
MCHC: 33.5 g/dL (ref 30.0–36.0)
MCV: 119.1 fL — ABNORMAL HIGH (ref 80.0–100.0)
Monocytes Absolute: 0.6 10*3/uL (ref 0.1–1.0)
Monocytes Relative: 10 %
Neutro Abs: 4.7 10*3/uL (ref 1.7–7.7)
Neutrophils Relative %: 74 %
Platelet Count: 406 10*3/uL — ABNORMAL HIGH (ref 150–400)
RBC: 2.93 MIL/uL — ABNORMAL LOW (ref 4.22–5.81)
RDW: 15.4 % (ref 11.5–15.5)
WBC Count: 6.3 10*3/uL (ref 4.0–10.5)
nRBC: 0.5 % — ABNORMAL HIGH (ref 0.0–0.2)

## 2021-11-24 LAB — CMP (CANCER CENTER ONLY)
ALT: 8 U/L (ref 0–44)
AST: 12 U/L — ABNORMAL LOW (ref 15–41)
Albumin: 3.9 g/dL (ref 3.5–5.0)
Alkaline Phosphatase: 59 U/L (ref 38–126)
Anion gap: 7 (ref 5–15)
BUN: 18 mg/dL (ref 8–23)
CO2: 24 mmol/L (ref 22–32)
Calcium: 8.8 mg/dL — ABNORMAL LOW (ref 8.9–10.3)
Chloride: 108 mmol/L (ref 98–111)
Creatinine: 1.08 mg/dL (ref 0.61–1.24)
GFR, Estimated: >60 mL/min (ref >60)
Glucose, Bld: 118 mg/dL — ABNORMAL HIGH (ref 70–99)
Potassium: 4.1 mmol/L (ref 3.5–5.1)
Sodium: 139 mmol/L (ref 135–145)
Total Bilirubin: 0.6 mg/dL (ref 0.3–1.2)
Total Protein: 6.4 g/dL — ABNORMAL LOW (ref 6.5–8.1)

## 2021-11-24 LAB — IRON AND IRON BINDING CAPACITY (CC-WL,HP ONLY)
Iron: 54 ug/dL (ref 45–182)
Saturation Ratios: 16 % — ABNORMAL LOW (ref 17.9–39.5)
TIBC: 343 ug/dL (ref 250–450)
UIBC: 289 ug/dL (ref 117–376)

## 2021-11-24 LAB — FERRITIN: Ferritin: 44 ng/mL (ref 24–336)

## 2021-11-24 LAB — LACTATE DEHYDROGENASE: LDH: 308 U/L — ABNORMAL HIGH (ref 98–192)

## 2021-11-24 NOTE — Telephone Encounter (Signed)
Per 11/24/21 los - gave patient upcoming appointments - confirmed

## 2021-11-24 NOTE — Progress Notes (Signed)
Hematology and Oncology Follow Up Visit  [name] [id number] 1950-06-23 71 y.o. 11/24/2021   Principle Diagnosis:  Polycythemia vera-JAK2 positive Heart block-Mobitz II  Current Therapy:   Hydrea 500 mg p.o.TID -dose changed on 08/12/2021 Aspirin 81 mg by mouth daily Phlebotomy to maintain hematocrit below 45%     Interim History:  Mr.  Mclester is back for followup.  He is doing okay.  Comes in with his wife.  He is looking for a new job.  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  He is going to have his pacemaker replaced sometime this fall.  His back still bothers him.  He did have nerve ablation but this really did not seem to help.  He has had no problems with nausea or vomiting.  He did get a pill caught in his throat.  This finally came up.  However, since then, he has been feeling a little bit short of breath.  He has had no rashes.  He has had no fever.  Overall, I was his performance status is probably ECOG 1.    Medications:  Current Outpatient Medications:    allopurinol (ZYLOPRIM) 300 MG tablet, Take 300 mg by mouth daily., Disp: , Rfl:    BD PEN NEEDLE NANO U/F 32G X 4 MM MISC, , Disp: , Rfl:    budesonide (PULMICORT) 0.5 MG/2ML nebulizer solution, Generic for Pulmicort. Inhale one vial in nebulizer twice a day. Rinse mouth after use. (Patient not taking: Reported on 08/19/2021), Disp: 60 mL, Rfl: 10   carvedilol (COREG) 3.125 MG tablet, Take 1 tablet (3.125 mg total) by mouth 2 (two) times daily with a meal., Disp: 180 tablet, Rfl: 3   citalopram (CELEXA) 10 MG tablet, Take 5 mg by mouth daily. , Disp: , Rfl:    colestipol (COLESTID) 1 g tablet, Take 1 tablet by mouth in the morning, at noon, and at bedtime., Disp: , Rfl:    COVID-19 mRNA bivalent vaccine, Pfizer, (PFIZER COVID-19 VAC BIVALENT) injection, Inject into the muscle., Disp: 0.3 mL, Rfl: 0   dicyclomine (BENTYL) 20 MG tablet, Take 20 mg by mouth 4 (four) times daily., Disp: , Rfl:    folic  acid (FOLVITE) 1 MG tablet, Take 1 mg by mouth daily., Disp: , Rfl:    furosemide (LASIX) 40 MG tablet, Take 1/2 tab in the am and lunch Saturday through Wednesday, 1 tab in the morning and 1/2 tab at lunch on Thursday and Friday, Disp: 100 tablet, Rfl: 3   hydroxyurea (HYDREA) 500 MG capsule, Take 2 capsules (1,000 mg total) by mouth daily. May take with food to minimize GI side effects. Currently taking 500 mg daily but MD note says may need to increase on 12/27/2020 to twice daily., Disp: 180 capsule, Rfl: 5   insulin degludec (TRESIBA FLEXTOUCH) 100 UNIT/ML SOPN FlexTouch Pen, Inject 20 Units into the skin daily after breakfast. , Disp: , Rfl:    ipratropium-albuterol (DUONEB) 0.5-2.5 (3) MG/3ML SOLN, 1 vial three times daily and every 6 hours as needed, Disp: 360 mL, Rfl: 2   losartan (COZAAR) 25 MG tablet, Take 0.5 tablets (12.5 mg total) by mouth daily., Disp: 45 tablet, Rfl: 3   Magnesium Oxide 400 MG CAPS, Take 2 capsules (800 mg total) by mouth in the morning and at bedtime., Disp: 90 capsule, Rfl: 1   omeprazole (PRILOSEC) 20 MG capsule, Take 20 mg by mouth 2 (two) times daily before a meal. , Disp: , Rfl:  oxybutynin (DITROPAN-XL) 10 MG 24 hr tablet, Take 10 mg by mouth daily., Disp: , Rfl:    rosuvastatin (CRESTOR) 5 MG tablet, Take 5 mg by mouth daily., Disp: , Rfl:    spironolactone (ALDACTONE) 25 MG tablet, TAKE ONE-HALF TABLET BY  MOUTH DAILY, Disp: 45 tablet, Rfl: 3   tamsulosin (FLOMAX) 0.4 MG CAPS capsule, Take 0.4 mg by mouth 2 (two) times daily. , Disp: , Rfl:  No current facility-administered medications for this visit.  Facility-Administered Medications Ordered in Other Visits:    0.9 %  sodium chloride infusion, , Intravenous, Continuous, Teandra Harlan, Rudell Cobb, MD, Stopped at 05/16/13 1115  Allergies:  Allergies  Allergen Reactions   Bupropion Hives and Other (See Comments)   Cephalexin Diarrhea and Other (See Comments)    Caused C-diff, also    Fluoxetine Itching    Ibuprofen Itching   Prednisone Itching and Other (See Comments)    Abdominal pain, also    Temazepam Other (See Comments)    Dizziness    Trazodone And Nefazodone Other (See Comments)    Dizziness   Fluoxetine Hcl Itching   Prozac [Fluoxetine Hcl] Itching    Past Medical History, Surgical history, Social history, and Family History were reviewed and updated.  Review of Systems: Review of Systems  Constitutional: Negative.   HENT: Negative.    Eyes: Negative.   Respiratory:  Positive for shortness of breath.   Cardiovascular:  Positive for chest pain.  Gastrointestinal: Negative.   Genitourinary: Negative.   Musculoskeletal:  Positive for back pain.  Skin: Negative.   Neurological: Negative.   Endo/Heme/Allergies: Negative.   Psychiatric/Behavioral: Negative.      Physical Exam:  vitals were not taken for this visit.   Physical Exam Vitals reviewed.  HENT:     Head: Normocephalic and atraumatic.  Eyes:     Pupils: Pupils are equal, round, and reactive to light.  Cardiovascular:     Rate and Rhythm: Normal rate and regular rhythm.     Heart sounds: Normal heart sounds.  Pulmonary:     Effort: Pulmonary effort is normal.     Breath sounds: Normal breath sounds.  Abdominal:     General: Bowel sounds are normal.     Palpations: Abdomen is soft.  Musculoskeletal:        General: No tenderness or deformity. Normal range of motion.     Cervical back: Normal range of motion.  Lymphadenopathy:     Cervical: No cervical adenopathy.  Skin:    General: Skin is warm and dry.     Findings: No erythema or rash.  Neurological:     Mental Status: He is alert and oriented to person, place, and time.  Psychiatric:        Behavior: Behavior normal.        Thought Content: Thought content normal.        Judgment: Judgment normal.      Lab Results  Component Value Date   WBC 6.3 11/24/2021   HGB 11.7 (L) 11/24/2021   HCT 34.9 (L) 11/24/2021   MCV 119.1 (H) 11/24/2021    PLT 406 (H) 11/24/2021     Chemistry      Component Value Date/Time   NA 139 09/16/2021 0803   NA 140 05/10/2020 0905   NA 141 04/10/2017 0923   NA 140 04/05/2016 0739   K 4.3 09/16/2021 0803   K 3.5 04/10/2017 0923   K 4.2 04/05/2016 0739   CL 105 09/16/2021 0803  CL 108 04/10/2017 0923   CO2 23 09/16/2021 0803   CO2 24 04/10/2017 0923   CO2 20 (L) 04/05/2016 0739   BUN 17 09/16/2021 0803   BUN 14 05/10/2020 0905   BUN 12 04/10/2017 0923   BUN 14.7 04/05/2016 0739   CREATININE 1.10 09/16/2021 0803   CREATININE 1.1 04/10/2017 0923   CREATININE 1.1 04/05/2016 0739      Component Value Date/Time   CALCIUM 8.8 (L) 09/16/2021 0803   CALCIUM 8.4 04/10/2017 0923   CALCIUM 9.0 04/05/2016 0739   ALKPHOS 53 09/16/2021 0803   ALKPHOS 77 04/10/2017 0923   ALKPHOS 91 04/05/2016 0739   AST 17 09/16/2021 0803   AST 34 04/05/2016 0739   ALT 10 09/16/2021 0803   ALT 32 04/10/2017 0923   ALT 40 04/05/2016 0739   BILITOT 0.8 09/16/2021 0803   BILITOT 0.48 04/05/2016 0739      Impression and Plan: Mr. Valentino is 70 year old gentleman with polycythemia vera.  Everything is doing okay from my point of view.  We have not had to phlebotomize him apply for good 5 years.    His platelet count is holding pretty steady.  I looked at his blood smear under the microscope.  I really do not see anything that looked suspicious.  As such, we will continue him on the Hydrea at 3 times a day.  We will plan to get him back in another 3 months now.  I think this would be very reasonable.   Volanda Napoleon, MD 7/20/20238:26 AM

## 2021-11-25 ENCOUNTER — Telehealth (INDEPENDENT_AMBULATORY_CARE_PROVIDER_SITE_OTHER): Payer: Medicare Other | Admitting: Pulmonary Disease

## 2021-11-25 DIAGNOSIS — J449 Chronic obstructive pulmonary disease, unspecified: Secondary | ICD-10-CM

## 2021-11-25 MED ORDER — PREDNISONE 10 MG PO TABS: 40.0000 mg | ORAL_TABLET | Freq: Every day | ORAL | 0 refills | Status: AC

## 2021-11-25 NOTE — Telephone Encounter (Signed)
Called patient and he states that for two days he had a medication capsule stuck in his throat. He states that he tried for two days to get it up but had no use. Patient states that he finally got it up. Patient states since getting the medication capsule up he is having some issues breathing. Patient states hard to catching his breath or feel like he is able to get air in.   Please advise William Villanueva since Loanne Drilling is out

## 2021-11-25 NOTE — Telephone Encounter (Signed)
Virtual Visit via Telephone Note  I connected with William Villanueva on 11/25/21 at  by telephone and verified that I am speaking with the correct person using two identifiers.  Location: Patient: Home Provider: [facility]   I discussed the limitations, risks, security and privacy concerns of performing an evaluation and management service by telephone and the availability of in person appointments. I also discussed with the patient that there may be a patient responsible charge related to this service. The patient expressed understanding and agreed to proceed.   History of Present Illness: 71 year old male former smoker with emphysema/COPD, OSA not on CPAP, chronic combined heart failure, history second-degree heart block status post pacemaker who presents for telephone follow-up for acute cough and wheezing.  He reports 2 days ago he took some medication and had a capsule stick into his throat.  Unable to cough out the capsule until today.  Since then he has had shortness of breath, wheezing and nonproductive cough.  Denies fevers or chills.   Observations/Objective: No acute distress on the phone  Assessment and Plan: COPD exacerbation --Prednisone 40 mg x 5 days  Follow Up Instructions: If symptoms do not resolve after prednisone, please call office to schedule visit with NP with chest x-ray prior to visit   I discussed the assessment and treatment plan with the patient. The patient was provided an opportunity to ask questions and all were answered. The patient agreed with the plan and demonstrated an understanding of the instructions.   The patient was advised to call back or seek an in-person evaluation if the symptoms worsen or if the condition fails to improve as anticipated.  I provided 20 minutes of non-face-to-face time during this encounter.   Terri Rorrer Rodman Pickle, MD

## 2021-12-01 DIAGNOSIS — K5190 Ulcerative colitis, unspecified, without complications: Secondary | ICD-10-CM | POA: Diagnosis not present

## 2021-12-02 ENCOUNTER — Telehealth: Payer: Self-pay | Admitting: Pulmonary Disease

## 2021-12-02 ENCOUNTER — Other Ambulatory Visit: Payer: Self-pay | Admitting: Pulmonary Disease

## 2021-12-02 DIAGNOSIS — J449 Chronic obstructive pulmonary disease, unspecified: Secondary | ICD-10-CM

## 2021-12-02 MED ORDER — BUDESONIDE-FORMOTEROL FUMARATE 160-4.5 MCG/ACT IN AERO: 2.0000 | INHALATION_SPRAY | Freq: Two times a day (BID) | RESPIRATORY_TRACT | 0 refills | Status: DC

## 2021-12-02 NOTE — Telephone Encounter (Signed)
Earnest Bailey,  Please look into what local pharmacies may have Budesonide in stock. OK to send to whichever pharmacy has medication available. If unable to identify, may need to reach out to our pharmacy team for assistance.  In the meantime for over the weekend I have ordered Symbicort (no refills) to patient's preferred pharmacy. I attempted to contact patient via phone but no answer. Left detailed message regarding Symbicort use and our plan to investigate pharmacies as noted above.

## 2021-12-02 NOTE — Telephone Encounter (Signed)
Called patient and he states that Budesonide is out of stock with [facility]. Patient states he is down to one packet left. Please advise on alternative  Please advise

## 2021-12-05 ENCOUNTER — Encounter: Payer: Self-pay | Admitting: Hematology & Oncology

## 2021-12-05 ENCOUNTER — Ambulatory Visit (INDEPENDENT_AMBULATORY_CARE_PROVIDER_SITE_OTHER): Payer: Medicare Other

## 2021-12-05 ENCOUNTER — Other Ambulatory Visit (HOSPITAL_COMMUNITY): Payer: Self-pay

## 2021-12-05 ENCOUNTER — Other Ambulatory Visit: Payer: Self-pay

## 2021-12-05 DIAGNOSIS — Z950 Presence of cardiac pacemaker: Secondary | ICD-10-CM | POA: Diagnosis not present

## 2021-12-05 DIAGNOSIS — I5042 Chronic combined systolic (congestive) and diastolic (congestive) heart failure: Secondary | ICD-10-CM

## 2021-12-05 MED ORDER — BUDESONIDE 0.5 MG/2ML IN SUSP
RESPIRATORY_TRACT | 10 refills | Status: DC
  Filled 2021-12-05: qty 60, 15d supply, fill #0

## 2021-12-05 NOTE — Telephone Encounter (Signed)
Was able to contact [facility] and located Budesonide for patient. Called patient and he informed me [facility] called him right before I did and let him know they were going to ship out his medication tomorrow. I called back [place] to cancel the order.

## 2021-12-07 ENCOUNTER — Telehealth: Payer: Self-pay

## 2021-12-07 NOTE — Telephone Encounter (Signed)
Remote ICM transmission received.  Attempted call to patient regarding ICM remote transmission and left detailed message per DPR.  Advised to return call for any fluid symptoms or questions. Next ICM remote transmission scheduled 01/10/2022.

## 2021-12-07 NOTE — Progress Notes (Signed)
Spoke with patient and heart failure questions reviewed.  Pt asymptomatic for fluid accumulation.  Reports feeling well at this time and voices no complaints.  Transmission reviewed.  No changes and encouraged to call if experiencing any fluid symptoms.

## 2021-12-07 NOTE — Progress Notes (Signed)
EPIC Encounter for ICM Monitoring  Patient Name: William Villanueva is a 72 y.o. male Date: 12/07/2021 Primary Care Physican: Antony Contras, MD Primary Cardiologist: Stanford Breed Electrophysiologist: Vergie Living Pacing: 95%            06/15/2021 Weight: 255 lbs 07/27/2021 Weight: 250-255 lbs 08/23/2021 Weight: 255 lbs 09/27/2021 Weight: 261 lbs   Battery Longevity: <3 months   AT/AF Burden <1%                                                            Spoke with patient and heart failure questions reviewed.  Pt asymptomatic for fluid accumulation.                CorVue thoracic impedance suggesting normal fluid levels.   Prescribed:   Furosemide 40 mg Take 1/2 tab in the am and lunch Saturday through Wednesday, 1 tab (40 mg total) in the morning and 1/2 tab (20 mg total) at lunch on Thursday and Friday  Spironolactone 25 mg take 0.5 tablet (12.5 mg total) once a day   Labs: 09/16/2021 Creatinine 1.10, BUN 17, Potassium 4.3, Sodium 139, GFR >60 08/12/2021 Creatinine 1.30, BUN 18, Potassium 4.5, Sodium 138, GFR 59 07/08/2021 Creatinine 1.58, BUN 28, Potassium 4.6, Sodium 137, GFR 47 06/10/2021 Creatinine 1.22, BUN 20, Potassium 4.5, Sodium 139, GFR >60 A complete set of results can be found in Results Review.   Recommendations:  No changes and encouraged to call if experiencing any fluid symptoms.   Follow-up plan: ICM clinic phone appointment on 01/10/2022.   91 day device clinic remote transmission 02/23/2022.     EP/Cardiology Office Visits:   Recall 11/29/2021 with Dr Stanford Breed.  Recall 08/14/2022 with Oda Kilts, PA   Copy of ICM check sent to Dr. Caryl Comes.   3 month ICM trend: 12/05/2021.    12-14 Month ICM trend:     Rosalene Billings, RN 12/07/2021 1:00 PM

## 2021-12-12 ENCOUNTER — Ambulatory Visit: Payer: Medicare Other | Admitting: Internal Medicine

## 2021-12-12 ENCOUNTER — Encounter: Payer: Self-pay | Admitting: Internal Medicine

## 2021-12-12 ENCOUNTER — Ambulatory Visit (INDEPENDENT_AMBULATORY_CARE_PROVIDER_SITE_OTHER): Payer: Medicare Other

## 2021-12-12 DIAGNOSIS — R040 Epistaxis: Secondary | ICD-10-CM

## 2021-12-12 DIAGNOSIS — R0602 Shortness of breath: Secondary | ICD-10-CM | POA: Diagnosis not present

## 2021-12-12 DIAGNOSIS — R0609 Other forms of dyspnea: Secondary | ICD-10-CM

## 2021-12-12 LAB — D-DIMER, QUANTITATIVE: D-Dimer, Quant: 0.83 mcg/mL FEU — ABNORMAL HIGH (ref <0.50)

## 2021-12-12 NOTE — Progress Notes (Unsigned)
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 Hurting in chest but  breathing ok s inhalers needed until gradually worsened since Dec 2015 referred by [facility] for unexplained sob p admit but pfts s airflow obst 08/28/2014    Admit date: 06/21/2014 Discharge date: 06/23/2014  Primary Care Provider: Antony Contras Villanueva Primary Cardiologist: Dr. Kirk Villanueva  Discharge Diagnoses Principal Problem:   Chest tightness Active Problems:   Polycythemia vera   Back pain   Tobacco use   Congestive dilated cardiomyopathy   Pacemaker   Dyspnea     Procedures  Echocardiogram 06/22/2014 - Left ventricle: The cavity size was mildly dilated. Wall   thickness was normal. Systolic function was normal. The estimated   ejection fraction was in the range of 50% to 55%. Doppler   parameters are consistent with abnormal left ventricular   relaxation (grade 1 diastolic dysfunction). - Left atrium: The atrium was moderately dilated. - Pulmonary arteries: PA peak pressure: 38 mm Hg (S).  Impressions:  - Poor acoustic windows, even with echo contrast, limit study      2 day stress test 06/23/2014 IMPRESSION: 1. No reversible ischemia or infarction.   2. Normal left ventricular wall motion.   3. Left ventricular ejection fraction 35%   4. Intermediate-risk stress test findings* secondary to reduced ejection fraction of 35%. Otherwise there is no ischemia identified. Personally reviewed echocardiogram which appears to have reduced ejection fraction in the 40% range.     Hospital Course  The patient is a 71 year old male with past medical history of polycythemia vera, hypertension, history of nonischemic cardiomyopathy with baseline EF 45%. His last cardiac catheterization in 2013 showed mild nonobstructive CAD. He had  placement of biventricular pacemaker in December 2015. Patient presented to [facility] on [facility]. According to him, ever since pacemaker placement, he has been having shortness breath and episodic chest discomfort. He was admitted to cardiology service, serial troponin when has been negative. CTA was negative for PE or pneumothorax, was noted he had a small amount of pericardial fluid, however and no active pulmonary disease. Echocardiogram was obtained on 06/22/2014 which showed EF 65-03%, grade 1 diastolic dysfunction, PA peak pressure 38. No pericardial effusion was noted on echocardiogram. There was no obvious sign of microperforation after recently pacemaker placement. Given his persistent symptoms, 2 day lexiscan stress test was obtained which came back on 06/23/2014. The stress test shows EF 35%, normal left ventricular wall motion, no reversible ischemia or infarction.   Patient was seen in the morning of 2/16, at which time he denies any obvious chest pain. He is deemed stable for discharge from cardiology perspective. During this admission, his carvedilol was increased to 25 mg twice a day for Villanueva rate control. Imdur was added to help with chest pain. Patient will need a outpatient sleep study which her wife has already scheduled for him at the beginning of March.         07/23/2014 1st [facility] office visit/ William Villanueva  / on lisinopril 5 mg daily  Chief Complaint  Patient presents with   Pulmonary Consult    Referred by William Villanueva. Pt c/o SOB and cough since Dec 2015. He states that he wakes up in the night gasping for air and  feels SOB occ during the day. He is coughing up large amounts of sputum- normally clear, but yellow for the past 2 days.   since Feb 6th some Villanueva   ?  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 Pt states that his cough has resolved. His breathing has started to improve. He was started on CPAP approx 1 wk ago per William Villanueva in [place].   Still sob "across a parking lot" Rec No copd/just obesity /deconditioning so f/u prn        12/12/2021 Acute ov/William Villanueva re: DOE  maint on [employer]  Chief Complaint  Patient presents with   Acute Visit    Increased DOE.  He states about a month ago took a capsule and "it got caught in my windpipe"- coughed up a capsule a few days later. He was winded today walking from lobby to closest exam room. He has noticed some scrabs forming in his nose and nasal congestion.   Dyspnea:  100 ft slow pace, flat level  Cough: Villanueva now but still has sense of globus  Sleeping: bed is flat/ R side down / one pillow SABA use: maint bid neb does not take extras 02: none  Covid status:   vax x 4   No obvious day to day or daytime variability or assoc excess/ purulent sputum or mucus plugs or hemoptysis or cp or chest tightness, subjective wheeze or overt sinus or hb symptoms.   Sleeping  without nocturnal  or early am exacerbation  of respiratory  c/o's or need for noct saba. Also denies any obvious fluctuation of symptoms with weather or environmental changes or other aggravating or alleviating factors except as outlined above   No unusual exposure hx or h/o childhood pna/ asthma or knowledge of premature birth.  Current Allergies, Complete Past Medical History, Past Surgical History, Family History, and Social History were reviewed in [employer] record.  ROS  The following are not active complaints unless bolded Hoarseness, sore throat, dysphagia = globus  dental problems, itching, sneezing,  nasal congestion or  discharge of excess mucus or purulent secretions, ear ache,   fever, chills, sweats, unintended wt loss or wt gain, classically pleuritic or exertional cp,  orthopnea pnd or arm/hand swelling  or leg swelling, presyncope, palpitations, abdominal pain, anorexia, nausea, vomiting, diarrhea  or change in bowel habits or change in bladder habits, change in stools or change in urine, dysuria, hematuria,  rash, arthralgias, visual complaints, headache, numbness, weakness or ataxia or problems with walking or coordination,  change in mood or  memory.        Current Meds  Medication Sig   allopurinol (ZYLOPRIM) 300 MG tablet Take 300 mg by mouth daily.   BD PEN NEEDLE NANO U/F 32G X 4 MM MISC    budesonide (PULMICORT) 0.5 MG/2ML nebulizer solution Inhale one vial in nebulizer twice a day. **Rinse mouth after use.   carvedilol (COREG) 3.125 MG tablet Take 1 tablet (3.125 mg total) by mouth 2 (  two) times daily with a meal.   citalopram (CELEXA) 10 MG tablet Take 5 mg by mouth daily.    colestipol (COLESTID) 1 g tablet Take 1 tablet by mouth in the morning, at noon, and at bedtime.   dicyclomine (BENTYL) 20 MG tablet Take 20 mg by mouth 4 (four) times daily.   folic acid (FOLVITE) 1 MG tablet Take 1 mg by mouth daily.   furosemide (LASIX) 40 MG tablet Take 1/2 tab in the am and lunch Saturday through Wednesday, 1 tab in the morning and 1/2 tab at lunch on Thursday and Friday (Patient taking differently: 40 mg daily.)   insulin degludec (TRESIBA FLEXTOUCH) 100 UNIT/ML SOPN FlexTouch Pen Inject 20 Units into the skin daily after breakfast.    ipratropium-albuterol (DUONEB) 0.5-2.5 (3) MG/3ML SOLN Inhale one vial in nebulizer 3 times a day and every 6 hrs as needed.   losartan (COZAAR) 25 MG tablet Take 0.5 tablets (12.5 mg total) by mouth daily.   Magnesium Oxide 400 MG CAPS Take 2 capsules (800 mg total) by mouth in the morning and at bedtime.   omeprazole (PRILOSEC) 20 MG capsule Take 20 mg by mouth 2 (two)  times daily before a meal.    oxybutynin (DITROPAN-XL) 10 MG 24 hr tablet Take 10 mg by mouth daily.   rosuvastatin (CRESTOR) 5 MG tablet Take 5 mg by mouth daily.   spironolactone (ALDACTONE) 25 MG tablet TAKE ONE-HALF TABLET BY  MOUTH DAILY   tamsulosin (FLOMAX) 0.4 MG CAPS capsule Take 0.4 mg by mouth 2 (two) times daily.                  Objective:   Physical Exam   wts  12/12/2021          265  08/28/2014        277     07/23/14 268 lb 6.4 oz (121.745 kg)  06/21/14 280 lb 3.3 oz (127.1 kg)  06/17/14 278 lb (126.1 kg)    Vital signs reviewed  12/12/2021  - Note at rest 02 sats  90% on RA   General appearance:    mod obese nad    HEENT : Oropharynx  clear      Nasal turbinates nl    NECK :  without  apparent JVD/ palpable Nodes/TM    LUNGS: no acc muscle use,  Nl contour chest which is clear to A and P bilaterally without cough on insp or exp maneuvers   CV:  RRR  no s3 or murmur or increase in P2, and no edema   ABD: obese  soft and nontender with nl inspiratory excursion in the supine position. No bruits or organomegaly appreciated   MS:  Nl gait/ ext warm without deformities Or obvious joint restrictions  calf tenderness, cyanosis or clubbing    SKIN: warm and dry without lesions    NEURO:  alert, approp, nl sensorium with  no motor or cerebellar deficits apparent.    Labs ordered/ reviewed:      Chemistry      Component Value Date/Time   NA 142 12/12/2021 1612   NA 140 05/10/2020 0905   NA 141 04/10/2017 0923   NA 140 04/05/2016 0739   K 4.6 12/12/2021 1612   K 3.5 04/10/2017 0923   K 4.2 04/05/2016 0739   CL 105 12/12/2021 1612   CL 108 04/10/2017 0923   CO2 26 12/12/2021 1612   CO2 24 04/10/2017 0923   CO2 20 (L)  04/05/2016 0739   BUN 13 12/12/2021 1612   BUN 14 05/10/2020 0905   BUN 12 04/10/2017 0923   BUN 14.7 04/05/2016 0739   CREATININE 1.27 12/12/2021 1612   CREATININE 1.08 11/24/2021 0801   CREATININE 1.1 04/10/2017 0923   CREATININE  1.1 04/05/2016 0739      Component Value Date/Time   CALCIUM 8.5 12/12/2021 1612   CALCIUM 8.4 04/10/2017 0923   CALCIUM 9.0 04/05/2016 0739   ALKPHOS 59 11/24/2021 0801   ALKPHOS 77 04/10/2017 0923   ALKPHOS 91 04/05/2016 0739   AST 12 (L) 11/24/2021 0801   AST 34 04/05/2016 0739   ALT 8 11/24/2021 0801   ALT 32 04/10/2017 0923   ALT 40 04/05/2016 0739   BILITOT 0.6 11/24/2021 0801   BILITOT 0.48 04/05/2016 0739        Lab Results  Component Value Date   WBC 7.7 12/12/2021   HGB 11.9 (L) 12/12/2021   HCT 36.2 (L) 12/12/2021   MCV 118.9 Repeated and verified X2. (H) 12/12/2021   PLT 606.0 (H) 12/12/2021     Lab Results  Component Value Date   DDIMER 0.83 (H) 12/12/2021      Lab Results  Component Value Date   TSH 0.26 (L) 12/12/2021     Lab Results  Component Value Date   PROBNP 414.0 (H) 12/12/2021         CXR PA and Lateral:   12/12/2021 :    I personally reviewed images and agree with radiology impression as follows:   Increased interstitial opacities bilaterally are nonspecific and may represent mild edema or atypical infection.  Assessment:

## 2021-12-12 NOTE — Patient Instructions (Addendum)
Change prilosec Take 30- 60 min before your first and last meals of the day   GERD (REFLUX)  is an extremely common cause of respiratory symptoms just like yours , many times with no obvious heartburn at all.    It can be treated with medication, but also with lifestyle changes including elevation of the head of your bed (ideally with 6 -8inch blocks under the headboard of your bed),  Smoking cessation, avoidance of late meals, excessive alcohol, and avoid fatty foods, chocolate, peppermint, colas, red wine, and acidic juices such as orange juice.  NO MINT OR MENTHOL PRODUCTS SO NO COUGH DROPS  USE SUGARLESS CANDY INSTEAD (Jolley ranchers or Stover's or Life Savers) or even ice chips will also do - the key is to swallow to prevent all throat clearing. NO OIL BASED VITAMINS - use powdered substitutes.  Avoid fish oil when coughing.   We will call for ENT evaluation in [facility] ASAP   Please remember to go to the lab and x-ray department  for your tests - we will call you with the results when they are available.

## 2021-12-13 LAB — BASIC METABOLIC PANEL
BUN: 13 mg/dL (ref 6–23)
CO2: 26 mEq/L (ref 19–32)
Calcium: 8.5 mg/dL (ref 8.4–10.5)
Chloride: 105 mEq/L (ref 96–112)
Creatinine, Ser: 1.27 mg/dL (ref 0.40–1.50)
GFR: 57.24 mL/min — ABNORMAL LOW (ref >60.00)
Glucose, Bld: 105 mg/dL — ABNORMAL HIGH (ref 70–99)
Potassium: 4.6 mEq/L (ref 3.5–5.1)
Sodium: 142 mEq/L (ref 135–145)

## 2021-12-13 LAB — CBC WITH DIFFERENTIAL/PLATELET
Basophils Absolute: 0.1 10*3/uL (ref 0.0–0.1)
Basophils Relative: 0.9 % (ref 0.0–3.0)
Eosinophils Absolute: 0.1 10*3/uL (ref 0.0–0.7)
Eosinophils Relative: 1 % (ref 0.0–5.0)
HCT: 36.2 % — ABNORMAL LOW (ref 39.0–52.0)
Hemoglobin: 11.9 g/dL — ABNORMAL LOW (ref 13.0–17.0)
Lymphocytes Relative: 14.1 % (ref 12.0–46.0)
Lymphs Abs: 1.1 10*3/uL (ref 0.7–4.0)
MCHC: 32.8 g/dL (ref 30.0–36.0)
MCV: 118.9 fl — ABNORMAL HIGH (ref 78.0–100.0)
Monocytes Absolute: 0.8 10*3/uL (ref 0.1–1.0)
Monocytes Relative: 9.9 % (ref 3.0–12.0)
Neutro Abs: 5.7 10*3/uL (ref 1.4–7.7)
Neutrophils Relative %: 74.1 % (ref 43.0–77.0)
Platelets: 606 10*3/uL — ABNORMAL HIGH (ref 150.0–400.0)
RBC: 3.05 Mil/uL — ABNORMAL LOW (ref 4.22–5.81)
RDW: 15.3 % (ref 11.5–15.5)
WBC: 7.7 10*3/uL (ref 4.0–10.5)

## 2021-12-13 LAB — TSH: TSH: 0.26 u[IU]/mL — ABNORMAL LOW (ref 0.35–5.50)

## 2021-12-13 LAB — BRAIN NATRIURETIC PEPTIDE: Pro B Natriuretic peptide (BNP): 414 pg/mL — ABNORMAL HIGH (ref 0.0–100.0)

## 2021-12-14 ENCOUNTER — Encounter: Payer: Self-pay | Admitting: Internal Medicine

## 2021-12-14 NOTE — Assessment & Plan Note (Addendum)
Evaluated  in Pulmonary clinic/ [facility]/ William Villanueva w/u completed 08/28/14  - off acei 07/23/2014 > wheezing resolved - PFT's 08/28/14 mild restrictive changes with nl dlco p correction for alv vol  - 08/28/2014   Walked RA x 3 laps @ 186f each  stopped due to  Back pain, nl pace, no desat or sob  - 12/12/2021 recurrent sense of globus with pseudowheeze on exam > referred back to ENT/ max rx for gerd empirically   Symptoms are markedly disproportionate to objective findings and not clear to what extent this is actually a pulmonary  problem but pt does appear to have difficult to sort out respiratory symptoms of unknown origin for which  DDX  = almost all start with A and  include Adherence, Ace Inhibitors, Acid Reflux, Active Sinus Disease, Alpha 1 Antitripsin deficiency, Anxiety masquerading as Airways dz,  ABPA,  Allergy(esp in young), Aspiration (esp in elderly), Adverse effects of meds,  Active smoking or Vaping, A bunch of PE's/clot burden (a few small clots can't cause this syndrome unless there is already severe underlying pulm or vascular dz with poor reserve),  Anemia or thyroid disorder, plus two Bs  = Bronchiectasis and Beta blocker use..and one C= CHF     Adherence is always the initial "prime suspect" and is a multilayered concern that requires a "trust but verify" approach in every patient - starting with knowing how to use medications, especially inhalers, correctly, keeping up with refills and understanding the fundamental difference between maintenance and prns vs those medications only taken for a very short course and then stopped and not refilled.   ? Acid (or non-acid) GERD > always difficult to exclude as up to 75% of pts in some series report no assoc GI/ Heartburn symptoms> rec max (24h)  acid suppression and diet restrictions/ reviewed and instructions given in writing.   ? Aspiration > no evidence pill still in airway but does have sense of globus   ? Anemia/ thyroid disorder  > has PVera with macrocytosis but hgb and tsh are both abn but hgb has been same range chronically  and tsh is barely abnormal so doubt thie is issue > f/u pcp / hematology   ? Adverse effects of meds > none of the usual suspects listed although Hydrea has been reported to cause a pneumonitis in rare cases and will need to continue to monior with serial home sats  ? Allergy /asthma > hard to exclude but more likely cardiac asthma than allergic > no change rx for now with duoneb/pulmicort  ? chf > bnp elevated/ continue diuretic rx    Medical decision making was a moderatly high level of complexity in this case because of a chronic condition/diagnosis with severe exacerbation progression side effects of treatment  requiring extra time for  H and P, chart review, counseling,  and generating customized AVS unique to this office visit and charting.   Each maintenance medication was reviewed in detail including emphasizing most importantly the difference between maintenance and prns and under what circumstances the prns are to be triggered using an action plan format where appropriate. Please see avs for details which were reviewed in writing by both me and my nurse and patient given a written copy highlighted where appropriate with yellow highlighter for the patient's continued care at home along with an updated version of their medications.  Patient was asked to maintain medication reconciliation by comparing this list to the actual medications being used at home and  to contact this office right away if there is a conflict or discrepancy.

## 2021-12-19 ENCOUNTER — Other Ambulatory Visit: Payer: Self-pay

## 2021-12-19 DIAGNOSIS — J449 Chronic obstructive pulmonary disease, unspecified: Secondary | ICD-10-CM

## 2021-12-19 DIAGNOSIS — R0602 Shortness of breath: Secondary | ICD-10-CM

## 2021-12-20 DIAGNOSIS — G4730 Sleep apnea, unspecified: Secondary | ICD-10-CM | POA: Diagnosis not present

## 2021-12-20 DIAGNOSIS — R0683 Snoring: Secondary | ICD-10-CM | POA: Diagnosis not present

## 2021-12-21 NOTE — Telephone Encounter (Signed)
Recall was placed.

## 2021-12-22 ENCOUNTER — Ambulatory Visit (INDEPENDENT_AMBULATORY_CARE_PROVIDER_SITE_OTHER): Payer: Medicare Other

## 2021-12-22 DIAGNOSIS — B351 Tinea unguium: Secondary | ICD-10-CM | POA: Diagnosis not present

## 2021-12-22 DIAGNOSIS — I441 Atrioventricular block, second degree: Secondary | ICD-10-CM

## 2021-12-22 DIAGNOSIS — B354 Tinea corporis: Secondary | ICD-10-CM | POA: Diagnosis not present

## 2021-12-22 DIAGNOSIS — L578 Other skin changes due to chronic exposure to nonionizing radiation: Secondary | ICD-10-CM | POA: Diagnosis not present

## 2021-12-22 DIAGNOSIS — L570 Actinic keratosis: Secondary | ICD-10-CM | POA: Diagnosis not present

## 2021-12-22 DIAGNOSIS — D692 Other nonthrombocytopenic purpura: Secondary | ICD-10-CM | POA: Diagnosis not present

## 2021-12-22 LAB — CUP PACEART REMOTE DEVICE CHECK
Battery Remaining Longevity: 1 mo
Battery Remaining Percentage: 3 %
Battery Voltage: 2.72 V
Brady Statistic AP VP Percent: 3.5 %
Brady Statistic AP VS Percent: 1 %
Brady Statistic AS VP Percent: 92 %
Brady Statistic AS VS Percent: 1.7 %
Brady Statistic RA Percent Paced: 1.7 %
Date Time Interrogation Session: 20230814214113
Implantable Lead Implant Date: 20151223
Implantable Lead Implant Date: 20151223
Implantable Lead Implant Date: 20151223
Implantable Lead Location: 753858
Implantable Lead Location: 753859
Implantable Lead Location: 753860
Implantable Pulse Generator Implant Date: 20151223
Lead Channel Impedance Value: 360 Ohm
Lead Channel Impedance Value: 400 Ohm
Lead Channel Impedance Value: 590 Ohm
Lead Channel Pacing Threshold Amplitude: 1.125 V
Lead Channel Pacing Threshold Amplitude: 1.25 V
Lead Channel Pacing Threshold Amplitude: 1.5 V
Lead Channel Pacing Threshold Pulse Width: 0.4 ms
Lead Channel Pacing Threshold Pulse Width: 0.6 ms
Lead Channel Pacing Threshold Pulse Width: 0.7 ms
Lead Channel Sensing Intrinsic Amplitude: 1.8 mV
Lead Channel Sensing Intrinsic Amplitude: 12 mV
Lead Channel Setting Pacing Amplitude: 2.125
Lead Channel Setting Pacing Amplitude: 2.5 V
Lead Channel Setting Pacing Amplitude: 2.5 V
Lead Channel Setting Pacing Pulse Width: 0.4 ms
Lead Channel Setting Pacing Pulse Width: 0.7 ms
Lead Channel Setting Sensing Sensitivity: 2 mV
Pulse Gen Model: 3242
Pulse Gen Serial Number: 7701275

## 2021-12-27 NOTE — Progress Notes (Signed)
Remote pacemaker transmission.   

## 2021-12-30 ENCOUNTER — Encounter: Payer: Self-pay | Admitting: Internal Medicine

## 2021-12-30 ENCOUNTER — Other Ambulatory Visit: Payer: Self-pay | Admitting: *Deleted

## 2021-12-30 DIAGNOSIS — Z87891 Personal history of nicotine dependence: Secondary | ICD-10-CM

## 2021-12-30 DIAGNOSIS — G4734 Idiopathic sleep related nonobstructive alveolar hypoventilation: Secondary | ICD-10-CM | POA: Insufficient documentation

## 2021-12-30 DIAGNOSIS — Z122 Encounter for screening for malignant neoplasm of respiratory organs: Secondary | ICD-10-CM

## 2022-01-05 ENCOUNTER — Ambulatory Visit (HOSPITAL_BASED_OUTPATIENT_CLINIC_OR_DEPARTMENT_OTHER)
Admission: RE | Admit: 2022-01-05 | Discharge: 2022-01-05 | Disposition: A | Discharge status: Home / Self Care | Payer: Medicare Other | Source: Ambulatory Visit | Attending: Acute Care | Admitting: Acute Care

## 2022-01-05 DIAGNOSIS — Z87891 Personal history of nicotine dependence: Secondary | ICD-10-CM | POA: Diagnosis not present

## 2022-01-05 DIAGNOSIS — Z122 Encounter for screening for malignant neoplasm of respiratory organs: Secondary | ICD-10-CM | POA: Diagnosis not present

## 2022-01-10 ENCOUNTER — Ambulatory Visit (INDEPENDENT_AMBULATORY_CARE_PROVIDER_SITE_OTHER): Payer: Medicare Other

## 2022-01-10 DIAGNOSIS — I5042 Chronic combined systolic (congestive) and diastolic (congestive) heart failure: Secondary | ICD-10-CM

## 2022-01-10 DIAGNOSIS — Z950 Presence of cardiac pacemaker: Secondary | ICD-10-CM | POA: Diagnosis not present

## 2022-01-10 NOTE — Progress Notes (Signed)
Remote pacemaker transmission.   

## 2022-01-10 NOTE — Progress Notes (Unsigned)
EPIC Encounter for ICM Monitoring  Patient Name: William Villanueva is a 71 y.o. male Date: 01/10/2022 Primary Care Physican: Antony Contras, MD Primary Cardiologist: Stanford Breed Electrophysiologist: Vergie Living Pacing: 95%            06/15/2021 Weight: 255 lbs 07/27/2021 Weight: 250-255 lbs 08/23/2021 Weight: 255 lbs 09/27/2021 Weight: 261 lbs 01/11/2022 Weight: 255 lbs   Battery Longevity: <3 months   AT/AF Burden <1%                                                            Spoke with patient and heart failure questions reviewed.  Pt asymptomatic for fluid accumulation.  Reports feeling well at this time and voices no complaints.                 CorVue thoracic impedance suggesting possible fluid accumulation starting 8/28.   Prescribed:   Furosemide 40 mg Take 1/2 tab in the am and lunch Saturday through Wednesday, 1 tab (40 mg total) in the morning and 1/2 tab (20 mg total) at lunch on Thursday and Friday  Spironolactone 25 mg take 0.5 tablet (12.5 mg total) once a day   Labs: 09/16/2021 Creatinine 1.10, BUN 17, Potassium 4.3, Sodium 139, GFR >60 08/12/2021 Creatinine 1.30, BUN 18, Potassium 4.5, Sodium 138, GFR 59 07/08/2021 Creatinine 1.58, BUN 28, Potassium 4.6, Sodium 137, GFR 47 06/10/2021 Creatinine 1.22, BUN 20, Potassium 4.5, Sodium 139, GFR >60 A complete set of results can be found in Results Review.   Recommendations:  Recommendation to limit salt intake to 2000 mg daily and fluid intake to 64 oz daily.  Encouraged to call if experiencing any fluid symptoms.    Follow-up plan: ICM clinic phone appointment on 01/17/2022 to recheck fluid levels.   91 day device clinic remote transmission 02/23/2022.     EP/Cardiology Office Visits:   03/09/2022 with Dr Stanford Breed.  Recall 08/14/2022 with Oda Kilts, PA   Copy of ICM check sent to Dr. Caryl Comes and Dr Stanford Breed for review and recommendations if needed.   3 month ICM trend: 01/10/2022.    12-14 Month ICM trend:     Rosalene Billings,  RN 01/10/2022 7:59 AM

## 2022-01-11 ENCOUNTER — Telehealth: Payer: Self-pay | Admitting: Acute Care

## 2022-01-11 DIAGNOSIS — Z87891 Personal history of nicotine dependence: Secondary | ICD-10-CM

## 2022-01-11 DIAGNOSIS — R040 Epistaxis: Secondary | ICD-10-CM | POA: Diagnosis not present

## 2022-01-11 DIAGNOSIS — Z122 Encounter for screening for malignant neoplasm of respiratory organs: Secondary | ICD-10-CM

## 2022-01-11 NOTE — Telephone Encounter (Signed)
I have called the patient with the results of his low dose Ct Chest. I explained that his scn was read as a Lung RADS 2: nodules that are benign in appearance and behavior with a very low likelihood of becoming a clinically active cancer due to size or lack of growth. Recommendation per radiology is for a repeat LDCT in 12 months.  I also explained that his scan showed that he had some fluid on board most likely 2/2  his heart failure. He is taking lasix daily, and he is followed by cardiology. His cardiologist has reviewed the scan and called him to remind him to make sure he takes it easy on the salt and fluids. He denies any dyspnea or worsening lower extremity edema.   Denise, 12 month follow up LDCT, and fax results to his PCP. Thanks so much

## 2022-01-12 NOTE — Telephone Encounter (Signed)
CT results faxed to PCP. Order placed for 12 mth lung cancer screening f/u CT.

## 2022-01-17 ENCOUNTER — Ambulatory Visit: Payer: Medicare Other

## 2022-01-17 DIAGNOSIS — I5042 Chronic combined systolic (congestive) and diastolic (congestive) heart failure: Secondary | ICD-10-CM

## 2022-01-17 DIAGNOSIS — Z950 Presence of cardiac pacemaker: Secondary | ICD-10-CM

## 2022-01-17 NOTE — Progress Notes (Signed)
EPIC Encounter for ICM Monitoring  Patient Name: William Villanueva is a 71 y.o. male Date: 01/17/2022 Primary Care Physican: Antony Contras, MD Primary Cardiologist: Stanford Breed Electrophysiologist: Vergie Living Pacing: 95%            06/15/2021 Weight: 255 lbs 07/27/2021 Weight: 250-255 lbs 08/23/2021 Weight: 255 lbs 09/27/2021 Weight: 261 lbs 01/11/2022 Weight: 255 lbs   Battery Longevity: <3 months   AT/AF Burden <1%                                                            Spoke with patient and heart failure questions reviewed.  Pt reports sitting straight up on Saturday night to be able to breath.  He is breathing better but can still tell he has fluid because he is SOB when walking down a hallway.                CorVue thoracic impedance suggesting ongoing possible fluid accumulation x 15 days starting 8/28 but has started trending back toward baseline on 9/11.   Prescribed:   Furosemide 40 mg Take 1/2 tab in the am and lunch Saturday through Wednesday, 1 tab (40 mg total) in the morning and 1/2 tab (20 mg total) at lunch on Thursday and Friday  Spironolactone 25 mg take 0.5 tablet (12.5 mg total) once a day   Labs: 09/16/2021 Creatinine 1.10, BUN 17, Potassium 4.3, Sodium 139, GFR >60 08/12/2021 Creatinine 1.30, BUN 18, Potassium 4.5, Sodium 138, GFR 59 07/08/2021 Creatinine 1.58, BUN 28, Potassium 4.6, Sodium 137, GFR 47 06/10/2021 Creatinine 1.22, BUN 20, Potassium 4.5, Sodium 139, GFR >60 A complete set of results can be found in Results Review.   Recommendations:  Pt reports Dr Stanford Breed has approved of him take extra Furosemide when needed and he will take extra this week.  Discussed limiting salt intake.    Follow-up plan: ICM clinic phone appointment on 01/23/2022 to recheck fluid levels.   91 day device clinic remote transmission 02/23/2022.     EP/Cardiology Office Visits:   03/09/2022 with Dr Stanford Breed.  Recall 08/14/2022 with Oda Kilts, PA   Copy of ICM check sent to Dr.  Caryl Comes and Dr Stanford Breed as Juluis Rainier and review.     3 month ICM trend: 01/17/2022.    12-14 Month ICM trend:     Rosalene Billings, RN 01/17/2022 4:11 PM

## 2022-01-20 ENCOUNTER — Other Ambulatory Visit: Payer: Self-pay

## 2022-01-20 ENCOUNTER — Telehealth: Payer: Self-pay | Admitting: Cardiology

## 2022-01-20 DIAGNOSIS — I5042 Chronic combined systolic (congestive) and diastolic (congestive) heart failure: Secondary | ICD-10-CM

## 2022-01-20 NOTE — Telephone Encounter (Signed)
Pt c/o swelling: STAT is pt has developed SOB within 24 hours  How much weight have you gained and in what time span?  4 lbs over the past couple days  If swelling, where is the swelling located?  Around heart  Are you currently taking a fluid pill?  Yes, took an extra half on Lasix yesterday and urinated often, but no relief   Are you currently SOB?  No, but patient states he has been SOB recently   Do you have a log of your daily weights (if so, list)?   9/15: 260 9/10: 257 9/05: 255  Have you gained 3 pounds in a day or 5 pounds in a week?  4 lbs in a few days   Have you traveled recently?  No

## 2022-01-20 NOTE — Telephone Encounter (Signed)
Called patient, advised of message below from MD.   Blood work ordered. Patient aware to come next week, verbalized understanding

## 2022-01-20 NOTE — Telephone Encounter (Signed)
Called patient, he states that for the last 4 days he has had an increased in SOB and weight. He did speak with William Villanueva on 09/12- note in epic. He states he has gained about 4 lbs. Patient reports he was told by her to take an 0.5 tablet (20 mg) of the Lasix yesterday and today. He did do it yesterday and urinated a bit, however no change to his weight occurred. Patient states his weight is still 260 lb. He will do the increased dose today as well. He states he does not know if there is any change to his diet (he states they do eat out) I did advise to monitor salt intake.   Will route to MD for further review.   Thanks!

## 2022-01-22 LAB — CUP PACEART REMOTE DEVICE CHECK
Battery Remaining Longevity: 1 mo
Battery Remaining Percentage: 0.5 %
Battery Voltage: 2.66 V
Brady Statistic AP VP Percent: 3.1 %
Brady Statistic AP VS Percent: 1 %
Brady Statistic AS VP Percent: 92 %
Brady Statistic AS VS Percent: 1.7 %
Brady Statistic RA Percent Paced: 1.4 %
Date Time Interrogation Session: 20230915075133
Implantable Lead Implant Date: 20151223
Implantable Lead Implant Date: 20151223
Implantable Lead Implant Date: 20151223
Implantable Lead Location: 753858
Implantable Lead Location: 753859
Implantable Lead Location: 753860
Implantable Pulse Generator Implant Date: 20151223
Lead Channel Impedance Value: 380 Ohm
Lead Channel Impedance Value: 400 Ohm
Lead Channel Impedance Value: 580 Ohm
Lead Channel Pacing Threshold Amplitude: 1.125 V
Lead Channel Pacing Threshold Amplitude: 1.25 V
Lead Channel Pacing Threshold Amplitude: 1.625 V
Lead Channel Pacing Threshold Pulse Width: 0.4 ms
Lead Channel Pacing Threshold Pulse Width: 0.6 ms
Lead Channel Pacing Threshold Pulse Width: 0.7 ms
Lead Channel Sensing Intrinsic Amplitude: 1.9 mV
Lead Channel Sensing Intrinsic Amplitude: 12 mV
Lead Channel Setting Pacing Amplitude: 2.125
Lead Channel Setting Pacing Amplitude: 2.5 V
Lead Channel Setting Pacing Amplitude: 2.625
Lead Channel Setting Pacing Pulse Width: 0.4 ms
Lead Channel Setting Pacing Pulse Width: 0.7 ms
Lead Channel Setting Sensing Sensitivity: 2 mV
Pulse Gen Model: 3242
Pulse Gen Serial Number: 7701275

## 2022-01-23 ENCOUNTER — Ambulatory Visit (INDEPENDENT_AMBULATORY_CARE_PROVIDER_SITE_OTHER): Payer: Medicare Other

## 2022-01-23 ENCOUNTER — Telehealth: Payer: Self-pay | Admitting: Cardiology

## 2022-01-23 DIAGNOSIS — I5042 Chronic combined systolic (congestive) and diastolic (congestive) heart failure: Secondary | ICD-10-CM

## 2022-01-23 DIAGNOSIS — I428 Other cardiomyopathies: Secondary | ICD-10-CM

## 2022-01-23 DIAGNOSIS — Z950 Presence of cardiac pacemaker: Secondary | ICD-10-CM

## 2022-01-23 NOTE — Telephone Encounter (Signed)
Spoke with pt, Aware of dr Jacalyn Lefevre recommendations.  Follow up scheduled

## 2022-01-23 NOTE — Telephone Encounter (Signed)
Wife states patient has been SOB for a week. It is ongoing and he has gained 4 pounds in a week. He took double dose of lasix with slight improvement of SOB. A download was done this morning. Please advise.

## 2022-01-23 NOTE — Progress Notes (Unsigned)
HPI: FU cardiomyopathy/CHF. Echocardiogram in August of 2013 showed an ejection fraction of 35-40%. Cardiac catheterization in September of 2013 showed mild nonobstructive coronary disease. There was no hemodynamic evidence of restriction. Pulmonary capillary wedge pressure 10. Cardiac MRI in September of 2013 showed an ejection fraction of 34% with diffuse hypokinesis. There was no hyperenhancement or scar tissue and no evidence of cardiac hemochromatosis. TSH in September 2013 normal. 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 Patient subsequently had CRTP placed. Nuclear study March 2017 showed ejection fraction 32. There was prior inferior infarct but no ischemia. Treated medically as felt likely attenuation artifact. Echocardiogram February 2022 showed ejection fraction 45 to 50%, moderate left ventricular enlargement, grade 1 diastolic dysfunction.  Chest CT August 2023 showed small bilateral pleural effusions, pulmonary artery enlargement, emphysema, coronary atherosclerosis.  CorVue readings abnormal recently and patient contacted the office with increased dyspnea.  Added to my schedule today.  Since last seen, patient states that last week he gained 5 pounds and noticed progressive dyspnea with exertion and also at rest.  We increased his Lasix to 80 mg daily.  He has lost 3 pounds over the past several days with some improvement in his dyspnea though not back to baseline.  He does not have exertional chest pain.  He denies pedal edema or syncope.  Current Outpatient Medications  Medication Sig Dispense Refill   allopurinol (ZYLOPRIM) 300 MG tablet Take 300 mg by mouth daily.     BD PEN NEEDLE NANO U/F 32G X 4 MM MISC      budesonide (PULMICORT) 0.5 MG/2ML nebulizer solution Inhale one vial in nebulizer twice a day. **Rinse mouth after use. 60 mL 10   carvedilol (COREG) 3.125 MG tablet Take 1 tablet (3.125 mg total) by mouth 2 (two) times daily with a meal. 180 tablet 3    citalopram (CELEXA) 10 MG tablet Take 5 mg by mouth daily.      colestipol (COLESTID) 1 g tablet Take 1 tablet by mouth in the morning, at noon, and at bedtime.     dicyclomine (BENTYL) 20 MG tablet Take 20 mg by mouth 4 (four) times daily.     folic acid (FOLVITE) 1 MG tablet Take 1 mg by mouth daily.     furosemide (LASIX) 40 MG tablet Take 1/2 tab in the am and lunch Saturday through Wednesday, 1 tab in the morning and 1/2 tab at lunch on Thursday and Friday (Patient taking differently: 40 mg daily.) 100 tablet 3   hydroxyurea (HYDREA) 500 MG capsule Take 2 capsules (1,000 mg total) by mouth daily. May take with food to minimize GI side effects. Currently taking 500 mg daily but MD note says may need to increase on 12/27/2020 to twice daily. 180 capsule 5   insulin degludec (TRESIBA FLEXTOUCH) 100 UNIT/ML SOPN FlexTouch Pen Inject 20 Units into the skin daily after breakfast.      ipratropium-albuterol (DUONEB) 0.5-2.5 (3) MG/3ML SOLN Inhale one vial in nebulizer 3 times a day and every 6 hrs as needed. 120 mL 11   losartan (COZAAR) 25 MG tablet Take 0.5 tablets (12.5 mg total) by mouth daily. 45 tablet 3   Magnesium Oxide 400 MG CAPS Take 2 capsules (800 mg total) by mouth in the morning and at bedtime. 90 capsule 1   omeprazole (PRILOSEC) 20 MG capsule Take 20 mg by mouth 2 (two) times daily before a meal.      oxybutynin (DITROPAN-XL) 10 MG 24  hr tablet Take 10 mg by mouth daily.     rosuvastatin (CRESTOR) 5 MG tablet Take 5 mg by mouth daily.     spironolactone (ALDACTONE) 25 MG tablet TAKE ONE-HALF TABLET BY  MOUTH DAILY 45 tablet 3   tamsulosin (FLOMAX) 0.4 MG CAPS capsule Take 0.4 mg by mouth 2 (two) times daily.      No current facility-administered medications for this visit.   Facility-Administered Medications Ordered in Other Visits  Medication Dose Route Frequency Provider Last Rate Last Admin   0.9 %  sodium chloride infusion   Intravenous Continuous Volanda Napoleon, MD   Stopped  at 05/16/13 1115     Past Medical History:  Diagnosis Date   AV block, 2nd degree 2015   [employer]. Jude Allure Quadra pulse generator [id number], model PM 3242   Back pain    Cardiomyopathy (HCC)    Nonischemic 45%.    CHF (congestive heart failure) ([place])    Gout    Hemochromatosis    Hypertension    Hypospadias [birth year]   born with   Nephrolithiasis    Polycythemia vera(238.4)     Past Surgical History:  Procedure Laterality Date   ANKLE SURGERY     BI-VENTRICULAR PACEMAKER INSERTION N/A 04/29/2014   Procedure: BI-VENTRICULAR PACEMAKER INSERTION (CRT-P);  Surgeon: Deboraha Sprang, MD; Laterality: Left  St. Jude Allure Quadra pulse generator [phone], model [place] [id number]   CARDIAC SURGERY     COLONOSCOPY N/A 02/15/2020   Procedure: COLONOSCOPY;  Surgeon: Ronnette Juniper, MD;  Location: [facility];  Service: Gastroenterology;  Laterality: N/A;   ESOPHAGOGASTRODUODENOSCOPY (EGD) WITH PROPOFOL N/A 02/15/2020   Procedure: ESOPHAGOGASTRODUODENOSCOPY (EGD) WITH PROPOFOL;  Surgeon: Ronnette Juniper, MD;  Location: [facility];  Service: Gastroenterology;  Laterality: N/A;   PILONIDAL CYST EXCISION     POSTERIOR LAMINECTOMY / DECOMPRESSION LUMBAR SPINE     TONSILLECTOMY      Social History   Socioeconomic History   Marital status: Married    Spouse name: Not on file   Number of children: Not on file   Years of education: Not on file   Highest education level: Not on file  Occupational History   Not on file  Tobacco Use   Smoking status: Former    Packs/day: 1.00    Years: 44.00    Total pack years: 44.00    Types: Cigarettes    Start date: 06/05/1968    Quit date: 04/05/2013    Years since quitting: 8.8   Smokeless tobacco: Never   Tobacco comments:    quit in 2014  Vaping Use   Vaping Use: Never used  Substance and Sexual Activity   Alcohol use: Yes    Alcohol/week: 0.0 standard drinks of alcohol    Comment: rare   Drug use: No   Sexual activity: Not Currently  Other Topics  Concern   Not on file  Social History Narrative   Lives at home with wife in a one story home.  Has no children.  Does not work.  Getting workman's comp.  Education: 4 years trade school.    Social Determinants of Health   Financial Resource Strain: Not on file  Food Insecurity: Not on file  Transportation Needs: Not on file  Physical Activity: Not on file  Stress: Not on file  Social Connections: Not on file  Intimate Partner Violence: Not on file    Family History  Problem Relation Age of Onset   Heart disease Maternal Grandmother  Pacemaker, [place]   Stroke Mother    Other Father        Deceased, car fell on him   Diabetes Sister    Hypertension Sister     ROS: no fevers or chills, productive cough, hemoptysis, dysphasia, odynophagia, melena, hematochezia, dysuria, hematuria, rash, seizure activity, orthopnea, PND, pedal edema, claudication. Remaining systems are negative.  Physical Exam: Well-developed well-nourished in no acute distress.  Skin is warm and dry.  HEENT is normal.  Neck is supple.  Chest is clear to auscultation with normal expansion.  Cardiovascular exam is regular rate and rhythm.  Abdominal exam nontender or distended. No masses palpated. Extremities show no edema. neuro grossly intact  ECG-sinus rhythm with ventricular pacing.  Personally reviewed  A/P  1 acute on chronic combined systolic/diastolic congestive heart failure-repeat echocardiogram.  His symptoms are improving with higher dose of Lasix.  We will continue 80 mg daily.  He felt well at 256 pounds.  He is presently at 258 pounds.  I have asked him to decrease Lasix to 40 mg daily with an additional 40 mg as needed if his weight decreases to 255 pounds.  Check potassium and renal function in 2 days.  We discussed fluid restriction and low-sodium diet.  I have not added SGLT2 inhibitor due to use of Hydrea and pannus/risk of infection.  2 history of nonischemic cardiomyopathy-continue  ARB (blood pressure did not tolerate Entresto) and carvedilol.  3 hypertension-blood pressure controlled.  Continue present medications.  4 CRT-P-Per Dr. Caryl Comes.  5 morbid obesity-we again discussed the importance of weight loss.  6 coronary artery disease-coronary calcification noted on CT scan.  Continue statin.  7 hyperlipidemia-continue Crestor.  Check lipids.  Kirk Ruths, MD

## 2022-01-23 NOTE — Telephone Encounter (Signed)
Pt c/o Shortness Of Breath: STAT if SOB developed within the last 24 hours or pt is noticeably SOB on the phone  1. Are you currently SOB (can you hear that pt is SOB on the phone)? Yes, was speaking with wife not patient   2. How long have you been experiencing SOB? About a week   3. Are you SOB when sitting or when up moving around? Moving around   4. Are you currently experiencing any other symptoms? No    Increase in Lasix has not helped, besides more frequent urination. Reports his weight has went up by 4 lbs, but does not think he has any swelling. Patient was sending in transmission reading at the time of call.

## 2022-01-23 NOTE — Progress Notes (Signed)
EPIC Encounter for ICM Monitoring  Patient Name: William Villanueva is a 71 y.o. male Date: 01/23/2022 Primary Care Physican: Antony Contras, MD Primary Cardiologist: Stanford Breed Electrophysiologist: Vergie Living Pacing: 95%            06/15/2021 Weight: 255 lbs 07/27/2021 Weight: 250-255 lbs 08/23/2021 Weight: 255 lbs 09/27/2021 Weight: 261 lbs 01/11/2022 Weight: 255 lbs 01/23/2022 Weight: 260 lbs   Battery Longevity: <3 months   AT/AF Burden <1%                                                            Pt called Dr Jacalyn Lefevre office 9/18 to report SOB and weight gain.  Per phone note he report extra Lasix is not helping symptoms.                CorVue thoracic impedance suggesting ongoing possible fluid accumulation x 21 days starting 8/28.   Prescribed:   Furosemide 40 mg Take 1/2 tab in the am and lunch Saturday through Wednesday, 1 tab (40 mg total) in the morning and 1/2 tab (20 mg total) at lunch on Thursday and Friday  Spironolactone 25 mg take 0.5 tablet (12.5 mg total) once a day   Labs: 09/16/2021 Creatinine 1.10, BUN 17, Potassium 4.3, Sodium 139, GFR >60 08/12/2021 Creatinine 1.30, BUN 18, Potassium 4.5, Sodium 138, GFR 59 07/08/2021 Creatinine 1.58, BUN 28, Potassium 4.6, Sodium 137, GFR 47 06/10/2021 Creatinine 1.22, BUN 20, Potassium 4.5, Sodium 139, GFR >60 A complete set of results can be found in Results Review.   Recommendations:  Copy sent to Dr Stanford Breed for review and recommendations will be given in 9/18 phone note.    Follow-up plan: ICM clinic phone appointment on 01/30/2022 to recheck fluid levels.   91 day device clinic remote transmission 02/23/2022.     EP/Cardiology Office Visits:   03/09/2022 with Dr Stanford Breed.  Recall 08/14/2022 with Oda Kilts, PA   Copy of ICM check sent to Dr. Caryl Comes and Dr Stanford Breed for review.      3 month ICM trend: 01/23/2022.    12-14 Month ICM trend:     Rosalene Billings, RN 01/23/2022 8:46 AM

## 2022-01-23 NOTE — Progress Notes (Signed)
Returned patient call per voice mail request.  Pt reports he contacted Dr Jacalyn Lefevre office today and has appt tomorrow due to SOB and weight gain of 4-5 lbs.  Dr Stanford Breed recommended for patient to take Lasix 40 mg bid until he sees him in the office tomorrow.  Reviewed remote transmission results and advised will recheck fluid levels 9/25.

## 2022-01-24 ENCOUNTER — Encounter: Payer: Self-pay | Admitting: Cardiology

## 2022-01-24 ENCOUNTER — Ambulatory Visit: Payer: Medicare Other | Attending: Cardiology | Admitting: Cardiology

## 2022-01-24 VITALS — BP 120/64 | Ht 70.0 in | Wt 263.0 lb

## 2022-01-24 DIAGNOSIS — I428 Other cardiomyopathies: Secondary | ICD-10-CM | POA: Diagnosis not present

## 2022-01-24 DIAGNOSIS — I5042 Chronic combined systolic (congestive) and diastolic (congestive) heart failure: Secondary | ICD-10-CM

## 2022-01-24 DIAGNOSIS — E785 Hyperlipidemia, unspecified: Secondary | ICD-10-CM | POA: Diagnosis not present

## 2022-01-24 DIAGNOSIS — I10 Essential (primary) hypertension: Secondary | ICD-10-CM | POA: Diagnosis not present

## 2022-01-24 NOTE — Patient Instructions (Signed)
  Lab Work:  Your physician recommends that you return for lab work THURSDAY THIS WEEK-01/26/22  If you have labs (blood work) drawn today and your tests are completely normal, you will receive your results only by: [place] (if you have MyChart) OR A paper copy in the mail If you have any lab test that is abnormal or we need to change your treatment, we will call you to review the results.   Testing/Procedures:  Your physician has requested that you have an echocardiogram. Echocardiography is a painless test that uses sound waves to create images of your heart. It provides your doctor with information about the size and shape of your heart and how well your heart's chambers and valves are working. This procedure takes approximately one hour. There are no restrictions for this procedure. [place], you and your health needs are our priority.  As part of our continuing mission to provide you with exceptional heart care, we have created designated Provider Care Teams.  These Care Teams include your primary Cardiologist (physician) and Advanced Practice Providers (APPs -  Physician Assistants and Nurse Practitioners) who all work together to provide you with the care you need, when you need it.  We recommend signing up for the patient portal called "MyChart".  Sign up information is provided on this After Visit Summary.  MyChart is used to connect with patients for Virtual Visits (Telemedicine).  Patients are able to view lab/test results, encounter notes, upcoming appointments, etc.  Non-urgent messages can be sent to your provider as well.   To learn more about what you can do with MyChart, go to [email or web address].    Your next appointment:   4 week(s)  The format for your next appointment:   In Person  Provider:   Sande Rives, PA-C, Almyra Deforest, PA-C, or Diona Browner, NP    Then, Kirk Ruths, MD will plan to see you again  in 4 month(s).

## 2022-01-25 ENCOUNTER — Other Ambulatory Visit: Payer: Self-pay | Admitting: *Deleted

## 2022-01-25 DIAGNOSIS — I5042 Chronic combined systolic (congestive) and diastolic (congestive) heart failure: Secondary | ICD-10-CM

## 2022-01-26 DIAGNOSIS — I5042 Chronic combined systolic (congestive) and diastolic (congestive) heart failure: Secondary | ICD-10-CM | POA: Diagnosis not present

## 2022-01-27 LAB — BASIC METABOLIC PANEL
BUN/Creatinine Ratio: 16 (ref 10–24)
BUN: 18 mg/dL (ref 8–27)
CO2: 20 mmol/L (ref 20–29)
Calcium: 8.7 mg/dL (ref 8.6–10.2)
Chloride: 105 mmol/L (ref 96–106)
Creatinine, Ser: 1.15 mg/dL (ref 0.76–1.27)
Glucose: 113 mg/dL — ABNORMAL HIGH (ref 70–99)
Potassium: 4.3 mmol/L (ref 3.5–5.2)
Sodium: 140 mmol/L (ref 134–144)
eGFR: 68 mL/min/{1.73_m2} (ref >59)

## 2022-01-30 ENCOUNTER — Ambulatory Visit (INDEPENDENT_AMBULATORY_CARE_PROVIDER_SITE_OTHER): Payer: Medicare Other

## 2022-01-30 ENCOUNTER — Encounter: Payer: Self-pay | Admitting: *Deleted

## 2022-01-30 DIAGNOSIS — Z950 Presence of cardiac pacemaker: Secondary | ICD-10-CM

## 2022-01-30 DIAGNOSIS — I5042 Chronic combined systolic (congestive) and diastolic (congestive) heart failure: Secondary | ICD-10-CM

## 2022-01-31 NOTE — Progress Notes (Signed)
EPIC Encounter for ICM Monitoring  Patient Name: William Villanueva is a 71 y.o. male Date: 01/31/2022 Primary Care Physican: Antony Contras, MD Primary Cardiologist: Stanford Breed Electrophysiologist: Vergie Living Pacing: 95%            06/15/2021 Weight: 255 lbs 07/27/2021 Weight: 250-255 lbs 08/23/2021 Weight: 255 lbs 09/27/2021 Weight: 261 lbs 01/11/2022 Weight: 255 lbs 01/23/2022 Weight: 260 lbs 01/31/2022 Weight: 256 lbs   Battery Longevity: <3 months   AT/AF Burden <1%                                                            Spoke with patient and heart failure questions reviewed.  Transmission results reviewed.  He reports SOB continues when walking but not at rest and swelling of left foot continues.  He is 80% better since taking Furosemide 80 mg x 3 days as instructed by Dr Stanford Breed but still feels he may have residual fluid.                CorVue thoracic impedance suggesting fluid levels returned to normal after taking Furosemide 80 mg x 3 days.   Prescribed:   Furosemide 40 mg take 40 mg daily with an additional 40 mg as needed if his weight increases to 255 pounds (per 9/19 OV with Dr Stanford Breed but not updated in [place]). Spironolactone 25 mg take 0.5 tablet (12.5 mg total) once a day   Labs: 09/16/2021 Creatinine 1.10, BUN 17, Potassium 4.3, Sodium 139, GFR >60 08/12/2021 Creatinine 1.30, BUN 18, Potassium 4.5, Sodium 138, GFR 59 07/08/2021 Creatinine 1.58, BUN 28, Potassium 4.6, Sodium 137, GFR 47 06/10/2021 Creatinine 1.22, BUN 20, Potassium 4.5, Sodium 139, GFR >60 A complete set of results can be found in Results Review.   Recommendations: Current Furosemide dosage is 40 mg daily and aware that as discussed with Dr Stanford Breed can take extra 40 mg for weight gain.     Follow-up plan: ICM clinic phone appointment on 02/20/2022.   91 day device clinic remote transmission 02/23/2022.     EP/Cardiology Office Visits:   03/09/2022 with Dr Stanford Breed.  Recall 08/14/2022 with Oda Kilts,  PA   Copy of ICM check sent to Dr. Caryl Comes and Dr Stanford Breed as Juluis Rainier.    3 month ICM trend: 01/31/2022.    12-14 Month ICM trend:     Rosalene Billings, RN 01/31/2022 1:24 PM

## 2022-02-03 ENCOUNTER — Encounter (HOSPITAL_COMMUNITY): Payer: Self-pay

## 2022-02-03 ENCOUNTER — Emergency Department (HOSPITAL_COMMUNITY): Payer: Medicare Other

## 2022-02-03 ENCOUNTER — Emergency Department (HOSPITAL_BASED_OUTPATIENT_CLINIC_OR_DEPARTMENT_OTHER): Admit: 2022-02-03 | Discharge: 2022-02-03 | Disposition: A | Discharge status: Home / Self Care | Payer: Medicare Other

## 2022-02-03 ENCOUNTER — Inpatient Hospital Stay (HOSPITAL_COMMUNITY)
Admission: EM | Admit: 2022-02-03 | Discharge: 2022-02-10 | DRG: 291 | Disposition: A | Discharge status: Home / Self Care | Payer: Medicare Other | Attending: Internal Medicine | Admitting: Internal Medicine

## 2022-02-03 ENCOUNTER — Other Ambulatory Visit: Payer: Self-pay

## 2022-02-03 DIAGNOSIS — Z79899 Other long term (current) drug therapy: Secondary | ICD-10-CM | POA: Diagnosis not present

## 2022-02-03 DIAGNOSIS — I2510 Atherosclerotic heart disease of native coronary artery without angina pectoris: Secondary | ICD-10-CM | POA: Diagnosis not present

## 2022-02-03 DIAGNOSIS — Z87891 Personal history of nicotine dependence: Secondary | ICD-10-CM

## 2022-02-03 DIAGNOSIS — I428 Other cardiomyopathies: Secondary | ICD-10-CM

## 2022-02-03 DIAGNOSIS — D539 Nutritional anemia, unspecified: Secondary | ICD-10-CM | POA: Diagnosis not present

## 2022-02-03 DIAGNOSIS — J449 Chronic obstructive pulmonary disease, unspecified: Secondary | ICD-10-CM | POA: Diagnosis not present

## 2022-02-03 DIAGNOSIS — E785 Hyperlipidemia, unspecified: Secondary | ICD-10-CM | POA: Diagnosis present

## 2022-02-03 DIAGNOSIS — N179 Acute kidney failure, unspecified: Secondary | ICD-10-CM | POA: Diagnosis present

## 2022-02-03 DIAGNOSIS — Z7951 Long term (current) use of inhaled steroids: Secondary | ICD-10-CM

## 2022-02-03 DIAGNOSIS — Z888 Allergy status to other drugs, medicaments and biological substances status: Secondary | ICD-10-CM

## 2022-02-03 DIAGNOSIS — Z87442 Personal history of urinary calculi: Secondary | ICD-10-CM

## 2022-02-03 DIAGNOSIS — M7989 Other specified soft tissue disorders: Secondary | ICD-10-CM | POA: Diagnosis not present

## 2022-02-03 DIAGNOSIS — E119 Type 2 diabetes mellitus without complications: Secondary | ICD-10-CM

## 2022-02-03 DIAGNOSIS — R0602 Shortness of breath: Secondary | ICD-10-CM

## 2022-02-03 DIAGNOSIS — R059 Cough, unspecified: Secondary | ICD-10-CM | POA: Diagnosis not present

## 2022-02-03 DIAGNOSIS — Z6835 Body mass index (BMI) 35.0-35.9, adult: Secondary | ICD-10-CM | POA: Diagnosis not present

## 2022-02-03 DIAGNOSIS — D75839 Thrombocytosis, unspecified: Secondary | ICD-10-CM | POA: Diagnosis present

## 2022-02-03 DIAGNOSIS — Z950 Presence of cardiac pacemaker: Secondary | ICD-10-CM

## 2022-02-03 DIAGNOSIS — I420 Dilated cardiomyopathy: Secondary | ICD-10-CM | POA: Diagnosis not present

## 2022-02-03 DIAGNOSIS — J9621 Acute and chronic respiratory failure with hypoxia: Secondary | ICD-10-CM | POA: Diagnosis present

## 2022-02-03 DIAGNOSIS — I493 Ventricular premature depolarization: Secondary | ICD-10-CM | POA: Diagnosis not present

## 2022-02-03 DIAGNOSIS — E669 Obesity, unspecified: Secondary | ICD-10-CM | POA: Diagnosis present

## 2022-02-03 DIAGNOSIS — N189 Chronic kidney disease, unspecified: Secondary | ICD-10-CM | POA: Diagnosis not present

## 2022-02-03 DIAGNOSIS — E1122 Type 2 diabetes mellitus with diabetic chronic kidney disease: Secondary | ICD-10-CM | POA: Diagnosis not present

## 2022-02-03 DIAGNOSIS — N400 Enlarged prostate without lower urinary tract symptoms: Secondary | ICD-10-CM | POA: Diagnosis present

## 2022-02-03 DIAGNOSIS — N1831 Chronic kidney disease, stage 3a: Secondary | ICD-10-CM | POA: Diagnosis not present

## 2022-02-03 DIAGNOSIS — D45 Polycythemia vera: Secondary | ICD-10-CM | POA: Diagnosis not present

## 2022-02-03 DIAGNOSIS — I700 Atherosclerosis of aorta: Secondary | ICD-10-CM | POA: Diagnosis not present

## 2022-02-03 DIAGNOSIS — I130 Hypertensive heart and chronic kidney disease with heart failure and stage 1 through stage 4 chronic kidney disease, or unspecified chronic kidney disease: Secondary | ICD-10-CM | POA: Diagnosis not present

## 2022-02-03 DIAGNOSIS — Z1152 Encounter for screening for COVID-19: Secondary | ICD-10-CM

## 2022-02-03 DIAGNOSIS — Z8249 Family history of ischemic heart disease and other diseases of the circulatory system: Secondary | ICD-10-CM

## 2022-02-03 DIAGNOSIS — K219 Gastro-esophageal reflux disease without esophagitis: Secondary | ICD-10-CM | POA: Diagnosis present

## 2022-02-03 DIAGNOSIS — Z823 Family history of stroke: Secondary | ICD-10-CM

## 2022-02-03 DIAGNOSIS — I5043 Acute on chronic combined systolic (congestive) and diastolic (congestive) heart failure: Secondary | ICD-10-CM | POA: Diagnosis not present

## 2022-02-03 DIAGNOSIS — Z794 Long term (current) use of insulin: Secondary | ICD-10-CM | POA: Diagnosis not present

## 2022-02-03 DIAGNOSIS — R0600 Dyspnea, unspecified: Secondary | ICD-10-CM | POA: Diagnosis not present

## 2022-02-03 DIAGNOSIS — J9811 Atelectasis: Secondary | ICD-10-CM | POA: Diagnosis not present

## 2022-02-03 DIAGNOSIS — I442 Atrioventricular block, complete: Secondary | ICD-10-CM | POA: Diagnosis not present

## 2022-02-03 DIAGNOSIS — L0591 Pilonidal cyst without abscess: Secondary | ICD-10-CM | POA: Diagnosis present

## 2022-02-03 DIAGNOSIS — I472 Ventricular tachycardia, unspecified: Secondary | ICD-10-CM | POA: Diagnosis present

## 2022-02-03 DIAGNOSIS — I10 Essential (primary) hypertension: Secondary | ICD-10-CM | POA: Diagnosis present

## 2022-02-03 DIAGNOSIS — Z881 Allergy status to other antibiotic agents status: Secondary | ICD-10-CM

## 2022-02-03 DIAGNOSIS — J9601 Acute respiratory failure with hypoxia: Secondary | ICD-10-CM | POA: Diagnosis not present

## 2022-02-03 DIAGNOSIS — I441 Atrioventricular block, second degree: Secondary | ICD-10-CM | POA: Diagnosis present

## 2022-02-03 DIAGNOSIS — G4733 Obstructive sleep apnea (adult) (pediatric): Secondary | ICD-10-CM | POA: Diagnosis not present

## 2022-02-03 DIAGNOSIS — J90 Pleural effusion, not elsewhere classified: Secondary | ICD-10-CM | POA: Diagnosis not present

## 2022-02-03 DIAGNOSIS — Z833 Family history of diabetes mellitus: Secondary | ICD-10-CM

## 2022-02-03 LAB — CBC WITH DIFFERENTIAL/PLATELET
Abs Immature Granulocytes: 0.07 10*3/uL (ref 0.00–0.07)
Basophils Absolute: 0.1 10*3/uL (ref 0.0–0.1)
Basophils Relative: 2 %
Eosinophils Absolute: 0.1 10*3/uL (ref 0.0–0.5)
Eosinophils Relative: 1 %
HCT: 34.2 % — ABNORMAL LOW (ref 39.0–52.0)
Hemoglobin: 10.2 g/dL — ABNORMAL LOW (ref 13.0–17.0)
Immature Granulocytes: 1 %
Lymphocytes Relative: 10 %
Lymphs Abs: 0.7 10*3/uL (ref 0.7–4.0)
MCH: 33.2 pg (ref 26.0–34.0)
MCHC: 29.8 g/dL — ABNORMAL LOW (ref 30.0–36.0)
MCV: 111.4 fL — ABNORMAL HIGH (ref 80.0–100.0)
Monocytes Absolute: 0.6 10*3/uL (ref 0.1–1.0)
Monocytes Relative: 9 %
Neutro Abs: 5.4 10*3/uL (ref 1.7–7.7)
Neutrophils Relative %: 77 %
Platelets: 512 10*3/uL — ABNORMAL HIGH (ref 150–400)
RBC: 3.07 MIL/uL — ABNORMAL LOW (ref 4.22–5.81)
RDW: 16.1 % — ABNORMAL HIGH (ref 11.5–15.5)
WBC: 7 10*3/uL (ref 4.0–10.5)
nRBC: 0.7 % — ABNORMAL HIGH (ref 0.0–0.2)

## 2022-02-03 LAB — RESP PANEL BY RT-PCR (FLU A&B, COVID) ARPGX2
Influenza A by PCR: NEGATIVE
Influenza B by PCR: NEGATIVE
SARS Coronavirus 2 by RT PCR: NEGATIVE

## 2022-02-03 LAB — COMPREHENSIVE METABOLIC PANEL
ALT: 8 U/L (ref 0–44)
AST: 14 U/L — ABNORMAL LOW (ref 15–41)
Albumin: 3.5 g/dL (ref 3.5–5.0)
Alkaline Phosphatase: 58 U/L (ref 38–126)
Anion gap: 5 (ref 5–15)
BUN: 16 mg/dL (ref 8–23)
CO2: 24 mmol/L (ref 22–32)
Calcium: 8.5 mg/dL — ABNORMAL LOW (ref 8.9–10.3)
Chloride: 110 mmol/L (ref 98–111)
Creatinine, Ser: 1.27 mg/dL — ABNORMAL HIGH (ref 0.61–1.24)
GFR, Estimated: >60 mL/min (ref >60)
Glucose, Bld: 108 mg/dL — ABNORMAL HIGH (ref 70–99)
Potassium: 4 mmol/L (ref 3.5–5.1)
Sodium: 139 mmol/L (ref 135–145)
Total Bilirubin: 1 mg/dL (ref 0.3–1.2)
Total Protein: 6.9 g/dL (ref 6.5–8.1)

## 2022-02-03 LAB — BRAIN NATRIURETIC PEPTIDE: B Natriuretic Peptide: 634 pg/mL — ABNORMAL HIGH (ref 0.0–100.0)

## 2022-02-03 LAB — TROPONIN I (HIGH SENSITIVITY): Troponin I (High Sensitivity): 21 ng/L — ABNORMAL HIGH (ref <18)

## 2022-02-03 LAB — D-DIMER, QUANTITATIVE: D-Dimer, Quant: 1.04 ug/mL-FEU — ABNORMAL HIGH (ref 0.00–0.50)

## 2022-02-03 MED ORDER — FUROSEMIDE 10 MG/ML IJ SOLN
40.0000 mg | Freq: Once | INTRAMUSCULAR | Status: AC
  Administered 2022-02-03: 40 mg via INTRAVENOUS
  Filled 2022-02-03: qty 4

## 2022-02-03 MED ORDER — IOHEXOL 350 MG/ML SOLN
75.0000 mL | Freq: Once | INTRAVENOUS | Status: AC | PRN
  Administered 2022-02-03: 75 mL via INTRAVENOUS

## 2022-02-03 NOTE — ED Triage Notes (Signed)
Patient sent from [facility] for cough and SOB that has worsened x 3 weeks. Oxygen levels 89-93 at the [facility]. Patient has hx of pulmonary disease-COPD?[name] Denies cp

## 2022-02-03 NOTE — Progress Notes (Signed)
Left lower extremity venous duplex has been completed. Preliminary results can be found in CV Proc through chart review.  Results were given to [facility] PA.  02/03/22 6:55 PM Carlos Levering RVT

## 2022-02-03 NOTE — ED Provider Triage Note (Signed)
Emergency Medicine Provider Triage Evaluation Note  William Villanueva , a 71 y.o. male  was evaluated in triage.  Pt complains of shortness of breath and cough.  He has had increased weight gain.  He doubled his Lasix for the past several days and did drop some weight but continues to have persistent cough, exertional dyspnea.  History of CHF, pacemaker in place, not on any blood thinning thinners.  He has had persistent left lower extremity swelling in 1 leg.  Seen at [facility] urgent care and had oxygen saturations of 89% on room air.  94% on room air here..  Review of Systems  Positive: Shortness of breath Negative: Fever  Physical Exam  BP 120/63 (BP Location: Left Arm)   Pulse 97   Temp 98.2 F (36.8 C) (Oral)   Resp (!) 24   SpO2 94%  Gen:   Awake, no distress   Resp:  Normal effort  MSK:   Moves extremities without difficulty  Other:  Left lower extremity edema  Medical Decision Making  Medically screening exam initiated at 1:14 PM.  Appropriate orders placed.  Derald Kenner was informed that the remainder of the evaluation will be completed by another provider, this initial triage assessment does not replace that evaluation, and the importance of remaining in the ED until their evaluation is complete.  Work-up initiated   Margarita Mail, PA-C 02/03/22 1315

## 2022-02-03 NOTE — ED Provider Notes (Signed)
[facility] EMERGENCY DEPARTMENT Provider Note   CSN: [id number] Arrival date & time: 02/03/22  1246     History  No chief complaint on file.   William Villanueva is a 71 y.o. male.  HPI Patient is a 71 year old male with past medical history of polycythemia vera, CHF, dilated cardiomyopathy, HTN, secondary AV block with biventricular pacemaker, gout  Patient is presented to emergency room today with complaints of dyspnea.  He states he has been more short of breath and has had some weight gain.  He states that he has double his Lasix for the past several days and did have some weight loss however his symptoms of dyspnea have persisted.  His cough is without any hemoptysis.  He states his exertional dyspnea has progressively worsened.  He is not on any anticoagulation.  He also has noticed some swelling in his left leg for the past week or so.  He was seen in a [facility] urgent care earlier today and was found to be hypoxic and sent to the emergency room.  He has no history of hypoxia has never been on oxygen in the past.     Home Medications Prior to Admission medications   Medication Sig Start Date End Date Taking? Authorizing Provider  allopurinol (ZYLOPRIM) 300 MG tablet Take 300 mg by mouth daily. 06/30/19   [provider]  BD PEN NEEDLE NANO U/F 32G X 4 MM MISC  06/16/18   [provider]  budesonide (PULMICORT) 0.5 MG/2ML nebulizer solution Inhale one vial in nebulizer twice a day. **Rinse mouth after use. 12/05/21   Margaretha Seeds, MD  carvedilol (COREG) 3.125 MG tablet Take 1 tablet (3.125 mg total) by mouth 2 (two) times daily with a meal. 08/19/21   Deboraha Sprang, MD  citalopram (CELEXA) 10 MG tablet Take 5 mg by mouth daily.  01/27/16   [provider]  colestipol (COLESTID) 1 g tablet Take 1 tablet by mouth in the morning, at noon, and at bedtime. 04/24/20   [provider]  dicyclomine (BENTYL) 20 MG tablet Take 20 mg by  mouth 4 (four) times daily. 03/15/20   [provider]  folic acid (FOLVITE) 1 MG tablet Take 1 mg by mouth daily.    [provider]  furosemide (LASIX) 40 MG tablet Take 1/2 tab in the am and lunch Saturday through Wednesday, 1 tab in the morning and 1/2 tab at lunch on Thursday and Friday Patient taking differently: 40 mg daily. 06/02/21   Duke, Tami Lin, PA  hydroxyurea (HYDREA) 500 MG capsule Take 2 capsules (1,000 mg total) by mouth daily. May take with food to minimize GI side effects. Currently taking 500 mg daily but MD note says may need to increase on 12/27/2020 to twice daily. 12/17/20   Volanda Napoleon, MD  insulin degludec (TRESIBA FLEXTOUCH) 100 UNIT/ML SOPN FlexTouch Pen Inject 20 Units into the skin daily after breakfast.     [provider]  ipratropium-albuterol (DUONEB) 0.5-2.5 (3) MG/3ML SOLN Inhale one vial in nebulizer 3 times a day and every 6 hrs as needed. 12/02/21   Margaretha Seeds, MD  losartan (COZAAR) 25 MG tablet Take 0.5 tablets (12.5 mg total) by mouth daily. 11/21/21   Deboraha Sprang, MD  Magnesium Oxide 400 MG CAPS Take 2 capsules (800 mg total) by mouth in the morning and at bedtime. 04/26/20   Deboraha Sprang, MD  omeprazole (PRILOSEC) 20 MG capsule Take 20 mg by  mouth 2 (two) times daily before a meal.  07/05/16   [provider]  oxybutynin (DITROPAN-XL) 10 MG 24 hr tablet Take 10 mg by mouth daily. 10/02/19   [provider]  rosuvastatin (CRESTOR) 5 MG tablet Take 5 mg by mouth daily.    [provider]  spironolactone (ALDACTONE) 25 MG tablet TAKE ONE-HALF TABLET BY  MOUTH DAILY 07/04/21   Lelon Perla, MD  tamsulosin (FLOMAX) 0.4 MG CAPS capsule Take 0.4 mg by mouth 2 (two) times daily.  08/03/17   [provider]      Allergies    Bupropion, Cephalexin, Fluoxetine, Ibuprofen, Prednisone, Temazepam, Trazodone and nefazodone, Fluoxetine hcl, and Prozac [fluoxetine hcl]    Review of Systems    Review of Systems  Physical Exam Updated Vital Signs BP 117/63   Pulse 81   Temp 97.9 F (36.6 C) (Oral)   Resp 20   SpO2 94%  Physical Exam Vitals and nursing note reviewed.  Constitutional:      General: He is not in acute distress. HENT:     Head: Normocephalic and atraumatic.     Nose: Nose normal.     Mouth/Throat:     Mouth: Mucous membranes are moist.  Eyes:     General: No scleral icterus. Cardiovascular:     Rate and Rhythm: Normal rate and regular rhythm.     Pulses: Normal pulses.     Heart sounds: Normal heart sounds.  Pulmonary:     Effort: Pulmonary effort is normal. No respiratory distress.     Breath sounds: Rales present. No wheezing.     Comments: Very faint crackles in lung bases.  Speaking in full sentences, mildly tachypneic.  On 3 L nasal cannula Abdominal:     Palpations: Abdomen is soft.     Tenderness: There is no abdominal tenderness. There is no guarding or rebound.  Musculoskeletal:     Cervical back: Normal range of motion.     Right lower leg: No edema.     Left lower leg: Edema present.  Skin:    General: Skin is warm and dry.     Capillary Refill: Capillary refill takes less than 2 seconds.  Neurological:     Mental Status: He is alert. Mental status is at baseline.  Psychiatric:        Mood and Affect: Mood normal.        Behavior: Behavior normal.     ED Results / Procedures / Treatments   Labs (all labs ordered are listed, but only abnormal results are displayed) Labs Reviewed  CBC WITH DIFFERENTIAL/PLATELET - Abnormal; Notable for the following components:      Result Value   RBC 3.07 (*)    Hemoglobin 10.2 (*)    HCT 34.2 (*)    MCV 111.4 (*)    MCHC 29.8 (*)    RDW 16.1 (*)    Platelets 512 (*)    nRBC 0.7 (*)    All other components within normal limits  BRAIN NATRIURETIC PEPTIDE - Abnormal; Notable for the following components:   B Natriuretic Peptide 634.0 (*)    All other components within normal limits   COMPREHENSIVE METABOLIC PANEL - Abnormal; Notable for the following components:   Glucose, Bld 108 (*)    Creatinine, Ser 1.27 (*)    Calcium 8.5 (*)    AST 14 (*)    All other components within normal limits  D-DIMER, QUANTITATIVE - Abnormal; Notable for the following  components:   D-Dimer, Quant 1.04 (*)    All other components within normal limits  TROPONIN I (HIGH SENSITIVITY) - Abnormal; Notable for the following components:   Troponin I (High Sensitivity) 21 (*)    All other components within normal limits  RESP PANEL BY RT-PCR (FLU A&B, COVID) [id number]  TROPONIN I (HIGH SENSITIVITY)    EKG EKG Interpretation  Date/Time:  Friday February 03 2022 13:05:04 EDT Ventricular Rate:  99 PR Interval:  96 QRS Duration: 186 QT Interval:  466 QTC Calculation: 598 R Axis:   242 Text Interpretation: Atrial-sensed ventricular-paced rhythm with occasional supraventricular complexes and with occasional Premature ventricular complexes Abnormal ECG No significant change since last tracing Confirmed by Leanord Asal ([id number]) on 02/03/2022 8:23:48 PM  Radiology VAS [place] LOWER EXTREMITY VENOUS (DVT) (7a-7p)  Result Date: 02/03/2022  Lower Venous DVT Study Patient Name:  BYNUM MCCULLARS  Date of Exam:   02/03/2022 Medical Rec #: [id number]         Accession #:    [id number] Date of Birth: 10-Sep-1950          Patient Gender: M Patient Age:   31 years Exam Location:  [facility] Procedure:      VAS [place] LOWER EXTREMITY VENOUS (DVT) Referring Phys: Ova Freshwater Kendricks Reap --------------------------------------------------------------------------------  Indications: Swelling.  Risk Factors: None identified. Comparison Study: No prior studies. Performing Technologist: Oliver Hum RVT  Examination Guidelines: A complete evaluation includes B-mode imaging, spectral Doppler, color Doppler, and power Doppler as needed of all accessible portions of each vessel. Bilateral testing is considered an integral part of a  complete examination. Limited examinations for reoccurring indications may be performed as noted. The reflux portion of the exam is performed with the patient in reverse Trendelenburg.  +-----+---------------+---------+-----------+----------+--------------+ RIGHTCompressibilityPhasicitySpontaneityPropertiesThrombus Aging +-----+---------------+---------+-----------+----------+--------------+ CFV  Full           Yes      Yes                                 +-----+---------------+---------+-----------+----------+--------------+   +---------+---------------+---------+-----------+----------+--------------+ LEFT     CompressibilityPhasicitySpontaneityPropertiesThrombus Aging +---------+---------------+---------+-----------+----------+--------------+ CFV      Full           Yes      Yes                                 +---------+---------------+---------+-----------+----------+--------------+ SFJ      Full                                                        +---------+---------------+---------+-----------+----------+--------------+ FV Prox  Full                                                        +---------+---------------+---------+-----------+----------+--------------+ FV Mid   Full                                                        +---------+---------------+---------+-----------+----------+--------------+  FV DistalFull                                                        +---------+---------------+---------+-----------+----------+--------------+ PFV      Full                                                        +---------+---------------+---------+-----------+----------+--------------+ POP      Full           Yes      Yes                                 +---------+---------------+---------+-----------+----------+--------------+ PTV      Full                                                         +---------+---------------+---------+-----------+----------+--------------+ PERO     Full                                                        +---------+---------------+---------+-----------+----------+--------------+     Summary: RIGHT: - No evidence of common femoral vein obstruction.  LEFT: - There is no evidence of deep vein thrombosis in the lower extremity.  - No cystic structure found in the popliteal fossa.  *See table(s) above for measurements and observations.    Preliminary    DG Chest 2 View  Result Date: 02/03/2022 CLINICAL DATA:  Dyspnea EXAM: CHEST - 2 VIEW COMPARISON:  12/12/2021 chest radiograph. FINDINGS: Three lead left subclavian pacemaker is stable in configuration. Stable cardiomediastinal silhouette with normal heart size. No pneumothorax. Trace bilateral pleural effusions. Mild diffuse prominence of the parahilar interstitial markings. Streaky mild bibasilar atelectasis. IMPRESSION: 1. Mild diffuse prominence of the parahilar interstitial markings, favor mild pulmonary edema. 2. Trace bilateral pleural effusions. 3. Streaky mild bibasilar atelectasis. Electronically Signed   By: Ilona Sorrel M.D.   On: 02/03/2022 13:50    Procedures .Critical Care  Performed by: Tedd Sias, PA Authorized by: Tedd Sias, PA   Critical care provider statement:    Critical care time (minutes):  35   Critical care time was exclusive of:  Separately billable procedures and treating other patients and teaching time   Critical care was necessary to treat or prevent imminent or life-threatening deterioration of the following conditions:  Respiratory failure   Critical care was time spent personally by me on the following activities:  Development of treatment plan with patient or surrogate, review of old charts, re-evaluation of patient's condition, pulse oximetry, ordering and review of radiographic studies, ordering and review of laboratory studies, ordering and performing  treatments and interventions, obtaining history from patient or surrogate, examination of patient and evaluation of patient's response to treatment   Care discussed with:  admitting provider       Medications Ordered in ED Medications  furosemide (LASIX) injection 40 mg (40 mg Intravenous Given 02/03/22 1928)    ED Course/ Medical Decision Making/ A&P Clinical Course as of 02/03/22 2313  Fri Feb 03, 2022  9242 Pt is 71 year old male hx of   4-5 days ago cough worse when supine.  Dry weight is 255.   [WF]  1939 Hypoxic on ambulation. Dimer, trop and diurese most likely CHF exacerbation but PE considered. Dimer pending.  [WF]  2311 IMPRESSION: No evidence of pulmonary emboli.  Slight increase in pleural effusions when compared with the prior exam right greater than left.  New patchy airspace opacities likely representing early parenchymal infiltrates.  Scattered stable lymph nodes in the hila and mediastinum again likely reactive in nature.  Aortic Atherosclerosis (ICD10-I70.0).   Electronically Signed   By: Inez Catalina M.D.   On: 02/03/2022 23:05   [WF]    Clinical Course User Index [WF] Tedd Sias, PA                           Medical Decision Making Amount and/or Complexity of Data Reviewed Labs: ordered. Radiology: ordered.  Risk Prescription drug management. Decision regarding hospitalization.    This patient presents to the ED for concern of dyspnea, this involves a number of treatment options, and is a complaint that carries with it a high risk of complications and morbidity.  The differential diagnosis includes The causes for shortness of breath include but are not limited to Cardiac (AHF, pericardial effusion and tamponade, arrhythmias, ischemia, etc) Respiratory (COPD, asthma, pneumonia, pneumothorax, primary pulmonary hypertension, PE/VQ mismatch) Hematological (anemia) Neuromuscular (ALS, Guillain-Barr, etc)    Co morbidities: Discussed  in HPI   Brief History:  Patient is a 71 year old male with past medical history of polycythemia vera, CHF, dilated cardiomyopathy, HTN, secondary AV block with biventricular pacemaker, gout  Patient is presented to emergency room today with complaints of dyspnea.  He states he has been more short of breath and has had some weight gain.  He states that he has double his Lasix for the past several days and did have some weight loss however his symptoms of dyspnea have persisted.  His cough is without any hemoptysis.  He states his exertional dyspnea has progressively worsened.  He is not on any anticoagulation.  He also has noticed some swelling in his left leg for the past week or so.  He was seen in a [facility] urgent care earlier today and was found to be hypoxic and sent to the emergency room.  He has no history of hypoxia has never been on oxygen in the past.    EMR reviewed including pt PMHx, past surgical history and past visits to ER.   See HPI for more details   Lab Tests:   I ordered and independently interpreted labs. Labs notable for elevated dimer - PE scan ordered.   Troponin marginally elevated consistent with prior.  BNP 634 elevated.  CMP without any significant abnormal finding.  CBC with chronic anemia somewhat worse today perhaps somewhat dilutional.   Imaging Studies:  Abnormal findings. I personally reviewed all imaging studies. Imaging notable for edema on chest x-ray.  DVT study negative.   PE scan negative for PE.     Cardiac Monitoring:  The patient was maintained on a cardiac monitor.  I personally viewed and interpreted the cardiac monitored which  showed an underlying rhythm of: paced EKG non-ischemic paced   Medicines ordered:  I ordered medication including Lasix 84m  for fluid overload Reevaluation of the patient after these medicines showed that the patient improved I have reviewed the patients home medicines and have made adjustments as  needed   Critical Interventions:        Diuresed    Consults/Attending Physician   I requested consultation with Dr. GAlcario Drought  and discussed lab and imaging findings as well as pertinent plan - they recommend: Admission   Reevaluation:  After the interventions noted above I re-evaluated patient and found that they have :improved   Social Determinants of Health:      Problem List / ED Course:  Hypoxic respiratory failure secondary to fluid overload.  PE scan negative reassuring work-up.  Lasix administered with 1 L of urine output 11:15 PM    Dispostion:  After consideration of the diagnostic results and the patients response to treatment, I feel that the patent would benefit from admission to the hospital for diuresis    Final Clinical Impression(s) / ED Diagnoses Final diagnoses:  Acute respiratory failure with hypoxia ([facility]    Rx / DC Orders ED Discharge Orders     None         FTedd Sias PUtah09/29/23 2346    KLeanord AsalK, DO 02/04/22 0003

## 2022-02-03 NOTE — Progress Notes (Signed)
Remote pacemaker transmission.   

## 2022-02-04 ENCOUNTER — Observation Stay (HOSPITAL_COMMUNITY): Payer: Medicare Other

## 2022-02-04 DIAGNOSIS — Z87891 Personal history of nicotine dependence: Secondary | ICD-10-CM | POA: Diagnosis not present

## 2022-02-04 DIAGNOSIS — I441 Atrioventricular block, second degree: Secondary | ICD-10-CM | POA: Diagnosis present

## 2022-02-04 DIAGNOSIS — E119 Type 2 diabetes mellitus without complications: Secondary | ICD-10-CM

## 2022-02-04 DIAGNOSIS — J9601 Acute respiratory failure with hypoxia: Secondary | ICD-10-CM

## 2022-02-04 DIAGNOSIS — Z794 Long term (current) use of insulin: Secondary | ICD-10-CM | POA: Diagnosis not present

## 2022-02-04 DIAGNOSIS — I5043 Acute on chronic combined systolic (congestive) and diastolic (congestive) heart failure: Secondary | ICD-10-CM | POA: Diagnosis present

## 2022-02-04 DIAGNOSIS — D75839 Thrombocytosis, unspecified: Secondary | ICD-10-CM | POA: Diagnosis present

## 2022-02-04 DIAGNOSIS — I2510 Atherosclerotic heart disease of native coronary artery without angina pectoris: Secondary | ICD-10-CM | POA: Diagnosis present

## 2022-02-04 DIAGNOSIS — J9621 Acute and chronic respiratory failure with hypoxia: Secondary | ICD-10-CM | POA: Diagnosis present

## 2022-02-04 DIAGNOSIS — D45 Polycythemia vera: Secondary | ICD-10-CM

## 2022-02-04 DIAGNOSIS — I472 Ventricular tachycardia, unspecified: Secondary | ICD-10-CM | POA: Diagnosis present

## 2022-02-04 DIAGNOSIS — N179 Acute kidney failure, unspecified: Secondary | ICD-10-CM | POA: Diagnosis present

## 2022-02-04 DIAGNOSIS — N1831 Chronic kidney disease, stage 3a: Secondary | ICD-10-CM | POA: Diagnosis present

## 2022-02-04 DIAGNOSIS — I10 Essential (primary) hypertension: Secondary | ICD-10-CM | POA: Diagnosis not present

## 2022-02-04 DIAGNOSIS — E1122 Type 2 diabetes mellitus with diabetic chronic kidney disease: Secondary | ICD-10-CM | POA: Diagnosis present

## 2022-02-04 DIAGNOSIS — E669 Obesity, unspecified: Secondary | ICD-10-CM | POA: Diagnosis present

## 2022-02-04 DIAGNOSIS — I428 Other cardiomyopathies: Secondary | ICD-10-CM | POA: Diagnosis not present

## 2022-02-04 DIAGNOSIS — Z1152 Encounter for screening for COVID-19: Secondary | ICD-10-CM | POA: Diagnosis not present

## 2022-02-04 DIAGNOSIS — E785 Hyperlipidemia, unspecified: Secondary | ICD-10-CM | POA: Diagnosis present

## 2022-02-04 DIAGNOSIS — I700 Atherosclerosis of aorta: Secondary | ICD-10-CM | POA: Diagnosis present

## 2022-02-04 DIAGNOSIS — I420 Dilated cardiomyopathy: Secondary | ICD-10-CM | POA: Diagnosis present

## 2022-02-04 DIAGNOSIS — Z6835 Body mass index (BMI) 35.0-35.9, adult: Secondary | ICD-10-CM | POA: Diagnosis not present

## 2022-02-04 DIAGNOSIS — I493 Ventricular premature depolarization: Secondary | ICD-10-CM | POA: Diagnosis present

## 2022-02-04 DIAGNOSIS — D539 Nutritional anemia, unspecified: Secondary | ICD-10-CM | POA: Diagnosis present

## 2022-02-04 DIAGNOSIS — I130 Hypertensive heart and chronic kidney disease with heart failure and stage 1 through stage 4 chronic kidney disease, or unspecified chronic kidney disease: Secondary | ICD-10-CM | POA: Diagnosis present

## 2022-02-04 DIAGNOSIS — Z79899 Other long term (current) drug therapy: Secondary | ICD-10-CM | POA: Diagnosis not present

## 2022-02-04 DIAGNOSIS — K219 Gastro-esophageal reflux disease without esophagitis: Secondary | ICD-10-CM | POA: Diagnosis present

## 2022-02-04 DIAGNOSIS — J449 Chronic obstructive pulmonary disease, unspecified: Secondary | ICD-10-CM | POA: Diagnosis present

## 2022-02-04 LAB — ECHOCARDIOGRAM COMPLETE
AR max vel: 1.5 cm2
AV Area VTI: 1.32 cm2
AV Area mean vel: 1.38 cm2
AV Mean grad: 5 mmHg
AV Peak grad: 8.5 mmHg
Ao pk vel: 1.46 m/s
Area-P 1/2: 5.38 cm2
Calc EF: 38.6 %
S' Lateral: 5.7 cm
Single Plane A2C EF: 28.4 %
Single Plane A4C EF: 44.9 %

## 2022-02-04 LAB — CBG MONITORING, ED: Glucose-Capillary: 96 mg/dL (ref 70–99)

## 2022-02-04 LAB — CBC
HCT: 33.4 % — ABNORMAL LOW (ref 39.0–52.0)
Hemoglobin: 10.1 g/dL — ABNORMAL LOW (ref 13.0–17.0)
MCH: 33.4 pg (ref 26.0–34.0)
MCHC: 30.2 g/dL (ref 30.0–36.0)
MCV: 110.6 fL — ABNORMAL HIGH (ref 80.0–100.0)
Platelets: 548 10*3/uL — ABNORMAL HIGH (ref 150–400)
RBC: 3.02 MIL/uL — ABNORMAL LOW (ref 4.22–5.81)
RDW: 16.4 % — ABNORMAL HIGH (ref 11.5–15.5)
WBC: 5.7 10*3/uL (ref 4.0–10.5)
nRBC: 0.5 % — ABNORMAL HIGH (ref 0.0–0.2)

## 2022-02-04 LAB — TROPONIN I (HIGH SENSITIVITY): Troponin I (High Sensitivity): 19 ng/L — ABNORMAL HIGH (ref <18)

## 2022-02-04 LAB — C-REACTIVE PROTEIN: CRP: 7.9 mg/dL — ABNORMAL HIGH (ref <1.0)

## 2022-02-04 LAB — HEMOGLOBIN A1C
Hgb A1c MFr Bld: 5.4 % (ref 4.8–5.6)
Mean Plasma Glucose: 108.28 mg/dL

## 2022-02-04 LAB — MAGNESIUM: Magnesium: 2 mg/dL (ref 1.7–2.4)

## 2022-02-04 LAB — PROCALCITONIN: Procalcitonin: <0.1 ng/mL

## 2022-02-04 LAB — GLUCOSE, CAPILLARY
Glucose-Capillary: 92 mg/dL (ref 70–99)
Glucose-Capillary: 108 mg/dL — ABNORMAL HIGH (ref 70–99)

## 2022-02-04 LAB — HIV ANTIBODY (ROUTINE TESTING W REFLEX): HIV Screen 4th Generation wRfx: NONREACTIVE

## 2022-02-04 MED ORDER — ONDANSETRON HCL 4 MG/2ML IJ SOLN: 4.0000 mg | Freq: Four times a day (QID) | INTRAMUSCULAR | Status: DC | PRN

## 2022-02-04 MED ORDER — ENOXAPARIN SODIUM 40 MG/0.4ML IJ SOSY
40.0000 mg | PREFILLED_SYRINGE | Freq: Every day | INTRAMUSCULAR | Status: DC
  Administered 2022-02-04 – 2022-02-09 (×5): 40 mg via SUBCUTANEOUS
  Filled 2022-02-04 (×7): qty 0.4

## 2022-02-04 MED ORDER — FOLIC ACID 1 MG PO TABS
1.0000 mg | ORAL_TABLET | Freq: Every day | ORAL | Status: DC
  Administered 2022-02-04 – 2022-02-10 (×7): 1 mg via ORAL
  Filled 2022-02-04 (×7): qty 1

## 2022-02-04 MED ORDER — PERFLUTREN LIPID MICROSPHERE
1.0000 mL | INTRAVENOUS | Status: AC | PRN
  Administered 2022-02-04: 2 mL via INTRAVENOUS

## 2022-02-04 MED ORDER — SODIUM CHLORIDE 0.9% FLUSH: 3.0000 mL | INTRAVENOUS | Status: DC | PRN

## 2022-02-04 MED ORDER — OXYBUTYNIN CHLORIDE ER 10 MG PO TB24
10.0000 mg | ORAL_TABLET | Freq: Every day | ORAL | Status: DC
  Administered 2022-02-04 – 2022-02-10 (×7): 10 mg via ORAL
  Filled 2022-02-04 (×7): qty 1

## 2022-02-04 MED ORDER — HYDROXYUREA 500 MG PO CAPS
1000.0000 mg | ORAL_CAPSULE | Freq: Every day | ORAL | Status: DC
  Administered 2022-02-04 – 2022-02-10 (×7): 1000 mg via ORAL
  Filled 2022-02-04 (×8): qty 2

## 2022-02-04 MED ORDER — FUROSEMIDE 10 MG/ML IJ SOLN
40.0000 mg | Freq: Every day | INTRAMUSCULAR | Status: DC
  Administered 2022-02-04: 40 mg via INTRAVENOUS
  Filled 2022-02-04: qty 4

## 2022-02-04 MED ORDER — INSULIN GLARGINE-YFGN 100 UNIT/ML ~~LOC~~ SOLN
20.0000 [IU] | Freq: Every day | SUBCUTANEOUS | Status: DC
  Administered 2022-02-04 – 2022-02-08 (×5): 20 [IU] via SUBCUTANEOUS
  Filled 2022-02-04 (×7): qty 0.2

## 2022-02-04 MED ORDER — CARVEDILOL 3.125 MG PO TABS: 3.1250 mg | ORAL_TABLET | Freq: Two times a day (BID) | ORAL | Status: DC

## 2022-02-04 MED ORDER — LOSARTAN POTASSIUM 25 MG PO TABS: 12.5000 mg | ORAL_TABLET | Freq: Every day | ORAL | Status: DC

## 2022-02-04 MED ORDER — BUDESONIDE 0.5 MG/2ML IN SUSP
0.5000 mg | Freq: Two times a day (BID) | RESPIRATORY_TRACT | Status: DC
  Administered 2022-02-04 – 2022-02-10 (×13): 0.5 mg via RESPIRATORY_TRACT
  Filled 2022-02-04 (×13): qty 2

## 2022-02-04 MED ORDER — PANTOPRAZOLE SODIUM 40 MG PO TBEC
40.0000 mg | DELAYED_RELEASE_TABLET | Freq: Every day | ORAL | Status: DC
  Administered 2022-02-04 – 2022-02-10 (×7): 40 mg via ORAL
  Filled 2022-02-04 (×8): qty 1

## 2022-02-04 MED ORDER — SODIUM CHLORIDE 0.9 % IV SOLN: 250.0000 mL | INTRAVENOUS | Status: DC | PRN

## 2022-02-04 MED ORDER — ALLOPURINOL 300 MG PO TABS
300.0000 mg | ORAL_TABLET | Freq: Every day | ORAL | Status: DC
  Administered 2022-02-04 – 2022-02-10 (×7): 300 mg via ORAL
  Filled 2022-02-04 (×4): qty 1
  Filled 2022-02-04: qty 3
  Filled 2022-02-04 (×2): qty 1

## 2022-02-04 MED ORDER — TAMSULOSIN HCL 0.4 MG PO CAPS
0.4000 mg | ORAL_CAPSULE | Freq: Two times a day (BID) | ORAL | Status: DC
  Administered 2022-02-04 – 2022-02-10 (×14): 0.4 mg via ORAL
  Filled 2022-02-04 (×14): qty 1

## 2022-02-04 MED ORDER — IPRATROPIUM-ALBUTEROL 0.5-2.5 (3) MG/3ML IN SOLN
3.0000 mL | Freq: Two times a day (BID) | RESPIRATORY_TRACT | Status: DC
  Administered 2022-02-04 – 2022-02-06 (×5): 3 mL via RESPIRATORY_TRACT
  Filled 2022-02-04 (×5): qty 3

## 2022-02-04 MED ORDER — DICYCLOMINE HCL 20 MG PO TABS
20.0000 mg | ORAL_TABLET | Freq: Two times a day (BID) | ORAL | Status: DC
  Administered 2022-02-04 – 2022-02-10 (×14): 20 mg via ORAL
  Filled 2022-02-04 (×16): qty 1

## 2022-02-04 MED ORDER — ACETAMINOPHEN 325 MG PO TABS
650.0000 mg | ORAL_TABLET | ORAL | Status: DC | PRN
  Administered 2022-02-07: 650 mg via ORAL
  Filled 2022-02-04: qty 2

## 2022-02-04 MED ORDER — COLESTIPOL HCL 1 G PO TABS
1.0000 g | ORAL_TABLET | Freq: Two times a day (BID) | ORAL | Status: DC
  Administered 2022-02-04 – 2022-02-10 (×13): 1 g via ORAL
  Filled 2022-02-04 (×18): qty 1

## 2022-02-04 MED ORDER — SPIRONOLACTONE 12.5 MG HALF TABLET
12.5000 mg | ORAL_TABLET | Freq: Every day | ORAL | Status: DC
  Administered 2022-02-04: 12.5 mg via ORAL
  Filled 2022-02-04: qty 1

## 2022-02-04 MED ORDER — MAGNESIUM OXIDE -MG SUPPLEMENT 400 (240 MG) MG PO TABS
800.0000 mg | ORAL_TABLET | Freq: Two times a day (BID) | ORAL | Status: DC
  Administered 2022-02-04 – 2022-02-10 (×14): 800 mg via ORAL
  Filled 2022-02-04 (×14): qty 2

## 2022-02-04 MED ORDER — ROSUVASTATIN CALCIUM 5 MG PO TABS
5.0000 mg | ORAL_TABLET | Freq: Every day | ORAL | Status: DC
  Administered 2022-02-04 – 2022-02-10 (×7): 5 mg via ORAL
  Filled 2022-02-04 (×7): qty 1

## 2022-02-04 MED ORDER — SODIUM CHLORIDE 0.9% FLUSH
3.0000 mL | Freq: Two times a day (BID) | INTRAVENOUS | Status: DC
  Administered 2022-02-04 (×3): 3 mL via INTRAVENOUS

## 2022-02-04 MED ORDER — LOSARTAN POTASSIUM 25 MG PO TABS
12.5000 mg | ORAL_TABLET | Freq: Every day | ORAL | Status: DC
  Filled 2022-02-04: qty 0.5

## 2022-02-04 NOTE — Progress Notes (Signed)
PROGRESS NOTE                                                                                                                                                                                                             Patient Demographics:    William Villanueva, is a 71 y.o. male, DOB - 1950-12-18, DGL:[id number]  Outpatient Primary MD for the patient is Antony Contras, MD    LOS - 0  Admit date - 02/03/2022    No chief complaint on file.      Brief Narrative (HPI from H&P)   71 y.o. 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  He presented to the hospital with progressive shortness of breath, unexplained weight gain along with orthopnea, he was diagnosed with acute on chronic CHF and admitted to the hospital for further care.    Subjective:    William Villanueva today has, No headache, No chest pain, No abdominal pain - No Nausea, No new weakness tingling or numbness, mild non productive cough, improved SOB.   Assessment  & Plan :   Acute on chronic combined systolic (congestive) and diastolic (congestive) heart failure (HCC) - his previous EF over 1 year ago was around 45%, he has been started on IV Lasix for diuresis with good improvement, his shortness of breath has improved, his D-dimer was borderline however his CTA and lower extremity venous duplex are negative.  Continue diuresis as tolerated by his renal function and blood pressure, continue low-dose beta-blocker and ARB.  Repeat echo pending.  Once stable discharge home with outpatient cardiology follow-up.  Of note his mild bump in troponin is likely due to demand mismatch from hypoxia, trend is flat and in non-ACS pattern.  Essential hypertension - Cont home coreg, aldactone, losartan, medications adjusted for borderline blood pressure on 02/04/2022.  Polycythemia vera (HCC) - Continue hydrea, HGB 10.2 down from  11 previously.  Keep eye on this during stay.  BPH - on Flomax  Dyslipidemia.  On statin continue.  GERD.  Continue PPI.    DM2 (diabetes mellitus, type 2) ([place]) - ISS  Lab Results  Component Value Date   HGBA1C 5.4 02/04/2022   CBG (last 3)  Recent Labs    02/04/22 0742  GLUCAP 96  Condition - Fair  Family Communication  :  None  Code Status :  Full  Consults  :  None  PUD Prophylaxis : PPI   Procedures  :     TTE -   CTA -  No evidence of pulmonary emboli. Slight increase in pleural effusions when compared with the prior exam right greater than left. New patchy airspace opacities likely representing early parenchymal infiltrates. Scattered stable lymph nodes in the hila and mediastinum again likely reactive in nature. Aortic Atherosclerosis.  Leg [place] - Non DVT      Disposition Plan  :    Status is: Observation  DVT Prophylaxis  :    enoxaparin (LOVENOX) injection 40 mg Start: 02/04/22 1000 Place TED hose Start: 02/04/22 2800  Lab Results  Component Value Date   PLT 548 (H) 02/04/2022    Diet :  Diet Order             Diet heart healthy/carb modified Room service appropriate? Yes; Fluid consistency: Thin; Fluid restriction: 1500 mL Fluid  Diet effective now                    Inpatient Medications  Scheduled Meds:  allopurinol  300 mg Oral Daily   budesonide  0.5 mg Nebulization BID   [START ON 02/05/2022] carvedilol  3.125 mg Oral BID WC   colestipol  1 g Oral BID   dicyclomine  20 mg Oral BID   enoxaparin (LOVENOX) injection  40 mg Subcutaneous Daily   folic acid  1 mg Oral Daily   furosemide  40 mg Intravenous Daily   hydroxyurea  1,000 mg Oral Daily   insulin glargine-yfgn  20 Units Subcutaneous Daily   ipratropium-albuterol  3 mL Nebulization BID   [START ON 02/06/2022] losartan  12.5 mg Oral Daily   magnesium oxide  800 mg Oral BID   oxybutynin  10 mg Oral Daily   pantoprazole  40 mg Oral Daily   rosuvastatin  5 mg Oral  Daily   sodium chloride flush  3 mL Intravenous Q12H   spironolactone  12.5 mg Oral Daily   tamsulosin  0.4 mg Oral BID   Continuous Infusions:  sodium chloride     PRN Meds:.sodium chloride, acetaminophen, ondansetron (ZOFRAN) IV, sodium chloride flush  Antibiotics  :    Anti-infectives (From admission, onward)    None        Time Spent in minutes  30   Lala Lund M.D on 02/04/2022 at 8:18 AM  To page go to [email or web address]   [facility] -  Office  [phone]  See all Orders from today for further details    Objective:   Vitals:   02/04/22 0345 02/04/22 0505 02/04/22 0753 02/04/22 0808  BP:  (!) 113/49    Pulse:  78  87  Resp: 20 (!) 22  17  Temp:   98 F (36.7 C)   TempSrc:   Oral   SpO2:  92%  97%    Wt Readings from Last 3 Encounters:  01/24/22 119.3 kg  12/12/21 120.2 kg  11/24/21 119.4 kg     Intake/Output Summary (Last 24 hours) at 02/04/2022 0818 Last data filed at 02/04/2022 0116 Gross per 24 hour  Intake --  Output 1100 ml  Net -1100 ml     Physical Exam  Awake Alert, No new F.N deficits, Normal affect [place].AT,PERRAL Supple Neck, No JVD,   Symmetrical Chest wall movement, Good air movement bilaterally, CTAB  RRR,No Gallops,Rubs or new Murmurs,  +ve B.Sounds, Abd Soft, No tenderness,   No Cyanosis, Clubbing or edema     RN pressure injury documentation:     Data Review:    CBC Recent Labs  Lab 02/03/22 1314 02/04/22 0536  WBC 7.0 5.7  HGB 10.2* 10.1*  HCT 34.2* 33.4*  PLT 512* 548*  MCV 111.4* 110.6*  MCH 33.2 33.4  MCHC 29.8* 30.2  RDW 16.1* 16.4*  LYMPHSABS 0.7  --   MONOABS 0.6  --   EOSABS 0.1  --   BASOSABS 0.1  --     Electrolytes Recent Labs  Lab 02/03/22 1314 02/03/22 2023 02/04/22 0537  NA 139  --   --   K 4.0  --   --   CL 110  --   --   CO2 24  --   --   GLUCOSE 108*  --   --   BUN 16  --   --   CREATININE 1.27*  --   --   CALCIUM 8.5*  --   --   AST 14*  --   --   ALT 8  --   --    ALKPHOS 58  --   --   BILITOT 1.0  --   --   ALBUMIN 3.5  --   --   DDIMER  --  1.04*  --   HGBA1C  --   --  5.4  BNP 634.0*  --   --     ------------------------------------------------------------------------------------------------------------------ No results for input(s): "CHOL", "HDL", "LDLCALC", "TRIG", "CHOLHDL", "LDLDIRECT" in the last 72 hours.  Lab Results  Component Value Date   HGBA1C 5.4 02/04/2022    No results for input(s): "TSH", "T4TOTAL", "T3FREE", "THYROIDAB" in the last 72 hours.  Invalid input(s): "FREET3" ------------------------------------------------------------------------------------------------------------------ ID Labs Recent Labs  Lab 02/03/22 1314 02/03/22 2023 02/04/22 0536  WBC 7.0  --  5.7  PLT 512*  --  548*  DDIMER  --  1.04*  --   CREATININE 1.27*  --   --     Radiology Reports VAS [place] LOWER EXTREMITY VENOUS (DVT) (7a-7p)  Result Date: 02/04/2022  Lower Venous DVT Study Patient Name:  DEARIES MEIKLE  Date of Exam:   02/03/2022 Medical Rec #: [id number]         Accession #:    [id number] Date of Birth: [birth year]          Patient Gender: M Patient Age:   2 years Exam Location:  [facility] Procedure:      VAS [place] LOWER EXTREMITY VENOUS (DVT) Referring Phys: Ova Freshwater FONDAW --------------------------------------------------------------------------------  Indications: Swelling.  Risk Factors: None identified. Comparison Study: No prior studies. Performing Technologist: Oliver Hum RVT  Examination Guidelines: A complete evaluation includes B-mode imaging, spectral Doppler, color Doppler, and power Doppler as needed of all accessible portions of each vessel. Bilateral testing is considered an integral part of a complete examination. Limited examinations for reoccurring indications may be performed as noted. The reflux portion of the exam is performed with the patient in reverse Trendelenburg.   +-----+---------------+---------+-----------+----------+--------------+ RIGHTCompressibilityPhasicitySpontaneityPropertiesThrombus Aging +-----+---------------+---------+-----------+----------+--------------+ CFV  Full           Yes      Yes                                 +-----+---------------+---------+-----------+----------+--------------+   +---------+---------------+---------+-----------+----------+--------------+ LEFT     CompressibilityPhasicitySpontaneityPropertiesThrombus Aging +---------+---------------+---------+-----------+----------+--------------+  CFV      Full           Yes      Yes                                 +---------+---------------+---------+-----------+----------+--------------+ SFJ      Full                                                        +---------+---------------+---------+-----------+----------+--------------+ FV Prox  Full                                                        +---------+---------------+---------+-----------+----------+--------------+ FV Mid   Full                                                        +---------+---------------+---------+-----------+----------+--------------+ FV DistalFull                                                        +---------+---------------+---------+-----------+----------+--------------+ PFV      Full                                                        +---------+---------------+---------+-----------+----------+--------------+ POP      Full           Yes      Yes                                 +---------+---------------+---------+-----------+----------+--------------+ PTV      Full                                                        +---------+---------------+---------+-----------+----------+--------------+ PERO     Full                                                         +---------+---------------+---------+-----------+----------+--------------+    Summary: RIGHT: - No evidence of common femoral vein obstruction.  LEFT: - There is no evidence of deep vein thrombosis in the lower extremity.  - No cystic structure found in the popliteal fossa.  *See table(s) above for measurements and observations. Electronically signed by Deitra Mayo MD on 02/04/2022 at  6:14:44 AM.    Final    CT Angio Chest PE W/Cm &/Or Wo Cm  Result Date: 02/03/2022 CLINICAL DATA:  Cough and shortness of breath for several weeks EXAM: CT ANGIOGRAPHY CHEST WITH CONTRAST TECHNIQUE: Multidetector CT imaging of the chest was performed using the standard protocol during bolus administration of intravenous contrast. Multiplanar CT image reconstructions and MIPs were obtained to evaluate the vascular anatomy. RADIATION DOSE REDUCTION: This exam was performed according to the departmental dose-optimization program which includes automated exposure control, adjustment of the mA and/or kV according to patient size and/or use of iterative reconstruction technique. CONTRAST:  32m OMNIPAQUE IOHEXOL 350 MG/ML SOLN COMPARISON:  Chest x-ray from earlier in the same day, CT from 01/05/2022. FINDINGS: Cardiovascular: Atherosclerotic calcifications of the aorta are and its branches are seen although the degree of enhancement is minimal. No cardiac enlargement is noted. Pacing device is again seen. Pulmonary artery shows a normal branching pattern. No filling defect to suggest pulmonary embolism is noted. Scattered coronary calcifications are noted as well. Mediastinum/Nodes: Thoracic inlet is within normal limits. Right paratracheal and scattered hilar nodes are again seen stable from the prior exam felt to be reactive in nature. The esophagus as visualized is within normal limits. Lungs/Pleura: Small effusions are noted bilaterally. These of increased somewhat in the interval from the prior exam right greater than left  similar to the prior study. Lungs demonstrates some patchy airspace opacities are noted consistent with multifocal pneumonia. These have increased from the prior exam. Upper Abdomen: Visualized upper abdomen is within normal limits. Musculoskeletal: Degenerative changes of the thoracic spine are noted. No acute rib abnormality is seen. Review of the MIP images confirms the above findings. IMPRESSION: No evidence of pulmonary emboli. Slight increase in pleural effusions when compared with the prior exam right greater than left. New patchy airspace opacities likely representing early parenchymal infiltrates. Scattered stable lymph nodes in the hila and mediastinum again likely reactive in nature. Aortic Atherosclerosis (ICD10-I70.0). Electronically Signed   By: MInez CatalinaM.D.   On: 02/03/2022 23:05   DG Chest 2 View  Result Date: 02/03/2022 CLINICAL DATA:  Dyspnea EXAM: CHEST - 2 VIEW COMPARISON:  12/12/2021 chest radiograph. FINDINGS: Three lead left subclavian pacemaker is stable in configuration. Stable cardiomediastinal silhouette with normal heart size. No pneumothorax. Trace bilateral pleural effusions. Mild diffuse prominence of the parahilar interstitial markings. Streaky mild bibasilar atelectasis. IMPRESSION: 1. Mild diffuse prominence of the parahilar interstitial markings, favor mild pulmonary edema. 2. Trace bilateral pleural effusions. 3. Streaky mild bibasilar atelectasis. Electronically Signed   By: JIlona SorrelM.D.   On: 02/03/2022 13:50

## 2022-02-04 NOTE — Assessment & Plan Note (Addendum)
\  his glucose remained well controlled during his hospitalization, he was continued with basal insulin, with good toleration.  Follow up as outpatient.

## 2022-02-04 NOTE — ED Notes (Signed)
ED TO INPATIENT HANDOFF REPORT    S Name/Age/Gender William Villanueva 71 y.o. male Room/Bed: 026C/026C  Code Status   Code Status: Full Code  Home/SNF/Other Home Patient oriented to: self, place, time, and situation Is this baseline? Yes   Triage Complete: Triage complete  Chief Complaint Acute on chronic combined systolic (congestive) and diastolic (congestive) heart failure ([place]) [I50.43]  Triage Note Patient sent from [facility] for cough and SOB that has worsened x 3 weeks. Oxygen levels 89-93 at the [facility]cc. Patient has hx of pulmonary disease-COPD?[name] Denies cp   Allergies Allergies  Allergen Reactions   Bupropion Hives and Other (See Comments)   Cephalexin Diarrhea and Other (See Comments)    Caused C-diff, also    Fluoxetine Itching   Ibuprofen Itching   Prednisone Itching and Other (See Comments)    Abdominal pain, also    Temazepam Other (See Comments)    Dizziness    Trazodone And Nefazodone Other (See Comments)    Dizziness   Fluoxetine Hcl Itching   Prozac [Fluoxetine Hcl] Itching    Level of Care/Admitting Diagnosis ED Disposition     ED Disposition  Admit   Condition  --   [place]: [place] [[id number]]  Level of Care: Telemetry Cardiac [103]  May place patient in observation at [facility] or [place] if equivalent level of care is available:: No  Covid Evaluation: Asymptomatic - no recent exposure (last 10 days) testing not required  Diagnosis: Acute on chronic combined systolic (congestive) and diastolic (congestive) heart failure [facility]) [[id number]]  Admitting Physician: Etta Quill [phone]  Attending Physician: Etta Quill [phone]          B Medical/Surgery History Past Medical History:  Diagnosis Date   AV block, 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 Jude Allure Quadra pulse generator [id number], model PM 3242   Back pain    Cardiomyopathy ([place])    Nonischemic 45%.    CHF (congestive heart failure) ([place])     Gout    Hemochromatosis    Hypertension    Hypospadias Jun 30, 1950   born with   Nephrolithiasis    Polycythemia vera(238.4)    Past Surgical History:  Procedure Laterality Date   ANKLE SURGERY     BI-VENTRICULAR PACEMAKER INSERTION N/A 04/29/2014   Procedure: BI-VENTRICULAR PACEMAKER INSERTION (CRT-P);  Surgeon: Deboraha Sprang, MD; Laterality: Left  St. Jude Allure Quadra pulse generator [phone], model [place] [id number]   CARDIAC SURGERY     COLONOSCOPY N/A 02/15/2020   Procedure: COLONOSCOPY;  Surgeon: Ronnette Juniper, MD;  Location: [place];  Service: Gastroenterology;  Laterality: N/A;   ESOPHAGOGASTRODUODENOSCOPY (EGD) WITH PROPOFOL N/A 02/15/2020   Procedure: ESOPHAGOGASTRODUODENOSCOPY (EGD) WITH PROPOFOL;  Surgeon: Ronnette Juniper, MD;  Location: [place];  Service: Gastroenterology;  Laterality: N/A;   PILONIDAL CYST EXCISION     POSTERIOR LAMINECTOMY / DECOMPRESSION LUMBAR SPINE     TONSILLECTOMY       A IV Location/Drains/Wounds Patient Lines/Drains/Airways Status     Active Line/Drains/Airways     Name Placement date Placement time Site Days   Peripheral IV 02/03/22 20 G 1" Posterior;Proximal;Right Forearm 02/03/22  1925  Forearm  1   Wound / Incision (Open or Dehisced) 02/12/20 (MASD) Moisture Associated Skin Damage Groin Right;Left MASD on both abdominal folds and both groin 02/12/20  2355  Groin  723            Intake/Output Last 24 hours  Intake/Output Summary (Last 24  hours) at 02/04/2022 1214 Last data filed at 02/04/2022 0116 Gross per 24 hour  Intake --  Output 1100 ml  Net -1100 ml    Labs/Imaging Results for orders placed or performed during the hospital encounter of 02/03/22 (from the past 48 hour(s))  CBC with Differential     Status: Abnormal   Collection Time: 02/03/22  1:14 PM  Result Value Ref Range   WBC 7.0 4.0 - 10.5 K/uL   RBC 3.07 (L) 4.22 - 5.81 MIL/uL   Hemoglobin 10.2 (L) 13.0 - 17.0 g/dL   HCT 34.2 (L) 39.0 - 52.0 %   MCV 111.4 (H)  80.0 - 100.0 fL   MCH 33.2 26.0 - 34.0 pg   MCHC 29.8 (L) 30.0 - 36.0 g/dL   RDW 16.1 (H) 11.5 - 15.5 %   Platelets 512 (H) 150 - 400 K/uL   nRBC 0.7 (H) 0.0 - 0.2 %   Neutrophils Relative % 77 %   Neutro Abs 5.4 1.7 - 7.7 K/uL   Lymphocytes Relative 10 %   Lymphs Abs 0.7 0.7 - 4.0 K/uL   Monocytes Relative 9 %   Monocytes Absolute 0.6 0.1 - 1.0 K/uL   Eosinophils Relative 1 %   Eosinophils Absolute 0.1 0.0 - 0.5 K/uL   Basophils Relative 2 %   Basophils Absolute 0.1 0.0 - 0.1 K/uL   WBC Morphology MORPHOLOGY UNREMARKABLE    RBC Morphology MORPHOLOGY UNREMARKABLE    Immature Granulocytes 1 %   Abs Immature Granulocytes 0.07 0.00 - 0.07 K/uL    Comment: Performed at [facility], [address], [place], [place] [address]  Brain natriuretic peptide     Status: Abnormal   Collection Time: 02/03/22  1:14 PM  Result Value Ref Range   B Natriuretic Peptide 634.0 (H) 0.0 - 100.0 pg/mL    Comment: Performed at [facility] [address] [place], [place] [address]  Comprehensive metabolic panel     Status: Abnormal   Collection Time: 02/03/22  1:14 PM  Result Value Ref Range   Sodium 139 135 - 145 mmol/L   Potassium 4.0 3.5 - 5.1 mmol/L   Chloride 110 98 - 111 mmol/L   CO2 24 22 - 32 mmol/L   Glucose, Bld 108 (H) 70 - 99 mg/dL    Comment: Glucose reference range applies only to samples taken after fasting for at least 8 hours.   BUN 16 8 - 23 mg/dL   Creatinine, Ser 1.27 (H) 0.61 - 1.24 mg/dL   Calcium 8.5 (L) 8.9 - 10.3 mg/dL   Total Protein 6.9 6.5 - 8.1 g/dL   Albumin 3.5 3.5 - 5.0 g/dL   AST 14 (L) 15 - 41 U/L   ALT 8 0 - 44 U/L   Alkaline Phosphatase 58 38 - 126 U/L   Total Bilirubin 1.0 0.3 - 1.2 mg/dL   GFR, Estimated >60 >60 mL/min    Comment: (NOTE) Calculated using the CKD-EPI Creatinine Equation (2021)    Anion gap 5 5 - 15    Comment: Performed at [facility], [place] [address] [place],  [address]  Resp Panel by RT-PCR (Flu A&B, Covid)  Anterior Nasal Swab     Status: None   Collection Time: 02/03/22  1:15 PM   Specimen: Anterior Nasal Swab  Result Value Ref Range   SARS Coronavirus 2 by RT PCR NEGATIVE NEGATIVE    Comment: (NOTE) SARS-CoV-2 target nucleic acids are NOT DETECTED.  The SARS-CoV-2 RNA is generally detectable in upper respiratory specimens during the acute phase of infection. The lowest concentration of SARS-CoV-2 viral copies this assay can detect is 138 copies/mL. A negative result does not preclude SARS-Cov-2 infection and should not be used as the sole basis for treatment or other patient management decisions. A negative result may occur with  improper specimen collection/handling, submission of specimen other than nasopharyngeal swab, presence of viral mutation(s) within the areas targeted by this assay, and inadequate number of viral copies(<138 copies/mL). A negative result must be combined with clinical observations, patient history, and epidemiological information. The expected result is Negative.  Fact Sheet for Patients:  EntrepreneurPulse.com.au  Fact Sheet for Healthcare Providers:  IncredibleEmployment.be  This test is no t yet approved or cleared by the [place] FDA and  has been authorized for detection and/or diagnosis of SARS-CoV-2 by FDA under an Emergency Use Authorization (EUA). This EUA will remain  in effect (meaning this test can be used) for the duration of the COVID-19 declaration under Section 564(b)(1) of the Act, 21 U.S.C.section 360bbb-3(b)(1), unless the authorization is terminated  or revoked sooner.       Influenza A by PCR NEGATIVE NEGATIVE   Influenza B by PCR NEGATIVE NEGATIVE    Comment: (NOTE) The Xpert Xpress SARS-CoV-2/FLU/RSV plus assay is intended as an aid in the diagnosis of influenza from Nasopharyngeal swab specimens and should not be used as a sole basis for treatment. Nasal washings and aspirates are  unacceptable for Xpert Xpress SARS-CoV-2/FLU/RSV testing.  Fact Sheet for Patients: EntrepreneurPulse.com.au  Fact Sheet for Healthcare Providers: IncredibleEmployment.be  This test is not yet approved or cleared by the [place] FDA and has been authorized for detection and/or diagnosis of SARS-CoV-2 by FDA under an Emergency Use Authorization (EUA). This EUA will remain in effect (meaning this test can be used) for the duration of the COVID-19 declaration under Section 564(b)(1) of the Act, 21 U.S.C. section 360bbb-3(b)(1), unless the authorization is terminated or revoked.  Performed at [facility], [place] [address], [place], [place] [address]   Troponin I (High Sensitivity)     Status: Abnormal   Collection Time: 02/03/22  8:23 PM  Result Value Ref Range   Troponin I (High Sensitivity) 21 (H) <18 ng/L    Comment: (NOTE) Elevated high sensitivity troponin I (hsTnI) values and significant  changes across serial measurements may suggest ACS but many other  chronic and acute conditions are known to elevate hsTnI results.  Refer to the "Links" section for chest pain algorithms and additional  guidance. Performed at [facility], [place] [address] [place], [place] [address]   D-dimer, quantitative     Status: Abnormal   Collection Time: 02/03/22  8:23 PM  Result Value Ref Range   D-Dimer, Quant 1.04 (H) 0.00 - 0.50 ug/mL-FEU    Comment: (NOTE) At the manufacturer cut-off value of 0.5 g/mL FEU, this assay has a negative predictive value of 95-100%.This assay is intended for use in conjunction with a clinical pretest probability (PTP) assessment model to exclude pulmonary embolism (PE) and deep venous thrombosis (DVT) in outpatients suspected of PE or DVT. Results should be correlated with clinical presentation. Performed at [facility], [place] [address], [place], [place] [address]   Troponin I (High Sensitivity)      Status: Abnormal   Collection Time: 02/03/22  8:36 PM  Result Value Ref Range   Troponin I (High Sensitivity) 19 (H) <18 ng/L  Comment: (NOTE) Elevated high sensitivity troponin I (hsTnI) values and significant  changes across serial measurements may suggest ACS but many other  chronic and acute conditions are known to elevate hsTnI results.  Refer to the "Links" section for chest pain algorithms and additional  guidance. Performed at [facility], [place] [address], [place], [place] [address]   HIV Antibody (routine testing w rflx)     Status: None   Collection Time: 02/04/22  5:36 AM  Result Value Ref Range   HIV Screen 4th Generation wRfx Non Reactive Non Reactive    Comment: Performed at [facility], [place] [address], [place], [place] [address]  CBC     Status: Abnormal   Collection Time: 02/04/22  5:36 AM  Result Value Ref Range   WBC 5.7 4.0 - 10.5 K/uL   RBC 3.02 (L) 4.22 - 5.81 MIL/uL   Hemoglobin 10.1 (L) 13.0 - 17.0 g/dL   HCT 33.4 (L) 39.0 - 52.0 %   MCV 110.6 (H) 80.0 - 100.0 fL   MCH 33.4 26.0 - 34.0 pg   MCHC 30.2 30.0 - 36.0 g/dL   RDW 16.4 (H) 11.5 - 15.5 %   Platelets 548 (H) 150 - 400 K/uL   nRBC 0.5 (H) 0.0 - 0.2 %    Comment: Performed at [facility] [address] [place], [place] [address]  Hemoglobin A1c     Status: None   Collection Time: 02/04/22  5:37 AM  Result Value Ref Range   Hgb A1c MFr Bld 5.4 4.8 - 5.6 %    Comment: (NOTE) Pre diabetes:          5.7%-6.4%  Diabetes:              >6.4%  Glycemic control for   <7.0% adults with diabetes    Mean Plasma Glucose 108.28 mg/dL    Comment: Performed at [facility] [address], [place], [place] [address]  CBG monitoring, ED     Status: None   Collection Time: 02/04/22  7:42 AM  Result Value Ref Range   Glucose-Capillary 96 70 - 99 mg/dL    Comment: Glucose reference range applies only to samples taken after fasting for at least 8 hours.  C-reactive protein      Status: Abnormal   Collection Time: 02/04/22  9:40 AM  Result Value Ref Range   CRP 7.9 (H) <1.0 mg/dL    Comment: Performed at [facility] [address], [place], [place] [address]  Magnesium     Status: None   Collection Time: 02/04/22  9:40 AM  Result Value Ref Range   Magnesium 2.0 1.7 - 2.4 mg/dL    Comment: Performed at [facility] [address], [place], [place] [address]   VAS [place] LOWER EXTREMITY VENOUS (DVT) (7a-7p)  Result Date: 02/04/2022  Lower Venous DVT Study Patient Name:  WINTON OFFORD  Date of Exam:   02/03/2022 Medical Rec #: [id number]         Accession #:    [id number] Date of Birth: May 13, 1950          Patient Gender: M Patient Age:   61 years Exam Location:  [facility] Procedure:      VAS [place] LOWER EXTREMITY VENOUS (DVT) Referring Phys: Ova [place] FONDAW --------------------------------------------------------------------------------  Indications: Swelling.  Risk Factors: None identified. Comparison Study: No prior studies. Performing Technologist: Oliver Hum RVT  Examination Guidelines: A complete  evaluation includes B-mode imaging, spectral Doppler, color Doppler, and power Doppler as needed of all accessible portions of each vessel. Bilateral testing is considered an integral part of a complete examination. Limited examinations for reoccurring indications may be performed as noted. The reflux portion of the exam is performed with the patient in reverse Trendelenburg.  +-----+---------------+---------+-----------+----------+--------------+ RIGHTCompressibilityPhasicitySpontaneityPropertiesThrombus Aging +-----+---------------+---------+-----------+----------+--------------+ CFV  Full           Yes      Yes                                 +-----+---------------+---------+-----------+----------+--------------+   +---------+---------------+---------+-----------+----------+--------------+ LEFT      CompressibilityPhasicitySpontaneityPropertiesThrombus Aging +---------+---------------+---------+-----------+----------+--------------+ CFV      Full           Yes      Yes                                 +---------+---------------+---------+-----------+----------+--------------+ SFJ      Full                                                        +---------+---------------+---------+-----------+----------+--------------+ FV Prox  Full                                                        +---------+---------------+---------+-----------+----------+--------------+ FV Mid   Full                                                        +---------+---------------+---------+-----------+----------+--------------+ FV DistalFull                                                        +---------+---------------+---------+-----------+----------+--------------+ PFV      Full                                                        +---------+---------------+---------+-----------+----------+--------------+ POP      Full           Yes      Yes                                 +---------+---------------+---------+-----------+----------+--------------+ PTV      Full                                                        +---------+---------------+---------+-----------+----------+--------------+  PERO     Full                                                        +---------+---------------+---------+-----------+----------+--------------+    Summary: RIGHT: - No evidence of common femoral vein obstruction.  LEFT: - There is no evidence of deep vein thrombosis in the lower extremity.  - No cystic structure found in the popliteal fossa.  *See table(s) above for measurements and observations. Electronically signed by Deitra Mayo MD on 02/04/2022 at 6:14:44 AM.    Final    CT Angio Chest PE W/Cm &/Or Wo Cm  Result Date: 02/03/2022 CLINICAL DATA:  Cough and  shortness of breath for several weeks EXAM: CT ANGIOGRAPHY CHEST WITH CONTRAST TECHNIQUE: Multidetector CT imaging of the chest was performed using the standard protocol during bolus administration of intravenous contrast. Multiplanar CT image reconstructions and MIPs were obtained to evaluate the vascular anatomy. RADIATION DOSE REDUCTION: This exam was performed according to the departmental dose-optimization program which includes automated exposure control, adjustment of the mA and/or kV according to patient size and/or use of iterative reconstruction technique. CONTRAST:  70m OMNIPAQUE IOHEXOL 350 MG/ML SOLN COMPARISON:  Chest x-ray from earlier in the same day, CT from 01/05/2022. FINDINGS: Cardiovascular: Atherosclerotic calcifications of the aorta are and its branches are seen although the degree of enhancement is minimal. No cardiac enlargement is noted. Pacing device is again seen. Pulmonary artery shows a normal branching pattern. No filling defect to suggest pulmonary embolism is noted. Scattered coronary calcifications are noted as well. Mediastinum/Nodes: Thoracic inlet is within normal limits. Right paratracheal and scattered hilar nodes are again seen stable from the prior exam felt to be reactive in nature. The esophagus as visualized is within normal limits. Lungs/Pleura: Small effusions are noted bilaterally. These of increased somewhat in the interval from the prior exam right greater than left similar to the prior study. Lungs demonstrates some patchy airspace opacities are noted consistent with multifocal pneumonia. These have increased from the prior exam. Upper Abdomen: Visualized upper abdomen is within normal limits. Musculoskeletal: Degenerative changes of the thoracic spine are noted. No acute rib abnormality is seen. Review of the MIP images confirms the above findings. IMPRESSION: No evidence of pulmonary emboli. Slight increase in pleural effusions when compared with the prior exam  right greater than left. New patchy airspace opacities likely representing early parenchymal infiltrates. Scattered stable lymph nodes in the hila and mediastinum again likely reactive in nature. Aortic Atherosclerosis (ICD10-I70.0). Electronically Signed   By: MInez CatalinaM.D.   On: 02/03/2022 23:05   DG Chest 2 View  Result Date: 02/03/2022 CLINICAL DATA:  Dyspnea EXAM: CHEST - 2 VIEW COMPARISON:  12/12/2021 chest radiograph. FINDINGS: Three lead left subclavian pacemaker is stable in configuration. Stable cardiomediastinal silhouette with normal heart size. No pneumothorax. Trace bilateral pleural effusions. Mild diffuse prominence of the parahilar interstitial markings. Streaky mild bibasilar atelectasis. IMPRESSION: 1. Mild diffuse prominence of the parahilar interstitial markings, favor mild pulmonary edema. 2. Trace bilateral pleural effusions. 3. Streaky mild bibasilar atelectasis. Electronically Signed   By: JIlona SorrelM.D.   On: 02/03/2022 13:50    Pending Labs Unresulted Labs (From admission, onward)     Start     Ordered   02/05/22 0500  Magnesium  Daily at 5am,  R     Question:  Specimen collection method  Answer:  Lab=Lab collect   02/04/22 0637   02/05/22 0500  C-reactive protein  Daily at 5am,   R     Question:  Specimen collection method  Answer:  Lab=Lab collect   02/04/22 0637   02/05/22 0500  Comprehensive metabolic panel  Daily at 5am,   R     Question:  Specimen collection method  Answer:  Lab=Lab collect   02/04/22 0637   02/05/22 0500  CBC with Differential/Platelet  Daily at 5am,   R     Question:  Specimen collection method  Answer:  Lab=Lab collect   02/04/22 0637   02/05/22 0500  Brain natriuretic peptide  Daily at 5am,   R     Question:  Specimen collection method  Answer:  Lab=Lab collect   02/04/22 0637   02/05/22 0500  Procalcitonin  Daily at 5am,   R      02/04/22 0638   02/04/22 0819  Procalcitonin - Baseline  Add-on,   AD        02/04/22 0818             Vitals/Pain Today's Vitals   02/04/22 0800 02/04/22 0808 02/04/22 0900 02/04/22 1015  BP: 118/61  97/85 112/68  Pulse: 90 87 87 73  Resp: 18 17 17 19   Temp:      TempSrc:      SpO2: 96% 97% 97% 96%  PainSc:        Isolation Precautions No active isolations  Medications Medications  insulin glargine-yfgn (SEMGLEE) injection 20 Units (20 Units Subcutaneous Given 02/04/22 0937)  spironolactone (ALDACTONE) tablet 12.5 mg (12.5 mg Oral Given 02/04/22 1008)  rosuvastatin (CRESTOR) tablet 5 mg (5 mg Oral Given 02/04/22 0913)  tamsulosin (FLOMAX) capsule 0.4 mg (0.4 mg Oral Given 02/04/22 0913)  magnesium oxide (MAG-OX) tablet 800 mg (800 mg Oral Given 02/04/22 0913)  ipratropium-albuterol (DUONEB) 0.5-2.5 (3) MG/3ML nebulizer solution 3 mL (3 mLs Nebulization Given 02/04/22 0825)  hydroxyurea (HYDREA) capsule 1,000 mg (1,000 mg Oral Given 02/04/22 1009)  colestipol (COLESTID) tablet 1 g (1 g Oral Not Given 02/04/22 1002)  dicyclomine (BENTYL) tablet 20 mg (20 mg Oral Given 02/04/22 0915)  budesonide (PULMICORT) nebulizer solution 0.5 mg (0.5 mg Nebulization Given 02/04/22 0851)  allopurinol (ZYLOPRIM) tablet 300 mg (300 mg Oral Given 02/04/22 0917)  sodium chloride flush (NS) 0.9 % injection 3 mL (3 mLs Intravenous Given 02/04/22 0919)  sodium chloride flush (NS) 0.9 % injection 3 mL (has no administration in time range)  0.9 %  sodium chloride infusion (has no administration in time range)  acetaminophen (TYLENOL) tablet 650 mg (has no administration in time range)  ondansetron (ZOFRAN) injection 4 mg (has no administration in time range)  enoxaparin (LOVENOX) injection 40 mg (40 mg Subcutaneous Given 02/04/22 0927)  furosemide (LASIX) injection 40 mg (40 mg Intravenous Given 02/04/22 0927)  carvedilol (COREG) tablet 3.125 mg (has no administration in time range)  losartan (COZAAR) tablet 12.5 mg (has no administration in time range)  folic acid (FOLVITE) tablet 1 mg (1 mg Oral Given  02/04/22 0914)  pantoprazole (PROTONIX) EC tablet 40 mg (40 mg Oral Given 02/04/22 0914)  oxybutynin (DITROPAN-XL) 24 hr tablet 10 mg (10 mg Oral Given 02/04/22 1009)  furosemide (LASIX) injection 40 mg (40 mg Intravenous Given 02/03/22 1928)  iohexol (OMNIPAQUE) 350 MG/ML injection 75 mL (75 mLs Intravenous Contrast Given 02/03/22 2257)    Mobility walks  with device Low fall risk   Focused Assessments Pulmonary Assessment Handoff:  Lung sounds:   O2 Device: Nasal Cannula O2 Flow Rate (L/min): 3 L/min    R Recommendations: See Admitting Provider Note

## 2022-02-04 NOTE — Plan of Care (Signed)
?  Problem: Education: ?Goal: Ability to demonstrate management of disease process will improve ?Outcome: Progressing ?  ?

## 2022-02-04 NOTE — H&P (Signed)
History and Physical    Patient: William Villanueva OFB:[id number] DOB: 03/01/1951 DOA: 02/03/2022 DOS: the patient was seen and examined on 02/04/2022 PCP: Antony Contras, MD  Patient coming from: Home  Chief Complaint: Dyspnea HPI: William Villanueva is a 71 y.o. 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  Pt presents to ED with c/o dyspnea.  Notes DOE and orthopnea are present.  Has had some weight gain preceding this recently with increased peripheral edema.  Doubled lasix for past several days which caused some weight loss but dyspnea persists despite that.  Cough with no hemoptysis.  Not on AC.  Seen at [facility]: found to be hypoxic and sent in to ED.  No h/o hypoxia, not on chronic O2.   Review of Systems: As mentioned in the history of present illness. All other systems reviewed and are negative. Past Medical History:  Diagnosis Date   AV block, 2nd degree 2015   St. Jude Allure Quadra pulse generator [id number], model PM 3242   Back pain    Cardiomyopathy (HCC)    Nonischemic 45%.    CHF (congestive heart failure) ([place])    Gout    Hemochromatosis    Hypertension    Hypospadias [birth year]   born with   Nephrolithiasis    Polycythemia vera(238.4)    Past Surgical History:  Procedure Laterality Date   ANKLE SURGERY     BI-VENTRICULAR PACEMAKER INSERTION N/A 04/29/2014   Procedure: BI-VENTRICULAR PACEMAKER INSERTION (CRT-P);  Surgeon: Deboraha Sprang, MD; Laterality: Left  St. Jude Allure Quadra pulse generator [id number], model [place] [id number]   CARDIAC SURGERY     COLONOSCOPY N/A 02/15/2020   Procedure: COLONOSCOPY;  Surgeon: Ronnette Juniper, MD;  Location: [place];  Service: Gastroenterology;  Laterality: N/A;   ESOPHAGOGASTRODUODENOSCOPY (EGD) WITH PROPOFOL N/A 02/15/2020   Procedure: ESOPHAGOGASTRODUODENOSCOPY (EGD) WITH PROPOFOL;  Surgeon: Ronnette Juniper, MD;  Location: [place];  Service: Gastroenterology;   Laterality: N/A;   PILONIDAL CYST EXCISION     POSTERIOR LAMINECTOMY / DECOMPRESSION LUMBAR SPINE     TONSILLECTOMY     Social History:  reports that he quit smoking about 8 years ago. His smoking use included cigarettes. He started smoking about 53 years ago. He has a 44.00 pack-year smoking history. He has never used smokeless tobacco. He reports current alcohol use. He reports that he does not use drugs.  Allergies  Allergen Reactions   Bupropion Hives and Other (See Comments)   Cephalexin Diarrhea and Other (See Comments)    Caused C-diff, also    Fluoxetine Itching   Ibuprofen Itching   Prednisone Itching and Other (See Comments)    Abdominal pain, also    Temazepam Other (See Comments)    Dizziness    Trazodone And Nefazodone Other (See Comments)    Dizziness   Fluoxetine Hcl Itching   Prozac [Fluoxetine Hcl] Itching    Family History  Problem Relation Age of Onset   Heart disease Maternal Grandmother        Pacemaker, [place]   Stroke Mother    Other Father        Deceased, car fell on him   Diabetes Sister    Hypertension Sister     Prior to Admission medications   Medication Sig Start Date End Date Taking? Authorizing Provider  allopurinol (ZYLOPRIM) 300 MG tablet Take 300 mg by mouth daily. 06/30/19  Yes [provider]  budesonide (PULMICORT) 0.5 MG/2ML  nebulizer solution Inhale one vial in nebulizer twice a day. **Rinse mouth after use. 12/05/21  Yes Margaretha Seeds, MD  carvedilol (COREG) 3.125 MG tablet Take 1 tablet (3.125 mg total) by mouth 2 (two) times daily with a meal. 08/19/21  Yes Deboraha Sprang, MD  colestipol (COLESTID) 1 g tablet Take 1 tablet by mouth 2 (two) times daily. 04/24/20  Yes [provider]  dicyclomine (BENTYL) 20 MG tablet Take 20 mg by mouth 2 (two) times daily. 03/15/20  Yes [provider]  furosemide (LASIX) 40 MG tablet Take 1/2 tab in the am and lunch Saturday through Wednesday, 1 tab in the morning and 1/2  tab at lunch on Thursday and Friday Patient taking differently: 40 mg daily. 06/02/21  Yes Duke, Tami Lin, PA  hydroxyurea (HYDREA) 500 MG capsule Take 2 capsules (1,000 mg total) by mouth daily. May take with food to minimize GI side effects. Currently taking 500 mg daily but MD note says may need to increase on 12/27/2020 to twice daily. 12/17/20  Yes Ennever, Rudell Cobb, MD  insulin degludec (TRESIBA FLEXTOUCH) 100 UNIT/ML SOPN FlexTouch Pen Inject 20 Units into the skin daily after breakfast.    Yes [provider]  ipratropium-albuterol (DUONEB) 0.5-2.5 (3) MG/3ML SOLN Inhale one vial in nebulizer 3 times a day and every 6 hrs as needed. Patient taking differently: Take 3 mLs by nebulization 2 (two) times daily. 12/02/21  Yes Margaretha Seeds, MD  losartan (COZAAR) 25 MG tablet Take 0.5 tablets (12.5 mg total) by mouth daily. 11/21/21  Yes Deboraha Sprang, MD  Magnesium Oxide 400 MG CAPS Take 2 capsules (800 mg total) by mouth in the morning and at bedtime. 04/26/20  Yes Deboraha Sprang, MD  omeprazole (PRILOSEC) 20 MG capsule Take 20 mg by mouth 2 (two) times daily before a meal.  07/05/16  Yes [provider]  rosuvastatin (CRESTOR) 5 MG tablet Take 5 mg by mouth daily.   Yes [provider]  spironolactone (ALDACTONE) 25 MG tablet TAKE ONE-HALF TABLET BY  MOUTH DAILY 07/04/21  Yes Lelon Perla, MD  tamsulosin (FLOMAX) 0.4 MG CAPS capsule Take 0.4 mg by mouth 2 (two) times daily.  08/03/17  Yes [provider]  BD PEN NEEDLE NANO U/F 32G X 4 MM MISC  06/16/18   [provider]  folic acid (FOLVITE) 1 MG tablet Take 1 mg by mouth daily.    [provider]  oxybutynin (DITROPAN-XL) 10 MG 24 hr tablet Take 10 mg by mouth daily. 10/02/19   [provider]    Physical Exam: Vitals:   02/04/22 0030 02/04/22 0045 02/04/22 0100 02/04/22 0130  BP: (!) 107/44 109/60 107/61 (!) 103/59  Pulse: 95 86 89 82  Resp: (!) 25 (!) 24 20 (!) 21   Temp:      TempSrc:      SpO2: 95% 92% 94% 98%   Constitutional: NAD, calm, comfortable Eyes: PERRL, lids and conjunctivae normal ENMT: Mucous membranes are moist. Posterior pharynx clear of any exudate or lesions.Normal dentition.  Neck: normal, supple, no masses, no thyromegaly Respiratory: Crackles at lung bases, no respiratory distress Cardiovascular: Regular rate and rhythm, no murmurs / rubs / gallops. L>R LE edema, 2+ pedal pulses. No carotid bruits.  Abdomen: no tenderness, no masses palpated. No hepatosplenomegaly. Bowel sounds positive.  Musculoskeletal: no clubbing / cyanosis. No joint deformity upper and lower extremities. Good ROM, no contractures. Normal muscle tone.  Skin: no rashes,  lesions, ulcers. No induration Neurologic: CN 2-12 grossly intact. Sensation intact, DTR normal. Strength 5/5 in all 4.  Psychiatric: Normal judgment and insight. Alert and oriented x 3. Normal mood.   Data Reviewed:    CTA chest:   IMPRESSION: No evidence of pulmonary emboli.   Slight increase in pleural effusions when compared with the prior exam right greater than left.   New patchy airspace opacities likely representing early parenchymal infiltrates.   Scattered stable lymph nodes in the hila and mediastinum again likely reactive in nature.      Latest Ref Rng & Units 02/03/2022    1:14 PM 01/26/2022    8:28 AM 12/12/2021    4:12 PM  CMP  Glucose 70 - 99 mg/dL 108  113  105   BUN 8 - 23 mg/dL 16  18  13    Creatinine 0.61 - 1.24 mg/dL 1.27  1.15  1.27   Sodium 135 - 145 mmol/L 139  140  142   Potassium 3.5 - 5.1 mmol/L 4.0  4.3  4.6   Chloride 98 - 111 mmol/L 110  105  105   CO2 22 - 32 mmol/L 24  20  26    Calcium 8.9 - 10.3 mg/dL 8.5  8.7  8.5   Total Protein 6.5 - 8.1 g/dL 6.9     Total Bilirubin 0.3 - 1.2 mg/dL 1.0     Alkaline Phos 38 - 126 U/L 58     AST 15 - 41 U/L 14     ALT 0 - 44 U/L 8      Trops 21 and 19  BNP 634     Latest Ref Rng & Units 02/03/2022     1:14 PM 12/12/2021    4:12 PM 11/24/2021    8:01 AM  CBC  WBC 4.0 - 10.5 K/uL 7.0  7.7  6.3   Hemoglobin 13.0 - 17.0 g/dL 10.2  11.9  11.7   Hematocrit 39.0 - 52.0 % 34.2  36.2  34.9   Platelets 150 - 400 K/uL 512  606.0  406     COVID and flu neg.  Assessment and Plan: * Acute on chronic combined systolic (congestive) and diastolic (congestive) heart failure (HCC) CHF pathway Lasix 40m IV daily Tele monitor 2d echo BMP daily with diuresis Cont home aldactone, losartan, and coreg  DM2 (diabetes mellitus, type 2) (HCC) Continue home [place] and [place] CBG checks AC/HS Check A1C  Essential hypertension Cont home coreg, aldactone, losartan  Polycythemia vera (HCC) Continue hydrea HGB 10.2 down from 11 previously.  Keep eye on this during stay.      Advance Care Planning:   Code Status: Full Code  Consults: None  Family Communication: No family in room  Severity of Illness: The appropriate patient status for this patient is OBSERVATION. Observation status is judged to be reasonable and necessary in order to provide the required intensity of service to ensure the patient's safety. The patient's presenting symptoms, physical exam findings, and initial radiographic and laboratory data in the context of their medical condition is felt to place them at decreased risk for further clinical deterioration. Furthermore, it is anticipated that the patient will be medically stable for discharge from the hospital within 2 midnights of admission.   Author: GEtta Quill, DO 02/04/2022 1:51 AM  For on call review [email or web address]

## 2022-02-04 NOTE — Assessment & Plan Note (Signed)
Continue with carvedilol, losartan and diuresis with furosemide and spironolactone.

## 2022-02-04 NOTE — Evaluation (Signed)
Physical Therapy Evaluation Patient Details Name: William Villanueva MRN: [id number] DOB: 10/26/1950 Today's Date: 02/04/2022  History of Present Illness  pt is a 71 y/o male admitted 9/29 to the ED with c/o dyspnea.  Pt notes DOE and orthopnea, recent weight gait with peripheral edema.  Furthermore has had cough with hemoptysis.  Working dx of acute on chronic combined systolic/diastolic HF.  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  Clinical Impression  Pt admitted with/for SOB, swelling.  Pt is mobile, but not quite at baseline with dyspnea and lower normal SpO2 on 4L [place].  Overall at mod I to supervision level  Will stay on caseload in case of admission, but otherwise no PT follow up..  Pt currently limited functionally due to the problems listed below.  (see problems list.)  Pt will benefit from PT to maximize function and safety to be able to get home safely with available assist .        Recommendations for follow up therapy are one component of a multi-disciplinary discharge planning process, led by the attending physician.  Recommendations may be updated based on patient status, additional functional criteria and insurance authorization.  Follow Up Recommendations No PT follow up      Assistance Recommended at Discharge Set up Supervision/Assistance  Patient can return home with the following       Equipment Recommendations None recommended by PT  Recommendations for Other Services       Functional Status Assessment Patient has had a recent decline in their functional status and demonstrates the ability to make significant improvements in function in a reasonable and predictable amount of time.     Precautions / Restrictions Precautions Precautions: Fall      Mobility  Bed Mobility Overal bed mobility: Needs Assistance             General bed mobility comments: EOB on arrival    Transfers Overall transfer level: Modified independent                       Ambulation/Gait Ambulation/Gait assistance: Supervision Gait Distance (Feet): 120 Feet Assistive device: None Gait Pattern/deviations: Step-through pattern Gait velocity: slower Gait velocity interpretation: 1.31 - 2.62 ft/sec, indicative of limited community ambulator   General Gait Details: generally steady, though with excessive L w/shift with some contralateral L hip drop, appears antalgic, but pt denies pain today.  Pt usually uses a cane which was not present.  He showed to be safe without AD in a homelike environment.  Stairs            Wheelchair Mobility    Modified Rankin (Stroke Patients Only)       Balance Overall balance assessment: Mild deficits observed, not formally tested                                           Pertinent Vitals/Pain Pain Assessment Pain Assessment: Faces Faces Pain Scale: No hurt Pain Intervention(s): Monitored during session    Home Living Family/patient expects to be discharged to:: Private residence Living Arrangements: Spouse/significant other Available Help at Discharge: Family;Available 24 hours/day Type of Home: House Home Access: Stairs to enter Entrance Stairs-Rails: Right Entrance Stairs-Number of Steps: 6   Home Layout: One level Home Equipment: Cane - single point      Prior Function Prior Level of  Function : Independent/Modified Independent;Working/employed;Driving             Mobility Comments: uses a cane most of the time 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       Hand Dominance        Extremity/Trunk Assessment   Upper Extremity Assessment Upper Extremity Assessment: Overall WFL for tasks assessed    Lower Extremity Assessment Lower Extremity Assessment: Overall WFL for tasks assessed       Communication   Communication: No difficulties  Cognition Arousal/Alertness: Awake/alert Behavior During Therapy: WFL for tasks  assessed/performed Overall Cognitive Status: Within Functional Limits for tasks assessed                                          General Comments General comments (skin integrity, edema, etc.): dyspneic on 4L, sats dropping to 90% furing gait with quick rebound once resting, otherwise VSS    Exercises     Assessment/Plan    PT Assessment Patient needs continued PT services  PT Problem List Decreased activity tolerance;Decreased mobility;Decreased knowledge of use of DME       PT Treatment Interventions Gait training;Functional mobility training;Therapeutic activities;Stair training;DME instruction;Patient/family education    PT Goals (Current goals can be found in the Care Plan section)  Acute Rehab PT Goals Patient Stated Goal: home and back to work PT Goal Formulation: With patient Time For Goal Achievement: 02/10/22 Potential to Achieve Goals: Good    Frequency Min 3X/week     Co-evaluation               AM-PAC PT "6 Clicks" Mobility  Outcome Measure Help needed turning from your back to your side while in a flat bed without using bedrails?: None Help needed moving from lying on your back to sitting on the side of a flat bed without using bedrails?: None Help needed moving to and from a bed to a chair (including a wheelchair)?: None Help needed standing up from a chair using your arms (e.g., wheelchair or bedside chair)?: None Help needed to walk in hospital room?: A Little Help needed climbing 3-5 steps with a railing? : A Little 6 Click Score: 22    End of Session Equipment Utilized During Treatment: Oxygen Activity Tolerance: Patient tolerated treatment well;Patient limited by fatigue Patient left: in bed;Other (comment);with call bell/phone within reach (sitting EOB) Nurse Communication: Mobility status PT Visit Diagnosis: Unsteadiness on feet (R26.81);Difficulty in walking, not elsewhere classified (R26.2)    Time: 0768-0881 PT Time  Calculation (min) (ACUTE ONLY): 25 min   Charges:   PT Evaluation $PT Eval Low Complexity: 1 Low PT Treatments $Gait Training: 8-22 mins        02/04/2022  Ginger Carne., PT Acute Rehabilitation Services [phone]  (office)  Tessie Fass Lataysha Vohra 02/04/2022, 1:00 PM

## 2022-02-04 NOTE — Assessment & Plan Note (Addendum)
Echocardiogram with reduced LV systolic function 14%, with global hypokinesis. RV systolic function preserved, RVSP 41,9 mmHg, mild RV enlarged cavity, LA with severe dilatation.   Patient was placed on IV furosemide, negative fluid balance was achieved with significant improvement in his symptoms.  Patient has lost 4 kg since admission  Continue medical therapy with carvedilol, losartan and spironolactone. Continue diuresis with furosemide 40 mg daily and instructions to increase to bid in case of weight gain, 2 to 3 lbs in 24 hrs or 5 lbs in 7 days.  Follow up as outpatient with cardiology.  Holding SGLT 2 ing for now.   Acute on chronic hypoxemic respiratory failure, dyspnea and volume status has improved, but patient continue to have 02 desaturation on ambulation.  Will add home 02 and will recommend follow up as outpatient.

## 2022-02-04 NOTE — Progress Notes (Signed)
  Echocardiogram 2D Echocardiogram has been performed.  William Villanueva F 02/04/2022, 1:20 PM

## 2022-02-04 NOTE — Assessment & Plan Note (Addendum)
Continue hydroxyurea.  Cell count has been stable.

## 2022-02-05 DIAGNOSIS — I10 Essential (primary) hypertension: Secondary | ICD-10-CM | POA: Diagnosis not present

## 2022-02-05 DIAGNOSIS — I5043 Acute on chronic combined systolic (congestive) and diastolic (congestive) heart failure: Secondary | ICD-10-CM | POA: Diagnosis not present

## 2022-02-05 DIAGNOSIS — D45 Polycythemia vera: Secondary | ICD-10-CM | POA: Diagnosis not present

## 2022-02-05 DIAGNOSIS — E119 Type 2 diabetes mellitus without complications: Secondary | ICD-10-CM | POA: Diagnosis not present

## 2022-02-05 DIAGNOSIS — I442 Atrioventricular block, complete: Secondary | ICD-10-CM

## 2022-02-05 LAB — CBC WITH DIFFERENTIAL/PLATELET
Abs Immature Granulocytes: 0.05 10*3/uL (ref 0.00–0.07)
Basophils Absolute: 0.1 10*3/uL (ref 0.0–0.1)
Basophils Relative: 2 %
Eosinophils Absolute: 0.1 10*3/uL (ref 0.0–0.5)
Eosinophils Relative: 1 %
HCT: 30.6 % — ABNORMAL LOW (ref 39.0–52.0)
Hemoglobin: 9.7 g/dL — ABNORMAL LOW (ref 13.0–17.0)
Immature Granulocytes: 1 %
Lymphocytes Relative: 13 %
Lymphs Abs: 0.7 10*3/uL (ref 0.7–4.0)
MCH: 34.2 pg — ABNORMAL HIGH (ref 26.0–34.0)
MCHC: 31.7 g/dL (ref 30.0–36.0)
MCV: 107.7 fL — ABNORMAL HIGH (ref 80.0–100.0)
Monocytes Absolute: 0.7 10*3/uL (ref 0.1–1.0)
Monocytes Relative: 13 %
Neutro Abs: 3.8 10*3/uL (ref 1.7–7.7)
Neutrophils Relative %: 70 %
Platelets: 518 10*3/uL — ABNORMAL HIGH (ref 150–400)
RBC: 2.84 MIL/uL — ABNORMAL LOW (ref 4.22–5.81)
RDW: 16.4 % — ABNORMAL HIGH (ref 11.5–15.5)
WBC: 5.4 10*3/uL (ref 4.0–10.5)
nRBC: 0.6 % — ABNORMAL HIGH (ref 0.0–0.2)

## 2022-02-05 LAB — CREATININE, URINE, RANDOM: Creatinine, Urine: 182 mg/dL

## 2022-02-05 LAB — GLUCOSE, CAPILLARY
Glucose-Capillary: 96 mg/dL (ref 70–99)
Glucose-Capillary: 111 mg/dL — ABNORMAL HIGH (ref 70–99)
Glucose-Capillary: 112 mg/dL — ABNORMAL HIGH (ref 70–99)
Glucose-Capillary: 110 mg/dL — ABNORMAL HIGH (ref 70–99)

## 2022-02-05 LAB — URINALYSIS, ROUTINE W REFLEX MICROSCOPIC
Bacteria, UA: NONE SEEN
Bilirubin Urine: NEGATIVE
Glucose, UA: NEGATIVE mg/dL
Hgb urine dipstick: NEGATIVE
Ketones, ur: NEGATIVE mg/dL
Leukocytes,Ua: NEGATIVE
Nitrite: NEGATIVE
Protein, ur: NEGATIVE mg/dL
Specific Gravity, Urine: 1.02 (ref 1.005–1.030)
pH: 5 (ref 5.0–8.0)

## 2022-02-05 LAB — COMPREHENSIVE METABOLIC PANEL
ALT: 7 U/L (ref 0–44)
AST: 14 U/L — ABNORMAL LOW (ref 15–41)
Albumin: 3.3 g/dL — ABNORMAL LOW (ref 3.5–5.0)
Alkaline Phosphatase: 58 U/L (ref 38–126)
Anion gap: 11 (ref 5–15)
BUN: 18 mg/dL (ref 8–23)
CO2: 26 mmol/L (ref 22–32)
Calcium: 8.6 mg/dL — ABNORMAL LOW (ref 8.9–10.3)
Chloride: 102 mmol/L (ref 98–111)
Creatinine, Ser: 1.54 mg/dL — ABNORMAL HIGH (ref 0.61–1.24)
GFR, Estimated: 48 mL/min — ABNORMAL LOW (ref >60)
Glucose, Bld: 109 mg/dL — ABNORMAL HIGH (ref 70–99)
Potassium: 4 mmol/L (ref 3.5–5.1)
Sodium: 139 mmol/L (ref 135–145)
Total Bilirubin: 1 mg/dL (ref 0.3–1.2)
Total Protein: 6.6 g/dL (ref 6.5–8.1)

## 2022-02-05 LAB — SODIUM, URINE, RANDOM: Sodium, Ur: 64 mmol/L

## 2022-02-05 LAB — MAGNESIUM: Magnesium: 1.8 mg/dL (ref 1.7–2.4)

## 2022-02-05 LAB — OSMOLALITY, URINE: Osmolality, Ur: 536 mOsm/kg (ref 300–900)

## 2022-02-05 LAB — C-REACTIVE PROTEIN: CRP: 6.9 mg/dL — ABNORMAL HIGH (ref <1.0)

## 2022-02-05 LAB — URIC ACID: Uric Acid, Serum: 11.5 mg/dL — ABNORMAL HIGH (ref 3.7–8.6)

## 2022-02-05 LAB — OSMOLALITY: Osmolality: 289 mOsm/kg (ref 275–295)

## 2022-02-05 LAB — PROCALCITONIN: Procalcitonin: <0.1 ng/mL

## 2022-02-05 LAB — BRAIN NATRIURETIC PEPTIDE: B Natriuretic Peptide: 475.2 pg/mL — ABNORMAL HIGH (ref 0.0–100.0)

## 2022-02-05 MED ORDER — MIDODRINE HCL 5 MG PO TABS: 5.0000 mg | ORAL_TABLET | Freq: Three times a day (TID) | ORAL | Status: DC | PRN

## 2022-02-05 MED ORDER — ZOLPIDEM TARTRATE 5 MG PO TABS
5.0000 mg | ORAL_TABLET | Freq: Every evening | ORAL | Status: DC | PRN
  Administered 2022-02-06 – 2022-02-09 (×5): 5 mg via ORAL
  Filled 2022-02-05 (×5): qty 1

## 2022-02-05 MED ORDER — MAGNESIUM SULFATE IN D5W 1-5 GM/100ML-% IV SOLN
1.0000 g | Freq: Once | INTRAVENOUS | Status: AC
  Administered 2022-02-05: 1 g via INTRAVENOUS
  Filled 2022-02-05: qty 100

## 2022-02-05 MED ORDER — FUROSEMIDE 10 MG/ML IJ SOLN
40.0000 mg | Freq: Two times a day (BID) | INTRAMUSCULAR | Status: DC
  Administered 2022-02-05 – 2022-02-07 (×5): 40 mg via INTRAVENOUS
  Filled 2022-02-05 (×6): qty 4

## 2022-02-05 MED ORDER — BENZONATATE 100 MG PO CAPS
200.0000 mg | ORAL_CAPSULE | Freq: Three times a day (TID) | ORAL | Status: DC | PRN
  Administered 2022-02-05 – 2022-02-10 (×7): 200 mg via ORAL
  Filled 2022-02-05 (×7): qty 2

## 2022-02-05 MED ORDER — VITAMIN B-12 1000 MCG PO TABS
1000.0000 ug | ORAL_TABLET | Freq: Every day | ORAL | Status: DC
  Administered 2022-02-05 – 2022-02-10 (×6): 1000 ug via ORAL
  Filled 2022-02-05 (×6): qty 1

## 2022-02-05 MED ORDER — FUROSEMIDE 10 MG/ML IJ SOLN
40.0000 mg | Freq: Once | INTRAMUSCULAR | Status: AC
  Administered 2022-02-05: 40 mg via INTRAVENOUS
  Filled 2022-02-05: qty 4

## 2022-02-05 MED ORDER — ORAL CARE MOUTH RINSE: 15.0000 mL | OROMUCOSAL | Status: DC | PRN

## 2022-02-05 MED ORDER — CARVEDILOL 3.125 MG PO TABS
3.1250 mg | ORAL_TABLET | Freq: Two times a day (BID) | ORAL | Status: DC
  Administered 2022-02-06 – 2022-02-10 (×9): 3.125 mg via ORAL
  Filled 2022-02-05 (×9): qty 1

## 2022-02-05 MED ORDER — GUAIFENESIN-DM 100-10 MG/5ML PO SYRP
5.0000 mL | ORAL_SOLUTION | ORAL | Status: DC | PRN
  Administered 2022-02-05 – 2022-02-08 (×6): 5 mL via ORAL
  Filled 2022-02-05 (×7): qty 5

## 2022-02-05 NOTE — Plan of Care (Signed)
  Problem: Education: Goal: Knowledge of General Education information will improve Description Including pain rating scale, medication(s)/side effects and non-pharmacologic comfort measures Outcome: Progressing   

## 2022-02-05 NOTE — Progress Notes (Signed)
PROGRESS NOTE                                                                                                                                                                                                             Patient Demographics:    William Villanueva, is a 71 y.o. male, DOB - Oct 01, 1950, DQQ:[id number]  Outpatient Primary MD for the patient is Antony Contras, MD    LOS - 1  Admit date - 02/03/2022    No chief complaint on file.      Brief Narrative (HPI from H&P)   71 y.o. 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  He presented to the hospital with progressive shortness of breath, unexplained weight gain along with orthopnea, he was diagnosed with acute on chronic CHF and admitted to the hospital for further care.    Subjective:   Patient in bed, appears comfortable, denies any headache, no fever, no chest pain or pressure, +ve shortness of breath , no abdominal pain. No new focal weakness.    Assessment  & Plan :   Acute on chronic combined systolic (congestive) and diastolic (congestive) heart failure (HCC) EF now 40% with global hypokinesis and enlarged LV cavity.  He has been started on IV Lasix for diuresis with good improvement, his shortness of breath has improved, his D-dimer was borderline however his CTA and lower extremity venous duplex are negative.    Continue diuresis as tolerated by his renal function and blood pressure, continue low-dose beta-blocker as tolerated by blood pressure, ARB held on 02/05/2022 due to AKI, repeat echo noted, EF has dropped, global hypokinesis with dilated LV cavity, cardiology consulted, of note his mild bump in troponin is likely due to demand mismatch from hypoxia, trend is flat and in non-ACS pattern.  Essential hypertension -  on diuretics and Coreg as tolerated by blood pressure, blood pressure on the softer  side, will monitor closely with developing AKI.  As needed midodrine added.  Polycythemia vera (HCC) - Continue hydrea, HGB 10.2 down from 11 previously.  Keep eye on this during stay.  BPH - on Flomax  Dyslipidemia.  On statin continue.  GERD.  Continue PPI.    AKI.  Still evidence of fluid overload, continue diuresis gently, hold ARB/ACE monitor blood pressure.  DM2 (diabetes mellitus, type 2) ([place]) - ISS  Lab Results  Component Value Date   HGBA1C 5.4 02/04/2022   CBG (last 3)  Recent Labs    02/04/22 1611 02/04/22 2050 02/05/22 0640  GLUCAP 92 108* 96        Condition - Fair  Family Communication  :  None  Code Status :  Full  Consults  :  Cards  PUD Prophylaxis : PPI   Procedures  :     TTE -   CTA -  No evidence of pulmonary emboli. Slight increase in pleural effusions when compared with the prior exam right greater than left. New patchy airspace opacities likely representing early parenchymal infiltrates. Scattered stable lymph nodes in the hila and mediastinum again likely reactive in nature. Aortic Atherosclerosis.  Leg [place] - Non DVT      Disposition Plan  :    Status is: Observation  DVT Prophylaxis  :    enoxaparin (LOVENOX) injection 40 mg Start: 02/04/22 1000 Place TED hose Start: 02/04/22 9528  Lab Results  Component Value Date   PLT 518 (H) 02/05/2022    Diet :  Diet Order             Diet heart healthy/carb modified Room service appropriate? Yes; Fluid consistency: Thin; Fluid restriction: 1500 mL Fluid  Diet effective now                    Inpatient Medications  Scheduled Meds:  allopurinol  300 mg Oral Daily   budesonide  0.5 mg Nebulization BID   [START ON 02/06/2022] carvedilol  3.125 mg Oral BID WC   colestipol  1 g Oral BID   cyanocobalamin  1,000 mcg Oral Daily   dicyclomine  20 mg Oral BID   enoxaparin (LOVENOX) injection  40 mg Subcutaneous Daily   folic acid  1 mg Oral Daily   furosemide  40 mg Intravenous  BID   hydroxyurea  1,000 mg Oral Daily   insulin glargine-yfgn  20 Units Subcutaneous Daily   ipratropium-albuterol  3 mL Nebulization BID   magnesium oxide  800 mg Oral BID   oxybutynin  10 mg Oral Daily   pantoprazole  40 mg Oral Daily   rosuvastatin  5 mg Oral Daily   tamsulosin  0.4 mg Oral BID   Continuous Infusions:  magnesium sulfate bolus IVPB     PRN Meds:.acetaminophen, benzonatate, guaiFENesin-dextromethorphan, ondansetron (ZOFRAN) IV, mouth rinse, zolpidem  Antibiotics  :    Anti-infectives (From admission, onward)    None        Time Spent in minutes  30   Lala Lund M.D on 02/05/2022 at 9:24 AM  To page go to [email or web address]   [facility] -  Office  [phone]  See all Orders from today for further details    Objective:   Vitals:   02/05/22 0341 02/05/22 0400 02/05/22 0745 02/05/22 0834  BP: 110/68  116/84   Pulse: 93  82   Resp: 17  17   Temp: 97.8 F (36.6 C)  97.8 F (36.6 C)   TempSrc: Oral  Oral   SpO2:  94% 96% 95%  Weight: 116.3 kg     Height:        Wt Readings from Last 3 Encounters:  02/05/22 116.3 kg  01/24/22 119.3 kg  12/12/21 120.2 kg     Intake/Output Summary (Last 24 hours) at 02/05/2022 0924 Last data filed at 02/05/2022 0910 Gross per  24 hour  Intake 720 ml  Output 675 ml  Net 45 ml     Physical Exam  Awake Alert, No new F.N deficits, Normal affect [place].AT,PERRAL Supple Neck, No JVD,   Symmetrical Chest wall movement, Good air movement bilaterally, +ve rales RRR,No Gallops, Rubs or new Murmurs,  +ve B.Sounds, Abd Soft, No tenderness,   No Cyanosis, 1+ edema      Data Review:    CBC Recent Labs  Lab 02/03/22 1314 02/04/22 0536 02/05/22 0259  WBC 7.0 5.7 5.4  HGB 10.2* 10.1* 9.7*  HCT 34.2* 33.4* 30.6*  PLT 512* 548* 518*  MCV 111.4* 110.6* 107.7*  MCH 33.2 33.4 34.2*  MCHC 29.8* 30.2 31.7  RDW 16.1* 16.4* 16.4*  LYMPHSABS 0.7  --  0.7  MONOABS 0.6  --  0.7  EOSABS 0.1  --  0.1   BASOSABS 0.1  --  0.1    Electrolytes Recent Labs  Lab 02/03/22 1314 02/03/22 2023 02/04/22 0537 02/04/22 0940 02/05/22 0259  NA 139  --   --   --  139  K 4.0  --   --   --  4.0  CL 110  --   --   --  102  CO2 24  --   --   --  26  GLUCOSE 108*  --   --   --  109*  BUN 16  --   --   --  18  CREATININE 1.27*  --   --   --  1.54*  CALCIUM 8.5*  --   --   --  8.6*  AST 14*  --   --   --  14*  ALT 8  --   --   --  7  ALKPHOS 58  --   --   --  58  BILITOT 1.0  --   --   --  1.0  ALBUMIN 3.5  --   --   --  3.3*  MG  --   --   --  2.0 1.8  CRP  --   --   --  7.9* 6.9*  DDIMER  --  1.04*  --   --   --   PROCALCITON  --   --   --  <0.10 <0.10  HGBA1C  --   --  5.4  --   --   BNP 634.0*  --   --   --  475.2*    ------------------------------------------------------------------------------------------------------------------ No results for input(s): "CHOL", "HDL", "LDLCALC", "TRIG", "CHOLHDL", "LDLDIRECT" in the last 72 hours.  Lab Results  Component Value Date   HGBA1C 5.4 02/04/2022    No results for input(s): "TSH", "T4TOTAL", "T3FREE", "THYROIDAB" in the last 72 hours.  Invalid input(s): "FREET3" ------------------------------------------------------------------------------------------------------------------ ID Labs Recent Labs  Lab 02/03/22 1314 02/03/22 2023 02/04/22 0536 02/04/22 0940 02/05/22 0259  WBC 7.0  --  5.7  --  5.4  PLT 512*  --  548*  --  518*  CRP  --   --   --  7.9* 6.9*  DDIMER  --  1.04*  --   --   --   PROCALCITON  --   --   --  <0.10 <0.10  CREATININE 1.27*  --   --   --  1.54*    Radiology Reports ECHOCARDIOGRAM COMPLETE  Result Date: 02/04/2022    ECHOCARDIOGRAM REPORT   Patient Name:   KRISTINA BERTONE Date of Exam: 02/04/2022 Medical Rec #:  [id number]        Height:       70.0 in Accession #:    [id number]       Weight:       263.0 lb Date of Birth:  04/03/1951         BSA:          2.345 m Patient Age:    29 years         BP:            112/68 mmHg Patient Gender: M                HR:           81 bpm. Exam Location:  Inpatient Procedure: 2D Echo, Cardiac Doppler, Color Doppler and Intracardiac            Opacification Agent Indications:    CHF  History:        Patient has prior history of Echocardiogram examinations, most                 recent 06/21/2020. Signs/Symptoms:Shortness of Breath and                 Dyspnea.  Sonographer:    Merrie Roof RDCS Referring Phys: [place]  1. Left ventricular ejection fraction, by estimation, is 40%. The left ventricle has moderately decreased function. The left ventricle demonstrates global hypokinesis. The left ventricular internal cavity size was severely dilated. Left ventricular diastolic parameters are consistent with Grade II diastolic dysfunction (pseudonormalization). Elevated left atrial pressure. The E/e' is 19.8.  2. Right ventricular systolic function is normal. The right ventricular size is mildly enlarged. There is mildly elevated pulmonary artery systolic pressure.  3. Left atrial size was severely dilated.  4. Right atrial size was mildly dilated.  5. The mitral valve is grossly normal. Mild mitral valve regurgitation.  6. The aortic valve is tricuspid. There is mild thickening of the aortic valve. Aortic valve regurgitation is not visualized. Aortic valve sclerosis is present, with no evidence of aortic valve stenosis.  7. The inferior vena cava is dilated in size with >50% respiratory variability, suggesting right atrial pressure of 8 mmHg. Comparison(s): Compared to prior TTE on 06/2020, the LV is now severely dilated with EF ~40% (previously moderately dilated with EF 45-50%). FINDINGS  Left Ventricle: Left ventricular ejection fraction, by estimation, is 40%. The left ventricle has moderately decreased function. The left ventricle demonstrates global hypokinesis. Definity contrast agent was given IV to delineate the left ventricular endocardial borders. The left  ventricular internal cavity size was severely dilated. There is no left ventricular hypertrophy. Left ventricular diastolic parameters are consistent with Grade II diastolic dysfunction (pseudonormalization). Elevated left atrial pressure. The E/e' is 19.8. Right Ventricle: The right ventricular size is mildly enlarged. No increase in right ventricular wall thickness. Right ventricular systolic function is normal. There is mildly elevated pulmonary artery systolic pressure. The tricuspid regurgitant velocity is 2.91 m/s, and with an assumed right atrial pressure of 8 mmHg, the estimated right ventricular systolic pressure is 70.1 mmHg. Left Atrium: Left atrial size was severely dilated. Right Atrium: Right atrial size was mildly dilated. Pericardium: There is no evidence of pericardial effusion. Mitral Valve: The mitral valve is grossly normal. There is mild thickening of the mitral valve leaflet(s). There is mild calcification of the mitral valve leaflet(s). Mild mitral valve regurgitation. Tricuspid Valve: The tricuspid valve is normal in structure. Tricuspid valve regurgitation  is trivial. Aortic Valve: The aortic valve is tricuspid. There is mild thickening of the aortic valve. Aortic valve regurgitation is not visualized. Aortic valve sclerosis is present, with no evidence of aortic valve stenosis. Aortic valve mean gradient measures 5.0  mmHg. Aortic valve peak gradient measures 8.5 mmHg. Aortic valve area, by VTI measures 1.32 cm. Pulmonic Valve: The pulmonic valve was normal in structure. Pulmonic valve regurgitation is not visualized. Aorta: The aortic root and ascending aorta are structurally normal, with no evidence of dilitation. Venous: The inferior vena cava is dilated in size with greater than 50% respiratory variability, suggesting right atrial pressure of 8 mmHg. IAS/Shunts: The atrial septum is grossly normal. Additional Comments: A device lead is visualized.  LEFT VENTRICLE PLAX 2D LVIDd:          6.90 cm      Diastology LVIDs:         5.70 cm      LV e' medial:    7.18 cm/s LV PW:         1.00 cm      LV E/e' medial:  15.3 LV IVS:        0.90 cm      LV e' lateral:   5.55 cm/s LVOT diam:     2.00 cm      LV E/e' lateral: 19.8 LV SV:         36 LV SV Index:   16 LVOT Area:     3.14 cm  LV Volumes (MOD) LV vol d, MOD A2C: 283.0 ml LV vol d, MOD A4C: 305.0 ml LV vol s, MOD A2C: 202.5 ml LV vol s, MOD A4C: 168.0 ml LV SV MOD A2C:     80.5 ml LV SV MOD A4C:     305.0 ml LV SV MOD BP:      115.5 ml RIGHT VENTRICLE             IVC RV Basal diam:  4.70 cm     IVC diam: 2.60 cm RV Mid diam:    3.70 cm RV S prime:     11.60 cm/s TAPSE (M-mode): 2.8 cm LEFT ATRIUM              Index        RIGHT ATRIUM           Index LA diam:        5.30 cm  2.26 cm/m   RA Area:     24.10 cm LA Vol (A2C):   92.7 ml  39.53 ml/m  RA Volume:   84.20 ml  35.91 ml/m LA Vol (A4C):   117.0 ml 49.89 ml/m LA Biplane Vol: 113.0 ml 48.19 ml/m  AORTIC VALVE AV Area (Vmax):    1.50 cm AV Area (Vmean):   1.38 cm AV Area (VTI):     1.32 cm AV Vmax:           146.00 cm/s AV Vmean:          98.500 cm/s AV VTI:            0.276 m AV Peak Grad:      8.5 mmHg AV Mean Grad:      5.0 mmHg LVOT Vmax:         69.50 cm/s LVOT Vmean:        43.200 cm/s LVOT VTI:          0.116 m LVOT/AV VTI ratio: 0.42  AORTA Ao Root  diam: 3.60 cm Ao Asc diam:  3.50 cm MITRAL VALVE                TRICUSPID VALVE MV Area (PHT): 5.38 cm     TR Peak grad:   33.9 mmHg MV Decel Time: 141 msec     TR Vmax:        291.00 cm/s MV E velocity: 110.00 cm/s MV A velocity: 78.50 cm/s   SHUNTS MV E/A ratio:  1.40         Systemic VTI:  0.12 m                             Systemic Diam: 2.00 cm Gwyndolyn Kaufman MD Electronically signed by Gwyndolyn Kaufman MD Signature Date/Time: 02/04/2022/1:39:17 PM    Final    VAS [place] LOWER EXTREMITY VENOUS (DVT) (7a-7p)  Result Date: 02/04/2022  Lower Venous DVT Study Patient Name:  GABRIEL CONRY  Date of Exam:   02/03/2022 Medical Rec #:  [id number]         Accession #:    [id number] Date of Birth: 1951-01-16          Patient Gender: M Patient Age:   75 years Exam Location[facility]  [facility] Procedure:      VAS [place] LOWER EXTREMITY VENOUS (DVT) Referring Phys: Ova Freshwater FONDAW --------------------------------------------------------------------------------  Indications: Swelling.  Risk Factors: None identified. Comparison Study: No prior studies. Performing Technologist: Oliver Hum RVT  Examination Guidelines: A complete evaluation includes B-mode imaging, spectral Doppler, color Doppler, and power Doppler as needed of all accessible portions of each vessel. Bilateral testing is considered an integral part of a complete examination. Limited examinations for reoccurring indications may be performed as noted. The reflux portion of the exam is performed with the patient in reverse Trendelenburg.  +-----+---------------+---------+-----------+----------+--------------+ RIGHTCompressibilityPhasicitySpontaneityPropertiesThrombus Aging +-----+---------------+---------+-----------+----------+--------------+ CFV  Full           Yes      Yes                                 +-----+---------------+---------+-----------+----------+--------------+   +---------+---------------+---------+-----------+----------+--------------+ LEFT     CompressibilityPhasicitySpontaneityPropertiesThrombus Aging +---------+---------------+---------+-----------+----------+--------------+ CFV      Full           Yes      Yes                                 +---------+---------------+---------+-----------+----------+--------------+ SFJ      Full                                                        +---------+---------------+---------+-----------+----------+--------------+ FV Prox  Full                                                        +---------+---------------+---------+-----------+----------+--------------+ FV Mid   Full                                                         +---------+---------------+---------+-----------+----------+--------------+  FV DistalFull                                                        +---------+---------------+---------+-----------+----------+--------------+ PFV      Full                                                        +---------+---------------+---------+-----------+----------+--------------+ POP      Full           Yes      Yes                                 +---------+---------------+---------+-----------+----------+--------------+ PTV      Full                                                        +---------+---------------+---------+-----------+----------+--------------+ PERO     Full                                                        +---------+---------------+---------+-----------+----------+--------------+    Summary: RIGHT: - No evidence of common femoral vein obstruction.  LEFT: - There is no evidence of deep vein thrombosis in the lower extremity.  - No cystic structure found in the popliteal fossa.  *See table(s) above for measurements and observations. Electronically signed by Deitra Mayo MD on 02/04/2022 at 6:14:44 AM.    Final    CT Angio Chest PE W/Cm &/Or Wo Cm  Result Date: 02/03/2022 CLINICAL DATA:  Cough and shortness of breath for several weeks EXAM: CT ANGIOGRAPHY CHEST WITH CONTRAST TECHNIQUE: Multidetector CT imaging of the chest was performed using the standard protocol during bolus administration of intravenous contrast. Multiplanar CT image reconstructions and MIPs were obtained to evaluate the vascular anatomy. RADIATION DOSE REDUCTION: This exam was performed according to the departmental dose-optimization program which includes automated exposure control, adjustment of the mA and/or kV according to patient size and/or use of iterative reconstruction technique. CONTRAST:  54m OMNIPAQUE IOHEXOL 350 MG/ML SOLN COMPARISON:   Chest x-ray from earlier in the same day, CT from 01/05/2022. FINDINGS: Cardiovascular: Atherosclerotic calcifications of the aorta are and its branches are seen although the degree of enhancement is minimal. No cardiac enlargement is noted. Pacing device is again seen. Pulmonary artery shows a normal branching pattern. No filling defect to suggest pulmonary embolism is noted. Scattered coronary calcifications are noted as well. Mediastinum/Nodes: Thoracic inlet is within normal limits. Right paratracheal and scattered hilar nodes are again seen stable from the prior exam felt to be reactive in nature. The esophagus as visualized is within normal limits. Lungs/Pleura: Small effusions are noted bilaterally. These of increased somewhat in the interval from the prior exam right greater than left similar to the prior study. Lungs demonstrates  some patchy airspace opacities are noted consistent with multifocal pneumonia. These have increased from the prior exam. Upper Abdomen: Visualized upper abdomen is within normal limits. Musculoskeletal: Degenerative changes of the thoracic spine are noted. No acute rib abnormality is seen. Review of the MIP images confirms the above findings. IMPRESSION: No evidence of pulmonary emboli. Slight increase in pleural effusions when compared with the prior exam right greater than left. New patchy airspace opacities likely representing early parenchymal infiltrates. Scattered stable lymph nodes in the hila and mediastinum again likely reactive in nature. Aortic Atherosclerosis (ICD10-I70.0). Electronically Signed   By: Inez Catalina M.D.   On: 02/03/2022 23:05   DG Chest 2 View  Result Date: 02/03/2022 CLINICAL DATA:  Dyspnea EXAM: CHEST - 2 VIEW COMPARISON:  12/12/2021 chest radiograph. FINDINGS: Three lead left subclavian pacemaker is stable in configuration. Stable cardiomediastinal silhouette with normal heart size. No pneumothorax. Trace bilateral pleural effusions. Mild diffuse  prominence of the parahilar interstitial markings. Streaky mild bibasilar atelectasis. IMPRESSION: 1. Mild diffuse prominence of the parahilar interstitial markings, favor mild pulmonary edema. 2. Trace bilateral pleural effusions. 3. Streaky mild bibasilar atelectasis. Electronically Signed   By: Ilona Sorrel M.D.   On: 02/03/2022 13:50

## 2022-02-05 NOTE — Plan of Care (Signed)
  Problem: Education: Goal: Knowledge of General Education information will improve Description: Including pain rating scale, medication(s)/side effects and non-pharmacologic comfort measures 02/05/2022 0816 by Emmaline Life, RN Outcome: Progressing 02/05/2022 0815 by Emmaline Life, RN Outcome: Progressing

## 2022-02-05 NOTE — Consult Note (Signed)
Cardiology Consultation   Patient ID: William Villanueva MRN: [id number]; DOB: [birth year]  Admit date: 02/03/2022 Date of Consult: 02/05/2022  PCP:  Antony Contras, MD   [facility] Cardiologist:  Kirk Ruths, MD      Patient Profile:   William Villanueva is a 71 y.o. 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 Candiss Norse.  History of Present Illness:   William Villanueva follows with Dr. Stanford Breed with initial dx of CHF in 2013 with EF 35-40% with cath at that time showing mild nonobstructive CAD. cMRI in 2013 showed EF 34% with no hyperenhancement or scar tissue and no evidence of cardiac hemochromatosis. Patient had high degree AVB in 2015 and had CRT-P placed per notes. Last nuc 2017 showed mildly dilated LV with fixed defect c/w prior infarct, EF 32%. Last echo prior to this admission in 06/2020 showed EF 45-50%. He was recently seen in the office 01/24/22 to follow up Lasix increase - recent Corvue readings were abnormal with weight gain/DOE therefore Lasix increased to 56m daily. At f/u was improving. He was continued on 851mdaily with a plan to de-scalate to 4012maily + 43m58mN if weight decreased to 255lb. SGLT2i not added due to use of Hydrea and pannus/risk of infection. BP did not tolerate Entresto so has been on ARB. 2D echo was planned. However, in the meantime he came in for increasing SOB and edema. He was seen at [facility] to be hypoxic and sent to ED. He also reported that a grandchild had recently started daycare so potentially some sick exposure there. Has not had any chest pain. Has had some orthostatic lightheadedness in the past with standing but no syncope or pre-syncope. CXR c/f mild pulmonary edema and trace bilateral effusions. CTA done for elevated d-dimer showed  no PE, slight increase in R>L pleural effusions, new patchy airspace opacities likely representing early parenchymal infiltrates, aortic atherosclerosis and scattered stable lymph nodes felt reactive per rads read.  Labs notable for pro-cal <0.10, macrocytic anemia, Cr 1.27->1.54, thrombocytosis 500's, hsTroponin 21-19, BNP 634, Covid-flu neg. 2D echo 02/04/22 EF 40%, global HK, G2DD, severely dilated LV, mild RVE, mildly elevated PASP, severe LAE, mild RAE, mild MR, dilated IVC. ARB, spironolactone on hold with rise in Cr. Written to resume carvedilol 3.125mg77m tomorrow. Otherwise receiving IV Lasix with some improvement but still volume up per Dr. PembeJohney Frameast Medical History:  Diagnosis Date   AV block, 2nd degree 2015   [employer] Allure Quadra pulse generator [id number] PM 3242   Back pain    Cardiomyopathy (HCC)    Nonischemic 45%.    CHF (congestive heart failure) (HCC) [place]    Hemochromatosis    Hypertension    Hypospadias 02/02Jan 24, 1952rn with   Nephrolithiasis    Polycythemia vera(238.4)     Past Surgical History:  Procedure Laterality Date   ANKLE SURGERY     BI-VENTRICULAR PACEMAKER INSERTION N/A 04/29/2014   Procedure: BI-VENTRICULAR PACEMAKER INSERTION (CRT-P);  Surgeon: SteveDeboraha Sprang Laterality: Left  [employer]. Jude Allure Quadra pulse generator [id number] PM [place]   CARDIAC SURGERY     COLONOSCOPY N/A 02/15/2020   Procedure: COLONOSCOPY;  Surgeon: KarkiRonnette Juniper  [facility];  Laterality:  N/A;   ESOPHAGOGASTRODUODENOSCOPY (EGD) WITH PROPOFOL N/A 02/15/2020   Procedure: ESOPHAGOGASTRODUODENOSCOPY (EGD) WITH PROPOFOL;  Surgeon: Ronnette Juniper, MD;  Location: [place];  Service: Gastroenterology;  Laterality: N/A;   PILONIDAL CYST EXCISION     POSTERIOR LAMINECTOMY / DECOMPRESSION LUMBAR SPINE     TONSILLECTOMY       Home Medications:  Prior to Admission medications   Medication Sig Start Date End Date Taking?  Authorizing Provider  acetaminophen (TYLENOL) 500 MG tablet Take 1,000 mg by mouth every 6 (six) hours as needed for mild pain.   Yes [provider]  allopurinol (ZYLOPRIM) 300 MG tablet Take 300 mg by mouth daily. 06/30/19  Yes [provider]  budesonide (PULMICORT) 0.5 MG/2ML nebulizer solution Inhale one vial in nebulizer twice a day. **Rinse mouth after use. Patient taking differently: Take 0.5 mg by nebulization 2 (two) times daily. **Rinse mouth after use.** 12/05/21  Yes Margaretha Seeds, MD  carvedilol (COREG) 3.125 MG tablet Take 1 tablet (3.125 mg total) by mouth 2 (two) times daily with a meal. 08/19/21  Yes Deboraha Sprang, MD  colestipol (COLESTID) 1 g tablet Take 1 g by mouth 2 (two) times daily. 04/24/20  Yes [provider]  Cyanocobalamin (B-12 PO) Take 1 tablet by mouth daily.   Yes [provider]  dicyclomine (BENTYL) 20 MG tablet Take 20 mg by mouth 2 (two) times daily. 03/15/20  Yes [provider]  folic acid (FOLVITE) 1 MG tablet Take 1 mg by mouth daily.   Yes [provider]  furosemide (LASIX) 40 MG tablet Take 1/2 tab in the am and lunch Saturday through Wednesday, 1 tab in the morning and 1/2 tab at lunch on Thursday and Friday Patient taking differently: 40 mg daily. 06/02/21  Yes Duke, Tami Lin, PA  hydroxyurea (HYDREA) 500 MG capsule Take 2 capsules (1,000 mg total) by mouth daily. May take with food to minimize GI side effects. Currently taking 500 mg daily but MD note says may need to increase on 12/27/2020 to twice daily. Patient taking differently: Take 1,000 mg by mouth daily. May take with food to minimize GI side effects. 12/17/20  Yes Ennever, Rudell Cobb, MD  insulin degludec (TRESIBA FLEXTOUCH) 100 UNIT/ML SOPN FlexTouch Pen Inject 20 Units into the skin daily after breakfast.    Yes [provider]  ipratropium-albuterol (DUONEB) 0.5-2.5 (3) MG/3ML SOLN Inhale one vial in nebulizer 3 times a day and  every 6 hrs as needed. Patient taking differently: Take 3 mLs by nebulization 2 (two) times daily. 12/02/21  Yes Margaretha Seeds, MD  losartan (COZAAR) 25 MG tablet Take 0.5 tablets (12.5 mg total) by mouth daily. 11/21/21  Yes Deboraha Sprang, MD  Magnesium Oxide 400 MG CAPS Take 2 capsules (800 mg total) by mouth in the morning and at bedtime. 04/26/20  Yes Deboraha Sprang, MD  omeprazole (PRILOSEC) 20 MG capsule Take 20 mg by mouth 2 (two) times daily before a meal.  07/05/16  Yes [provider]  rosuvastatin (CRESTOR) 5 MG tablet Take 5 mg by mouth daily.   Yes [provider]  spironolactone (ALDACTONE) 25 MG tablet TAKE ONE-HALF TABLET BY  MOUTH DAILY Patient taking differently: Take 12.5 mg by mouth daily. 07/04/21  Yes Lelon Perla, MD  tamsulosin (FLOMAX) 0.4 MG CAPS capsule Take 0.4 mg by mouth 2 (two) times daily.  08/03/17  Yes [provider]  Turmeric (QC TUMERIC COMPLEX) 500 MG CAPS Take 500  mg by mouth daily.   Yes [provider]  oxybutynin (DITROPAN-XL) 10 MG 24 hr tablet Take 10 mg by mouth daily. 10/02/19   [provider]    Inpatient Medications: Scheduled Meds:  allopurinol  300 mg Oral Daily   budesonide  0.5 mg Nebulization BID   [START ON 02/06/2022] carvedilol  3.125 mg Oral BID WC   colestipol  1 g Oral BID   cyanocobalamin  1,000 mcg Oral Daily   dicyclomine  20 mg Oral BID   enoxaparin (LOVENOX) injection  40 mg Subcutaneous Daily   folic acid  1 mg Oral Daily   furosemide  40 mg Intravenous Once   hydroxyurea  1,000 mg Oral Daily   insulin glargine-yfgn  20 Units Subcutaneous Daily   ipratropium-albuterol  3 mL Nebulization BID   magnesium oxide  800 mg Oral BID   oxybutynin  10 mg Oral Daily   pantoprazole  40 mg Oral Daily   rosuvastatin  5 mg Oral Daily   tamsulosin  0.4 mg Oral BID   Continuous Infusions:  PRN Meds: acetaminophen, benzonatate, guaiFENesin-dextromethorphan, ondansetron (ZOFRAN) IV,  mouth rinse  Allergies:    Allergies  Allergen Reactions   Bupropion Hives and Other (See Comments)   Cephalexin Diarrhea and Other (See Comments)    Caused C-diff, also    Fluoxetine Itching   Ibuprofen Itching   Prednisone Itching and Other (See Comments)    Abdominal pain, also    Temazepam Other (See Comments)    Dizziness    Trazodone And Nefazodone Other (See Comments)    Dizziness   Fluoxetine Hcl Itching   Prozac [Fluoxetine Hcl] Itching    Social History:   Social History   Socioeconomic History   Marital status: Married    Spouse name: Not on file   Number of children: Not on file   Years of education: Not on file   Highest education level: Not on file  Occupational History   Not on file  Tobacco Use   Smoking status: Former    Packs/day: 1.00    Years: 44.00    Total pack years: 44.00    Types: Cigarettes    Start date: 06/05/1968    Quit date: 04/05/2013    Years since quitting: 8.8   Smokeless tobacco: Never   Tobacco comments:    quit in 2014  Vaping Use   Vaping Use: Never used  Substance and Sexual Activity   Alcohol use: Yes    Alcohol/week: 0.0 standard drinks of alcohol    Comment: rare   Drug use: No   Sexual activity: Not Currently  Other Topics Concern   Not on file  Social History Narrative   Lives at home with wife in a one story home.  Has no children.  Does not work.  Getting workman's comp.  Education: 4 years trade school.    Social Determinants of Health   Financial Resource Strain: Not on file  Food Insecurity: Not on file  Transportation Needs: Not on file  Physical Activity: Not on file  Stress: Not on file  Social Connections: Not on file  Intimate Partner Violence: Not on file    Family History:    Family History  Problem Relation Age of Onset   Heart disease Maternal Grandmother        Pacemaker, MI   Stroke Mother    Other Father        Deceased, car fell on him  Diabetes Sister    Hypertension Sister       ROS:  Please see the history of present illness.  All other ROS reviewed and negative.     Physical Exam/Data:   Vitals:   02/05/22 0007 02/05/22 0341 02/05/22 0400 02/05/22 0745  BP: (!) 96/56 110/68  116/84  Pulse: 93 93  82  Resp: 17 17  17   Temp: 97.8 F (36.6 C) 97.8 F (36.6 C)  97.8 F (36.6 C)  TempSrc: Oral Oral  Oral  SpO2: 94%  94% 96%  Weight:  116.3 kg    Height:        Intake/Output Summary (Last 24 hours) at 02/05/2022 0831 Last data filed at 02/05/2022 0700 Gross per 24 hour  Intake 480 ml  Output 675 ml  Net -195 ml      02/05/2022    3:41 AM 02/04/2022    1:50 PM 01/24/2022    4:14 PM  Last 3 Weights  Weight (lbs) 256 lb 4.8 oz 255 lb 11.7 oz 263 lb  Weight (kg) 116.257 kg 116 kg 119.296 kg     Body mass index is 36.78 kg/m.  General:  Well nourished, well developed, in no acute distress HEENT: normal Neck: JVD to mid-neck Vascular: No carotid bruits; Distal pulses 2+ bilaterally Cardiac:  RRR, no murmur Lungs:  Bibasilar crackles with scattered rales Abd: soft, nontender, no hepatomegaly  Ext: 1+ pitting edema, warm Musculoskeletal:  No deformities, BUE and BLE strength normal and equal Skin: warm and dry  Neuro:  CNs 2-12 intact, no focal abnormalities noted Psych:  Normal affect   EKG:  The EKG was personally reviewed and demonstrates:  atrial sensed V paced rhythm, occasional PVCs, nonspecific STTW changes  Telemetry:  Telemetry was personally reviewed and demonstrates:  NSR, V pacing, brief runs NSVT  Relevant CV Studies: 2D echo 02/04/22    1. Left ventricular ejection fraction, by estimation, is 40%. The left  ventricle has moderately decreased function. The left ventricle  demonstrates global hypokinesis. The left ventricular internal cavity size  was severely dilated. Left ventricular  diastolic parameters are consistent with Grade II diastolic dysfunction  (pseudonormalization). Elevated left atrial pressure. The E/e' is  19.8.   2. Right ventricular systolic function is normal. The right ventricular  size is mildly enlarged. There is mildly elevated pulmonary artery  systolic pressure.   3. Left atrial size was severely dilated.   4. Right atrial size was mildly dilated.   5. The mitral valve is grossly normal. Mild mitral valve regurgitation.   6. The aortic valve is tricuspid. There is mild thickening of the aortic  valve. Aortic valve regurgitation is not visualized. Aortic valve  sclerosis is present, with no evidence of aortic valve stenosis.   7. The inferior vena cava is dilated in size with >50% respiratory  variability, suggesting right atrial pressure of 8 mmHg.   Comparison(s): Compared to prior TTE on 06/2020, the LV is now severely  dilated with EF ~40% (previously moderately dilated with EF 45-50%).   Laboratory Data:  High Sensitivity Troponin:   Recent Labs  Lab 02/03/22 2023 02/03/22 2036  TROPONINIHS 21* 19*     Chemistry Recent Labs  Lab 02/03/22 1314 02/04/22 0940 02/05/22 0259  NA 139  --  139  K 4.0  --  4.0  CL 110  --  102  CO2 24  --  26  GLUCOSE 108*  --  109*  BUN 16  --  18  CREATININE 1.27*  --  1.54*  CALCIUM 8.5*  --  8.6*  MG  --  2.0 1.8  GFRNONAA >60  --  48*  ANIONGAP 5  --  11    Recent Labs  Lab 02/03/22 1314 02/05/22 0259  PROT 6.9 6.6  ALBUMIN 3.5 3.3*  AST 14* 14*  ALT 8 7  ALKPHOS 58 58  BILITOT 1.0 1.0   Lipids No results for input(s): "CHOL", "TRIG", "HDL", "LABVLDL", "LDLCALC", "CHOLHDL" in the last 168 hours.  Hematology Recent Labs  Lab 02/03/22 1314 02/04/22 0536 02/05/22 0259  WBC 7.0 5.7 5.4  RBC 3.07* 3.02* 2.84*  HGB 10.2* 10.1* 9.7*  HCT 34.2* 33.4* 30.6*  MCV 111.4* 110.6* 107.7*  MCH 33.2 33.4 34.2*  MCHC 29.8* 30.2 31.7  RDW 16.1* 16.4* 16.4*  PLT 512* 548* 518*   Thyroid No results for input(s): "TSH", "FREET4" in the last 168 hours.  BNP Recent Labs  Lab 02/03/22 1314 02/05/22 0259  BNP 634.0* 475.2*     DDimer  Recent Labs  Lab 02/03/22 2023  DDIMER 1.04*     Radiology/Studies:  ECHOCARDIOGRAM COMPLETE  Result Date: 02/04/2022    ECHOCARDIOGRAM REPORT   Patient Name:   HERBY AMICK Date of Exam: 02/04/2022 Medical Rec #:  [id number]        Height:       70.0 in Accession #:    [id number]       Weight:       263.0 lb Date of Birth:  11-Dec-1950         BSA:          2.345 m Patient Age:    52 years         BP:           112/68 mmHg Patient Gender: M                HR:           81 bpm. Exam Location:  Inpatient Procedure: 2D Echo, Cardiac Doppler, Color Doppler and Intracardiac            Opacification Agent Indications:    CHF  History:        Patient has prior history of Echocardiogram examinations, most                 recent 06/21/2020. Signs/Symptoms:Shortness of Breath and                 Dyspnea.  Sonographer:    Merrie Roof RDCS Referring Phys: [place]  1. Left ventricular ejection fraction, by estimation, is 40%. The left ventricle has moderately decreased function. The left ventricle demonstrates global hypokinesis. The left ventricular internal cavity size was severely dilated. Left ventricular diastolic parameters are consistent with Grade II diastolic dysfunction (pseudonormalization). Elevated left atrial pressure. The E/e' is 19.8.  2. Right ventricular systolic function is normal. The right ventricular size is mildly enlarged. There is mildly elevated pulmonary artery systolic pressure.  3. Left atrial size was severely dilated.  4. Right atrial size was mildly dilated.  5. The mitral valve is grossly normal. Mild mitral valve regurgitation.  6. The aortic valve is tricuspid. There is mild thickening of the aortic valve. Aortic valve regurgitation is not visualized. Aortic valve sclerosis is present, with no evidence of aortic valve stenosis.  7. The inferior vena cava is dilated in size with >50% respiratory variability, suggesting right atrial pressure  of 8  mmHg. Comparison(s): Compared to prior TTE on 06/2020, the LV is now severely dilated with EF ~40% (previously moderately dilated with EF 45-50%). FINDINGS  Left Ventricle: Left ventricular ejection fraction, by estimation, is 40%. The left ventricle has moderately decreased function. The left ventricle demonstrates global hypokinesis. Definity contrast agent was given IV to delineate the left ventricular endocardial borders. The left ventricular internal cavity size was severely dilated. There is no left ventricular hypertrophy. Left ventricular diastolic parameters are consistent with Grade II diastolic dysfunction (pseudonormalization). Elevated left atrial pressure. The E/e' is 19.8. Right Ventricle: The right ventricular size is mildly enlarged. No increase in right ventricular wall thickness. Right ventricular systolic function is normal. There is mildly elevated pulmonary artery systolic pressure. The tricuspid regurgitant velocity is 2.91 m/s, and with an assumed right atrial pressure of 8 mmHg, the estimated right ventricular systolic pressure is 53.6 mmHg. Left Atrium: Left atrial size was severely dilated. Right Atrium: Right atrial size was mildly dilated. Pericardium: There is no evidence of pericardial effusion. Mitral Valve: The mitral valve is grossly normal. There is mild thickening of the mitral valve leaflet(s). There is mild calcification of the mitral valve leaflet(s). Mild mitral valve regurgitation. Tricuspid Valve: The tricuspid valve is normal in structure. Tricuspid valve regurgitation is trivial. Aortic Valve: The aortic valve is tricuspid. There is mild thickening of the aortic valve. Aortic valve regurgitation is not visualized. Aortic valve sclerosis is present, with no evidence of aortic valve stenosis. Aortic valve mean gradient measures 5.0  mmHg. Aortic valve peak gradient measures 8.5 mmHg. Aortic valve area, by VTI measures 1.32 cm. Pulmonic Valve: The pulmonic valve was normal  in structure. Pulmonic valve regurgitation is not visualized. Aorta: The aortic root and ascending aorta are structurally normal, with no evidence of dilitation. Venous: The inferior vena cava is dilated in size with greater than 50% respiratory variability, suggesting right atrial pressure of 8 mmHg. IAS/Shunts: The atrial septum is grossly normal. Additional Comments: A device lead is visualized.  LEFT VENTRICLE PLAX 2D LVIDd:         6.90 cm      Diastology LVIDs:         5.70 cm      LV e' medial:    7.18 cm/s LV PW:         1.00 cm      LV E/e' medial:  15.3 LV IVS:        0.90 cm      LV e' lateral:   5.55 cm/s LVOT diam:     2.00 cm      LV E/e' lateral: 19.8 LV SV:         36 LV SV Index:   16 LVOT Area:     3.14 cm  LV Volumes (MOD) LV vol d, MOD A2C: 283.0 ml LV vol d, MOD A4C: 305.0 ml LV vol s, MOD A2C: 202.5 ml LV vol s, MOD A4C: 168.0 ml LV SV MOD A2C:     80.5 ml LV SV MOD A4C:     305.0 ml LV SV MOD BP:      115.5 ml RIGHT VENTRICLE             IVC RV Basal diam:  4.70 cm     IVC diam: 2.60 cm RV Mid diam:    3.70 cm RV S prime:     11.60 cm/s TAPSE (M-mode): 2.8 cm LEFT ATRIUM  Index        RIGHT ATRIUM           Index LA diam:        5.30 cm  2.26 cm/m   RA Area:     24.10 cm LA Vol (A2C):   92.7 ml  39.53 ml/m  RA Volume:   84.20 ml  35.91 ml/m LA Vol (A4C):   117.0 ml 49.89 ml/m LA Biplane Vol: 113.0 ml 48.19 ml/m  AORTIC VALVE AV Area (Vmax):    1.50 cm AV Area (Vmean):   1.38 cm AV Area (VTI):     1.32 cm AV Vmax:           146.00 cm/s AV Vmean:          98.500 cm/s AV VTI:            0.276 m AV Peak Grad:      8.5 mmHg AV Mean Grad:      5.0 mmHg LVOT Vmax:         69.50 cm/s LVOT Vmean:        43.200 cm/s LVOT VTI:          0.116 m LVOT/AV VTI ratio: 0.42  AORTA Ao Root diam: 3.60 cm Ao Asc diam:  3.50 cm MITRAL VALVE                TRICUSPID VALVE MV Area (PHT): 5.38 cm     TR Peak grad:   33.9 mmHg MV Decel Time: 141 msec     TR Vmax:        291.00 cm/s MV E velocity:  110.00 cm/s MV A velocity: 78.50 cm/s   SHUNTS MV E/A ratio:  1.40         Systemic VTI:  0.12 m                             Systemic Diam: 2.00 cm Gwyndolyn Kaufman MD Electronically signed by Gwyndolyn Kaufman MD Signature Date/Time: 02/04/2022/1:39:17 PM    Final    VAS [place] LOWER EXTREMITY VENOUS (DVT) (7a-7p)  Result Date: 02/04/2022  Lower Venous DVT Study Patient Name:  TALIK CASIQUE  Date of Exam:   02/03/2022 Medical Rec #: [id number]         Accession #:    [id number] Date of Birth: 12-19-1950          Patient Gender: M Patient Age:   72 years Exam Location:  [facility] Procedure:      VAS [place] LOWER EXTREMITY VENOUS (DVT) Referring Phys: Ova Freshwater FONDAW --------------------------------------------------------------------------------  Indications: Swelling.  Risk Factors: None identified. Comparison Study: No prior studies. Performing Technologist: Oliver Hum RVT  Examination Guidelines: A complete evaluation includes B-mode imaging, spectral Doppler, color Doppler, and power Doppler as needed of all accessible portions of each vessel. Bilateral testing is considered an integral part of a complete examination. Limited examinations for reoccurring indications may be performed as noted. The reflux portion of the exam is performed with the patient in reverse Trendelenburg.  +-----+---------------+---------+-----------+----------+--------------+ RIGHTCompressibilityPhasicitySpontaneityPropertiesThrombus Aging +-----+---------------+---------+-----------+----------+--------------+ CFV  Full           Yes      Yes                                 +-----+---------------+---------+-----------+----------+--------------+   +---------+---------------+---------+-----------+----------+--------------+ LEFT     CompressibilityPhasicitySpontaneityPropertiesThrombus Aging +---------+---------------+---------+-----------+----------+--------------+  CFV      Full           Yes      Yes                                  +---------+---------------+---------+-----------+----------+--------------+ SFJ      Full                                                        +---------+---------------+---------+-----------+----------+--------------+ FV Prox  Full                                                        +---------+---------------+---------+-----------+----------+--------------+ FV Mid   Full                                                        +---------+---------------+---------+-----------+----------+--------------+ FV DistalFull                                                        +---------+---------------+---------+-----------+----------+--------------+ PFV      Full                                                        +---------+---------------+---------+-----------+----------+--------------+ POP      Full           Yes      Yes                                 +---------+---------------+---------+-----------+----------+--------------+ PTV      Full                                                        +---------+---------------+---------+-----------+----------+--------------+ PERO     Full                                                        +---------+---------------+---------+-----------+----------+--------------+    Summary: RIGHT: - No evidence of common femoral vein obstruction.  LEFT: - There is no evidence of deep vein thrombosis in the lower extremity.  - No cystic structure found in the popliteal fossa.  *See table(s) above for measurements and observations. Electronically signed by Deitra Mayo MD on 02/04/2022 at  6:14:44 AM.    Final    CT Angio Chest PE W/Cm &/Or Wo Cm  Result Date: 02/03/2022 CLINICAL DATA:  Cough and shortness of breath for several weeks EXAM: CT ANGIOGRAPHY CHEST WITH CONTRAST TECHNIQUE: Multidetector CT imaging of the chest was performed using the standard protocol during bolus  administration of intravenous contrast. Multiplanar CT image reconstructions and MIPs were obtained to evaluate the vascular anatomy. RADIATION DOSE REDUCTION: This exam was performed according to the departmental dose-optimization program which includes automated exposure control, adjustment of the mA and/or kV according to patient size and/or use of iterative reconstruction technique. CONTRAST:  26m OMNIPAQUE IOHEXOL 350 MG/ML SOLN COMPARISON:  Chest x-ray from earlier in the same day, CT from 01/05/2022. FINDINGS: Cardiovascular: Atherosclerotic calcifications of the aorta are and its branches are seen although the degree of enhancement is minimal. No cardiac enlargement is noted. Pacing device is again seen. Pulmonary artery shows a normal branching pattern. No filling defect to suggest pulmonary embolism is noted. Scattered coronary calcifications are noted as well. Mediastinum/Nodes: Thoracic inlet is within normal limits. Right paratracheal and scattered hilar nodes are again seen stable from the prior exam felt to be reactive in nature. The esophagus as visualized is within normal limits. Lungs/Pleura: Small effusions are noted bilaterally. These of increased somewhat in the interval from the prior exam right greater than left similar to the prior study. Lungs demonstrates some patchy airspace opacities are noted consistent with multifocal pneumonia. These have increased from the prior exam. Upper Abdomen: Visualized upper abdomen is within normal limits. Musculoskeletal: Degenerative changes of the thoracic spine are noted. No acute rib abnormality is seen. Review of the MIP images confirms the above findings. IMPRESSION: No evidence of pulmonary emboli. Slight increase in pleural effusions when compared with the prior exam right greater than left. New patchy airspace opacities likely representing early parenchymal infiltrates. Scattered stable lymph nodes in the hila and mediastinum again likely reactive  in nature. Aortic Atherosclerosis (ICD10-I70.0). Electronically Signed   By: MInez CatalinaM.D.   On: 02/03/2022 23:05   DG Chest 2 View  Result Date: 02/03/2022 CLINICAL DATA:  Dyspnea EXAM: CHEST - 2 VIEW COMPARISON:  12/12/2021 chest radiograph. FINDINGS: Three lead left subclavian pacemaker is stable in configuration. Stable cardiomediastinal silhouette with normal heart size. No pneumothorax. Trace bilateral pleural effusions. Mild diffuse prominence of the parahilar interstitial markings. Streaky mild bibasilar atelectasis. IMPRESSION: 1. Mild diffuse prominence of the parahilar interstitial markings, favor mild pulmonary edema. 2. Trace bilateral pleural effusions. 3. Streaky mild bibasilar atelectasis. Electronically Signed   By: JIlona SorrelM.D.   On: 02/03/2022 13:50     Assessment and Plan:   1. 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 Essential HTN with tendency for borderline BPs here 3. Minimally elevated troponin with hx mild nonobstructive CAD 2013, dyslipidemia not felt to reflect ACS 4. 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 NSVT 6. Macrocytic anemia, hx polycythemia vera 7. Obesity 8. Possible pulmonary infiltrates, per medicine team 9. H/o STJ PPM  Patient seen/examined by Dr. PJohney Frame assisting with note. Per our discussion she recommends to continue IV Lasix 475mBID given ongoing volume overload, follow Cr given AKI post-contrast/diuresis, hold losartan/spiro as planned, and interrogate pacemaker tomorrow (will need to be called tomorrow as she did not feel we needed to reach out to weekend rep). She agrees with resumption of carvedilol in AM. Follow BP. I have cancelled  his OP 10/10 echo appt and sent msg to schedulers to remove from work queue. Also placed care order for nurse to notify patient as well. See below for comprehensive MD thoughts.  Risk Assessment/Risk Scores:         New York Heart Association (NYHA) Functional Class NYHA Class III-IV   For questions or updates, please contact [place] Please consult [email or web address] for contact info under    Signed, Charlie Pitter, PA-C  02/05/2022 8:31 AM   Patient seen and examined and agree with Melina Copa, PA-C as detailed above.   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 Cardiology is now consulted for acute on chronic systolic HF exacerbation.  Patient with known history of NICM with cath in 2013 without significant CAD. cMRI in 2013 with no hyperenhancement or scar. Was doing well until about 2 weeks ago when he developed worsening SOB and cough. Corvue reading were abnormal at that time and he was started on lasix 109m with improvement and had started to de-escalate but his symptoms worsened again. Notably, he and his wife got sick from a their great grandchild at daycare which he thinks may be contributing.  Here, Trop 21>19. BNP 634. ECG a-sensed, v-paced with PVCs.  TTE here with severe LV dilation, EF ~40% which is slightly worse from prior (45%). No significant valve disease.  Currently, symptoms improving with IV diuresis. Will continue today and interrogate device on Monday. No evidence of ischemia.   GEN: No acute distress.  Neck: JVD to mid-neck Cardiac: RRR, no murmurs, rubs, or gallops.  Respiratory: Bibasilar crackles, scattered rales GI: Soft, nontender, non-distended  MS: 1+ LE edema Neuro:  Nonfocal  Psych: Normal affect    Plan: -Continue lasix 412mIV BID -Interrogate device on Monday but no reports of palpitations/presyncope to suggest arrhythmias -Continue coreg 3.1258mID -Add back spiro/losartan once renal function back to  baseline (likely elevated due to vascular congestion and contrast received for CTA) -Monitor I/Os and daily weights -Low Na diet  HeaGwyndolyn KaufmanD

## 2022-02-06 ENCOUNTER — Encounter (HOSPITAL_COMMUNITY): Payer: Self-pay | Admitting: Internal Medicine

## 2022-02-06 DIAGNOSIS — I5043 Acute on chronic combined systolic (congestive) and diastolic (congestive) heart failure: Secondary | ICD-10-CM | POA: Diagnosis not present

## 2022-02-06 LAB — LIPID PANEL
Cholesterol: 70 mg/dL (ref 0–200)
HDL: 26 mg/dL — ABNORMAL LOW (ref >40)
LDL Cholesterol: 27 mg/dL (ref 0–99)
Total CHOL/HDL Ratio: 2.7 RATIO
Triglycerides: 83 mg/dL (ref <150)
VLDL: 17 mg/dL (ref 0–40)

## 2022-02-06 LAB — CBC WITH DIFFERENTIAL/PLATELET
Abs Immature Granulocytes: 0.05 10*3/uL (ref 0.00–0.07)
Basophils Absolute: 0.1 10*3/uL (ref 0.0–0.1)
Basophils Relative: 2 %
Eosinophils Absolute: 0.1 10*3/uL (ref 0.0–0.5)
Eosinophils Relative: 1 %
HCT: 32.4 % — ABNORMAL LOW (ref 39.0–52.0)
Hemoglobin: 10 g/dL — ABNORMAL LOW (ref 13.0–17.0)
Immature Granulocytes: 1 %
Lymphocytes Relative: 10 %
Lymphs Abs: 0.6 10*3/uL — ABNORMAL LOW (ref 0.7–4.0)
MCH: 33.3 pg (ref 26.0–34.0)
MCHC: 30.9 g/dL (ref 30.0–36.0)
MCV: 108 fL — ABNORMAL HIGH (ref 80.0–100.0)
Monocytes Absolute: 0.5 10*3/uL (ref 0.1–1.0)
Monocytes Relative: 10 %
Neutro Abs: 4.2 10*3/uL (ref 1.7–7.7)
Neutrophils Relative %: 76 %
Platelets: 543 10*3/uL — ABNORMAL HIGH (ref 150–400)
RBC: 3 MIL/uL — ABNORMAL LOW (ref 4.22–5.81)
RDW: 16.5 % — ABNORMAL HIGH (ref 11.5–15.5)
WBC: 5.5 10*3/uL (ref 4.0–10.5)
nRBC: 0 % (ref 0.0–0.2)

## 2022-02-06 LAB — COMPREHENSIVE METABOLIC PANEL
ALT: 8 U/L (ref 0–44)
AST: 13 U/L — ABNORMAL LOW (ref 15–41)
Albumin: 3.4 g/dL — ABNORMAL LOW (ref 3.5–5.0)
Alkaline Phosphatase: 60 U/L (ref 38–126)
Anion gap: 11 (ref 5–15)
BUN: 18 mg/dL (ref 8–23)
CO2: 24 mmol/L (ref 22–32)
Calcium: 8.7 mg/dL — ABNORMAL LOW (ref 8.9–10.3)
Chloride: 103 mmol/L (ref 98–111)
Creatinine, Ser: 1.45 mg/dL — ABNORMAL HIGH (ref 0.61–1.24)
GFR, Estimated: 52 mL/min — ABNORMAL LOW (ref >60)
Glucose, Bld: 124 mg/dL — ABNORMAL HIGH (ref 70–99)
Potassium: 3.7 mmol/L (ref 3.5–5.1)
Sodium: 138 mmol/L (ref 135–145)
Total Bilirubin: 0.9 mg/dL (ref 0.3–1.2)
Total Protein: 6.8 g/dL (ref 6.5–8.1)

## 2022-02-06 LAB — GLUCOSE, CAPILLARY
Glucose-Capillary: 105 mg/dL — ABNORMAL HIGH (ref 70–99)
Glucose-Capillary: 101 mg/dL — ABNORMAL HIGH (ref 70–99)
Glucose-Capillary: 124 mg/dL — ABNORMAL HIGH (ref 70–99)
Glucose-Capillary: 115 mg/dL — ABNORMAL HIGH (ref 70–99)

## 2022-02-06 LAB — MAGNESIUM: Magnesium: 1.8 mg/dL (ref 1.7–2.4)

## 2022-02-06 LAB — BRAIN NATRIURETIC PEPTIDE: B Natriuretic Peptide: 450.8 pg/mL — ABNORMAL HIGH (ref 0.0–100.0)

## 2022-02-06 LAB — PROCALCITONIN: Procalcitonin: <0.1 ng/mL

## 2022-02-06 LAB — C-REACTIVE PROTEIN: CRP: 5 mg/dL — ABNORMAL HIGH (ref <1.0)

## 2022-02-06 MED ORDER — MAGNESIUM SULFATE 2 GM/50ML IV SOLN
2.0000 g | Freq: Once | INTRAVENOUS | Status: AC
  Administered 2022-02-06: 2 g via INTRAVENOUS
  Filled 2022-02-06: qty 50

## 2022-02-06 MED ORDER — POTASSIUM CHLORIDE CRYS ER 20 MEQ PO TBCR
40.0000 meq | EXTENDED_RELEASE_TABLET | Freq: Once | ORAL | Status: AC
  Administered 2022-02-06: 40 meq via ORAL
  Filled 2022-02-06: qty 2

## 2022-02-06 MED ORDER — SPIRONOLACTONE 12.5 MG HALF TABLET
12.5000 mg | ORAL_TABLET | Freq: Every day | ORAL | Status: DC
  Administered 2022-02-06 – 2022-02-09 (×4): 12.5 mg via ORAL
  Filled 2022-02-06 (×4): qty 1

## 2022-02-06 MED ORDER — LOSARTAN POTASSIUM 25 MG PO TABS
25.0000 mg | ORAL_TABLET | Freq: Every day | ORAL | Status: DC
  Administered 2022-02-06 – 2022-02-10 (×5): 25 mg via ORAL
  Filled 2022-02-06 (×5): qty 1

## 2022-02-06 MED ORDER — IPRATROPIUM-ALBUTEROL 0.5-2.5 (3) MG/3ML IN SOLN: 3.0000 mL | Freq: Four times a day (QID) | RESPIRATORY_TRACT | Status: DC | PRN

## 2022-02-06 NOTE — Progress Notes (Signed)
Rounding Note    Patient Name: William Villanueva Date of Encounter: 02/06/2022  [place] Cardiologist: Kirk Ruths, MD   Subjective   No CP; Dyspnea improving; still with productive cough.  Inpatient Medications    Scheduled Meds:  allopurinol  300 mg Oral Daily   budesonide  0.5 mg Nebulization BID   carvedilol  3.125 mg Oral BID WC   colestipol  1 g Oral BID   cyanocobalamin  1,000 mcg Oral Daily   dicyclomine  20 mg Oral BID   enoxaparin (LOVENOX) injection  40 mg Subcutaneous Daily   folic acid  1 mg Oral Daily   furosemide  40 mg Intravenous BID   hydroxyurea  1,000 mg Oral Daily   insulin glargine-yfgn  20 Units Subcutaneous Daily   ipratropium-albuterol  3 mL Nebulization BID   magnesium oxide  800 mg Oral BID   oxybutynin  10 mg Oral Daily   pantoprazole  40 mg Oral Daily   rosuvastatin  5 mg Oral Daily   tamsulosin  0.4 mg Oral BID   Continuous Infusions:  PRN Meds: acetaminophen, benzonatate, guaiFENesin-dextromethorphan, ondansetron (ZOFRAN) IV, mouth rinse, zolpidem   Vital Signs    Vitals:   02/05/22 0834 02/05/22 1822 02/05/22 2048 02/06/22 0549  BP:  129/64 122/63 108/70  Pulse:  99 98 99  Resp:    20  Temp:  97.9 F (36.6 C) 97.8 F (36.6 C) (!) 97.5 F (36.4 C)  TempSrc:  Oral Oral Oral  SpO2: 95% 90% 94% 92%  Weight:    114.9 kg  Height:        Intake/Output Summary (Last 24 hours) at 02/06/2022 0804 Last data filed at 02/06/2022 0554 Gross per 24 hour  Intake 480 ml  Output 775 ml  Net -295 ml      02/06/2022    5:49 AM 02/05/2022    3:41 AM 02/04/2022    1:50 PM  Last 3 Weights  Weight (lbs) 253 lb 4.8 oz 256 lb 4.8 oz 255 lb 11.7 oz  Weight (kg) 114.896 kg 116.257 kg 116 kg      Telemetry    Sinus with ventricular pacing and 9 beats nonsustained ventricular tachycardia- Personally Reviewed   Physical Exam   GEN: No acute distress.  Obese Neck: supple Cardiac: RRR Respiratory: Clear to auscultation  bilaterally. GI: Soft, nontender, non-distended  MS: No edema Neuro:  Nonfocal  Psych: Normal affect   Labs    High Sensitivity Troponin:   Recent Labs  Lab 02/03/22 2023 02/03/22 2036  TROPONINIHS 21* 19*     Chemistry Recent Labs  Lab 02/03/22 1314 02/04/22 0940 02/05/22 0259 02/06/22 0100  NA 139  --  139 138  K 4.0  --  4.0 3.7  CL 110  --  102 103  CO2 24  --  26 24  GLUCOSE 108*  --  109* 124*  BUN 16  --  18 18  CREATININE 1.27*  --  1.54* 1.45*  CALCIUM 8.5*  --  8.6* 8.7*  MG  --  2.0 1.8 1.8  PROT 6.9  --  6.6 6.8  ALBUMIN 3.5  --  3.3* 3.4*  AST 14*  --  14* 13*  ALT 8  --  7 8  ALKPHOS 58  --  58 60  BILITOT 1.0  --  1.0 0.9  GFRNONAA >60  --  48* 52*  ANIONGAP 5  --  11 11    Lipids  Recent Labs  Lab 02/06/22 0100  CHOL 70  TRIG 83  HDL 26*  LDLCALC 27  CHOLHDL 2.7    Hematology Recent Labs  Lab 02/04/22 0536 02/05/22 0259 02/06/22 0100  WBC 5.7 5.4 5.5  RBC 3.02* 2.84* 3.00*  HGB 10.1* 9.7* 10.0*  HCT 33.4* 30.6* 32.4*  MCV 110.6* 107.7* 108.0*  MCH 33.4 34.2* 33.3  MCHC 30.2 31.7 30.9  RDW 16.4* 16.4* 16.5*  PLT 548* 518* 543*   BNP Recent Labs  Lab 02/03/22 1314 02/05/22 0259 02/06/22 0100  BNP 634.0* 475.2* 450.8*    DDimer  Recent Labs  Lab 02/03/22 2023  DDIMER 1.04*     Radiology    ECHOCARDIOGRAM COMPLETE  Result Date: 02/04/2022    ECHOCARDIOGRAM REPORT   Patient Name:   William Villanueva Date of Exam: 02/04/2022 Medical Rec #:  [id number]        Height:       70.0 in Accession #:    [id number]       Weight:       263.0 lb Date of Birth:  August 11, 1950         BSA:          2.345 m Patient Age:    71 years         BP:           112/68 mmHg Patient Gender: M                HR:           81 bpm. Exam Location:  Inpatient Procedure: 2D Echo, Cardiac Doppler, Color Doppler and Intracardiac            Opacification Agent Indications:    CHF  History:        Patient has prior history of Echocardiogram examinations, most                  recent 06/21/2020. Signs/Symptoms:Shortness of Breath and                 Dyspnea.  Sonographer:    Merrie Roof RDCS Referring Phys: [place]  1. Left ventricular ejection fraction, by estimation, is 40%. The left ventricle has moderately decreased function. The left ventricle demonstrates global hypokinesis. The left ventricular internal cavity size was severely dilated. Left ventricular diastolic parameters are consistent with Grade II diastolic dysfunction (pseudonormalization). Elevated left atrial pressure. The E/e' is 19.8.  2. Right ventricular systolic function is normal. The right ventricular size is mildly enlarged. There is mildly elevated pulmonary artery systolic pressure.  3. Left atrial size was severely dilated.  4. Right atrial size was mildly dilated.  5. The mitral valve is grossly normal. Mild mitral valve regurgitation.  6. The aortic valve is tricuspid. There is mild thickening of the aortic valve. Aortic valve regurgitation is not visualized. Aortic valve sclerosis is present, with no evidence of aortic valve stenosis.  7. The inferior vena cava is dilated in size with >50% respiratory variability, suggesting right atrial pressure of 8 mmHg. Comparison(s): Compared to prior TTE on 06/2020, the LV is now severely dilated with EF ~40% (previously moderately dilated with EF 45-50%). FINDINGS  Left Ventricle: Left ventricular ejection fraction, by estimation, is 40%. The left ventricle has moderately decreased function. The left ventricle demonstrates global hypokinesis. Definity contrast agent was given IV to delineate the left ventricular endocardial borders. The left ventricular internal cavity size was severely dilated. There is  no left ventricular hypertrophy. Left ventricular diastolic parameters are consistent with Grade II diastolic dysfunction (pseudonormalization). Elevated left atrial pressure. The E/e' is 19.8. Right Ventricle: The right ventricular  size is mildly enlarged. No increase in right ventricular wall thickness. Right ventricular systolic function is normal. There is mildly elevated pulmonary artery systolic pressure. The tricuspid regurgitant velocity is 2.91 m/s, and with an assumed right atrial pressure of 8 mmHg, the estimated right ventricular systolic pressure is 69.7 mmHg. Left Atrium: Left atrial size was severely dilated. Right Atrium: Right atrial size was mildly dilated. Pericardium: There is no evidence of pericardial effusion. Mitral Valve: The mitral valve is grossly normal. There is mild thickening of the mitral valve leaflet(s). There is mild calcification of the mitral valve leaflet(s). Mild mitral valve regurgitation. Tricuspid Valve: The tricuspid valve is normal in structure. Tricuspid valve regurgitation is trivial. Aortic Valve: The aortic valve is tricuspid. There is mild thickening of the aortic valve. Aortic valve regurgitation is not visualized. Aortic valve sclerosis is present, with no evidence of aortic valve stenosis. Aortic valve mean gradient measures 5.0  mmHg. Aortic valve peak gradient measures 8.5 mmHg. Aortic valve area, by VTI measures 1.32 cm. Pulmonic Valve: The pulmonic valve was normal in structure. Pulmonic valve regurgitation is not visualized. Aorta: The aortic root and ascending aorta are structurally normal, with no evidence of dilitation. Venous: The inferior vena cava is dilated in size with greater than 50% respiratory variability, suggesting right atrial pressure of 8 mmHg. IAS/Shunts: The atrial septum is grossly normal. Additional Comments: A device lead is visualized.  LEFT VENTRICLE PLAX 2D LVIDd:         6.90 cm      Diastology LVIDs:         5.70 cm      LV e' medial:    7.18 cm/s LV PW:         1.00 cm      LV E/e' medial:  15.3 LV IVS:        0.90 cm      LV e' lateral:   5.55 cm/s LVOT diam:     2.00 cm      LV E/e' lateral: 19.8 LV SV:         36 LV SV Index:   16 LVOT Area:     3.14 cm   LV Volumes (MOD) LV vol d, MOD A2C: 283.0 ml LV vol d, MOD A4C: 305.0 ml LV vol s, MOD A2C: 202.5 ml LV vol s, MOD A4C: 168.0 ml LV SV MOD A2C:     80.5 ml LV SV MOD A4C:     305.0 ml LV SV MOD BP:      115.5 ml RIGHT VENTRICLE             IVC RV Basal diam:  4.70 cm     IVC diam: 2.60 cm RV Mid diam:    3.70 cm RV S prime:     11.60 cm/s TAPSE (M-mode): 2.8 cm LEFT ATRIUM              Index        RIGHT ATRIUM           Index LA diam:        5.30 cm  2.26 cm/m   RA Area:     24.10 cm LA Vol (A2C):   92.7 ml  39.53 ml/m  RA Volume:   84.20 ml  35.91 ml/m LA Vol (  A4C):   117.0 ml 49.89 ml/m LA Biplane Vol: 113.0 ml 48.19 ml/m  AORTIC VALVE AV Area (Vmax):    1.50 cm AV Area (Vmean):   1.38 cm AV Area (VTI):     1.32 cm AV Vmax:           146.00 cm/s AV Vmean:          98.500 cm/s AV VTI:            0.276 m AV Peak Grad:      8.5 mmHg AV Mean Grad:      5.0 mmHg LVOT Vmax:         69.50 cm/s LVOT Vmean:        43.200 cm/s LVOT VTI:          0.116 m LVOT/AV VTI ratio: 0.42  AORTA Ao Root diam: 3.60 cm Ao Asc diam:  3.50 cm MITRAL VALVE                TRICUSPID VALVE MV Area (PHT): 5.38 cm     TR Peak grad:   33.9 mmHg MV Decel Time: 141 msec     TR Vmax:        291.00 cm/s MV E velocity: 110.00 cm/s MV A velocity: 78.50 cm/s   SHUNTS MV E/A ratio:  1.40         Systemic VTI:  0.12 m                             Systemic Diam: 2.00 cm Gwyndolyn Kaufman MD Electronically signed by Gwyndolyn Kaufman MD Signature Date/Time: 02/04/2022/1:39:17 PM    Final      Patient Profile     71 y.o. male with past medical history of chronic combined systolic/diastolic congestive heart failure, polycythemia vera, hemochromatosis, nonischemic cardiomyopathy, history of pacemaker, hypertension for evaluation of acute on chronic combined systolic/diastolic congestive heart failure.  Echocardiogram this admission shows ejection fraction 09%, grade 2 diastolic dysfunction, biatrial enlargement, mild mitral regurgitation.  Venous  Dopplers this admission showed no DVT.  Assessment & Plan    1 acute on chronic combined systolic/diastolic congestive heart failure-Wt 114.9 kg; I/O- -1590.  Patient symptoms are improving.  We will continue Lasix 40 mg IV twice daily.  Continue to follow renal function.  I have not added an SGLT2 inhibitor due to obesity/pannus/use of Hydrea with risk of infection.  Resume spironolactone 12.5 mg daily.  2 nonischemic cardiomyopathy-we will resume losartan 25 mg daily.  He previously did not tolerate Entresto.  Continue low-dose carvedilol and advance as tolerated.  3 history of pacemaker  4 hypertension-patient's blood pressure is controlled.  Continue present medications.  5 hyperlipidemia-continue statin.  For questions or updates, please contact [place] Please consult [email or web address] for contact info under        Signed, Kirk Ruths, MD  02/06/2022, 8:04 AM

## 2022-02-06 NOTE — Plan of Care (Signed)
  Problem: Clinical Measurements: Goal: Diagnostic test results will improve Outcome: Progressing   Problem: Activity: Goal: Risk for activity intolerance will decrease Outcome: Progressing   Problem: Coping: Goal: Level of anxiety will decrease Outcome: Progressing   

## 2022-02-06 NOTE — Plan of Care (Signed)
  Problem: Clinical Measurements: Goal: Will remain free from infection Outcome: Progressing Goal: Respiratory complications will improve Outcome: Progressing Goal: Cardiovascular complication will be avoided Outcome: Progressing   Problem: Activity: Goal: Risk for activity intolerance will decrease Outcome: Progressing

## 2022-02-06 NOTE — Progress Notes (Signed)
Heart Failure Nurse Navigator Progress Note  PCP: Antony Contras, MD PCP-Cardiologist: Stanford Breed Admission Diagnosis: Acute respiratory failure with hypoxia Admitted from: [place] to [facility]  Presentation:   William Villanueva presented with shortness of breath and cough that has worsened over the last 3 weeks, weight gain, has had to take double his lasix, however his symptoms continued, and swelling to his lower legs. Was found at urgent care to be hypoxic, was sent to ED. BP 117/63, HR 81, On 3L O2, BNP 643, IV lasix 40 mg given,  CXR showed edema, DVT study was negative, PE scan negative for PE.   Patient and wife were educated on the sign and symptoms of heart failure, daily weights, when to call his doctor or go to the ED, Diet/ fluid restrictions ( wife stated he is very hard to cook for because he really loves seafood and doesn't really like any vegetables. We spoke about the importance of taking all medications as prescribed and attending all doctor appointments, Both patient and wife verbalized their understanding of education, a hospital HF TOC appointment was scheduled for 02/17/2022 @ 11 am.   ECHO/ LVEF: 40%  Clinical Course:  Past Medical History:  Diagnosis Date   AV block, 2nd degree 2015   [employer] Allure Quadra pulse generator [id number], model PM 3242   Back pain    Cardiomyopathy ([place])    Nonischemic 45%.    CHF (congestive heart failure) ([place])    Gout    Hemochromatosis    Hypertension    Hypospadias December 31, 1950   born with   Nephrolithiasis    Polycythemia vera(238.4)      Social History   Socioeconomic History   Marital status: Married    Spouse name: William Villanueva   Number of children: 0   Years of education: Not on file   Highest education level: High school graduate  Occupational History   Occupation: Retired  Tobacco Use   Smoking status: Former    Packs/day: 1.00    Years: 44.00    Total pack years: 44.00    Types: Cigarettes    Start date: 06/05/1968     Quit date: 04/05/2013    Years since quitting: 8.8   Smokeless tobacco: Never   Tobacco comments:    quit in 2014  Vaping Use   Vaping Use: Never used  Substance and Sexual Activity   Alcohol use: Yes    Alcohol/week: 0.0 standard drinks of alcohol    Comment: rare   Drug use: No   Sexual activity: Not Currently  Other Topics Concern   Not on file  Social History Narrative   Lives at home with wife in a one story home.  Has no children.  Does not work.  Getting workman's comp.  Education: 4 years trade school.    Social Determinants of Health   Financial Resource Strain: Low Risk  (02/06/2022)   Overall Financial Resource Strain (CARDIA)    Difficulty of Paying Living Expenses: Not very hard  Food Insecurity: No Food Insecurity (02/06/2022)   Hunger Vital Sign    Worried About Running Out of Food in the Last Year: Never true    Ran Out of Food in the Last Year: Never true  Transportation Needs: No Transportation Needs (02/06/2022)   PRAPARE - Hydrologist (Medical): No    Lack of Transportation (Non-Medical): No  Physical Activity: Not on file  Stress: Not on file  Social Connections: Not on file  Education Assessment and Provision:  Detailed education and instructions provided on heart failure disease management including the following:  Signs and symptoms of Heart Failure When to call the physician Importance of daily weights Low sodium diet Fluid restriction Medication management Anticipated future follow-up appointments  Patient education given on each of the above topics.  Patient acknowledges understanding via teach back method and acceptance of all instructions.  Education Materials:  "Living Better With Heart Failure" Booklet, HF zone tool, & Daily Weight Tracker Tool.  Patient has scale at home: Yes Patient has pill box at home: Yes    High Risk Criteria for Readmission and/or Poor Patient Outcomes: Heart failure hospital  admissions (last 6 months): 0  No Show rate: 5% Difficult social situation: No Demonstrates medication adherence: Yes Primary Language: English Literacy level: Reading, writing, and comprehension.   Barriers of Care:   Diet/ fluid restrictions Daily weights  Considerations/Referrals:   Reyesferral made to Heart Failure Pharmacist Stewardship:  Referral made to Heart Failure CSW/NCM TOC: No Referral made to Heart & Vascular TOC clinic: Yes  Items for Follow-up on DC/TOC: Diet/ fluid restrictions Daily weights   Earnestine Leys, BSN, RN Heart Failure Transport planner Only

## 2022-02-06 NOTE — Progress Notes (Signed)
Physical Therapy Treatment Patient Details Name: William Villanueva MRN: [id number] DOB: 02/12/1951 Today's Date: 02/06/2022   History of Present Illness pt is a 71 y/o male admitted 9/29 to the ED with c/o dyspnea.  Pt notes DOE and orthopnea, recent weight gait with peripheral edema.  Furthermore has had cough with hemoptysis.  Working dx of acute on chronic combined systolic/diastolic HF.  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    PT Comments    Pt admitted with above diagnosis. Pt was able to progress ambulation but does have LOB without device.  Pt would ultimately benefit from use of RW (pt stated he has one) but he prefers to use cane and states his house works to use cane. Will need HHPT f/u to monitor O2 saturations and pt is hopeful to get off the O2.   Pt currently with functional limitations due to balance and endurance deficits. Pt will benefit from skilled PT to increase their independence and safety with mobility to allow discharge to the venue listed below.    SATURATION QUALIFICATIONS: (This note is used to comply with regulatory documentation for home oxygen)   Patient Saturations on Room Air at Rest = 87%   Patient Saturations on Room Air while Ambulating = NT as pt desaturates on RA.   Patient Saturations on 2 Liters of oxygen while Ambulating = 88%   Please briefly explain why patient needs home oxygen:Pt desaturates on RA at rest and needed 2LO2 to keep sats >88%.    Recommendations for follow up therapy are one component of a multi-disciplinary discharge planning process, led by the attending physician.  Recommendations may be updated based on patient status, additional functional criteria and insurance authorization.  Follow Up Recommendations  Home health PT (Pt wants to get off O2 at home if possible)     Assistance Recommended at Discharge Set up Supervision/Assistance  Patient can return home with the following     Equipment  Recommendations  None recommended by PT    Recommendations for Other Services       Precautions / Restrictions Precautions Precautions: Fall Restrictions Weight Bearing Restrictions: No     Mobility  Bed Mobility               General bed mobility comments: EOB on arrival    Transfers Overall transfer level: Modified independent                      Ambulation/Gait Ambulation/Gait assistance: Supervision Gait Distance (Feet): 150 Feet Assistive device: None Gait Pattern/deviations: Step-through pattern, Trunk flexed, Wide base of support, Staggering right, Antalgic Gait velocity: slower Gait velocity interpretation: 1.31 - 2.62 ft/sec, indicative of limited community ambulator   General Gait Details: generally steady, though with excessive L w/shift with some contralateral L hip drop, appears antalgic, but pt denies pain today. Pt did have one LOB which PT assisted with.  He states with cane and furniture the [place] wouldnt have happended and it likely wouldnt have as pt states his house is small and he does well holding to stuff.   Pt usually uses a cane which was not present.   Stairs             Wheelchair Mobility    Modified Rankin (Stroke Patients Only)       Balance Overall balance assessment: Mild deficits observed, not formally tested  Cognition Arousal/Alertness: Awake/alert Behavior During Therapy: WFL for tasks assessed/performed Overall Cognitive Status: Within Functional Limits for tasks assessed                                          Exercises      General Comments General comments (skin integrity, edema, etc.): Needed 2LO2 at rest as he desats to 87% on RA at rest and needed 2L with activity.Had nurse secretary order incentive spirometer for pt.      Pertinent Vitals/Pain Pain Assessment Pain Assessment: Faces Faces Pain Scale: Hurts little  more Pain Location: back, right hip and left ankle Pain Descriptors / Indicators: Discomfort, Grimacing, Guarding Pain Intervention(s): Limited activity within patient's tolerance, Monitored during session, Repositioned    Home Living                          Prior Function            PT Goals (current goals can now be found in the care plan section) Acute Rehab PT Goals Patient Stated Goal: home and back to work Progress towards PT goals: Progressing toward goals    Frequency    Min 3X/week      PT Plan Current plan remains appropriate    Co-evaluation              AM-PAC PT "6 Clicks" Mobility   Outcome Measure  Help needed turning from your back to your side while in a flat bed without using bedrails?: None Help needed moving from lying on your back to sitting on the side of a flat bed without using bedrails?: None Help needed moving to and from a bed to a chair (including a wheelchair)?: None Help needed standing up from a chair using your arms (e.g., wheelchair or bedside chair)?: None Help needed to walk in hospital room?: A Little Help needed climbing 3-5 steps with a railing? : A Little 6 Click Score: 22    End of Session Equipment Utilized During Treatment: Oxygen;Gait belt Activity Tolerance: Patient tolerated treatment well;Patient limited by fatigue Patient left: in bed;Other (comment);with call bell/phone within reach (sitting EOB) Nurse Communication: Mobility status PT Visit Diagnosis: Unsteadiness on feet (R26.81);Difficulty in walking, not elsewhere classified (R26.2)     Time: 3710-6269 PT Time Calculation (min) (ACUTE ONLY): 21 min  Charges:  $Gait Training: 8-22 mins                     Brithany Whitworth M,PT Acute Rehab Services [phone]    Alvira Philips 02/06/2022, 2:57 PM

## 2022-02-06 NOTE — Progress Notes (Signed)
Heart Failure Stewardship Pharmacist Progress Note   PCP: Antony Contras, MD PCP-Cardiologist: Kirk Ruths, MD    HPI:  71 yo M with PMH of polycythemia vera, CHF, HTN, and gout.  Follows with Dr. Stanford Breed with initial dx of CHF in 2013 with EF 35-40% with cath at that time showing mild nonobstructive CAD. cMRI in 2013 showed EF 34% with no hyperenhancement or scar tissue and no evidence of cardiac hemochromatosis. CRT-P placed in 2015. Last ECHO prior to this admission in 06/2020 showed EF 45-50%.   Presented to the ED on 9/29 with shortness of breath x 3 weeks, LE edema, orthopnea, and weight gain. Did not respond to increased lasix dose as outpatient. CXR with mild pulmonary edema and trace bilateral pleural effusions. CTA negative for PE. ECHO 9/30 showed LVEF 40%, G2DD, RV normal, mildly elevated PA pressures, and mild MR.   Current HF Medications: Diuretic: furosemide 40 mg IV BID Beta Blocker: carvedilol 3.125 mg BID ACE/ARB/ARNI: losartan 25 mg daily MRA: spironolactone 12.5 mg daily  Prior to admission HF Medications: Diuretic: furosemide 40 mg daily Beta blocker: carvedilol 3.125 mg BID ACE/ARB/ARNI: losartan 12.5 mg daily MRA: spironolactone 12.5 mg daily  Pertinent Lab Values: Serum creatinine 1.45, BUN 18, Potassium 3.7, Sodium 138, BNP 634>450.8, Magnesium 1.8, A1c 5.4   Vital Signs: Weight: 253 lbs (admission weight: 255 lbs) Blood pressure: 110/70s  Heart rate: 70-80s  I/O: -1.2L yesterday; net -1.3L  Medication Assistance / Insurance Benefits Check: Does the patient have prescription insurance?  Yes Type of insurance plan: [facility] Medicare   Outpatient Pharmacy:  Prior to admission [facility] Is the patient willing to use [place] pharmacy at discharge? Yes Is the patient willing to transition their outpatient pharmacy to utilize a [facility] outpatient pharmacy?   Pending    Assessment: 1. Acute on chronic systolic CHF (LVEF 47%),  due to NICM. NYHA class III symptoms. - Continue furosemide 40 mg IV BID. Strict I/Os and daily weights. KCl 40 mEq x 1 and Magnesium 2g IV x 1 given for replacement. Keep K>4 and Mag>2. - Agree with adding carvedilol 3.125 mg BID - Continue losartan 25 mg daily. Stopped Entresto in 2022 because of hypotension. - Agree with adding spironolactone 12.5 mg daily - SGLT2i not added due to use of Hydrea and pannus/risk of infection   Plan: 1) Medication changes recommended at this time: - Agree with changes as above - Recheck magnesium tomorrow AM  2) Patient assistance: - None pending  3)  Education  - Patient has been educated on current HF medications and potential additions to HF medication regimen - Patient verbalizes understanding that over the next few months, these medication doses may change and more medications may be added to optimize HF regimen - Patient has been educated on basic disease state pathophysiology and goals of therapy   Kerby Nora, PharmD, BCPS Heart Failure Stewardship Pharmacist Phone [phone]

## 2022-02-06 NOTE — Progress Notes (Signed)
SATURATION QUALIFICATIONS: (This note is used to comply with regulatory documentation for home oxygen)  Patient Saturations on Room Air at Rest = 87%  Patient Saturations on Room Air while Ambulating = NT as pt desaturates on RA.  Patient Saturations on 2 Liters of oxygen while Ambulating = 88%  Please briefly explain why patient needs home oxygen:Pt desaturates on RA at rest and needed 2LO2 to keep sats >88%. William Villanueva M,PT Acute Rehab Services [phone]

## 2022-02-06 NOTE — Progress Notes (Signed)
Occupational Therapy Treatment Patient Details Name: William Villanueva MRN: [id number] DOB: [birth year] Today's Date: 02/06/2022   History of present illness pt is a 71 y/o male admitted 9/29 to the ED with c/o dyspnea.  Pt notes DOE and orthopnea, recent weight gait with peripheral edema.  Furthermore has had cough with hemoptysis.  Working dx of acute on chronic combined systolic/diastolic HF.  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   OT comments  Pt is making great progress towards his acute OT goals. He completed grooming, bathing, toilet and dressing with general supervision. No LOB or safety concerns noted. Pt was on 2L [place] for the majority of the session with SpO2 >92%. [place] removed for toileting, increased WOB noted but sat was 92%. OT to continue to follow acutely. D/c recommendation remains appropriate.    Recommendations for follow up therapy are one component of a multi-disciplinary discharge planning process, led by the attending physician.  Recommendations may be updated based on patient status, additional functional criteria and insurance authorization.    Follow Up Recommendations  No OT follow up    Assistance Recommended at Discharge PRN  Patient can return home with the following  Assistance with cooking/housework   Equipment Recommendations  None recommended by OT       Precautions / Restrictions Precautions Precautions: Fall Restrictions Weight Bearing Restrictions: No       Mobility Bed Mobility Overal bed mobility: Independent                  Transfers Overall transfer level: Modified independent                       Balance Overall balance assessment: Mild deficits observed, not formally tested                                         ADL either performed or assessed with clinical judgement   ADL Overall ADL's : Needs assistance/impaired     Grooming: Supervision/safety;Standing   Upper  Body Bathing: Supervision/ safety;Standing   Lower Body Bathing: Supervison/ safety;Sit to/from stand   Upper Body Dressing : Set up;Sitting   Lower Body Dressing: Supervision/safety;Sit to/from stand   Toilet Transfer: Magazine features editor Details (indicate cue type and reason): removed 2L [place] for toileting, increased WOB but SpO2 >92%           General ADL Comments: Generalized supervision given for safety only. Maintained 2L with SpO2 >92%    Extremity/Trunk Assessment Upper Extremity Assessment Upper Extremity Assessment: Overall WFL for tasks assessed   Lower Extremity Assessment Lower Extremity Assessment: Overall WFL for tasks assessed        Vision   Vision Assessment?: No apparent visual deficits   Perception Perception Perception: Not tested   Praxis Praxis Praxis: Not tested    Cognition Arousal/Alertness: Awake/alert Behavior During Therapy: WFL for tasks assessed/performed Overall Cognitive Status: Within Functional Limits for tasks assessed                    General Comments 2L with goot sat, removed ~2 minutes for toileting pt maintained >90%    Pertinent Vitals/ Pain       Pain Assessment Pain Assessment: Faces Faces Pain Scale: No hurt Pain Intervention(s): Monitored during session   Frequency  Min 2X/week  Progress Toward Goals  OT Goals(current goals can now be found in the care plan section)  Progress towards OT goals: Progressing toward goals     Plan Discharge plan remains appropriate       AM-PAC OT "6 Clicks" Daily Activity     Outcome Measure   Help from another person eating meals?: None Help from another person taking care of personal grooming?: None Help from another person toileting, which includes using toliet, bedpan, or urinal?: None Help from another person bathing (including washing, rinsing, drying)?: None Help from another person to put on and taking off regular upper body clothing?:  None Help from another person to put on and taking off regular lower body clothing?: None 6 Click Score: 24    End of Session Equipment Utilized During Treatment: Oxygen      Activity Tolerance Patient tolerated treatment well   Patient Left in bed;with call bell/phone within reach   Nurse Communication Mobility status        Time: 5573-2202 OT Time Calculation (min): 28 min  Charges: OT General Charges $OT Visit: 1 Visit OT Treatments $Self Care/Home Management : 23-37 mins    Elliot Cousin 02/06/2022, 1:35 PM

## 2022-02-06 NOTE — Progress Notes (Signed)
PROGRESS NOTE                                                                                                                                                                                                             Patient Demographics:    William Villanueva, is a 71 y.o. male, DOB - Jul 15, 1950, UYQ:[id number]  Outpatient Primary MD for the patient is Antony Contras, MD    LOS - 2  Admit date - 02/03/2022    No chief complaint on file.      Brief Narrative (HPI from H&P)   71 y.o. 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  He presented to the hospital with progressive shortness of breath, unexplained weight gain along with orthopnea, he was diagnosed with acute on chronic CHF and admitted to the hospital for further care.    Subjective:   Patient in bed, appears comfortable, denies any headache, no fever, no chest pain or pressure, improving shortness of breath , no abdominal pain. No new focal weakness.   Assessment  & Plan :   Acute on chronic combined systolic (congestive) and diastolic (congestive) heart failure (HCC) EF now 40% with global hypokinesis and enlarged LV cavity.  He has been started on IV Lasix for diuresis with good improvement, his shortness of breath has improved, his D-dimer was borderline however his CTA and lower extremity venous duplex are negative.    Continue diuresis as tolerated by his renal function and blood pressure, continue low-dose beta-blocker as tolerated by blood pressure, ARB held on 02/05/2022 due to AKI, repeat echo noted, EF has dropped, global hypokinesis with dilated LV cavity, cardiology consulted, of note his mild bump in troponin is likely due to demand mismatch from hypoxia, trend is flat and in non-ACS pattern.  Essential hypertension -  on diuretics and Coreg as tolerated by blood pressure, blood pressure on the softer  side, will monitor closely with developing AKI.  As needed midodrine added.  Polycythemia vera (HCC) - Continue hydrea, HGB 10.2 down from 11 previously.  Keep eye on this during stay.  BPH - on Flomax  Dyslipidemia.  On statin continue.  GERD.  Continue PPI.    AKI.  Still evidence of fluid overload, continue diuresis gently, hold ARB/ACE monitor blood pressure.  DM2 (diabetes mellitus, type 2) ([place]) - ISS  Lab Results  Component Value Date   HGBA1C 5.4 02/04/2022   CBG (last 3)  Recent Labs    02/05/22 1710 02/05/22 2050 02/06/22 0630  GLUCAP 112* 110* 105*        Condition - Fair  Family Communication  :  None  Code Status :  Full  Consults  :  Cards  PUD Prophylaxis : PPI   Procedures  :     TTE - 1. Left ventricular ejection fraction, by estimation, is 40%. The left ventricle has moderately decreased function. The left ventricle demonstrates global hypokinesis. The left ventricular internal cavity size was severely dilated. Left ventricular diastolic parameters are consistent with Grade II diastolic dysfunction (pseudonormalization). Elevated left atrial pressure. The E/e' is 19.8.  2. Right ventricular systolic function is normal. The right ventricular size is mildly enlarged. There is mildly elevated pulmonary artery systolic pressure.  3. Left atrial size was severely dilated.  4. Right atrial size was mildly dilated.  5. The mitral valve is grossly normal. Mild mitral valve regurgitation.  6. The aortic valve is tricuspid. There is mild thickening of the aortic valve. Aortic valve regurgitation is not visualized. Aortic valve sclerosis is present, with no evidence of aortic valve stenosis.  7. The inferior vena cava is dilated in size with >50% respiratory variability, suggesting right atrial pressure of 8 mmHg. Comparison(s): Compared to prior TTE on 06/2020, the LV is now severely dilated with EF ~40% (previously moderately dilated with EF 45-50%).  CTA -  No  evidence of pulmonary emboli. Slight increase in pleural effusions when compared with the prior exam right greater than left. New patchy airspace opacities likely representing early parenchymal infiltrates. Scattered stable lymph nodes in the hila and mediastinum again likely reactive in nature. Aortic Atherosclerosis.  Leg [place] - Non DVT      Disposition Plan  :    Status is: Observation  DVT Prophylaxis  :    enoxaparin (LOVENOX) injection 40 mg Start: 02/04/22 1000 Place TED hose Start: 02/04/22 7412  Lab Results  Component Value Date   PLT 543 (H) 02/06/2022    Diet :  Diet Order             Diet heart healthy/carb modified Room service appropriate? Yes; Fluid consistency: Thin; Fluid restriction: 1500 mL Fluid  Diet effective now                    Inpatient Medications  Scheduled Meds:  allopurinol  300 mg Oral Daily   budesonide  0.5 mg Nebulization BID   carvedilol  3.125 mg Oral BID WC   colestipol  1 g Oral BID   cyanocobalamin  1,000 mcg Oral Daily   dicyclomine  20 mg Oral BID   enoxaparin (LOVENOX) injection  40 mg Subcutaneous Daily   folic acid  1 mg Oral Daily   furosemide  40 mg Intravenous BID   hydroxyurea  1,000 mg Oral Daily   insulin glargine-yfgn  20 Units Subcutaneous Daily   ipratropium-albuterol  3 mL Nebulization BID   losartan  25 mg Oral Daily   magnesium oxide  800 mg Oral BID   oxybutynin  10 mg Oral Daily   pantoprazole  40 mg Oral Daily   rosuvastatin  5 mg Oral Daily   spironolactone  12.5 mg Oral Daily   tamsulosin  0.4 mg Oral BID   Continuous Infusions:   PRN Meds:.acetaminophen,  benzonatate, guaiFENesin-dextromethorphan, ondansetron (ZOFRAN) IV, mouth rinse, zolpidem  Antibiotics  :    Anti-infectives (From admission, onward)    None        Time Spent in minutes  30   Lala Lund M.D on 02/06/2022 at 9:08 AM  To page go to [email or web address]   [facility] -  Office  [phone]  See all Orders from  today for further details    Objective:   Vitals:   02/05/22 1822 02/05/22 2048 02/06/22 0549 02/06/22 0838  BP: 129/64 122/63 108/70   Pulse: 99 98 99 98  Resp:   20 17  Temp: 97.9 F (36.6 C) 97.8 F (36.6 C) (!) 97.5 F (36.4 C)   TempSrc: Oral Oral Oral   SpO2: 90% 94% 92% 94%  Weight:   114.9 kg   Height:        Wt Readings from Last 3 Encounters:  02/06/22 114.9 kg  01/24/22 119.3 kg  12/12/21 120.2 kg     Intake/Output Summary (Last 24 hours) at 02/06/2022 0908 Last data filed at 02/06/2022 0554 Gross per 24 hour  Intake 480 ml  Output 775 ml  Net -295 ml     Physical Exam  Awake Alert, No new F.N deficits, Normal affect [place].AT,PERRAL Supple Neck, No JVD,   Symmetrical Chest wall movement, Good air movement bilaterally, few rales RRR,No Gallops, Rubs or new Murmurs,  +ve B.Sounds, Abd Soft, No tenderness,   No Cyanosis, bilateral TED stockings with 1+ leg edema    Data Review:    CBC Recent Labs  Lab 02/03/22 1314 02/04/22 0536 02/05/22 0259 02/06/22 0100  WBC 7.0 5.7 5.4 5.5  HGB 10.2* 10.1* 9.7* 10.0*  HCT 34.2* 33.4* 30.6* 32.4*  PLT 512* 548* 518* 543*  MCV 111.4* 110.6* 107.7* 108.0*  MCH 33.2 33.4 34.2* 33.3  MCHC 29.8* 30.2 31.7 30.9  RDW 16.1* 16.4* 16.4* 16.5*  LYMPHSABS 0.7  --  0.7 0.6*  MONOABS 0.6  --  0.7 0.5  EOSABS 0.1  --  0.1 0.1  BASOSABS 0.1  --  0.1 0.1    Electrolytes Recent Labs  Lab 02/03/22 1314 02/03/22 2023 02/04/22 0537 02/04/22 0940 02/05/22 0259 02/06/22 0100  NA 139  --   --   --  139 138  K 4.0  --   --   --  4.0 3.7  CL 110  --   --   --  102 103  CO2 24  --   --   --  26 24  GLUCOSE 108*  --   --   --  109* 124*  BUN 16  --   --   --  18 18  CREATININE 1.27*  --   --   --  1.54* 1.45*  CALCIUM 8.5*  --   --   --  8.6* 8.7*  AST 14*  --   --   --  14* 13*  ALT 8  --   --   --  7 8  ALKPHOS 58  --   --   --  58 60  BILITOT 1.0  --   --   --  1.0 0.9  ALBUMIN 3.5  --   --   --  3.3* 3.4*   MG  --   --   --  2.0 1.8 1.8  CRP  --   --   --  7.9* 6.9* 5.0*  DDIMER  --  1.04*  --   --   --   --  PROCALCITON  --   --   --  <0.10 <0.10 <0.10  HGBA1C  --   --  5.4  --   --   --   BNP 634.0*  --   --   --  475.2* 450.8*    ------------------------------------------------------------------------------------------------------------------ Recent Labs    02/06/22 0100  CHOL 70  HDL 26*  LDLCALC 27  TRIG 83  CHOLHDL 2.7   ID Labs Recent Labs  Lab 02/03/22 1314 02/03/22 2023 02/04/22 0536 02/04/22 0940 02/05/22 0259 02/06/22 0100  WBC 7.0  --  5.7  --  5.4 5.5  PLT 512*  --  548*  --  518* 543*  CRP  --   --   --  7.9* 6.9* 5.0*  DDIMER  --  1.04*  --   --   --   --   PROCALCITON  --   --   --  <0.10 <0.10 <0.10  CREATININE 1.27*  --   --   --  1.54* 1.45*    Radiology Reports ECHOCARDIOGRAM COMPLETE  Result Date: 02/04/2022    ECHOCARDIOGRAM REPORT   Patient Name:   William Villanueva Date of Exam: 02/04/2022 Medical Rec #:  [id number]        Height:       70.0 in Accession #:    [id number]       Weight:       263.0 lb Date of Birth:  Sep 11, 1950         BSA:          2.345 m Patient Age:    63 years         BP:           112/68 mmHg Patient Gender: M                HR:           81 bpm. Exam Location:  Inpatient Procedure: 2D Echo, Cardiac Doppler, Color Doppler and Intracardiac            Opacification Agent Indications:    CHF  History:        Patient has prior history of Echocardiogram examinations, most                 recent 06/21/2020. Signs/Symptoms:Shortness of Breath and                 Dyspnea.  Sonographer:    Merrie Roof RDCS Referring Phys: [place]  1. Left ventricular ejection fraction, by estimation, is 40%. The left ventricle has moderately decreased function. The left ventricle demonstrates global hypokinesis. The left ventricular internal cavity size was severely dilated. Left ventricular diastolic parameters are consistent with Grade II  diastolic dysfunction (pseudonormalization). Elevated left atrial pressure. The E/e' is 19.8.  2. Right ventricular systolic function is normal. The right ventricular size is mildly enlarged. There is mildly elevated pulmonary artery systolic pressure.  3. Left atrial size was severely dilated.  4. Right atrial size was mildly dilated.  5. The mitral valve is grossly normal. Mild mitral valve regurgitation.  6. The aortic valve is tricuspid. There is mild thickening of the aortic valve. Aortic valve regurgitation is not visualized. Aortic valve sclerosis is present, with no evidence of aortic valve stenosis.  7. The inferior vena cava is dilated in size with >50% respiratory variability, suggesting right atrial pressure of 8 mmHg. Comparison(s): Compared to prior TTE on 06/2020, the LV is now  severely dilated with EF ~40% (previously moderately dilated with EF 45-50%). FINDINGS  Left Ventricle: Left ventricular ejection fraction, by estimation, is 40%. The left ventricle has moderately decreased function. The left ventricle demonstrates global hypokinesis. Definity contrast agent was given IV to delineate the left ventricular endocardial borders. The left ventricular internal cavity size was severely dilated. There is no left ventricular hypertrophy. Left ventricular diastolic parameters are consistent with Grade II diastolic dysfunction (pseudonormalization). Elevated left atrial pressure. The E/e' is 19.8. Right Ventricle: The right ventricular size is mildly enlarged. No increase in right ventricular wall thickness. Right ventricular systolic function is normal. There is mildly elevated pulmonary artery systolic pressure. The tricuspid regurgitant velocity is 2.91 m/s, and with an assumed right atrial pressure of 8 mmHg, the estimated right ventricular systolic pressure is 11.9 mmHg. Left Atrium: Left atrial size was severely dilated. Right Atrium: Right atrial size was mildly dilated. Pericardium: There is no  evidence of pericardial effusion. Mitral Valve: The mitral valve is grossly normal. There is mild thickening of the mitral valve leaflet(s). There is mild calcification of the mitral valve leaflet(s). Mild mitral valve regurgitation. Tricuspid Valve: The tricuspid valve is normal in structure. Tricuspid valve regurgitation is trivial. Aortic Valve: The aortic valve is tricuspid. There is mild thickening of the aortic valve. Aortic valve regurgitation is not visualized. Aortic valve sclerosis is present, with no evidence of aortic valve stenosis. Aortic valve mean gradient measures 5.0  mmHg. Aortic valve peak gradient measures 8.5 mmHg. Aortic valve area, by VTI measures 1.32 cm. Pulmonic Valve: The pulmonic valve was normal in structure. Pulmonic valve regurgitation is not visualized. Aorta: The aortic root and ascending aorta are structurally normal, with no evidence of dilitation. Venous: The inferior vena cava is dilated in size with greater than 50% respiratory variability, suggesting right atrial pressure of 8 mmHg. IAS/Shunts: The atrial septum is grossly normal. Additional Comments: A device lead is visualized.  LEFT VENTRICLE PLAX 2D LVIDd:         6.90 cm      Diastology LVIDs:         5.70 cm      LV e' medial:    7.18 cm/s LV PW:         1.00 cm      LV E/e' medial:  15.3 LV IVS:        0.90 cm      LV e' lateral:   5.55 cm/s LVOT diam:     2.00 cm      LV E/e' lateral: 19.8 LV SV:         36 LV SV Index:   16 LVOT Area:     3.14 cm  LV Volumes (MOD) LV vol d, MOD A2C: 283.0 ml LV vol d, MOD A4C: 305.0 ml LV vol s, MOD A2C: 202.5 ml LV vol s, MOD A4C: 168.0 ml LV SV MOD A2C:     80.5 ml LV SV MOD A4C:     305.0 ml LV SV MOD BP:      115.5 ml RIGHT VENTRICLE             IVC RV Basal diam:  4.70 cm     IVC diam: 2.60 cm RV Mid diam:    3.70 cm RV S prime:     11.60 cm/s TAPSE (M-mode): 2.8 cm LEFT ATRIUM              Index  RIGHT ATRIUM           Index LA diam:        5.30 cm  2.26 cm/m   RA Area:      24.10 cm LA Vol (A2C):   92.7 ml  39.53 ml/m  RA Volume:   84.20 ml  35.91 ml/m LA Vol (A4C):   117.0 ml 49.89 ml/m LA Biplane Vol: 113.0 ml 48.19 ml/m  AORTIC VALVE AV Area (Vmax):    1.50 cm AV Area (Vmean):   1.38 cm AV Area (VTI):     1.32 cm AV Vmax:           146.00 cm/s AV Vmean:          98.500 cm/s AV VTI:            0.276 m AV Peak Grad:      8.5 mmHg AV Mean Grad:      5.0 mmHg LVOT Vmax:         69.50 cm/s LVOT Vmean:        43.200 cm/s LVOT VTI:          0.116 m LVOT/AV VTI ratio: 0.42  AORTA Ao Root diam: 3.60 cm Ao Asc diam:  3.50 cm MITRAL VALVE                TRICUSPID VALVE MV Area (PHT): 5.38 cm     TR Peak grad:   33.9 mmHg MV Decel Time: 141 msec     TR Vmax:        291.00 cm/s MV E velocity: 110.00 cm/s MV A velocity: 78.50 cm/s   SHUNTS MV E/A ratio:  1.40         Systemic VTI:  0.12 m                             Systemic Diam: 2.00 cm Gwyndolyn Kaufman MD Electronically signed by Gwyndolyn Kaufman MD Signature Date/Time: 02/04/2022/1:39:17 PM    Final    VAS [place] LOWER EXTREMITY VENOUS (DVT) (7a-7p)  Result Date: 02/04/2022  Lower Venous DVT Study Patient Name:  William Villanueva  Date of Exam:   02/03/2022 Medical Rec #: [id number]         Accession #:    [id number] Date of Birth: 08/06/1950          Patient Gender: M Patient Age:   33 years Exam Location:  [facility] Procedure:      VAS [place] LOWER EXTREMITY VENOUS (DVT) Referring Phys: Ova Freshwater FONDAW --------------------------------------------------------------------------------  Indications: Swelling.  Risk Factors: None identified. Comparison Study: No prior studies. Performing Technologist: Oliver Hum RVT  Examination Guidelines: A complete evaluation includes B-mode imaging, spectral Doppler, color Doppler, and power Doppler as needed of all accessible portions of each vessel. Bilateral testing is considered an integral part of a complete examination. Limited examinations for reoccurring indications may be performed  as noted. The reflux portion of the exam is performed with the patient in reverse Trendelenburg.  +-----+---------------+---------+-----------+----------+--------------+ RIGHTCompressibilityPhasicitySpontaneityPropertiesThrombus Aging +-----+---------------+---------+-----------+----------+--------------+ CFV  Full           Yes      Yes                                 +-----+---------------+---------+-----------+----------+--------------+   +---------+---------------+---------+-----------+----------+--------------+ LEFT     CompressibilityPhasicitySpontaneityPropertiesThrombus Aging +---------+---------------+---------+-----------+----------+--------------+ CFV      Full  Yes      Yes                                 +---------+---------------+---------+-----------+----------+--------------+ SFJ      Full                                                        +---------+---------------+---------+-----------+----------+--------------+ FV Prox  Full                                                        +---------+---------------+---------+-----------+----------+--------------+ FV Mid   Full                                                        +---------+---------------+---------+-----------+----------+--------------+ FV DistalFull                                                        +---------+---------------+---------+-----------+----------+--------------+ PFV      Full                                                        +---------+---------------+---------+-----------+----------+--------------+ POP      Full           Yes      Yes                                 +---------+---------------+---------+-----------+----------+--------------+ PTV      Full                                                        +---------+---------------+---------+-----------+----------+--------------+ PERO     Full                                                         +---------+---------------+---------+-----------+----------+--------------+    Summary: RIGHT: - No evidence of common femoral vein obstruction.  LEFT: - There is no evidence of deep vein thrombosis in the lower extremity.  - No cystic structure found in the popliteal fossa.  *See table(s) above for measurements and observations. Electronically signed by Deitra Mayo MD on 02/04/2022 at 6:14:44 AM.    Final    CT Angio Chest PE W/Cm &/Or Wo Cm  Result Date: 02/03/2022 CLINICAL DATA:  Cough and shortness of breath for several weeks EXAM: CT ANGIOGRAPHY CHEST WITH CONTRAST TECHNIQUE: Multidetector CT imaging of the chest was performed using the standard protocol during bolus administration of intravenous contrast. Multiplanar CT image reconstructions and MIPs were obtained to evaluate the vascular anatomy. RADIATION DOSE REDUCTION: This exam was performed according to the departmental dose-optimization program which includes automated exposure control, adjustment of the mA and/or kV according to patient size and/or use of iterative reconstruction technique. CONTRAST:  68m OMNIPAQUE IOHEXOL 350 MG/ML SOLN COMPARISON:  Chest x-ray from earlier in the same day, CT from 01/05/2022. FINDINGS: Cardiovascular: Atherosclerotic calcifications of the aorta are and its branches are seen although the degree of enhancement is minimal. No cardiac enlargement is noted. Pacing device is again seen. Pulmonary artery shows a normal branching pattern. No filling defect to suggest pulmonary embolism is noted. Scattered coronary calcifications are noted as well. Mediastinum/Nodes: Thoracic inlet is within normal limits. Right paratracheal and scattered hilar nodes are again seen stable from the prior exam felt to be reactive in nature. The esophagus as visualized is within normal limits. Lungs/Pleura: Small effusions are noted bilaterally. These of increased somewhat in the interval from the prior  exam right greater than left similar to the prior study. Lungs demonstrates some patchy airspace opacities are noted consistent with multifocal pneumonia. These have increased from the prior exam. Upper Abdomen: Visualized upper abdomen is within normal limits. Musculoskeletal: Degenerative changes of the thoracic spine are noted. No acute rib abnormality is seen. Review of the MIP images confirms the above findings. IMPRESSION: No evidence of pulmonary emboli. Slight increase in pleural effusions when compared with the prior exam right greater than left. New patchy airspace opacities likely representing early parenchymal infiltrates. Scattered stable lymph nodes in the hila and mediastinum again likely reactive in nature. Aortic Atherosclerosis (ICD10-I70.0). Electronically Signed   By: MInez CatalinaM.D.   On: 02/03/2022 23:05   DG Chest 2 View  Result Date: 02/03/2022 CLINICAL DATA:  Dyspnea EXAM: CHEST - 2 VIEW COMPARISON:  12/12/2021 chest radiograph. FINDINGS: Three lead left subclavian pacemaker is stable in configuration. Stable cardiomediastinal silhouette with normal heart size. No pneumothorax. Trace bilateral pleural effusions. Mild diffuse prominence of the parahilar interstitial markings. Streaky mild bibasilar atelectasis. IMPRESSION: 1. Mild diffuse prominence of the parahilar interstitial markings, favor mild pulmonary edema. 2. Trace bilateral pleural effusions. 3. Streaky mild bibasilar atelectasis. Electronically Signed   By: JIlona SorrelM.D.   On: 02/03/2022 13:50

## 2022-02-07 DIAGNOSIS — I5043 Acute on chronic combined systolic (congestive) and diastolic (congestive) heart failure: Secondary | ICD-10-CM | POA: Diagnosis not present

## 2022-02-07 LAB — CBC WITH DIFFERENTIAL/PLATELET
Abs Immature Granulocytes: 0.09 10*3/uL — ABNORMAL HIGH (ref 0.00–0.07)
Basophils Absolute: 0.2 10*3/uL — ABNORMAL HIGH (ref 0.0–0.1)
Basophils Relative: 3 %
Eosinophils Absolute: 0.1 10*3/uL (ref 0.0–0.5)
Eosinophils Relative: 1 %
HCT: 33.5 % — ABNORMAL LOW (ref 39.0–52.0)
Hemoglobin: 10.4 g/dL — ABNORMAL LOW (ref 13.0–17.0)
Immature Granulocytes: 1 %
Lymphocytes Relative: 10 %
Lymphs Abs: 0.7 10*3/uL (ref 0.7–4.0)
MCH: 33.2 pg (ref 26.0–34.0)
MCHC: 31 g/dL (ref 30.0–36.0)
MCV: 107 fL — ABNORMAL HIGH (ref 80.0–100.0)
Monocytes Absolute: 0.8 10*3/uL (ref 0.1–1.0)
Monocytes Relative: 13 %
Neutro Abs: 4.5 10*3/uL (ref 1.7–7.7)
Neutrophils Relative %: 72 %
Platelets: 578 10*3/uL — ABNORMAL HIGH (ref 150–400)
RBC: 3.13 MIL/uL — ABNORMAL LOW (ref 4.22–5.81)
RDW: 16.6 % — ABNORMAL HIGH (ref 11.5–15.5)
WBC: 6.3 10*3/uL (ref 4.0–10.5)
nRBC: 0.3 % — ABNORMAL HIGH (ref 0.0–0.2)

## 2022-02-07 LAB — GLUCOSE, CAPILLARY
Glucose-Capillary: 90 mg/dL (ref 70–99)
Glucose-Capillary: 99 mg/dL (ref 70–99)
Glucose-Capillary: 86 mg/dL (ref 70–99)
Glucose-Capillary: 124 mg/dL — ABNORMAL HIGH (ref 70–99)

## 2022-02-07 LAB — MAGNESIUM: Magnesium: 2.1 mg/dL (ref 1.7–2.4)

## 2022-02-07 LAB — COMPREHENSIVE METABOLIC PANEL
ALT: 10 U/L (ref 0–44)
AST: 18 U/L (ref 15–41)
Albumin: 3.5 g/dL (ref 3.5–5.0)
Alkaline Phosphatase: 62 U/L (ref 38–126)
Anion gap: 13 (ref 5–15)
BUN: 20 mg/dL (ref 8–23)
CO2: 26 mmol/L (ref 22–32)
Calcium: 9.2 mg/dL (ref 8.9–10.3)
Chloride: 99 mmol/L (ref 98–111)
Creatinine, Ser: 1.49 mg/dL — ABNORMAL HIGH (ref 0.61–1.24)
GFR, Estimated: 50 mL/min — ABNORMAL LOW (ref >60)
Glucose, Bld: 92 mg/dL (ref 70–99)
Potassium: 4.5 mmol/L (ref 3.5–5.1)
Sodium: 138 mmol/L (ref 135–145)
Total Bilirubin: 1.2 mg/dL (ref 0.3–1.2)
Total Protein: 7.2 g/dL (ref 6.5–8.1)

## 2022-02-07 NOTE — Plan of Care (Signed)
  Problem: Clinical Measurements: Goal: Respiratory complications will improve Outcome: Progressing   Problem: Activity: Goal: Risk for activity intolerance will decrease Outcome: Progressing   Problem: Elimination: Goal: Will not experience complications related to urinary retention Outcome: Progressing   Problem: Safety: Goal: Ability to remain free from injury will improve Outcome: Progressing   

## 2022-02-07 NOTE — Research (Cosign Needed)
[facility] lung guidance and interpretation Informed Consent   Subject Name: William Villanueva  Subject met inclusion and exclusion criteria.  The informed consent form, study requirements and expectations were reviewed with the subject and questions and concerns were addressed prior to the signing of the consent form.  The subject verbalized understanding of the trail requirements.  The subject agreed to participate in the [facility]  trial and signed the informed consent.  The informed consent was obtained prior to performance of any protocol-specific procedures for the subject.  A copy of the signed informed consent was given to the subject and a copy was placed in the subject's medical record.   Validation ultrasounds performed  Berneda Rose 02/07/2022, 3:32 PM

## 2022-02-07 NOTE — Progress Notes (Signed)
Physical Therapy Treatment Patient Details Name: William Villanueva MRN: [id number] DOB: Sep 21, 1950 Today's Date: 02/07/2022   History of Present Illness Pt is a 71 y/o male admitted 02/03/22 with c/o dyspnea, recent weight gain, peripheral edema, cough. Workup for acute on chronic combined systolic/diastolic HF. PMH includes CHF, HTN, AV block with biventricular PPM.   PT Comments    Pt progressing with mobility. Today's session focused on ambulation for improving strength and activity tolerance; pt moving fairly well with SPC, requiring intermittent seated rest breaks secondary to c/o R hip pain and DOE. Pt hopeful for d/c home tomorrow without supplemental oxygen if possible. Do not feel pt requires follow-up with HHPT services, pt and wife in agreement; d/c recs updated. If pt to remain admitted, will continue to follow acutely to address established goals.  SpO2 86-91% on RA with ambulation    Recommendations for follow up therapy are one component of a multi-disciplinary discharge planning process, led by the attending physician.  Recommendations may be updated based on patient status, additional functional criteria and insurance authorization.  Follow Up Recommendations  No PT follow up     Assistance Recommended at Discharge Set up Supervision/Assistance  Patient can return home with the following A little help with bathing/dressing/bathroom;Assistance with cooking/housework;Assist for transportation   Equipment Recommendations  None recommended by PT    Recommendations for Other Services  Mobility Specialist     Precautions / Restrictions Precautions Precautions: Fall;Other (comment) Precaution Comments: Watch SpO2 (does not wear baseline) Restrictions Weight Bearing Restrictions: No     Mobility  Bed Mobility               General bed mobility comments: sitting EOB on arrival    Transfers Overall transfer level: Modified independent Equipment used: Straight  cane, None                    Ambulation/Gait Ambulation/Gait assistance: Supervision Gait Distance (Feet): 72 Feet (+ 64' + 72') Assistive device: Straight cane Gait Pattern/deviations: Step-through pattern, Trunk flexed, Wide base of support, Antalgic Gait velocity: Decreased     General Gait Details: slow, mostly steady gait with SPC, pt at times reaching to wall/rails/furniture for added UE support; 2x seated rest which pt initial attributes to R hip pain, notes SOB on second rest break; pt with good ability to self-monitor activity pacing. noted stability improved when pt just using cane and not also reaching to rail for added stability. SpO2 down to 86% on RA   Stairs             Wheelchair Mobility    Modified Rankin (Stroke Patients Only)       Balance Overall balance assessment: Needs assistance   Sitting balance-Leahy Scale: Good Sitting balance - Comments: pt able to fix bilateral socks, pt allowing wife to help him don slippers   Standing balance support: No upper extremity supported, During functional activity Standing balance-Leahy Scale: Fair Standing balance comment: can static stand and take steps without UE support, preference for cane                            Cognition Arousal/Alertness: Awake/alert Behavior During Therapy: WFL for tasks assessed/performed Overall Cognitive Status: Within Functional Limits for tasks assessed  Exercises      General Comments General comments (skin integrity, edema, etc.): pt's wife present and supportive, reports no concern with providing necessary assist for return home. cardiology MD present during session, reports likely d/c home tomorrow. SpO2 86-91% on RA. educ pt and wife re: activity recommendations, DME use (pt reports RW will not fit in house), current SpO2, importance of daily weights and vitals monitoring (pt checks BP daily  but does not have pulse oximeter)      Pertinent Vitals/Pain Pain Assessment Pain Assessment: Faces Faces Pain Scale: Hurts a little bit Pain Location: back (chronic), R hip Pain Descriptors / Indicators: Discomfort Pain Intervention(s): Monitored during session    Home Living                          Prior Function            PT Goals (current goals can now be found in the care plan section) Progress towards PT goals: Progressing toward goals    Frequency    Min 3X/week      PT Plan Discharge plan needs to be updated    Co-evaluation              AM-PAC PT "6 Clicks" Mobility   Outcome Measure  Help needed turning from your back to your side while in a flat bed without using bedrails?: None Help needed moving from lying on your back to sitting on the side of a flat bed without using bedrails?: None Help needed moving to and from a bed to a chair (including a wheelchair)?: A Little Help needed standing up from a chair using your arms (e.g., wheelchair or bedside chair)?: None Help needed to walk in hospital room?: A Little Help needed climbing 3-5 steps with a railing? : A Little 6 Click Score: 21    End of Session   Activity Tolerance: Patient tolerated treatment well Patient left: in bed;with call bell/phone within reach;with nursing/sitter in room;with family/visitor present Nurse Communication: Mobility status PT Visit Diagnosis: Unsteadiness on feet (R26.81);Difficulty in walking, not elsewhere classified (R26.2)     Time: 1209-1229 PT Time Calculation (min) (ACUTE ONLY): 20 min  Charges:  $Therapeutic Exercise: 8-22 mins                     Mabeline Caras, PT, DPT Acute Rehabilitation Services  Personal: William Villanueva Rehab Office: [place] 02/07/2022, 1:02 PM

## 2022-02-07 NOTE — Progress Notes (Signed)
PROGRESS NOTE                                                                                                                                                                                                             Patient Demographics:    William Villanueva, is a 71 y.o. male, DOB - 12/30/1950, BWI:[id number]  Outpatient Primary MD for the patient is Antony Contras, MD    LOS - 3  Admit date - 02/03/2022    No chief complaint on file.      Brief Narrative (HPI from H&P)   71 y.o. 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  He presented to the hospital with progressive shortness of breath, unexplained weight gain along with orthopnea, he was diagnosed with acute on chronic CHF and admitted to the hospital for further care.    Subjective:   Patient in bed, appears comfortable, denies any headache, no fever, no chest pain or pressure, improving shortness of breath , no abdominal pain. No new focal weakness.   Assessment  & Plan :   Acute on chronic combined systolic (congestive) and diastolic (congestive) heart failure (HCC) EF now 40% with global hypokinesis and enlarged LV cavity.  He has been started on IV Lasix for diuresis with good improvement, his shortness of breath has improved, his D-dimer was borderline however his CTA and lower extremity venous duplex are negative.    Continue diuresis as tolerated by his renal function and blood pressure, continue low-dose beta-blocker as tolerated by blood pressure, ARB held on 02/05/2022 due to AKI, repeat echo noted, EF has dropped, global hypokinesis with dilated LV cavity, cardiology consulted, of note his mild bump in troponin is likely due to demand mismatch from hypoxia, trend is flat and in non-ACS pattern.  He is gradually improving continue IV diuretics for another 24 to 48 hours, case discussed with cardiology  thereafter if stable discharge home on oral diuretics.   Essential hypertension -  on diuretics and Coreg as tolerated by blood pressure, blood pressure on the softer side, will monitor closely with developing AKI.  As needed midodrine added.  Polycythemia vera (HCC) - Continue hydrea, HGB 10.2 down from 11 previously.  Keep eye on this during stay.  BPH - on Flomax  Dyslipidemia.  On statin continue.  GERD.  Continue PPI.    AKI.  Still evidence of fluid overload, continue diuresis gently, hold ARB/ACE monitor blood pressure.  DM2 (diabetes mellitus, type 2) ([place]) - ISS  Lab Results  Component Value Date   HGBA1C 5.4 02/04/2022   CBG (last 3)  Recent Labs    02/06/22 1638 02/06/22 2109 02/07/22 0629  GLUCAP 124* 115* 90        Condition - Fair  Family Communication  :  None  Code Status :  Full  Consults  :  Cards  PUD Prophylaxis : PPI   Procedures  :     TTE - 1. Left ventricular ejection fraction, by estimation, is 40%. The left ventricle has moderately decreased function. The left ventricle demonstrates global hypokinesis. The left ventricular internal cavity size was severely dilated. Left ventricular diastolic parameters are consistent with Grade II diastolic dysfunction (pseudonormalization). Elevated left atrial pressure. The E/e' is 19.8.  2. Right ventricular systolic function is normal. The right ventricular size is mildly enlarged. There is mildly elevated pulmonary artery systolic pressure.  3. Left atrial size was severely dilated.  4. Right atrial size was mildly dilated.  5. The mitral valve is grossly normal. Mild mitral valve regurgitation.  6. The aortic valve is tricuspid. There is mild thickening of the aortic valve. Aortic valve regurgitation is not visualized. Aortic valve sclerosis is present, with no evidence of aortic valve stenosis.  7. The inferior vena cava is dilated in size with >50% respiratory variability, suggesting right atrial pressure  of 8 mmHg. Comparison(s): Compared to prior TTE on 06/2020, the LV is now severely dilated with EF ~40% (previously moderately dilated with EF 45-50%).  CTA -  No evidence of pulmonary emboli. Slight increase in pleural effusions when compared with the prior exam right greater than left. New patchy airspace opacities likely representing early parenchymal infiltrates. Scattered stable lymph nodes in the hila and mediastinum again likely reactive in nature. Aortic Atherosclerosis.  Leg [place] - Non DVT      Disposition Plan  :    Status is: Observation  DVT Prophylaxis  :    enoxaparin (LOVENOX) injection 40 mg Start: 02/04/22 1000 Place TED hose Start: 02/04/22 8099  Lab Results  Component Value Date   PLT 578 (H) 02/07/2022    Diet :  Diet Order             Diet heart healthy/carb modified Room service appropriate? Yes; Fluid consistency: Thin; Fluid restriction: 1500 mL Fluid  Diet effective now                    Inpatient Medications  Scheduled Meds:  allopurinol  300 mg Oral Daily   budesonide  0.5 mg Nebulization BID   carvedilol  3.125 mg Oral BID WC   colestipol  1 g Oral BID   cyanocobalamin  1,000 mcg Oral Daily   dicyclomine  20 mg Oral BID   enoxaparin (LOVENOX) injection  40 mg Subcutaneous Daily   folic acid  1 mg Oral Daily   furosemide  40 mg Intravenous BID   hydroxyurea  1,000 mg Oral Daily   insulin glargine-yfgn  20 Units Subcutaneous Daily   losartan  25 mg Oral Daily   magnesium oxide  800 mg Oral BID   oxybutynin  10 mg Oral Daily   pantoprazole  40 mg Oral Daily   rosuvastatin  5 mg Oral Daily   spironolactone  12.5  mg Oral Daily   tamsulosin  0.4 mg Oral BID   Continuous Infusions:   PRN Meds:.acetaminophen, benzonatate, guaiFENesin-dextromethorphan, ipratropium-albuterol, ondansetron (ZOFRAN) IV, mouth rinse, zolpidem  Antibiotics  :    Anti-infectives (From admission, onward)    None        Time Spent in minutes   30   Lala Lund M.D on 02/07/2022 at 9:18 AM  To page go to [email or web address]   [facility] -  Office  [phone]  See all Orders from today for further details    Objective:   Vitals:   02/06/22 1950 02/07/22 0433 02/07/22 0800 02/07/22 0841  BP:  116/68 (!) 112/58   Pulse:  79 86 70  Resp:  20 20 18   Temp:  98.5 F (36.9 C) (!) 97.5 F (36.4 C)   TempSrc:  Oral Oral   SpO2: 94% 93%  91%  Weight:  113.2 kg    Height:        Wt Readings from Last 3 Encounters:  02/07/22 113.2 kg  01/24/22 119.3 kg  12/12/21 120.2 kg     Intake/Output Summary (Last 24 hours) at 02/07/2022 0918 Last data filed at 02/07/2022 0800 Gross per 24 hour  Intake 1124 ml  Output 575 ml  Net 549 ml     Physical Exam  Awake Alert, No new F.N deficits, Normal affect [place].AT,PERRAL Supple Neck, No JVD,   Symmetrical Chest wall movement, Good air movement bilaterally, few rales RRR,No Gallops, Rubs or new Murmurs,  +ve B.Sounds, Abd Soft, No tenderness,   bilateral TED stockings with 1+ leg edema   Data Review:    CBC Recent Labs  Lab 02/03/22 1314 02/04/22 0536 02/05/22 0259 02/06/22 0100 02/07/22 0031  WBC 7.0 5.7 5.4 5.5 6.3  HGB 10.2* 10.1* 9.7* 10.0* 10.4*  HCT 34.2* 33.4* 30.6* 32.4* 33.5*  PLT 512* 548* 518* 543* 578*  MCV 111.4* 110.6* 107.7* 108.0* 107.0*  MCH 33.2 33.4 34.2* 33.3 33.2  MCHC 29.8* 30.2 31.7 30.9 31.0  RDW 16.1* 16.4* 16.4* 16.5* 16.6*  LYMPHSABS 0.7  --  0.7 0.6* 0.7  MONOABS 0.6  --  0.7 0.5 0.8  EOSABS 0.1  --  0.1 0.1 0.1  BASOSABS 0.1  --  0.1 0.1 0.2*    Electrolytes  Recent Labs  Lab 02/03/22 1314 02/03/22 2023 02/04/22 0537 02/04/22 0940 02/05/22 0259 02/06/22 0100 02/07/22 0031  NA 139  --   --   --  139 138 138  K 4.0  --   --   --  4.0 3.7 4.5  CL 110  --   --   --  102 103 99  CO2 24  --   --   --  26 24 26   GLUCOSE 108*  --   --   --  109* 124* 92  BUN 16  --   --   --  18 18 20   CREATININE 1.27*  --   --   --   1.54* 1.45* 1.49*  CALCIUM 8.5*  --   --   --  8.6* 8.7* 9.2  AST 14*  --   --   --  14* 13* 18  ALT 8  --   --   --  7 8 10   ALKPHOS 58  --   --   --  58 60 62  BILITOT 1.0  --   --   --  1.0 0.9 1.2  ALBUMIN 3.5  --   --   --  3.3* 3.4* 3.5  MG  --   --   --  2.0 1.8 1.8 2.1  CRP  --   --   --  7.9* 6.9* 5.0*  --   DDIMER  --  1.04*  --   --   --   --   --   PROCALCITON  --   --   --  <0.10 <0.10 <0.10  --   HGBA1C  --   --  5.4  --   --   --   --   BNP 634.0*  --   --   --  475.2* 450.8*  --     ------------------------------------------------------------------------------------------------------------------ Recent Labs    02/06/22 0100  CHOL 70  HDL 26*  LDLCALC 27  TRIG 83  CHOLHDL 2.7   ID Labs Recent Labs  Lab 02/03/22 1314 02/03/22 2023 02/04/22 0536 02/04/22 0940 02/05/22 0259 02/06/22 0100 02/07/22 0031  WBC 7.0  --  5.7  --  5.4 5.5 6.3  PLT 512*  --  548*  --  518* 543* 578*  CRP  --   --   --  7.9* 6.9* 5.0*  --   DDIMER  --  1.04*  --   --   --   --   --   PROCALCITON  --   --   --  <0.10 <0.10 <0.10  --   CREATININE 1.27*  --   --   --  1.54* 1.45* 1.49*    Radiology Reports ECHOCARDIOGRAM COMPLETE  Result Date: 02/04/2022    ECHOCARDIOGRAM REPORT   Patient Name:   William Villanueva Date of Exam: 02/04/2022 Medical Rec #:  [id number]        Height:       70.0 in Accession #:    [id number]       Weight:       263.0 lb Date of Birth:  June 07, 1950         BSA:          2.345 m Patient Age:    24 years         BP:           112/68 mmHg Patient Gender: M                HR:           81 bpm. Exam Location:  Inpatient Procedure: 2D Echo, Cardiac Doppler, Color Doppler and Intracardiac            Opacification Agent Indications:    CHF  History:        Patient has prior history of Echocardiogram examinations, most                 recent 06/21/2020. Signs/Symptoms:Shortness of Breath and                 Dyspnea.  Sonographer:    Merrie Roof RDCS Referring Phys: [place]  1. Left ventricular ejection fraction, by estimation, is 40%. The left ventricle has moderately decreased function. The left ventricle demonstrates global hypokinesis. The left ventricular internal cavity size was severely dilated. Left ventricular diastolic parameters are consistent with Grade II diastolic dysfunction (pseudonormalization). Elevated left atrial pressure. The E/e' is 19.8.  2. Right ventricular systolic function is normal. The right ventricular size is mildly enlarged. There is mildly elevated pulmonary artery systolic pressure.  3. Left atrial size was severely dilated.  4. Right atrial size  was mildly dilated.  5. The mitral valve is grossly normal. Mild mitral valve regurgitation.  6. The aortic valve is tricuspid. There is mild thickening of the aortic valve. Aortic valve regurgitation is not visualized. Aortic valve sclerosis is present, with no evidence of aortic valve stenosis.  7. The inferior vena cava is dilated in size with >50% respiratory variability, suggesting right atrial pressure of 8 mmHg. Comparison(s): Compared to prior TTE on 06/2020, the LV is now severely dilated with EF ~40% (previously moderately dilated with EF 45-50%). FINDINGS  Left Ventricle: Left ventricular ejection fraction, by estimation, is 40%. The left ventricle has moderately decreased function. The left ventricle demonstrates global hypokinesis. Definity contrast agent was given IV to delineate the left ventricular endocardial borders. The left ventricular internal cavity size was severely dilated. There is no left ventricular hypertrophy. Left ventricular diastolic parameters are consistent with Grade II diastolic dysfunction (pseudonormalization). Elevated left atrial pressure. The E/e' is 19.8. Right Ventricle: The right ventricular size is mildly enlarged. No increase in right ventricular wall thickness. Right ventricular systolic function is normal. There is mildly elevated  pulmonary artery systolic pressure. The tricuspid regurgitant velocity is 2.91 m/s, and with an assumed right atrial pressure of 8 mmHg, the estimated right ventricular systolic pressure is 00.7 mmHg. Left Atrium: Left atrial size was severely dilated. Right Atrium: Right atrial size was mildly dilated. Pericardium: There is no evidence of pericardial effusion. Mitral Valve: The mitral valve is grossly normal. There is mild thickening of the mitral valve leaflet(s). There is mild calcification of the mitral valve leaflet(s). Mild mitral valve regurgitation. Tricuspid Valve: The tricuspid valve is normal in structure. Tricuspid valve regurgitation is trivial. Aortic Valve: The aortic valve is tricuspid. There is mild thickening of the aortic valve. Aortic valve regurgitation is not visualized. Aortic valve sclerosis is present, with no evidence of aortic valve stenosis. Aortic valve mean gradient measures 5.0  mmHg. Aortic valve peak gradient measures 8.5 mmHg. Aortic valve area, by VTI measures 1.32 cm. Pulmonic Valve: The pulmonic valve was normal in structure. Pulmonic valve regurgitation is not visualized. Aorta: The aortic root and ascending aorta are structurally normal, with no evidence of dilitation. Venous: The inferior vena cava is dilated in size with greater than 50% respiratory variability, suggesting right atrial pressure of 8 mmHg. IAS/Shunts: The atrial septum is grossly normal. Additional Comments: A device lead is visualized.  LEFT VENTRICLE PLAX 2D LVIDd:         6.90 cm      Diastology LVIDs:         5.70 cm      LV e' medial:    7.18 cm/s LV PW:         1.00 cm      LV E/e' medial:  15.3 LV IVS:        0.90 cm      LV e' lateral:   5.55 cm/s LVOT diam:     2.00 cm      LV E/e' lateral: 19.8 LV SV:         36 LV SV Index:   16 LVOT Area:     3.14 cm  LV Volumes (MOD) LV vol d, MOD A2C: 283.0 ml LV vol d, MOD A4C: 305.0 ml LV vol s, MOD A2C: 202.5 ml LV vol s, MOD A4C: 168.0 ml LV SV MOD A2C:      80.5 ml LV SV MOD A4C:     305.0 ml LV SV MOD  BP:      115.5 ml RIGHT VENTRICLE             IVC RV Basal diam:  4.70 cm     IVC diam: 2.60 cm RV Mid diam:    3.70 cm RV S prime:     11.60 cm/s TAPSE (M-mode): 2.8 cm LEFT ATRIUM              Index        RIGHT ATRIUM           Index LA diam:        5.30 cm  2.26 cm/m   RA Area:     24.10 cm LA Vol (A2C):   92.7 ml  39.53 ml/m  RA Volume:   84.20 ml  35.91 ml/m LA Vol (A4C):   117.0 ml 49.89 ml/m LA Biplane Vol: 113.0 ml 48.19 ml/m  AORTIC VALVE AV Area (Vmax):    1.50 cm AV Area (Vmean):   1.38 cm AV Area (VTI):     1.32 cm AV Vmax:           146.00 cm/s AV Vmean:          98.500 cm/s AV VTI:            0.276 m AV Peak Grad:      8.5 mmHg AV Mean Grad:      5.0 mmHg LVOT Vmax:         69.50 cm/s LVOT Vmean:        43.200 cm/s LVOT VTI:          0.116 m LVOT/AV VTI ratio: 0.42  AORTA Ao Root diam: 3.60 cm Ao Asc diam:  3.50 cm MITRAL VALVE                TRICUSPID VALVE MV Area (PHT): 5.38 cm     TR Peak grad:   33.9 mmHg MV Decel Time: 141 msec     TR Vmax:        291.00 cm/s MV E velocity: 110.00 cm/s MV A velocity: 78.50 cm/s   SHUNTS MV E/A ratio:  1.40         Systemic VTI:  0.12 m                             Systemic Diam: 2.00 cm Gwyndolyn Kaufman MD Electronically signed by Gwyndolyn Kaufman MD Signature Date/Time: 02/04/2022/1:39:17 PM    Final    VAS [place] LOWER EXTREMITY VENOUS (DVT) (7a-7p)  Result Date: 02/04/2022  Lower Venous DVT Study Patient Name:  William Villanueva  Date of Exam:   02/03/2022 Medical Rec #: [id number]         Accession #:    [id number] Date of Birth: 1950/10/17          Patient Gender: M Patient Age:   65 years Exam Location:  [facility] Procedure:      VAS [place] LOWER EXTREMITY VENOUS (DVT) Referring Phys: Ova Freshwater FONDAW --------------------------------------------------------------------------------  Indications: Swelling.  Risk Factors: None identified. Comparison Study: No prior studies. Performing Technologist: Oliver Hum RVT  Examination Guidelines: A complete evaluation includes B-mode imaging, spectral Doppler, color Doppler, and power Doppler as needed of all accessible portions of each vessel. Bilateral testing is considered an integral part of a complete examination. Limited examinations for reoccurring indications may be performed as noted. The reflux portion of the exam is performed with the  patient in reverse Trendelenburg.  +-----+---------------+---------+-----------+----------+--------------+ RIGHTCompressibilityPhasicitySpontaneityPropertiesThrombus Aging +-----+---------------+---------+-----------+----------+--------------+ CFV  Full           Yes      Yes                                 +-----+---------------+---------+-----------+----------+--------------+   +---------+---------------+---------+-----------+----------+--------------+ LEFT     CompressibilityPhasicitySpontaneityPropertiesThrombus Aging +---------+---------------+---------+-----------+----------+--------------+ CFV      Full           Yes      Yes                                 +---------+---------------+---------+-----------+----------+--------------+ SFJ      Full                                                        +---------+---------------+---------+-----------+----------+--------------+ FV Prox  Full                                                        +---------+---------------+---------+-----------+----------+--------------+ FV Mid   Full                                                        +---------+---------------+---------+-----------+----------+--------------+ FV DistalFull                                                        +---------+---------------+---------+-----------+----------+--------------+ PFV      Full                                                        +---------+---------------+---------+-----------+----------+--------------+ POP      Full            Yes      Yes                                 +---------+---------------+---------+-----------+----------+--------------+ PTV      Full                                                        +---------+---------------+---------+-----------+----------+--------------+ PERO     Full                                                        +---------+---------------+---------+-----------+----------+--------------+  Summary: RIGHT: - No evidence of common femoral vein obstruction.  LEFT: - There is no evidence of deep vein thrombosis in the lower extremity.  - No cystic structure found in the popliteal fossa.  *See table(s) above for measurements and observations. Electronically signed by Deitra Mayo MD on 02/04/2022 at 6:14:44 AM.    Final    CT Angio Chest PE W/Cm &/Or Wo Cm  Result Date: 02/03/2022 CLINICAL DATA:  Cough and shortness of breath for several weeks EXAM: CT ANGIOGRAPHY CHEST WITH CONTRAST TECHNIQUE: Multidetector CT imaging of the chest was performed using the standard protocol during bolus administration of intravenous contrast. Multiplanar CT image reconstructions and MIPs were obtained to evaluate the vascular anatomy. RADIATION DOSE REDUCTION: This exam was performed according to the departmental dose-optimization program which includes automated exposure control, adjustment of the mA and/or kV according to patient size and/or use of iterative reconstruction technique. CONTRAST:  63m OMNIPAQUE IOHEXOL 350 MG/ML SOLN COMPARISON:  Chest x-ray from earlier in the same day, CT from 01/05/2022. FINDINGS: Cardiovascular: Atherosclerotic calcifications of the aorta are and its branches are seen although the degree of enhancement is minimal. No cardiac enlargement is noted. Pacing device is again seen. Pulmonary artery shows a normal branching pattern. No filling defect to suggest pulmonary embolism is noted. Scattered coronary calcifications are noted as well.  Mediastinum/Nodes: Thoracic inlet is within normal limits. Right paratracheal and scattered hilar nodes are again seen stable from the prior exam felt to be reactive in nature. The esophagus as visualized is within normal limits. Lungs/Pleura: Small effusions are noted bilaterally. These of increased somewhat in the interval from the prior exam right greater than left similar to the prior study. Lungs demonstrates some patchy airspace opacities are noted consistent with multifocal pneumonia. These have increased from the prior exam. Upper Abdomen: Visualized upper abdomen is within normal limits. Musculoskeletal: Degenerative changes of the thoracic spine are noted. No acute rib abnormality is seen. Review of the MIP images confirms the above findings. IMPRESSION: No evidence of pulmonary emboli. Slight increase in pleural effusions when compared with the prior exam right greater than left. New patchy airspace opacities likely representing early parenchymal infiltrates. Scattered stable lymph nodes in the hila and mediastinum again likely reactive in nature. Aortic Atherosclerosis (ICD10-I70.0). Electronically Signed   By: MInez CatalinaM.D.   On: 02/03/2022 23:05   DG Chest 2 View  Result Date: 02/03/2022 CLINICAL DATA:  Dyspnea EXAM: CHEST - 2 VIEW COMPARISON:  12/12/2021 chest radiograph. FINDINGS: Three lead left subclavian pacemaker is stable in configuration. Stable cardiomediastinal silhouette with normal heart size. No pneumothorax. Trace bilateral pleural effusions. Mild diffuse prominence of the parahilar interstitial markings. Streaky mild bibasilar atelectasis. IMPRESSION: 1. Mild diffuse prominence of the parahilar interstitial markings, favor mild pulmonary edema. 2. Trace bilateral pleural effusions. 3. Streaky mild bibasilar atelectasis. Electronically Signed   By: JIlona SorrelM.D.   On: 02/03/2022 13:50

## 2022-02-07 NOTE — Progress Notes (Signed)
Rounding Note    Patient Name: William Villanueva Date of Encounter: 02/07/2022  [place] Cardiologist: Kirk Ruths, MD   Subjective   Denies CP; dyspnea much improved.  Inpatient Medications    Scheduled Meds:  allopurinol  300 mg Oral Daily   budesonide  0.5 mg Nebulization BID   carvedilol  3.125 mg Oral BID WC   colestipol  1 g Oral BID   cyanocobalamin  1,000 mcg Oral Daily   dicyclomine  20 mg Oral BID   enoxaparin (LOVENOX) injection  40 mg Subcutaneous Daily   folic acid  1 mg Oral Daily   furosemide  40 mg Intravenous BID   hydroxyurea  1,000 mg Oral Daily   insulin glargine-yfgn  20 Units Subcutaneous Daily   losartan  25 mg Oral Daily   magnesium oxide  800 mg Oral BID   oxybutynin  10 mg Oral Daily   pantoprazole  40 mg Oral Daily   rosuvastatin  5 mg Oral Daily   spironolactone  12.5 mg Oral Daily   tamsulosin  0.4 mg Oral BID   Continuous Infusions:  PRN Meds: acetaminophen, benzonatate, guaiFENesin-dextromethorphan, ipratropium-albuterol, ondansetron (ZOFRAN) IV, mouth rinse, zolpidem   Vital Signs    Vitals:   02/07/22 0841 02/07/22 0957 02/07/22 0958 02/07/22 1044  BP:  (!) 85/47 (!) 92/45 110/61  Pulse: 70 80 80 78  Resp: 18   20  Temp:    (!) 97.4 F (36.3 C)  TempSrc:    Oral  SpO2: 91% (!) 89% (!) 89% 92%  Weight:      Height:        Intake/Output Summary (Last 24 hours) at 02/07/2022 1216 Last data filed at 02/07/2022 0800 Gross per 24 hour  Intake 767 ml  Output 575 ml  Net 192 ml       02/07/2022    4:33 AM 02/06/2022    5:49 AM 02/05/2022    3:41 AM  Last 3 Weights  Weight (lbs) 249 lb 8 oz 253 lb 4.8 oz 256 lb 4.8 oz  Weight (kg) 113.172 kg 114.896 kg 116.257 kg      Telemetry    Sinus with ventricular pacing- Personally Reviewed   Physical Exam   GEN: NAD.  Obese Neck: supple, JVP difficult to assess Cardiac: RRR, no rub Respiratory: CTA GI: Soft, obese MS: No edema Neuro:  Grossly  intact Psych: Normal affect   Labs    High Sensitivity Troponin:   Recent Labs  Lab 02/03/22 2023 02/03/22 2036  TROPONINIHS 21* 19*      Chemistry Recent Labs  Lab 02/05/22 0259 02/06/22 0100 02/07/22 0031  NA 139 138 138  K 4.0 3.7 4.5  CL 102 103 99  CO2 26 24 26   GLUCOSE 109* 124* 92  BUN 18 18 20   CREATININE 1.54* 1.45* 1.49*  CALCIUM 8.6* 8.7* 9.2  MG 1.8 1.8 2.1  PROT 6.6 6.8 7.2  ALBUMIN 3.3* 3.4* 3.5  AST 14* 13* 18  ALT 7 8 10   ALKPHOS 58 60 62  BILITOT 1.0 0.9 1.2  GFRNONAA 48* 52* 50*  ANIONGAP 11 11 13      Lipids  Recent Labs  Lab 02/06/22 0100  CHOL 70  TRIG 83  HDL 26*  LDLCALC 27  CHOLHDL 2.7     Hematology Recent Labs  Lab 02/05/22 0259 02/06/22 0100 02/07/22 0031  WBC 5.4 5.5 6.3  RBC 2.84* 3.00* 3.13*  HGB 9.7* 10.0* 10.4*  HCT 30.6*  32.4* 33.5*  MCV 107.7* 108.0* 107.0*  MCH 34.2* 33.3 33.2  MCHC 31.7 30.9 31.0  RDW 16.4* 16.5* 16.6*  PLT 518* 543* 578*    BNP Recent Labs  Lab 02/03/22 1314 02/05/22 0259 02/06/22 0100  BNP 634.0* 475.2* 450.8*     DDimer  Recent Labs  Lab 02/03/22 2023  DDIMER 1.04*      Patient Profile     71 y.o. male with past medical history of chronic combined systolic/diastolic congestive heart failure, polycythemia vera, hemochromatosis, nonischemic cardiomyopathy, history of pacemaker, hypertension for evaluation of acute on chronic combined systolic/diastolic congestive heart failure.  Echocardiogram this admission shows ejection fraction 75%, grade 2 diastolic dysfunction, biatrial enlargement, mild mitral regurgitation.  Venous Dopplers this admission showed no DVT.  Assessment & Plan    1 acute on chronic combined systolic/diastolic congestive heart failure-symptoms continue to improve.  We will continue IV Lasix today and transition to oral regimen tomorrow.  Continue spironolactone.  Follow renal function.  I have not added an SGLT2 inhibitor due to obesity/pannus/use of Hydrea  with risk of infection.   2 nonischemic cardiomyopathy-continue low-dose losartan and carvedilol.  He did not tolerate Entresto due to hypotension previously.  We will advance medications as an outpatient.  3 history of pacemaker  4 hypertension-patient's blood pressure is controlled.  Continue present medications.  5 hyperlipidemia-continue statin.  For questions or updates, please contact [place] Please consult [email or web address] for contact info under        Signed, Kirk Ruths, MD  02/07/2022, 12:16 PM

## 2022-02-07 NOTE — Progress Notes (Signed)
   02/07/22 1600  Mobility  Activity Ambulated with assistance in hallway  Activity Response Tolerated well  Distance Ambulated (ft) 75 ft  $Mobility charge 1 Mobility  Level of Assistance Standby assist, set-up cues, supervision of patient - no hands on  Assistive Device Cane  Mobility Referral Yes   Mobility Specialist Progress Note  Pre-Mobility: 94% SpO2(RA) During Mobility: 86% SpO2(RA) Post-Mobility: 91% SpO2(RA)  Pt was in bed and agreeable. X1 seated break d/t SOB. Recovered by being coached through pursed lip breathing. Returned EOB w/ all needs met and call bell in reach.   Lucious Groves Mobility Specialist

## 2022-02-07 NOTE — Progress Notes (Signed)
Heart Failure Stewardship Pharmacist Progress Note   PCP: Antony Contras, MD PCP-Cardiologist: Kirk Ruths, MD    HPI:  71 yo M with PMH of polycythemia vera, CHF, HTN, and gout.  Follows with Dr. Stanford Breed with initial dx of CHF in 2013 with EF 35-40% with cath at that time showing mild nonobstructive CAD. cMRI in 2013 showed EF 34% with no hyperenhancement or scar tissue and no evidence of cardiac hemochromatosis. CRT-P placed in 2015. Last ECHO prior to this admission in 06/2020 showed EF 45-50%.   Presented to the ED on 9/29 with shortness of breath x 3 weeks, LE edema, orthopnea, and weight gain. Did not respond to increased lasix dose as outpatient. CXR with mild pulmonary edema and trace bilateral pleural effusions. CTA negative for PE. ECHO 9/30 showed LVEF 40%, G2DD, RV normal, mildly elevated PA pressures, and mild MR.   Current HF Medications: Diuretic: furosemide 40 mg IV BID Beta Blocker: carvedilol 3.125 mg BID ACE/ARB/ARNI: losartan 25 mg daily MRA: spironolactone 12.5 mg daily  Prior to admission HF Medications: Diuretic: furosemide 40 mg daily Beta blocker: carvedilol 3.125 mg BID ACE/ARB/ARNI: losartan 12.5 mg daily MRA: spironolactone 12.5 mg daily  Pertinent Lab Values: Serum creatinine 1.49, BUN 20, Potassium 4.5, Sodium 138, BNP 634>450.8, Magnesium 2.1, A1c 5.4   Vital Signs: Weight: 249 lbs (admission weight: 255 lbs) Blood pressure: 110/60s  Heart rate: 70-90s  I/O: -638m yesterday; net -1L  Medication Assistance / Insurance Benefits Check: Does the patient have prescription insurance?  Yes Type of insurance plan: [facility]   Outpatient Pharmacy:  [facility] admission [facility] Yes Is the patient willing to transition their outpatient pharmacy [facility]?   Pending    Assessment: 1. Acute on chronic systolic CHF (LVEF 455%,  due to NICM. NYHA class III symptoms. - Continue furosemide 40 mg IV BID. I/Os seem incomplete. Strict I/Os and daily weights. Keep K>4 and Mag>2. - Continue carvedilol 3.125 mg BID - Continue losartan 25 mg daily. Stopped Entresto in 2022 because of hypotension. - Continue spironolactone 12.5 mg daily - SGLT2i not added due to use of Hydrea and pannus/risk of infection   Plan: 1) Medication changes recommended at this time: - Continue current regimen  2) Patient assistance: - None pending  3)  Education  - Patient has been educated on current HF medications and potential additions to HF medication regimen - Patient verbalizes understanding that over the next few months, these medication doses may change and more medications may be added to optimize HF regimen - Patient has been educated on basic disease state pathophysiology and goals of therapy   MKerby Nora PharmD, BCPS Heart Failure Stewardship Pharmacist Phone ([phone]

## 2022-02-08 ENCOUNTER — Other Ambulatory Visit: Payer: Self-pay | Admitting: Cardiology

## 2022-02-08 ENCOUNTER — Telehealth: Payer: Self-pay

## 2022-02-08 DIAGNOSIS — E669 Obesity, unspecified: Secondary | ICD-10-CM

## 2022-02-08 DIAGNOSIS — E66812 Obesity, class 2: Secondary | ICD-10-CM | POA: Diagnosis present

## 2022-02-08 DIAGNOSIS — I5043 Acute on chronic combined systolic (congestive) and diastolic (congestive) heart failure: Secondary | ICD-10-CM | POA: Diagnosis not present

## 2022-02-08 DIAGNOSIS — N179 Acute kidney failure, unspecified: Secondary | ICD-10-CM | POA: Diagnosis not present

## 2022-02-08 DIAGNOSIS — I5042 Chronic combined systolic (congestive) and diastolic (congestive) heart failure: Secondary | ICD-10-CM

## 2022-02-08 DIAGNOSIS — N189 Chronic kidney disease, unspecified: Secondary | ICD-10-CM

## 2022-02-08 LAB — CBC WITH DIFFERENTIAL/PLATELET
Abs Immature Granulocytes: 0.15 10*3/uL — ABNORMAL HIGH (ref 0.00–0.07)
Basophils Absolute: 0.1 10*3/uL (ref 0.0–0.1)
Basophils Relative: 1 %
Eosinophils Absolute: 0.1 10*3/uL (ref 0.0–0.5)
Eosinophils Relative: 1 %
HCT: 33.4 % — ABNORMAL LOW (ref 39.0–52.0)
Hemoglobin: 10.2 g/dL — ABNORMAL LOW (ref 13.0–17.0)
Immature Granulocytes: 1 %
Lymphocytes Relative: 7 %
Lymphs Abs: 0.7 10*3/uL (ref 0.7–4.0)
MCH: 33 pg (ref 26.0–34.0)
MCHC: 30.5 g/dL (ref 30.0–36.0)
MCV: 108.1 fL — ABNORMAL HIGH (ref 80.0–100.0)
Monocytes Absolute: 1.1 10*3/uL — ABNORMAL HIGH (ref 0.1–1.0)
Monocytes Relative: 11 %
Neutro Abs: 8.2 10*3/uL — ABNORMAL HIGH (ref 1.7–7.7)
Neutrophils Relative %: 79 %
Platelets: 578 10*3/uL — ABNORMAL HIGH (ref 150–400)
RBC: 3.09 MIL/uL — ABNORMAL LOW (ref 4.22–5.81)
RDW: 16.6 % — ABNORMAL HIGH (ref 11.5–15.5)
WBC: 10.4 10*3/uL (ref 4.0–10.5)
nRBC: 0 % (ref 0.0–0.2)

## 2022-02-08 LAB — COMPREHENSIVE METABOLIC PANEL
ALT: 11 U/L (ref 0–44)
AST: 19 U/L (ref 15–41)
Albumin: 3.4 g/dL — ABNORMAL LOW (ref 3.5–5.0)
Alkaline Phosphatase: 59 U/L (ref 38–126)
Anion gap: 12 (ref 5–15)
BUN: 24 mg/dL — ABNORMAL HIGH (ref 8–23)
CO2: 25 mmol/L (ref 22–32)
Calcium: 8.8 mg/dL — ABNORMAL LOW (ref 8.9–10.3)
Chloride: 100 mmol/L (ref 98–111)
Creatinine, Ser: 1.68 mg/dL — ABNORMAL HIGH (ref 0.61–1.24)
GFR, Estimated: 43 mL/min — ABNORMAL LOW (ref >60)
Glucose, Bld: 93 mg/dL (ref 70–99)
Potassium: 4.3 mmol/L (ref 3.5–5.1)
Sodium: 137 mmol/L (ref 135–145)
Total Bilirubin: 1 mg/dL (ref 0.3–1.2)
Total Protein: 6.9 g/dL (ref 6.5–8.1)

## 2022-02-08 LAB — GLUCOSE, CAPILLARY
Glucose-Capillary: 98 mg/dL (ref 70–99)
Glucose-Capillary: 115 mg/dL — ABNORMAL HIGH (ref 70–99)
Glucose-Capillary: 159 mg/dL — ABNORMAL HIGH (ref 70–99)
Glucose-Capillary: 101 mg/dL — ABNORMAL HIGH (ref 70–99)

## 2022-02-08 NOTE — Progress Notes (Signed)
Progress Note   Patient: William Villanueva QPY:[id number] DOB: 1951-02-01 DOA: 02/03/2022     4 DOS: the patient was seen and examined on 02/08/2022   Brief hospital course: Mr. Remmel was admitted to the hospital with the working diagnosis of decompensated heart failure.   71 yo male with the past medical history of PVC, heart failure, hypertension and AV block sp pacemaker, who presented with dyspnea. At home had weight increase and worsening edema, that did not improved completely with increased dose of oral diuretic therapy. On his initial physical examination his blood pressure was 107/44, HR 95, RR 25 and 02 saturation 92%, lungs with rales bilaterally with no wheezing, heart with S1 and S2 present and rhythmic, abdomen with no distention, positive lower extremity edema more right than left.   Na 139, K 4,0 CL 110, bicarbonate 24, glucose 108 bun 16 cr 1,27  BNP 634  Wbc 7,0 hgb 10,2 plt 512  Sars covid 19 and influenza negative   Chest radiograph with cardiomegaly with bilateral hilar vascular congestion, with small bilateral pleural effusions, pacemaker in place in one atrial and two ventricular leads.   EKG 99 bpm, right axis, qtc 598, ventricular paced rhythm, with multifocal PVC, with no significant ST segment or T wave changes.   Patient was placed on furosemide for diuresis with improvement in his symptoms.  Further work up with CT chest and doppler lower extremities negative for deep venous thromboembolism.   Volume status has improved, hospitalization prolonged due to AKI.      Assessment and Plan: * Acute on chronic combined systolic (congestive) and diastolic (congestive) heart failure (HCC) Echocardiogram with reduced LV systolic function 12%, with global hypokinesis. RV systolic function preserved, RVSP 41,9 mmHg, mild RV enlarged cavity, LA with severe dilatation.   Patient has lost 4 kg since admission Systolic blood pressure 458 mmHg.   Continue medical therapy  with carvedilol, losartan and diuresis with spironolactone. Plan to resume loop diuretic therapy in am, after follow up on renal function.    Essential hypertension Cont home coreg, aldactone, losartan  DM2 (diabetes mellitus, type 2) (HCC) Continue home Tresiba daily CBG checks AC/HS Check A1C  Polycythemia vera (HCC) Continue hydrea HGB 10.2 down from 11 previously.  Keep eye on this during stay.  Acute kidney injury superimposed on chronic kidney disease ([place]) CKD stage 3 a  Renal function with serum cr at 1,68, K is 4,3 and serum bicarbonate at 24. Na is 137.   Plan to hold on furosemide and follow up renal function in am Hold on loop diuretic therapy for now.  Avoid hypotension and nephrotoxic medications.   Class 2 obesity Calculated BMI is 35.5         Subjective: Patient with no chest pain or dyspnea,  Physical Exam: Vitals:   02/08/22 0420 02/08/22 0836 02/08/22 0854 02/08/22 1104  BP: 101/74  (!) 109/59 115/82  Pulse: 100 96 (!) 102 89  Resp: 18 18 18 20   Temp: 97.9 F (36.6 C)  98 F (36.7 C) 97.8 F (36.6 C)  TempSrc: Oral  Oral Oral  SpO2: 96%  (!) 88% 91%  Weight: 112.4 kg     Height:       Neurology awake and alert ENT with mild pallor Cardiovascular with S1 and S2 present and rhythmic with no gallops Respiratory with no raled or wheezing Abdomen with no distention  Lower extremities with no edema.  Data Reviewed:    Family Communication: no family at the  bedside   Disposition: Status is: Inpatient Remains inpatient appropriate because: renal failure   Planned Discharge Destination: Home      Author: Tawni Millers, MD 02/08/2022 2:13 PM  For on call review [email or web address].

## 2022-02-08 NOTE — Care Management Important Message (Signed)
Important Message  Patient Details  Name: William Villanueva MRN: [id number] Date of Birth: Jul 14, 1950   Medicare Important Message Given:  Yes     Shelda Altes 02/08/2022, 10:07 AM

## 2022-02-08 NOTE — Progress Notes (Addendum)
Rounding Note    Patient Name: William Villanueva Date of Encounter: 02/08/2022  [place] Cardiologist: Kirk Ruths, MD   Subjective   Patient denies sob, orthopnea, chest pain, ankle edema this AM. He feels like his breathing is back to baseline. Continues to cough  Inpatient Medications    Scheduled Meds:  allopurinol  300 mg Oral Daily   budesonide  0.5 mg Nebulization BID   carvedilol  3.125 mg Oral BID WC   colestipol  1 g Oral BID   cyanocobalamin  1,000 mcg Oral Daily   dicyclomine  20 mg Oral BID   enoxaparin (LOVENOX) injection  40 mg Subcutaneous Daily   folic acid  1 mg Oral Daily   furosemide  40 mg Intravenous BID   hydroxyurea  1,000 mg Oral Daily   insulin glargine-yfgn  20 Units Subcutaneous Daily   losartan  25 mg Oral Daily   magnesium oxide  800 mg Oral BID   oxybutynin  10 mg Oral Daily   pantoprazole  40 mg Oral Daily   rosuvastatin  5 mg Oral Daily   spironolactone  12.5 mg Oral Daily   tamsulosin  0.4 mg Oral BID   Continuous Infusions:  PRN Meds: acetaminophen, benzonatate, guaiFENesin-dextromethorphan, ipratropium-albuterol, ondansetron (ZOFRAN) IV, mouth rinse, zolpidem   Vital Signs    Vitals:   02/07/22 1815 02/07/22 2034 02/07/22 2035 02/08/22 0420  BP: 109/66 120/69  101/74  Pulse:  89  100  Resp: 18 18  18   Temp:  98.9 F (37.2 C)  97.9 F (36.6 C)  TempSrc:  Oral  Oral  SpO2:  97% 97% 96%  Weight:    112.4 kg  Height:        Intake/Output Summary (Last 24 hours) at 02/08/2022 0811 Last data filed at 02/07/2022 2200 Gross per 24 hour  Intake 714 ml  Output 375 ml  Net 339 ml      02/08/2022    4:20 AM 02/07/2022    4:33 AM 02/06/2022    5:49 AM  Last 3 Weights  Weight (lbs) 247 lb 14.4 oz 249 lb 8 oz 253 lb 4.8 oz  Weight (kg) 112.447 kg 113.172 kg 114.896 kg      Telemetry    Paced rhythm, HR in the 100s  - Personally Reviewed  ECG     No new tracings - Personally Reviewed  Physical Exam    GEN: No acute distress. Sitting comfortably on the edge of the bed eating breakfast  Neck: No JVD Cardiac: RRR, no murmurs, rubs, or gallops.  Respiratory: Clear to auscultation bilaterally. Normal WOB on room air  MS: No edema; No deformity. Neuro:  Nonfocal  Psych: Normal affect   Labs    High Sensitivity Troponin:   Recent Labs  Lab 02/03/22 2023 02/03/22 2036  TROPONINIHS 21* 19*     Chemistry Recent Labs  Lab 02/05/22 0259 02/06/22 0100 02/07/22 0031 02/08/22 0040  NA 139 138 138 137  K 4.0 3.7 4.5 4.3  CL 102 103 99 100  CO2 26 24 26 25   GLUCOSE 109* 124* 92 93  BUN 18 18 20  24*  CREATININE 1.54* 1.45* 1.49* 1.68*  CALCIUM 8.6* 8.7* 9.2 8.8*  MG 1.8 1.8 2.1  --   PROT 6.6 6.8 7.2 6.9  ALBUMIN 3.3* 3.4* 3.5 3.4*  AST 14* 13* 18 19  ALT 7 8 10 11   ALKPHOS 58 60 62 59  BILITOT 1.0 0.9 1.2 1.0  GFRNONAA  48* 52* 50* 43*  ANIONGAP 11 11 13 12     Lipids  Recent Labs  Lab 02/06/22 0100  CHOL 70  TRIG 83  HDL 26*  LDLCALC 27  CHOLHDL 2.7    Hematology Recent Labs  Lab 02/06/22 0100 02/07/22 0031 02/08/22 0040  WBC 5.5 6.3 10.4  RBC 3.00* 3.13* 3.09*  HGB 10.0* 10.4* 10.2*  HCT 32.4* 33.5* 33.4*  MCV 108.0* 107.0* 108.1*  MCH 33.3 33.2 33.0  MCHC 30.9 31.0 30.5  RDW 16.5* 16.6* 16.6*  PLT 543* 578* 578*   Thyroid No results for input(s): "TSH", "FREET4" in the last 168 hours.  BNP Recent Labs  Lab 02/03/22 1314 02/05/22 0259 02/06/22 0100  BNP 634.0* 475.2* 450.8*    DDimer  Recent Labs  Lab 02/03/22 2023  DDIMER 1.04*     Radiology    No results found.  Cardiac Studies   Echocardiogram 02/04/22 1. Left ventricular ejection fraction, by estimation, is 40%. The left  ventricle has moderately decreased function. The left ventricle  demonstrates global hypokinesis. The left ventricular internal cavity size  was severely dilated. Left ventricular  diastolic parameters are consistent with Grade II diastolic dysfunction   (pseudonormalization). Elevated left atrial pressure. The E/e' is 19.8.   2. Right ventricular systolic function is normal. The right ventricular  size is mildly enlarged. There is mildly elevated pulmonary artery  systolic pressure.   3. Left atrial size was severely dilated.   4. Right atrial size was mildly dilated.   5. The mitral valve is grossly normal. Mild mitral valve regurgitation.   6. The aortic valve is tricuspid. There is mild thickening of the aortic  valve. Aortic valve regurgitation is not visualized. Aortic valve  sclerosis is present, with no evidence of aortic valve stenosis.   7. The inferior vena cava is dilated in size with >50% respiratory  variability, suggesting right atrial pressure of 8 mmHg.   Comparison(s): Compared to prior TTE on 06/2020, the LV is now severely  dilated with EF ~40% (previously moderately dilated with EF 45-50%).   Patient Profile     71 y.o. male  with past medical history of chronic combined systolic/diastolic congestive heart failure, polycythemia vera, hemochromatosis, nonischemic cardiomyopathy, history of pacemaker, hypertension for evaluation of acute on chronic combined systolic/diastolic congestive heart failure.   Assessment & Plan    Acute on chronic combined systolic and diastolic heart failure  Nonischemic Cardiomyopathy  - Echocardiogram this admission showed EF 16%, grade 2 diastolic dysfunction, biatrial enlargement, mild mitral regurgitation.  - Patient presented complaining of SOB, cough. BNP elevated to 634 on admission  - He has been diuresing on IV lasix 40 mg BID. Weight is down from 255.73 lbs on admission to 247.9 lbs today.  - Creatinine bumped from 1.49>>1.68 today. Patient denies sob, orthopnea. Appears euvolemic on exam  - Stop IV lasix today  - Start PO lasix 60 mg daily tomorrow  - Continue spironolactone  12.5 mg daily, losartan 25 mg daily, carvedilol 3.125 mg BID  - BP somewhat soft this admission-- I  do not think patient would tolerate transition to entresto now. Has not tolerated entresto due to low BP in the past. - Did not add SGLT2 inhibitor this admission due to obesity/pannus/use of Hydrea with risk of infection.  - Will further advanced GDMT as an outpatient   History of PPM  - Patient has a BiV PPM-- [employer] - Most recent device check showed normal device function, but  that patient had less than 3 months until ERI.  - Next device check scheduled for 02/23/22. Device is followed by Dr. Caryl Comes   HTN  - BP well controlled, continue current medications   HLD  - Lipid panel this admission showed LDL 27, HDL 26, triglycerides 83  - Continue home crestor 5 mg daily      For questions or updates, please contact [place] Please consult [email or web address] for contact info under     Signed, Margie Billet, PA-C  02/08/2022, 8:11 AM   As above, patient seen and examined.  His dyspnea has resolved.  He denies chest pain.  Creatinine mildly increased today.  I will change Lasix to 60 mg by mouth daily (begin tomorrow).  He will weigh daily and take an additional 40 mg for weight gain of 2 to 3 pounds.  Continue low-sodium diet.  Continue spironolactone, losartan and low-dose carvedilol.  We have not resumed Entresto as he had hypotension with this previously.  I have not added an SGLT2 inhibitor due to obesity/pannus/use of Hydrea with associated risk of infection.  Patient can be discharged from a cardiac standpoint.  Would arrange bmet 1 week following discharge.  Follow-up with APP 2 weeks.  Follow-up with me 3 months. Kirk Ruths

## 2022-02-08 NOTE — Hospital Course (Addendum)
William Villanueva was admitted to the hospital with the working diagnosis of decompensated heart failure.   71 yo male with the past medical history of PVC, heart failure, hypertension and AV block sp pacemaker, who presented with dyspnea. At home had weight increase and worsening edema, that did not improved completely with increased dose of oral diuretic therapy. On his initial physical examination his blood pressure was 107/44, HR 95, RR 25 and 02 saturation 92%, lungs with rales bilaterally with no wheezing, heart with S1 and S2 present and rhythmic, abdomen with no distention, positive lower extremity edema more right than left.   Na 139, K 4,0 CL 110, bicarbonate 24, glucose 108 bun 16 cr 1,27  BNP 634  Wbc 7,0 hgb 10,2 plt 512  Sars covid 19 and influenza negative   Chest radiograph with cardiomegaly with bilateral hilar vascular congestion, with small bilateral pleural effusions, pacemaker in place with one atrial and two ventricular leads.   EKG 99 bpm, right axis, qtc 598, ventricular paced rhythm, with multifocal PVC, with no significant ST segment or T wave changes.   Patient was placed on furosemide for diuresis with improvement in his symptoms.  Further work up with CT chest and doppler lower extremities negative for deep venous thromboembolism.   Volume status has improved, hospitalization prolonged due to AKI.   Renal function now has improved and patient will follow up with primary care and cardiology as outpatient.

## 2022-02-08 NOTE — Assessment & Plan Note (Addendum)
CKD stage 3 a  Peaked cr at 1,89, at the time of his discharge is down to 1,79, his K is 4,2 and serum bicarbonate at 25. Plan to continue diuresis with spironolactone and furosemide.  Follow up renal function and electrolytes as outpatient.

## 2022-02-08 NOTE — Assessment & Plan Note (Signed)
Calculated BMI is 35.5

## 2022-02-08 NOTE — Telephone Encounter (Signed)
   Reason for call: ED-Follow up Visit  Patient  visit on 02/03/2022  at [facility] was for [facility]  Have you been able to follow up with your primary care physician? - Patient is still currently hospitalized   The patient was or was not able to obtain any needed medicine or equipment. - Patient is still currently hospitalized   Are there diet recommendations that you are having difficulty following? - Patient is still currently hospitalized   [place]  Main Phone: [phone]  E-mail: [email or web address]  Website: [email or web address]

## 2022-02-08 NOTE — Progress Notes (Signed)
Heart Failure Stewardship Pharmacist Progress Note   PCP: Antony Contras, MD PCP-Cardiologist: Kirk Ruths, MD    HPI:  71 yo M with PMH of polycythemia vera, CHF, HTN, and gout.  Follows with Dr. Stanford Breed with initial dx of CHF in 2013 with EF 35-40% with cath at that time showing mild nonobstructive CAD. cMRI in 2013 showed EF 34% with no hyperenhancement or scar tissue and no evidence of cardiac hemochromatosis. CRT-P placed in 2015. Last ECHO prior to this admission in 06/2020 showed EF 45-50%.   Presented to the ED on 9/29 with shortness of breath x 3 weeks, LE edema, orthopnea, and weight gain. Did not respond to increased lasix dose as outpatient. CXR with mild pulmonary edema and trace bilateral pleural effusions. CTA negative for PE. ECHO 9/30 showed LVEF 40%, G2DD, RV normal, mildly elevated PA pressures, and mild MR.   Current HF Medications: Diuretic: furosemide 60 mg PO daily starting 10/5 Beta Blocker: carvedilol 3.125 mg BID ACE/ARB/ARNI: losartan 25 mg daily MRA: spironolactone 12.5 mg daily  Prior to admission HF Medications: Diuretic: furosemide 40 mg daily Beta blocker: carvedilol 3.125 mg BID ACE/ARB/ARNI: losartan 12.5 mg daily MRA: spironolactone 12.5 mg daily  Pertinent Lab Values: Serum creatinine 1.68, BUN 24, Potassium 4.3, Sodium 137, BNP 634>450.8, Magnesium 2.1, A1c 5.4   Vital Signs: Weight: 247 lbs (admission weight: 255 lbs) Blood pressure: 110/60s  Heart rate: 90s  I/O: -569m yesterday; net -5044m Medication Assistance / Insurance Benefits Check: Does the patient have prescription insurance?  Yes [facility]   Outpatient Pharmacy:  [facility]? Yes Is the patient willing to transition their outpatient pharmacy [facility]?   Pending    Assessment: 1. Acute on chronic systolic  CHF (LVEF 4050% due to NICM. NYHA class II symptoms. - Agree with transitioning to PO lasix tomorrow with bump in SCr. I/Os seem incomplete. Strict I/Os and daily weights. Keep K>4 and Mag>2. - Continue carvedilol 3.125 mg BID - Continue losartan 25 mg daily. Stopped Entresto in 2022 because of hypotension. - Continue spironolactone 12.5 mg daily - SGLT2i not added due to use of Hydrea and pannus/risk of infection   Plan: 1) Medication changes recommended at this time: - Continue current regimen  2) Patient assistance: - None pending  3)  Education  - Patient has been educated on current HF medications and potential additions to HF medication regimen - Patient verbalizes understanding that over the next few months, these medication doses may change and more medications may be added to optimize HF regimen - Patient has been educated on basic disease state pathophysiology and goals of therapy   MeKerby NoraPharmD, BCPS Heart Failure Stewardship Pharmacist Phone ([phone]

## 2022-02-08 NOTE — Progress Notes (Signed)
Ordered BMP to be drawn in 1 week-- Dr. Jacalyn Lefevre patient

## 2022-02-08 NOTE — Plan of Care (Signed)
  Problem: Education: Goal: Knowledge of General Education information will improve Description: Including pain rating scale, medication(s)/side effects and non-pharmacologic comfort measures Outcome: Progressing   Problem: Activity: Goal: Risk for activity intolerance will decrease Outcome: Progressing   Problem: Coping: Goal: Level of anxiety will decrease Outcome: Progressing

## 2022-02-09 ENCOUNTER — Ambulatory Visit: Payer: Medicare Other | Admitting: Internal Medicine

## 2022-02-09 DIAGNOSIS — I5043 Acute on chronic combined systolic (congestive) and diastolic (congestive) heart failure: Secondary | ICD-10-CM | POA: Diagnosis not present

## 2022-02-09 LAB — BASIC METABOLIC PANEL
Anion gap: 10 (ref 5–15)
BUN: 34 mg/dL — ABNORMAL HIGH (ref 8–23)
CO2: 25 mmol/L (ref 22–32)
Calcium: 8.8 mg/dL — ABNORMAL LOW (ref 8.9–10.3)
Chloride: 101 mmol/L (ref 98–111)
Creatinine, Ser: 1.89 mg/dL — ABNORMAL HIGH (ref 0.61–1.24)
GFR, Estimated: 38 mL/min — ABNORMAL LOW (ref >60)
Glucose, Bld: 100 mg/dL — ABNORMAL HIGH (ref 70–99)
Potassium: 4.3 mmol/L (ref 3.5–5.1)
Sodium: 136 mmol/L (ref 135–145)

## 2022-02-09 LAB — GLUCOSE, CAPILLARY
Glucose-Capillary: 92 mg/dL (ref 70–99)
Glucose-Capillary: 114 mg/dL — ABNORMAL HIGH (ref 70–99)
Glucose-Capillary: 86 mg/dL (ref 70–99)
Glucose-Capillary: 127 mg/dL — ABNORMAL HIGH (ref 70–99)

## 2022-02-09 MED ORDER — GUAIFENESIN-DM 100-10 MG/5ML PO SYRP
5.0000 mL | ORAL_SOLUTION | Freq: Three times a day (TID) | ORAL | Status: DC
  Administered 2022-02-09 – 2022-02-10 (×4): 5 mL via ORAL
  Filled 2022-02-09 (×4): qty 5

## 2022-02-09 MED ORDER — FUROSEMIDE 40 MG PO TABS: 40.0000 mg | ORAL_TABLET | Freq: Every day | ORAL | Status: DC

## 2022-02-09 MED ORDER — FUROSEMIDE 40 MG PO TABS
60.0000 mg | ORAL_TABLET | Freq: Every day | ORAL | Status: DC
  Administered 2022-02-09: 60 mg via ORAL
  Filled 2022-02-09: qty 1

## 2022-02-09 MED ORDER — INSULIN GLARGINE-YFGN 100 UNIT/ML ~~LOC~~ SOLN
15.0000 [IU] | Freq: Every day | SUBCUTANEOUS | Status: DC
  Administered 2022-02-09 – 2022-02-10 (×2): 15 [IU] via SUBCUTANEOUS
  Filled 2022-02-09 (×2): qty 0.15

## 2022-02-09 NOTE — Progress Notes (Signed)
Occupational Therapy Treatment Patient Details Name: William Villanueva MRN: [id number] DOB: Feb 15, 1951 Today's Date: 02/09/2022   History of present illness Pt is a 71 y/o male admitted 02/03/22 with c/o dyspnea, recent weight gain, peripheral edema, cough. Workup for acute on chronic combined systolic/diastolic HF. PMH includes CHF, HTN, AV block with biventricular PPM.   OT comments  Patient up in recliner upon entry. Patient seen to address self care with shower transfer, bathing in shower, and dressing. Patient was supervision for safety for all tasks with occasional cue for safety. Patient required seated rest break following shower and dressing. Acute OT to continue to follow.    Recommendations for follow up therapy are one component of a multi-disciplinary discharge planning process, led by the attending physician.  Recommendations may be updated based on patient status, additional functional criteria and insurance authorization.    Follow Up Recommendations  No OT follow up    Assistance Recommended at Discharge PRN  Patient can return home with the following  Assistance with cooking/housework   Equipment Recommendations  None recommended by OT    Recommendations for Other Services      Precautions / Restrictions Precautions Precautions: Fall;Other (comment) Precaution Comments: Watch SpO2 (does not wear baseline) Restrictions Weight Bearing Restrictions: No       Mobility Bed Mobility Overal bed mobility: Independent             General bed mobility comments: sitting EOB on arrival    Transfers Overall transfer level: Needs assistance Equipment used: None Transfers: Sit to/from Stand Sit to Stand: Modified independent (Device/Increase time)           General transfer comment: supervision for safety with shower transfers     Balance Overall balance assessment: Needs assistance Sitting-balance support: Feet supported Sitting balance-Leahy Scale:  Good Sitting balance - Comments: seated in recliner   Standing balance support: No upper extremity supported, During functional activity Standing balance-Leahy Scale: Fair Standing balance comment: able to stand in shower with occasional use of bath rails                           ADL either performed or assessed with clinical judgement   ADL Overall ADL's : Needs assistance/impaired     Grooming: Supervision/safety;Standing Grooming Details (indicate cue type and reason): at sink Upper Body Bathing: Supervision/ safety;Standing Upper Body Bathing Details (indicate cue type and reason): in shower Lower Body Bathing: Supervison/ safety;Sit to/from stand Lower Body Bathing Details (indicate cue type and reason): in shower Upper Body Dressing : Set up;Sitting Upper Body Dressing Details (indicate cue type and reason): donned gown Lower Body Dressing: Supervision/safety;Sit to/from stand Lower Body Dressing Details (indicate cue type and reason): donned underwear and pants         Tub/ Shower Transfer: Supervision/safety;Walk-in Lobbyist Details (indicate cue type and reason): supervision for safety   General ADL Comments: supervision for all self care tasks with seated rest breaks following shower and dressing    Extremity/Trunk Assessment              Vision       Perception     Praxis      Cognition Arousal/Alertness: Awake/alert Behavior During Therapy: WFL for tasks assessed/performed Overall Cognitive Status: Within Functional Limits for tasks assessed  General Comments: cues for safety        Exercises      Shoulder Instructions       General Comments      Pertinent Vitals/ Pain       Pain Assessment Pain Assessment: Faces Faces Pain Scale: Hurts a little bit Pain Location: back (chronic), R hip Pain Descriptors / Indicators: Discomfort Pain Intervention(s): Monitored  during session, Repositioned  Home Living                                          Prior Functioning/Environment              Frequency  Min 2X/week        Progress Toward Goals  OT Goals(current goals can now be found in the care plan section)  Progress towards OT goals: Progressing toward goals     Plan Discharge plan remains appropriate    Co-evaluation                 AM-PAC OT "6 Clicks" Daily Activity     Outcome Measure   Help from another person eating meals?: None Help from another person taking care of personal grooming?: None Help from another person toileting, which includes using toliet, bedpan, or urinal?: None Help from another person bathing (including washing, rinsing, drying)?: None Help from another person to put on and taking off regular upper body clothing?: None Help from another person to put on and taking off regular lower body clothing?: None 6 Click Score: 24    End of Session Equipment Utilized During Treatment: Oxygen      Activity Tolerance Patient tolerated treatment well   Patient Left in chair;with call bell/phone within reach;with family/visitor present   Nurse Communication Mobility status        Time: 2952-8413 OT Time Calculation (min): 29 min  Charges: OT General Charges $OT Visit: 1 Visit OT Treatments $Self Care/Home Management : 23-37 mins  Lodema [place], [place]  Office [place] 02/09/2022, 8:52 AM

## 2022-02-09 NOTE — Progress Notes (Addendum)
Progress Note   Patient: Kodiak Rollyson OFB:[id number] DOB: 02-May-1951 DOA: 02/03/2022     5 DOS: the patient was seen and examined on 02/09/2022   Brief hospital course: Mr. Mcquerry was admitted to the hospital with the working diagnosis of decompensated heart failure.   71 yo male with the past medical history of PVC, heart failure, hypertension and AV block sp pacemaker, who presented with dyspnea. At home had weight increase and worsening edema, that did not improved completely with increased dose of oral diuretic therapy. On his initial physical examination his blood pressure was 107/44, HR 95, RR 25 and 02 saturation 92%, lungs with rales bilaterally with no wheezing, heart with S1 and S2 present and rhythmic, abdomen with no distention, positive lower extremity edema more right than left.   Na 139, K 4,0 CL 110, bicarbonate 24, glucose 108 bun 16 cr 1,27  BNP 634  Wbc 7,0 hgb 10,2 plt 512  Sars covid 19 and influenza negative   Chest radiograph with cardiomegaly with bilateral hilar vascular congestion, with small bilateral pleural effusions, pacemaker in place in one atrial and two ventricular leads.   EKG 99 bpm, right axis, qtc 598, ventricular paced rhythm, with multifocal PVC, with no significant ST segment or T wave changes.   Patient was placed on furosemide for diuresis with improvement in his symptoms.  Further work up with CT chest and doppler lower extremities negative for deep venous thromboembolism.   Volume status has improved, hospitalization prolonged due to AKI.      Assessment and Plan: * Acute on chronic combined systolic (congestive) and diastolic (congestive) heart failure (HCC) Echocardiogram with reduced LV systolic function 78%, with global hypokinesis. RV systolic function preserved, RVSP 41,9 mmHg, mild RV enlarged cavity, LA with severe dilatation.   Patient has lost 4 kg since admission Documented urine output id 500 cc Patient with persistent  cough and orthopnea, positive right pedal edema.   Continue medical therapy with carvedilol, losartan and spironolactone. Will resume oral furosemide 60 mg today and will follow up renal function in am.   Essential hypertension Continue with carvedilol, losartan and diuresis with furosemide and spironolactone.   DM2 (diabetes mellitus, type 2) (HCC) Fasting glucose this am 100 mg/dl Patient is tolerating po well. Continue with insulin sliding scale for glucose cover and monitoring.  Continue with basal insulin at 15 units considering worsening renal function, has risk of hypoglycemia.   Polycythemia vera ([place]) Continue hydrea Cell count has been stable.   Acute kidney injury superimposed on chronic kidney disease ([place]) CKD stage 3 a  Today with serum cr at 1.89 with K at 4,3 and serum bicarbonate at 25.   Patient with worsening renal function, today on exam with signs of volume overload. Will resume diuresis with furosemide 60 mg and will continue with spironolactone Follow up renal function in am.    Class 2 obesity Calculated BMI is 35.5         Subjective: Patient with cough and orthopnea, positive right pedal edema, no chest pain.   Physical Exam: Vitals:   02/08/22 1735 02/08/22 1958 02/08/22 2006 02/09/22 0635  BP: 110/64 (!) 103/53  (!) 106/53  Pulse: 100 (!) 103  86  Resp:  18  18  Temp: 97.6 F (36.4 C) 97.8 F (36.6 C)  97.7 F (36.5 C)  TempSrc: Oral Oral  Oral  SpO2: (!) 86% 94% 97% 94%  Weight:    112.1 kg  Height:  Neurology awake and alert ENT with mild pallor Cardiovascular with S1 and S2 present and rhythmic with no gallops, rubs or murmurs Respiratory with rales at bases with no wheezing Abdomen with no distention  Positive right pedal edema pitting.  Data Reviewed:    Family Communication: I spoke with patient's wife at the bedside, we talked in detail about patient's condition, plan of care and prognosis and all questions were  addressed.   Disposition: Status is: Inpatient Remains inpatient appropriate because: heart failure and renal failure.   Planned Discharge Destination: Home      Author: Tawni Millers, MD 02/09/2022 9:07 AM  For on call review [email or web address].

## 2022-02-09 NOTE — Plan of Care (Signed)
  Problem: Activity: Goal: Risk for activity intolerance will decrease Outcome: Progressing   Problem: Safety: Goal: Ability to remain free from injury will improve Outcome: Progressing   

## 2022-02-09 NOTE — Progress Notes (Signed)
Rounding Note    Patient Name: William Villanueva Date of Encounter: 02/09/2022  [place] Cardiologist: Kirk Ruths, MD   Subjective   No SOB or CP   Inpatient Medications    Scheduled Meds:  allopurinol  300 mg Oral Daily   budesonide  0.5 mg Nebulization BID   carvedilol  3.125 mg Oral BID WC   colestipol  1 g Oral BID   cyanocobalamin  1,000 mcg Oral Daily   dicyclomine  20 mg Oral BID   enoxaparin (LOVENOX) injection  40 mg Subcutaneous Daily   folic acid  1 mg Oral Daily   furosemide  60 mg Oral Daily   guaiFENesin-dextromethorphan  5 mL Oral TID   hydroxyurea  1,000 mg Oral Daily   insulin glargine-yfgn  15 Units Subcutaneous Daily   losartan  25 mg Oral Daily   magnesium oxide  800 mg Oral BID   oxybutynin  10 mg Oral Daily   pantoprazole  40 mg Oral Daily   rosuvastatin  5 mg Oral Daily   spironolactone  12.5 mg Oral Daily   tamsulosin  0.4 mg Oral BID   Continuous Infusions:  PRN Meds: acetaminophen, benzonatate, ipratropium-albuterol, ondansetron (ZOFRAN) IV, mouth rinse, zolpidem   Vital Signs    Vitals:   02/08/22 1958 02/08/22 2006 02/09/22 0635 02/09/22 0918  BP: (!) 103/53  (!) 106/53   Pulse: (!) 103  86   Resp: 18  18   Temp: 97.8 F (36.6 C)  97.7 F (36.5 C)   TempSrc: Oral  Oral   SpO2: 94% 97% 94% 96%  Weight:   112.1 kg   Height:        Intake/Output Summary (Last 24 hours) at 02/09/2022 1019 Last data filed at 02/09/2022 0827 Gross per 24 hour  Intake 957 ml  Output 500 ml  Net 457 ml       02/09/2022    6:35 AM 02/08/2022    4:20 AM 02/07/2022    4:33 AM  Last 3 Weights  Weight (lbs) 247 lb 3.2 oz 247 lb 14.4 oz 249 lb 8 oz  Weight (kg) 112.129 kg 112.447 kg 113.172 kg      Telemetry    Sinus with ventricular pacing  - Personally Reviewed   Physical Exam   GEN: NAD Neck: Supple Cardiac: RRR Respiratory: CTA MS: No edema Neuro:  Grossly intact Psych: Normal affect   Labs    High Sensitivity  Troponin:   Recent Labs  Lab 02/03/22 2023 02/03/22 2036  TROPONINIHS 21* 19*      Chemistry Recent Labs  Lab 02/05/22 0259 02/06/22 0100 02/07/22 0031 02/08/22 0040 02/09/22 0043  NA 139 138 138 137 136  K 4.0 3.7 4.5 4.3 4.3  CL 102 103 99 100 101  CO2 26 24 26 25 25   GLUCOSE 109* 124* 92 93 100*  BUN 18 18 20  24* 34*  CREATININE 1.54* 1.45* 1.49* 1.68* 1.89*  CALCIUM 8.6* 8.7* 9.2 8.8* 8.8*  MG 1.8 1.8 2.1  --   --   PROT 6.6 6.8 7.2 6.9  --   ALBUMIN 3.3* 3.4* 3.5 3.4*  --   AST 14* 13* 18 19  --   ALT 7 8 10 11   --   ALKPHOS 58 60 62 59  --   BILITOT 1.0 0.9 1.2 1.0  --   GFRNONAA 48* 52* 50* 43* 38*  ANIONGAP 11 11 13 12 10      Lipids  Recent Labs  Lab 02/06/22 0100  CHOL 70  TRIG 83  HDL 26*  LDLCALC 27  CHOLHDL 2.7     Hematology Recent Labs  Lab 02/06/22 0100 02/07/22 0031 02/08/22 0040  WBC 5.5 6.3 10.4  RBC 3.00* 3.13* 3.09*  HGB 10.0* 10.4* 10.2*  HCT 32.4* 33.5* 33.4*  MCV 108.0* 107.0* 108.1*  MCH 33.3 33.2 33.0  MCHC 30.9 31.0 30.5  RDW 16.5* 16.6* 16.6*  PLT 543* 578* 578*    BNP Recent Labs  Lab 02/03/22 1314 02/05/22 0259 02/06/22 0100  BNP 634.0* 475.2* 450.8*     DDimer  Recent Labs  Lab 02/03/22 2023  DDIMER 1.04*       Cardiac Studies   Echocardiogram 02/04/22 1. Left ventricular ejection fraction, by estimation, is 40%. The left  ventricle has moderately decreased function. The left ventricle  demonstrates global hypokinesis. The left ventricular internal cavity size  was severely dilated. Left ventricular  diastolic parameters are consistent with Grade II diastolic dysfunction  (pseudonormalization). Elevated left atrial pressure. The E/e' is 19.8.   2. Right ventricular systolic function is normal. The right ventricular  size is mildly enlarged. There is mildly elevated pulmonary artery  systolic pressure.   3. Left atrial size was severely dilated.   4. Right atrial size was mildly dilated.   5.  The mitral valve is grossly normal. Mild mitral valve regurgitation.   6. The aortic valve is tricuspid. There is mild thickening of the aortic  valve. Aortic valve regurgitation is not visualized. Aortic valve  sclerosis is present, with no evidence of aortic valve stenosis.   7. The inferior vena cava is dilated in size with >50% respiratory  variability, suggesting right atrial pressure of 8 mmHg.   Comparison(s): Compared to prior TTE on 06/2020, the LV is now severely  dilated with EF ~40% (previously moderately dilated with EF 45-50%).   Patient Profile     71 y.o. male  with past medical history of chronic combined systolic/diastolic congestive heart failure, polycythemia vera, hemochromatosis, nonischemic cardiomyopathy, history of pacemaker, hypertension for evaluation of acute on chronic combined systolic/diastolic congestive heart failure.   Assessment & Plan    Acute on chronic combined systolic and diastolic heart failure  Nonischemic Cardiomyopathy  - Echocardiogram this admission showed EF 24%, grade 2 diastolic dysfunction, biatrial enlargement, mild mitral regurgitation.  -Patient's volume status is much improved.  Creatinine mildly increased today.  Will change Lasix to 40 mg by mouth daily.  He can be discharged on this dose with an additional 40 mg daily for weight gain of 2 to 3 pounds.  Continue spironolactone.  I have not added an SGLT2 inhibitor due to patient's obesity/pannus/use of Hydrea and risk of infection.  History of PPM  -Follow-up with Dr. Caryl Comes after discharge.  HTN  -Blood pressure controlled.  Continue present medications.  HLD  -Continue statin.  Patient can be discharged from a cardiac standpoint.  Would plan to continue present medications with an additional 40 mg of Lasix daily for weight gain of 2 to 3 pounds.  I will arrange a bmet in 1 week, follow-up with APP 1 to 2 weeks and me in 3 months.     For questions or updates, please contact  [place] Please consult [email or web address] for contact info under     Signed, Kirk Ruths, MD  02/09/2022, 10:19 AM

## 2022-02-09 NOTE — Progress Notes (Signed)
Heart Failure Stewardship Pharmacist Progress Note   PCP: Antony Contras, MD PCP-Cardiologist: Kirk Ruths, MD    HPI:  71 yo M with PMH of polycythemia vera, CHF, HTN, and gout.  Follows with Dr. Stanford Breed with initial dx of CHF in 2013 with EF 35-40% with cath at that time showing mild nonobstructive CAD. cMRI in 2013 showed EF 34% with no hyperenhancement or scar tissue and no evidence of cardiac hemochromatosis. CRT-P placed in 2015. Last ECHO prior to this admission in 06/2020 showed EF 45-50%.   Presented to the ED on 9/29 with shortness of breath x 3 weeks, LE edema, orthopnea, and weight gain. Did not respond to increased lasix dose as outpatient. CXR with mild pulmonary edema and trace bilateral pleural effusions. CTA negative for PE. ECHO 9/30 showed LVEF 40%, G2DD, RV normal, mildly elevated PA pressures, and mild MR.   Current HF Medications: Diuretic: furosemide 60 mg PO daily Beta Blocker: carvedilol 3.125 mg BID ACE/ARB/ARNI: losartan 25 mg daily MRA: spironolactone 12.5 mg daily  Prior to admission HF Medications: Diuretic: furosemide 40 mg daily Beta blocker: carvedilol 3.125 mg BID ACE/ARB/ARNI: losartan 12.5 mg daily MRA: spironolactone 12.5 mg daily  Pertinent Lab Values: Serum creatinine 1.89, BUN 34, Potassium 4.3, Sodium 136, BNP 634>450.8, Magnesium 2.1, A1c 5.4   Vital Signs: Weight: 247 lbs (admission weight: 255 lbs) Blood pressure: 100/50s  Heart rate: 80-90s  I/O: -320m yesterday; net even  Medication Assistance / Insurance Benefits Check: Does the patient have prescription insurance?  Yes Type of insurance plan: [facility]   Outpatient Pharmacy:  [facility]? Yes Is the patient willing to transition their outpatient pharmacy [facility]?   Pending    Assessment: 1. Acute on chronic systolic CHF (LVEF 453%,  due to NICM. NYHA class II symptoms. - Continue furosemide 60 mg PO daily. SCr bump. I/Os seem incomplete. Strict I/Os and daily weights. Keep K>4 and Mag>2. - Continue carvedilol 3.125 mg BID - Continue losartan 25 mg daily. Stopped Entresto in 2022 because of hypotension. - Continue spironolactone 12.5 mg daily - SGLT2i not added due to use of Hydrea and pannus/risk of infection   Plan: 1) Medication changes recommended at this time: - Continue current regimen  2) Patient assistance: - None pending  3)  Education  - Patient has been educated on current HF medications and potential additions to HF medication regimen - Patient verbalizes understanding that over the next few months, these medication doses may change and more medications may be added to optimize HF regimen - Patient has been educated on basic disease state pathophysiology and goals of therapy   MKerby Nora PharmD, BCPS Heart Failure Stewardship Pharmacist Phone [phone]

## 2022-02-10 ENCOUNTER — Other Ambulatory Visit (HOSPITAL_COMMUNITY): Payer: Self-pay

## 2022-02-10 ENCOUNTER — Other Ambulatory Visit (HOSPITAL_COMMUNITY): Payer: Medicare Other

## 2022-02-10 DIAGNOSIS — J449 Chronic obstructive pulmonary disease, unspecified: Secondary | ICD-10-CM

## 2022-02-10 LAB — BASIC METABOLIC PANEL
Anion gap: 10 (ref 5–15)
BUN: 41 mg/dL — ABNORMAL HIGH (ref 8–23)
CO2: 25 mmol/L (ref 22–32)
Calcium: 8.8 mg/dL — ABNORMAL LOW (ref 8.9–10.3)
Chloride: 100 mmol/L (ref 98–111)
Creatinine, Ser: 1.79 mg/dL — ABNORMAL HIGH (ref 0.61–1.24)
GFR, Estimated: 40 mL/min — ABNORMAL LOW (ref >60)
Glucose, Bld: 100 mg/dL — ABNORMAL HIGH (ref 70–99)
Potassium: 4.2 mmol/L (ref 3.5–5.1)
Sodium: 135 mmol/L (ref 135–145)

## 2022-02-10 LAB — GLUCOSE, CAPILLARY
Glucose-Capillary: 113 mg/dL — ABNORMAL HIGH (ref 70–99)
Glucose-Capillary: 122 mg/dL — ABNORMAL HIGH (ref 70–99)

## 2022-02-10 MED ORDER — SPIRONOLACTONE 25 MG PO TABS: 12.5000 mg | ORAL_TABLET | Freq: Every day | ORAL | Status: DC

## 2022-02-10 MED ORDER — GUAIFENESIN-DM 100-10 MG/5ML PO SYRP
5.0000 mL | ORAL_SOLUTION | Freq: Four times a day (QID) | ORAL | 0 refills | Status: DC | PRN
  Filled 2022-02-10: qty 237, 12d supply, fill #0

## 2022-02-10 MED ORDER — FUROSEMIDE 40 MG PO TABS: 40.0000 mg | ORAL_TABLET | Freq: Every day | ORAL | Status: DC

## 2022-02-10 MED ORDER — ZOLPIDEM TARTRATE 5 MG PO TABS: 5.0000 mg | ORAL_TABLET | Freq: Every evening | ORAL | 0 refills | Status: DC | PRN

## 2022-02-10 MED ORDER — FUROSEMIDE 40 MG PO TABS
40.0000 mg | ORAL_TABLET | Freq: Every day | ORAL | 0 refills | Status: DC
  Filled 2022-02-10: qty 30, 30d supply, fill #0

## 2022-02-10 MED ORDER — LOSARTAN POTASSIUM 25 MG PO TABS
25.0000 mg | ORAL_TABLET | Freq: Every day | ORAL | 0 refills | Status: DC
  Filled 2022-02-10: qty 30, 30d supply, fill #0

## 2022-02-10 NOTE — Discharge Instructions (Signed)
WEIGH DAILY, every am, wearing the same amount of clothing Record weights, and bring it to office appointments Contact Kirk Ruths, MD for weight gain of 3 lbs in a day or 5 lbs in a week Limit sodium to 500 mg per meal, total 2000 mg per day Limit all liquids to 1.5-2 liters/quarts per day

## 2022-02-10 NOTE — Care Management Important Message (Signed)
Important Message  Patient Details  Name: William Villanueva MRN: [id number] Date of Birth: Oct 05, 1950   Medicare Important Message Given:  Yes     Shelda Altes 02/10/2022, 9:46 AM

## 2022-02-10 NOTE — Assessment & Plan Note (Signed)
No signs of acute exacerbation  Plan to continue with inhaled corticosteroids and bronchodilator therapy Continue with as needed antitussive agents Added home 02 supplementation.

## 2022-02-10 NOTE — Progress Notes (Signed)
SATURATION QUALIFICATIONS: (This note is used to comply with regulatory documentation for home oxygen)  Patient Saturations on Room Air at Rest = 94%  Patient Saturations on Room Air while Ambulating = 83%  Patient Saturations on 2 Liters of oxygen while Ambulating = 95%  Please briefly explain why patient needs home oxygen:

## 2022-02-10 NOTE — Discharge Summary (Addendum)
Physician Discharge Summary   Patient: William Villanueva MRN: [id number] DOB: 10/10/1950  Admit date:     02/03/2022  Discharge date: 02/10/22  Discharge Physician: Tawni Millers   PCP: Antony Contras, MD   Recommendations at discharge:    Continue diuresis with furosemide and spironolactone to resume on Sunday 02/12/22. In case of weight gain 2 to 3 lbs in 24 hrs or 5 lbs in 7 days, increase furosemide to 40 mg bid for 3 to 4 days until weight improvement.  Increased losartan to 25 mg daily Advices about fluid restriction 1,200 to 1,500 ml per day and less than 2 g salt.  Home 02 added for ambulatory hypoxemia  I spoke over the phone with the patient's wife about patient's  condition, plan of care, prognosis and all questions were addressed.   Discharge Diagnoses: Principal Problem:   Acute on chronic combined systolic (congestive) and diastolic (congestive) heart failure (HCC) Active Problems:   Essential hypertension   DM2 (diabetes mellitus, type 2) (HCC)   Polycythemia vera (HCC)   Acute kidney injury superimposed on chronic kidney disease (HCC)   Class 2 obesity  Resolved Problems:   * No resolved hospital problems. William Villanueva Course: William Villanueva was admitted to the hospital with the working diagnosis of decompensated heart failure.   71 yo male with the past medical history of PVC, heart failure, hypertension and AV block sp pacemaker, who presented with dyspnea. At home had weight increase and worsening edema, that did not improved completely with increased dose of oral diuretic therapy. On his initial physical examination his blood pressure was 107/44, HR 95, RR 25 and 02 saturation 92%, lungs with rales bilaterally with no wheezing, heart with S1 and S2 present and rhythmic, abdomen with no distention, positive lower extremity edema more right than left.   Na 139, K 4,0 CL 110, bicarbonate 24, glucose 108 bun 16 cr 1,27  BNP 634  Wbc 7,0 hgb 10,2 plt 512  Sars  covid 19 and influenza negative   Chest radiograph with cardiomegaly with bilateral hilar vascular congestion, with small bilateral pleural effusions, pacemaker in place with one atrial and two ventricular leads.   EKG 99 bpm, right axis, qtc 598, ventricular paced rhythm, with multifocal PVC, with no significant ST segment or T wave changes.   Patient was placed on furosemide for diuresis with improvement in his symptoms.  Further work up with CT chest and doppler lower extremities negative for deep venous thromboembolism.   Volume status has improved, hospitalization prolonged due to AKI.   Renal function now has improved and patient will follow up with primary care and cardiology as outpatient.       Assessment and Plan: * Acute on chronic combined systolic (congestive) and diastolic (congestive) heart failure (HCC) Echocardiogram with reduced LV systolic function 87%, with global hypokinesis. RV systolic function preserved, RVSP 41,9 mmHg, mild RV enlarged cavity, LA with severe dilatation.   Patient was placed on IV furosemide, negative fluid balance was achieved with significant improvement in his symptoms.  Patient has lost 4 kg since admission  Continue medical therapy with carvedilol, losartan and spironolactone. Continue diuresis with furosemide 40 mg daily and instructions to increase to bid in case of weight gain, 2 to 3 lbs in 24 hrs or 5 lbs in 7 days.  Follow up as outpatient with cardiology.  Holding SGLT 2 ing for now.   Acute on chronic hypoxemic respiratory failure, dyspnea and volume status has improved, but  patient continue to have 02 desaturation on ambulation.  Will add home 02 and will recommend follow up as outpatient.   Essential hypertension Continue with carvedilol, losartan and diuresis with furosemide and spironolactone.   DM2 (diabetes mellitus, type 2) ([place]) \his glucose remained well controlled during his hospitalization, he was continued with  basal insulin, with good toleration.  Follow up as outpatient.   Polycythemia vera (HCC) Continue hydroxyurea.  Cell count has been stable.   Acute kidney injury superimposed on chronic kidney disease (HCC) CKD stage 3 a  Peaked cr at 1,89, at the time of his discharge is down to 1,79, his K is 4,2 and serum bicarbonate at 25. Plan to continue diuresis with spironolactone and furosemide.  Follow up renal function and electrolytes as outpatient.   Class 2 obesity Calculated BMI is 35.5   COPD (chronic obstructive pulmonary disease) (HCC) No signs of acute exacerbation  Plan to continue with inhaled corticosteroids and bronchodilator therapy Continue with as needed antitussive agents Added home 02 supplementation.          Consultants: cardiology  Procedures performed: none   Disposition: Home Diet recommendation:  Cardiac and Carb modified diet DISCHARGE MEDICATION: Allergies as of 02/10/2022       Reactions   Bupropion Hives, Other (See Comments)   Cephalexin Diarrhea, Other (See Comments)   Caused C-diff, also   Fluoxetine Itching   Ibuprofen Itching   Prednisone Itching, Other (See Comments)   Abdominal pain, also   Temazepam Other (See Comments)   Dizziness    Trazodone And Nefazodone Other (See Comments)   Dizziness   Fluoxetine Hcl Itching   Prozac [fluoxetine Hcl] Itching        Medication List     TAKE these medications    acetaminophen 500 MG tablet Commonly known as: TYLENOL Take 1,000 mg by mouth every 6 (six) hours as needed for mild pain.   allopurinol 300 MG tablet Commonly known as: ZYLOPRIM Take 300 mg by mouth daily.   B-12 PO Take 1 tablet by mouth daily.   budesonide 0.5 MG/2ML nebulizer solution Commonly known as: PULMICORT Inhale one vial in nebulizer twice a day. **Rinse mouth after use. What changed:  how much to take how to take this when to take this additional instructions   carvedilol 3.125 MG tablet Commonly  known as: Coreg Take 1 tablet (3.125 mg total) by mouth 2 (two) times daily with a meal.   colestipol 1 g tablet Commonly known as: COLESTID Take 1 g by mouth 2 (two) times daily.   dicyclomine 20 MG tablet Commonly known as: BENTYL Take 20 mg by mouth 2 (two) times daily.   folic acid 1 MG tablet Commonly known as: FOLVITE Take 1 mg by mouth daily.   furosemide 40 MG tablet Commonly known as: LASIX Take 1 tablet (40 mg total) by mouth daily. Take 40 mg twice day in case weight gain 2 to 3 lbs in 24 hrs or 5 lbs in 7 days, until weight back to baseline. Start taking on: February 12, 2022 What changed:  how much to take how to take this when to take this additional instructions These instructions start on February 12, 2022. If you are unsure what to do until then, ask your doctor or other care provider.   guaiFENesin-dextromethorphan 100-10 MG/5ML syrup Commonly known as: ROBITUSSIN DM Take 5 mLs by mouth every 6 (six) hours as needed for cough.   hydroxyurea 500 MG capsule Commonly known as:  HYDREA Take 2 capsules (1,000 mg total) by mouth daily. May take with food to minimize GI side effects. Currently taking 500 mg daily but MD note says may need to increase on 12/27/2020 to twice daily. What changed: additional instructions   ipratropium-albuterol 0.5-2.5 (3) MG/3ML Soln Commonly known as: DUONEB Inhale one vial in nebulizer 3 times a day and every 6 hrs as needed. What changed:  how much to take how to take this when to take this additional instructions   losartan 25 MG tablet Commonly known as: COZAAR Take 1 tablet (25 mg total) by mouth daily. What changed: how much to take   Magnesium Oxide 400 MG Caps Take 2 capsules (800 mg total) by mouth in the morning and at bedtime.   omeprazole 20 MG capsule Commonly known as: PRILOSEC Take 20 mg by mouth 2 (two) times daily before a meal.   oxybutynin 10 MG 24 hr tablet Commonly known as: DITROPAN-XL Take 10 mg by  mouth daily.   QC Tumeric Complex 500 MG Caps Generic drug: Turmeric Take 500 mg by mouth daily.   rosuvastatin 5 MG tablet Commonly known as: CRESTOR Take 5 mg by mouth daily.   spironolactone 25 MG tablet Commonly known as: ALDACTONE Take 0.5 tablets (12.5 mg total) by mouth daily. Start taking on: February 12, 2022 What changed: These instructions start on February 12, 2022. If you are unsure what to do until then, ask your doctor or other care provider.   tamsulosin 0.4 MG Caps capsule Commonly known as: FLOMAX Take 0.4 mg by mouth 2 (two) times daily.   Tyler Aas FlexTouch 100 UNIT/ML FlexTouch Pen Generic drug: insulin degludec Inject 20 Units into the skin daily after breakfast.   zolpidem 5 MG tablet Commonly known as: AMBIEN Take 1 tablet (5 mg total) by mouth at bedtime as needed for sleep.        Follow-up Information     [facility]. Go in 9 day(s).   Specialty: Cardiology Why: Hospital follow up PLEASE bring a current medication list to appointment FREE valet parking, Entrance C, off [employer] information: [address] [id number] [place]. [facility] Follow up on 02/15/2022.   Specialty: Cardiology Why: Please present to the office on 10/11 during regular office hours to have labs drawn. Contact information: [place] [id number] [address] [place]        Deberah Pelton, NP Follow up on 02/21/2022.   Specialty: Cardiology Why: Appointment at 10:05 AM Contact information: [address] [place] [address] [phone]                Discharge Exam: Filed Weights   02/08/22 0420 02/09/22 0635 02/10/22 0619  Weight: 112.4 kg 112.1 kg 112 kg   BP 110/62 (BP Location: Left Arm)   Pulse 94   Temp 98 F  (36.7 C) (Oral)   Resp 18   Ht 5' 10"  (1.778 m)   Wt 112 kg Comment: scale b  SpO2 95%   BMI 35.44 kg/m   Patient is feeling better, improved orthopnea, cough and edema  Neurology awake and alert ENT with no pallor Cardiovascular with S1 and S2 present and rhythmic with no gallops, rubs or murmurs Respiratory with no rales or wheezing Abdomen with no distention  Condition at discharge: stable  The results of significant diagnostics from this hospitalization (including imaging, microbiology, ancillary and laboratory) are listed below for reference.   Imaging Studies: ECHOCARDIOGRAM COMPLETE  Result Date: 02/04/2022    ECHOCARDIOGRAM REPORT   Patient Name:   KISHAWN PICKAR Date of Exam: 02/04/2022 Medical Rec #:  [id number]        Height:       70.0 in Accession #:    [id number]       Weight:       263.0 lb Date of Birth:  1950-05-28         BSA:          2.345 m Patient Age:    42 years         BP:           112/68 mmHg Patient Gender: M                HR:           81 bpm. Exam Location:  Inpatient Procedure: 2D Echo, Cardiac Doppler, Color Doppler and Intracardiac            Opacification Agent Indications:    CHF  History:        Patient has prior history of Echocardiogram examinations, most                 recent 06/21/2020. Signs/Symptoms:Shortness of Breath and                 Dyspnea.  Sonographer:    Merrie Roof RDCS Referring Phys: [place]  1. Left ventricular ejection fraction, by estimation, is 40%. The left ventricle has moderately decreased function. The left ventricle demonstrates global hypokinesis. The left ventricular internal cavity size was severely dilated. Left ventricular diastolic parameters are consistent with Grade II diastolic dysfunction (pseudonormalization). Elevated left atrial pressure. The E/e' is 19.8.  2. Right ventricular systolic function is normal. The right ventricular size is mildly enlarged. There is mildly elevated pulmonary  artery systolic pressure.  3. Left atrial size was severely dilated.  4. Right atrial size was mildly dilated.  5. The mitral valve is grossly normal. Mild mitral valve regurgitation.  6. The aortic valve is tricuspid. There is mild thickening of the aortic valve. Aortic valve regurgitation is not visualized. Aortic valve sclerosis is present, with no evidence of aortic valve stenosis.  7. The inferior vena cava is dilated in size with >50% respiratory variability, suggesting right atrial pressure of 8 mmHg. Comparison(s): Compared to prior TTE on 06/2020, the LV is now severely dilated with EF ~40% (previously moderately dilated with EF 45-50%). FINDINGS  Left Ventricle: Left ventricular ejection fraction, by estimation, is 40%. The left ventricle has moderately decreased function. The left ventricle demonstrates global hypokinesis. Definity contrast agent was given IV to delineate the left ventricular endocardial borders. The left ventricular internal cavity size was severely dilated. There is no left ventricular hypertrophy. Left ventricular diastolic parameters are consistent with Grade II diastolic dysfunction (pseudonormalization). Elevated left atrial pressure. The E/e' is 19.8. Right Ventricle: The right ventricular size is mildly enlarged. No increase in right ventricular wall thickness. Right ventricular systolic function is normal. There is mildly elevated pulmonary artery systolic pressure. The tricuspid regurgitant velocity is 2.91 m/s, and with an assumed right atrial pressure of 8 mmHg, the estimated right ventricular systolic pressure is 95.2 mmHg. Left Atrium: Left atrial size was severely dilated. Right Atrium: Right atrial  size was mildly dilated. Pericardium: There is no evidence of pericardial effusion. Mitral Valve: The mitral valve is grossly normal. There is mild thickening of the mitral valve leaflet(s). There is mild calcification of the mitral valve leaflet(s). Mild mitral valve  regurgitation. Tricuspid Valve: The tricuspid valve is normal in structure. Tricuspid valve regurgitation is trivial. Aortic Valve: The aortic valve is tricuspid. There is mild thickening of the aortic valve. Aortic valve regurgitation is not visualized. Aortic valve sclerosis is present, with no evidence of aortic valve stenosis. Aortic valve mean gradient measures 5.0  mmHg. Aortic valve peak gradient measures 8.5 mmHg. Aortic valve area, by VTI measures 1.32 cm. Pulmonic Valve: The pulmonic valve was normal in structure. Pulmonic valve regurgitation is not visualized. Aorta: The aortic root and ascending aorta are structurally normal, with no evidence of dilitation. Venous: The inferior vena cava is dilated in size with greater than 50% respiratory variability, suggesting right atrial pressure of 8 mmHg. IAS/Shunts: The atrial septum is grossly normal. Additional Comments: A device lead is visualized.  LEFT VENTRICLE PLAX 2D LVIDd:         6.90 cm      Diastology LVIDs:         5.70 cm      LV e' medial:    7.18 cm/s LV PW:         1.00 cm      LV E/e' medial:  15.3 LV IVS:        0.90 cm      LV e' lateral:   5.55 cm/s LVOT diam:     2.00 cm      LV E/e' lateral: 19.8 LV SV:         36 LV SV Index:   16 LVOT Area:     3.14 cm  LV Volumes (MOD) LV vol d, MOD A2C: 283.0 ml LV vol d, MOD A4C: 305.0 ml LV vol s, MOD A2C: 202.5 ml LV vol s, MOD A4C: 168.0 ml LV SV MOD A2C:     80.5 ml LV SV MOD A4C:     305.0 ml LV SV MOD BP:      115.5 ml RIGHT VENTRICLE             IVC RV Basal diam:  4.70 cm     IVC diam: 2.60 cm RV Mid diam:    3.70 cm RV S prime:     11.60 cm/s TAPSE (M-mode): 2.8 cm LEFT ATRIUM              Index        RIGHT ATRIUM           Index LA diam:        5.30 cm  2.26 cm/m   RA Area:     24.10 cm LA Vol (A2C):   92.7 ml  39.53 ml/m  RA Volume:   84.20 ml  35.91 ml/m LA Vol (A4C):   117.0 ml 49.89 ml/m LA Biplane Vol: 113.0 ml 48.19 ml/m  AORTIC VALVE AV Area (Vmax):    1.50 cm AV Area  (Vmean):   1.38 cm AV Area (VTI):     1.32 cm AV Vmax:           146.00 cm/s AV Vmean:          98.500 cm/s AV VTI:            0.276 m AV Peak Grad:      8.5  mmHg AV Mean Grad:      5.0 mmHg LVOT Vmax:         69.50 cm/s LVOT Vmean:        43.200 cm/s LVOT VTI:          0.116 m LVOT/AV VTI ratio: 0.42  AORTA Ao Root diam: 3.60 cm Ao Asc diam:  3.50 cm MITRAL VALVE                TRICUSPID VALVE MV Area (PHT): 5.38 cm     TR Peak grad:   33.9 mmHg MV Decel Time: 141 msec     TR Vmax:        291.00 cm/s MV E velocity: 110.00 cm/s MV A velocity: 78.50 cm/s   SHUNTS MV E/A ratio:  1.40         Systemic VTI:  0.12 m                             Systemic Diam: 2.00 cm Gwyndolyn Kaufman MD Electronically signed by Gwyndolyn Kaufman MD Signature Date/Time: 02/04/2022/1:39:17 PM    Final    VAS [place] LOWER EXTREMITY VENOUS (DVT) (7a-7p)  Result Date: 02/04/2022  Lower Venous DVT Study Patient Name:  LENZIE SANDLER  Date of Exam:   02/03/2022 Medical Rec #: [id number]         Accession #:    [id number] Date of Birth: 1950/08/12          Patient Gender: M Patient Age:   65 years Exam Location[facility]  [facility] Procedure:      VAS [place] LOWER EXTREMITY VENOUS (DVT) Referring Phys: Ova Freshwater FONDAW --------------------------------------------------------------------------------  Indications: Swelling.  Risk Factors: None identified. Comparison Study: No prior studies. Performing Technologist: Oliver Hum RVT  Examination Guidelines: A complete evaluation includes B-mode imaging, spectral Doppler, color Doppler, and power Doppler as needed of all accessible portions of each vessel. Bilateral testing is considered an integral part of a complete examination. Limited examinations for reoccurring indications may be performed as noted. The reflux portion of the exam is performed with the patient in reverse Trendelenburg.  +-----+---------------+---------+-----------+----------+--------------+  RIGHTCompressibilityPhasicitySpontaneityPropertiesThrombus Aging +-----+---------------+---------+-----------+----------+--------------+ CFV  Full           Yes      Yes                                 +-----+---------------+---------+-----------+----------+--------------+   +---------+---------------+---------+-----------+----------+--------------+ LEFT     CompressibilityPhasicitySpontaneityPropertiesThrombus Aging +---------+---------------+---------+-----------+----------+--------------+ CFV      Full           Yes      Yes                                 +---------+---------------+---------+-----------+----------+--------------+ SFJ      Full                                                        +---------+---------------+---------+-----------+----------+--------------+ FV Prox  Full                                                        +---------+---------------+---------+-----------+----------+--------------+  FV Mid   Full                                                        +---------+---------------+---------+-----------+----------+--------------+ FV DistalFull                                                        +---------+---------------+---------+-----------+----------+--------------+ PFV      Full                                                        +---------+---------------+---------+-----------+----------+--------------+ POP      Full           Yes      Yes                                 +---------+---------------+---------+-----------+----------+--------------+ PTV      Full                                                        +---------+---------------+---------+-----------+----------+--------------+ PERO     Full                                                        +---------+---------------+---------+-----------+----------+--------------+    Summary: RIGHT: - No evidence of common femoral vein  obstruction.  LEFT: - There is no evidence of deep vein thrombosis in the lower extremity.  - No cystic structure found in the popliteal fossa.  *See table(s) above for measurements and observations. Electronically signed by Deitra Mayo MD on 02/04/2022 at 6:14:44 AM.    Final    CT Angio Chest PE W/Cm &/Or Wo Cm  Result Date: 02/03/2022 CLINICAL DATA:  Cough and shortness of breath for several weeks EXAM: CT ANGIOGRAPHY CHEST WITH CONTRAST TECHNIQUE: Multidetector CT imaging of the chest was performed using the standard protocol during bolus administration of intravenous contrast. Multiplanar CT image reconstructions and MIPs were obtained to evaluate the vascular anatomy. RADIATION DOSE REDUCTION: This exam was performed according to the departmental dose-optimization program which includes automated exposure control, adjustment of the mA and/or kV according to patient size and/or use of iterative reconstruction technique. CONTRAST:  49m OMNIPAQUE IOHEXOL 350 MG/ML SOLN COMPARISON:  Chest x-ray from earlier in the same day, CT from 01/05/2022. FINDINGS: Cardiovascular: Atherosclerotic calcifications of the aorta are and its branches are seen although the degree of enhancement is minimal. No cardiac enlargement is noted. Pacing device is again seen. Pulmonary artery shows a normal branching pattern. No filling defect to suggest pulmonary embolism is noted. Scattered coronary calcifications are noted as well. Mediastinum/Nodes: Thoracic  inlet is within normal limits. Right paratracheal and scattered hilar nodes are again seen stable from the prior exam felt to be reactive in nature. The esophagus as visualized is within normal limits. Lungs/Pleura: Small effusions are noted bilaterally. These of increased somewhat in the interval from the prior exam right greater than left similar to the prior study. Lungs demonstrates some patchy airspace opacities are noted consistent with multifocal pneumonia. These  have increased from the prior exam. Upper Abdomen: Visualized upper abdomen is within normal limits. Musculoskeletal: Degenerative changes of the thoracic spine are noted. No acute rib abnormality is seen. Review of the MIP images confirms the above findings. IMPRESSION: No evidence of pulmonary emboli. Slight increase in pleural effusions when compared with the prior exam right greater than left. New patchy airspace opacities likely representing early parenchymal infiltrates. Scattered stable lymph nodes in the hila and mediastinum again likely reactive in nature. Aortic Atherosclerosis (ICD10-I70.0). Electronically Signed   By: Inez Catalina M.D.   On: 02/03/2022 23:05   DG Chest 2 View  Result Date: 02/03/2022 CLINICAL DATA:  Dyspnea EXAM: CHEST - 2 VIEW COMPARISON:  12/12/2021 chest radiograph. FINDINGS: Three lead left subclavian pacemaker is stable in configuration. Stable cardiomediastinal silhouette with normal heart size. No pneumothorax. Trace bilateral pleural effusions. Mild diffuse prominence of the parahilar interstitial markings. Streaky mild bibasilar atelectasis. IMPRESSION: 1. Mild diffuse prominence of the parahilar interstitial markings, favor mild pulmonary edema. 2. Trace bilateral pleural effusions. 3. Streaky mild bibasilar atelectasis. Electronically Signed   By: Ilona Sorrel M.D.   On: 02/03/2022 13:50   CUP PACEART REMOTE DEVICE CHECK  Result Date: 01/22/2022 Scheduled remote reviewed. Normal device function.  The device estimates less than 3 months until [employer] Next remote 02/23/2022. Kathy Breach, RN, CCDS, [employer]   Microbiology: Results for orders placed or performed during the hospital encounter of 02/03/22  Resp Panel by RT-PCR (Flu A&B, Covid) Anterior Nasal Swab     Status: None   Collection Time: 02/03/22  1:15 PM   Specimen: Anterior Nasal Swab  Result Value Ref Range Status   SARS Coronavirus 2 by RT PCR NEGATIVE NEGATIVE Final    Comment:  (NOTE) SARS-CoV-2 target nucleic acids are NOT DETECTED.  The SARS-CoV-2 RNA is generally detectable in upper respiratory specimens during the acute phase of infection. The lowest concentration of SARS-CoV-2 viral copies this assay can detect is 138 copies/mL. A negative result does not preclude SARS-Cov-2 infection and should not be used as the sole basis for treatment or other patient management decisions. A negative result may occur with  improper specimen collection/handling, submission of specimen other than nasopharyngeal swab, presence of viral mutation(s) within the areas targeted by this assay, and inadequate number of viral copies(<138 copies/mL). A negative result must be combined with clinical observations, patient history, and epidemiological information. The expected result is Negative.  Fact Sheet for Patients:  EntrepreneurPulse.com.au  Fact Sheet for Healthcare Providers:  IncredibleEmployment.be  This test is no t yet approved or cleared by the [place] FDA and  has been authorized for detection and/or diagnosis of SARS-CoV-2 by FDA under an Emergency Use Authorization (EUA). This EUA will remain  in effect (meaning this test can be used) for the duration of the COVID-19 declaration under Section 564(b)(1) of the Act, 21 U.S.C.section 360bbb-3(b)(1), unless the authorization is terminated  or revoked sooner.       Influenza A by PCR NEGATIVE NEGATIVE Final   Influenza B by PCR NEGATIVE  NEGATIVE Final    Comment: (NOTE) The Xpert Xpress SARS-CoV-2/FLU/RSV plus assay is intended as an aid in the diagnosis of influenza from Nasopharyngeal swab specimens and should not be used as a sole basis for treatment. Nasal washings and aspirates are unacceptable for Xpert Xpress SARS-CoV-2/FLU/RSV testing.  Fact Sheet for Patients: EntrepreneurPulse.com.au  Fact Sheet for Healthcare  Providers: IncredibleEmployment.be  This test is not yet approved or cleared by the [place] FDA and has been authorized for detection and/or diagnosis of SARS-CoV-2 by FDA under an Emergency Use Authorization (EUA). This EUA will remain in effect (meaning this test can be used) for the duration of the COVID-19 declaration under Section 564(b)(1) of the Act, 21 U.S.C. section 360bbb-3(b)(1), unless the authorization is terminated or revoked.  Performed at [facility], [place] [address], [place], [place] [address]     Labs: CBC: Recent Labs  Lab 02/03/22 1314 02/04/22 0536 02/05/22 0259 02/06/22 0100 02/07/22 0031 02/08/22 0040  WBC 7.0 5.7 5.4 5.5 6.3 10.4  NEUTROABS 5.4  --  3.8 4.2 4.5 8.2*  HGB 10.2* 10.1* 9.7* 10.0* 10.4* 10.2*  HCT 34.2* 33.4* 30.6* 32.4* 33.5* 33.4*  MCV 111.4* 110.6* 107.7* 108.0* 107.0* 108.1*  PLT 512* 548* 518* 543* 578* 599*   Basic Metabolic Panel: Recent Labs  Lab 02/04/22 0940 02/05/22 0259 02/06/22 0100 02/07/22 0031 02/08/22 0040 02/09/22 0043 02/10/22 0031  NA  --  139 138 138 137 136 135  K  --  4.0 3.7 4.5 4.3 4.3 4.2  CL  --  102 103 99 100 101 100  CO2  --  26 24 26 25 25 25   GLUCOSE  --  109* 124* 92 93 100* 100*  BUN  --  18 18 20  24* 34* 41*  CREATININE  --  1.54* 1.45* 1.49* 1.68* 1.89* 1.79*  CALCIUM  --  8.6* 8.7* 9.2 8.8* 8.8* 8.8*  MG 2.0 1.8 1.8 2.1  --   --   --    Liver Function Tests: Recent Labs  Lab 02/03/22 1314 02/05/22 0259 02/06/22 0100 02/07/22 0031 02/08/22 0040  AST 14* 14* 13* 18 19  ALT 8 7 8 10 11   ALKPHOS 58 58 60 62 59  BILITOT 1.0 1.0 0.9 1.2 1.0  PROT 6.9 6.6 6.8 7.2 6.9  ALBUMIN 3.5 3.3* 3.4* 3.5 3.4*   CBG: Recent Labs  Lab 02/09/22 0632 02/09/22 1129 02/09/22 1543 02/09/22 2139 02/10/22 0619  GLUCAP 92 114* 86 127* 113*    Discharge time spent: greater than 30 minutes.  Signed: Tawni Millers, MD [facility] 02/10/2022

## 2022-02-10 NOTE — Progress Notes (Signed)
Rounding Note    Patient Name: William Villanueva Date of Encounter: 02/10/2022  [place] Cardiologist: Kirk Ruths, MD   Subjective   Pt denies CP or dyspnea  Inpatient Medications    Scheduled Meds:  allopurinol  300 mg Oral Daily   budesonide  0.5 mg Nebulization BID   carvedilol  3.125 mg Oral BID WC   colestipol  1 g Oral BID   cyanocobalamin  1,000 mcg Oral Daily   dicyclomine  20 mg Oral BID   enoxaparin (LOVENOX) injection  40 mg Subcutaneous Daily   folic acid  1 mg Oral Daily   furosemide  40 mg Oral Daily   guaiFENesin-dextromethorphan  5 mL Oral TID   hydroxyurea  1,000 mg Oral Daily   insulin glargine-yfgn  15 Units Subcutaneous Daily   losartan  25 mg Oral Daily   magnesium oxide  800 mg Oral BID   oxybutynin  10 mg Oral Daily   pantoprazole  40 mg Oral Daily   rosuvastatin  5 mg Oral Daily   spironolactone  12.5 mg Oral Daily   tamsulosin  0.4 mg Oral BID   Continuous Infusions:  PRN Meds: acetaminophen, benzonatate, ipratropium-albuterol, ondansetron (ZOFRAN) IV, mouth rinse, zolpidem   Vital Signs    Vitals:   02/09/22 1950 02/09/22 2017 02/10/22 0619 02/10/22 0815  BP: 100/61  110/62   Pulse: 83  94   Resp: 18  18   Temp: 98 F (36.7 C)  98 F (36.7 C)   TempSrc: Oral  Oral   SpO2: 93% 94% 90% 95%  Weight:   112 kg   Height:        Intake/Output Summary (Last 24 hours) at 02/10/2022 0856 Last data filed at 02/10/2022 0500 Gross per 24 hour  Intake 717 ml  Output 600 ml  Net 117 ml       02/10/2022    6:19 AM 02/09/2022    6:35 AM 02/08/2022    4:20 AM  Last 3 Weights  Weight (lbs) 247 lb 247 lb 3.2 oz 247 lb 14.4 oz  Weight (kg) 112.038 kg 112.129 kg 112.447 kg      Telemetry    Sinus with ventricular pacing  - Personally Reviewed   Physical Exam   GEN: NAD, obese Neck: Supple, no JVD Cardiac: RRR, no rub Respiratory: CTA; no wheeze MS: No edema Neuro:  No focal findings Psych: Normal affect   Labs     High Sensitivity Troponin:   Recent Labs  Lab 02/03/22 2023 02/03/22 2036  TROPONINIHS 21* 19*      Chemistry Recent Labs  Lab 02/05/22 0259 02/06/22 0100 02/07/22 0031 02/08/22 0040 02/09/22 0043 02/10/22 0031  NA 139 138 138 137 136 135  K 4.0 3.7 4.5 4.3 4.3 4.2  CL 102 103 99 100 101 100  CO2 26 24 26 25 25 25   GLUCOSE 109* 124* 92 93 100* 100*  BUN 18 18 20  24* 34* 41*  CREATININE 1.54* 1.45* 1.49* 1.68* 1.89* 1.79*  CALCIUM 8.6* 8.7* 9.2 8.8* 8.8* 8.8*  MG 1.8 1.8 2.1  --   --   --   PROT 6.6 6.8 7.2 6.9  --   --   ALBUMIN 3.3* 3.4* 3.5 3.4*  --   --   AST 14* 13* 18 19  --   --   ALT 7 8 10 11   --   --   ALKPHOS 58 60 62 59  --   --  BILITOT 1.0 0.9 1.2 1.0  --   --   GFRNONAA 48* 52* 50* 43* 38* 40*  ANIONGAP 11 11 13 12 10 10      Lipids  Recent Labs  Lab 02/06/22 0100  CHOL 70  TRIG 83  HDL 26*  LDLCALC 27  CHOLHDL 2.7     Hematology Recent Labs  Lab 02/06/22 0100 02/07/22 0031 02/08/22 0040  WBC 5.5 6.3 10.4  RBC 3.00* 3.13* 3.09*  HGB 10.0* 10.4* 10.2*  HCT 32.4* 33.5* 33.4*  MCV 108.0* 107.0* 108.1*  MCH 33.3 33.2 33.0  MCHC 30.9 31.0 30.5  RDW 16.5* 16.6* 16.6*  PLT 543* 578* 578*    BNP Recent Labs  Lab 02/03/22 1314 02/05/22 0259 02/06/22 0100  BNP 634.0* 475.2* 450.8*     DDimer  Recent Labs  Lab 02/03/22 2023  DDIMER 1.04*       Cardiac Studies   Echocardiogram 02/04/22 1. Left ventricular ejection fraction, by estimation, is 40%. The left  ventricle has moderately decreased function. The left ventricle  demonstrates global hypokinesis. The left ventricular internal cavity size  was severely dilated. Left ventricular  diastolic parameters are consistent with Grade II diastolic dysfunction  (pseudonormalization). Elevated left atrial pressure. The E/e' is 19.8.   2. Right ventricular systolic function is normal. The right ventricular  size is mildly enlarged. There is mildly elevated pulmonary artery   systolic pressure.   3. Left atrial size was severely dilated.   4. Right atrial size was mildly dilated.   5. The mitral valve is grossly normal. Mild mitral valve regurgitation.   6. The aortic valve is tricuspid. There is mild thickening of the aortic  valve. Aortic valve regurgitation is not visualized. Aortic valve  sclerosis is present, with no evidence of aortic valve stenosis.   7. The inferior vena cava is dilated in size with >50% respiratory  variability, suggesting right atrial pressure of 8 mmHg.   Comparison(s): Compared to prior TTE on 06/2020, the LV is now severely  dilated with EF ~40% (previously moderately dilated with EF 45-50%).   Patient Profile     71 y.o. male  with past medical history of chronic combined systolic/diastolic congestive heart failure, polycythemia vera, hemochromatosis, nonischemic cardiomyopathy, history of pacemaker, hypertension for evaluation of acute on chronic combined systolic/diastolic congestive heart failure.   Assessment & Plan    Acute on chronic combined systolic and diastolic heart failure  Nonischemic Cardiomyopathy  - Echocardiogram this admission showed EF 12%, grade 2 diastolic dysfunction, biatrial enlargement, mild mitral regurgitation.  -Patient not volume overloaded on examination today.  Creatinine mildly increased.  We will hold Lasix and spironolactone today and tomorrow and resume at 40 mg and 12.5 mg daily on Sunday.  He will weigh daily and take an additional 40 mg for weight gain of 2 to 3 pounds.  Check potassium and renal function on Tuesday.  I have not added an SGLT2 inhibitor due to patient's obesity/pannus/use of Hydrea and risk of infection.Follow-up with APP 2 to 4 weeks and me in 3 months.     History of PPM  -Follow-up with Dr. Caryl Comes after discharge.  HTN  -Blood pressure controlled.  Continue present medications.  HLD  -Continue statin.     For questions or updates, please contact [place] Please consult [email or web address] for contact info under     Signed, Kirk Ruths, MD  02/10/2022, 8:56 AM

## 2022-02-10 NOTE — Progress Notes (Signed)
Heart Failure Stewardship Pharmacist Progress Note   PCP: Antony Contras, MD PCP-Cardiologist: Kirk Ruths, MD    HPI:  71 yo M with PMH of polycythemia vera, CHF, HTN, and gout.  Follows with Dr. Stanford Breed with initial dx of CHF in 2013 with EF 35-40% with cath at that time showing mild nonobstructive CAD. cMRI in 2013 showed EF 34% with no hyperenhancement or scar tissue and no evidence of cardiac hemochromatosis. CRT-P placed in 2015. Last ECHO prior to this admission in 06/2020 showed EF 45-50%.   Presented to the ED on 9/29 with shortness of breath x 3 weeks, LE edema, orthopnea, and weight gain. Did not respond to increased lasix dose as outpatient. CXR with mild pulmonary edema and trace bilateral pleural effusions. CTA negative for PE. ECHO 9/30 showed LVEF 40%, G2DD, RV normal, mildly elevated PA pressures, and mild MR.   Discharge HF Medications: Diuretic: furosemide 40 mg PO daily or 40 mg BID with weight gain Beta Blocker: carvedilol 3.125 mg BID ACE/ARB/ARNI: losartan 25 mg daily MRA: spironolactone 12.5 mg daily  Prior to admission HF Medications: Diuretic: furosemide 40 mg daily Beta blocker: carvedilol 3.125 mg BID ACE/ARB/ARNI: losartan 12.5 mg daily MRA: spironolactone 12.5 mg daily  Pertinent Lab Values: Serum creatinine 1.79, BUN 41, Potassium 4.2, Sodium 135, BNP 634>450.8, Magnesium 2.1, A1c 5.4   Vital Signs: Weight: 247 lbs (admission weight: 255 lbs) Blood pressure: 110/50s  Heart rate: 90-100s  I/O: -240m yesterday; net even  Medication Assistance / Insurance Benefits Check: Does the patient have prescription insurance?  Yes [facility]   Outpatient Pharmacy:  [facility] Yes Is the patient [facility]   Pending    Assessment: 1. Acute on  chronic systolic CHF (LVEF 484%, due to NICM. NYHA class II symptoms. - Agree with discharging on furosemide 40 mg PO daily and 40 mg BID for weight gain. I/Os seem incomplete. Strict I/Os and daily weights. Keep K>4 and Mag>2. - Continue carvedilol 3.125 mg BID - Continue losartan 25 mg daily. Stopped Entresto in 2022 because of hypotension. - Continue spironolactone 12.5 mg daily - SGLT2i not added due to use of Hydrea and pannus/risk of infection   Plan: 1) Medication changes recommended at this time: - Continue current regimen  2) Patient assistance: - None pending  3)  Education  - Patient has been educated on current HF medications and potential additions to HF medication regimen - Patient verbalizes understanding that over the next few months, these medication doses may change and more medications may be added to optimize HF regimen - Patient has been educated on basic disease state pathophysiology and goals of therapy   MKerby Nora PharmD, BCPS Heart Failure Stewardship Pharmacist Phone ([phone]

## 2022-02-10 NOTE — Progress Notes (Signed)
   02/10/22 1000  Mobility  Activity Ambulated with assistance in hallway  Activity Response Tolerated well  Distance Ambulated (ft) 115 ft  $Mobility charge 1 Mobility  Level of Assistance Standby assist, set-up cues, supervision of patient - no hands on  Assistive Device Cane  Mobility Referral Yes   Mobility Specialist Progress Note  Pt in chair and agreeable. X1 standing break d/t fatigue and SOB. Returned to chair w/ all needs met and call bell in reach.   William Villanueva Mobility Specialist

## 2022-02-13 ENCOUNTER — Other Ambulatory Visit: Payer: Medicare Other

## 2022-02-13 ENCOUNTER — Ambulatory Visit: Payer: Medicare Other | Admitting: Hematology & Oncology

## 2022-02-13 ENCOUNTER — Telehealth: Payer: Self-pay | Admitting: Cardiology

## 2022-02-13 NOTE — Telephone Encounter (Signed)
Wife calling bout the fluid that was surrounding the patient heart. He was in the hospital but was release. Wife states he is not getting any better. Please advise

## 2022-02-13 NOTE — Telephone Encounter (Signed)
Returned the call to the patient and his wife. She was calling to see if the patient needs to keep his oxygen on during the day. She stated that she is unsure what to do concerning the patient since discharge on 10/6 and needs education. She stated that when the patient takes his oxygen off, his O2 goes down to 88-89. When he puts it back on, his level goes up to 93%. She has been provided education on the patient and oxygen levels. She has been advised that he may need to keep his oxygen on during the day to keep his levels up.   The patient has been coughing up green phlegm as well. She was advised to call his PCP and get a follow up with them as well. He denies a fever and worsening shortness of breath. She wanted to know if Dr. Stanford Breed would prescribe an antibiotic and has, again, been advised to call for a follow up with PCP.   She stated that his swelling and shortness of breath is getting better. He has lost 4 pounds since the discharge.   She has been advised to keep the appointment with Coletta Memos on 10/17 and to call back with anymore concerns and questions.

## 2022-02-13 NOTE — Telephone Encounter (Signed)
Spoke with pt wife, Aware of dr Jacalyn Lefevre recommendations.

## 2022-02-14 ENCOUNTER — Ambulatory Visit (HOSPITAL_BASED_OUTPATIENT_CLINIC_OR_DEPARTMENT_OTHER): Payer: Medicare Other

## 2022-02-14 ENCOUNTER — Emergency Department (HOSPITAL_BASED_OUTPATIENT_CLINIC_OR_DEPARTMENT_OTHER): Payer: Medicare Other

## 2022-02-14 ENCOUNTER — Emergency Department (HOSPITAL_BASED_OUTPATIENT_CLINIC_OR_DEPARTMENT_OTHER)
Admission: EM | Admit: 2022-02-14 | Discharge: 2022-02-14 | Disposition: A | Discharge status: Home / Self Care | Payer: Medicare Other | Attending: Emergency Medicine | Admitting: Emergency Medicine

## 2022-02-14 ENCOUNTER — Encounter (HOSPITAL_BASED_OUTPATIENT_CLINIC_OR_DEPARTMENT_OTHER): Payer: Self-pay

## 2022-02-14 ENCOUNTER — Encounter: Payer: Self-pay | Admitting: Hematology & Oncology

## 2022-02-14 ENCOUNTER — Other Ambulatory Visit (HOSPITAL_BASED_OUTPATIENT_CLINIC_OR_DEPARTMENT_OTHER): Payer: Self-pay

## 2022-02-14 ENCOUNTER — Other Ambulatory Visit: Payer: Self-pay

## 2022-02-14 DIAGNOSIS — I110 Hypertensive heart disease with heart failure: Secondary | ICD-10-CM | POA: Diagnosis not present

## 2022-02-14 DIAGNOSIS — Z794 Long term (current) use of insulin: Secondary | ICD-10-CM | POA: Diagnosis not present

## 2022-02-14 DIAGNOSIS — M25512 Pain in left shoulder: Secondary | ICD-10-CM | POA: Diagnosis not present

## 2022-02-14 DIAGNOSIS — R5383 Other fatigue: Secondary | ICD-10-CM | POA: Insufficient documentation

## 2022-02-14 DIAGNOSIS — I509 Heart failure, unspecified: Secondary | ICD-10-CM | POA: Diagnosis not present

## 2022-02-14 DIAGNOSIS — R600 Localized edema: Secondary | ICD-10-CM | POA: Diagnosis not present

## 2022-02-14 DIAGNOSIS — Z950 Presence of cardiac pacemaker: Secondary | ICD-10-CM | POA: Diagnosis not present

## 2022-02-14 DIAGNOSIS — Z20822 Contact with and (suspected) exposure to covid-19: Secondary | ICD-10-CM | POA: Insufficient documentation

## 2022-02-14 DIAGNOSIS — R059 Cough, unspecified: Secondary | ICD-10-CM | POA: Diagnosis not present

## 2022-02-14 DIAGNOSIS — Z95810 Presence of automatic (implantable) cardiac defibrillator: Secondary | ICD-10-CM | POA: Diagnosis not present

## 2022-02-14 DIAGNOSIS — Z79899 Other long term (current) drug therapy: Secondary | ICD-10-CM | POA: Insufficient documentation

## 2022-02-14 DIAGNOSIS — R0602 Shortness of breath: Secondary | ICD-10-CM | POA: Diagnosis not present

## 2022-02-14 DIAGNOSIS — M19012 Primary osteoarthritis, left shoulder: Secondary | ICD-10-CM | POA: Diagnosis not present

## 2022-02-14 DIAGNOSIS — R0682 Tachypnea, not elsewhere classified: Secondary | ICD-10-CM | POA: Insufficient documentation

## 2022-02-14 LAB — BRAIN NATRIURETIC PEPTIDE: B Natriuretic Peptide: 309.1 pg/mL — ABNORMAL HIGH (ref 0.0–100.0)

## 2022-02-14 LAB — CBC WITH DIFFERENTIAL/PLATELET
Abs Immature Granulocytes: 0.17 10*3/uL — ABNORMAL HIGH (ref 0.00–0.07)
Basophils Absolute: 0.2 10*3/uL — ABNORMAL HIGH (ref 0.0–0.1)
Basophils Relative: 2 %
Eosinophils Absolute: 0.1 10*3/uL (ref 0.0–0.5)
Eosinophils Relative: 1 %
HCT: 34.3 % — ABNORMAL LOW (ref 39.0–52.0)
Hemoglobin: 10.4 g/dL — ABNORMAL LOW (ref 13.0–17.0)
Immature Granulocytes: 2 %
Lymphocytes Relative: 7 %
Lymphs Abs: 0.7 10*3/uL (ref 0.7–4.0)
MCH: 32.8 pg (ref 26.0–34.0)
MCHC: 30.3 g/dL (ref 30.0–36.0)
MCV: 108.2 fL — ABNORMAL HIGH (ref 80.0–100.0)
Monocytes Absolute: 0.7 10*3/uL (ref 0.1–1.0)
Monocytes Relative: 7 %
Neutro Abs: 8.2 10*3/uL — ABNORMAL HIGH (ref 1.7–7.7)
Neutrophils Relative %: 81 %
Platelets: 817 10*3/uL — ABNORMAL HIGH (ref 150–400)
RBC: 3.17 MIL/uL — ABNORMAL LOW (ref 4.22–5.81)
RDW: 16.9 % — ABNORMAL HIGH (ref 11.5–15.5)
WBC: 10 10*3/uL (ref 4.0–10.5)
nRBC: 0 % (ref 0.0–0.2)

## 2022-02-14 LAB — RESP PANEL BY RT-PCR (FLU A&B, COVID) ARPGX2
Influenza A by PCR: NEGATIVE
Influenza B by PCR: NEGATIVE
SARS Coronavirus 2 by RT PCR: NEGATIVE

## 2022-02-14 LAB — BASIC METABOLIC PANEL
Anion gap: 9 (ref 5–15)
BUN: 33 mg/dL — ABNORMAL HIGH (ref 8–23)
CO2: 25 mmol/L (ref 22–32)
Calcium: 8.6 mg/dL — ABNORMAL LOW (ref 8.9–10.3)
Chloride: 103 mmol/L (ref 98–111)
Creatinine, Ser: 1.44 mg/dL — ABNORMAL HIGH (ref 0.61–1.24)
GFR, Estimated: 52 mL/min — ABNORMAL LOW (ref >60)
Glucose, Bld: 112 mg/dL — ABNORMAL HIGH (ref 70–99)
Potassium: 3.9 mmol/L (ref 3.5–5.1)
Sodium: 137 mmol/L (ref 135–145)

## 2022-02-14 LAB — LACTIC ACID, PLASMA: Lactic Acid, Venous: 1 mmol/L (ref 0.5–1.9)

## 2022-02-14 LAB — TROPONIN I (HIGH SENSITIVITY): Troponin I (High Sensitivity): 22 ng/L — ABNORMAL HIGH (ref <18)

## 2022-02-14 MED ORDER — DOXYCYCLINE HYCLATE 50 MG PO CAPS
100.0000 mg | ORAL_CAPSULE | Freq: Two times a day (BID) | ORAL | 0 refills | Status: AC
  Filled 2022-02-14: qty 28, 7d supply, fill #0

## 2022-02-14 MED ORDER — OXYCODONE HCL 5 MG PO TABS
5.0000 mg | ORAL_TABLET | Freq: Once | ORAL | Status: AC
  Administered 2022-02-14: 5 mg via ORAL
  Filled 2022-02-14: qty 1

## 2022-02-14 MED ORDER — ACETAMINOPHEN 500 MG PO TABS
1000.0000 mg | ORAL_TABLET | Freq: Once | ORAL | Status: AC
  Administered 2022-02-14: 1000 mg via ORAL
  Filled 2022-02-14: qty 2

## 2022-02-14 MED ORDER — DICLOFENAC SODIUM 1 % EX GEL
2.0000 g | Freq: Four times a day (QID) | CUTANEOUS | 0 refills | Status: DC
  Filled 2022-02-14: qty 200, 25d supply, fill #0

## 2022-02-14 NOTE — ED Notes (Signed)
Pt insists on taking off oxygen until he gets home.  Instructed pt that if he gets sob to place it back on.  He agreed.  Discharged via wheelchair

## 2022-02-14 NOTE — ED Provider Notes (Signed)
[facility]ENCY DEPARTMENT Provider Note   CSN: [id number] Arrival date & time: 02/14/22  0800     History  Chief Complaint  Patient presents with   Cough   Shoulder Pain    William Villanueva is a 71 y.o. male.  Patient with history of CHF, HTN, and AV block s/p pacemaker presents with ongoing cough and left shoulder pain. Cough has been for 2 weeks and productive with yellow sputum. Associated with shortness of breath. Denies fever, chills, n/v. He is on Robitussin which helps with the cough. He is on 2L supplemental oxygen at home. He also presents with left shoulder pain for 1 month. Pain with all ROM with increased pain that started last night. Denies recent trauma or injury. No numbness or weakness in left arm. He was recently hospitalized for CHF exacerbation. He states he is losing weight with decreased peripheral edema.   The history is provided by the patient.  Cough Associated symptoms: shortness of breath   Associated symptoms: no chills and no fever   Shoulder Pain Associated symptoms: fatigue   Associated symptoms: no fever        Home Medications Prior to Admission medications   Medication Sig Start Date End Date Taking? Authorizing Provider  chlorzoxazone (PARAFON) 500 MG tablet Take 500 mg by mouth 3 (three) times daily. 10/17/21  Yes [provider]  diclofenac Sodium (VOLTAREN) 1 % GEL Apply 2 g topically 4 (four) times daily. Apply to left shoulder for pain. 02/14/22  Yes Angelique Blonder, DO  doxycycline (VIBRAMYCIN) 50 MG capsule Take 2 capsules (100 mg total) by mouth 2 (two) times daily for 7 days. 02/14/22 02/21/22 Yes Angelique Blonder, DO  oxybutynin (DITROPAN XL) 15 MG 24 hr tablet Take 15 mg by mouth daily. 02/06/22  Yes [provider]  acetaminophen (TYLENOL) 500 MG tablet Take 1,000 mg by mouth every 6 (six) hours as needed for mild pain.    [provider]  allopurinol (ZYLOPRIM) 300 MG tablet Take 300 mg by  mouth daily. 06/30/19   [provider]  budesonide (PULMICORT) 0.5 MG/2ML nebulizer solution Inhale one vial in nebulizer twice a day. **Rinse mouth after use. Patient taking differently: Take 0.5 mg by nebulization 2 (two) times daily. **Rinse mouth after use.** 12/05/21   Margaretha Seeds, MD  carvedilol (COREG) 3.125 MG tablet Take 1 tablet (3.125 mg total) by mouth 2 (two) times daily with a meal. 08/19/21   Deboraha Sprang, MD  colestipol (COLESTID) 1 g tablet Take 1 g by mouth 2 (two) times daily. 04/24/20   [provider]  Cyanocobalamin (B-12 PO) Take 1 tablet by mouth daily.    [provider]  dicyclomine (BENTYL) 20 MG tablet Take 20 mg by mouth 2 (two) times daily. 03/15/20   [provider]  folic acid (FOLVITE) 1 MG tablet Take 1 mg by mouth daily.    [provider]  furosemide (LASIX) 40 MG tablet Take 1 tablet (40 mg total) by mouth daily. Take 1 tablet twice a day in case of weight gain 2 to 3 lbs in 24 hrs or 5 lbs in 7 days, until weight back to baseline. 02/12/22   Arrien, Jimmy Picket, MD  guaiFENesin-dextromethorphan (ROBITUSSIN DM) 100-10 MG/5ML syrup Take 5 mLs by mouth every 6 (six) hours as needed for cough. 02/10/22   Arrien, Jimmy Picket, MD  hydroxyurea (HYDREA) 500 MG capsule Take 2 capsules (1,000 mg total) by mouth daily. May take with  food to minimize GI side effects. Currently taking 500 mg daily but MD note says may need to increase on 12/27/2020 to twice daily. Patient taking differently: Take 1,000 mg by mouth daily. May take with food to minimize GI side effects. 12/17/20   Volanda Napoleon, MD  insulin degludec (TRESIBA FLEXTOUCH) 100 UNIT/ML SOPN FlexTouch Pen Inject 20 Units into the skin daily after breakfast.     [provider]  ipratropium-albuterol (DUONEB) 0.5-2.5 (3) MG/3ML SOLN Inhale one vial in nebulizer 3 times a day and every 6 hrs as needed. Patient taking differently: Take 3 mLs by  nebulization 2 (two) times daily. 12/02/21   Margaretha Seeds, MD  losartan (COZAAR) 25 MG tablet Take 1 tablet (25 mg total) by mouth daily. 02/10/22 03/12/22  Arrien, Jimmy Picket, MD  Magnesium Oxide 400 MG CAPS Take 2 capsules (800 mg total) by mouth in the morning and at bedtime. 04/26/20   Deboraha Sprang, MD  omeprazole (PRILOSEC) 20 MG capsule Take 20 mg by mouth 2 (two) times daily before a meal.  07/05/16   [provider]  rosuvastatin (CRESTOR) 5 MG tablet Take 5 mg by mouth daily.    [provider]  spironolactone (ALDACTONE) 25 MG tablet Take 0.5 tablets (12.5 mg total) by mouth daily. 02/12/22   Arrien, Jimmy Picket, MD  tamsulosin (FLOMAX) 0.4 MG CAPS capsule Take 0.4 mg by mouth 2 (two) times daily.  08/03/17   [provider]  Turmeric (QC TUMERIC COMPLEX) 500 MG CAPS Take 500 mg by mouth daily.    [provider]  zolpidem (AMBIEN) 5 MG tablet Take 1 tablet (5 mg total) by mouth at bedtime as needed for sleep. 02/10/22   Arrien, Jimmy Picket, MD      Allergies    Bupropion, Cephalexin, Fluoxetine, Ibuprofen, Prednisone, Temazepam, Trazodone and nefazodone, Fluoxetine hcl, and Prozac [fluoxetine hcl]    Review of Systems   Review of Systems  Constitutional:  Positive for fatigue. Negative for chills and fever.  Respiratory:  Positive for cough and shortness of breath.   Cardiovascular:  Positive for leg swelling.  Gastrointestinal:  Negative for nausea and vomiting.  Musculoskeletal:        Left shoulder pain    Physical Exam Updated Vital Signs BP 131/72   Pulse 79   Temp 97.6 F (36.4 C)   Resp (!) 23   Ht 5' 10"  (1.778 m)   Wt 111.1 kg   SpO2 97%   BMI 35.15 kg/m  Physical Exam HENT:     Head: Normocephalic and atraumatic.  Cardiovascular:     Rate and Rhythm: Normal rate and regular rhythm.  Pulmonary:     Effort: Tachypnea present.     Breath sounds: Examination of the right-lower field reveals rales.  Examination of the left-lower field reveals rales. Rales (faint rales) present.  Musculoskeletal:     Right lower leg: Edema present.     Left lower leg: Edema present.  Neurological:     Mental Status: He is alert.     ED Results / Procedures / Treatments   Labs (all labs ordered are listed, but only abnormal results are displayed) Labs Reviewed  BASIC METABOLIC PANEL - Abnormal; Notable for the following components:      Result Value   Glucose, Bld 112 (*)    BUN 33 (*)    Creatinine, Ser 1.44 (*)    Calcium 8.6 (*)    GFR, Estimated 52 (*)  All other components within normal limits  BRAIN NATRIURETIC PEPTIDE - Abnormal; Notable for the following components:   B Natriuretic Peptide 309.1 (*)    All other components within normal limits  CBC WITH DIFFERENTIAL/PLATELET - Abnormal; Notable for the following components:   RBC 3.17 (*)    Hemoglobin 10.4 (*)    HCT 34.3 (*)    MCV 108.2 (*)    RDW 16.9 (*)    Platelets 817 (*)    Neutro Abs 8.2 (*)    Basophils Absolute 0.2 (*)    Abs Immature Granulocytes 0.17 (*)    All other components within normal limits  TROPONIN I (HIGH SENSITIVITY) - Abnormal; Notable for the following components:   Troponin I (High Sensitivity) 22 (*)    All other components within normal limits  RESP PANEL BY RT-PCR (FLU A&B, COVID) ARPGX2  CULTURE, BLOOD (ROUTINE X 2)  CULTURE, BLOOD (SINGLE)  LACTIC ACID, PLASMA  LACTIC ACID, PLASMA  TROPONIN I (HIGH SENSITIVITY)    EKG EKG Interpretation  Date/Time:  Tuesday February 14 2022 08:21:08 EDT Ventricular Rate:  88 PR Interval:  60 QRS Duration: 170 QT Interval:  478 QTC Calculation: 576 R Axis:   256 Text Interpretation: Atrial-sensed ventricular-paced complexes No further analysis attempted due to paced rhythm Baseline wander in lead(s) V3 No significant change since last tracing Confirmed by Deno Etienne [phone]) on 02/14/2022 8:30:03 AM  Radiology DG Shoulder Left  Result Date:  02/14/2022 CLINICAL DATA:  Left shoulder pain for 6 weeks EXAM: LEFT SHOULDER - 2+ VIEW COMPARISON:  None Available. FINDINGS: Mild inferior left glenoid degenerative spurring. Moderate acromioclavicular joint space narrowing and peripheral osteophytosis. Mild lateral downsloping of the acromion. Partial visualization of left chest wall cardiac AICD. No acute fracture or dislocation. IMPRESSION: Moderate acromioclavicular and mild glenohumeral osteoarthritis. Electronically Signed   By: Yvonne Kendall M.D.   On: 02/14/2022 09:22   DG Chest Port 1 View  Result Date: 02/14/2022 CLINICAL DATA:  Cough. EXAM: PORTABLE CHEST 1 VIEW COMPARISON:  Chest x-ray 02/03/2022. FINDINGS: Subtle bibasilar opacities. No visible pleural effusions or pneumothorax. Cardiomediastinal silhouette is within normal limits. Left subclavian approach cardiac rhythm maintenance device. No acute osseous abnormality. IMPRESSION: Subtle bibasilar opacities, which could represent atelectasis and/or early pneumonia. Electronically Signed   By: Margaretha Sheffield M.D.   On: 02/14/2022 09:16    Procedures Procedures   Medications Ordered in ED Medications  acetaminophen (TYLENOL) tablet 1,000 mg (1,000 mg Oral Given 02/14/22 1000)  oxyCODONE (Oxy IR/ROXICODONE) immediate release tablet 5 mg (5 mg Oral Given 02/14/22 1000)    ED Course/ Medical Decision Making/ A&P                           Medical Decision Making Amount and/or Complexity of Data Reviewed Labs: ordered. Radiology: ordered.  Risk OTC drugs. Prescription drug management.  Patient presents with cough and left shoulder pain.  COVID and flu negative. BMP, BNP, troponin and CBC relatively unchanged from labs from recent hospital admission. Lactic acid normal. CXR showed subtle bibasilar atelectasis which could suggest early pneumonia.  Compared to previous CXR, decreased pleural effusion.  X-ray of left shoulder showed osteoarthritis. O2 sats stable on 2L [place]  supplemental oxygen. After shared decision making with the patient at bedside, will start a course of oral antibiotics.  Discussed taking Tylenol and Voltaren gel for left shoulder pain. Will need close follow-up with PCP and cardiology.  Strict return precautions  given.   Final Clinical Impression(s) / ED Diagnoses Final diagnoses:  Cough, unspecified type  Left shoulder pain, unspecified chronicity    Rx / DC Orders ED Discharge Orders          Ordered    diclofenac Sodium (VOLTAREN) 1 % GEL  4 times daily       Note to Pharmacy: Per MD, 2 boxes not 2 g   02/14/22 1007    doxycycline (VIBRAMYCIN) 50 MG capsule  2 times daily        02/14/22 1007              Angelique Blonder, DO 02/14/22 [place], [place], DO 02/14/22 1242

## 2022-02-14 NOTE — ED Triage Notes (Addendum)
Pt arrives POV with wife. Reports sent by Dr Stanford Breed due to cough for 2 weeks . Coughing up yellow/green mucous. On conts 02 2 L via [place] since hospital admission. Reports SOB . Also reports left shoulder pain for 6 weeks pain has been getting worse and can't move arm due to pain today.

## 2022-02-14 NOTE — Discharge Instructions (Addendum)
You were seen for cough and left shoulder pain. Please take doxycycline 100 mg twice daily for 7 days. Also, sent in voltaren gel for your left shoulder pain as needed. Please follow up with your primary care doctor. If symptoms worsen or you develop fever or chills, please return for evaluation.

## 2022-02-15 ENCOUNTER — Other Ambulatory Visit (HOSPITAL_BASED_OUTPATIENT_CLINIC_OR_DEPARTMENT_OTHER): Payer: Self-pay

## 2022-02-16 ENCOUNTER — Inpatient Hospital Stay: Payer: Medicare Other | Admitting: Hematology & Oncology

## 2022-02-16 ENCOUNTER — Other Ambulatory Visit: Payer: Self-pay

## 2022-02-16 ENCOUNTER — Inpatient Hospital Stay: Payer: Medicare Other

## 2022-02-16 ENCOUNTER — Inpatient Hospital Stay: Payer: Medicare Other | Attending: Hematology & Oncology

## 2022-02-16 ENCOUNTER — Other Ambulatory Visit (HOSPITAL_COMMUNITY): Payer: Self-pay

## 2022-02-16 ENCOUNTER — Encounter: Payer: Self-pay | Admitting: Hematology & Oncology

## 2022-02-16 VITALS — BP 97/46 | HR 67 | Temp 97.6°F | Resp 20 | Ht 70.0 in | Wt 247.0 lb

## 2022-02-16 VITALS — BP 118/58

## 2022-02-16 DIAGNOSIS — D45 Polycythemia vera: Secondary | ICD-10-CM

## 2022-02-16 DIAGNOSIS — D649 Anemia, unspecified: Secondary | ICD-10-CM | POA: Diagnosis not present

## 2022-02-16 DIAGNOSIS — Z7982 Long term (current) use of aspirin: Secondary | ICD-10-CM | POA: Insufficient documentation

## 2022-02-16 DIAGNOSIS — I441 Atrioventricular block, second degree: Secondary | ICD-10-CM | POA: Diagnosis not present

## 2022-02-16 DIAGNOSIS — J441 Chronic obstructive pulmonary disease with (acute) exacerbation: Secondary | ICD-10-CM

## 2022-02-16 DIAGNOSIS — R059 Cough, unspecified: Secondary | ICD-10-CM | POA: Insufficient documentation

## 2022-02-16 DIAGNOSIS — R0602 Shortness of breath: Secondary | ICD-10-CM

## 2022-02-16 LAB — CBC WITH DIFFERENTIAL (CANCER CENTER ONLY)
Abs Immature Granulocytes: 0.18 10*3/uL — ABNORMAL HIGH (ref 0.00–0.07)
Basophils Absolute: 0.1 10*3/uL (ref 0.0–0.1)
Basophils Relative: 2 %
Eosinophils Absolute: 0.1 10*3/uL (ref 0.0–0.5)
Eosinophils Relative: 1 %
HCT: 32.4 % — ABNORMAL LOW (ref 39.0–52.0)
Hemoglobin: 10.1 g/dL — ABNORMAL LOW (ref 13.0–17.0)
Immature Granulocytes: 2 %
Lymphocytes Relative: 10 %
Lymphs Abs: 0.8 10*3/uL (ref 0.7–4.0)
MCH: 33.1 pg (ref 26.0–34.0)
MCHC: 31.2 g/dL (ref 30.0–36.0)
MCV: 106.2 fL — ABNORMAL HIGH (ref 80.0–100.0)
Monocytes Absolute: 0.6 10*3/uL (ref 0.1–1.0)
Monocytes Relative: 7 %
Neutro Abs: 6.1 10*3/uL (ref 1.7–7.7)
Neutrophils Relative %: 78 %
Platelet Count: 704 10*3/uL — ABNORMAL HIGH (ref 150–400)
RBC: 3.05 MIL/uL — ABNORMAL LOW (ref 4.22–5.81)
RDW: 16.5 % — ABNORMAL HIGH (ref 11.5–15.5)
WBC Count: 7.8 10*3/uL (ref 4.0–10.5)
nRBC: 0 % (ref 0.0–0.2)

## 2022-02-16 LAB — RETICULOCYTES
Immature Retic Fract: 16.7 % — ABNORMAL HIGH (ref 2.3–15.9)
RBC.: 3.03 MIL/uL — ABNORMAL LOW (ref 4.22–5.81)
Retic Count, Absolute: 33.3 10*3/uL (ref 19.0–186.0)
Retic Ct Pct: 1.1 % (ref 0.4–3.1)

## 2022-02-16 LAB — CMP (CANCER CENTER ONLY)
ALT: 6 U/L (ref 0–44)
AST: 12 U/L — ABNORMAL LOW (ref 15–41)
Albumin: 3.9 g/dL (ref 3.5–5.0)
Alkaline Phosphatase: 57 U/L (ref 38–126)
Anion gap: 9 (ref 5–15)
BUN: 31 mg/dL — ABNORMAL HIGH (ref 8–23)
CO2: 24 mmol/L (ref 22–32)
Calcium: 9.3 mg/dL (ref 8.9–10.3)
Chloride: 103 mmol/L (ref 98–111)
Creatinine: 1.46 mg/dL — ABNORMAL HIGH (ref 0.61–1.24)
GFR, Estimated: 51 mL/min — ABNORMAL LOW (ref >60)
Glucose, Bld: 110 mg/dL — ABNORMAL HIGH (ref 70–99)
Potassium: 4.1 mmol/L (ref 3.5–5.1)
Sodium: 136 mmol/L (ref 135–145)
Total Bilirubin: 0.4 mg/dL (ref 0.3–1.2)
Total Protein: 7.4 g/dL (ref 6.5–8.1)

## 2022-02-16 LAB — LACTATE DEHYDROGENASE: LDH: 298 U/L — ABNORMAL HIGH (ref 98–192)

## 2022-02-16 LAB — IRON AND IRON BINDING CAPACITY (CC-WL,HP ONLY)
Iron: 54 ug/dL (ref 45–182)
Saturation Ratios: 18 % (ref 17.9–39.5)
TIBC: 294 ug/dL (ref 250–450)
UIBC: 240 ug/dL (ref 117–376)

## 2022-02-16 LAB — FERRITIN: Ferritin: 119 ng/mL (ref 24–336)

## 2022-02-16 MED ORDER — DEXTROSE 5 % IV SOLN
2.0000 g | Freq: Once | INTRAVENOUS | Status: AC
  Administered 2022-02-16: 2 g via INTRAVENOUS
  Filled 2022-02-16: qty 20

## 2022-02-16 MED ORDER — SODIUM CHLORIDE 0.9 % IV SOLN: INTRAVENOUS | Status: DC

## 2022-02-16 NOTE — Patient Instructions (Signed)
Ceftriaxone Injection What is this medication? CEFTRIAXONE (sef try AX one) treats infections caused by bacteria. It belongs to a group of medications called cephalosporin antibiotics. It will not treat colds, the flu, or infections caused by viruses. This medicine may be used for other purposes; ask your health care provider or pharmacist if you have questions. COMMON BRAND NAME(S): Ceftrisol Plus, Rocephin What should I tell my care team before I take this medication? They need to know if you have any of these conditions: Bleeding disorder High bilirubin level in newborn patients Kidney disease Liver disease Poor nutrition An unusual or allergic reaction to ceftriaxone, other penicillin or cephalosporin antibiotics, other medications, foods, dyes, or preservatives Pregnant or trying to get pregnant Breast-feeding How should I use this medication? This medication is injected into a vein or a muscle. It is usually given by your care team in a hospital or clinic setting. It may also be given at home. If you get this medication at home, you will be taught how to prepare and give it. Use exactly as directed. Take it as directed on the prescription label at the same time every day. Keep taking it even if you think you are better. It is important that you put your used needles and syringes in a special sharps container. Do not put them in a trash can. If you do not have a sharps container, call your pharmacist or care team to get one. Talk to your care team about the use of this medication in children. While it may be prescribed for children as young as newborns for selected conditions, precautions do apply. Overdosage: If you think you have taken too much of this medicine contact a poison control center or emergency room at once. NOTE: This medicine is only for you. Do not share this medicine with others. What if I miss a dose? If you get this medication at the hospital or clinic: It is important  not to miss your dose. Call your care team if you are unable to keep an appointment. If you give yourself this medication at home: If you miss a dose, take it as soon as you can. Then continue your normal schedule. If it is almost time for your next dose, take only that dose. Do not take double or extra doses. Call your care team with questions. What may interact with this medication? Estrogen or progestin hormones Intravenous calcium This list may not describe all possible interactions. Give your health care provider a list of all the medicines, herbs, non-prescription drugs, or dietary supplements you use. Also tell them if you smoke, drink alcohol, or use illegal drugs. Some items may interact with your medicine. What should I watch for while using this medication? Tell your care team if your symptoms do not start to get better or if they get worse. Do not treat diarrhea with over the counter products. Contact your care team if you have diarrhea that lasts more than 2 days or if it is severe and watery. If you have diabetes, you may get a false-positive result for sugar in your urine. Check with your care team. If you are being treated for a sexually transmitted infection (STI), avoid sexual contact until you have finished your treatment. Your partner may also need treatment. What side effects may I notice from receiving this medication? Side effects that you should report to your care team as soon as possible: Allergic reactions--skin rash, itching, hives, swelling of the face, lips, tongue, or throat  Hemolytic anemia--unusual weakness or fatigue, dizziness, headache, trouble breathing, dark urine, yellowing skin or eyes Severe diarrhea, fever Unusual vaginal discharge, itching, or odor Side effects that usually do not require medical attention (report to your care team if they continue or are bothersome): Diarrhea Headache Nausea Pain, redness, or irritation at injection site This list may  not describe all possible side effects. Call your doctor for medical advice about side effects. You may report side effects to FDA at 1-800-FDA-1088. Where should I keep my medication? Keep out of the reach of children and pets. You will be instructed on how to store this medication. Get rid of any unused medication after the expiration date. To get rid of medications that are no longer needed or have expired: Take the medication to a medication take-back program. Check with your pharmacy or law enforcement to find a location. If you cannot return the medication, ask your pharmacist or care team how to get rid of this medication safely. NOTE: This sheet is a summary. It may not cover all possible information. If you have questions about this medicine, talk to your doctor, pharmacist, or health care provider.  2023 Elsevier/Gold Standard (2021-07-04 00:00:00)

## 2022-02-16 NOTE — Progress Notes (Signed)
Hematology and Oncology Follow Up Visit  William Villanueva [id number] 10-Feb-1951 71 y.o. 02/16/2022   Principle Diagnosis:  Polycythemia vera-JAK2 positive Heart block-Mobitz II  Current Therapy:   Hydrea 500 mg p.o.TID -dose changed on 08/12/2021 Aspirin 81 mg by mouth daily Phlebotomy to maintain hematocrit below 45%     Interim History:  William Villanueva is back for followup.  He was just in the hospital.  He had heart failure.  An echocardiogram which showed an ejection fraction of 40%.  He apparently he had 14 pounds of fluid taken off of him.  He is on oxygen right now.  We may have to think about making a change in his medications.  He is on Hydrea right now.  His platelet count is on the high side even though it is coming down.  However, the Hydrea might be causing some issues with respect to anemia.  He is not that anemic but it could certainly be affecting his cardiac function.  I had to think about try him on anagrelide.  1 possibility for his anemia might be the iron deficiency.  Dr. Laurance Flatten saw him in September, his ferritin was 44 with an iron saturation of 16%.  We may want to think about giving him some IV iron.  There is been no problems with nausea or vomiting.  He is coughing up some greenish sputum right now.  I think that we should give him a dose of Rocephin.  I think 1 dose of IV Rocephin should not cause problems with diarrhea.  He has a pacemaker in.  This, at least, is keeping him in rhythm.  It sounds like the pacemaker battery may need to be changed later on this year.  He has had no worsening lower back pain.  He has some left shoulder pain.  I would have to say that overall, his performance status is probably ECOG 2.    Medications:  Current Outpatient Medications:    acetaminophen (TYLENOL) 500 MG tablet, Take 1,000 mg by mouth every 6 (six) hours as needed for mild pain., Disp: , Rfl:    allopurinol (ZYLOPRIM) 300 MG tablet, Take 300 mg by mouth daily.,  Disp: , Rfl:    budesonide (PULMICORT) 0.5 MG/2ML nebulizer solution, Inhale one vial in nebulizer twice a day. **Rinse mouth after use. (Patient taking differently: Take 0.5 mg by nebulization 2 (two) times daily. **Rinse mouth after use.**), Disp: 60 mL, Rfl: 10   carvedilol (COREG) 3.125 MG tablet, Take 1 tablet (3.125 mg total) by mouth 2 (two) times daily with a meal., Disp: 180 tablet, Rfl: 3   chlorzoxazone (PARAFON) 500 MG tablet, Take 500 mg by mouth 3 (three) times daily., Disp: , Rfl:    colestipol (COLESTID) 1 g tablet, Take 1 g by mouth 2 (two) times daily., Disp: , Rfl:    Cyanocobalamin (B-12 PO), Take 1 tablet by mouth daily., Disp: , Rfl:    diclofenac Sodium (VOLTAREN) 1 % GEL, Apply 2 g topically 4 (four) times daily. Apply to left shoulder for pain., Disp: 200 g, Rfl: 0   dicyclomine (BENTYL) 20 MG tablet, Take 20 mg by mouth 2 (two) times daily., Disp: , Rfl:    doxycycline (VIBRAMYCIN) 50 MG capsule, Take 2 capsules (100 mg total) by mouth 2 (two) times daily for 7 days., Disp: 28 capsule, Rfl: 0   folic acid (FOLVITE) 1 MG tablet, Take 1 mg by mouth daily., Disp: , Rfl:    furosemide (LASIX) 40 MG  tablet, Take 1 tablet (40 mg total) by mouth daily. Take 1 tablet twice a day in case of weight gain 2 to 3 lbs in 24 hrs or 5 lbs in 7 days, until weight back to baseline., Disp: 30 tablet, Rfl: 0   guaiFENesin-dextromethorphan (ROBITUSSIN DM) 100-10 MG/5ML syrup, Take 5 mLs by mouth every 6 (six) hours as needed for cough., Disp: 237 mL, Rfl: 0   hydroxyurea (HYDREA) 500 MG capsule, Take 2 capsules (1,000 mg total) by mouth daily. May take with food to minimize GI side effects. Currently taking 500 mg daily but MD note says may need to increase on 12/27/2020 to twice daily. (Patient taking differently: Take 1,000 mg by mouth daily. May take with food to minimize GI side effects.), Disp: 180 capsule, Rfl: 5   insulin degludec (TRESIBA FLEXTOUCH) 100 UNIT/ML SOPN FlexTouch Pen, Inject 20  Units into the skin daily after breakfast. , Disp: , Rfl:    ipratropium-albuterol (DUONEB) 0.5-2.5 (3) MG/3ML SOLN, Inhale one vial in nebulizer 3 times a day and every 6 hrs as needed. (Patient taking differently: Take 3 mLs by nebulization 2 (two) times daily.), Disp: 120 mL, Rfl: 11   losartan (COZAAR) 25 MG tablet, Take 1 tablet (25 mg total) by mouth daily., Disp: 30 tablet, Rfl: 0   Magnesium Oxide 400 MG CAPS, Take 2 capsules (800 mg total) by mouth in the morning and at bedtime., Disp: 90 capsule, Rfl: 1   omeprazole (PRILOSEC) 20 MG capsule, Take 20 mg by mouth 2 (two) times daily before a meal. , Disp: , Rfl:    oxybutynin (DITROPAN XL) 15 MG 24 hr tablet, Take 15 mg by mouth daily., Disp: , Rfl:    rosuvastatin (CRESTOR) 5 MG tablet, Take 5 mg by mouth daily., Disp: , Rfl:    spironolactone (ALDACTONE) 25 MG tablet, Take 0.5 tablets (12.5 mg total) by mouth daily., Disp: , Rfl:    tamsulosin (FLOMAX) 0.4 MG CAPS capsule, Take 0.4 mg by mouth 2 (two) times daily. , Disp: , Rfl:    Turmeric (QC TUMERIC COMPLEX) 500 MG CAPS, Take 500 mg by mouth daily., Disp: , Rfl:    zolpidem (AMBIEN) 5 MG tablet, Take 1 tablet (5 mg total) by mouth at bedtime as needed for sleep., Disp: 15 tablet, Rfl: 0 No current facility-administered medications for this visit.  Facility-Administered Medications Ordered in Other Visits:    0.9 %  sodium chloride infusion, , Intravenous, Continuous, Jemya Depierro, Rudell Cobb, MD, Stopped at 05/16/13 1115  Allergies:  Allergies  Allergen Reactions   Bupropion Hives and Other (See Comments)   Cephalexin Diarrhea and Other (See Comments)    Caused C-diff, also    Fluoxetine Itching   Ibuprofen Itching   Prednisone Itching and Other (See Comments)    Abdominal pain, also    Temazepam Other (See Comments)    Dizziness    Trazodone And Nefazodone Other (See Comments)    Dizziness   Fluoxetine Hcl Itching   Prozac [Fluoxetine Hcl] Itching    Past Medical History,  Surgical history, Social history, and Family History were reviewed and updated.  Review of Systems: Review of Systems  Constitutional: Negative.   HENT: Negative.    Eyes: Negative.   Respiratory:  Positive for shortness of breath.   Cardiovascular:  Positive for chest pain.  Gastrointestinal: Negative.   Genitourinary: Negative.   Musculoskeletal:  Positive for back pain.  Skin: Negative.   Neurological: Negative.   Endo/Heme/Allergies: Negative.  Psychiatric/Behavioral: Negative.      Physical Exam:  vitals were not taken for this visit.   Physical Exam Vitals reviewed.  HENT:     Head: Normocephalic and atraumatic.  Eyes:     Pupils: Pupils are equal, round, and reactive to light.  Cardiovascular:     Rate and Rhythm: Normal rate and regular rhythm.     Heart sounds: Normal heart sounds.  Pulmonary:     Effort: Pulmonary effort is normal.     Breath sounds: Normal breath sounds.  Abdominal:     General: Bowel sounds are normal.     Palpations: Abdomen is soft.  Musculoskeletal:        General: No tenderness or deformity. Normal range of motion.     Cervical back: Normal range of motion.  Lymphadenopathy:     Cervical: No cervical adenopathy.  Skin:    General: Skin is warm and dry.     Findings: No erythema or rash.  Neurological:     Mental Status: He is alert and oriented to person, place, and time.  Psychiatric:        Behavior: Behavior normal.        Thought Content: Thought content normal.        Judgment: Judgment normal.     Lab Results  Component Value Date   WBC 7.8 02/16/2022   HGB 10.1 (L) 02/16/2022   HCT 32.4 (L) 02/16/2022   MCV 106.2 (H) 02/16/2022   PLT 704 (H) 02/16/2022     Chemistry      Component Value Date/Time   NA 137 02/14/2022 0848   NA 140 01/26/2022 0828   NA 141 04/10/2017 0923   NA 140 04/05/2016 0739   K 3.9 02/14/2022 0848   K 3.5 04/10/2017 0923   K 4.2 04/05/2016 0739   CL 103 02/14/2022 0848   CL 108  04/10/2017 0923   CO2 25 02/14/2022 0848   CO2 24 04/10/2017 0923   CO2 20 (L) 04/05/2016 0739   BUN 33 (H) 02/14/2022 0848   BUN 18 01/26/2022 0828   BUN 12 04/10/2017 0923   BUN 14.7 04/05/2016 0739   CREATININE 1.44 (H) 02/14/2022 0848   CREATININE 1.08 11/24/2021 0801   CREATININE 1.1 04/10/2017 0923   CREATININE 1.1 04/05/2016 0739      Component Value Date/Time   CALCIUM 8.6 (L) 02/14/2022 0848   CALCIUM 8.4 04/10/2017 0923   CALCIUM 9.0 04/05/2016 0739   ALKPHOS 59 02/08/2022 0040   ALKPHOS 77 04/10/2017 0923   ALKPHOS 91 04/05/2016 0739   AST 19 02/08/2022 0040   AST 12 (L) 11/24/2021 0801   AST 34 04/05/2016 0739   ALT 11 02/08/2022 0040   ALT 8 11/24/2021 0801   ALT 32 04/10/2017 0923   ALT 40 04/05/2016 0739   BILITOT 1.0 02/08/2022 0040   BILITOT 0.6 11/24/2021 0801   BILITOT 0.48 04/05/2016 0739      Impression and Plan: William Villanueva is 71 year old gentleman with polycythemia vera.  Everything is doing okay from my point of view.  We have not had to phlebotomize him y for good 5 years.    Again, we will give him a dose of Rocephin today.  He is on some oral antibiotic.  I think the Rocephin will be able to try to help into his lungs little bit better to try to help this cough.  Again, I may think about making a change in his medications.  I would  like to see him back in about 4 or 5 weeks.  I think given the issues that are going on, we may have to follow him a little more closely.   Volanda Napoleon, MD 10/12/20238:15 AM

## 2022-02-16 NOTE — Progress Notes (Signed)
[facility] Transitions of Care Clinic Heart Failure Pharmacist Encounter  PCP: Antony Contras, MD PCP-Cardiologist: Kirk Ruths, MD  HPI:  [name] is a 71 YO male with PMH significant for CHF, gout, HTN, COPD, OSA not on CPAP, and polycythemia vera. He presented to the ED on 9/29 with complaints of dyspnea and weight gain. He reported that he doubled his Lasix dose leading to some weight loss, but his dyspnea persisted.   He is followed by cardiologist Dr. Stanford Breed and was initially diagnosed with CHF in August of 2013 when his ECHO showed an EF of 35-40% with mild to moderate dilation of the left atrium. Echocardiogram previous to this hospitalization was in 06/2020 showing LVEF of 45-55% with moderate dilation of the left ventricular internal cavity and grade I diastolic dysfunction. Chest CT in 12/2021 showed small bilateral pleural effusion. ECHO on 02/04/2022 showed LVEF of 40% with progression to severe dilation of his left ventricular internal cavity size and grade II diastolic dysfunction.   Today, William Villanueva presents to the [facility] for follow up. He endorses SOB, however, this has been his baseline for at least 20 years. He also has exclusive left LEE. He endorses frequent salt use prior to admission. He has not been adding salt to his diet since discharge, however, he does not monitor his salt intake. He endorses a fluid restricted diet. He has been taking his medications as prescribed, however, he has not taken his medications today. He reported normal BP reading of 130/80, however, he states this is prior to medications. He endorses daily weights.  HF Medications:  Furosemide 69m once daily, losartan 210monce daily, spironolactone 12.10m68mnce daily, carvedilol 3.1210m62mD  Has the patient been experiencing any side effects to the medications prescribed?  no  Does the patient have any problems obtaining medications due to transportation  or finances?   no  Understanding of regimen: good Understanding of indications: good Potential of compliance: good Patient understands to avoid NSAIDs. Patient understands to avoid decongestants.   Pertinent Lab Values: Serum creatinine 1.46, BUN 31, Potassium 4.1, Sodium 136, BNP 309.1 (10/10), Magnesium 2.1 (10/3)  Vital Signs: Weight: 246 lbs (discharge weight: 247 lbs) Blood pressure: 130/70  Heart rate: 80   Medication Assistance / Insurance Benefits Check: Does the patient have prescription insurance?  Yes Type of insurance plan: [employer] [facility]  Outpatient Pharmacy:  [facility]? yes  If [facility] was used, were the refills transferred out to current pharmacy yet? 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 NYHA class II-III symptoms, however, this appears to be his baseline. - Previously on Entresto but stopped in 2022 d/t hypotension. Continue current dose of Losartan.  -Not eligible for SLGT2i d/t current use of hydroxyurea and risk of infection. Could consider at a later time if hydroxyurea is discontinued. -Continue current dose of spironolactone and carvedilol as he has not taken his BP today.  -Diuretic therapy appropriate at this time  Plan: 1) Medication changes: - None at this time   2) Patient Assistance: -Copays: $47 for Jardiance and FarxWilder Gladeinitiated later on in his care.   3) Follow up: - Next appointment with cardiology on 10/17  AllyMaryan PulsarmD PGY-1 [facility] Resident

## 2022-02-17 ENCOUNTER — Encounter (HOSPITAL_COMMUNITY): Payer: Self-pay

## 2022-02-17 ENCOUNTER — Ambulatory Visit (HOSPITAL_COMMUNITY)
Admit: 2022-02-17 | Discharge: 2022-02-17 | Disposition: A | Discharge status: Home / Self Care | Payer: Medicare Other | Attending: Physician Assistant | Admitting: Physician Assistant

## 2022-02-17 VITALS — BP 130/70 | HR 88 | Wt 246.4 lb

## 2022-02-17 DIAGNOSIS — D45 Polycythemia vera: Secondary | ICD-10-CM | POA: Insufficient documentation

## 2022-02-17 DIAGNOSIS — I10 Essential (primary) hypertension: Secondary | ICD-10-CM | POA: Diagnosis not present

## 2022-02-17 DIAGNOSIS — G4733 Obstructive sleep apnea (adult) (pediatric): Secondary | ICD-10-CM | POA: Insufficient documentation

## 2022-02-17 DIAGNOSIS — E669 Obesity, unspecified: Secondary | ICD-10-CM | POA: Diagnosis not present

## 2022-02-17 DIAGNOSIS — I428 Other cardiomyopathies: Secondary | ICD-10-CM | POA: Diagnosis not present

## 2022-02-17 DIAGNOSIS — Z950 Presence of cardiac pacemaker: Secondary | ICD-10-CM | POA: Diagnosis not present

## 2022-02-17 DIAGNOSIS — I110 Hypertensive heart disease with heart failure: Secondary | ICD-10-CM | POA: Diagnosis not present

## 2022-02-17 DIAGNOSIS — I5022 Chronic systolic (congestive) heart failure: Secondary | ICD-10-CM | POA: Diagnosis not present

## 2022-02-17 DIAGNOSIS — R053 Chronic cough: Secondary | ICD-10-CM | POA: Diagnosis not present

## 2022-02-17 DIAGNOSIS — Z79899 Other long term (current) drug therapy: Secondary | ICD-10-CM | POA: Insufficient documentation

## 2022-02-17 DIAGNOSIS — I2510 Atherosclerotic heart disease of native coronary artery without angina pectoris: Secondary | ICD-10-CM | POA: Insufficient documentation

## 2022-02-17 NOTE — Progress Notes (Unsigned)
Cardiology Clinic Note   Patient Name: William Villanueva Date of Encounter: 02/21/2022  Primary Care Provider:  Antony Contras, MD Primary Cardiologist:  Kirk Ruths, MD  Patient Profile    William Villanueva 71 year old male presents the clinic today for follow-up evaluation of his nonischemic cardiomyopathy and essential hypertension.  Past Medical History    Past Medical History:  Diagnosis Date   AV block, 2nd degree 46   St. Jude Allure Quadra pulse generator [id number], model PM [id number]   Back pain    Cardiomyopathy (HCC)    Nonischemic 45%.    CHF (congestive heart failure) ([place])    Gout    Hemochromatosis    Hypertension    Hypospadias 12/31/1950   born with   Nephrolithiasis    Polycythemia vera(238.4)    Past Surgical History:  Procedure Laterality Date   ANKLE SURGERY     BI-VENTRICULAR PACEMAKER INSERTION N/A 04/29/2014   Procedure: BI-VENTRICULAR PACEMAKER INSERTION (CRT-P);  Surgeon: Deboraha Sprang, MD; Laterality: Left  St. Jude Allure Quadra pulse generator [id number], model [place] [id number]   CARDIAC SURGERY     COLONOSCOPY N/A 02/15/2020   Procedure: COLONOSCOPY;  Surgeon: Ronnette Juniper, MD;  Location: [facility];  Service: Gastroenterology;  Laterality: N/A;   ESOPHAGOGASTRODUODENOSCOPY (EGD) WITH PROPOFOL N/A 02/15/2020   Procedure: ESOPHAGOGASTRODUODENOSCOPY (EGD) WITH PROPOFOL;  Surgeon: Ronnette Juniper, MD;  Location: [facility];  Service: Gastroenterology;  Laterality: N/A;   PILONIDAL CYST EXCISION     POSTERIOR LAMINECTOMY / DECOMPRESSION LUMBAR SPINE     TONSILLECTOMY      Allergies  Allergies  Allergen Reactions   Bupropion Hives and Other (See Comments)   Cephalexin Diarrhea and Other (See Comments)    Caused C-diff, also    Fluoxetine Itching   Ibuprofen Itching   Prednisone Itching and Other (See Comments)    Abdominal pain, also    Temazepam Other (See Comments)    Dizziness    Trazodone And Nefazodone Other (See Comments)    Dizziness    Fluoxetine Hcl Itching   Prozac [Fluoxetine Hcl] Itching    History of Present Illness    William Villanueva is a PMH of HTN, nonischemic cardiomyopathy, dilated cardiomyopathy, Mobitz type II AV block, orthostatic hypotension, combined systolic and diastolic CHF, OSA, COPD, GERD, type 2 diabetes, PPM, palpitations, chronic pain, obesity, epistaxis, and depression.  His echocardiogram 8/13 showed an ejection fraction of 35-40%.  He underwent cardiac catheterization 9/13 which showed mild nonobstructive CAD.  No evidence of hemodynamic restriction was noted.  Cardiac MRI 9/13 showed an ejection fraction of 34% with diffuse hypokinesis.  There was no hyperenhancement of scar tissue or evidence of cardiac hemochromatosis.  He was admitted 12/15 with high degree AV block he subsequently had CRT-P placed.  He underwent nuclear stress testing 3/17 which showed an ejection fraction of 32%.  He was noted to have prior inferior infarct but no ischemia.  He was treated medically.  His echocardiogram 2/22 showed an ejection fraction of 45-50% with moderate LVH, G1 DD.  Chest CT 8/23 showed small bilateral pleural effusion, pulmonary artery enlargement, emphysema, and coronary atherosclerosis.  His CorVue readings were abnormal and he contacted the cardiology office and noted increased dyspnea.  He was seen in the clinic by Dr. Stanford Breed for evaluation.  He had gained 5 pounds in the previous week.  He noted progressive dyspnea on exertion and also at rest.  His furosemide was increased to 80 mg daily.  He tolerated increase furosemide well.  He lost 3 pounds over several days and noted improvement with his breathing.  He felt it was back to his baseline.  He denied exertional chest discomfort.  He denied lower extremity swelling and syncope.  He was seen in the hospital on 02/14/2022 for ongoing cough and left shoulder pain.  He denied fever and chills nausea and vomiting.  He was taking Robitussin which was helping  with his cough.  He was on 2 L supplemental oxygen.  His left shoulder pain has been present for 1 month.  He was losing weight and had decreased lower extremity swelling.  His BMP showed stable electrolytes and creatinine values.  His BNP was slightly elevated at 309.  His CBC was also stable.  His high-sensitivity troponins were mildly elevated at 22.  His chest x-ray showed subtle bibasilar opacities which was felt to be atelectasis or early pneumonia.  He was prescribed and oral course of antibiotics.  He followed up in the heart failure TOC clinic on 02/17/2022.  His breathing was at baseline.  He had lost around 14 pounds.  He is tolerating his medications well.  He was using salt restriction.  He presents to the clinic today for follow-up evaluation and states he feels well.  He has not yet eaten breakfast this morning.  His biggest complaint is with his left foot.  He has some mild redness from his mid foot through his toes.  He also has mild swelling.  He reports that it is improving.  It appears that he had poor fitting shoes which formed a blister.  He denies fever chills.  His weight remains stable.  We reviewed his current medication regimen.  He continues to cough.  He is not wearing oxygen today.  He was recently seen placed on antibiotics in the emergency department.  He reports fairly sedentary lifestyle.  He will get up to use the bathroom and return to sitting.  I will give him a salty 6 diet sheet, have him increase his physical activity as tolerated, have him use incentive spirometry several times per day, continue daily weights, and continue fluid restriction.  We will plan follow-up in 6 months.   Today he denies chest pain, increased shortness of breath, lower extremity edema, fatigue, palpitations, melena, hematuria, hemoptysis, diaphoresis, weakness, presyncope, syncope, orthopnea, and PND.      Home Medications    Prior to Admission medications   Medication Sig Start Date  End Date Taking? Authorizing Provider  acetaminophen (TYLENOL) 500 MG tablet Take 1,000 mg by mouth every 6 (six) hours as needed for mild pain.    [provider]  allopurinol (ZYLOPRIM) 300 MG tablet Take 300 mg by mouth daily. 06/30/19   [provider]  budesonide (PULMICORT) 0.5 MG/2ML nebulizer solution Inhale one vial in nebulizer twice a day. **Rinse mouth after use. Patient taking differently: Take 0.5 mg by nebulization 2 (two) times daily. **Rinse mouth after use.** 12/05/21   Margaretha Seeds, MD  carvedilol (COREG) 3.125 MG tablet Take 1 tablet (3.125 mg total) by mouth 2 (two) times daily with a meal. 08/19/21   Deboraha Sprang, MD  chlorzoxazone (PARAFON) 500 MG tablet Take 500 mg by mouth 3 (three) times daily. 10/17/21   [provider]  colestipol (COLESTID) 1 g tablet Take 1 g by mouth 2 (two) times daily. 04/24/20   [provider]  Cyanocobalamin (B-12 PO) Take 1 tablet by mouth daily.    [provider]  diclofenac Sodium (  VOLTAREN) 1 % GEL Apply 2 g topically 4 (four) times daily. Apply to left shoulder for pain. 02/14/22   Angelique Blonder, DO  dicyclomine (BENTYL) 20 MG tablet Take 20 mg by mouth 2 (two) times daily. 03/15/20   [provider]  doxycycline (VIBRAMYCIN) 50 MG capsule Take 2 capsules (100 mg total) by mouth 2 (two) times daily for 7 days. 02/14/22 02/21/22  Angelique Blonder, DO  folic acid (FOLVITE) 1 MG tablet Take 1 mg by mouth daily.    [provider]  furosemide (LASIX) 40 MG tablet Take 1 tablet (40 mg total) by mouth daily. Take 1 tablet twice a day in case of weight gain 2 to 3 lbs in 24 hrs or 5 lbs in 7 days, until weight back to baseline. 02/12/22   Arrien, Jimmy Picket, MD  guaiFENesin-dextromethorphan (ROBITUSSIN DM) 100-10 MG/5ML syrup Take 5 mLs by mouth every 6 (six) hours as needed for cough. 02/10/22   Arrien, Jimmy Picket, MD  hydroxyurea (HYDREA) 500 MG capsule Take 2 capsules (1,000  mg total) by mouth daily. May take with food to minimize GI side effects. Currently taking 500 mg daily but MD note says may need to increase on 12/27/2020 to twice daily. Patient taking differently: Take 1,000 mg by mouth daily. May take with food to minimize GI side effects. 12/17/20   Volanda Napoleon, MD  insulin degludec (TRESIBA FLEXTOUCH) 100 UNIT/ML SOPN FlexTouch Pen Inject 20 Units into the skin daily after breakfast.     [provider]  ipratropium-albuterol (DUONEB) 0.5-2.5 (3) MG/3ML SOLN Inhale one vial in nebulizer 3 times a day and every 6 hrs as needed. Patient taking differently: Take 3 mLs by nebulization 2 (two) times daily. 12/02/21   Margaretha Seeds, MD  losartan (COZAAR) 25 MG tablet Take 1 tablet (25 mg total) by mouth daily. 02/10/22 03/12/22  Arrien, Jimmy Picket, MD  Magnesium Oxide 400 MG CAPS Take 2 capsules (800 mg total) by mouth in the morning and at bedtime. 04/26/20   Deboraha Sprang, MD  omeprazole (PRILOSEC) 20 MG capsule Take 20 mg by mouth 2 (two) times daily before a meal.  07/05/16   [provider]  oxybutynin (DITROPAN XL) 15 MG 24 hr tablet Take 15 mg by mouth daily. 02/06/22   [provider]  rosuvastatin (CRESTOR) 5 MG tablet Take 5 mg by mouth daily.    [provider]  spironolactone (ALDACTONE) 25 MG tablet Take 0.5 tablets (12.5 mg total) by mouth daily. 02/12/22   Arrien, Jimmy Picket, MD  tamsulosin (FLOMAX) 0.4 MG CAPS capsule Take 0.4 mg by mouth 2 (two) times daily.  08/03/17   [provider]  Turmeric (QC TUMERIC COMPLEX) 500 MG CAPS Take 500 mg by mouth daily.    [provider]  zolpidem (AMBIEN) 5 MG tablet Take 1 tablet (5 mg total) by mouth at bedtime as needed for sleep. 02/10/22   Arrien, Jimmy Picket, MD    Family History    Family History  Problem Relation Age of Onset   Heart disease Maternal Grandmother        Pacemaker, MI   Stroke Mother    Other Father         Deceased, car fell on him   Diabetes Sister    Hypertension Sister    He indicated that his mother is alive. He indicated that his father is deceased. He indicated that his maternal grandmother is deceased.  Social History  Social History   Socioeconomic History   Marital status: Married    Spouse name: Mardene Celeste   Number of children: 0   Years of education: Not on file   Highest education level: High school graduate  Occupational History   Occupation: Retired  Tobacco Use   Smoking status: Former    Packs/day: 1.00    Years: 44.00    Total pack years: 44.00    Types: Cigarettes    Start date: 06/05/1968    Quit date: 04/05/2013    Years since quitting: 8.8   Smokeless tobacco: Never   Tobacco comments:    quit in 2014  Vaping Use   Vaping Use: Never used  Substance and Sexual Activity   Alcohol use: Yes    Alcohol/week: 0.0 standard drinks of alcohol    Comment: rare   Drug use: No   Sexual activity: Not Currently  Other Topics Concern   Not on file  Social History Narrative   Lives at home with wife in a one story home.  Has no children.  Does not work.  Getting workman's comp.  Education: 4 years trade school.    Social Determinants of Health   Financial Resource Strain: Low Risk  (02/06/2022)   Overall Financial Resource Strain (CARDIA)    Difficulty of Paying Living Expenses: Not very hard  Food Insecurity: No Food Insecurity (02/06/2022)   Hunger Vital Sign    Worried About Running Out of Food in the Last Year: Never true    Ran Out of Food in the Last Year: Never true  Transportation Needs: No Transportation Needs (02/06/2022)   PRAPARE - Hydrologist (Medical): No    Lack of Transportation (Non-Medical): No  Physical Activity: Not on file  Stress: Not on file  Social Connections: Not on file  Intimate Partner Violence: Not on file     Review of Systems    General:  No chills, fever, night sweats or weight changes.   Cardiovascular:  No chest pain, dyspnea on exertion, edema, orthopnea, palpitations, paroxysmal nocturnal dyspnea. Dermatological: No rash, lesions/masses Respiratory: No cough, dyspnea Urologic: No hematuria, dysuria Abdominal:   No nausea, vomiting, diarrhea, bright red blood per rectum, melena, or hematemesis Neurologic:  No visual changes, wkns, changes in mental status. All other systems reviewed and are otherwise negative except as noted above.  Physical Exam    VS:  BP (!) 110/58   Pulse 84   Ht 5' 10"  (1.778 m)   Wt 243 lb (110.2 kg)   SpO2 91%   BMI 34.87 kg/m  , BMI Body mass index is 34.87 kg/m. GEN: Well nourished, well developed, in no acute distress. HEENT: normal. Neck: Supple, no JVD, carotid bruits, or masses. Cardiac: RRR, no murmurs, rubs, or gallops. No clubbing, cyanosis, slight left foot edema   Radials/DP/PT 2+ and equal bilaterally.  Respiratory:  Respirations regular and unlabored, clear to auscultation bilaterally. GI: Soft, nontender, nondistended, BS + x 4. MS: no deformity or atrophy. Skin: warm and dry, no rash.  Mild erythema left foot Neuro:  Strength and sensation are intact. Psych: Normal affect.  Accessory Clinical Findings    Recent Labs: 12/12/2021: Pro B Natriuretic peptide (BNP) 414.0; TSH 0.26 02/07/2022: Magnesium 2.1 02/14/2022: B Natriuretic Peptide 309.1 02/16/2022: ALT 6; BUN 31; Creatinine 1.46; Hemoglobin 10.1; Platelet Count 704; Potassium 4.1; Sodium 136   Recent Lipid Panel    Component Value Date/Time   CHOL 70 02/06/2022 0100  TRIG 83 02/06/2022 0100   HDL 26 (L) 02/06/2022 0100   CHOLHDL 2.7 02/06/2022 0100   VLDL 17 02/06/2022 0100   LDLCALC 27 02/06/2022 0100         ECG personally reviewed by me today-none today.  Echocardiogram 02/04/2022  IMPRESSIONS     1. Left ventricular ejection fraction, by estimation, is 40%. The left  ventricle has moderately decreased function. The left ventricle  demonstrates  global hypokinesis. The left ventricular internal cavity size  was severely dilated. Left ventricular  diastolic parameters are consistent with Grade II diastolic dysfunction  (pseudonormalization). Elevated left atrial pressure. The E/e' is 19.8.   2. Right ventricular systolic function is normal. The right ventricular  size is mildly enlarged. There is mildly elevated pulmonary artery  systolic pressure.   3. Left atrial size was severely dilated.   4. Right atrial size was mildly dilated.   5. The mitral valve is grossly normal. Mild mitral valve regurgitation.   6. The aortic valve is tricuspid. There is mild thickening of the aortic  valve. Aortic valve regurgitation is not visualized. Aortic valve  sclerosis is present, with no evidence of aortic valve stenosis.   7. The inferior vena cava is dilated in size with >50% respiratory  variability, suggesting right atrial pressure of 8 mmHg.   Comparison(s): Compared to prior TTE on 06/2020, the LV is now severely  dilated with EF ~40% (previously moderately dilated with EF 45-50%).  Assessment & Plan   1.  Acute on chronic combined systolic and diastolic CHF-no increased DOE or activity intolerance.  Weight stable.  Euvolemic today.  Reports compliance with his medication and denies side effects.  Not a candidate for SGLT2 inhibitor due to Hydrea and pannus (risk of infection). Continue furosemide Heart healthy low-sodium diet-salty 6 given Daily weights-contact office with a weight increase of 2 to 3 pounds overnight or 5 pounds in 1 week Increase physical activity as tolerated Elevate lower extremities when not active  Nonischemic cardiomyopathy-maintain a baseline physical activity.  Previously did not tolerate Entresto. Continue current medical therapy Heart healthy low-sodium diet  Essential hypertension-BP today 110/58.  Well-controlled at home. Continue current medical therapy  CRT-P-denies episodes of lightheadedness  presyncope and syncope.  Device interrogated 01/27/2022.  Abnormal, battery approaching elective replacement indicator. Follows with Dr. Caryl Comes  Coronary artery disease-no chest pain today.  Coronary calcification previously noted on CT scan. Continue rosuvastatin Heart healthy low-sodium diet  Hyperlipidemia- 02/06/2022: Cholesterol 70; HDL 26; LDL Cholesterol 27; Triglycerides 83; VLDL 17 Continue rosuvastatin Heart healthy low-sodium high-fiber diet Maintain physical activity  Left foot edema-improving. Continue to monitor-instructed on diabetic foot care.  Disposition: Follow-up with Dr. Stanford Breed or me in 6 months.   Jossie Ng. Sarye Kath NP-C     02/21/2022, 10:32 AM [place] [address] Office [phone] Fax [phone]  Notice: This dictation was prepared with Dragon dictation along with smaller phrase technology. Any transcriptional errors that result from this process are unintentional and may not be corrected upon review.  I spent 14 minutes examining this patient, reviewing medications, and using patient centered shared decision making involving her cardiac care.  Prior to her visit I spent greater than 20 minutes reviewing her past medical history,  medications, and prior cardiac tests.

## 2022-02-17 NOTE — Addendum Note (Signed)
Encounter addended by: Joette Catching, PA-C on: 02/17/2022 3:36 PM  Actions taken: Clinical Note Signed

## 2022-02-17 NOTE — Progress Notes (Addendum)
HEART & VASCULAR TRANSITION OF CARE CONSULT NOTE     Referring Physician: Dr. Cathlean Sauer Primary Care: Dr. Moreen Fowler Primary Cardiologist: Dr. Stanford Breed  HPI: Referred to clinic by Dr. Moreen Fowler for heart failure consultation. 71 y.o. male with hx of longstanding chronic systolic CHF/NICM, high grade AV block s/p CRT-P, hemochromatosis, polycythemia vera on hydrea, HTN, obesity, OSA unable to tolerate CPAP, COPD, prior tobacco use.   CHF diagnosed in 2013. EF 35-40% at that time. Cardiac cath with nonobstructive CAD. cMRI 09/13: EF 34%, LV moderate dilated, no LGE, no evidence of cardiac hemachromatosis.  Echo 05/2018: EF 45-50% Echo 01/2020: EF 30-35%, RV okay Echo 02/22: EF 45-50%  Seen by his Cardiologist, Dr. Stanford Breed, 09/19 for urgent visit d/t elevated CorVue readings on device and worsening dyspnea. Lasix increased to 80 mg daily with some improvement in symptoms. He was kept on higher dose of diuretic. SGLT2i not added to concern for infection while on Hydrea.   Admitted 02/03/22 with acute respiratory failure with hypoxia 2/2 acute on chronic CHF. He was diuresed with IV lasix. CTA chest negative for PE. Echo during admit: EF 40%, severely dilated LV, grade II DD, RV okay, severe LAE, mild MR. Arlyce Harman and Losartan briefly held for mild AKI but added back prior to discharge.  Low dose Coreg continued. He was discharged home on supplemental O2 due to desaturations with activity.  Seen in ED 02/14/22 with productive cough and shortness of breath. His grandson and wife had recently been sick. Started on course of abx for possible PNA. COVID and flu negative.  He saw his hematologist yesterday and was given dose of IV Rocephin d/t persistent cough.  Seen today for HF f/u. Cough seems to be improving with abx. Has some shortness of breath with exertion, feels that this is back to his baseline. No orthopnea or PND. Has some lower extremity edema, L > R, but has continued to improve since  discharge (no DVT on [place] during recent admit). He lost 14 lbs in hospital and another 4 lb since discharge home. Home weight 242 lb today. Has lost > 70 lb over the last couple of years with cutting back portions.   Taking all medications as prescribed. Admits he had not been watching sodium intake and was using a salt shaker prior to recent admission. Has cut back salt significantly.     Review of Systems: [y] = yes, [ ]  = no   General: Weight gain [ ] ; Weight loss [Y]; Anorexia [ ] ; Fatigue [ ] ; Fever [ ] ; Chills [ ] ; Weakness [ ]   Cardiac: Chest pain/pressure [ ] ; Resting SOB [ ] ; Exertional SOB [Y]; Orthopnea [ ] ; Pedal Edema [ ] ; Palpitations [ ] ; Syncope [ ] ; Presyncope [ ] ; Paroxysmal nocturnal dyspnea[ ]   Pulmonary: Cough [Y ]; Wheezing[ ] ; Hemoptysis[ ] ; Sputum [ ] ; Snoring [ ]   GI: Vomiting[ ] ; Dysphagia[ ] ; Melena[ ] ; Hematochezia [ ] ; Heartburn[ ] ; Abdominal pain [ ] ; Constipation [ ] ; Diarrhea [ ] ; BRBPR [ ]   GU: Hematuria[ ] ; Dysuria [ ] ; Nocturia[ ]   Vascular: Pain in legs with walking [ ] ; Pain in feet with lying flat [ ] ; Non-healing sores [ ] ; Stroke [ ] ; TIA [ ] ; Slurred speech [ ] ;  Neuro: Headaches[ ] ; Vertigo[ ] ; Seizures[ ] ; Paresthesias[ ] ;Blurred vision [ ] ; Diplopia [ ] ; Vision changes [ ]   Ortho/Skin: Arthritis [ ] ; Joint pain [ ] ; Muscle pain [ ] ; Joint swelling [ ] ; Back Pain [ ] ; Rash [ ]   Psych: Depression[ ] ; Anxiety[ ]   Heme: Bleeding problems [ ] ; Clotting disorders [ ] ; Anemia [Y]  Endocrine: Diabetes [ ] ; Thyroid dysfunction[ ]    Past Medical History:  Diagnosis Date   AV block, 2nd degree 2015   [employer] Allure Quadra pulse generator [id number], model PM 3242   Back pain    Cardiomyopathy (HCC)    Nonischemic 45%.    CHF (congestive heart failure) ([place])    Gout    Hemochromatosis    Hypertension    Hypospadias 07-24-1950   born with   Nephrolithiasis    Polycythemia vera(238.4)     Current Outpatient Medications  Medication Sig Dispense Refill    acetaminophen (TYLENOL) 500 MG tablet Take 1,000 mg by mouth every 6 (six) hours as needed for mild pain.     allopurinol (ZYLOPRIM) 300 MG tablet Take 300 mg by mouth daily.     budesonide (PULMICORT) 0.5 MG/2ML nebulizer solution Inhale one vial in nebulizer twice a day. **Rinse mouth after use. (Patient taking differently: Take 0.5 mg by nebulization 2 (two) times daily. **Rinse mouth after use.**) 60 mL 10   carvedilol (COREG) 3.125 MG tablet Take 1 tablet (3.125 mg total) by mouth 2 (two) times daily with a meal. 180 tablet 3   chlorzoxazone (PARAFON) 500 MG tablet Take 500 mg by mouth 3 (three) times daily.     colestipol (COLESTID) 1 g tablet Take 1 g by mouth 2 (two) times daily.     Cyanocobalamin (B-12 PO) Take 1 tablet by mouth daily.     diclofenac Sodium (VOLTAREN) 1 % GEL Apply 2 g topically 4 (four) times daily. Apply to left shoulder for pain. 200 g 0   dicyclomine (BENTYL) 20 MG tablet Take 20 mg by mouth 2 (two) times daily.     doxycycline (VIBRAMYCIN) 50 MG capsule Take 2 capsules (100 mg total) by mouth 2 (two) times daily for 7 days. 28 capsule 0   folic acid (FOLVITE) 1 MG tablet Take 1 mg by mouth daily.     furosemide (LASIX) 40 MG tablet Take 1 tablet (40 mg total) by mouth daily. Take 1 tablet twice a day in case of weight gain 2 to 3 lbs in 24 hrs or 5 lbs in 7 days, until weight back to baseline. 30 tablet 0   guaiFENesin-dextromethorphan (ROBITUSSIN DM) 100-10 MG/5ML syrup Take 5 mLs by mouth every 6 (six) hours as needed for cough. 237 mL 0   hydroxyurea (HYDREA) 500 MG capsule Take 2 capsules (1,000 mg total) by mouth daily. May take with food to minimize GI side effects. Currently taking 500 mg daily but MD note says may need to increase on 12/27/2020 to twice daily. (Patient taking differently: Take 1,000 mg by mouth daily. May take with food to minimize GI side effects.) 180 capsule 5   insulin degludec (TRESIBA FLEXTOUCH) 100 UNIT/ML SOPN FlexTouch Pen Inject 20  Units into the skin daily after breakfast.      ipratropium-albuterol (DUONEB) 0.5-2.5 (3) MG/3ML SOLN Inhale one vial in nebulizer 3 times a day and every 6 hrs as needed. (Patient taking differently: Take 3 mLs by nebulization 2 (two) times daily.) 120 mL 11   losartan (COZAAR) 25 MG tablet Take 1 tablet (25 mg total) by mouth daily. 30 tablet 0   Magnesium Oxide 400 MG CAPS Take 2 capsules (800 mg total) by mouth in the morning and at bedtime. 90 capsule 1   omeprazole (PRILOSEC) 20 MG capsule Take  20 mg by mouth 2 (two) times daily before a meal.      oxybutynin (DITROPAN XL) 15 MG 24 hr tablet Take 15 mg by mouth daily.     rosuvastatin (CRESTOR) 5 MG tablet Take 5 mg by mouth daily.     spironolactone (ALDACTONE) 25 MG tablet Take 0.5 tablets (12.5 mg total) by mouth daily.     tamsulosin (FLOMAX) 0.4 MG CAPS capsule Take 0.4 mg by mouth 2 (two) times daily.      Turmeric (QC TUMERIC COMPLEX) 500 MG CAPS Take 500 mg by mouth daily.     zolpidem (AMBIEN) 5 MG tablet Take 1 tablet (5 mg total) by mouth at bedtime as needed for sleep. 15 tablet 0   No current facility-administered medications for this encounter.   Facility-Administered Medications Ordered in Other Encounters  Medication Dose Route Frequency Provider Last Rate Last Admin   0.9 %  sodium chloride infusion   Intravenous Continuous Volanda Napoleon, MD   Stopped at 05/16/13 1115    Allergies  Allergen Reactions   Bupropion Hives and Other (See Comments)   Cephalexin Diarrhea and Other (See Comments)    Caused C-diff, also    Fluoxetine Itching   Ibuprofen Itching   Prednisone Itching and Other (See Comments)    Abdominal pain, also    Temazepam Other (See Comments)    Dizziness    Trazodone And Nefazodone Other (See Comments)    Dizziness   Fluoxetine Hcl Itching   Prozac [Fluoxetine Hcl] Itching      Social History   Socioeconomic History   Marital status: Married    Spouse name: Mardene Celeste   Number of  children: 0   Years of education: Not on file   Highest education level: High school graduate  Occupational History   Occupation: Retired  Tobacco Use   Smoking status: Former    Packs/day: 1.00    Years: 44.00    Total pack years: 44.00    Types: Cigarettes    Start date: 06/05/1968    Quit date: 04/05/2013    Years since quitting: 8.8   Smokeless tobacco: Never   Tobacco comments:    quit in 2014  Vaping Use   Vaping Use: Never used  Substance and Sexual Activity   Alcohol use: Yes    Alcohol/week: 0.0 standard drinks of alcohol    Comment: rare   Drug use: No   Sexual activity: Not Currently  Other Topics Concern   Not on file  Social History Narrative   Lives at home with wife in a one story home.  Has no children.  Does not work.  Getting workman's comp.  Education: 4 years trade school.    Social Determinants of Health   Financial Resource Strain: Low Risk  (02/06/2022)   Overall Financial Resource Strain (CARDIA)    Difficulty of Paying Living Expenses: Not very hard  Food Insecurity: No Food Insecurity (02/06/2022)   Hunger Vital Sign    Worried About Running Out of Food in the Last Year: Never true    Ran Out of Food in the Last Year: Never true  Transportation Needs: No Transportation Needs (02/06/2022)   PRAPARE - Hydrologist (Medical): No    Lack of Transportation (Non-Medical): No  Physical Activity: Not on file  Stress: Not on file  Social Connections: Not on file  Intimate Partner Violence: Not on file      Family History  Problem  Relation Age of Onset   Heart disease Maternal Grandmother        Pacemaker, MI   Stroke Mother    Other Father        Deceased, car fell on him   Diabetes Sister    Hypertension Sister     There were no vitals filed for this visit.  PHYSICAL EXAM: General:  Well appearing. Ambulated into clinic HEENT: normal Neck: supple. JVP not elevated Carotids 2+ bilat; no bruits.  Cor: PMI  nondisplaced. Regular rate & rhythm. No rubs, gallops or murmurs. Lungs: clear Abdomen: soft, nontender, nondistended.  Extremities: no cyanosis, clubbing, rash, 1 + LLE, trace on right Neuro: alert & oriented x 3, cranial nerves grossly intact. moves all 4 extremities w/o difficulty. Affect pleasant.  ECG: V paced 84 bpm, PVC   ASSESSMENT & PLAN:  Chronic HFmEF/NICM -EF 35-40% 2013  -Cardiac cath 2013 with nonobstructive CAD -cMRI 09/13: EF 34%, LV moderate dilated, no LGE, no evidence of cardiac hemachromatosis. Ferritin has not been significantly elevated -Echo 05/2018: EF 45-50% -Echo 01/2020: EF 30-35%, RV okay -Echo 02/22: EF 45-50% -Echo 10/23: EF 40%, severely dilated LV, grade II DD, RV okay, severe LAE, mild MR -Suspect sodium intake may have contributed to recent decompensation. Has cut back on salt -NYHA early III. Volume looks good on exam today. Continue 40 mg Furosemide daily. Labs stable at f/u with Oncology yesterday. May need to cut back furosemide if continues to lose weight. -Continue spiro 12.5 daily -Continue coreg 3.125 mg BID -Continue losartan 25 daily -BP stable in clinic but has not taken any meds today. BP around 130 before meds at home. Recommended he check BP after meds. Would consider increasing spiro or losartan at f/u visit if BP allows.  -Unable to tolerate Entresto in the past d/t hypotension -Avoiding SGLT2i d/t use of hydrea and risk of infection  High grade AV block s/p CRT-P -95% BiV paced -Followed by Dr. Caryl Comes  HTN BP stable Meds as above  Polycythemia vera -on Hydrea -Followed by Hematology   NYHA III GDMT  Diuretic-40 mg furosemide daily BB-Coreg 3.125 mg BID Ace/ARB/ARNI-Losartan 25 daily MRA-Spiro 12.5 daily SGLT2i-No, risk of infection    Referred to HFSW (PCP, Medications, Transportation, ETOH Abuse, Drug Abuse, Insurance, Financial ): No Refer to Pharmacy: No Refer to Home Health: No Refer to Advanced Heart Failure  Clinic: No Refer to General Cardiology: No, already followed by Dr. Stanford Breed  Follow up: [facility] clinic as needed, sees Cardiology 02/21/22 for hospital f/u

## 2022-02-17 NOTE — Patient Instructions (Addendum)
EKG done today.  No Labs done today.   No other medication changes were made.   Your physician recommends that you keep your scheduled follow-up appointment with William Memos, NP on Tuesday October 17th 2023 at 10:05am.   Do the following things EVERYDAY: Weigh yourself in the morning before breakfast. Write it down and keep it in a log. Take your medicines as prescribed Eat low salt foods--Limit salt (sodium) to 2000 mg per day.  Stay as active as you can everyday Limit all fluids for the day to less than 2 liters

## 2022-02-19 LAB — CULTURE, BLOOD (ROUTINE X 2)
Culture: NO GROWTH
Special Requests: ADEQUATE

## 2022-02-20 ENCOUNTER — Ambulatory Visit (INDEPENDENT_AMBULATORY_CARE_PROVIDER_SITE_OTHER): Payer: Medicare Other

## 2022-02-20 DIAGNOSIS — I5042 Chronic combined systolic (congestive) and diastolic (congestive) heart failure: Secondary | ICD-10-CM

## 2022-02-20 DIAGNOSIS — Z950 Presence of cardiac pacemaker: Secondary | ICD-10-CM | POA: Diagnosis not present

## 2022-02-20 NOTE — Progress Notes (Signed)
EPIC Encounter for ICM Monitoring  Patient Name: William Villanueva is a 71 y.o. male Date: 02/20/2022 Primary Care Physican: Antony Contras, MD Primary Cardiologist: Stanford Breed Electrophysiologist: Vergie Living Pacing: 95%            06/15/2021 Weight: 255 lbs 07/27/2021 Weight: 250-255 lbs 08/23/2021 Weight: 255 lbs 09/27/2021 Weight: 261 lbs 01/11/2022 Weight: 255 lbs 01/23/2022 Weight: 260 lbs 01/31/2022 Weight: 256 lbs 02/19/2022 Weight: 240 lbs 02/20/2022 Weight: 242 lbs    Battery Longevity: <3 months   AT/AF Burden <1%                                                            Spoke with patient and wife.  Pt reports weight increased 2 lbs overnight and he took 2 Lasix tablets this morning.  He does not add salt to his foods but unaware grocery store foods are high in salt.  Encouraged to review labels to check salt content.  Wife reports patient does not eat vegetables.  Pt did have one visit with HF clinic but said was not instructed to return.     Hospitalized 9/29 & 10/9 for fluid and was diuresed 20 lbs.               CorVue thoracic impedance suggesting dryness which correlates being diuresed during hospitalization but possible fluid accumulation has returned starting 10/13.   Prescribed:   Furosemide 40 mg Take 1 tablet (40 mg total) by mouth daily. Take 1 tablet twice a day in case of weight gain 2 to 3 lbs in 24 hrs or 5 lbs in 7 days, until weight back to baseline. Spironolactone 25 mg take 0.5 tablet (12.5 mg total) once a day   Labs: 09/16/2021 Creatinine 1.10, BUN 17, Potassium 4.3, Sodium 139, GFR >60 08/12/2021 Creatinine 1.30, BUN 18, Potassium 4.5, Sodium 138, GFR 59 07/08/2021 Creatinine 1.58, BUN 28, Potassium 4.6, Sodium 137, GFR 47 06/10/2021 Creatinine 1.22, BUN 20, Potassium 4.5, Sodium 139, GFR >60 A complete set of results can be found in Results Review.   Recommendations:  Encouraged to read food labels for salt amount and limit salt to 2000 mg daily and fluid  intake to 64 oz daily.       Follow-up plan: ICM clinic phone appointment on 02/27/2022 to recheck fluid levels.   91 day device clinic remote transmission 02/23/2022.     EP/Cardiology Office Visits:  02/21/2022 with Coletta Memos, NP for hospital f/u.   03/09/2022 with Dr Stanford Breed.  Recall 08/14/2022 with Oda Kilts, PA   Copy of ICM check sent to Dr. Caryl Comes and Coletta Memos, NP as Juluis Rainier for 10/17 OV.   3 month ICM trend: 02/20/2022.    12-14 Month ICM trend:     Rosalene Billings, RN 02/20/2022 3:35 PM

## 2022-02-21 ENCOUNTER — Encounter: Payer: Self-pay | Admitting: General Practice

## 2022-02-21 ENCOUNTER — Ambulatory Visit: Payer: Medicare Other | Attending: General Practice | Admitting: General Practice

## 2022-02-21 VITALS — BP 110/58 | HR 84 | Ht 70.0 in | Wt 243.0 lb

## 2022-02-21 DIAGNOSIS — I428 Other cardiomyopathies: Secondary | ICD-10-CM

## 2022-02-21 DIAGNOSIS — Z950 Presence of cardiac pacemaker: Secondary | ICD-10-CM | POA: Diagnosis not present

## 2022-02-21 DIAGNOSIS — E785 Hyperlipidemia, unspecified: Secondary | ICD-10-CM

## 2022-02-21 DIAGNOSIS — I2510 Atherosclerotic heart disease of native coronary artery without angina pectoris: Secondary | ICD-10-CM

## 2022-02-21 DIAGNOSIS — I10 Essential (primary) hypertension: Secondary | ICD-10-CM

## 2022-02-21 DIAGNOSIS — I5042 Chronic combined systolic (congestive) and diastolic (congestive) heart failure: Secondary | ICD-10-CM | POA: Diagnosis not present

## 2022-02-21 NOTE — Patient Instructions (Signed)
Medication Instructions:  The current medical regimen is effective;  continue present plan and medications as directed. Please refer to the Current Medication list given to you today.  *If you need a refill on your cardiac medications before your next appointment, please call your pharmacy*  Lab Work: NONE  Testing/Procedures: NONE  Other Instructions PLEASE READ AND FOLLOW ATTACHED  SALTY 6  PLEASE INCREASE PHYSICAL ACTIVITY-AS TOLERATED  CONTINUE USING YOUR INCENTIVE SPIROMETER  CONTINUE FLUID RESTRICTION  Follow-Up: At [facility], you and your health needs are our priority.  As part of our continuing mission to provide you with exceptional heart care, we have created designated Provider Care Teams.  These Care Teams include your primary Cardiologist (physician) and Advanced Practice Providers (APPs -  Physician Assistants and Nurse Practitioners) who all work together to provide you with the care you need, when you need it.  Your next appointment:   6 month(s)-PLEASE CALL 2 MONTHS IN ADVANCE TO SCHEDULE THIS APPOINTMENT  The format for your next appointment:   In Person  Provider:   Kirk Ruths, MD     Important Information About Sugar

## 2022-02-22 NOTE — Progress Notes (Signed)
Office Visit Note  Patient: William Villanueva             Date of Birth: 11/02/1950           MRN: [id number]             PCP: William Contras, MD Referring: William Contras, MD Visit Date: 02/23/2022   Subjective:  New Patient (Initial Visit) (Rheumatoid arthritis. Bilateral hand pain. Left shoulder limited ROM and pain recently. Back pain. )   History of Present Illness: William Villanueva is a 71 y.o. male here for rheumatoid arthritis. He was previously seeing [facility] rheumatology on treatment with methotrexate and had been on allopurinol and uloric for gout. He has history of carpal tunnel syndrome with surgical treatment for the left wrist.  Original diagnosis was RA he had joint pain in multiple areas with abnormal laboratory test.  He was started on injectable methotrexate but switched to oral methotrexate due to pain and difficulty with the injections.  He stopped following up with her clinic due to dissatisfaction including never seeing his MD and  not addressing any complaints not directly attributable to his inflammatory arthritis.  As result he has been off any disease specific medication for over a year does feel he has increased in joint pains off of treatment.  He had also been off of the Uloric they were prescribing but was restarted on allopurinol in the past few months with his PCP office.  He reports 1 flare of gout in his foot within the past month but before that had been free of any attacks for several months duration.  He still has residual swelling and erythema in the left foot but was told this may be coming from his heart failure edema as well.  Generally has daily joint pain in multiple areas particular including bilateral hands and in his back.  He gets foot and ankle pain most severe during gout flares has mild symptoms otherwise.  More recently also developed increased left shoulder pain for several days duration for which he went to urgent care for evaluation of this but  ended up going to the hospital due to hypoxia and increased cough so the original problem was never addressed.  Treatment course at the hospital for congestive heart failure exacerbation and improved after several days.  He still had increased cough producing significant discolored sputum and received 1 dose of IV ceftriaxone.  X-ray of the shoulder was obtained demonstrated mild glenohumeral joint osteoarthritis and moderate AC joint osteoarthritis.  Labs reviewed 10/2021 Uric acid 9.7 eGFR 59  Activities of Daily Living:  Patient reports morning stiffness for 5 minutes.   Patient Reports nocturnal pain.  Difficulty dressing/grooming: Denies Difficulty climbing stairs: Reports Difficulty getting out of chair: Reports Difficulty using hands for taps, buttons, cutlery, and/or writing: Reports  Review of Systems  Constitutional:  Positive for fatigue.  HENT:  Positive for mouth dryness. Negative for mouth sores.   Eyes:  Positive for dryness.  Respiratory:  Positive for shortness of breath.   Cardiovascular:  Positive for palpitations. Negative for chest pain.  Gastrointestinal:  Negative for blood in stool, constipation and diarrhea.  Endocrine: Positive for increased urination.  Genitourinary:  Negative for involuntary urination.  Musculoskeletal:  Positive for joint pain, gait problem, joint pain, joint swelling and morning stiffness. Negative for myalgias, muscle weakness, muscle tenderness and myalgias.  Skin:  Positive for hair loss. Negative for color change, rash and sensitivity to sunlight.  Allergic/Immunologic: Negative for susceptible to  infections.  Neurological:  Negative for dizziness and headaches.  Hematological:  Negative for swollen glands.  Psychiatric/Behavioral:  Positive for sleep disturbance. Negative for depressed mood. The patient is not nervous/anxious.     PMFS History:  Patient Active Problem List   Diagnosis Date Noted   Rheumatoid arthritis ([place])  02/23/2022   High risk medication use 02/23/2022   COPD (chronic obstructive pulmonary disease) ([place]) 02/10/2022   Acute kidney injury superimposed on chronic kidney disease ([place]) 02/08/2022   Class 2 obesity 02/08/2022   Acute on chronic combined systolic (congestive) and diastolic (congestive) heart failure ([place]) 02/04/2022   DM2 (diabetes mellitus, type 2) ([place]) 02/04/2022   Acute on chronic combined systolic and diastolic CHF (congestive heart failure) ([place]) 02/04/2022   Nocturnal hypoxemia 12/30/2021   Epistaxis 12/12/2021   Non-ischemic cardiomyopathy ([place]) 07/29/2020   Hemorrhagic shock ([place]) 02/18/2020   Acute blood loss anemia 02/18/2020   Postural dizziness with presyncope 02/18/2020   Ulcerative colitis, acute, unspecified complication ([place]) 07/37/1062   Diarrhea 02/12/2020   Generalized abdominal pain 02/12/2020   Thrombocytosis 02/12/2020   Sprain of left wrist 08/12/2019   CHF (congestive heart failure) ([place]) 07/17/2017   COPD with acute exacerbation ([place]) 06/14/2017   Hyponatremia 05/27/2016   Hypokalemia 05/27/2016   Depression 04/05/2016   Kidney stones 04/05/2016   Osteoarthritis 04/05/2016   OSA (obstructive sleep apnea) 04/05/2016   Bilateral carpal tunnel syndrome 12/02/2015   Chronic combined systolic and diastolic congestive heart failure ([place]) 04/13/2015   Orthostatic hypotension 01/19/2015   Obesity 11/26/2014   Enlarged prostate without lower urinary tract symptoms (luts) 10/29/2014   Essential hypertension 10/29/2014   GERD (gastroesophageal reflux disease) 10/29/2014   Panic attack 10/29/2014   Changing skin lesion 10/16/2014   Renal cyst 09/02/2014   Pacemaker lead malfunction-elevated threshold RV lead 07/31/2014   Dyspnea on exertion 06/21/2014   Gastroenteritis, acute 06/15/2014   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   Palpitations 06/11/2014   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   Other cardiomyopathies ([place]) 04/29/2014   Bradycardia  04/28/2014   Chronic pain 02/12/2014   Thrombocythemia 12/01/2013   Muscular wasting and disuse atrophy 08/15/2013   Lumbar and sacral osteoarthritis 05/19/2013   Congestive dilated cardiomyopathy ([place]) 01/11/2012   Compulsive tobacco user syndrome 12/13/2011   Current tobacco use 12/13/2011   Chest tightness    Rash    Back pain    Hemochromatosis    SOB (shortness of breath)    Gout    Polycythemia vera ([place]) 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 Jude Allure Quadra pulse generator [id number], model PM 3242   Back pain    Cardiomyopathy (HCC)    Nonischemic 45%.    CHF (congestive heart failure) ([place])    Gout    Hemochromatosis    Hypertension    Hypospadias August 01, 1950   born with   Nephrolithiasis    Polycythemia vera(238.4)     Family History  Problem Relation Age of Onset   Stroke Mother    Other Father        Deceased, car fell on him   Diabetes Sister    Hypertension Sister    Heart disease Maternal Grandmother        Pacemaker, [place]   Past Surgical History:  Procedure Laterality Date   ANKLE SURGERY     BI-VENTRICULAR PACEMAKER INSERTION N/A 04/29/2014   Procedure: BI-VENTRICULAR PACEMAKER INSERTION (CRT-P);  Surgeon: Revonda Standard  Caryl Comes, MD; Laterality: Left  St. Jude Allure Quadra pulse generator [id number], model [place] 3242   CARDIAC SURGERY     COLONOSCOPY N/A 02/15/2020   Procedure: COLONOSCOPY;  Surgeon: Ronnette Juniper, MD;  Location: [place] [facility];  Service: Gastroenterology;  Laterality: N/A;   ESOPHAGOGASTRODUODENOSCOPY (EGD) WITH PROPOFOL N/A 02/15/2020   Procedure: ESOPHAGOGASTRODUODENOSCOPY (EGD) WITH PROPOFOL;  Surgeon: Ronnette Juniper, MD;  Location: [facility];  Service: Gastroenterology;  Laterality: N/A;   PILONIDAL CYST EXCISION     POSTERIOR LAMINECTOMY / DECOMPRESSION LUMBAR SPINE     TONSILLECTOMY     Social History   Social History Narrative   Lives at home with wife in a one story home.  Has no  children.  Does not work.  Getting workman's comp.  Education: 4 years trade school.    Immunization History  Administered Date(s) Administered   COVID-19, mRNA, vaccine(Comirnaty)12 years and older 03/02/2022   DTaP 01/26/2016   Fluad Quad(high Dose 65+) 03/07/2021, 03/02/2022   Influenza Split 01/21/2015   Influenza, High Dose Seasonal PF 02/23/2017, 03/22/2018, 01/10/2019, 03/01/2020   Influenza,inj,Quad PF,6+ Mos 01/21/2015, 01/27/2016, 02/05/2017   Influenza-Unspecified 01/26/2016   PFIZER Comirnaty(Gray Top)Covid-19 Tri-Sucrose Vaccine 09/10/2020   PFIZER(Purple Top)SARS-COV-2 Vaccination 06/21/2019, 07/13/2019, 04/16/2020, 09/10/2020   Pfizer Covid-19 Vaccine Bivalent Booster 77yr & up 03/07/2021   Pneumococcal Conjugate-13 01/27/2016, 01/09/2018   Pneumococcal Polysaccharide-23 01/21/2015   Pneumococcal-Unspecified 01/26/2016   Tdap 01/26/2011, 01/27/2016, 08/09/2019   Zoster, Live 08/29/2019     Objective: Vital Signs: BP 102/65 (BP Location: Right Arm, Patient Position: Sitting, Cuff Size: Normal)   Pulse 73   Resp 16   Ht _0  (1.778 m)   Wt 242 lb 12.8 oz (110.1 kg)   BMI 34.84 kg/m    Physical Exam Constitutional:      Appearance: He is obese.  HENT:     Mouth/Throat:     Mouth: Mucous membranes are moist.     Pharynx: Oropharynx is clear.  Eyes:     Conjunctiva/sclera: Conjunctivae normal.  Cardiovascular:     Rate and Rhythm: Normal rate and regular rhythm.  Lymphadenopathy:     Cervical: No cervical adenopathy.  Skin:    General: Skin is warm and dry.     Comments: Left foot erythema over the medial side with some petechiae more prominent distally, trace swelling without significant pitting edema  Neurological:     Mental Status: He is alert.  Psychiatric:        Mood and Affect: Mood normal.      Musculoskeletal Exam:  Right shoulder normal, left shoulder pain with overhead abduction and with cross arm raise, tenderness worse around anterior  and lateral sides of joint, minimal AC joint tenderness Elbows full ROM no tenderness or swelling Wrists mildly limited ROM no palpable effusion possible chronic joint thickening Fingers full ROM no tenderness or swelling Knees full ROM no tenderness or swelling, left knee large bony nodule at patellar tendon origin Ankles left restricted inversion and eversion with tenderness MTPs full ROM no tenderness or swelling   CDAI Exam: CDAI Score: 5  Patient Global: 20 mm; Provider Global: 20 mm Swollen: 0 ; Tender: 2  Joint Exam 02/23/2022      Right  Left  Glenohumeral      Tender  Ankle      Tender     Investigation: No additional findings.  Imaging: CUP PACEART INCLINIC DEVICE CHECK  Result Date: 03/23/2022 CRT-P device check in clinic. Normal device function. Thresholds, sensing, impedance  consistent with previous measurements. Histograms appropriate for patient and level of activity. false mode switches for FF, one NSVT. Patient bi-ventricularly pacing 95%__% of the time. Device programmed with appropriate safety margins. Device heart failure diagnostics are within normal limits and stable over time. Estimated longevity _ERI is reached, gen change scheduled__. Patient enrolled in remote follow-up. Plan  to check device remotely in 3 months and every 6 months in office.   Recent Labs: Lab Results  Component Value Date   WBC 4.6 03/24/2022   HGB 11.6 (L) 03/24/2022   PLT 416 (H) 03/24/2022   NA 142 03/24/2022   K 3.3 (L) 03/24/2022   CL 106 03/24/2022   CO2 26 03/24/2022   GLUCOSE 93 03/24/2022   BUN 20 03/24/2022   CREATININE 1.16 03/24/2022   BILITOT 0.8 03/24/2022   ALKPHOS 61 03/24/2022   AST 12 (L) 03/24/2022   ALT 7 03/24/2022   PROT 7.0 03/24/2022   ALBUMIN 4.1 03/24/2022   CALCIUM 8.3 (L) 03/24/2022   GFRAA 83 05/10/2020    Speciality Comments: No specialty comments available.  Procedures:  No procedures performed Allergies: Bupropion, Cephalexin,  Fluoxetine, Ibuprofen, Prednisone, Temazepam, Trazodone and nefazodone, Fluoxetine hcl, and Prozac [fluoxetine hcl]   Assessment / Plan:     Visit Diagnoses: Rheumatoid arthritis involving multiple sites, unspecified whether rheumatoid factor present ([place]) - Plan: XR Hand 2 View Right, XR Hand 2 View Left, XR Foot 2 Views Right, XR Foot 2 Views Left, Rheumatoid factor, Cyclic citrul peptide antibody, IgG, Sedimentation rate, C-reactive protein  History of rheumatoid arthritis I do not see active synovitis on exam today.  There are chronic degenerative changes of the hands and feet.  We will recheck rheumatoid factor and CCP as well as sed rate and CRP for assessing current inflammatory activity.  X-rays of bilateral hands and feet today without erosive changes.  Also question alternate contribution to episodic inflammation such as gout especially if current inflammatory markers normal.  High risk medication use - Plan: Hepatitis B core antibody, IgM, Hepatitis B surface antigen, Hepatitis C antibody  Considering resuming RA medication most likely possible methotrexate.  Checking hepatitis B and C serology recent metabolic panel reviewed was okay.  Idiopathic chronic gout of multiple sites without tophus - Plan: Uric acid, allopurinol (ZYLOPRIM) 100 MG tablet  Checking uric acid level with history of gout he is on low-dose allopurinol with 100 mg daily.  Somewhat intermittent inflammatory symptoms could be consistent with chronic gouty arthritis if uric acid is above 6 will recommend allopurinol titration.  Orders: Orders Placed This Encounter  Procedures   XR Hand 2 View Right   XR Hand 2 View Left   XR Foot 2 Views Right   XR Foot 2 Views Left   Hepatitis B core antibody, IgM   Hepatitis B surface antigen   Hepatitis C antibody   Rheumatoid factor   Cyclic citrul peptide antibody, IgG   Sedimentation rate   C-reactive protein   Uric acid   Uric acid   Meds ordered this encounter   Medications   allopurinol (ZYLOPRIM) 100 MG tablet    Sig: Take 2 tablets (200 mg total) by mouth daily.    Dispense:  60 tablet    Refill:  1     Follow-Up Instructions: Return in about 2 weeks (around 03/09/2022) for New pt RA f/u 2wks.   Collier Salina, MD  Note - This record has been created using [employer].  Chart creation errors have  been sought, but may not always  have been located. Such creation errors do not reflect on  the standard of medical care.

## 2022-02-23 ENCOUNTER — Ambulatory Visit: Payer: Medicare Other

## 2022-02-23 ENCOUNTER — Ambulatory Visit (INDEPENDENT_AMBULATORY_CARE_PROVIDER_SITE_OTHER): Payer: Medicare Other

## 2022-02-23 ENCOUNTER — Ambulatory Visit: Payer: Medicare Other | Attending: Internal Medicine | Admitting: Internal Medicine

## 2022-02-23 ENCOUNTER — Encounter: Payer: Self-pay | Admitting: Internal Medicine

## 2022-02-23 VITALS — BP 102/65 | HR 73 | Resp 16 | Ht 70.0 in | Wt 242.8 lb

## 2022-02-23 DIAGNOSIS — Z79899 Other long term (current) drug therapy: Secondary | ICD-10-CM

## 2022-02-23 DIAGNOSIS — M069 Rheumatoid arthritis, unspecified: Secondary | ICD-10-CM

## 2022-02-23 DIAGNOSIS — M79642 Pain in left hand: Secondary | ICD-10-CM | POA: Diagnosis not present

## 2022-02-23 DIAGNOSIS — M1A09X0 Idiopathic chronic gout, multiple sites, without tophus (tophi): Secondary | ICD-10-CM

## 2022-02-23 DIAGNOSIS — M79672 Pain in left foot: Secondary | ICD-10-CM

## 2022-02-23 DIAGNOSIS — M79671 Pain in right foot: Secondary | ICD-10-CM

## 2022-02-23 DIAGNOSIS — M79641 Pain in right hand: Secondary | ICD-10-CM

## 2022-02-23 NOTE — Progress Notes (Deleted)
HPI: FU cardiomyopathy/CHF. Echocardiogram in August of 2013 showed an ejection fraction of 35-40%. Cardiac catheterization in September of 2013 showed mild nonobstructive coronary disease. There was no hemodynamic evidence of restriction. Pulmonary capillary wedge pressure 10. Cardiac MRI in September of 2013 showed an ejection fraction of 34% with diffuse hypokinesis. There was no hyperenhancement or scar tissue and no evidence of cardiac hemochromatosis. 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 Patient subsequently had CRTP placed. Nuclear study March 2017 showed ejection fraction 32. There was prior inferior infarct but no ischemia. Treated medically as felt likely attenuation artifact.  Patient admitted September 2023 with congestive heart failure.  Echocardiogram showed ejection fraction 07%, grade 2 diastolic dysfunction, mild pulmonary hypertension, severe left atrial enlargement, mild right atrial enlargement, mild mitral regurgitation.  Venous Dopplers September 2023 showed no DVT.  CTA September 2023 showed no pulmonary embolus but there was note of pleural effusions.  Patient was diuresed with improvement.  Since last seen,   Current Outpatient Medications  Medication Sig Dispense Refill   acetaminophen (TYLENOL) 500 MG tablet Take 1,000 mg by mouth every 6 (six) hours as needed for mild pain.     allopurinol (ZYLOPRIM) 300 MG tablet Take 300 mg by mouth daily.     budesonide (PULMICORT) 0.5 MG/2ML nebulizer solution Inhale one vial in nebulizer twice a day. **Rinse mouth after use. (Patient taking differently: Take 0.5 mg by nebulization 2 (two) times daily. **Rinse mouth after use.**) 60 mL 10   carvedilol (COREG) 3.125 MG tablet Take 1 tablet (3.125 mg total) by mouth 2 (two) times daily with a meal. 180 tablet 3   chlorzoxazone (PARAFON) 500 MG tablet Take 500 mg by mouth 2 (two) times daily.     colestipol (COLESTID) 1 g tablet Take 1 g by mouth 2 (two) times  daily.     Cyanocobalamin (B-12 PO) Take 1 tablet by mouth daily.     diclofenac Sodium (VOLTAREN) 1 % GEL Apply 2 g topically 4 (four) times daily. Apply to left shoulder for pain. 371 g 0   folic acid (FOLVITE) 1 MG tablet Take 1 mg by mouth daily.     furosemide (LASIX) 40 MG tablet Take 1 tablet (40 mg total) by mouth daily. Take 1 tablet twice a day in case of weight gain 2 to 3 lbs in 24 hrs or 5 lbs in 7 days, until weight back to baseline. 30 tablet 0   guaiFENesin-dextromethorphan (ROBITUSSIN DM) 100-10 MG/5ML syrup Take 5 mLs by mouth every 6 (six) hours as needed for cough. 237 mL 0   hydroxyurea (HYDREA) 500 MG capsule Take 2 capsules (1,000 mg total) by mouth daily. May take with food to minimize GI side effects. Currently taking 500 mg daily but MD note says may need to increase on 12/27/2020 to twice daily. (Patient taking differently: Take 1,000 mg by mouth daily. May take with food to minimize GI side effects.) 180 capsule 5   insulin degludec (TRESIBA FLEXTOUCH) 100 UNIT/ML SOPN FlexTouch Pen Inject 20 Units into the skin daily after breakfast.      ipratropium-albuterol (DUONEB) 0.5-2.5 (3) MG/3ML SOLN Inhale one vial in nebulizer 3 times a day and every 6 hrs as needed. (Patient taking differently: Take 3 mLs by nebulization 2 (two) times daily.) 120 mL 11   losartan (COZAAR) 25 MG tablet Take 1 tablet (25 mg total) by mouth daily. 30 tablet 0   Magnesium Oxide 400 MG CAPS Take 2  capsules (800 mg total) by mouth in the morning and at bedtime. 90 capsule 1   omeprazole (PRILOSEC) 20 MG capsule Take 20 mg by mouth 2 (two) times daily before a meal.      oxybutynin (DITROPAN XL) 15 MG 24 hr tablet Take 15 mg by mouth daily.     rosuvastatin (CRESTOR) 5 MG tablet Take 5 mg by mouth daily.     spironolactone (ALDACTONE) 25 MG tablet Take 0.5 tablets (12.5 mg total) by mouth daily.     tamsulosin (FLOMAX) 0.4 MG CAPS capsule Take 0.4 mg by mouth 2 (two) times daily.      Turmeric (QC  TUMERIC COMPLEX) 500 MG CAPS Take 500 mg by mouth daily.     zolpidem (AMBIEN) 5 MG tablet Take 1 tablet (5 mg total) by mouth at bedtime as needed for sleep. 15 tablet 0   No current facility-administered medications for this visit.   Facility-Administered Medications Ordered in Other Visits  Medication Dose Route Frequency Provider Last Rate Last Admin   0.9 %  sodium chloride infusion   Intravenous Continuous Volanda Napoleon, MD   Stopped at 05/16/13 1115     Past Medical History:  Diagnosis Date   AV block, 2nd degree 2015   St. Jude Allure Quadra pulse generator [id number], model PM 3242   Back pain    Cardiomyopathy (HCC)    Nonischemic 45%.    CHF (congestive heart failure) ([place])    Gout    Hemochromatosis    Hypertension    Hypospadias Jun 03, 1950   born with   Nephrolithiasis    Polycythemia vera(238.4)     Past Surgical History:  Procedure Laterality Date   ANKLE SURGERY     BI-VENTRICULAR PACEMAKER INSERTION N/A 04/29/2014   Procedure: BI-VENTRICULAR PACEMAKER INSERTION (CRT-P);  Surgeon: Deboraha Sprang, MD; Laterality: Left  St. Jude Allure Quadra pulse generator [phone], model [place] 3242   CARDIAC SURGERY     COLONOSCOPY N/A 02/15/2020   Procedure: COLONOSCOPY;  Surgeon: Ronnette Juniper, MD;  Location: [facility];  Service: Gastroenterology;  Laterality: N/A;   ESOPHAGOGASTRODUODENOSCOPY (EGD) WITH PROPOFOL N/A 02/15/2020   Procedure: ESOPHAGOGASTRODUODENOSCOPY (EGD) WITH PROPOFOL;  Surgeon: Ronnette Juniper, MD;  Location: [facility];  Service: Gastroenterology;  Laterality: N/A;   PILONIDAL CYST EXCISION     POSTERIOR LAMINECTOMY / DECOMPRESSION LUMBAR SPINE     TONSILLECTOMY      Social History   Socioeconomic History   Marital status: Married    Spouse name: Mardene Celeste   Number of children: 0   Years of education: Not on file   Highest education level: High school graduate  Occupational History   Occupation: Retired  Tobacco Use   Smoking status: Former     Packs/day: 1.00    Years: 44.00    Total pack years: 44.00    Types: Cigarettes    Start date: 06/05/1968    Quit date: 04/05/2013    Years since quitting: 8.8   Smokeless tobacco: Never   Tobacco comments:    quit in 2014  Vaping Use   Vaping Use: Never used  Substance and Sexual Activity   Alcohol use: Yes    Alcohol/week: 0.0 standard drinks of alcohol    Comment: rare   Drug use: No   Sexual activity: Not Currently  Other Topics Concern   Not on file  Social History Narrative   Lives at home with wife in a one story home.  Has no children.  Does  not work.  Getting workman's comp.  Education: 4 years trade school.    Social Determinants of Health   Financial Resource Strain: Low Risk  (02/06/2022)   Overall Financial Resource Strain (CARDIA)    Difficulty of Paying Living Expenses: Not very hard  Food Insecurity: No Food Insecurity (02/06/2022)   Hunger Vital Sign    Worried About Running Out of Food in the Last Year: Never true    Ran Out of Food in the Last Year: Never true  Transportation Needs: No Transportation Needs (02/06/2022)   PRAPARE - Hydrologist (Medical): No    Lack of Transportation (Non-Medical): No  Physical Activity: Not on file  Stress: Not on file  Social Connections: Not on file  Intimate Partner Violence: Not on file    Family History  Problem Relation Age of Onset   Heart disease Maternal Grandmother        Pacemaker, [place]   Stroke Mother    Other Father        Deceased, car fell on him   Diabetes Sister    Hypertension Sister     ROS: no fevers or chills, productive cough, hemoptysis, dysphasia, odynophagia, melena, hematochezia, dysuria, hematuria, rash, seizure activity, orthopnea, PND, pedal edema, claudication. Remaining systems are negative.  Physical Exam: Well-developed well-nourished in no acute distress.  Skin is warm and dry.  HEENT is normal.  Neck is supple.  Chest is clear to auscultation with  normal expansion.  Cardiovascular exam is regular rate and rhythm.  Abdominal exam nontender or distended. No masses palpated. Extremities show no edema. neuro grossly intact  ECG- personally reviewed  A/P  1 chronic combined systolic/diastolic congestive heart failure-patient appears to be doing reasonably well from a volume standpoint.  We will continue diuretics at present dose including Lasix and spironolactone.  I have not added an SGLT2 inhibitor due to use of Hydrea, large pannus and risk of infection.  2 nonischemic cardiomyopathy-patient previously did not tolerate Entresto due to hypotension.  Continue ARB and beta-blocker.  3 CRT-P-patient is followed by electrophysiology.  4 history of coronary calcification-continue statin.  5 hyperlipidemia-continue statin.  6 obesity-  7 hypertension-patient's blood pressure is controlled.  Continue present medications.  Kirk Ruths, MD

## 2022-02-25 LAB — HEPATITIS C ANTIBODY: Hepatitis C Ab: NONREACTIVE

## 2022-02-25 LAB — RHEUMATOID FACTOR: Rheumatoid fact SerPl-aCnc: <14 IU/mL (ref <14)

## 2022-02-25 LAB — HEPATITIS B SURFACE ANTIGEN: Hepatitis B Surface Ag: NONREACTIVE

## 2022-02-25 LAB — URIC ACID: Uric Acid, Serum: 9.4 mg/dL — ABNORMAL HIGH (ref 4.0–8.0)

## 2022-02-25 LAB — HEPATITIS B CORE ANTIBODY, IGM: Hep B C IgM: NONREACTIVE

## 2022-02-25 LAB — C-REACTIVE PROTEIN: CRP: 5.2 mg/L (ref <8.0)

## 2022-02-25 LAB — CYCLIC CITRUL PEPTIDE ANTIBODY, IGG: Cyclic Citrullin Peptide Ab: 172 UNITS — ABNORMAL HIGH

## 2022-02-25 LAB — SEDIMENTATION RATE: Sed Rate: 22 mm/h — ABNORMAL HIGH (ref 0–20)

## 2022-02-27 ENCOUNTER — Ambulatory Visit (INDEPENDENT_AMBULATORY_CARE_PROVIDER_SITE_OTHER): Payer: Medicare Other

## 2022-02-27 ENCOUNTER — Other Ambulatory Visit (HOSPITAL_BASED_OUTPATIENT_CLINIC_OR_DEPARTMENT_OTHER): Payer: Self-pay

## 2022-02-27 DIAGNOSIS — I5042 Chronic combined systolic (congestive) and diastolic (congestive) heart failure: Secondary | ICD-10-CM

## 2022-02-27 DIAGNOSIS — Z950 Presence of cardiac pacemaker: Secondary | ICD-10-CM

## 2022-02-27 NOTE — Progress Notes (Signed)
EPIC Encounter for ICM Monitoring  Patient Name: William Villanueva is a 71 y.o. male Date: 02/27/2022 Primary Care Physican: Antony Contras, MD Primary Cardiologist: Stanford Breed Electrophysiologist: Vergie Living Pacing: 95%            01/23/2022 Weight: 260 lbs 01/31/2022 Weight: 256 lbs 02/19/2022 Weight: 240 lbs 02/20/2022 Weight: 242 lbs  02/27/2022 Weight: 245 lbs   Battery Longevity: <3 months   AT/AF Burden <1%                                                            Spoke with patient and heart failure questions reviewed.  Transmission results reviewed.  Pt reports generally not feeling well today.                CorVue thoracic impedance suggesting fluid levels returned to normal.   Prescribed:   Furosemide 40 mg Take 1 tablet (40 mg total) by mouth daily. Take 1 tablet twice a day in case of weight gain 2 to 3 lbs in 24 hrs or 5 lbs in 7 days, until weight back to baseline. Spironolactone 25 mg take 0.5 tablet (12.5 mg total) once a day   Labs: 09/16/2021 Creatinine 1.10, BUN 17, Potassium 4.3, Sodium 139, GFR >60 08/12/2021 Creatinine 1.30, BUN 18, Potassium 4.5, Sodium 138, GFR 59 07/08/2021 Creatinine 1.58, BUN 28, Potassium 4.6, Sodium 137, GFR 47 06/10/2021 Creatinine 1.22, BUN 20, Potassium 4.5, Sodium 139, GFR >60 A complete set of results can be found in Results Review.   Recommendations:  No changes and encouraged to call if experiencing any fluid symptoms.   Follow-up plan: ICM clinic phone appointment on 03/27/2022.   91 day device clinic remote transmission 05/26/2022.     EP/Cardiology Office Visits:   03/09/2022 with Dr Stanford Breed.  Recall 08/14/2022 with Oda Kilts, PA   Copy of ICM check sent to Dr. Caryl Comes.   3 month ICM trend: 02/27/2022.    12-14 Month ICM trend:     Rosalene Billings, RN 02/27/2022 8:42 AM

## 2022-03-02 ENCOUNTER — Other Ambulatory Visit (HOSPITAL_BASED_OUTPATIENT_CLINIC_OR_DEPARTMENT_OTHER): Payer: Self-pay

## 2022-03-02 MED ORDER — FLUAD QUADRIVALENT 0.5 ML IM PRSY
PREFILLED_SYRINGE | INTRAMUSCULAR | 0 refills | Status: DC
  Filled 2022-03-02: qty 0.5, 1d supply, fill #0

## 2022-03-02 MED ORDER — COMIRNATY 30 MCG/0.3ML IM SUSY
PREFILLED_SYRINGE | INTRAMUSCULAR | 0 refills | Status: DC
  Filled 2022-03-02: qty 0.3, 1d supply, fill #0

## 2022-03-03 ENCOUNTER — Telehealth: Payer: Self-pay | Admitting: Pulmonary Disease

## 2022-03-03 MED ORDER — ALLOPURINOL 100 MG PO TABS: 200.0000 mg | ORAL_TABLET | Freq: Every day | ORAL | 1 refills | Status: DC

## 2022-03-03 MED ORDER — BUDESONIDE 0.5 MG/2ML IN SUSP: RESPIRATORY_TRACT | 1 refills | Status: DC

## 2022-03-03 NOTE — Progress Notes (Signed)
Lab results show positive antibody for rheumatoid arthritis. His sedimentation rate is 22 which is not very high so I am not sure how much this problem is active right now.  Xrays of hands and feet show a lot of osteoarthritis, there is no significant bone loss or erosion from RA.  His uric acid level is 9.4 this is still much higher than our goal of 6 for controlling gout. I think he needs to increase the allopurinol dose. His current prescription appears to be for 100 mg daily he can double this to 200 mg daily (2 tablets). I will send a new prescription in case he needs to refill this before our follow up but he can take the tablets he already has. If he goes ahead and increases we can recheck the level when we follow up.

## 2022-03-03 NOTE — Telephone Encounter (Signed)
Called and spoke with patient. He is aware that I will send in his refill for him. Nothing further needed.

## 2022-03-09 ENCOUNTER — Ambulatory Visit: Payer: Medicare Other | Admitting: Cardiology

## 2022-03-13 ENCOUNTER — Telehealth: Payer: Self-pay

## 2022-03-13 DIAGNOSIS — G4733 Obstructive sleep apnea (adult) (pediatric): Secondary | ICD-10-CM | POA: Diagnosis not present

## 2022-03-13 NOTE — Telephone Encounter (Signed)
Pt has been scheduled with Tommye Standard, PA-C on 03/23/2022 to discuss device generator change.

## 2022-03-13 NOTE — Telephone Encounter (Signed)
Device alert, ERI reached 11/3 - route to triage.  Called patient to advise and he needs apt with Dr. Caryl Comes to discuss gen change.  Patient voiced understanding.

## 2022-03-16 DIAGNOSIS — I5042 Chronic combined systolic (congestive) and diastolic (congestive) heart failure: Secondary | ICD-10-CM | POA: Diagnosis not present

## 2022-03-16 DIAGNOSIS — J449 Chronic obstructive pulmonary disease, unspecified: Secondary | ICD-10-CM | POA: Diagnosis not present

## 2022-03-16 DIAGNOSIS — J9611 Chronic respiratory failure with hypoxia: Secondary | ICD-10-CM | POA: Diagnosis not present

## 2022-03-16 DIAGNOSIS — G4700 Insomnia, unspecified: Secondary | ICD-10-CM | POA: Diagnosis not present

## 2022-03-16 DIAGNOSIS — R053 Chronic cough: Secondary | ICD-10-CM | POA: Diagnosis not present

## 2022-03-17 DIAGNOSIS — D224 Melanocytic nevi of scalp and neck: Secondary | ICD-10-CM | POA: Diagnosis not present

## 2022-03-17 DIAGNOSIS — L570 Actinic keratosis: Secondary | ICD-10-CM | POA: Diagnosis not present

## 2022-03-21 NOTE — Progress Notes (Unsigned)
Cardiology Office Note Date:  03/21/2022  Patient ID:  William Villanueva 1950-05-12, MRN [id number] PCP:  Antony Contras, MD  Cardiologist:  Dr. Stanford Breed Electrophysiologist: Dr. Caryl Comes    Chief Complaint:  *** ERI  History of Present Illness: William Villanueva is a 71 y.o. male with history of NICM, CHB, CRT-P, polycythemia vera, hemochromatosis, orthostatic hypotension.  In review of notes: Cardiac catheterization in September of 2013 showed mild nonobstructive coronary disease. There was no hemodynamic evidence of restriction. Pulmonary capillary wedge pressure 10.  Cardiac MRI in September of 2013 showed an ejection fraction of 34% with diffuse hypokinesis. There was no hyperenhancement or scar tissue and no evidence of cardiac hemochromatosis  2021, EF again reduced >> ECG optimization >> 45-50% in 2022  He last saw Dr. Caryl Comes 08/19/21, doing ok, mild chronic SOB, poor sleep. Remained quite orthostatic, his coreg reduced ans well as his losartan dose, was moved to HD timing. Volume stable, HF meds deferred to Dr. Stanford Breed. Recommended he discuss his difficult sleep with his PMD.  Chest CT 8/23 showed small bilateral pleural effusion, pulmonary artery enlargement, emphysema, and coronary atherosclerosis.  His CorVue readings were abnormal and he contacted the cardiology office and noted increased dyspnea.  He was seen in the clinic by Dr. Stanford Breed for evaluation.  He had gained 5 pounds in the previous week.  He noted progressive dyspnea on exertion and also at rest.  His furosemide was increased to 80 mg daily.  He tolerated increase furosemide well.  He lost 3 pounds over several days and noted improvement with his breathing.  He felt it was back to his baseline.  He denied exertional chest discomfort.  He denied lower extremity swelling and syncope.   He was seen in the hospital on 02/14/2022 for ongoing cough and left shoulder pain.  He denied fever and chills nausea and vomiting.   He was taking Robitussin which was helping with his cough.  He was on 2 L supplemental oxygen.  His left shoulder pain has been present for 1 month.  He was losing weight and had decreased lower extremity swelling.  His BMP showed stable electrolytes and creatinine values.  His BNP was slightly elevated at 309.  His CBC was also stable.  His high-sensitivity troponins were mildly elevated at 22.  His chest x-ray showed subtle bibasilar opacities which was felt to be atelectasis or early pneumonia.  He was prescribed and oral course of antibiotics.   He followed up in the heart failure TOC clinic on 02/17/2022.  His breathing was at baseline.  He had lost around 14 pounds.  He is tolerating his medications well.  He was using salt restriction.  TTE with LVEF 40%, global hypokinesis   He last saw gen cards team by Thomasene Mohair, 02/21/22, described sedentary lifestyle, worried about blister on his foot/painful (w/ill-fitting shoes).  Advised increase in activity, low sodium diet.  Felt to be volume stable, no symptoms of volume/angina. As well as a watch on his foot/blister  Followed by ICM as well.  Device alert for ERI status 03/10/22.  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 Jude Allure Quadra pulse generator [id number], model PM 3242   Back pain    Cardiomyopathy (HCC)    Nonischemic 45%.    CHF (congestive heart failure) (HCC)    Gout  Hemochromatosis    Hypertension    Hypospadias [birth year]   born with   Nephrolithiasis    Polycythemia vera(238.4)     Past Surgical History:  Procedure Laterality Date   ANKLE SURGERY     BI-VENTRICULAR PACEMAKER INSERTION N/A 04/29/2014   Procedure: BI-VENTRICULAR PACEMAKER INSERTION (CRT-P);  Surgeon: Deboraha Sprang, MD; Laterality: Left  St. Jude Allure Quadra pulse generator [id number], model [place] 3242    CARDIAC SURGERY     COLONOSCOPY N/A 02/15/2020   Procedure: COLONOSCOPY;  Surgeon: Ronnette Juniper, MD;  Location: [facility];  Service: Gastroenterology;  Laterality: N/A;   ESOPHAGOGASTRODUODENOSCOPY (EGD) WITH PROPOFOL N/A 02/15/2020   Procedure: ESOPHAGOGASTRODUODENOSCOPY (EGD) WITH PROPOFOL;  Surgeon: Ronnette Juniper, MD;  Location: [facility];  Service: Gastroenterology;  Laterality: N/A;   PILONIDAL CYST EXCISION     POSTERIOR LAMINECTOMY / DECOMPRESSION LUMBAR SPINE     TONSILLECTOMY      Current Outpatient Medications  Medication Sig Dispense Refill   acetaminophen (TYLENOL) 500 MG tablet Take 1,000 mg by mouth every 6 (six) hours as needed for mild pain.     allopurinol (ZYLOPRIM) 100 MG tablet Take 2 tablets (200 mg total) by mouth daily. 60 tablet 1   budesonide (PULMICORT) 0.5 MG/2ML nebulizer solution Inhale one vial in nebulizer twice a day. **Rinse mouth after use. 180 mL 1   carvedilol (COREG) 3.125 MG tablet Take 1 tablet (3.125 mg total) by mouth 2 (two) times daily with a meal. 180 tablet 3   chlorzoxazone (PARAFON) 500 MG tablet Take 500 mg by mouth 2 (two) times daily.     colestipol (COLESTID) 1 g tablet Take 1 g by mouth 2 (two) times daily.     COVID-19 mRNA vaccine 2023-2024 (COMIRNATY) syringe Inject into the muscle. 0.3 mL 0   Cyanocobalamin (B-12 PO) Take 1 tablet by mouth daily.     diclofenac Sodium (VOLTAREN) 1 % GEL Apply 2 g topically 4 (four) times daily. Apply to left shoulder for pain. 462 g 0   folic acid (FOLVITE) 1 MG tablet Take 1 mg by mouth daily.     furosemide (LASIX) 40 MG tablet Take 1 tablet (40 mg total) by mouth daily. Take 1 tablet twice a day in case of weight gain 2 to 3 lbs in 24 hrs or 5 lbs in 7 days, until weight back to baseline. 30 tablet 0   guaiFENesin-dextromethorphan (ROBITUSSIN DM) 100-10 MG/5ML syrup Take 5 mLs by mouth every 6 (six) hours as needed for cough. 237 mL 0   hydroxyurea (HYDREA) 500 MG capsule Take 2 capsules (1,000 mg  total) by mouth daily. May take with food to minimize GI side effects. Currently taking 500 mg daily but MD note says may need to increase on 12/27/2020 to twice daily. (Patient taking differently: Take 1,000 mg by mouth daily. May take with food to minimize GI side effects.) 180 capsule 5   influenza vaccine adjuvanted (FLUAD QUADRIVALENT) 0.5 ML injection Inject into the muscle. 0.5 mL 0   insulin degludec (TRESIBA FLEXTOUCH) 100 UNIT/ML SOPN FlexTouch Pen Inject 20 Units into the skin daily after breakfast.      ipratropium-albuterol (DUONEB) 0.5-2.5 (3) MG/3ML SOLN Inhale one vial in nebulizer 3 times a day and every 6 hrs as needed. (Patient taking differently: Take 3 mLs by nebulization 2 (two) times daily.) 120 mL 11   losartan (COZAAR) 25 MG tablet Take 1 tablet (25 mg total) by mouth daily. 30 tablet 0   Magnesium Oxide 400  MG CAPS Take 2 capsules (800 mg total) by mouth in the morning and at bedtime. 90 capsule 1   omeprazole (PRILOSEC) 20 MG capsule Take 20 mg by mouth 2 (two) times daily before a meal.      oxybutynin (DITROPAN XL) 15 MG 24 hr tablet Take 15 mg by mouth daily.     rosuvastatin (CRESTOR) 5 MG tablet Take 5 mg by mouth daily.     spironolactone (ALDACTONE) 25 MG tablet Take 0.5 tablets (12.5 mg total) by mouth daily.     tamsulosin (FLOMAX) 0.4 MG CAPS capsule Take 0.4 mg by mouth 2 (two) times daily.      Turmeric (QC TUMERIC COMPLEX) 500 MG CAPS Take 500 mg by mouth daily.     zolpidem (AMBIEN) 5 MG tablet Take 1 tablet (5 mg total) by mouth at bedtime as needed for sleep. 15 tablet 0   No current facility-administered medications for this visit.   Facility-Administered Medications Ordered in Other Visits  Medication Dose Route Frequency Provider Last Rate Last Admin   0.9 %  sodium chloride infusion   Intravenous Continuous Volanda Napoleon, MD   Stopped at 05/16/13 1115    Allergies:   Bupropion, Cephalexin, Fluoxetine, Ibuprofen, Prednisone, Temazepam, Trazodone  and nefazodone, Fluoxetine hcl, and Prozac [fluoxetine hcl]   Social History:  The patient  reports that he quit smoking about 8 years ago. His smoking use included cigarettes. He started smoking about 53 years ago. He has a 44.00 pack-year smoking history. He has never been exposed to tobacco smoke. He has never used smokeless tobacco. He reports current alcohol use. He reports that he does not use drugs.   Family History:  The patient's family history includes Diabetes in his sister; Heart disease in his maternal grandmother; Hypertension in his sister; Other in his father; Stroke in his mother.  ROS:  Please see the history of present illness.    All other systems are reviewed and otherwise negative.   PHYSICAL EXAM:  VS:  There were no vitals taken for this visit. BMI: There is no height or weight on file to calculate BMI. Well nourished, well developed, in no acute distress HEENT: normocephalic, atraumatic Neck: no JVD, carotid bruits or masses Cardiac:  *** RRR; no significant murmurs, no rubs, or gallops Lungs:  *** CTA b/l, no wheezing, rhonchi or rales Abd: soft, nontender MS: no deformity or atrophy Ext: *** no edema Skin: warm and dry, no rash Neuro:  No gross deficits appreciated Psych: euthymic mood, full affect  *** PPM site is stable, no tethering or discomfort   EKG:  done today and reviewed by myself ***  Device interrogation done today and reviewed by myself:  ***  02/04/2022: TTE 1. Left ventricular ejection fraction, by estimation, is 40%. The left  ventricle has moderately decreased function. The left ventricle  demonstrates global hypokinesis. The left ventricular internal cavity size  was severely dilated. Left ventricular  diastolic parameters are consistent with Grade II diastolic dysfunction  (pseudonormalization). Elevated left atrial pressure. The E/e' is 19.8.   2. Right ventricular systolic function is normal. The right ventricular  size is mildly  enlarged. There is mildly elevated pulmonary artery  systolic pressure.   3. Left atrial size was severely dilated.   4. Right atrial size was mildly dilated.   5. The mitral valve is grossly normal. Mild mitral valve regurgitation.   6. The aortic valve is tricuspid. There is mild thickening of the aortic  valve. Aortic valve regurgitation is not visualized. Aortic valve  sclerosis is present, with no evidence of aortic valve stenosis.   7. The inferior vena cava is dilated in size with >50% respiratory  variability, suggesting right atrial pressure of 8 mmHg.   Comparison(s): Compared to prior TTE on 06/2020, the LV is now severely  dilated with EF ~40% (previously moderately dilated with EF 45-50%).    06/21/20: TTE 1. Limited AV optimization study.   2. Left ventricular ejection fraction, by estimation, is 45 to 50%. The  left ventricle has mildly decreased function. The left ventricle  demonstrates global hypokinesis. The left ventricular internal cavity size  was moderately dilated. Left ventricular  diastolic parameters are consistent with Grade I diastolic dysfunction  (impaired relaxation).   3. Right ventricular systolic function is normal. The right ventricular  size is normal.   4. The mitral valve is normal in structure. No evidence of mitral valve  regurgitation. No evidence of mitral stenosis.   5. The aortic valve was not assessed.    01/14/2020: TTE IMPRESSIONS   1. Left ventricular ejection fraction, by estimation, is 30 to 35%. The  left ventricle has moderately decreased function. The left ventricle  demonstrates regional wall motion abnormalities (see scoring  diagram/findings for description). Left ventricular   diastolic parameters are consistent with Grade II diastolic dysfunction  (pseudonormalization).   2. Right ventricular systolic function is normal. The right ventricular  size is normal.   3. Left atrial size was moderately dilated.   4. The mitral  valve is normal in structure. Trivial mitral valve  regurgitation. No evidence of mitral stenosis.   5. Tricuspid valve regurgitation is moderate.   6. The aortic valve is normal in structure. Aortic valve regurgitation is  not visualized. No aortic stenosis is present.   7. The inferior vena cava is normal in size with greater than 50%  respiratory variability, suggesting right atrial pressure of 3 mmHg.   05/15/2018: LVEF 45-50% 2018: LVEF 45-50% 2016: LVEF 50-55% 2015 LVEF 35-40% > 45% 2014: LVEF 45% 2013 LVEF 35-40%  07/27/2015: stress myoview Nuclear stress EF: 32%. There was no ST segment deviation noted during stress. Defect 1: There is a medium defect of moderate severity present in the basal inferior, basal inferolateral, mid inferior and mid inferolateral location. Findings consistent with prior myocardial infarction. This is an intermediate risk study. The left ventricular ejection fraction is moderately decreased (30-44%).   Mildly dilated LV with moderately decreased LVEF. There is a fixed defect in the basal and mid inferior and inferolateral wall consistent with prior infarct.   Recent Labs: 12/12/2021: Pro B Natriuretic peptide (BNP) 414.0; TSH 0.26 02/07/2022: Magnesium 2.1 02/14/2022: B Natriuretic Peptide 309.1 02/16/2022: ALT 6; BUN 31; Creatinine 1.46; Hemoglobin 10.1; Platelet Count 704; Potassium 4.1; Sodium 136  02/06/2022: Cholesterol 70; HDL 26; LDL Cholesterol 27; Total CHOL/HDL Ratio 2.7; Triglycerides 83; VLDL 17   CrCl cannot be calculated (Patient's most recent lab result is older than the maximum 21 days allowed.).   Wt Readings from Last 3 Encounters:  02/23/22 242 lb 12.8 oz (110.1 kg)  02/21/22 243 lb (110.2 kg)  02/17/22 246 lb 6.4 oz (111.8 kg)     Other studies reviewed: Additional studies/records reviewed today include: summarized above  ASSESSMENT AND PLAN:  1. CRT-P     *** Intact function, *** ERI     *** programming changes as  above  2. NICM 3. Chronic CHF (systolic)  LVEF wobbling the last couple years     Last 40%,      GDMT limited by orthostatic hypotension    ***     Disposition: ***.   Current medicines are reviewed at length with the patient today.  The patient did not have any concerns regarding medicines.  Venetia Night, PA-C 03/21/2022 5:22 PM     [address] [place]  [phone] (office)  [phone] (fax)

## 2022-03-22 DIAGNOSIS — R197 Diarrhea, unspecified: Secondary | ICD-10-CM | POA: Diagnosis not present

## 2022-03-23 ENCOUNTER — Encounter: Payer: Self-pay | Admitting: Physician Assistant

## 2022-03-23 ENCOUNTER — Ambulatory Visit: Payer: Medicare Other | Admitting: Hematology & Oncology

## 2022-03-23 ENCOUNTER — Ambulatory Visit: Payer: Medicare Other | Attending: Physician Assistant | Admitting: Physician Assistant

## 2022-03-23 ENCOUNTER — Inpatient Hospital Stay: Payer: Medicare Other

## 2022-03-23 ENCOUNTER — Encounter: Payer: Self-pay | Admitting: *Deleted

## 2022-03-23 VITALS — BP 120/62 | HR 70 | Ht 70.0 in | Wt 238.6 lb

## 2022-03-23 DIAGNOSIS — I5022 Chronic systolic (congestive) heart failure: Secondary | ICD-10-CM

## 2022-03-23 DIAGNOSIS — Z950 Presence of cardiac pacemaker: Secondary | ICD-10-CM

## 2022-03-23 DIAGNOSIS — I428 Other cardiomyopathies: Secondary | ICD-10-CM

## 2022-03-23 DIAGNOSIS — Z01818 Encounter for other preprocedural examination: Secondary | ICD-10-CM | POA: Diagnosis not present

## 2022-03-23 DIAGNOSIS — Z45010 Encounter for checking and testing of cardiac pacemaker pulse generator [battery]: Secondary | ICD-10-CM

## 2022-03-23 DIAGNOSIS — Z79899 Other long term (current) drug therapy: Secondary | ICD-10-CM | POA: Diagnosis not present

## 2022-03-23 LAB — CUP PACEART INCLINIC DEVICE CHECK
Battery Remaining Longevity: 0 mo
Battery Voltage: 2.59 V
Brady Statistic RA Percent Paced: 1.1 %
Brady Statistic RV Percent Paced: 95 %
Date Time Interrogation Session: 20231116180520
Implantable Lead Connection Status: 753985
Implantable Lead Connection Status: 753985
Implantable Lead Connection Status: 753985
Implantable Lead Implant Date: 20151223
Implantable Lead Implant Date: 20151223
Implantable Lead Implant Date: 20151223
Implantable Lead Location: 753858
Implantable Lead Location: 753859
Implantable Lead Location: 753860
Implantable Pulse Generator Implant Date: 20151223
Lead Channel Impedance Value: 375 Ohm
Lead Channel Impedance Value: 437.5 Ohm
Lead Channel Impedance Value: 600 Ohm
Lead Channel Pacing Threshold Amplitude: 1.125 V
Lead Channel Pacing Threshold Amplitude: 1.25 V
Lead Channel Pacing Threshold Amplitude: 1.25 V
Lead Channel Pacing Threshold Amplitude: 1.625 V
Lead Channel Pacing Threshold Pulse Width: 0.4 ms
Lead Channel Pacing Threshold Pulse Width: 0.6 ms
Lead Channel Pacing Threshold Pulse Width: 0.6 ms
Lead Channel Pacing Threshold Pulse Width: 0.7 ms
Lead Channel Sensing Intrinsic Amplitude: 2.8 mV
Lead Channel Sensing Intrinsic Amplitude: 7.6 mV
Lead Channel Setting Pacing Amplitude: 2.125
Lead Channel Setting Pacing Amplitude: 2.5 V
Lead Channel Setting Pacing Amplitude: 2.625
Lead Channel Setting Pacing Pulse Width: 0.4 ms
Lead Channel Setting Pacing Pulse Width: 0.7 ms
Lead Channel Setting Sensing Sensitivity: 2 mV
Pulse Gen Model: 3242
Pulse Gen Serial Number: 7701275

## 2022-03-23 NOTE — Patient Instructions (Signed)
Medication Instructions:    Your physician recommends that you continue on your current medications as directed. Please refer to the Current Medication list given to you today.  *If you need a refill on your cardiac medications before your next appointment, please call your pharmacy*   Lab Work:  BMET AND CBC TODAY    If you have labs (blood work) drawn today and your tests are completely normal, you will receive your results only by: [facility] (if you have MyChart) OR A paper copy in the mail If you have any lab test that is abnormal or we need to change your treatment, we will call you to review the results.   Testing/Procedures: SEE LETTER FOR GENERATOR CHANGE ON  04-03-22    Follow-Up: At [facility], you and your health needs are our priority.  As part of our continuing mission to provide you with exceptional heart care, we have created designated Provider Care Teams.  These Care Teams include your primary Cardiologist (physician) and Advanced Practice Providers (APPs -  Physician Assistants and Nurse Practitioners) who all work together to provide you with the care you need, when you need it.  We recommend signing up for the patient portal called "MyChart".  Sign up information is provided on this After Visit Summary.  MyChart is used to connect with patients for Virtual Visits (Telemedicine).  Patients are able to view lab/test results, encounter notes, upcoming appointments, etc.  Non-urgent messages can be sent to your provider as well.   To learn more about what you can do with MyChart, go to [email or web address].    Your next appointment:  10-14 DAYS AFTER 04-03-22  DEVICE CLINIC WOUND CHECK  3 month(s)  The format for your next appointment:   In Person  Provider:   [name], MD    Other Instructions   Important Information About Sugar

## 2022-03-24 ENCOUNTER — Inpatient Hospital Stay (HOSPITAL_BASED_OUTPATIENT_CLINIC_OR_DEPARTMENT_OTHER): Payer: Medicare Other | Admitting: Hematology & Oncology

## 2022-03-24 ENCOUNTER — Encounter: Payer: Self-pay | Admitting: Hematology & Oncology

## 2022-03-24 ENCOUNTER — Inpatient Hospital Stay: Payer: Medicare Other | Attending: Hematology & Oncology

## 2022-03-24 VITALS — BP 102/53 | HR 79 | Temp 97.5°F | Resp 17 | Ht 70.0 in | Wt 240.4 lb

## 2022-03-24 DIAGNOSIS — R634 Abnormal weight loss: Secondary | ICD-10-CM | POA: Diagnosis not present

## 2022-03-24 DIAGNOSIS — I441 Atrioventricular block, second degree: Secondary | ICD-10-CM | POA: Diagnosis not present

## 2022-03-24 DIAGNOSIS — I10 Essential (primary) hypertension: Secondary | ICD-10-CM | POA: Diagnosis not present

## 2022-03-24 DIAGNOSIS — Z7982 Long term (current) use of aspirin: Secondary | ICD-10-CM | POA: Diagnosis not present

## 2022-03-24 DIAGNOSIS — D45 Polycythemia vera: Secondary | ICD-10-CM | POA: Insufficient documentation

## 2022-03-24 DIAGNOSIS — R0602 Shortness of breath: Secondary | ICD-10-CM

## 2022-03-24 LAB — BASIC METABOLIC PANEL
BUN/Creatinine Ratio: 17 (ref 10–24)
BUN: 19 mg/dL (ref 8–27)
CO2: 22 mmol/L (ref 20–29)
Calcium: 8.6 mg/dL (ref 8.6–10.2)
Chloride: 105 mmol/L (ref 96–106)
Creatinine, Ser: 1.09 mg/dL (ref 0.76–1.27)
Glucose: 83 mg/dL (ref 70–99)
Potassium: 4.1 mmol/L (ref 3.5–5.2)
Sodium: 143 mmol/L (ref 134–144)
eGFR: 73 mL/min/{1.73_m2} (ref >59)

## 2022-03-24 LAB — CBC WITH DIFFERENTIAL (CANCER CENTER ONLY)
Abs Immature Granulocytes: 0.03 10*3/uL (ref 0.00–0.07)
Basophils Absolute: 0.2 10*3/uL — ABNORMAL HIGH (ref 0.0–0.1)
Basophils Relative: 3 %
Eosinophils Absolute: 0.1 10*3/uL (ref 0.0–0.5)
Eosinophils Relative: 2 %
HCT: 36.3 % — ABNORMAL LOW (ref 39.0–52.0)
Hemoglobin: 11.6 g/dL — ABNORMAL LOW (ref 13.0–17.0)
Immature Granulocytes: 1 %
Lymphocytes Relative: 14 %
Lymphs Abs: 0.7 10*3/uL (ref 0.7–4.0)
MCH: 34.2 pg — ABNORMAL HIGH (ref 26.0–34.0)
MCHC: 32 g/dL (ref 30.0–36.0)
MCV: 107.1 fL — ABNORMAL HIGH (ref 80.0–100.0)
Monocytes Absolute: 0.5 10*3/uL (ref 0.1–1.0)
Monocytes Relative: 10 %
Neutro Abs: 3.2 10*3/uL (ref 1.7–7.7)
Neutrophils Relative %: 70 %
Platelet Count: 416 10*3/uL — ABNORMAL HIGH (ref 150–400)
RBC: 3.39 MIL/uL — ABNORMAL LOW (ref 4.22–5.81)
RDW: 18.9 % — ABNORMAL HIGH (ref 11.5–15.5)
WBC Count: 4.6 10*3/uL (ref 4.0–10.5)
nRBC: 0 % (ref 0.0–0.2)

## 2022-03-24 LAB — LACTATE DEHYDROGENASE: LDH: 237 U/L — ABNORMAL HIGH (ref 98–192)

## 2022-03-24 LAB — CBC
Hematocrit: 37 % — ABNORMAL LOW (ref 37.5–51.0)
Hemoglobin: 11.9 g/dL — ABNORMAL LOW (ref 13.0–17.7)
MCH: 33.9 pg — ABNORMAL HIGH (ref 26.6–33.0)
MCHC: 32.2 g/dL (ref 31.5–35.7)
MCV: 105 fL — ABNORMAL HIGH (ref 79–97)
Platelets: 422 10*3/uL (ref 150–450)
RBC: 3.51 x10E6/uL — ABNORMAL LOW (ref 4.14–5.80)
RDW: 19.8 % — ABNORMAL HIGH (ref 11.6–15.4)
WBC: 4.7 10*3/uL (ref 3.4–10.8)

## 2022-03-24 LAB — CMP (CANCER CENTER ONLY)
ALT: 7 U/L (ref 0–44)
AST: 12 U/L — ABNORMAL LOW (ref 15–41)
Albumin: 4.1 g/dL (ref 3.5–5.0)
Alkaline Phosphatase: 61 U/L (ref 38–126)
Anion gap: 10 (ref 5–15)
BUN: 20 mg/dL (ref 8–23)
CO2: 26 mmol/L (ref 22–32)
Calcium: 8.3 mg/dL — ABNORMAL LOW (ref 8.9–10.3)
Chloride: 106 mmol/L (ref 98–111)
Creatinine: 1.16 mg/dL (ref 0.61–1.24)
GFR, Estimated: >60 mL/min (ref >60)
Glucose, Bld: 93 mg/dL (ref 70–99)
Potassium: 3.3 mmol/L — ABNORMAL LOW (ref 3.5–5.1)
Sodium: 142 mmol/L (ref 135–145)
Total Bilirubin: 0.8 mg/dL (ref 0.3–1.2)
Total Protein: 7 g/dL (ref 6.5–8.1)

## 2022-03-24 LAB — IRON AND IRON BINDING CAPACITY (CC-WL,HP ONLY)
Iron: 85 ug/dL (ref 45–182)
Saturation Ratios: 22 % (ref 17.9–39.5)
TIBC: 379 ug/dL (ref 250–450)
UIBC: 294 ug/dL (ref 117–376)

## 2022-03-24 LAB — FERRITIN: Ferritin: 18 ng/mL — ABNORMAL LOW (ref 24–336)

## 2022-03-24 NOTE — Progress Notes (Signed)
Hematology and Oncology Follow Up Visit  William Villanueva [id number] July 10, 1950 71 y.o. 03/24/2022   Principle Diagnosis:  Polycythemia vera-JAK2 positive Heart block-Mobitz II  Current Therapy:   Hydrea 500 mg p.o.BID -dose changed on 08/12/2021 Aspirin 81 mg by mouth daily Phlebotomy to maintain hematocrit below 45%     Interim History:  Mr.  Crass is back for followup.  I am amazed as much weight he is losing.  I am so happy for him.  His weight is now down to 240 pounds.  He says he just is not eating as much.  He is going have his pacemaker replaced on November 27.  He is still working.  He works at a [employer].  He has had no problems with nausea or vomiting.  He did have a problem with diarrhea but this is improved now.  He has had no problems with fever.  His platelet counts come down quite nicely.  We will keep his Hydrea dose the same.  He has had no fever.  He has had no rashes.  He has had no leg swelling.  Overall, I would say his performance status is probably ECOG 1.     Medications:  Current Outpatient Medications:    acetaminophen (TYLENOL) 500 MG tablet, Take 1,000 mg by mouth every 6 (six) hours as needed for mild pain., Disp: , Rfl:    allopurinol (ZYLOPRIM) 100 MG tablet, Take 2 tablets (200 mg total) by mouth daily., Disp: 60 tablet, Rfl: 1   budesonide (PULMICORT) 0.5 MG/2ML nebulizer solution, Inhale one vial in nebulizer twice a day. **Rinse mouth after use., Disp: 180 mL, Rfl: 1   carvedilol (COREG) 3.125 MG tablet, Take 1 tablet (3.125 mg total) by mouth 2 (two) times daily with a meal., Disp: 180 tablet, Rfl: 3   chlorzoxazone (PARAFON) 500 MG tablet, Take 500 mg by mouth 2 (two) times daily., Disp: , Rfl:    cholestyramine (QUESTRAN) 4 GM/DOSE powder, Take 1 g by mouth daily at 6 (six) AM., Disp: , Rfl:    citalopram (CELEXA) 10 MG tablet, 10 mg daily., Disp: , Rfl:    COVID-19 mRNA vaccine 2023-2024 (COMIRNATY) syringe, Inject into  the muscle., Disp: 0.3 mL, Rfl: 0   Cyanocobalamin (B-12 PO), Take 1 tablet by mouth daily., Disp: , Rfl:    diclofenac Sodium (VOLTAREN) 1 % GEL, Apply 2 g topically 4 (four) times daily. Apply to left shoulder for pain., Disp: 854 g, Rfl: 0   folic acid (FOLVITE) 1 MG tablet, Take 1 mg by mouth daily., Disp: , Rfl:    furosemide (LASIX) 40 MG tablet, Take 1 tablet (40 mg total) by mouth daily. Take 1 tablet twice a day in case of weight gain 2 to 3 lbs in 24 hrs or 5 lbs in 7 days, until weight back to baseline., Disp: 30 tablet, Rfl: 0   guaiFENesin-dextromethorphan (ROBITUSSIN DM) 100-10 MG/5ML syrup, Take 5 mLs by mouth every 6 (six) hours as needed for cough., Disp: 237 mL, Rfl: 0   hydroxyurea (HYDREA) 500 MG capsule, Take 2 capsules (1,000 mg total) by mouth daily. May take with food to minimize GI side effects. Currently taking 500 mg daily but MD note says may need to increase on 12/27/2020 to twice daily. (Patient taking differently: Take 1,000 mg by mouth daily. May take with food to minimize GI side effects.), Disp: 180 capsule, Rfl: 5   influenza vaccine adjuvanted (FLUAD QUADRIVALENT) 0.5 ML injection, Inject into the muscle.,  Disp: 0.5 mL, Rfl: 0   insulin degludec (TRESIBA FLEXTOUCH) 100 UNIT/ML SOPN FlexTouch Pen, Inject 20 Units into the skin daily after breakfast. , Disp: , Rfl:    ipratropium-albuterol (DUONEB) 0.5-2.5 (3) MG/3ML SOLN, Inhale one vial in nebulizer 3 times a day and every 6 hrs as needed. (Patient taking differently: Take 3 mLs by nebulization 2 (two) times daily.), Disp: 120 mL, Rfl: 11   Magnesium Oxide 400 MG CAPS, Take 2 capsules (800 mg total) by mouth in the morning and at bedtime., Disp: 90 capsule, Rfl: 1   omeprazole (PRILOSEC) 20 MG capsule, Take 20 mg by mouth 2 (two) times daily before a meal. , Disp: , Rfl:    oxybutynin (DITROPAN XL) 15 MG 24 hr tablet, Take 15 mg by mouth daily., Disp: , Rfl:    rosuvastatin (CRESTOR) 5 MG tablet, Take 5 mg by mouth  daily., Disp: , Rfl:    spironolactone (ALDACTONE) 25 MG tablet, Take 0.5 tablets (12.5 mg total) by mouth daily., Disp: , Rfl:    tamsulosin (FLOMAX) 0.4 MG CAPS capsule, Take 0.4 mg by mouth 2 (two) times daily. , Disp: , Rfl:    Turmeric (QC TUMERIC COMPLEX) 500 MG CAPS, Take 500 mg by mouth daily., Disp: , Rfl:    zolpidem (AMBIEN) 5 MG tablet, Take 1 tablet (5 mg total) by mouth at bedtime as needed for sleep., Disp: 15 tablet, Rfl: 0   losartan (COZAAR) 25 MG tablet, Take 1 tablet (25 mg total) by mouth daily., Disp: 30 tablet, Rfl: 0 No current facility-administered medications for this visit.  Facility-Administered Medications Ordered in Other Visits:    0.9 %  sodium chloride infusion, , Intravenous, Continuous, Prestin Munch, Rudell Cobb, MD, Stopped at 05/16/13 1115  Allergies:  Allergies  Allergen Reactions   Bupropion Hives and Other (See Comments)   Cephalexin Diarrhea and Other (See Comments)    Caused C-diff, also    Fluoxetine Itching   Ibuprofen Itching   Prednisone Itching and Other (See Comments)    Abdominal pain, also    Temazepam Other (See Comments)    Dizziness    Trazodone And Nefazodone Other (See Comments)    Dizziness   Fluoxetine Hcl Itching   Prozac [Fluoxetine Hcl] Itching    Past Medical History, Surgical history, Social history, and Family History were reviewed and updated.  Review of Systems: Review of Systems  Constitutional: Negative.   HENT: Negative.    Eyes: Negative.   Respiratory:  Positive for shortness of breath.   Cardiovascular:  Positive for chest pain.  Gastrointestinal: Negative.   Genitourinary: Negative.   Musculoskeletal:  Positive for back pain.  Skin: Negative.   Neurological: Negative.   Endo/Heme/Allergies: Negative.   Psychiatric/Behavioral: Negative.      Physical Exam:  height is 5' 10"  (1.778 m) and weight is 240 lb 6.4 oz (109 kg). His oral temperature is 97.5 F (36.4 C) (abnormal). His blood pressure is 102/53  (abnormal) and his pulse is 79. His respiration is 17 and oxygen saturation is 98%.   Physical Exam Vitals reviewed.  HENT:     Head: Normocephalic and atraumatic.  Eyes:     Pupils: Pupils are equal, round, and reactive to light.  Cardiovascular:     Rate and Rhythm: Normal rate and regular rhythm.     Heart sounds: Normal heart sounds.  Pulmonary:     Effort: Pulmonary effort is normal.     Breath sounds: Normal breath sounds.  Abdominal:  General: Bowel sounds are normal.     Palpations: Abdomen is soft.  Musculoskeletal:        General: No tenderness or deformity. Normal range of motion.     Cervical back: Normal range of motion.  Lymphadenopathy:     Cervical: No cervical adenopathy.  Skin:    General: Skin is warm and dry.     Findings: No erythema or rash.  Neurological:     Mental Status: He is alert and oriented to person, place, and time.  Psychiatric:        Behavior: Behavior normal.        Thought Content: Thought content normal.        Judgment: Judgment normal.      Lab Results  Component Value Date   WBC 4.6 03/24/2022   HGB 11.6 (L) 03/24/2022   HCT 36.3 (L) 03/24/2022   MCV 107.1 (H) 03/24/2022   PLT 416 (H) 03/24/2022     Chemistry      Component Value Date/Time   NA 143 03/23/2022 1633   NA 141 04/10/2017 0923   NA 140 04/05/2016 0739   K 4.1 03/23/2022 1633   K 3.5 04/10/2017 0923   K 4.2 04/05/2016 0739   CL 105 03/23/2022 1633   CL 108 04/10/2017 0923   CO2 22 03/23/2022 1633   CO2 24 04/10/2017 0923   CO2 20 (L) 04/05/2016 0739   BUN 19 03/23/2022 1633   BUN 12 04/10/2017 0923   BUN 14.7 04/05/2016 0739   CREATININE 1.09 03/23/2022 1633   CREATININE 1.46 (H) 02/16/2022 0755   CREATININE 1.1 04/10/2017 0923   CREATININE 1.1 04/05/2016 0739      Component Value Date/Time   CALCIUM 8.6 03/23/2022 1633   CALCIUM 8.4 04/10/2017 0923   CALCIUM 9.0 04/05/2016 0739   ALKPHOS 57 02/16/2022 0755   ALKPHOS 77 04/10/2017 0923    ALKPHOS 91 04/05/2016 0739   AST 12 (L) 02/16/2022 0755   AST 34 04/05/2016 0739   ALT 6 02/16/2022 0755   ALT 32 04/10/2017 0923   ALT 40 04/05/2016 0739   BILITOT 0.4 02/16/2022 0755   BILITOT 0.48 04/05/2016 0739      Impression and Plan: Mr. Delfavero is 71 year old gentleman with polycythemia vera.  Everything is doing okay from my point of view.  We have not had to phlebotomize him  for good 5 years.    The weight loss I think is really critical for him.  I am so happy that he has been losing weight.  This will help him out in many ways.  We will now plan to get him back in 2024.  We will get him back in a couple months.  Hopefully, if everything continues to stabilize, then we can move his appointments out to 3 months.   Volanda Napoleon, MD 11/17/20238:22 AM

## 2022-03-27 ENCOUNTER — Ambulatory Visit (INDEPENDENT_AMBULATORY_CARE_PROVIDER_SITE_OTHER): Payer: Medicare Other

## 2022-03-27 DIAGNOSIS — Z950 Presence of cardiac pacemaker: Secondary | ICD-10-CM

## 2022-03-27 DIAGNOSIS — I441 Atrioventricular block, second degree: Secondary | ICD-10-CM

## 2022-03-27 DIAGNOSIS — I5022 Chronic systolic (congestive) heart failure: Secondary | ICD-10-CM

## 2022-03-27 LAB — CUP PACEART REMOTE DEVICE CHECK
Battery Remaining Longevity: 0 mo
Battery Voltage: 2.59 V
Brady Statistic AP VP Percent: 1.5 %
Brady Statistic AP VS Percent: 1 %
Brady Statistic AS VP Percent: 88 %
Brady Statistic AS VS Percent: 3.1 %
Brady Statistic RA Percent Paced: 1 %
Date Time Interrogation Session: 20231120020014
Implantable Lead Connection Status: 753985
Implantable Lead Connection Status: 753985
Implantable Lead Connection Status: 753985
Implantable Lead Implant Date: 20151223
Implantable Lead Implant Date: 20151223
Implantable Lead Implant Date: 20151223
Implantable Lead Location: 753858
Implantable Lead Location: 753859
Implantable Lead Location: 753860
Implantable Pulse Generator Implant Date: 20151223
Lead Channel Impedance Value: 390 Ohm
Lead Channel Impedance Value: 430 Ohm
Lead Channel Impedance Value: 580 Ohm
Lead Channel Pacing Threshold Amplitude: 1.25 V
Lead Channel Pacing Threshold Amplitude: 1.25 V
Lead Channel Pacing Threshold Amplitude: 1.875 V
Lead Channel Pacing Threshold Pulse Width: 0.4 ms
Lead Channel Pacing Threshold Pulse Width: 0.6 ms
Lead Channel Pacing Threshold Pulse Width: 0.7 ms
Lead Channel Sensing Intrinsic Amplitude: 10.1 mV
Lead Channel Sensing Intrinsic Amplitude: 2.6 mV
Lead Channel Setting Pacing Amplitude: 2.25 V
Lead Channel Setting Pacing Amplitude: 2.5 V
Lead Channel Setting Pacing Amplitude: 2.875
Lead Channel Setting Pacing Pulse Width: 0.4 ms
Lead Channel Setting Pacing Pulse Width: 0.7 ms
Lead Channel Setting Sensing Sensitivity: 2 mV
Pulse Gen Model: 3242
Pulse Gen Serial Number: 7701275

## 2022-03-27 NOTE — Progress Notes (Signed)
EPIC Encounter for ICM Monitoring  Patient Name: William Villanueva is a 71 y.o. male Date: 03/27/2022 Primary Care Physican: Antony Contras, MD Primary Cardiologist: Stanford Breed Electrophysiologist: Vergie Living Pacing: 89%            02/27/2022 Weight: 245 lbs 03/29/2022 Weight: 238 lbs    AT/AF Burden <1%                                                            Spoke with patient and heart failure questions reviewed.  Transmission results reviewed.  Pt asymptomatic for fluid accumulation.  Pt is dieting and losing weight.     Battery change scheduled 11/27.              CorVue thoracic impedance suggesting normal fluid levels.   Prescribed:   Furosemide 40 mg Take 1 tablet (40 mg total) by mouth daily. Take 1 tablet twice a day in case of weight gain 2 to 3 lbs in 24 hrs or 5 lbs in 7 days, until weight back to baseline. Spironolactone 25 mg take 0.5 tablet (12.5 mg total) once a day   Labs: 03/24/2022 Creatinine 1.16, BUN 20, Potassium 3.3, Sodium 142 03/23/2022 Creatinine 1.09, BUN 19, Potassium 4.1, Sodium 143, GFR 73  02/16/2022 Creatinine 1.46, BUN 31, Potassium 4.1, Sodium 136  02/14/2022 Creatinine 1.44, BUN 33, Potassium 3.9, Sodium 137  02/10/2022 Creatinine 1.79, BUN 41, Potassium 4.2, Sodium 135  02/09/2022 Creatinine 1.89, BUN 34, Potassium 4.3, Sodium 136  A complete set of results can be found in Results Review.   Recommendations:  No changes and encouraged to call if experiencing any fluid symptoms.   Follow-up plan: ICM clinic phone appointment on 05/22/2022 ( to allow thoracic impedance baseline to develop).   91 day device clinic remote transmission 05/26/2022.     EP/Cardiology Office Visits:   08/18/2022 with Dr Stanford Breed.  06/26/2022 with Dr Caryl Comes   Copy of ICM check sent to Dr. Caryl Comes.    3 month ICM trend: 03/27/2022.    12-14 Month ICM trend:     Rosalene Billings, RN 03/27/2022 10:10 AM

## 2022-03-31 NOTE — Pre-Procedure Instructions (Signed)
Instructed patient on the following items: Arrival time 1030 Nothing to eat or drink after midnight No meds AM of procedure Responsible person to drive you home and stay with you for 24 hrs Wash with special soap night before and morning of procedure   Patient complains of cough for 2 months, was covid and flu neg.  Will let Dr Caryl Comes know and follow up with the patient.

## 2022-04-01 ENCOUNTER — Encounter (HOSPITAL_BASED_OUTPATIENT_CLINIC_OR_DEPARTMENT_OTHER): Payer: Self-pay | Admitting: Emergency Medicine

## 2022-04-01 ENCOUNTER — Emergency Department (HOSPITAL_BASED_OUTPATIENT_CLINIC_OR_DEPARTMENT_OTHER)
Admission: EM | Admit: 2022-04-01 | Discharge: 2022-04-01 | Disposition: A | Discharge status: Home / Self Care | Payer: Medicare Other | Attending: Emergency Medicine | Admitting: Emergency Medicine

## 2022-04-01 ENCOUNTER — Other Ambulatory Visit: Payer: Self-pay

## 2022-04-01 ENCOUNTER — Emergency Department (HOSPITAL_BASED_OUTPATIENT_CLINIC_OR_DEPARTMENT_OTHER): Payer: Medicare Other

## 2022-04-01 DIAGNOSIS — I509 Heart failure, unspecified: Secondary | ICD-10-CM | POA: Diagnosis not present

## 2022-04-01 DIAGNOSIS — Z794 Long term (current) use of insulin: Secondary | ICD-10-CM | POA: Insufficient documentation

## 2022-04-01 DIAGNOSIS — R000 Tachycardia, unspecified: Secondary | ICD-10-CM | POA: Insufficient documentation

## 2022-04-01 DIAGNOSIS — J449 Chronic obstructive pulmonary disease, unspecified: Secondary | ICD-10-CM | POA: Diagnosis not present

## 2022-04-01 DIAGNOSIS — R059 Cough, unspecified: Secondary | ICD-10-CM | POA: Diagnosis not present

## 2022-04-01 DIAGNOSIS — Z7951 Long term (current) use of inhaled steroids: Secondary | ICD-10-CM | POA: Insufficient documentation

## 2022-04-01 DIAGNOSIS — Z1152 Encounter for screening for COVID-19: Secondary | ICD-10-CM | POA: Insufficient documentation

## 2022-04-01 DIAGNOSIS — Z950 Presence of cardiac pacemaker: Secondary | ICD-10-CM | POA: Diagnosis not present

## 2022-04-01 DIAGNOSIS — R0602 Shortness of breath: Secondary | ICD-10-CM | POA: Diagnosis not present

## 2022-04-01 DIAGNOSIS — I4510 Unspecified right bundle-branch block: Secondary | ICD-10-CM | POA: Diagnosis not present

## 2022-04-01 DIAGNOSIS — R0600 Dyspnea, unspecified: Secondary | ICD-10-CM | POA: Diagnosis not present

## 2022-04-01 DIAGNOSIS — J210 Acute bronchiolitis due to respiratory syncytial virus: Secondary | ICD-10-CM

## 2022-04-01 DIAGNOSIS — R062 Wheezing: Secondary | ICD-10-CM

## 2022-04-01 DIAGNOSIS — B974 Respiratory syncytial virus as the cause of diseases classified elsewhere: Secondary | ICD-10-CM | POA: Insufficient documentation

## 2022-04-01 LAB — BASIC METABOLIC PANEL
Anion gap: 10 (ref 5–15)
BUN: 20 mg/dL (ref 8–23)
CO2: 26 mmol/L (ref 22–32)
Calcium: 7 mg/dL — ABNORMAL LOW (ref 8.9–10.3)
Chloride: 101 mmol/L (ref 98–111)
Creatinine, Ser: 1.25 mg/dL — ABNORMAL HIGH (ref 0.61–1.24)
GFR, Estimated: >60 mL/min (ref >60)
Glucose, Bld: 113 mg/dL — ABNORMAL HIGH (ref 70–99)
Potassium: 3.3 mmol/L — ABNORMAL LOW (ref 3.5–5.1)
Sodium: 137 mmol/L (ref 135–145)

## 2022-04-01 LAB — MAGNESIUM: Magnesium: 1 mg/dL — ABNORMAL LOW (ref 1.7–2.4)

## 2022-04-01 LAB — RESP PANEL BY RT-PCR (FLU A&B, COVID) ARPGX2
Influenza A by PCR: NEGATIVE
Influenza B by PCR: NEGATIVE
SARS Coronavirus 2 by RT PCR: NEGATIVE

## 2022-04-01 LAB — BRAIN NATRIURETIC PEPTIDE: B Natriuretic Peptide: 602.9 pg/mL — ABNORMAL HIGH (ref 0.0–100.0)

## 2022-04-01 LAB — TROPONIN I (HIGH SENSITIVITY)
Troponin I (High Sensitivity): 32 ng/L — ABNORMAL HIGH (ref <18)
Troponin I (High Sensitivity): 34 ng/L — ABNORMAL HIGH (ref <18)

## 2022-04-01 LAB — CBC
HCT: 33.7 % — ABNORMAL LOW (ref 39.0–52.0)
Hemoglobin: 10.8 g/dL — ABNORMAL LOW (ref 13.0–17.0)
MCH: 34.5 pg — ABNORMAL HIGH (ref 26.0–34.0)
MCHC: 32 g/dL (ref 30.0–36.0)
MCV: 107.7 fL — ABNORMAL HIGH (ref 80.0–100.0)
Platelets: 539 10*3/uL — ABNORMAL HIGH (ref 150–400)
RBC: 3.13 MIL/uL — ABNORMAL LOW (ref 4.22–5.81)
RDW: 20.5 % — ABNORMAL HIGH (ref 11.5–15.5)
WBC: 8 10*3/uL (ref 4.0–10.5)
nRBC: 0 % (ref 0.0–0.2)

## 2022-04-01 MED ORDER — BENZONATATE 100 MG PO CAPS: 100.0000 mg | ORAL_CAPSULE | Freq: Three times a day (TID) | ORAL | 0 refills | Status: DC

## 2022-04-01 MED ORDER — FUROSEMIDE 10 MG/ML IJ SOLN
60.0000 mg | Freq: Once | INTRAMUSCULAR | Status: AC
  Administered 2022-04-01: 60 mg via INTRAVENOUS
  Filled 2022-04-01: qty 6

## 2022-04-01 MED ORDER — DOXYCYCLINE HYCLATE 100 MG PO CAPS: 100.0000 mg | ORAL_CAPSULE | Freq: Two times a day (BID) | ORAL | 0 refills | Status: DC

## 2022-04-01 MED ORDER — FUROSEMIDE 40 MG PO TABS: 80.0000 mg | ORAL_TABLET | Freq: Every day | ORAL | 0 refills | Status: DC

## 2022-04-01 MED ORDER — DEXAMETHASONE 4 MG PO TABS: 12.0000 mg | ORAL_TABLET | Freq: Every day | ORAL | 0 refills | Status: AC

## 2022-04-01 MED ORDER — MAGNESIUM SULFATE 2 GM/50ML IV SOLN
INTRAVENOUS | Status: AC
  Administered 2022-04-01: 2 g via INTRAVENOUS
  Filled 2022-04-01: qty 50

## 2022-04-01 MED ORDER — IPRATROPIUM-ALBUTEROL 0.5-2.5 (3) MG/3ML IN SOLN
RESPIRATORY_TRACT | Status: AC
  Administered 2022-04-01: 3 mL via RESPIRATORY_TRACT
  Filled 2022-04-01: qty 3

## 2022-04-01 MED ORDER — MAGNESIUM SULFATE 2 GM/50ML IV SOLN: 2.0000 g | Freq: Once | INTRAVENOUS | Status: AC

## 2022-04-01 MED ORDER — POTASSIUM CHLORIDE CRYS ER 20 MEQ PO TBCR: 20.0000 meq | EXTENDED_RELEASE_TABLET | Freq: Every day | ORAL | 0 refills | Status: DC

## 2022-04-01 MED ORDER — MAGNESIUM SULFATE 50 % IJ SOLN: 2.0000 g | Freq: Once | INTRAMUSCULAR | Status: DC

## 2022-04-01 MED ORDER — POTASSIUM CHLORIDE CRYS ER 20 MEQ PO TBCR
40.0000 meq | EXTENDED_RELEASE_TABLET | Freq: Once | ORAL | Status: AC
  Administered 2022-04-01: 40 meq via ORAL
  Filled 2022-04-01: qty 2

## 2022-04-01 MED ORDER — IPRATROPIUM-ALBUTEROL 0.5-2.5 (3) MG/3ML IN SOLN: 3.0000 mL | Freq: Once | RESPIRATORY_TRACT | Status: AC

## 2022-04-01 MED ORDER — IPRATROPIUM-ALBUTEROL 0.5-2.5 (3) MG/3ML IN SOLN
3.0000 mL | RESPIRATORY_TRACT | Status: AC
  Administered 2022-04-01 (×2): 3 mL via RESPIRATORY_TRACT
  Filled 2022-04-01 (×2): qty 3

## 2022-04-01 MED ORDER — METHYLPREDNISOLONE SODIUM SUCC 125 MG IJ SOLR
125.0000 mg | Freq: Once | INTRAMUSCULAR | Status: AC
  Administered 2022-04-01: 125 mg via INTRAVENOUS
  Filled 2022-04-01: qty 2

## 2022-04-01 NOTE — ED Triage Notes (Addendum)
Patient was in hospital for CHF about a month ago. Family increased patient fluid pill. Patient with c/o shortness of breath, cough and abdominal pain from coughing.  Patient pulse ox was 87 on room air.

## 2022-04-01 NOTE — ED Provider Notes (Signed)
[facility] EMERGENCY DEPARTMENT Provider Note   CSN: [id number] Arrival date & time: 04/01/22  1049     History  Chief Complaint  Patient presents with   Shortness of Breath    William Villanueva is a 71 y.o. male.  71 year old male with a history of nonischemic cardiomyopathy EF of 40%, complete heart block status post pacemaker, polycythemia vera, hemochromatosis, and possible COPD on 2 L home oxygen who presents emergency department with cough and shortness of breath.  Started 1.5 months ago after his family members had similar symptoms.  Has had persistent runny nose and cough.  Says it is productive of white sputum.  Denies any chest pain.  Says that he has had mild lower extremity swelling as well.  No fevers.  Was hospitalized and discharged home on his prior dose of Lasix which she states is not significantly helping his swelling.      Home Medications Prior to Admission medications   Medication Sig Start Date End Date Taking? Authorizing Provider  benzonatate (TESSALON) 100 MG capsule Take 1 capsule (100 mg total) by mouth every 8 (eight) hours. 04/01/22  Yes Fransico Meadow, MD  dexamethasone (DECADRON) 4 MG tablet Take 3 tablets (12 mg total) by mouth daily for 1 day. 04/01/22 04/02/22 Yes Fransico Meadow, MD  doxycycline (VIBRAMYCIN) 100 MG capsule Take 1 capsule (100 mg total) by mouth 2 (two) times daily for 5 days. 04/01/22 04/06/22 Yes Fransico Meadow, MD  potassium chloride SA (KLOR-CON M) 20 MEQ tablet Take 1 tablet (20 mEq total) by mouth daily for 5 days. 04/01/22 04/06/22 Yes Fransico Meadow, MD  acetaminophen (TYLENOL) 500 MG tablet Take 1,000 mg by mouth every 6 (six) hours as needed for headache.    [provider]  allopurinol (ZYLOPRIM) 100 MG tablet Take 2 tablets (200 mg total) by mouth daily. Patient taking differently: Take 100 mg by mouth 2 (two) times daily. 03/03/22   Rice, Resa Miner, MD  budesonide (PULMICORT) 0.5  MG/2ML nebulizer solution Inhale one vial in nebulizer twice a day. **Rinse mouth after use. 03/03/22   Margaretha Seeds, MD  carvedilol (COREG) 3.125 MG tablet Take 1 tablet (3.125 mg total) by mouth 2 (two) times daily with a meal. 08/19/21   Deboraha Sprang, MD  cholestyramine Lucrezia Starch) 4 GM/DOSE powder Take 1 g by mouth daily as needed (diarrhea). 03/22/22   [provider]  citalopram (CELEXA) 10 MG tablet Take 5 mg by mouth daily. 03/01/22   [provider]  COVID-19 mRNA vaccine [id number] (COMIRNATY) syringe Inject into the muscle. 03/02/22   Carlyle Basques, MD  Cyanocobalamin (B-12 PO) Take 1 tablet by mouth daily.    [provider]  diclofenac Sodium (VOLTAREN) 1 % GEL Apply 2 g topically 4 (four) times daily. Apply to left shoulder for pain. Patient taking differently: Apply 2 g topically at bedtime as needed (Apply to left shoulder for pain.). 02/14/22   Angelique Blonder, DO  dicyclomine (BENTYL) 20 MG tablet Take 20 mg by mouth 2 (two) times daily. 03/28/22   [provider]  folic acid (FOLVITE) 1 MG tablet Take 1 mg by mouth daily.    [provider]  furosemide (LASIX) 40 MG tablet Take 2 tablets (80 mg total) by mouth daily. 04/01/22   Fransico Meadow, MD  guaiFENesin-dextromethorphan (ROBITUSSIN DM) 100-10 MG/5ML syrup Take 5 mLs by mouth every 6 (six) hours as needed for cough. 02/10/22   Arrien, Jimmy Picket, MD  hydroxyurea (HYDREA) 500 MG capsule Take 2 capsules (1,000 mg total) by mouth daily. May take with food to minimize GI side effects. Currently taking 500 mg daily but MD note says may need to increase on 12/27/2020 to twice daily. Patient taking differently: Take 500 mg by mouth 2 (two) times daily. May take with food to minimize GI side effects. 12/17/20   Volanda Napoleon, MD  influenza vaccine adjuvanted (FLUAD QUADRIVALENT) 0.5 ML injection Inject into the muscle. 03/02/22   Carlyle Basques, MD  insulin degludec (TRESIBA  FLEXTOUCH) 100 UNIT/ML SOPN FlexTouch Pen Inject 20 Units into the skin daily after breakfast.     [provider]  ipratropium-albuterol (DUONEB) 0.5-2.5 (3) MG/3ML SOLN Inhale one vial in nebulizer 3 times a day and every 6 hrs as needed. Patient taking differently: Take 3 mLs by nebulization 2 (two) times daily. 12/02/21   Margaretha Seeds, MD  ketoconazole (NIZORAL) 2 % cream Apply 1 Application topically daily as needed for irritation (Rash). 12/22/21   [provider]  losartan (COZAAR) 25 MG tablet Take 1 tablet (25 mg total) by mouth daily. Patient taking differently: Take 12.5 mg by mouth daily. 02/10/22 03/29/22  Arrien, Jimmy Picket, MD  Magnesium Oxide 400 MG CAPS Take 2 capsules (800 mg total) by mouth in the morning and at bedtime. Patient taking differently: Take 400 mg by mouth in the morning and at bedtime. 04/26/20   Deboraha Sprang, MD  omeprazole (PRILOSEC) 20 MG capsule Take 20 mg by mouth 2 (two) times daily before a meal.  07/05/16   [provider]  oxybutynin (DITROPAN XL) 15 MG 24 hr tablet Take 15 mg by mouth daily. 02/06/22   [provider]  rosuvastatin (CRESTOR) 5 MG tablet Take 5 mg by mouth daily.    [provider]  spironolactone (ALDACTONE) 25 MG tablet Take 0.5 tablets (12.5 mg total) by mouth daily. 02/12/22   Arrien, Jimmy Picket, MD  tamsulosin (FLOMAX) 0.4 MG CAPS capsule Take 0.4 mg by mouth 2 (two) times daily.  08/03/17   [provider]  Turmeric (QC TUMERIC COMPLEX) 500 MG CAPS Take 500 mg by mouth daily.    [provider]  zolpidem (AMBIEN) 5 MG tablet Take 1 tablet (5 mg total) by mouth at bedtime as needed for sleep. 02/10/22   Arrien, Jimmy Picket, MD      Allergies    Bupropion, Cephalexin, Ibuprofen, Prednisone, Temazepam, Trazodone and nefazodone, and Prozac [fluoxetine hcl]    Review of Systems   Review of Systems  Physical Exam Updated Vital Signs BP 113/71   Pulse 82    Temp 98 F (36.7 C)   Resp 20   Ht 5' 10"  (1.778 m)   Wt 109 kg   SpO2 96%   BMI 34.49 kg/m  Physical Exam Vitals and nursing note reviewed.  Constitutional:      General: He is not in acute distress.    Appearance: He is well-developed.     Comments: Chronically ill-appearing but not in acute distress.  Able to speak in full sentences.  HENT:     Head: Normocephalic and atraumatic.     Right Ear: External ear normal.     Left Ear: External ear normal.     Nose: Nose normal.  Eyes:     Extraocular Movements: Extraocular movements intact.     Conjunctiva/sclera: Conjunctivae normal.     Pupils: Pupils are equal, round, and reactive to light.  Cardiovascular:  Rate and Rhythm: Normal rate. Rhythm irregular.     Heart sounds: Normal heart sounds.  Pulmonary:     Effort: Pulmonary effort is normal. No respiratory distress.     Breath sounds: Wheezing, rhonchi and rales (Bibasilar) present.  Abdominal:     General: There is no distension.     Palpations: Abdomen is soft. There is no mass.     Tenderness: There is no abdominal tenderness. There is no guarding.  Musculoskeletal:        General: No swelling.     Cervical back: Normal range of motion and neck supple.     Right lower leg: Edema (1+) present.     Left lower leg: Edema (1+) present.  Skin:    General: Skin is warm and dry.     Capillary Refill: Capillary refill takes less than 2 seconds.  Neurological:     Mental Status: He is alert. Mental status is at baseline.  Psychiatric:        Mood and Affect: Mood normal.        Behavior: Behavior normal.     ED Results / Procedures / Treatments   Labs (all labs ordered are listed, but only abnormal results are displayed) Labs Reviewed  CBC - Abnormal; Notable for the following components:      Result Value   RBC 3.13 (*)    Hemoglobin 10.8 (*)    HCT 33.7 (*)    MCV 107.7 (*)    MCH 34.5 (*)    RDW 20.5 (*)    Platelets 539 (*)    All other components  within normal limits  BRAIN NATRIURETIC PEPTIDE - Abnormal; Notable for the following components:   B Natriuretic Peptide 602.9 (*)    All other components within normal limits  BASIC METABOLIC PANEL - Abnormal; Notable for the following components:   Potassium 3.3 (*)    Glucose, Bld 113 (*)    Creatinine, Ser 1.25 (*)    Calcium 7.0 (*)    All other components within normal limits  MAGNESIUM - Abnormal; Notable for the following components:   Magnesium 1.0 (*)    All other components within normal limits  TROPONIN I (HIGH SENSITIVITY) - Abnormal; Notable for the following components:   Troponin I (High Sensitivity) 32 (*)    All other components within normal limits  TROPONIN I (HIGH SENSITIVITY) - Abnormal; Notable for the following components:   Troponin I (High Sensitivity) 34 (*)    All other components within normal limits  RESP PANEL BY RT-PCR (FLU A&B, COVID) [id number]    EKG EKG Interpretation  Date/Time:  Saturday April 01 2022 11:42:59 EST Ventricular Rate:  100 PR Interval:  137 QRS Duration: 174 QT Interval:  431 QTC Calculation: 556 R Axis:   246 Text Interpretation: Sinus tachycardia Right bundle branch block Confirmed by Margaretmary Eddy [phone]) on 04/01/2022 12:02:06 PM  Radiology DG Chest 2 View  Result Date: 04/01/2022 CLINICAL DATA:  Shortness of breath.  History of CHF. EXAM: CHEST - 2 VIEW COMPARISON:  Chest radiograph 02/14/2022 FINDINGS: The left chest wall cardiac device and associated leads are stable. Cardiomegaly is unchanged. There is central vascular congestion with likely mild pulmonary interstitial edema. There is no focal consolidation. There is no pleural effusion. There is no pneumothorax There is no acute osseous abnormality. IMPRESSION: Cardiomegaly with vascular congestion and likely mild pulmonary interstitial edema. No focal consolidation or pleural effusion. Electronically Signed   By: Court Joy.D.  On: 04/01/2022 11:46     Procedures Procedures   Medications Ordered in ED Medications  ipratropium-albuterol (DUONEB) 0.5-2.5 (3) MG/3ML nebulizer solution 3 mL (3 mLs Nebulization Given 04/01/22 1144)  methylPREDNISolone sodium succinate (SOLU-MEDROL) 125 mg/2 mL injection 125 mg (125 mg Intravenous Given 04/01/22 1201)  furosemide (LASIX) injection 60 mg (60 mg Intravenous Given 04/01/22 1201)  ipratropium-albuterol (DUONEB) 0.5-2.5 (3) MG/3ML nebulizer solution 3 mL (3 mLs Nebulization Given 04/01/22 1220)  potassium chloride SA (KLOR-CON M) CR tablet 40 mEq (40 mEq Oral Given 04/01/22 1417)  magnesium sulfate IVPB 2 g 50 mL (0 g Intravenous Stopped 04/01/22 1527)    ED Course/ Medical Decision Making/ A&P Clinical Course as of 04/01/22 2000  Sat Apr 01, 2022  1250 B Natriuretic Peptide(!): 602.9 Mildly increased from prior [RP]  1251 Troponin I (High Sensitivity)(!): 32 Baseline 20 [RP]  1318 Called by lab and patient is RSV positive [RP]    Clinical Course User Index [RP] Fransico Meadow, MD                           Medical Decision Making Amount and/or Complexity of Data Reviewed Labs: ordered. Decision-making details documented in ED Course. Radiology: ordered.  Risk Prescription drug management.   William Villanueva is a 71 y.o. male with comorbidities that complicate the patient evaluation including nonischemic cardiomyopathy EF of 40%, complete heart block status post pacemaker, polycythemia vera, hemochromatosis, and possible COPD on 2 L home oxygen who presents emergency department with cough and shortness of breath.  Initial Ddx:  CHF, COPD, pneumonia, URI, MI, PE  MDM:  Feel the patient is likely having a heart failure exacerbation.  Do feel that there is also a component of COPD with his rhonchi and later reported a smoking history.  May have been incited by a URI from family members.  No fevers that would be suggestive of a pneumonia.  Considered MI and PE but feel these are  less likely given his current exam.  Plan:  Labs BNP Troponin Chest x-ray EKG DuoNeb Solu-Medrol Lasix  ED Summary/Re-evaluation:  Patient was reassessed and had good urine output with the Lasix.  Wheezing had improved with the nebulizer treatment feel that he is likely having a combined COPD and heart failure exacerbation.  He is on his home 2 L at this time.  Labs and chest x-ray were consistent with heart failure exacerbation.  Troponins elevated but flat and did not have concerning findings on his EKG.  Was called by the lab and he was noted to be RSV positive.  Says his breathing is much improved so feel that he is suitable for outpatient treatment.  Instructed to take 80 mg of Lasix daily along with 20 mill equivalents of oral potassium for the next 5 days.  Also given Decadron due to his adverse reaction to prednisone and instructed to take his inhaler at home along with doxycycline.  Instructed to follow-up with his primary doctor in 2 to 3 days regarding his symptoms.  This patient presents to the ED for concern of complaints listed in HPI, this involves an extensive number of treatment options, and is a complaint that carries with it a high risk of complications and morbidity. Disposition including potential need for admission considered.   Dispo: DC Home. Return precautions discussed including, but not limited to, those listed in the AVS. Allowed pt time to ask questions which were answered fully prior to dc.  Additional history obtained from spouse Records reviewed Outpatient Clinic Notes The following labs were independently interpreted: CBC and show chronic anemia I independently reviewed the following imaging with scope of interpretation limited to determining acute life threatening conditions related to emergency care: Chest x-ray, which revealed  pulmonary edema   I personally reviewed and interpreted the pt's EKG: see above for interpretation  I have reviewed the patients  home medications and made adjustments as needed  Final Clinical Impression(s) / ED Diagnoses Final diagnoses:  Acute on chronic congestive heart failure, unspecified heart failure type ([place])  RSV (acute bronchiolitis due to respiratory syncytial virus)  Wheezing    Rx / DC Orders ED Discharge Orders          Ordered    benzonatate (TESSALON) 100 MG capsule  Every 8 hours        04/01/22 1438    doxycycline (VIBRAMYCIN) 100 MG capsule  2 times daily        04/01/22 1438    dexamethasone (DECADRON) 4 MG tablet  Daily        04/01/22 1438    furosemide (LASIX) 40 MG tablet  Daily        04/01/22 1438    potassium chloride SA (KLOR-CON M) 20 MEQ tablet  Daily        04/01/22 1438              Fransico Meadow, MD 04/01/22 2000

## 2022-04-01 NOTE — Discharge Instructions (Addendum)
You were seen in the emergency department for your shortness of breath.  You were having a heart failure exacerbation and were also diagnosed with RSV.  Please take 80 mg of oral Lasix per day for the next 5 days along with 20 mill equivalents of potassium while on the increased dose of lasix.  Take Decadron tomorrow as well as your inhaler as needed.  Take the doxycycline for 5 days to help with lung inflammation.  Take tea with honey and Tessalon Perles for your cough.  Follow-up with your primary doctor in 2 to 3 days.  Return to the emergency department if you experience any of the following: Worsening shortness of breath, chest pain, or any other concerning symptoms.

## 2022-04-01 NOTE — ED Notes (Signed)
ED Provider at bedside. 

## 2022-04-03 ENCOUNTER — Encounter (HOSPITAL_COMMUNITY): Admission: RE | Payer: Self-pay | Source: Home / Self Care

## 2022-04-03 ENCOUNTER — Ambulatory Visit (HOSPITAL_COMMUNITY): Admission: RE | Admit: 2022-04-03 | Payer: Medicare Other | Source: Home / Self Care | Admitting: Internal Medicine

## 2022-04-03 SURGERY — BIV PACEMAKER GENERATOR CHANGEOUT

## 2022-04-06 ENCOUNTER — Ambulatory Visit: Payer: Medicare Other | Admitting: Internal Medicine

## 2022-04-06 ENCOUNTER — Encounter: Payer: Self-pay | Admitting: Internal Medicine

## 2022-04-06 ENCOUNTER — Other Ambulatory Visit (HOSPITAL_BASED_OUTPATIENT_CLINIC_OR_DEPARTMENT_OTHER): Payer: Self-pay

## 2022-04-06 ENCOUNTER — Other Ambulatory Visit: Payer: Self-pay | Admitting: *Deleted

## 2022-04-06 VITALS — BP 124/60 | HR 94 | Temp 98.0°F | Ht 70.0 in | Wt 244.0 lb

## 2022-04-06 DIAGNOSIS — R0609 Other forms of dyspnea: Secondary | ICD-10-CM | POA: Diagnosis not present

## 2022-04-06 DIAGNOSIS — D45 Polycythemia vera: Secondary | ICD-10-CM

## 2022-04-06 DIAGNOSIS — J45901 Unspecified asthma with (acute) exacerbation: Secondary | ICD-10-CM

## 2022-04-06 DIAGNOSIS — G4734 Idiopathic sleep related nonobstructive alveolar hypoventilation: Secondary | ICD-10-CM

## 2022-04-06 DIAGNOSIS — R0602 Shortness of breath: Secondary | ICD-10-CM | POA: Diagnosis not present

## 2022-04-06 MED ORDER — ACETAMINOPHEN-CODEINE 300-30 MG PO TABS: 1.0000 | ORAL_TABLET | ORAL | 0 refills | Status: DC | PRN

## 2022-04-06 MED ORDER — HYDROXYUREA 500 MG PO CAPS: 1000.0000 mg | ORAL_CAPSULE | Freq: Every day | ORAL | 3 refills | Status: DC

## 2022-04-06 MED ORDER — METHYLPREDNISOLONE ACETATE 80 MG/ML IJ SUSP
120.0000 mg | Freq: Once | INTRAMUSCULAR | Status: AC
  Administered 2022-04-06: 120 mg via INTRAMUSCULAR

## 2022-04-06 MED ORDER — DOXYCYCLINE HYCLATE 100 MG PO TABS: 100.0000 mg | ORAL_TABLET | Freq: Two times a day (BID) | ORAL | 0 refills | Status: DC

## 2022-04-06 MED ORDER — FAMOTIDINE 20 MG PO TABS: ORAL_TABLET | ORAL | 11 refills | Status: DC

## 2022-04-06 MED ORDER — PANTOPRAZOLE SODIUM 40 MG PO TBEC: 40.0000 mg | DELAYED_RELEASE_TABLET | Freq: Every day | ORAL | 2 refills | Status: DC

## 2022-04-06 NOTE — Progress Notes (Signed)
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 Hurting in chest but  breathing ok s inhalers needed until gradually worsened since Dec 2015 referred by William Villanueva for unexplained sob p admit but pfts s airflow obst 08/28/2014    Admit date: 06/21/2014 Discharge date: 06/23/2014  Primary Care Provider: Antony Contras Villanueva Primary Cardiologist: Dr. Kirk Villanueva  Discharge Diagnoses Principal Problem:   Chest tightness Active Problems:   Polycythemia vera   Back pain   Tobacco use   Congestive dilated cardiomyopathy   Pacemaker   Dyspnea     Procedures  Echocardiogram 06/22/2014 - Left ventricle: The cavity size was mildly dilated. Wall   thickness was normal. Systolic function was normal. The estimated   ejection fraction was in the range of 50% to 55%. Doppler   parameters are consistent with abnormal left ventricular   relaxation (grade 1 diastolic dysfunction). - Left atrium: The atrium was moderately dilated. - Pulmonary arteries: PA peak pressure: 38 mm Hg (S).  Impressions:  - Poor acoustic windows, even with echo contrast, limit study      2 day stress test 06/23/2014 IMPRESSION: 1. No reversible ischemia or infarction.   2. Normal left ventricular wall motion.   3. Left ventricular ejection fraction 35%   4. Intermediate-risk stress test findings* secondary to reduced ejection fraction of 35%. Otherwise there is no ischemia identified. Personally reviewed echocardiogram which appears to have reduced ejection fraction in the 40% range.     Villanueva Course  The patient is a 71 year old male with past medical history of polycythemia vera, hypertension, history of nonischemic cardiomyopathy with baseline EF 45%. His last cardiac catheterization in 2013 showed mild nonobstructive CAD. He had  placement of biventricular pacemaker in December 2015. Patient presented to William Villanueva on 06/21/2014. According to him, ever since pacemaker placement, he has been having shortness breath and episodic chest discomfort. He was admitted to cardiology service, serial troponin when has been negative. CTA was negative for PE or pneumothorax, was noted he had a small amount of pericardial fluid, however and no active pulmonary disease. Echocardiogram was obtained on 06/22/2014 which showed EF 12-19%, grade 1 diastolic dysfunction, PA peak pressure 38. No pericardial effusion was noted on echocardiogram. There was no obvious sign of microperforation after recently pacemaker placement. Given his persistent symptoms, 2 day lexiscan stress test was obtained which came back on 06/23/2014. The stress test shows EF 35%, normal left ventricular wall motion, no reversible ischemia or infarction.   Patient was seen in the morning of 2/16, at which time he denies any obvious chest pain. He is deemed stable for discharge from cardiology perspective. During this admission, his carvedilol was increased to 25 mg twice a day for Villanueva rate control. Imdur was added to help with chest pain. Patient will need a outpatient sleep study which her wife has already scheduled for him at the beginning of March.         07/23/2014 1st [name] office visit/ William Villanueva  / on lisinopril 5 mg daily  Chief Complaint  Patient presents with   Pulmonary Consult    Referred by William Villanueva. Pt c/o SOB and cough since Dec 2015. He states that he wakes up in the night gasping for air and  feels SOB occ during the day. He is coughing up large amounts of sputum- normally clear, but yellow for the past 2 days.   since Feb 6th some Villanueva   ?  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 Pt states that his cough has resolved. His breathing has started to improve. He was started on CPAP approx 1 wk ago per William Villanueva in [place].   Still sob "across a parking lot" Rec No copd/just obesity /deconditioning so f/u prn        12/12/2021 Acute ov/William Villanueva re: DOE  maint on [employer]  Chief Complaint  Patient presents with   Acute Visit    Increased DOE.  He states about a month ago took a capsule and "it got caught in my windpipe"- coughed up a capsule a few days later. He was winded today walking from lobby to closest exam room. He has noticed some scrabs forming in his nose and nasal congestion.   Dyspnea:  100 ft slow pace, flat level  Cough: Villanueva now but still has sense of globus  Sleeping: bed is flat/ R side down / one pillow SABA use: maint bid neb does not take extras 02: none  Covid status:   vax x 4  Rec Change prilosec Take 30- 60 min before your first and last meals of the day  GERD diet reviewed, bed blocks rec  We will call for ENT evaluation in [facility] ASAP > problem resolved when stopped pill for diarhhea      04/06/2022  f/u ov/William Villanueva re: doe /AB suspected   maint on duoneb/pulmocort  Chief Complaint  Patient presents with   Follow-up    Had RSV 1 mth. Ago, cough-white, green, yellow, sob, no fever  Was fine until around  GC's 3 weeks prior to OV  > dx RSV  11/25 doxy  Dyspnea:  walking Villanueva  Cough: only a little Villanueva, much worse at hs less dark since 5 days of doxy Sleeping: flat bed  SABA use: just bid duoneb 02: 2lpm hs and prn      No obvious day to day or daytime variability or assoc  mucus plugs or hemoptysis or cp or chest tightness, subjective wheeze or   hb symptoms.     Also denies any obvious fluctuation of  symptoms with weather or environmental changes or other aggravating or alleviating factors except as outlined above   No unusual exposure hx or h/o childhood pna/ asthma or knowledge of premature birth.  Current Allergies, Complete Past Medical History, Past Surgical History, Family History, and Social History were reviewed in [employer] record.  ROS  The following are not active complaints unless bolded Hoarseness, sore throat, dysphagia, dental problems, itching, sneezing,  nasal congestion or discharge of excess mucus or purulent secretions, ear ache,   fever, chills, sweats, unintended wt loss or wt gain, classically pleuritic or exertional cp,  orthopnea pnd or arm/hand swelling  or leg swelling, presyncope, palpitations, abdominal pain, anorexia, nausea, vomiting,  diarrhea  or change in bowel habits or change in bladder habits, change in stools or change in urine, dysuria, hematuria,  rash, arthralgias, visual complaints, headache, numbness, weakness or ataxia or problems with walking or coordination,  change in mood or  memory.        Current Meds  Medication Sig   acetaminophen (TYLENOL) 500 MG tablet Take 1,000 mg by mouth every 6 (six) hours as needed for headache.   allopurinol (ZYLOPRIM) 100 MG tablet Take 2 tablets (200 mg total) by mouth daily. (Patient taking differently: Take 100 mg by mouth 2 (two) times daily.)   benzonatate (TESSALON) 100 MG capsule Take 1 capsule (100 mg total) by mouth every 8 (eight) hours.   budesonide (PULMICORT) 0.5 MG/2ML nebulizer solution Inhale one vial in nebulizer twice a day. **Rinse mouth after use.   carvedilol (COREG) 3.125 MG tablet Take 1 tablet (3.125 mg total) by mouth 2 (two) times daily with a meal.   cholestyramine (QUESTRAN) 4 GM/DOSE powder Take 1 g by mouth daily as needed (diarrhea).   citalopram (CELEXA) 10 MG tablet Take 5 mg by mouth daily.   Cyanocobalamin (B-12 PO) Take 1 tablet by mouth daily.    diclofenac Sodium (VOLTAREN) 1 % GEL Apply 2 g topically 4 (four) times daily. Apply to left shoulder for pain. (Patient taking differently: Apply 2 g topically at bedtime as needed (Apply to left shoulder for pain.).)   dicyclomine (BENTYL) 20 MG tablet Take 20 mg by mouth 2 (two) times daily.   doxycycline (VIBRAMYCIN) 100 MG capsule Take 1 capsule (100 mg total) by mouth 2 (two) times daily for 5 days.   folic acid (FOLVITE) 1 MG tablet Take 1 mg by mouth daily.   furosemide (LASIX) 40 MG tablet Take 2 tablets (80 mg total) by mouth daily.   guaiFENesin-dextromethorphan (ROBITUSSIN DM) 100-10 MG/5ML syrup Take 5 mLs by mouth every 6 (six) hours as needed for cough.   hydroxyurea (HYDREA) 500 MG capsule Take 2 capsules (1,000 mg total) by mouth daily. May take with food to minimize GI side effects. Currently taking 500 mg daily but MD note says may need to increase on 12/27/2020 to twice daily. (Patient taking differently: Take 500 mg by mouth 2 (two) times daily. May take with food to minimize GI side effects.)   insulin degludec (TRESIBA FLEXTOUCH) 100 UNIT/ML SOPN FlexTouch Pen Inject 20 Units into the skin daily after breakfast.    ipratropium-albuterol (DUONEB) 0.5-2.5 (3) MG/3ML SOLN Inhale one vial in nebulizer 3 times a day and every 6 hrs as needed. (Patient taking differently: Take 3 mLs by nebulization 2 (two) times daily.)   ketoconazole (NIZORAL) 2 % cream Apply 1 Application topically daily as needed for irritation (Rash).   Magnesium Oxide 400 MG CAPS Take 2 capsules (800 mg total) by mouth in the morning and at bedtime. (Patient taking differently: Take 400 mg by mouth in the morning and at bedtime.)   omeprazole (PRILOSEC) 20 MG capsule Take 20 mg by mouth 2 (two) times daily before a meal.    oxybutynin (DITROPAN XL) 15 MG 24 hr tablet Take 15 mg by mouth daily.   potassium chloride SA (KLOR-CON M) 20 MEQ tablet Take 1 tablet (20 mEq total) by mouth daily for 5 days.   rosuvastatin  (CRESTOR) 5 MG tablet Take 5 mg by mouth daily.   spironolactone (ALDACTONE) 25 MG tablet Take 0.5 tablets (12.5 mg total) by mouth daily.   tamsulosin (FLOMAX) 0.4 MG  CAPS capsule Take 0.4 mg by mouth 2 (two) times daily.    Turmeric (QC TUMERIC COMPLEX) 500 MG CAPS Take 500 mg by mouth daily.   zolpidem (AMBIEN) 5 MG tablet Take 1 tablet (5 mg total) by mouth at bedtime as needed for sleep.          HEENT : Oropharynx  clear  Nasal turbinates mod non-specific edema   NECK :  without  apparent JVD/ palpable Nodes/TM    LUNGS: no acc muscle use,  Min barrel  contour chest wall with bilateral insp exp rhonchi  and  without cough on insp or exp maneuvers and min  Hyperresonant  to  percussion bilaterally    CV:  RRR  no s3 or murmur or increase in P2, and no edema   ABD:  soft and nontender with pos end  insp Hoover's  in the supine position.  No bruits or organomegaly appreciated   MS:  Nl gait/ ext warm without deformities Or obvious joint restrictions  calf tenderness, cyanosis or clubbing     SKIN: warm and dry without lesions    NEURO:  alert, approp, nl sensorium with  no motor or cerebellar deficits apparent.         I personally reviewed images and agree with radiology impression as follows:  CXR:   04/01/22 Cardiomegaly with vascular congestion and likely mild pulmonary interstitial edema. No focal consolidation or pleural effusion.       Objective:   Physical Exam   wts  04/06/2022      244  12/12/2021          265  08/28/2014        277     07/23/14 268 lb 6.4 oz (121.745 kg)  06/21/14 280 lb 3.3 oz (127.1 kg)  06/17/14 278 lb (126.1 kg)     Vital signs reviewed  04/06/2022  - Note at rest 02 sats  90% on RA   General appearance:    amb wm with cane congested cough  / nasal tone to voice       Assessment:

## 2022-04-06 NOTE — Patient Instructions (Addendum)
For cough > Mucinex dm 1200 mg every 12 hours and if still can't stop :  tylenol 3# up to every  4 hours and / or benzoate every 6 hours if can't afford to be dowsy  For Breathing > add extra duoneb at lunch and bedtime  Depomedrol 120 mg IM   Stop prilosec:  Pantoprazole (protonix) 40 mg   Take  30-60 min before first meal of the day and Pepcid (famotidine)  20 mg after supper until return to office - this is the best way to tell whether stomach acid is contributing to your problem.    GERD (REFLUX)  is an extremely common cause of respiratory symptoms just like yours , many times with no obvious heartburn at all.    It can be treated with medication, but also with lifestyle changes including elevation of the head of your bed (ideally with 6 -8inch blocks under the headboard of your bed),  Smoking cessation, avoidance of late meals, excessive alcohol, and avoid fatty foods, chocolate, peppermint, colas, red wine, and acidic juices such as orange juice.  NO MINT OR MENTHOL PRODUCTS SO NO COUGH DROPS  USE SUGARLESS CANDY INSTEAD (Jolley ranchers or Stover's or Life Savers) or even ice chips will also do - the key is to swallow to prevent all throat clearing. NO OIL BASED VITAMINS - use powdered substitutes.  Avoid fish oil when coughing.   Please schedule a follow up office visit in 6 weeks, call sooner if needed   Late add : finish another 7 d doxy as muliple drug intol/allergies listed and doing fine with doxy but not clear yet

## 2022-04-07 ENCOUNTER — Ambulatory Visit: Payer: Medicare Other | Attending: Internal Medicine | Admitting: Internal Medicine

## 2022-04-07 ENCOUNTER — Other Ambulatory Visit: Payer: Self-pay

## 2022-04-07 ENCOUNTER — Encounter: Payer: Self-pay | Admitting: Internal Medicine

## 2022-04-07 ENCOUNTER — Telehealth: Payer: Self-pay | Admitting: Internal Medicine

## 2022-04-07 ENCOUNTER — Other Ambulatory Visit (HOSPITAL_BASED_OUTPATIENT_CLINIC_OR_DEPARTMENT_OTHER): Payer: Self-pay

## 2022-04-07 VITALS — BP 112/68 | HR 89 | Resp 20 | Ht 70.0 in | Wt 244.0 lb

## 2022-04-07 DIAGNOSIS — M1A09X0 Idiopathic chronic gout, multiple sites, without tophus (tophi): Secondary | ICD-10-CM

## 2022-04-07 DIAGNOSIS — M159 Polyosteoarthritis, unspecified: Secondary | ICD-10-CM

## 2022-04-07 DIAGNOSIS — J45901 Unspecified asthma with (acute) exacerbation: Secondary | ICD-10-CM | POA: Insufficient documentation

## 2022-04-07 DIAGNOSIS — M069 Rheumatoid arthritis, unspecified: Secondary | ICD-10-CM | POA: Diagnosis not present

## 2022-04-07 MED ORDER — LIDOCAINE HCL 1 % IJ SOLN
0.5000 mL | INTRAMUSCULAR | Status: AC | PRN
  Administered 2022-04-07: .5 mL

## 2022-04-07 MED ORDER — LIDOCAINE HCL 1 % IJ SOLN
3.0000 mL | INTRAMUSCULAR | Status: AC | PRN
  Administered 2022-04-07: 3 mL

## 2022-04-07 MED ORDER — ACETAMINOPHEN-CODEINE 300-30 MG PO TABS
1.0000 | ORAL_TABLET | ORAL | 0 refills | Status: AC | PRN
  Filled 2022-04-07: qty 30, 5d supply, fill #0

## 2022-04-07 MED ORDER — TRIAMCINOLONE ACETONIDE 40 MG/ML IJ SUSP
40.0000 mg | INTRAMUSCULAR | Status: AC | PRN
  Administered 2022-04-07: 40 mg via INTRA_ARTICULAR

## 2022-04-07 MED ORDER — TRIAMCINOLONE ACETONIDE 40 MG/ML IJ SUSP
20.0000 mg | INTRAMUSCULAR | Status: AC | PRN
  Administered 2022-04-07: 20 mg via INTRA_ARTICULAR

## 2022-04-07 NOTE — Assessment & Plan Note (Signed)
Evaluated  in Pulmonary clinic/ [facility]/ William Villanueva w/u completed 08/28/14  - off acei 07/23/2014 > wheezing resolved - PFT's 08/28/14 mild restrictive changes with nl dlco p correction for alv vol  - 08/28/2014   Walked RA x 3 laps @ 170f each  stopped due to  Back pain, nl pace, no desat or sob  - 12/12/2021 recurrent sense of globus with pseudowheeze on exam > referred back to ENT/ max rx for gerd empirically  - 04/06/2022  After extensive coaching inhaler device,  effectiveness =    75%   DDX of  difficult airways management almost all start with A and  include Adherence, Ace Inhibitors, Acid Reflux, Active Sinus Disease, Alpha 1 Antitripsin deficiency, Anxiety masquerading as Airways dz,  ABPA,  Allergy(esp in young), Aspiration (esp in elderly), Adverse effects of meds,  Active smoking or vaping, A bunch of PE's (a small clot burden can't cause this syndrome unless there is already severe underlying pulm or vascular dz with poor reserve) plus two Bs  = Bronchiectasis and Beta blocker use..and one C= CHF  Adherence is always the initial "prime suspect" and is a multilayered concern that requires a "trust but verify" approach in every patient - starting with knowing how to use medications, especially inhalers, correctly, keeping up with refills and understanding the fundamental difference between maintenance and prns vs those medications only taken for a very short course and then stopped and not refilled.  -see hfa teaching - return with all meds in hand using a trust but verify approach to confirm accurate Medication  Reconciliation The principal here is that until we are certain that the  patients are doing what we've asked, it makes no sense to ask them to do more.   ? Acid (or non-acid) GERD > always difficult to exclude as up to 75% of pts in some series report no assoc GI/ Heartburn symptoms> rec max (24h)  acid suppression and diet restrictions/ reviewed and instructions given in writing.   -  Of the three most common causes of  Sub-acute / recurrent or chronic cough, only one (GERD)  can actually contribute to/ trigger  the other two (asthma and post nasal drip syndrome)  and perpetuate the cylce of cough.  While not intuitively obvious, many patients with chronic low grade reflux do not cough until there is a primary insult that disturbs the protective epithelial barrier and exposes sensitive nerve endings.   This is typically viral but can due to PNDS and  either may apply here.   The point is that once this occurs, it is difficult to eliminate the cycle  using anything but a maximally effective acid suppression regimen at least in the short run, accompanied by an appropriate diet to address non acid GERD and control / eliminate the cough itself for at least 3 days with tylenol #3   ? Allergy component > depomedrol 120 mg IM   ? Active sinusitis > complete another 7 d doxy   Return in 6 weeks, call sooner if needed

## 2022-04-07 NOTE — Assessment & Plan Note (Signed)
Evaluated  in Pulmonary clinic/ [facility]/ Lelar Farewell w/u completed 08/28/14  - off acei 07/23/2014 > wheezing resolved - PFT's 08/28/14 mild restrictive changes with nl dlco p correction for alv vol  - 08/28/2014   Walked RA x 3 laps @ 114f each  stopped due to  Back pain, nl pace, no desat or sob  - 12/12/2021 recurrent sense of globus with pseudowheeze on exam > referred back to ENT/ max rx for gerd empirically  - 04/06/2022  After extensive coaching inhaler device,  effectiveness =    75%

## 2022-04-07 NOTE — Telephone Encounter (Signed)
Ok will do.

## 2022-04-07 NOTE — Telephone Encounter (Signed)
Pt called the office stating that Dr. Melvyn Novas sent his Rx for Tylenol #3 to [facility] but they were out of it. He stated that [facility] has #60 tabs and he is needing Rx to be sent there instead.  Dr. Melvyn Novas, please advise on this for pt.

## 2022-04-07 NOTE — Assessment & Plan Note (Addendum)
ONO RA  12/20/21  desat < 89% x 5h 49 min  So 12/30/2021 rec 2lpm and repeat ono on 2lpm   Borderline sats with flare of AB > rec continue 2lpm noct   Make sure you check your oxygen saturation  AT  your highest level of activity (not after you stop)   to be sure it stays over 90% and adjust  02 flow upward to maintain this level if needed but remember to turn it back to previous settings when you stop (to conserve your supply).           Each maintenance medication was reviewed in detail including emphasizing most importantly the difference between maintenance and prns and under what circumstances the prns are to be triggered using an action plan format where appropriate.  Total time for H and P, chart review, counseling, reviewing hfa/02 device(s) and generating customized AVS unique to this office visit / same day charting > 30 min

## 2022-04-07 NOTE — Telephone Encounter (Signed)
Called and spoke to patient and updated him that Dr Melvyn Novas sent in medication as requested. Nothing further needed

## 2022-04-07 NOTE — Progress Notes (Signed)
Office Visit Note  Patient: William Villanueva             Date of Birth: 12/14/1950           MRN: [id number]             PCP: Antony Contras, MD Referring: Antony Contras, MD Visit Date: 04/07/2022   Subjective:  Follow-up (No improvement)   History of Present Illness: William Villanueva is a 71 y.o. male here for follow up for rheumatoid arthritis and gout currently on allopurinol increased to 200 mg daily after our initial visit. He continues having joint pain and stiffness at multiple areas. Most problematic are his right thumb and his left shoulder. Swelling stays mostly at his feet and ankles. He got sick with RSV and delayed his planned pacemaker battery replacement so that is coming up. Still has a bit of increased cough mostly nonproductive.   Previous HPI 02/23/22 William Villanueva is a 71 y.o. male here for rheumatoid arthritis. He was previously seeing [facility] rheumatology on treatment with methotrexate and had been on allopurinol and uloric for gout. He has history of carpal tunnel syndrome with surgical treatment for the left wrist.  Original diagnosis was RA he had joint pain in multiple areas with abnormal laboratory test.  He was started on injectable methotrexate but switched to oral methotrexate due to pain and difficulty with the injections.  He stopped following up with her clinic due to dissatisfaction including never seeing his MD and  not addressing any complaints not directly attributable to his inflammatory arthritis.  As result he has been off any disease specific medication for over a year does feel he has increased in joint pains off of treatment.  He had also been off of the Uloric they were prescribing but was restarted on allopurinol in the past few months with his PCP office.  He reports 1 flare of gout in his foot within the past month but before that had been free of any attacks for several months duration.  He still has residual swelling and erythema in the left  foot but was told this may be coming from his heart failure edema as well.   Generally has daily joint pain in multiple areas particular including bilateral hands and in his back.  He gets foot and ankle pain most severe during gout flares has mild symptoms otherwise.  More recently also developed increased left shoulder pain for several days duration for which he went to urgent care for evaluation of this but ended up going to the hospital due to hypoxia and increased cough so the original problem was never addressed.  Treatment course at the hospital for congestive heart failure exacerbation and improved after several days.  He still had increased cough producing significant discolored sputum and received 1 dose of IV ceftriaxone.  X-ray of the shoulder was obtained demonstrated mild glenohumeral joint osteoarthritis and moderate AC joint osteoarthritis.   Labs reviewed 10/2021 Uric acid 9.7 eGFR 59   Review of Systems  Constitutional:  Positive for fatigue.  HENT:  Positive for mouth dryness. Negative for mouth sores.   Eyes:  Positive for dryness.  Respiratory:  Positive for shortness of breath.   Cardiovascular:  Negative for chest pain and palpitations.  Gastrointestinal:  Positive for diarrhea. Negative for blood in stool and constipation.  Endocrine: Negative for increased urination.  Genitourinary:  Negative for involuntary urination.  Musculoskeletal:  Positive for joint pain, joint pain and morning stiffness. Negative for gait  problem, joint swelling, myalgias, muscle weakness, muscle tenderness and myalgias.  Skin:  Negative for color change, rash, hair loss and sensitivity to sunlight.  Allergic/Immunologic: Negative for susceptible to infections.  Neurological:  Positive for headaches. Negative for dizziness.  Hematological:  Negative for swollen glands.  Psychiatric/Behavioral:  Positive for sleep disturbance. Negative for depressed mood. The patient is not nervous/anxious.      PMFS History:  Patient Active Problem List   Diagnosis Date Noted   Asthmatic bronchitis with acute exacerbation 04/07/2022   Rheumatoid arthritis ([place]) 02/23/2022   High risk medication use 02/23/2022   COPD (chronic obstructive pulmonary disease) ([place]) 02/10/2022   Acute kidney injury superimposed on chronic kidney disease ([place]) 02/08/2022   Class 2 obesity 02/08/2022   Acute on chronic combined systolic (congestive) and diastolic (congestive) heart failure ([place]) 02/04/2022   DM2 (diabetes mellitus, type 2) ([place]) 02/04/2022   Acute on chronic combined systolic and diastolic CHF (congestive heart failure) ([place]) 02/04/2022   Nocturnal hypoxemia 12/30/2021   Epistaxis 12/12/2021   Non-ischemic cardiomyopathy ([place]) 07/29/2020   Hemorrhagic shock ([place]) 02/18/2020   Acute blood loss anemia 02/18/2020   Postural dizziness with presyncope 02/18/2020   Ulcerative colitis, acute, unspecified complication ([place]) 28/41/3244   Diarrhea 02/12/2020   Generalized abdominal pain 02/12/2020   Thrombocytosis 02/12/2020   Sprain of left wrist 08/12/2019   CHF (congestive heart failure) ([place]) 07/17/2017   COPD with acute exacerbation ([place]) 06/14/2017   Hyponatremia 05/27/2016   Hypokalemia 05/27/2016   Depression 04/05/2016   Kidney stones 04/05/2016   Osteoarthritis 04/05/2016   OSA (obstructive sleep apnea) 04/05/2016   Bilateral carpal tunnel syndrome 12/02/2015   Chronic combined systolic and diastolic congestive heart failure ([place]) 04/13/2015   Orthostatic hypotension 01/19/2015   Obesity 11/26/2014   Enlarged prostate without lower urinary tract symptoms (luts) 10/29/2014   Essential hypertension 10/29/2014   GERD (gastroesophageal reflux disease) 10/29/2014   Panic attack 10/29/2014   Changing skin lesion 10/16/2014   Renal cyst 09/02/2014   Pacemaker lead malfunction-elevated threshold RV lead 07/31/2014   Dyspnea on exertion 06/21/2014   Gastroenteritis, acute 06/15/2014    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   Palpitations 06/11/2014   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   Other cardiomyopathies ([place]) 04/29/2014   Bradycardia 04/28/2014   Chronic pain 02/12/2014   Thrombocythemia 12/01/2013   Muscular wasting and disuse atrophy 08/15/2013   Lumbar and sacral osteoarthritis 05/19/2013   Congestive dilated cardiomyopathy ([place]) 01/11/2012   Compulsive tobacco user syndrome 12/13/2011   Current tobacco use 12/13/2011   Chest tightness    Rash    Back pain    Hemochromatosis    SOB (shortness of breath)    Gout    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 Jude Allure Quadra pulse generator [id number], model PM 3242   Back pain    Cardiomyopathy (HCC)    Nonischemic 45%.    CHF (congestive heart failure) ([place])    Gout    Hemochromatosis    Hypertension    Hypospadias [birth year]   born with   Nephrolithiasis    Polycythemia vera(238.4)     Family History  Problem Relation Age of Onset   Stroke Mother    Other Father        Deceased, car fell on him   Diabetes Sister    Hypertension Sister    Heart disease Maternal Grandmother  Pacemaker, [place]   Past Surgical History:  Procedure Laterality Date   ANKLE SURGERY     BI-VENTRICULAR PACEMAKER INSERTION N/A 04/29/2014   Procedure: BI-VENTRICULAR PACEMAKER INSERTION (CRT-P);  Surgeon: Deboraha Sprang, MD; Laterality: Left  St. Jude Allure Quadra pulse generator [id number], model [place] 3242   CARDIAC SURGERY     COLONOSCOPY N/A 02/15/2020   Procedure: COLONOSCOPY;  Surgeon: Ronnette Juniper, MD;  Location: [facility];  Service: Gastroenterology;  Laterality: N/A;   ESOPHAGOGASTRODUODENOSCOPY (EGD) WITH PROPOFOL N/A 02/15/2020   Procedure: ESOPHAGOGASTRODUODENOSCOPY (EGD) WITH PROPOFOL;  Surgeon: Ronnette Juniper, MD;  Location: [facility];  Service: Gastroenterology;  Laterality: N/A;   PILONIDAL CYST EXCISION     POSTERIOR LAMINECTOMY  / DECOMPRESSION LUMBAR SPINE     TONSILLECTOMY     Social History   Social History Narrative   Lives at home with wife in a one story home.  Has no children.  Does not work.  Getting workman's comp.  Education: 4 years trade school.    Immunization History  Administered Date(s) Administered   COVID-19, mRNA, vaccine(Comirnaty)12 years and older 03/02/2022   DTaP 01/26/2016   Fluad Quad(high Dose 65+) 03/07/2021, 03/02/2022   Influenza Split 01/21/2015   Influenza, High Dose Seasonal PF 02/23/2017, 03/22/2018, 01/10/2019, 03/01/2020   Influenza,inj,Quad PF,6+ Mos 01/21/2015, 01/27/2016, 02/05/2017   Influenza-Unspecified 01/26/2016   PFIZER Comirnaty(Gray Top)Covid-19 Tri-Sucrose Vaccine 09/10/2020   PFIZER(Purple Top)SARS-COV-2 Vaccination 06/21/2019, 07/13/2019, 04/16/2020, 09/10/2020   Pfizer Covid-19 Vaccine Bivalent Booster 63yr & up 03/07/2021   Pneumococcal Conjugate-13 01/27/2016, 01/09/2018   Pneumococcal Polysaccharide-23 01/21/2015   Pneumococcal-Unspecified 01/26/2016   Tdap 01/26/2011, 01/27/2016, 08/09/2019   Zoster, Live 08/29/2019     Objective: Vital Signs: BP 112/68 (BP Location: Left Arm, Patient Position: Sitting, Cuff Size: Normal)   Pulse 89   Resp 20   Ht _0  (1.778 m)   Wt 244 lb (110.7 kg)   BMI 35.01 kg/m    Physical Exam Constitutional:      Appearance: He is obese.  Cardiovascular:     Rate and Rhythm: Normal rate and regular rhythm.  Pulmonary:     Effort: Pulmonary effort is normal.     Comments: Coughing with deep inspiration Musculoskeletal:     Comments: 1+ pedal edema above ankles  Skin:    General: Skin is warm and dry.  Neurological:     Mental Status: He is alert.  Psychiatric:        Mood and Affect: Mood normal.      Musculoskeletal Exam:  Shoulders slightly restricted abduction ROM on left with tenderness on superior and posterior sides Elbows full ROM no tenderness or swelling Wrists full ROM no tenderness or  swelling Fingers full ROM heberdon's nodes on both hands, right 1st CMC joint squaring and significant wasting of thenar emminence Knees full ROM no tenderness or swelling crepitus present b/l   Investigation: No additional findings.  Imaging: DG Chest 2 View  Result Date: 04/01/2022 CLINICAL DATA:  Shortness of breath.  History of CHF. EXAM: CHEST - 2 VIEW COMPARISON:  Chest radiograph 02/14/2022 FINDINGS: The left chest wall cardiac device and associated leads are stable. Cardiomegaly is unchanged. There is central vascular congestion with likely mild pulmonary interstitial edema. There is no focal consolidation. There is no pleural effusion. There is no pneumothorax There is no acute osseous abnormality. IMPRESSION: Cardiomegaly with vascular congestion and likely mild pulmonary interstitial edema. No focal consolidation or pleural effusion. Electronically Signed   By: PCourt JoyD.  On: 04/01/2022 11:46   CUP PACEART REMOTE DEVICE CHECK  Result Date: 03/27/2022 Scheduled remote reviewed. Normal device function.  Device reached ERI 11/3 RV threshold 1.875V @ 0.8m, trend ~ 1.5V 25 AMS, 2-12sec in duration BiV pacing 89%, PVC's 7.2% Next remote to be determined - route to triage LA  CUP [place] Result Date: 03/23/2022 CRT-P device check in clinic. Normal device function. Thresholds, sensing, impedance consistent with previous measurements. Histograms appropriate for patient and level of activity. false mode switches for FF, one NSVT. Patient bi-ventricularly pacing 95%__% of the time. Device programmed with appropriate safety margins. Device heart failure diagnostics are within normal limits and stable over time. Estimated longevity _ERI is reached, gen change scheduled__. Patient enrolled in remote follow-up. Plan  to check device remotely in 3 months and every 6 months in office.   Recent Labs: Lab Results  Component Value Date   WBC 8.0 04/01/2022   HGB  10.8 (L) 04/01/2022   PLT 539 (H) 04/01/2022   NA 137 04/01/2022   K 3.3 (L) 04/01/2022   CL 101 04/01/2022   CO2 26 04/01/2022   GLUCOSE 113 (H) 04/01/2022   BUN 20 04/01/2022   CREATININE 1.25 (H) 04/01/2022   BILITOT 0.8 03/24/2022   ALKPHOS 61 03/24/2022   AST 12 (L) 03/24/2022   ALT 7 03/24/2022   PROT 7.0 03/24/2022   ALBUMIN 4.1 03/24/2022   CALCIUM 7.0 (L) 04/01/2022   GFRAA 83 05/10/2020    Speciality Comments: No specialty comments available.  Procedures:  Small Joint Inj: R thumb CMC on 04/07/2022 12:10 PM Indications: pain Details: 27 G needle, radial approach Medications: 0.5 mL lidocaine 1 %; 20 mg triamcinolone acetonide 40 MG/ML Outcome: tolerated well, no immediate complications Procedure, treatment alternatives, risks and benefits explained, specific risks discussed. Consent was given by the patient. Immediately prior to procedure a time out was called to verify the correct patient, procedure, equipment, support staff and site/side marked as required. Patient was prepped and draped in the usual sterile fashion.    Large Joint Inj: L subacromial bursa on 04/07/2022 12:00 PM Indications: pain Details: 25 G 1.5 in needle, lateral approach  Arthrogram: No  Medications: 3 mL lidocaine 1 %; 40 mg triamcinolone acetonide 40 MG/ML Outcome: tolerated well, no immediate complications Consent was given by the patient. Immediately prior to procedure a time out was called to verify the correct patient, procedure, equipment, support staff and site/side marked as required. Patient was prepped and draped in the usual sterile fashion.     Allergies: Bupropion, Cephalexin, Ibuprofen, Prednisone, Temazepam, Trazodone and nefazodone, and Prozac [fluoxetine hcl]   Assessment / Plan:     Visit Diagnoses: Idiopathic chronic gout of multiple sites without tophus - Plan: Uric acid  Increased allopurinol dose to 200 mg we will recheck today now a month on this. If still above  goal continue titration to 300 mg. Still has swelling at his feet but I am not sure this really represents a gout flare looks more like OA and has superimposed pedal edema.  Rheumatoid arthritis involving multiple sites, unspecified whether rheumatoid factor present ([place]  History of RA no definite erosive disease changes. No active synovitis that I can appreciated and sed rate of 25 is very mild elevation and may be normal in 71y/o with known comorbidity. We can observe for now not restarting DMARD treatment.  Primary osteoarthritis involving multiple joints - Plan: Small Joint Inj, Large Joint Inj  Generalized OA  findings on exam and xrays. His thumb is 1st CMC joint OA and left shoulder has arthritis especially AC joint looks bad. Not a good candidate for NSAIDs.Last steroid injection was about 4 months ago for his low back. Intraarticular injections for shoulder and thumb today.  Orders: Orders Placed This Encounter  Procedures   Small Joint Inj   Large Joint Inj   Uric acid   No orders of the defined types were placed in this encounter.    Follow-Up Instructions: Return in about 3 months (around 07/07/2022) for Gout/RA/OA allopurinl/inj f/u 42mo.   CCollier Salina MD  Note - This record has been created using [employer]  Chart creation errors have been sought, but may not always  have been located. Such creation errors do not reflect on  the standard of medical care.

## 2022-04-08 LAB — URIC ACID: Uric Acid, Serum: 8.1 mg/dL — ABNORMAL HIGH (ref 4.0–8.0)

## 2022-04-10 ENCOUNTER — Telehealth: Payer: Self-pay

## 2022-04-10 MED ORDER — ALLOPURINOL 300 MG PO TABS: 300.0000 mg | ORAL_TABLET | Freq: Every day | ORAL | 2 refills | Status: DC

## 2022-04-10 NOTE — Addendum Note (Signed)
Addended by: Collier Salina on: 04/10/2022 12:38 PM   Modules accepted: Orders

## 2022-04-10 NOTE — Telephone Encounter (Signed)
The patient wife called to cancel wound check appointment because he has not been implanted yet. I told her I will have the scheduler call her back to reschedule the appointment. She requested that the appointment be on a Thursday or Friday. I told her I could not promise those days but will see what we can do.

## 2022-04-10 NOTE — Progress Notes (Signed)
Uric acid remains too high at 8.1 the goal is less than 6 to prevent gout inflammation. He can increase the allopurinol to 300 mg daily for this. I will send a new prescription that is the higher strength tablet for this that will be 1 daily. He can take 3 of the current ones at first if he has a lot on hand.

## 2022-04-12 DIAGNOSIS — G4733 Obstructive sleep apnea (adult) (pediatric): Secondary | ICD-10-CM | POA: Diagnosis not present

## 2022-04-13 ENCOUNTER — Ambulatory Visit: Payer: Medicare Other

## 2022-04-14 ENCOUNTER — Other Ambulatory Visit: Payer: Self-pay | Admitting: Cardiology

## 2022-04-14 DIAGNOSIS — R0602 Shortness of breath: Secondary | ICD-10-CM

## 2022-04-21 ENCOUNTER — Telehealth: Payer: Self-pay | Admitting: Cardiology

## 2022-04-21 NOTE — Telephone Encounter (Signed)
Returned call to Pt.  Advised no issue with generator change related to falls.  Pt indicates understanding.

## 2022-04-21 NOTE — Telephone Encounter (Signed)
Patient states that he has had 2 falls in the past 2 weeks and his PCP recommended following up with cardiology to make sure that this will not interfere with upcoming PPM replacement.

## 2022-04-27 NOTE — Pre-Procedure Instructions (Signed)
Instructed patient on the following items: Arrival time 0515 Nothing to eat or drink after midnight No meds AM of procedure Responsible person to drive you home and stay with you for 24 hrs Wash with special soap night before and morning of procedure

## 2022-04-28 ENCOUNTER — Other Ambulatory Visit: Payer: Self-pay

## 2022-04-28 ENCOUNTER — Ambulatory Visit (HOSPITAL_COMMUNITY)
Admission: RE | Disposition: A | Discharge status: Home / Self Care | Payer: Self-pay | Source: Home / Self Care | Attending: Internal Medicine

## 2022-04-28 ENCOUNTER — Ambulatory Visit (HOSPITAL_COMMUNITY): Payer: Medicare Other

## 2022-04-28 ENCOUNTER — Other Ambulatory Visit: Payer: Self-pay | Admitting: Internal Medicine

## 2022-04-28 ENCOUNTER — Ambulatory Visit (HOSPITAL_COMMUNITY)
Admission: RE | Admit: 2022-04-28 | Discharge: 2022-04-28 | Disposition: A | Discharge status: Home / Self Care | Payer: Medicare Other | Attending: Internal Medicine | Admitting: Internal Medicine

## 2022-04-28 DIAGNOSIS — I951 Orthostatic hypotension: Secondary | ICD-10-CM | POA: Insufficient documentation

## 2022-04-28 DIAGNOSIS — D45 Polycythemia vera: Secondary | ICD-10-CM | POA: Insufficient documentation

## 2022-04-28 DIAGNOSIS — I472 Ventricular tachycardia, unspecified: Secondary | ICD-10-CM | POA: Insufficient documentation

## 2022-04-28 DIAGNOSIS — I5042 Chronic combined systolic (congestive) and diastolic (congestive) heart failure: Secondary | ICD-10-CM | POA: Diagnosis not present

## 2022-04-28 DIAGNOSIS — I110 Hypertensive heart disease with heart failure: Secondary | ICD-10-CM | POA: Insufficient documentation

## 2022-04-28 DIAGNOSIS — Z87891 Personal history of nicotine dependence: Secondary | ICD-10-CM | POA: Diagnosis not present

## 2022-04-28 DIAGNOSIS — I428 Other cardiomyopathies: Secondary | ICD-10-CM | POA: Diagnosis not present

## 2022-04-28 DIAGNOSIS — I442 Atrioventricular block, complete: Secondary | ICD-10-CM | POA: Insufficient documentation

## 2022-04-28 DIAGNOSIS — Z6832 Body mass index (BMI) 32.0-32.9, adult: Secondary | ICD-10-CM | POA: Insufficient documentation

## 2022-04-28 DIAGNOSIS — J9811 Atelectasis: Secondary | ICD-10-CM | POA: Diagnosis not present

## 2022-04-28 DIAGNOSIS — R55 Syncope and collapse: Secondary | ICD-10-CM | POA: Diagnosis not present

## 2022-04-28 HISTORY — PX: LEAD INSERTION: EP1212

## 2022-04-28 HISTORY — PX: PPM GENERATOR CHANGEOUT: EP1233

## 2022-04-28 LAB — BASIC METABOLIC PANEL
Anion gap: 13 (ref 5–15)
BUN: 27 mg/dL — ABNORMAL HIGH (ref 8–23)
CO2: 22 mmol/L (ref 22–32)
Calcium: 9.3 mg/dL (ref 8.9–10.3)
Chloride: 105 mmol/L (ref 98–111)
Creatinine, Ser: 1.13 mg/dL (ref 0.61–1.24)
GFR, Estimated: >60 mL/min (ref >60)
Glucose, Bld: 113 mg/dL — ABNORMAL HIGH (ref 70–99)
Potassium: 4.2 mmol/L (ref 3.5–5.1)
Sodium: 140 mmol/L (ref 135–145)

## 2022-04-28 LAB — GLUCOSE, CAPILLARY
Glucose-Capillary: 114 mg/dL — ABNORMAL HIGH (ref 70–99)
Glucose-Capillary: 96 mg/dL (ref 70–99)

## 2022-04-28 SURGERY — PPM GENERATOR CHANGEOUT
Anesthesia: LOCAL

## 2022-04-28 MED ORDER — LIDOCAINE HCL 1 % IJ SOLN
INTRAMUSCULAR | Status: AC
  Filled 2022-04-28: qty 20

## 2022-04-28 MED ORDER — MIDAZOLAM HCL 5 MG/5ML IJ SOLN
INTRAMUSCULAR | Status: AC
  Filled 2022-04-28: qty 5

## 2022-04-28 MED ORDER — ACETAMINOPHEN 325 MG PO TABS: 325.0000 mg | ORAL_TABLET | ORAL | Status: DC | PRN

## 2022-04-28 MED ORDER — SODIUM CHLORIDE 0.9 % IV SOLN
INTRAVENOUS | Status: AC
  Filled 2022-04-28: qty 2

## 2022-04-28 MED ORDER — FENTANYL CITRATE (PF) 100 MCG/2ML IJ SOLN
INTRAMUSCULAR | Status: AC
  Filled 2022-04-28: qty 2

## 2022-04-28 MED ORDER — POVIDONE-IODINE 10 % EX SWAB
2.0000 | Freq: Once | CUTANEOUS | Status: AC
  Administered 2022-04-28: 2 via TOPICAL

## 2022-04-28 MED ORDER — LIDOCAINE HCL (PF) 1 % IJ SOLN
INTRAMUSCULAR | Status: DC | PRN
  Administered 2022-04-28: 50 mL

## 2022-04-28 MED ORDER — IOHEXOL 350 MG/ML SOLN
INTRAVENOUS | Status: DC | PRN
  Administered 2022-04-28: 15 mL

## 2022-04-28 MED ORDER — CEFAZOLIN SODIUM-DEXTROSE 2-4 GM/100ML-% IV SOLN
2.0000 g | INTRAVENOUS | Status: AC
  Administered 2022-04-28: 2 g via INTRAVENOUS

## 2022-04-28 MED ORDER — HEPARIN (PORCINE) IN NACL 1000-0.9 UT/500ML-% IV SOLN
INTRAVENOUS | Status: AC
  Filled 2022-04-28: qty 500

## 2022-04-28 MED ORDER — CHLORHEXIDINE GLUCONATE 4 % EX LIQD: 4.0000 | Freq: Once | CUTANEOUS | Status: DC

## 2022-04-28 MED ORDER — HEPARIN (PORCINE) IN NACL 1000-0.9 UT/500ML-% IV SOLN
INTRAVENOUS | Status: DC | PRN
  Administered 2022-04-28: 500 mL

## 2022-04-28 MED ORDER — SODIUM CHLORIDE 0.9 % IV SOLN: INTRAVENOUS | Status: DC

## 2022-04-28 MED ORDER — FENTANYL CITRATE (PF) 100 MCG/2ML IJ SOLN
INTRAMUSCULAR | Status: DC | PRN
  Administered 2022-04-28: 25 ug via INTRAVENOUS
  Administered 2022-04-28: 50 ug via INTRAVENOUS
  Administered 2022-04-28: 25 ug via INTRAVENOUS

## 2022-04-28 MED ORDER — SODIUM CHLORIDE 0.9 % IV SOLN
80.0000 mg | INTRAVENOUS | Status: AC
  Administered 2022-04-28: 80 mg

## 2022-04-28 MED ORDER — MIDAZOLAM HCL 5 MG/5ML IJ SOLN
INTRAMUSCULAR | Status: DC | PRN
  Administered 2022-04-28 (×2): 1 mg via INTRAVENOUS

## 2022-04-28 MED ORDER — ONDANSETRON HCL 4 MG/2ML IJ SOLN: 4.0000 mg | Freq: Four times a day (QID) | INTRAMUSCULAR | Status: DC | PRN

## 2022-04-28 MED ORDER — CEFAZOLIN SODIUM-DEXTROSE 2-4 GM/100ML-% IV SOLN
INTRAVENOUS | Status: AC
  Filled 2022-04-28: qty 100

## 2022-04-28 SURGICAL SUPPLY — 12 items
ASSURA CRTD CD3369-40C (ICD Generator) ×1 IMPLANT
CABLE SURGICAL S-101-97-12 (CABLE) ×1 IMPLANT
DEFIB ASSURA CRT-D (ICD Generator) IMPLANT
DEVICE DISSECT PLASMABLAD 3.0S (MISCELLANEOUS) IMPLANT
GUIDEWIRE ANGLED .035X150CM (WIRE) IMPLANT
HEMOSTAT SURGICEL 2X4 FIBR (HEMOSTASIS) IMPLANT
KIT MICROPUNCTURE NIT STIFF (SHEATH) IMPLANT
LEAD DURATA 7122-65CM (Lead) IMPLANT
PAD DEFIB RADIO PHYSIO CONN (PAD) ×1 IMPLANT
PLASMABLADE 3.0S (MISCELLANEOUS) ×1
SHEATH 7FR PRELUDE SNAP 13 (SHEATH) IMPLANT
TRAY PACEMAKER INSERTION (PACKS) ×1 IMPLANT

## 2022-04-28 NOTE — Progress Notes (Signed)
Pt and wife received d/c instructions, written and verbal. Device rep for [employer] came by prior to pt d/c. PIV removed and intact. Denies any acute pain or discomfort. Dressing in place to procedure site. No bleeding noted

## 2022-04-28 NOTE — Progress Notes (Signed)
Per Dr. Caryl Comes, if the pt feels okay to go they do not need to be seen by him. They are okay with that and will be d/c. Will continue to monitor.

## 2022-04-28 NOTE — H&P (Signed)
Patient Care Team: Antony Contras, MD as PCP - General (Family Medicine) Stanford Breed Denice Bors, MD as PCP - Cardiology (Cardiology) Deboraha Sprang, MD as Consulting Physician (Cardiology)   HPI  William Villanueva is a 71 y.o. 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   Breathing better, some edema with gout in L foot, no PND 2 pillow orthopnea,     DATE TEST EF    9/13 cMRI  34% LGE neg  2/16 Myoview  35%    3/17 Myoview    32 %    1/20 Echo  40-45%    9/21 Echo  30-35%    2/22 Echo  45-50%  Following ECG resynchronization        Date Cr K Hgb BNP  9/19 1.44 4.1 13.7    9/21  1.56 3.8 15.2 2864  4/23 1.3 4.5 13.7         Interval falls, one was a trip; at work fell without warning  pacemaker interrogation >> concurrent VT sustained 25 secs CL 300   Records and Results Reviewed   Past Medical History:  Diagnosis Date   AV block, 2nd degree 2015   St. Jude Allure Quadra pulse generator [id number], model PM 3242   Back pain    Cardiomyopathy ([place])    Nonischemic 45%.    CHF (congestive heart failure) ([place])    Gout    Hemochromatosis    Hypertension    Hypospadias 16-Apr-1951   born with   Nephrolithiasis    Polycythemia vera(238.4)     Past Surgical History:  Procedure Laterality Date   ANKLE SURGERY     BI-VENTRICULAR PACEMAKER INSERTION N/A 04/29/2014   Procedure: BI-VENTRICULAR PACEMAKER INSERTION (CRT-P);  Surgeon: Deboraha Sprang, MD; Laterality: Left  St. Jude Allure Quadra pulse generator [phone], model [place] 3242   CARDIAC SURGERY     COLONOSCOPY N/A 02/15/2020   Procedure: COLONOSCOPY;  Surgeon: Ronnette Juniper, MD;  Location: [facility];  Service: Gastroenterology;  Laterality: N/A;   ESOPHAGOGASTRODUODENOSCOPY (EGD) WITH PROPOFOL N/A 02/15/2020   Procedure: ESOPHAGOGASTRODUODENOSCOPY (EGD) WITH  PROPOFOL;  Surgeon: Ronnette Juniper, MD;  Location: [facility];  Service: Gastroenterology;  Laterality: N/A;   PILONIDAL CYST EXCISION     POSTERIOR LAMINECTOMY / DECOMPRESSION LUMBAR SPINE     TONSILLECTOMY      Current Facility-Administered Medications  Medication Dose Route Frequency Provider Last Rate Last Admin   0.9 %  sodium chloride infusion   Intravenous Continuous Deboraha Sprang, MD 50 mL/hr at 04/28/22 0550 New Bag at 04/28/22 0550   ceFAZolin (ANCEF) IVPB 2g/100 mL premix  2 g Intravenous On Call Deboraha Sprang, MD       chlorhexidine (HIBICLENS) 4 % liquid 4 Application  4 Application Topical Once Deboraha Sprang, MD       gentamicin (GARAMYCIN) 80 mg in sodium chloride 0.9 % 500 mL irrigation  80 mg Irrigation On Call Deboraha Sprang, MD        Allergies  Allergen Reactions   Advil [Ibuprofen] Itching   Keflex [Cephalexin] Diarrhea and Other (See Comments)    Caused C-diff, also    Wellbutrin [Bupropion] Hives and Other (See Comments)   Prednisone Itching and Other (See Comments)    Abdominal pain, also    Temazepam Other (See Comments)  Dizziness    Trazodone And Nefazodone Other (See Comments)    Dizziness   Prozac [Fluoxetine Hcl] Itching      Social History   Tobacco Use   Smoking status: Former    Packs/day: 1.00    Years: 44.00    Total pack years: 44.00    Types: Cigarettes    Start date: 06/05/1968    Quit date: 04/05/2013    Years since quitting: 9.0    Passive exposure: Never   Smokeless tobacco: Never  Vaping Use   Vaping Use: Never used  Substance Use Topics   Alcohol use: Yes    Alcohol/week: 0.0 standard drinks of alcohol    Comment: rare   Drug use: No     Family History  Problem Relation Age of Onset   Stroke Mother    Other Father        Deceased, car fell on him   Diabetes Sister    Hypertension Sister    Heart disease Maternal Grandmother        Pacemaker, MI     Current Meds  Medication Sig   acetaminophen  (TYLENOL) 500 MG tablet Take 1,000 mg by mouth every 6 (six) hours as needed for headache.   acetaminophen-codeine (TYLENOL #3) 300-30 MG tablet Take 1 tablet by mouth at bedtime.   allopurinol (ZYLOPRIM) 300 MG tablet Take 1 tablet (300 mg total) by mouth daily.   budesonide (PULMICORT) 0.5 MG/2ML nebulizer solution Inhale one vial in nebulizer twice a day. **Rinse mouth after use.   carvedilol (COREG) 3.125 MG tablet Take 1 tablet (3.125 mg total) by mouth 2 (two) times daily with a meal.   cholestyramine (QUESTRAN) 4 GM/DOSE powder Take 1 g by mouth 2 (two) times daily as needed (diarrhea).   citalopram (CELEXA) 10 MG tablet Take 5 mg by mouth in the morning.   Cyanocobalamin (B-12 PO) Take 1 tablet by mouth daily.   diclofenac Sodium (VOLTAREN) 1 % GEL Apply 2 g topically 4 (four) times daily. Apply to left shoulder for pain. (Patient taking differently: Apply 2 g topically daily as needed (Apply to left shoulder for pain.).)   dicyclomine (BENTYL) 20 MG tablet Take 20 mg by mouth 2 (two) times daily.   famotidine (PEPCID) 20 MG tablet One after supper   folic acid (FOLVITE) 1 MG tablet Take 1 mg by mouth in the morning.   furosemide (LASIX) 40 MG tablet Take 2 tablets (80 mg total) by mouth daily. (Patient taking differently: Take 40-80 mg by mouth in the morning.)   hydroxyurea (HYDREA) 500 MG capsule Take 2 capsules (1,000 mg total) by mouth daily. May take with food to minimize GI side effects. (Patient taking differently: Take 500 mg by mouth 2 (two) times daily. May take with food to minimize GI side effects.)   insulin degludec (TRESIBA FLEXTOUCH) 100 UNIT/ML SOPN FlexTouch Pen Inject 20 Units into the skin daily after breakfast.    ipratropium-albuterol (DUONEB) 0.5-2.5 (3) MG/3ML SOLN Inhale one vial in nebulizer 3 times a day and every 6 hrs as needed. (Patient taking differently: Take 3 mLs by nebulization 2 (two) times daily.)   ketoconazole (NIZORAL) 2 % cream Apply 1 Application  topically daily.   losartan (COZAAR) 25 MG tablet Take 1 tablet (25 mg total) by mouth daily. (Patient taking differently: Take 12.5 mg by mouth daily.)   Magnesium Oxide 400 MG CAPS Take 2 capsules (800 mg total) by mouth in the morning and at bedtime. (Patient  taking differently: Take 400 mg by mouth in the morning and at bedtime.)   omeprazole (PRILOSEC) 20 MG capsule Take 20 mg by mouth in the morning and at bedtime.   oxybutynin (DITROPAN XL) 15 MG 24 hr tablet Take 15 mg by mouth in the morning.   pantoprazole (PROTONIX) 40 MG tablet Take 1 tablet (40 mg total) by mouth daily. Take 30-60 min before first meal of the day   potassium chloride SA (KLOR-CON M) 20 MEQ tablet Take 1 tablet (20 mEq total) by mouth daily for 5 days.   prednisoLONE acetate (PRED FORTE) 1 % ophthalmic suspension Place 1 drop into both eyes daily as needed (eye irritation.).   rosuvastatin (CRESTOR) 5 MG tablet Take 5 mg by mouth in the morning.   spironolactone (ALDACTONE) 25 MG tablet Take 0.5 tablets (12.5 mg total) by mouth daily.   tamsulosin (FLOMAX) 0.4 MG CAPS capsule Take 0.4 mg by mouth 2 (two) times daily.    Turmeric (QC TUMERIC COMPLEX) 500 MG CAPS Take 1 tablet by mouth daily.     Review of Systems negative except from HPI and PMH  Physical Exam BP 135/77   Pulse 69   Temp 97.8 F (36.6 C) (Temporal)   Resp 20   Ht 5' 10"  (1.778 m)   Wt 101.2 kg   SpO2 95%   BMI 32.00 kg/m  Well developed and well nourished in no acute distress HENT normal E scleral and icterus clear Neck Supple JVP flat; carotids brisk and full Clear to ausculation Regular rate and rhythm, no murmurs gallops or rub Soft with active bowel sounds No clubbing cyanosis  Edema Alert and oriented, grossly normal motor and sensory function Skin Warm and Dry    Assessment and  Plan ASSESSMENT AND PLAN:   NICM   CHF chronic sys/HFpFF   Hypertension   Morbidly obese   Pacemaker-CRT-St. Jude     Orthostatic  hypotension   Syncope  VT sustained     Device at ERI   with interval syncope and VT will upgrade the device to HV  Have reviewed the potential benefits and risks of ICD implantation including but not limited to death, perforation of heart or lung, lead dislodgement, infection,  device malfunction and inappropriate shocks.  The patient expresses understanding  and is  willing to proceed.

## 2022-04-28 NOTE — Discharge Instructions (Addendum)
     Supplemental Discharge Instructions for  Pacemaker/Defibrillator Patients  Tomorrow, 04/29/22, send in a device transmission  Activity No heavy lifting or vigorous activity with your left/right arm for 6 to 8 weeks.  Do not raise your left/right arm above your head for one week.  Gradually raise your affected arm as drawn below.           __  NO DRIVING until you are cleared to by Dr. Caryl Comes.  WOUND CARE Keep the wound area clean and dry.  Do not get this area wet , no showers until you are cleared to at your wound chek visit . Tomorrow, 04/29/22, remove the arm sling Tomorrow, 04/29/22 remove the outer plastic bandage.  Underneath the plastic bandage there are steri strips (paper tapes), DO NOT remove these. The tape/steri-strips on your wound will fall off; do not pull them off.  No bandage is needed on the site.  DO  NOT apply any creams, oils, or ointments to the wound area. If you notice any drainage or discharge from the wound, any swelling or bruising at the site, or you develop a fever > 101? F after you are discharged home, call the office at once.  Special Instructions You are still able to use cellular telephones; use the ear opposite the side where you have your pacemaker/defibrillator.  Avoid carrying your cellular phone near your device. When traveling through airports, show security personnel your identification card to avoid being screened in the metal detectors.  Ask the security personnel to use the hand wand. Avoid arc welding equipment, MRI testing (magnetic resonance imaging), TENS units (transcutaneous nerve stimulators).  Call the office for questions about other devices. Avoid electrical appliances that are in poor condition or are not properly grounded. Microwave ovens are safe to be near or to operate.  Additional information for defibrillator patients should your device go off: If your device goes off ONCE and you feel fine afterward, notify the device  clinic nurses. If your device goes off ONCE and you do not feel well afterward, call 911. If your device goes off TWICE, call 911. If your device goes off THREE times in one day, call 911.  DO NOT DRIVE YOURSELF OR A FAMILY MEMBER WITH A DEFIBRILLATOR TO THE HOSPITAL--CALL 911.

## 2022-05-02 ENCOUNTER — Encounter (HOSPITAL_COMMUNITY): Payer: Self-pay | Admitting: Internal Medicine

## 2022-05-02 MED FILL — Lidocaine HCl Local Inj 1%: INTRAMUSCULAR | Qty: 50 | Status: AC

## 2022-05-04 ENCOUNTER — Other Ambulatory Visit: Payer: Self-pay | Admitting: Pulmonary Disease

## 2022-05-09 NOTE — Progress Notes (Signed)
Remote pacemaker transmission.   

## 2022-05-11 ENCOUNTER — Encounter: Payer: Self-pay | Admitting: Hematology & Oncology

## 2022-05-11 ENCOUNTER — Ambulatory Visit: Payer: PPO | Attending: Internal Medicine

## 2022-05-11 DIAGNOSIS — I428 Other cardiomyopathies: Secondary | ICD-10-CM

## 2022-05-11 NOTE — Patient Instructions (Addendum)
   After Your ICD (Implantable Cardiac Defibrillator)    Monitor your defibrillator site for redness, swelling, and drainage. Call the device clinic at [phone] if you experience these symptoms or fever/chills.  Your incision was closed with Steri-strips or staples:  You may shower 7 days after your procedure and wash your incision with soap and water. Avoid lotions, ointments, or perfumes over your incision until it is well-healed.  You have developed a small  hematoma at wound site.  This can sometimes happen and will resolve itself over the next few weeks.  Continue to monitor the area.  If the softness of the area gets harder or larger, call the clinic and let us know so we may re-evaluate. Monitor for any signs of infection at the site including redness, increased swelling, drainage from wound site or fever/chills. If develops, call office or if weekend seek urgent evaluation.   You may use a hot tub or a pool after your wound check appointment if the incision is completely closed.  Do not lift, push or pull greater than 10 pounds with the affected arm until 6 weeks after your procedure. There are no other restrictions in arm movement after your wound check appointment. Until AFTER February 2nd.    Your ICD is designed to protect you from life threatening heart rhythms. Because of this, you may receive a shock.   1 shock with no symptoms:  Call the office during business hours. 1 shock with symptoms (chest pain, chest pressure, dizziness, lightheadedness, shortness of breath, overall feeling unwell):  Call 911. If you experience 2 or more shocks in 24 hours:  Call 911. If you receive a shock, you should not drive.   DMV - no driving for 6 months if you receive appropriate therapy from your ICD.   ICD Alerts:  Some alerts are vibratory and others beep. These are NOT emergencies. Please call our office to let us know. If this occurs at night or on weekends, it can wait until the  next business day. Send a remote transmission.  If your device is capable of reading fluid status (for heart failure), you will be offered monthly monitoring to review this with you.   Remote monitoring is used to monitor your ICD from home. This monitoring is scheduled every 91 days by our office. It allows [place] to keep an eye on the functioning of your device to ensure it is working properly. You will routinely see your Electrophysiologist annually (more often if necessary).

## 2022-05-17 LAB — CUP PACEART INCLINIC DEVICE CHECK
Brady Statistic RA Percent Paced: 2.6 %
Date Time Interrogation Session: 20240104090247
Implantable Lead Connection Status: 753985
Implantable Lead Connection Status: 753985
Implantable Lead Connection Status: 753985
Implantable Lead Implant Date: 20151223
Implantable Lead Implant Date: 20151223
Implantable Lead Implant Date: 20231222
Implantable Lead Location: 753858
Implantable Lead Location: 753859
Implantable Lead Location: 753860
Implantable Lead Model: 7122
Implantable Pulse Generator Implant Date: 20231222
Lead Channel Impedance Value: 410 Ohm
Lead Channel Impedance Value: 480 Ohm
Lead Channel Impedance Value: 640 Ohm
Lead Channel Pacing Threshold Amplitude: 0.5 V
Lead Channel Pacing Threshold Amplitude: 1 V
Lead Channel Pacing Threshold Amplitude: 1.25 V
Lead Channel Pacing Threshold Pulse Width: 0.5 ms
Lead Channel Pacing Threshold Pulse Width: 0.5 ms
Lead Channel Pacing Threshold Pulse Width: 0.5 ms
Lead Channel Sensing Intrinsic Amplitude: 12 mV
Lead Channel Sensing Intrinsic Amplitude: 4.1 mV
Pulse Gen Serial Number: 5555736

## 2022-05-17 NOTE — Progress Notes (Signed)

## 2022-05-17 NOTE — Progress Notes (Unsigned)
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 Hurting in chest but  breathing ok s inhalers needed until gradually worsened since Dec 2015 referred by [facility] for unexplained sob p admit but pfts s airflow obst 08/28/2014    Admit date: 06/21/2014 Discharge date: 06/23/2014  Primary Care Provider: Antony Contras Villanueva Primary Cardiologist: Dr. Kirk Villanueva  Discharge Diagnoses Principal Problem:   Chest tightness Active Problems:   Polycythemia vera   Back pain   Tobacco use   Congestive dilated cardiomyopathy   Pacemaker   Dyspnea     Procedures  Echocardiogram 06/22/2014 - Left ventricle: The cavity size was mildly dilated. Wall   thickness was normal. Systolic function was normal. The estimated   ejection fraction was in the range of 50% to 55%. Doppler   parameters are consistent with abnormal left ventricular   relaxation (grade 1 diastolic dysfunction). - Left atrium: The atrium was moderately dilated. - Pulmonary arteries: PA peak pressure: 38 mm Hg (S).  Impressions:  - Poor acoustic windows, even with echo contrast, limit study      2 day stress test 06/23/2014 IMPRESSION: 1. No reversible ischemia or infarction.   2. Normal left ventricular wall motion.   3. Left ventricular ejection fraction 35%   4. Intermediate-risk stress test findings* secondary to reduced ejection fraction of 35%. Otherwise there is no ischemia identified. Personally reviewed echocardiogram which appears to have reduced ejection fraction in the 40% range.     Hospital Course  The patient is a 72 year old male with past medical history of polycythemia vera, hypertension, history of nonischemic cardiomyopathy with baseline EF 45%. His last cardiac catheterization in 2013 showed mild nonobstructive CAD. He had  placement of biventricular pacemaker in December 2015. Patient presented to [facility] on [facility]. According to him, ever since pacemaker placement, he has been having shortness breath and episodic chest discomfort. He was admitted to cardiology service, serial troponin when has been negative. CTA was negative for PE or pneumothorax, was noted he had a small amount of pericardial fluid, however and no active pulmonary disease. Echocardiogram was obtained on 06/22/2014 which showed EF 65-03%, grade 1 diastolic dysfunction, PA peak pressure 38. No pericardial effusion was noted on echocardiogram. There was no obvious sign of microperforation after recently pacemaker placement. Given his persistent symptoms, 2 day lexiscan stress test was obtained which came back on 06/23/2014. The stress test shows EF 35%, normal left ventricular wall motion, no reversible ischemia or infarction.   Patient was seen in the morning of 2/16, at which time he denies any obvious chest pain. He is deemed stable for discharge from cardiology perspective. During this admission, his carvedilol was increased to 25 mg twice a day for Villanueva rate control. Imdur was added to help with chest pain. Patient will need a outpatient sleep study which her wife has already scheduled for him at the beginning of March.         07/23/2014 1st [facility] office visit/ William Villanueva  / on lisinopril 5 mg daily  Chief Complaint  Patient presents with   Pulmonary Consult    Referred by Dr. Moreen Villanueva. Pt c/o SOB and cough since Dec 2015. He states that he wakes up in the night gasping for air and  feels SOB occ during the day. He is coughing up large amounts of sputum- normally clear, but yellow for the past 2 days.   since Feb 6th some Villanueva   ?  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 Pt states that his cough has resolved. His breathing has started to improve. He was started on CPAP approx 1 wk ago per William Villanueva in [place].   Still sob "across a parking lot" Rec No copd/just obesity /deconditioning so f/u prn        12/12/2021 Acute ov/William Villanueva re: DOE  maint on [employer]  Chief Complaint  Patient presents with   Acute Visit    Increased DOE.  He states about a month ago took a capsule and "it got caught in my windpipe"- coughed up a capsule a few days later. He was winded today walking from lobby to closest exam room. He has noticed some scrabs forming in his nose and nasal congestion.   Dyspnea:  100 ft slow pace, flat level  Cough: Villanueva now but still has sense of globus  Sleeping: bed is flat/ R side down / one pillow SABA use: maint bid neb does not take extras 02: none  Covid status:   vax x 4  Rec Change prilosec Take 30- 60 min before your first and last meals of the day  GERD diet reviewed, bed blocks rec  We will call for ENT evaluation in [facility] ASAP > problem resolved when stopped pill for diarhhea      04/06/2022  f/u ov/William Villanueva re: doe /AB suspected   maint on duoneb/pulmocort  Chief Complaint  Patient presents with   Follow-up    Had RSV 1 mth. Ago, cough-white, green, yellow, sob, no fever  Was fine until around  GC's 3 weeks prior to OV  > dx RSV  11/25 doxy  Dyspnea:  walking Villanueva  Cough: only a little Villanueva, much worse at hs less dark since 5 days of doxy Sleeping: flat bed  SABA use: just bid duoneb 02: 2lpm hs and prn  Rec For cough > Mucinex dm 1200 mg every 12 hours and if still can't stop :  tylenol 3# up to every  4 hours and / or benzoate every 6 hours if can't afford to be dowsy For Breathing > add  extra duoneb at lunch and bedtime Depomedrol 120 mg IM  Stop prilosec:  Pantoprazole (protonix) 40 mg   Take  30-60 min before first meal of the day and Pepcid (famotidine)  20 mg after supper until return to office - this is the best way to tell whether stomach acid is contributing to your problem.   GERD diet reviewed, bed blocks rec   Please schedule a follow up office visit in 6 weeks, call sooner if needed   Late add : finish another 7 d doxy as muliple drug intol/allergies listed and doing fine with doxy but not clear yet     05/18/2022  f/u ov/Azya Barbero re: ***  maint on ***  No chief complaint on file.   Dyspnea:  *** Cough: *** Sleeping: *** SABA use: *** 02: *** Covid status:   *** Lung cancer screening :  ***    No obvious day to day or daytime variability or assoc excess/ purulent sputum or mucus plugs or hemoptysis or cp or chest tightness, subjective wheeze or overt sinus or hb symptoms.   *** without nocturnal  or early am exacerbation  of respiratory  c/o's or need for noct saba. Also denies any obvious fluctuation of symptoms with weather or environmental changes or other aggravating or alleviating factors except as outlined above   No unusual exposure hx or h/o childhood pna/ asthma or knowledge of premature birth.  Current Allergies, Complete Past Medical History, Past Surgical History, Family History, and Social History were reviewed in [employer] record.  ROS  The following are not active complaints unless bolded Hoarseness, sore throat, dysphagia, dental problems, itching, sneezing,  nasal congestion or discharge of excess mucus or purulent secretions, ear ache,   fever, chills, sweats, unintended wt loss or wt gain, classically pleuritic or exertional cp,  orthopnea pnd or arm/hand swelling  or leg swelling, presyncope, palpitations, abdominal pain, anorexia, nausea, vomiting, diarrhea  or change in bowel habits or change in bladder habits,  change in stools or change in urine, dysuria, hematuria,  rash, arthralgias, visual complaints, headache, numbness, weakness or ataxia or problems with walking or coordination,  change in mood or  memory.        No outpatient medications have been marked as taking for the 05/18/22 encounter (Appointment) with Tanda Rockers, MD.        I personally reviewed images and agree with radiology impression as follows:  CXR:   04/01/22 Cardiomegaly with vascular congestion and likely mild pulmonary interstitial edema. No focal consolidation or pleural effusion.       Objective:   Physical Exam   wts  05/18/2022        ***  04/06/2022      244  12/12/2021          265  08/28/2014        277     07/23/14 268 lb 6.4 oz (121.745 kg)  06/21/14 280 lb 3.3 oz (127.1 kg)  06/17/14 278 lb (126.1 kg)      Vital signs reviewed  05/18/2022  - Note at rest 02 sats  ***% on ***   General appearance:    ***        Assessment:

## 2022-05-18 ENCOUNTER — Encounter: Payer: Self-pay | Admitting: Internal Medicine

## 2022-05-18 ENCOUNTER — Ambulatory Visit: Payer: PPO | Admitting: Internal Medicine

## 2022-05-18 ENCOUNTER — Other Ambulatory Visit (HOSPITAL_COMMUNITY): Payer: Self-pay

## 2022-05-18 ENCOUNTER — Encounter: Payer: Self-pay | Admitting: Hematology & Oncology

## 2022-05-18 VITALS — BP 134/74 | HR 60 | Ht 70.0 in | Wt 229.3 lb

## 2022-05-18 DIAGNOSIS — J45901 Unspecified asthma with (acute) exacerbation: Secondary | ICD-10-CM | POA: Diagnosis not present

## 2022-05-18 DIAGNOSIS — J449 Chronic obstructive pulmonary disease, unspecified: Secondary | ICD-10-CM | POA: Diagnosis not present

## 2022-05-18 DIAGNOSIS — G4734 Idiopathic sleep related nonobstructive alveolar hypoventilation: Secondary | ICD-10-CM | POA: Diagnosis not present

## 2022-05-18 DIAGNOSIS — R0609 Other forms of dyspnea: Secondary | ICD-10-CM | POA: Diagnosis not present

## 2022-05-18 MED ORDER — IPRATROPIUM-ALBUTEROL 0.5-2.5 (3) MG/3ML IN SOLN: RESPIRATORY_TRACT | Status: DC

## 2022-05-18 NOTE — Assessment & Plan Note (Signed)
ONO RA  12/20/21  desat < 89% x 5h 49 min  So 12/30/2021 rec 2lpm and repeat ono on 2lpm  - pt stopped all 02 mid dec 2023 - Patient walked at moderate pace with a cane. Pt was able to complete 500 ft Pt states very fatigued and fast breathing. Oxygen sats at 96% at end    >>> ok to d/c all 02 and monitor sats at peak ex with target  > 90%   Each maintenance medication was reviewed in detail including emphasizing most importantly the difference between maintenance and prns and under what circumstances the prns are to be triggered using an action plan format where appropriate.  Total time for H and P, chart review, counseling, reviewing neb/02 device(s) , directly observing portions of ambulatory 02 saturation study/ and generating customized AVS unique to this office visit / same day charting = 25 min

## 2022-05-18 NOTE — Assessment & Plan Note (Signed)
Evaluated  in Pulmonary clinic/ [facility]/ Aniello Christopoulos w/u completed 08/28/14  - off acei 07/23/2014 > wheezing resolved - PFT's 08/28/14 mild restrictive changes with nl dlco p correction for alv vol  - 08/28/2014   Walked RA x 3 laps @ 174f each  stopped due to  Back pain, nl pace, no desat or sob  - 12/12/2021 recurrent sense of globus with pseudowheeze on exam > referred back to ENT/ max rx for gerd empirically > neg eval 01/11/22  - 04/06/2022  After extensive coaching inhaler device,  effectiveness =    75%   Well controlled on duoneb/pulmicort bid with min need for supplemental duoneb and no longer 02 dep   >>>  f/u q 6 m sooner if needed with all meds in hand using a trust but verify approach to confirm accurate Medication  Reconciliation The principal here is that until we are certain that the  patients are doing what we've asked, it makes no sense to ask them to do more.

## 2022-05-18 NOTE — Patient Instructions (Signed)
Stop all oxygen   Please schedule a follow up visit in 6  months but call sooner if needed  - bring all active meds/ inhalers/solutions

## 2022-05-25 ENCOUNTER — Encounter: Payer: Self-pay | Admitting: Hematology & Oncology

## 2022-05-25 ENCOUNTER — Inpatient Hospital Stay (HOSPITAL_BASED_OUTPATIENT_CLINIC_OR_DEPARTMENT_OTHER): Payer: PPO | Admitting: Hematology & Oncology

## 2022-05-25 ENCOUNTER — Inpatient Hospital Stay: Payer: PPO | Attending: Hematology & Oncology

## 2022-05-25 VITALS — HR 97 | Temp 97.7°F | Resp 18 | Ht 70.0 in | Wt 228.8 lb

## 2022-05-25 DIAGNOSIS — D45 Polycythemia vera: Secondary | ICD-10-CM | POA: Diagnosis not present

## 2022-05-25 DIAGNOSIS — I10 Essential (primary) hypertension: Secondary | ICD-10-CM

## 2022-05-25 DIAGNOSIS — Z79899 Other long term (current) drug therapy: Secondary | ICD-10-CM | POA: Insufficient documentation

## 2022-05-25 LAB — CMP (CANCER CENTER ONLY)
ALT: 10 U/L (ref 0–44)
AST: 13 U/L — ABNORMAL LOW (ref 15–41)
Albumin: 3.9 g/dL (ref 3.5–5.0)
Alkaline Phosphatase: 62 U/L (ref 38–126)
Anion gap: 11 (ref 5–15)
BUN: 22 mg/dL (ref 8–23)
CO2: 22 mmol/L (ref 22–32)
Calcium: 9.2 mg/dL (ref 8.9–10.3)
Chloride: 105 mmol/L (ref 98–111)
Creatinine: 1.11 mg/dL (ref 0.61–1.24)
GFR, Estimated: >60 mL/min (ref >60)
Glucose, Bld: 109 mg/dL — ABNORMAL HIGH (ref 70–99)
Potassium: 4.1 mmol/L (ref 3.5–5.1)
Sodium: 138 mmol/L (ref 135–145)
Total Bilirubin: 0.6 mg/dL (ref 0.3–1.2)
Total Protein: 7.1 g/dL (ref 6.5–8.1)

## 2022-05-25 LAB — FERRITIN: Ferritin: 27 ng/mL (ref 24–336)

## 2022-05-25 LAB — CBC WITH DIFFERENTIAL (CANCER CENTER ONLY)
Abs Immature Granulocytes: 0.09 10*3/uL — ABNORMAL HIGH (ref 0.00–0.07)
Basophils Absolute: 0.1 10*3/uL (ref 0.0–0.1)
Basophils Relative: 2 %
Eosinophils Absolute: 0.1 10*3/uL (ref 0.0–0.5)
Eosinophils Relative: 1 %
HCT: 40.5 % (ref 39.0–52.0)
Hemoglobin: 12.8 g/dL — ABNORMAL LOW (ref 13.0–17.0)
Immature Granulocytes: 1 %
Lymphocytes Relative: 7 %
Lymphs Abs: 0.5 10*3/uL — ABNORMAL LOW (ref 0.7–4.0)
MCH: 35.1 pg — ABNORMAL HIGH (ref 26.0–34.0)
MCHC: 31.6 g/dL (ref 30.0–36.0)
MCV: 111 fL — ABNORMAL HIGH (ref 80.0–100.0)
Monocytes Absolute: 0.8 10*3/uL (ref 0.1–1.0)
Monocytes Relative: 10 %
Neutro Abs: 5.6 10*3/uL (ref 1.7–7.7)
Neutrophils Relative %: 79 %
Platelet Count: 533 10*3/uL — ABNORMAL HIGH (ref 150–400)
RBC: 3.65 MIL/uL — ABNORMAL LOW (ref 4.22–5.81)
RDW: 14.5 % (ref 11.5–15.5)
WBC Count: 7.2 10*3/uL (ref 4.0–10.5)
nRBC: 0 % (ref 0.0–0.2)

## 2022-05-25 LAB — IRON AND IRON BINDING CAPACITY (CC-WL,HP ONLY)
Iron: 71 ug/dL (ref 45–182)
Saturation Ratios: 19 % (ref 17.9–39.5)
TIBC: 371 ug/dL (ref 250–450)
UIBC: 300 ug/dL (ref 117–376)

## 2022-05-25 LAB — LACTATE DEHYDROGENASE: LDH: 371 U/L — ABNORMAL HIGH (ref 98–192)

## 2022-05-25 NOTE — Progress Notes (Signed)
Hematology and Oncology Follow Up Visit  William Villanueva [id number] [birth year] 72 y.o. 05/25/2022   Principle Diagnosis:  Polycythemia vera-JAK2 positive Heart block-Mobitz II  Current Therapy:   Hydrea 500 mg p.o.BID -dose changed on 08/12/2021 Aspirin 81 mg by mouth daily Phlebotomy to maintain hematocrit below 45%     Interim History:  William Villanueva is back for followup.  He has a new pacemaker in.  He has he has a Investment banker, corporate.  He apparently passed out right around [place] Years.  He was post have the battery changed.  However, they found out that the agile pacemaker was really not working all that well.  Otherwise he is doing well.  He is continue to lose weight.  He wants to try to lose weight.  He is now down to 223 pounds.  He is lost close to 100 pounds.  He is down to just by decreasing what he eats.  He did have a nice Holiday season.  As always, he was with the family.  He is still working.  He works twice a week.  He has had no problems with cough or shortness of breath.  He has had no nausea or vomiting.  His back is doing little bit better since he lost weight.  His iron studies that we did back in November showed a ferritin of 18 with iron saturation of 22%.  Overall, I would have to say that his performance status is probably ECOG 1.   Medications:  Current Outpatient Medications:    allopurinol (ZYLOPRIM) 300 MG tablet, Take 1 tablet (300 mg total) by mouth daily., Disp: 30 tablet, Rfl: 2   budesonide (PULMICORT) 0.5 MG/2ML nebulizer solution, USE 1 VIAL VIA NEBULIZER TWICE  DAILY (RINSE MOUTH AFTER USE), Disp: 360 mL, Rfl: 3   carvedilol (COREG) 3.125 MG tablet, Take 1 tablet (3.125 mg total) by mouth 2 (two) times daily with a meal., Disp: 200 tablet, Rfl: 2   cholestyramine (QUESTRAN) 4 GM/DOSE powder, Take 1 g by mouth 2 (two) times daily as needed (diarrhea)., Disp: , Rfl:    citalopram (CELEXA) 10 MG tablet, Take 5 mg by mouth in the morning., Disp:  , Rfl:    Cyanocobalamin (B-12 PO), Take 1 tablet by mouth daily., Disp: , Rfl:    diclofenac Sodium (VOLTAREN) 1 % GEL, Apply 2 g topically 4 (four) times daily. Apply to left shoulder for pain. (Patient taking differently: Apply 2 g topically daily as needed (Apply to left shoulder for pain.).), Disp: 200 g, Rfl: 0   dicyclomine (BENTYL) 20 MG tablet, Take 20 mg by mouth 2 (two) times daily., Disp: , Rfl:    famotidine (PEPCID) 20 MG tablet, One after supper, Disp: 30 tablet, Rfl: 11   folic acid (FOLVITE) 1 MG tablet, Take 1 mg by mouth in the morning., Disp: , Rfl:    furosemide (LASIX) 40 MG tablet, Take 2 tablets (80 mg total) by mouth daily. (Patient taking differently: Take 40-80 mg by mouth in the morning.), Disp: 30 tablet, Rfl: 0   hydroxyurea (HYDREA) 500 MG capsule, Take 2 capsules (1,000 mg total) by mouth daily. May take with food to minimize GI side effects. (Patient taking differently: Take 500 mg by mouth 2 (two) times daily. May take with food to minimize GI side effects.), Disp: 180 capsule, Rfl: 3   insulin degludec (TRESIBA FLEXTOUCH) 100 UNIT/ML SOPN FlexTouch Pen, Inject 20 Units into the skin daily after breakfast. , Disp: , Rfl:  ipratropium-albuterol (DUONEB) 0.5-2.5 (3) MG/3ML SOLN, Inhale one vial in nebulizer twice daily and in between if needed, Disp: , Rfl:    ketoconazole (NIZORAL) 2 % cream, Apply 1 Application topically daily., Disp: , Rfl:    losartan (COZAAR) 25 MG tablet, Take 1 tablet (25 mg total) by mouth daily. (Patient taking differently: Take 12.5 mg by mouth daily.), Disp: 30 tablet, Rfl: 0   Magnesium Oxide 400 MG CAPS, Take 2 capsules (800 mg total) by mouth in the morning and at bedtime. (Patient taking differently: Take 400 mg by mouth in the morning and at bedtime.), Disp: 90 capsule, Rfl: 1   omeprazole (PRILOSEC) 20 MG capsule, Take 20 mg by mouth in the morning and at bedtime., Disp: , Rfl:    oxybutynin (DITROPAN XL) 15 MG 24 hr tablet, Take 15 mg  by mouth in the morning., Disp: , Rfl:    pantoprazole (PROTONIX) 40 MG tablet, Take 1 tablet (40 mg total) by mouth daily. Take 30-60 min before first meal of the day, Disp: 30 tablet, Rfl: 2   prednisoLONE acetate (PRED FORTE) 1 % ophthalmic suspension, Place 1 drop into both eyes daily as needed (eye irritation.)., Disp: , Rfl:    rosuvastatin (CRESTOR) 5 MG tablet, Take 5 mg by mouth in the morning., Disp: , Rfl:    spironolactone (ALDACTONE) 25 MG tablet, Take 0.5 tablets (12.5 mg total) by mouth daily., Disp: 45 tablet, Rfl: 3   tamsulosin (FLOMAX) 0.4 MG CAPS capsule, Take 0.4 mg by mouth 2 (two) times daily. , Disp: , Rfl:    Turmeric (QC TUMERIC COMPLEX) 500 MG CAPS, Take 1 tablet by mouth daily., Disp: , Rfl:    acetaminophen (TYLENOL) 500 MG tablet, Take 1,000 mg by mouth every 6 (six) hours as needed for headache. (Patient not taking: Reported on 05/25/2022), Disp: , Rfl:    potassium chloride SA (KLOR-CON M) 20 MEQ tablet, Take 1 tablet (20 mEq total) by mouth daily for 5 days., Disp: 5 tablet, Rfl: 0   zolpidem (AMBIEN) 5 MG tablet, Take 1 tablet (5 mg total) by mouth at bedtime as needed for sleep. (Patient not taking: Reported on 05/25/2022), Disp: 15 tablet, Rfl: 0  Allergies:  Allergies  Allergen Reactions   Advil [Ibuprofen] Itching   Keflex [Cephalexin] Diarrhea and Other (See Comments)    Caused C-diff, also    Wellbutrin [Bupropion] Hives and Other (See Comments)   Prednisone Itching and Other (See Comments)    Abdominal pain, also    Temazepam Other (See Comments)    Dizziness    Trazodone And Nefazodone Other (See Comments)    Dizziness   Sacubitril-Valsartan Other (See Comments)   Prozac [Fluoxetine Hcl] Itching    Past Medical History, Surgical history, Social history, and Family History were reviewed and updated.  Review of Systems: Review of Systems  Constitutional: Negative.   HENT: Negative.    Eyes: Negative.   Respiratory:  Positive for shortness of  breath.   Cardiovascular:  Positive for chest pain.  Gastrointestinal: Negative.   Genitourinary: Negative.   Musculoskeletal:  Positive for back pain.  Skin: Negative.   Neurological: Negative.   Endo/Heme/Allergies: Negative.   Psychiatric/Behavioral: Negative.      Physical Exam:  height is '5\' 10"'$  (1.778 m) and weight is 228 lb 12 oz (103.8 kg). His oral temperature is 97.7 F (36.5 C). His pulse is 97. His respiration is 18 and oxygen saturation is 95%.   Physical Exam Vitals reviewed.  HENT:     Head: Normocephalic and atraumatic.  Eyes:     Pupils: Pupils are equal, round, and reactive to light.  Cardiovascular:     Rate and Rhythm: Normal rate and regular rhythm.     Heart sounds: Normal heart sounds.  Pulmonary:     Effort: Pulmonary effort is normal.     Breath sounds: Normal breath sounds.  Abdominal:     General: Bowel sounds are normal.     Palpations: Abdomen is soft.  Musculoskeletal:        General: No tenderness or deformity. Normal range of motion.     Cervical back: Normal range of motion.  Lymphadenopathy:     Cervical: No cervical adenopathy.  Skin:    General: Skin is warm and dry.     Findings: No erythema or rash.  Neurological:     Mental Status: He is alert and oriented to person, place, and time.  Psychiatric:        Behavior: Behavior normal.        Thought Content: Thought content normal.        Judgment: Judgment normal.     Lab Results  Component Value Date   WBC 7.2 05/25/2022   HGB 12.8 (L) 05/25/2022   HCT 40.5 05/25/2022   MCV 111.0 (H) 05/25/2022   PLT 533 (H) 05/25/2022     Chemistry      Component Value Date/Time   NA 140 04/28/2022 0548   NA 143 03/23/2022 1633   NA 141 04/10/2017 0923   NA 140 04/05/2016 0739   K 4.2 04/28/2022 0548   K 3.5 04/10/2017 0923   K 4.2 04/05/2016 0739   CL 105 04/28/2022 0548   CL 108 04/10/2017 0923   CO2 22 04/28/2022 0548   CO2 24 04/10/2017 0923   CO2 20 (L) 04/05/2016 0739    BUN 27 (H) 04/28/2022 0548   BUN 19 03/23/2022 1633   BUN 12 04/10/2017 0923   BUN 14.7 04/05/2016 0739   CREATININE 1.13 04/28/2022 0548   CREATININE 1.16 03/24/2022 0746   CREATININE 1.1 04/10/2017 0923   CREATININE 1.1 04/05/2016 0739      Component Value Date/Time   CALCIUM 9.3 04/28/2022 0548   CALCIUM 8.4 04/10/2017 0923   CALCIUM 9.0 04/05/2016 0739   ALKPHOS 61 03/24/2022 0746   ALKPHOS 77 04/10/2017 0923   ALKPHOS 91 04/05/2016 0739   AST 12 (L) 03/24/2022 0746   AST 34 04/05/2016 0739   ALT 7 03/24/2022 0746   ALT 32 04/10/2017 0923   ALT 40 04/05/2016 0739   BILITOT 0.8 03/24/2022 0746   BILITOT 0.48 04/05/2016 0739      Impression and Plan: William Villanueva is 72 year old gentleman with polycythemia vera.  Everything is doing okay from my point of view.  We have not had to phlebotomize him  for good 5 years.    The weight loss I think is really critical for him.  I am so happy that he has been losing weight.  This will help him out in many ways.  His platelet count is holding steady.  I will not change the Hydrea dose.  Will now plan to get him back in the Spring.  His birthday comes up in [birth year].  I am sure that he will have a wonderful birthday.   Volanda Napoleon, MD 1/18/20248:09 AM

## 2022-05-26 ENCOUNTER — Other Ambulatory Visit (HOSPITAL_COMMUNITY): Payer: Self-pay

## 2022-05-29 ENCOUNTER — Ambulatory Visit: Payer: PPO | Attending: Internal Medicine

## 2022-06-02 ENCOUNTER — Telehealth: Payer: Self-pay | Admitting: Internal Medicine

## 2022-06-02 NOTE — Telephone Encounter (Signed)
I tried to call [facility] with dental office back, but no answer. I do not see that we received a clearance request.

## 2022-06-02 NOTE — Telephone Encounter (Signed)
Follow Up:     Hope is calling back to check on the status of patient's clearance that was sent over on 05-29-22.

## 2022-06-03 ENCOUNTER — Emergency Department (HOSPITAL_BASED_OUTPATIENT_CLINIC_OR_DEPARTMENT_OTHER): Payer: PPO

## 2022-06-03 ENCOUNTER — Other Ambulatory Visit: Payer: Self-pay

## 2022-06-03 ENCOUNTER — Encounter (HOSPITAL_BASED_OUTPATIENT_CLINIC_OR_DEPARTMENT_OTHER): Payer: Self-pay | Admitting: Emergency Medicine

## 2022-06-03 ENCOUNTER — Emergency Department (HOSPITAL_BASED_OUTPATIENT_CLINIC_OR_DEPARTMENT_OTHER)
Admission: EM | Admit: 2022-06-03 | Discharge: 2022-06-03 | Disposition: A | Discharge status: Home / Self Care | Payer: PPO | Attending: Emergency Medicine | Admitting: Emergency Medicine

## 2022-06-03 DIAGNOSIS — I2510 Atherosclerotic heart disease of native coronary artery without angina pectoris: Secondary | ICD-10-CM | POA: Insufficient documentation

## 2022-06-03 DIAGNOSIS — Z7951 Long term (current) use of inhaled steroids: Secondary | ICD-10-CM | POA: Insufficient documentation

## 2022-06-03 DIAGNOSIS — Z79899 Other long term (current) drug therapy: Secondary | ICD-10-CM | POA: Diagnosis not present

## 2022-06-03 DIAGNOSIS — D649 Anemia, unspecified: Secondary | ICD-10-CM | POA: Diagnosis not present

## 2022-06-03 DIAGNOSIS — Z20822 Contact with and (suspected) exposure to covid-19: Secondary | ICD-10-CM | POA: Insufficient documentation

## 2022-06-03 DIAGNOSIS — E871 Hypo-osmolality and hyponatremia: Secondary | ICD-10-CM | POA: Insufficient documentation

## 2022-06-03 DIAGNOSIS — J440 Chronic obstructive pulmonary disease with acute lower respiratory infection: Secondary | ICD-10-CM | POA: Diagnosis not present

## 2022-06-03 DIAGNOSIS — J449 Chronic obstructive pulmonary disease, unspecified: Secondary | ICD-10-CM | POA: Insufficient documentation

## 2022-06-03 DIAGNOSIS — M545 Low back pain, unspecified: Secondary | ICD-10-CM | POA: Diagnosis not present

## 2022-06-03 DIAGNOSIS — S32049A Unspecified fracture of fourth lumbar vertebra, initial encounter for closed fracture: Secondary | ICD-10-CM

## 2022-06-03 DIAGNOSIS — J181 Lobar pneumonia, unspecified organism: Secondary | ICD-10-CM | POA: Insufficient documentation

## 2022-06-03 DIAGNOSIS — I10 Essential (primary) hypertension: Secondary | ICD-10-CM | POA: Diagnosis not present

## 2022-06-03 DIAGNOSIS — Z794 Long term (current) use of insulin: Secondary | ICD-10-CM | POA: Insufficient documentation

## 2022-06-03 DIAGNOSIS — K760 Fatty (change of) liver, not elsewhere classified: Secondary | ICD-10-CM | POA: Diagnosis not present

## 2022-06-03 DIAGNOSIS — W1839XA Other fall on same level, initial encounter: Secondary | ICD-10-CM | POA: Diagnosis not present

## 2022-06-03 DIAGNOSIS — J168 Pneumonia due to other specified infectious organisms: Secondary | ICD-10-CM | POA: Diagnosis not present

## 2022-06-03 DIAGNOSIS — R944 Abnormal results of kidney function studies: Secondary | ICD-10-CM | POA: Diagnosis not present

## 2022-06-03 DIAGNOSIS — W19XXXA Unspecified fall, initial encounter: Secondary | ICD-10-CM

## 2022-06-03 DIAGNOSIS — S3992XA Unspecified injury of lower back, initial encounter: Secondary | ICD-10-CM | POA: Diagnosis present

## 2022-06-03 DIAGNOSIS — J189 Pneumonia, unspecified organism: Secondary | ICD-10-CM

## 2022-06-03 DIAGNOSIS — R109 Unspecified abdominal pain: Secondary | ICD-10-CM | POA: Diagnosis not present

## 2022-06-03 DIAGNOSIS — Z043 Encounter for examination and observation following other accident: Secondary | ICD-10-CM | POA: Diagnosis not present

## 2022-06-03 HISTORY — DX: Type 2 diabetes mellitus without complications: E11.9

## 2022-06-03 LAB — URINALYSIS, ROUTINE W REFLEX MICROSCOPIC
Bilirubin Urine: NEGATIVE
Glucose, UA: NEGATIVE mg/dL
Hgb urine dipstick: NEGATIVE
Ketones, ur: NEGATIVE mg/dL
Leukocytes,Ua: NEGATIVE
Nitrite: NEGATIVE
Protein, ur: NEGATIVE mg/dL
Specific Gravity, Urine: 1.02 (ref 1.005–1.030)
pH: 5.5 (ref 5.0–8.0)

## 2022-06-03 LAB — TROPONIN I (HIGH SENSITIVITY)
Troponin I (High Sensitivity): 20 ng/L — ABNORMAL HIGH (ref <18)
Troponin I (High Sensitivity): 19 ng/L — ABNORMAL HIGH (ref <18)

## 2022-06-03 LAB — BASIC METABOLIC PANEL
Anion gap: 8 (ref 5–15)
BUN: 24 mg/dL — ABNORMAL HIGH (ref 8–23)
CO2: 21 mmol/L — ABNORMAL LOW (ref 22–32)
Calcium: 8 mg/dL — ABNORMAL LOW (ref 8.9–10.3)
Chloride: 102 mmol/L (ref 98–111)
Creatinine, Ser: 1.17 mg/dL (ref 0.61–1.24)
GFR, Estimated: >60 mL/min (ref >60)
Glucose, Bld: 145 mg/dL — ABNORMAL HIGH (ref 70–99)
Potassium: 3.8 mmol/L (ref 3.5–5.1)
Sodium: 131 mmol/L — ABNORMAL LOW (ref 135–145)

## 2022-06-03 LAB — CBC
HCT: 35.1 % — ABNORMAL LOW (ref 39.0–52.0)
Hemoglobin: 11.6 g/dL — ABNORMAL LOW (ref 13.0–17.0)
MCH: 35.5 pg — ABNORMAL HIGH (ref 26.0–34.0)
MCHC: 33 g/dL (ref 30.0–36.0)
MCV: 107.3 fL — ABNORMAL HIGH (ref 80.0–100.0)
Platelets: 506 10*3/uL — ABNORMAL HIGH (ref 150–400)
RBC: 3.27 MIL/uL — ABNORMAL LOW (ref 4.22–5.81)
RDW: 13.9 % (ref 11.5–15.5)
WBC: 5.9 10*3/uL (ref 4.0–10.5)
nRBC: 0 % (ref 0.0–0.2)

## 2022-06-03 LAB — RESP PANEL BY RT-PCR (RSV, FLU A&B, COVID)  RVPGX2
Influenza A by PCR: NEGATIVE
Influenza B by PCR: NEGATIVE
Resp Syncytial Virus by PCR: NEGATIVE
SARS Coronavirus 2 by RT PCR: NEGATIVE

## 2022-06-03 LAB — BRAIN NATRIURETIC PEPTIDE: B Natriuretic Peptide: 527 pg/mL — ABNORMAL HIGH (ref 0.0–100.0)

## 2022-06-03 MED ORDER — FUROSEMIDE 10 MG/ML IJ SOLN
20.0000 mg | Freq: Once | INTRAMUSCULAR | Status: AC
  Administered 2022-06-03: 20 mg via INTRAVENOUS
  Filled 2022-06-03: qty 2

## 2022-06-03 MED ORDER — MORPHINE SULFATE (PF) 4 MG/ML IV SOLN
4.0000 mg | Freq: Once | INTRAVENOUS | Status: AC
  Administered 2022-06-03: 4 mg via INTRAVENOUS
  Filled 2022-06-03: qty 1

## 2022-06-03 MED ORDER — DOXYCYCLINE HYCLATE 100 MG PO TABS
100.0000 mg | ORAL_TABLET | Freq: Once | ORAL | Status: AC
  Administered 2022-06-03: 100 mg via ORAL
  Filled 2022-06-03: qty 1

## 2022-06-03 MED ORDER — AMOXICILLIN-POT CLAVULANATE 875-125 MG PO TABS: 1.0000 | ORAL_TABLET | Freq: Two times a day (BID) | ORAL | 0 refills | Status: AC

## 2022-06-03 MED ORDER — ONDANSETRON HCL 4 MG/2ML IJ SOLN
4.0000 mg | Freq: Once | INTRAMUSCULAR | Status: AC
  Administered 2022-06-03: 4 mg via INTRAVENOUS
  Filled 2022-06-03: qty 2

## 2022-06-03 MED ORDER — LIDOCAINE 5 % EX PTCH
2.0000 | MEDICATED_PATCH | CUTANEOUS | Status: DC
  Administered 2022-06-03: 2 via TRANSDERMAL
  Filled 2022-06-03: qty 2

## 2022-06-03 MED ORDER — SODIUM CHLORIDE 0.9 % IV SOLN
1.0000 g | Freq: Once | INTRAVENOUS | Status: AC
  Administered 2022-06-03: 1 g via INTRAVENOUS
  Filled 2022-06-03: qty 10

## 2022-06-03 MED ORDER — IOHEXOL 300 MG/ML  SOLN
100.0000 mL | Freq: Once | INTRAMUSCULAR | Status: AC | PRN
  Administered 2022-06-03: 100 mL via INTRAVENOUS

## 2022-06-03 MED ORDER — OXYCODONE HCL 5 MG PO TABS: 5.0000 mg | ORAL_TABLET | Freq: Four times a day (QID) | ORAL | 0 refills | Status: DC | PRN

## 2022-06-03 MED ORDER — IPRATROPIUM-ALBUTEROL 0.5-2.5 (3) MG/3ML IN SOLN
3.0000 mL | Freq: Once | RESPIRATORY_TRACT | Status: AC
  Administered 2022-06-03: 3 mL via RESPIRATORY_TRACT
  Filled 2022-06-03: qty 3

## 2022-06-03 MED ORDER — DOXYCYCLINE HYCLATE 100 MG PO CAPS: 100.0000 mg | ORAL_CAPSULE | Freq: Two times a day (BID) | ORAL | 0 refills | Status: AC

## 2022-06-03 NOTE — ED Notes (Addendum)
Pt c/o L flank pain r/t a recent fall.  Pt reports several recent fall.  Pt had a fall in December and was told his defibrillator had fired.   Pt reports pain subsides w/ topical pain reliever.

## 2022-06-03 NOTE — ED Notes (Addendum)
Pt's oxygen continues to drop into low 80s.  Pt verbalized understanding of hypoxia, but refused to be reassessed EDP  Pt's wife reports she will continue to check is O2 throughout the night and will bring him back if it continues to be low.    Pt verbalized understanding of discharge instructions. Opportunity for questions provided.   Pt attempted to sign for discharge, but pad would not work.

## 2022-06-03 NOTE — ED Notes (Signed)
Pacemaker report placed in file to be sent to medical records.

## 2022-06-03 NOTE — ED Notes (Signed)
Ambulated patient Oxygen at 91 After walking oxygen at 88

## 2022-06-03 NOTE — ED Notes (Signed)
[employer] at bedside.

## 2022-06-03 NOTE — ED Notes (Signed)
Called answering service for [facility]. Will send tech for brace fitting

## 2022-06-03 NOTE — ED Triage Notes (Signed)
Pt arrives pov, slow gait with cane, endorses fall afater "legs felt weak" x 3 days pta. Pt c/o lower back pain, and then LT lower back pain x 2 days. Denies dysuria and denies thinners. Endorses 2x tylenol pta

## 2022-06-03 NOTE — Progress Notes (Signed)
Patient ID: William Villanueva, male   DOB: [birth year], 72 y.o.   MRN: [id number] Films reviewed. Minimal compromise of L4. Only needs an LSO to be worn when out of bed. Can follow up as outpatient. In approximately 2 weeks.

## 2022-06-03 NOTE — Discharge Instructions (Addendum)
You have been seen in the Emergency Department (ED) today for a fall.  Your CT scan showed evidence of spine fracture of L4. This does not require surgery.  You can wear the brace as needed for support and pain control.  You can take Tylenol and ibuprofen or over-the-counter home medicines for pain control but if pain is still severe you can take the oxycodone as needed for severe pain.  Do not take while drinking alcohol.  This medicine can make you drowsy.  You can also buy over-the-counter lidocaine patches to help with pain control.  Dr. Christella Noa is a spinal surgeon who you can make an appointment with to follow-up with outpatient.  Additionally, your CT scan had findings suggestive of possible pneumonia.  You have been prescribed a course of two different antibiotics for your symptoms which you should take exactly as directed. Finish all of your antibiotics even if your symptoms begin to resolve before then.   - Drink lots of water and fluids.  - Adding honey or lemon juice to hot water or tea may help ease a cough. - Sit and sleep slightly propped up on pillows to help you sleep.  - Cough drops will not help your cough, but you can use them if they make you feel better. - Do not smoke. Avoid secondhand smoke.   Please follow up with your primary care doctor in the next week for re-evaluation after today's visit and continuing care.  Return to the ED if your pain worsens or fails to improve, or you experience new or worsening fevers, trouble breathing, worsening oxygen levels, fainting, severe chest pain, blood in your sputum, or other concerning symptoms.

## 2022-06-03 NOTE — ED Notes (Signed)
1 blood draw attempt No pull back  Patient difficult stick

## 2022-06-03 NOTE — ED Provider Notes (Signed)
[facility] Provider Note   CSN: [id number] Arrival date & time: 06/03/22  1020     History  Chief Complaint  Patient presents with   Fall   Back Pain    William Villanueva is a 72 y.o. male.  With PMH of COPD, CAD, polycythemia vera, dilated cardiomyopathy not on any active anticoagulation presenting with lower back and left flank plain after feeling generalized fatigue and weakness and feeling like his legs gave out on him and falling on the floor a couple days prior. Did not have head trauma or LOC. He has been ambulatory since with his cane which he typically uses.  Denies any radiation down his legs, no loss of sensation, numbness, weakness or tingling, no paresthesias, loss of bladder or bowels.  He has had no vomiting or fevers.  He denies any chest pain, shortness of breath, lightheadedness prior to the fall.  He notes a cough that has been ongoing notes a recent history of RSV about a month or 2 prior however he has had return of cough.  He was previously recently on oxygen but recently came off.  Denies any increase of weight or change in leg pain or swelling.  Does not feel like his ICD went off.  However did have a previous fall about a month or 2 prior and at that time he had noted his ICD went off.   Fall  Back Pain      Home Medications Prior to Admission medications   Medication Sig Start Date End Date Taking? Authorizing Provider  amoxicillin-clavulanate (AUGMENTIN) 875-125 MG tablet Take 1 tablet by mouth every 12 (twelve) hours for 7 days. 06/03/22 06/10/22 Yes Elgie Congo, MD  doxycycline (VIBRAMYCIN) 100 MG capsule Take 1 capsule (100 mg total) by mouth 2 (two) times daily for 7 days. 06/03/22 06/10/22 Yes Elgie Congo, MD  oxyCODONE (ROXICODONE) 5 MG immediate release tablet Take 1 tablet (5 mg total) by mouth every 6 (six) hours as needed for up to 10 doses for severe pain. 06/03/22  Yes Elgie Congo, MD   acetaminophen (TYLENOL) 500 MG tablet Take 1,000 mg by mouth every 6 (six) hours as needed for headache. Patient not taking: Reported on 05/25/2022    [provider]  allopurinol (ZYLOPRIM) 300 MG tablet Take 1 tablet (300 mg total) by mouth daily. 04/10/22   Collier Salina, MD  budesonide (PULMICORT) 0.5 MG/2ML nebulizer solution USE 1 VIAL VIA NEBULIZER TWICE  DAILY (RINSE MOUTH AFTER USE) 05/04/22   Tanda Rockers, MD  carvedilol (COREG) 3.125 MG tablet Take 1 tablet (3.125 mg total) by mouth 2 (two) times daily with a meal. 05/02/22   Deboraha Sprang, MD  cholestyramine Lucrezia Starch) 4 GM/DOSE powder Take 1 g by mouth 2 (two) times daily as needed (diarrhea). 03/22/22   [provider]  citalopram (CELEXA) 10 MG tablet Take 5 mg by mouth in the morning. 03/01/22   [provider]  Cyanocobalamin (B-12 PO) Take 1 tablet by mouth daily.    [provider]  diclofenac Sodium (VOLTAREN) 1 % GEL Apply 2 g topically 4 (four) times daily. Apply to left shoulder for pain. Patient taking differently: Apply 2 g topically daily as needed (Apply to left shoulder for pain.). 02/14/22   Angelique Blonder, DO  dicyclomine (BENTYL) 20 MG tablet Take 20 mg by mouth 2 (two) times daily. 03/28/22   [provider]  famotidine (PEPCID) 20 MG  tablet One after supper 04/06/22   Tanda Rockers, MD  folic acid (FOLVITE) 1 MG tablet Take 1 mg by mouth in the morning.    [provider]  furosemide (LASIX) 40 MG tablet Take 2 tablets (80 mg total) by mouth daily. Patient taking differently: Take 40-80 mg by mouth in the morning. 04/01/22   Fransico Meadow, MD  hydroxyurea (HYDREA) 500 MG capsule Take 2 capsules (1,000 mg total) by mouth daily. May take with food to minimize GI side effects. Patient taking differently: Take 500 mg by mouth 2 (two) times daily. May take with food to minimize GI side effects. 04/06/22   Volanda Napoleon, MD  insulin degludec  (TRESIBA FLEXTOUCH) 100 UNIT/ML SOPN FlexTouch Pen Inject 20 Units into the skin daily after breakfast.     [provider]  ipratropium-albuterol (DUONEB) 0.5-2.5 (3) MG/3ML SOLN Inhale one vial in nebulizer twice daily and in between if needed 05/18/22   Tanda Rockers, MD  ketoconazole (NIZORAL) 2 % cream Apply 1 Application topically daily. 12/22/21   [provider]  losartan (COZAAR) 25 MG tablet Take 1 tablet (25 mg total) by mouth daily. Patient taking differently: Take 12.5 mg by mouth daily. 02/10/22 04/26/24  Arrien, Jimmy Picket, MD  Magnesium Oxide 400 MG CAPS Take 2 capsules (800 mg total) by mouth in the morning and at bedtime. Patient taking differently: Take 400 mg by mouth in the morning and at bedtime. 04/26/20   Deboraha Sprang, MD  omeprazole (PRILOSEC) 20 MG capsule Take 20 mg by mouth in the morning and at bedtime.    [provider]  oxybutynin (DITROPAN XL) 15 MG 24 hr tablet Take 15 mg by mouth in the morning. 02/06/22   [provider]  pantoprazole (PROTONIX) 40 MG tablet Take 1 tablet (40 mg total) by mouth daily. Take 30-60 min before first meal of the day 04/06/22   Tanda Rockers, MD  potassium chloride SA (KLOR-CON M) 20 MEQ tablet Take 1 tablet (20 mEq total) by mouth daily for 5 days. 04/01/22 04/28/22  Fransico Meadow, MD  prednisoLONE acetate (PRED FORTE) 1 % ophthalmic suspension Place 1 drop into both eyes daily as needed (eye irritation.).    [provider]  rosuvastatin (CRESTOR) 5 MG tablet Take 5 mg by mouth in the morning.    [provider]  spironolactone (ALDACTONE) 25 MG tablet Take 0.5 tablets (12.5 mg total) by mouth daily. 04/18/22   Lelon Perla, MD  tamsulosin (FLOMAX) 0.4 MG CAPS capsule Take 0.4 mg by mouth 2 (two) times daily.  08/03/17   [provider]  Turmeric (QC TUMERIC COMPLEX) 500 MG CAPS Take 1 tablet by mouth daily.    [provider]  zolpidem (AMBIEN) 5  MG tablet Take 1 tablet (5 mg total) by mouth at bedtime as needed for sleep. Patient not taking: Reported on 05/25/2022 02/10/22   Arrien, Jimmy Picket, MD      Allergies    Advil [ibuprofen], Keflex [cephalexin], Wellbutrin [bupropion], Prednisone, Temazepam, Trazodone and nefazodone, Sacubitril-valsartan, and Prozac [fluoxetine hcl]    Review of Systems   Review of Systems  Musculoskeletal:  Positive for back pain.    Physical Exam Updated Vital Signs BP 102/61   Pulse 79   Temp 97.7 F (36.5 C) (Oral)   Resp 12   Wt 101.2 kg   SpO2 (!) 88%   BMI 32.00 kg/m  Physical Exam Constitutional: Alert and oriented.chronically  ill NAD Eyes: Conjunctivae are normal. ENT      Head: Normocephalic and atraumatic.      Neck: No stridor. No midline ttp Cardiovascular: S1, S2,  regular rate, warm and well perfused Respiratory: Normal respiratory effort. Mild faint crackles, generally clear, O2 sat low 90s on RA Gastrointestinal: Soft and mild LLQ ttp and left flank, not peritonitic Musculoskeletal: Normal range of motion in all extremities. Lower midline L spine ttp and left lumbar paraspinal ttp Trace edema Neurologic: Normal speech and language. No drift of b/l UE and LE. CN 2-12 grossly intact, no truncal ataxia with gait. Normal finger to nose. Sensation grossly intact. No gross focal neurologic deficits are appreciated. Skin: Skin is warm, dry and intact. No rash noted. Psychiatric: Mood and affect are normal. Speech and behavior are normal.  ED Results / Procedures / Treatments   Labs (all labs ordered are listed, but only abnormal results are displayed) Labs Reviewed  BASIC METABOLIC PANEL - Abnormal; Notable for the following components:      Result Value   Sodium 131 (*)    CO2 21 (*)    Glucose, Bld 145 (*)    BUN 24 (*)    Calcium 8.0 (*)    All other components within normal limits  CBC - Abnormal; Notable for the following components:   RBC 3.27 (*)    Hemoglobin  11.6 (*)    HCT 35.1 (*)    MCV 107.3 (*)    MCH 35.5 (*)    Platelets 506 (*)    All other components within normal limits  BRAIN NATRIURETIC PEPTIDE - Abnormal; Notable for the following components:   B Natriuretic Peptide 527.0 (*)    All other components within normal limits  TROPONIN I (HIGH SENSITIVITY) - Abnormal; Notable for the following components:   Troponin I (High Sensitivity) 20 (*)    All other components within normal limits  TROPONIN I (HIGH SENSITIVITY) - Abnormal; Notable for the following components:   Troponin I (High Sensitivity) 19 (*)    All other components within normal limits  RESP PANEL BY RT-PCR (RSV, FLU A&B, COVID)  [id number]  URINALYSIS, ROUTINE W REFLEX MICROSCOPIC    EKG EKG Interpretation  Date/Time:  Saturday June 03 2022 10:46:38 EST Ventricular Rate:  94 PR Interval:  205 QRS Duration: 121 QT Interval:  406 QTC Calculation: 508 R Axis:   -67 Text Interpretation: Sinus rhythm Atrial premature complexes Nonspecific IVCD with LAD Probable inferior infarct, acute Probable anterolateral infarct, age indeterm Very minimal ST elevation inferior leads which looks generally unchanged from prior T wave inversion lateral leads unchanged T flattening V2 Confirmed by Georgina Snell [phone]) on 06/03/2022 10:50:54 AM  Radiology CT L-SPINE NO CHARGE  Result Date: 06/03/2022 CLINICAL DATA:  Fall 3 days ago. Pain extending into the left lower extremity for 2 days. EXAM: CT LUMBAR SPINE WITHOUT CONTRAST TECHNIQUE: Multidetector CT imaging of the lumbar spine was performed without intravenous contrast administration. Multiplanar CT image reconstructions were also generated. RADIATION DOSE REDUCTION: This exam was performed according to the departmental dose-optimization program which includes automated exposure control, adjustment of the mA and/or kV according to patient size and/or use of iterative reconstruction technique. COMPARISON:  CT abdomen and pelvis  with contrast 07/08/2021. Lumbar myelogram 07/10/2017. FINDINGS: Segmentation: 5 non rib-bearing lumbar type vertebral bodies are present. The lowest fully formed vertebral body is L5. Alignment: No significant listhesis is present. Mild straightening of the normal lumbar lordosis is stable. Vertebrae: Nondisplaced  fracture is present within anterior osteophytes extending from the superior endplate of L4. No additional fractures are present. Vertebral body heights are maintained. The visualized sacrum and pelvis are within normal limits. Paraspinal and other soft tissues: Minimal stranding is noted adjacent the fracture. No significant hematoma is present. No other focal paraspinous soft tissue abnormalities are present. Disc levels: Progressive multilevel degenerative changes are present. Foraminal stenosis is greatest at L4-5 and L5-S1. IMPRESSION: 1. Nondisplaced fracture within anterior osteophytes extending from the superior endplate of L4. 2. No additional fractures. 3. Progressive multilevel degenerative changes of the lumbar spine. Electronically Signed   By: San Morelle M.D.   On: 06/03/2022 14:25   CT ABDOMEN PELVIS W CONTRAST  Result Date: 06/03/2022 CLINICAL DATA:  Fall 3 days ago with low back pain. Left lower extremity pain for 2 days. EXAM: CT ABDOMEN AND PELVIS WITH CONTRAST TECHNIQUE: Multidetector CT imaging of the abdomen and pelvis was performed using the standard protocol following bolus administration of intravenous contrast. RADIATION DOSE REDUCTION: This exam was performed according to the departmental dose-optimization program which includes automated exposure control, adjustment of the mA and/or kV according to patient size and/or use of iterative reconstruction technique. CONTRAST:  142m OMNIPAQUE IOHEXOL 300 MG/ML  SOLN COMPARISON:  CT of the abdomen and pelvis 07/08/2021 FINDINGS: Lower chest: Patchy airspace opacities are present in the lower lobes bilaterally similar  findings are present in the visualized left upper lobe. The heart size is normal. Extensive coronary artery calcifications are present. Hepatobiliary: Fatty infiltration of the liver is noted. No discrete lesions are present. The common bile duct and gallbladder are normal. Pancreas: Diffuse fatty atrophy of the pancreatic head and uncinate process again seen. No acute abnormalities are present. Spleen: The spleen is enlarged and has increased in size since the prior exam, now measuring 18 cm cephalo caudad. No discrete lesions are present. Adrenals/Urinary Tract: The adrenal glands are normal bilaterally. Simple cysts in both kidneys are similar the prior studies. Recommend no imaging follow-up. No solid mass lesion is present. No stone or obstruction is present. The ureters are within normal limits bilaterally. Urinary bladder is normal. Stomach/Bowel: Stomach and duodenum are within normal limits. The small bowel is unremarkable. Terminal ileum is normal. The appendix is visualized and within normal limits. The ascending and transverse colon are within normal limits. Diverticular changes are present the distal descending and sigmoid colon without focal inflammation. The rectosigmoid colon is normal. Vascular/Lymphatic: Atherosclerotic calcifications are present in the aorta and branch vessels. No aneurysm is present. No significant retroperitoneal adenopathy is present. Reproductive: Prostate is enlarged, similar the prior study. This creates some mass effect on the floor the bladder. Seminal vesicles are within normal limits. Other: No abdominal wall hernia or abnormality. No abdominopelvic ascites. Musculoskeletal: Vertebral body heights are maintained. An acute fracture is present within the anterior osteophyte of L4. See dedicated lumbar spine CT report for further detail. Vertebral body heights are maintained. No acute or focal osseous lesions are present. Degenerative changes are present at the SI joints.  Sacrum bony pelvis are intact. Hips are located. Mild degenerative changes are present. No acute abnormalities are present. IMPRESSION: 1. Acute fracture within the anterior osteophyte of L4. See dedicated lumbar spine CT report for further detail. 2. No other acute or focal lesion to explain the patient's symptoms. 3. Hepatic steatosis. 4. Splenomegaly, increased in size since the prior exam, now measuring 18 cm cephalo caudad. 5. Simple cysts in both kidneys are similar the prior  studies. Recommend no imaging follow-up. 6. Patchy airspace opacities are present in the lower lobes bilaterally similar findings are present in the visualized left upper lobe. While this may represent atelectasis, infection is not excluded. 7.  Aortic Atherosclerosis (ICD10-I70.0). Electronically Signed   By: San Morelle M.D.   On: 06/03/2022 14:18   DG Chest Port 1 View  Result Date: 06/03/2022 CLINICAL DATA:  Fall EXAM: PORTABLE CHEST 1 VIEW COMPARISON:  04/28/2022 FINDINGS: Left-sided implanted cardiac device remains in place. Stable heart size. Increased perihilar and bibasilar interstitial markings. No pleural effusion. No pneumothorax. IMPRESSION: Increased perihilar and bibasilar interstitial markings, which may reflect pulmonary edema versus atypical/viral infection. Electronically Signed   By: Davina Poke D.O.   On: 06/03/2022 13:15    Procedures Procedures    Medications Ordered in ED Medications  lidocaine (LIDODERM) 5 % 2 patch (2 patches Transdermal Patch Applied 06/03/22 1317)  morphine (PF) 4 MG/ML injection 4 mg (4 mg Intravenous Given 06/03/22 1316)  ondansetron (ZOFRAN) injection 4 mg (4 mg Intravenous Given 06/03/22 1313)  iohexol (OMNIPAQUE) 300 MG/ML solution 100 mL (100 mLs Intravenous Contrast Given 06/03/22 1325)  ipratropium-albuterol (DUONEB) 0.5-2.5 (3) MG/3ML nebulizer solution 3 mL (3 mLs Nebulization Given 06/03/22 1354)  cefTRIAXone (ROCEPHIN) 1 g in sodium chloride 0.9 % 100 mL  IVPB (0 g Intravenous Stopped 06/03/22 1624)  doxycycline (VIBRA-TABS) tablet 100 mg (100 mg Oral Given 06/03/22 1538)  furosemide (LASIX) injection 20 mg (20 mg Intravenous Given 06/03/22 1538)  morphine (PF) 4 MG/ML injection 4 mg (4 mg Intravenous Given 06/03/22 1653)    ED Course/ Medical Decision Making/ A&P Clinical Course as of 06/03/22 2220  Sat Jun 03, 2022  1612 Pacemaker was interrogated by rep.  He had no acute or concerning events leading to fall.  Patient's labs reviewed generally unremarkable for patient.  He has a normal white blood cell count 5.9.  Stable anemia hemoglobin 11.6, stable elevated platelet count 506.  He has mild hyponatremia 131 mildly elevated BUN 24 creatinine 1.17 at baseline.  Initial troponin 20, repeat 19 downtrended from baseline with no active chest pain and no acute changes on EKG, no concern for NSTEMI.  BNP 527 slightly elevated.  CT scans resulted personally evaluated and agree with evidence of L-spine L4 fracture.  He has no neurologic deficits.  He has been ambulatory.  CTAP with contrast showed bilateral infiltrate in the lower lobes concerning for possible atelectasis or pneumonia.  He has been borderline hypoxic.  At rest he is greater than 92% however on ambulation he desatted to 86%.  Patient is adamant he would like to go home.  He does not want to stay in the hospital at this time despite low oxygen when ambulating.  I covered patient for community-acquired pneumonia with Rocephin and doxycycline.  Will discharge patient with Augmentin and doxycycline.  Discussed with patient and wife strict return precautions.  Regarding patient's L4 fracture, Dr. Christella Noa of neurosurgery only recommends lower LSO brace no thoracic extension and pain control.  This is nonsurgical. [VB]    Clinical Course User Index [VB] Elgie Congo, MD   {                            Medical Decision Making  Zebulin Siegel is a 72 y.o. male.  With PMH of COPD, CAD,  polycythemia vera, dilated cardiomyopathy not on any active anticoagulation presenting with lower back and left flank plain  after feeling generalized fatigue and weakness and feeling like his legs gave out on him and falling on the floor a couple days prior. Did not have head trauma or LOC.   Patient's labs reviewed by me generally unremarkable for patient.  He has a normal white blood cell count 5.9.  Stable anemia hemoglobin 11.6, stable elevated platelet count 506.  He has mild hyponatremia 131 mildly elevated BUN 24 creatinine 1.17 at baseline.  Initial troponin 20, repeat 19 downtrended from baseline with no active chest pain and no acute changes on EKG, no concern for NSTEMI.  BNP 527 slightly elevated.  CT scans resulted personally evaluated and agree with evidence of L-spine L4 fracture.  He has no neurologic deficits.  He has been ambulatory.  CTAP with contrast showed bilateral infiltrate in the lower lobes concerning for possible atelectasis or pneumonia.  He has been borderline hypoxic.  At rest he is greater than 92% however on ambulation he desatted to 86%.  Patient is adamant he would like to go home.  He does not want to stay in the hospital at this time despite low oxygen when ambulating.  I covered patient for community-acquired pneumonia with Rocephin and doxycycline.  Will discharge patient with Augmentin and doxycycline.  Discussed with patient and wife strict return precautions.  Regarding patient's L4 fracture, Dr. Christella Noa of neurosurgery only recommends lower LSO brace no thoracic extension and pain control.  This is nonsurgical.   Amount and/or Complexity of Data Reviewed Labs: ordered. Radiology: ordered.  Risk Prescription drug management.    Final Clinical Impression(s) / ED Diagnoses Final diagnoses:  Fall, initial encounter  Acute midline low back pain without sciatica  Closed fracture of fourth lumbar vertebra, unspecified fracture morphology, initial encounter  ([place])  Pneumonia of both lower lobes due to infectious organism    Rx / DC Orders ED Discharge Orders          Ordered    oxyCODONE (ROXICODONE) 5 MG immediate release tablet  Every 6 hours PRN        06/03/22 1559    amoxicillin-clavulanate (AUGMENTIN) 875-125 MG tablet  Every 12 hours        06/03/22 1559    doxycycline (VIBRAMYCIN) 100 MG capsule  2 times daily        06/03/22 1559              Elgie Congo, MD 06/03/22 2220

## 2022-06-03 NOTE — ED Notes (Signed)
This Probation officer spoke to [employer] and he is traveling from [place] to interrogate the pacemaker.  Pt is adamant he does not have an app on his phone to transmit information.

## 2022-06-03 NOTE — ED Notes (Signed)
This Probation officer called [employer].  Local rep is being paged for a Stat interrogation.

## 2022-06-05 NOTE — Progress Notes (Signed)
No ICM remote transmission received for 05/29/2022 and next ICM transmission scheduled for 06/19/2022.

## 2022-06-06 ENCOUNTER — Telehealth: Payer: Self-pay | Admitting: Internal Medicine

## 2022-06-06 NOTE — Telephone Encounter (Signed)
I was able to reach the [facility] office today. They will fax over a clearance to fax # [phone] attn: pre op team. Was stated to me that the procedure looks to be for 1 tooth to be extracted under local, though this will be confirmed once I have the fax.

## 2022-06-06 NOTE — Telephone Encounter (Signed)
Called and spoke with patient, he states that he has a break in his back and he is in a brace.  When his wife puts on the brace and tightens it his sats drop down to 86% briefly and go back up to 92-97%.  He said he is really not having any breathing problems at this time and does not feel like he needs to be seen in the office.  I advised him to call us if anything changes and we will be glad to see him or if necessary return to the ED.  He verbalized understanding.    Dr. Melvyn Novas, patient did  not feel like he needed to be seen in the office.  Just FYI.  Thank you.

## 2022-06-06 NOTE — Telephone Encounter (Signed)
Ok to add on to Friday pm schedule in [place] if wants to see me or NP's in [place] but if breathing is getting worse in meantime just needs to go back to ER as this would suggest it is due to trauma which we can't evaluate acutely in the office.

## 2022-06-06 NOTE — Telephone Encounter (Signed)
ATC Katie, she was on the line with a patient.  I advised Mickel Baas that I would touch base with Dr. Melvyn Novas to see if he needs to be seen in our office.  Dr. Melvyn Novas, It looks like the patient was seen in the ED on 1/27/4 after a fall.  It looks like he was last seen in our office on 05/18/22.  Do you want [place] to get him scheduled for an OV with you?

## 2022-06-06 NOTE — Telephone Encounter (Signed)
Right now he is in for a 6 months recall.  Please advise if you would like to see him sooner.  Thank you.

## 2022-06-07 NOTE — Telephone Encounter (Signed)
   Pre-operative Risk Assessment    Patient Name: William Villanueva  DOB: 04-Feb-1951 MRN: [id number]     Request for Surgical Clearance    Procedure:  Dental Extraction - Amount of Teeth to be Pulled:  1 #19  Date of Surgery:  Clearance TBD                                 Surgeon:  None listed Surgeon's Group or Practice Name:  [facility] Phone number:  [phone] Fax number:  [phone]   Type of Clearance Requested:   - Medical    Type of Anesthesia:  Not Indicated   Additional requests/questions:    SignedJacinta Shoe   06/07/2022, 5:18 PM

## 2022-06-07 NOTE — Telephone Encounter (Signed)
Patient calling to follow up on whether clearance was received.

## 2022-06-08 DIAGNOSIS — I5042 Chronic combined systolic (congestive) and diastolic (congestive) heart failure: Secondary | ICD-10-CM | POA: Diagnosis not present

## 2022-06-08 DIAGNOSIS — S32049D Unspecified fracture of fourth lumbar vertebra, subsequent encounter for fracture with routine healing: Secondary | ICD-10-CM | POA: Diagnosis not present

## 2022-06-08 DIAGNOSIS — J189 Pneumonia, unspecified organism: Secondary | ICD-10-CM | POA: Diagnosis not present

## 2022-06-08 DIAGNOSIS — Z9181 History of falling: Secondary | ICD-10-CM | POA: Diagnosis not present

## 2022-06-08 DIAGNOSIS — R5381 Other malaise: Secondary | ICD-10-CM | POA: Diagnosis not present

## 2022-06-09 NOTE — Telephone Encounter (Signed)
   Patient Name: William Villanueva  DOB: 05/30/1950 MRN: [id number]  Primary Cardiologist: Kirk Ruths, MD  Chart reviewed as part of pre-operative protocol coverage.   Simple dental extractions (i.e. 1-2 teeth) are considered low risk procedures per guidelines and generally do not require any specific cardiac clearance. It is also generally accepted that for simple extractions and dental cleanings, there is no need to interrupt blood thinner therapy.  SBE prophylaxis is not required for the patient from a cardiac standpoint.  I will route this recommendation to the requesting party via Epic fax function and remove from pre-op pool.  Please call with questions.  [place], [place] 06/09/2022, 1:44 PM

## 2022-06-11 DIAGNOSIS — Z794 Long term (current) use of insulin: Secondary | ICD-10-CM | POA: Diagnosis not present

## 2022-06-11 DIAGNOSIS — E1122 Type 2 diabetes mellitus with diabetic chronic kidney disease: Secondary | ICD-10-CM | POA: Diagnosis not present

## 2022-06-11 DIAGNOSIS — Z87891 Personal history of nicotine dependence: Secondary | ICD-10-CM | POA: Diagnosis not present

## 2022-06-11 DIAGNOSIS — I130 Hypertensive heart and chronic kidney disease with heart failure and stage 1 through stage 4 chronic kidney disease, or unspecified chronic kidney disease: Secondary | ICD-10-CM | POA: Diagnosis not present

## 2022-06-11 DIAGNOSIS — K5900 Constipation, unspecified: Secondary | ICD-10-CM | POA: Diagnosis not present

## 2022-06-11 DIAGNOSIS — M1A30X0 Chronic gout due to renal impairment, unspecified site, without tophus (tophi): Secondary | ICD-10-CM | POA: Diagnosis not present

## 2022-06-11 DIAGNOSIS — J189 Pneumonia, unspecified organism: Secondary | ICD-10-CM | POA: Diagnosis not present

## 2022-06-11 DIAGNOSIS — K219 Gastro-esophageal reflux disease without esophagitis: Secondary | ICD-10-CM | POA: Diagnosis not present

## 2022-06-11 DIAGNOSIS — N183 Chronic kidney disease, stage 3 unspecified: Secondary | ICD-10-CM | POA: Diagnosis not present

## 2022-06-11 DIAGNOSIS — I441 Atrioventricular block, second degree: Secondary | ICD-10-CM | POA: Diagnosis not present

## 2022-06-11 DIAGNOSIS — S32049D Unspecified fracture of fourth lumbar vertebra, subsequent encounter for fracture with routine healing: Secondary | ICD-10-CM | POA: Diagnosis not present

## 2022-06-11 DIAGNOSIS — K649 Unspecified hemorrhoids: Secondary | ICD-10-CM | POA: Diagnosis not present

## 2022-06-11 DIAGNOSIS — G4733 Obstructive sleep apnea (adult) (pediatric): Secondary | ICD-10-CM | POA: Diagnosis not present

## 2022-06-11 DIAGNOSIS — W19XXXD Unspecified fall, subsequent encounter: Secondary | ICD-10-CM | POA: Diagnosis not present

## 2022-06-11 DIAGNOSIS — Z9181 History of falling: Secondary | ICD-10-CM | POA: Diagnosis not present

## 2022-06-14 DIAGNOSIS — E538 Deficiency of other specified B group vitamins: Secondary | ICD-10-CM | POA: Diagnosis not present

## 2022-06-14 DIAGNOSIS — Z794 Long term (current) use of insulin: Secondary | ICD-10-CM | POA: Diagnosis not present

## 2022-06-14 DIAGNOSIS — E1169 Type 2 diabetes mellitus with other specified complication: Secondary | ICD-10-CM | POA: Diagnosis not present

## 2022-06-14 DIAGNOSIS — E261 Secondary hyperaldosteronism: Secondary | ICD-10-CM | POA: Diagnosis not present

## 2022-06-14 DIAGNOSIS — E1165 Type 2 diabetes mellitus with hyperglycemia: Secondary | ICD-10-CM | POA: Diagnosis not present

## 2022-06-14 DIAGNOSIS — J449 Chronic obstructive pulmonary disease, unspecified: Secondary | ICD-10-CM | POA: Diagnosis not present

## 2022-06-14 DIAGNOSIS — I4891 Unspecified atrial fibrillation: Secondary | ICD-10-CM | POA: Diagnosis not present

## 2022-06-14 DIAGNOSIS — I110 Hypertensive heart disease with heart failure: Secondary | ICD-10-CM | POA: Diagnosis not present

## 2022-06-14 DIAGNOSIS — E669 Obesity, unspecified: Secondary | ICD-10-CM | POA: Diagnosis not present

## 2022-06-14 DIAGNOSIS — I509 Heart failure, unspecified: Secondary | ICD-10-CM | POA: Diagnosis not present

## 2022-06-14 DIAGNOSIS — M069 Rheumatoid arthritis, unspecified: Secondary | ICD-10-CM | POA: Diagnosis not present

## 2022-06-14 DIAGNOSIS — D6869 Other thrombophilia: Secondary | ICD-10-CM | POA: Diagnosis not present

## 2022-06-15 DIAGNOSIS — N189 Chronic kidney disease, unspecified: Secondary | ICD-10-CM | POA: Diagnosis not present

## 2022-06-15 DIAGNOSIS — D631 Anemia in chronic kidney disease: Secondary | ICD-10-CM | POA: Diagnosis not present

## 2022-06-15 DIAGNOSIS — I129 Hypertensive chronic kidney disease with stage 1 through stage 4 chronic kidney disease, or unspecified chronic kidney disease: Secondary | ICD-10-CM | POA: Diagnosis not present

## 2022-06-15 DIAGNOSIS — R809 Proteinuria, unspecified: Secondary | ICD-10-CM | POA: Diagnosis not present

## 2022-06-15 DIAGNOSIS — N1831 Chronic kidney disease, stage 3a: Secondary | ICD-10-CM | POA: Diagnosis not present

## 2022-06-15 DIAGNOSIS — I428 Other cardiomyopathies: Secondary | ICD-10-CM | POA: Diagnosis not present

## 2022-06-15 DIAGNOSIS — N2581 Secondary hyperparathyroidism of renal origin: Secondary | ICD-10-CM | POA: Diagnosis not present

## 2022-06-19 ENCOUNTER — Ambulatory Visit: Payer: PPO

## 2022-06-19 DIAGNOSIS — I130 Hypertensive heart and chronic kidney disease with heart failure and stage 1 through stage 4 chronic kidney disease, or unspecified chronic kidney disease: Secondary | ICD-10-CM | POA: Diagnosis not present

## 2022-06-19 DIAGNOSIS — S32049D Unspecified fracture of fourth lumbar vertebra, subsequent encounter for fracture with routine healing: Secondary | ICD-10-CM | POA: Diagnosis not present

## 2022-06-19 DIAGNOSIS — Z9181 History of falling: Secondary | ICD-10-CM | POA: Diagnosis not present

## 2022-06-19 DIAGNOSIS — Z87891 Personal history of nicotine dependence: Secondary | ICD-10-CM | POA: Diagnosis not present

## 2022-06-19 DIAGNOSIS — J189 Pneumonia, unspecified organism: Secondary | ICD-10-CM | POA: Diagnosis not present

## 2022-06-19 DIAGNOSIS — N183 Chronic kidney disease, stage 3 unspecified: Secondary | ICD-10-CM | POA: Diagnosis not present

## 2022-06-19 DIAGNOSIS — G4733 Obstructive sleep apnea (adult) (pediatric): Secondary | ICD-10-CM | POA: Diagnosis not present

## 2022-06-19 DIAGNOSIS — I441 Atrioventricular block, second degree: Secondary | ICD-10-CM | POA: Diagnosis not present

## 2022-06-19 DIAGNOSIS — K649 Unspecified hemorrhoids: Secondary | ICD-10-CM | POA: Diagnosis not present

## 2022-06-19 DIAGNOSIS — M1A30X0 Chronic gout due to renal impairment, unspecified site, without tophus (tophi): Secondary | ICD-10-CM | POA: Diagnosis not present

## 2022-06-19 DIAGNOSIS — K5900 Constipation, unspecified: Secondary | ICD-10-CM | POA: Diagnosis not present

## 2022-06-19 DIAGNOSIS — Z794 Long term (current) use of insulin: Secondary | ICD-10-CM | POA: Diagnosis not present

## 2022-06-19 DIAGNOSIS — K219 Gastro-esophageal reflux disease without esophagitis: Secondary | ICD-10-CM | POA: Diagnosis not present

## 2022-06-19 DIAGNOSIS — W19XXXD Unspecified fall, subsequent encounter: Secondary | ICD-10-CM | POA: Diagnosis not present

## 2022-06-19 DIAGNOSIS — E1122 Type 2 diabetes mellitus with diabetic chronic kidney disease: Secondary | ICD-10-CM | POA: Diagnosis not present

## 2022-06-22 DIAGNOSIS — R350 Frequency of micturition: Secondary | ICD-10-CM | POA: Diagnosis not present

## 2022-06-22 DIAGNOSIS — S32009S Unspecified fracture of unspecified lumbar vertebra, sequela: Secondary | ICD-10-CM | POA: Diagnosis not present

## 2022-06-22 NOTE — Progress Notes (Signed)
No ICM remote transmission received for 06/19/2022 and next ICM transmission scheduled for 07/10/2022.

## 2022-06-26 ENCOUNTER — Encounter: Payer: Medicare Other | Admitting: Internal Medicine

## 2022-06-26 DIAGNOSIS — R34 Anuria and oliguria: Secondary | ICD-10-CM | POA: Diagnosis not present

## 2022-06-29 DIAGNOSIS — E1122 Type 2 diabetes mellitus with diabetic chronic kidney disease: Secondary | ICD-10-CM | POA: Diagnosis not present

## 2022-06-29 DIAGNOSIS — I5042 Chronic combined systolic (congestive) and diastolic (congestive) heart failure: Secondary | ICD-10-CM | POA: Diagnosis not present

## 2022-06-29 DIAGNOSIS — N1831 Chronic kidney disease, stage 3a: Secondary | ICD-10-CM | POA: Diagnosis not present

## 2022-06-29 DIAGNOSIS — I10 Essential (primary) hypertension: Secondary | ICD-10-CM | POA: Diagnosis not present

## 2022-07-07 ENCOUNTER — Ambulatory Visit: Payer: Medicare Other | Admitting: Internal Medicine

## 2022-07-07 NOTE — Progress Notes (Deleted)
Office Visit Note  Patient: William Villanueva             Date of Birth: [birth year]           MRN: [id number]             PCP: Antony Contras, MD Referring: Antony Contras, MD Visit Date: 07/07/2022   Subjective:  No chief complaint on file.   History of Present Illness: William Villanueva is a 72 y.o. male here for follow up ***   Previous HPI 04/07/22 William Villanueva is a 72 y.o. male here for follow up for rheumatoid arthritis and gout currently on allopurinol increased to 200 mg daily after our initial visit. He continues having joint pain and stiffness at multiple areas. Most problematic are his right thumb and his left shoulder. Swelling stays mostly at his feet and ankles. He got sick with RSV and delayed his planned pacemaker battery replacement so that is coming up. Still has a bit of increased cough mostly nonproductive.    Previous HPI 02/23/22 William Villanueva is a 72 y.o. male here for rheumatoid arthritis. He was previously seeing [facility] rheumatology on treatment with methotrexate and had been on allopurinol and uloric for gout. He has history of carpal tunnel syndrome with surgical treatment for the left wrist.  Original diagnosis was RA he had joint pain in multiple areas with abnormal laboratory test.  He was started on injectable methotrexate but switched to oral methotrexate due to pain and difficulty with the injections.  He stopped following up with her clinic due to dissatisfaction including never seeing his MD and  not addressing any complaints not directly attributable to his inflammatory arthritis.  As result he has been off any disease specific medication for over a year does feel he has increased in joint pains off of treatment.  He had also been off of the Uloric they were prescribing but was restarted on allopurinol in the past few months with his PCP office.  He reports 1 flare of gout in his foot within the past month but before that had been free of any attacks  for several months duration.  He still has residual swelling and erythema in the left foot but was told this may be coming from his heart failure edema as well.   Generally has daily joint pain in multiple areas particular including bilateral hands and in his back.  He gets foot and ankle pain most severe during gout flares has mild symptoms otherwise.  More recently also developed increased left shoulder pain for several days duration for which he went to urgent care for evaluation of this but ended up going to the hospital due to hypoxia and increased cough so the original problem was never addressed.  Treatment course at the hospital for congestive heart failure exacerbation and improved after several days.  He still had increased cough producing significant discolored sputum and received 1 dose of IV ceftriaxone.  X-ray of the shoulder was obtained demonstrated mild glenohumeral joint osteoarthritis and moderate AC joint osteoarthritis.   Labs reviewed 10/2021 Uric acid 9.7 eGFR 59   No Rheumatology ROS completed.   PMFS History:  Patient Active Problem List   Diagnosis Date Noted   Asthmatic bronchitis with acute exacerbation 04/07/2022   Rheumatoid arthritis ([place]) 02/23/2022   High risk medication use 02/23/2022   COPD (chronic obstructive pulmonary disease) ([place]) 02/10/2022   Acute kidney injury superimposed on chronic kidney disease ([place]) 02/08/2022   Class 2 obesity 02/08/2022  Acute on chronic combined systolic (congestive) and diastolic (congestive) heart failure ([place]) 02/04/2022   DM2 (diabetes mellitus, type 2) ([place]) 02/04/2022   Acute on chronic combined systolic and diastolic CHF (congestive heart failure) ([place]) 02/04/2022   Nocturnal hypoxemia 12/30/2021   Epistaxis 12/12/2021   Non-ischemic cardiomyopathy ([place]) 07/29/2020   Hemorrhagic shock ([place]) 02/18/2020   Acute blood loss anemia 02/18/2020   Postural dizziness with presyncope 02/18/2020   Ulcerative colitis, acute,  unspecified complication ([place]) [id number]   Diarrhea 02/12/2020   Generalized abdominal pain 02/12/2020   Thrombocytosis 02/12/2020   Sprain of left wrist 08/12/2019   CHF (congestive heart failure) ([place]) 07/17/2017   COPD with acute exacerbation ([place]) 06/14/2017   Hyponatremia 05/27/2016   Hypokalemia 05/27/2016   Depression 04/05/2016   Kidney stones 04/05/2016   Osteoarthritis 04/05/2016   OSA (obstructive sleep apnea) 04/05/2016   Bilateral carpal tunnel syndrome 12/02/2015   Chronic combined systolic and diastolic congestive heart failure ([place]) 04/13/2015   Orthostatic hypotension 01/19/2015   Obesity 11/26/2014   Enlarged prostate without lower urinary tract symptoms (luts) 10/29/2014   Essential hypertension 10/29/2014   GERD (gastroesophageal reflux disease) 10/29/2014   Panic attack 10/29/2014   Changing skin lesion 10/16/2014   Renal cyst 09/02/2014   Pacemaker lead malfunction-elevated threshold RV lead 07/31/2014   Dyspnea on exertion 06/21/2014   Gastroenteritis, acute 06/15/2014   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   Palpitations 06/11/2014   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   Other cardiomyopathies ([place]) 04/29/2014   Bradycardia 04/28/2014   Chronic pain 02/12/2014   Thrombocythemia 12/01/2013   Muscular wasting and disuse atrophy 08/15/2013   Lumbar and sacral osteoarthritis 05/19/2013   Congestive dilated cardiomyopathy ([place]) 01/11/2012   Compulsive tobacco user syndrome 12/13/2011   Current tobacco use 12/13/2011   Chest tightness    Rash    Back pain    Hemochromatosis    SOB (shortness of breath)    Gout    Polycythemia vera ([place]) 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 Jude Allure Quadra pulse generator [id number], model PM 3242   Back pain    Cardiomyopathy (HCC)    Nonischemic 45%.    CHF (congestive heart failure) ([place])    Diabetes mellitus without complication ([place])    Gout     Hemochromatosis    Hypertension    Hypospadias February 11, 1951   born with   Nephrolithiasis    Polycythemia vera(238.4)     Family History  Problem Relation Age of Onset   Stroke Mother    Other Father        Deceased, car fell on him   Diabetes Sister    Hypertension Sister    Heart disease Maternal Grandmother        Pacemaker, [place]   Past Surgical History:  Procedure Laterality Date   ANKLE SURGERY     BI-VENTRICULAR PACEMAKER INSERTION N/A 04/29/2014   Procedure: BI-VENTRICULAR PACEMAKER INSERTION (CRT-P);  Surgeon: Deboraha Sprang, MD; Laterality: Left  St. Jude Allure Quadra pulse generator [id number], model [place] 3242   CARDIAC SURGERY     COLONOSCOPY N/A 02/15/2020   Procedure: COLONOSCOPY;  Surgeon: Ronnette Juniper, MD;  Location: [facility];  Service: Gastroenterology;  Laterality: N/A;   ESOPHAGOGASTRODUODENOSCOPY (EGD) WITH PROPOFOL N/A 02/15/2020   Procedure: ESOPHAGOGASTRODUODENOSCOPY (EGD) WITH PROPOFOL;  Surgeon: Ronnette Juniper, MD;  Location: [facility];  Service: Gastroenterology;  Laterality: N/A;   LEAD INSERTION N/A 04/28/2022  Procedure: LEAD INSERTION;  Surgeon: Deboraha Sprang, MD;  Location: [facility];  Service: Cardiovascular;  Laterality: N/A;   PILONIDAL CYST EXCISION     POSTERIOR LAMINECTOMY / DECOMPRESSION LUMBAR SPINE     PPM GENERATOR CHANGEOUT N/A 04/28/2022   Procedure: PPM GENERATOR CHANGEOUT;  Surgeon: Deboraha Sprang, MD;  Location: [facility];  Service: Cardiovascular;  Laterality: N/A;   TONSILLECTOMY     Social History   Social History Narrative   Lives at home with wife in a one story home.  Has no children.  Does not work.  Getting workman's comp.  Education: 4 years trade school.    Immunization History  Administered Date(s) Administered   COVID-19, mRNA, vaccine(Comirnaty)12 years and older 03/02/2022   DTaP 01/26/2016   Fluad Quad(high Dose 65+) 03/07/2021, 03/02/2022   Influenza Split 01/21/2015   Influenza, High Dose  Seasonal PF 02/23/2017, 03/22/2018, 01/10/2019, 03/01/2020   Influenza,inj,Quad PF,6+ Mos 01/21/2015, 01/27/2016, 02/05/2017   Influenza-Unspecified 01/26/2016   PFIZER Comirnaty(Gray Top)Covid-19 Tri-Sucrose Vaccine 09/10/2020   PFIZER(Purple Top)SARS-COV-2 Vaccination 06/21/2019, 07/13/2019, 04/16/2020, 09/10/2020   Pfizer Covid-19 Vaccine Bivalent Booster 35yr & up 03/07/2021   Pneumococcal Conjugate-13 01/27/2016, 01/09/2018   Pneumococcal Polysaccharide-23 01/21/2015   Pneumococcal-Unspecified 01/26/2016   Tdap 01/26/2011, 01/27/2016, 08/09/2019   Zoster, Live 08/29/2019     Objective: Vital Signs: There were no vitals taken for this visit.   Physical Exam   Musculoskeletal Exam: ***  CDAI Exam: CDAI Score: -- Patient Global: --; Provider Global: -- Swollen: --; Tender: -- Joint Exam 07/07/2022   No joint exam has been documented for this visit   There is currently no information documented on the homunculus. Go to the Rheumatology activity and complete the homunculus joint exam.  Investigation: No additional findings.  Imaging: No results found.  Recent Labs: Lab Results  Component Value Date   WBC 5.9 06/03/2022   HGB 11.6 (L) 06/03/2022   PLT 506 (H) 06/03/2022   NA 131 (L) 06/03/2022   K 3.8 06/03/2022   CL 102 06/03/2022   CO2 21 (L) 06/03/2022   GLUCOSE 145 (H) 06/03/2022   BUN 24 (H) 06/03/2022   CREATININE 1.17 06/03/2022   BILITOT 0.6 05/25/2022   ALKPHOS 62 05/25/2022   AST 13 (L) 05/25/2022   ALT 10 05/25/2022   PROT 7.1 05/25/2022   ALBUMIN 3.9 05/25/2022   CALCIUM 8.0 (L) 06/03/2022   GFRAA 83 05/10/2020    Speciality Comments: No specialty comments available.  Procedures:  No procedures performed Allergies: Advil [ibuprofen], Keflex [cephalexin], Wellbutrin [bupropion], Prednisone, Temazepam, Trazodone and nefazodone, Sacubitril-valsartan, and Prozac [fluoxetine hcl]   Assessment / Plan:     Visit Diagnoses: No diagnosis  found.  ***  Orders: No orders of the defined types were placed in this encounter.  No orders of the defined types were placed in this encounter.    Follow-Up Instructions: No follow-ups on file.   CCollier Salina MD  Note - This record has been created using [employer]  Chart creation errors have been sought, but may not always  have been located. Such creation errors do not reflect on  the standard of medical care.

## 2022-07-10 ENCOUNTER — Encounter: Payer: Self-pay | Admitting: *Deleted

## 2022-07-13 DIAGNOSIS — Z87891 Personal history of nicotine dependence: Secondary | ICD-10-CM | POA: Diagnosis not present

## 2022-07-13 DIAGNOSIS — I441 Atrioventricular block, second degree: Secondary | ICD-10-CM | POA: Diagnosis not present

## 2022-07-13 DIAGNOSIS — N183 Chronic kidney disease, stage 3 unspecified: Secondary | ICD-10-CM | POA: Diagnosis not present

## 2022-07-13 DIAGNOSIS — I130 Hypertensive heart and chronic kidney disease with heart failure and stage 1 through stage 4 chronic kidney disease, or unspecified chronic kidney disease: Secondary | ICD-10-CM | POA: Diagnosis not present

## 2022-07-13 DIAGNOSIS — Z9181 History of falling: Secondary | ICD-10-CM | POA: Diagnosis not present

## 2022-07-13 DIAGNOSIS — E1122 Type 2 diabetes mellitus with diabetic chronic kidney disease: Secondary | ICD-10-CM | POA: Diagnosis not present

## 2022-07-13 DIAGNOSIS — S32049D Unspecified fracture of fourth lumbar vertebra, subsequent encounter for fracture with routine healing: Secondary | ICD-10-CM | POA: Diagnosis not present

## 2022-07-13 DIAGNOSIS — K5900 Constipation, unspecified: Secondary | ICD-10-CM | POA: Diagnosis not present

## 2022-07-13 DIAGNOSIS — Z794 Long term (current) use of insulin: Secondary | ICD-10-CM | POA: Diagnosis not present

## 2022-07-13 DIAGNOSIS — W19XXXD Unspecified fall, subsequent encounter: Secondary | ICD-10-CM | POA: Diagnosis not present

## 2022-07-13 DIAGNOSIS — G4733 Obstructive sleep apnea (adult) (pediatric): Secondary | ICD-10-CM | POA: Diagnosis not present

## 2022-07-13 DIAGNOSIS — K649 Unspecified hemorrhoids: Secondary | ICD-10-CM | POA: Diagnosis not present

## 2022-07-13 DIAGNOSIS — K219 Gastro-esophageal reflux disease without esophagitis: Secondary | ICD-10-CM | POA: Diagnosis not present

## 2022-07-13 DIAGNOSIS — J189 Pneumonia, unspecified organism: Secondary | ICD-10-CM | POA: Diagnosis not present

## 2022-07-13 DIAGNOSIS — M1A30X0 Chronic gout due to renal impairment, unspecified site, without tophus (tophi): Secondary | ICD-10-CM | POA: Diagnosis not present

## 2022-07-14 ENCOUNTER — Telehealth: Payer: Self-pay | Admitting: Internal Medicine

## 2022-07-14 ENCOUNTER — Telehealth: Payer: Self-pay

## 2022-07-14 NOTE — Telephone Encounter (Signed)
LMOVM for patient to send missed transmission.

## 2022-07-14 NOTE — Telephone Encounter (Signed)
The patient called to get help with his monitor. I called [employer] tech support to get additional help.

## 2022-07-14 NOTE — Telephone Encounter (Signed)
Patient called stating he was trying to send transmission, and his transmission box just keeps beeping.

## 2022-07-17 ENCOUNTER — Ambulatory Visit: Payer: PPO | Attending: Internal Medicine

## 2022-07-17 DIAGNOSIS — Z95810 Presence of automatic (implantable) cardiac defibrillator: Secondary | ICD-10-CM

## 2022-07-17 DIAGNOSIS — I5022 Chronic systolic (congestive) heart failure: Secondary | ICD-10-CM

## 2022-07-17 NOTE — Progress Notes (Signed)
EPIC Encounter for ICM Monitoring  Patient Name: William Villanueva is a 72 y.o. male Date: 07/17/2022 Primary Care Physican: Antony Contras, MD Primary Cardiologist: Stanford Breed Electrophysiologist: Vergie Living Pacing: 94%            07/17/2022 Weight:  217 lbs    AT/AF Burden <1%                                                            Spoke with patient and heart failure questions reviewed.  Transmission results reviewed.  Pt asymptomatic for fluid accumulation.  Pt is dieting and losing weight and is 2 lbs away from losing 100 lbs.                 CorVue thoracic daily and reference impedance changing course and both trending up suggesting possible dryness.   Prescribed:   Furosemide 40 mg Take 2 tablet(2) (80 mg total) by mouth daily.  Spironolactone 25 mg take 0.5 tablet (12.5 mg total) once a day   Labs: 06/27/2022 Creatinine 0.94, BUN 21, Potassium 4.6, Sodium 140, GFR 87 06/03/2022 Creatinine 1.17, BUN 24, Potassium 3.8, Sodium 131, GFR >60 05/25/2022 Creatinine 1.11, BUN 22, Potassium 4.1, Sodium 138 04/28/2022 Creatinine 1.13, BUN 27, Potassium 4.2, Sodium 140, GFR >60 03/24/2022 Creatinine 1.16, BUN 20, Potassium 3.3, Sodium 142 03/23/2022 Creatinine 1.09, BUN 19, Potassium 4.1, Sodium 143, GFR 73  02/16/2022 Creatinine 1.46, BUN 31, Potassium 4.1, Sodium 136  02/14/2022 Creatinine 1.44, BUN 33, Potassium 3.9, Sodium 137  02/10/2022 Creatinine 1.79, BUN 41, Potassium 4.2, Sodium 135  02/09/2022 Creatinine 1.89, BUN 34, Potassium 4.3, Sodium 136  A complete set of results can be found in Results Review.   Recommendations:  Advised to increase fluid intake to 64 oz daily to stay hydrated   Follow-up plan: ICM clinic phone appointment on 08/21/2022.   91 day device clinic remote transmission 08/01/2022.     EP/Cardiology Office Visits:   08/18/2022 with Dr Stanford Breed.  08/10/2022 with Dr Caryl Comes   Copy of ICM check sent to Dr. Caryl Comes.    3 month ICM trend: 07/17/2022.    12-14  Month ICM trend:     Rosalene Billings, RN 07/17/2022 12:14 PM

## 2022-07-21 ENCOUNTER — Other Ambulatory Visit: Payer: Self-pay | Admitting: Hematology & Oncology

## 2022-07-21 ENCOUNTER — Other Ambulatory Visit (HOSPITAL_COMMUNITY): Payer: Self-pay

## 2022-07-21 DIAGNOSIS — D45 Polycythemia vera: Secondary | ICD-10-CM

## 2022-07-25 DIAGNOSIS — N401 Enlarged prostate with lower urinary tract symptoms: Secondary | ICD-10-CM | POA: Diagnosis not present

## 2022-07-25 DIAGNOSIS — Z125 Encounter for screening for malignant neoplasm of prostate: Secondary | ICD-10-CM | POA: Diagnosis not present

## 2022-07-25 DIAGNOSIS — R350 Frequency of micturition: Secondary | ICD-10-CM | POA: Diagnosis not present

## 2022-07-26 ENCOUNTER — Other Ambulatory Visit: Payer: Self-pay | Admitting: Internal Medicine

## 2022-07-26 DIAGNOSIS — M1A09X0 Idiopathic chronic gout, multiple sites, without tophus (tophi): Secondary | ICD-10-CM

## 2022-07-26 NOTE — Telephone Encounter (Signed)
Please schedule patient a follow up visit. Patient due 07/2022. Thanks!

## 2022-07-26 NOTE — Telephone Encounter (Signed)
Last Fill: 04/10/2022  Labs: 06/15/2022  Renal function panel- Chloride 108, Calcium 8.3, Albumin 3.6 CBC- RBC 3.18, Hemoglobin 10.9, Hematocrit 32.4, MCV 102, MCH 34.4, Platelets 537, Lymphs 0.6 Uric Acid 5.4  Next Visit: Due 07/07/2022. Message sent to the front to schedule.   Last Visit: 04/07/2022  DX: Idiopathic chronic gout of multiple sites without tophus   Current Dose per office note 04/07/2022: Increased allopurinol dose to 200 mg we will recheck today now a month on this. If still above goal continue titration to 300 mg   Okay to refill Allopurinol?

## 2022-07-31 NOTE — Telephone Encounter (Signed)
LMOM for patient to call and schedule follow-up appointment.   °

## 2022-08-01 ENCOUNTER — Ambulatory Visit (INDEPENDENT_AMBULATORY_CARE_PROVIDER_SITE_OTHER): Payer: PPO

## 2022-08-01 DIAGNOSIS — I428 Other cardiomyopathies: Secondary | ICD-10-CM | POA: Diagnosis not present

## 2022-08-01 LAB — CUP PACEART REMOTE DEVICE CHECK
Battery Remaining Longevity: 64 mo
Battery Remaining Percentage: 91 %
Battery Voltage: 3.16 V
Brady Statistic AP VP Percent: 2.3 %
Brady Statistic AP VS Percent: 1 %
Brady Statistic AS VP Percent: 92 %
Brady Statistic AS VS Percent: 3.7 %
Brady Statistic RA Percent Paced: 1 %
Date Time Interrogation Session: 20240326020016
HighPow Impedance: 75 Ohm
HighPow Impedance: 75 Ohm
Implantable Lead Connection Status: 753985
Implantable Lead Connection Status: 753985
Implantable Lead Connection Status: 753985
Implantable Lead Implant Date: 20151223
Implantable Lead Implant Date: 20151223
Implantable Lead Implant Date: 20231222
Implantable Lead Location: 753858
Implantable Lead Location: 753859
Implantable Lead Location: 753860
Implantable Lead Model: 7122
Implantable Pulse Generator Implant Date: 20231222
Lead Channel Impedance Value: 390 Ohm
Lead Channel Impedance Value: 550 Ohm
Lead Channel Impedance Value: 740 Ohm
Lead Channel Pacing Threshold Amplitude: 0.5 V
Lead Channel Pacing Threshold Amplitude: 1 V
Lead Channel Pacing Threshold Amplitude: 1.5 V
Lead Channel Pacing Threshold Pulse Width: 0.5 ms
Lead Channel Pacing Threshold Pulse Width: 0.5 ms
Lead Channel Pacing Threshold Pulse Width: 0.5 ms
Lead Channel Sensing Intrinsic Amplitude: 12 mV
Lead Channel Sensing Intrinsic Amplitude: 3.5 mV
Lead Channel Setting Pacing Amplitude: 2 V
Lead Channel Setting Pacing Amplitude: 2.5 V
Lead Channel Setting Pacing Amplitude: 3.5 V
Lead Channel Setting Pacing Pulse Width: 0.5 ms
Lead Channel Setting Pacing Pulse Width: 0.5 ms
Lead Channel Setting Sensing Sensitivity: 0.5 mV
Pulse Gen Serial Number: 5555736

## 2022-08-07 DIAGNOSIS — I10 Essential (primary) hypertension: Secondary | ICD-10-CM | POA: Diagnosis not present

## 2022-08-07 DIAGNOSIS — I5042 Chronic combined systolic (congestive) and diastolic (congestive) heart failure: Secondary | ICD-10-CM | POA: Diagnosis not present

## 2022-08-07 DIAGNOSIS — N1831 Chronic kidney disease, stage 3a: Secondary | ICD-10-CM | POA: Diagnosis not present

## 2022-08-07 DIAGNOSIS — E1122 Type 2 diabetes mellitus with diabetic chronic kidney disease: Secondary | ICD-10-CM | POA: Diagnosis not present

## 2022-08-07 DIAGNOSIS — J449 Chronic obstructive pulmonary disease, unspecified: Secondary | ICD-10-CM | POA: Diagnosis not present

## 2022-08-08 NOTE — Progress Notes (Signed)
HPI: FU cardiomyopathy/CHF. Echocardiogram in August of 2013 showed an ejection fraction of 35-40%. Cardiac catheterization in September of 2013 showed mild nonobstructive coronary disease. There was no hemodynamic evidence of restriction. Pulmonary capillary wedge pressure 10. Cardiac MRI in September of 2013 showed an ejection fraction of 34% with diffuse hypokinesis. There was no hyperenhancement or scar tissue and no evidence of cardiac hemochromatosis. TSH in September 2013 normal. 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 Patient subsequently had CRTP placed. Nuclear study March 2017 showed ejection fraction 32. There was prior inferior infarct but no ischemia. Treated medically as felt likely attenuation artifact. Chest CT August 2023 showed small bilateral pleural effusions, pulmonary artery enlargement, emphysema, coronary atherosclerosis.  Last echocardiogram September 2023 showed ejection fraction 40%, grade 2 diastolic dysfunction, mild right ventricular enlargement, severe left atrial enlargement, mild right atrial enlargement, mild mitral regurgitation.  Patient's device was upgraded to CRT-D in December after symptomatic high degree AV block noted.  Since last seen, patient has had problems falling and has had some difficulties with orthostatic symptoms.  His losartan was discontinued by Dr. Graciela Husbands.  He has not had exertional chest pain.  Current Outpatient Medications  Medication Sig Dispense Refill   acetaminophen (TYLENOL) 500 MG tablet Take 1,000 mg by mouth every 6 (six) hours as needed for headache.     allopurinol (ZYLOPRIM) 300 MG tablet TAKE 1 TABLET BY MOUTH DAILY 30 tablet 2   budesonide (PULMICORT) 0.5 MG/2ML nebulizer solution USE 1 VIAL VIA NEBULIZER TWICE  DAILY (RINSE MOUTH AFTER USE) 360 mL 3   carvedilol (COREG) 3.125 MG tablet Take 1 tablet (3.125 mg total) by mouth 2 (two) times daily with a meal. 200 tablet 2   cholestyramine (QUESTRAN) 4 GM/DOSE  powder Take 1 g by mouth 2 (two) times daily as needed (diarrhea).     citalopram (CELEXA) 10 MG tablet Take 5 mg by mouth in the morning.     Cyanocobalamin (B-12 PO) Take 1 tablet by mouth daily.     diclofenac Sodium (VOLTAREN) 1 % GEL Apply 2 g topically 4 (four) times daily. Apply to left shoulder for pain. (Patient taking differently: Apply 2 g topically daily as needed (Apply to left shoulder for pain.).) 200 g 0   dicyclomine (BENTYL) 20 MG tablet Take 20 mg by mouth 2 (two) times daily.     folic acid (FOLVITE) 1 MG tablet Take 1 mg by mouth in the morning.     furosemide (LASIX) 40 MG tablet Take 2 tablets (80 mg total) by mouth daily. (Patient taking differently: Take 40-80 mg by mouth in the morning.) 30 tablet 0   hydroxyurea (HYDREA) 500 MG capsule TAKE TWO CAPSULES BY MOUTH DAILY 160 capsule 4   insulin degludec (TRESIBA FLEXTOUCH) 100 UNIT/ML SOPN FlexTouch Pen Inject 20 Units into the skin daily after breakfast.      ipratropium-albuterol (DUONEB) 0.5-2.5 (3) MG/3ML SOLN Inhale one vial in nebulizer twice daily and in between if needed     ketoconazole (NIZORAL) 2 % cream Apply 1 Application topically daily.     Magnesium Oxide 400 MG CAPS Take 2 capsules (800 mg total) by mouth in the morning and at bedtime. (Patient taking differently: Take 400 mg by mouth in the morning and at bedtime.) 90 capsule 1   omeprazole (PRILOSEC) 20 MG capsule Take 20 mg by mouth in the morning and at bedtime.     oxybutynin (DITROPAN XL) 15 MG 24 hr tablet  Take 15 mg by mouth in the morning.     prednisoLONE acetate (PRED FORTE) 1 % ophthalmic suspension Place 1 drop into both eyes daily as needed (eye irritation.).     rosuvastatin (CRESTOR) 5 MG tablet Take 5 mg by mouth in the morning.     spironolactone (ALDACTONE) 25 MG tablet Take 0.5 tablets (12.5 mg total) by mouth daily. 45 tablet 3   tamsulosin (FLOMAX) 0.4 MG CAPS capsule Take 0.4 mg by mouth 2 (two) times daily.      Turmeric (QC TUMERIC  COMPLEX) 500 MG CAPS Take 1 tablet by mouth daily.     zolpidem (AMBIEN) 5 MG tablet Take 1 tablet (5 mg total) by mouth at bedtime as needed for sleep. 15 tablet 0   No current facility-administered medications for this visit.     Past Medical History:  Diagnosis Date   AV block, 2nd degree 2015   St. Jude Allure Quadra pulse generator [id number], model PM 3242   Back pain    Cardiomyopathy    Nonischemic 45%.    CHF (congestive heart failure)    Diabetes mellitus without complication    Gout    Hemochromatosis    Hypertension    Hypospadias November 06, 1950   born with   Nephrolithiasis    Polycythemia vera(238.4)     Past Surgical History:  Procedure Laterality Date   ANKLE SURGERY     BI-VENTRICULAR PACEMAKER INSERTION N/A 04/29/2014   Procedure: BI-VENTRICULAR PACEMAKER INSERTION (CRT-P);  Surgeon: Duke Salvia, MD; Laterality: Left  St. Jude Allure Quadra pulse generator [id number], model [place] 6384   CARDIAC SURGERY     COLONOSCOPY N/A 02/15/2020   Procedure: COLONOSCOPY;  Surgeon: Kerin Salen, MD;  Location: [facility] ENDOSCOPY;  Service: Gastroenterology;  Laterality: N/A;   ESOPHAGOGASTRODUODENOSCOPY (EGD) WITH PROPOFOL N/A 02/15/2020   Procedure: ESOPHAGOGASTRODUODENOSCOPY (EGD) WITH PROPOFOL;  Surgeon: Kerin Salen, MD;  Location: [facility] ENDOSCOPY;  Service: Gastroenterology;  Laterality: N/A;   LEAD INSERTION N/A 04/28/2022   Procedure: LEAD INSERTION;  Surgeon: Duke Salvia, MD;  Location: [facility] IN[facility]ASIVE [facility];  Service: Cardiovascular;  Laterality: N/A;   PILONIDAL CYST EXCISION     POSTERIOR LAMINECTOMY / DECOMPRESSION LUMBAR SPINE     PPM GENERATOR CHANGEOUT N/A 04/28/2022   Procedure: PPM GENERATOR CHANGEOUT;  Surgeon: Duke Salvia, MD;  Location: [facility] INVAS[facility];  Service: Cardiovascular;  Laterality: N/A;   TONSILLECTOMY      Social History   Socioeconomic History   Marital status: Married    Spouse name: Elease Hashimoto   Number of children: 0   Years of education: Not  on file   Highest education level: High school graduate  Occupational History   Occupation: Retired  Tobacco Use   Smoking status: Former    Packs/day: 1.00    Years: 44.00    Additional pack years: 0.00    Total pack years: 44.00    Types: Cigarettes    Start date: 06/05/1968    Quit date: 04/05/2013    Years since quitting: 9.3    Passive exposure: Never   Smokeless tobacco: Never  Vaping Use   Vaping Use: Never used  Substance and Sexual Activity   Alcohol use: Yes    Alcohol/week: 0.0 standard drinks of alcohol    Comment: rare   Drug use: No   Sexual activity: Not Currently  Other Topics Concern   Not on file  Social History Narrative   Lives at home with wife in a  one story home.  Has no children.  Does not work.  Getting workman's comp.  Education: 4 years trade school.    Social Determinants of Health   Financial Resource Strain: Low Risk  (02/06/2022)   Overall Financial Resource Strain (CARDIA)    Difficulty of Paying Living Expenses: Not very hard  Food Insecurity: No Food Insecurity (02/06/2022)   Hunger Vital Sign    Worried About Running Out of Food in the Last Year: Never true    Ran Out of Food in the Last Year: Never true  Transportation Needs: No Transportation Needs (02/06/2022)   PRAPARE - Administrator, Civil Service (Medical): No    Lack of Transportation (Non-Medical): No  Physical Activity: Not on file  Stress: Not on file  Social Connections: Not on file  Intimate Partner Violence: Not on file    Family History  Problem Relation Age of Onset   Stroke Mother    Other Father        Deceased, car fell on him   Diabetes Sister    Hypertension Sister    Heart disease Maternal Grandmother        Pacemaker, MI    ROS: no fevers or chills, productive cough, hemoptysis, dysphasia, odynophagia, melena, hematochezia, dysuria, hematuria, rash, seizure activity, orthopnea, PND, pedal edema, claudication. Remaining systems are  negative.  Physical Exam: Well-developed well-nourished in no acute distress.  Skin is warm and dry.  HEENT is normal.  Neck is supple.  Chest is clear to auscultation with normal expansion.  Cardiovascular exam is regular rate and rhythm.  Abdominal exam nontender or distended. No masses palpated. Extremities show no edema. neuro grossly intact  ECG-normal sinus rhythm with ventricular pacing.  Personally reviewed  A/P  1 chronic combined systolic/diastolic congestive heart failure-patient appears to be euvolemic on examination.  Plan to continue diuretic at present dose.  We have not added SGLT2 inhibitor due to his large pannus/risk of infection and use of Hydrea.  2 nonischemic cardiomyopathy-continue carvedilol.  He did not tolerate Entresto previously and his losartan was recently discontinued due to orthostatic symptoms/falling.  We will consider resuming in the future if necessary.  3 hypertension-patient's blood pressure is controlled.  Continue present medications.  4 CRT-P-managed by Dr. Graciela Husbands.  5 coronary calcification-continue statin.  He has not had chest pain.  6 hyperlipidemia-continue statin.  7 morbid obesity-we discussed the importance of weight loss.  Olga Millers, MD

## 2022-08-10 ENCOUNTER — Encounter: Payer: Self-pay | Admitting: Internal Medicine

## 2022-08-10 ENCOUNTER — Ambulatory Visit: Payer: PPO | Attending: Internal Medicine | Admitting: Internal Medicine

## 2022-08-10 VITALS — BP 95/61 | HR 95 | Ht 70.0 in | Wt 229.8 lb

## 2022-08-10 DIAGNOSIS — Z950 Presence of cardiac pacemaker: Secondary | ICD-10-CM

## 2022-08-10 DIAGNOSIS — I951 Orthostatic hypotension: Secondary | ICD-10-CM | POA: Diagnosis not present

## 2022-08-10 DIAGNOSIS — I5042 Chronic combined systolic (congestive) and diastolic (congestive) heart failure: Secondary | ICD-10-CM

## 2022-08-10 DIAGNOSIS — I428 Other cardiomyopathies: Secondary | ICD-10-CM

## 2022-08-10 NOTE — Patient Instructions (Addendum)
Medication Instructions:  Your physician recommends that you continue on your current medications as directed. Please refer to the Current Medication list given to you today.  ** Stop Losartan  *If you need a refill on your cardiac medications before your next appointment, please call your pharmacy*   Lab Work: None ordered.  If you have labs (blood work) drawn today and your tests are completely normal, you will receive your results only by: [place] (if you have MyChart) OR A paper copy in the mail If you have any lab test that is abnormal or we need to change your treatment, we will call you to review the results.   Testing/Procedures: None ordered.    Follow-Up: At [facility], you and your health needs are our priority.  As part of our continuing mission to provide you with exceptional heart care, we have created designated Provider Care Teams.  These Care Teams include your primary Cardiologist (physician) and Advanced Practice Providers (APPs -  Physician Assistants and Nurse Practitioners) who all work together to provide you with the care you need, when you need it.  We recommend signing up for the patient portal called "MyChart".  Sign up information is provided on this After Visit Summary.  MyChart is used to connect with patients for Virtual Visits (Telemedicine).  Patients are able to view lab/test results, encounter notes, upcoming appointments, etc.  Non-urgent messages can be sent to your provider as well.   To learn more about what you can do with MyChart, go to [email or web address].    Your next appointment:   3 months with Dr Olin Pia PA - Tuesday 10/17/2022 at [place], PA-C

## 2022-08-10 NOTE — Progress Notes (Signed)
Electrophysiology Office Note   Date:  08/10/2022   ID:  William Villanueva, DOB 1950/09/21, MRN [id number]  PCP:  Antony Contras, MD  Cardiologist:  [facility] Primary Electrophysiologist:    [name], MD    No chief complaint on file.    History of Present Illness: William Villanueva is a 72 y.o. male is   seen in follow-up for CRT-P implantation for nonischemic cardiomyopathy. He had presented to 12/15 with symptomatic high-grade heart block.  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; carries a diagnosis of cardiac hemochromatosis, although in cMRI there is no evidence of same.  Without chest pain or shortness of breath.  Not very ambulatory though.  No edema.  Has some discomfort over his device site which is new and he thinks worse over the last month  Orthostatic lightheadedness with some presyncope    DATE TEST EF   9/13 cMRI  34% LGE neg  2/16 Myoview  35%   3/17 Myoview    32 %   1/20 Echo  40-45%   9/21 Echo  30-35%   2/22 Echo  45-50%  Following ECG resynchronization  9/23 Echo  40% LAE severe 45/mm 2      Date Cr K Hgb BNP  9/19 1.44 4.1 13.7   9/21  1.56 3.8 15.2 2864  4/23 1.3 4.5 13.7   1/24 1.17 3.8 11.6      Past Medical History:  Diagnosis Date   AV block, 2nd degree 2015   St. Jude Allure Quadra pulse generator [id number], model PM 3242   Back pain    Cardiomyopathy    Nonischemic 45%.    CHF (congestive heart failure)    Diabetes mellitus without complication    Gout    Hemochromatosis    Hypertension    Hypospadias 1950-10-04   born with   Nephrolithiasis    Polycythemia vera(238.4)    Past Surgical History:  Procedure Laterality Date   ANKLE SURGERY     BI-VENTRICULAR PACEMAKER INSERTION N/A 04/29/2014   Procedure: BI-VENTRICULAR PACEMAKER INSERTION (CRT-P);  Surgeon: Deboraha Sprang, MD; Laterality: Left  St. Jude Allure Quadra pulse generator [id number], model [place] 3242   CARDIAC SURGERY     COLONOSCOPY  N/A 02/15/2020   Procedure: COLONOSCOPY;  Surgeon: Ronnette Juniper, MD;  Location: [place];  Service: Gastroenterology;  Laterality: N/A;   ESOPHAGOGASTRODUODENOSCOPY (EGD) WITH PROPOFOL N/A 02/15/2020   Procedure: ESOPHAGOGASTRODUODENOSCOPY (EGD) WITH PROPOFOL;  Surgeon: Ronnette Juniper, MD;  Location: [place];  Service: Gastroenterology;  Laterality: N/A;   LEAD INSERTION N/A 04/28/2022   Procedure: LEAD INSERTION;  Surgeon: Deboraha Sprang, MD;  Location: [place] [facility];  Service: Cardiovascular;  Laterality: N/A;   PILONIDAL CYST EXCISION     POSTERIOR LAMINECTOMY / DECOMPRESSION LUMBAR SPINE     PPM GENERATOR CHANGEOUT N/A 04/28/2022   Procedure: PPM GENERATOR CHANGEOUT;  Surgeon: Deboraha Sprang, MD;  Location: [place] [facility];  Service: Cardiovascular;  Laterality: N/A;   TONSILLECTOMY       Current Outpatient Medications  Medication Sig Dispense Refill   acetaminophen (TYLENOL) 500 MG tablet Take 1,000 mg by mouth every 6 (six) hours as needed for headache.     allopurinol (ZYLOPRIM) 300 MG tablet TAKE 1 TABLET BY MOUTH DAILY 30 tablet 2   budesonide (PULMICORT) 0.5 MG/2ML nebulizer solution USE 1 VIAL VIA NEBULIZER TWICE  DAILY (RINSE MOUTH AFTER USE) 360 mL 3   carvedilol (COREG) 3.125  MG tablet Take 1 tablet (3.125 mg total) by mouth 2 (two) times daily with a meal. 200 tablet 2   cholestyramine (QUESTRAN) 4 GM/DOSE powder Take 1 g by mouth 2 (two) times daily as needed (diarrhea).     citalopram (CELEXA) 10 MG tablet Take 5 mg by mouth in the morning.     Cyanocobalamin (B-12 PO) Take 1 tablet by mouth daily.     diclofenac Sodium (VOLTAREN) 1 % GEL Apply 2 g topically 4 (four) times daily. Apply to left shoulder for pain. (Patient taking differently: Apply 2 g topically daily as needed (Apply to left shoulder for pain.).) 200 g 0   dicyclomine (BENTYL) 20 MG tablet Take 20 mg by mouth 2 (two) times daily.     folic acid (FOLVITE) 1 MG tablet Take 1 mg by mouth in the  morning.     furosemide (LASIX) 40 MG tablet Take 2 tablets (80 mg total) by mouth daily. (Patient taking differently: Take 40-80 mg by mouth in the morning.) 30 tablet 0   hydroxyurea (HYDREA) 500 MG capsule TAKE TWO CAPSULES BY MOUTH DAILY 160 capsule 4   insulin degludec (TRESIBA FLEXTOUCH) 100 UNIT/ML SOPN FlexTouch Pen Inject 20 Units into the skin daily after breakfast.      ipratropium-albuterol (DUONEB) 0.5-2.5 (3) MG/3ML SOLN Inhale one vial in nebulizer twice daily and in between if needed     ketoconazole (NIZORAL) 2 % cream Apply 1 Application topically daily.     losartan (COZAAR) 25 MG tablet Take 1 tablet (25 mg total) by mouth daily. (Patient taking differently: Take 12.5 mg by mouth daily.) 30 tablet 0   Magnesium Oxide 400 MG CAPS Take 2 capsules (800 mg total) by mouth in the morning and at bedtime. (Patient taking differently: Take 400 mg by mouth in the morning and at bedtime.) 90 capsule 1   omeprazole (PRILOSEC) 20 MG capsule Take 20 mg by mouth in the morning and at bedtime.     oxybutynin (DITROPAN XL) 15 MG 24 hr tablet Take 15 mg by mouth in the morning.     prednisoLONE acetate (PRED FORTE) 1 % ophthalmic suspension Place 1 drop into both eyes daily as needed (eye irritation.).     rosuvastatin (CRESTOR) 5 MG tablet Take 5 mg by mouth in the morning.     spironolactone (ALDACTONE) 25 MG tablet Take 0.5 tablets (12.5 mg total) by mouth daily. 45 tablet 3   tamsulosin (FLOMAX) 0.4 MG CAPS capsule Take 0.4 mg by mouth 2 (two) times daily.      Turmeric (QC TUMERIC COMPLEX) 500 MG CAPS Take 1 tablet by mouth daily.     zolpidem (AMBIEN) 5 MG tablet Take 1 tablet (5 mg total) by mouth at bedtime as needed for sleep. 15 tablet 0   No current facility-administered medications for this visit.    Allergies:   Advil [ibuprofen], Keflex [cephalexin], Wellbutrin [bupropion], Prednisone, Temazepam, Trazodone and nefazodone, Sacubitril-valsartan, and Prozac [fluoxetine hcl]      PHYSICAL EXAM: VS:  BP 95/61 (Patient Position: Standing)   Pulse 95   Ht 5\' 10"  (1.778 m)   Wt 229 lb 12.8 oz (104.2 kg)   SpO2 93%   BMI 32.97 kg/m  , BMI Body mass index is 32.97 kg/m. Well developed and well nourished in no acute distress HENT normal Neck supple with JVP-flat Clear Device pocket well healed; without hematoma or erythema.  There is no tethering--  tenderness over one of the suture sleeves  Regular rate and rhythm, no murmur Abd-soft with active BS No Clubbing cyanosis  edema Skin-warm and dry A & Oriented  Grossly normal sensory and motor function  ECG sinus with P synchronous pacing  20/13/44 Upright QRS lead 1rS lead V1 ECG 11/23 had an upright QRS lead V1 and negative QRS lead I however the QRSd was 175 ms   m with P synchronous pacing with an upright QRS lead V1 and a negative QRS lead I    Device interrogation is reviewed today in detail.  See PaceArt for details.     ASSESSMENT AND PLAN:  NICM  Pocket discomfort  CHF chronic sys/HFpFF  Hypertension  Morbidly obese   ICD-CRT-St. Jude (high-voltage upgrade 12/23)   Orthostatic hypotension   Continues with symptoms of orthostatic lightheadedness.  Now withstanding his cardiomyopathy, we will discontinue his losartan and continue him on spironolactone and carvedilol.  He may need further medication reduction. The next thing would be to try compression  He is euvolemic.  Continue his diuretics; I am not sanguine that he would tolerate more guideline directed therapy including SGLT2 because of volume needs to support orthostatic blood pressure  The tenderness in his pocket is concerning.  There is no cutaneous erythema or calor, but there is tenderness over the suture sleeves.  Will plan to have him come back in a couple of months to review it.  He is advised to let us know if he has any worsening of tenderness or swelling     Signed, [name], MD  08/10/2022 2:47 PM     [address] [place] [address] [phone] (office) [phone] (fax)

## 2022-08-11 ENCOUNTER — Inpatient Hospital Stay: Payer: PPO | Attending: Hematology & Oncology

## 2022-08-11 ENCOUNTER — Inpatient Hospital Stay (HOSPITAL_BASED_OUTPATIENT_CLINIC_OR_DEPARTMENT_OTHER): Payer: PPO | Admitting: Hematology & Oncology

## 2022-08-11 ENCOUNTER — Encounter: Payer: Self-pay | Admitting: Hematology & Oncology

## 2022-08-11 ENCOUNTER — Other Ambulatory Visit: Payer: Self-pay

## 2022-08-11 VITALS — BP 118/65 | HR 81 | Temp 97.7°F | Resp 18 | Ht 70.0 in | Wt 230.0 lb

## 2022-08-11 DIAGNOSIS — I441 Atrioventricular block, second degree: Secondary | ICD-10-CM | POA: Insufficient documentation

## 2022-08-11 DIAGNOSIS — D45 Polycythemia vera: Secondary | ICD-10-CM

## 2022-08-11 DIAGNOSIS — Z7982 Long term (current) use of aspirin: Secondary | ICD-10-CM | POA: Diagnosis not present

## 2022-08-11 LAB — CUP PACEART INCLINIC DEVICE CHECK
Battery Remaining Longevity: 76 mo
Brady Statistic RA Percent Paced: 0.99 %
Brady Statistic RV Percent Paced: 94 %
Date Time Interrogation Session: 20240404143500
HighPow Impedance: 72 Ohm
Implantable Lead Connection Status: 753985
Implantable Lead Connection Status: 753985
Implantable Lead Connection Status: 753985
Implantable Lead Implant Date: 20151223
Implantable Lead Implant Date: 20151223
Implantable Lead Implant Date: 20231222
Implantable Lead Location: 753858
Implantable Lead Location: 753859
Implantable Lead Location: 753860
Implantable Lead Model: 7122
Implantable Pulse Generator Implant Date: 20231222
Lead Channel Impedance Value: 412.5 Ohm
Lead Channel Impedance Value: 475 Ohm
Lead Channel Impedance Value: 675 Ohm
Lead Channel Pacing Threshold Amplitude: 0.5 V
Lead Channel Pacing Threshold Amplitude: 0.5 V
Lead Channel Pacing Threshold Amplitude: 1 V
Lead Channel Pacing Threshold Amplitude: 1 V
Lead Channel Pacing Threshold Amplitude: 1.5 V
Lead Channel Pacing Threshold Amplitude: 1.5 V
Lead Channel Pacing Threshold Pulse Width: 0.5 ms
Lead Channel Pacing Threshold Pulse Width: 0.5 ms
Lead Channel Pacing Threshold Pulse Width: 0.5 ms
Lead Channel Pacing Threshold Pulse Width: 0.5 ms
Lead Channel Pacing Threshold Pulse Width: 0.5 ms
Lead Channel Pacing Threshold Pulse Width: 0.5 ms
Lead Channel Sensing Intrinsic Amplitude: 12 mV
Lead Channel Sensing Intrinsic Amplitude: 2.9 mV
Lead Channel Setting Pacing Amplitude: 2 V
Lead Channel Setting Pacing Amplitude: 2 V
Lead Channel Setting Pacing Amplitude: 2.5 V
Lead Channel Setting Pacing Pulse Width: 0.5 ms
Lead Channel Setting Pacing Pulse Width: 0.5 ms
Lead Channel Setting Sensing Sensitivity: 0.5 mV
Pulse Gen Serial Number: 5555736

## 2022-08-11 LAB — CMP (CANCER CENTER ONLY)
ALT: 8 U/L (ref 0–44)
AST: 13 U/L — ABNORMAL LOW (ref 15–41)
Albumin: 4.1 g/dL (ref 3.5–5.0)
Alkaline Phosphatase: 63 U/L (ref 38–126)
Anion gap: 9 (ref 5–15)
BUN: 26 mg/dL — ABNORMAL HIGH (ref 8–23)
CO2: 29 mmol/L (ref 22–32)
Calcium: 8.7 mg/dL — ABNORMAL LOW (ref 8.9–10.3)
Chloride: 104 mmol/L (ref 98–111)
Creatinine: 1.25 mg/dL — ABNORMAL HIGH (ref 0.61–1.24)
GFR, Estimated: >60 mL/min (ref >60)
Glucose, Bld: 104 mg/dL — ABNORMAL HIGH (ref 70–99)
Potassium: 4.4 mmol/L (ref 3.5–5.1)
Sodium: 142 mmol/L (ref 135–145)
Total Bilirubin: 0.7 mg/dL (ref 0.3–1.2)
Total Protein: 7.1 g/dL (ref 6.5–8.1)

## 2022-08-11 LAB — CBC WITH DIFFERENTIAL (CANCER CENTER ONLY)
Abs Immature Granulocytes: 0.07 10*3/uL (ref 0.00–0.07)
Basophils Absolute: 0.2 10*3/uL — ABNORMAL HIGH (ref 0.0–0.1)
Basophils Relative: 3 %
Eosinophils Absolute: 0.1 10*3/uL (ref 0.0–0.5)
Eosinophils Relative: 1 %
HCT: 38.5 % — ABNORMAL LOW (ref 39.0–52.0)
Hemoglobin: 12.3 g/dL — ABNORMAL LOW (ref 13.0–17.0)
Immature Granulocytes: 1 %
Lymphocytes Relative: 15 %
Lymphs Abs: 1 10*3/uL (ref 0.7–4.0)
MCH: 35.9 pg — ABNORMAL HIGH (ref 26.0–34.0)
MCHC: 31.9 g/dL (ref 30.0–36.0)
MCV: 112.2 fL — ABNORMAL HIGH (ref 80.0–100.0)
Monocytes Absolute: 0.4 10*3/uL (ref 0.1–1.0)
Monocytes Relative: 6 %
Neutro Abs: 4.7 10*3/uL (ref 1.7–7.7)
Neutrophils Relative %: 74 %
Platelet Count: 455 10*3/uL — ABNORMAL HIGH (ref 150–400)
RBC: 3.43 MIL/uL — ABNORMAL LOW (ref 4.22–5.81)
RDW: 15.6 % — ABNORMAL HIGH (ref 11.5–15.5)
WBC Count: 6.4 10*3/uL (ref 4.0–10.5)
nRBC: 0 % (ref 0.0–0.2)

## 2022-08-11 LAB — IRON AND IRON BINDING CAPACITY (CC-WL,HP ONLY)
Iron: 50 ug/dL (ref 45–182)
Saturation Ratios: 12 % — ABNORMAL LOW (ref 17.9–39.5)
TIBC: 416 ug/dL (ref 250–450)
UIBC: 366 ug/dL (ref 117–376)

## 2022-08-11 LAB — FERRITIN: Ferritin: 17 ng/mL — ABNORMAL LOW (ref 24–336)

## 2022-08-11 LAB — LACTATE DEHYDROGENASE: LDH: 216 U/L — ABNORMAL HIGH (ref 98–192)

## 2022-08-11 NOTE — Progress Notes (Signed)
Hematology and Oncology Follow Up Visit  William Villanueva [id number] 12/01/1950 72 y.o. 08/11/2022   Principle Diagnosis:  Polycythemia vera-JAK2 positive Heart block-[place]ob[place] II  Current Therapy:   Hydrea 500 mg p.o.BID -dose changed on 08/12/2021 Aspirin 81 mg by mouth daily Phlebotomy to maintain hematocrit below 45%     Interim History:  William Villanueva is back for followup.  He is still doing quite well.  He has had no complaints since we last saw him.  His back does bother him on and off.  He is lost quite a bit of weight.  He is lost 100 pounds.  William Villanueva am so happy for him.  He wants to find another job.  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  He has had no change in bowel or bladder habits.  He has had no rashes.  There has been no leg swelling.  His pacemaker seems to be working well.  William Villanueva think he recently saw his Cardiologist.  Overall, William Villanueva would say that his performance status is ECOG 1.    Medications:  Current Outpatient Medications:    acetaminophen (TYLENOL) 500 MG tablet, Take 1,000 mg by mouth every 6 (six) hours as needed for headache., Disp: , Rfl:    allopurinol (ZYLOPRIM) 300 MG tablet, TAKE 1 TABLET BY MOUTH DAILY, Disp: 30 tablet, Rfl: 2   budesonide (PULMICORT) 0.5 MG/2ML nebulizer solution, USE 1 VIAL VIA NEBULIZER TWICE  DAILY (RINSE MOUTH AFTER USE), Disp: 360 mL, Rfl: 3   carvedilol (COREG) 3.125 MG tablet, Take 1 tablet (3.125 mg total) by mouth 2 (two) times daily with a meal., Disp: 200 tablet, Rfl: 2   cholestyramine (QUESTRAN) 4 GM/DOSE powder, Take 1 g by mouth 2 (two) times daily as needed (diarrhea)., Disp: , Rfl:    citalopram (CELEXA) 10 MG tablet, Take 5 mg by mouth in the morning., Disp: , Rfl:    Cyanocobalamin (B-12 PO), Take 1 tablet by mouth daily., Disp: , Rfl:    diclofenac Sodium (VOLTAREN) 1 % GEL, Apply 2 g topically 4 (four) times daily. Apply to left shoulder for pain. (Patient taking differently: Apply 2 g topically  daily as needed (Apply to left shoulder for pain.).), Disp: 200 g, Rfl: 0   dicyclomine (BENTYL) 20 MG tablet, Take 20 mg by mouth 2 (two) times daily., Disp: , Rfl:    folic acid (FOLVITE) 1 MG tablet, Take 1 mg by mouth in the morning., Disp: , Rfl:    furosemide (LASIX) 40 MG tablet, Take 2 tablets (80 mg total) by mouth daily. (Patient taking differently: Take 40-80 mg by mouth in the morning.), Disp: 30 tablet, Rfl: 0   hydroxyurea (HYDREA) 500 MG capsule, TAKE TWO CAPSULES BY MOUTH DAILY, Disp: 160 capsule, Rfl: 4   insulin degludec (TRESIBA FLEXTOUCH) 100 UNIT/ML SOPN FlexTouch Pen, Inject 20 Units into the skin daily after breakfast. , Disp: , Rfl:    ipratropium-albuterol (DUONEB) 0.5-2.5 (3) MG/3ML SOLN, Inhale one vial in nebulizer twice daily and in between if needed, Disp: , Rfl:    ketoconazole (NIZORAL) 2 % cream, Apply 1 Application topically daily., Disp: , Rfl:    Magnesium Oxide 400 MG CAPS, Take 2 capsules (800 mg total) by mouth in the morning and at bedtime. (Patient taking differently: Take 400 mg by mouth in the morning and at bedtime.), Disp: 90 capsule, Rfl: 1   omeprazole (PRILOSEC) 20 MG capsule, Take 20 mg by mouth in the morning and  at bedtime., Disp: , Rfl:    oxybutynin (DITROPAN XL) 15 MG 24 hr tablet, Take 15 mg by mouth in the morning., Disp: , Rfl:    prednisoLONE acetate (PRED FORTE) 1 % ophthalmic suspension, Place 1 drop into both eyes daily as needed (eye irritation.)., Disp: , Rfl:    rosuvastatin (CRESTOR) 5 MG tablet, Take 5 mg by mouth in the morning., Disp: , Rfl:    spironolactone (ALDACTONE) 25 MG tablet, Take 0.5 tablets (12.5 mg total) by mouth daily., Disp: 45 tablet, Rfl: 3   tamsulosin (FLOMAX) 0.4 MG CAPS capsule, Take 0.4 mg by mouth 2 (two) times daily. , Disp: , Rfl:    Turmeric (QC TUMERIC COMPLEX) 500 MG CAPS, Take 1 tablet by mouth daily., Disp: , Rfl:    zolpidem (AMBIEN) 5 MG tablet, Take 1 tablet (5 mg total) by mouth at bedtime as needed  for sleep., Disp: 15 tablet, Rfl: 0  Allergies:  Allergies  Allergen Reactions   Advil [Ibuprofen] Itching   Keflex [Cephalexin] Diarrhea and Other (See Comments)    Caused C-diff, also    Wellbutrin [Bupropion] Hives and Other (See Comments)   Prednisone Itching and Other (See Comments)    Abdominal pain, also    Temazepam Other (See Comments)    Dizziness    Trazodone And Nefazodone Other (See Comments)    Dizziness   Sacubitril-Valsartan Other (See Comments)   Prozac [Fluoxetine Hcl] Itching    Past Medical History, Surgical history, Social history, and Family History were reviewed and updated.  Review of Systems: Review of Systems  Constitutional: Negative.   HENT: Negative.    Eyes: Negative.   Respiratory:  Positive for shortness of breath.   Cardiovascular:  Positive for chest pain.  Gastrointestinal: Negative.   Genitourinary: Negative.   Musculoskeletal:  Positive for back pain.  Skin: Negative.   Neurological: Negative.   Endo/Heme/Allergies: Negative.   Psychiatric/Behavioral: Negative.      Physical Exam:  height is 5\' 10"  (1.778 m) and weight is 230 lb (104.3 kg). His oral temperature is 97.7 F (36.5 C). His blood pressure is 118/65 and his pulse is 81. His respiration is 18 and oxygen saturation is 97%.   Physical Exam Vitals reviewed.  HENT:     Head: Normocephalic and atraumatic.  Eyes:     Pupils: Pupils are equal, round, and reactive to light.  Cardiovascular:     Rate and Rhythm: Normal rate and regular rhythm.     Heart sounds: Normal heart sounds.  Pulmonary:     Effort: Pulmonary effort is normal.     Breath sounds: Normal breath sounds.  Abdominal:     General: Bowel sounds are normal.     Palpations: Abdomen is soft.  Musculoskeletal:        General: No tenderness or deformity. Normal range of motion.     Cervical back: Normal range of motion.  Lymphadenopathy:     Cervical: No cervical adenopathy.  Skin:    General: Skin is  warm and dry.     Findings: No erythema or rash.  Neurological:     Mental Status: He is alert and oriented to person, place, and time.  Psychiatric:        Behavior: Behavior normal.        Thought Content: Thought content normal.        Judgment: Judgment normal.     Lab Results  Component Value Date   WBC 6.4 08/11/2022  HGB 12.3 (L) 08/11/2022   HCT 38.5 (L) 08/11/2022   MCV 112.2 (H) 08/11/2022   PLT 455 (H) 08/11/2022     Chemistry      Component Value Date/Time   NA 142 08/11/2022 0811   NA 143 03/23/2022 1633   NA 141 04/10/2017 0923   NA 140 04/05/2016 0739   K 4.4 08/11/2022 0811   K 3.5 04/10/2017 0923   K 4.2 04/05/2016 0739   CL 104 08/11/2022 0811   CL 108 04/10/2017 0923   CO2 29 08/11/2022 0811   CO2 24 04/10/2017 0923   CO2 20 (L) 04/05/2016 0739   BUN 26 (H) 08/11/2022 0811   BUN 19 03/23/2022 1633   BUN 12 04/10/2017 0923   BUN 14.7 04/05/2016 0739   CREATININE 1.25 (H) 08/11/2022 0811   CREATININE 1.1 04/10/2017 0923   CREATININE 1.1 04/05/2016 0739      Component Value Date/Time   CALCIUM 8.7 (L) 08/11/2022 0811   CALCIUM 8.4 04/10/2017 0923   CALCIUM 9.0 04/05/2016 0739   ALKPHOS 63 08/11/2022 0811   ALKPHOS 77 04/10/2017 0923   ALKPHOS 91 04/05/2016 0739   AST 13 (L) 08/11/2022 0811   AST 34 04/05/2016 0739   ALT 8 08/11/2022 0811   ALT 32 04/10/2017 0923   ALT 40 04/05/2016 0739   BILITOT 0.7 08/11/2022 0811   BILITOT 0.48 04/05/2016 0739      Impression and Plan: William Villanueva is 72 year old gentleman with polycythemia vera.  Everything is doing okay from my point of view.  We have not had to phlebotomize him  for good 5 years.    At this point, we will plan to get her back in another 3 months.  William Villanueva think if things look okay, then we will see about getting him back after the Summer.  William Villanueva am happy that the pacemaker is doing well.  William Villanueva am sure that he will find a job.     Josph Macho, MD 4/5/20249:06 AM

## 2022-08-15 DIAGNOSIS — H9211 Otorrhea, right ear: Secondary | ICD-10-CM | POA: Diagnosis not present

## 2022-08-15 DIAGNOSIS — Z9622 Myringotomy tube(s) status: Secondary | ICD-10-CM | POA: Diagnosis not present

## 2022-08-18 ENCOUNTER — Encounter: Payer: Self-pay | Admitting: Cardiology

## 2022-08-18 ENCOUNTER — Ambulatory Visit: Payer: PPO | Attending: Cardiology | Admitting: Cardiology

## 2022-08-18 VITALS — BP 132/66 | HR 95 | Ht 70.0 in | Wt 233.6 lb

## 2022-08-18 DIAGNOSIS — I10 Essential (primary) hypertension: Secondary | ICD-10-CM

## 2022-08-18 DIAGNOSIS — Z950 Presence of cardiac pacemaker: Secondary | ICD-10-CM | POA: Diagnosis not present

## 2022-08-18 DIAGNOSIS — I951 Orthostatic hypotension: Secondary | ICD-10-CM | POA: Diagnosis not present

## 2022-08-18 DIAGNOSIS — I5042 Chronic combined systolic (congestive) and diastolic (congestive) heart failure: Secondary | ICD-10-CM | POA: Diagnosis not present

## 2022-08-18 DIAGNOSIS — I428 Other cardiomyopathies: Secondary | ICD-10-CM | POA: Diagnosis not present

## 2022-08-18 NOTE — Patient Instructions (Signed)
    Follow-Up: At [facility], you and your health needs are our priority.  As part of our continuing mission to provide you with exceptional heart care, we have created designated Provider Care Teams.  These Care Teams include your primary Cardiologist (physician) and Advanced Practice Providers (APPs -  Physician Assistants and Nurse Practitioners) who all work together to provide you with the care you need, when you need it.  We recommend signing up for the patient portal called "MyChart".  Sign up information is provided on this After Visit Summary.  MyChart is used to connect with patients for Virtual Visits (Telemedicine).  Patients are able to view lab/test results, encounter notes, upcoming appointments, etc.  Non-urgent messages can be sent to your provider as well.   To learn more about what you can do with MyChart, go to [email or web address].    Your next appointment:   6 month(s)  Provider:   Brian Crenshaw, MD      

## 2022-08-21 ENCOUNTER — Other Ambulatory Visit: Payer: Self-pay | Admitting: Pulmonary Disease

## 2022-08-21 ENCOUNTER — Other Ambulatory Visit (HOSPITAL_COMMUNITY): Payer: Self-pay

## 2022-08-21 ENCOUNTER — Ambulatory Visit: Payer: PPO | Attending: Internal Medicine

## 2022-08-21 ENCOUNTER — Encounter: Payer: Self-pay | Admitting: *Deleted

## 2022-08-21 DIAGNOSIS — Z95810 Presence of automatic (implantable) cardiac defibrillator: Secondary | ICD-10-CM | POA: Diagnosis not present

## 2022-08-21 DIAGNOSIS — I5042 Chronic combined systolic (congestive) and diastolic (congestive) heart failure: Secondary | ICD-10-CM

## 2022-08-21 NOTE — Progress Notes (Signed)
EPIC Encounter for ICM Monitoring  Patient Name: William Villanueva is a 72 y.o. male Date: 08/21/2022 Primary Care Physican: Tally Joe, MD Primary Cardiologist: Jens Som Electrophysiologist: Joycelyn Schmid Pacing: 94%            07/17/2022 Weight:  217 lbs    AT/AF Burden <1%                                                            Spoke with patient and heart failure questions reviewed.  Transmission results reviewed.  Pt asymptomatic for fluid accumulation.  Pt was out of Furosemide tablets x 1 month during decreased impedance.                 CorVue thoracic daily impedance suggesting possible fluid accumulation from 4/1-4/12 and return to normal 4/13.   Prescribed:   Furosemide 40 mg Take 2 tablet(2) (80 mg total) by mouth daily.  Spironolactone 25 mg take 0.5 tablet (12.5 mg total) once a day   Labs: 08/11/2022 Creatinine 1.25, BUN 26, Potassium 4.4, Sodium 142, GFR >60 06/27/2022 Creatinine 0.94, BUN 21, Potassium 4.6, Sodium 140, GFR 87 06/03/2022 Creatinine 1.17, BUN 24, Potassium 3.8, Sodium 131, GFR >60 05/25/2022 Creatinine 1.11, BUN 22, Potassium 4.1, Sodium 138 A complete set of results can be found in Results Review.   Recommendations:  No changes and encouraged to call if experiencing any fluid symptoms.   Follow-up plan: ICM clinic phone appointment on 09/25/2022.   91 day device clinic remote transmission 10/17/2022.     EP/Cardiology Office Visits:   Recall 02/14/2023 with Dr Jens Som.  10/17/2022 with Otilio Saber, PA.   Copy of ICM check sent to Dr. Graciela Husbands.    3 month ICM trend: 08/21/2022.    12-14 Month ICM trend:     Karie Soda, RN 08/21/2022 9:22 AM

## 2022-08-28 ENCOUNTER — Ambulatory Visit: Payer: PPO | Attending: Internal Medicine | Admitting: Internal Medicine

## 2022-08-28 ENCOUNTER — Encounter: Payer: Self-pay | Admitting: Internal Medicine

## 2022-08-28 VITALS — BP 129/71 | HR 86 | Resp 16 | Ht 70.0 in | Wt 235.0 lb

## 2022-08-28 DIAGNOSIS — M79641 Pain in right hand: Secondary | ICD-10-CM | POA: Diagnosis not present

## 2022-08-28 DIAGNOSIS — M159 Polyosteoarthritis, unspecified: Secondary | ICD-10-CM

## 2022-08-28 DIAGNOSIS — M1A09X0 Idiopathic chronic gout, multiple sites, without tophus (tophi): Secondary | ICD-10-CM

## 2022-08-28 NOTE — Progress Notes (Signed)
Office Visit Note  Patient: William Villanueva             Date of Birth: 1950-12-03           MRN: [id number]             PCP: Tally Joe, MD Referring: Tally Joe, MD Visit Date: 08/28/2022   Subjective:  Follow-up (Patient states the injections worked.)   History of Present Illness: William Villanueva is a 72 y.o. male here for follow up for rheumatoid arthritis and gout on allopurinol 200 mg daily.  He felt a lot of benefit with the joint steroid injections from previous visit his right hand and left shoulder.  The shoulder remains largely improved although he still has mild symptoms there.  Right hand is giving him more trouble again especially at the thumb he is using a brace that is partially helpful.  No new definite gout flareups of inflammation.   Previous HPI 04/07/22 William Villanueva is a 72 y.o. male here for follow up for rheumatoid arthritis and gout currently on allopurinol increased to 200 mg daily after our initial visit. He continues having joint pain and stiffness at multiple areas. Most problematic are his right thumb and his left shoulder. Swelling stays mostly at his feet and ankles. He got sick with RSV and delayed his planned pacemaker battery replacement so that is coming up. Still has a bit of increased cough mostly nonproductive.    Previous HPI 02/23/22 William Villanueva is a 72 y.o. male here for rheumatoid arthritis. He was previously seeing [facility] rheumatology on treatment with methotrexate and had been on allopurinol and uloric for gout. He has history of carpal tunnel syndrome with surgical treatment for the left wrist.  Original diagnosis was RA he had joint pain in multiple areas with abnormal laboratory test.  He was started on injectable methotrexate but switched to oral methotrexate due to pain and difficulty with the injections.  He stopped following up with her clinic due to dissatisfaction including never seeing his MD and  not addressing any  complaints not directly attributable to his inflammatory arthritis.  As result he has been off any disease specific medication for over a year does feel he has increased in joint pains off of treatment.  He had also been off of the Uloric they were prescribing but was restarted on allopurinol in the past few months with his PCP office.  He reports 1 flare of gout in his foot within the past month but before that had been free of any attacks for several months duration.  He still has residual swelling and erythema in the left foot but was told this may be coming from his heart failure edema as well.   Generally has daily joint pain in multiple areas particular including bilateral hands and in his back.  He gets foot and ankle pain most severe during gout flares has mild symptoms otherwise.  More recently also developed increased left shoulder pain for several days duration for which he went to urgent care for evaluation of this but ended up going to the hospital due to hypoxia and increased cough so the original problem was never addressed.  Treatment course at the hospital for congestive heart failure exacerbation and improved after several days.  He still had increased cough producing significant discolored sputum and received 1 dose of IV ceftriaxone.  X-ray of the shoulder was obtained demonstrated mild glenohumeral joint osteoarthritis and moderate AC joint osteoarthritis.   Labs reviewed  10/2021 Uric acid 9.7 eGFR 59   Review of Systems  Constitutional:  Positive for fatigue.  HENT:  Positive for mouth dryness. Negative for mouth sores.   Eyes:  Positive for dryness.  Respiratory:  Positive for shortness of breath.   Cardiovascular:  Positive for chest pain. Negative for palpitations.  Gastrointestinal:  Positive for constipation and diarrhea. Negative for blood in stool.  Endocrine: Negative for increased urination.  Genitourinary:  Positive for involuntary urination.  Musculoskeletal:   Positive for joint pain, gait problem, joint pain, myalgias, morning stiffness and myalgias. Negative for joint swelling, muscle weakness and muscle tenderness.  Skin:  Positive for color change. Negative for rash, hair loss and sensitivity to sunlight.  Allergic/Immunologic: Negative for susceptible to infections.  Neurological:  Negative for dizziness and headaches.  Hematological:  Negative for swollen glands.  Psychiatric/Behavioral:  Positive for sleep disturbance. Negative for depressed mood. The patient is not nervous/anxious.     PMFS History:  Patient Active Problem List   Diagnosis Date Noted   Asthmatic bronchitis with acute exacerbation 04/07/2022   Rheumatoid arthritis (HCC) 02/23/2022   High risk medication use 02/23/2022   COPD (chronic obstructive pulmonary disease) (HCC) 02/10/2022   Acute kidney injury superimposed on chronic kidney disease (HCC) 02/08/2022   Class 2 obesity 02/08/2022   Acute on chronic combined systolic (congestive) and diastolic (congestive) heart failure (HCC) 02/04/2022   DM2 (diabetes mellitus, type 2) (HCC) 02/04/2022   Acute on chronic combined systolic and diastolic CHF (congestive heart failure) (HCC) 02/04/2022   Nocturnal hypoxemia 12/30/2021   Epistaxis 12/12/2021   Non-ischemic cardiomyopathy (HCC) 07/29/2020   Hemorrhagic shock (HCC) 02/18/2020   Acute blood loss anemia 02/18/2020   Postural dizziness with presyncope 02/18/2020   Ulcerative colitis, acute, unspecified complication (HCC) 02/18/2020   Diarrhea 02/12/2020   Generalized abdominal pain 02/12/2020   Thrombocytosis 02/12/2020   Sprain of left wrist 08/12/2019   CHF (congestive heart failure) (HCC) 07/17/2017   COPD with acute exacerbation (HCC) 06/14/2017   Hyponatremia 05/27/2016   Hypokalemia 05/27/2016   Depression 04/05/2016   Kidney stones 04/05/2016   Osteoarthritis 04/05/2016   OSA (obstructive sleep apnea) 04/05/2016   Bilateral carpal tunnel syndrome  12/02/2015   Chronic combined systolic and diastolic congestive heart failure (HCC) 04/13/2015   Orthostatic hypotension 01/19/2015   Obesity 11/26/2014   Enlarged prostate without lower urinary tract symptoms (luts) 10/29/2014   Essential hypertension 10/29/2014   GERD (gastroesophageal reflux disease) 10/29/2014   Panic attack 10/29/2014   Changing skin lesion 10/16/2014   Renal cyst 09/02/2014   Pacemaker lead malfunction-elevated threshold RV lead 07/31/2014   Dyspnea on exertion 06/21/2014   Gastroenteritis, acute 06/15/2014   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   Palpitations 06/11/2014   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   Other cardiomyopathies (HCC) 04/29/2014   Bradycardia 04/28/2014   Chronic pain 02/12/2014   Thrombocythemia 12/01/2013   Muscular wasting and disuse atrophy 08/15/2013   Lumbar and sacral osteoarthritis 05/19/2013   Congestive dilated cardiomyopathy (HCC) 01/11/2012   Compulsive tobacco user syndrome 12/13/2011   Current tobacco use 12/13/2011   Chest tightness    Rash    Back pain    Hemochromatosis    SOB (shortness of breath)    Gout    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 Jude Allure Quadra pulse generator [id number], model PM 3242   Back pain  Cardiomyopathy (HCC)    Nonischemic 45%.    CHF (congestive heart failure) (HCC)    Diabetes mellitus without complication (HCC)    Gout    Hemochromatosis    Hypertension    Hypospadias 08/07/1950   born with   Nephrolithiasis    Polycythemia vera(238.4)     Family History  Problem Relation Age of Onset   Stroke Mother    Other Father        Deceased, car fell on him   Diabetes Sister    Hypertension Sister    Heart disease Maternal Grandmother        Pacemaker, MI   Past Surgical History:  Procedure Laterality Date   ANKLE SURGERY     BI-VENTRICULAR PACEMAKER INSERTION N/A 04/29/2014   Procedure:  BI-VENTRICULAR PACEMAKER INSERTION (CRT-P);  Surgeon: Duke Salvia, MD; Laterality: Left  St. Jude Allure Quadra pulse generator [phone], model [place] 4540   CARDIAC SURGERY     COLONOSCOPY N/A 02/15/2020   Procedure: COLONOSCOPY;  Surgeon: Kerin Salen, MD;  Location: [facility] ENDOSCOPY;  Service: Gastroenterology;  Laterality: N/A;   ESOPHAGOGASTRODUODENOSCOPY (EGD) WITH PROPOFOL N/A 02/15/2020   Procedure: ESOPHAGOGASTRODUODENOSCOPY (EGD) WITH PROPOFOL;  Surgeon: Kerin Salen, MD;  Location: [facility] ENDOSCOPY;  Service: Gastroenterology;  Laterality: N/A;   LEAD INSERTION N/A 04/28/2022   Procedure: LEAD INSERTION;  Surgeon: Duke Salvia, MD;  Location: [facility] INVASIVE CV LAB;  Service: Cardiovascular;  Laterality: N/A;   PILONIDAL CYST EXCISION     POSTERIOR LAMINECTOMY / DECOMPRESSION LUMBAR SPINE     PPM GENERATOR CHANGEOUT N/A 04/28/2022   Procedure: PPM GENERATOR CHANGEOUT;  Surgeon: Duke Salvia, MD;  Location: [facility] INVASIVE CV LAB;  Service: Cardiovascular;  Laterality: N/A;   TONSILLECTOMY     Social History   Social History Narrative   Lives at home with wife in a one story home.  Has no children.  Does not work.  Getting workman's comp.  Education: 4 years trade school.    Immunization History  Administered Date(s) Administered   COVID-19, mRNA, vaccine(Comirnaty)12 years and older 03/02/2022   DTaP 01/26/2016   Fluad Quad(high Dose 65+) 03/07/2021, 03/02/2022   Influenza Split 01/21/2015   Influenza, High Dose Seasonal PF 02/23/2017, 03/22/2018, 01/10/2019, 03/01/2020   Influenza,inj,Quad PF,6+ Mos 01/21/2015, 01/27/2016, 02/05/2017   Influenza-Unspecified 01/26/2016   PFIZER Comirnaty(Gray Top)Covid-19 Tri-Sucrose Vaccine 09/10/2020   PFIZER(Purple Top)SARS-COV-2 Vaccination 06/21/2019, 07/13/2019, 04/16/2020, 09/10/2020   Pfizer Covid-19 Vaccine Bivalent Booster 49yrs & up 03/07/2021   Pneumococcal Conjugate-13 01/27/2016, 01/09/2018   Pneumococcal Polysaccharide-23 01/21/2015    Pneumococcal-Unspecified 01/26/2016   Tdap 01/26/2011, 01/27/2016, 08/09/2019   Zoster, Live 08/29/2019     Objective: Vital Signs: BP 129/71 (BP Location: Left Arm, Patient Position: Sitting, Cuff Size: Normal)   Pulse 86   Resp 16   Ht 5\' 10"  (1.778 m)   Wt 235 lb (106.6 kg)   BMI 33.72 kg/m    Physical Exam Eyes:     Conjunctiva/sclera: Conjunctivae normal.  Cardiovascular:     Rate and Rhythm: Normal rate and regular rhythm.  Pulmonary:     Effort: Pulmonary effort is normal.     Breath sounds: Normal breath sounds.  Lymphadenopathy:     Cervical: No cervical adenopathy.  Skin:    General: Skin is warm and dry.     Comments: 1+ pedal edema bilaterally  Neurological:     Mental Status: He is alert.  Psychiatric:        Mood and Affect: Mood  normal.      Musculoskeletal Exam:  Shoulders with painful abduction external rotation some guarding worse on the right side with no palpable swelling Elbows full ROM no tenderness or swelling Wrists full ROM no tenderness or swelling Fingers full ROM no tenderness or swelling Knees full ROM no tenderness or swelling patellofemoral crepitus bilateral   Investigation: No additional findings.  Imaging: No results found.  Recent Labs: Lab Results  Component Value Date   WBC 6.4 08/11/2022   HGB 12.3 (L) 08/11/2022   PLT 455 (H) 08/11/2022   NA 142 08/11/2022   K 4.4 08/11/2022   CL 104 08/11/2022   CO2 29 08/11/2022   GLUCOSE 104 (H) 08/11/2022   BUN 26 (H) 08/11/2022   CREATININE 1.25 (H) 08/11/2022   BILITOT 0.7 08/11/2022   ALKPHOS 63 08/11/2022   AST 13 (L) 08/11/2022   ALT 8 08/11/2022   PROT 7.1 08/11/2022   ALBUMIN 4.1 08/11/2022   CALCIUM 8.7 (L) 08/11/2022   GFRAA 83 05/10/2020    Speciality Comments: No specialty comments available.  Procedures:  Large Joint Inj: R subacromial bursa on 08/28/2022 3:40 PM Indications: pain Details: 27 G 1.5 in needle, posterior approach Medications: 2 mL  lidocaine 1 %; 40 mg triamcinolone acetonide 40 MG/ML Outcome: tolerated well, no immediate complications Procedure, treatment alternatives, risks and benefits explained, specific risks discussed. Consent was given by the patient. Immediately prior to procedure a time out was called to verify the correct patient, procedure, equipment, support staff and site/side marked as required. Patient was prepped and draped in the usual sterile fashion.     Allergies: Advil [ibuprofen], Keflex [cephalexin], Wellbutrin [bupropion], Prednisone, Temazepam, Trazodone and nefazodone, Sacubitril-valsartan, and Prozac [fluoxetine hcl]   Assessment / Plan:     Visit Diagnoses: Chronic gout of multiple sites, unspecified cause - Plan: Sedimentation rate, Uric acid  Gout appears to be fairly well-controlled no severe new flareups.  Will check sed rate also check uric acid level on the 200 mg allopurinol dose.  If he remains above goal would recommend titration to 300 mg daily.  Primary osteoarthritis involving multiple joints  More of the current symptom complaints and shoulder and hands appear related to underlying degenerative arthritis.  Doing the turmeric and topical diclofenac as needed.  Had a very good benefit in the left shoulder so proceeded with right shoulder subacromial bursa injection today which was well-tolerated.  Orders: Orders Placed This Encounter  Procedures   Large Joint Inj: R subacromial bursa   Sedimentation rate   Uric acid   No orders of the defined types were placed in this encounter.    Follow-Up Instructions: Return in about 3 months (around 11/27/2022) for Gout/OA/?RA f/u 3mos.   Fuller Plan, MD  Note - This record has been created using [employer].  Chart creation errors have been sought, but may not always  have been located. Such creation errors do not reflect on  the standard of medical care.

## 2022-08-29 LAB — URIC ACID: Uric Acid, Serum: 6.9 mg/dL (ref 4.0–8.0)

## 2022-08-29 LAB — SEDIMENTATION RATE: Sed Rate: 14 mm/h (ref 0–20)

## 2022-09-04 ENCOUNTER — Telehealth: Payer: Self-pay | Admitting: *Deleted

## 2022-09-04 DIAGNOSIS — M1A09X0 Idiopathic chronic gout, multiple sites, without tophus (tophi): Secondary | ICD-10-CM

## 2022-09-04 NOTE — Telephone Encounter (Signed)
Patient requesting lab results Please advise 

## 2022-09-04 NOTE — Telephone Encounter (Signed)
Dr. Merita Norton office called, William Villanueva, patient called office with left fingers, 2nd - 5th digits, pain, swelling, warm to the touch and numbness. Diabetic glucose has been within range. Recommendation and thoughts? [phone] Katie.

## 2022-09-05 NOTE — Telephone Encounter (Signed)
I called patient, patient verbalized understanding. 

## 2022-09-05 NOTE — Telephone Encounter (Signed)
Uric acid remains slightly above  at 6.9. If he is having ongoing inflammation can titrate allopurinol to 1.5 tablets. I would wait until out of a current flare to adjust this.

## 2022-09-05 NOTE — Telephone Encounter (Signed)
I left a message with their office plan is for William Villanueva to call back today and gave my number. If we don't hear back will plan to call William Villanueva directly. Sounds like flare up of gout vs RA. He has previous intolerance to oral prednisone. If not taking colchicine already can try this. Otherwise could try medrol or dexamethasone. If none of the above could ask him about coming for possible injection.

## 2022-09-06 MED ORDER — COLCHICINE 0.6 MG PO TABS: ORAL_TABLET | ORAL | 1 refills | Status: DC

## 2022-09-06 NOTE — Telephone Encounter (Signed)
Colchicine is the generic name. The brand names of this are Colcrys (tablet) and Mitigare (capsule). This drug is sometimes a pain with high copay on insurance. If so he can check the GoodRx for it is usually about [facility]20-[facility] at [facility] or [facility].

## 2022-09-06 NOTE — Addendum Note (Signed)
Addended by: Fuller Plan on: 09/06/2022 10:32 AM   Modules accepted: Orders

## 2022-09-06 NOTE — Telephone Encounter (Signed)
Attempted to contact the patient and left a message to call the office back. 

## 2022-09-06 NOTE — Telephone Encounter (Signed)
Yes he can take both at the same time.

## 2022-09-06 NOTE — Telephone Encounter (Signed)
Patient advised Colchicine is the generic name. The brand names of this are Colcrys (tablet) and Mitigare (capsule). This drug is sometimes a pain with high copay on insurance. If so he can check the GoodRx for it is usually about $20-25 at [facility] or [facility]. Advised patient a prescription for Colchicine was sent to [facility] on [address].  Patient inquires if he is able to take the Allopurinol while taking Colchicine. Patient call back number is [phone]. Please advise.

## 2022-09-06 NOTE — Telephone Encounter (Signed)
Patient advised Sounds like flare up of gout vs RA. He has previous intolerance to oral prednisone. If not taking colchicine already can try this. Otherwise could try medrol or dexamethasone. If none of the above could ask him about coming for possible injection. Patient states he is not currently on Colchicine. Patient states he would like to know if the Colchicine is brand name or generic. Patient inquires if he can still take his Allopurinol with the Colchicine. Patient call back number is [phone]. Please advise.

## 2022-09-07 NOTE — Telephone Encounter (Signed)
Contacted the patient and advised per Dr. Dimple Casey Yes he can take both at the same time. Patient verbalized understanding.

## 2022-09-12 NOTE — Progress Notes (Signed)
Remote ICD transmission.   

## 2022-09-20 ENCOUNTER — Ambulatory Visit: Payer: PPO | Attending: Internal Medicine

## 2022-09-20 DIAGNOSIS — Z95810 Presence of automatic (implantable) cardiac defibrillator: Secondary | ICD-10-CM | POA: Diagnosis not present

## 2022-09-20 DIAGNOSIS — I5042 Chronic combined systolic (congestive) and diastolic (congestive) heart failure: Secondary | ICD-10-CM

## 2022-09-20 NOTE — Progress Notes (Signed)
EPIC Encounter for ICM Monitoring  Patient Name: William Villanueva is a 72 y.o. male Date: 09/20/2022 Primary Care Physican: Tally Joe, MD Primary Cardiologist: Jens Som Electrophysiologist: Joycelyn Schmid Pacing: 89%            07/17/2022 Weight:  217 lbs 09/20/2022 Weight: 227 lbs    AT/AF Burden 0%                                                            Spoke with patient and heart failure questions reviewed.  Transmission results reviewed.  Pt asymptomatic for fluid accumulation.  Reports feeling well at this time and voices no complaints.                   CorVue thoracic daily impedance normal but was suggesting possible fluid accumulation from 4/1-4/19 (out of Furosemide x 1 month) followed by possible dryness from 4/25-5/5.   Prescribed:   Furosemide 40 mg Take 2 tablet(2) (80 mg total) by mouth daily.  Spironolactone 25 mg take 0.5 tablet (12.5 mg total) once a day   Labs: 08/11/2022 Creatinine 1.25, BUN 26, Potassium 4.4, Sodium 142, GFR >60 06/27/2022 Creatinine 0.94, BUN 21, Potassium 4.6, Sodium 140, GFR 87 06/03/2022 Creatinine 1.17, BUN 24, Potassium 3.8, Sodium 131, GFR >60 05/25/2022 Creatinine 1.11, BUN 22, Potassium 4.1, Sodium 138 A complete set of results can be found in Results Review.   Recommendations:  No changes and encouraged to call if experiencing any fluid symptoms.   Follow-up plan: ICM clinic phone appointment on 10/23/2022.   91 day device clinic remote transmission 10/31/2022.     EP/Cardiology Office Visits:   Recall 02/14/2023 with Dr Jens Som.  10/17/2022 with Otilio Saber, PA.   Copy of ICM check sent to Dr. Graciela Husbands.    3 month ICM trend: 09/17/2022.    12-14 Month ICM trend:     Karie Soda, RN 09/20/2022 7:15 AM

## 2022-09-21 ENCOUNTER — Other Ambulatory Visit: Payer: Self-pay | Admitting: Pulmonary Disease

## 2022-09-21 ENCOUNTER — Other Ambulatory Visit (HOSPITAL_COMMUNITY): Payer: Self-pay

## 2022-09-25 ENCOUNTER — Other Ambulatory Visit (HOSPITAL_COMMUNITY): Payer: Self-pay

## 2022-09-25 MED ORDER — LIDOCAINE HCL 1 % IJ SOLN
2.0000 mL | INTRAMUSCULAR | Status: AC | PRN
  Administered 2022-08-28: 2 mL

## 2022-09-25 MED ORDER — TRIAMCINOLONE ACETONIDE 40 MG/ML IJ SUSP
40.0000 mg | INTRAMUSCULAR | Status: AC | PRN
  Administered 2022-08-28: 40 mg via INTRA_ARTICULAR

## 2022-09-28 ENCOUNTER — Other Ambulatory Visit (HOSPITAL_COMMUNITY): Payer: Self-pay

## 2022-09-28 ENCOUNTER — Other Ambulatory Visit: Payer: Self-pay

## 2022-09-28 ENCOUNTER — Telehealth: Payer: Self-pay | Admitting: Internal Medicine

## 2022-09-28 MED ORDER — BUDESONIDE 0.5 MG/2ML IN SUSP
2.0000 mL | Freq: Two times a day (BID) | RESPIRATORY_TRACT | 11 refills | Status: DC
  Filled 2022-09-28: qty 360, 90d supply, fill #0
  Filled 2023-03-28: qty 360, 90d supply, fill #1

## 2022-09-28 NOTE — Telephone Encounter (Signed)
Rx sent to [facility] mail order pharmacy. Nothing further needed.

## 2022-09-28 NOTE — Telephone Encounter (Signed)
[facility] [employer] called to request a refill for the patient's budesonide (PULMICORT) 0.5 MG/2ML nebulizer solution , to go to the [facility] order pharmacy, Tel:  [phone].  Please advise.

## 2022-09-29 ENCOUNTER — Other Ambulatory Visit: Payer: Self-pay

## 2022-10-05 ENCOUNTER — Telehealth: Payer: Self-pay | Admitting: Internal Medicine

## 2022-10-05 NOTE — Telephone Encounter (Signed)
Patient called to see if Dr. Dimple Casey could take over his prescription of Folic Acid.  Patient states he is out of medication.  Patient's pharmacy is [facility].

## 2022-10-05 NOTE — Telephone Encounter (Signed)
Patient is currently prescribed Colchicine and Allopurinol. Please advise.

## 2022-10-06 DIAGNOSIS — H532 Diplopia: Secondary | ICD-10-CM | POA: Diagnosis not present

## 2022-10-09 ENCOUNTER — Ambulatory Visit (INDEPENDENT_AMBULATORY_CARE_PROVIDER_SITE_OTHER): Payer: PPO

## 2022-10-09 ENCOUNTER — Encounter: Payer: Self-pay | Admitting: Hematology & Oncology

## 2022-10-09 ENCOUNTER — Encounter: Payer: Self-pay | Admitting: Podiatry

## 2022-10-09 ENCOUNTER — Ambulatory Visit (INDEPENDENT_AMBULATORY_CARE_PROVIDER_SITE_OTHER): Payer: PPO | Admitting: Podiatry

## 2022-10-09 DIAGNOSIS — R350 Frequency of micturition: Secondary | ICD-10-CM | POA: Insufficient documentation

## 2022-10-09 DIAGNOSIS — D224 Melanocytic nevi of scalp and neck: Secondary | ICD-10-CM | POA: Insufficient documentation

## 2022-10-09 DIAGNOSIS — M778 Other enthesopathies, not elsewhere classified: Secondary | ICD-10-CM

## 2022-10-09 DIAGNOSIS — S32009S Unspecified fracture of unspecified lumbar vertebra, sequela: Secondary | ICD-10-CM | POA: Insufficient documentation

## 2022-10-09 DIAGNOSIS — L309 Dermatitis, unspecified: Secondary | ICD-10-CM

## 2022-10-09 HISTORY — DX: Unspecified fracture of unspecified lumbar vertebra, sequela: S32.009S

## 2022-10-09 MED ORDER — TRIAMCINOLONE ACETONIDE 0.1 % EX CREA: 1.0000 | TOPICAL_CREAM | Freq: Two times a day (BID) | CUTANEOUS | 0 refills | Status: DC

## 2022-10-09 NOTE — Progress Notes (Addendum)
  Subjective:  Patient ID: William Villanueva, male    DOB: Feb 13, 1951,   MRN: [id number]  Chief Complaint  Patient presents with   Foot Pain    Lateral 5th MPJ left - small cut, states whole foot has been hurting for 4-5 months, worsened over the past week, feels swollen and splotchy red patches, doesn't remember and injury, history of gout, diabetic - last A1c was 5.4   New Patient (Initial Visit)    Est pt 2017    72 y.o. male presents for concern of left foot pain that has been going on for 4-5 months. .  Relates the foot has been swollen and pinkish in color. States there was a small cut near his fifth toe. It has worsened over the past week and does not recall any injury. He does have a history of gout.  Patient is diabetic and last A1c was  Lab Results  Component Value Date   HGBA1C 5.4 02/04/2022   .   PCP:  Tally Joe, MD    . Denies any other pedal complaints. Denies n/v/f/c.   Past Medical History:  Diagnosis Date   AV block, 2nd degree 2015   St. Jude Allure Quadra pulse generator [id number], model PM 3242   Back pain    Cardiomyopathy (HCC)    Nonischemic 45%.    CHF (congestive heart failure) (HCC)    Diabetes mellitus without complication (HCC)    Gout    Hemochromatosis    Hypertension    Hypospadias August 01, 1950   born with   Nephrolithiasis    Polycythemia vera(238.4)     Objective:  Physical Exam: Vascular: DP/PT pulses 2/4 bilateral. CFT <3 seconds. Normal hair growth on digits. No edema.  Skin. No lacerations or abrasions bilateral feet. Left foot lateral border with some erythematous nodules noted and small fissues on the lateral aspect of the fifth metatarsal shaft.  Musculoskeletal: MMT 5/5 bilateral lower extremities in DF, PF, Inversion and Eversion. Deceased ROM in DF of ankle joint. No pain to fifth MPJ and no pain with ROM. No pain to dorsal aspect of the fifth metatarsal. All pain noted to the lateral fifth metatarsal in the neck and diaphysis area.   Neurological: Sensation intact to light touch.   Assessment:   1. Capsulitis of foot, left   2. Dermatitis      Plan:  Patient was evaluated and treated and all questions answered. -Xrays reviewed. No acute fractures or dislocatiosn. Some cystic changes noted at the fifth MPJ that could be consistant with gout.  -Discussed treatement options for gouty arthritis and gout education provided. -Patient has had multiple steroid injections recently and would like to avoid. Due to possible some dermatiits as well with send in triamcinalone.  -Discussed diet and modifications.  -Surgical shoe deferred -Ordered arthritic lab panel; will call patient with results if abnormal -Advised patient to call if symptoms are not improved within 1 week -Patient to return in 3 weeks for re-check/further discussion for long term management of gout or sooner if condition worsens.   Louann Sjogren, DPM

## 2022-10-09 NOTE — Addendum Note (Signed)
Addended by: Louann Sjogren R on: 10/09/2022 04:20 PM   Modules accepted: Orders

## 2022-10-10 LAB — URIC ACID: Uric Acid, Serum: 4.9 mg/dL (ref 4.0–8.0)

## 2022-10-13 ENCOUNTER — Ambulatory Visit: Payer: PPO | Admitting: Podiatry

## 2022-10-15 NOTE — Progress Notes (Unsigned)
  Electrophysiology Office Note:   Date:  10/15/2022  ID:  William Villanueva, DOB 22-Feb-1951, MRN [id number]  Primary Cardiologist: Olga Millers, MD Electrophysiologist: Sherryl Manges, MD   History of Present Illness:   William Villanueva is a 72 y.o. male with h/o NICM, Chronic systolic CHF, HTN, Obesity, and orthostatic hypotension seen today for routine electrophysiology followup.   Seen by Dr. Graciela Husbands 08/10/2022. Complained of pocket tenderness without erythema or calor s/p upgrade 04/2022. ***  Since last being seen in our clinic the patient reports doing ***.  he denies chest pain, palpitations, dyspnea, PND, orthopnea, nausea, vomiting, dizziness, syncope, edema, weight gain, or early satiety.   Review of systems complete and found to be negative unless listed in HPI.   Device History: Abbott Dual Chamber ICD implanted 04/2014 for High grade AV, CRT-D upgrade 04/2022  Studies Reviewed:    ICD Interrogation-  reviewed in detail today,  See PACEART report.  EKG is not ordered today. EKG from 08/18/2022 reviewed which showed AS-VP 94 bpm  {Select studies to display:26339}   Risk Assessment/Calculations:   {Does this patient have ATRIAL FIBRILLATION?[phone]} No BP recorded.  {Refresh Note OR Click here to enter BP  :1}***        Physical Exam:   VS:  There were no vitals taken for this visit.   Wt Readings from Last 3 Encounters:  08/28/22 235 lb (106.6 kg)  08/18/22 233 lb 9.6 oz (106 kg)  08/11/22 230 lb (104.3 kg)     GEN: Well nourished, well developed in no acute distress NECK: No JVD; No carotid bruits CARDIAC: {EPRHYTHM:28826}, no murmurs, rubs, gallops RESPIRATORY:  Clear to auscultation without rales, wheezing or rhonchi  ABDOMEN: Soft, non-tender, non-distended EXTREMITIES:  No edema; No deformity   ASSESSMENT AND PLAN:    NICM   Pocket discomfort   CHF chronic sys/HFpFF   Hypertension   Morbidly obese   ICD-CRT-St. Jude (high-voltage upgrade 12/23)    Orthostatic hypotension   euvolemic today Stable on an appropriate medical regimen Normal ICD function at last check. Not interrogated today.  See Arita Miss Art report No changes today  Losartan stopped at last visit. Todays BP ***  Volume status stable. Not sure he would tolerate SGLT2 in the setting of his orthostasis.  Pocket ***  {Click here to Review PMH, Prob List, Meds, Allergies, SHx, FHx  :1}   Disposition:   Follow up with {EPPROVIDERS:28135} {EPFOLLOW UP:28173}   Signed, Graciella Freer, PA-C

## 2022-10-16 ENCOUNTER — Other Ambulatory Visit (HOSPITAL_COMMUNITY): Payer: Self-pay

## 2022-10-16 ENCOUNTER — Encounter: Payer: Self-pay | Admitting: Internal Medicine

## 2022-10-16 ENCOUNTER — Ambulatory Visit: Payer: PPO | Admitting: Internal Medicine

## 2022-10-16 ENCOUNTER — Ambulatory Visit (INDEPENDENT_AMBULATORY_CARE_PROVIDER_SITE_OTHER): Payer: PPO | Admitting: Internal Medicine

## 2022-10-16 VITALS — BP 134/80 | HR 76 | Temp 97.5°F | Ht 70.0 in | Wt 236.0 lb

## 2022-10-16 DIAGNOSIS — J439 Emphysema, unspecified: Secondary | ICD-10-CM

## 2022-10-16 DIAGNOSIS — R1310 Dysphagia, unspecified: Secondary | ICD-10-CM

## 2022-10-16 DIAGNOSIS — J441 Chronic obstructive pulmonary disease with (acute) exacerbation: Secondary | ICD-10-CM

## 2022-10-16 DIAGNOSIS — J4489 Other specified chronic obstructive pulmonary disease: Secondary | ICD-10-CM

## 2022-10-16 DIAGNOSIS — H532 Diplopia: Secondary | ICD-10-CM | POA: Diagnosis not present

## 2022-10-16 DIAGNOSIS — I5023 Acute on chronic systolic (congestive) heart failure: Secondary | ICD-10-CM

## 2022-10-16 NOTE — Patient Instructions (Addendum)
Please schedule follow up scheduled with Wert or APP in 3 months.    I have ordered a barium swallow test to see where the food is getting "stuck" in your throat  Take the pulmicort nebulizer treatments up to 2 times a day for shortness of breath for COPD.  You can take the duoneb treatments up to 4 times a day as needed for shortness of breath.   Your current shortness of breath is related to fluid build up from heart failure. Take lasix twice a day (one 40 mg pill morning, one 40 mg pill afternoon)  this should help the swelling in your leg and the weight gain.   You are scheduled for LDCT for lung cancer screening August 15 of this year.

## 2022-10-16 NOTE — Progress Notes (Signed)
William Villanueva    [id number]    Jul 06, 1950  Primary Care Physician:Swayne, Onalee Hua, MD Date of Appointment: 10/16/2022 Established Patient Visit  Chief complaint:   Chief Complaint  Patient presents with   Acute Visit    Increased SOB, can't breath while sleeping      HPI: William Villanueva is a 72 y.o. man with past medical history  former tobacco use disorder(quit 2014), NICM EF 45%, s/p dual chamber PPM, and COPD FEV1 58% of predicted. Sees Dr. Sherene Sires.   Interval Updates: Here for acute visit. Last seen 5 months ago.   Current medications are pulmicort nebs once daily, and prn duonebs. Having worsening shortness of breath. Orthopnea x 2 weeks.   No worsening cough. He does have shortness of breath with exertion. No wheezing. No chest tightness.   Having worsening lower extremity edema. 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  He is taking lasix 40 mg once daily in the mornings, makes him pee a lot.   Works as a Personnel officer.   Weight has been up 6 lbs in the last few weeks.   He is having trouble taking pills and certain foods and feels like it's getting stuck in his throat.   I have reviewed the patient's family social and past medical history and updated as appropriate.   Past Medical History:  Diagnosis Date   AV block, 2nd degree 2015   St. Jude Allure Quadra pulse generator [id number], model PM 3242   Back pain    Cardiomyopathy (HCC)    Nonischemic 45%.    CHF (congestive heart failure) (HCC)    Diabetes mellitus without complication (HCC)    Gout    Hemochromatosis    Hypertension    Hypospadias 07/30/1950   born with   Nephrolithiasis    Polycythemia vera(238.4)     Past Surgical History:  Procedure Laterality Date   ANKLE SURGERY     BI-VENTRICULAR PACEMAKER INSERTION N/A 04/29/2014   Procedure: BI-VENTRICULAR PACEMAKER INSERTION (CRT-P);  Surgeon: Duke Salvia, MD; Laterality: Left  St. Jude Allure Quadra pulse generator [id number], model [place]  1308   CARDIAC SURGERY     COLONOSCOPY N/A 02/15/2020   Procedure: COLONOSCOPY;  Surgeon: Kerin Salen, MD;  Location: [facility] ENDOSCOPY;  Service: Gastroenterology;  Laterality: N/A;   ESOPHAGOGASTRODUODENOSCOPY (EGD) WITH PROPOFOL N/A 02/15/2020   Procedure: ESOPHAGOGASTRODUODENOSCOPY (EGD) WITH PROPOFOL;  Surgeon: Kerin Salen, MD;  Location: [facility] ENDOSCOPY;  Service: Gastroenterology;  Laterality: N/A;   LEAD INSERTION N/A 04/28/2022   Procedure: LEAD INSERTION;  Surgeon: Duke Salvia, MD;  Location: [facility] INVASIVE CV LAB;  Service: Cardiovascular;  Laterality: N/A;   PILONIDAL CYST EXCISION     POSTERIOR LAMINECTOMY / DECOMPRESSION LUMBAR SPINE     PPM GENERATOR CHANGEOUT N/A 04/28/2022   Procedure: PPM GENERATOR CHANGEOUT;  Surgeon: Duke Salvia, MD;  Location: [facility] INVASIVE CV LAB;  Service: Cardiovascular;  Laterality: N/A;   TONSILLECTOMY      Family History  Problem Relation Age of Onset   Stroke Mother    Other Father        Deceased, car fell on him   Diabetes Sister    Hypertension Sister    Heart disease Maternal Grandmother        Pacemaker, MI    Social History   Occupational History   Occupation: Retired  Tobacco Use   Smoking status: Former    Packs/day: 1.00    Years: 44.00  Additional pack years: 0.00    Total pack years: 44.00    Types: Cigarettes    Start date: 06/05/1968    Quit date: 04/05/2013    Years since quitting: 9.5    Passive exposure: Never   Smokeless tobacco: Never  Vaping Use   Vaping Use: Never used  Substance and Sexual Activity   Alcohol use: Yes    Alcohol/week: 0.0 standard drinks of alcohol    Comment: rare   Drug use: No   Sexual activity: Not Currently     Physical Exam: Blood pressure 134/80, pulse 76, temperature (!) 97.5 F (36.4 C), temperature source Oral, height 5\' 10"  (1.778 m), weight 236 lb (107 kg), SpO2 97 %.  Gen:      No acute distress ENT:  no nasal polyps, mucus membranes moist Lungs:    No increased  respiratory effort, symmetric chest wall excursion, clear to auscultation bilaterally, no wheezes or crackles CV:         Regular rate and rhythm; no murmurs, rubs, or gallops. Trace pedal edema L>R   Data Reviewed: Imaging: I have personally reviewed the chest xray Jan 2024 chronic bilateral interstitial changes.   PFTs:     Latest Ref Rng & Units 06/04/2017    9:58 AM 08/28/2014   10:30 AM  PFT Results  FVC-Pre L 3.08  3.09   FVC-Predicted Pre % 67  66   FVC-Post L 3.10  3.08   FVC-Predicted Post % 67  65   Pre FEV1/FVC % % 65  72   Post FEV1/FCV % % 70  78   FEV1-Pre L 1.99  2.23   FEV1-Predicted Pre % 58  63   FEV1-Post L 2.17  2.39   DLCO uncorrected ml/min/mmHg 15.62  19.84   DLCO UNC% % 48  61   DLVA Predicted % 69  79   TLC L 6.08    TLC % Predicted % 86    RV % Predicted % 118     I have personally reviewed the patient's PFTs and moderately severe airflow limitation  Labs:  Immunization status: Immunization History  Administered Date(s) Administered   COVID-19, mRNA, vaccine(Comirnaty)12 years and older 03/02/2022   DTaP 01/26/2016   Fluad Quad(high Dose 65+) 03/07/2021, 03/02/2022   Influenza Split 01/21/2015   Influenza, High Dose Seasonal PF 02/23/2017, 03/22/2018, 01/10/2019, 03/01/2020   Influenza,inj,Quad PF,6+ Mos 01/21/2015, 01/27/2016, 02/05/2017   Influenza-Unspecified 01/26/2016   PFIZER Comirnaty(Gray Top)Covid-19 Tri-Sucrose Vaccine 09/10/2020   PFIZER(Purple Top)SARS-COV-2 Vaccination 06/21/2019, 07/13/2019, 04/16/2020, 09/10/2020   Pfizer Covid-19 Vaccine Bivalent Booster 26yrs & up 03/07/2021   Pneumococcal Conjugate-13 01/27/2016, 01/09/2018   Pneumococcal Polysaccharide-23 01/21/2015   Pneumococcal-Unspecified 01/26/2016   Tdap 01/26/2011, 01/27/2016, 08/09/2019   Zoster, Live 08/29/2019    External Records Personally Reviewed: pulmonary, cardiology  Assessment:  Acute CHF exacerbation COPD Dysphagia  Plan/Recommendations: I have  ordered a barium swallow test to see where the food is getting "stuck" in your throat  Take the pulmicort nebulizer treatments up to 2 times a day for shortness of breath for COPD.  You can take the duoneb treatments up to 4 times a day as needed for shortness of breath.   Your current shortness of breath is related to fluid build up from heart failure. Take lasix twice a day (one 40 mg pill morning, one 40 mg pill afternoon)  this should help the swelling in your leg and the weight gain.   Scheduled for LDCT for lung cancer screening  August 15 of this year.     Return to Care: Return in about 3 months (around 01/16/2023).   Durel Salts, MD Pulmonary and Critical Care Medicine [facility] [phone]

## 2022-10-17 ENCOUNTER — Encounter: Payer: Self-pay | Admitting: Student

## 2022-10-17 ENCOUNTER — Ambulatory Visit: Payer: PPO | Attending: Student | Admitting: Student

## 2022-10-17 VITALS — BP 108/58 | HR 82 | Ht 69.5 in | Wt 232.0 lb

## 2022-10-17 DIAGNOSIS — I428 Other cardiomyopathies: Secondary | ICD-10-CM | POA: Diagnosis not present

## 2022-10-17 DIAGNOSIS — Z95810 Presence of automatic (implantable) cardiac defibrillator: Secondary | ICD-10-CM

## 2022-10-17 DIAGNOSIS — I5042 Chronic combined systolic (congestive) and diastolic (congestive) heart failure: Secondary | ICD-10-CM

## 2022-10-17 NOTE — Patient Instructions (Signed)
Medication Instructions:  Your physician recommends that you continue on your current medications as directed. Please refer to the Current Medication list given to you today.  *If you need a refill on your cardiac medications before your next appointment, please call your pharmacy*  Lab Work: BMET-TODAY If you have labs (blood work) drawn today and your tests are completely normal, you will receive your results only by: MyChart Message (if you have MyChart) OR A paper copy in the mail If you have any lab test that is abnormal or we need to change your treatment, we will call you to review the results.  Follow-Up: At [facility], you and your health needs are our priority.  As part of our continuing mission to provide you with exceptional heart care, we have created designated Provider Care Teams.  These Care Teams include your primary Cardiologist (physician) and Advanced Practice Providers (APPs -  Physician Assistants and Nurse Practitioners) who all work together to provide you with the care you need, when you need it.  Your next appointment:   6 month(s)  Provider:   Sherryl Manges, MD

## 2022-10-18 LAB — BASIC METABOLIC PANEL
BUN/Creatinine Ratio: 18 (ref 10–24)
BUN: 22 mg/dL (ref 8–27)
CO2: 22 mmol/L (ref 20–29)
Calcium: 8.9 mg/dL (ref 8.6–10.2)
Chloride: 106 mmol/L (ref 96–106)
Creatinine, Ser: 1.19 mg/dL (ref 0.76–1.27)
Glucose: 136 mg/dL — ABNORMAL HIGH (ref 70–99)
Potassium: 5.1 mmol/L (ref 3.5–5.2)
Sodium: 141 mmol/L (ref 134–144)
eGFR: 65 mL/min/{1.73_m2} (ref >59)

## 2022-10-23 ENCOUNTER — Ambulatory Visit: Payer: PPO | Attending: Internal Medicine

## 2022-10-23 ENCOUNTER — Telehealth: Payer: Self-pay | Admitting: Internal Medicine

## 2022-10-23 DIAGNOSIS — I5042 Chronic combined systolic (congestive) and diastolic (congestive) heart failure: Secondary | ICD-10-CM

## 2022-10-23 DIAGNOSIS — Z95810 Presence of automatic (implantable) cardiac defibrillator: Secondary | ICD-10-CM | POA: Diagnosis not present

## 2022-10-23 NOTE — Telephone Encounter (Signed)
Pt called requesting refill for folic acid to [facility] in [place].

## 2022-10-24 ENCOUNTER — Ambulatory Visit (HOSPITAL_COMMUNITY)
Admission: RE | Admit: 2022-10-24 | Discharge: 2022-10-24 | Disposition: A | Discharge status: Home / Self Care | Payer: PPO | Source: Ambulatory Visit | Attending: Internal Medicine | Admitting: Internal Medicine

## 2022-10-24 ENCOUNTER — Other Ambulatory Visit: Payer: Self-pay | Admitting: Internal Medicine

## 2022-10-24 DIAGNOSIS — R1310 Dysphagia, unspecified: Secondary | ICD-10-CM | POA: Insufficient documentation

## 2022-10-24 DIAGNOSIS — M1A09X0 Idiopathic chronic gout, multiple sites, without tophus (tophi): Secondary | ICD-10-CM

## 2022-10-24 NOTE — Telephone Encounter (Signed)
Spoke with Elease Hashimoto who is on [place]. Advised that Dr. Dimple Casey has not prescribed Folic Acid. She states that the prescription was given by his previous rheumatologist. I asked if patient had previously been on MTX. She did not recall. Advised that we typically only prescribe Folic Acid if he is on MTX.

## 2022-10-24 NOTE — Telephone Encounter (Signed)
Last Fill: 07/26/2022  Labs: 10/17/2022 Glucose 136 10/09/2022 Uric Acid 4.9  Next Visit: 11/28/2022  Last Visit: 08/28/2022  DX:  Chronic gout of multiple sites, unspecified cause   Current Dose per office note 08/28/2022: allopurinol 200 mg daily   Okay to refill Allopurinol?

## 2022-10-24 NOTE — Progress Notes (Signed)
EPIC Encounter for ICM Monitoring  Patient Name: William Villanueva is a 72 y.o. male Date: 10/24/2022 Primary Care Physican: Tally Joe, MD Primary Cardiologist: Jens Som Electrophysiologist: Joycelyn Schmid Pacing: 89%            07/17/2022 Weight:  217 lbs 09/20/2022 Weight: 227 lbs 10/24/2022 Weight: 228 lbs    AT/AF Burden 0%                                                            Spoke with patient and heart failure questions reviewed.  Transmission results reviewed.  Pt reports weight gain during decreased impedance and took extra Furosemide.                   CorVue thoracic daily impedance normal but was suggesting possible fluid accumulation from 5/26-6/10.   Prescribed:   Furosemide 40 mg Take 1 tablet(2) (40 mg total) by mouth daily.  10/24/2022 Unsure if he is taking once or twice a day Spironolactone 25 mg take 0.5 tablet (12.5 mg total) once a day   Labs: 10/17/2022 Creatinine 1.19, BUN 22, Potassium 5.1, Sodium 141, GFR 65 08/11/2022 Creatinine 1.25, BUN 26, Potassium 4.4, Sodium 142, GFR >60 06/27/2022 Creatinine 0.94, BUN 21, Potassium 4.6, Sodium 140, GFR 87 06/03/2022 Creatinine 1.17, BUN 24, Potassium 3.8, Sodium 131, GFR >60 05/25/2022 Creatinine 1.11, BUN 22, Potassium 4.1, Sodium 138 A complete set of results can be found in Results Review.   Recommendations:  No changes and encouraged to call if experiencing any fluid symptoms.   Follow-up plan: ICM clinic phone appointment on 11/27/2022.   91 day device clinic remote transmission 10/31/2022.     EP/Cardiology Office Visits:   Recall 02/14/2023 with Dr Jens Som.  04/17/2023 with Dr Graciela Husbands.   Copy of ICM check sent to Dr. Graciela Husbands.    3 month ICM trend: 11/02/2022.    12-14 Month ICM trend:     Karie Soda, RN 10/24/2022 4:45 PM

## 2022-10-31 ENCOUNTER — Ambulatory Visit (INDEPENDENT_AMBULATORY_CARE_PROVIDER_SITE_OTHER): Payer: PPO

## 2022-10-31 DIAGNOSIS — I441 Atrioventricular block, second degree: Secondary | ICD-10-CM

## 2022-10-31 LAB — CUP PACEART REMOTE DEVICE CHECK
Battery Remaining Longevity: 74 mo
Battery Remaining Percentage: 88 %
Battery Voltage: 3.08 V
Brady Statistic AP VP Percent: 2.4 %
Brady Statistic AP VS Percent: 1 %
Brady Statistic AS VP Percent: 86 %
Brady Statistic AS VS Percent: 8.4 %
Brady Statistic RA Percent Paced: 1 %
Date Time Interrogation Session: 20240625020017
HighPow Impedance: 72 Ohm
HighPow Impedance: 72 Ohm
Implantable Lead Connection Status: 753985
Implantable Lead Connection Status: 753985
Implantable Lead Connection Status: 753985
Implantable Lead Implant Date: 20151223
Implantable Lead Implant Date: 20151223
Implantable Lead Implant Date: 20231222
Implantable Lead Location: 753858
Implantable Lead Location: 753859
Implantable Lead Location: 753860
Implantable Lead Model: 7122
Implantable Pulse Generator Implant Date: 20231222
Lead Channel Impedance Value: 410 Ohm
Lead Channel Impedance Value: 480 Ohm
Lead Channel Impedance Value: 600 Ohm
Lead Channel Pacing Threshold Amplitude: 0.5 V
Lead Channel Pacing Threshold Amplitude: 1 V
Lead Channel Pacing Threshold Amplitude: 1.5 V
Lead Channel Pacing Threshold Pulse Width: 0.5 ms
Lead Channel Pacing Threshold Pulse Width: 0.5 ms
Lead Channel Pacing Threshold Pulse Width: 0.5 ms
Lead Channel Sensing Intrinsic Amplitude: 12 mV
Lead Channel Sensing Intrinsic Amplitude: 2.4 mV
Lead Channel Setting Pacing Amplitude: 2 V
Lead Channel Setting Pacing Amplitude: 2 V
Lead Channel Setting Pacing Amplitude: 2.5 V
Lead Channel Setting Pacing Pulse Width: 0.5 ms
Lead Channel Setting Pacing Pulse Width: 0.5 ms
Lead Channel Setting Sensing Sensitivity: 0.5 mV
Pulse Gen Serial Number: 5555736

## 2022-11-10 ENCOUNTER — Inpatient Hospital Stay: Payer: PPO | Attending: Hematology & Oncology

## 2022-11-10 ENCOUNTER — Encounter: Payer: Self-pay | Admitting: Hematology & Oncology

## 2022-11-10 ENCOUNTER — Other Ambulatory Visit: Payer: Self-pay

## 2022-11-10 ENCOUNTER — Inpatient Hospital Stay: Payer: PPO | Admitting: Hematology & Oncology

## 2022-11-10 VITALS — BP 126/93 | HR 90 | Temp 97.8°F | Resp 19 | Ht 70.0 in | Wt 234.0 lb

## 2022-11-10 DIAGNOSIS — E119 Type 2 diabetes mellitus without complications: Secondary | ICD-10-CM | POA: Insufficient documentation

## 2022-11-10 DIAGNOSIS — I441 Atrioventricular block, second degree: Secondary | ICD-10-CM | POA: Insufficient documentation

## 2022-11-10 DIAGNOSIS — Z7982 Long term (current) use of aspirin: Secondary | ICD-10-CM | POA: Insufficient documentation

## 2022-11-10 DIAGNOSIS — D45 Polycythemia vera: Secondary | ICD-10-CM | POA: Diagnosis not present

## 2022-11-10 LAB — CBC WITH DIFFERENTIAL (CANCER CENTER ONLY)
Abs Immature Granulocytes: 0.09 10*3/uL — ABNORMAL HIGH (ref 0.00–0.07)
Basophils Absolute: 0.2 10*3/uL — ABNORMAL HIGH (ref 0.0–0.1)
Basophils Relative: 2 %
Eosinophils Absolute: 0.1 10*3/uL (ref 0.0–0.5)
Eosinophils Relative: 1 %
HCT: 37.9 % — ABNORMAL LOW (ref 39.0–52.0)
Hemoglobin: 11.5 g/dL — ABNORMAL LOW (ref 13.0–17.0)
Immature Granulocytes: 1 %
Lymphocytes Relative: 11 %
Lymphs Abs: 0.9 10*3/uL (ref 0.7–4.0)
MCH: 30.7 pg (ref 26.0–34.0)
MCHC: 30.3 g/dL (ref 30.0–36.0)
MCV: 101.3 fL — ABNORMAL HIGH (ref 80.0–100.0)
Monocytes Absolute: 0.6 10*3/uL (ref 0.1–1.0)
Monocytes Relative: 7 %
Neutro Abs: 6.3 10*3/uL (ref 1.7–7.7)
Neutrophils Relative %: 78 %
Platelet Count: 494 10*3/uL — ABNORMAL HIGH (ref 150–400)
RBC: 3.74 MIL/uL — ABNORMAL LOW (ref 4.22–5.81)
RDW: 16.1 % — ABNORMAL HIGH (ref 11.5–15.5)
WBC Count: 8.2 10*3/uL (ref 4.0–10.5)
nRBC: 0.2 % (ref 0.0–0.2)

## 2022-11-10 LAB — CMP (CANCER CENTER ONLY)
ALT: 8 U/L (ref 0–44)
AST: 13 U/L — ABNORMAL LOW (ref 15–41)
Albumin: 4 g/dL (ref 3.5–5.0)
Alkaline Phosphatase: 61 U/L (ref 38–126)
Anion gap: 10 (ref 5–15)
BUN: 24 mg/dL — ABNORMAL HIGH (ref 8–23)
CO2: 26 mmol/L (ref 22–32)
Calcium: 9 mg/dL (ref 8.9–10.3)
Chloride: 105 mmol/L (ref 98–111)
Creatinine: 1.26 mg/dL — ABNORMAL HIGH (ref 0.61–1.24)
GFR, Estimated: >60 mL/min (ref >60)
Glucose, Bld: 126 mg/dL — ABNORMAL HIGH (ref 70–99)
Potassium: 4.3 mmol/L (ref 3.5–5.1)
Sodium: 141 mmol/L (ref 135–145)
Total Bilirubin: 0.7 mg/dL (ref 0.3–1.2)
Total Protein: 6.9 g/dL (ref 6.5–8.1)

## 2022-11-10 LAB — LACTATE DEHYDROGENASE: LDH: 280 U/L — ABNORMAL HIGH (ref 98–192)

## 2022-11-10 LAB — FERRITIN: Ferritin: 15 ng/mL — ABNORMAL LOW (ref 24–336)

## 2022-11-10 LAB — IRON AND IRON BINDING CAPACITY (CC-WL,HP ONLY)
Iron: 26 ug/dL — ABNORMAL LOW (ref 45–182)
Saturation Ratios: 6 % — ABNORMAL LOW (ref 17.9–39.5)
TIBC: 430 ug/dL (ref 250–450)
UIBC: 404 ug/dL — ABNORMAL HIGH (ref 117–376)

## 2022-11-10 NOTE — Progress Notes (Signed)
Hematology and Oncology Follow Up Visit  William Villanueva [id number] 1950/12/02 72 y.o. 11/10/2022   Principle Diagnosis:  Polycythemia vera-JAK2 positive Heart block-Mobitz II  Current Therapy:   Hydrea 500 mg p.o.BID -dose changed on 08/12/2021 Aspirin 81 mg by mouth daily Phlebotomy to maintain hematocrit below 45%     Interim History:  [name] is back for followup.  He has been following a little bit more.  Shows white has been falling.  There is no syncope.  He does have the pacemaker in.  I would have to imagine that the pacemaker is working okay.  He is having some pain in the left foot.  This sounds like this might be neuropathy.  I will know if might be coming from his back.  I probably would have him go see his spine doctor again.  I will know that my be a possibility.  He might need to have some nerve conduction studies done.  He does have diabetes.  I would not think this would be diabetic neuropathy given that this is only 1 foot.  He has gained a little bit of weight.  He is only up 4 pounds since we last saw him.  He has had a lot of bruising on the arms.  He is not taking aspirin.  He really should be taking baby aspirin.  He is on the Hydrea.  I think he is doing well on the Hydrea dose.  He has had no fever.  There is been no obvious change in bowel or bladder habits.  Overall, would say that his performance status is probably ECOG 1.    Medications:  Current Outpatient Medications:    acetaminophen (TYLENOL) 500 MG tablet, Take 1,000 mg by mouth every 6 (six) hours as needed for headache., Disp: , Rfl:    allopurinol (ZYLOPRIM) 300 MG tablet, TAKE 1 TABLET BY MOUTH DAILY, Disp: 90 tablet, Rfl: 0   budesonide (PULMICORT) 0.5 MG/2ML nebulizer solution, Take 2 mLs (0.5 mg total) by nebulization 2 (two) times daily. (RINSE MOUTH AFTER USE), Disp: 360 mL, Rfl: 11   carvedilol (COREG) 3.125 MG tablet, Take 1 tablet (3.125 mg total) by mouth 2 (two) times daily  with a meal., Disp: 200 tablet, Rfl: 2   cholestyramine (QUESTRAN) 4 GM/DOSE powder, Take 1 g by mouth 2 (two) times daily as needed (diarrhea)., Disp: , Rfl:    citalopram (CELEXA) 10 MG tablet, Take 5 mg by mouth in the morning., Disp: , Rfl:    colchicine 0.6 MG tablet, Take 1 tablet by mouth daily as needed for joint pain and swelling, Disp: 30 tablet, Rfl: 1   Cyanocobalamin (B-12 PO), Take 1 tablet by mouth daily. (Patient not taking: Reported on 10/17/2022), Disp: , Rfl:    diclofenac Sodium (VOLTAREN) 1 % GEL, Apply 2 g topically 4 (four) times daily. Apply to left shoulder for pain. (Patient taking differently: Apply 2 g topically daily as needed (Apply to left shoulder for pain.).), Disp: 200 g, Rfl: 0   dicyclomine (BENTYL) 20 MG tablet, Take 20 mg by mouth 2 (two) times daily., Disp: , Rfl:    folic acid (FOLVITE) 1 MG tablet, Take 1 mg by mouth in the morning., Disp: , Rfl:    furosemide (LASIX) 40 MG tablet, Take 2 tablets (80 mg total) by mouth daily. (Patient taking differently: Take 40-80 mg by mouth in the morning.), Disp: 30 tablet, Rfl: 0   hydroxyurea (HYDREA) 500 MG capsule, TAKE TWO CAPSULES BY  MOUTH DAILY, Disp: 160 capsule, Rfl: 4   insulin degludec (TRESIBA FLEXTOUCH) 100 UNIT/ML SOPN FlexTouch Pen, Inject 20 Units into the skin daily after breakfast. , Disp: , Rfl:    ipratropium-albuterol (DUONEB) 0.5-2.5 (3) MG/3ML SOLN, Inhale one vial in nebulizer twice daily and in between if needed, Disp: , Rfl:    ketoconazole (NIZORAL) 2 % cream, Apply 1 Application topically daily., Disp: , Rfl:    Magnesium Oxide 400 MG CAPS, Take 2 capsules (800 mg total) by mouth in the morning and at bedtime. (Patient taking differently: Take 400 mg by mouth in the morning and at bedtime.), Disp: 90 capsule, Rfl: 1   omeprazole (PRILOSEC) 20 MG capsule, Take 20 mg by mouth in the morning and at bedtime., Disp: , Rfl:    oxybutynin (DITROPAN XL) 15 MG 24 hr tablet, Take 15 mg by mouth in the  morning., Disp: , Rfl:    prednisoLONE acetate (PRED FORTE) 1 % ophthalmic suspension, Place 1 drop into both eyes daily as needed (eye irritation.)., Disp: , Rfl:    rosuvastatin (CRESTOR) 5 MG tablet, Take 5 mg by mouth in the morning., Disp: , Rfl:    spironolactone (ALDACTONE) 25 MG tablet, Take 0.5 tablets (12.5 mg total) by mouth daily., Disp: 45 tablet, Rfl: 3   tamsulosin (FLOMAX) 0.4 MG CAPS capsule, Take 0.4 mg by mouth 2 (two) times daily. , Disp: , Rfl:    triamcinolone cream (KENALOG) 0.1 %, Apply 1 Application topically 2 (two) times daily., Disp: 30 g, Rfl: 0   Turmeric (QC TUMERIC COMPLEX) 500 MG CAPS, Take 1 tablet by mouth daily., Disp: , Rfl:    zolpidem (AMBIEN) 5 MG tablet, Take 1 tablet (5 mg total) by mouth at bedtime as needed for sleep. (Patient not taking: Reported on 08/28/2022), Disp: 15 tablet, Rfl: 0  Allergies:  Allergies  Allergen Reactions   Advil [Ibuprofen] Itching   Keflex [Cephalexin] Diarrhea and Other (See Comments)    Caused C-diff, also    Wellbutrin [Bupropion] Hives and Other (See Comments)   Prednisone Itching and Other (See Comments)    Abdominal pain, also    Temazepam Other (See Comments)    Dizziness    Trazodone And Nefazodone Other (See Comments)    Dizziness   Sacubitril-Valsartan Other (See Comments)   Prozac [Fluoxetine Hcl] Itching    Past Medical History, Surgical history, Social history, and Family History were reviewed and updated.  Review of Systems: Review of Systems  Constitutional: Negative.   HENT: Negative.    Eyes: Negative.   Respiratory:  Positive for shortness of breath.   Cardiovascular:  Positive for chest pain.  Gastrointestinal: Negative.   Genitourinary: Negative.   Musculoskeletal:  Positive for back pain.  Skin: Negative.   Neurological: Negative.   Endo/Heme/Allergies: Negative.   Psychiatric/Behavioral: Negative.      Physical Exam: Vital signs temperature 97.8.  Pulse 90.  Blood pressure 126/93.   Weight is 234 pounds.  Physical Exam Vitals reviewed.  HENT:     Head: Normocephalic and atraumatic.  Eyes:     Pupils: Pupils are equal, round, and reactive to light.  Cardiovascular:     Rate and Rhythm: Normal rate and regular rhythm.     Heart sounds: Normal heart sounds.  Pulmonary:     Effort: Pulmonary effort is normal.     Breath sounds: Normal breath sounds.  Abdominal:     General: Bowel sounds are normal.     Palpations: Abdomen is  soft.  Musculoskeletal:        General: No tenderness or deformity. Normal range of motion.     Cervical back: Normal range of motion.  Lymphadenopathy:     Cervical: No cervical adenopathy.  Skin:    General: Skin is warm and dry.     Findings: No erythema or rash.  Neurological:     Mental Status: He is alert and oriented to person, place, and time.  Psychiatric:        Behavior: Behavior normal.        Thought Content: Thought content normal.        Judgment: Judgment normal.      Lab Results  Component Value Date   WBC 8.2 11/10/2022   HGB 11.5 (L) 11/10/2022   HCT 37.9 (L) 11/10/2022   MCV 101.3 (H) 11/10/2022   PLT 494 (H) 11/10/2022     Chemistry      Component Value Date/Time   NA 141 10/17/2022 1021   NA 141 04/10/2017 0923   NA 140 04/05/2016 0739   K 5.1 10/17/2022 1021   K 3.5 04/10/2017 0923   K 4.2 04/05/2016 0739   CL 106 10/17/2022 1021   CL 108 04/10/2017 0923   CO2 22 10/17/2022 1021   CO2 24 04/10/2017 0923   CO2 20 (L) 04/05/2016 0739   BUN 22 10/17/2022 1021   BUN 12 04/10/2017 0923   BUN 14.7 04/05/2016 0739   CREATININE 1.19 10/17/2022 1021   CREATININE 1.25 (H) 08/11/2022 0811   CREATININE 1.1 04/10/2017 0923   CREATININE 1.1 04/05/2016 0739      Component Value Date/Time   CALCIUM 8.9 10/17/2022 1021   CALCIUM 8.4 04/10/2017 0923   CALCIUM 9.0 04/05/2016 0739   ALKPHOS 63 08/11/2022 0811   ALKPHOS 77 04/10/2017 0923   ALKPHOS 91 04/05/2016 0739   AST 13 (L) 08/11/2022 0811   AST  34 04/05/2016 0739   ALT 8 08/11/2022 0811   ALT 32 04/10/2017 0923   ALT 40 04/05/2016 0739   BILITOT 0.7 08/11/2022 0811   BILITOT 0.48 04/05/2016 0739      Impression and Plan: William Villanueva is 72 year old gentleman with polycythemia vera.  Everything is doing okay from my point of view.  We have not had to phlebotomize him  for good 5 years.    I think his issues clearly not can be hematologic.  He does have the diabetes.  He does have the pacemaker in.  Again, he has had back surgery.  I do not know if he needs to see his spine doctor to see if he might be able to help with the left foot.  Again, he does not need to be phlebotomized.  We will still plan to get him back in 3 months.    Josph Macho, MD 7/5/20248:09 AM

## 2022-11-13 ENCOUNTER — Other Ambulatory Visit: Payer: Self-pay | Admitting: *Deleted

## 2022-11-13 DIAGNOSIS — I10 Essential (primary) hypertension: Secondary | ICD-10-CM

## 2022-11-13 DIAGNOSIS — J441 Chronic obstructive pulmonary disease with (acute) exacerbation: Secondary | ICD-10-CM

## 2022-11-13 DIAGNOSIS — R0602 Shortness of breath: Secondary | ICD-10-CM

## 2022-11-13 DIAGNOSIS — D45 Polycythemia vera: Secondary | ICD-10-CM

## 2022-11-22 NOTE — Progress Notes (Signed)
 Remote ICD transmission.   

## 2022-11-27 ENCOUNTER — Ambulatory Visit (HOSPITAL_COMMUNITY)
Admission: RE | Admit: 2022-11-27 | Discharge: 2022-11-27 | Disposition: A | Discharge status: Home / Self Care | Payer: PPO | Source: Ambulatory Visit | Attending: Hematology & Oncology | Admitting: Hematology & Oncology

## 2022-11-27 ENCOUNTER — Encounter: Payer: Self-pay | Admitting: *Deleted

## 2022-11-27 ENCOUNTER — Ambulatory Visit (INDEPENDENT_AMBULATORY_CARE_PROVIDER_SITE_OTHER): Payer: PPO

## 2022-11-27 ENCOUNTER — Ambulatory Visit (INDEPENDENT_AMBULATORY_CARE_PROVIDER_SITE_OTHER)
Admission: RE | Admit: 2022-11-27 | Discharge: 2022-11-27 | Disposition: A | Discharge status: Home / Self Care | Payer: PPO | Source: Ambulatory Visit | Attending: Hematology & Oncology | Admitting: Hematology & Oncology

## 2022-11-27 DIAGNOSIS — I10 Essential (primary) hypertension: Secondary | ICD-10-CM

## 2022-11-27 DIAGNOSIS — I5042 Chronic combined systolic (congestive) and diastolic (congestive) heart failure: Secondary | ICD-10-CM | POA: Diagnosis not present

## 2022-11-27 DIAGNOSIS — R0602 Shortness of breath: Secondary | ICD-10-CM | POA: Insufficient documentation

## 2022-11-27 DIAGNOSIS — J441 Chronic obstructive pulmonary disease with (acute) exacerbation: Secondary | ICD-10-CM | POA: Diagnosis not present

## 2022-11-27 DIAGNOSIS — D45 Polycythemia vera: Secondary | ICD-10-CM

## 2022-11-27 DIAGNOSIS — Z95810 Presence of automatic (implantable) cardiac defibrillator: Secondary | ICD-10-CM | POA: Diagnosis not present

## 2022-11-27 LAB — VAS US ABI WITH/WO TBI
Left ABI: 1.2
Right ABI: 1.38

## 2022-11-28 ENCOUNTER — Ambulatory Visit: Payer: PPO | Admitting: Internal Medicine

## 2022-11-29 NOTE — Progress Notes (Signed)
EPIC Encounter for ICM Monitoring  Patient Name: William Villanueva is a 72 y.o. male Date: 11/29/2022 Primary Care Physican: Tally Joe, MD Primary Cardiologist: Jens Som Electrophysiologist: Joycelyn Schmid Pacing: 89%            07/17/2022 Weight:  217 lbs 09/20/2022 Weight: 227 lbs 10/24/2022 Weight: 228 lbs    AT/AF Burden <1%                                                            Spoke with patient and heart failure questions reviewed.  Transmission results reviewed.  Pt asymptomatic for fluid accumulation.  Reports feeling well at this time and voices no complaints.   Discussed diet and limiting salt intake                 CorVue thoracic daily impedance normal but was suggesting possible fluid accumulation from 6/30-7/12.   Prescribed:   Furosemide 40 mg Take 2 tablet(2) (80 mg total) by mouth daily.   Spironolactone 25 mg take 0.5 tablet (12.5 mg total) once a day   Labs: 11/10/2022 Creatinine 1.26, BUN 24, Potassium 4.3, Sodium 141, GFR >60 10/17/2022 Creatinine 1.19, BUN 22, Potassium 5.1, Sodium 141, GFR 65 08/11/2022 Creatinine 1.25, BUN 26, Potassium 4.4, Sodium 142, GFR >60 06/27/2022 Creatinine 0.94, BUN 21, Potassium 4.6, Sodium 140, GFR 87 06/03/2022 Creatinine 1.17, BUN 24, Potassium 3.8, Sodium 131, GFR >60 05/25/2022 Creatinine 1.11, BUN 22, Potassium 4.1, Sodium 138 A complete set of results can be found in Results Review.   Recommendations:  Recommendation to limit salt intake to 2000 mg daily and fluid intake to 64 oz daily.  Encouraged to call if experiencing any fluid symptoms.    Follow-up plan: ICM clinic phone appointment on 01/01/2023.   91 day device clinic remote transmission 01/30/2023.     EP/Cardiology Office Visits:   Recall 02/14/2023 with Dr Jens Som.  04/17/2023 with Dr Graciela Husbands.   Copy of ICM check sent to Dr. Graciela Husbands.   3 month ICM trend: 11/27/2022.    12-14 Month ICM trend:     Karie Soda, RN 11/29/2022 6:56 AM

## 2022-12-05 ENCOUNTER — Other Ambulatory Visit: Payer: Self-pay | Admitting: Internal Medicine

## 2022-12-05 ENCOUNTER — Other Ambulatory Visit: Payer: Self-pay

## 2022-12-05 MED ORDER — FUROSEMIDE 40 MG PO TABS: 40.0000 mg | ORAL_TABLET | Freq: Every morning | ORAL | 3 refills | Status: DC

## 2022-12-12 DIAGNOSIS — Q6671 Congenital pes cavus, right foot: Secondary | ICD-10-CM | POA: Diagnosis not present

## 2022-12-12 DIAGNOSIS — M79672 Pain in left foot: Secondary | ICD-10-CM | POA: Diagnosis not present

## 2022-12-12 DIAGNOSIS — M7751 Other enthesopathy of right foot: Secondary | ICD-10-CM | POA: Diagnosis not present

## 2022-12-12 DIAGNOSIS — E1140 Type 2 diabetes mellitus with diabetic neuropathy, unspecified: Secondary | ICD-10-CM | POA: Diagnosis not present

## 2022-12-12 DIAGNOSIS — M79671 Pain in right foot: Secondary | ICD-10-CM | POA: Diagnosis not present

## 2022-12-12 DIAGNOSIS — M19072 Primary osteoarthritis, left ankle and foot: Secondary | ICD-10-CM | POA: Diagnosis not present

## 2022-12-12 DIAGNOSIS — Q6672 Congenital pes cavus, left foot: Secondary | ICD-10-CM | POA: Diagnosis not present

## 2022-12-12 DIAGNOSIS — M7752 Other enthesopathy of left foot: Secondary | ICD-10-CM | POA: Diagnosis not present

## 2022-12-18 ENCOUNTER — Ambulatory Visit (HOSPITAL_BASED_OUTPATIENT_CLINIC_OR_DEPARTMENT_OTHER): Payer: PPO

## 2022-12-21 ENCOUNTER — Telehealth: Payer: Self-pay | Admitting: Cardiology

## 2022-12-21 ENCOUNTER — Ambulatory Visit (HOSPITAL_BASED_OUTPATIENT_CLINIC_OR_DEPARTMENT_OTHER): Payer: PPO

## 2022-12-21 ENCOUNTER — Other Ambulatory Visit: Payer: Self-pay

## 2022-12-21 ENCOUNTER — Encounter (HOSPITAL_COMMUNITY): Payer: Self-pay | Admitting: Internal Medicine

## 2022-12-21 ENCOUNTER — Inpatient Hospital Stay (HOSPITAL_COMMUNITY)
Admission: EM | Admit: 2022-12-21 | Discharge: 2022-12-24 | DRG: 291 | Disposition: A | Discharge status: Home / Self Care | Payer: PPO | Attending: Internal Medicine | Admitting: Internal Medicine

## 2022-12-21 ENCOUNTER — Emergency Department (HOSPITAL_COMMUNITY): Payer: PPO

## 2022-12-21 DIAGNOSIS — I130 Hypertensive heart and chronic kidney disease with heart failure and stage 1 through stage 4 chronic kidney disease, or unspecified chronic kidney disease: Secondary | ICD-10-CM | POA: Diagnosis not present

## 2022-12-21 DIAGNOSIS — Z888 Allergy status to other drugs, medicaments and biological substances status: Secondary | ICD-10-CM

## 2022-12-21 DIAGNOSIS — I509 Heart failure, unspecified: Secondary | ICD-10-CM | POA: Diagnosis not present

## 2022-12-21 DIAGNOSIS — E119 Type 2 diabetes mellitus without complications: Secondary | ICD-10-CM

## 2022-12-21 DIAGNOSIS — E6609 Other obesity due to excess calories: Secondary | ICD-10-CM

## 2022-12-21 DIAGNOSIS — G4733 Obstructive sleep apnea (adult) (pediatric): Secondary | ICD-10-CM | POA: Diagnosis present

## 2022-12-21 DIAGNOSIS — I2510 Atherosclerotic heart disease of native coronary artery without angina pectoris: Secondary | ICD-10-CM | POA: Diagnosis present

## 2022-12-21 DIAGNOSIS — J9611 Chronic respiratory failure with hypoxia: Secondary | ICD-10-CM | POA: Diagnosis not present

## 2022-12-21 DIAGNOSIS — E1122 Type 2 diabetes mellitus with diabetic chronic kidney disease: Secondary | ICD-10-CM | POA: Diagnosis present

## 2022-12-21 DIAGNOSIS — E872 Acidosis, unspecified: Secondary | ICD-10-CM | POA: Diagnosis present

## 2022-12-21 DIAGNOSIS — E7800 Pure hypercholesterolemia, unspecified: Secondary | ICD-10-CM | POA: Diagnosis not present

## 2022-12-21 DIAGNOSIS — J449 Chronic obstructive pulmonary disease, unspecified: Secondary | ICD-10-CM | POA: Diagnosis present

## 2022-12-21 DIAGNOSIS — I110 Hypertensive heart disease with heart failure: Secondary | ICD-10-CM | POA: Diagnosis not present

## 2022-12-21 DIAGNOSIS — M109 Gout, unspecified: Secondary | ICD-10-CM | POA: Diagnosis not present

## 2022-12-21 DIAGNOSIS — Z950 Presence of cardiac pacemaker: Secondary | ICD-10-CM

## 2022-12-21 DIAGNOSIS — I10 Essential (primary) hypertension: Secondary | ICD-10-CM | POA: Diagnosis not present

## 2022-12-21 DIAGNOSIS — M778 Other enthesopathies, not elsewhere classified: Secondary | ICD-10-CM | POA: Diagnosis not present

## 2022-12-21 DIAGNOSIS — Z8249 Family history of ischemic heart disease and other diseases of the circulatory system: Secondary | ICD-10-CM

## 2022-12-21 DIAGNOSIS — I5042 Chronic combined systolic (congestive) and diastolic (congestive) heart failure: Secondary | ICD-10-CM | POA: Diagnosis not present

## 2022-12-21 DIAGNOSIS — K219 Gastro-esophageal reflux disease without esophagitis: Secondary | ICD-10-CM | POA: Diagnosis present

## 2022-12-21 DIAGNOSIS — J9811 Atelectasis: Secondary | ICD-10-CM | POA: Diagnosis not present

## 2022-12-21 DIAGNOSIS — R0989 Other specified symptoms and signs involving the circulatory and respiratory systems: Secondary | ICD-10-CM | POA: Diagnosis not present

## 2022-12-21 DIAGNOSIS — Z0000 Encounter for general adult medical examination without abnormal findings: Secondary | ICD-10-CM | POA: Diagnosis not present

## 2022-12-21 DIAGNOSIS — Z881 Allergy status to other antibiotic agents status: Secondary | ICD-10-CM

## 2022-12-21 DIAGNOSIS — R0602 Shortness of breath: Secondary | ICD-10-CM | POA: Diagnosis not present

## 2022-12-21 DIAGNOSIS — Z95810 Presence of automatic (implantable) cardiac defibrillator: Secondary | ICD-10-CM | POA: Diagnosis not present

## 2022-12-21 DIAGNOSIS — R7989 Other specified abnormal findings of blood chemistry: Secondary | ICD-10-CM | POA: Diagnosis not present

## 2022-12-21 DIAGNOSIS — Z886 Allergy status to analgesic agent status: Secondary | ICD-10-CM

## 2022-12-21 DIAGNOSIS — Z6835 Body mass index (BMI) 35.0-35.9, adult: Secondary | ICD-10-CM

## 2022-12-21 DIAGNOSIS — N1831 Chronic kidney disease, stage 3a: Secondary | ICD-10-CM | POA: Diagnosis not present

## 2022-12-21 DIAGNOSIS — R0789 Other chest pain: Secondary | ICD-10-CM | POA: Diagnosis not present

## 2022-12-21 DIAGNOSIS — Z7951 Long term (current) use of inhaled steroids: Secondary | ICD-10-CM

## 2022-12-21 DIAGNOSIS — K5190 Ulcerative colitis, unspecified, without complications: Secondary | ICD-10-CM | POA: Diagnosis not present

## 2022-12-21 DIAGNOSIS — I2489 Other forms of acute ischemic heart disease: Secondary | ICD-10-CM | POA: Diagnosis present

## 2022-12-21 DIAGNOSIS — Z87442 Personal history of urinary calculi: Secondary | ICD-10-CM

## 2022-12-21 DIAGNOSIS — Z87891 Personal history of nicotine dependence: Secondary | ICD-10-CM

## 2022-12-21 DIAGNOSIS — G4700 Insomnia, unspecified: Secondary | ICD-10-CM | POA: Diagnosis not present

## 2022-12-21 DIAGNOSIS — Z794 Long term (current) use of insulin: Secondary | ICD-10-CM

## 2022-12-21 DIAGNOSIS — I5023 Acute on chronic systolic (congestive) heart failure: Secondary | ICD-10-CM | POA: Diagnosis not present

## 2022-12-21 DIAGNOSIS — B359 Dermatophytosis, unspecified: Secondary | ICD-10-CM | POA: Diagnosis present

## 2022-12-21 DIAGNOSIS — E669 Obesity, unspecified: Secondary | ICD-10-CM | POA: Diagnosis present

## 2022-12-21 DIAGNOSIS — J90 Pleural effusion, not elsewhere classified: Secondary | ICD-10-CM | POA: Diagnosis not present

## 2022-12-21 DIAGNOSIS — Z1331 Encounter for screening for depression: Secondary | ICD-10-CM | POA: Diagnosis not present

## 2022-12-21 DIAGNOSIS — N400 Enlarged prostate without lower urinary tract symptoms: Secondary | ICD-10-CM | POA: Diagnosis present

## 2022-12-21 DIAGNOSIS — I5A Non-ischemic myocardial injury (non-traumatic): Secondary | ICD-10-CM | POA: Diagnosis present

## 2022-12-21 DIAGNOSIS — I428 Other cardiomyopathies: Secondary | ICD-10-CM | POA: Diagnosis present

## 2022-12-21 DIAGNOSIS — G250 Essential tremor: Secondary | ICD-10-CM | POA: Diagnosis not present

## 2022-12-21 DIAGNOSIS — I442 Atrioventricular block, complete: Secondary | ICD-10-CM | POA: Diagnosis not present

## 2022-12-21 DIAGNOSIS — Z833 Family history of diabetes mellitus: Secondary | ICD-10-CM

## 2022-12-21 DIAGNOSIS — D75839 Thrombocytosis, unspecified: Secondary | ICD-10-CM | POA: Diagnosis present

## 2022-12-21 DIAGNOSIS — D45 Polycythemia vera: Secondary | ICD-10-CM | POA: Diagnosis present

## 2022-12-21 DIAGNOSIS — Z23 Encounter for immunization: Secondary | ICD-10-CM | POA: Diagnosis not present

## 2022-12-21 DIAGNOSIS — M069 Rheumatoid arthritis, unspecified: Secondary | ICD-10-CM | POA: Diagnosis not present

## 2022-12-21 DIAGNOSIS — I272 Pulmonary hypertension, unspecified: Secondary | ICD-10-CM | POA: Diagnosis present

## 2022-12-21 DIAGNOSIS — Z79899 Other long term (current) drug therapy: Secondary | ICD-10-CM

## 2022-12-21 LAB — CBC
HCT: 34.7 % — ABNORMAL LOW (ref 39.0–52.0)
Hemoglobin: 9.8 g/dL — ABNORMAL LOW (ref 13.0–17.0)
MCH: 28.7 pg (ref 26.0–34.0)
MCHC: 28.2 g/dL — ABNORMAL LOW (ref 30.0–36.0)
MCV: 101.5 fL — ABNORMAL HIGH (ref 80.0–100.0)
Platelets: 527 10*3/uL — ABNORMAL HIGH (ref 150–400)
RBC: 3.42 MIL/uL — ABNORMAL LOW (ref 4.22–5.81)
RDW: 18.6 % — ABNORMAL HIGH (ref 11.5–15.5)
WBC: 9.4 10*3/uL (ref 4.0–10.5)
nRBC: 0.2 % (ref 0.0–0.2)

## 2022-12-21 LAB — BASIC METABOLIC PANEL
Anion gap: 15 (ref 5–15)
BUN: 24 mg/dL — ABNORMAL HIGH (ref 8–23)
CO2: 18 mmol/L — ABNORMAL LOW (ref 22–32)
Calcium: 8.5 mg/dL — ABNORMAL LOW (ref 8.9–10.3)
Chloride: 105 mmol/L (ref 98–111)
Creatinine, Ser: 1.37 mg/dL — ABNORMAL HIGH (ref 0.61–1.24)
GFR, Estimated: 55 mL/min — ABNORMAL LOW (ref >60)
Glucose, Bld: 140 mg/dL — ABNORMAL HIGH (ref 70–99)
Potassium: 3.9 mmol/L (ref 3.5–5.1)
Sodium: 138 mmol/L (ref 135–145)

## 2022-12-21 LAB — I-STAT CG4 LACTIC ACID, ED: Lactic Acid, Venous: 1.3 mmol/L (ref 0.5–1.9)

## 2022-12-21 LAB — PROTIME-INR
INR: 1.4 — ABNORMAL HIGH (ref 0.8–1.2)
Prothrombin Time: 17.8 seconds — ABNORMAL HIGH (ref 11.4–15.2)

## 2022-12-21 LAB — TROPONIN I (HIGH SENSITIVITY)
Troponin I (High Sensitivity): 62 ng/L — ABNORMAL HIGH (ref <18)
Troponin I (High Sensitivity): 69 ng/L — ABNORMAL HIGH (ref <18)

## 2022-12-21 LAB — MAGNESIUM: Magnesium: 1.8 mg/dL (ref 1.7–2.4)

## 2022-12-21 LAB — BRAIN NATRIURETIC PEPTIDE: B Natriuretic Peptide: 889.4 pg/mL — ABNORMAL HIGH (ref 0.0–100.0)

## 2022-12-21 MED ORDER — SODIUM BICARBONATE 650 MG PO TABS
650.0000 mg | ORAL_TABLET | Freq: Two times a day (BID) | ORAL | Status: DC
  Administered 2022-12-22 – 2022-12-24 (×5): 650 mg via ORAL
  Filled 2022-12-21 (×5): qty 1

## 2022-12-21 MED ORDER — FUROSEMIDE 10 MG/ML IJ SOLN
80.0000 mg | Freq: Once | INTRAMUSCULAR | Status: AC
  Administered 2022-12-21: 80 mg via INTRAVENOUS
  Filled 2022-12-21: qty 8

## 2022-12-21 MED ORDER — ACETAZOLAMIDE 250 MG PO TABS: 500.0000 mg | ORAL_TABLET | Freq: Two times a day (BID) | ORAL | Status: DC

## 2022-12-21 NOTE — Assessment & Plan Note (Signed)
Stable. Continue with pulmicort and dounebs.

## 2022-12-21 NOTE — ED Triage Notes (Signed)
Patient reports increasing SOB with tightness worse with exertion , occasional dry cough onset 2 weeks ago . His cardiologist is Dr. Jens Som.

## 2022-12-21 NOTE — Telephone Encounter (Signed)
Left message for pt wife to call  

## 2022-12-21 NOTE — Assessment & Plan Note (Signed)
Continue hydroxyurea, folic acid.

## 2022-12-21 NOTE — Assessment & Plan Note (Signed)
Continue with hydroxyurea.

## 2022-12-21 NOTE — ED Provider Notes (Signed)
[place] EMERGENCY DEPARTMENT AT [facility] Provider Note   CSN: [id number] Arrival date & time: 12/21/22  1857     History  Chief Complaint  Patient presents with   SOB / Chest Tightness    Kamran Robson is a 72 y.o. male.  He has a history of hypertension diabetes congestive heart failure pacemaker AICD.  Complaining of 1 week of increased shortness of breath dyspnea on exertion.  Intermittent chest tightness.  Has a cough productive of some clear sputum.  He has also noticed a 20 pound weight gain over a week.  Talked to his PCP who recommended he reach out to his cardiologist.  Has not heard back from them.  Continues to take his diuretics and his regular medications.  The history is provided by the patient and the spouse.  Shortness of Breath Severity:  Moderate Onset quality:  Gradual Duration:  1 week Timing:  Constant Progression:  Worsening Chronicity:  Recurrent Relieved by:  Nothing Worsened by:  Activity Ineffective treatments:  Rest and diuretics Associated symptoms: chest pain, cough and sputum production   Associated symptoms: no abdominal pain, no fever, no hemoptysis and no vomiting        Home Medications Prior to Admission medications   Medication Sig Start Date End Date Taking? Authorizing Provider  acetaminophen (TYLENOL) 500 MG tablet Take 1,000 mg by mouth every 6 (six) hours as needed for headache.    [provider]  allopurinol (ZYLOPRIM) 300 MG tablet TAKE 1 TABLET BY MOUTH DAILY 10/24/22   Rice, Jamesetta Orleans, MD  budesonide (PULMICORT) 0.5 MG/2ML nebulizer solution Take 2 mLs (0.5 mg total) by nebulization 2 (two) times daily. (RINSE MOUTH AFTER USE) 09/28/22   Nyoka Cowden, MD  carvedilol (COREG) 3.125 MG tablet Take 1 tablet (3.125 mg total) by mouth 2 (two) times daily with a meal. 05/02/22   Duke Salvia, MD  cholestyramine Lanetta Inch) 4 GM/DOSE powder Take 1 g by mouth 2 (two) times daily as needed (diarrhea).  03/22/22   [provider]  citalopram (CELEXA) 10 MG tablet Take 5 mg by mouth in the morning. 03/01/22   [provider]  colchicine 0.6 MG tablet Take 1 tablet by mouth daily as needed for joint pain and swelling 09/06/22   Rice, Jamesetta Orleans, MD  Cyanocobalamin (B-12 PO) Take 1 tablet by mouth daily. Patient not taking: Reported on 10/17/2022    [provider]  diclofenac Sodium (VOLTAREN) 1 % GEL Apply 2 g topically 4 (four) times daily. Apply to left shoulder for pain. Patient taking differently: Apply 2 g topically daily as needed (Apply to left shoulder for pain.). 02/14/22   Rana Snare, DO  dicyclomine (BENTYL) 20 MG tablet Take 20 mg by mouth 2 (two) times daily. 03/28/22   [provider]  folic acid (FOLVITE) 1 MG tablet Take 1 mg by mouth in the morning.    [provider]  furosemide (LASIX) 40 MG tablet Take 1-2 tablets (40-80 mg total) by mouth in the morning. 12/05/22   Rondel Baton, MD  hydroxyurea (HYDREA) 500 MG capsule TAKE TWO CAPSULES BY MOUTH DAILY 07/21/22   Josph Macho, MD  insulin degludec (TRESIBA FLEXTOUCH) 100 UNIT/ML SOPN FlexTouch Pen Inject 20 Units into the skin daily after breakfast.     [provider]  ipratropium-albuterol (DUONEB) 0.5-2.5 (3) MG/3ML SOLN Inhale one vial in nebulizer twice daily and in between if needed 05/18/22   Nyoka Cowden, MD  ketoconazole (NIZORAL) 2 % cream Apply 1 Application topically daily. 12/22/21   [provider]  Magnesium Oxide 400 MG CAPS Take 2 capsules (800 mg total) by mouth in the morning and at bedtime. Patient taking differently: Take 400 mg by mouth in the morning and at bedtime. 04/26/20   Duke Salvia, MD  omeprazole (PRILOSEC) 20 MG capsule Take 20 mg by mouth in the morning and at bedtime.    [provider]  oxybutynin (DITROPAN XL) 15 MG 24 hr tablet Take 15 mg by mouth in the morning. 02/06/22   [provider]   prednisoLONE acetate (PRED FORTE) 1 % ophthalmic suspension Place 1 drop into both eyes daily as needed (eye irritation.).    [provider]  rosuvastatin (CRESTOR) 5 MG tablet Take 5 mg by mouth in the morning.    [provider]  spironolactone (ALDACTONE) 25 MG tablet Take 0.5 tablets (12.5 mg total) by mouth daily. 04/18/22   Lewayne Bunting, MD  tamsulosin (FLOMAX) 0.4 MG CAPS capsule Take 0.4 mg by mouth 2 (two) times daily.  08/03/17   [provider]  triamcinolone cream (KENALOG) 0.1 % Apply 1 Application topically 2 (two) times daily. 10/09/22   Louann Sjogren, DPM  Turmeric (QC TUMERIC COMPLEX) 500 MG CAPS Take 1 tablet by mouth daily.    [provider]  zolpidem (AMBIEN) 5 MG tablet Take 1 tablet (5 mg total) by mouth at bedtime as needed for sleep. Patient not taking: Reported on 08/28/2022 02/10/22   Arrien, York Ram, MD      Allergies    Advil [ibuprofen], Keflex [cephalexin], Wellbutrin [bupropion], Prednisone, Temazepam, Trazodone and nefazodone, Sacubitril-valsartan, and Prozac [fluoxetine hcl]    Review of Systems   Review of Systems  Constitutional:  Negative for fever.  Respiratory:  Positive for cough, sputum production and shortness of breath. Negative for hemoptysis.   Cardiovascular:  Positive for chest pain.  Gastrointestinal:  Positive for diarrhea. Negative for abdominal pain and vomiting.    Physical Exam Updated Vital Signs BP 108/65 (BP Location: Left Arm)   Pulse (!) 106   Temp 97.9 F (36.6 C) (Oral)   Resp 19   SpO2 94%  Physical Exam Vitals and nursing note reviewed.  Constitutional:      General: He is not in acute distress.    Appearance: Normal appearance. He is well-developed.  HENT:     Head: Normocephalic and atraumatic.  Eyes:     Conjunctiva/sclera: Conjunctivae normal.  Cardiovascular:     Rate and Rhythm: Regular rhythm. Tachycardia present.     Heart sounds: No murmur heard. Pulmonary:      Effort: Pulmonary effort is normal. No respiratory distress.     Breath sounds: Normal breath sounds.  Abdominal:     Palpations: Abdomen is soft.     Tenderness: There is no abdominal tenderness. There is no guarding or rebound.  Musculoskeletal:        General: Normal range of motion.     Cervical back: Neck supple.     Right lower leg: Edema present.     Left lower leg: Edema present.  Skin:    General: Skin is warm and dry.     Capillary Refill: Capillary refill takes less than 2 seconds.  Neurological:     General: No focal deficit present.     Mental Status: He is alert.     ED Results / Procedures / Treatments   Labs (all labs  ordered are listed, but only abnormal results are displayed) Labs Reviewed  BASIC METABOLIC PANEL - Abnormal; Notable for the following components:      Result Value   CO2 18 (*)    Glucose, Bld 140 (*)    BUN 24 (*)    Creatinine, Ser 1.37 (*)    Calcium 8.5 (*)    GFR, Estimated 55 (*)    All other components within normal limits  CBC - Abnormal; Notable for the following components:   RBC 3.42 (*)    Hemoglobin 9.8 (*)    HCT 34.7 (*)    MCV 101.5 (*)    MCHC 28.2 (*)    RDW 18.6 (*)    Platelets 527 (*)    All other components within normal limits  PROTIME-INR - Abnormal; Notable for the following components:   Prothrombin Time 17.8 (*)    INR 1.4 (*)    All other components within normal limits  BRAIN NATRIURETIC PEPTIDE - Abnormal; Notable for the following components:   B Natriuretic Peptide 889.4 (*)    All other components within normal limits  RETICULOCYTES - Abnormal; Notable for the following components:   RBC. 3.47 (*)    All other components within normal limits  LIPID PANEL - Abnormal; Notable for the following components:   HDL 24 (*)    All other components within normal limits  COMPREHENSIVE METABOLIC PANEL - Abnormal; Notable for the following components:   Sodium 134 (*)    CO2 21 (*)    Glucose, Bld 103  (*)    BUN 25 (*)    Creatinine, Ser 1.48 (*)    Calcium 8.3 (*)    Albumin 3.1 (*)    GFR, Estimated 50 (*)    All other components within normal limits  MAGNESIUM - Abnormal; Notable for the following components:   Magnesium 1.6 (*)    All other components within normal limits  GLUCOSE, CAPILLARY - Abnormal; Notable for the following components:   Glucose-Capillary 105 (*)    All other components within normal limits  BASIC METABOLIC PANEL - Abnormal; Notable for the following components:   Glucose, Bld 107 (*)    BUN 25 (*)    Creatinine, Ser 1.46 (*)    Calcium 8.5 (*)    GFR, Estimated 51 (*)    All other components within normal limits  GLUCOSE, CAPILLARY - Abnormal; Notable for the following components:   Glucose-Capillary 116 (*)    All other components within normal limits  TROPONIN I (HIGH SENSITIVITY) - Abnormal; Notable for the following components:   Troponin I (High Sensitivity) 62 (*)    All other components within normal limits  TROPONIN I (HIGH SENSITIVITY) - Abnormal; Notable for the following components:   Troponin I (High Sensitivity) 69 (*)    All other components within normal limits  MAGNESIUM  IRON AND TIBC  FERRITIN  HEMOGLOBIN A1C  LIPOPROTEIN A (LPA)  I-STAT CG4 LACTIC ACID, ED  I-STAT CG4 LACTIC ACID, ED    EKG EKG Interpretation Date/Time:  Thursday December 21 2022 21:18:21 EDT Ventricular Rate:  99 PR Interval:  184 QRS Duration:  122 QT Interval:  391 QTC Calculation: 502 R Axis:   -72  Text Interpretation: paced rhythm Nonspecific IVCD with LAD Anterolateral infarct, age indeterminate No significant change since prior 1/24 Confirmed by Meridee Score [phone]) on 12/21/2022 9:38:16 PM  Radiology DG Chest 2 View  Result Date: 12/21/2022 CLINICAL DATA:  Shortness of breath,  chest tightness EXAM: CHEST - 2 VIEW COMPARISON:  06/03/2022 FINDINGS: Left AICD remains in place, unchanged. Mild cardiomegaly, vascular congestion. Bibasilar  atelectasis and possible small effusions. No overt edema or acute bony abnormality. IMPRESSION: Cardiomegaly, vascular congestion. Bibasilar atelectasis.  Question small bilateral effusions. Electronically Signed   By: Charlett Nose M.D.   On: 12/21/2022 20:41    Procedures Procedures    Medications Ordered in ED Medications  sodium bicarbonate tablet 650 mg (650 mg Oral Given 12/22/22 0843)  allopurinol (ZYLOPRIM) tablet 300 mg (300 mg Oral Given 12/22/22 0842)  hydroxyurea (HYDREA) capsule 1,000 mg (1,000 mg Oral Given 12/22/22 1044)  carvedilol (COREG) tablet 3.125 mg (3.125 mg Oral Given 12/22/22 0843)  rosuvastatin (CRESTOR) tablet 5 mg (5 mg Oral Given 12/22/22 0631)  spironolactone (ALDACTONE) tablet 12.5 mg (12.5 mg Oral Given 12/22/22 0843)  citalopram (CELEXA) tablet 5 mg (5 mg Oral Given 12/22/22 0631)  pantoprazole (PROTONIX) EC tablet 40 mg (40 mg Oral Given 12/22/22 0842)  oxybutynin (DITROPAN-XL) 24 hr tablet 15 mg (15 mg Oral Given 12/22/22 0657)  tamsulosin (FLOMAX) capsule 0.4 mg (0.4 mg Oral Given 12/22/22 0842)  dicyclomine (BENTYL) tablet 20 mg (20 mg Oral Given 12/22/22 0843)  magnesium gluconate (MAGONATE) tablet 500 mg (500 mg Oral Given 12/22/22 1044)  budesonide (PULMICORT) nebulizer solution 0.5 mg (0.5 mg Nebulization Given 12/22/22 0845)  insulin aspart (novoLOG) injection 0-5 Units (has no administration in time range)  insulin aspart (novoLOG) injection 0-15 Units ( Subcutaneous Not Given 12/22/22 0623)  insulin glargine-yfgn (SEMGLEE) injection 20 Units (20 Units Subcutaneous Given 12/22/22 1044)  sodium chloride flush (NS) 0.9 % injection 3 mL (3 mLs Intravenous Given 12/22/22 1049)  sodium chloride flush (NS) 0.9 % injection 3 mL (has no administration in time range)  0.9 %  sodium chloride infusion (has no administration in time range)  acetaminophen (TYLENOL) tablet 650 mg (has no administration in time range)  ondansetron (ZOFRAN) injection 4 mg (has no administration  in time range)  heparin injection 5,000 Units (5,000 Units Subcutaneous Given 12/22/22 0631)  furosemide (LASIX) injection 80 mg (80 mg Intravenous Given 12/22/22 0844)  potassium chloride SA (KLOR-CON M) CR tablet 20 mEq (20 mEq Oral Given 12/22/22 0843)  hydrocerin (EUCERIN) cream ( Topical Given 12/22/22 0849)  furosemide (LASIX) injection 80 mg (80 mg Intravenous Given 12/21/22 2225)  furosemide (LASIX) 120 mg in dextrose 5 % 50 mL IVPB (0 mg Intravenous Stopped 12/22/22 0413)    ED Course/ Medical Decision Making/ A&P Clinical Course as of 12/22/22 1205  Thu Dec 21, 2022  2227 I discussed with the cardiology fellow Dr. Lendell Caprice who will consult on the patient.  He thought the patient would be better off on a medical service and cards will be the consulting team. [MB]  2235 Discussed with Dr. Imogene Burn [facility] hospitalist will evaluate patient for admission. [MB]    Clinical Course User Index [MB] Terrilee Files, MD                                 Medical Decision Making Amount and/or Complexity of Data Reviewed Labs: ordered. Radiology: ordered.  Risk Prescription drug management. Decision regarding hospitalization.   This patient complains of increased shortness of breath and weight gain; this involves an extensive number of treatment Options and is a complaint that carries with it a high risk of complications and morbidity. The differential includes CHF, peripheral edema, renal  failure, metabolic derangement, ACS, valvular disease  I ordered, reviewed and interpreted labs, which included CBC with normal white count, hemoglobin lower than baseline, patient denies any overt bleeding.  Chemistries with low bicarb elevated BUN and creatinine, troponins elevated need to be trended, BNP elevated I ordered medication IV Lasix and reviewed PMP when indicated. I ordered imaging studies which included chest x-ray and I independently    visualized and interpreted imaging which showed  cardiomegaly and CHF probable effusions Additional history obtained from patient's wife Previous records obtained and reviewed in epic including recent cardiology notes I consulted cardiology Dr. Lendell Caprice and [facility] hospitalist Dr. Imogene Burn and discussed lab and imaging findings and discussed disposition.  Cardiac monitoring reviewed, paced rhythm Social determinants considered, no significant barriers Critical Interventions: None  After the interventions stated above, I reevaluated the patient and found patient to be fairly comfortable at rest. Admission and further testing considered, patient would benefit from mission to the hospital for IV diuresis and cardiology input on any other medication adjustments.  Patient in agreement with plan for admission.         Final Clinical Impression(s) / ED Diagnoses Final diagnoses:  Acute congestive heart failure, unspecified heart failure type [facility])    Rx / DC Orders ED Discharge Orders     None         Terrilee Files, MD 12/22/22 1212

## 2022-12-21 NOTE — Assessment & Plan Note (Signed)
-   Continue with PPI 

## 2022-12-21 NOTE — Consult Note (Addendum)
Cardiology Consultation   Patient ID: William Villanueva MRN: [id number]; DOB: 1950/06/16  Admit date: 12/21/2022 Date of Consult: 12/21/2022  PCP:  Tally Joe, MD   [facility] Providers Cardiologist:  Olga Millers, MD  Electrophysiologist:  Sherryl Manges, MD       Patient Profile:   William Villanueva is a 72 y.o. 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 Charm Barges.  History of Present Illness:   William Villanueva reports having 3 weeks of progressive DOE and volume overload. During this time he states that he has gained ~25 lbs (EDW = 225-230 lbs), has had PND and orthopnea, LE edema and generalized fatigue. Also he has had decreased UOP despite adherence to his diuretic and anorexia. No other issues. He denies fevers, sore throat, rhinorrhea, recent sick contacts, chest pain, N/V/D, abdominal pain, rash, weakness/numbness, or dietary indiscretion. He came in today at the behest of his wife.  In the ED his VS were afebrile, HR 106, BP 108/65, RR 19 and satting 94% on RA. Labs notable for bicarb 18, sCr 1.37, hbg 9.8, plt 527, troponins 62 -> 69, BNP 889.4, and normal lactic acid. CXR showed volume overload. ECG showed a V paced rhythm without clear ischemic changes. He was given 80 mg IV lasix in the ED. Cardiology is consulted for management of CHF.    Past Medical History:  Diagnosis Date   AV block, 2nd degree 2015   St. Jude Allure Quadra pulse generator [id number], model PM 3242   Back pain    Cardiomyopathy (HCC)    Nonischemic 45%.    CHF (congestive heart failure) (HCC)    Diabetes mellitus without complication (HCC)    Gout    Hemochromatosis    Hypertension    Hypospadias October 14, 1950   born with   Nephrolithiasis     Polycythemia vera(238.4)     Past Surgical History:  Procedure Laterality Date   ANKLE SURGERY     BI-VENTRICULAR PACEMAKER INSERTION N/A 04/29/2014   Procedure: BI-VENTRICULAR PACEMAKER INSERTION (CRT-P);  Surgeon: Duke Salvia, MD; Laterality: Left  St. Jude Allure Quadra pulse generator [phone], model [place] 1478   CARDIAC SURGERY     COLONOSCOPY N/A 02/15/2020   Procedure: COLONOSCOPY;  Surgeon: Kerin Salen, MD;  Location: [facility] ENDOSCOPY;  Service: Gastroenterology;  Laterality: N/A;   ESOPHAGOGASTRODUODENOSCOPY (EGD) WITH PROPOFOL N/A 02/15/2020   Procedure: ESOPHAGOGASTRODUODENOSCOPY (EGD) WITH PROPOFOL;  Surgeon: Kerin Salen, MD;  Location: [facility] ENDOSCOPY;  Service: Gastroenterology;  Laterality: N/A;   LEAD INSERTION N/A 04/28/2022   Procedure: LEAD INSERTION;  Surgeon: Duke Salvia, MD;  Location: [facility] INVASIVE CV LAB;  Service: Cardiovascular;  Laterality: N/A;   PILONIDAL CYST EXCISION     POSTERIOR LAMINECTOMY / DECOMPRESSION LUMBAR SPINE     PPM GENERATOR CHANGEOUT N/A 04/28/2022   Procedure: PPM GENERATOR CHANGEOUT;  Surgeon: Duke Salvia, MD;  Location: [facility] INVASIVE CV LAB;  Service: Cardiovascular;  Laterality: N/A;   TONSILLECTOMY       Home Medications:  Prior to Admission medications   Medication Sig Start Date End Date Taking? Authorizing Provider  acetaminophen (TYLENOL) 500 MG tablet Take 1,000 mg by mouth every 6 (six) hours as needed for headache.   Yes [provider]  allopurinol (ZYLOPRIM) 300  MG tablet TAKE 1 TABLET BY MOUTH DAILY 10/24/22  Yes Rice, Jamesetta Orleans, MD  budesonide (PULMICORT) 0.5 MG/2ML nebulizer solution Take 2 mLs (0.5 mg total) by nebulization 2 (two) times daily. (RINSE MOUTH AFTER USE) 09/28/22  Yes Nyoka Cowden, MD  carvedilol (COREG) 3.125 MG tablet Take 1 tablet (3.125 mg total) by mouth 2 (two) times daily with a meal. 05/02/22  Yes Duke Salvia, MD  cholestyramine Lanetta Inch) 4 GM/DOSE powder Take 1 g by mouth 2 (two) times  daily as needed (diarrhea). 03/22/22  Yes [provider]  citalopram (CELEXA) 10 MG tablet Take 5 mg by mouth in the morning. 03/01/22  Yes [provider]  colchicine 0.6 MG tablet Take 1 tablet by mouth daily as needed for joint pain and swelling 09/06/22  Yes Rice, Jamesetta Orleans, MD  diclofenac Sodium (VOLTAREN) 1 % GEL Apply 2 g topically 4 (four) times daily. Apply to left shoulder for pain. Patient taking differently: Apply 2 g topically daily as needed (Apply to left shoulder for pain.). 02/14/22  Yes Rana Snare, DO  dicyclomine (BENTYL) 20 MG tablet Take 20 mg by mouth 2 (two) times daily. 03/28/22  Yes [provider]  furosemide (LASIX) 40 MG tablet Take 1-2 tablets (40-80 mg total) by mouth in the morning. 12/05/22  Yes Rondel Baton, MD  hydroxyurea (HYDREA) 500 MG capsule TAKE TWO CAPSULES BY MOUTH DAILY 07/21/22  Yes Ennever, Rose Phi, MD  insulin degludec (TRESIBA FLEXTOUCH) 100 UNIT/ML SOPN FlexTouch Pen Inject 20 Units into the skin daily after breakfast.    Yes [provider]  magnesium gluconate (MAGONATE) 500 MG tablet Take 500 mg by mouth in the morning and at bedtime.   Yes [provider]  omeprazole (PRILOSEC) 20 MG capsule Take 20 mg by mouth in the morning and at bedtime.   Yes [provider]  oxybutynin (DITROPAN XL) 15 MG 24 hr tablet Take 15 mg by mouth in the morning. 02/06/22  Yes [provider]  prednisoLONE acetate (PRED FORTE) 1 % ophthalmic suspension Place 1 drop into both eyes daily as needed (eye irritation.).   Yes [provider]  rosuvastatin (CRESTOR) 5 MG tablet Take 5 mg by mouth in the morning.   Yes [provider]  spironolactone (ALDACTONE) 25 MG tablet Take 0.5 tablets (12.5 mg total) by mouth daily. 04/18/22  Yes Lewayne Bunting, MD  tamsulosin (FLOMAX) 0.4 MG CAPS capsule Take 0.4 mg by mouth daily. 08/03/17  Yes [provider]  triamcinolone cream  (KENALOG) 0.1 % Apply 1 Application topically 2 (two) times daily. 10/09/22  Yes Louann Sjogren, DPM  Turmeric (QC TUMERIC COMPLEX) 500 MG CAPS Take 1 tablet by mouth daily.   Yes [provider]  Cyanocobalamin (B-12 PO) Take 1 tablet by mouth daily. Patient not taking: Reported on 10/17/2022    [provider]  folic acid (FOLVITE) 1 MG tablet Take 1 mg by mouth in the morning. Patient not taking: Reported on 12/21/2022    [provider]  ipratropium-albuterol (DUONEB) 0.5-2.5 (3) MG/3ML SOLN Inhale one vial in nebulizer twice daily and in between if needed Patient not taking: Reported on 12/21/2022 05/18/22   Nyoka Cowden, MD  ketoconazole (NIZORAL) 2 % cream Apply 1 Application topically daily. Patient not taking: Reported on 12/21/2022 12/22/21   [provider]  zolpidem (AMBIEN) 5 MG tablet Take 1 tablet (5 mg total) by mouth at bedtime as needed for sleep. Patient not  taking: Reported on 08/28/2022 02/10/22   Arrien, York Ram, MD    Inpatient Medications: Scheduled Meds:  Continuous Infusions:  PRN Meds:   Allergies:    Allergies  Allergen Reactions   Advil [Ibuprofen] Itching   Keflex [Cephalexin] Diarrhea and Other (See Comments)    Caused C-diff, also    Wellbutrin [Bupropion] Hives and Other (See Comments)   Prednisone Itching and Other (See Comments)    Abdominal pain, also    Temazepam Other (See Comments)    Dizziness    Trazodone And Nefazodone Other (See Comments)    Dizziness   Sacubitril-Valsartan Other (See Comments)   Prozac [Fluoxetine Hcl] Itching    Social History:   Social History   Socioeconomic History   Marital status: Married    Spouse name: Elease Hashimoto   Number of children: 0   Years of education: Not on file   Highest education level: High school graduate  Occupational History   Occupation: Retired  Tobacco Use   Smoking status: Former    Current packs/day: 0.00    Average packs/day: 1 pack/day  for 44.8 years (44.8 ttl pk-yrs)    Types: Cigarettes    Start date: 06/05/1968    Quit date: 04/05/2013    Years since quitting: 9.7    Passive exposure: Never   Smokeless tobacco: Never  Vaping Use   Vaping status: Never Used  Substance and Sexual Activity   Alcohol use: Yes    Alcohol/week: 0.0 standard drinks of alcohol    Comment: rare   Drug use: No   Sexual activity: Not Currently  Other Topics Concern   Not on file  Social History Narrative   Lives at home with wife in a one story home.  Has no children.  Does not work.  Getting workman's comp.  Education: 4 years trade school.    Social Determinants of Health   Financial Resource Strain: Low Risk  (02/06/2022)   Overall Financial Resource Strain (CARDIA)    Difficulty of Paying Living Expenses: Not very hard  Food Insecurity: No Food Insecurity (02/06/2022)   Hunger Vital Sign    Worried About Running Out of Food in the Last Year: Never true    Ran Out of Food in the Last Year: Never true  Transportation Needs: No Transportation Needs (02/06/2022)   PRAPARE - Administrator, Civil Service (Medical): No    Lack of Transportation (Non-Medical): No  Physical Activity: Not on file  Stress: Not on file  Social Connections: Unknown (09/15/2021)   [facility]   Social Network    Social Network: Not on file  Intimate Partner Violence: Unknown (08/10/2021)   Received from [facility]   HITS    Physically Hurt: Not on file    Insult or Talk Down To: Not on file    Threaten Physical Harm: Not on file    Scream or Curse: Not on file    Family History:    Family History  Problem Relation Age of Onset   Stroke Mother    Other Father        Deceased, car fell on him   Diabetes Sister    Hypertension Sister    Heart disease Maternal Grandmother        Pacemaker, MI     ROS:  Please see the history of present illness.  All other ROS reviewed and negative.      Physical Exam/Data:  Vitals:   12/21/22 1912  BP: 108/65  Pulse: (!) 106  Resp: 19  Temp: 97.9 F (36.6 C)  TempSrc: Oral  SpO2: 94%   No intake or output data in the 24 hours ending 12/21/22 2256    11/10/2022    8:11 AM 10/17/2022    9:55 AM 10/16/2022   10:32 AM  Last 3 Weights  Weight (lbs) 234 lb 232 lb 236 lb  Weight (kg) 106.142 kg 105.235 kg 107.049 kg     There is no height or weight on file to calculate BMI.  General:  Well nourished, well developed, in no acute distress but looks a bit uncomfortable HEENT: normal Neck: JVD to mid neck Vascular: No carotid bruits; Distal pulses 2+ bilaterally Cardiac:  normal S1, S2; RRR; no murmur Lungs:  Poor air exchange but clear to auscultation bilaterally, no wheezing, rhonchi or rales  Abd: soft, nontender, no hepatomegaly  Ext: 1+ edema bilaterally, L foot with dry skin and erythema around toes Musculoskeletal:  No deformities, BUE and BLE strength normal and equal Skin: warm and dry  Neuro:  CNs 2-12 intact, no focal abnormalities noted Psych:  Normal affect   EKG:  The EKG was personally reviewed and demonstrates:  V paced rhythm      Telemetry:  Telemetry was personally reviewed and demonstrates:  V paced  Relevant CV Studies:   CXR  IMPRESSION: Cardiomegaly, vascular congestion.   Bibasilar atelectasis.  Question small bilateral effusions.  NM SPECT 06/22/2014:   IMPRESSION: 1. No reversible ischemia or infarction.   2. Normal left ventricular wall motion.   3. Left ventricular ejection fraction 35%   4. Intermediate-risk stress test findings* secondary to reduced ejection fraction of 35%. Otherwise there is no ischemia identified. Personally reviewed echocardiogram which appears to have reduced ejection fraction in the 40% range.  cMRI 01/18/12:  Impression:     1)    Moderate LVE with diffuse hypokinesis  EF 34%         2)    No hyperenhancement or scar tissue  3)    No evidence of  cardiac hemachromatosis  4)    Moderatre LAE  5)    Normal RV/RA   Laboratory Data:  High Sensitivity Troponin:   Recent Labs  Lab 12/21/22 1922 12/21/22 2201  TROPONINIHS 62* 69*     Chemistry Recent Labs  Lab 12/21/22 1922 12/21/22 2201  NA 138  --   K 3.9  --   CL 105  --   CO2 18*  --   GLUCOSE 140*  --   BUN 24*  --   CREATININE 1.37*  --   CALCIUM 8.5*  --   MG  --  1.8  GFRNONAA 55*  --   ANIONGAP 15  --     No results for input(s): "PROT", "ALBUMIN", "AST", "ALT", "ALKPHOS", "BILITOT" in the last 168 hours. Lipids No results for input(s): "CHOL", "TRIG", "HDL", "LABVLDL", "LDLCALC", "CHOLHDL" in the last 168 hours.  Hematology Recent Labs  Lab 12/21/22 1922  WBC 9.4  RBC 3.42*  HGB 9.8*  HCT 34.7*  MCV 101.5*  MCH 28.7  MCHC 28.2*  RDW 18.6*  PLT 527*   Thyroid No results for input(s): "TSH", "FREET4" in the last 168 hours.  BNP Recent Labs  Lab 12/21/22 1922  BNP 889.4*    DDimer No results for input(s): "DDIMER" in the last 168 hours.   Radiology/Studies:  DG Chest 2 View  Result Date: 12/21/2022  CLINICAL DATA:  Shortness of breath, chest tightness EXAM: CHEST - 2 VIEW COMPARISON:  06/03/2022 FINDINGS: Left AICD remains in place, unchanged. Mild cardiomegaly, vascular congestion. Bibasilar atelectasis and possible small effusions. No overt edema or acute bony abnormality. IMPRESSION: Cardiomegaly, vascular congestion. Bibasilar atelectasis.  Question small bilateral effusions. Electronically Signed   By: Charlett Nose M.D.   On: 12/21/2022 20:41     Assessment and Plan:   William Villanueva is a 72 y.o. 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 Charm Barges.  #Acute on Chronic HFrEF Exacerbation (EF = 40%)  s/p St Jude CRT-D #High Degree AVB ::Volume overloaded on exam and BNP elevated. Unclear what the trigger for his decompensation is at this time. He did not urinate much to the 80 IV lasix so will give additional IV dose now. Will also need his ICD interrogated to ensure that it is optimized for synchrony.  -give 120 mg IV lasix -goal net neg 1-2L -continue home coreg -continue spironolactone 12.5 mg daily -not on entresto due to hypotension -not on ACEI/ARB due to orthostasis -not on SGLT2 due to risk of GU infections -strict I/Os, daily weights -TTE complete -EP device interrogation of his ICD  #HTN #Non-Obstructive CAD -continue crestor -lipid panel -Lp(a)  #Elevated Troponins ::Flat troponins without associated ECG changes. Represents myocardial injury in the setting of decompensated CHF. No concern for ACS at this time.   #Foot Erythema with Dry Skin ::L foot does not appear to be cellulitic. I think he just has dry skin +/- tinea. Will order eucerin cream for now and hopefully with reducing his swelling his erythema will resolve. May need topical antifungals otherwise.    Risk Assessment/Risk Scores:        New York Heart Association (NYHA) Functional Class NYHA Class IV        For questions or updates, please contact [facility] Please consult [email or web address] for contact info under    Signed, Karl Ito, MD  12/21/2022 10:56 PM

## 2022-12-21 NOTE — Assessment & Plan Note (Addendum)
Observation med/tele bed. Continue with IV lasix 80 mg bid. Update echo. Has reported allergy to Entresto(hypotension in 2022).

## 2022-12-21 NOTE — Assessment & Plan Note (Signed)
Stable

## 2022-12-21 NOTE — Assessment & Plan Note (Signed)
Continue with prn supplemental O2.

## 2022-12-21 NOTE — Assessment & Plan Note (Signed)
Chronic. 

## 2022-12-21 NOTE — Subjective & Objective (Signed)
CC: SOB HPI: 72 year old male with a history of chronic systolic heart failure EF of 40%, history of VT status post permanent pacemaker, type 2 diabetes, morbid obesity, COPD, CKD stage IIIa, polycythemia vera, OSA, BPH, presented to the ER today with a 3-week history of worsening shortness of breath.  Patient took 2 days worth of 80 mg of Lasix at home only for 2 days last week.  He normally is on 40 mg a day of Lasix.  Has had increasing shortness of breath.  Only mild swelling in his legs.  Called his doctor who told him to go to the ER for evaluation.  He thinks his weight is up but he does not know how by how much.  His medications is managed by his wife.  Patient is unfamiliar with his medication history or his diagnoses.  Poor medical literacy.  On arrival to the ER, temp 97.9 heart rate 106 blood pressure 108/65 satting 94% on room air.  BMP 889  Magnesium 1.8  Sodium 138, potassium 3.9, bicarb 18, BUN 24, creatinine 1.37, glucose 140, calcium 8.5  White count 9.4, hemoglobin 9.8, platelets of 527  Chest x-ray shows left anterior chest wall AICD.  Mild cardiomegaly.  Mild pulmonary vascular congestion.  First troponin is 62, second troponin 69  EP is consulted cardiology.  [facility] hospitalist consulted.

## 2022-12-21 NOTE — H&P (Signed)
History and Physical    William Villanueva ZOX:[id number] DOB: Aug 24, 1950 DOA: 12/21/2022  DOS: the patient was seen and examined on 12/21/2022  PCP: Tally Joe, MD   Patient coming from: Home  I have personally briefly reviewed patient's old medical records in [facility]  CC: SOB HPI: 72 year old male with a history of chronic systolic heart failure EF of 40%, history of VT status post permanent pacemaker, type 2 diabetes, morbid obesity, COPD, CKD stage IIIa, polycythemia vera, OSA, BPH, presented to the ER today with a 3-week history of worsening shortness of breath.  Patient took 2 days worth of 80 mg of Lasix at home only for 2 days last week.  He normally is on 40 mg a day of Lasix.  Has had increasing shortness of breath.  Only mild swelling in his legs.  Called his doctor who told him to go to the ER for evaluation.  He thinks his weight is up but he does not know how by how much.  His medications is managed by his wife.  Patient is unfamiliar with his medication history or his diagnoses.  Poor medical literacy.  On arrival to the ER, temp 97.9 heart rate 106 blood pressure 108/65 satting 94% on room air.  BMP 889  Magnesium 1.8  Sodium 138, potassium 3.9, bicarb 18, BUN 24, creatinine 1.37, glucose 140, calcium 8.5  White count 9.4, hemoglobin 9.8, platelets of 527  Chest x-ray shows left anterior chest wall AICD.  Mild cardiomegaly.  Mild pulmonary vascular congestion.  First troponin is 62, second troponin 69  EP is consulted cardiology.  [facility] hospitalist consulted.   ED Course: BNP 800s, CXR mild pulmonary edema. On RA.  Review of Systems:  Review of Systems  Constitutional: Negative.  Negative for fever.  HENT: Negative.    Eyes: Negative.   Respiratory:  Positive for cough and shortness of breath.   Cardiovascular:  Positive for orthopnea.  Gastrointestinal: Negative.   Genitourinary: Negative.   Musculoskeletal: Negative.   Skin: Negative.    Neurological: Negative.   Endo/Heme/Allergies: Negative.   Psychiatric/Behavioral: Negative.    All other systems reviewed and are negative.   Past Medical History:  Diagnosis Date   AV block, 2nd degree 2015   [employer] Jude Allure Quadra pulse generator [id number], model PM 3242   Back pain    Cardiomyopathy (HCC)    Nonischemic 45%.    CHF (congestive heart failure) (HCC)    Diabetes mellitus without complication (HCC)    Fracture of lumbar vertebra without spinal cord injury, sequela 10/09/2022   Gout    Hemochromatosis    Hypertension    Hypospadias [birth year]   born with   Nephrolithiasis    Pacemaker lead malfunction-elevated threshold RV lead 07/31/2014   Polycythemia vera(238.4)     Past Surgical History:  Procedure Laterality Date   ANKLE SURGERY     BI-VENTRICULAR PACEMAKER INSERTION N/A 04/29/2014   Procedure: BI-VENTRICULAR PACEMAKER INSERTION (CRT-P);  Surgeon: Duke Salvia, MD; Laterality: Left  St. Jude Allure Quadra pulse generator [id number], model [place] 8295   CARDIAC SURGERY     COLONOSCOPY N/A 02/15/2020   Procedure: COLONOSCOPY;  Surgeon: Kerin Salen, MD;  Location: [facility] ENDOSCOPY;  Service: Gastroenterology;  Laterality: N/A;   ESOPHAGOGASTRODUODENOSCOPY (EGD) WITH PROPOFOL N/A 02/15/2020   Procedure: ESOPHAGOGASTRODUODENOSCOPY (EGD) WITH PROPOFOL;  Surgeon: Kerin Salen, MD;  Location: [facility] ENDOSCOPY;  Service: Gastroenterology;  Laterality: N/A;   LEAD INSERTION N/A 04/28/2022   Procedure: LEAD INSERTION;  Surgeon:  Duke Salvia, MD;  Location: [facility] INVASIVE CV LAB;  Service: Cardiovascular;  Laterality: N/A;   PILONIDAL CYST EXCISION     POSTERIOR LAMINECTOMY / DECOMPRESSION LUMBAR SPINE     PPM GENERATOR CHANGEOUT N/A 04/28/2022   Procedure: PPM GENERATOR CHANGEOUT;  Surgeon: Duke Salvia, MD;  Location: [facility] INVASIVE CV LAB;  Service: Cardiovascular;  Laterality: N/A;   TONSILLECTOMY       reports that he quit smoking about 9 years ago. His smoking use  included cigarettes. He started smoking about 54 years ago. He has a 44.8 pack-year smoking history. He has never been exposed to tobacco smoke. He has never used smokeless tobacco. He reports current alcohol use. He reports that he does not use drugs.  Allergies  Allergen Reactions   Advil [Ibuprofen] Itching   Keflex [Cephalexin] Diarrhea and Other (See Comments)    Caused C-diff, also    Wellbutrin [Bupropion] Hives and Other (See Comments)   Prednisone Itching and Other (See Comments)    Abdominal pain, also    Temazepam Other (See Comments)    Dizziness    Trazodone And Nefazodone Other (See Comments)    Dizziness   Sacubitril-Valsartan Other (See Comments)   Prozac [Fluoxetine Hcl] Itching    Family History  Problem Relation Age of Onset   Stroke Mother    Other Father        Deceased, car fell on him   Diabetes Sister    Hypertension Sister    Heart disease Maternal Grandmother        Pacemaker, MI    Prior to Admission medications   Medication Sig Start Date End Date Taking? Authorizing Provider  acetaminophen (TYLENOL) 500 MG tablet Take 1,000 mg by mouth every 6 (six) hours as needed for headache.   Yes [provider]  allopurinol (ZYLOPRIM) 300 MG tablet TAKE 1 TABLET BY MOUTH DAILY 10/24/22  Yes Rice, Jamesetta Orleans, MD  budesonide (PULMICORT) 0.5 MG/2ML nebulizer solution Take 2 mLs (0.5 mg total) by nebulization 2 (two) times daily. (RINSE MOUTH AFTER USE) 09/28/22  Yes Nyoka Cowden, MD  carvedilol (COREG) 3.125 MG tablet Take 1 tablet (3.125 mg total) by mouth 2 (two) times daily with a meal. 05/02/22  Yes Duke Salvia, MD  cholestyramine Lanetta Inch) 4 GM/DOSE powder Take 1 g by mouth 2 (two) times daily as needed (diarrhea). 03/22/22  Yes [provider]  citalopram (CELEXA) 10 MG tablet Take 5 mg by mouth in the morning. 03/01/22  Yes [provider]  colchicine 0.6 MG tablet Take 1 tablet by mouth daily as needed for joint pain  and swelling 09/06/22  Yes Rice, Jamesetta Orleans, MD  diclofenac Sodium (VOLTAREN) 1 % GEL Apply 2 g topically 4 (four) times daily. Apply to left shoulder for pain. Patient taking differently: Apply 2 g topically daily as needed (Apply to left shoulder for pain.). 02/14/22  Yes Rana Snare, DO  dicyclomine (BENTYL) 20 MG tablet Take 20 mg by mouth 2 (two) times daily. 03/28/22  Yes [provider]  furosemide (LASIX) 40 MG tablet Take 1-2 tablets (40-80 mg total) by mouth in the morning. 12/05/22  Yes Rondel Baton, MD  hydroxyurea (HYDREA) 500 MG capsule TAKE TWO CAPSULES BY MOUTH DAILY 07/21/22  Yes Ennever, Rose Phi, MD  insulin degludec (TRESIBA FLEXTOUCH) 100 UNIT/ML SOPN FlexTouch Pen Inject 20 Units into the skin daily after breakfast.    Yes [provider]  magnesium gluconate (MAGONATE) 500 MG  tablet Take 500 mg by mouth in the morning and at bedtime.   Yes [provider]  omeprazole (PRILOSEC) 20 MG capsule Take 20 mg by mouth in the morning and at bedtime.   Yes [provider]  oxybutynin (DITROPAN XL) 15 MG 24 hr tablet Take 15 mg by mouth in the morning. 02/06/22  Yes [provider]  prednisoLONE acetate (PRED FORTE) 1 % ophthalmic suspension Place 1 drop into both eyes daily as needed (eye irritation.).   Yes [provider]  rosuvastatin (CRESTOR) 5 MG tablet Take 5 mg by mouth in the morning.   Yes [provider]  spironolactone (ALDACTONE) 25 MG tablet Take 0.5 tablets (12.5 mg total) by mouth daily. 04/18/22  Yes Lewayne Bunting, MD  tamsulosin (FLOMAX) 0.4 MG CAPS capsule Take 0.4 mg by mouth daily. 08/03/17  Yes [provider]  triamcinolone cream (KENALOG) 0.1 % Apply 1 Application topically 2 (two) times daily. 10/09/22  Yes Louann Sjogren, DPM  Turmeric (QC TUMERIC COMPLEX) 500 MG CAPS Take 1 tablet by mouth daily.   Yes [provider]  Cyanocobalamin (B-12 PO) Take 1 tablet by mouth  daily. Patient not taking: Reported on 10/17/2022    [provider]  folic acid (FOLVITE) 1 MG tablet Take 1 mg by mouth in the morning. Patient not taking: Reported on 12/21/2022    [provider]  ipratropium-albuterol (DUONEB) 0.5-2.5 (3) MG/3ML SOLN Inhale one vial in nebulizer twice daily and in between if needed Patient not taking: Reported on 12/21/2022 05/18/22   Nyoka Cowden, MD  ketoconazole (NIZORAL) 2 % cream Apply 1 Application topically daily. Patient not taking: Reported on 12/21/2022 12/22/21   [provider]  zolpidem (AMBIEN) 5 MG tablet Take 1 tablet (5 mg total) by mouth at bedtime as needed for sleep. Patient not taking: Reported on 08/28/2022 02/10/22   Arrien, York Ram, MD    Physical Exam: Vitals:   12/21/22 1912 12/21/22 2145 12/21/22 2338  BP: 108/65 106/60   Pulse: (!) 106 98   Resp: 19 17   Temp: 97.9 F (36.6 C)  97.9 F (36.6 C)  TempSrc: Oral  Oral  SpO2: 94% 93%     Physical Exam Vitals and nursing note reviewed.  Constitutional:      General: He is not in acute distress.    Appearance: He is obese. He is not toxic-appearing or diaphoretic.     Comments: Appears chronically ill  HENT:     Head: Normocephalic and atraumatic.     Nose: Nose normal.  Eyes:     General: No scleral icterus. Cardiovascular:     Rate and Rhythm: Normal rate and regular rhythm.     Pulses: Normal pulses.     Heart sounds: Normal heart sounds.  Pulmonary:     Effort: No respiratory distress.     Breath sounds: Rales present. No wheezing.     Comments: Bedside thoracic ultrasound performed.  He is got a small effusion on the right approximately 250 mL and a trace effusion on the left side. Abdominal:     General: Abdomen is protuberant. Bowel sounds are normal. There is no distension.     Palpations: Abdomen is soft.  Musculoskeletal:     Right ankle: Swelling present.     Left ankle: Swelling present.     Right foot: Swelling  present.     Left foot: Swelling present.  Skin:    General: Skin is warm  and dry.     Capillary Refill: Capillary refill takes less than 2 seconds.  Neurological:     General: No focal deficit present.     Mental Status: He is alert and oriented to person, place, and time.      Labs on Admission: I have personally reviewed following labs and imaging studies  CBC: Recent Labs  Lab 12/21/22 1922  WBC 9.4  HGB 9.8*  HCT 34.7*  MCV 101.5*  PLT 527*   Basic Metabolic Panel: Recent Labs  Lab 12/21/22 1922 12/21/22 2201  NA 138  --   K 3.9  --   CL 105  --   CO2 18*  --   GLUCOSE 140*  --   BUN 24*  --   CREATININE 1.37*  --   CALCIUM 8.5*  --   MG  --  1.8   GFR: CrCl cannot be calculated (Unknown ideal weight.). Liver Function Tests: No results for input(s): "AST", "ALT", "ALKPHOS", "BILITOT", "PROT", "ALBUMIN" in the last 168 hours. No results for input(s): "LIPASE", "AMYLASE" in the last 168 hours. No results for input(s): "AMMONIA" in the last 168 hours. Coagulation Profile: Recent Labs  Lab 12/21/22 1922  INR 1.4*   Cardiac Enzymes: Recent Labs  Lab 12/21/22 1922 12/21/22 2201  TROPONINIHS 62* 69*   BNP (last 3 results) Recent Labs    04/01/22 1150 06/03/22 1220 12/21/22 1922  BNP 602.9* 527.0* 889.4*   HbA1C: No results for input(s): "HGBA1C" in the last 72 hours. CBG: No results for input(s): "GLUCAP" in the last 168 hours. Lipid Profile: No results for input(s): "CHOL", "HDL", "LDLCALC", "TRIG", "CHOLHDL", "LDLDIRECT" in the last 72 hours. Thyroid Function Tests: No results for input(s): "TSH", "T4TOTAL", "FREET4", "T3FREE", "THYROIDAB" in the last 72 hours. Anemia Panel: No results for input(s): "VITAMINB12", "FOLATE", "FERRITIN", "TIBC", "IRON", "RETICCTPCT" in the last 72 hours. Urine analysis:    Component Value Date/Time   COLORURINE YELLOW 06/03/2022 1058   APPEARANCEUR CLEAR 06/03/2022 1058   LABSPEC 1.020 06/03/2022 1058    LABSPEC 1.020 10/08/2015 0903   PHURINE 5.5 06/03/2022 1058   GLUCOSEU NEGATIVE 06/03/2022 1058   HGBUR NEGATIVE 06/03/2022 1058   BILIRUBINUR NEGATIVE 06/03/2022 1058   KETONESUR NEGATIVE 06/03/2022 1058   PROTEINUR NEGATIVE 06/03/2022 1058   UROBILINOGEN 0.2 10/08/2015 0903   NITRITE NEGATIVE 06/03/2022 1058   LEUKOCYTESUR NEGATIVE 06/03/2022 1058    Radiological Exams on Admission: I have personally reviewed images DG Chest 2 View  Result Date: 12/21/2022 CLINICAL DATA:  Shortness of breath, chest tightness EXAM: CHEST - 2 VIEW COMPARISON:  06/03/2022 FINDINGS: Left AICD remains in place, unchanged. Mild cardiomegaly, vascular congestion. Bibasilar atelectasis and possible small effusions. No overt edema or acute bony abnormality. IMPRESSION: Cardiomegaly, vascular congestion. Bibasilar atelectasis.  Question small bilateral effusions. Electronically Signed   By: Charlett Nose M.D.   On: 12/21/2022 20:41    EKG: My personal interpretation of EKG shows: paced rhythm    Assessment/Plan Principal Problem:   Acute on chronic systolic CHF (congestive heart failure) (HCC) Active Problems:   Polycythemia vera (HCC)   Pacemaker-CRT   Enlarged prostate without lower urinary tract symptoms (luts)   Essential hypertension   GERD (gastroesophageal reflux disease)   Obesity   Thrombocythemia   OSA (obstructive sleep apnea)   DM2 (diabetes mellitus, type 2) (HCC)   COPD (chronic obstructive pulmonary disease) (HCC)   CKD stage 3a, GFR 45-59 ml/min (HCC)   Chronic respiratory failure with hypoxia (HCC) - prn  2 L/min home O2   Bilateral pleural effusion    Assessment and Plan: * Acute on chronic systolic CHF (congestive heart failure) (HCC) Observation med/tele bed. Continue with IV lasix 80 mg bid. Update echo. Has reported allergy to Entresto(hypotension in 2022).  Chronic respiratory failure with hypoxia (HCC) - prn 2 L/min home O2 Continue with prn supplemental O2.  CKD stage 3a,  GFR 45-59 ml/min (HCC) Baseline Scr 1.25-1.46. monitor Scr during diuresis. Add po bicarb due to metabolic acidosis.  COPD (chronic obstructive pulmonary disease) (HCC) Stable. Continue with pulmicort and dounebs.  DM2 (diabetes mellitus, type 2) (HCC) Hold tresiba. Add lantus. Check A1c. Add ssi.  OSA (obstructive sleep apnea) Prn cpap  Thrombocythemia Continue with hydroxyurea.  Obesity Chronic.  GERD (gastroesophageal reflux disease) Continue with PPI.  Essential hypertension Stable. Continue with home BP meds.  Enlarged prostate without lower urinary tract symptoms (luts) Stable.  Pacemaker-CRT Chronic.  Polycythemia vera (HCC) Continue hydroxyurea, folic acid.  Bilateral pleural effusion Small on the right. Estimate only 250 ml, trace on the left. Not large enough to warrant thoracentesis.   DVT prophylaxis: SQ Heparin Code Status: Full Code Family Communication: no family at bedside  Disposition Plan: return home  Consults called: EDP has consulted cardiology Admission status: Observation, Telemetry bed   Carollee Herter, DO [facility] 12/21/2022, 11:52 PM

## 2022-12-21 NOTE — Assessment & Plan Note (Signed)
Hold tresiba. Add lantus. Check A1c. Add ssi.

## 2022-12-21 NOTE — Assessment & Plan Note (Signed)
Small on the right. Estimate only 250 ml, trace on the left. Not large enough to warrant thoracentesis.

## 2022-12-21 NOTE — Assessment & Plan Note (Signed)
Prn cpap

## 2022-12-21 NOTE — Assessment & Plan Note (Signed)
Stable. Continue with home BP meds.

## 2022-12-21 NOTE — Assessment & Plan Note (Addendum)
Baseline Scr 1.25-1.46. monitor Scr during diuresis. Add po bicarb due to metabolic acidosis.

## 2022-12-21 NOTE — Telephone Encounter (Signed)
Wife is calling in about fluid build up that the patient may have. Please advise

## 2022-12-21 NOTE — Telephone Encounter (Signed)
Returned call to pt in regards to concern of fluid up. Pt states his arms and legs are not swollen. Pt saw family doctor this morning and he was advised to call us due to "fluid around his heart". Advised pt that this message will be sent to Dr. Jens Som and his nurse for advice and recommendation. Pt was not able to really provide anymore information.

## 2022-12-22 ENCOUNTER — Observation Stay (HOSPITAL_COMMUNITY): Payer: PPO

## 2022-12-22 ENCOUNTER — Encounter (HOSPITAL_COMMUNITY): Payer: Self-pay | Admitting: Internal Medicine

## 2022-12-22 DIAGNOSIS — Z79899 Other long term (current) drug therapy: Secondary | ICD-10-CM | POA: Diagnosis not present

## 2022-12-22 DIAGNOSIS — I428 Other cardiomyopathies: Secondary | ICD-10-CM | POA: Diagnosis not present

## 2022-12-22 DIAGNOSIS — B359 Dermatophytosis, unspecified: Secondary | ICD-10-CM | POA: Diagnosis not present

## 2022-12-22 DIAGNOSIS — E1122 Type 2 diabetes mellitus with diabetic chronic kidney disease: Secondary | ICD-10-CM | POA: Diagnosis not present

## 2022-12-22 DIAGNOSIS — D45 Polycythemia vera: Secondary | ICD-10-CM | POA: Diagnosis not present

## 2022-12-22 DIAGNOSIS — Z95810 Presence of automatic (implantable) cardiac defibrillator: Secondary | ICD-10-CM | POA: Diagnosis not present

## 2022-12-22 DIAGNOSIS — I2489 Other forms of acute ischemic heart disease: Secondary | ICD-10-CM | POA: Diagnosis not present

## 2022-12-22 DIAGNOSIS — J9611 Chronic respiratory failure with hypoxia: Secondary | ICD-10-CM | POA: Diagnosis not present

## 2022-12-22 DIAGNOSIS — N1831 Chronic kidney disease, stage 3a: Secondary | ICD-10-CM | POA: Diagnosis not present

## 2022-12-22 DIAGNOSIS — R0989 Other specified symptoms and signs involving the circulatory and respiratory systems: Secondary | ICD-10-CM | POA: Diagnosis not present

## 2022-12-22 DIAGNOSIS — M79642 Pain in left hand: Secondary | ICD-10-CM | POA: Diagnosis not present

## 2022-12-22 DIAGNOSIS — R0602 Shortness of breath: Secondary | ICD-10-CM | POA: Diagnosis present

## 2022-12-22 DIAGNOSIS — I2510 Atherosclerotic heart disease of native coronary artery without angina pectoris: Secondary | ICD-10-CM | POA: Diagnosis not present

## 2022-12-22 DIAGNOSIS — I5021 Acute systolic (congestive) heart failure: Secondary | ICD-10-CM | POA: Diagnosis not present

## 2022-12-22 DIAGNOSIS — I272 Pulmonary hypertension, unspecified: Secondary | ICD-10-CM | POA: Diagnosis not present

## 2022-12-22 DIAGNOSIS — E669 Obesity, unspecified: Secondary | ICD-10-CM | POA: Diagnosis not present

## 2022-12-22 DIAGNOSIS — Z87891 Personal history of nicotine dependence: Secondary | ICD-10-CM | POA: Diagnosis not present

## 2022-12-22 DIAGNOSIS — Z6835 Body mass index (BMI) 35.0-35.9, adult: Secondary | ICD-10-CM | POA: Diagnosis not present

## 2022-12-22 DIAGNOSIS — N400 Enlarged prostate without lower urinary tract symptoms: Secondary | ICD-10-CM | POA: Diagnosis not present

## 2022-12-22 DIAGNOSIS — I130 Hypertensive heart and chronic kidney disease with heart failure and stage 1 through stage 4 chronic kidney disease, or unspecified chronic kidney disease: Secondary | ICD-10-CM | POA: Diagnosis not present

## 2022-12-22 DIAGNOSIS — Z950 Presence of cardiac pacemaker: Secondary | ICD-10-CM | POA: Diagnosis not present

## 2022-12-22 DIAGNOSIS — E872 Acidosis, unspecified: Secondary | ICD-10-CM | POA: Diagnosis not present

## 2022-12-22 DIAGNOSIS — I5023 Acute on chronic systolic (congestive) heart failure: Secondary | ICD-10-CM | POA: Diagnosis not present

## 2022-12-22 DIAGNOSIS — Z794 Long term (current) use of insulin: Secondary | ICD-10-CM | POA: Diagnosis not present

## 2022-12-22 DIAGNOSIS — I110 Hypertensive heart disease with heart failure: Secondary | ICD-10-CM | POA: Diagnosis not present

## 2022-12-22 DIAGNOSIS — J9811 Atelectasis: Secondary | ICD-10-CM | POA: Diagnosis not present

## 2022-12-22 DIAGNOSIS — Z8249 Family history of ischemic heart disease and other diseases of the circulatory system: Secondary | ICD-10-CM | POA: Diagnosis not present

## 2022-12-22 DIAGNOSIS — G4733 Obstructive sleep apnea (adult) (pediatric): Secondary | ICD-10-CM | POA: Diagnosis not present

## 2022-12-22 DIAGNOSIS — R0789 Other chest pain: Secondary | ICD-10-CM | POA: Diagnosis not present

## 2022-12-22 DIAGNOSIS — I509 Heart failure, unspecified: Secondary | ICD-10-CM | POA: Diagnosis not present

## 2022-12-22 DIAGNOSIS — I5A Non-ischemic myocardial injury (non-traumatic): Secondary | ICD-10-CM | POA: Diagnosis not present

## 2022-12-22 DIAGNOSIS — K219 Gastro-esophageal reflux disease without esophagitis: Secondary | ICD-10-CM | POA: Diagnosis not present

## 2022-12-22 DIAGNOSIS — J449 Chronic obstructive pulmonary disease, unspecified: Secondary | ICD-10-CM | POA: Diagnosis not present

## 2022-12-22 DIAGNOSIS — M19042 Primary osteoarthritis, left hand: Secondary | ICD-10-CM | POA: Diagnosis not present

## 2022-12-22 LAB — LIPID PANEL
Cholesterol: 62 mg/dL (ref 0–200)
HDL: 24 mg/dL — ABNORMAL LOW (ref >40)
LDL Cholesterol: 28 mg/dL (ref 0–99)
Total CHOL/HDL Ratio: 2.6 RATIO
Triglycerides: 51 mg/dL (ref <150)
VLDL: 10 mg/dL (ref 0–40)

## 2022-12-22 LAB — IRON AND TIBC
Iron: 133 ug/dL (ref 45–182)
Saturation Ratios: 33 % (ref 17.9–39.5)
TIBC: 409 ug/dL (ref 250–450)
UIBC: 276 ug/dL

## 2022-12-22 LAB — COMPREHENSIVE METABOLIC PANEL
ALT: 11 U/L (ref 0–44)
AST: 16 U/L (ref 15–41)
Albumin: 3.1 g/dL — ABNORMAL LOW (ref 3.5–5.0)
Alkaline Phosphatase: 62 U/L (ref 38–126)
Anion gap: 12 (ref 5–15)
BUN: 25 mg/dL — ABNORMAL HIGH (ref 8–23)
CO2: 21 mmol/L — ABNORMAL LOW (ref 22–32)
Calcium: 8.3 mg/dL — ABNORMAL LOW (ref 8.9–10.3)
Chloride: 101 mmol/L (ref 98–111)
Creatinine, Ser: 1.48 mg/dL — ABNORMAL HIGH (ref 0.61–1.24)
GFR, Estimated: 50 mL/min — ABNORMAL LOW (ref >60)
Glucose, Bld: 103 mg/dL — ABNORMAL HIGH (ref 70–99)
Potassium: 3.9 mmol/L (ref 3.5–5.1)
Sodium: 134 mmol/L — ABNORMAL LOW (ref 135–145)
Total Bilirubin: 0.8 mg/dL (ref 0.3–1.2)
Total Protein: 6.5 g/dL (ref 6.5–8.1)

## 2022-12-22 LAB — ECHOCARDIOGRAM COMPLETE
Est EF: <20
Height: 68 in
S' Lateral: 6.4 cm
Weight: 3911.84 oz

## 2022-12-22 LAB — BASIC METABOLIC PANEL
Anion gap: 11 (ref 5–15)
BUN: 25 mg/dL — ABNORMAL HIGH (ref 8–23)
CO2: 22 mmol/L (ref 22–32)
Calcium: 8.5 mg/dL — ABNORMAL LOW (ref 8.9–10.3)
Chloride: 103 mmol/L (ref 98–111)
Creatinine, Ser: 1.46 mg/dL — ABNORMAL HIGH (ref 0.61–1.24)
GFR, Estimated: 51 mL/min — ABNORMAL LOW (ref >60)
Glucose, Bld: 107 mg/dL — ABNORMAL HIGH (ref 70–99)
Potassium: 4.1 mmol/L (ref 3.5–5.1)
Sodium: 136 mmol/L (ref 135–145)

## 2022-12-22 LAB — RETICULOCYTES
Immature Retic Fract: 14.8 % (ref 2.3–15.9)
RBC.: 3.47 MIL/uL — ABNORMAL LOW (ref 4.22–5.81)
Retic Count, Absolute: 22.9 10*3/uL (ref 19.0–186.0)
Retic Ct Pct: 0.7 % (ref 0.4–3.1)

## 2022-12-22 LAB — HEMOGLOBIN A1C
Hgb A1c MFr Bld: 5.6 % (ref 4.8–5.6)
Mean Plasma Glucose: 114.02 mg/dL

## 2022-12-22 LAB — GLUCOSE, CAPILLARY
Glucose-Capillary: 105 mg/dL — ABNORMAL HIGH (ref 70–99)
Glucose-Capillary: 116 mg/dL — ABNORMAL HIGH (ref 70–99)
Glucose-Capillary: 102 mg/dL — ABNORMAL HIGH (ref 70–99)
Glucose-Capillary: 123 mg/dL — ABNORMAL HIGH (ref 70–99)

## 2022-12-22 LAB — FERRITIN: Ferritin: 50 ng/mL (ref 24–336)

## 2022-12-22 LAB — MAGNESIUM: Magnesium: 1.6 mg/dL — ABNORMAL LOW (ref 1.7–2.4)

## 2022-12-22 MED ORDER — POTASSIUM CHLORIDE CRYS ER 20 MEQ PO TBCR
20.0000 meq | EXTENDED_RELEASE_TABLET | Freq: Two times a day (BID) | ORAL | Status: DC
  Administered 2022-12-22 – 2022-12-24 (×5): 20 meq via ORAL
  Filled 2022-12-22 (×5): qty 1

## 2022-12-22 MED ORDER — ALLOPURINOL 100 MG PO TABS
300.0000 mg | ORAL_TABLET | Freq: Every day | ORAL | Status: DC
  Administered 2022-12-22 – 2022-12-24 (×3): 300 mg via ORAL
  Filled 2022-12-22 (×3): qty 3

## 2022-12-22 MED ORDER — SPIRONOLACTONE 12.5 MG HALF TABLET
12.5000 mg | ORAL_TABLET | Freq: Every day | ORAL | Status: DC
  Administered 2022-12-22 – 2022-12-24 (×3): 12.5 mg via ORAL
  Filled 2022-12-22 (×3): qty 1

## 2022-12-22 MED ORDER — MAGNESIUM GLUCONATE 500 MG PO TABS
500.0000 mg | ORAL_TABLET | Freq: Two times a day (BID) | ORAL | Status: DC
  Administered 2022-12-22 – 2022-12-24 (×5): 500 mg via ORAL
  Filled 2022-12-22 (×6): qty 1

## 2022-12-22 MED ORDER — ROSUVASTATIN CALCIUM 5 MG PO TABS
5.0000 mg | ORAL_TABLET | Freq: Every morning | ORAL | Status: DC
  Administered 2022-12-22 – 2022-12-24 (×3): 5 mg via ORAL
  Filled 2022-12-22 (×4): qty 1

## 2022-12-22 MED ORDER — CITALOPRAM HYDROBROMIDE 10 MG PO TABS
5.0000 mg | ORAL_TABLET | Freq: Every morning | ORAL | Status: DC
  Administered 2022-12-22 – 2022-12-24 (×3): 5 mg via ORAL
  Filled 2022-12-22 (×4): qty 1

## 2022-12-22 MED ORDER — CARVEDILOL 3.125 MG PO TABS
3.1250 mg | ORAL_TABLET | Freq: Two times a day (BID) | ORAL | Status: DC
  Administered 2022-12-22 – 2022-12-24 (×4): 3.125 mg via ORAL
  Filled 2022-12-22 (×4): qty 1

## 2022-12-22 MED ORDER — DICYCLOMINE HCL 20 MG PO TABS
20.0000 mg | ORAL_TABLET | Freq: Two times a day (BID) | ORAL | Status: DC
  Administered 2022-12-22 – 2022-12-24 (×5): 20 mg via ORAL
  Filled 2022-12-22 (×6): qty 1

## 2022-12-22 MED ORDER — TAMSULOSIN HCL 0.4 MG PO CAPS
0.4000 mg | ORAL_CAPSULE | Freq: Every day | ORAL | Status: DC
  Administered 2022-12-22 – 2022-12-24 (×3): 0.4 mg via ORAL
  Filled 2022-12-22 (×3): qty 1

## 2022-12-22 MED ORDER — ACETAMINOPHEN 325 MG PO TABS
650.0000 mg | ORAL_TABLET | ORAL | Status: DC | PRN
  Administered 2022-12-22: 650 mg via ORAL
  Filled 2022-12-22: qty 2

## 2022-12-22 MED ORDER — INSULIN ASPART 100 UNIT/ML IJ SOLN: 0.0000 [IU] | Freq: Three times a day (TID) | INTRAMUSCULAR | Status: DC

## 2022-12-22 MED ORDER — OXYBUTYNIN CHLORIDE ER 5 MG PO TB24
15.0000 mg | ORAL_TABLET | Freq: Every morning | ORAL | Status: DC
  Administered 2022-12-22 – 2022-12-24 (×3): 15 mg via ORAL
  Filled 2022-12-22 (×3): qty 1

## 2022-12-22 MED ORDER — BUDESONIDE 0.5 MG/2ML IN SUSP
2.0000 mL | Freq: Two times a day (BID) | RESPIRATORY_TRACT | Status: DC
  Administered 2022-12-22: 0.5 mg via RESPIRATORY_TRACT
  Filled 2022-12-22: qty 2

## 2022-12-22 MED ORDER — INSULIN GLARGINE-YFGN 100 UNIT/ML ~~LOC~~ SOLN
20.0000 [IU] | Freq: Every day | SUBCUTANEOUS | Status: DC
  Administered 2022-12-22 – 2022-12-24 (×3): 20 [IU] via SUBCUTANEOUS
  Filled 2022-12-22 (×3): qty 0.2

## 2022-12-22 MED ORDER — PERFLUTREN LIPID MICROSPHERE
1.0000 mL | INTRAVENOUS | Status: AC | PRN
  Administered 2022-12-22: 5 mL via INTRAVENOUS

## 2022-12-22 MED ORDER — FUROSEMIDE 10 MG/ML IJ SOLN
120.0000 mg | Freq: Once | INTRAVENOUS | Status: AC
  Administered 2022-12-22: 120 mg via INTRAVENOUS
  Filled 2022-12-22: qty 10

## 2022-12-22 MED ORDER — INSULIN ASPART 100 UNIT/ML IJ SOLN: 0.0000 [IU] | Freq: Every day | INTRAMUSCULAR | Status: DC

## 2022-12-22 MED ORDER — HYDROCERIN EX CREA
TOPICAL_CREAM | Freq: Two times a day (BID) | CUTANEOUS | Status: DC
  Filled 2022-12-22: qty 113

## 2022-12-22 MED ORDER — HEPARIN SODIUM (PORCINE) 5000 UNIT/ML IJ SOLN
5000.0000 [IU] | Freq: Three times a day (TID) | INTRAMUSCULAR | Status: DC
  Administered 2022-12-22 – 2022-12-24 (×7): 5000 [IU] via SUBCUTANEOUS
  Filled 2022-12-22 (×7): qty 1

## 2022-12-22 MED ORDER — FUROSEMIDE 10 MG/ML IJ SOLN
80.0000 mg | Freq: Two times a day (BID) | INTRAMUSCULAR | Status: DC
  Administered 2022-12-22 – 2022-12-23 (×4): 80 mg via INTRAVENOUS
  Filled 2022-12-22 (×5): qty 8

## 2022-12-22 MED ORDER — SODIUM CHLORIDE 0.9% FLUSH
3.0000 mL | INTRAVENOUS | Status: DC | PRN
  Administered 2022-12-24: 3 mL via INTRAVENOUS

## 2022-12-22 MED ORDER — PANTOPRAZOLE SODIUM 40 MG PO TBEC
40.0000 mg | DELAYED_RELEASE_TABLET | Freq: Two times a day (BID) | ORAL | Status: DC
  Administered 2022-12-22 – 2022-12-24 (×5): 40 mg via ORAL
  Filled 2022-12-22 (×5): qty 1

## 2022-12-22 MED ORDER — HYDROXYUREA 500 MG PO CAPS
1000.0000 mg | ORAL_CAPSULE | Freq: Every day | ORAL | Status: DC
  Administered 2022-12-22 – 2022-12-24 (×3): 1000 mg via ORAL
  Filled 2022-12-22 (×3): qty 2

## 2022-12-22 MED ORDER — ONDANSETRON HCL 4 MG/2ML IJ SOLN: 4.0000 mg | Freq: Four times a day (QID) | INTRAMUSCULAR | Status: DC | PRN

## 2022-12-22 MED ORDER — SODIUM CHLORIDE 0.9 % IV SOLN: 250.0000 mL | INTRAVENOUS | Status: DC | PRN

## 2022-12-22 MED ORDER — SODIUM CHLORIDE 0.9% FLUSH
3.0000 mL | Freq: Two times a day (BID) | INTRAVENOUS | Status: DC
  Administered 2022-12-22 – 2022-12-23 (×5): 3 mL via INTRAVENOUS

## 2022-12-22 MED ORDER — BUDESONIDE 0.5 MG/2ML IN SUSP
2.0000 mL | Freq: Two times a day (BID) | RESPIRATORY_TRACT | Status: DC
  Administered 2022-12-22 – 2022-12-24 (×4): 0.5 mg via RESPIRATORY_TRACT
  Filled 2022-12-22 (×4): qty 2

## 2022-12-22 NOTE — ED Notes (Signed)
ED TO INPATIENT HANDOFF REPORT  ED Nurse Name and Phone #: [phone]  S Name/Age/Gender William Villanueva 72 y.o. male Room/Bed: 031C/031C  Code Status   Code Status: Full Code  Home/SNF/Other Home Patient oriented to: self, place, time, and situation Is this baseline? Yes   Triage Complete: Triage complete  Chief Complaint Acute on chronic systolic CHF (congestive heart failure) (HCC) [I50.23]  Triage Note Patient reports increasing SOB with tightness worse with exertion , occasional dry cough onset 2 weeks ago . His cardiologist is Dr. Jens Som.    Allergies Allergies  Allergen Reactions   Advil [Ibuprofen] Itching   Keflex [Cephalexin] Diarrhea and Other (See Comments)    Caused C-diff, also    Wellbutrin [Bupropion] Hives and Other (See Comments)   Prednisone Itching and Other (See Comments)    Abdominal pain, also    Temazepam Other (See Comments)    Dizziness    Trazodone And Nefazodone Other (See Comments)    Dizziness   Sacubitril-Valsartan Other (See Comments)   Prozac [Fluoxetine Hcl] Itching    Level of Care/Admitting Diagnosis ED Disposition     ED Disposition  Admit   Condition  --   Comment  Hospital Area: [facility] [[id number]]  Level of Care: Telemetry Medical [104]  May place patient in observation at [facility] or [facility] if equivalent level of care is available:: No  Covid Evaluation: Asymptomatic - no recent exposure (last 10 days) testing not required  Diagnosis: Acute on chronic systolic CHF (congestive heart failure) [facility]) [960454]  Admitting Physician: Imogene Burn ERIC [[id number]]  Attending Physician: Imogene Burn, ERIC [[id number]]          B Medical/Surgery History Past Medical History:  Diagnosis Date   AV block, 2nd degree 2015   St. Jude Allure Quadra pulse generator [id number], model PM 3242   Back pain    Cardiomyopathy (HCC)    Nonischemic 45%.    CHF (congestive heart failure) (HCC)    Diabetes mellitus without  complication (HCC)    Fracture of lumbar vertebra without spinal cord injury, sequela 10/09/2022   Gout    Hemochromatosis    Hypertension    Hypospadias Sep 03, 1950   born with   Nephrolithiasis    Pacemaker lead malfunction-elevated threshold RV lead 07/31/2014   Polycythemia vera(238.4)    Past Surgical History:  Procedure Laterality Date   ANKLE SURGERY     BI-VENTRICULAR PACEMAKER INSERTION N/A 04/29/2014   Procedure: BI-VENTRICULAR PACEMAKER INSERTION (CRT-P);  Surgeon: Duke Salvia, MD; Laterality: Left  St. Jude Allure Quadra pulse generator [id number], model [place] 4782   CARDIAC SURGERY     COLONOSCOPY N/A 02/15/2020   Procedure: COLONOSCOPY;  Surgeon: Kerin Salen, MD;  Location: [facility] ENDOSCOPY;  Service: Gastroenterology;  Laterality: N/A;   ESOPHAGOGASTRODUODENOSCOPY (EGD) WITH PROPOFOL N/A 02/15/2020   Procedure: ESOPHAGOGASTRODUODENOSCOPY (EGD) WITH PROPOFOL;  Surgeon: Kerin Salen, MD;  Location: [facility] ENDOSCOPY;  Service: Gastroenterology;  Laterality: N/A;   LEAD INSERTION N/A 04/28/2022   Procedure: LEAD INSERTION;  Surgeon: Duke Salvia, MD;  Location: [facility] INVASIVE CV LAB;  Service: Cardiovascular;  Laterality: N/A;   PILONIDAL CYST EXCISION     POSTERIOR LAMINECTOMY / DECOMPRESSION LUMBAR SPINE     PPM GENERATOR CHANGEOUT N/A 04/28/2022   Procedure: PPM GENERATOR CHANGEOUT;  Surgeon: Duke Salvia, MD;  Location: [facility] INVASIVE CV LAB;  Service: Cardiovascular;  Laterality: N/A;   TONSILLECTOMY       A IV Location/Drains/Wounds Patient Lines/Drains/Airways Status  Active Line/Drains/Airways     Name Placement date Placement time Site Days   Peripheral IV 12/21/22 20 G Anterior;Distal;Left;Upper Arm 12/21/22  2158  Arm  1   Airway 06/03/22  1300  -- 202            Intake/Output Last 24 hours No intake or output data in the 24 hours ending 12/22/22 0003  Labs/Imaging Results for orders placed or performed during the hospital encounter of 12/21/22 (from the  past 48 hour(s))  Basic metabolic panel     Status: Abnormal   Collection Time: 12/21/22  7:22 PM  Result Value Ref Range   Sodium 138 135 - 145 mmol/L   Potassium 3.9 3.5 - 5.1 mmol/L   Chloride 105 98 - 111 mmol/L   CO2 18 (L) 22 - 32 mmol/L   Glucose, Bld 140 (H) 70 - 99 mg/dL    Comment: Glucose reference range applies only to samples taken after fasting for at least 8 hours.   BUN 24 (H) 8 - 23 mg/dL   Creatinine, Ser 1.91 (H) 0.61 - 1.24 mg/dL   Calcium 8.5 (L) 8.9 - 10.3 mg/dL   GFR, Estimated 55 (L) >60 mL/min    Comment: (NOTE) Calculated using the CKD-EPI Creatinine Equation (2021)    Anion gap 15 5 - 15    Comment: Performed at [facility] Lab, [address] [place], [place] [address]  CBC     Status: Abnormal   Collection Time: 12/21/22  7:22 PM  Result Value Ref Range   WBC 9.4 4.0 - 10.5 K/uL   RBC 3.42 (L) 4.22 - 5.81 MIL/uL   Hemoglobin 9.8 (L) 13.0 - 17.0 g/dL   HCT 56.2 (L) 13.0 - 86.5 %   MCV 101.5 (H) 80.0 - 100.0 fL   MCH 28.7 26.0 - 34.0 pg   MCHC 28.2 (L) 30.0 - 36.0 g/dL   RDW 78.4 (H) 69.6 - 29.5 %   Platelets 527 (H) 150 - 400 K/uL   nRBC 0.2 0.0 - 0.2 %    Comment: Performed at [facility] Lab, [address], [place], [place] [address]  Troponin I (High Sensitivity)     Status: Abnormal   Collection Time: 12/21/22  7:22 PM  Result Value Ref Range   Troponin I (High Sensitivity) 62 (H) <18 ng/L    Comment: (NOTE) Elevated high sensitivity troponin I (hsTnI) values and significant  changes across serial measurements may suggest ACS but many other  chronic and acute conditions are known to elevate hsTnI results.  Refer to the "Links" section for chest pain algorithms and additional  guidance. Performed at [facility] Lab, [address], [place], [place] [address]   Protime-INR (order if Patient is taking Coumadin / Warfarin)     Status: Abnormal   Collection Time: 12/21/22  7:22 PM  Result Value Ref Range   Prothrombin Time 17.8  (H) 11.4 - 15.2 seconds   INR 1.4 (H) 0.8 - 1.2    Comment: (NOTE) INR goal varies based on device and disease states. Performed at [facility] Lab, [address] [place], [place] [address]   Brain natriuretic peptide     Status: Abnormal   Collection Time: 12/21/22  7:22 PM  Result Value Ref Range   B Natriuretic Peptide 889.4 (H) 0.0 - 100.0 pg/mL    Comment: Performed at [facility], [address] [place], [place] [address]  Troponin I (High Sensitivity)  Status: Abnormal   Collection Time: 12/21/22 10:01 PM  Result Value Ref Range   Troponin I (High Sensitivity) 69 (H) <18 ng/L    Comment: (NOTE) Elevated high sensitivity troponin I (hsTnI) values and significant  changes across serial measurements may suggest ACS but many other  chronic and acute conditions are known to elevate hsTnI results.  Refer to the "Links" section for chest pain algorithms and additional  guidance. Performed at [facility] Lab, [address] [place], [place] [address]   Magnesium     Status: None   Collection Time: 12/21/22 10:01 PM  Result Value Ref Range   Magnesium 1.8 1.7 - 2.4 mg/dL    Comment: Performed at [facility] Lab, [address] [place], [place] [address]  I-Stat CG4 Lactic Acid     Status: None   Collection Time: 12/21/22 11:24 PM  Result Value Ref Range   Lactic Acid, Venous 1.3 0.5 - 1.9 mmol/L   *Note: Due to a large number of results and/or encounters for the requested time period, some results have not been displayed. A complete set of results can be found in Results Review.   DG Chest 2 View  Result Date: 12/21/2022 CLINICAL DATA:  Shortness of breath, chest tightness EXAM: CHEST - 2 VIEW COMPARISON:  06/03/2022 FINDINGS: Left AICD remains in place, unchanged. Mild cardiomegaly, vascular congestion. Bibasilar atelectasis and possible small effusions. No overt edema or acute bony abnormality. IMPRESSION: Cardiomegaly, vascular congestion. Bibasilar  atelectasis.  Question small bilateral effusions. Electronically Signed   By: Charlett Nose M.D.   On: 12/21/2022 20:41    Pending Labs Unresulted Labs (From admission, onward)    None       Vitals/Pain Today's Vitals   12/21/22 1912 12/21/22 1914 12/21/22 2145 12/21/22 2338  BP: 108/65  106/60   Pulse: (!) 106  98   Resp: 19  17   Temp: 97.9 F (36.6 C)   97.9 F (36.6 C)  TempSrc: Oral   Oral  SpO2: 94%  93%   PainSc:  0-No pain      Isolation Precautions No active isolations  Medications Medications  sodium bicarbonate tablet 650 mg (has no administration in time range)  furosemide (LASIX) injection 80 mg (80 mg Intravenous Given 12/21/22 2225)    Mobility walks with person assist     Focused Assessments Cardiac Assessment Handoff:    Lab Results  Component Value Date   TROPONINI 0.03 (HH) 05/27/2016   Lab Results  Component Value Date   DDIMER 1.04 (H) 02/03/2022   Does the Patient currently have chest pain? No    R Recommendations: See Admitting Provider Note  Report given to:   Additional Notes: n/a

## 2022-12-22 NOTE — Plan of Care (Signed)
  Problem: Education: Goal: Knowledge of General Education information will improve Description Including pain rating scale, medication(s)/side effects and non-pharmacologic comfort measures Outcome: Progressing   

## 2022-12-22 NOTE — Progress Notes (Signed)
Progress Note   Patient: William Villanueva ZOX:[id number] DOB: 09-Oct-1950 DOA: 12/21/2022     0 DOS: the patient was seen and examined on 12/22/2022    Subjective:  Patient seen and examined at bedside this morning Denies nausea vomiting abdominal pain Respiratory function is improving Continues to have significant extremity edema  Brief hospital course: From HPI "72 year old male with a history of chronic systolic heart failure EF of 40%, history of VT status post permanent pacemaker, type 2 diabetes, morbid obesity, COPD, CKD stage IIIa, polycythemia vera, OSA, BPH, presented to the ER today with a 3-week history of worsening shortness of breath.  Patient took 2 days worth of 80 mg of Lasix at home only for 2 days last week.  He normally is on 40 mg a day of Lasix.  Has had increasing shortness of breath.  Only mild swelling in his legs.  Called his doctor who told him to go to the ER for evaluation.  He thinks his weight is up but he does not know how by how much.  His medications is managed by his wife.  Patient is unfamiliar with his medication history or his diagnoses.  Poor medical literacy.   On arrival to the ER, temp 97.9 heart rate 106 blood pressure 108/65 satting 94% on room air.  "    Assessment and Plan:  Acute on chronic systolic CHF (congestive heart failure) (HCC) Patient with significant BNP elevation Also with significant crackles on examination Cardiologist on board We will continue aggressive diuresis with IV Lasix 80 mg twice daily Follow-up on echocardiogram Monitor input and output as well as daily weights Has reported allergy to Entresto(hypotension in 2022). Audiologist on board and case discussed   Chronic respiratory failure with hypoxia (HCC) - prn 2 L/min home O2 Continue supplemental oxygen   CKD stage 3a, GFR 45-59 ml/min (HCC) Baseline Scr 1.25-1.46. monitor Scr during diuresis.  Added po bicarb due to metabolic acidosis. Continue to monitor renal  function closely  COPD (chronic obstructive pulmonary disease) (HCC) Stable.   Continue with pulmicort and dounebs.   DM2 (diabetes mellitus, type 2) (HCC) Continue to hold tresiba.  Continue Lantus  Continue carb controlled diet   OSA (obstructive sleep apnea) Continue CPAP at night   Thrombocythemia Continue with hydroxyurea.   Obesity Counseled on weight loss when medically stable   GERD (gastroesophageal reflux disease) Continue PPI   Essential hypertension Stable. Continue with home BP meds.   Enlarged prostate without lower urinary tract symptoms (luts) Stable.   Pacemaker-CRT Chronic.   Polycythemia vera (HCC) Continue hydroxyurea, folic acid.   Bilateral pleural effusion Small on the right. Estimate only 250 ml, trace on the left. Not large enough to warrant thoracentesis.     DVT prophylaxis: Continue SQ Heparin  Code Status: Full Code   Disposition Plan: return home     Physical Exam:  General: Appears acutely ill not in any distress HENT: Normocephalic atraumatic Eyes:     General: No scleral icterus. Cardiovascular:     Rate and Rhythm: Normal rate and regular rhythm.     Pulses: Normal pulses.     Heart sounds: Normal heart sounds.  Pulmonary: Decreased air entry especially at the bases with crackles Abdominal:     General: Abdomen is protuberant. Bowel sounds are normal. There is no distension.     Palpations: Abdomen is soft.  Musculoskeletal: Bilateral lower extremity pitting edema Skin:    General: Skin is warm and dry.  Capillary Refill: Capillary refill takes less than 2 seconds.  Neurological:     General: No focal deficit present.     Mental Status: He is alert and oriented to person, place, and time.      Vitals:   12/22/22 0340 12/22/22 0747 12/22/22 0852 12/22/22 1111  BP:  119/71  110/73  Pulse: 91   93  Resp: 20 18  18   Temp:  98 F (36.7 C)  98 F (36.7 C)  TempSrc:  Oral  Oral  SpO2: 97% 96% 96% 95%   Weight:      Height:        Data Reviewed: I have personally reviewed patient's CBC, BMP results as shown below. I have also personally reviewed patient's chest x-ray showing findings consistent with vascular congestion     Latest Ref Rng & Units 12/21/2022    7:22 PM 11/10/2022    7:50 AM 08/11/2022    8:11 AM  CBC  WBC 4.0 - 10.5 K/uL 9.4  8.2  6.4   Hemoglobin 13.0 - 17.0 g/dL 9.8  91.4  78.2   Hematocrit 39.0 - 52.0 % 34.7  37.9  38.5   Platelets 150 - 400 K/uL 527  494  455        Latest Ref Rng & Units 12/22/2022   10:58 AM 12/22/2022    8:20 AM 12/21/2022    7:22 PM  CMP  Glucose 70 - 99 mg/dL 956  213  086   BUN 8 - 23 mg/dL 25  25  24    Creatinine 0.61 - 1.24 mg/dL 5.78  4.69  6.29   Sodium 135 - 145 mmol/L 136  134  138   Potassium 3.5 - 5.1 mmol/L 4.1  3.9  3.9   Chloride 98 - 111 mmol/L 103  101  105   CO2 22 - 32 mmol/L 22  21  18    Calcium 8.9 - 10.3 mg/dL 8.5  8.3  8.5   Total Protein 6.5 - 8.1 g/dL  6.5    Total Bilirubin 0.3 - 1.2 mg/dL  0.8    Alkaline Phos 38 - 126 U/L  62    AST 15 - 41 U/L  16    ALT 0 - 44 U/L  11       Author: Loyce Dys, MD 12/22/2022 3:59 PM  For on call review [email or web address].

## 2022-12-22 NOTE — ED Notes (Signed)
ED TO INPATIENT HANDOFF REPORT  ED Nurse Name and Phone #: [phone]  S Name/Age/Gender William Villanueva 72 y.o. male Room/Bed: 031C/031C  Code Status   Code Status: Full Code  Home/SNF/Other Home Patient oriented to: self, place, time, and situation Is this baseline? Yes   Triage Complete: Triage complete  Chief Complaint Acute on chronic systolic CHF (congestive heart failure) (HCC) [I50.23]  Triage Note Patient reports increasing SOB with tightness worse with exertion , occasional dry cough onset 2 weeks ago . His cardiologist is Dr. Jens Som.    Allergies Allergies  Allergen Reactions   Advil [Ibuprofen] Itching   Keflex [Cephalexin] Diarrhea and Other (See Comments)    Caused C-diff, also    Wellbutrin [Bupropion] Hives and Other (See Comments)   Prednisone Itching and Other (See Comments)    Abdominal pain, also    Temazepam Other (See Comments)    Dizziness    Trazodone And Nefazodone Other (See Comments)    Dizziness   Sacubitril-Valsartan Other (See Comments)   Prozac [Fluoxetine Hcl] Itching    Level of Care/Admitting Diagnosis ED Disposition     ED Disposition  Admit   Condition  --   Comment  Hospital Area: [facility] [[id number]]  Level of Care: Telemetry Medical [104]  May place patient in observation at [facility] or [facility] if equivalent level of care is available:: No  Covid Evaluation: Asymptomatic - no recent exposure (last 10 days) testing not required  Diagnosis: Acute on chronic systolic CHF (congestive heart failure) [facility]) [[id number]]  Admitting Physician: Imogene Burn ERIC [[id number]]  Attending Physician: Imogene Burn, ERIC [[id number]]          B Medical/Surgery History Past Medical History:  Diagnosis Date   AV block, 2nd degree 2015   St. Jude Allure Quadra pulse generator [id number], model PM 3242   Back pain    Cardiomyopathy (HCC)    Nonischemic 45%.    CHF (congestive heart failure) (HCC)    Diabetes mellitus without  complication (HCC)    Fracture of lumbar vertebra without spinal cord injury, sequela 10/09/2022   Gout    Hemochromatosis    Hypertension    Hypospadias 06/22/1950   born with   Nephrolithiasis    Pacemaker lead malfunction-elevated threshold RV lead 07/31/2014   Polycythemia vera(238.4)    Past Surgical History:  Procedure Laterality Date   ANKLE SURGERY     BI-VENTRICULAR PACEMAKER INSERTION N/A 04/29/2014   Procedure: BI-VENTRICULAR PACEMAKER INSERTION (CRT-P);  Surgeon: Duke Salvia, MD; Laterality: Left  St. Jude Allure Quadra pulse generator [id number], model [place] 6295   CARDIAC SURGERY     COLONOSCOPY N/A 02/15/2020   Procedure: COLONOSCOPY;  Surgeon: Kerin Salen, MD;  Location: [facility] ENDOSCOPY;  Service: Gastroenterology;  Laterality: N/A;   ESOPHAGOGASTRODUODENOSCOPY (EGD) WITH PROPOFOL N/A 02/15/2020   Procedure: ESOPHAGOGASTRODUODENOSCOPY (EGD) WITH PROPOFOL;  Surgeon: Kerin Salen, MD;  Location: [facility] ENDOSCOPY;  Service: Gastroenterology;  Laterality: N/A;   LEAD INSERTION N/A 04/28/2022   Procedure: LEAD INSERTION;  Surgeon: Duke Salvia, MD;  Location: [facility] INVASIVE CV LAB;  Service: Cardiovascular;  Laterality: N/A;   PILONIDAL CYST EXCISION     POSTERIOR LAMINECTOMY / DECOMPRESSION LUMBAR SPINE     PPM GENERATOR CHANGEOUT N/A 04/28/2022   Procedure: PPM GENERATOR CHANGEOUT;  Surgeon: Duke Salvia, MD;  Location: [facility] INVASIVE CV LAB;  Service: Cardiovascular;  Laterality: N/A;   TONSILLECTOMY       A IV Location/Drains/Wounds Patient Lines/Drains/Airways Status  Active Line/Drains/Airways     Name Placement date Placement time Site Days   Peripheral IV 12/21/22 20 G Anterior;Distal;Left;Upper Arm 12/21/22  2158  Arm  1   Airway 06/03/22  1300  -- 202            Intake/Output Last 24 hours No intake or output data in the 24 hours ending 12/22/22 0127  Labs/Imaging Results for orders placed or performed during the hospital encounter of 12/21/22 (from the  past 48 hour(s))  Basic metabolic panel     Status: Abnormal   Collection Time: 12/21/22  7:22 PM  Result Value Ref Range   Sodium 138 135 - 145 mmol/L   Potassium 3.9 3.5 - 5.1 mmol/L   Chloride 105 98 - 111 mmol/L   CO2 18 (L) 22 - 32 mmol/L   Glucose, Bld 140 (H) 70 - 99 mg/dL    Comment: Glucose reference range applies only to samples taken after fasting for at least 8 hours.   BUN 24 (H) 8 - 23 mg/dL   Creatinine, Ser 1.88 (H) 0.61 - 1.24 mg/dL   Calcium 8.5 (L) 8.9 - 10.3 mg/dL   GFR, Estimated 55 (L) >60 mL/min    Comment: (NOTE) Calculated using the CKD-EPI Creatinine Equation (2021)    Anion gap 15 5 - 15    Comment: Performed at [facility] Lab, [address] [place], [place] [address]  CBC     Status: Abnormal   Collection Time: 12/21/22  7:22 PM  Result Value Ref Range   WBC 9.4 4.0 - 10.5 K/uL   RBC 3.42 (L) 4.22 - 5.81 MIL/uL   Hemoglobin 9.8 (L) 13.0 - 17.0 g/dL   HCT 63.0 (L) 16.0 - 10.9 %   MCV 101.5 (H) 80.0 - 100.0 fL   MCH 28.7 26.0 - 34.0 pg   MCHC 28.2 (L) 30.0 - 36.0 g/dL   RDW 32.3 (H) 55.7 - 32.2 %   Platelets 527 (H) 150 - 400 K/uL   nRBC 0.2 0.0 - 0.2 %    Comment: Performed at [facility] Lab, [address] [place], [place] [address]  Troponin I (High Sensitivity)     Status: Abnormal   Collection Time: 12/21/22  7:22 PM  Result Value Ref Range   Troponin I (High Sensitivity) 62 (H) <18 ng/L    Comment: (NOTE) Elevated high sensitivity troponin I (hsTnI) values and significant  changes across serial measurements may suggest ACS but many other  chronic and acute conditions are known to elevate hsTnI results.  Refer to the "Links" section for chest pain algorithms and additional  guidance. Performed at [facility] Lab, [address], [place], [place] [address]   Protime-INR (order if Patient is taking Coumadin / Warfarin)     Status: Abnormal   Collection Time: 12/21/22  7:22 PM  Result Value Ref Range   Prothrombin Time 17.8  (H) 11.4 - 15.2 seconds   INR 1.4 (H) 0.8 - 1.2    Comment: (NOTE) INR goal varies based on device and disease states. Performed at [facility] Lab, [address] [place], [place] [address]   Brain natriuretic peptide     Status: Abnormal   Collection Time: 12/21/22  7:22 PM  Result Value Ref Range   B Natriuretic Peptide 889.4 (H) 0.0 - 100.0 pg/mL    Comment: Performed at [facility] Lab, [address], [place], [place] [address]  Troponin I (High Sensitivity)  Status: Abnormal   Collection Time: 12/21/22 10:01 PM  Result Value Ref Range   Troponin I (High Sensitivity) 69 (H) <18 ng/L    Comment: (NOTE) Elevated high sensitivity troponin I (hsTnI) values and significant  changes across serial measurements may suggest ACS but many other  chronic and acute conditions are known to elevate hsTnI results.  Refer to the "Links" section for chest pain algorithms and additional  guidance. Performed at [facility] Lab, [address], [place], [place] [address]   Magnesium     Status: None   Collection Time: 12/21/22 10:01 PM  Result Value Ref Range   Magnesium 1.8 1.7 - 2.4 mg/dL    Comment: Performed at [facility] Lab, [address] [place], [place] [address]  I-Stat CG4 Lactic Acid     Status: None   Collection Time: 12/21/22 11:24 PM  Result Value Ref Range   Lactic Acid, Venous 1.3 0.5 - 1.9 mmol/L   *Note: Due to a large number of results and/or encounters for the requested time period, some results have not been displayed. A complete set of results can be found in Results Review.   DG Chest 2 View  Result Date: 12/21/2022 CLINICAL DATA:  Shortness of breath, chest tightness EXAM: CHEST - 2 VIEW COMPARISON:  06/03/2022 FINDINGS: Left AICD remains in place, unchanged. Mild cardiomegaly, vascular congestion. Bibasilar atelectasis and possible small effusions. No overt edema or acute bony abnormality. IMPRESSION: Cardiomegaly, vascular congestion. Bibasilar  atelectasis.  Question small bilateral effusions. Electronically Signed   By: Charlett Nose M.D.   On: 12/21/2022 20:41    Pending Labs Unresulted Labs (From admission, onward)     Start     Ordered   12/22/22 0052  Iron and TIBC  Add-on,   AD        12/22/22 0051   12/22/22 0052  Ferritin  Add-on,   AD        12/22/22 0051   12/22/22 0052  Reticulocytes  Add-on,   AD        12/22/22 0051   Signed and Held  Hemoglobin A1c  Once,   R       Comments: To assess prior glycemic control    Signed and Held   Signed and Held  Comprehensive metabolic panel  Tomorrow morning,   R        Signed and Held   Signed and Held  Magnesium  Tomorrow morning,   R        Signed and Held            Vitals/Pain Today's Vitals   12/21/22 1912 12/21/22 1914 12/21/22 2145 12/21/22 2338  BP: 108/65  106/60   Pulse: (!) 106  98   Resp: 19  17   Temp: 97.9 F (36.6 C)   97.9 F (36.6 C)  TempSrc: Oral   Oral  SpO2: 94%  93%   PainSc:  0-No pain      Isolation Precautions No active isolations  Medications Medications  sodium bicarbonate tablet 650 mg (has no administration in time range)  furosemide (LASIX) 120 mg in dextrose 5 % 50 mL IVPB (has no administration in time range)  hydrocerin (EUCERIN) cream (has no administration in time range)  furosemide (LASIX) injection 80 mg (80 mg Intravenous Given 12/21/22 2225)    Mobility walks     Focused Assessments Cardiac Assessment Handoff:    Lab Results  Component Value Date   TROPONINI  0.03 (HH) 05/27/2016   Lab Results  Component Value Date   DDIMER 1.04 (H) 02/03/2022   Does the Patient currently have chest pain? No    R Recommendations: See Admitting Provider Note  Report given to:   Additional Notes: n/a

## 2022-12-22 NOTE — TOC Initial Note (Signed)
Transition of Care [facility]) - Initial/Assessment Note    Patient Details  Name: William Villanueva MRN: [id number] Date of Birth: 02-25-1951  Transition of Care [facility]) CM/SW Contact:    Leone Haven, RN Phone Number: 12/22/2022, 2:56 PM  Clinical Narrative:                 From home with spouse, has PCP and insurance on file, states has no HH services in place at this time , he has cane, walker and a scooter at home.   States wife will transport him home at [employer] and she  is support system, states gets medications from [facility] on [address] walks with walker.    Expected Discharge Plan: Home/Self Care Barriers to Discharge: Continued Medical Work up   Patient Goals and CMS Choice Patient states their goals for this hospitalization and ongoing recovery are:: return home   Choice offered to / list presented to : NA      Expected Discharge Plan and Services In-house Referral: NA Discharge Planning Services: CM Consult Post Acute Care Choice: NA Living arrangements for the past 2 months: Single Family Home                 DME Arranged: N/A DME Agency: NA       HH Arranged: NA          Prior Living Arrangements/Services Living arrangements for the past 2 months: Single Family Home Lives with:: Spouse Patient language and need for interpreter reviewed:: Yes Do you feel safe going back to the place where you live?: Yes      Need for Family Participation in Patient Care: Yes (Comment) Care giver support system in place?: Yes (comment) Current home services: DME (cane, walker,scooter) Criminal Activity/Legal Involvement Pertinent to Current Situation/Hospitalization: No - Comment as needed  Activities of Daily Living Home Assistive Devices/Equipment: Cane (specify quad or straight), Walker (specify type) (scooter) ADL Screening (condition at time of admission) Patient's cognitive ability adequate to safely complete daily activities?: Yes Is the patient  deaf or have difficulty hearing?: No Does the patient have difficulty seeing, even when wearing glasses/contacts?: No Does the patient have difficulty concentrating, remembering, or making decisions?: No Patient able to express need for assistance with ADLs?: Yes Does the patient have difficulty dressing or bathing?: No Independently performs ADLs?: Yes (appropriate for developmental age) Does the patient have difficulty walking or climbing stairs?: Yes Weakness of Legs: Both Weakness of Arms/Hands: None  Permission Sought/Granted Permission sought to share information with : Case Manager Permission granted to share information with : Yes, Verbal Permission Granted              Emotional Assessment Appearance:: Appears stated age Attitude/Demeanor/Rapport: Engaged Affect (typically observed): Appropriate Orientation: : Oriented to Self, Oriented to Place, Oriented to  Time, Oriented to Situation   Psych Involvement: No (comment)  Admission diagnosis:  Acute on chronic systolic CHF (congestive heart failure) (HCC) [I50.23] Acute congestive heart failure, unspecified heart failure type [facility]) [I50.9] Patient Active Problem List   Diagnosis Date Noted   Acute on chronic systolic CHF (congestive heart failure) (HCC) 12/21/2022   CKD stage 3a, GFR 45-59 ml/min (HCC) 12/21/2022   Chronic respiratory failure with hypoxia (HCC) - prn 2 L/min home O2 12/21/2022   Bilateral pleural effusion 12/21/2022   Increased frequency of urination 10/09/2022   Nevus of scalp 10/09/2022   Rheumatoid arthritis (HCC) 02/23/2022   High risk medication use 02/23/2022  COPD (chronic obstructive pulmonary disease) (HCC) 02/10/2022   Class 2 obesity 02/08/2022   DM2 (diabetes mellitus, type 2) (HCC) 02/04/2022   Nocturnal hypoxemia 12/30/2021   Non-ischemic cardiomyopathy (HCC) 07/29/2020   Postural dizziness with presyncope 02/18/2020   Ulcerative colitis, acute, unspecified complication (HCC)  02/18/2020   Thrombocytosis 02/12/2020   Sprain of left wrist 08/12/2019   Depression 04/05/2016   Kidney stones 04/05/2016   Osteoarthritis 04/05/2016   OSA (obstructive sleep apnea) 04/05/2016   Bilateral carpal tunnel syndrome 12/02/2015   Chronic combined systolic and diastolic congestive heart failure (HCC) 04/13/2015   Orthostatic hypotension 01/19/2015   Obesity 11/26/2014   Enlarged prostate without lower urinary tract symptoms (luts) 10/29/2014   Essential hypertension 10/29/2014   GERD (gastroesophageal reflux disease) 10/29/2014   Panic attack 10/29/2014   Changing skin lesion 10/16/2014   Renal cyst 09/02/2014   Dyspnea on exertion 06/21/2014   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   Palpitations 06/11/2014   Myofascial pain 05/28/2014   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   Other cardiomyopathies (HCC) 04/29/2014   Chronic pain 02/12/2014   Lumbar radiculopathy 02/09/2014   Thrombocythemia 12/01/2013   Muscular wasting and disuse atrophy 08/15/2013   Degenerative lumbar spinal stenosis 08/06/2013   Lumbar and sacral osteoarthritis 05/19/2013   Congestive dilated cardiomyopathy (HCC) 01/11/2012   Back pain    Hemochromatosis    SOB (shortness of breath)    Gout    Polycythemia vera (HCC) 03/27/2011   PCP:  Tally Joe, MD Pharmacy:   [id number] - [place], [place] - [address] [place] [address] Phone: [phone] Fax: [phone]  [facility] - [place], [place] - [address] [place] [address] Phone: [phone] Fax: [phone]  [facility]) - [place], [place] - [address] [address] [place] [address] [place] [address] Phone: [phone] Fax: [phone]  [facility] [place], [place] - [address] [place] [address] Phone:  [phone] Fax: [phone]  [facility] - [place], [place] - [address] [place] [address] Phone: [phone] Fax: [phone]  [facility] [place] [facility] [address] [place] [address] Phone: [phone] Fax: [phone]  [place] [facility] [address] [place] [address] Phone: [phone] Fax: [phone]     Social Determinants of Health (SDOH) Social History: SDOH Screenings   Food Insecurity: No Food Insecurity (12/22/2022)  Housing: Low Risk  (12/22/2022)  Transportation Needs: No Transportation Needs (12/22/2022)  Utilities: Not At Risk (12/22/2022)  Alcohol Screen: Low Risk  (02/06/2022)  Financial Resource Strain: Low Risk  (02/06/2022)  Social Connections: Unknown (09/15/2021)   Received from [facility]  Tobacco Use: Medium Risk (12/22/2022)   SDOH Interventions:     Readmission Risk Interventions     No data to display

## 2022-12-22 NOTE — Progress Notes (Signed)
  Echocardiogram 2D Echocardiogram has been performed.  Delcie Roch 12/22/2022, 4:45 PM

## 2022-12-22 NOTE — Progress Notes (Signed)
Rounding Note    Patient Name: William Villanueva Date of Encounter: 12/22/2022  [facility] Cardiologist: Olga Millers, MD   Subjective   NAOE. No respiratory distress  Interval hx Continuing IV diuresis  Inpatient Medications    Scheduled Meds:  allopurinol  300 mg Oral Daily   budesonide  2 mL Nebulization BID   carvedilol  3.125 mg Oral BID WC   citalopram  5 mg Oral q AM   dicyclomine  20 mg Oral BID   furosemide  80 mg Intravenous Q12H   heparin  5,000 Units Subcutaneous Q8H   hydrocerin   Topical BID   hydroxyurea  1,000 mg Oral Daily   insulin aspart  0-15 Units Subcutaneous TID WC   insulin aspart  0-5 Units Subcutaneous QHS   insulin glargine-yfgn  20 Units Subcutaneous Daily   magnesium gluconate  500 mg Oral BID   oxybutynin  15 mg Oral q AM   pantoprazole  40 mg Oral BID   potassium chloride  20 mEq Oral BID   rosuvastatin  5 mg Oral q AM   sodium bicarbonate  650 mg Oral BID   sodium chloride flush  3 mL Intravenous Q12H   spironolactone  12.5 mg Oral Daily   tamsulosin  0.4 mg Oral Daily   Continuous Infusions:  sodium chloride     PRN Meds: sodium chloride, acetaminophen, ondansetron (ZOFRAN) IV, sodium chloride flush   Vital Signs    Vitals:   12/22/22 0314 12/22/22 0340 12/22/22 0747 12/22/22 0852  BP: 111/67  119/71   Pulse: 99 91    Resp: 20 20 18    Temp: 97.6 F (36.4 C)  98 F (36.7 C)   TempSrc: Oral  Oral   SpO2:  97% 96% 96%  Weight: 110.9 kg     Height: 5\' 8"  (1.727 m)       Intake/Output Summary (Last 24 hours) at 12/22/2022 1031 Last data filed at 12/22/2022 0745 Gross per 24 hour  Intake 303 ml  Output 1100 ml  Net -797 ml      12/22/2022    3:14 AM 11/10/2022    8:11 AM 10/17/2022    9:55 AM  Last 3 Weights  Weight (lbs) 244 lb 7.8 oz 234 lb 232 lb  Weight (kg) 110.9 kg 106.142 kg 105.235 kg      Telemetry    A sensed V paced - Personally Reviewed  ECG    NA - Personally Reviewed  Physical  Exam   Vitals:   12/22/22 0747 12/22/22 0852  BP: 119/71   Pulse:    Resp: 18   Temp: 98 F (36.7 C)   SpO2: 96% 96%    GEN: No acute distress.   Neck: No sig JVD Cardiac: RRR, no murmurs, rubs, or gallops.  Respiratory: Clear to auscultation bilaterally. GI: Soft, distended  MS: No edema; No deformity.1+ edema in the ankles Neuro:  Nonfocal  Psych: Normal affect   Labs    High Sensitivity Troponin:   Recent Labs  Lab 12/21/22 1922 12/21/22 2201  TROPONINIHS 62* 69*     Chemistry Recent Labs  Lab 12/21/22 1922 12/21/22 2201 12/22/22 0820  NA 138  --  134*  K 3.9  --  3.9  CL 105  --  101  CO2 18*  --  21*  GLUCOSE 140*  --  103*  BUN 24*  --  25*  CREATININE 1.37*  --  1.48*  CALCIUM 8.5*  --  8.3*  MG  --  1.8 1.6*  PROT  --   --  6.5  ALBUMIN  --   --  3.1*  AST  --   --  16  ALT  --   --  11  ALKPHOS  --   --  62  BILITOT  --   --  0.8  GFRNONAA 55*  --  50*  ANIONGAP 15  --  12    Lipids  Recent Labs  Lab 12/22/22 0820  CHOL 62  TRIG 51  HDL 24*  LDLCALC 28  CHOLHDL 2.6    Hematology Recent Labs  Lab 12/21/22 1922 12/22/22 0820  WBC 9.4  --   RBC 3.42* 3.47*  HGB 9.8*  --   HCT 34.7*  --   MCV 101.5*  --   MCH 28.7  --   MCHC 28.2*  --   RDW 18.6*  --   PLT 527*  --    Thyroid No results for input(s): "TSH", "FREET4" in the last 168 hours.  BNP Recent Labs  Lab 12/21/22 1922  BNP 889.4*    DDimer No results for input(s): "DDIMER" in the last 168 hours.   Radiology    DG Chest 2 View  Result Date: 12/21/2022 CLINICAL DATA:  Shortness of breath, chest tightness EXAM: CHEST - 2 VIEW COMPARISON:  06/03/2022 FINDINGS: Left AICD remains in place, unchanged. Mild cardiomegaly, vascular congestion. Bibasilar atelectasis and possible small effusions. No overt edema or acute bony abnormality. IMPRESSION: Cardiomegaly, vascular congestion. Bibasilar atelectasis.  Question small bilateral effusions. Electronically Signed   By: Charlett Nose M.D.   On: 12/21/2022 20:41    Cardiac Studies   TTE 02/04/2022  1. Left ventricular ejection fraction, by estimation, is 40%. The left  ventricle has moderately decreased function. The left ventricle  demonstrates global hypokinesis. The left ventricular internal cavity size  was severely dilated. Left ventricular  diastolic parameters are consistent with Grade II diastolic dysfunction  (pseudonormalization). Elevated left atrial pressure. The E/e' is 19.8.   2. Right ventricular systolic function is normal. The right ventricular  size is mildly enlarged. There is mildly elevated pulmonary artery  systolic pressure.   3. Left atrial size was severely dilated.   4. Right atrial size was mildly dilated.   5. The mitral valve is grossly normal. Mild mitral valve regurgitation.   6. The aortic valve is tricuspid. There is mild thickening of the aortic  valve. Aortic valve regurgitation is not visualized. Aortic valve  sclerosis is present, with no evidence of aortic valve stenosis.   7. The inferior vena cava is dilated in size with >50% respiratory  variability, suggesting right atrial pressure of 8 mmHg.   Patient Profile     William Villanueva is a 72 y.o. 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 Charm Barges.  Assessment & Plan    #Acute on Chronic HFrEF Exacerbation (EF = 40%) s/p St Jude CRT-D #High Degree AVB ::Volume overloaded on exam and BNP elevated. Unclear what the trigger for his decompensation is at this time.  UP 5 kg since June - continue IV lasix 80 mg BID - bladder scan if urine output is low - Bi-V pacing < 90% in June, if challenges  with diuresis, can plan to optimize Bi-V pacing with a rep -continue home coreg -continue  spironolactone 12.5 mg daily -not on entresto due to hypotension -not on ACEI/ARB due to orthostasis -not on SGLT2 due to risk of GU infections -strict I/Os, daily weights -TTE pending    #HTN #Non-Obstructive CAD -continue crestor -lipid panel -Lp(a)   #Elevated Troponins Demand ischemia ::Flat troponins without associated ECG changes. Represents myocardial injury in the setting of decompensated CHF. No concern for ACS at this time.     For questions or updates, please contact [facility] Please consult [email or web address] for contact info under        Signed, Maisie Fus, MD  12/22/2022, 10:31 AM

## 2022-12-23 ENCOUNTER — Inpatient Hospital Stay (HOSPITAL_COMMUNITY): Payer: PPO

## 2022-12-23 DIAGNOSIS — I5023 Acute on chronic systolic (congestive) heart failure: Secondary | ICD-10-CM | POA: Diagnosis not present

## 2022-12-23 LAB — GLUCOSE, CAPILLARY
Glucose-Capillary: 100 mg/dL — ABNORMAL HIGH (ref 70–99)
Glucose-Capillary: 83 mg/dL (ref 70–99)
Glucose-Capillary: 95 mg/dL (ref 70–99)
Glucose-Capillary: 111 mg/dL — ABNORMAL HIGH (ref 70–99)

## 2022-12-23 LAB — CBC
HCT: 32.6 % — ABNORMAL LOW (ref 39.0–52.0)
Hemoglobin: 9.8 g/dL — ABNORMAL LOW (ref 13.0–17.0)
MCH: 29.8 pg (ref 26.0–34.0)
MCHC: 30.1 g/dL (ref 30.0–36.0)
MCV: 99.1 fL (ref 80.0–100.0)
Platelets: 582 10*3/uL — ABNORMAL HIGH (ref 150–400)
RBC: 3.29 MIL/uL — ABNORMAL LOW (ref 4.22–5.81)
RDW: 18.9 % — ABNORMAL HIGH (ref 11.5–15.5)
WBC: 10.4 10*3/uL (ref 4.0–10.5)
nRBC: 0 % (ref 0.0–0.2)

## 2022-12-23 LAB — BASIC METABOLIC PANEL
Anion gap: 13 (ref 5–15)
BUN: 30 mg/dL — ABNORMAL HIGH (ref 8–23)
CO2: 22 mmol/L (ref 22–32)
Calcium: 8.8 mg/dL — ABNORMAL LOW (ref 8.9–10.3)
Chloride: 101 mmol/L (ref 98–111)
Creatinine, Ser: 1.53 mg/dL — ABNORMAL HIGH (ref 0.61–1.24)
GFR, Estimated: 48 mL/min — ABNORMAL LOW (ref >60)
Glucose, Bld: 93 mg/dL (ref 70–99)
Potassium: 4.5 mmol/L (ref 3.5–5.1)
Sodium: 136 mmol/L (ref 135–145)

## 2022-12-23 MED ORDER — MAGNESIUM SULFATE 2 GM/50ML IV SOLN
2.0000 g | Freq: Once | INTRAVENOUS | Status: AC
  Administered 2022-12-23: 2 g via INTRAVENOUS
  Filled 2022-12-23: qty 50

## 2022-12-23 NOTE — Progress Notes (Signed)
Progress Note   Patient: William Villanueva JXB:[id number] DOB: 26-Jun-1950 DOA: 12/21/2022     1 DOS: the patient was seen and examined on 12/23/2022    Subjective:  Patient seen and examined Plan of care discussed with cardiologist as well as patient's wife He complains of being weak with associated loss of appetite Denies nausea vomiting abdominal pain or worsening respiratory function   Brief hospital course: From HPI "72 year old male with a history of chronic systolic heart failure EF of 40%, history of VT status post permanent pacemaker, type 2 diabetes, morbid obesity, COPD, CKD stage IIIa, polycythemia vera, OSA, BPH, presented to the ER today with a 3-week history of worsening shortness of breath.  Patient took 2 days worth of 80 mg of Lasix at home only for 2 days last week.  He normally is on 40 mg a day of Lasix.  Has had increasing shortness of breath.  Only mild swelling in his legs.  Called his doctor who told him to go to the ER for evaluation.  He thinks his weight is up but he does not know how by how much.  His medications is managed by his wife.  Patient is unfamiliar with his medication history or his diagnoses.  Poor medical literacy.   On arrival to the ER, temp 97.9 heart rate 106 blood pressure 108/65 satting 94% on room air.  "     Assessment and Plan:  Acute on chronic systolic CHF (congestive heart failure) (HCC) Patient with significant BNP elevation Cardiologist on board and case discussed Continue Lasix therapy; IV Lasix 80 mg twice daily Follow-up on echocardiogram Continue to monitor input and output as well as daily weights Has reported allergy to Entresto(hypotension in 2022).    Chronic respiratory failure with hypoxia (HCC) - prn 2 L/min home O2 Continue supplemental oxygen   CKD stage 3a, GFR 45-59 ml/min (HCC) Baseline Scr 1.25-1.46. monitor Scr during diuresis.  Added po bicarb due to metabolic acidosis. Continue to monitor renal function  closely   COPD (chronic obstructive pulmonary disease) (HCC) Stable.   Continue with pulmicort and dounebs.   DM2 (diabetes mellitus, type 2) (HCC) Continue to hold tresiba.  Continue Lantus Continue carb controlled diet   OSA (obstructive sleep apnea) Continue CPAP at night   Thrombocythemia Continue hydroxyurea   Obesity Counseled on weight loss when medically stable   GERD (gastroesophageal reflux disease) Continue PPI therapy   Essential hypertension Continue current antihypertensives   Enlarged prostate without lower urinary tract symptoms (luts) Stable.   Pacemaker-CRT Chronic.   Polycythemia vera (HCC) Continue hydroxyurea, folic acid.   Bilateral pleural effusion Small on the right. Estimate only 250 ml, trace on the left. Not large enough to warrant thoracentesis.     DVT prophylaxis: Continue SQ Heparin   Code Status: Full Code     Disposition Plan: return home        Physical Exam:  General: Appears acutely ill not in any distress HENT: Normocephalic atraumatic Eyes:     General: No scleral icterus. Cardiovascular:     Rate and Rhythm: Normal rate and regular rhythm.     Pulses: Normal pulses.     Heart sounds: Normal heart sounds.  Pulmonary: Bilateral basal crackles Abdominal:     General: Abdomen is protuberant. Bowel sounds are normal. There is no distension.     Palpations: Abdomen is soft.  Musculoskeletal: Bilateral lower extremity pitting edema Skin:    General: Skin is warm and dry.  Capillary Refill: Capillary refill takes less than 2 seconds.  Neurological:     General: No focal deficit present.     Mental Status: He is alert and oriented to person, place, and time.       Data Reviewed: I have reviewed patient's CBC as well as BMP results as shown below, I have also reviewed patient's telemetry reading, I have reviewed cardiology documentation    Latest Ref Rng & Units 12/23/2022    1:37 AM 12/21/2022    7:22 PM  11/10/2022    7:50 AM  CBC  WBC 4.0 - 10.5 K/uL 10.4  9.4  8.2   Hemoglobin 13.0 - 17.0 g/dL 9.8  9.8  40.9   Hematocrit 39.0 - 52.0 % 32.6  34.7  37.9   Platelets 150 - 400 K/uL 582  527  494        Latest Ref Rng & Units 12/23/2022    1:37 AM 12/22/2022   10:58 AM 12/22/2022    8:20 AM  BMP  Glucose 70 - 99 mg/dL 93  811  914   BUN 8 - 23 mg/dL 30  25  25    Creatinine 0.61 - 1.24 mg/dL 7.82  9.56  2.13   Sodium 135 - 145 mmol/L 136  136  134   Potassium 3.5 - 5.1 mmol/L 4.5  4.1  3.9   Chloride 98 - 111 mmol/L 101  103  101   CO2 22 - 32 mmol/L 22  22  21    Calcium 8.9 - 10.3 mg/dL 8.8  8.5  8.3        Vitals:   12/23/22 0539 12/23/22 0727 12/23/22 0812 12/23/22 1127  BP:   93/64 118/77  Pulse:  97    Resp:  18 16 16   Temp:   (!) 97.5 F (36.4 C) 98.4 F (36.9 C)  TempSrc:   Oral Oral  SpO2:  95% 93% 96%  Weight: 108.8 kg     Height:         Author: Loyce Dys, MD 12/23/2022 3:01 PM  For on call review [email or web address].

## 2022-12-23 NOTE — Progress Notes (Signed)
Rounding Note    Patient Name: William Villanueva Date of Encounter: 12/23/2022  [facility] Cardiologist: Olga Millers, MD   Subjective   No CP; dyspnea improved but persists  Inpatient Medications    Scheduled Meds:  allopurinol  300 mg Oral Daily   budesonide  2 mL Nebulization BID   carvedilol  3.125 mg Oral BID WC   citalopram  5 mg Oral q AM   dicyclomine  20 mg Oral BID   furosemide  80 mg Intravenous Q12H   heparin  5,000 Units Subcutaneous Q8H   hydrocerin   Topical BID   hydroxyurea  1,000 mg Oral Daily   insulin aspart  0-15 Units Subcutaneous TID WC   insulin aspart  0-5 Units Subcutaneous QHS   insulin glargine-yfgn  20 Units Subcutaneous Daily   magnesium gluconate  500 mg Oral BID   oxybutynin  15 mg Oral q AM   pantoprazole  40 mg Oral BID   potassium chloride  20 mEq Oral BID   rosuvastatin  5 mg Oral q AM   sodium bicarbonate  650 mg Oral BID   sodium chloride flush  3 mL Intravenous Q12H   spironolactone  12.5 mg Oral Daily   tamsulosin  0.4 mg Oral Daily   Continuous Infusions:  sodium chloride     magnesium sulfate bolus IVPB     PRN Meds: sodium chloride, acetaminophen, ondansetron (ZOFRAN) IV, sodium chloride flush   Vital Signs    Vitals:   12/23/22 0333 12/23/22 0539 12/23/22 0727 12/23/22 0812  BP: 109/81   93/64  Pulse: 96  97   Resp: 20  18 16   Temp: (!) 97.5 F (36.4 C)   (!) 97.5 F (36.4 C)  TempSrc: Oral   Oral  SpO2: 97%  95% 93%  Weight:  108.8 kg    Height:        Intake/Output Summary (Last 24 hours) at 12/23/2022 0917 Last data filed at 12/23/2022 0818 Gross per 24 hour  Intake 240 ml  Output 500 ml  Net -260 ml      12/23/2022    5:39 AM 12/22/2022    3:14 AM 11/10/2022    8:11 AM  Last 3 Weights  Weight (lbs) 239 lb 12.8 oz 244 lb 7.8 oz 234 lb  Weight (kg) 108.773 kg 110.9 kg 106.142 kg      Telemetry    Sinus with Vpacing and NSVT - Personally Reviewed   Physical Exam   GEN: No acute  distress.   Neck: supple Cardiac: RRR Respiratory: Clear to auscultation bilaterally. GI: Soft, nontender, non-distended  MS: trace edema Neuro:  Nonfocal  Psych: Normal affect   Labs    High Sensitivity Troponin:   Recent Labs  Lab 12/21/22 1922 12/21/22 2201  TROPONINIHS 62* 69*     Chemistry Recent Labs  Lab 12/21/22 2201 12/22/22 0820 12/22/22 1058 12/23/22 0137  NA  --  134* 136 136  K  --  3.9 4.1 4.5  CL  --  101 103 101  CO2  --  21* 22 22  GLUCOSE  --  103* 107* 93  BUN  --  25* 25* 30*  CREATININE  --  1.48* 1.46* 1.53*  CALCIUM  --  8.3* 8.5* 8.8*  MG 1.8 1.6*  --   --   PROT  --  6.5  --   --   ALBUMIN  --  3.1*  --   --  AST  --  16  --   --   ALT  --  11  --   --   ALKPHOS  --  62  --   --   BILITOT  --  0.8  --   --   GFRNONAA  --  50* 51* 48*  ANIONGAP  --  12 11 13     Lipids  Recent Labs  Lab 12/22/22 0820  CHOL 62  TRIG 51  HDL 24*  LDLCALC 28  CHOLHDL 2.6    Hematology Recent Labs  Lab 12/21/22 1922 12/22/22 0820 12/23/22 0137  WBC 9.4  --  10.4  RBC 3.42* 3.47* 3.29*  HGB 9.8*  --  9.8*  HCT 34.7*  --  32.6*  MCV 101.5*  --  99.1  MCH 28.7  --  29.8  MCHC 28.2*  --  30.1  RDW 18.6*  --  18.9*  PLT 527*  --  582*    BNP Recent Labs  Lab 12/21/22 1922  BNP 889.4*      Radiology    ECHOCARDIOGRAM COMPLETE  Result Date: 12/22/2022    ECHOCARDIOGRAM REPORT   Patient Name:   William Villanueva Date of Exam: 12/22/2022 Medical Rec #:  [id number]        Height:       68.0 in Accession #:    [id number]       Weight:       244.5 lb Date of Birth:  1951-02-21         BSA:          2.226 m Patient Age:    72 years         BP:           110/73 mmHg Patient Gender: M                HR:           90 bpm. Exam Location:  Inpatient Procedure: 2D Echo, Cardiac Doppler and Color Doppler Indications:    acute systolic chf  History:        Patient has prior history of Echocardiogram examinations, most                 recent 02/04/2022.  Cardiomyopathy, Pacemaker, COPD and chronic                 kidney disease; Risk Factors:Diabetes, Hypertension and Sleep                 Apnea.  Sonographer:    Delcie Roch RDCS Referring Phys: [id number] ERIC CHEN IMPRESSIONS  1. LV systolic function is severely depressed with diffuse hypokinesis; inferior, septal and apical akinesis. COmpared to echo from Sept 2023, LVEF is worse . Left ventricular ejection fraction, by estimation, is <20%. The left ventricle has severely decreased function. The left ventricle demonstrates global hypokinesis. The left ventricular internal cavity size was severely dilated.  2. Right ventricular systolic function is severely reduced. The right ventricular size is normal. There is moderately elevated pulmonary artery systolic pressure.  3. Left atrial size was moderately dilated.  4. Right atrial size was moderately dilated.  5. Trivial mitral valve regurgitation.  6. The aortic valve is normal in structure. Aortic valve regurgitation is not visualized.  7. The inferior vena cava is dilated in size with <50% respiratory variability, suggesting right atrial pressure of 15 mmHg. FINDINGS  Left Ventricle: LV systolic function is severely depressed with diffuse hypokinesis; inferior,  septal and apical akinesis. COmpared to echo from Sept 2023, LVEF is worse. Left ventricular ejection fraction, by estimation, is <20%. The left ventricle has  severely decreased function. The left ventricle demonstrates global hypokinesis. Definity contrast agent was given IV to delineate the left ventricular endocardial borders. The left ventricular internal cavity size was severely dilated. There is no left  ventricular hypertrophy. Right Ventricle: The right ventricular size is normal. Right vetricular wall thickness was not assessed. Right ventricular systolic function is severely reduced. There is moderately elevated pulmonary artery systolic pressure. The tricuspid regurgitant velocity is 2.90 m/s,  and with an assumed right atrial pressure of 15 mmHg, the estimated right ventricular systolic pressure is 48.6 mmHg. Left Atrium: Left atrial size was moderately dilated. Right Atrium: Right atrial size was moderately dilated. Pericardium: Trivial pericardial effusion is present. Mitral Valve: There is mild thickening of the mitral valve leaflet(s). Mild mitral annular calcification. Trivial mitral valve regurgitation. Tricuspid Valve: The tricuspid valve is normal in structure. Tricuspid valve regurgitation is mild. Aortic Valve: The aortic valve is normal in structure. Aortic valve regurgitation is not visualized. Pulmonic Valve: The pulmonic valve was normal in structure. Pulmonic valve regurgitation is not visualized. Aorta: The aortic root and ascending aorta are structurally normal, with no evidence of dilitation. Venous: The inferior vena cava is dilated in size with less than 50% respiratory variability, suggesting right atrial pressure of 15 mmHg. IAS/Shunts: No atrial level shunt detected by color flow Doppler. Additional Comments: A device lead is visualized.  LEFT VENTRICLE PLAX 2D LVIDd:         6.60 cm   Diastology LVIDs:         6.40 cm   LV e' medial:  5.55 cm/s LV PW:         0.80 cm   LV e' lateral: 5.11 cm/s LV IVS:        0.70 cm LVOT diam:     2.10 cm LV SV:         35 LV SV Index:   16 LVOT Area:     3.46 cm  RIGHT VENTRICLE            IVC RV S prime:     9.36 cm/s  IVC diam: 3.00 cm TAPSE (M-mode): 1.5 cm LEFT ATRIUM              Index        RIGHT ATRIUM           Index LA diam:        4.70 cm  2.11 cm/m   RA Area:     24.80 cm LA Vol (A2C):   80.8 ml  36.30 ml/m  RA Volume:   85.80 ml  38.55 ml/m LA Vol (A4C):   125.0 ml 56.16 ml/m LA Biplane Vol: 105.0 ml 47.17 ml/m  AORTIC VALVE LVOT Vmax:   57.40 cm/s LVOT Vmean:  41.300 cm/s LVOT VTI:    0.100 m  AORTA Ao Root diam: 3.20 cm Ao Asc diam:  3.20 cm TRICUSPID VALVE TR Peak grad:   33.6 mmHg TR Vmax:        290.00 cm/s  SHUNTS  Systemic VTI:  0.10 m Systemic Diam: 2.10 cm Dietrich Pates MD Electronically signed by Dietrich Pates MD Signature Date/Time: 12/22/2022/5:01:04 PM    Final    DG Chest 2 View  Result Date: 12/21/2022 CLINICAL DATA:  Shortness of breath, chest tightness EXAM: CHEST - 2 VIEW COMPARISON:  06/03/2022  FINDINGS: Left AICD remains in place, unchanged. Mild cardiomegaly, vascular congestion. Bibasilar atelectasis and possible small effusions. No overt edema or acute bony abnormality. IMPRESSION: Cardiomegaly, vascular congestion. Bibasilar atelectasis.  Question small bilateral effusions. Electronically Signed   By: Charlett Nose M.D.   On: 12/21/2022 20:41      Patient Profile     William Villanueva is a 72 y.o. 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 Charm Barges.   Echocardiogram this admission shows ejection fraction less than 20%, severe left ventricular enlargement, moderate biatrial enlargement, moderate pulmonary hypertension.  Assessment & Plan    1 acute on chronic systolic congestive heart failure-I/O-1297.  Remains volume overloaded.  Continue Lasix at present dose as well as spironolactone.  Follow renal function.  He is not on an SGLT2 inhibitor given use of Hydrea, size of pannus and risk of infection.  2 nonischemic cardiomyopathy-LV function worse compared to previous.  Patient did have a cardiac catheterization in 2013 showing mild nonobstructive coronary disease (ejection fraction at that time 35 to 40%).  Cardiac MRI showed ejection fraction of 34% with no hyperenhancement, scar or evidence of hemochromatosis.  Plan medical therapy.  Will continue low-dose carvedilol.  His losartan was discontinued previously due to orthostasis/falling.  He did not tolerate  Entresto in the past.  3 CRT-P-per Dr. Graciela Husbands.  4 history of nonobstructive coronary artery disease-continue statin.  He has not had chest pain.  5 chronic stage IIIa kidney disease-follow renal function closely with diuresis.  6 polycythemia vera  For questions or updates, please contact [facility] Please consult [email or web address] for contact info under        Signed, Olga Millers, MD  12/23/2022, 9:17 AM

## 2022-12-24 DIAGNOSIS — I5023 Acute on chronic systolic (congestive) heart failure: Secondary | ICD-10-CM | POA: Diagnosis not present

## 2022-12-24 LAB — BASIC METABOLIC PANEL
Anion gap: 13 (ref 5–15)
BUN: 36 mg/dL — ABNORMAL HIGH (ref 8–23)
CO2: 22 mmol/L (ref 22–32)
Calcium: 8.6 mg/dL — ABNORMAL LOW (ref 8.9–10.3)
Chloride: 100 mmol/L (ref 98–111)
Creatinine, Ser: 1.86 mg/dL — ABNORMAL HIGH (ref 0.61–1.24)
GFR, Estimated: 38 mL/min — ABNORMAL LOW (ref >60)
Glucose, Bld: 90 mg/dL (ref 70–99)
Potassium: 4.3 mmol/L (ref 3.5–5.1)
Sodium: 135 mmol/L (ref 135–145)

## 2022-12-24 LAB — GLUCOSE, CAPILLARY
Glucose-Capillary: 78 mg/dL (ref 70–99)
Glucose-Capillary: 147 mg/dL — ABNORMAL HIGH (ref 70–99)

## 2022-12-24 LAB — CBC
HCT: 31.6 % — ABNORMAL LOW (ref 39.0–52.0)
Hemoglobin: 9.4 g/dL — ABNORMAL LOW (ref 13.0–17.0)
MCH: 28.7 pg (ref 26.0–34.0)
MCHC: 29.7 g/dL — ABNORMAL LOW (ref 30.0–36.0)
MCV: 96.3 fL (ref 80.0–100.0)
Platelets: 534 10*3/uL — ABNORMAL HIGH (ref 150–400)
RBC: 3.28 MIL/uL — ABNORMAL LOW (ref 4.22–5.81)
RDW: 18.9 % — ABNORMAL HIGH (ref 11.5–15.5)
WBC: 10.3 10*3/uL (ref 4.0–10.5)
nRBC: 0.2 % (ref 0.0–0.2)

## 2022-12-24 LAB — MAGNESIUM: Magnesium: 1.9 mg/dL (ref 1.7–2.4)

## 2022-12-24 MED ORDER — TORSEMIDE 20 MG PO TABS
20.0000 mg | ORAL_TABLET | Freq: Two times a day (BID) | ORAL | Status: DC
  Administered 2022-12-24: 20 mg via ORAL
  Filled 2022-12-24: qty 1

## 2022-12-24 MED ORDER — ACETAMINOPHEN 325 MG PO TABS: 650.0000 mg | ORAL_TABLET | Freq: Four times a day (QID) | ORAL | Status: DC | PRN

## 2022-12-24 MED ORDER — SODIUM BICARBONATE 650 MG PO TABS: 650.0000 mg | ORAL_TABLET | Freq: Two times a day (BID) | ORAL | 0 refills | Status: DC

## 2022-12-24 MED ORDER — TORSEMIDE 20 MG PO TABS: 20.0000 mg | ORAL_TABLET | Freq: Two times a day (BID) | ORAL | 2 refills | Status: DC

## 2022-12-24 NOTE — Plan of Care (Signed)
  Problem: Coping: Goal: Ability to adjust to condition or change in health will improve Outcome: Adequate for Discharge   

## 2022-12-24 NOTE — Progress Notes (Signed)
Rounding Note    Patient Name: Mosheh Eymard Date of Encounter: 12/24/2022  [facility] Cardiologist: Olga Millers, MD   Subjective   No CP; dyspnea improving  Inpatient Medications    Scheduled Meds:  allopurinol  300 mg Oral Daily   budesonide  2 mL Nebulization BID   carvedilol  3.125 mg Oral BID WC   citalopram  5 mg Oral q AM   dicyclomine  20 mg Oral BID   furosemide  80 mg Intravenous Q12H   heparin  5,000 Units Subcutaneous Q8H   hydrocerin   Topical BID   hydroxyurea  1,000 mg Oral Daily   insulin aspart  0-15 Units Subcutaneous TID WC   insulin aspart  0-5 Units Subcutaneous QHS   insulin glargine-yfgn  20 Units Subcutaneous Daily   magnesium gluconate  500 mg Oral BID   oxybutynin  15 mg Oral q AM   pantoprazole  40 mg Oral BID   potassium chloride  20 mEq Oral BID   rosuvastatin  5 mg Oral q AM   sodium bicarbonate  650 mg Oral BID   sodium chloride flush  3 mL Intravenous Q12H   spironolactone  12.5 mg Oral Daily   tamsulosin  0.4 mg Oral Daily   Continuous Infusions:  sodium chloride     PRN Meds: sodium chloride, acetaminophen, ondansetron (ZOFRAN) IV, sodium chloride flush   Vital Signs    Vitals:   12/23/22 2325 12/24/22 0338 12/24/22 0441 12/24/22 0756  BP: 102/65 93/63  102/67  Pulse: 67 70  90  Resp: 20 17  18   Temp: 98 F (36.7 C) 97.8 F (36.6 C)  (!) 97.4 F (36.3 C)  TempSrc: Oral Oral  Oral  SpO2: 96% 96%  91%  Weight:   107.2 kg   Height:        Intake/Output Summary (Last 24 hours) at 12/24/2022 0803 Last data filed at 12/23/2022 2143 Gross per 24 hour  Intake 773 ml  Output 1100 ml  Net -327 ml      12/24/2022    4:41 AM 12/23/2022    5:39 AM 12/22/2022    3:14 AM  Last 3 Weights  Weight (lbs) 236 lb 5.3 oz 239 lb 12.8 oz 244 lb 7.8 oz  Weight (kg) 107.2 kg 108.773 kg 110.9 kg      Telemetry    Sinus with Vpacing and NSVT - Personally Reviewed   Physical Exam   GEN: NAD, obese Neck: JVP  difficult to assess Cardiac: RRR, no rub Respiratory: CTA GI: Soft, NT, ND MS: no edema Neuro:  Grossly intact Psych: Normal affect   Labs    High Sensitivity Troponin:   Recent Labs  Lab 12/21/22 1922 12/21/22 2201  TROPONINIHS 62* 69*     Chemistry Recent Labs  Lab 12/21/22 2201 12/22/22 0820 12/22/22 1058 12/23/22 0137 12/24/22 0230  NA  --  134* 136 136 135  K  --  3.9 4.1 4.5 4.3  CL  --  101 103 101 100  CO2  --  21* 22 22 22   GLUCOSE  --  103* 107* 93 90  BUN  --  25* 25* 30* 36*  CREATININE  --  1.48* 1.46* 1.53* 1.86*  CALCIUM  --  8.3* 8.5* 8.8* 8.6*  MG 1.8 1.6*  --   --  1.9  PROT  --  6.5  --   --   --   ALBUMIN  --  3.1*  --   --   --  AST  --  16  --   --   --   ALT  --  11  --   --   --   ALKPHOS  --  62  --   --   --   BILITOT  --  0.8  --   --   --   GFRNONAA  --  50* 51* 48* 38*  ANIONGAP  --  12 11 13 13     Lipids  Recent Labs  Lab 12/22/22 0820  CHOL 62  TRIG 51  HDL 24*  LDLCALC 28  CHOLHDL 2.6    Hematology Recent Labs  Lab 12/21/22 1922 12/22/22 0820 12/23/22 0137 12/24/22 0230  WBC 9.4  --  10.4 10.3  RBC 3.42* 3.47* 3.29* 3.28*  HGB 9.8*  --  9.8* 9.4*  HCT 34.7*  --  32.6* 31.6*  MCV 101.5*  --  99.1 96.3  MCH 28.7  --  29.8 28.7  MCHC 28.2*  --  30.1 29.7*  RDW 18.6*  --  18.9* 18.9*  PLT 527*  --  582* 534*    BNP Recent Labs  Lab 12/21/22 1922  BNP 889.4*      Radiology    DG Hand Complete Left  Result Date: 12/23/2022 CLINICAL DATA:  Pain EXAM: LEFT HAND - COMPLETE 3+ VIEW COMPARISON:  None Available. FINDINGS: There is no evidence of fracture or dislocation. None mild degenerative changes are noted predominantly involving the DIP joints, basilar joint and radiocarpal joint. Soft tissues are unremarkable. IMPRESSION: 1. No acute findings. 2. Mild osteoarthritis. Electronically Signed   By: Signa Kell M.D.   On: 12/23/2022 11:50   ECHOCARDIOGRAM COMPLETE  Result Date: 12/22/2022    ECHOCARDIOGRAM  REPORT   Patient Name:   BLAISE FALANGA Date of Exam: 12/22/2022 Medical Rec #:  [id number]        Height:       68.0 in Accession #:    [id number]       Weight:       244.5 lb Date of Birth:  January 14, 1951         BSA:          2.226 m Patient Age:    72 years         BP:           110/73 mmHg Patient Gender: M                HR:           90 bpm. Exam Location:  Inpatient Procedure: 2D Echo, Cardiac Doppler and Color Doppler Indications:    acute systolic chf  History:        Patient has prior history of Echocardiogram examinations, most                 recent 02/04/2022. Cardiomyopathy, Pacemaker, COPD and chronic                 kidney disease; Risk Factors:Diabetes, Hypertension and Sleep                 Apnea.  Sonographer:    Delcie Roch RDCS Referring Phys: [id number] ERIC CHEN IMPRESSIONS  1. LV systolic function is severely depressed with diffuse hypokinesis; inferior, septal and apical akinesis. COmpared to echo from Sept 2023, LVEF is worse . Left ventricular ejection fraction, by estimation, is <20%. The left ventricle has severely decreased function. The left ventricle demonstrates global hypokinesis. The left ventricular  internal cavity size was severely dilated.  2. Right ventricular systolic function is severely reduced. The right ventricular size is normal. There is moderately elevated pulmonary artery systolic pressure.  3. Left atrial size was moderately dilated.  4. Right atrial size was moderately dilated.  5. Trivial mitral valve regurgitation.  6. The aortic valve is normal in structure. Aortic valve regurgitation is not visualized.  7. The inferior vena cava is dilated in size with <50% respiratory variability, suggesting right atrial pressure of 15 mmHg. FINDINGS  Left Ventricle: LV systolic function is severely depressed with diffuse hypokinesis; inferior, septal and apical akinesis. COmpared to echo from Sept 2023, LVEF is worse. Left ventricular ejection fraction, by estimation, is <20%. The  left ventricle has  severely decreased function. The left ventricle demonstrates global hypokinesis. Definity contrast agent was given IV to delineate the left ventricular endocardial borders. The left ventricular internal cavity size was severely dilated. There is no left  ventricular hypertrophy. Right Ventricle: The right ventricular size is normal. Right vetricular wall thickness was not assessed. Right ventricular systolic function is severely reduced. There is moderately elevated pulmonary artery systolic pressure. The tricuspid regurgitant velocity is 2.90 m/s, and with an assumed right atrial pressure of 15 mmHg, the estimated right ventricular systolic pressure is 48.6 mmHg. Left Atrium: Left atrial size was moderately dilated. Right Atrium: Right atrial size was moderately dilated. Pericardium: Trivial pericardial effusion is present. Mitral Valve: There is mild thickening of the mitral valve leaflet(s). Mild mitral annular calcification. Trivial mitral valve regurgitation. Tricuspid Valve: The tricuspid valve is normal in structure. Tricuspid valve regurgitation is mild. Aortic Valve: The aortic valve is normal in structure. Aortic valve regurgitation is not visualized. Pulmonic Valve: The pulmonic valve was normal in structure. Pulmonic valve regurgitation is not visualized. Aorta: The aortic root and ascending aorta are structurally normal, with no evidence of dilitation. Venous: The inferior vena cava is dilated in size with less than 50% respiratory variability, suggesting right atrial pressure of 15 mmHg. IAS/Shunts: No atrial level shunt detected by color flow Doppler. Additional Comments: A device lead is visualized.  LEFT VENTRICLE PLAX 2D LVIDd:         6.60 cm   Diastology LVIDs:         6.40 cm   LV e' medial:  5.55 cm/s LV PW:         0.80 cm   LV e' lateral: 5.11 cm/s LV IVS:        0.70 cm LVOT diam:     2.10 cm LV SV:         35 LV SV Index:   16 LVOT Area:     3.46 cm  RIGHT VENTRICLE             IVC RV S prime:     9.36 cm/s  IVC diam: 3.00 cm TAPSE (M-mode): 1.5 cm LEFT ATRIUM              Index        RIGHT ATRIUM           Index LA diam:        4.70 cm  2.11 cm/m   RA Area:     24.80 cm LA Vol (A2C):   80.8 ml  36.30 ml/m  RA Volume:   85.80 ml  38.55 ml/m LA Vol (A4C):   125.0 ml 56.16 ml/m LA Biplane Vol: 105.0 ml 47.17 ml/m  AORTIC VALVE LVOT Vmax:   57.40  cm/s LVOT Vmean:  41.300 cm/s LVOT VTI:    0.100 m  AORTA Ao Root diam: 3.20 cm Ao Asc diam:  3.20 cm TRICUSPID VALVE TR Peak grad:   33.6 mmHg TR Vmax:        290.00 cm/s  SHUNTS Systemic VTI:  0.10 m Systemic Diam: 2.10 cm Dietrich Pates MD Electronically signed by Dietrich Pates MD Signature Date/Time: 12/22/2022/5:01:04 PM    Final       Patient Profile     Sashank Rousselle is a 72 y.o. 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 Charm Barges.   Echocardiogram this admission shows ejection fraction less than 20%, severe left ventricular enlargement, moderate biatrial enlargement, moderate pulmonary hypertension.  Assessment & Plan    1 acute on chronic systolic congestive heart failure-I/O-1624 since admission.  Symptomatically much improved compared to admission.  Discontinue IV Lasix and treat with Demadex 20 mg twice daily with an additional 20 mg daily for weight gain of 2 to 3 pounds.  Continue spironolactone.   He is not on an SGLT2 inhibitor given use of Hydrea, size of pannus and risk of infection.  2 nonischemic cardiomyopathy-LV function worse compared to previous.  Patient did have a cardiac catheterization in 2013 showing mild nonobstructive coronary disease (ejection fraction at that time 35 to 40%).  Cardiac MRI showed ejection fraction of 34% with no hyperenhancement, scar or  evidence of hemochromatosis.  Plan medical therapy.  Will continue low-dose carvedilol.  His losartan was discontinued previously due to orthostasis/falling.  He did not tolerate Entresto in the past.  3 CRT-P-per Dr. Graciela Husbands.  4 history of nonobstructive coronary artery disease-continue statin.  He has not had chest pain.  5 chronic stage IIIa kidney disease-follow renal function as an outpatient.  6 polycythemia vera  Patient can be discharged today from a cardiac standpoint.  His Lasix was discontinued and he will be treated with Demadex as outlined above. Check bmet one week; will arrange fu with APP 2 weeks.  For questions or updates, please contact [facility] Please consult [email or web address] for contact info under        Signed, Olga Millers, MD  12/24/2022, 8:03 AM

## 2022-12-24 NOTE — Discharge Summary (Signed)
Physician Discharge Summary   Patient: William Villanueva MRN: [id number] DOB: Sep 01, 1950  Admit date:     12/21/2022  Discharge date: 12/24/22  Discharge Physician: Loyce Dys   PCP: Tally Joe, MD   Recommendations at discharge:   Discharge Diagnoses:   Acute on chronic systolic CHF (congestive heart failure) (HCC) Chronic respiratory failure with hypoxia (HCC) - prn 2 L/min home O2 CKD stage 3a, GFR 45-59 ml/min (HCC) COPD (chronic obstructive pulmonary disease) (HCC) DM2 (diabetes mellitus, type 2) (HCC) OSA (obstructive sleep apnea) Thrombocythemia Obesity GERD (gastroesophageal reflux disease) Essential hypertension Enlarged prostate without lower urinary tract symptoms (luts)  Pacemaker-CRT Polycythemia vera (HCC) Bilateral pleural effusion   Hospital Course: 72 year old male with a history of chronic systolic heart failure EF of 40%, history of VT status post permanent pacemaker, type 2 diabetes, morbid obesity, COPD, CKD stage IIIa, polycythemia vera, OSA, BPH, presented to the ER today with a 3-week history of worsening shortness of breath. Patient took 2 days worth of 80 mg of Lasix at home only for 2 days last week. He normally is on 40 mg a day of Lasix. Has had increasing shortness of breath. Only mild swelling in his legs. Called his doctor who told him to go to the ER for evaluation. He thinks his weight is up but he does not know how by how much. His medications is managed by his wife. Patient is unfamiliar with his medication history or his diagnoses. Poor medical literacy.  Cardiologist recommended discontinuation of Lasix and initiation of Demadex 20 mg twice daily with additional 20 mg daily for weight gain of 2 to 3 pounds.  We will continue spironolactone and carvedilol.  Patient is not on SGLT2 inhibitor given use of hydroxyurea, patient did not tolerate Entresto in the past.   Consultants: Cardiology Procedures performed: None Disposition: Home Diet  recommendation:  Cardiac diet DISCHARGE MEDICATION: Allergies as of 12/24/2022       Reactions   Advil [ibuprofen] Itching   Keflex [cephalexin] Diarrhea, Other (See Comments)   Caused C-diff, also   Wellbutrin [bupropion] Hives, Other (See Comments)   Prednisone Itching, Other (See Comments)   Abdominal pain, also   Temazepam Other (See Comments)   Dizziness    Trazodone And Nefazodone Other (See Comments)   Dizziness   Sacubitril-valsartan Other (See Comments)   Prozac [fluoxetine Hcl] Itching        Medication List     STOP taking these medications    cholestyramine 4 GM/DOSE powder Commonly known as: QUESTRAN   colchicine 0.6 MG tablet   diclofenac Sodium 1 % Gel Commonly known as: Voltaren   furosemide 40 MG tablet Commonly known as: LASIX   ketoconazole 2 % cream Commonly known as: NIZORAL   zolpidem 5 MG tablet Commonly known as: AMBIEN       TAKE these medications    acetaminophen 500 MG tablet Commonly known as: TYLENOL Take 1,000 mg by mouth every 6 (six) hours as needed for headache.   allopurinol 300 MG tablet Commonly known as: ZYLOPRIM TAKE 1 TABLET BY MOUTH DAILY   B-12 PO Take 1 tablet by mouth daily.   budesonide 0.5 MG/2ML nebulizer solution Commonly known as: PULMICORT Take 2 mLs (0.5 mg total) by nebulization 2 (two) times daily. (RINSE MOUTH AFTER USE)   carvedilol 3.125 MG tablet Commonly known as: COREG Take 1 tablet (3.125 mg total) by mouth 2 (two) times daily with a meal.   citalopram 10 MG tablet Commonly known  as: CELEXA Take 5 mg by mouth in the morning.   dicyclomine 20 MG tablet Commonly known as: BENTYL Take 20 mg by mouth 2 (two) times daily.   folic acid 1 MG tablet Commonly known as: FOLVITE Take 1 mg by mouth in the morning.   hydroxyurea 500 MG capsule Commonly known as: HYDREA TAKE TWO CAPSULES BY MOUTH DAILY   ipratropium-albuterol 0.5-2.5 (3) MG/3ML Soln Commonly known as: DUONEB Inhale one  vial in nebulizer twice daily and in between if needed   magnesium gluconate 500 MG tablet Commonly known as: MAGONATE Take 500 mg by mouth in the morning and at bedtime.   omeprazole 20 MG capsule Commonly known as: PRILOSEC Take 20 mg by mouth in the morning and at bedtime.   oxybutynin 15 MG 24 hr tablet Commonly known as: DITROPAN XL Take 15 mg by mouth in the morning.   prednisoLONE acetate 1 % ophthalmic suspension Commonly known as: PRED FORTE Place 1 drop into both eyes daily as needed (eye irritation.).   QC Tumeric Complex 500 MG Caps Generic drug: Turmeric Take 1 tablet by mouth daily.   rosuvastatin 5 MG tablet Commonly known as: CRESTOR Take 5 mg by mouth in the morning.   sodium bicarbonate 650 MG tablet Take 1 tablet (650 mg total) by mouth 2 (two) times daily.   spironolactone 25 MG tablet Commonly known as: ALDACTONE Take 0.5 tablets (12.5 mg total) by mouth daily.   tamsulosin 0.4 MG Caps capsule Commonly known as: FLOMAX Take 0.4 mg by mouth daily.   torsemide 20 MG tablet Commonly known as: DEMADEX Take 1 tablet (20 mg total) by mouth 2 (two) times daily. Demadex 20 mg twice daily with an additional 20 mg daily for weight gain of 2 to 3 pounds.   Evaristo Bury FlexTouch 100 UNIT/ML FlexTouch Pen Generic drug: insulin degludec Inject 20 Units into the skin daily after breakfast.   triamcinolone cream 0.1 % Commonly known as: KENALOG Apply 1 Application topically 2 (two) times daily.        Discharge Exam: Filed Weights   12/22/22 0314 12/23/22 0539 12/24/22 0441  Weight: 110.9 kg 108.8 kg 107.2 kg   General: Appears acutely ill not in any distress HENT: Normocephalic atraumatic Eyes:     General: No scleral icterus. Cardiovascular:     Rate and Rhythm: Normal rate and regular rhythm.     Pulses: Normal pulses.     Heart sounds: Normal heart sounds.  Pulmonary: Clear to auscultation bilaterally Abdominal:     General: Abdomen is  protuberant. Bowel sounds are normal. There is no distension.     Palpations: Abdomen is soft.  Musculoskeletal: Bilateral lower extremity pitting edema Skin:    General: Skin is warm and dry.     Capillary Refill: Capillary refill takes less than 2 seconds.  Neurological:     General: No focal deficit present.     Mental Status: He is alert and oriented to person, place, and time.     Condition at discharge: good   Discharge time spent:  35 minutes.  Signed: Loyce Dys, MD [facility] 12/24/2022

## 2022-12-25 NOTE — Telephone Encounter (Signed)
Patient has been seen in the ER 

## 2022-12-26 ENCOUNTER — Telehealth: Payer: Self-pay | Admitting: Cardiology

## 2022-12-26 ENCOUNTER — Ambulatory Visit: Payer: PPO | Attending: Student | Admitting: Student

## 2022-12-26 ENCOUNTER — Encounter: Payer: Self-pay | Admitting: Student

## 2022-12-26 VITALS — BP 98/56 | HR 88 | Ht 68.0 in | Wt 241.4 lb

## 2022-12-26 DIAGNOSIS — I5042 Chronic combined systolic (congestive) and diastolic (congestive) heart failure: Secondary | ICD-10-CM | POA: Diagnosis not present

## 2022-12-26 DIAGNOSIS — Z79899 Other long term (current) drug therapy: Secondary | ICD-10-CM | POA: Diagnosis not present

## 2022-12-26 DIAGNOSIS — I428 Other cardiomyopathies: Secondary | ICD-10-CM | POA: Diagnosis not present

## 2022-12-26 LAB — LIPOPROTEIN A (LPA): Lipoprotein (a): 39.3 nmol/L — ABNORMAL HIGH (ref <75.0)

## 2022-12-26 MED ORDER — TORSEMIDE 40 MG PO TABS: 20.0000 mg | ORAL_TABLET | Freq: Two times a day (BID) | ORAL | 3 refills | Status: DC

## 2022-12-26 MED ORDER — CARVEDILOL 3.125 MG PO TABS: 3.1250 mg | ORAL_TABLET | Freq: Two times a day (BID) | ORAL | 2 refills | Status: DC

## 2022-12-26 NOTE — Telephone Encounter (Signed)
Spoke with pt wife, she reports the patient is up 5 lbs overnight. She has given him extra torsemide with no relief. He has increase SOB. Patient scheduled to see NP this afternoon.

## 2022-12-26 NOTE — Telephone Encounter (Signed)
Patient to ED and still not feeling well.  Wants to know the net step.  Should set him up for APP visit??

## 2022-12-26 NOTE — Telephone Encounter (Signed)
Pt's spouse is requesting a callback from nurse regarding pt being released from ED recently but still not feeling well and she's concerned on what she should do. Please advise

## 2022-12-26 NOTE — Progress Notes (Signed)
Cardiology Clinic Note   Date: 12/26/2022 ID: William Villanueva, DOB 01-08-1951, MRN [id number]  Primary Cardiologist:  Olga Millers, MD  Patient Profile    William Villanueva is a 72 y.o. male who presents to the clinic today for increased weight and shortness of breath.     Past medical history significant for: Nonobstructive CAD. R/LHC 01/15/2012: Proximal to mid LAD 20%.  Proximal to mid LCx 20%.  OM2 20%. 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 PPM generator change out to ICD, lead insertion 04/28/2022. Remote device check 10/31/2022: Normal device function. Battery status good. Lead measurements unchanged.  Echo 12/22/2022: EF <20%.  Diffuse hypokinesis.  Inferior, septal and apical akinesis.  Severely reduced RV function.  Moderately elevated PA pressure.  Moderate BAE. Heart block. Hypertension. Hyperlipidemia. CKD stage IIIa. T2DM. GERD. COPD. OSA.     History of Present Illness    William Villanueva is a longtime patient of cardiology followed by Dr. Jens Som and Dr. Graciela Husbands for the above outlined history.  In summary, patient was first evaluated by Dr. Jens Som in August 2013 with complaints of chest pain.  Echo showed EF 35 to 40%.  Cardiac catheterization September 2013 showed mild nonobstructive CAD with no hemodynamic evidence of restriction.  Cardiac MRI September 2013 showed EF 34% with diffuse hypokinesis, no hyperenhancement or scar tissue and no evidence of cardiac hemochromatosis.  Patient was admitted December 2015 with high degree AV block.  Subsequently had CRT-P placed.  Nuclear study March 2017 showed EF 32%, prior infarct but no ischemia with medical treatment as felt likely attenuation artifact.  Chest CT August 2023 showed small bilateral pleural effusions, pulmonary artery enlargement, emphysema, coronary atherosclerosis.  Echo September 2023 showed EF 40%, Grade II DD, mild  RVH, severe LAE, mild RAE, mild MR.  Patient's device upgraded to CRT-D December 2023 after symptomatic high degree AV block noted.  Patient was last seen in the office by Dr. Jens Som on 08/18/2022.  He had been having problems with falling and difficulty with orthostatic symptoms.  Losartan was discontinued by Dr. Graciela Husbands.  He denied exertional chest pain.  He was euvolemic at the time of his visit.  No other changes were made.  His last office visit with EP was with Otilio Saber, PA-C on 10/17/2022.  He was doing well at that time and no changes were made.  Patient presented to the ED on 12/21/2022 for increasing shortness of breath and chest tightness worsened with exertion. He also reported 2 week history of dry cough. He reported a 20 lb weight gain over a week with lower extremity edema, orthopnea and PND. Troponin 62>>69. BNP 889.4. Chest xray showed volume overload. EKG showed v-paced rhythm without clear ischemic changes. He received 80 mg IV Lasix. Patient continued on home carvedilol and spironolactone. He was intolerant to Entresto secondary to hypotension, ACE/ARB secondary to orthostasis. Cannot take SGLT2 secondary to risk of GU infections. Patient was transitioned from IV Lasix to torsemide 20 mg bid with additional 20 mg with weight gain. Patient was discharged on 12/24/2022.   Patient's wife contacted the office today, 12/26/2022, reporting patient up 9 lb with increased shortness of breath.  Give extra dose of torsemide.  Today, patient is added on to my schedule as an acute visit.  He is accompanied by his wife.  He complains of continued weakness.  Shortness of breath is essentially unchanged from what brought him to the hospital.  He becomes dyspneic  with walking and moving around.  He reports an 8 pound weight gain overnight.  He did take an extra dose of torsemide this morning for total of 40 mg.  He denies increased orthopnea since discharge from hospital.  He is able to sleep in bed with  the head slightly elevated on 1 pillow.  He does report symptoms of PND described as waking with shortness of breath however he also states "I wake up to go to the bathroom."  His wife denies dietary indiscretion with sodium stating "I hid the saltshaker."  It is unclear if she is adding salt as she cooks.  She reports he is a picky eater and she has a hard time giving him foods he is willing to eat.  He has a history of diarrhea and has had episodes in the last couple of days.  He denies chest pain.    ROS: All other systems reviewed and are otherwise negative except as noted in History of Present Illness.  Studies Reviewed    EKG is not ordered today.      Physical Exam    VS:  BP (!) 98/56 (BP Location: Left Arm, Patient Position: Sitting, Cuff Size: Large) Comment: MANUAL  Pulse 88   Ht 5\' 8"  (1.727 m)   Wt 241 lb 6.4 oz (109.5 kg)   SpO2 92%   BMI 36.70 kg/m  , BMI Body mass index is 36.7 kg/m.  GEN: Well nourished, well developed, in no acute distress. Neck: No JVD or carotid bruits. Cardiac:  RRR. No murmurs. No rubs or gallops.   Respiratory:  Respirations regular and unlabored. Clear to auscultation without rales, wheezing or rhonchi. GI: Soft, nontender, nondistended. Extremities: Radials/DP/PT 2+ and equal bilaterally. No clubbing or cyanosis. No edema.  Skin: Warm and dry, no rash. Neuro: Strength intact.  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  Recent hospital admission on 12/21/2022 to 12/24/2022 for heart failure exacerbation.  Echo August 2024 showed EF <20%, diffuse hypokinesis, inferior/septal/apical akinesis, severely reduced RV function, moderately elevated PA pressure, moderate BAE.  Patient reports 8 pound weight gain overnight and shortness of breath that is unchanged since prior to hospital admission.  Wife feels he is not urinating that much but patient reports frequent urination particularly at  night.  He is observed to be dyspneic with walking.  He denies increased orthopnea sleeping in bed with head raised slightly on 1 pillow.  He describes PND but also states "I wake up to go to the bathroom."  No lower extremity edema on exam today.  Abdomen is soft.  Lungs are clear to auscultation without wheezing, rhonchi, rales.  Case discussed with Dr. Jens Som.  He is in agreement patient does not need hospitalization at this point.  Will increase torsemide to 40 mg twice daily and follow patient closely in a week.  Patient will continue to weigh daily and is provided with a weight log to bring for follow-up.  Discussed sodium restriction and alternatives such as Mrs. Dash for salt free cooking. Continue carvedilol, spironolactone.  Patient intolerant to Entresto secondary to hypotension, ACE/ARB secondary to orthostasis.  No SGLT2 secondary for risk of GU infections.  Will get BMP today to check on kidney function.  Will need repeat BMP on follow-up.   Disposition: BMP today.  Increase torsemide to 40 mg twice daily.  Follow-up in 1 week or sooner as needed.         Signed, Gavin Pound  Annitta Needs, DNP, NP-C

## 2022-12-26 NOTE — Patient Instructions (Addendum)
   Medication Instructions:  Start the Carvedilol 3.125 mg. Take this medication twice daily. Increase the Torsemide 40mg  . Take this medication twice daily.  *If you need a refill on your cardiac medications before your next appointment, please call your pharmacy*   Lab Work: [facility] for medication management If you have labs (blood work) drawn today and your tests are completely normal, you will receive your results only by: MyChart Message (if you have MyChart) OR A paper copy in the mail If you have any lab test that is abnormal or we need to change your treatment, we will call you to review the results.   Testing/Procedures: None ordered   Follow-Up: At [facility], you and your health needs are our priority.  As part of our continuing mission to provide you with exceptional heart care, we have created designated Provider Care Teams.  These Care Teams include your primary Cardiologist (physician) and Advanced Practice Providers (APPs -  Physician Assistants and Nurse Practitioners) who all work together to provide you with the care you need, when you need it.  We recommend signing up for the patient portal called "MyChart".  Sign up information is provided on this After Visit Summary.  MyChart is used to connect with patients for Virtual Visits (Telemedicine).  Patients are able to view lab/test results, encounter notes, upcoming appointments, etc.  Non-urgent messages can be sent to your provider as well.   To learn more about what you can do with MyChart, go to [email or web address].    Your next appointment:   1 week(s)  Provider:   Available APP        Other Instructions CONTINUE TO TAKE YOUR WEIGHT DAILY. Bring weight log to your next appointment.

## 2022-12-27 LAB — BASIC METABOLIC PANEL
BUN/Creatinine Ratio: 23 (ref 10–24)
BUN: 47 mg/dL — ABNORMAL HIGH (ref 8–27)
CO2: 19 mmol/L — ABNORMAL LOW (ref 20–29)
Calcium: 8.2 mg/dL — ABNORMAL LOW (ref 8.6–10.2)
Chloride: 104 mmol/L (ref 96–106)
Creatinine, Ser: 2.08 mg/dL — ABNORMAL HIGH (ref 0.76–1.27)
Glucose: 93 mg/dL (ref 70–99)
Potassium: 5.4 mmol/L — ABNORMAL HIGH (ref 3.5–5.2)
Sodium: 137 mmol/L (ref 134–144)
eGFR: 33 mL/min/{1.73_m2} — ABNORMAL LOW (ref >59)

## 2022-12-27 NOTE — Addendum Note (Signed)
Addended by: Shon Hale on: 12/27/2022 09:55 AM   Modules accepted: Orders

## 2023-01-01 ENCOUNTER — Ambulatory Visit: Payer: PPO | Attending: Internal Medicine

## 2023-01-01 DIAGNOSIS — Z95810 Presence of automatic (implantable) cardiac defibrillator: Secondary | ICD-10-CM

## 2023-01-01 DIAGNOSIS — I5042 Chronic combined systolic (congestive) and diastolic (congestive) heart failure: Secondary | ICD-10-CM

## 2023-01-02 DIAGNOSIS — Z79899 Other long term (current) drug therapy: Secondary | ICD-10-CM | POA: Diagnosis not present

## 2023-01-02 NOTE — Progress Notes (Unsigned)
Cardiology Office Note   Date:  01/03/2023  ID:  William Villanueva, DOB [birth year], MRN [id number] PCP:  Tally Joe, MD  [facility] Cardiologist: Olga Millers, MD Dr. Berton Mount for EP   Reason for visit: 1 week follow-up  History of Present Illness    William Villanueva is a 72 y.o. male with a hx of patient was first evaluated by Dr. Jens Som in August 2013 with complaints of chest pain. Echo showed EF 35 to 40%. Cardiac catheterization September 2013 showed mild nonobstructive CAD with no hemodynamic evidence of restriction. Cardiac MRI September 2013 showed EF 34% with diffuse hypokinesis, no hyperenhancement or scar tissue and no evidence of cardiac hemochromatosis. Patient was admitted December 2015 with high degree AV block. Subsequently had CRT-P placed. Nuclear study March 2017 showed EF 32%, prior infarct but no ischemia with medical treatment as felt likely attenuation artifact. Chest CT August 2023 showed small bilateral pleural effusions, pulmonary artery enlargement, emphysema, coronary atherosclerosis. Echo September 2023 showed EF 40%, Grade II DD, mild RVH, severe LAE, mild RAE, mild MR. Patient's device upgraded to CRT-D December 2023 after symptomatic high degree AV block noted.   He presented to the ED on 12/21/2022 for increasing shortness of breath and chest tightness worsened with exertion. He also reported 2 week history of dry cough. He reported a 20 lb weight gain over a week with lower extremity edema, orthopnea and PND.  He was diuresed and transition to torsemide 20 mg twice daily on discharge 12/24/2022.  To note patient is intolerant to Entresto secondary to hypotension, ACE/ARB secondary to orthostasis. Cannot take SGLT2 secondary to risk of GU infections.   Patient was seen by Carlyon Shadow DNP on December 26, 2022.  Patient had 8-9 pound weight gain with increased shortness of breath.  Case discussed with Dr. Jens Som.  Patient's torsemide was increased  to 40 mg twice daily with follow-up in 1 week.  Today, patient and his wife state pt is not doing well.  They do state after increasing torsemide to 40mg  twice daily, his weight has gone down but not back to baseline yet.  He states his typical weight is 225 to 230 pounds.  He was 234 pounds at home this morning.  He states now he only has a little bit of swelling.  He feels short of breath with mild activity, even going to the car.  He has significant weakness and fatigue.  His wife states he sleeps all day.  He does not eat much or have an appetite.  He gets phlegm in his throat particularly with lying down.  He has not slept flat in a long time.  He denies PND, chest pain, palpitations, lightheadedness and syncope.  Patient complains of frequent urination at night.  He states he takes his second dose of torsemide at 7 PM.   Objective / Physical Exam   Vital signs:  BP (!) 112/58   Pulse 73   Ht 5\' 8"  (1.727 m)   Wt 234 lb (106.1 kg)   SpO2 95%   BMI 35.58 kg/m     GEN: Slightly dyspneic at rest NECK: No carotid bruits CARDIAC: RRR, no murmurs RESPIRATORY:  Clear to auscultation without rales, wheezing or rhonchi  EXTREMITIES: minimal trace edema, warm  Assessment and Plan   Chronic combined systolic and diastolic heart failure, near euvolemic Nonischemic cardiomyopathy -Echo 2013 with EF 35-40%, LHC 2013 with mild CAD; Cardiac MRI  2013 showed EF 34% with diffuse hypokinesis, no hyperenhancement or  scar tissue and no evidence of cardiac hemochromatosis.  -CRT-P 2015, upgraded to CRT-D in 2023 after symptomatic high degree AV block noted.  -Echo during CHF admit in August 2024: EF <20%, diffuse hypokinesis, inferior/septal/apical akinesis, severely reduced RV function, moderately elevated PA pressure, moderate BAE.  -Appears to be near euvolemic on exam.   -Continue torsemide 40 mg twice daily, spironolactone 12.5 mg daily and carvedilol 3.125 mg twice daily. -To note patient is  intolerant to Entresto secondary to hypotension, ACE/ARB secondary to orthostasis. Cannot take SGLT2 secondary to risk of GU infections.  -He does not feel cool to me on exam. -I think he is at class IV/stage D heart failure with shortness of breath with mild exertion, extreme fatigue, weakness and lack of appetite.  His treatment is limited by blood pressure and kidney dysfunction.  Recommend left and right heart catheterization for further evaluation and referral to heart failure clinic.   Informed Consent   Shared Decision Making/Informed Consent The risks [stroke (1 in 1000), death (1 in 1000), kidney failure [usually temporary] (1 in 500), bleeding (1 in 200), allergic reaction [possibly serious] (1 in 200)], benefits (diagnostic support and management of coronary artery disease) and alternatives of a cardiac catheterization were discussed in detail with Mr. Hinzman and he is willing to proceed.       Disposition - Schedule R/LHC and referral to HF clinic.   Signed, Cannon Kettle, PA-C  01/03/2023 [facility]

## 2023-01-03 ENCOUNTER — Ambulatory Visit: Payer: PPO | Attending: Physician Assistant | Admitting: Physician Assistant

## 2023-01-03 VITALS — BP 112/58 | HR 73 | Ht 68.0 in | Wt 234.0 lb

## 2023-01-03 DIAGNOSIS — I428 Other cardiomyopathies: Secondary | ICD-10-CM

## 2023-01-03 DIAGNOSIS — I5042 Chronic combined systolic (congestive) and diastolic (congestive) heart failure: Secondary | ICD-10-CM | POA: Diagnosis not present

## 2023-01-03 DIAGNOSIS — I10 Essential (primary) hypertension: Secondary | ICD-10-CM

## 2023-01-03 LAB — BASIC METABOLIC PANEL
BUN/Creatinine Ratio: 30 — ABNORMAL HIGH (ref 10–24)
BUN: 52 mg/dL — ABNORMAL HIGH (ref 8–27)
CO2: 26 mmol/L (ref 20–29)
Calcium: 9.1 mg/dL (ref 8.6–10.2)
Chloride: 100 mmol/L (ref 96–106)
Creatinine, Ser: 1.73 mg/dL — ABNORMAL HIGH (ref 0.76–1.27)
Glucose: 92 mg/dL (ref 70–99)
Potassium: 5.1 mmol/L (ref 3.5–5.2)
Sodium: 139 mmol/L (ref 134–144)
eGFR: 42 mL/min/{1.73_m2} — ABNORMAL LOW (ref >59)

## 2023-01-03 NOTE — Patient Instructions (Addendum)
Medication Instructions:  Take your second dose of torsemide around 3:00 PM *If you need a refill on your cardiac medications before your next appointment, please call your pharmacy*  Lab Work: None ordered If you have labs (blood work) drawn today and your tests are completely normal, you will receive your results only by: MyChart Message (if you have MyChart) OR A paper copy in the mail If you have any lab test that is abnormal or we need to change your treatment, we will call you to review the results.   Testing/Procedures:       Cardiac/Peripheral Catheterization   You are scheduled for a Cardiac Catheterization on Wednesday, September 4 with Dr. Bryan Lemma.  1. Please arrive at the [facility] (Main Entrance A) at [facility]: [address] [place], [place] [address] at 8:00 AM (This time is 2 hour(s) before your procedure to ensure your preparation). Free valet parking service is available. You will check in at ADMITTING. The support person will be asked to wait in the waiting room.  It is OK to have someone drop you off and come back when you are ready to be discharged.        Special note: Every effort is made to have your procedure done on time. Please understand that emergencies sometimes delay scheduled procedures.  2. Diet: Do not eat solid foods after midnight.  You may have clear liquids until 5 AM the day of the procedure.  3. Labs: You do not need to have blood drawn.  4. Medication instructions in preparation for your procedure:   Contrast Allergy: No   Take only 1/2 dose of insulin the night before your procedure. Do not take any insulin on the day of the procedure.  Do not take torsemide or spironolactone on the morning of your procedure.    On the morning of your procedure, take Aspirin 81 mg and any morning medicines NOT listed above.  You may use sips of water.  5. Plan to go home the same day, you will only stay overnight if medically  necessary. 6. You MUST have a responsible adult to drive you home. 7. An adult MUST be with you the first 24 hours after you arrive home. 8. Bring a current list of your medications, and the last time and date medication taken. 9. Bring ID and current insurance cards. 10.Please wear clothes that are easy to get on and off and wear slip-on shoes.  Thank you for allowing [place] to care for you!   -- [facility]    Follow-Up: At [facility], you and your health needs are our priority.  As part of our continuing mission to provide you with exceptional heart care, we have created designated Provider Care Teams.  These Care Teams include your primary Cardiologist (physician) and Advanced Practice Providers (APPs -  Physician Assistants and Nurse Practitioners) who all work together to provide you with the care you need, when you need it.  Your next appointment:   Schedule 1 month follow up appointment with the CHF clinic.

## 2023-01-03 NOTE — Addendum Note (Signed)
Addended by: Valrie Hart on: 01/03/2023 11:56 AM   Modules accepted: Orders

## 2023-01-04 NOTE — Progress Notes (Signed)
EPIC Encounter for ICM Monitoring  Patient Name: William Villanueva is a 72 y.o. male Date: 01/04/2023 Primary Care Physican: Tally Joe, MD Primary Cardiologist: Jens Som Electrophysiologist: Joycelyn Schmid Pacing: 90%            07/17/2022 Weight:  217 lbs 09/20/2022 Weight: 227 lbs 10/24/2022 Weight: 228 lbs 01/04/2023 Weight: 234 lbs    AT/AF Burden <1%                                                            Spoke with patient and heart failure questions reviewed.  Transmission results reviewed.  Pt was hospitalization 8/15-8/18 for Acute CHF.  He feels very fatigued and weak after discharge.  He has a cardiac cath scheduled for next week.                  CorVue thoracic daily impedance normal but was suggesting possible fluid accumulation from 7/30-8/12.   Prescribed:   Torsemide 40 mg Take 0.5 tablet(s) (20 mg total) by mouth twice a day.   Spironolactone 25 mg take 0.5 tablet (12.5 mg total) once a day   Labs: 01/02/2023 Creatinine 1.73, BUN 52, Potassium 5.1, Sodium 139, GFR 42  12/26/2022 Creatinine 2.08, BUN 47, Potassium 5.4, Sodium 137, GFR 33  12/24/2022 Creatinine 1.86, BUN 36, Potassium 4.3, Sodium 135  12/23/2022 Creatinine 1.53, BUN 30, Potassium 4.5, Sodium 136 12/22/2022 Creatinine 1.46, BUN 25, Potassium 4.1, Sodium 136  A complete set of results can be found in Results Review.   Recommendations:  Encouraged to call if experiencing any fluid symptoms.    Follow-up plan: ICM clinic phone appointment on 02/12/2023.   91 day device clinic remote transmission 01/30/2023.     EP/Cardiology Office Visits:   Recall 02/14/2023 with Dr Jens Som.  04/17/2023 with Dr Graciela Husbands.   Copy of ICM check sent to Dr. Graciela Husbands.   3 month ICM trend: 01/01/2023.    12-14 Month ICM trend:     Karie Soda, RN 01/04/2023 1:04 PM

## 2023-01-08 ENCOUNTER — Other Ambulatory Visit: Payer: Self-pay

## 2023-01-08 ENCOUNTER — Emergency Department (HOSPITAL_BASED_OUTPATIENT_CLINIC_OR_DEPARTMENT_OTHER)
Admission: EM | Admit: 2023-01-08 | Discharge: 2023-01-09 | Disposition: A | Discharge status: Home / Self Care | Payer: PPO | Source: Home / Self Care | Attending: Emergency Medicine | Admitting: Emergency Medicine

## 2023-01-08 ENCOUNTER — Emergency Department (HOSPITAL_BASED_OUTPATIENT_CLINIC_OR_DEPARTMENT_OTHER): Payer: PPO

## 2023-01-08 ENCOUNTER — Encounter (HOSPITAL_BASED_OUTPATIENT_CLINIC_OR_DEPARTMENT_OTHER): Payer: Self-pay

## 2023-01-08 DIAGNOSIS — Z950 Presence of cardiac pacemaker: Secondary | ICD-10-CM | POA: Insufficient documentation

## 2023-01-08 DIAGNOSIS — J449 Chronic obstructive pulmonary disease, unspecified: Secondary | ICD-10-CM | POA: Insufficient documentation

## 2023-01-08 DIAGNOSIS — R0989 Other specified symptoms and signs involving the circulatory and respiratory systems: Secondary | ICD-10-CM | POA: Diagnosis not present

## 2023-01-08 DIAGNOSIS — E1122 Type 2 diabetes mellitus with diabetic chronic kidney disease: Secondary | ICD-10-CM | POA: Insufficient documentation

## 2023-01-08 DIAGNOSIS — Z1152 Encounter for screening for COVID-19: Secondary | ICD-10-CM | POA: Insufficient documentation

## 2023-01-08 DIAGNOSIS — S2242XA Multiple fractures of ribs, left side, initial encounter for closed fracture: Secondary | ICD-10-CM | POA: Diagnosis not present

## 2023-01-08 DIAGNOSIS — I509 Heart failure, unspecified: Secondary | ICD-10-CM

## 2023-01-08 DIAGNOSIS — I110 Hypertensive heart disease with heart failure: Secondary | ICD-10-CM | POA: Diagnosis not present

## 2023-01-08 DIAGNOSIS — R918 Other nonspecific abnormal finding of lung field: Secondary | ICD-10-CM | POA: Diagnosis not present

## 2023-01-08 DIAGNOSIS — R0602 Shortness of breath: Secondary | ICD-10-CM | POA: Diagnosis not present

## 2023-01-08 DIAGNOSIS — I5043 Acute on chronic combined systolic (congestive) and diastolic (congestive) heart failure: Secondary | ICD-10-CM

## 2023-01-08 DIAGNOSIS — N189 Chronic kidney disease, unspecified: Secondary | ICD-10-CM | POA: Insufficient documentation

## 2023-01-08 DIAGNOSIS — I130 Hypertensive heart and chronic kidney disease with heart failure and stage 1 through stage 4 chronic kidney disease, or unspecified chronic kidney disease: Secondary | ICD-10-CM | POA: Diagnosis not present

## 2023-01-08 LAB — URINALYSIS, ROUTINE W REFLEX MICROSCOPIC
Bilirubin Urine: NEGATIVE
Glucose, UA: NEGATIVE mg/dL
Hgb urine dipstick: NEGATIVE
Ketones, ur: NEGATIVE mg/dL
Leukocytes,Ua: NEGATIVE
Nitrite: NEGATIVE
Protein, ur: NEGATIVE mg/dL
Specific Gravity, Urine: 1.015 (ref 1.005–1.030)
pH: 7 (ref 5.0–8.0)

## 2023-01-08 LAB — CBC
HCT: 32.4 % — ABNORMAL LOW (ref 39.0–52.0)
Hemoglobin: 9.7 g/dL — ABNORMAL LOW (ref 13.0–17.0)
MCH: 30.1 pg (ref 26.0–34.0)
MCHC: 29.9 g/dL — ABNORMAL LOW (ref 30.0–36.0)
MCV: 100.6 fL — ABNORMAL HIGH (ref 80.0–100.0)
Platelets: 711 10*3/uL — ABNORMAL HIGH (ref 150–400)
RBC: 3.22 MIL/uL — ABNORMAL LOW (ref 4.22–5.81)
RDW: 20.3 % — ABNORMAL HIGH (ref 11.5–15.5)
WBC: 8 10*3/uL (ref 4.0–10.5)
nRBC: 0.3 % — ABNORMAL HIGH (ref 0.0–0.2)

## 2023-01-08 LAB — COMPREHENSIVE METABOLIC PANEL
ALT: 11 U/L (ref 0–44)
AST: 15 U/L (ref 15–41)
Albumin: 3.4 g/dL — ABNORMAL LOW (ref 3.5–5.0)
Alkaline Phosphatase: 69 U/L (ref 38–126)
Anion gap: 10 (ref 5–15)
BUN: 31 mg/dL — ABNORMAL HIGH (ref 8–23)
CO2: 26 mmol/L (ref 22–32)
Calcium: 8.4 mg/dL — ABNORMAL LOW (ref 8.9–10.3)
Chloride: 102 mmol/L (ref 98–111)
Creatinine, Ser: 1.64 mg/dL — ABNORMAL HIGH (ref 0.61–1.24)
GFR, Estimated: 44 mL/min — ABNORMAL LOW (ref >60)
Glucose, Bld: 96 mg/dL (ref 70–99)
Potassium: 4 mmol/L (ref 3.5–5.1)
Sodium: 138 mmol/L (ref 135–145)
Total Bilirubin: 0.8 mg/dL (ref 0.3–1.2)
Total Protein: 7.1 g/dL (ref 6.5–8.1)

## 2023-01-08 LAB — LIPASE, BLOOD: Lipase: 26 U/L (ref 11–51)

## 2023-01-08 LAB — RESP PANEL BY RT-PCR (RSV, FLU A&B, COVID)  RVPGX2
Influenza A by PCR: NEGATIVE
Influenza B by PCR: NEGATIVE
Resp Syncytial Virus by PCR: NEGATIVE
SARS Coronavirus 2 by RT PCR: NEGATIVE

## 2023-01-08 LAB — BRAIN NATRIURETIC PEPTIDE: B Natriuretic Peptide: 1183.7 pg/mL — ABNORMAL HIGH (ref 0.0–100.0)

## 2023-01-08 LAB — LACTIC ACID, PLASMA: Lactic Acid, Venous: 1.5 mmol/L (ref 0.5–1.9)

## 2023-01-08 MED ORDER — ACETAMINOPHEN 500 MG PO TABS
1000.0000 mg | ORAL_TABLET | Freq: Once | ORAL | Status: AC
  Administered 2023-01-08: 1000 mg via ORAL
  Filled 2023-01-08: qty 2

## 2023-01-08 MED ORDER — FUROSEMIDE 10 MG/ML IJ SOLN
80.0000 mg | Freq: Once | INTRAMUSCULAR | Status: AC
  Administered 2023-01-08: 80 mg via INTRAVENOUS
  Filled 2023-01-08: qty 8

## 2023-01-08 NOTE — ED Triage Notes (Addendum)
Pt reports chills, generalized weakness, cough, N/V/D x2 days. Family members with covid.

## 2023-01-08 NOTE — ED Notes (Signed)
Per order attempted to ambulate patient. Patient resting oxygen saturation is 94%/RA. Patient informed me he was dizzy and a fall risk. Physician notified.

## 2023-01-08 NOTE — ED Provider Notes (Signed)
[facility] Provider Note   CSN: [id number] Arrival date & time: 01/08/23  1715     History  Chief Complaint  Patient presents with   Abdominal Pain   Cough   Weakness   Shortness of Breath    William Villanueva is a 72 y.o. male.  72 year old male history of systolic heart failure, V. tach status post pacemaker placement, type 2 diabetes, COPD, CKD, BPH presenting for shortness of breath, myalgias, concern for COVID.  He reports he has multiple COVID exposures at home and wants a COVID test.  He reports is felt short of breath, and had nonproductive cough for few days.  Chills, no fevers.  Has had some nonbloody diarrhea, no vomiting.  Poor p.o. intake.  No lower extremity edema.  No chest pain or pleuritic pain.  He was recently admitted here August 15 for shortness of breath and treated for heart failure exacerbation it appears.  Echo at the time showed severe combined heart failure.  He was seen by cardiology in the office August 20, given continued dyspnea on exertion they scheduled him for left and right heart cath he was increased to 40 mg torsemide twice daily.  He denies any abdominal pain.     Home Medications Prior to Admission medications   Medication Sig Start Date End Date Taking? Authorizing Provider  acetaminophen (TYLENOL) 500 MG tablet Take 1,000 mg by mouth every 6 (six) hours as needed for headache.    [provider]  allopurinol (ZYLOPRIM) 300 MG tablet TAKE 1 TABLET BY MOUTH DAILY 10/24/22   Rice, Jamesetta Orleans, MD  budesonide (PULMICORT) 0.5 MG/2ML nebulizer solution Take 2 mLs (0.5 mg total) by nebulization 2 (two) times daily. (RINSE MOUTH AFTER USE) 09/28/22   Nyoka Cowden, MD  carvedilol (COREG) 3.125 MG tablet Take 1 tablet (3.125 mg total) by mouth 2 (two) times daily with a meal. 12/26/22   Wittenborn, Gavin Pound, NP  cholestyramine Lanetta Inch) 4 GM/DOSE powder Take 4 g by mouth daily as needed (diarrhea).  03/22/22   [provider]  citalopram (CELEXA) 10 MG tablet Take 5 mg by mouth in the morning. 03/01/22   [provider]  Cyanocobalamin (B-12 PO) Take 1 tablet by mouth daily.    [provider]  diclofenac Sodium (VOLTAREN) 1 % GEL Apply 2 g topically daily as needed (pain).    [provider]  dicyclomine (BENTYL) 20 MG tablet Take 20 mg by mouth 2 (two) times daily. 03/28/22   [provider]  hydroxyurea (HYDREA) 500 MG capsule TAKE TWO CAPSULES BY MOUTH DAILY 07/21/22   Josph Macho, MD  insulin degludec (TRESIBA FLEXTOUCH) 100 UNIT/ML SOPN FlexTouch Pen Inject 16 Units into the skin daily after breakfast.    [provider]  ipratropium-albuterol (DUONEB) 0.5-2.5 (3) MG/3ML SOLN Inhale one vial in nebulizer twice daily and in between if needed 05/18/22   Nyoka Cowden, MD  magnesium gluconate (MAGONATE) 500 MG tablet Take 500 mg by mouth in the morning and at bedtime.    [provider]  naphazoline-glycerin (CLEAR EYES REDNESS) 0.012-0.25 % SOLN Place 1-2 drops into both eyes daily as needed for eye irritation.    [provider]  omeprazole (PRILOSEC) 20 MG capsule Take 20 mg by mouth in the morning and at bedtime.    [provider]  oxybutynin (DITROPAN XL) 15 MG 24 hr tablet Take 15 mg by mouth in the morning. 02/06/22  [provider]  sodium bicarbonate 650 MG tablet Take 1 tablet (650 mg total) by mouth 2 (two) times daily. 12/24/22   Loyce Dys, MD  spironolactone (ALDACTONE) 25 MG tablet Take 0.5 tablets (12.5 mg total) by mouth daily. 04/18/22   Lewayne Bunting, MD  tamsulosin (FLOMAX) 0.4 MG CAPS capsule Take 0.4 mg by mouth daily. 08/03/17   [provider]  torsemide 40 MG TABS Take 20 mg by mouth 2 (two) times daily. Patient taking differently: Take 40 mg by mouth 2 (two) times daily. 12/26/22 03/26/23  Carlos Levering, NP  triamcinolone cream (KENALOG) 0.1 % Apply 1  Application topically 2 (two) times daily. Patient taking differently: Apply 1 Application topically daily as needed (Rash). 10/09/22   Louann Sjogren, DPM  Turmeric (QC TUMERIC COMPLEX) 500 MG CAPS Take 1 tablet by mouth daily.    [provider]      Allergies    Advil [ibuprofen], Keflex [cephalexin], Wellbutrin [bupropion], Prednisone, Temazepam, Trazodone and nefazodone, Sacubitril-valsartan, and Prozac [fluoxetine hcl]    Review of Systems   Review of Systems  Respiratory:  Positive for cough and shortness of breath.   Gastrointestinal:  Positive for abdominal pain.  Neurological:  Positive for weakness.  Review of systems completed and notable as per HPI.  ROS otherwise negative.   Physical Exam Updated Vital Signs BP 110/69 (BP Location: Left Arm)   Pulse 79   Temp 98 F (36.7 C) (Oral)   Resp 20   Ht 5\' 8"  (1.727 m)   Wt 106.1 kg   SpO2 95%   BMI 35.58 kg/m  Physical Exam Vitals and nursing note reviewed.  Constitutional:      General: He is not in acute distress.    Appearance: He is well-developed.  HENT:     Head: Normocephalic and atraumatic.     Nose: Nose normal.     Mouth/Throat:     Mouth: Mucous membranes are moist.     Pharynx: Oropharynx is clear.  Eyes:     Extraocular Movements: Extraocular movements intact.     Conjunctiva/sclera: Conjunctivae normal.     Pupils: Pupils are equal, round, and reactive to light.  Cardiovascular:     Rate and Rhythm: Normal rate and regular rhythm.     Heart sounds: No murmur heard. Pulmonary:     Effort: Pulmonary effort is normal. No respiratory distress.     Breath sounds: Normal breath sounds.  Abdominal:     Palpations: Abdomen is soft.     Tenderness: There is no abdominal tenderness. There is no guarding or rebound.  Musculoskeletal:        General: No swelling.     Cervical back: Neck supple.     Right lower leg: No edema.     Left lower leg: No edema.  Skin:    General: Skin is warm and  dry.     Capillary Refill: Capillary refill takes less than 2 seconds.  Neurological:     Mental Status: He is alert.  Psychiatric:        Mood and Affect: Mood normal.     ED Results / Procedures / Treatments   Labs (all labs ordered are listed, but only abnormal results are displayed) Labs Reviewed  COMPREHENSIVE METABOLIC PANEL - Abnormal; Notable for the following components:      Result Value   BUN 31 (*)    Creatinine, Ser 1.64 (*)    Calcium 8.4 (*)  Albumin 3.4 (*)    GFR, Estimated 44 (*)    All other components within normal limits  CBC - Abnormal; Notable for the following components:   RBC 3.22 (*)    Hemoglobin 9.7 (*)    HCT 32.4 (*)    MCV 100.6 (*)    MCHC 29.9 (*)    RDW 20.3 (*)    Platelets 711 (*)    nRBC 0.3 (*)    All other components within normal limits  BRAIN NATRIURETIC PEPTIDE - Abnormal; Notable for the following components:   B Natriuretic Peptide 1,183.7 (*)    All other components within normal limits  RESP PANEL BY RT-PCR (RSV, FLU A&B, COVID)  RVPGX2  LIPASE, BLOOD  URINALYSIS, ROUTINE W REFLEX MICROSCOPIC  LACTIC ACID, PLASMA    EKG None  Radiology DG Chest 2 View  Result Date: 01/08/2023 CLINICAL DATA:  Shortness of breath EXAM: CHEST - 2 VIEW COMPARISON:  12/21/2022 FINDINGS: Mild hyperinflation. Pacer/ICD. Midline trachea. Mild cardiomegaly. Trace bilateral pleural fluid or thickening. Pulmonary venous congestion is slightly increased. No lobar consolidation. Remote lower left rib fractures. IMPRESSION: Cardiomegaly with slight increase in mild pulmonary venous congestion. Probable trace pleural fluid or thickening bilaterally. Electronically Signed   By: Jeronimo Greaves M.D.   On: 01/08/2023 19:00    Procedures Procedures    Medications Ordered in ED Medications  furosemide (LASIX) injection 80 mg (80 mg Intravenous Given 01/08/23 2001)  acetaminophen (TYLENOL) tablet 1,000 mg (1,000 mg Oral Given 01/08/23 2027)    ED Course/  Medical Decision Making/ A&P Clinical Course as of 01/08/23 2320  Mon Jan 08, 2023  1952 Turner: admit hospitalist [JD]    Clinical Course User Index [JD] Laurence Spates, MD                                 Medical Decision Making Amount and/or Complexity of Data Reviewed Labs: ordered. Radiology: ordered.  Risk Prescription drug management. Decision regarding hospitalization.   Medical Decision Making:   Katon Maraldo is a 72 y.o. male who presented to the ED today with cough, myalgias, generalized weakness, shortness of breath.  Triage blood pressure was hypotensive, although on my evaluation he was normotensive.  EKG without evidence of acute ischemia.  On exam he has occasional cough, but clear lungs and normal work of breathing.  Reports recent COVID exposure, consider possible viral infection, pneumonia.  Also consider heart for exacerbation although no lower extremity edema.  Consider PE, however no hypoxia, DVT symptoms, or pleuritic pain.   Additional history discussed with patient's family/caregivers.  Patient placed on continuous vitals and telemetry monitoring while in ED which was reviewed periodically.  Reviewed and confirmed nursing documentation for past medical history, family history, social history.  Reassessment and Plan:   Workup reviewed, chest x-ray with slightly worsened edema compared to prior hospitalization.  His BNP is worsened as well.  Lactic acid is normal, no signs of cardiogenic shock on exam.  CBC notable for normocytosis, no leukocytosis.  BMP with baseline renal function.  COVID and flu are negative.  I performed a bedside ultrasound which shows severe reduced EF as well as dilated IVC consistent with volume overload which is also consistent with his chest x-ray and BNP.  Lasix ordered.  He has been doing everything appropriate outpatient including recent increase in his torsemide without significant improvement.  Scheduled for heart cath on  Wednesday.  Discussed with Dr. Mayford Knife with cardiology who recommends admission for diuresis.  Patient is amenable to staying.  He has received IV Lasix.  Discussed with hospitalist and admitted.  Awaiting transfer.  Patient's presentation is most consistent with acute presentation with potential threat to life or bodily function.           Final Clinical Impression(s) / ED Diagnoses Final diagnoses:  Acute on chronic combined systolic and diastolic congestive heart failure [facility])    Rx / DC Orders ED Discharge Orders     None         Laurence Spates, MD 01/08/23 2320

## 2023-01-09 ENCOUNTER — Telehealth: Payer: Self-pay | Admitting: *Deleted

## 2023-01-09 ENCOUNTER — Encounter: Payer: Self-pay | Admitting: Physician Assistant

## 2023-01-09 ENCOUNTER — Ambulatory Visit (HOSPITAL_BASED_OUTPATIENT_CLINIC_OR_DEPARTMENT_OTHER): Payer: PPO

## 2023-01-09 NOTE — ED Provider Notes (Signed)
I assumed care of the patient at 2300.  Briefly the patient is a 72 year old male who was admitted for likely heart failure.  I was asked by the patient to have a discussion with him about what is going on.  I discussed the plan to admit him to the hospital for an plans likely to be seen by the cardiologist in the morning.  Patient is feeling much better after having what he describes as a gallon of urine output after getting an IV dose of Lasix.  He tells me that he feels better and would like to go home.  He plans to follow-up with a cardiologist on Wednesday as he has a scheduled cardiac catheterization.  Had a discussion of risks and benefits with him about staying.  He is currently declining.   Melene Plan, DO 01/09/23 [phone]

## 2023-01-09 NOTE — H&P (View-Only) (Signed)
With EF <20% & recent ED with volume overload, recommend no pre-procedure hydration.  Recommend taking torsemide & spironolactone today.  Can hold AM of cath & take meds after cath.  We will see if he needs admission after getting cath readings.   Ok to proceed with cath with Hgb 9.7.  No evidence of active bleeding.   Juanda Crumble, PA-C

## 2023-01-09 NOTE — Telephone Encounter (Addendum)
See Documentation Note from today-no pre-procedure hydration.   Cardiac Catheterization scheduled at [facility] for: Wednesday January 10, 2023 10 AM Arrival time [facility] Main Entrance A at: 8 AM  Nothing to eat after midnight prior to procedure, clear liquids until 5 AM day of procedure.  Medication instructions: -Hold:  Torsemide/Spironolactone/Insulin -AM of procedure -Other usual morning medications can be taken with sips of water including aspirin 81 mg.  Okay to take aspirin per Juanda Crumble, PA.  Plan to go home the same day, you will only stay overnight if medically necessary.  You must have responsible adult to drive you home.  Someone must be with you the first 24 hours after you arrive home.  Reviewed procedure instructions with patient's wife at patient's request.  Patient reports he is feeling better than yesterday, but did have rough night. Patient reports he does not feel he needs medical attention at this time, knows to seek medical attention if worsening symptoms.

## 2023-01-09 NOTE — Discharge Instructions (Signed)
Please double your diuretic for the next couple days.  Please call your cardiologist in the morning and let them know that we had recommended that you come in for admission.  Please return for worsening difficulty breathing.

## 2023-01-09 NOTE — Progress Notes (Signed)
With EF <20% & recent ED with volume overload, recommend no pre-procedure hydration.  Recommend taking torsemide & spironolactone today.  Can hold AM of cath & take meds after cath.  We will see if he needs admission after getting cath readings.   Ok to proceed with cath with Hgb 9.7.  No evidence of active bleeding.   Juanda Crumble, PA-C

## 2023-01-10 ENCOUNTER — Other Ambulatory Visit: Payer: Self-pay

## 2023-01-10 ENCOUNTER — Inpatient Hospital Stay (HOSPITAL_COMMUNITY)
Admission: RE | Admit: 2023-01-10 | Discharge: 2023-01-12 | DRG: 286 | Disposition: A | Discharge status: Home / Self Care | Payer: PPO | Attending: Cardiovascular Disease | Admitting: Cardiovascular Disease

## 2023-01-10 ENCOUNTER — Encounter (HOSPITAL_COMMUNITY)
Admission: RE | Disposition: A | Discharge status: Home / Self Care | Payer: Self-pay | Source: Home / Self Care | Attending: Cardiology

## 2023-01-10 DIAGNOSIS — E1122 Type 2 diabetes mellitus with diabetic chronic kidney disease: Secondary | ICD-10-CM | POA: Diagnosis present

## 2023-01-10 DIAGNOSIS — Z888 Allergy status to other drugs, medicaments and biological substances status: Secondary | ICD-10-CM

## 2023-01-10 DIAGNOSIS — I10 Essential (primary) hypertension: Secondary | ICD-10-CM | POA: Diagnosis present

## 2023-01-10 DIAGNOSIS — Z7984 Long term (current) use of oral hypoglycemic drugs: Secondary | ICD-10-CM

## 2023-01-10 DIAGNOSIS — I130 Hypertensive heart and chronic kidney disease with heart failure and stage 1 through stage 4 chronic kidney disease, or unspecified chronic kidney disease: Principal | ICD-10-CM | POA: Diagnosis present

## 2023-01-10 DIAGNOSIS — Z950 Presence of cardiac pacemaker: Secondary | ICD-10-CM | POA: Diagnosis present

## 2023-01-10 DIAGNOSIS — Z794 Long term (current) use of insulin: Secondary | ICD-10-CM

## 2023-01-10 DIAGNOSIS — H5789 Other specified disorders of eye and adnexa: Secondary | ICD-10-CM | POA: Diagnosis present

## 2023-01-10 DIAGNOSIS — R350 Frequency of micturition: Secondary | ICD-10-CM | POA: Diagnosis present

## 2023-01-10 DIAGNOSIS — Z1152 Encounter for screening for COVID-19: Secondary | ICD-10-CM

## 2023-01-10 DIAGNOSIS — D45 Polycythemia vera: Secondary | ICD-10-CM | POA: Diagnosis present

## 2023-01-10 DIAGNOSIS — N401 Enlarged prostate with lower urinary tract symptoms: Secondary | ICD-10-CM | POA: Diagnosis present

## 2023-01-10 DIAGNOSIS — I2510 Atherosclerotic heart disease of native coronary artery without angina pectoris: Secondary | ICD-10-CM | POA: Diagnosis present

## 2023-01-10 DIAGNOSIS — Z79899 Other long term (current) drug therapy: Secondary | ICD-10-CM

## 2023-01-10 DIAGNOSIS — I495 Sick sinus syndrome: Secondary | ICD-10-CM | POA: Diagnosis present

## 2023-01-10 DIAGNOSIS — I5043 Acute on chronic combined systolic (congestive) and diastolic (congestive) heart failure: Secondary | ICD-10-CM | POA: Diagnosis present

## 2023-01-10 DIAGNOSIS — N1832 Chronic kidney disease, stage 3b: Secondary | ICD-10-CM | POA: Diagnosis present

## 2023-01-10 DIAGNOSIS — I428 Other cardiomyopathies: Secondary | ICD-10-CM

## 2023-01-10 DIAGNOSIS — Z20822 Contact with and (suspected) exposure to covid-19: Secondary | ICD-10-CM | POA: Diagnosis present

## 2023-01-10 DIAGNOSIS — I272 Pulmonary hypertension, unspecified: Secondary | ICD-10-CM | POA: Diagnosis present

## 2023-01-10 DIAGNOSIS — I420 Dilated cardiomyopathy: Secondary | ICD-10-CM | POA: Diagnosis present

## 2023-01-10 DIAGNOSIS — I951 Orthostatic hypotension: Secondary | ICD-10-CM | POA: Diagnosis present

## 2023-01-10 DIAGNOSIS — I5042 Chronic combined systolic (congestive) and diastolic (congestive) heart failure: Secondary | ICD-10-CM

## 2023-01-10 DIAGNOSIS — J439 Emphysema, unspecified: Secondary | ICD-10-CM | POA: Diagnosis present

## 2023-01-10 HISTORY — PX: RIGHT/LEFT HEART CATH AND CORONARY ANGIOGRAPHY: CATH118266

## 2023-01-10 LAB — POCT I-STAT EG7
Acid-Base Excess: 2 mmol/L (ref 0.0–2.0)
Acid-Base Excess: 1 mmol/L (ref 0.0–2.0)
Bicarbonate: 25.8 mmol/L (ref 20.0–28.0)
Bicarbonate: 25.4 mmol/L (ref 20.0–28.0)
Calcium, Ion: 1.13 mmol/L — ABNORMAL LOW (ref 1.15–1.40)
Calcium, Ion: 1.13 mmol/L — ABNORMAL LOW (ref 1.15–1.40)
HCT: 34 % — ABNORMAL LOW (ref 39.0–52.0)
HCT: 34 % — ABNORMAL LOW (ref 39.0–52.0)
Hemoglobin: 11.6 g/dL — ABNORMAL LOW (ref 13.0–17.0)
Hemoglobin: 11.6 g/dL — ABNORMAL LOW (ref 13.0–17.0)
O2 Saturation: 50 %
O2 Saturation: 53 %
Potassium: 3.7 mmol/L (ref 3.5–5.1)
Potassium: 3.7 mmol/L (ref 3.5–5.1)
Sodium: 142 mmol/L (ref 135–145)
Sodium: 143 mmol/L (ref 135–145)
TCO2: 27 mmol/L (ref 22–32)
TCO2: 27 mmol/L (ref 22–32)
pCO2, Ven: 37.5 mmHg — ABNORMAL LOW (ref 44–60)
pCO2, Ven: 36.9 mmHg — ABNORMAL LOW (ref 44–60)
pH, Ven: 7.446 — ABNORMAL HIGH (ref 7.25–7.43)
pH, Ven: 7.445 — ABNORMAL HIGH (ref 7.25–7.43)
pO2, Ven: 25 mmHg — CL (ref 32–45)
pO2, Ven: 27 mmHg — CL (ref 32–45)

## 2023-01-10 LAB — CBC
HCT: 35.3 % — ABNORMAL LOW (ref 39.0–52.0)
Hemoglobin: 10.3 g/dL — ABNORMAL LOW (ref 13.0–17.0)
MCH: 29.3 pg (ref 26.0–34.0)
MCHC: 29.2 g/dL — ABNORMAL LOW (ref 30.0–36.0)
MCV: 100.3 fL — ABNORMAL HIGH (ref 80.0–100.0)
Platelets: 605 10*3/uL — ABNORMAL HIGH (ref 150–400)
RBC: 3.52 MIL/uL — ABNORMAL LOW (ref 4.22–5.81)
RDW: 20.3 % — ABNORMAL HIGH (ref 11.5–15.5)
WBC: 6.9 10*3/uL (ref 4.0–10.5)
nRBC: 0.3 % — ABNORMAL HIGH (ref 0.0–0.2)

## 2023-01-10 LAB — GLUCOSE, CAPILLARY
Glucose-Capillary: 103 mg/dL — ABNORMAL HIGH (ref 70–99)
Glucose-Capillary: 110 mg/dL — ABNORMAL HIGH (ref 70–99)
Glucose-Capillary: 111 mg/dL — ABNORMAL HIGH (ref 70–99)
Glucose-Capillary: 134 mg/dL — ABNORMAL HIGH (ref 70–99)
Glucose-Capillary: 99 mg/dL (ref 70–99)

## 2023-01-10 LAB — POCT I-STAT 7, (LYTES, BLD GAS, ICA,H+H)
Acid-Base Excess: 0 mmol/L (ref 0.0–2.0)
Bicarbonate: 23.1 mmol/L (ref 20.0–28.0)
Calcium, Ion: 1.12 mmol/L — ABNORMAL LOW (ref 1.15–1.40)
HCT: 35 % — ABNORMAL LOW (ref 39.0–52.0)
Hemoglobin: 11.9 g/dL — ABNORMAL LOW (ref 13.0–17.0)
O2 Saturation: 87 %
Potassium: 3.8 mmol/L (ref 3.5–5.1)
Sodium: 142 mmol/L (ref 135–145)
TCO2: 24 mmol/L (ref 22–32)
pCO2 arterial: 32.2 mmHg (ref 32–48)
pH, Arterial: 7.464 — ABNORMAL HIGH (ref 7.35–7.45)
pO2, Arterial: 49 mmHg — ABNORMAL LOW (ref 83–108)

## 2023-01-10 LAB — CREATININE, SERUM
Creatinine, Ser: 1.44 mg/dL — ABNORMAL HIGH (ref 0.61–1.24)
GFR, Estimated: 52 mL/min — ABNORMAL LOW (ref >60)

## 2023-01-10 LAB — POCT ACTIVATED CLOTTING TIME: Activated Clotting Time: 165 s

## 2023-01-10 SURGERY — RIGHT/LEFT HEART CATH AND CORONARY ANGIOGRAPHY
Anesthesia: LOCAL

## 2023-01-10 MED ORDER — HEPARIN SODIUM (PORCINE) 1000 UNIT/ML IJ SOLN
INTRAMUSCULAR | Status: AC
  Filled 2023-01-10: qty 10

## 2023-01-10 MED ORDER — ALLOPURINOL 300 MG PO TABS
300.0000 mg | ORAL_TABLET | Freq: Every day | ORAL | Status: DC
  Administered 2023-01-11 – 2023-01-12 (×2): 300 mg via ORAL
  Filled 2023-01-10 (×2): qty 1

## 2023-01-10 MED ORDER — TRIAMCINOLONE ACETONIDE 0.1 % EX CREA
1.0000 | TOPICAL_CREAM | Freq: Two times a day (BID) | CUTANEOUS | Status: DC
  Administered 2023-01-10 – 2023-01-11 (×2): 1 via TOPICAL
  Filled 2023-01-10: qty 15

## 2023-01-10 MED ORDER — BUDESONIDE 0.5 MG/2ML IN SUSP
2.0000 mL | Freq: Two times a day (BID) | RESPIRATORY_TRACT | Status: DC
  Administered 2023-01-12: 0.5 mg via RESPIRATORY_TRACT
  Filled 2023-01-10 (×3): qty 2

## 2023-01-10 MED ORDER — HEPARIN (PORCINE) IN NACL 1000-0.9 UT/500ML-% IV SOLN
INTRAVENOUS | Status: DC | PRN
  Administered 2023-01-10: 1500 mL

## 2023-01-10 MED ORDER — CHOLESTYRAMINE 4 G PO PACK: 4.0000 g | PACK | Freq: Every day | ORAL | Status: DC | PRN

## 2023-01-10 MED ORDER — CYANOCOBALAMIN 500 MCG PO TABS
ORAL_TABLET | Freq: Every day | ORAL | Status: DC
  Administered 2023-01-10 – 2023-01-11 (×2): 500 ug via ORAL
  Filled 2023-01-10 (×3): qty 1

## 2023-01-10 MED ORDER — VERAPAMIL HCL 2.5 MG/ML IV SOLN
INTRAVENOUS | Status: DC | PRN
  Administered 2023-01-10: 10 mL via INTRA_ARTERIAL

## 2023-01-10 MED ORDER — LIDOCAINE HCL (PF) 1 % IJ SOLN
INTRAMUSCULAR | Status: AC
  Filled 2023-01-10: qty 30

## 2023-01-10 MED ORDER — SODIUM BICARBONATE 650 MG PO TABS
650.0000 mg | ORAL_TABLET | Freq: Two times a day (BID) | ORAL | Status: DC
  Administered 2023-01-10 – 2023-01-12 (×4): 650 mg via ORAL
  Filled 2023-01-10 (×4): qty 1

## 2023-01-10 MED ORDER — MORPHINE SULFATE (PF) 2 MG/ML IV SOLN: 1.0000 mg | INTRAVENOUS | Status: DC | PRN

## 2023-01-10 MED ORDER — SODIUM CHLORIDE 0.9% FLUSH: 3.0000 mL | INTRAVENOUS | Status: DC | PRN

## 2023-01-10 MED ORDER — INSULIN ASPART 100 UNIT/ML IJ SOLN
0.0000 [IU] | INTRAMUSCULAR | Status: DC
  Administered 2023-01-11 – 2023-01-12 (×3): 2 [IU] via SUBCUTANEOUS

## 2023-01-10 MED ORDER — HEPARIN SODIUM (PORCINE) 1000 UNIT/ML IJ SOLN
INTRAMUSCULAR | Status: DC | PRN
  Administered 2023-01-10: 5000 [IU] via INTRAVENOUS

## 2023-01-10 MED ORDER — SODIUM CHLORIDE 0.9 % IV SOLN: 250.0000 mL | INTRAVENOUS | Status: DC | PRN

## 2023-01-10 MED ORDER — ONDANSETRON HCL 4 MG/2ML IJ SOLN: 4.0000 mg | Freq: Four times a day (QID) | INTRAMUSCULAR | Status: DC | PRN

## 2023-01-10 MED ORDER — FENTANYL CITRATE (PF) 100 MCG/2ML IJ SOLN
INTRAMUSCULAR | Status: AC
  Filled 2023-01-10: qty 2

## 2023-01-10 MED ORDER — SODIUM CHLORIDE 0.9 % IV SOLN: INTRAVENOUS | Status: DC

## 2023-01-10 MED ORDER — TAMSULOSIN HCL 0.4 MG PO CAPS
0.4000 mg | ORAL_CAPSULE | Freq: Every day | ORAL | Status: DC
  Administered 2023-01-10 – 2023-01-12 (×3): 0.4 mg via ORAL
  Filled 2023-01-10 (×3): qty 1

## 2023-01-10 MED ORDER — IOHEXOL 350 MG/ML SOLN
INTRAVENOUS | Status: DC | PRN
  Administered 2023-01-10: 65 mL via INTRA_ARTERIAL

## 2023-01-10 MED ORDER — VERAPAMIL HCL 2.5 MG/ML IV SOLN
INTRAVENOUS | Status: AC
  Filled 2023-01-10: qty 2

## 2023-01-10 MED ORDER — MIDAZOLAM HCL 2 MG/2ML IJ SOLN
INTRAMUSCULAR | Status: AC
  Filled 2023-01-10: qty 2

## 2023-01-10 MED ORDER — ACETAMINOPHEN 500 MG PO TABS
1000.0000 mg | ORAL_TABLET | Freq: Four times a day (QID) | ORAL | Status: DC | PRN
  Administered 2023-01-10 – 2023-01-11 (×2): 1000 mg via ORAL
  Filled 2023-01-10 (×2): qty 2

## 2023-01-10 MED ORDER — CITALOPRAM HYDROBROMIDE 10 MG PO TABS
5.0000 mg | ORAL_TABLET | Freq: Every morning | ORAL | Status: DC
  Administered 2023-01-11 – 2023-01-12 (×2): 5 mg via ORAL
  Filled 2023-01-10 (×2): qty 1

## 2023-01-10 MED ORDER — HYDRALAZINE HCL 20 MG/ML IJ SOLN: 10.0000 mg | INTRAMUSCULAR | Status: AC | PRN

## 2023-01-10 MED ORDER — PANTOPRAZOLE SODIUM 40 MG PO TBEC
40.0000 mg | DELAYED_RELEASE_TABLET | Freq: Every day | ORAL | Status: DC
  Administered 2023-01-11 – 2023-01-12 (×2): 40 mg via ORAL
  Filled 2023-01-10 (×2): qty 1

## 2023-01-10 MED ORDER — CARVEDILOL 3.125 MG PO TABS
3.1250 mg | ORAL_TABLET | Freq: Two times a day (BID) | ORAL | Status: DC
  Administered 2023-01-10 – 2023-01-11 (×2): 3.125 mg via ORAL
  Filled 2023-01-10 (×2): qty 1

## 2023-01-10 MED ORDER — FUROSEMIDE 10 MG/ML IJ SOLN
INTRAMUSCULAR | Status: AC
  Filled 2023-01-10: qty 8

## 2023-01-10 MED ORDER — IPRATROPIUM-ALBUTEROL 0.5-2.5 (3) MG/3ML IN SOLN: 3.0000 mL | Freq: Four times a day (QID) | RESPIRATORY_TRACT | Status: DC | PRN

## 2023-01-10 MED ORDER — LIDOCAINE HCL (PF) 1 % IJ SOLN
INTRAMUSCULAR | Status: DC | PRN
  Administered 2023-01-10: 5 mL
  Administered 2023-01-10 (×2): 2 mL

## 2023-01-10 MED ORDER — TURMERIC 500 MG PO CAPS: 1.0000 | ORAL_CAPSULE | Freq: Every day | ORAL | Status: DC

## 2023-01-10 MED ORDER — SODIUM CHLORIDE 0.9% FLUSH
3.0000 mL | Freq: Two times a day (BID) | INTRAVENOUS | Status: DC
  Administered 2023-01-10 – 2023-01-11 (×2): 3 mL via INTRAVENOUS

## 2023-01-10 MED ORDER — HEPARIN SODIUM (PORCINE) 5000 UNIT/ML IJ SOLN
5000.0000 [IU] | Freq: Three times a day (TID) | INTRAMUSCULAR | Status: DC
  Administered 2023-01-10 – 2023-01-12 (×5): 5000 [IU] via SUBCUTANEOUS
  Filled 2023-01-10 (×5): qty 1

## 2023-01-10 MED ORDER — FUROSEMIDE 10 MG/ML IJ SOLN
INTRAMUSCULAR | Status: DC | PRN
  Administered 2023-01-10: 80 mg via INTRAVENOUS

## 2023-01-10 MED ORDER — DICYCLOMINE HCL 20 MG PO TABS
20.0000 mg | ORAL_TABLET | Freq: Two times a day (BID) | ORAL | Status: DC
  Administered 2023-01-10 – 2023-01-12 (×4): 20 mg via ORAL
  Filled 2023-01-10 (×5): qty 1

## 2023-01-10 MED ORDER — MIDAZOLAM HCL 2 MG/2ML IJ SOLN
INTRAMUSCULAR | Status: DC | PRN
  Administered 2023-01-10: 1 mg via INTRAVENOUS

## 2023-01-10 MED ORDER — FUROSEMIDE 10 MG/ML IJ SOLN
80.0000 mg | Freq: Two times a day (BID) | INTRAMUSCULAR | Status: DC
  Administered 2023-01-10 – 2023-01-11 (×3): 80 mg via INTRAVENOUS
  Filled 2023-01-10 (×3): qty 8

## 2023-01-10 MED ORDER — OXYBUTYNIN CHLORIDE ER 5 MG PO TB24
15.0000 mg | ORAL_TABLET | Freq: Every morning | ORAL | Status: DC
  Administered 2023-01-11 – 2023-01-12 (×2): 15 mg via ORAL
  Filled 2023-01-10 (×2): qty 1

## 2023-01-10 MED ORDER — FENTANYL CITRATE (PF) 100 MCG/2ML IJ SOLN
INTRAMUSCULAR | Status: DC | PRN
  Administered 2023-01-10: 25 ug via INTRAVENOUS

## 2023-01-10 MED ORDER — SPIRONOLACTONE 12.5 MG HALF TABLET
12.5000 mg | ORAL_TABLET | Freq: Every day | ORAL | Status: DC
  Administered 2023-01-11 – 2023-01-12 (×2): 12.5 mg via ORAL
  Filled 2023-01-10 (×2): qty 1

## 2023-01-10 MED ORDER — NAPHAZOLINE-GLYCERIN 0.012-0.25 % OP SOLN: 1.0000 [drp] | Freq: Every day | OPHTHALMIC | Status: DC | PRN

## 2023-01-10 MED ORDER — MAGNESIUM GLUCONATE 500 MG PO TABS
500.0000 mg | ORAL_TABLET | Freq: Every day | ORAL | Status: DC
  Administered 2023-01-11 – 2023-01-12 (×2): 500 mg via ORAL
  Filled 2023-01-10 (×2): qty 1

## 2023-01-10 MED ORDER — HYDROXYUREA 500 MG PO CAPS
1000.0000 mg | ORAL_CAPSULE | Freq: Every day | ORAL | Status: DC
  Administered 2023-01-11 – 2023-01-12 (×2): 1000 mg via ORAL
  Filled 2023-01-10 (×2): qty 2

## 2023-01-10 MED ORDER — ASPIRIN 81 MG PO CHEW: 81.0000 mg | CHEWABLE_TABLET | ORAL | Status: DC

## 2023-01-10 MED ORDER — SODIUM CHLORIDE 0.9 % IV SOLN: INTRAVENOUS | Status: AC

## 2023-01-10 MED ORDER — LABETALOL HCL 5 MG/ML IV SOLN: 10.0000 mg | INTRAVENOUS | Status: AC | PRN

## 2023-01-10 SURGICAL SUPPLY — 15 items
CATH BALLN WEDGE 5F 110CM (CATHETERS) IMPLANT
CATH INFINITI 5FR ANG PIGTAIL (CATHETERS) IMPLANT
CATH INFINITI AMBI 5FR TG (CATHETERS) IMPLANT
CATH SWAN GANZ 7F STRAIGHT (CATHETERS) IMPLANT
DEVICE RAD COMP TR BAND LRG (VASCULAR PRODUCTS) IMPLANT
GLIDESHEATH SLEND SS 6F .021 (SHEATH) IMPLANT
GUIDEWIRE .025 260CM (WIRE) IMPLANT
GUIDEWIRE INQWIRE 1.5J.035X260 (WIRE) IMPLANT
INQWIRE 1.5J .035X260CM (WIRE) ×1
KIT MICROPUNCTURE NIT STIFF (SHEATH) IMPLANT
PACK CARDIAC CATHETERIZATION (CUSTOM PROCEDURE TRAY) ×1 IMPLANT
SET ATX-X65L (MISCELLANEOUS) IMPLANT
SHEATH GLIDE SLENDER 4/5FR (SHEATH) IMPLANT
SHEATH PINNACLE 7F 10CM (SHEATH) IMPLANT
SHEATH PROBE COVER 6X72 (BAG) IMPLANT

## 2023-01-10 NOTE — Interval H&P Note (Signed)
History and Physical Interval Note:  01/10/2023 10:57 AM  William Villanueva  has presented today for surgery, with the diagnosis of cardiomyopathy.  The various methods of treatment have been discussed with the patient and family. After consideration of risks, benefits and other options for treatment, the patient has consented to  Procedure(s): RIGHT/LEFT HEART CATH AND CORONARY ANGIOGRAPHY (N/A)  PERCUTANEOUS CORONARY INTERVENTION  as a surgical intervention.  The patient's history has been reviewed, patient examined, no change in status, stable for surgery.  I have reviewed the patient's chart and labs.  Questions were answered to the patient's satisfaction.    Cath Lab Visit (complete for each Cath Lab visit)  Clinical Evaluation Leading to the Procedure:   ACS: No.  Non-ACS:    Anginal Classification: No Symptoms  NYHA CLASS III CHF  Anti-ischemic medical therapy: Minimal Therapy (1 class of medications)  Non-Invasive Test Results: Equivocal test results - Dro in EF on Echo - 20%  Prior CABG: No previous CABG    Bryan Lemma

## 2023-01-11 ENCOUNTER — Encounter (HOSPITAL_COMMUNITY): Payer: Self-pay | Admitting: Cardiology

## 2023-01-11 ENCOUNTER — Other Ambulatory Visit (HOSPITAL_COMMUNITY): Payer: Self-pay

## 2023-01-11 DIAGNOSIS — E1122 Type 2 diabetes mellitus with diabetic chronic kidney disease: Secondary | ICD-10-CM | POA: Diagnosis not present

## 2023-01-11 DIAGNOSIS — Z950 Presence of cardiac pacemaker: Secondary | ICD-10-CM | POA: Diagnosis not present

## 2023-01-11 DIAGNOSIS — Z888 Allergy status to other drugs, medicaments and biological substances status: Secondary | ICD-10-CM | POA: Diagnosis not present

## 2023-01-11 DIAGNOSIS — I420 Dilated cardiomyopathy: Secondary | ICD-10-CM | POA: Diagnosis not present

## 2023-01-11 DIAGNOSIS — Z1152 Encounter for screening for COVID-19: Secondary | ICD-10-CM | POA: Diagnosis not present

## 2023-01-11 DIAGNOSIS — Z20822 Contact with and (suspected) exposure to covid-19: Secondary | ICD-10-CM | POA: Diagnosis not present

## 2023-01-11 DIAGNOSIS — I10 Essential (primary) hypertension: Secondary | ICD-10-CM | POA: Diagnosis not present

## 2023-01-11 DIAGNOSIS — I130 Hypertensive heart and chronic kidney disease with heart failure and stage 1 through stage 4 chronic kidney disease, or unspecified chronic kidney disease: Secondary | ICD-10-CM | POA: Diagnosis not present

## 2023-01-11 DIAGNOSIS — I428 Other cardiomyopathies: Secondary | ICD-10-CM | POA: Diagnosis not present

## 2023-01-11 DIAGNOSIS — J439 Emphysema, unspecified: Secondary | ICD-10-CM | POA: Diagnosis not present

## 2023-01-11 DIAGNOSIS — I5043 Acute on chronic combined systolic (congestive) and diastolic (congestive) heart failure: Secondary | ICD-10-CM | POA: Diagnosis not present

## 2023-01-11 DIAGNOSIS — I951 Orthostatic hypotension: Secondary | ICD-10-CM | POA: Diagnosis not present

## 2023-01-11 DIAGNOSIS — R350 Frequency of micturition: Secondary | ICD-10-CM | POA: Diagnosis not present

## 2023-01-11 DIAGNOSIS — D45 Polycythemia vera: Secondary | ICD-10-CM | POA: Diagnosis not present

## 2023-01-11 DIAGNOSIS — I2510 Atherosclerotic heart disease of native coronary artery without angina pectoris: Secondary | ICD-10-CM | POA: Diagnosis not present

## 2023-01-11 DIAGNOSIS — I272 Pulmonary hypertension, unspecified: Secondary | ICD-10-CM | POA: Diagnosis not present

## 2023-01-11 DIAGNOSIS — Z7984 Long term (current) use of oral hypoglycemic drugs: Secondary | ICD-10-CM | POA: Diagnosis not present

## 2023-01-11 DIAGNOSIS — H5789 Other specified disorders of eye and adnexa: Secondary | ICD-10-CM | POA: Diagnosis not present

## 2023-01-11 DIAGNOSIS — Z794 Long term (current) use of insulin: Secondary | ICD-10-CM | POA: Diagnosis not present

## 2023-01-11 DIAGNOSIS — N401 Enlarged prostate with lower urinary tract symptoms: Secondary | ICD-10-CM | POA: Diagnosis not present

## 2023-01-11 DIAGNOSIS — Z79899 Other long term (current) drug therapy: Secondary | ICD-10-CM | POA: Diagnosis not present

## 2023-01-11 DIAGNOSIS — N1832 Chronic kidney disease, stage 3b: Secondary | ICD-10-CM | POA: Diagnosis not present

## 2023-01-11 DIAGNOSIS — I495 Sick sinus syndrome: Secondary | ICD-10-CM | POA: Diagnosis not present

## 2023-01-11 LAB — COMPREHENSIVE METABOLIC PANEL
ALT: 10 U/L (ref 0–44)
AST: 15 U/L (ref 15–41)
Albumin: 3.2 g/dL — ABNORMAL LOW (ref 3.5–5.0)
Alkaline Phosphatase: 76 U/L (ref 38–126)
Anion gap: 13 (ref 5–15)
BUN: 26 mg/dL — ABNORMAL HIGH (ref 8–23)
CO2: 24 mmol/L (ref 22–32)
Calcium: 9 mg/dL (ref 8.9–10.3)
Chloride: 102 mmol/L (ref 98–111)
Creatinine, Ser: 1.58 mg/dL — ABNORMAL HIGH (ref 0.61–1.24)
GFR, Estimated: 46 mL/min — ABNORMAL LOW (ref >60)
Glucose, Bld: 102 mg/dL — ABNORMAL HIGH (ref 70–99)
Potassium: 4.1 mmol/L (ref 3.5–5.1)
Sodium: 139 mmol/L (ref 135–145)
Total Bilirubin: 1.3 mg/dL — ABNORMAL HIGH (ref 0.3–1.2)
Total Protein: 6.8 g/dL (ref 6.5–8.1)

## 2023-01-11 LAB — GLUCOSE, CAPILLARY
Glucose-Capillary: 108 mg/dL — ABNORMAL HIGH (ref 70–99)
Glucose-Capillary: 121 mg/dL — ABNORMAL HIGH (ref 70–99)
Glucose-Capillary: 137 mg/dL — ABNORMAL HIGH (ref 70–99)

## 2023-01-11 MED ORDER — CARVEDILOL 6.25 MG PO TABS
6.2500 mg | ORAL_TABLET | Freq: Two times a day (BID) | ORAL | Status: DC
  Administered 2023-01-11 – 2023-01-12 (×3): 6.25 mg via ORAL
  Filled 2023-01-11 (×3): qty 1

## 2023-01-11 MED ORDER — EMPAGLIFLOZIN 10 MG PO TABS
10.0000 mg | ORAL_TABLET | Freq: Every day | ORAL | Status: DC
  Administered 2023-01-11 – 2023-01-12 (×2): 10 mg via ORAL
  Filled 2023-01-11 (×2): qty 1

## 2023-01-11 NOTE — Progress Notes (Signed)
Rounding Note    Patient Name: William Villanueva Date of Encounter: 01/11/2023  [facility] Cardiologist: Olga Millers, MD   Subjective   Denies any CP or SOB.  Inpatient Medications    Scheduled Meds:  allopurinol  300 mg Oral Daily   budesonide  2 mL Nebulization BID   carvedilol  3.125 mg Oral BID WC   citalopram  5 mg Oral q AM   cyanocobalamin   Oral Daily   dicyclomine  20 mg Oral BID   furosemide  80 mg Intravenous BID   heparin  5,000 Units Subcutaneous Q8H   hydroxyurea  1,000 mg Oral Daily   insulin aspart  0-15 Units Subcutaneous Q4H   magnesium gluconate  500 mg Oral Daily   oxybutynin  15 mg Oral q AM   pantoprazole  40 mg Oral Daily   sodium bicarbonate  650 mg Oral BID   sodium chloride flush  3 mL Intravenous Q12H   spironolactone  12.5 mg Oral Daily   tamsulosin  0.4 mg Oral Daily   triamcinolone cream  1 Application Topical BID   Continuous Infusions:  sodium chloride     PRN Meds: sodium chloride, acetaminophen, cholestyramine, ipratropium-albuterol, morphine injection, naphazoline-glycerin, ondansetron (ZOFRAN) IV, sodium chloride flush   Vital Signs    Vitals:   01/10/23 2343 01/11/23 0406 01/11/23 0407 01/11/23 0734  BP:    96/76  Pulse:   99 (!) 106  Resp: 18 18 18  (!) 118  Temp: 98.4 F (36.9 C) 98.6 F (37 C) 98.6 F (37 C) 97.6 F (36.4 C)  TempSrc: Oral Oral Oral Oral  SpO2:    96%  Weight:      Height:        Intake/Output Summary (Last 24 hours) at 01/11/2023 0758 Last data filed at 01/11/2023 0433 Gross per 24 hour  Intake 240 ml  Output 2700 ml  Net -2460 ml      01/10/2023    8:01 AM 01/08/2023    5:25 PM 01/03/2023   10:59 AM  Last 3 Weights  Weight (lbs) 234 lb 234 lb 234 lb  Weight (kg) 106.142 kg 106.142 kg 106.142 kg      Telemetry    Paced rhythm - Personally Reviewed  ECG    Paced rhythm - Personally Reviewed  Physical Exam   GEN: No acute distress.   Neck: No JVD Cardiac: RRR, no  murmurs, rubs, or gallops.  Respiratory: Clear to auscultation bilaterally. GI: Soft, nontender, non-distended  MS: No edema; No deformity. Neuro:  Nonfocal  Psych: Normal affect   Labs    High Sensitivity Troponin:   Recent Labs  Lab 12/21/22 1922 12/21/22 2201  TROPONINIHS 62* 69*     Chemistry Recent Labs  Lab 01/08/23 1730 01/10/23 1120 01/10/23 1143 01/10/23 1144 01/10/23 1503 01/11/23 0355  NA 138   < > 142 143  --  139  K 4.0   < > 3.7 3.7  --  4.1  CL 102  --   --   --   --  102  CO2 26  --   --   --   --  24  GLUCOSE 96  --   --   --   --  102*  BUN 31*  --   --   --   --  26*  CREATININE 1.64*  --   --   --  1.44* 1.58*  CALCIUM 8.4*  --   --   --   --  9.0  PROT 7.1  --   --   --   --  6.8  ALBUMIN 3.4*  --   --   --   --  3.2*  AST 15  --   --   --   --  15  ALT 11  --   --   --   --  10  ALKPHOS 69  --   --   --   --  76  BILITOT 0.8  --   --   --   --  1.3*  GFRNONAA 44*  --   --   --  52* 46*  ANIONGAP 10  --   --   --   --  13   < > = values in this interval not displayed.    Lipids No results for input(s): "CHOL", "TRIG", "HDL", "LABVLDL", "LDLCALC", "CHOLHDL" in the last 168 hours.  Hematology Recent Labs  Lab 01/08/23 1730 01/10/23 1120 01/10/23 1143 01/10/23 1144 01/10/23 1503  WBC 8.0  --   --   --  6.9  RBC 3.22*  --   --   --  3.52*  HGB 9.7*   < > 11.6* 11.6* 10.3*  HCT 32.4*   < > 34.0* 34.0* 35.3*  MCV 100.6*  --   --   --  100.3*  MCH 30.1  --   --   --  29.3  MCHC 29.9*  --   --   --  29.2*  RDW 20.3*  --   --   --  20.3*  PLT 711*  --   --   --  605*   < > = values in this interval not displayed.   Thyroid No results for input(s): "TSH", "FREET4" in the last 168 hours.  BNP Recent Labs  Lab 01/08/23 1801  BNP 1,183.7*    DDimer No results for input(s): "DDIMER" in the last 168 hours.   Radiology    CARDIAC CATHETERIZATION  Result Date: 01/10/2023 POST-CATH DIAGNOSES Nonischemic cardiomyopathy with angiographically  normal coronary arteries with large caliber PDA and PL system and large OM branches and diagonal branches but diminutive LAD after D3. Moderate Pulmonary Hypertension due to venous congestion from LV failure: Hemodynamics: RAP 20 mmHg, RVP having EDP 49/19-22 mmHg; PAP-mean 48/36-38 mmHg, PCWP average 30 to 34 mmHg; LV P-EDP 120/20 1-31,000,000 recreate, AOP-MAP 123/76-96 mmHg Ao sat 87%, PA sat 51% -> Fick cardiac output-index 6.25-2.80; TD 5.35-2.4 RECOMMENDATIONS WITH recent ER visit for heart failure and severely elevated filling pressures symptoms are consistent with acute on chronic combined systolic and diastolic heart failure and I think the best method of treatment is admission for IV diuresis and titration of heart failure medications. I have written for 80 mg IV twice daily Lasix with 80 mg given here in the Cath Lab Consider converting to Entresto plus or minus SGLT2 interpreter. Was on insulin at home, will use sliding scale here and adjust accordingly. Bryan Lemma, MD    Cardiac Studies   Cath 01/10/2023 POST-CATH DIAGNOSES Nonischemic cardiomyopathy with angiographically normal coronary arteries with large caliber PDA and PL system and large OM branches and diagonal branches but diminutive LAD after D3. Moderate Pulmonary Hypertension due to venous congestion from LV failure: Hemodynamics: RAP 20 mmHg, RVP having EDP 49/19-22 mmHg; PAP-mean 48/36-38 mmHg, PCWP average 30 to 34 mmHg; LV P-EDP 120/20 1-31,000,000 recreate, AOP-MAP 123/76-96 mmHg  Ao sat 87%, PA sat 51% -> Fick cardiac output-index 6.25-2.80; TD 5.35-2.4  RECOMMENDATIONS WITH recent ER visit for heart failure and severely elevated filling pressures symptoms are consistent with acute on chronic combined systolic and diastolic heart failure and I think the best method of treatment is admission for IV diuresis and titration of heart failure medications. I have written for 80 mg IV twice daily Lasix with 80 mg given here in  the Cath Lab Consider converting to Entresto plus or minus SGLT2 interpreter. Was on insulin at home, will use sliding scale here and adjust accordingly.    Patient Profile     72 y.o. male with PMH of previous minimal CAD on cath 2013, NICM, combined systolic and diastolic CHF, SSS s/p CRT-P 2015  w upgrade to CRT-D 04/2022 who was seen recently in the clinic on 01/03/2023 for worsening CHF symptom, scheduled for outpatient left and right heart cath.    Assessment & Plan    Acute on chronic combined systolic and diastolic CHF - Echo 12/22/2022 showed EF < 20%, severely reduced RVEF. EF down from 40% compare to previous echo from 02/04/2022  - Outpatient left and right heart cath 01/10/2023 showed no CAD, wedge pressure 30-34 mmHg, LVEDP 31, PAP mean 48/36 - 38 mmHg. CO 2.8.   - given elevated LVEDP and wedge pressure on cath, admitted for diuresis  - per recent office note "patient is intolerant to Entresto secondary to hypotension, ACE/ARB secondary to orthostasis. Cannot take SGLT2 secondary to risk of GU infections "  - continue on 80mg  BID IV lasix, I/O overnight 2.4L. Appears to be euvolemic on exam, however may want to push for 1 more day of IV lasix and see response. Patient had UTI remotely, but not recently, I don't see Marcelline Deist or Jardiance ever prescribed, his copay for both are $0. Will start on jardiance 10mg  daily.   - coreg 3.125mg  BID, spironolactone 12.5mg  daily. Added Jardiance today. No BP room to add entresto. Patient would benefit from establish with CHF service as outpatient. Likely DC tomorrow  NICM s/p CRT-D 04/2022  For questions or updates, please contact [facility] Please consult [email or web address] for contact info under        Signed, Azalee Course, PA  01/11/2023, 7:58 AM

## 2023-01-11 NOTE — TOC Benefit Eligibility Note (Signed)
Patient Product/process development scientist completed.    The patient is insured through [employer]/ [employer]. Patient has Medicare and is not eligible for a copay card, but may be able to apply for patient assistance, if available.    Ran test claim for Entresto 24-26 mg and the current 30 day co-pay is $0.00.  Ran test claim for Farxiga 10 mg and the current 30 day co-pay is $0.00.  Ran test claim for Jardiance 10 mg and the current 30 day co-pay is $0.00.   This test claim was processed through [facility]- copay amounts may vary at other pharmacies due to pharmacy/plan contracts, or as the patient moves through the different stages of their insurance plan.     Roland Earl, CPHT Pharmacy Technician III Certified Patient Advocate [facility] Patient Advocate Team Direct Number: [phone]  Fax: [phone]

## 2023-01-12 ENCOUNTER — Other Ambulatory Visit: Payer: Self-pay | Admitting: Physician Assistant

## 2023-01-12 ENCOUNTER — Other Ambulatory Visit (HOSPITAL_COMMUNITY): Payer: Self-pay

## 2023-01-12 ENCOUNTER — Telehealth: Payer: Self-pay | Admitting: Pharmacy Technician

## 2023-01-12 DIAGNOSIS — I5043 Acute on chronic combined systolic (congestive) and diastolic (congestive) heart failure: Secondary | ICD-10-CM | POA: Diagnosis not present

## 2023-01-12 LAB — GLUCOSE, CAPILLARY
Glucose-Capillary: 104 mg/dL — ABNORMAL HIGH (ref 70–99)
Glucose-Capillary: 115 mg/dL — ABNORMAL HIGH (ref 70–99)
Glucose-Capillary: 123 mg/dL — ABNORMAL HIGH (ref 70–99)

## 2023-01-12 LAB — COMPREHENSIVE METABOLIC PANEL
ALT: 9 U/L (ref 0–44)
AST: 17 U/L (ref 15–41)
Albumin: 3.3 g/dL — ABNORMAL LOW (ref 3.5–5.0)
Alkaline Phosphatase: 69 U/L (ref 38–126)
Anion gap: 15 (ref 5–15)
BUN: 33 mg/dL — ABNORMAL HIGH (ref 8–23)
CO2: 24 mmol/L (ref 22–32)
Calcium: 9.2 mg/dL (ref 8.9–10.3)
Chloride: 97 mmol/L — ABNORMAL LOW (ref 98–111)
Creatinine, Ser: 1.98 mg/dL — ABNORMAL HIGH (ref 0.61–1.24)
GFR, Estimated: 35 mL/min — ABNORMAL LOW (ref >60)
Glucose, Bld: 97 mg/dL (ref 70–99)
Potassium: 4.1 mmol/L (ref 3.5–5.1)
Sodium: 136 mmol/L (ref 135–145)
Total Bilirubin: 1.2 mg/dL (ref 0.3–1.2)
Total Protein: 7.1 g/dL (ref 6.5–8.1)

## 2023-01-12 MED ORDER — EMPAGLIFLOZIN 10 MG PO TABS: 10.0000 mg | ORAL_TABLET | Freq: Every day | ORAL | 3 refills | Status: DC

## 2023-01-12 MED ORDER — TORSEMIDE 20 MG PO TABS
40.0000 mg | ORAL_TABLET | Freq: Two times a day (BID) | ORAL | Status: DC
  Administered 2023-01-12: 40 mg via ORAL
  Filled 2023-01-12: qty 2

## 2023-01-12 MED ORDER — TORSEMIDE 40 MG PO TABS
40.0000 mg | ORAL_TABLET | Freq: Every day | ORAL | 1 refills | Status: DC
Start: 2023-01-12 — End: 2023-06-19

## 2023-01-12 MED ORDER — VITAMIN B-12 100 MCG PO TABS
500.0000 ug | ORAL_TABLET | Freq: Every day | ORAL | Status: DC
  Administered 2023-01-12: 500 ug via ORAL
  Filled 2023-01-12: qty 5

## 2023-01-12 MED ORDER — CARVEDILOL 6.25 MG PO TABS: 6.2500 mg | ORAL_TABLET | Freq: Two times a day (BID) | ORAL | 2 refills | Status: DC

## 2023-01-12 NOTE — Plan of Care (Signed)
  Problem: Education: Goal: Understanding of CV disease, CV risk reduction, and recovery process will improve Outcome: Progressing   Problem: Activity: Goal: Ability to return to baseline activity level will improve Outcome: Progressing   Problem: Cardiovascular: Goal: Ability to achieve and maintain adequate cardiovascular perfusion will improve Outcome: Progressing   Problem: Health Behavior/Discharge Planning: Goal: Ability to safely manage health-related needs after discharge will improve Outcome: Progressing   Problem: Education: Goal: Knowledge of General Education information will improve Description: Including pain rating scale, medication(s)/side effects and non-pharmacologic comfort measures Outcome: Progressing   Problem: Clinical Measurements: Goal: Ability to maintain clinical measurements within normal limits will improve Outcome: Progressing Goal: Will remain free from infection Outcome: Progressing

## 2023-01-12 NOTE — Telephone Encounter (Signed)
Pharmacy Patient Advocate Encounter   Received notification from Fax that prior authorization for William Villanueva is required/requested.   Insurance verification completed.   The patient is insured through [employer] .   Per test claim: PA required; PA submitted to [employer] via CoverMyMeds Key/confirmation #/EOC [id number] Status is pending

## 2023-01-12 NOTE — Telephone Encounter (Signed)
Pharmacy Patient Advocate Encounter  Received notification from [employer] that Prior Authorization for soaanz(torsemide)  has been DENIED.  Refill too soon. PA not needed.   PA #/Case ID/Reference #: [id number]

## 2023-01-12 NOTE — Progress Notes (Signed)
Rounding Note    Patient Name: William Villanueva Date of Encounter: 01/12/2023  [facility] Cardiologist: Olga Millers, MD   Subjective   Denies any CP or SOB. Has some dizziness when getting up to go to bathroom overnight.   Inpatient Medications    Scheduled Meds:  allopurinol  300 mg Oral Daily   budesonide  2 mL Nebulization BID   carvedilol  6.25 mg Oral BID WC   citalopram  5 mg Oral q AM   dicyclomine  20 mg Oral BID   empagliflozin  10 mg Oral Daily   heparin  5,000 Units Subcutaneous Q8H   hydroxyurea  1,000 mg Oral Daily   insulin aspart  0-15 Units Subcutaneous Q4H   magnesium gluconate  500 mg Oral Daily   oxybutynin  15 mg Oral q AM   pantoprazole  40 mg Oral Daily   sodium bicarbonate  650 mg Oral BID   sodium chloride flush  3 mL Intravenous Q12H   spironolactone  12.5 mg Oral Daily   tamsulosin  0.4 mg Oral Daily   torsemide  40 mg Oral BID   triamcinolone cream  1 Application Topical BID   cyanocobalamin  500 mcg Oral Daily   Continuous Infusions:  sodium chloride     PRN Meds: sodium chloride, acetaminophen, cholestyramine, ipratropium-albuterol, morphine injection, naphazoline-glycerin, ondansetron (ZOFRAN) IV, sodium chloride flush   Vital Signs    Vitals:   01/11/23 1159 01/11/23 1544 01/11/23 2014 01/12/23 0450  BP: 103/65 (!) 103/59 102/72 (!) 96/56  Pulse: 100 93 94 99  Resp: 18 18 16 18   Temp: 97.6 F (36.4 C) 97.9 F (36.6 C) 97.6 F (36.4 C) 97.6 F (36.4 C)  TempSrc: Oral Oral Oral Oral  SpO2: 95% 96% 96%   Weight:      Height:        Intake/Output Summary (Last 24 hours) at 01/12/2023 0750 Last data filed at 01/12/2023 0500 Gross per 24 hour  Intake 200 ml  Output 1850 ml  Net -1650 ml      01/10/2023    8:01 AM 01/08/2023    5:25 PM 01/03/2023   10:59 AM  Last 3 Weights  Weight (lbs) 234 lb 234 lb 234 lb  Weight (kg) 106.142 kg 106.142 kg 106.142 kg      Telemetry    Paced rhythm, single 8 beats run of  NSVT - Personally Reviewed  ECG    Paced rhythm - Personally Reviewed  Physical Exam   GEN: No acute distress.   Neck: No JVD Cardiac: RRR, no murmurs, rubs, or gallops.  Respiratory: Clear to auscultation bilaterally. GI: Soft, nontender, non-distended  MS: No edema; No deformity. Neuro:  Nonfocal  Psych: Normal affect   Labs    High Sensitivity Troponin:   Recent Labs  Lab 12/21/22 1922 12/21/22 2201  TROPONINIHS 62* 69*     Chemistry Recent Labs  Lab 01/08/23 1730 01/10/23 1120 01/10/23 1143 01/10/23 1144 01/10/23 1503 01/11/23 0355  NA 138   < > 142 143  --  139  K 4.0   < > 3.7 3.7  --  4.1  CL 102  --   --   --   --  102  CO2 26  --   --   --   --  24  GLUCOSE 96  --   --   --   --  102*  BUN 31*  --   --   --   --  26*  CREATININE 1.64*  --   --   --  1.44* 1.58*  CALCIUM 8.4*  --   --   --   --  9.0  PROT 7.1  --   --   --   --  6.8  ALBUMIN 3.4*  --   --   --   --  3.2*  AST 15  --   --   --   --  15  ALT 11  --   --   --   --  10  ALKPHOS 69  --   --   --   --  76  BILITOT 0.8  --   --   --   --  1.3*  GFRNONAA 44*  --   --   --  52* 46*  ANIONGAP 10  --   --   --   --  13   < > = values in this interval not displayed.    Lipids No results for input(s): "CHOL", "TRIG", "HDL", "LABVLDL", "LDLCALC", "CHOLHDL" in the last 168 hours.  Hematology Recent Labs  Lab 01/08/23 1730 01/10/23 1120 01/10/23 1143 01/10/23 1144 01/10/23 1503  WBC 8.0  --   --   --  6.9  RBC 3.22*  --   --   --  3.52*  HGB 9.7*   < > 11.6* 11.6* 10.3*  HCT 32.4*   < > 34.0* 34.0* 35.3*  MCV 100.6*  --   --   --  100.3*  MCH 30.1  --   --   --  29.3  MCHC 29.9*  --   --   --  29.2*  RDW 20.3*  --   --   --  20.3*  PLT 711*  --   --   --  605*   < > = values in this interval not displayed.   Thyroid No results for input(s): "TSH", "FREET4" in the last 168 hours.  BNP Recent Labs  Lab 01/08/23 1801  BNP 1,183.7*    DDimer No results for input(s): "DDIMER" in the  last 168 hours.   Radiology    CARDIAC CATHETERIZATION  Result Date: 01/10/2023 POST-CATH DIAGNOSES Nonischemic cardiomyopathy with angiographically normal coronary arteries with large caliber PDA and PL system and large OM branches and diagonal branches but diminutive LAD after D3. Moderate Pulmonary Hypertension due to venous congestion from LV failure: Hemodynamics: RAP 20 mmHg, RVP having EDP 49/19-22 mmHg; PAP-mean 48/36-38 mmHg, PCWP average 30 to 34 mmHg; LV P-EDP 120/20 1-31,000,000 recreate, AOP-MAP 123/76-96 mmHg Ao sat 87%, PA sat 51% -> Fick cardiac output-index 6.25-2.80; TD 5.35-2.4 RECOMMENDATIONS WITH recent ER visit for heart failure and severely elevated filling pressures symptoms are consistent with acute on chronic combined systolic and diastolic heart failure and I think the best method of treatment is admission for IV diuresis and titration of heart failure medications. I have written for 80 mg IV twice daily Lasix with 80 mg given here in the Cath Lab Consider converting to Entresto plus or minus SGLT2 interpreter. Was on insulin at home, will use sliding scale here and adjust accordingly. Bryan Lemma, MD    Cardiac Studies   Cath 01/10/2023 POST-CATH DIAGNOSES Nonischemic cardiomyopathy with angiographically normal coronary arteries with large caliber PDA and PL system and large OM branches and diagonal branches but diminutive LAD after D3. Moderate Pulmonary Hypertension due to venous congestion from LV failure: Hemodynamics: RAP 20 mmHg, RVP having EDP 49/19-22 mmHg; PAP-mean  48/36-38 mmHg, PCWP average 30 to 34 mmHg; LV P-EDP 120/20 1-31,000,000 recreate, AOP-MAP 123/76-96 mmHg  Ao sat 87%, PA sat 51% -> Fick cardiac output-index 6.25-2.80; TD 5.35-2.4      RECOMMENDATIONS WITH recent ER visit for heart failure and severely elevated filling pressures symptoms are consistent with acute on chronic combined systolic and diastolic heart failure and I think the best method of  treatment is admission for IV diuresis and titration of heart failure medications. I have written for 80 mg IV twice daily Lasix with 80 mg given here in the Cath Lab Consider converting to Entresto plus or minus SGLT2 interpreter. Was on insulin at home, will use sliding scale here and adjust accordingly.    Patient Profile     72 y.o. male with PMH of previous minimal CAD on cath 2013, NICM, combined systolic and diastolic CHF, SSS s/p CRT-P 2015  w upgrade to CRT-D 04/2022 who was seen recently in the clinic on 01/03/2023 for worsening CHF symptom, scheduled for outpatient left and right heart cath.    Assessment & Plan    Acute on chronic combined systolic and diastolic CHF - Echo 12/22/2022 showed EF < 20%, severely reduced RVEF. EF down from 40% compare to previous echo from 02/04/2022  - Outpatient left and right heart cath 01/10/2023 showed no CAD, wedge pressure 30-34 mmHg, LVEDP 31, PAP mean 48/36 - 38 mmHg. CO 2.8.   - given elevated LVEDP and wedge pressure on cath, admitted for diuresis  - per recent office note "patient is intolerant to Entresto secondary to hypotension, ACE/ARB secondary to orthostasis. Cannot take SGLT2 secondary to risk of GU infections    - coreg 3.125mg  BID, spironolactone 12.5mg  daily. Started on [place] today. No BP room to add entresto. Switched back to oral torsemide today. Discussed with EP team, plan to optimize his device setting as outpatient. Patient would benefit from establish with CHF service as outpatient.   NICM s/p CRT-D 04/2022  For questions or updates, please contact [facility] Please consult [email or web address] for contact info under        Signed, Azalee Course, PA  01/12/2023, 7:50 AM

## 2023-01-12 NOTE — Discharge Summary (Signed)
Discharge Summary    Patient ID: William Villanueva MRN: [id number]; DOB: [birth year]  Admit date: 01/10/2023 Discharge date: 01/12/2023  PCP:  Tally Joe, MD   [facility] Providers Cardiologist:  Olga Millers, MD  Electrophysiologist:  Sherryl Manges, MD       Discharge Diagnoses    Principal Problem:   Acute on chronic combined systolic (congestive) and diastolic (congestive) heart failure [facility]) Active Problems:   Pacemaker-CRT   Essential hypertension   Non-ischemic cardiomyopathy (HCC)   Acute on chronic combined systolic and diastolic CHF (congestive heart failure) (HCC)    Diagnostic Studies/Procedures    Left and Right heart cath 01/10/2023 POST-CATH DIAGNOSES Nonischemic cardiomyopathy with angiographically normal coronary arteries with large caliber PDA and PL system and large OM branches and diagonal branches but diminutive LAD after D3. Moderate Pulmonary Hypertension due to venous congestion from LV failure: Hemodynamics: RAP 20 mmHg, RVP having EDP 49/19-22 mmHg; PAP-mean 48/36-38 mmHg, PCWP average 30 to 34 mmHg; LV P-EDP 120/20 1-31,000,000 recreate, AOP-MAP 123/76-96 mmHg  Ao sat 87%, PA sat 51% -> Fick cardiac output-index 6.25-2.80; TD 5.35-2.4      RECOMMENDATIONS WITH recent ER visit for heart failure and severely elevated filling pressures symptoms are consistent with acute on chronic combined systolic and diastolic heart failure and I think the best method of treatment is admission for IV diuresis and titration of heart failure medications. I have written for 80 mg IV twice daily Lasix with 80 mg given here in the Cath Lab Consider converting to Entresto plus or minus SGLT2 interpreter. Was on insulin at home, will use sliding scale here and adjust accordingly. Right Heart Pressures Hemodynamic findings consistent with moderate pulmonary hypertension. PAP-mean 48/36-38, mmHg; PCWP 30 to 34 mmHg. Elevated LV EDP consistent with volume overload. LV  P-EDP 120/21-31 mmHg; AOP-MAP 123/76-96 mmHg  Right Atrium The right atrium is severely dilated.  Right atrial pressure is elevated. Mean RAP 20 mmHg  Right Ventricle RVP-EDP 49/19-22 mmHg   Diagnostic Dominance: Right  _____________   History of Present Illness     William Villanueva is a 72 y.o. male with PMH of NICM and CRT-D. Patient was first evaluated by Dr. Jens Som in August 2013 with complaints of chest pain. Echo showed EF 35 to 40%. Cardiac catheterization September 2013 showed mild nonobstructive CAD with no hemodynamic evidence of restriction. Cardiac MRI September 2013 showed EF 34% with diffuse hypokinesis, no hyperenhancement or scar tissue and no evidence of cardiac hemochromatosis. Patient was admitted December 2015 with high degree AV block. Subsequently had CRT-P placed. Nuclear study March 2017 showed EF 32%, prior infarct but no ischemia with medical treatment as felt likely attenuation artifact. Chest CT August 2023 showed small bilateral pleural effusions, pulmonary artery enlargement, emphysema, coronary atherosclerosis. Echo September 2023 showed EF 40%, Grade II DD, mild RVH, severe LAE, mild RAE, mild MR. Patient's device upgraded to CRT-D December 2023 after symptomatic high degree AV block noted.    He presented to the ED on 12/21/2022 for increasing shortness of breath and chest tightness worsened with exertion. He also reported 2 week history of dry cough. He reported a 20 lb weight gain over a week with lower extremity edema, orthopnea and PND.  He was diuresed and transition to torsemide 20 mg twice daily on discharge 12/24/2022.  To note patient is intolerant to Entresto secondary to hypotension, ACE/ARB secondary to orthostasis. Cannot take SGLT2 secondary to risk of GU infections.    Patient was seen by Gavin Pound  [place] DNP on December 26, 2022.  Patient had 8-9 pound weight gain with increased shortness of breath.  Case discussed with Dr. Jens Som.  Patient's  torsemide was increased to 40 mg twice daily with follow-up in 1 week.   Today, patient and his wife state pt is not doing well.  They do state after increasing torsemide to 40mg  twice daily, his weight has gone down but not back to baseline yet.  He states his typical weight is 225 to 230 pounds.  He was 234 pounds at home this morning.  He states now he only has a little bit of swelling.  He feels short of breath with mild activity, even going to the car.  He has significant weakness and fatigue.  His wife states he sleeps all day.  He does not eat much or have an appetite.  He gets phlegm in his throat particularly with lying down.  He has not slept flat in a long time.  He denies PND, chest pain, palpitations, lightheadedness and syncope.  Patient complains of frequent urination at night.  He states he takes his second dose of torsemide at 7 PM.  Patient was seen by Juanda Crumble, PA-C on 01/03/2023, given low EF, he was arranged to undergo outpatient cardiac catheterization.  Hospital Course     Consultants: N/A   He presented for the outpatient cardiac catheterization on 9//2024, this revealed no CAD, wedge pressure 30-34 mmHg, LVEDP 31, PAP mean 48/36 - 38 mmHg. CO 2.8.  Given elevated LVEDP and wedge pressure on cath, patient was subsequently admitted to the hospital for IV diuresis.  Although previous note mentions cannot take SGLT2 secondary to risk of GU infection, however patient really has not tried [place] or Jardiance in the past.  Therefore we added Jardiance 10 mg daily to his medical regimen.  He received 80 mg twice daily of IV Lasix for diuresis. I/O after 2 days of diuresis was -4L.   He was paced 86% of the time.  QRS remain fairly wide.  The case was discussed with electrophysiology service who plan to optimize his device setting on outpatient follow-up.  There is some concern that his LVEF drop may be related to ineffective biventricular pacing.  Carvedilol was increased to 6.25  mg twice a day.  He was seen in the morning of 01/12/2023 at which time he appears to be euvolemic on exam.  Creatinine did trend up to 1.98 from 1.58 today before.  He will be discharged on 40 mg daily of torsemide with the instruction of taking additional 40 mg as needed in the afternoon if his weight goes up significantly.  He is deemed stable for discharge from the cardiac perspective.  He will need 1 week follow-up with repeat basic metabolic panel.  Patient was referred to CHF service to establish as outpatient.        Did the patient have an acute coronary syndrome (MI, NSTEMI, STEMI, etc) this admission?:  No                               Did the patient have a percutaneous coronary intervention (stent / angioplasty)?:  No.          _____________  Discharge Vitals Blood pressure (!) 96/56, pulse 99, temperature 97.6 F (36.4 C), temperature source Oral, resp. rate 18, height 5\' 10"  (1.778 m), weight 106.1 kg, SpO2 96%.  Filed Weights   01/10/23 0801  Weight: 106.1 kg    Labs & Radiologic Studies    CBC Recent Labs    01/10/23 1144 01/10/23 1503  WBC  --  6.9  HGB 11.6* 10.3*  HCT 34.0* 35.3*  MCV  --  100.3*  PLT  --  605*   Basic Metabolic Panel Recent Labs    69/62/95 0355 01/12/23 0721  NA 139 136  K 4.1 4.1  CL 102 97*  CO2 24 24  GLUCOSE 102* 97  BUN 26* 33*  CREATININE 1.58* 1.98*  CALCIUM 9.0 9.2   Liver Function Tests Recent Labs    01/11/23 0355 01/12/23 0721  AST 15 17  ALT 10 9  ALKPHOS 76 69  BILITOT 1.3* 1.2  PROT 6.8 7.1  ALBUMIN 3.2* 3.3*   No results for input(s): "LIPASE", "AMYLASE" in the last 72 hours. High Sensitivity Troponin:   Recent Labs  Lab 12/21/22 1922 12/21/22 2201  TROPONINIHS 62* 69*    BNP Invalid input(s): "POCBNP" D-Dimer No results for input(s): "DDIMER" in the last 72 hours. Hemoglobin A1C No results for input(s): "HGBA1C" in the last 72 hours. Fasting Lipid Panel No results for input(s): "CHOL",  "HDL", "LDLCALC", "TRIG", "CHOLHDL", "LDLDIRECT" in the last 72 hours. Thyroid Function Tests No results for input(s): "TSH", "T4TOTAL", "T3FREE", "THYROIDAB" in the last 72 hours.  Invalid input(s): "FREET3" _____________  CARDIAC CATHETERIZATION  Result Date: 01/10/2023 POST-CATH DIAGNOSES Nonischemic cardiomyopathy with angiographically normal coronary arteries with large caliber PDA and PL system and large OM branches and diagonal branches but diminutive LAD after D3. Moderate Pulmonary Hypertension due to venous congestion from LV failure: Hemodynamics: RAP 20 mmHg, RVP having EDP 49/19-22 mmHg; PAP-mean 48/36-38 mmHg, PCWP average 30 to 34 mmHg; LV P-EDP 120/20 1-31,000,000 recreate, AOP-MAP 123/76-96 mmHg Ao sat 87%, PA sat 51% -> Fick cardiac output-index 6.25-2.80; TD 5.35-2.4 RECOMMENDATIONS WITH recent ER visit for heart failure and severely elevated filling pressures symptoms are consistent with acute on chronic combined systolic and diastolic heart failure and I think the best method of treatment is admission for IV diuresis and titration of heart failure medications. I have written for 80 mg IV twice daily Lasix with 80 mg given here in the Cath Lab Consider converting to Entresto plus or minus SGLT2 interpreter. Was on insulin at home, will use sliding scale here and adjust accordingly. Bryan Lemma, MD   DG Chest 2 View  Result Date: 01/08/2023 CLINICAL DATA:  Shortness of breath EXAM: CHEST - 2 VIEW COMPARISON:  12/21/2022 FINDINGS: Mild hyperinflation. Pacer/ICD. Midline trachea. Mild cardiomegaly. Trace bilateral pleural fluid or thickening. Pulmonary venous congestion is slightly increased. No lobar consolidation. Remote lower left rib fractures. IMPRESSION: Cardiomegaly with slight increase in mild pulmonary venous congestion. Probable trace pleural fluid or thickening bilaterally. Electronically Signed   By: Jeronimo Greaves M.D.   On: 01/08/2023 19:00   DG Hand Complete Left  Result  Date: 12/23/2022 CLINICAL DATA:  Pain EXAM: LEFT HAND - COMPLETE 3+ VIEW COMPARISON:  None Available. FINDINGS: There is no evidence of fracture or dislocation. None mild degenerative changes are noted predominantly involving the DIP joints, basilar joint and radiocarpal joint. Soft tissues are unremarkable. IMPRESSION: 1. No acute findings. 2. Mild osteoarthritis. Electronically Signed   By: Signa Kell M.D.   On: 12/23/2022 11:50   ECHOCARDIOGRAM COMPLETE  Result Date: 12/22/2022    ECHOCARDIOGRAM REPORT   Patient Name:   William Villanueva Date of Exam: 12/22/2022 Medical Rec #:  [id number]  Height:       68.0 in Accession #:    [id number]       Weight:       244.5 lb Date of Birth:  1951-01-23         BSA:          2.226 m Patient Age:    72 years         BP:           110/73 mmHg Patient Gender: M                HR:           90 bpm. Exam Location:  Inpatient Procedure: 2D Echo, Cardiac Doppler and Color Doppler Indications:    acute systolic chf  History:        Patient has prior history of Echocardiogram examinations, most                 recent 02/04/2022. Cardiomyopathy, Pacemaker, COPD and chronic                 kidney disease; Risk Factors:Diabetes, Hypertension and Sleep                 Apnea.  Sonographer:    Delcie Roch RDCS Referring Phys: [id number] ERIC CHEN IMPRESSIONS  1. LV systolic function is severely depressed with diffuse hypokinesis; inferior, septal and apical akinesis. COmpared to echo from Sept 2023, LVEF is worse . Left ventricular ejection fraction, by estimation, is <20%. The left ventricle has severely decreased function. The left ventricle demonstrates global hypokinesis. The left ventricular internal cavity size was severely dilated.  2. Right ventricular systolic function is severely reduced. The right ventricular size is normal. There is moderately elevated pulmonary artery systolic pressure.  3. Left atrial size was moderately dilated.  4. Right atrial size was moderately  dilated.  5. Trivial mitral valve regurgitation.  6. The aortic valve is normal in structure. Aortic valve regurgitation is not visualized.  7. The inferior vena cava is dilated in size with <50% respiratory variability, suggesting right atrial pressure of 15 mmHg. FINDINGS  Left Ventricle: LV systolic function is severely depressed with diffuse hypokinesis; inferior, septal and apical akinesis. COmpared to echo from Sept 2023, LVEF is worse. Left ventricular ejection fraction, by estimation, is <20%. The left ventricle has  severely decreased function. The left ventricle demonstrates global hypokinesis. Definity contrast agent was given IV to delineate the left ventricular endocardial borders. The left ventricular internal cavity size was severely dilated. There is no left  ventricular hypertrophy. Right Ventricle: The right ventricular size is normal. Right vetricular wall thickness was not assessed. Right ventricular systolic function is severely reduced. There is moderately elevated pulmonary artery systolic pressure. The tricuspid regurgitant velocity is 2.90 m/s, and with an assumed right atrial pressure of 15 mmHg, the estimated right ventricular systolic pressure is 48.6 mmHg. Left Atrium: Left atrial size was moderately dilated. Right Atrium: Right atrial size was moderately dilated. Pericardium: Trivial pericardial effusion is present. Mitral Valve: There is mild thickening of the mitral valve leaflet(s). Mild mitral annular calcification. Trivial mitral valve regurgitation. Tricuspid Valve: The tricuspid valve is normal in structure. Tricuspid valve regurgitation is mild. Aortic Valve: The aortic valve is normal in structure. Aortic valve regurgitation is not visualized. Pulmonic Valve: The pulmonic valve was normal in structure. Pulmonic valve regurgitation is not visualized. Aorta: The aortic root and ascending aorta are structurally normal, with no evidence of dilitation. Venous:  The inferior vena cava  is dilated in size with less than 50% respiratory variability, suggesting right atrial pressure of 15 mmHg. IAS/Shunts: No atrial level shunt detected by color flow Doppler. Additional Comments: A device lead is visualized.  LEFT VENTRICLE PLAX 2D LVIDd:         6.60 cm   Diastology LVIDs:         6.40 cm   LV e' medial:  5.55 cm/s LV PW:         0.80 cm   LV e' lateral: 5.11 cm/s LV IVS:        0.70 cm LVOT diam:     2.10 cm LV SV:         35 LV SV Index:   16 LVOT Area:     3.46 cm  RIGHT VENTRICLE            IVC RV S prime:     9.36 cm/s  IVC diam: 3.00 cm TAPSE (M-mode): 1.5 cm LEFT ATRIUM              Index        RIGHT ATRIUM           Index LA diam:        4.70 cm  2.11 cm/m   RA Area:     24.80 cm LA Vol (A2C):   80.8 ml  36.30 ml/m  RA Volume:   85.80 ml  38.55 ml/m LA Vol (A4C):   125.0 ml 56.16 ml/m LA Biplane Vol: 105.0 ml 47.17 ml/m  AORTIC VALVE LVOT Vmax:   57.40 cm/s LVOT Vmean:  41.300 cm/s LVOT VTI:    0.100 m  AORTA Ao Root diam: 3.20 cm Ao Asc diam:  3.20 cm TRICUSPID VALVE TR Peak grad:   33.6 mmHg TR Vmax:        290.00 cm/s  SHUNTS Systemic VTI:  0.10 m Systemic Diam: 2.10 cm Dietrich Pates MD Electronically signed by Dietrich Pates MD Signature Date/Time: 12/22/2022/5:01:04 PM    Final    DG Chest 2 View  Result Date: 12/21/2022 CLINICAL DATA:  Shortness of breath, chest tightness EXAM: CHEST - 2 VIEW COMPARISON:  06/03/2022 FINDINGS: Left AICD remains in place, unchanged. Mild cardiomegaly, vascular congestion. Bibasilar atelectasis and possible small effusions. No overt edema or acute bony abnormality. IMPRESSION: Cardiomegaly, vascular congestion. Bibasilar atelectasis.  Question small bilateral effusions. Electronically Signed   By: Charlett Nose M.D.   On: 12/21/2022 20:41   Disposition   Pt is being discharged home today in good condition.  Follow-up Plans & Appointments     Follow-up Information     Sheilah Pigeon, PA-C Follow up on 01/15/2023.   Specialty:  Cardiology Why: 11:20AM. Cardiology follow up Contact information: [address] [place] [address] [phone]         Carlos Levering, NP Follow up on 01/19/2023.   Specialty: Cardiology Why: 1:55PM. Cardiology follow up. Need BMET blood after office visit to recheck renal function Contact information: [address] [place] [address] [phone]                   Discharge Medications   Allergies as of 01/12/2023       Reactions   Advil [ibuprofen] Itching   Keflex [cephalexin] Diarrhea, Other (See Comments)   Caused C-diff, also   Wellbutrin [bupropion] Hives, Other (See Comments)   Prednisone Itching, Other (See Comments)   Abdominal  pain, also   Temazepam Other (See Comments)   Dizziness    Trazodone And Nefazodone Other (See Comments)   Dizziness   Sacubitril-valsartan Other (See Comments)   Unknown   Prozac [fluoxetine Hcl] Itching        Medication List     TAKE these medications    acetaminophen 500 MG tablet Commonly known as: TYLENOL Take 1,000 mg by mouth every 6 (six) hours as needed for headache.   allopurinol 300 MG tablet Commonly known as: ZYLOPRIM TAKE 1 TABLET BY MOUTH DAILY   B-12 PO Take 1 tablet by mouth daily.   budesonide 0.5 MG/2ML nebulizer solution Commonly known as: PULMICORT Take 2 mLs (0.5 mg total) by nebulization 2 (two) times daily. (RINSE MOUTH AFTER USE)   carvedilol 6.25 MG tablet Commonly known as: COREG Take 1 tablet (6.25 mg total) by mouth 2 (two) times daily with a meal. What changed:  medication strength how much to take   cholestyramine 4 GM/DOSE powder Commonly known as: QUESTRAN Take 4 g by mouth daily as needed (diarrhea).   citalopram 10 MG tablet Commonly known as: CELEXA Take 5 mg by mouth in the morning.   diclofenac Sodium 1 % Gel Commonly known as: VOLTAREN Apply 2 g topically daily as needed (pain).   dicyclomine 20 MG tablet Commonly known  as: BENTYL Take 20 mg by mouth 2 (two) times daily.   empagliflozin 10 MG Tabs tablet Commonly known as: JARDIANCE Take 1 tablet (10 mg total) by mouth daily. Start taking on: January 13, 2023   hydroxyurea 500 MG capsule Commonly known as: HYDREA TAKE TWO CAPSULES BY MOUTH DAILY   ipratropium-albuterol 0.5-2.5 (3) MG/3ML Soln Commonly known as: DUONEB Inhale one vial in nebulizer twice daily and in between if needed   magnesium gluconate 500 MG tablet Commonly known as: MAGONATE Take 500 mg by mouth in the morning and at bedtime.   naphazoline-glycerin 0.012-0.25 % Soln Commonly known as: CLEAR EYES REDNESS Place 1-2 drops into both eyes daily as needed for eye irritation.   omeprazole 20 MG capsule Commonly known as: PRILOSEC Take 20 mg by mouth in the morning and at bedtime.   oxybutynin 15 MG 24 hr tablet Commonly known as: DITROPAN XL Take 15 mg by mouth in the morning.   QC Tumeric Complex 500 MG Caps Generic drug: Turmeric Take 1 tablet by mouth daily.   sodium bicarbonate 650 MG tablet Take 1 tablet (650 mg total) by mouth 2 (two) times daily.   spironolactone 25 MG tablet Commonly known as: ALDACTONE Take 0.5 tablets (12.5 mg total) by mouth daily.   tamsulosin 0.4 MG Caps capsule Commonly known as: FLOMAX Take 0.4 mg by mouth daily.   Torsemide 40 MG Tabs Take 40 mg by mouth daily. May take extra dose in the afternoon if has increased swelling or if weight goes up by more than 3 lbs What changed:  how much to take when to take this additional instructions   Tresiba FlexTouch 100 UNIT/ML FlexTouch Pen Generic drug: insulin degludec Inject 16 Units into the skin daily after breakfast.   triamcinolone cream 0.1 % Commonly known as: KENALOG Apply 1 Application topically 2 (two) times daily. What changed:  when to take this reasons to take this           Outstanding Labs/Studies   Repeat BMET on follow up  Duration of Discharge Encounter    Greater than 30 minutes including physician time.  Signed, Azalee Course,  PA 01/12/2023, 10:07 AM

## 2023-01-14 NOTE — Progress Notes (Unsigned)
Cardiology Office Note Date:  01/14/2023  Patient ID:  William, Villanueva 10-Dec-1950, MRN [id number] PCP:  Tally Joe, MD  Cardiologist:  Dr. Jens Som Electrophysiologist: Dr. Graciela Husbands    Chief Complaint:   *** post hospital, device optimization  History of Present Illness: William Villanueva is a 72 y.o. male with history of NICM, CHB, CRT-P, polycythemia vera, hemochromatosis, orthostatic hypotension.  In review of notes: Cardiac catheterization in September of 2013 showed mild nonobstructive coronary disease. There was no hemodynamic evidence of restriction. Pulmonary capillary wedge pressure 10.  Cardiac MRI in September of 2013 showed an ejection fraction of 34% with diffuse hypokinesis. There was no hyperenhancement or scar tissue and no evidence of cardiac hemochromatosis  2021, EF again reduced >> ECG optimization >> 45-50% in 2022  TTE with LVEF 40%, global hypokinesis TTE Aug 2024 <20%  Saw Dr. Graciela Husbands April 2024, orthostatic symptoms and losartan stopped Saw A. Tillery June 2024, volume stable, no changes made   ED on 12/21/2022 for increasing shortness of breath and chest tightness worsened with exertion. He also reported 2 week history of dry cough. He reported a 20 lb weight gain over a week with lower extremity edema, orthopnea and PND.  He was diuresed and transition to torsemide 20 mg twice daily on discharge 12/24/2022.    Saw Cards team 12/26/22, diuretics adjusted  Saw cards in f/u 01/03/23 cont to struggle with volume, suspected at/nearing endstage HF planned for cath    Patient is intolerant  Entresto secondary to hypotension ACE/ARB secondary to orthostasis Cannot take SGLT2 secondary to risk of GU infections.    01/09/23: R/LHC as planned and admitted post cath for diuresis.  Diuresed 4L, Jardiance added to his meds, coreg increased.  BP % only 86%, QRS noted to be wider, and reached out to EP to arrange attempts to EKG optimization Discharged 01/12/23  ***  volume, CorVue *** %BP *** EKG *** tol meds?   Device information SJM CRT-P implanted 04/29/2014, gen change 04/28/22    Past Medical History:  Diagnosis Date   AV block, 2nd degree 2015   [employer] Jude Allure Quadra pulse generator [id number], model PM 3242   Back pain    Cardiomyopathy (HCC)    Nonischemic 45%.    CHF (congestive heart failure) (HCC)    Diabetes mellitus without complication (HCC)    Fracture of lumbar vertebra without spinal cord injury, sequela 10/09/2022   Gout    Hemochromatosis    Hypertension    Hypospadias 07/17/1950   born with   Nephrolithiasis    Pacemaker lead malfunction-elevated threshold RV lead 07/31/2014   Polycythemia vera(238.4)     Past Surgical History:  Procedure Laterality Date   ANKLE SURGERY     BI-VENTRICULAR PACEMAKER INSERTION N/A 04/29/2014   Procedure: BI-VENTRICULAR PACEMAKER INSERTION (CRT-P);  Surgeon: Duke Salvia, MD; Laterality: Left  St. Jude Allure Quadra pulse generator [phone], model [place] 8469   CARDIAC SURGERY     COLONOSCOPY N/A 02/15/2020   Procedure: COLONOSCOPY;  Surgeon: Kerin Salen, MD;  Location: [facility] ENDOSCOPY;  Service: Gastroenterology;  Laterality: N/A;   ESOPHAGOGASTRODUODENOSCOPY (EGD) WITH PROPOFOL N/A 02/15/2020   Procedure: ESOPHAGOGASTRODUODENOSCOPY (EGD) WITH PROPOFOL;  Surgeon: Kerin Salen, MD;  Location: [facility] ENDOSCOPY;  Service: Gastroenterology;  Laterality: N/A;   LEAD INSERTION N/A 04/28/2022   Procedure: LEAD INSERTION;  Surgeon: Duke Salvia, MD;  Location: [facility] INVASIVE CV LAB;  Service: Cardiovascular;  Laterality: N/A;   PILONIDAL CYST EXCISION     POSTERIOR  LAMINECTOMY / DECOMPRESSION LUMBAR SPINE     PPM GENERATOR CHANGEOUT N/A 04/28/2022   Procedure: PPM GENERATOR CHANGEOUT;  Surgeon: Duke Salvia, MD;  Location: [facility] INVASIVE CV LAB;  Service: Cardiovascular;  Laterality: N/A;   RIGHT/LEFT HEART CATH AND CORONARY ANGIOGRAPHY N/A 01/10/2023   Procedure: RIGHT/LEFT HEART CATH AND CORONARY  ANGIOGRAPHY;  Surgeon: Marykay Lex, MD;  Location: [facility] INVASIVE CV LAB;  Service: Cardiovascular;  Laterality: N/A;   TONSILLECTOMY      Current Outpatient Medications  Medication Sig Dispense Refill   acetaminophen (TYLENOL) 500 MG tablet Take 1,000 mg by mouth every 6 (six) hours as needed for headache.     allopurinol (ZYLOPRIM) 300 MG tablet TAKE 1 TABLET BY MOUTH DAILY 90 tablet 0   budesonide (PULMICORT) 0.5 MG/2ML nebulizer solution Take 2 mLs (0.5 mg total) by nebulization 2 (two) times daily. (RINSE MOUTH AFTER USE) 360 mL 11   carvedilol (COREG) 6.25 MG tablet Take 1 tablet (6.25 mg total) by mouth 2 (two) times daily with a meal. 180 tablet 2   cholestyramine (QUESTRAN) 4 GM/DOSE powder Take 4 g by mouth daily as needed (diarrhea).     citalopram (CELEXA) 10 MG tablet Take 5 mg by mouth in the morning.     Cyanocobalamin (B-12 PO) Take 1 tablet by mouth daily.     diclofenac Sodium (VOLTAREN) 1 % GEL Apply 2 g topically daily as needed (pain).     dicyclomine (BENTYL) 20 MG tablet Take 20 mg by mouth 2 (two) times daily.     empagliflozin (JARDIANCE) 10 MG TABS tablet Take 1 tablet (10 mg total) by mouth daily. 90 tablet 3   hydroxyurea (HYDREA) 500 MG capsule TAKE TWO CAPSULES BY MOUTH DAILY 160 capsule 4   insulin degludec (TRESIBA FLEXTOUCH) 100 UNIT/ML SOPN FlexTouch Pen Inject 16 Units into the skin daily after breakfast.     ipratropium-albuterol (DUONEB) 0.5-2.5 (3) MG/3ML SOLN Inhale one vial in nebulizer twice daily and in between if needed     magnesium gluconate (MAGONATE) 500 MG tablet Take 500 mg by mouth in the morning and at bedtime.     naphazoline-glycerin (CLEAR EYES REDNESS) 0.012-0.25 % SOLN Place 1-2 drops into both eyes daily as needed for eye irritation.     omeprazole (PRILOSEC) 20 MG capsule Take 20 mg by mouth in the morning and at bedtime.     oxybutynin (DITROPAN XL) 15 MG 24 hr tablet Take 15 mg by mouth in the morning.     sodium bicarbonate 650 MG  tablet Take 1 tablet (650 mg total) by mouth 2 (two) times daily. 60 tablet 0   spironolactone (ALDACTONE) 25 MG tablet Take 0.5 tablets (12.5 mg total) by mouth daily. 45 tablet 3   tamsulosin (FLOMAX) 0.4 MG CAPS capsule Take 0.4 mg by mouth daily.     Torsemide 40 MG TABS Take 40 mg by mouth daily. May take extra dose in the afternoon if has increased swelling or if weight goes up by more than 3 lbs 90 tablet 1   triamcinolone cream (KENALOG) 0.1 % Apply 1 Application topically 2 (two) times daily. (Patient taking differently: Apply 1 Application topically daily as needed (Rash).) 30 g 0   Turmeric (QC TUMERIC COMPLEX) 500 MG CAPS Take 1 tablet by mouth daily.     No current facility-administered medications for this visit.    Allergies:   Advil [ibuprofen], Keflex [cephalexin], Wellbutrin [bupropion], Prednisone, Temazepam, Trazodone and nefazodone, Sacubitril-valsartan, and Prozac [fluoxetine  hcl]   Social History:  The patient  reports that he quit smoking about 9 years ago. His smoking use included cigarettes. He started smoking about 54 years ago. He has a 44.8 pack-year smoking history. He has never been exposed to tobacco smoke. He has never used smokeless tobacco. He reports current alcohol use. He reports that he does not use drugs.   Family History:  The patient's family history includes Diabetes in his sister; Heart disease in his maternal grandmother; Hypertension in his sister; Other in his father; Stroke in his mother.  ROS:  Please see the history of present illness.    All other systems are reviewed and otherwise negative.   PHYSICAL EXAM:  VS:  There were no vitals taken for this visit. BMI: There is no height or weight on file to calculate BMI. Well nourished, well developed, in no acute distress HEENT: normocephalic, atraumatic Neck: no JVD, carotid bruits or masses Cardiac: *** RRR; no significant murmurs, no rubs, or gallops Lungs:  *** CTA b/l, no wheezing, rhonchi  or rales Abd: soft, nontender MS: no deformity or atrophy Ext: *** no edema Skin: warm and dry, no rash Neuro:  No gross deficits appreciated Psych: euthymic mood, full affect  *** PPM site is stable, no tethering or discomfort   EKG:  done today and reviewed by myself ***  Device interrogation done today and reviewed by myself:  ***   01/10/23: R/LHC POST-CATH DIAGNOSES Nonischemic cardiomyopathy with angiographically normal coronary arteries with large caliber PDA and PL system and large OM branches and diagonal branches but diminutive LAD after D3. Moderate Pulmonary Hypertension due to venous congestion from LV failure: Hemodynamics: RAP 20 mmHg, RVP having EDP 49/19-22 mmHg; PAP-mean 48/36-38 mmHg, PCWP average 30 to 34 mmHg; LV P-EDP 120/20 1-31,000,000 recreate, AOP-MAP 123/76-96 mmHg  Ao sat 87%, PA sat 51% -> Fick cardiac output-index 6.25-2.80; TD 5.35-2.4   12/22/22: TTE 1. LV systolic function is severely depressed with diffuse hypokinesis;  inferior, septal and apical akinesis. COmpared to echo from Sept 2023,  LVEF is worse . Left ventricular ejection fraction, by estimation, is  <20%. The left ventricle has severely  decreased function. The left ventricle demonstrates global hypokinesis.  The left ventricular internal cavity size was severely dilated.   2. Right ventricular systolic function is severely reduced. The right  ventricular size is normal. There is moderately elevated pulmonary artery  systolic pressure.   3. Left atrial size was moderately dilated.   4. Right atrial size was moderately dilated.   5. Trivial mitral valve regurgitation.   6. The aortic valve is normal in structure. Aortic valve regurgitation is  not visualized.   7. The inferior vena cava is dilated in size with <50% respiratory  variability, suggesting right atrial pressure of 15 mmHg.    02/04/2022: TTE 1. Left ventricular ejection fraction, by estimation, is 40%. The left   ventricle has moderately decreased function. The left ventricle  demonstrates global hypokinesis. The left ventricular internal cavity size  was severely dilated. Left ventricular  diastolic parameters are consistent with Grade II diastolic dysfunction  (pseudonormalization). Elevated left atrial pressure. The E/e' is 19.8.   2. Right ventricular systolic function is normal. The right ventricular  size is mildly enlarged. There is mildly elevated pulmonary artery  systolic pressure.   3. Left atrial size was severely dilated.   4. Right atrial size was mildly dilated.   5. The mitral valve is grossly normal. Mild mitral valve regurgitation.  6. The aortic valve is tricuspid. There is mild thickening of the aortic  valve. Aortic valve regurgitation is not visualized. Aortic valve  sclerosis is present, with no evidence of aortic valve stenosis.   7. The inferior vena cava is dilated in size with >50% respiratory  variability, suggesting right atrial pressure of 8 mmHg.   Comparison(s): Compared to prior TTE on 06/2020, the LV is now severely  dilated with EF ~40% (previously moderately dilated with EF 45-50%).    06/21/20: TTE 1. Limited AV optimization study.   2. Left ventricular ejection fraction, by estimation, is 45 to 50%. The  left ventricle has mildly decreased function. The left ventricle  demonstrates global hypokinesis. The left ventricular internal cavity size  was moderately dilated. Left ventricular  diastolic parameters are consistent with Grade I diastolic dysfunction  (impaired relaxation).   3. Right ventricular systolic function is normal. The right ventricular  size is normal.   4. The mitral valve is normal in structure. No evidence of mitral valve  regurgitation. No evidence of mitral stenosis.   5. The aortic valve was not assessed.    01/14/2020: TTE IMPRESSIONS   1. Left ventricular ejection fraction, by estimation, is 30 to 35%. The  left ventricle has  moderately decreased function. The left ventricle  demonstrates regional wall motion abnormalities (see scoring  diagram/findings for description). Left ventricular   diastolic parameters are consistent with Grade II diastolic dysfunction  (pseudonormalization).   2. Right ventricular systolic function is normal. The right ventricular  size is normal.   3. Left atrial size was moderately dilated.   4. The mitral valve is normal in structure. Trivial mitral valve  regurgitation. No evidence of mitral stenosis.   5. Tricuspid valve regurgitation is moderate.   6. The aortic valve is normal in structure. Aortic valve regurgitation is  not visualized. No aortic stenosis is present.   7. The inferior vena cava is normal in size with greater than 50%  respiratory variability, suggesting right atrial pressure of 3 mmHg.   05/15/2018: LVEF 45-50% 2018: LVEF 45-50% 2016: LVEF 50-55% 2015 LVEF 35-40% > 45% 2014: LVEF 45% 2013 LVEF 35-40%  07/27/2015: stress myoview Nuclear stress EF: 32%. There was no ST segment deviation noted during stress. Defect 1: There is a medium defect of moderate severity present in the basal inferior, basal inferolateral, mid inferior and mid inferolateral location. Findings consistent with prior myocardial infarction. This is an intermediate risk study. The left ventricular ejection fraction is moderately decreased (30-44%).   Mildly dilated LV with moderately decreased LVEF. There is a fixed defect in the basal and mid inferior and inferolateral wall consistent with prior infarct.   Recent Labs: 12/24/2022: Magnesium 1.9 01/08/2023: B Natriuretic Peptide 1,183.7 01/10/2023: Hemoglobin 10.3; Platelets 605 01/12/2023: ALT 9; BUN 33; Creatinine, Ser 1.98; Potassium 4.1; Sodium 136  12/22/2022: Cholesterol 62; HDL 24; LDL Cholesterol 28; Total CHOL/HDL Ratio 2.6; Triglycerides 51; VLDL 10   Estimated Creatinine Clearance: 40.9 mL/min (A) (by C-G formula based on SCr of  1.98 mg/dL (H)).   Wt Readings from Last 3 Encounters:  01/12/23 224 lb 3.2 oz (101.7 kg)  01/08/23 234 lb (106.1 kg)  01/03/23 234 lb (106.1 kg)     Other studies reviewed: Additional studies/records reviewed today include: summarized above  ASSESSMENT AND PLAN:  1. CRT-P    ***  2. NICM 3. Chronic CHF (systolic)     LVEF wobbling over the years >>> worse to <20% by his  last echo    ***     GDMT limited by orthostatic hypotension     *** % BP      Recent HF hospitalization   Disposition: ***  Current medicines are reviewed at length with the patient today.  The patient did not have any concerns regarding medicines.  Norma Fredrickson, PA-C 01/14/2023 9:28 AM     [facility] [address] [place] [address] [phone] (office)  [phone] (fax)

## 2023-01-15 ENCOUNTER — Encounter: Payer: Self-pay | Admitting: Physician Assistant

## 2023-01-15 ENCOUNTER — Ambulatory Visit: Payer: PPO | Attending: Internal Medicine | Admitting: Physician Assistant

## 2023-01-15 VITALS — BP 100/66 | HR 94 | Ht 70.0 in | Wt 229.6 lb

## 2023-01-15 DIAGNOSIS — Z95810 Presence of automatic (implantable) cardiac defibrillator: Secondary | ICD-10-CM | POA: Diagnosis not present

## 2023-01-15 DIAGNOSIS — I5022 Chronic systolic (congestive) heart failure: Secondary | ICD-10-CM

## 2023-01-15 DIAGNOSIS — I428 Other cardiomyopathies: Secondary | ICD-10-CM | POA: Diagnosis not present

## 2023-01-15 NOTE — Patient Instructions (Signed)
Medication Instructions:   Your physician recommends that you continue on your current medications as directed. Please refer to the Current Medication list given to you today.  *If you need a refill on your cardiac medications before your next appointment, please call your pharmacy*   Lab Work: NONE ORDERED  TODAY   If you have labs (blood work) drawn today and your tests are completely normal, you will receive your results only by: MyChart Message (if you have MyChart) OR A paper copy in the mail If you have any lab test that is abnormal or we need to change your treatment, we will call you to review the results.   Testing/Procedures: NONE ORDERED  TODAY     Follow-Up: At [facility], you and your health needs are our priority.  As part of our continuing mission to provide you with exceptional heart care, we have created designated Provider Care Teams.  These Care Teams include your primary Cardiologist (physician) and Advanced Practice Providers (APPs -  Physician Assistants and Nurse Practitioners) who all work together to provide you with the care you need, when you need it.  We recommend signing up for the patient portal called "MyChart".  Sign up information is provided on this After Visit Summary.  MyChart is used to connect with patients for Virtual Visits (Telemedicine).  Patients are able to view lab/test results, encounter notes, upcoming appointments, etc.  Non-urgent messages can be sent to your provider as well.   To learn more about what you can do with MyChart, go to [email or web address].    Your next appointment AS SCHEDULED    Provider:   You may see Sherryl Manges, MD  or one of the following Advanced Practice Providers on your designated Care Team:   Francis Dowse, [place] [address] [place], [place] Sherie Don, NP Canary Brim, NP    Other Instructions

## 2023-01-17 NOTE — Progress Notes (Deleted)
Office Visit Note  Patient: William Villanueva             Date of Birth: Nov 16, 1950           MRN: [id number]             PCP: William Joe, MD Referring: William Joe, MD Visit Date: 01/22/2023   Subjective:  No chief complaint on file.   History of Present Illness: William Villanueva is a 72 y.o. male here for follow up for rheumatoid arthritis and gout on allopurinol 300 mg daily.    Previous HPI 08/28/2022 William Villanueva is a 72 y.o. male here for follow up for rheumatoid arthritis and gout on allopurinol 200 mg daily.  He felt a lot of benefit with the joint steroid injections from previous visit his right hand and left shoulder.  The shoulder remains largely improved although he still has mild symptoms there.  Right hand is giving him more trouble again especially at the thumb he is using a brace that is partially helpful.  No new definite gout flareups of inflammation.     Previous HPI 04/07/22 William Villanueva is a 72 y.o. male here for follow up for rheumatoid arthritis and gout currently on allopurinol increased to 200 mg daily after our initial visit. He continues having joint pain and stiffness at multiple areas. Most problematic are his right thumb and his left shoulder. Swelling stays mostly at his feet and ankles. He got sick with RSV and delayed his planned pacemaker battery replacement so that is coming up. Still has a bit of increased cough mostly nonproductive.    Previous HPI 02/23/22 William Villanueva is a 72 y.o. male here for rheumatoid arthritis. He was previously seeing William Villanueva rheumatology on treatment with methotrexate and had been on allopurinol and uloric for gout. He has history of carpal tunnel syndrome with surgical treatment for the left wrist.  Original diagnosis was RA he had joint pain in multiple areas with abnormal laboratory test.  He was started on injectable methotrexate but switched to oral methotrexate due to pain and difficulty with the  injections.  He stopped following up with her clinic due to dissatisfaction including never seeing his MD and  not addressing any complaints not directly attributable to his inflammatory arthritis.  As result he has been off any disease specific medication for over a year does feel he has increased in joint pains off of treatment.  He had also been off of the Uloric they were prescribing but was restarted on allopurinol in the past few months with his PCP office.  He reports 1 flare of gout in his foot within the past month but before that had been free of any attacks for several months duration.  He still has residual swelling and erythema in the left foot but was told this may be coming from his heart failure edema as well.   Generally has daily joint pain in multiple areas particular including bilateral hands and in his back.  He gets foot and ankle pain most severe during gout flares has mild symptoms otherwise.  More recently also developed increased left shoulder pain for several days duration for which he went to urgent care for evaluation of this but ended up going to the hospital due to hypoxia and increased cough so the original problem was never addressed.  Treatment course at the hospital for congestive heart failure exacerbation and improved after several days.  He still had increased cough producing significant discolored sputum  on insulin at home, will use sliding scale here and adjust accordingly. William Lemma, MD   DG Chest 2 View  Result Date: 01/08/2023 CLINICAL DATA:  Shortness of breath EXAM: CHEST - 2 VIEW COMPARISON:  12/21/2022 FINDINGS: Mild hyperinflation. Pacer/ICD. Midline trachea. Mild cardiomegaly. Trace bilateral pleural fluid or thickening. Pulmonary venous congestion is slightly increased. No lobar consolidation. Remote lower left rib fractures. IMPRESSION: Cardiomegaly with slight increase in mild pulmonary venous congestion. Probable trace pleural fluid or thickening bilaterally. Electronically Signed   By: William Villanueva M.D.   On: 01/08/2023 19:00   DG Hand Complete Left  Result Date: 12/23/2022 CLINICAL DATA:  Pain EXAM: LEFT HAND - COMPLETE 3+ VIEW COMPARISON:  None Available. FINDINGS: There is no evidence of fracture or dislocation. None mild degenerative changes are noted predominantly involving the DIP joints, basilar joint and radiocarpal joint. Soft tissues are unremarkable. IMPRESSION: 1. No acute findings. 2.  Mild osteoarthritis. Electronically Signed   By: William Villanueva M.D.   On: 12/23/2022 11:50   ECHOCARDIOGRAM COMPLETE  Result Date: 12/22/2022    ECHOCARDIOGRAM REPORT   Patient Name:   William Villanueva Date of Exam: 12/22/2022 Medical Rec #:  [id number]        Height:       68.0 in Accession #:    [id number]       Weight:       244.5 lb Date of Birth:  11-Apr-1951         BSA:          2.226 m Patient Age:    72 years         BP:           110/73 mmHg Patient Gender: M                HR:           90 bpm. Exam Location:  Inpatient Procedure: 2D Echo, Cardiac Doppler and Color Doppler Indications:    acute systolic chf  History:        Patient has prior history of Echocardiogram examinations, most                 recent 02/04/2022. Cardiomyopathy, Pacemaker, COPD and chronic                 kidney disease; Risk Factors:Diabetes, Hypertension and Sleep                 Apnea.  Sonographer:    William Villanueva RDCS Referring Phys: [id number] William Villanueva IMPRESSIONS  1. LV systolic function is severely depressed with diffuse hypokinesis; inferior, septal and apical akinesis. COmpared to echo from Sept 2023, LVEF is worse . Left ventricular ejection fraction, by estimation, is <20%. The left ventricle has severely decreased function. The left ventricle demonstrates global hypokinesis. The left ventricular internal cavity size was severely dilated.  2. Right ventricular systolic function is severely reduced. The right ventricular size is normal. There is moderately elevated pulmonary artery systolic pressure.  3. Left atrial size was moderately dilated.  4. Right atrial size was moderately dilated.  5. Trivial mitral valve regurgitation.  6. The aortic valve is normal in structure. Aortic valve regurgitation is not visualized.  7. The inferior vena cava is dilated in size with <50% respiratory variability, suggesting right atrial pressure of 15 mmHg. FINDINGS  Left Ventricle: LV systolic function is severely depressed with  diffuse hypokinesis; inferior, septal and apical akinesis. COmpared to echo from Sept  and received 1 dose of IV ceftriaxone.  X-ray of the shoulder was obtained demonstrated mild glenohumeral joint osteoarthritis and moderate AC joint osteoarthritis.   Labs reviewed 10/2021 Uric acid 9.7 eGFR 59   No Rheumatology ROS completed.   PMFS History:  Patient Active Problem List   Diagnosis Date Noted   Acute on chronic combined systolic and diastolic CHF (congestive heart failure) (HCC) 01/11/2023   Acute on chronic combined systolic (congestive) and diastolic (congestive) heart failure (HCC) 01/10/2023   CHF (congestive heart failure) (HCC) 01/08/2023    Acute on chronic systolic CHF (congestive heart failure) (HCC) 12/21/2022   CKD stage 3a, GFR 45-59 ml/min (HCC) 12/21/2022   Chronic respiratory failure with hypoxia (HCC) - prn 2 L/min home O2 12/21/2022   Bilateral pleural effusion 12/21/2022   Increased frequency of urination 10/09/2022   Nevus of scalp 10/09/2022   Rheumatoid arthritis (HCC) 02/23/2022   High risk medication use 02/23/2022   COPD (chronic obstructive pulmonary disease) (HCC) 02/10/2022   Class 2 obesity 02/08/2022   DM2 (diabetes mellitus, type 2) (HCC) 02/04/2022   Nocturnal hypoxemia 12/30/2021   Non-ischemic cardiomyopathy (HCC) 07/29/2020   Postural dizziness with presyncope 02/18/2020   Ulcerative colitis, acute, unspecified complication (HCC) 02/18/2020   Thrombocytosis 02/12/2020   Sprain of left wrist 08/12/2019   Depression 04/05/2016   Kidney stones 04/05/2016   Osteoarthritis 04/05/2016   OSA (obstructive sleep apnea) 04/05/2016   Bilateral carpal tunnel syndrome 12/02/2015   Chronic combined systolic and diastolic congestive heart failure (HCC) 04/13/2015   Orthostatic hypotension 01/19/2015   Obesity 11/26/2014   Enlarged prostate without lower urinary tract symptoms (luts) 10/29/2014   Essential hypertension 10/29/2014   GERD (gastroesophageal reflux disease) 10/29/2014   Panic attack 10/29/2014   Changing skin lesion 10/16/2014   Renal cyst 09/02/2014   Dyspnea on exertion 06/21/2014   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   Palpitations 06/11/2014   Myofascial pain 05/28/2014   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 Allure Quadra pulse generator [id number], model PM 3242   Back pain    Cardiomyopathy (HCC)    Nonischemic 45%.    CHF (congestive heart failure) (HCC)    Diabetes mellitus without complication (HCC)    Fracture of lumbar vertebra without spinal cord injury, sequela 10/09/2022   Gout    Hemochromatosis    Hypertension    Hypospadias 1950-09-18   born with   Nephrolithiasis    Pacemaker lead malfunction-elevated threshold RV lead 07/31/2014   Polycythemia vera(238.4)     Family History  Problem Relation Age of Onset   Stroke Mother    Other Father        Deceased, car fell on him   Diabetes Sister    Hypertension Sister    Heart disease Maternal Grandmother        Pacemaker, MI   Past Surgical History:  Procedure Laterality Date   ANKLE SURGERY     BI-VENTRICULAR PACEMAKER INSERTION  on insulin at home, will use sliding scale here and adjust accordingly. William Lemma, MD   DG Chest 2 View  Result Date: 01/08/2023 CLINICAL DATA:  Shortness of breath EXAM: CHEST - 2 VIEW COMPARISON:  12/21/2022 FINDINGS: Mild hyperinflation. Pacer/ICD. Midline trachea. Mild cardiomegaly. Trace bilateral pleural fluid or thickening. Pulmonary venous congestion is slightly increased. No lobar consolidation. Remote lower left rib fractures. IMPRESSION: Cardiomegaly with slight increase in mild pulmonary venous congestion. Probable trace pleural fluid or thickening bilaterally. Electronically Signed   By: William Villanueva M.D.   On: 01/08/2023 19:00   DG Hand Complete Left  Result Date: 12/23/2022 CLINICAL DATA:  Pain EXAM: LEFT HAND - COMPLETE 3+ VIEW COMPARISON:  None Available. FINDINGS: There is no evidence of fracture or dislocation. None mild degenerative changes are noted predominantly involving the DIP joints, basilar joint and radiocarpal joint. Soft tissues are unremarkable. IMPRESSION: 1. No acute findings. 2.  Mild osteoarthritis. Electronically Signed   By: William Villanueva M.D.   On: 12/23/2022 11:50   ECHOCARDIOGRAM COMPLETE  Result Date: 12/22/2022    ECHOCARDIOGRAM REPORT   Patient Name:   William Villanueva Date of Exam: 12/22/2022 Medical Rec #:  [id number]        Height:       68.0 in Accession #:    [id number]       Weight:       244.5 lb Date of Birth:  11-Apr-1951         BSA:          2.226 m Patient Age:    72 years         BP:           110/73 mmHg Patient Gender: M                HR:           90 bpm. Exam Location:  Inpatient Procedure: 2D Echo, Cardiac Doppler and Color Doppler Indications:    acute systolic chf  History:        Patient has prior history of Echocardiogram examinations, most                 recent 02/04/2022. Cardiomyopathy, Pacemaker, COPD and chronic                 kidney disease; Risk Factors:Diabetes, Hypertension and Sleep                 Apnea.  Sonographer:    William Villanueva RDCS Referring Phys: [id number] William Villanueva IMPRESSIONS  1. LV systolic function is severely depressed with diffuse hypokinesis; inferior, septal and apical akinesis. COmpared to echo from Sept 2023, LVEF is worse . Left ventricular ejection fraction, by estimation, is <20%. The left ventricle has severely decreased function. The left ventricle demonstrates global hypokinesis. The left ventricular internal cavity size was severely dilated.  2. Right ventricular systolic function is severely reduced. The right ventricular size is normal. There is moderately elevated pulmonary artery systolic pressure.  3. Left atrial size was moderately dilated.  4. Right atrial size was moderately dilated.  5. Trivial mitral valve regurgitation.  6. The aortic valve is normal in structure. Aortic valve regurgitation is not visualized.  7. The inferior vena cava is dilated in size with <50% respiratory variability, suggesting right atrial pressure of 15 mmHg. FINDINGS  Left Ventricle: LV systolic function is severely depressed with  diffuse hypokinesis; inferior, septal and apical akinesis. COmpared to echo from Sept  on insulin at home, will use sliding scale here and adjust accordingly. William Lemma, MD   DG Chest 2 View  Result Date: 01/08/2023 CLINICAL DATA:  Shortness of breath EXAM: CHEST - 2 VIEW COMPARISON:  12/21/2022 FINDINGS: Mild hyperinflation. Pacer/ICD. Midline trachea. Mild cardiomegaly. Trace bilateral pleural fluid or thickening. Pulmonary venous congestion is slightly increased. No lobar consolidation. Remote lower left rib fractures. IMPRESSION: Cardiomegaly with slight increase in mild pulmonary venous congestion. Probable trace pleural fluid or thickening bilaterally. Electronically Signed   By: William Villanueva M.D.   On: 01/08/2023 19:00   DG Hand Complete Left  Result Date: 12/23/2022 CLINICAL DATA:  Pain EXAM: LEFT HAND - COMPLETE 3+ VIEW COMPARISON:  None Available. FINDINGS: There is no evidence of fracture or dislocation. None mild degenerative changes are noted predominantly involving the DIP joints, basilar joint and radiocarpal joint. Soft tissues are unremarkable. IMPRESSION: 1. No acute findings. 2.  Mild osteoarthritis. Electronically Signed   By: William Villanueva M.D.   On: 12/23/2022 11:50   ECHOCARDIOGRAM COMPLETE  Result Date: 12/22/2022    ECHOCARDIOGRAM REPORT   Patient Name:   William Villanueva Date of Exam: 12/22/2022 Medical Rec #:  [id number]        Height:       68.0 in Accession #:    [id number]       Weight:       244.5 lb Date of Birth:  11-Apr-1951         BSA:          2.226 m Patient Age:    72 years         BP:           110/73 mmHg Patient Gender: M                HR:           90 bpm. Exam Location:  Inpatient Procedure: 2D Echo, Cardiac Doppler and Color Doppler Indications:    acute systolic chf  History:        Patient has prior history of Echocardiogram examinations, most                 recent 02/04/2022. Cardiomyopathy, Pacemaker, COPD and chronic                 kidney disease; Risk Factors:Diabetes, Hypertension and Sleep                 Apnea.  Sonographer:    William Villanueva RDCS Referring Phys: [id number] William Villanueva IMPRESSIONS  1. LV systolic function is severely depressed with diffuse hypokinesis; inferior, septal and apical akinesis. COmpared to echo from Sept 2023, LVEF is worse . Left ventricular ejection fraction, by estimation, is <20%. The left ventricle has severely decreased function. The left ventricle demonstrates global hypokinesis. The left ventricular internal cavity size was severely dilated.  2. Right ventricular systolic function is severely reduced. The right ventricular size is normal. There is moderately elevated pulmonary artery systolic pressure.  3. Left atrial size was moderately dilated.  4. Right atrial size was moderately dilated.  5. Trivial mitral valve regurgitation.  6. The aortic valve is normal in structure. Aortic valve regurgitation is not visualized.  7. The inferior vena cava is dilated in size with <50% respiratory variability, suggesting right atrial pressure of 15 mmHg. FINDINGS  Left Ventricle: LV systolic function is severely depressed with  diffuse hypokinesis; inferior, septal and apical akinesis. COmpared to echo from Sept  on insulin at home, will use sliding scale here and adjust accordingly. William Lemma, MD   DG Chest 2 View  Result Date: 01/08/2023 CLINICAL DATA:  Shortness of breath EXAM: CHEST - 2 VIEW COMPARISON:  12/21/2022 FINDINGS: Mild hyperinflation. Pacer/ICD. Midline trachea. Mild cardiomegaly. Trace bilateral pleural fluid or thickening. Pulmonary venous congestion is slightly increased. No lobar consolidation. Remote lower left rib fractures. IMPRESSION: Cardiomegaly with slight increase in mild pulmonary venous congestion. Probable trace pleural fluid or thickening bilaterally. Electronically Signed   By: William Villanueva M.D.   On: 01/08/2023 19:00   DG Hand Complete Left  Result Date: 12/23/2022 CLINICAL DATA:  Pain EXAM: LEFT HAND - COMPLETE 3+ VIEW COMPARISON:  None Available. FINDINGS: There is no evidence of fracture or dislocation. None mild degenerative changes are noted predominantly involving the DIP joints, basilar joint and radiocarpal joint. Soft tissues are unremarkable. IMPRESSION: 1. No acute findings. 2.  Mild osteoarthritis. Electronically Signed   By: William Villanueva M.D.   On: 12/23/2022 11:50   ECHOCARDIOGRAM COMPLETE  Result Date: 12/22/2022    ECHOCARDIOGRAM REPORT   Patient Name:   William Villanueva Date of Exam: 12/22/2022 Medical Rec #:  [id number]        Height:       68.0 in Accession #:    [id number]       Weight:       244.5 lb Date of Birth:  11-Apr-1951         BSA:          2.226 m Patient Age:    72 years         BP:           110/73 mmHg Patient Gender: M                HR:           90 bpm. Exam Location:  Inpatient Procedure: 2D Echo, Cardiac Doppler and Color Doppler Indications:    acute systolic chf  History:        Patient has prior history of Echocardiogram examinations, most                 recent 02/04/2022. Cardiomyopathy, Pacemaker, COPD and chronic                 kidney disease; Risk Factors:Diabetes, Hypertension and Sleep                 Apnea.  Sonographer:    William Villanueva RDCS Referring Phys: [id number] William Villanueva IMPRESSIONS  1. LV systolic function is severely depressed with diffuse hypokinesis; inferior, septal and apical akinesis. COmpared to echo from Sept 2023, LVEF is worse . Left ventricular ejection fraction, by estimation, is <20%. The left ventricle has severely decreased function. The left ventricle demonstrates global hypokinesis. The left ventricular internal cavity size was severely dilated.  2. Right ventricular systolic function is severely reduced. The right ventricular size is normal. There is moderately elevated pulmonary artery systolic pressure.  3. Left atrial size was moderately dilated.  4. Right atrial size was moderately dilated.  5. Trivial mitral valve regurgitation.  6. The aortic valve is normal in structure. Aortic valve regurgitation is not visualized.  7. The inferior vena cava is dilated in size with <50% respiratory variability, suggesting right atrial pressure of 15 mmHg. FINDINGS  Left Ventricle: LV systolic function is severely depressed with  diffuse hypokinesis; inferior, septal and apical akinesis. COmpared to echo from Sept

## 2023-01-18 NOTE — Progress Notes (Signed)
Cardiology Clinic Note   Date: 01/19/2023 ID: [name], DOB [birth year], MRN [id number]  Primary Cardiologist:  Olga Millers, MD  Patient Profile    William Villanueva is a 72 y.o. male who presents to the clinic today for hospital follow up.     Past medical history significant for: Nonobstructive CAD. R/LHC 01/10/2023: Angiographically normal coronary arteries with large caliber PDA and PL system and large OM branches and diagonal branches but diminutive LAD after D3. 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 PPM generator change out to ICD, lead insertion 04/28/2022. Remote device check 10/31/2022: Normal device function. Battery status good. Lead measurements unchanged.  Echo 12/22/2022: EF <20%.  Diffuse hypokinesis.  Inferior, septal and apical akinesis.  Severely reduced RV function.  Moderately elevated PA pressure.  Moderate BAE. R/LHC 01/10/2023: Moderate pulmonary hypertension due to venous congestion from LV failure. Hemodynamics: RAP 20 mmHg, RVP having EDP 49/19-22 mmHg; PAP-mean 48/36-38 mmHg, PCWP average 30 to 34 mmHg; LV P-EDP 120/20 1-31,000,000 recreate, AOP-MAP 123/76-96 mmHg. Ao sat 87%, PA sat 51% -> Fick cardiac output-index 6.25-2.80; TD 5.35-2.4.  Recommendation for admission for IV diuresis and titration of heart failure medications. Heart block. Hypertension. Hyperlipidemia. CKD stage IIIa. T2DM. GERD. COPD. OSA.      History of Present Illness    William Villanueva is a longtime patient of cardiology followed by Dr. Jens Som and Dr. Graciela Husbands for the above outlined history. In summary, patient was first evaluated by Dr. Jens Som in August 2013 with complaints of chest pain. Echo showed EF 35 to 40%. Cardiac catheterization September 2013 showed mild nonobstructive CAD with no hemodynamic evidence of restriction. Cardiac MRI September 2013 showed EF 34% with diffuse  hypokinesis, no hyperenhancement or scar tissue and no evidence of cardiac hemochromatosis. Patient was admitted December 2015 with high degree AV block. Subsequently had CRT-P placed. Nuclear study March 2017 showed EF 32%, prior infarct but no ischemia with medical treatment as felt likely attenuation artifact. Chest CT August 2023 showed small bilateral pleural effusions, pulmonary artery enlargement, emphysema, coronary atherosclerosis. Echo September 2023 showed EF 40%, Grade II DD, mild RVH, severe LAE, mild RAE, mild MR. Patient's device upgraded to CRT-D December 2023 after symptomatic high degree AV block noted.   Patient presented to the ED on 12/21/2022 for increasing shortness of breath and chest tightness worsened with exertion. He also reported 2 week history of dry cough. He reported a 20 lb weight gain over a week with lower extremity edema, orthopnea and PND. Troponin 62>>69. BNP 889.4. Chest xray showed volume overload. EKG showed v-paced rhythm without clear ischemic changes. He received 80 mg IV Lasix. Patient continued on home carvedilol and spironolactone. He was intolerant to Entresto secondary to hypotension, ACE/ARB secondary to orthostasis. Cannot take SGLT2 secondary to risk of GU infections. Patient was transitioned from IV Lasix to torsemide 20 mg bid with additional 20 mg with weight gain. Patient was discharged on 12/24/2022.   Patient was added onto my schedule as an acute visit on 12/26/2022 with complaints of 8 pound weight gain and increased shortness of breath.  He denied increased orthopnea able to sleep in bed with head elevated on 1 pillow.  Case discussed with Dr. Jens Som who was in agreement with plan to increase torsemide to 40 mg twice daily with close follow-up.  Patient was last seen in the office by Juanda Crumble, PA-C on 01/03/2023 for follow-up after increase in torsemide.  Patient reported a  reduction in weight after increase of torsemide but not back to  baseline.  Based weight 225 to 230 pounds.  He continued complaining of dyspnea with minimal exertion he reported extreme fatigue, weakness and lack of appetite.  He was scheduled for [facility] for further evaluation and referred to advanced heart failure clinic.  Patient presented to the ED on 01/08/2023 with complaints of chills, generalized weakness, cough, nausea/vomiting/diarrhea x 2 days.  He reported family members with COVID.  Respiratory panel was negative, BNP 1183.7 (up from 889.4 on 12/21/2022).  Chest x-ray showed slight increase in mild pulmonary venous congestion.  Bedside ultrasound was consistent with volume overload.  He was diuresed with IV Lasix.  Plan was for admission however patient was feeling improved and decided to be discharged.  Patient underwent R/LHC on 01/10/2023 which showed severely elevated filling pressures consistent with acute on chronic combined systolic and diastolic heart failure.  Patient was admitted for diuresis with IV Lasix.  Patient down 4 L since admission.  He was euvolemic upon discharge and instructed to take torsemide 40 mg daily with extra tablet as needed.  Carvedilol increased to 6.25 mg twice daily.  He was started on Jardiance and spironolactone 12.5 mg daily.  Plan for outpatient AHF clinic and evaluation by EP for CRT-LV lead optimization.  He was discharged on 01/12/2023.  Patient was evaluated by Francis Dowse, PA-C on 01/15/2023.  CRT-D optimized.  QRS reduced from 136 > 122 MS.  PVCs not eliminated but did seem to be reduced in frequency.  Today, patient is accompanied by his wife. He reports improvement in shortness of breath since getting out of the hospital. He has no lower extremity edema, orthopnea or PND. Home weight has been stable. He was up only 2 lb overnight today. He has not needed extra Torsemide. He reports brisk diuresis on current dose of torsemide. His biggest complaint today is generalized weakness. When he gets up to go to the bathroom or try  to do anything he feels like he has to stop and rest. This has been going on since before his hospital admission in August. He has been hypotensive. Home BP this morning before medications 107/60. At PCP today 83/50 with second reading of SBP 77. Patient is very frustrated and feels that nothing is being done to help him. Discussed the nature of heart failure and the necessity for frequent medication titrations.      ROS: All other systems reviewed and are otherwise negative except as noted in History of Present Illness.  Studies Reviewed    EKG Interpretation Date/Time:  Friday January 19 2023 13:33:40 EDT Ventricular Rate:  74 PR Interval:  180 QRS Duration:  118 QT Interval:  454 QTC Calculation: 503 R Axis:   -75  Text Interpretation: Atrial-sensed ventricular-paced rhythm When compared with ECG of 11-Jan-2023 12:07, Premature ventricular complexes are no longer Present Vent. rate has decreased BY  25 BPM Confirmed by Carlos Levering [phone]) on 01/19/2023 1:40:14 PM           Physical Exam    VS:  BP (!) 90/56   Pulse 74   Ht 5\' 10"  (1.778 m)   Wt 230 lb 12.8 oz (104.7 kg)   SpO2 94%   BMI 33.12 kg/m  , BMI Body mass index is 33.12 kg/m.  GEN: Well nourished, well developed, in no acute distress. Neck: No JVD or carotid bruits. Cardiac:  RRR. No extrasystole. No murmurs. No rubs or gallops.   Respiratory:  Respirations regular and unlabored. Clear to auscultation without rales, wheezing or rhonchi. GI: Soft, nontender, nondistended. Extremities: Radials/DP/PT 2+ and equal bilaterally. No clubbing or cyanosis. No edema.  Skin: Warm and dry, no rash. Neuro: Strength intact.  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  S/p CRT-D December 2023.  Echo August 2024 showed EF <20%, diffuse hypokinesis, inferior/septal/apical akinesis, severely reduced RV function, moderately elevated PA pressure, moderate BAE.   [facility] September 2024 showed severely elevated filling pressures consistent with acute on chronic combined systolic and diastolic heart failure.  Patient admitted for IV diuresis.  At time of discharge he was euvolemic and down 4 L since admission.  CRT-D optimized on 01/15/2023 with QRS reduced from 136 > 122 MS.  PVCs not eliminated but did seem reduced in frequency.  Patient reports improvement in shortness of breath. No lower extremity edema, orthopnea or PND. Euvolemic and well compensated on exam. EKG today shows a-sensed, v-paced rhythm, 74 bpm with no ectopy.  Continue torsemide, spironolactone, Jardiance, carvedilol. Nonobstructive CAD.  Per angiography September 2024. Patient denies chest pain, pressure, tightness. Continue carvedilol. Hypotension/generalized weakness. BP today 90/56. Patient denies lightheadedness or dizziness. BP at home has been running low since getting out of the hospital. This morning is was 170/60 prior to taking medications. At PCP it was 83/50 and on repeat SBP was 77. Will decrease carvedilol to 3125 mg bid.   Continue spironolactone. Continue to monitor at home. Contact the office if persistently low.   Disposition: Keep follow up with Dr. Graciela Husbands in December. Return in 4 months or sooner as needed.          Signed, Etta Grandchild. Symeon Puleo, DNP, NP-C

## 2023-01-19 ENCOUNTER — Encounter: Payer: Self-pay | Admitting: Student

## 2023-01-19 ENCOUNTER — Ambulatory Visit: Payer: PPO | Attending: Student | Admitting: Student

## 2023-01-19 VITALS — BP 90/56 | HR 74 | Ht 70.0 in | Wt 230.8 lb

## 2023-01-19 DIAGNOSIS — I959 Hypotension, unspecified: Secondary | ICD-10-CM

## 2023-01-19 DIAGNOSIS — I5042 Chronic combined systolic (congestive) and diastolic (congestive) heart failure: Secondary | ICD-10-CM | POA: Diagnosis not present

## 2023-01-19 DIAGNOSIS — I428 Other cardiomyopathies: Secondary | ICD-10-CM | POA: Diagnosis not present

## 2023-01-19 DIAGNOSIS — K529 Noninfective gastroenteritis and colitis, unspecified: Secondary | ICD-10-CM | POA: Diagnosis not present

## 2023-01-19 DIAGNOSIS — Z95810 Presence of automatic (implantable) cardiac defibrillator: Secondary | ICD-10-CM

## 2023-01-19 DIAGNOSIS — Z23 Encounter for immunization: Secondary | ICD-10-CM | POA: Diagnosis not present

## 2023-01-19 DIAGNOSIS — I130 Hypertensive heart and chronic kidney disease with heart failure and stage 1 through stage 4 chronic kidney disease, or unspecified chronic kidney disease: Secondary | ICD-10-CM | POA: Diagnosis not present

## 2023-01-19 DIAGNOSIS — R031 Nonspecific low blood-pressure reading: Secondary | ICD-10-CM | POA: Diagnosis not present

## 2023-01-19 DIAGNOSIS — R5381 Other malaise: Secondary | ICD-10-CM | POA: Diagnosis not present

## 2023-01-19 DIAGNOSIS — E1122 Type 2 diabetes mellitus with diabetic chronic kidney disease: Secondary | ICD-10-CM | POA: Diagnosis not present

## 2023-01-19 DIAGNOSIS — Z9989 Dependence on other enabling machines and devices: Secondary | ICD-10-CM | POA: Diagnosis not present

## 2023-01-19 DIAGNOSIS — R531 Weakness: Secondary | ICD-10-CM | POA: Diagnosis not present

## 2023-01-19 DIAGNOSIS — N1831 Chronic kidney disease, stage 3a: Secondary | ICD-10-CM | POA: Diagnosis not present

## 2023-01-19 DIAGNOSIS — Z794 Long term (current) use of insulin: Secondary | ICD-10-CM | POA: Diagnosis not present

## 2023-01-19 NOTE — Patient Instructions (Signed)
Medication Instructions:  DECREASE THE CARVEDILOL TO 3.125MG . TAKE ONE HALF TABLET TWICE DAILY. *If you need a refill on your cardiac medications before your next appointment, please call your pharmacy*   Lab Work: NONE If you have labs (blood work) drawn today and your tests are completely normal, you will receive your results only by: MyChart Message (if you have MyChart) OR A paper copy in the mail If you have any lab test that is abnormal or we need to change your treatment, we will call you to review the results.   Testing/Procedures: NONE   Follow-Up: At [facility], you and your health needs are our priority.  As part of our continuing mission to provide you with exceptional heart care, we have created designated Provider Care Teams.  These Care Teams include your primary Cardiologist (physician) and Advanced Practice Providers (APPs -  Physician Assistants and Nurse Practitioners) who all work together to provide you with the care you need, when you need it.  We recommend signing up for the patient portal called "MyChart".  Sign up information is provided on this After Visit Summary.  MyChart is used to connect with patients for Virtual Visits (Telemedicine).  Patients are able to view lab/test results, encounter notes, upcoming appointments, etc.  Non-urgent messages can be sent to your provider as well.   To learn more about what you can do with MyChart, go to [email or web address].    Your next appointment:   4 month(s)  Provider:   Olga Millers, MD

## 2023-01-22 ENCOUNTER — Ambulatory Visit: Payer: PPO | Admitting: Internal Medicine

## 2023-01-22 DIAGNOSIS — Z5181 Encounter for therapeutic drug level monitoring: Secondary | ICD-10-CM

## 2023-01-22 DIAGNOSIS — M1A09X0 Idiopathic chronic gout, multiple sites, without tophus (tophi): Secondary | ICD-10-CM

## 2023-01-22 DIAGNOSIS — M159 Polyosteoarthritis, unspecified: Secondary | ICD-10-CM

## 2023-01-24 NOTE — Progress Notes (Unsigned)
Office Visit Note  Patient: William Villanueva             Date of Birth: 17-Mar-1951           MRN: [id number]             PCP: Tally Joe, MD Referring: Tally Joe, MD Visit Date: 01/25/2023   Subjective:  No chief complaint on file.   History of Present Illness: William Villanueva is a 72 y.o. male here for follow up ***   Previous HPI 08/28/22 William Villanueva is a 72 y.o. male here for follow up for rheumatoid arthritis and gout on allopurinol 200 mg daily.  He felt a lot of benefit with the joint steroid injections from previous visit his right hand and left shoulder.  The shoulder remains largely improved although he still has mild symptoms there.  Right hand is giving him more trouble again especially at the thumb he is using a brace that is partially helpful.  No new definite gout flareups of inflammation.     Previous HPI 04/07/22 William Villanueva is a 72 y.o. male here for follow up for rheumatoid arthritis and gout currently on allopurinol increased to 200 mg daily after our initial visit. He continues having joint pain and stiffness at multiple areas. Most problematic are his right thumb and his left shoulder. Swelling stays mostly at his feet and ankles. He got sick with RSV and delayed his planned pacemaker battery replacement so that is coming up. Still has a bit of increased cough mostly nonproductive.    Previous HPI 02/23/22 William Villanueva is a 72 y.o. male here for rheumatoid arthritis. He was previously seeing [facility] rheumatology on treatment with methotrexate and had been on allopurinol and uloric for gout. He has history of carpal tunnel syndrome with surgical treatment for the left wrist.  Original diagnosis was RA he had joint pain in multiple areas with abnormal laboratory test.  He was started on injectable methotrexate but switched to oral methotrexate due to pain and difficulty with the injections.  He stopped following up with her clinic due to  dissatisfaction including never seeing his MD and  not addressing any complaints not directly attributable to his inflammatory arthritis.  As result he has been off any disease specific medication for over a year does feel he has increased in joint pains off of treatment.  He had also been off of the Uloric they were prescribing but was restarted on allopurinol in the past few months with his PCP office.  He reports 1 flare of gout in his foot within the past month but before that had been free of any attacks for several months duration.  He still has residual swelling and erythema in the left foot but was told this may be coming from his heart failure edema as well.   Generally has daily joint pain in multiple areas particular including bilateral hands and in his back.  He gets foot and ankle pain most severe during gout flares has mild symptoms otherwise.  More recently also developed increased left shoulder pain for several days duration for which he went to urgent care for evaluation of this but ended up going to the hospital due to hypoxia and increased cough so the original problem was never addressed.  Treatment course at the hospital for congestive heart failure exacerbation and improved after several days.  He still had increased cough producing significant discolored sputum and received 1 dose of IV ceftriaxone.  X-ray of  the shoulder was obtained demonstrated mild glenohumeral joint osteoarthritis and moderate AC joint osteoarthritis.   Labs reviewed 10/2021 Uric acid 9.7 eGFR 59   No Rheumatology ROS completed.   PMFS History:  Patient Active Problem List   Diagnosis Date Noted   Acute on chronic combined systolic and diastolic CHF (congestive heart failure) (HCC) 01/11/2023   Acute on chronic combined systolic (congestive) and diastolic (congestive) heart failure (HCC) 01/10/2023   CHF (congestive heart failure) (HCC) 01/08/2023   Acute on chronic systolic CHF (congestive heart failure)  (HCC) 12/21/2022   CKD stage 3a, GFR 45-59 ml/min (HCC) 12/21/2022   Chronic respiratory failure with hypoxia (HCC) - prn 2 L/min home O2 12/21/2022   Bilateral pleural effusion 12/21/2022   Increased frequency of urination 10/09/2022   Nevus of scalp 10/09/2022   Rheumatoid arthritis (HCC) 02/23/2022   High risk medication use 02/23/2022   COPD (chronic obstructive pulmonary disease) (HCC) 02/10/2022   Class 2 obesity 02/08/2022   DM2 (diabetes mellitus, type 2) (HCC) 02/04/2022   Nocturnal hypoxemia 12/30/2021   Non-ischemic cardiomyopathy (HCC) 07/29/2020   Postural dizziness with presyncope 02/18/2020   Ulcerative colitis, acute, unspecified complication (HCC) 02/18/2020   Thrombocytosis 02/12/2020   Sprain of left wrist 08/12/2019   Depression 04/05/2016   Kidney stones 04/05/2016   Osteoarthritis 04/05/2016   OSA (obstructive sleep apnea) 04/05/2016   Bilateral carpal tunnel syndrome 12/02/2015   Chronic combined systolic and diastolic congestive heart failure (HCC) 04/13/2015   Orthostatic hypotension 01/19/2015   Obesity 11/26/2014   Enlarged prostate without lower urinary tract symptoms (luts) 10/29/2014   Essential hypertension 10/29/2014   GERD (gastroesophageal reflux disease) 10/29/2014   Panic attack 10/29/2014   Changing skin lesion 10/16/2014   Renal cyst 09/02/2014   Dyspnea on exertion 06/21/2014   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   Palpitations 06/11/2014   Myofascial pain 05/28/2014   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   Other cardiomyopathies (HCC) 04/29/2014   Chronic pain 02/12/2014   Lumbar radiculopathy 02/09/2014   Thrombocythemia 12/01/2013   Muscular wasting and disuse atrophy 08/15/2013   Degenerative lumbar spinal stenosis 08/06/2013   Lumbar and sacral osteoarthritis 05/19/2013   Congestive dilated cardiomyopathy (HCC) 01/11/2012   Back pain    Hemochromatosis    SOB (shortness of breath)    Gout    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 Jude Allure Quadra pulse generator [id number], model PM 3242   Back pain    Cardiomyopathy (HCC)    Nonischemic 45%.    CHF (congestive heart failure) (HCC)    Diabetes mellitus without complication (HCC)    Fracture of lumbar vertebra without spinal cord injury, sequela 10/09/2022   Gout    Hemochromatosis    Hypertension    Hypospadias [birth year]   born with   Nephrolithiasis    Pacemaker lead malfunction-elevated threshold RV lead 07/31/2014   Polycythemia vera(238.4)     Family History  Problem Relation Age of Onset   Stroke Mother    Other Father        Deceased, car fell on him   Diabetes Sister    Hypertension Sister    Heart disease Maternal Grandmother        Pacemaker, MI   Past Surgical History:  Procedure Laterality Date   ANKLE SURGERY     BI-VENTRICULAR PACEMAKER INSERTION N/A 04/29/2014   Procedure: BI-VENTRICULAR PACEMAKER INSERTION (CRT-P);  Surgeon: Duke Salvia, MD; Laterality: Left  St. Jude Allure Quadra pulse generator [phone], model [place] 4540   CARDIAC SURGERY     COLONOSCOPY N/A 02/15/2020   Procedure: COLONOSCOPY;  Surgeon: Kerin Salen, MD;  Location: [facility] ENDOSCOPY;  Service: Gastroenterology;  Laterality: N/A;   ESOPHAGOGASTRODUODENOSCOPY (EGD) WITH PROPOFOL N/A 02/15/2020   Procedure: ESOPHAGOGASTRODUODENOSCOPY (EGD) WITH PROPOFOL;  Surgeon: Kerin Salen, MD;  Location[facility] ENDOSCOPY;  Service: Gastroenterology;  Laterality: N/A;   LEAD INSERTION N/A 04/28/2022   Procedure: LEAD INSERTION;  Surgeon: Duke Salvia, MD;  Location: [facility] INVASIVE CV LAB;  Service: Cardiovascular;  Laterality: N/A;   PILONIDAL CYST EXCISION     POSTERIOR LAMINECTOMY / DECOMPRESSION LUMBAR SPINE     PPM GENERATOR CHANGEOUT N/A 04/28/2022   Procedure: PPM GENERATOR CHANGEOUT;  Surgeon: Duke Salvia, MD;  Location: [facility] INVASIVE CV LAB;  Service: Cardiovascular;  Laterality: N/A;    RIGHT/LEFT HEART CATH AND CORONARY ANGIOGRAPHY N/A 01/10/2023   Procedure: RIGHT/LEFT HEART CATH AND CORONARY ANGIOGRAPHY;  Surgeon: Marykay Lex, MD;  Location: [facility] INVASIVE CV LAB;  Service: Cardiovascular;  Laterality: N/A;   TONSILLECTOMY     Social History   Social History Narrative   Lives at home with wife in a one story home.  Has no children.  Does not work.  Getting workman's comp.  Education: 4 years trade school.    Immunization History  Administered Date(s) Administered   COVID-19, mRNA, vaccine(Comirnaty)12 years and older 03/02/2022   DTaP 01/26/2016   Fluad Quad(high Dose 65+) 03/07/2021, 03/02/2022   Influenza Split 01/21/2015   Influenza, High Dose Seasonal PF 02/23/2017, 03/22/2018, 01/10/2019, 03/01/2020   Influenza,inj,Quad PF,6+ Mos 01/21/2015, 01/27/2016, 02/05/2017   Influenza-Unspecified 01/26/2016   PFIZER Comirnaty(Gray Top)Covid-19 Tri-Sucrose Vaccine 09/10/2020   PFIZER(Purple Top)SARS-COV-2 Vaccination 06/21/2019, 07/13/2019, 04/16/2020, 09/10/2020   Pfizer Covid-19 Vaccine Bivalent Booster 38yrs & up 03/07/2021   Pneumococcal Conjugate-13 01/27/2016, 01/09/2018   Pneumococcal Polysaccharide-23 01/21/2015   Pneumococcal-Unspecified 01/26/2016   Tdap 01/26/2011, 01/27/2016, 08/09/2019   Zoster, Live 08/29/2019     Objective: Vital Signs: There were no vitals taken for this visit.   Physical Exam   Musculoskeletal Exam: ***  CDAI Exam: CDAI Score: -- Patient Global: --; Provider Global: -- Swollen: --; Tender: -- Joint Exam 01/25/2023   No joint exam has been documented for this visit   There is currently no information documented on the homunculus. Go to the Rheumatology activity and complete the homunculus joint exam.  Investigation: No additional findings.  Imaging: CARDIAC CATHETERIZATION  Result Date: 01/10/2023 POST-CATH DIAGNOSES Nonischemic cardiomyopathy with angiographically normal coronary arteries with large caliber PDA and PL  system and large OM branches and diagonal branches but diminutive LAD after D3. Moderate Pulmonary Hypertension due to venous congestion from LV failure: Hemodynamics: RAP 20 mmHg, RVP having EDP 49/19-22 mmHg; PAP-mean 48/36-38 mmHg, PCWP average 30 to 34 mmHg; LV P-EDP 120/20 1-31,000,000 recreate, AOP-MAP 123/76-96 mmHg Ao sat 87%, PA sat 51% -> Fick cardiac output-index 6.25-2.80; TD 5.35-2.4 RECOMMENDATIONS WITH recent ER visit for heart failure and severely elevated filling pressures symptoms are consistent with acute on chronic combined systolic and diastolic heart failure and I think the best method of treatment is admission for IV diuresis and titration of heart failure medications. I have written for 80 mg IV twice daily Lasix with 80 mg given here in the Cath Lab Consider converting to Entresto plus or minus SGLT2 interpreter. Was on insulin at home, will use sliding scale here and  adjust accordingly. Bryan Lemma, MD   DG Chest 2 View  Result Date: 01/08/2023 CLINICAL DATA:  Shortness of breath EXAM: CHEST - 2 VIEW COMPARISON:  12/21/2022 FINDINGS: Mild hyperinflation. Pacer/ICD. Midline trachea. Mild cardiomegaly. Trace bilateral pleural fluid or thickening. Pulmonary venous congestion is slightly increased. No lobar consolidation. Remote lower left rib fractures. IMPRESSION: Cardiomegaly with slight increase in mild pulmonary venous congestion. Probable trace pleural fluid or thickening bilaterally. Electronically Signed   By: Jeronimo Greaves M.D.   On: 01/08/2023 19:00    Recent Labs: Lab Results  Component Value Date   WBC 6.9 01/10/2023   HGB 10.3 (L) 01/10/2023   PLT 605 (H) 01/10/2023   NA 136 01/12/2023   K 4.1 01/12/2023   CL 97 (L) 01/12/2023   CO2 24 01/12/2023   GLUCOSE 97 01/12/2023   BUN 33 (H) 01/12/2023   CREATININE 1.98 (H) 01/12/2023   BILITOT 1.2 01/12/2023   ALKPHOS 69 01/12/2023   AST 17 01/12/2023   ALT 9 01/12/2023   PROT 7.1 01/12/2023   ALBUMIN 3.3 (L)  01/12/2023   CALCIUM 9.2 01/12/2023   GFRAA 83 05/10/2020    Speciality Comments: No specialty comments available.  Procedures:  No procedures performed Allergies: Advil [ibuprofen], Keflex [cephalexin], Wellbutrin [bupropion], Prednisone, Temazepam, Trazodone and nefazodone, Sacubitril-valsartan, and Prozac [fluoxetine hcl]   Assessment / Plan:     Visit Diagnoses: No diagnosis found.  ***  Orders: No orders of the defined types were placed in this encounter.  No orders of the defined types were placed in this encounter.    Follow-Up Instructions: No follow-ups on file.   Fuller Plan, MD  Note - This record has been created using [employer].  Chart creation errors have been sought, but may not always  have been located. Such creation errors do not reflect on  the standard of medical care.

## 2023-01-25 ENCOUNTER — Encounter: Payer: Self-pay | Admitting: Internal Medicine

## 2023-01-25 ENCOUNTER — Ambulatory Visit: Payer: PPO | Attending: Internal Medicine | Admitting: Internal Medicine

## 2023-01-25 VITALS — BP 103/69 | HR 87 | Resp 16 | Ht 70.0 in | Wt 227.0 lb

## 2023-01-25 DIAGNOSIS — M1A09X0 Idiopathic chronic gout, multiple sites, without tophus (tophi): Secondary | ICD-10-CM | POA: Diagnosis not present

## 2023-01-25 DIAGNOSIS — M1811 Unilateral primary osteoarthritis of first carpometacarpal joint, right hand: Secondary | ICD-10-CM

## 2023-01-25 MED ORDER — LIDOCAINE HCL 1 % IJ SOLN
0.5000 mL | INTRAMUSCULAR | Status: AC | PRN
Start: 2023-01-25 — End: 2023-01-25
  Administered 2023-01-25: .5 mL

## 2023-01-25 MED ORDER — ALLOPURINOL 300 MG PO TABS
300.0000 mg | ORAL_TABLET | Freq: Every day | ORAL | 1 refills | Status: DC
Start: 2023-01-25 — End: 2023-04-18

## 2023-01-25 MED ORDER — TRIAMCINOLONE ACETONIDE 40 MG/ML IJ SUSP
20.0000 mg | INTRAMUSCULAR | Status: AC | PRN
Start: 2023-01-25 — End: 2023-01-25
  Administered 2023-01-25: 20 mg via INTRA_ARTICULAR

## 2023-01-30 ENCOUNTER — Ambulatory Visit (INDEPENDENT_AMBULATORY_CARE_PROVIDER_SITE_OTHER): Payer: PPO

## 2023-01-30 DIAGNOSIS — I5042 Chronic combined systolic (congestive) and diastolic (congestive) heart failure: Secondary | ICD-10-CM

## 2023-01-30 DIAGNOSIS — I428 Other cardiomyopathies: Secondary | ICD-10-CM | POA: Diagnosis not present

## 2023-01-30 LAB — CUP PACEART REMOTE DEVICE CHECK
Battery Remaining Longevity: 59 mo
Battery Remaining Percentage: 84 %
Battery Voltage: 3.02 V
Brady Statistic AP VP Percent: 1 %
Brady Statistic AP VS Percent: 1 %
Brady Statistic AS VP Percent: 94 %
Brady Statistic AS VS Percent: 3.2 %
Brady Statistic RA Percent Paced: 1 %
Date Time Interrogation Session: 20240924020015
HighPow Impedance: 72 Ohm
HighPow Impedance: 72 Ohm
Implantable Lead Connection Status: 753985
Implantable Lead Connection Status: 753985
Implantable Lead Connection Status: 753985
Implantable Lead Implant Date: 20151223
Implantable Lead Implant Date: 20151223
Implantable Lead Implant Date: 20231222
Implantable Lead Location: 753858
Implantable Lead Location: 753859
Implantable Lead Location: 753860
Implantable Lead Model: 7122
Implantable Pulse Generator Implant Date: 20231222
Lead Channel Impedance Value: 430 Ohm
Lead Channel Impedance Value: 460 Ohm
Lead Channel Impedance Value: 630 Ohm
Lead Channel Pacing Threshold Amplitude: 0.75 V
Lead Channel Pacing Threshold Amplitude: 1 V
Lead Channel Pacing Threshold Amplitude: 1.5 V
Lead Channel Pacing Threshold Pulse Width: 0.5 ms
Lead Channel Pacing Threshold Pulse Width: 0.7 ms
Lead Channel Pacing Threshold Pulse Width: 1 ms
Lead Channel Sensing Intrinsic Amplitude: 12 mV
Lead Channel Sensing Intrinsic Amplitude: 2.2 mV
Lead Channel Setting Pacing Amplitude: 2 V
Lead Channel Setting Pacing Amplitude: 2 V
Lead Channel Setting Pacing Amplitude: 3 V
Lead Channel Setting Pacing Pulse Width: 0.5 ms
Lead Channel Setting Pacing Pulse Width: 0.7 ms
Lead Channel Setting Sensing Sensitivity: 0.5 mV
Pulse Gen Serial Number: 5555736

## 2023-02-01 ENCOUNTER — Telehealth: Payer: Self-pay

## 2023-02-01 DIAGNOSIS — I472 Ventricular tachycardia, unspecified: Secondary | ICD-10-CM

## 2023-02-01 MED ORDER — METOPROLOL SUCCINATE ER 25 MG PO TB24: 25.0000 mg | ORAL_TABLET | Freq: Every day | ORAL | 6 refills | Status: DC

## 2023-02-01 NOTE — Telephone Encounter (Addendum)
Alert remote transmission: VT with ATP There was one VT arrhythmia detected that was successfully converted after one burst of ATP, sent to triage ML, CVRS  On 9/25 at 1156 am, there was one 16 sec sustained VT (rate 171bpm) event converted with ATP.    Spoke with patient, he does report feeling fast heart rates, chest pain and SOB during the event.  He did not lose consciousness. Recent hospitalization in last couple of weeks for acute heart failure, fluid retention, has been on increased diuretics of late, with new start on Jardiance.  ER precautions given if sx were to re-occur and/or worsen.  Saw Francis Dowse PA-C on 9/925.    Forwarding to [facility] and Dr. Graciela Husbands for next step recommendations, ? Labs and follow up? Needs.

## 2023-02-01 NOTE — Telephone Encounter (Signed)
Discussed with A. Tillery PA-C.  Spoke with patient and wife given the following instructions:   Lab appt:  02/05/23 at 10am for CMET, TSH, MAG STOP Carvedilol, start metoprolol succinate 25mg  daily See Francis Dowse, PA-C in clinic on 02/15/2023 for follow up.  Reminded patient and wife of ER precautions if sxs return.

## 2023-02-05 ENCOUNTER — Ambulatory Visit: Payer: PPO

## 2023-02-06 ENCOUNTER — Encounter (HOSPITAL_COMMUNITY): Payer: PPO | Admitting: Cardiology

## 2023-02-08 ENCOUNTER — Encounter: Payer: PPO | Admitting: Internal Medicine

## 2023-02-09 ENCOUNTER — Encounter: Payer: Self-pay | Admitting: Hematology & Oncology

## 2023-02-09 ENCOUNTER — Inpatient Hospital Stay: Payer: PPO | Attending: Hematology & Oncology

## 2023-02-09 ENCOUNTER — Other Ambulatory Visit: Payer: Self-pay

## 2023-02-09 ENCOUNTER — Inpatient Hospital Stay: Payer: PPO | Admitting: Hematology & Oncology

## 2023-02-09 VITALS — BP 107/62 | HR 77 | Temp 97.6°F | Resp 19 | Ht 70.0 in | Wt 224.0 lb

## 2023-02-09 DIAGNOSIS — I441 Atrioventricular block, second degree: Secondary | ICD-10-CM | POA: Insufficient documentation

## 2023-02-09 DIAGNOSIS — Z79899 Other long term (current) drug therapy: Secondary | ICD-10-CM | POA: Insufficient documentation

## 2023-02-09 DIAGNOSIS — Z886 Allergy status to analgesic agent status: Secondary | ICD-10-CM | POA: Insufficient documentation

## 2023-02-09 DIAGNOSIS — I5043 Acute on chronic combined systolic (congestive) and diastolic (congestive) heart failure: Secondary | ICD-10-CM | POA: Diagnosis not present

## 2023-02-09 DIAGNOSIS — R634 Abnormal weight loss: Secondary | ICD-10-CM | POA: Diagnosis not present

## 2023-02-09 DIAGNOSIS — Z888 Allergy status to other drugs, medicaments and biological substances status: Secondary | ICD-10-CM | POA: Diagnosis not present

## 2023-02-09 DIAGNOSIS — Z881 Allergy status to other antibiotic agents status: Secondary | ICD-10-CM | POA: Diagnosis not present

## 2023-02-09 DIAGNOSIS — Z7951 Long term (current) use of inhaled steroids: Secondary | ICD-10-CM | POA: Insufficient documentation

## 2023-02-09 DIAGNOSIS — D45 Polycythemia vera: Secondary | ICD-10-CM

## 2023-02-09 LAB — CBC WITH DIFFERENTIAL (CANCER CENTER ONLY)
Abs Immature Granulocytes: 0.14 10*3/uL — ABNORMAL HIGH (ref 0.00–0.07)
Basophils Absolute: 0.2 10*3/uL — ABNORMAL HIGH (ref 0.0–0.1)
Basophils Relative: 2 %
Eosinophils Absolute: 0 10*3/uL (ref 0.0–0.5)
Eosinophils Relative: 0 %
HCT: 38.7 % — ABNORMAL LOW (ref 39.0–52.0)
Hemoglobin: 12 g/dL — ABNORMAL LOW (ref 13.0–17.0)
Immature Granulocytes: 1 %
Lymphocytes Relative: 8 %
Lymphs Abs: 0.8 10*3/uL (ref 0.7–4.0)
MCH: 30.5 pg (ref 26.0–34.0)
MCHC: 31 g/dL (ref 30.0–36.0)
MCV: 98.5 fL (ref 80.0–100.0)
Monocytes Absolute: 0.6 10*3/uL (ref 0.1–1.0)
Monocytes Relative: 6 %
Neutro Abs: 8.1 10*3/uL — ABNORMAL HIGH (ref 1.7–7.7)
Neutrophils Relative %: 83 %
Platelet Count: 445 10*3/uL — ABNORMAL HIGH (ref 150–400)
RBC: 3.93 MIL/uL — ABNORMAL LOW (ref 4.22–5.81)
RDW: 18.1 % — ABNORMAL HIGH (ref 11.5–15.5)
WBC Count: 9.8 10*3/uL (ref 4.0–10.5)
nRBC: 0.3 % — ABNORMAL HIGH (ref 0.0–0.2)

## 2023-02-09 LAB — CMP (CANCER CENTER ONLY)
ALT: 10 U/L (ref 0–44)
AST: 10 U/L — ABNORMAL LOW (ref 15–41)
Albumin: 3.9 g/dL (ref 3.5–5.0)
Alkaline Phosphatase: 58 U/L (ref 38–126)
Anion gap: 9 (ref 5–15)
BUN: 52 mg/dL — ABNORMAL HIGH (ref 8–23)
CO2: 24 mmol/L (ref 22–32)
Calcium: 8.9 mg/dL (ref 8.9–10.3)
Chloride: 103 mmol/L (ref 98–111)
Creatinine: 1.45 mg/dL — ABNORMAL HIGH (ref 0.61–1.24)
GFR, Estimated: 52 mL/min — ABNORMAL LOW (ref >60)
Glucose, Bld: 124 mg/dL — ABNORMAL HIGH (ref 70–99)
Potassium: 4.3 mmol/L (ref 3.5–5.1)
Sodium: 136 mmol/L (ref 135–145)
Total Bilirubin: 0.8 mg/dL (ref 0.3–1.2)
Total Protein: 7 g/dL (ref 6.5–8.1)

## 2023-02-09 LAB — IRON AND IRON BINDING CAPACITY (CC-WL,HP ONLY)
Iron: 38 ug/dL — ABNORMAL LOW (ref 45–182)
Saturation Ratios: 8 % — ABNORMAL LOW (ref 17.9–39.5)
TIBC: 469 ug/dL — ABNORMAL HIGH (ref 250–450)
UIBC: 431 ug/dL — ABNORMAL HIGH (ref 117–376)

## 2023-02-09 LAB — FERRITIN: Ferritin: 18 ng/mL — ABNORMAL LOW (ref 24–336)

## 2023-02-09 LAB — LACTATE DEHYDROGENASE: LDH: 238 U/L — ABNORMAL HIGH (ref 98–192)

## 2023-02-09 NOTE — Progress Notes (Signed)
Hematology and Oncology Follow Up Visit  William Villanueva [id number] 1951-01-17 72 y.o. 02/09/2023   Principle Diagnosis:  Polycythemia vera-JAK2 positive Heart block-Mobitz II  Current Therapy:   Hydrea 500 mg p.o.BID -dose changed on 08/12/2021 Aspirin 81 mg by mouth daily Phlebotomy to maintain hematocrit below 45%     Interim History:  Mr.  William Villanueva is back for followup.  Unfortunately, looks like's been having problems with his heart.  He has nonischemic cardiomyopathy.  He apparently had a cardiac cath that was done about a month ago.  I cannot find where the ejection fraction was monitor mentioned.  However, it says that he has a acute on chronic combined systolic and diastolic heart failure.  He was put on torsemide.  He also is on Toprol XL.  There was a mention in the note about Entresto and one of the new weight loss drugs.  He does feel tired.  He is going to the cardiologist next week.  He feels he needs to have his medications adjusted.  Otherwise, from my point of view, he is doing okay.  He is on Hydrea.  He takes Hydrea twice a day.  His platelet count is nicely maintained..  He has had no fever.  He has had no change in bowel or bladder habits.  He has had no nausea or vomiting.  He has had no leg swelling.  His weight continues to go down from aggressive diuresis.  His blood sugars seem to be doing fairly well.  Currently, I would say that his performance status is probably ECOG 2.   Medications:  Current Outpatient Medications:    acetaminophen (TYLENOL) 500 MG tablet, Take 1,000 mg by mouth every 6 (six) hours as needed for headache., Disp: , Rfl:    allopurinol (ZYLOPRIM) 300 MG tablet, Take 1 tablet (300 mg total) by mouth daily., Disp: 90 tablet, Rfl: 1   Ascorbic Acid (VITAMIN C PO), Take by mouth., Disp: , Rfl:    budesonide (PULMICORT) 0.5 MG/2ML nebulizer solution, Take 2 mLs (0.5 mg total) by nebulization 2 (two) times daily. (RINSE MOUTH AFTER USE), Disp:  360 mL, Rfl: 11   cholestyramine (QUESTRAN) 4 GM/DOSE powder, Take 4 g by mouth daily as needed (diarrhea)., Disp: , Rfl:    citalopram (CELEXA) 10 MG tablet, Take 5 mg by mouth in the morning., Disp: , Rfl:    Cyanocobalamin (B-12 PO), Take 1 tablet by mouth daily., Disp: , Rfl:    diclofenac Sodium (VOLTAREN) 1 % GEL, Apply 2 g topically daily as needed (pain)., Disp: , Rfl:    dicyclomine (BENTYL) 20 MG tablet, Take 20 mg by mouth 2 (two) times daily., Disp: , Rfl:    empagliflozin (JARDIANCE) 10 MG TABS tablet, Take 1 tablet (10 mg total) by mouth daily., Disp: 90 tablet, Rfl: 3   hydroxyurea (HYDREA) 500 MG capsule, TAKE TWO CAPSULES BY MOUTH DAILY, Disp: 160 capsule, Rfl: 4   insulin degludec (TRESIBA FLEXTOUCH) 100 UNIT/ML SOPN FlexTouch Pen, Inject 16 Units into the skin daily after breakfast., Disp: , Rfl:    ipratropium-albuterol (DUONEB) 0.5-2.5 (3) MG/3ML SOLN, Inhale one vial in nebulizer twice daily and in between if needed, Disp: , Rfl:    magnesium gluconate (MAGONATE) 500 MG tablet, Take 500 mg by mouth in the morning and at bedtime., Disp: , Rfl:    metoprolol succinate (TOPROL XL) 25 MG 24 hr tablet, Take 1 tablet (25 mg total) by mouth daily., Disp: 30 tablet, Rfl: 6  naphazoline-glycerin (CLEAR EYES REDNESS) 0.012-0.25 % SOLN, Place 1-2 drops into both eyes daily as needed for eye irritation., Disp: , Rfl:    omeprazole (PRILOSEC) 20 MG capsule, Take 20 mg by mouth in the morning and at bedtime., Disp: , Rfl:    oxybutynin (DITROPAN XL) 15 MG 24 hr tablet, Take 15 mg by mouth in the morning., Disp: , Rfl:    sodium bicarbonate 650 MG tablet, Take 1 tablet (650 mg total) by mouth 2 (two) times daily., Disp: 60 tablet, Rfl: 0   spironolactone (ALDACTONE) 25 MG tablet, Take 0.5 tablets (12.5 mg total) by mouth daily., Disp: 45 tablet, Rfl: 3   tamsulosin (FLOMAX) 0.4 MG CAPS capsule, Take 0.4 mg by mouth daily. (Patient not taking: Reported on 01/25/2023), Disp: , Rfl:     Torsemide 40 MG TABS, Take 40 mg by mouth daily. May take extra dose in the afternoon if has increased swelling or if weight goes up by more than 3 lbs, Disp: 90 tablet, Rfl: 1   triamcinolone cream (KENALOG) 0.1 %, Apply 1 Application topically 2 (two) times daily. (Patient taking differently: Apply 1 Application topically daily as needed (Rash).), Disp: 30 g, Rfl: 0   Turmeric (QC TUMERIC COMPLEX) 500 MG CAPS, Take 1 tablet by mouth daily., Disp: , Rfl:   Allergies:  Allergies  Allergen Reactions   Advil [Ibuprofen] Itching   Keflex [Cephalexin] Diarrhea and Other (See Comments)    Caused C-diff, also    Wellbutrin [Bupropion] Hives and Other (See Comments)   Prednisone Itching and Other (See Comments)    Abdominal pain, also    Temazepam Other (See Comments)    Dizziness    Trazodone And Nefazodone Other (See Comments)    Dizziness   Sacubitril-Valsartan Other (See Comments)    Unknown   Prozac [Fluoxetine Hcl] Itching    Past Medical History, Surgical history, Social history, and Family History were reviewed and updated.  Review of Systems: Review of Systems  Constitutional: Negative.   HENT: Negative.    Eyes: Negative.   Respiratory:  Positive for shortness of breath.   Cardiovascular:  Positive for chest pain.  Gastrointestinal: Negative.   Genitourinary: Negative.   Musculoskeletal:  Positive for back pain.  Skin: Negative.   Neurological: Negative.   Endo/Heme/Allergies: Negative.   Psychiatric/Behavioral: Negative.      Physical Exam: Vital signs temperature 97.6.  Pulse 77.  Blood pressure 107/62.  Weight is 224 pounds.  Physical Exam Vitals reviewed.  HENT:     Head: Normocephalic and atraumatic.  Eyes:     Pupils: Pupils are equal, round, and reactive to light.  Cardiovascular:     Rate and Rhythm: Normal rate and regular rhythm.     Heart sounds: Normal heart sounds.  Pulmonary:     Effort: Pulmonary effort is normal.     Breath sounds: Normal  breath sounds.  Abdominal:     General: Bowel sounds are normal.     Palpations: Abdomen is soft.  Musculoskeletal:        General: No tenderness or deformity. Normal range of motion.     Cervical back: Normal range of motion.  Lymphadenopathy:     Cervical: No cervical adenopathy.  Skin:    General: Skin is warm and dry.     Findings: No erythema or rash.  Neurological:     Mental Status: He is alert and oriented to person, place, and time.  Psychiatric:  Behavior: Behavior normal.        Thought Content: Thought content normal.        Judgment: Judgment normal.      Lab Results  Component Value Date   WBC 9.8 02/09/2023   HGB 12.0 (L) 02/09/2023   HCT 38.7 (L) 02/09/2023   MCV 98.5 02/09/2023   PLT 445 (H) 02/09/2023     Chemistry      Component Value Date/Time   NA 136 01/12/2023 0721   NA 139 01/02/2023 1019   NA 141 04/10/2017 0923   NA 140 04/05/2016 0739   K 4.1 01/12/2023 0721   K 3.5 04/10/2017 0923   K 4.2 04/05/2016 0739   CL 97 (L) 01/12/2023 0721   CL 108 04/10/2017 0923   CO2 24 01/12/2023 0721   CO2 24 04/10/2017 0923   CO2 20 (L) 04/05/2016 0739   BUN 33 (H) 01/12/2023 0721   BUN 52 (H) 01/02/2023 1019   BUN 12 04/10/2017 0923   BUN 14.7 04/05/2016 0739   CREATININE 1.98 (H) 01/12/2023 0721   CREATININE 1.26 (H) 11/10/2022 0750   CREATININE 1.1 04/10/2017 0923   CREATININE 1.1 04/05/2016 0739      Component Value Date/Time   CALCIUM 9.2 01/12/2023 0721   CALCIUM 8.4 04/10/2017 0923   CALCIUM 9.0 04/05/2016 0739   ALKPHOS 69 01/12/2023 0721   ALKPHOS 77 04/10/2017 0923   ALKPHOS 91 04/05/2016 0739   AST 17 01/12/2023 0721   AST 13 (L) 11/10/2022 0750   AST 34 04/05/2016 0739   ALT 9 01/12/2023 0721   ALT 8 11/10/2022 0750   ALT 32 04/10/2017 0923   ALT 40 04/05/2016 0739   BILITOT 1.2 01/12/2023 0721   BILITOT 0.7 11/10/2022 0750   BILITOT 0.48 04/05/2016 0739      Impression and Plan: Mr. Lilja is 72 year old  gentleman with polycythemia vera.  Everything is doing okay from my point of view.  We have not had to phlebotomize him  for good 5 years.    I think his issues clearly are cardiology related at this point.  He has a pacemaker in.  He does have heart failure.  I think this will dictate his prognosis.  As always, he does not need to have a phlebotomy.  We will now plan to get him through the Holiday season.  Hopefully, he will not have any further issues or hospitalizations for heart failure.    Josph Macho, MD 10/4/20248:07 AM

## 2023-02-12 ENCOUNTER — Ambulatory Visit: Payer: PPO | Attending: Internal Medicine

## 2023-02-12 ENCOUNTER — Ambulatory Visit: Payer: PPO

## 2023-02-12 ENCOUNTER — Other Ambulatory Visit: Payer: Self-pay

## 2023-02-12 DIAGNOSIS — I472 Ventricular tachycardia, unspecified: Secondary | ICD-10-CM | POA: Diagnosis not present

## 2023-02-12 DIAGNOSIS — Z95810 Presence of automatic (implantable) cardiac defibrillator: Secondary | ICD-10-CM | POA: Diagnosis not present

## 2023-02-12 DIAGNOSIS — I5042 Chronic combined systolic (congestive) and diastolic (congestive) heart failure: Secondary | ICD-10-CM

## 2023-02-12 NOTE — Addendum Note (Signed)
Addended by: [name], Dabria Wadas L on: 02/12/2023 03:22 PM   Modules accepted: Orders

## 2023-02-13 LAB — MAGNESIUM: Magnesium: 2.3 mg/dL (ref 1.6–2.3)

## 2023-02-13 LAB — TSH: TSH: 0.729 u[IU]/mL (ref 0.450–4.500)

## 2023-02-13 LAB — COMPREHENSIVE METABOLIC PANEL
ALT: 12 [IU]/L (ref 0–44)
AST: 13 [IU]/L (ref 0–40)
Albumin: 4 g/dL (ref 3.8–4.8)
Alkaline Phosphatase: 69 [IU]/L (ref 44–121)
BUN/Creatinine Ratio: 29 — ABNORMAL HIGH (ref 10–24)
BUN: 47 mg/dL — ABNORMAL HIGH (ref 8–27)
Bilirubin Total: 0.7 mg/dL (ref 0.0–1.2)
CO2: 18 mmol/L — ABNORMAL LOW (ref 20–29)
Calcium: 9 mg/dL (ref 8.6–10.2)
Chloride: 100 mmol/L (ref 96–106)
Creatinine, Ser: 1.64 mg/dL — ABNORMAL HIGH (ref 0.76–1.27)
Globulin, Total: 2.6 g/dL (ref 1.5–4.5)
Glucose: 71 mg/dL (ref 70–99)
Potassium: 5.1 mmol/L (ref 3.5–5.2)
Sodium: 137 mmol/L (ref 134–144)
Total Protein: 6.6 g/dL (ref 6.0–8.5)
eGFR: 44 mL/min/{1.73_m2} — ABNORMAL LOW (ref >59)

## 2023-02-14 ENCOUNTER — Telehealth (HOSPITAL_COMMUNITY): Payer: Self-pay | Admitting: Cardiology

## 2023-02-14 NOTE — Telephone Encounter (Signed)
ok 

## 2023-02-14 NOTE — Telephone Encounter (Signed)
Patient called and stated that he has two cardiologists already and would not like to be seen at the advanced heart failure clinic at [facility]: [facility] [address] [place] by Dr. Elwyn Lade. Patient is referring to the "no show" letter that he received from missed appointment on 02/06/2023.

## 2023-02-15 ENCOUNTER — Encounter: Payer: Self-pay | Admitting: Physician Assistant

## 2023-02-15 ENCOUNTER — Ambulatory Visit: Payer: PPO | Attending: Physician Assistant | Admitting: Physician Assistant

## 2023-02-15 VITALS — BP 140/60 | HR 78 | Ht 70.0 in | Wt 228.0 lb

## 2023-02-15 DIAGNOSIS — I472 Ventricular tachycardia, unspecified: Secondary | ICD-10-CM

## 2023-02-15 DIAGNOSIS — I5022 Chronic systolic (congestive) heart failure: Secondary | ICD-10-CM | POA: Diagnosis not present

## 2023-02-15 DIAGNOSIS — Z95810 Presence of automatic (implantable) cardiac defibrillator: Secondary | ICD-10-CM | POA: Diagnosis not present

## 2023-02-15 DIAGNOSIS — I428 Other cardiomyopathies: Secondary | ICD-10-CM | POA: Diagnosis not present

## 2023-02-15 LAB — CUP PACEART INCLINIC DEVICE CHECK
Battery Remaining Longevity: 68 mo
Brady Statistic RA Percent Paced: 1.7 %
Brady Statistic RV Percent Paced: 96 %
Date Time Interrogation Session: 20241010164810
HighPow Impedance: 84.375
Implantable Lead Connection Status: 753985
Implantable Lead Connection Status: 753985
Implantable Lead Connection Status: 753985
Implantable Lead Implant Date: 20151223
Implantable Lead Implant Date: 20151223
Implantable Lead Implant Date: 20231222
Implantable Lead Location: 753858
Implantable Lead Location: 753859
Implantable Lead Location: 753860
Implantable Lead Model: 7122
Implantable Pulse Generator Implant Date: 20231222
Lead Channel Impedance Value: 425 Ohm
Lead Channel Impedance Value: 475 Ohm
Lead Channel Impedance Value: 662.5 Ohm
Lead Channel Pacing Threshold Amplitude: 0.5 V
Lead Channel Pacing Threshold Amplitude: 0.5 V
Lead Channel Pacing Threshold Amplitude: 0.75 V
Lead Channel Pacing Threshold Amplitude: 0.75 V
Lead Channel Pacing Threshold Amplitude: 1 V
Lead Channel Pacing Threshold Amplitude: 1 V
Lead Channel Pacing Threshold Pulse Width: 0.5 ms
Lead Channel Pacing Threshold Pulse Width: 0.5 ms
Lead Channel Pacing Threshold Pulse Width: 0.7 ms
Lead Channel Pacing Threshold Pulse Width: 0.7 ms
Lead Channel Pacing Threshold Pulse Width: 1 ms
Lead Channel Pacing Threshold Pulse Width: 1 ms
Lead Channel Sensing Intrinsic Amplitude: 12 mV
Lead Channel Sensing Intrinsic Amplitude: 4.1 mV
Lead Channel Setting Pacing Amplitude: 2 V
Lead Channel Setting Pacing Amplitude: 2 V
Lead Channel Setting Pacing Amplitude: 3 V
Lead Channel Setting Pacing Pulse Width: 0.5 ms
Lead Channel Setting Pacing Pulse Width: 0.7 ms
Lead Channel Setting Sensing Sensitivity: 0.5 mV
Pulse Gen Serial Number: 5555736

## 2023-02-15 NOTE — Progress Notes (Signed)
Cardiology Office Note Date:  02/15/2023  Patient ID:  William Villanueva, William Villanueva 07/25/1950, MRN [id number] PCP:  Tally Joe, MD  Cardiologist:  Dr. Jens Som Electrophysiologist: Dr. Graciela Husbands    Chief Complaint:    VT  History of Present Illness: William Villanueva is a 73 y.o. male with history of NICM, CHB, CRT-D, polycythemia vera, hemochromatosis, orthostatic hypotension.  In review of notes: Cardiac catheterization in September of 2013 showed mild nonobstructive coronary disease. There was no hemodynamic evidence of restriction. Pulmonary capillary wedge pressure 10.  Cardiac MRI in September of 2013 showed an ejection fraction of 34% with diffuse hypokinesis. There was no hyperenhancement or scar tissue and no evidence of cardiac hemochromatosis  2021, EF again reduced >> ECG optimization >> 45-50% in 2022  TTE with LVEF 40%, global hypokinesis TTE Aug 2024 <20%  Saw Dr. Graciela Husbands April 2024, orthostatic symptoms and losartan stopped Saw A. Tillery June 2024, volume stable, no changes made   ED on 12/21/2022 for increasing shortness of breath and chest tightness worsened with exertion. He also reported 2 week history of dry cough. He reported a 20 lb weight gain over a week with lower extremity edema, orthopnea and PND.  He was diuresed and transition to torsemide 20 mg twice daily on discharge 12/24/2022.    Saw Cards team 12/26/22, diuretics adjusted  Saw cards in f/u 01/03/23 cont to struggle with volume, suspected at/nearing endstage HF planned for cath    Patient is intolerant  Entresto secondary to hypotension ACE/ARB secondary to orthostasis Cannot take SGLT2 secondary to risk of GU infections.    01/09/23: R/LHC as planned and admitted post cath for diuresis.  Diuresed 4L, Jardiance added to his meds, coreg increased.  BP % only 86%, QRS noted to be wider, and reached out to EP to arrange attempts to EKG optimize  Discharged 01/12/23  I saw him 01/15/23 He is with his wife  today Doing "fair" Felt pretty sluggish when first home, but a little better now. Gets a bit dizzy with standing, no near syncope or syncope No CP Breathing "OK" Device optimized from a QRS duration 136 > PVCs observed, shortening AV delay reduced burden, did not eliminate optiVol high but trending down Planned to see gen cards team soon.  Saw cardiology APP 01/19/23 feeling better, vilume status good, no PRN diuretic needs, remained weak, [poor exertional capacity. Low BP Reported frustrated, that nothing was being done to help him. EKG without PVCs Coreg reduced for hypotension   Device clinic alert for VT treated with ATP on 01/29/23, advised labs and his coreg changed to Toprol.  10/4: K+ 4.3 BUN/Creat 52/1.45  10/7: K+ 5.1 Mag 2.3 BUN/Creat 47/1.64  TODAY He was symptomatic with the event Felt palpitations, get very lightheaded, started to faint but did not and then ist stopped. None since He is very diligent watching his weight, BP, blood sugar at home. He gained one pound from yesterday otherwise has been pretty stable, not having to use a prn diuretic dose in a week.  No rest SOB, + DOE  Device information SJM CRT-P implanted 04/29/2014, >> CRT-D on 04/28/22  + hx of appropriate tx for VT (ATP Sept 2024)   Past Medical History:  Diagnosis Date   AV block, 2nd degree 2015   St. Jude Allure Quadra pulse generator [id number], model PM 3242   Back pain    Cardiomyopathy (HCC)    Nonischemic 45%.    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 Jude Allure Quadra pulse generator [id number], model [place] 4540   CARDIAC SURGERY     COLONOSCOPY N/A 02/15/2020   Procedure: COLONOSCOPY;  Surgeon: Kerin Salen, MD;  Location[facility] ENDOSCOPY;  Service: Gastroenterology;  Laterality: N/A;   ESOPHAGOGASTRODUODENOSCOPY (EGD) WITH PROPOFOL N/A 02/15/2020   Procedure: ESOPHAGOGASTRODUODENOSCOPY (EGD) WITH PROPOFOL;  Surgeon: Kerin Salen, MD;  Location: [facility] ENDOSCOPY;  Service: Gastroenterology;  Laterality: N/A;   LEAD INSERTION N/A 04/28/2022   Procedure: LEAD INSERTION;  Surgeon: Duke Salvia, MD;  Location: [facility] INVASIVE CV LAB;  Service: Cardiovascular;  Laterality: N/A;   PILONIDAL CYST EXCISION     POSTERIOR LAMINECTOMY / DECOMPRESSION LUMBAR SPINE     PPM GENERATOR CHANGEOUT N/A 04/28/2022   Procedure: PPM GENERATOR CHANGEOUT;  Surgeon: Duke Salvia, MD;  Location: [facility] INVASIVE CV LAB;  Service: Cardiovascular;  Laterality: N/A;   RIGHT/LEFT HEART CATH AND CORONARY ANGIOGRAPHY N/A 01/10/2023   Procedure: RIGHT/LEFT HEART CATH AND CORONARY ANGIOGRAPHY;  Surgeon: Marykay Lex, MD;  Location: [facility] INVASIVE CV LAB;  Service: Cardiovascular;  Laterality: N/A;   TONSILLECTOMY      Current Outpatient Medications  Medication Sig Dispense Refill   acetaminophen (TYLENOL) 500 MG tablet Take 1,000 mg by mouth every 6 (six) hours as needed for headache.     allopurinol (ZYLOPRIM) 300 MG tablet Take 1 tablet (300 mg total) by mouth daily. 90 tablet 1   Ascorbic Acid (VITAMIN C PO) Take by mouth.     budesonide (PULMICORT) 0.5 MG/2ML nebulizer solution Take 2 mLs (0.5 mg total) by nebulization 2 (two) times daily. (RINSE MOUTH AFTER USE) 360 mL 11   cholestyramine (QUESTRAN) 4 GM/DOSE powder Take 4 g by mouth daily as needed (diarrhea).     citalopram (CELEXA) 10 MG tablet Take 5 mg by mouth in the morning.     Cyanocobalamin  (B-12 PO) Take 1 tablet by mouth daily.     diclofenac Sodium (VOLTAREN) 1 % GEL Apply 2 g topically daily as needed (pain).     dicyclomine (BENTYL) 20 MG tablet Take 20 mg by mouth 2 (two) times daily.     empagliflozin (JARDIANCE) 10 MG TABS tablet Take 1 tablet (10 mg total) by mouth daily. 90 tablet 3   hydroxyurea (HYDREA) 500 MG capsule TAKE TWO CAPSULES BY MOUTH DAILY 160 capsule 4   insulin degludec (TRESIBA FLEXTOUCH) 100 UNIT/ML SOPN FlexTouch Pen Inject 16 Units into the skin daily after breakfast.     ipratropium-albuterol (DUONEB) 0.5-2.5 (3) MG/3ML SOLN Inhale one vial in nebulizer twice daily and in between if needed     magnesium gluconate (MAGONATE) 500 MG tablet Take 500 mg by mouth in the morning and at bedtime.     metoprolol succinate (TOPROL XL) 25 MG 24 hr tablet Take 1 tablet (25 mg total) by mouth daily. 30 tablet 6   naphazoline-glycerin (CLEAR EYES REDNESS) 0.012-0.25 % SOLN Place 1-2 drops into both eyes  Cardiology Office Note Date:  02/15/2023  Patient ID:  William Villanueva, William Villanueva 07/25/1950, MRN [id number] PCP:  Tally Joe, MD  Cardiologist:  Dr. Jens Som Electrophysiologist: Dr. Graciela Husbands    Chief Complaint:    VT  History of Present Illness: William Villanueva is a 73 y.o. male with history of NICM, CHB, CRT-D, polycythemia vera, hemochromatosis, orthostatic hypotension.  In review of notes: Cardiac catheterization in September of 2013 showed mild nonobstructive coronary disease. There was no hemodynamic evidence of restriction. Pulmonary capillary wedge pressure 10.  Cardiac MRI in September of 2013 showed an ejection fraction of 34% with diffuse hypokinesis. There was no hyperenhancement or scar tissue and no evidence of cardiac hemochromatosis  2021, EF again reduced >> ECG optimization >> 45-50% in 2022  TTE with LVEF 40%, global hypokinesis TTE Aug 2024 <20%  Saw Dr. Graciela Husbands April 2024, orthostatic symptoms and losartan stopped Saw A. Tillery June 2024, volume stable, no changes made   ED on 12/21/2022 for increasing shortness of breath and chest tightness worsened with exertion. He also reported 2 week history of dry cough. He reported a 20 lb weight gain over a week with lower extremity edema, orthopnea and PND.  He was diuresed and transition to torsemide 20 mg twice daily on discharge 12/24/2022.    Saw Cards team 12/26/22, diuretics adjusted  Saw cards in f/u 01/03/23 cont to struggle with volume, suspected at/nearing endstage HF planned for cath    Patient is intolerant  Entresto secondary to hypotension ACE/ARB secondary to orthostasis Cannot take SGLT2 secondary to risk of GU infections.    01/09/23: R/LHC as planned and admitted post cath for diuresis.  Diuresed 4L, Jardiance added to his meds, coreg increased.  BP % only 86%, QRS noted to be wider, and reached out to EP to arrange attempts to EKG optimize  Discharged 01/12/23  I saw him 01/15/23 He is with his wife  today Doing "fair" Felt pretty sluggish when first home, but a little better now. Gets a bit dizzy with standing, no near syncope or syncope No CP Breathing "OK" Device optimized from a QRS duration 136 > PVCs observed, shortening AV delay reduced burden, did not eliminate optiVol high but trending down Planned to see gen cards team soon.  Saw cardiology APP 01/19/23 feeling better, vilume status good, no PRN diuretic needs, remained weak, [poor exertional capacity. Low BP Reported frustrated, that nothing was being done to help him. EKG without PVCs Coreg reduced for hypotension   Device clinic alert for VT treated with ATP on 01/29/23, advised labs and his coreg changed to Toprol.  10/4: K+ 4.3 BUN/Creat 52/1.45  10/7: K+ 5.1 Mag 2.3 BUN/Creat 47/1.64  TODAY He was symptomatic with the event Felt palpitations, get very lightheaded, started to faint but did not and then ist stopped. None since He is very diligent watching his weight, BP, blood sugar at home. He gained one pound from yesterday otherwise has been pretty stable, not having to use a prn diuretic dose in a week.  No rest SOB, + DOE  Device information SJM CRT-P implanted 04/29/2014, >> CRT-D on 04/28/22  + hx of appropriate tx for VT (ATP Sept 2024)   Past Medical History:  Diagnosis Date   AV block, 2nd degree 2015   St. Jude Allure Quadra pulse generator [id number], model PM 3242   Back pain    Cardiomyopathy (HCC)    Nonischemic 45%.    CHF (congestive heart failure) (HCC)  Cardiology Office Note Date:  02/15/2023  Patient ID:  William Villanueva, William Villanueva 07/25/1950, MRN [id number] PCP:  Tally Joe, MD  Cardiologist:  Dr. Jens Som Electrophysiologist: Dr. Graciela Husbands    Chief Complaint:    VT  History of Present Illness: William Villanueva is a 73 y.o. male with history of NICM, CHB, CRT-D, polycythemia vera, hemochromatosis, orthostatic hypotension.  In review of notes: Cardiac catheterization in September of 2013 showed mild nonobstructive coronary disease. There was no hemodynamic evidence of restriction. Pulmonary capillary wedge pressure 10.  Cardiac MRI in September of 2013 showed an ejection fraction of 34% with diffuse hypokinesis. There was no hyperenhancement or scar tissue and no evidence of cardiac hemochromatosis  2021, EF again reduced >> ECG optimization >> 45-50% in 2022  TTE with LVEF 40%, global hypokinesis TTE Aug 2024 <20%  Saw Dr. Graciela Husbands April 2024, orthostatic symptoms and losartan stopped Saw A. Tillery June 2024, volume stable, no changes made   ED on 12/21/2022 for increasing shortness of breath and chest tightness worsened with exertion. He also reported 2 week history of dry cough. He reported a 20 lb weight gain over a week with lower extremity edema, orthopnea and PND.  He was diuresed and transition to torsemide 20 mg twice daily on discharge 12/24/2022.    Saw Cards team 12/26/22, diuretics adjusted  Saw cards in f/u 01/03/23 cont to struggle with volume, suspected at/nearing endstage HF planned for cath    Patient is intolerant  Entresto secondary to hypotension ACE/ARB secondary to orthostasis Cannot take SGLT2 secondary to risk of GU infections.    01/09/23: R/LHC as planned and admitted post cath for diuresis.  Diuresed 4L, Jardiance added to his meds, coreg increased.  BP % only 86%, QRS noted to be wider, and reached out to EP to arrange attempts to EKG optimize  Discharged 01/12/23  I saw him 01/15/23 He is with his wife  today Doing "fair" Felt pretty sluggish when first home, but a little better now. Gets a bit dizzy with standing, no near syncope or syncope No CP Breathing "OK" Device optimized from a QRS duration 136 > PVCs observed, shortening AV delay reduced burden, did not eliminate optiVol high but trending down Planned to see gen cards team soon.  Saw cardiology APP 01/19/23 feeling better, vilume status good, no PRN diuretic needs, remained weak, [poor exertional capacity. Low BP Reported frustrated, that nothing was being done to help him. EKG without PVCs Coreg reduced for hypotension   Device clinic alert for VT treated with ATP on 01/29/23, advised labs and his coreg changed to Toprol.  10/4: K+ 4.3 BUN/Creat 52/1.45  10/7: K+ 5.1 Mag 2.3 BUN/Creat 47/1.64  TODAY He was symptomatic with the event Felt palpitations, get very lightheaded, started to faint but did not and then ist stopped. None since He is very diligent watching his weight, BP, blood sugar at home. He gained one pound from yesterday otherwise has been pretty stable, not having to use a prn diuretic dose in a week.  No rest SOB, + DOE  Device information SJM CRT-P implanted 04/29/2014, >> CRT-D on 04/28/22  + hx of appropriate tx for VT (ATP Sept 2024)   Past Medical History:  Diagnosis Date   AV block, 2nd degree 2015   St. Jude Allure Quadra pulse generator [id number], model PM 3242   Back pain    Cardiomyopathy (HCC)    Nonischemic 45%.    CHF (congestive heart failure) (HCC)  Cardiology Office Note Date:  02/15/2023  Patient ID:  William Villanueva, William Villanueva 07/25/1950, MRN [id number] PCP:  Tally Joe, MD  Cardiologist:  Dr. Jens Som Electrophysiologist: Dr. Graciela Husbands    Chief Complaint:    VT  History of Present Illness: William Villanueva is a 73 y.o. male with history of NICM, CHB, CRT-D, polycythemia vera, hemochromatosis, orthostatic hypotension.  In review of notes: Cardiac catheterization in September of 2013 showed mild nonobstructive coronary disease. There was no hemodynamic evidence of restriction. Pulmonary capillary wedge pressure 10.  Cardiac MRI in September of 2013 showed an ejection fraction of 34% with diffuse hypokinesis. There was no hyperenhancement or scar tissue and no evidence of cardiac hemochromatosis  2021, EF again reduced >> ECG optimization >> 45-50% in 2022  TTE with LVEF 40%, global hypokinesis TTE Aug 2024 <20%  Saw Dr. Graciela Husbands April 2024, orthostatic symptoms and losartan stopped Saw A. Tillery June 2024, volume stable, no changes made   ED on 12/21/2022 for increasing shortness of breath and chest tightness worsened with exertion. He also reported 2 week history of dry cough. He reported a 20 lb weight gain over a week with lower extremity edema, orthopnea and PND.  He was diuresed and transition to torsemide 20 mg twice daily on discharge 12/24/2022.    Saw Cards team 12/26/22, diuretics adjusted  Saw cards in f/u 01/03/23 cont to struggle with volume, suspected at/nearing endstage HF planned for cath    Patient is intolerant  Entresto secondary to hypotension ACE/ARB secondary to orthostasis Cannot take SGLT2 secondary to risk of GU infections.    01/09/23: R/LHC as planned and admitted post cath for diuresis.  Diuresed 4L, Jardiance added to his meds, coreg increased.  BP % only 86%, QRS noted to be wider, and reached out to EP to arrange attempts to EKG optimize  Discharged 01/12/23  I saw him 01/15/23 He is with his wife  today Doing "fair" Felt pretty sluggish when first home, but a little better now. Gets a bit dizzy with standing, no near syncope or syncope No CP Breathing "OK" Device optimized from a QRS duration 136 > PVCs observed, shortening AV delay reduced burden, did not eliminate optiVol high but trending down Planned to see gen cards team soon.  Saw cardiology APP 01/19/23 feeling better, vilume status good, no PRN diuretic needs, remained weak, [poor exertional capacity. Low BP Reported frustrated, that nothing was being done to help him. EKG without PVCs Coreg reduced for hypotension   Device clinic alert for VT treated with ATP on 01/29/23, advised labs and his coreg changed to Toprol.  10/4: K+ 4.3 BUN/Creat 52/1.45  10/7: K+ 5.1 Mag 2.3 BUN/Creat 47/1.64  TODAY He was symptomatic with the event Felt palpitations, get very lightheaded, started to faint but did not and then ist stopped. None since He is very diligent watching his weight, BP, blood sugar at home. He gained one pound from yesterday otherwise has been pretty stable, not having to use a prn diuretic dose in a week.  No rest SOB, + DOE  Device information SJM CRT-P implanted 04/29/2014, >> CRT-D on 04/28/22  + hx of appropriate tx for VT (ATP Sept 2024)   Past Medical History:  Diagnosis Date   AV block, 2nd degree 2015   St. Jude Allure Quadra pulse generator [id number], model PM 3242   Back pain    Cardiomyopathy (HCC)    Nonischemic 45%.    CHF (congestive heart failure) (HCC)

## 2023-02-15 NOTE — Patient Instructions (Addendum)
Medication Instructions:   Your physician recommends that you continue on your current medications as directed. Please refer to the Current Medication list given to you today.   *If you need a refill on your cardiac medications before your next appointment, please call your pharmacy*   Lab Work:  BMET LAB [facility] ( ORDERS SLIPS  GIVEN )     If you have labs (blood work) drawn today and your tests are completely normal, you will receive your results only by: MyChart Message (if you have MyChart) OR A paper copy in the mail If you have any lab test that is abnormal or we need to change your treatment, we will call you to review the results.   Testing/Procedures: NONE ORDERED  TODAY    Follow-Up: At [facility], you and your health needs are our priority.  As part of our continuing mission to provide you with exceptional heart care, we have created designated Provider Care Teams.  These Care Teams include your primary Cardiologist (physician) and Advanced Practice Providers (APPs -  Physician Assistants and Nurse Practitioners) who all work together to provide you with the care you need, when you need it.  We recommend signing up for the patient portal called "MyChart".  Sign up information is provided on this After Visit Summary.  MyChart is used to connect with patients for Virtual Visits (Telemedicine).  Patients are able to view lab/test results, encounter notes, upcoming appointments, etc.  Non-urgent messages can be sent to your provider as well.   To learn more about what you can do with MyChart, go to [email or web address].    Your next appointment:    2 month(s)  Provider:    Francis Dowse     Other Instructions

## 2023-02-15 NOTE — Progress Notes (Signed)
Remote ICD transmission.   

## 2023-02-16 ENCOUNTER — Telehealth: Payer: Self-pay | Admitting: *Deleted

## 2023-02-16 NOTE — Telephone Encounter (Signed)
Spoke with patient and wife aware and verbalized understanding to continue on Metoprolol 25 mg once a day.

## 2023-02-16 NOTE — Progress Notes (Signed)
EPIC Encounter for ICM Monitoring  Patient Name: William Villanueva is a 72 y.o. male Date: 02/16/2023 Primary Care Physican: Tally Joe, MD Primary Cardiologist: Jens Som Electrophysiologist: Joycelyn Schmid Pacing: 90%            07/17/2022 Weight:  217 lbs 09/20/2022 Weight: 227 lbs 10/24/2022 Weight: 228 lbs 01/04/2023 Weight: 234 lbs    AT/AF Burden <1%                                                      Spoke with patient and heart failure questions reviewed.  Transmission results reviewed.  Pt if feeling okay at this time.  Report reviewed at 10/10 OV with Francis Dowse, PA.                  CorVue thoracic daily impedance normal fluid levels.   Prescribed:   Torsemide 40 mg Take 0.5 tablet(s) (20 mg total) by mouth twice a day.   Spironolactone 25 mg take 0.5 tablet (12.5 mg total) once a day   Labs: 01/02/2023 Creatinine 1.73, BUN 52, Potassium 5.1, Sodium 139, GFR 42  12/26/2022 Creatinine 2.08, BUN 47, Potassium 5.4, Sodium 137, GFR 33  12/24/2022 Creatinine 1.86, BUN 36, Potassium 4.3, Sodium 135  12/23/2022 Creatinine 1.53, BUN 30, Potassium 4.5, Sodium 136 12/22/2022 Creatinine 1.46, BUN 25, Potassium 4.1, Sodium 136  A complete set of results can be found in Results Review.   Recommendations:  Encouraged to call if experiencing any fluid symptoms.    Follow-up plan: ICM clinic phone appointment on 03/19/2023.   91 day device clinic remote transmission 05/01/2023.     EP/Cardiology Office Visits:   Recall 02/14/2023 with Dr Jens Som.  04/17/2023 with Dr Graciela Husbands.   Copy of ICM check sent to Dr. Graciela Husbands  3 month ICM trend: 02/12/2023.    12-14 Month ICM trend:     Karie Soda, RN 02/16/2023 1:09 PM

## 2023-02-22 ENCOUNTER — Telehealth: Payer: Self-pay

## 2023-02-22 ENCOUNTER — Other Ambulatory Visit: Payer: Self-pay

## 2023-02-22 DIAGNOSIS — Z79899 Other long term (current) drug therapy: Secondary | ICD-10-CM

## 2023-02-22 NOTE — Telephone Encounter (Signed)
-----   Message from Sheilah Pigeon sent at 02/21/2023  2:52 PM EDT ----- Can you follow up on his lab draw, was to have been done 3 weeks from my visit ----- Message ----- From: SYSTEM Sent: 02/20/2023  12:20 AM EDT To: Sheilah Pigeon, PA-C

## 2023-02-22 NOTE — Telephone Encounter (Signed)
Called pt to remind him to come for a BMP during the week of 10/28 for his 3 week post- office visit lab draw. New BMP order placed and pt aware that he needs to have the blood draw by 10/31.

## 2023-02-26 ENCOUNTER — Telehealth: Payer: Self-pay | Admitting: Internal Medicine

## 2023-02-26 DIAGNOSIS — Z79899 Other long term (current) drug therapy: Secondary | ICD-10-CM | POA: Diagnosis not present

## 2023-02-26 LAB — BASIC METABOLIC PANEL
BUN/Creatinine Ratio: 21 (ref 10–24)
BUN: 32 mg/dL — ABNORMAL HIGH (ref 8–27)
CO2: 26 mmol/L (ref 20–29)
Calcium: 9.3 mg/dL (ref 8.6–10.2)
Chloride: 102 mmol/L (ref 96–106)
Creatinine, Ser: 1.49 mg/dL — ABNORMAL HIGH (ref 0.76–1.27)
Glucose: 97 mg/dL (ref 70–99)
Potassium: 5.5 mmol/L — ABNORMAL HIGH (ref 3.5–5.2)
Sodium: 140 mmol/L (ref 134–144)
eGFR: 50 mL/min/{1.73_m2} — ABNORMAL LOW (ref >59)

## 2023-02-26 NOTE — Telephone Encounter (Signed)
Pt states he had blood work done today and wants to know if he needs to get it done again next week. Please advise

## 2023-02-26 NOTE — Telephone Encounter (Signed)
Spoke with Luster Landsberg about patient's elevated potassium, he will stop spironolactone and repeat labs on Wednesday. Patient has been notified and verbalized understanding.

## 2023-03-02 DIAGNOSIS — I472 Ventricular tachycardia, unspecified: Secondary | ICD-10-CM | POA: Diagnosis not present

## 2023-03-03 ENCOUNTER — Telehealth: Payer: Self-pay | Admitting: Internal Medicine

## 2023-03-03 LAB — BASIC METABOLIC PANEL
BUN/Creatinine Ratio: 20 (ref 10–24)
BUN: 29 mg/dL — ABNORMAL HIGH (ref 8–27)
CO2: 23 mmol/L (ref 20–29)
Calcium: 9.1 mg/dL (ref 8.6–10.2)
Chloride: 101 mmol/L (ref 96–106)
Creatinine, Ser: 1.48 mg/dL — ABNORMAL HIGH (ref 0.76–1.27)
Glucose: 74 mg/dL (ref 70–99)
Potassium: 5.9 mmol/L (ref 3.5–5.2)
Sodium: 140 mmol/L (ref 134–144)
eGFR: 50 mL/min/{1.73_m2} — ABNORMAL LOW (ref >59)

## 2023-03-03 NOTE — Telephone Encounter (Signed)
Received call from [facility] regarding patient's most recent BMP check.  Patient's potassium was 5.9 mmol/L when checked earlier today, up from 5.5 mmol/L prior.  Patient was told to hold his spironolactone prior.   Attempted call to [name] and unable to reach by phone.  I left a discrete voicemail informing patient that he should be seen at emergent care to have his labs rechecked and potentially initiated on potassium lowering interventions.

## 2023-03-04 ENCOUNTER — Telehealth: Payer: Self-pay | Admitting: Physician Assistant

## 2023-03-04 NOTE — Telephone Encounter (Signed)
Called regarding lab result, hyperkalemia Pt's phone number went straight to VM, without an option to leave a message Left a message at the alternate number/also listed as his spouse's number advising he go to the ER to recheck his potassium level ASAP tonight, which may need intervening on.  Francis Dowse, PA-C

## 2023-03-05 ENCOUNTER — Other Ambulatory Visit: Payer: Self-pay

## 2023-03-05 ENCOUNTER — Encounter (HOSPITAL_BASED_OUTPATIENT_CLINIC_OR_DEPARTMENT_OTHER): Payer: Self-pay

## 2023-03-05 ENCOUNTER — Emergency Department (HOSPITAL_BASED_OUTPATIENT_CLINIC_OR_DEPARTMENT_OTHER)
Admission: EM | Admit: 2023-03-05 | Discharge: 2023-03-05 | Disposition: A | Discharge status: Home / Self Care | Payer: PPO | Attending: Emergency Medicine | Admitting: Emergency Medicine

## 2023-03-05 DIAGNOSIS — I110 Hypertensive heart disease with heart failure: Secondary | ICD-10-CM | POA: Diagnosis not present

## 2023-03-05 DIAGNOSIS — I10 Essential (primary) hypertension: Secondary | ICD-10-CM | POA: Diagnosis not present

## 2023-03-05 DIAGNOSIS — E119 Type 2 diabetes mellitus without complications: Secondary | ICD-10-CM | POA: Diagnosis not present

## 2023-03-05 DIAGNOSIS — I509 Heart failure, unspecified: Secondary | ICD-10-CM | POA: Diagnosis not present

## 2023-03-05 DIAGNOSIS — Z794 Long term (current) use of insulin: Secondary | ICD-10-CM | POA: Insufficient documentation

## 2023-03-05 DIAGNOSIS — R899 Unspecified abnormal finding in specimens from other organs, systems and tissues: Secondary | ICD-10-CM | POA: Insufficient documentation

## 2023-03-05 DIAGNOSIS — R799 Abnormal finding of blood chemistry, unspecified: Secondary | ICD-10-CM | POA: Diagnosis not present

## 2023-03-05 LAB — CBC
HCT: 40.2 % (ref 39.0–52.0)
Hemoglobin: 12.2 g/dL — ABNORMAL LOW (ref 13.0–17.0)
MCH: 30.4 pg (ref 26.0–34.0)
MCHC: 30.3 g/dL (ref 30.0–36.0)
MCV: 100.2 fL — ABNORMAL HIGH (ref 80.0–100.0)
Platelets: 806 10*3/uL — ABNORMAL HIGH (ref 150–400)
RBC: 4.01 MIL/uL — ABNORMAL LOW (ref 4.22–5.81)
RDW: 16.7 % — ABNORMAL HIGH (ref 11.5–15.5)
WBC: 9.5 10*3/uL (ref 4.0–10.5)
nRBC: 0.3 % — ABNORMAL HIGH (ref 0.0–0.2)

## 2023-03-05 LAB — COMPREHENSIVE METABOLIC PANEL
ALT: 13 U/L (ref 0–44)
AST: 16 U/L (ref 15–41)
Albumin: 3.8 g/dL (ref 3.5–5.0)
Alkaline Phosphatase: 57 U/L (ref 38–126)
Anion gap: 11 (ref 5–15)
BUN: 37 mg/dL — ABNORMAL HIGH (ref 8–23)
CO2: 22 mmol/L (ref 22–32)
Calcium: 8.6 mg/dL — ABNORMAL LOW (ref 8.9–10.3)
Chloride: 105 mmol/L (ref 98–111)
Creatinine, Ser: 1.64 mg/dL — ABNORMAL HIGH (ref 0.61–1.24)
GFR, Estimated: 44 mL/min — ABNORMAL LOW (ref >60)
Glucose, Bld: 117 mg/dL — ABNORMAL HIGH (ref 70–99)
Potassium: 4.5 mmol/L (ref 3.5–5.1)
Sodium: 138 mmol/L (ref 135–145)
Total Bilirubin: 0.7 mg/dL (ref 0.3–1.2)
Total Protein: 7 g/dL (ref 6.5–8.1)

## 2023-03-05 NOTE — Telephone Encounter (Signed)
Spoke with the patient and advised him that he need to go to the emergency room to have his potassium rechecked. Patient verbalized understanding. He states that his wife keeps up with his medication so he is not sure if he has still been taking spironolactone. He requested that I call his wife and inform her.  Spoke with the patient's wife and advised on the need for the patient to go to the ER for recheck of his potassium. She confirms that he did stop his spironolactone.

## 2023-03-05 NOTE — Discharge Instructions (Addendum)
Thank you for coming to [facility] Emergency Department. You were seen for concern for high potassium. We did an exam, labs, which showed a normal potassium. Please continue to not take the spironolactone. Please follow up with your primary care provider within 1 week.   Do not hesitate to return to the ED or call 911 if you experience: -Worsening symptoms -Chest pain, shortness of breath -Lightheadedness, passing out -Fevers/chills -Anything else that concerns you

## 2023-03-05 NOTE — ED Triage Notes (Signed)
Pt reports his doctor called him today and told him to come to the ER because his potassium was high at 5.9 which was drawn on 10/25. He reports he feels fine, denies complaints other than an intermittent headache. Denies chest pain.

## 2023-03-05 NOTE — ED Provider Notes (Signed)
[facility] Provider Note   CSN: [id number] Arrival date & time: 03/05/23  1654     History  Chief Complaint  Patient presents with   Abnormal Lab    William Villanueva is a 72 y.o. male with PMH as listed below who presents with concern for hyperkalemia. Had K drawn on 10/25 that was 5.9 and was called and told to come to the ED immediately.  He was also discontinued from his spironolactone.  Patient has had intermittent headaches over the last several weeks but otherwise feels fine and in his normal state of health.  He denies any palpitations or chest pain, shortness of breath, flulike symptoms.    Past Medical History:  Diagnosis Date   AV block, 2nd degree 2015   [employer] Allure Quadra pulse generator [id number], model PM 3242   Back pain    Cardiomyopathy (HCC)    Nonischemic 45%.    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 Authorizing Provider  acetaminophen (TYLENOL) 500 MG tablet Take 1,000 mg by mouth every 6 (six) hours as needed for headache.    [provider]  allopurinol (ZYLOPRIM) 300 MG tablet Take 1 tablet (300 mg total) by mouth daily. 01/25/23   Rice, Jamesetta Orleans, MD  Ascorbic Acid (VITAMIN C PO) Take by mouth.    [provider]  budesonide (PULMICORT) 0.5 MG/2ML nebulizer solution Take 2 mLs (0.5 mg total) by nebulization 2 (two) times daily. (RINSE MOUTH AFTER USE) 09/28/22   Nyoka Cowden, MD  cholestyramine Lanetta Inch) 4 GM/DOSE powder Take 4 g by mouth daily as needed  (diarrhea). 03/22/22   [provider]  citalopram (CELEXA) 10 MG tablet Take 5 mg by mouth in the morning. Patient not taking: Reported on 02/15/2023 03/01/22   [provider]  Cyanocobalamin (B-12 PO) Take 1 tablet by mouth daily.    [provider]  diclofenac Sodium (VOLTAREN) 1 % GEL Apply 2 g topically daily as needed (pain).    [provider]  dicyclomine (BENTYL) 20 MG tablet Take 20 mg by mouth 2 (two) times daily. 03/28/22   [provider]  empagliflozin (JARDIANCE) 10 MG TABS tablet Take 1 tablet (10 mg total) by mouth daily. 01/13/23   Azalee Course, PA  hydroxyurea (HYDREA) 500 MG capsule TAKE TWO CAPSULES BY MOUTH DAILY 07/21/22   Josph Macho, MD  insulin degludec (TRESIBA FLEXTOUCH) 100 UNIT/ML SOPN FlexTouch Pen Inject 16 Units into the skin daily after breakfast.    [provider]  ipratropium-albuterol (DUONEB) 0.5-2.5 (3) MG/3ML SOLN Inhale one vial in nebulizer twice daily and in between if needed 05/18/22   Nyoka Cowden, MD  magnesium gluconate (MAGONATE) 500 MG tablet Take 500 mg by mouth in the morning and at bedtime.    [provider]  metoprolol succinate (TOPROL XL) 25 MG 24 hr tablet Take 1 tablet (25 mg total) by mouth daily. 02/01/23   Graciella Freer, PA-C  naphazoline-glycerin (CLEAR EYES REDNESS) 0.012-0.25 % SOLN  Radiology No results found.  Procedures Procedures    Medications Ordered in ED Medications - No data to display  ED Course/ Medical Decision Making/ A&P                          Medical Decision Making Amount and/or Complexity of Data Reviewed Labs: ordered. Decision-making details documented in ED Course.    MDM:    Patient's potassium is rechecked here and it is 4.5.  He was discontinued from his spironolactone at that time.  Consider possible hemolysis, erroneous lab value, or decrease in the potassium with discontinuation of his spironolactone.  I discussed with the patient who feels well and would like to be discharged.  I encouraged him to not take the spironolactone as he was instructed and to follow-up with his primary doctor as originally scheduled.  Encouraged him to eat well and stay well-hydrated and to take his medications as prescribed.  Given discharge instructions and return precautions, all questions answered to patient satisfaction.  Clinical Course as of 03/05/23 1839  Mon Mar 05, 2023  1738 Potassium: 4.5 wnl [HN]  1738 Creatinine(!): 1.64 Cr at baseline w/ known CKD [HN]    Clinical Course User Index [HN] Loetta Rough, MD    Labs: I Ordered, and personally interpreted labs.  The pertinent  results include: Those listed above  Additional history obtained from chart review, wife at bedside.    Cardiac Monitoring: The patient was maintained on a cardiac monitor.  I personally viewed and interpreted the cardiac monitored which showed an underlying rhythm of: Paced rhythm  Social Determinants of Health:  lives independently  Disposition: DC  Co morbidities that complicate the patient evaluation  Past Medical History:  Diagnosis Date   AV block, 2nd degree 2015   [employer] Jude Allure Quadra pulse generator [id number], model PM 3242   Back pain    Cardiomyopathy (HCC)    Nonischemic 45%.    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   I have reviewed the patients home medicines and have made adjustments as needed  Problem List / ED Course: Problem List Items Addressed This Visit   None Visit Diagnoses     Abnormal laboratory test    -  Primary                   This note was created using dictation software, which may contain spelling or grammatical errors.    Loetta Rough, MD 03/05/23 [phone]  Radiology No results found.  Procedures Procedures    Medications Ordered in ED Medications - No data to display  ED Course/ Medical Decision Making/ A&P                          Medical Decision Making Amount and/or Complexity of Data Reviewed Labs: ordered. Decision-making details documented in ED Course.    MDM:    Patient's potassium is rechecked here and it is 4.5.  He was discontinued from his spironolactone at that time.  Consider possible hemolysis, erroneous lab value, or decrease in the potassium with discontinuation of his spironolactone.  I discussed with the patient who feels well and would like to be discharged.  I encouraged him to not take the spironolactone as he was instructed and to follow-up with his primary doctor as originally scheduled.  Encouraged him to eat well and stay well-hydrated and to take his medications as prescribed.  Given discharge instructions and return precautions, all questions answered to patient satisfaction.  Clinical Course as of 03/05/23 1839  Mon Mar 05, 2023  1738 Potassium: 4.5 wnl [HN]  1738 Creatinine(!): 1.64 Cr at baseline w/ known CKD [HN]    Clinical Course User Index [HN] Loetta Rough, MD    Labs: I Ordered, and personally interpreted labs.  The pertinent  results include: Those listed above  Additional history obtained from chart review, wife at bedside.    Cardiac Monitoring: The patient was maintained on a cardiac monitor.  I personally viewed and interpreted the cardiac monitored which showed an underlying rhythm of: Paced rhythm  Social Determinants of Health:  lives independently  Disposition: DC  Co morbidities that complicate the patient evaluation  Past Medical History:  Diagnosis Date   AV block, 2nd degree 2015   [employer] Allure Quadra pulse generator [id number], model PM 3242   Back pain    Cardiomyopathy (HCC)    Nonischemic 45%.    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   I have reviewed the patients home medicines and have made adjustments as needed  Problem List / ED Course: Problem List Items Addressed This Visit   None Visit Diagnoses     Abnormal laboratory test    -  Primary                   This note was created using dictation software, which may contain spelling or grammatical errors.    Loetta Rough, MD 03/05/23 [phone]

## 2023-03-19 ENCOUNTER — Ambulatory Visit: Payer: PPO | Attending: Internal Medicine

## 2023-03-19 DIAGNOSIS — I5022 Chronic systolic (congestive) heart failure: Secondary | ICD-10-CM | POA: Diagnosis not present

## 2023-03-19 DIAGNOSIS — Z95810 Presence of automatic (implantable) cardiac defibrillator: Secondary | ICD-10-CM | POA: Diagnosis not present

## 2023-03-21 ENCOUNTER — Telehealth: Payer: Self-pay

## 2023-03-21 NOTE — Progress Notes (Signed)
EPIC Encounter for ICM Monitoring  Patient Name: William Villanueva is a 72 y.o. male Date: 03/21/2023 Primary Care Physican: Tally Joe, MD Primary Cardiologist: Jens Som Electrophysiologist: Joycelyn Schmid Pacing: 98%            07/17/2022 Weight:  217 lbs 09/20/2022 Weight: 227 lbs 10/24/2022 Weight: 228 lbs 01/04/2023 Weight: 234 lbs    AT/AF Burden 0%                                                      Attempted call to patient and unable to reach.  Left detailed message per DPR regarding transmission. Transmission reviewed.                  CorVue thoracic daily impedance suggesting normal fluid levels with exception of possible fluid accumulation from 10/31-11/6.   Prescribed:   Torsemide 40 mg Take 0.5 tablet(s) (20 mg total) by mouth twice a day.   Spironolactone 25 mg take 0.5 tablet (12.5 mg total) once a day   Labs: 03/05/2023 Creatinine 1.64, BUN 37, Potassium 4.5, Sodium 138  03/02/2023 Creatinine 1.48, BUN 29, Potassium 5.9, Sodium 140, GFR 50 (potassium addressed in ED) 02/26/2023 Creatinine 1.49, BUN 32, Potassium 5.5, Sodium 140, GFR 50  02/12/2023 Creatinine 1.64, BUN 47, Potassium 5.1, Sodium 137, GFR 44 A complete set of results can be found in Results Review.   Recommendations: Left voice mail with ICM number and encouraged to call if experiencing any fluid symptoms.   Follow-up plan: ICM clinic phone appointment on 05/07/2023.   91 day device clinic remote transmission 05/01/2023.     EP/Cardiology Office Visits:   Recall 02/14/2023 with Dr Jens Som.  04/17/2023 with Dr Graciela Husbands.   Copy of ICM check sent to Dr. Graciela Husbands.  3 month ICM trend: 03/19/2023.    12-14 Month ICM trend:     Karie Soda, RN 03/21/2023 4:09 PM

## 2023-03-21 NOTE — Telephone Encounter (Signed)
Remote ICM transmission received.  Attempted call to patient regarding ICM remote transmission and left detailed message per DPR.  Left ICM phone number and advised to return call for any fluid symptoms or questions. Next ICM remote transmission scheduled 05/07/2023.

## 2023-03-23 ENCOUNTER — Other Ambulatory Visit: Payer: Self-pay | Admitting: Internal Medicine

## 2023-03-28 ENCOUNTER — Other Ambulatory Visit (HOSPITAL_COMMUNITY): Payer: Self-pay

## 2023-04-04 NOTE — Progress Notes (Signed)
Office Visit Note  Patient: William Villanueva             Date of Birth: 04/03/1951           MRN: [id number]             PCP: Tally Joe, MD Referring: Tally Joe, MD Visit Date: 04/18/2023   Subjective:  Follow-up (Patient states the last time he was here he got a shot in the top of his right thumb and it did not work. Patient states his right thumb hurts at the bottom. Patient states he keeps getting hand cramps. Patient states he is having pain in his right shoulder. )   History of Present Illness:  Discussed the use of AI scribe software for clinical note transcription with the patient, who gave verbal consent to proceed.  History of Present Illness   William Villanueva is a 72 y.o. male here for follow up for gout and osteoarthritis on allopurinol 300 mg daily.  He presents with complaints of right shoulder pain, hand cramps, and a persistently swollen and discolored left foot. The shoulder pain, which has been worsening over the past six to seven months, is severe enough to disrupt sleep. The patient reports that a previous injection into the shoulder helped for a while but symptoms are severe again. Pain often radiating into his arm after certain movements.  In addition to shoulder pain, the patient has been experiencing severe hand cramps in both hands for the past couple of months. These cramps occur spontaneously, without any identifiable trigger.  The patient's left foot has been persistently swollen and discolored, turning a reddish-blue hue. This has been a constant issue, with the foot remaining swollen and discolored most of the time. The patient has seen two different foot doctors for this issue, both of whom recommended shoe inserts, but the patient remains skeptical about the effectiveness of this solution.  Right thumb MCP steroid injection at last visit did not help symptoms significantly.   Previous HPI 01/25/2023 William Villanueva is a 72 y.o. male here for  follow up for gout and osteoarthritis on allopurinol 300 mg daily.  He has numerous medical events since her last visit with hospitalization for heart failure decompensation.  Notes that podiatry evaluation for the persistent pain swelling discoloration in his foot despite well-controlled uric acid level.  Appears symptoms were attributed to peripheral neuropathy and edema primarily.  The benefit from steroid injections earlier this year is mostly worn off he is having a lot of pain at the base of his right thumb is the worst area.  Does not see a lot of visible swelling he has extensive easy bruising on both forearms.  Interested in repeat steroid injection today.  He also has a follow-up appointment with his back specialist coming up anticipates needing repeat injections.   Previous HPI 08/28/22 William Villanueva is a 72 y.o. male here for follow up for rheumatoid arthritis and gout on allopurinol 200 mg daily.  He felt a lot of benefit with the joint steroid injections from previous visit his right hand and left shoulder.  The shoulder remains largely improved although he still has mild symptoms there.  Right hand is giving him more trouble again especially at the thumb he is using a brace that is partially helpful.  No new definite gout flareups of inflammation.     Previous HPI 04/07/22 William Villanueva is a 72 y.o. male here for follow up for rheumatoid arthritis and gout currently on  allopurinol increased to 200 mg daily after our initial visit. He continues having joint pain and stiffness at multiple areas. Most problematic are his right thumb and his left shoulder. Swelling stays mostly at his feet and ankles. He got sick with RSV and delayed his planned pacemaker battery replacement so that is coming up. Still has a bit of increased cough mostly nonproductive.    Previous HPI 02/23/22 William Villanueva is a 73 y.o. male here for rheumatoid arthritis. He was previously seeing [facility]  rheumatology on treatment with methotrexate and had been on allopurinol and uloric for gout. He has history of carpal tunnel syndrome with surgical treatment for the left wrist.  Original diagnosis was RA he had joint pain in multiple areas with abnormal laboratory test.  He was started on injectable methotrexate but switched to oral methotrexate due to pain and difficulty with the injections.  He stopped following up with her clinic due to dissatisfaction including never seeing his MD and  not addressing any complaints not directly attributable to his inflammatory arthritis.  As result he has been off any disease specific medication for over a year does feel he has increased in joint pains off of treatment.  He had also been off of the Uloric they were prescribing but was restarted on allopurinol in the past few months with his PCP office.  He reports 1 flare of gout in his foot within the past month but before that had been free of any attacks for several months duration.  He still has residual swelling and erythema in the left foot but was told this may be coming from his heart failure edema as well.   Generally has daily joint pain in multiple areas particular including bilateral hands and in his back.  He gets foot and ankle pain most severe during gout flares has mild symptoms otherwise.  More recently also developed increased left shoulder pain for several days duration for which he went to urgent care for evaluation of this but ended up going to the hospital due to hypoxia and increased cough so the original problem was never addressed.  Treatment course at the hospital for congestive heart failure exacerbation and improved after several days.  He still had increased cough producing significant discolored sputum and received 1 dose of IV ceftriaxone.  X-ray of the shoulder was obtained demonstrated mild glenohumeral joint osteoarthritis and moderate AC joint osteoarthritis.   Labs reviewed 10/2021 Uric  acid 9.7 eGFR 59   Review of Systems  Constitutional:  Positive for fatigue.  HENT:  Positive for mouth dryness. Negative for mouth sores.   Eyes:  Positive for dryness.  Respiratory:  Positive for shortness of breath.   Cardiovascular:  Negative for chest pain and palpitations.  Gastrointestinal:  Positive for constipation and diarrhea. Negative for blood in stool.  Endocrine: Negative for increased urination.  Genitourinary:  Negative for involuntary urination.  Musculoskeletal:  Positive for joint pain, gait problem, joint pain, joint swelling, myalgias, muscle weakness, morning stiffness, muscle tenderness and myalgias.  Skin:  Positive for color change. Negative for rash, hair loss and sensitivity to sunlight.  Allergic/Immunologic: Negative for susceptible to infections.  Neurological:  Negative for dizziness and headaches.  Hematological:  Negative for swollen glands.  Psychiatric/Behavioral:  Positive for sleep disturbance. Negative for depressed mood. The patient is not nervous/anxious.     PMFS History:  Patient Active Problem List   Diagnosis Date Noted   Osteoarthritis of carpometacarpal [facility]) joint of right thumb 01/25/2023  Acute on chronic combined systolic and diastolic CHF (congestive heart failure) (HCC) 01/11/2023   Acute on chronic combined systolic (congestive) and diastolic (congestive) heart failure (HCC) 01/10/2023   CHF (congestive heart failure) (HCC) 01/08/2023   Acute on chronic systolic CHF (congestive heart failure) (HCC) 12/21/2022   CKD stage 3a, GFR 45-59 ml/min (HCC) 12/21/2022   Chronic respiratory failure with hypoxia (HCC) - prn 2 L/min home O2 12/21/2022   Bilateral pleural effusion 12/21/2022   Increased frequency of urination 10/09/2022   Nevus of scalp 10/09/2022   Rheumatoid arthritis (HCC) 02/23/2022   High risk medication use 02/23/2022   COPD (chronic obstructive pulmonary disease) (HCC) 02/10/2022   Class 2 obesity 02/08/2022   DM2  (diabetes mellitus, type 2) (HCC) 02/04/2022   Nocturnal hypoxemia 12/30/2021   Non-ischemic cardiomyopathy (HCC) 07/29/2020   Postural dizziness with presyncope 02/18/2020   Ulcerative colitis, acute, unspecified complication (HCC) 02/18/2020   Thrombocytosis 02/12/2020   Sprain of left wrist 08/12/2019   Depression 04/05/2016   Kidney stones 04/05/2016   Osteoarthritis 04/05/2016   OSA (obstructive sleep apnea) 04/05/2016   Bilateral carpal tunnel syndrome 12/02/2015   Chronic combined systolic and diastolic congestive heart failure (HCC) 04/13/2015   Orthostatic hypotension 01/19/2015   Obesity 11/26/2014   Enlarged prostate without lower urinary tract symptoms (luts) 10/29/2014   Essential hypertension 10/29/2014   GERD (gastroesophageal reflux disease) 10/29/2014   Panic attack 10/29/2014   Changing skin lesion 10/16/2014   Renal cyst 09/02/2014   Dyspnea on exertion 06/21/2014   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   Palpitations 06/11/2014   Myofascial pain 05/28/2014   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   Other cardiomyopathies (HCC) 04/29/2014   Chronic pain 02/12/2014   Lumbar radiculopathy 02/09/2014   Thrombocythemia 12/01/2013   Muscular wasting and disuse atrophy 08/15/2013   Degenerative lumbar spinal stenosis 08/06/2013   Lumbar and sacral osteoarthritis 05/19/2013   Congestive dilated cardiomyopathy (HCC) 01/11/2012   Back pain    Hemochromatosis    SOB (shortness of breath)    Gout    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 Jude Allure Quadra pulse generator [id number], model PM 3242   Back pain    Cardiomyopathy (HCC)    Nonischemic 45%.    CHF (congestive heart failure) (HCC)    Diabetes mellitus without complication (HCC)    Fracture of lumbar vertebra without spinal cord injury, sequela 10/09/2022   Gout    Hemochromatosis    Hypertension    Hypospadias 1951/04/18   born  with   Nephrolithiasis    Pacemaker lead malfunction-elevated threshold RV lead 07/31/2014   Polycythemia vera(238.4)     Family History  Problem Relation Age of Onset   Stroke Mother    Other Father        Deceased, car fell on him   Diabetes Sister    Hypertension Sister    Heart disease Maternal Grandmother        Pacemaker, MI   Past Surgical History:  Procedure Laterality Date   ANKLE SURGERY     BI-VENTRICULAR PACEMAKER INSERTION N/A 04/29/2014   Procedure: BI-VENTRICULAR PACEMAKER INSERTION (CRT-P);  Surgeon: Duke Salvia, MD; Laterality: Left  St. Jude Allure Quadra pulse generator [id number], model [place] 4540   CARDIAC SURGERY     COLONOSCOPY N/A 02/15/2020   Procedure: COLONOSCOPY;  Surgeon: Kerin Salen, MD;  Location: [facility] ENDOSCOPY;  Service: Gastroenterology;  Laterality: N/A;   ESOPHAGOGASTRODUODENOSCOPY (EGD) WITH PROPOFOL N/A 02/15/2020   Procedure: ESOPHAGOGASTRODUODENOSCOPY (EGD) WITH PROPOFOL;  Surgeon: Kerin Salen, MD;  Location: [facility] ENDOSCOPY;  Service: Gastroenterology;  Laterality: N/A;   LEAD INSERTION N/A 04/28/2022   Procedure: LEAD INSERTION;  Surgeon: Duke Salvia, MD;  Location: [facility] INVASIVE CV LAB;  Service: Cardiovascular;  Laterality: N/A;   PILONIDAL CYST EXCISION     POSTERIOR LAMINECTOMY / DECOMPRESSION LUMBAR SPINE     PPM GENERATOR CHANGEOUT N/A 04/28/2022   Procedure: PPM GENERATOR CHANGEOUT;  Surgeon: Duke Salvia, MD;  Location: [facility] INVASIVE CV LAB;  Service: Cardiovascular;  Laterality: N/A;   RIGHT/LEFT HEART CATH AND CORONARY ANGIOGRAPHY N/A 01/10/2023   Procedure: RIGHT/LEFT HEART CATH AND CORONARY ANGIOGRAPHY;  Surgeon: Marykay Lex, MD;  Location: [facility] INVASIVE CV LAB;  Service: Cardiovascular;  Laterality: N/A;   TONSILLECTOMY     Social History   Social History Narrative   Lives at home with wife in a one story home.  Has no children.  Does not work.  Getting workman's comp.  Education: 4 years trade school.    Immunization History   Administered Date(s) Administered   DTaP 01/26/2016   Fluad Quad(high Dose 65+) 03/07/2021, 03/02/2022   Influenza Split 01/21/2015   Influenza, High Dose Seasonal PF 02/23/2017, 03/22/2018, 01/10/2019, 03/01/2020   Influenza,inj,Quad PF,6+ Mos 01/21/2015, 01/27/2016, 02/05/2017   Influenza-Unspecified 01/26/2016   PFIZER Comirnaty(Gray Top)Covid-19 Tri-Sucrose Vaccine 09/10/2020   PFIZER(Purple Top)SARS-COV-2 Vaccination 06/21/2019, 07/13/2019, 04/16/2020, 09/10/2020   Pfizer Covid-19 Vaccine Bivalent Booster 56yrs & up 03/07/2021   Pfizer(Comirnaty)Fall Seasonal Vaccine 12 years and older 03/02/2022   Pneumococcal Conjugate-13 01/27/2016, 01/09/2018   Pneumococcal Polysaccharide-23 01/21/2015   Pneumococcal-Unspecified 01/26/2016   Tdap 01/26/2011, 01/27/2016, 08/09/2019   Zoster, Live 08/29/2019     Objective: Vital Signs: BP 109/68 (BP Location: Left Arm, Patient Position: Sitting, Cuff Size: Normal)   Pulse (!) 106   Resp 16   Ht 5\' 10"  (1.778 m)   Wt 236 lb (107 kg)   BMI 33.86 kg/m    Physical Exam Eyes:     Conjunctiva/sclera: Conjunctivae normal.  Cardiovascular:     Rate and Rhythm: Normal rate and regular rhythm.  Pulmonary:     Effort: Pulmonary effort is normal.     Breath sounds: Normal breath sounds.  Musculoskeletal:     Right lower leg: No edema.     Left lower leg: No edema.  Lymphadenopathy:     Cervical: No cervical adenopathy.  Skin:    General: Skin is warm and dry.     Findings: Bruising present.     Comments: Extensive bruising on extensor surfaces  Neurological:     Mental Status: He is alert.  Psychiatric:        Mood and Affect: Mood normal.      Musculoskeletal Exam:  Right shoulder pain tenderness to pressure lateral anterior, and superior sides, guarding against abduction and with external rotation ROM Elbows full ROM no tenderness or swelling Wrists full ROM no tenderness or swelling Right first CMC joint squaring with thenar  eminence atrophy, tenderness to pressure no palpable swelling Knees full ROM no tenderness or swelling, bilateral patellofemoral crepitus, left knee anterior bony nodule  Investigation: No additional findings.  Imaging: No results found.  Recent Labs: Lab Results  Component Value Date   WBC 9.5 03/05/2023   HGB 12.2 (L) 03/05/2023   PLT 806 (H) 03/05/2023   NA 138 03/05/2023   K 4.5 03/05/2023  CL 105 03/05/2023   CO2 22 03/05/2023   GLUCOSE 117 (H) 03/05/2023   BUN 37 (H) 03/05/2023   CREATININE 1.64 (H) 03/05/2023   BILITOT 0.7 03/05/2023   ALKPHOS 57 03/05/2023   AST 16 03/05/2023   ALT 13 03/05/2023   PROT 7.0 03/05/2023   ALBUMIN 3.8 03/05/2023   CALCIUM 8.6 (L) 03/05/2023   GFRAA 83 05/10/2020    Speciality Comments: No specialty comments available.  Procedures:  Large Joint Inj: R subacromial bursa on 04/18/2023 4:20 PM Indications: pain Details: 27 G 1.5 in needle, lateral approach Medications: 2 mL lidocaine 1 %; 40 mg triamcinolone acetonide 40 MG/ML Aspirate: sent for lab analysis Outcome: tolerated well, no immediate complications Procedure, treatment alternatives, risks and benefits explained, specific risks discussed. Consent was given by the patient. Immediately prior to procedure a time out was called to verify the correct patient, procedure, equipment, support staff and site/side marked as required. Patient was prepped and draped in the usual sterile fashion.     Allergies: Advil [ibuprofen], Keflex [cephalexin], Wellbutrin [bupropion], Prednisone, Temazepam, Trazodone and nefazodone, Sacubitril-valsartan, and Prozac [fluoxetine hcl]   Assessment / Plan:     Visit Diagnoses: Idiopathic chronic gout of multiple sites without tophus  Plan: allopurinol (ZYLOPRIM) 300 MG tablet  Right Shoulder Pain Pain for the past 6-7 months, exacerbated by sleeping on the right side. Physical examination reveals tenderness in the anterior shoulder. No imaging  available for review. -Subacromial bursa steroid injection today -Provided at home exercises for shoulder impingement  Hand Cramps Bilateral hand cramps for the past couple of months, occurring spontaneously. Unclear etiology. -Continue to monitor symptoms.  Left Foot Swelling and Discoloration Chronic issue with persistent redness and swelling. No clear diagnosis at this time. Patient has seen two podiatrists who recommended shoe inserts. No evidence of local joint inflammation. No appreciable edema.  Primary osteoarthritis of first carpometacarpal joint of right hand  Right Thumb Pain Chronic pain likely secondary to osteoarthritis. Previous injection provided minimal relief. -Consider referral to a hand surgeon for further evaluation and potential intervention, he prefers to hold off at this time  Gout No recent flares reported. Chronic issue with history of multiple joint involvement. -Continue allopurinol 300 mg daily     Orders: Orders Placed This Encounter  Procedures   Large Joint Inj   Meds ordered this encounter  Medications   allopurinol (ZYLOPRIM) 300 MG tablet    Sig: Take 1 tablet (300 mg total) by mouth daily.    Dispense:  90 tablet    Refill:  1     Follow-Up Instructions: Return in about 3 months (around 07/17/2023), or if symptoms worsen or fail to improve, for Gout/OA on allopurinol/inj f/u 3mos.   Fuller Plan, MD  Note - This record has been created using [employer].  Chart creation errors have been sought, but may not always  have been located. Such creation errors do not reflect on  the standard of medical care.

## 2023-04-17 ENCOUNTER — Ambulatory Visit: Payer: PPO | Attending: Internal Medicine | Admitting: Internal Medicine

## 2023-04-17 ENCOUNTER — Encounter: Payer: Self-pay | Admitting: Internal Medicine

## 2023-04-17 VITALS — BP 104/62 | HR 97 | Ht 70.0 in | Wt 230.0 lb

## 2023-04-17 DIAGNOSIS — I428 Other cardiomyopathies: Secondary | ICD-10-CM | POA: Diagnosis not present

## 2023-04-17 DIAGNOSIS — I472 Ventricular tachycardia, unspecified: Secondary | ICD-10-CM | POA: Diagnosis not present

## 2023-04-17 DIAGNOSIS — Z950 Presence of cardiac pacemaker: Secondary | ICD-10-CM

## 2023-04-17 DIAGNOSIS — I5022 Chronic systolic (congestive) heart failure: Secondary | ICD-10-CM | POA: Diagnosis not present

## 2023-04-17 NOTE — Progress Notes (Unsigned)
Electrophysiology Office Note   Date:  04/17/2023   ID:  William Villanueva, DOB [birth year], MRN [id number]  PCP:  Tally Joe, MD  Cardiologist:  [facility] Primary Electrophysiologist:    Sherryl Manges, MD    No chief complaint on file.    History of Present Illness: William Villanueva is a 72 y.o. male is   seen in follow-up for CRT-P implantation for nonischemic cardiomyopathy. He had presented to 12/15 with symptomatic high-grade heart block.  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; carries a diagnosis of cardiac hemochromatosis,    Without chest pain, chronic shortness of breath and complaints of his L foot>> red and purple swollen and desquamating   No LH or syncope but fatigue; dx with OSA but not able to tolerate CPAP   Orthostatic lightheadedness with some presyncope    DATE TEST EF   9/13 cMRI  34% LGE neg  2/16 Myoview  35%   3/17 Myoview    32 %   1/20 Echo  40-45%   9/21 Echo  30-35%   2/22 Echo  45-50%  Following ECG resynchronization  9/23 Echo  40% LAE severe 45/mm 2      Date Cr K Hgb BNP  9/19 1.44 4.1 13.7   9/21  1.56 3.8 15.2 2864  4/23 1.3 4.5 13.7   1/24 1.17 3.8 11.6   10/24 1.64 4.5 12.2      Past Medical History:  Diagnosis Date   AV block, 2nd degree 2015   St. Jude Allure Quadra pulse generator [id number], model PM 3242   Back pain    Cardiomyopathy (HCC)    Nonischemic 45%.    CHF (congestive heart failure) (HCC)    Diabetes mellitus without complication (HCC)    Fracture of lumbar vertebra without spinal cord injury, sequela 10/09/2022   Gout    Hemochromatosis    Hypertension    Hypospadias Jan 11, 1951   born with   Nephrolithiasis    Pacemaker lead malfunction-elevated threshold RV lead 07/31/2014   Polycythemia vera(238.4)    Past Surgical History:  Procedure Laterality Date   ANKLE SURGERY     BI-VENTRICULAR PACEMAKER INSERTION N/A 04/29/2014   Procedure: BI-VENTRICULAR PACEMAKER  INSERTION (CRT-P);  Surgeon: Duke Salvia, MD; Laterality: Left  St. Jude Allure Quadra pulse generator [id number], model [place] 0272   CARDIAC SURGERY     COLONOSCOPY N/A 02/15/2020   Procedure: COLONOSCOPY;  Surgeon: Kerin Salen, MD;  Location: [facility] ENDOSCOPY;  Service: Gastroenterology;  Laterality: N/A;   ESOPHAGOGASTRODUODENOSCOPY (EGD) WITH PROPOFOL N/A 02/15/2020   Procedure: ESOPHAGOGASTRODUODENOSCOPY (EGD) WITH PROPOFOL;  Surgeon: Kerin Salen, MD;  Location: [facility] ENDOSCOPY;  Service: Gastroenterology;  Laterality: N/A;   LEAD INSERTION N/A 04/28/2022   Procedure: LEAD INSERTION;  Surgeon: Duke Salvia, MD;  Location: [facility] INVASIVE CV LAB;  Service: Cardiovascular;  Laterality: N/A;   PILONIDAL CYST EXCISION     POSTERIOR LAMINECTOMY / DECOMPRESSION LUMBAR SPINE     PPM GENERATOR CHANGEOUT N/A 04/28/2022   Procedure: PPM GENERATOR CHANGEOUT;  Surgeon: Duke Salvia, MD;  Location: [facility] INVASIVE CV LAB;  Service: Cardiovascular;  Laterality: N/A;   RIGHT/LEFT HEART CATH AND CORONARY ANGIOGRAPHY N/A 01/10/2023   Procedure: RIGHT/LEFT HEART CATH AND CORONARY ANGIOGRAPHY;  Surgeon: Marykay Lex, MD;  Location: [facility] INVASIVE CV LAB;  Service: Cardiovascular;  Laterality: N/A;   TONSILLECTOMY       Current Outpatient Medications  Medication Sig Dispense Refill   acetaminophen (  TYLENOL) 500 MG tablet Take 1,000 mg by mouth every 6 (six) hours as needed for headache.     allopurinol (ZYLOPRIM) 300 MG tablet Take 1 tablet (300 mg total) by mouth daily. 90 tablet 1   Ascorbic Acid (VITAMIN C PO) Take by mouth.     budesonide (PULMICORT) 0.5 MG/2ML nebulizer solution Take 2 mLs (0.5 mg total) by nebulization 2 (two) times daily. (RINSE MOUTH AFTER USE) 360 mL 11   cholestyramine (QUESTRAN) 4 GM/DOSE powder Take 4 g by mouth daily as needed (diarrhea).     citalopram (CELEXA) 10 MG tablet Take 5 mg by mouth in the morning.     Cyanocobalamin (B-12 PO) Take 1 tablet by mouth daily.     diclofenac  Sodium (VOLTAREN) 1 % GEL Apply 2 g topically daily as needed (pain).     dicyclomine (BENTYL) 20 MG tablet Take 20 mg by mouth 2 (two) times daily.     empagliflozin (JARDIANCE) 10 MG TABS tablet Take 1 tablet (10 mg total) by mouth daily. 90 tablet 3   hydroxyurea (HYDREA) 500 MG capsule TAKE TWO CAPSULES BY MOUTH DAILY 160 capsule 4   insulin degludec (TRESIBA FLEXTOUCH) 100 UNIT/ML SOPN FlexTouch Pen Inject 16 Units into the skin daily after breakfast.     ipratropium-albuterol (DUONEB) 0.5-2.5 (3) MG/3ML SOLN Inhale one vial in nebulizer twice daily and in between if needed     losartan (COZAAR) 25 MG tablet TAKE 1/2 TABLET BY MOUTH DAILY 40 tablet 3   magnesium gluconate (MAGONATE) 500 MG tablet Take 500 mg by mouth in the morning and at bedtime.     metoprolol succinate (TOPROL XL) 25 MG 24 hr tablet Take 1 tablet (25 mg total) by mouth daily. 30 tablet 6   naphazoline-glycerin (CLEAR EYES REDNESS) 0.012-0.25 % SOLN Place 1-2 drops into both eyes daily as needed for eye irritation.     omeprazole (PRILOSEC) 20 MG capsule Take 20 mg by mouth in the morning and at bedtime.     oxybutynin (DITROPAN XL) 15 MG 24 hr tablet Take 15 mg by mouth in the morning.     sodium bicarbonate 650 MG tablet Take 1 tablet (650 mg total) by mouth 2 (two) times daily. 60 tablet 0   spironolactone (ALDACTONE) 25 MG tablet Take 0.5 tablets (12.5 mg total) by mouth daily. 45 tablet 3   tamsulosin (FLOMAX) 0.4 MG CAPS capsule Take 0.4 mg by mouth daily.     triamcinolone cream (KENALOG) 0.1 % Apply 1 Application topically 2 (two) times daily. (Patient taking differently: Apply 1 Application topically daily as needed (Rash).) 30 g 0   Turmeric (QC TUMERIC COMPLEX) 500 MG CAPS Take 1 tablet by mouth daily.     Torsemide 40 MG TABS Take 40 mg by mouth daily. May take extra dose in the afternoon if has increased swelling or if weight goes up by more than 3 lbs 90 tablet 1   No current facility-administered medications  for this visit.    Allergies:   Advil [ibuprofen], Keflex [cephalexin], Wellbutrin [bupropion], Prednisone, Temazepam, Trazodone and nefazodone, Sacubitril-valsartan, and Prozac [fluoxetine hcl]     PHYSICAL EXAM: VS:  BP 104/62   Pulse 97   Ht 5\' 10"  (1.778 m)   Wt 230 lb (104.3 kg)   SpO2 97%   BMI 33.00 kg/m  , BMI Body mass index is 33 kg/m. Well developed and well nourished in no acute distress HENT normal Neck supple with JVP-flat Clear Device pocket  well healed; without hematoma or erythema.  There is no tethering  Regular rate and rhythm, no  murmur Abd-soft with active BS No Clubbing cyanosis  edema Skin-warm and dry A & Oriented  Grossly normal sensory and motor function  ECG sinus P-synchronous/ AV  pacing 14/04/42  Device function is normal. Programming changes   See Paceart for details      Device interrogation is reviewed today in detail.  See PaceArt for details.     ASSESSMENT AND PLAN:  NICM  Oxygen dependent COPD  HFmrEF  Hypertension  Morbidly obese   ICD-CRT-St. Jude (high-voltage upgrade 12/23)   Orthostatic hypotension   Vascular changes in his foot  Functional status has been stable.  Albeit quite limited  Orthostatics remain a problem.  Discussed compression  BP is lowish --we will have to keep close attention given his history of orthostasis.  I reached out to Dr. Gery Pray for advice concerning what to do with his foot and we we will send him to [facility] to Dr. Ave Filter    Signed, Sherryl Manges, MD  04/17/2023 2:12 PM     [facility] [address] [place] [address] [phone] (office) [phone] (fax)

## 2023-04-17 NOTE — Patient Instructions (Signed)
Medication Instructions:  Your physician recommends that you continue on your current medications as directed. Please refer to the Current Medication list given to you today.  *If you need a refill on your cardiac medications before your next appointment, please call your pharmacy*   Lab Work: None ordered.  If you have labs (blood work) drawn today and your tests are completely normal, you will receive your results only by: MyChart Message (if you have MyChart) OR A paper copy in the mail If you have any lab test that is abnormal or we need to change your treatment, we will call you to review the results.   Testing/Procedures: None ordered.    Follow-Up: At [facility], you and your health needs are our priority.  As part of our continuing mission to provide you with exceptional heart care, we have created designated Provider Care Teams.  These Care Teams include your primary Cardiologist (physician) and Advanced Practice Providers (APPs -  Physician Assistants and Nurse Practitioners) who all work together to provide you with the care you need, when you need it.  We recommend signing up for the patient portal called "MyChart".  Sign up information is provided on this After Visit Summary.  MyChart is used to connect with patients for Virtual Visits (Telemedicine).  Patients are able to view lab/test results, encounter notes, upcoming appointments, etc.  Non-urgent messages can be sent to your provider as well.   To learn more about what you can do with MyChart, go to [email or web address].    Your next appointment:   12 months with Dr Graciela Husbands  Dr Graciela Husbands recommends Dr Ouida Sills of [facility] for further evaluation. [phone]

## 2023-04-18 ENCOUNTER — Encounter: Payer: Self-pay | Admitting: Internal Medicine

## 2023-04-18 ENCOUNTER — Ambulatory Visit: Payer: PPO | Attending: Internal Medicine | Admitting: Internal Medicine

## 2023-04-18 VITALS — BP 109/68 | HR 106 | Resp 16 | Ht 70.0 in | Wt 236.0 lb

## 2023-04-18 DIAGNOSIS — M1811 Unilateral primary osteoarthritis of first carpometacarpal joint, right hand: Secondary | ICD-10-CM

## 2023-04-18 DIAGNOSIS — M1A09X0 Idiopathic chronic gout, multiple sites, without tophus (tophi): Secondary | ICD-10-CM

## 2023-04-18 DIAGNOSIS — Z5181 Encounter for therapeutic drug level monitoring: Secondary | ICD-10-CM | POA: Diagnosis not present

## 2023-04-18 DIAGNOSIS — M7541 Impingement syndrome of right shoulder: Secondary | ICD-10-CM

## 2023-04-18 MED ORDER — ALLOPURINOL 300 MG PO TABS: 300.0000 mg | ORAL_TABLET | Freq: Every day | ORAL | 1 refills | Status: DC

## 2023-04-18 MED ORDER — TRIAMCINOLONE ACETONIDE 40 MG/ML IJ SUSP
40.0000 mg | INTRAMUSCULAR | Status: AC | PRN
  Administered 2023-04-18: 40 mg via INTRA_ARTICULAR

## 2023-04-18 MED ORDER — LIDOCAINE HCL 1 % IJ SOLN
2.0000 mL | INTRAMUSCULAR | Status: AC | PRN
  Administered 2023-04-18: 2 mL

## 2023-04-21 LAB — CUP PACEART INCLINIC DEVICE CHECK
Battery Remaining Longevity: 68 mo
Brady Statistic RA Percent Paced: 1.1 %
Brady Statistic RV Percent Paced: 98 %
Date Time Interrogation Session: 20241210151900
Implantable Lead Connection Status: 753985
Implantable Lead Connection Status: 753985
Implantable Lead Connection Status: 753985
Implantable Lead Implant Date: 20151223
Implantable Lead Implant Date: 20151223
Implantable Lead Implant Date: 20231222
Implantable Lead Location: 753858
Implantable Lead Location: 753859
Implantable Lead Location: 753860
Implantable Lead Model: 7122
Implantable Pulse Generator Implant Date: 20231222
Lead Channel Pacing Threshold Amplitude: 0.5 V
Lead Channel Pacing Threshold Amplitude: 0.5 V
Lead Channel Pacing Threshold Pulse Width: 0.5 ms
Lead Channel Pacing Threshold Pulse Width: 0.5 ms
Lead Channel Sensing Intrinsic Amplitude: 11.6 mV
Lead Channel Sensing Intrinsic Amplitude: 2.7 mV
Lead Channel Setting Pacing Amplitude: 1.25 V
Lead Channel Setting Pacing Amplitude: 2 V
Lead Channel Setting Pacing Amplitude: 2 V
Lead Channel Setting Pacing Pulse Width: 0.5 ms
Lead Channel Setting Pacing Pulse Width: 0.7 ms
Lead Channel Setting Sensing Sensitivity: 0.5 mV
Pulse Gen Serial Number: 5555736

## 2023-05-01 ENCOUNTER — Ambulatory Visit (INDEPENDENT_AMBULATORY_CARE_PROVIDER_SITE_OTHER): Payer: Medicare Other

## 2023-05-01 DIAGNOSIS — I441 Atrioventricular block, second degree: Secondary | ICD-10-CM | POA: Diagnosis not present

## 2023-05-01 LAB — CUP PACEART REMOTE DEVICE CHECK
Battery Remaining Longevity: 66 mo
Battery Remaining Percentage: 81 %
Battery Voltage: 2.99 V
Brady Statistic AP VP Percent: 1 %
Brady Statistic AP VS Percent: 1 %
Brady Statistic AS VP Percent: 97 %
Brady Statistic AS VS Percent: 1.5 %
Brady Statistic RA Percent Paced: 1 %
Date Time Interrogation Session: 20241224020017
HighPow Impedance: 80 Ohm
HighPow Impedance: 80 Ohm
Implantable Lead Connection Status: 753985
Implantable Lead Connection Status: 753985
Implantable Lead Connection Status: 753985
Implantable Lead Implant Date: 20151223
Implantable Lead Implant Date: 20151223
Implantable Lead Implant Date: 20231222
Implantable Lead Location: 753858
Implantable Lead Location: 753859
Implantable Lead Location: 753860
Implantable Lead Model: 7122
Implantable Pulse Generator Implant Date: 20231222
Lead Channel Impedance Value: 450 Ohm
Lead Channel Impedance Value: 490 Ohm
Lead Channel Impedance Value: 630 Ohm
Lead Channel Pacing Threshold Amplitude: 0.5 V
Lead Channel Pacing Threshold Amplitude: 0.75 V
Lead Channel Pacing Threshold Amplitude: 1.25 V
Lead Channel Pacing Threshold Pulse Width: 0.5 ms
Lead Channel Pacing Threshold Pulse Width: 0.7 ms
Lead Channel Pacing Threshold Pulse Width: 1 ms
Lead Channel Sensing Intrinsic Amplitude: 11.7 mV
Lead Channel Sensing Intrinsic Amplitude: 3.3 mV
Lead Channel Setting Pacing Amplitude: 1.25 V
Lead Channel Setting Pacing Amplitude: 2 V
Lead Channel Setting Pacing Amplitude: 2 V
Lead Channel Setting Pacing Pulse Width: 0.5 ms
Lead Channel Setting Pacing Pulse Width: 0.7 ms
Lead Channel Setting Sensing Sensitivity: 0.5 mV
Pulse Gen Serial Number: 5555736

## 2023-05-07 ENCOUNTER — Ambulatory Visit: Payer: PPO | Attending: Internal Medicine

## 2023-05-07 DIAGNOSIS — Z950 Presence of cardiac pacemaker: Secondary | ICD-10-CM

## 2023-05-07 DIAGNOSIS — I5022 Chronic systolic (congestive) heart failure: Secondary | ICD-10-CM

## 2023-05-11 ENCOUNTER — Inpatient Hospital Stay: Payer: PPO | Attending: Hematology & Oncology

## 2023-05-11 ENCOUNTER — Encounter: Payer: Self-pay | Admitting: Hematology & Oncology

## 2023-05-11 ENCOUNTER — Inpatient Hospital Stay: Payer: PPO | Admitting: Hematology & Oncology

## 2023-05-11 ENCOUNTER — Other Ambulatory Visit: Payer: Self-pay

## 2023-05-11 VITALS — BP 129/71 | HR 51 | Temp 98.4°F | Resp 21 | Wt 232.0 lb

## 2023-05-11 DIAGNOSIS — I441 Atrioventricular block, second degree: Secondary | ICD-10-CM | POA: Insufficient documentation

## 2023-05-11 DIAGNOSIS — D45 Polycythemia vera: Secondary | ICD-10-CM | POA: Diagnosis present

## 2023-05-11 DIAGNOSIS — Z7982 Long term (current) use of aspirin: Secondary | ICD-10-CM | POA: Insufficient documentation

## 2023-05-11 DIAGNOSIS — Z95810 Presence of automatic (implantable) cardiac defibrillator: Secondary | ICD-10-CM | POA: Insufficient documentation

## 2023-05-11 DIAGNOSIS — Z79899 Other long term (current) drug therapy: Secondary | ICD-10-CM

## 2023-05-11 LAB — CMP (CANCER CENTER ONLY)
ALT: 14 U/L (ref 0–44)
AST: 11 U/L — ABNORMAL LOW (ref 15–41)
Albumin: 4 g/dL (ref 3.5–5.0)
Alkaline Phosphatase: 66 U/L (ref 38–126)
Anion gap: 10 (ref 5–15)
BUN: 29 mg/dL — ABNORMAL HIGH (ref 8–23)
CO2: 24 mmol/L (ref 22–32)
Calcium: 9 mg/dL (ref 8.9–10.3)
Chloride: 107 mmol/L (ref 98–111)
Creatinine: 1.28 mg/dL — ABNORMAL HIGH (ref 0.61–1.24)
GFR, Estimated: 60 mL/min — ABNORMAL LOW (ref >60)
Glucose, Bld: 117 mg/dL — ABNORMAL HIGH (ref 70–99)
Potassium: 4.8 mmol/L (ref 3.5–5.1)
Sodium: 141 mmol/L (ref 135–145)
Total Bilirubin: 0.7 mg/dL (ref 0.0–1.2)
Total Protein: 6.9 g/dL (ref 6.5–8.1)

## 2023-05-11 LAB — CBC WITH DIFFERENTIAL (CANCER CENTER ONLY)
Abs Immature Granulocytes: 0.19 10*3/uL — ABNORMAL HIGH (ref 0.00–0.07)
Basophils Absolute: 0.2 10*3/uL — ABNORMAL HIGH (ref 0.0–0.1)
Basophils Relative: 2 %
Eosinophils Absolute: 0 10*3/uL (ref 0.0–0.5)
Eosinophils Relative: 0 %
HCT: 43.5 % (ref 39.0–52.0)
Hemoglobin: 13.3 g/dL (ref 13.0–17.0)
Immature Granulocytes: 2 %
Lymphocytes Relative: 8 %
Lymphs Abs: 0.8 10*3/uL (ref 0.7–4.0)
MCH: 31 pg (ref 26.0–34.0)
MCHC: 30.6 g/dL (ref 30.0–36.0)
MCV: 101.4 fL — ABNORMAL HIGH (ref 80.0–100.0)
Monocytes Absolute: 0.8 10*3/uL (ref 0.1–1.0)
Monocytes Relative: 7 %
Neutro Abs: 8.9 10*3/uL — ABNORMAL HIGH (ref 1.7–7.7)
Neutrophils Relative %: 81 %
Platelet Count: 672 10*3/uL — ABNORMAL HIGH (ref 150–400)
RBC: 4.29 MIL/uL (ref 4.22–5.81)
RDW: 17.1 % — ABNORMAL HIGH (ref 11.5–15.5)
WBC Count: 11 10*3/uL — ABNORMAL HIGH (ref 4.0–10.5)
nRBC: 0.3 % — ABNORMAL HIGH (ref 0.0–0.2)

## 2023-05-11 LAB — LACTATE DEHYDROGENASE: LDH: 284 U/L — ABNORMAL HIGH (ref 98–192)

## 2023-05-11 NOTE — Progress Notes (Signed)
 Hematology and Oncology Follow Up Visit  Lysle Yero [id number] Nov 07, 1950 73 y.o. 05/11/2023   Principle Diagnosis:  Polycythemia vera-JAK2 positive Heart block-Mobitz II  Current Therapy:   Hydrea  500 mg p.o.BID -dose changed on 08/12/2021 Aspirin  81 mg by mouth daily Phlebotomy to maintain hematocrit below 45%     Interim History:  Mr.  Tenbrink is back for followup.  We last saw him back in October.  Since then, he has been quite busy.  He has seen a rheumatologist.  He is having problems with his right wrist and shoulder.  He is on quite a few medications.  He does have quite a few ecchymoses on his arms.  He is only on baby aspirin .  He is on 5 different blood pressure medications.  His blood pressure is adequate.  He and his wife did go up to [place] I think over the holiday season.  That a wonderful time despite the horrible traffic.  He has had no cardiac issues.  He does have a defibrillator in/pacemaker in.  This was placed in December 2023.  He has had no bleeding.  He has had no obvious change in bowel or bladder habits.  He has had no flareups of diverticulitis.  Overall, I would say that his performance status is probably ECOG 2.     Medications:  Current Outpatient Medications:    acetaminophen  (TYLENOL ) 500 MG tablet, Take 1,000 mg by mouth every 6 (six) hours as needed for headache., Disp: , Rfl:    allopurinol  (ZYLOPRIM ) 300 MG tablet, Take 1 tablet (300 mg total) by mouth daily., Disp: 90 tablet, Rfl: 1   Ascorbic Acid (VITAMIN C PO), Take by mouth., Disp: , Rfl:    budesonide  (PULMICORT ) 0.5 MG/2ML nebulizer solution, Take 2 mLs (0.5 mg total) by nebulization 2 (two) times daily. (RINSE MOUTH AFTER USE), Disp: 360 mL, Rfl: 11   cholestyramine  (QUESTRAN ) 4 GM/DOSE powder, Take 4 g by mouth daily as needed (diarrhea)., Disp: , Rfl:    citalopram  (CELEXA ) 10 MG tablet, Take 5 mg by mouth in the morning., Disp: , Rfl:    Cyanocobalamin  (B-12 PO), Take 1  tablet by mouth daily., Disp: , Rfl:    diclofenac  Sodium (VOLTAREN ) 1 % GEL, Apply 2 g topically daily as needed (pain)., Disp: , Rfl:    dicyclomine  (BENTYL ) 20 MG tablet, Take 20 mg by mouth 2 (two) times daily., Disp: , Rfl:    empagliflozin  (JARDIANCE ) 10 MG TABS tablet, Take 1 tablet (10 mg total) by mouth daily., Disp: 90 tablet, Rfl: 3   hydroxyurea  (HYDREA ) 500 MG capsule, TAKE TWO CAPSULES BY MOUTH DAILY, Disp: 160 capsule, Rfl: 4   insulin  degludec (TRESIBA FLEXTOUCH) 100 UNIT/ML SOPN FlexTouch Pen, Inject 16 Units into the skin daily after breakfast., Disp: , Rfl:    losartan  (COZAAR ) 25 MG tablet, TAKE 1/2 TABLET BY MOUTH DAILY, Disp: 40 tablet, Rfl: 3   magnesium  gluconate (MAGONATE) 500 MG tablet, Take 500 mg by mouth in the morning and at bedtime., Disp: , Rfl:    naphazoline-glycerin  (CLEAR EYES REDNESS) 0.012-0.25 % SOLN, Place 1-2 drops into both eyes daily as needed for eye irritation. (Patient not taking: Reported on 04/18/2023), Disp: , Rfl:    omeprazole  (PRILOSEC) 20 MG capsule, Take 20 mg by mouth in the morning and at bedtime., Disp: , Rfl:    oxybutynin  (DITROPAN  XL) 15 MG 24 hr tablet, Take 15 mg by mouth in the morning., Disp: , Rfl:    sodium  bicarbonate 650 MG tablet, Take 1 tablet (650 mg total) by mouth 2 (two) times daily., Disp: 60 tablet, Rfl: 0   spironolactone  (ALDACTONE ) 25 MG tablet, Take 0.5 tablets (12.5 mg total) by mouth daily., Disp: 45 tablet, Rfl: 3   tamsulosin  (FLOMAX ) 0.4 MG CAPS capsule, Take 0.4 mg by mouth daily., Disp: , Rfl:    Torsemide  40 MG TABS, Take 40 mg by mouth daily. May take extra dose in the afternoon if has increased swelling or if weight goes up by more than 3 lbs, Disp: 90 tablet, Rfl: 1   Turmeric (QC TUMERIC COMPLEX) 500 MG CAPS, Take 1 tablet by mouth daily., Disp: , Rfl:   Allergies:  Allergies  Allergen Reactions   Advil [Ibuprofen] Itching   Keflex [Cephalexin] Diarrhea and Other (See Comments)    Caused C-diff, also     Wellbutrin [Bupropion] Hives and Other (See Comments)   Prednisone  Itching and Other (See Comments)    Abdominal pain, also    Temazepam  Other (See Comments)    Dizziness    Trazodone  And Nefazodone Other (See Comments)    Dizziness   Sacubitril -Valsartan  Other (See Comments)    Unknown   Prozac [Fluoxetine Hcl] Itching    Past Medical History, Surgical history, Social history, and Family History were reviewed and updated.  Review of Systems: Review of Systems  Constitutional: Negative.   HENT: Negative.    Eyes: Negative.   Respiratory:  Positive for shortness of breath.   Cardiovascular:  Positive for chest pain.  Gastrointestinal: Negative.   Genitourinary: Negative.   Musculoskeletal:  Positive for back pain.  Skin: Negative.   Neurological: Negative.   Endo/Heme/Allergies: Negative.   Psychiatric/Behavioral: Negative.      Physical Exam: Vital signs temperature 98.4.  Pulse 51.  Blood pressure 129/71.  Weight is 232 pounds.    Physical Exam Vitals reviewed.  HENT:     Head: Normocephalic and atraumatic.  Eyes:     Pupils: Pupils are equal, round, and reactive to light.  Cardiovascular:     Rate and Rhythm: Normal rate and regular rhythm.     Heart sounds: Normal heart sounds.  Pulmonary:     Effort: Pulmonary effort is normal.     Breath sounds: Normal breath sounds.  Abdominal:     General: Bowel sounds are normal.     Palpations: Abdomen is soft.  Musculoskeletal:        General: No tenderness or deformity. Normal range of motion.     Cervical back: Normal range of motion.  Lymphadenopathy:     Cervical: No cervical adenopathy.  Skin:    General: Skin is warm and dry.     Findings: No erythema or rash.  Neurological:     Mental Status: He is alert and oriented to person, place, and time.  Psychiatric:        Behavior: Behavior normal.        Thought Content: Thought content normal.        Judgment: Judgment normal.      Lab Results   Component Value Date   WBC 11.0 (H) 05/11/2023   HGB 13.3 05/11/2023   HCT 43.5 05/11/2023   MCV 101.4 (H) 05/11/2023   PLT 672 (H) 05/11/2023     Chemistry      Component Value Date/Time   NA 141 05/11/2023 0856   NA 140 03/02/2023 1304   NA 141 04/10/2017 0923   NA 140 04/05/2016 0739   K 4.8  05/11/2023 0856   K 3.5 04/10/2017 0923   K 4.2 04/05/2016 0739   CL 107 05/11/2023 0856   CL 108 04/10/2017 0923   CO2 24 05/11/2023 0856   CO2 24 04/10/2017 0923   CO2 20 (L) 04/05/2016 0739   BUN 29 (H) 05/11/2023 0856   BUN 29 (H) 03/02/2023 1304   BUN 12 04/10/2017 0923   BUN 14.7 04/05/2016 0739   CREATININE 1.28 (H) 05/11/2023 0856   CREATININE 1.1 04/10/2017 0923   CREATININE 1.1 04/05/2016 0739      Component Value Date/Time   CALCIUM  9.0 05/11/2023 0856   CALCIUM  8.4 04/10/2017 0923   CALCIUM  9.0 04/05/2016 0739   ALKPHOS 66 05/11/2023 0856   ALKPHOS 77 04/10/2017 0923   ALKPHOS 91 04/05/2016 0739   AST 11 (L) 05/11/2023 0856   AST 34 04/05/2016 0739   ALT 14 05/11/2023 0856   ALT 32 04/10/2017 0923   ALT 40 04/05/2016 0739   BILITOT 0.7 05/11/2023 0856   BILITOT 0.48 04/05/2016 0739      Impression and Plan: Mr. Molner is 73 year old gentleman with polycythemia vera.  We have not had a phlebotomize him for quite a while.  However, I have noticed that his hemoglobin is trending upward.  It  is certainly possible the polycythemia might be a little bit more active.  Will get had a keep a closer look on him.  We may have to phlebotomize him at some point this year.  He probably has not been phlebotomized for about 5 years.  Thankfully, his cardiac issues seem to be doing pretty stable right now.  I will plan to get him back in about 2 months.   Maude JONELLE Crease, MD 1/3/20259:47 AM

## 2023-05-14 ENCOUNTER — Ambulatory Visit: Payer: PPO | Admitting: Hematology & Oncology

## 2023-05-14 ENCOUNTER — Other Ambulatory Visit: Payer: PPO

## 2023-05-22 NOTE — Progress Notes (Deleted)
 Cardiology Office Note   Date:  05/22/2023  ID:  William Villanueva, DOB 1951-04-20, MRN [id number] PCP:  Rae Bugler, MD [facility] Cardiologist: Alexandria Angel, MD  Reason for visit: 4 month follow-up  History of Present Illness    William Villanueva is a 73 y.o. male with a hx of Dr. Audery Blazing in August 2013 with complaints of chest pain. Echo showed EF 35 to 40%. Cardiac catheterization September 2013 showed mild nonobstructive CAD with no hemodynamic evidence of restriction. Cardiac MRI September 2013 showed EF 34% with diffuse hypokinesis, no hyperenhancement or scar tissue and no evidence of cardiac hemochromatosis. Patient was admitted December 2015 with high degree AV block. Subsequently had CRT-P placed. Patient's device upgraded to CRT-D December 2023 after symptomatic high degree AV block noted.  Polycythemia vera, JAK2 positive followed by PE; carries a diagnosis of cardiac hemochromatosis.   He presented to the ED on 12/21/2022 for increasing shortness of breath and chest tightness worsened with exertion, 20 lb weight gain.  Discharged on torsemide  20 mg twice daily. To note patient is intolerant to Entresto  secondary to hypotension, ACE/ARB secondary to orthostasis. Cannot take SGLT2 secondary to risk of GU infections.   I last saw him in August 2024.  Patient felt short of breath with mild activity, significant weakness and fatigue.  Wife stated he sleeps a lot of the day.  I recommended left and right heart catheterization for further evaluation and referral to the heart failure clinic.  He underwent R/LHC on 01/10/2023 which showed severely elevated filling pressures consistent with acute on chronic combined systolic and diastolic heart failure. Patient was admitted for diuresis with IV Lasix . Patient down 4 L since admission. He was euvolemic upon discharge and instructed to take torsemide  40 mg daily with extra tablet as needed. Carvedilol  increased to 6.25 mg twice daily. He  was started on Jardiance  and spironolactone  12.5 mg daily.   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  -CRT-P 2015, upgraded to CRT-D in 2023 after symptomatic high degree AV block noted.  -Echo during CHF admit in August 2024: EF <20%, diffuse hypokinesis, inferior/septal/apical akinesis, severely reduced RV function, moderately elevated PA pressure, moderate BAE.  -[facility] 01/10/2023 showed no CAD, wedge pressure 30-34 mmHg, LVEDP 31, PAP mean 48/36 - 38 mmHg. CO 2.8.  (Admitted for diuresis)    -Appears to be near euvolemic on exam.   -Continue torsemide  40 mg twice daily, spironolactone  12.5 mg daily and carvedilol  3.125 mg twice daily. -To note patient is intolerant to Entresto  secondary to hypotension, ACE/ARB secondary to orthostasis. Cannot take SGLT2 secondary to risk of GU infections.  -He does not feel cool to me on exam. -I think he is at class IV/stage D heart failure with shortness of breath with mild exertion, extreme fatigue, weakness and lack of appetite.  His treatment is limited by blood pressure and kidney dysfunction.  Recommend left and right heart catheterization for further evaluation and referral to heart failure clinic.   Nonobstructive coronary artery disease -Left heart cath September 2024 -***  Hypertension -*** -Goal BP is <130/80.  Recommend DASH diet (high in vegetables, fruits, low-fat dairy products, whole grains, poultry, fish, and nuts and low in sweets, sugar-sweetened beverages, and red meats), salt restriction and increase physical activity.  Hyperlipidemia -*** -Recommend cholesterol lowering diets - Mediterranean diet, DASH diet, vegetarian diet, low-carbohydrate diet and avoidance of trans fats.   Discussed healthier choice substitutes.  Nuts, high-fiber foods, and fiber supplements may also improve lipids.    Obesity -Even a 5-10% weight loss can have cardiovascular benefits.   -Recommend moderate intensity activity for 30 minutes 5 days/week and the DASH diet.  Tobacco use  -Recommend tobacco cessation.  Reviewed physiologic effects of nicotine and the immediate-eventual benefits of quitting including improvement in cough/breathing and reduction in cardiovascular events.  Discussed quitting tips such as removing triggers and getting support from family/friends and Quitline [place]. -USPSTF recommends one-time screening for abdominal aortic aneurysm (AAA) by ultrasound in men 44 -61 years old who have ever smoked.      Disposition - Follow-up in May 2025 with Dr. Audery Blazing.     Objective / Physical Exam   EKG today: ***  Vital signs:  There were no vitals taken for this visit.    GEN: No acute distress NECK: No carotid bruits CARDIAC: ***RRR, no murmurs RESPIRATORY:  Clear to auscultation without rales, wheezing or rhonchi  EXTREMITIES: No edema  Assessment and Plan   ***   {Are you ordering a CV Procedure (e.g. stress test, cath, DCCV, TEE, etc)?   Press F2        :[id number]}    Signed, Naaman Au  05/22/2023 [facility]

## 2023-05-23 ENCOUNTER — Ambulatory Visit: Payer: PPO | Attending: Physician Assistant | Admitting: Physician Assistant

## 2023-05-23 VITALS — BP 108/60 | HR 98 | Ht 70.0 in | Wt 234.0 lb

## 2023-05-23 DIAGNOSIS — I5022 Chronic systolic (congestive) heart failure: Secondary | ICD-10-CM

## 2023-05-23 DIAGNOSIS — I428 Other cardiomyopathies: Secondary | ICD-10-CM

## 2023-05-23 DIAGNOSIS — I2510 Atherosclerotic heart disease of native coronary artery without angina pectoris: Secondary | ICD-10-CM

## 2023-05-23 MED ORDER — METOPROLOL SUCCINATE ER 25 MG PO TB24: 12.5000 mg | ORAL_TABLET | Freq: Every day | ORAL | 3 refills | Status: DC

## 2023-05-23 NOTE — Progress Notes (Signed)
 Cardiology Office Note   Date:  05/23/2023  ID:  William Villanueva, DOB 06-01-1950, MRN [id number] PCP:  Rae Bugler, MD [facility] Cardiologist: Alexandria Angel, MD  Reason for visit: 4 month follow-up  History of Present Illness    William Villanueva is a 73 y.o. male with a hx of Dr. Audery Blazing in August 2013 with complaints of chest pain. Echo showed EF 35 to 40%. Cardiac catheterization September 2013 showed mild nonobstructive CAD with no hemodynamic evidence of restriction. Cardiac MRI September 2013 showed EF 34% with diffuse hypokinesis, no hyperenhancement or scar tissue and no evidence of cardiac hemochromatosis. Patient was admitted December 2015 with high degree AV block. Subsequently had CRT-P placed. Patient's device upgraded to CRT-D December 2023 after symptomatic high degree AV block noted, syncope & VT.  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   He presented to the ED on 12/21/2022 for increasing shortness of breath and chest tightness worsened with exertion, 20 lb weight gain.  Discharged on torsemide  20 mg twice daily. To note patient is intolerant to Entresto  secondary to hypotension, ACE/ARB secondary to orthostasis. Cannot take SGLT2 secondary to risk of GU infections.   I last saw him in August 2024.  Patient felt short of breath with mild activity, significant weakness and fatigue.  Wife stated he sleeps a lot of the day.  I recommended left and right heart catheterization for further evaluation and referral to the heart failure clinic.  He underwent R/LHC on 01/10/2023 which showed severely elevated filling pressures consistent with acute on chronic combined systolic and diastolic heart failure. Patient was admitted for diuresis with IV Lasix . Patient down 4 L since admission. He was euvolemic upon discharge and instructed to take torsemide  40 mg daily with extra tablet as needed. Carvedilol  increased to 6.25 mg twice daily. He was started on Jardiance  and  spironolactone  12.5 mg daily.   Today, patient states continued shortness of breath with exertion.  He has shortness of breath walking from the bathroom to his recliner and coming to the office today.  In addition to heart failure, he is treated for COPD.  He denies recent wheezing.  He has a pulse oximeter at home but has not followed his O2 saturation.  He states he was previously on oxygen  and describes difficulty with long tubing.    He typically uses a cane to aid in ambulation.  His states his weight has been stable over the last few weeks with estimated dry weight 232 to 233 pounds.  He takes torsemide  40 mg once daily.  If he has weight gain of 3 to 5 pounds overnight, he will take another torsemide  40 mg.  It has been a while since he has taken a second tablet.  He denies significant lower extremity edema and PND.  He has an adjustable bed adjusted approximately 2 pillow height.  Patient denies lightheadedness.  He has had no signs of infection since starting Jardiance .  Spironolactone  was discontinued after today 5.9.  His ICD was adjusted in the Fall with BiV pacing now at 98%.   Objective / Physical Exam   Vital signs:  BP 108/60 (BP Location: Left Arm, Cuff Size: Large)   Pulse 98   Ht 5\' 10"  (1.778 m)   Wt 234 lb (106.1 kg)   SpO2 96%   BMI 33.58 kg/m     GEN: No acute distress NECK: No carotid bruits CARDIAC: RRR, no murmurs RESPIRATORY:  Clear to auscultation without rales, wheezing or rhonchi  EXTREMITIES: No LE edema  Assessment and Plan   Chronic combined systolic and diastolic heart failure, euvolemic Nonischemic cardiomyopathy -Echo 2013 with EF 35-40%, LHC 2013 with mild CAD; Cardiac MRI  2013 showed EF 34% with diffuse hypokinesis, no hyperenhancement or scar tissue and no evidence of cardiac hemochromatosis.  -CRT-P 2015, upgraded to CRT-D in 2023 after symptomatic high degree AV block noted, VT/syncope.  Now with 98% BiV pacing after adjustments -Echo during CHF  admit in August 2024: EF <20%, diffuse hypokinesis, inferior/septal/apical akinesis, severely reduced RV function, moderately elevated PA pressure, moderate BAE.  -[facility] 01/10/2023 showed no CAD, wedge pressure 30-34 mmHg, LVEDP 31, PAP mean 48/36 - 38 mmHg. CO 2.8.  (Admitted for diuresis) -Euvolemic on exam today.   -Continue Jardiance  10 mg once daily, losartan  12.5 mg daily (intolerant to Entresto  secondary to hypotension) and torsemide  40 mg once daily. -Continue torsemide  40 mg extra tablet as needed for weight gain of 3 to 5 pounds or increased lower extremity edema. -Carvedilol  has been discontinued sometime in he last 4 months, possibly due to orthostasis.  Recommend starting Toprol  XL 12.5 mg daily since recent cardiac index in normal range. -Recommend to check oxygen  saturations when he feels short of breath to better understand if he would benefit from O2 therapy. -Re-refer to heart failure clinic with class IV/stage D heart failure with shortness of breath with mild exertion and  fatigue.  GDMT has been limited by blood pressure and kidney dysfunction/hyperkalemia.    Disposition - Follow-up in May 2025 with Dr. Audery Blazing.  Refer to HF clinic for advanced HF, EF <20%.  Continue follow-up with Dr. Jacqulin Maus to follow CRT-D, hx of VT.   Signed, Conan December, PA-C  05/23/2023 [facility]

## 2023-05-23 NOTE — Patient Instructions (Signed)
 Medication Instructions:  Start Metoprolol  succinate 12.5 mg ( take 1/2 of a 25 mg Tablet Daily). *If you need a refill on your cardiac medications before your next appointment, please call your pharmacy*   Lab Work: No labs If you have labs (blood work) drawn today and your tests are completely normal, you will receive your results only by: MyChart Message (if you have MyChart) OR A paper copy in the mail If you have any lab test that is abnormal or we need to change your treatment, we will call you to review the results.   Testing/Procedures: No Testing   Follow-Up: At [facility], you and your health needs are our priority.  As part of our continuing mission to provide you with exceptional heart care, we have created designated Provider Care Teams.  These Care Teams include your primary Cardiologist (physician) and Advanced Practice Providers (APPs -  Physician Assistants and Nurse Practitioners) who all work together to provide you with the care you need, when you need it.  We recommend signing up for the patient portal called "MyChart".  Sign up information is provided on this After Visit Summary.  MyChart is used to connect with patients for Virtual Visits (Telemedicine).  Patients are able to view lab/test results, encounter notes, upcoming appointments, etc.  Non-urgent messages can be sent to your provider as well.   To learn more about what you can do with MyChart, go to [email or web address].    Your next appointment:   To Be Determined  Provider:   Alexandria Angel, MD     Other Instructions Recommended to check Oxygen  Saturation when experiencing Shortness of Breath.

## 2023-06-06 ENCOUNTER — Encounter: Payer: Self-pay | Admitting: Internal Medicine

## 2023-06-11 ENCOUNTER — Ambulatory Visit: Payer: PPO | Attending: Internal Medicine

## 2023-06-11 DIAGNOSIS — Z95810 Presence of automatic (implantable) cardiac defibrillator: Secondary | ICD-10-CM

## 2023-06-11 DIAGNOSIS — I5022 Chronic systolic (congestive) heart failure: Secondary | ICD-10-CM

## 2023-06-12 NOTE — Progress Notes (Unsigned)
EPIC Encounter for ICM Monitoring  Patient Name: William Villanueva is a 73 y.o. male Date: 06/12/2023 Primary Care Physican: Tally Joe, MD Primary Cardiologist: Crenshaw/Stoner (1st visit 2/11) Electrophysiologist: Joycelyn Schmid Pacing: 97%            07/17/2022 Weight:  217 lbs 09/20/2022 Weight: 227 lbs 10/24/2022 Weight: 228 lbs 01/04/2023 Weight: 234 lbs 06/12/2023 Weight: *** 232-233 lbs    AT/AF Burden <1%                                                      ***                 CorVue thoracic daily impedance unstable starting 12/30.   Suggesting possible fluid accumulation starting 12/31-1/13 followed by possible dryness 1/14-1/24 followed by return of possible fluid accumulation on 1/28.     Prescribed:   Torsemide 40 mg Take 1 tablet(s) (40 mg total) by mouth daily.  May take extra afternoon tablet for swelling or if weight goes up more than 3 pounds.     Spironolactone 25 mg take 0.5 tablet (12.5 mg total) once a day   Labs: 05/11/2023 Creatinine 1.28, BUN 29, Potassium 4.8, Sodium 141, GFR <60 03/05/2023 Creatinine 1.64, BUN 37, Potassium 4.5, Sodium 138  03/02/2023 Creatinine 1.48, BUN 29, Potassium 5.9, Sodium 140, GFR 50 (potassium addressed in ED) 02/26/2023 Creatinine 1.49, BUN 32, Potassium 5.5, Sodium 140, GFR 50  02/12/2023 Creatinine 1.64, BUN 47, Potassium 5.1, Sodium 137, GFR 44 A complete set of results can be found in Results Review.   Recommendations:  ***   Follow-up plan: ICM clinic phone appointment on 06/18/2023 to recheck fluid levels.   91 day device clinic remote transmission 07/31/2023.     EP/Cardiology Office Visits:  06/19/2023 with Dr Elwyn Lade, HF clinic.   Recall 12/5/2025with Dr Graciela Husbands.   Copy of ICM check sent to Dr. Graciela Husbands.   3 month ICM trend: 06/11/2023.    12-14 Month ICM trend:     Karie Soda, RN 06/12/2023 8:41 AM

## 2023-06-12 NOTE — Progress Notes (Signed)
 Remote ICD transmission.

## 2023-06-18 ENCOUNTER — Ambulatory Visit: Payer: PPO | Attending: Internal Medicine

## 2023-06-18 ENCOUNTER — Telehealth (HOSPITAL_COMMUNITY): Payer: Self-pay | Admitting: Cardiology

## 2023-06-18 DIAGNOSIS — Z95810 Presence of automatic (implantable) cardiac defibrillator: Secondary | ICD-10-CM

## 2023-06-18 DIAGNOSIS — I5022 Chronic systolic (congestive) heart failure: Secondary | ICD-10-CM

## 2023-06-18 NOTE — Progress Notes (Signed)
 EPIC Encounter for ICM Monitoring  Patient Name: William Villanueva is a 73 y.o. male Date: 06/18/2023 Primary Care Physican: Rae Bugler, MD Primary Cardiologist: Crenshaw/Stoner (1st visit 2/11) Electrophysiologist: Victorino Grates Pacing: 96%            07/17/2022 Weight:  217 lbs 09/20/2022 Weight: 227 lbs 10/24/2022 Weight: 228 lbs 01/04/2023 Weight: 234 lbs 06/12/2023 Weight: 227 lbs 06/18/2023 Weight: 230 lbs    AT/AF Burden <1%                                                      Spoke with patient and heart failure questions reviewed.  Transmission results reviewed.  Pt asymptomatic for fluid accumulation.   Urine output has been good with extra Torsemide .   Diet:  Does not salt food but not monitoring salt content of foods he eats.   He drinks < 64 oz fluid daily                        CorVue thoracic daily impedance suggesting possible fluid accumulation starting 1/28 and returned close to normal 2/10 after taking Torsemide  40 mg twice a day x 2 days.  Thoracic daily impedance unstable starting 12/30.   Prescribed:   Torsemide  40 mg Take 1 tablet(s) (40 mg total) by mouth daily.  May take extra afternoon tablet for swelling or if weight goes up more than 3 pounds.     Spironolactone  25 mg take 0.5 tablet (12.5 mg total) once a day   Labs: 05/11/2023 Creatinine 1.28, BUN 29, Potassium 4.8, Sodium 141, GFR <60 03/05/2023 Creatinine 1.64, BUN 37, Potassium 4.5, Sodium 138  03/02/2023 Creatinine 1.48, BUN 29, Potassium 5.9, Sodium 140, GFR 50 (potassium addressed in ED) 02/26/2023 Creatinine 1.49, BUN 32, Potassium 5.5, Sodium 140, GFR 50  02/12/2023 Creatinine 1.64, BUN 47, Potassium 5.1, Sodium 137, GFR 44 A complete set of results can be found in Results Review.   Recommendations:   Discussed up coming 1st visit to Dr Alease Amend at [facility].     Follow-up plan: ICM clinic phone appointment on 07/02/2023 to recheck fluid levels.   91 day device clinic  remote transmission 07/31/2023.     EP/Cardiology Office Visits:  06/19/2023 with Dr Alease Amend, HF clinic.   Recall 04/11/2024 with Dr Rodolfo Clan.   Copy of ICM check sent to Dr. Rodolfo Clan.  3 month ICM trend: 06/18/2023.    12-14 Month ICM trend:     Almyra Jain, RN 06/18/2023 3:19 PM

## 2023-06-19 ENCOUNTER — Ambulatory Visit (HOSPITAL_COMMUNITY)
Admission: RE | Admit: 2023-06-19 | Discharge: 2023-06-19 | Disposition: A | Discharge status: Home / Self Care | Payer: PPO | Source: Ambulatory Visit | Attending: Cardiology | Admitting: Cardiology

## 2023-06-19 ENCOUNTER — Encounter (HOSPITAL_COMMUNITY): Payer: Self-pay | Admitting: Cardiology

## 2023-06-19 VITALS — BP 100/60 | HR 73

## 2023-06-19 DIAGNOSIS — I5022 Chronic systolic (congestive) heart failure: Secondary | ICD-10-CM | POA: Diagnosis not present

## 2023-06-19 DIAGNOSIS — G4733 Obstructive sleep apnea (adult) (pediatric): Secondary | ICD-10-CM | POA: Insufficient documentation

## 2023-06-19 DIAGNOSIS — E785 Hyperlipidemia, unspecified: Secondary | ICD-10-CM | POA: Insufficient documentation

## 2023-06-19 DIAGNOSIS — I5084 End stage heart failure: Secondary | ICD-10-CM | POA: Insufficient documentation

## 2023-06-19 DIAGNOSIS — Z794 Long term (current) use of insulin: Secondary | ICD-10-CM | POA: Insufficient documentation

## 2023-06-19 DIAGNOSIS — N1831 Chronic kidney disease, stage 3a: Secondary | ICD-10-CM | POA: Diagnosis not present

## 2023-06-19 DIAGNOSIS — E1122 Type 2 diabetes mellitus with diabetic chronic kidney disease: Secondary | ICD-10-CM | POA: Insufficient documentation

## 2023-06-19 DIAGNOSIS — I130 Hypertensive heart and chronic kidney disease with heart failure and stage 1 through stage 4 chronic kidney disease, or unspecified chronic kidney disease: Secondary | ICD-10-CM | POA: Diagnosis not present

## 2023-06-19 DIAGNOSIS — Z95810 Presence of automatic (implantable) cardiac defibrillator: Secondary | ICD-10-CM | POA: Diagnosis not present

## 2023-06-19 LAB — COMPREHENSIVE METABOLIC PANEL
ALT: 10 U/L (ref 0–44)
AST: 12 U/L — ABNORMAL LOW (ref 15–41)
Albumin: 3.5 g/dL (ref 3.5–5.0)
Alkaline Phosphatase: 62 U/L (ref 38–126)
Anion gap: 9 (ref 5–15)
BUN: 25 mg/dL — ABNORMAL HIGH (ref 8–23)
CO2: 24 mmol/L (ref 22–32)
Calcium: 8.5 mg/dL — ABNORMAL LOW (ref 8.9–10.3)
Chloride: 104 mmol/L (ref 98–111)
Creatinine, Ser: 1.38 mg/dL — ABNORMAL HIGH (ref 0.61–1.24)
GFR, Estimated: 54 mL/min — ABNORMAL LOW (ref >60)
Glucose, Bld: 113 mg/dL — ABNORMAL HIGH (ref 70–99)
Potassium: 4.1 mmol/L (ref 3.5–5.1)
Sodium: 137 mmol/L (ref 135–145)
Total Bilirubin: 1 mg/dL (ref 0.0–1.2)
Total Protein: 6.9 g/dL (ref 6.5–8.1)

## 2023-06-19 MED ORDER — TORSEMIDE 20 MG PO TABS: 40.0000 mg | ORAL_TABLET | Freq: Two times a day (BID) | ORAL | 3 refills | Status: DC

## 2023-06-19 NOTE — Patient Instructions (Signed)
Medication Changes:  TORSEMIDE 40MG  TWICE DAILY   Lab Work:  Labs done today, your results will be available in MyChart, we will contact you for abnormal readings.  PLEASE GO TO [facility] IN [place] FOR THE URINE TESTING   [facility]---Address: [address] [place], [place] [address]  Testing/Procedures:  You have been ordered a PYP Scan.  This is done at [facility], they will call you to schedule.  When you come for this test please plan to be there 2-3 hours.  They are located at: [address] [place], [place] [address] SOMEONE WILL CALL TO GET YOU SCHEDULED FOR THIS ONCE APPROVED WITH INSURANCE   Follow-Up in: 1 MONTH AS SCHEDULED   At the [facility], you and your health needs are our priority. We have a designated team specialized in the treatment of Heart Failure. This Care Team includes your primary Heart Failure Specialized Cardiologist (physician), Advanced Practice Providers (APPs- Physician Assistants and Nurse Practitioners), and Pharmacist who all work together to provide you with the care you need, when you need it.   You may see any of the following providers on your designated Care Team at your next follow up:  Dr. Arvilla Meres Dr. Marca Ancona Dr. Dorthula Nettles Dr. Theresia Bough Tonye Becket, NP Robbie Lis, [place] [facility] [place], [place] Brynda Peon, NP [place] Lee, NP Karle Plumber, PharmD   Please be sure to bring in all your medications bottles to every appointment.   Need to Contact us:  If you have any questions or concerns before your next appointment please send [place] a message through [place] or call our office at [phone].    TO LEAVE A MESSAGE FOR THE NURSE SELECT OPTION 2, PLEASE LEAVE A MESSAGE INCLUDING: YOUR NAME DATE OF BIRTH CALL BACK NUMBER REASON FOR CALL**this is important as we prioritize the call backs  YOU WILL RECEIVE A CALL BACK THE SAME DAY AS LONG AS YOU CALL BEFORE 4:00 PM

## 2023-06-20 ENCOUNTER — Encounter: Payer: Self-pay | Admitting: Hematology & Oncology

## 2023-06-20 DIAGNOSIS — I5022 Chronic systolic (congestive) heart failure: Secondary | ICD-10-CM | POA: Diagnosis not present

## 2023-06-20 LAB — KAPPA/LAMBDA LIGHT CHAINS
Kappa free light chain: 46.7 mg/L — ABNORMAL HIGH (ref 3.3–19.4)
Kappa, lambda light chain ratio: 1.5 (ref 0.26–1.65)
Lambda free light chains: 31.2 mg/L — ABNORMAL HIGH (ref 5.7–26.3)

## 2023-06-20 NOTE — Progress Notes (Signed)
ADVANCED HEART FAILURE NEW PATIENT CLINIC NOTE  Referring Physician: Tally Joe, MD  Primary Care: Tally Joe, MD Primary Cardiologist:  HPI: William Villanueva is a 73 y.o. male with a PMH of nonischemic chronic systolic heart failure, heart block s/p CRT-D, diabetes, CKD, HTN, HLD, OSA who presents for initial visit for further evaluation and treatment of heart failure/cardiomyopathy.      Evaluated by Dr. Jens Som in August 2013 with complaints of chest pain. Echo showed EF 35 to 40%. Cardiac catheterization September 2013 showed mild nonobstructive CAD with no hemodynamic evidence of restriction. Cardiac MRI September 2013 showed EF 34% with diffuse hypokinesis, no hyperenhancement or scar tissue and no evidence of cardiac hemochromatosis.  Patient was admitted December 2015 with high degree AV block. Subsequently had CRT-P placed. In the last year has had an increase in the number of admissions for volume overload and it appears a precipitous decline in his functional status.     SUBJECTIVE: Patient reports significant worsening in his functional status over the past few years.  He could not recall the last time that he felt well.  States that he has "good days" and bad days, but it appears that all that is required for a bad day as any sort of exertion.  He is short of breath with even minimal exertion and is increasingly fatigued and tired.  He felt mildly improved after diuresis in the hospital but not significantly. Also has significant neuropathy pain.  PMH, current medications, allergies, social history, and family history reviewed in epic.  PHYSICAL EXAM: Vitals:   06/19/23 1145  BP: 100/60  Pulse: 73  SpO2: 97%   GENERAL: Well nourished and in no apparent distress at rest.  PULM:  Normal work of breathing, clear to auscultation bilaterally. Respirations are unlabored.  CARDIAC:  JVP mildly elevated         Normal rate with regular rhythm. No murmurs, rubs or  gallops.  1+ LE edema. Warm and well perfused extremities. ABDOMEN: Soft, non-tender, non-distended. NEUROLOGIC: Patient is oriented x3 with no focal or lateralizing neurologic deficits.    DATA REVIEW  ECG: BiV paced    ECHO: 01/2022:EF 40%, Grade II DD, mild RVH, severe LAE, mild RAE, mild MR  12/2022: Severely reduced EF, less than 20%, global hypokinesis with severe dilation, normal RV size, function moderately reduced  CATH: R/LHC 01/10/2023: Angiographically normal coronary arteries with large caliber PDA and PL system and large OM branches and diagonal branches but diminutive LAD after D3. RHC with RAP 20 mmHg, RVP having EDP 49/19-22 mmHg; PAP-mean 48/36-38 mmHg, PCWP average 30 to 34 mmHg, TD CO/CI 5.35/2.4   Heart failure review: - Classification: Heart failure with reduced EF - Etiology: Work up ongoing - NYHA Class: IIIb-IV - Volume status: Hypervolemic - ACEi/ARB/ARNI: Intolerant - Aldosterone antagonist: Maximally tolerated dose - Beta-blocker: Maximally tolerated dose - Digoxin: Plan to start at a subsequent visit - Hydralazine/Nitrates: Not a candidate - SGLT2i: Maximally tolerated dose - GLP-1: Not a candidate - Advanced therapies:  RHC to evaluate - ICD: Already in place   ASSESSMENT & PLAN:  Chronic systolic heart failure: Patient with borderline ACC/AHA stage D cardiomyopathy with NYHA class IIIb symptoms.  Recent right heart cath with preserved cardiac output but severely elevated filling pressures.  Given his severe restrictive disease and progressive cardiomyopathy we will rule out cardiac amyloid, but will plan for right heart catheterization with potential inpatient admission for optimization and LVAD workup at next visit. - AL amyloid  labs, PYP - RHC at next visit - Increase torsemide to 40mg  BID - Continue jardiance 10mg  daily - Continue spironolactone 12.5mg  daily, K recently borderline - Continue metoprolol 12.5mg  daily  CKD stage IIIa: Stable  disease currently. - Continue to monitor, gentle diuresis  CHB: CRT-D in place, follows with EP  HTN: Management as above  HLD: - Continue statin  Diabetes:  - Currently on insulin, follows with PCP, last A1c 5.6    Follow up in 1 month  Clearnce Hasten, MD Advanced Heart Failure Mechanical Circulatory Support 06/20/23

## 2023-06-22 DIAGNOSIS — K529 Noninfective gastroenteritis and colitis, unspecified: Secondary | ICD-10-CM | POA: Diagnosis not present

## 2023-06-22 LAB — MULTIPLE MYELOMA PANEL, SERUM
Albumin SerPl Elph-Mcnc: 3.6 g/dL (ref 2.9–4.4)
Albumin/Glob SerPl: 1.3 (ref 0.7–1.7)
Alpha 1: 0.3 g/dL (ref 0.0–0.4)
Alpha2 Glob SerPl Elph-Mcnc: 0.7 g/dL (ref 0.4–1.0)
B-Globulin SerPl Elph-Mcnc: 0.9 g/dL (ref 0.7–1.3)
Gamma Glob SerPl Elph-Mcnc: 1 g/dL (ref 0.4–1.8)
Globulin, Total: 2.9 g/dL (ref 2.2–3.9)
IgA: 264 mg/dL (ref 61–437)
IgG (Immunoglobin G), Serum: 1109 mg/dL (ref 603–1613)
IgM (Immunoglobulin M), Srm: 31 mg/dL (ref 15–143)
Total Protein ELP: 6.5 g/dL (ref 6.0–8.5)

## 2023-06-25 DIAGNOSIS — K5190 Ulcerative colitis, unspecified, without complications: Secondary | ICD-10-CM | POA: Diagnosis not present

## 2023-06-25 DIAGNOSIS — I5042 Chronic combined systolic (congestive) and diastolic (congestive) heart failure: Secondary | ICD-10-CM | POA: Diagnosis not present

## 2023-06-25 DIAGNOSIS — G250 Essential tremor: Secondary | ICD-10-CM | POA: Diagnosis not present

## 2023-06-25 DIAGNOSIS — E7800 Pure hypercholesterolemia, unspecified: Secondary | ICD-10-CM | POA: Diagnosis not present

## 2023-06-25 DIAGNOSIS — E1122 Type 2 diabetes mellitus with diabetic chronic kidney disease: Secondary | ICD-10-CM | POA: Diagnosis not present

## 2023-06-25 DIAGNOSIS — N1831 Chronic kidney disease, stage 3a: Secondary | ICD-10-CM | POA: Diagnosis not present

## 2023-06-25 DIAGNOSIS — M47812 Spondylosis without myelopathy or radiculopathy, cervical region: Secondary | ICD-10-CM | POA: Diagnosis not present

## 2023-06-25 DIAGNOSIS — Z23 Encounter for immunization: Secondary | ICD-10-CM | POA: Diagnosis not present

## 2023-06-25 DIAGNOSIS — I130 Hypertensive heart and chronic kidney disease with heart failure and stage 1 through stage 4 chronic kidney disease, or unspecified chronic kidney disease: Secondary | ICD-10-CM | POA: Diagnosis not present

## 2023-06-25 DIAGNOSIS — M109 Gout, unspecified: Secondary | ICD-10-CM | POA: Diagnosis not present

## 2023-06-25 DIAGNOSIS — J449 Chronic obstructive pulmonary disease, unspecified: Secondary | ICD-10-CM | POA: Diagnosis not present

## 2023-06-25 DIAGNOSIS — J9611 Chronic respiratory failure with hypoxia: Secondary | ICD-10-CM | POA: Diagnosis not present

## 2023-06-25 LAB — PROTEIN ELECTROPHORESIS, URINE REFLEX
Albumin ELP, Urine: 0 %
Alpha-1-Globulin, U: 0 %
Alpha-2-Globulin, U: 0 %
Beta Globulin, U: 0 %
Gamma Globulin, U: 0 %
Protein, Ur: <4 mg/dL

## 2023-06-26 ENCOUNTER — Encounter: Payer: Self-pay | Admitting: Hematology & Oncology

## 2023-06-27 ENCOUNTER — Ambulatory Visit: Payer: PPO | Admitting: Internal Medicine

## 2023-06-29 ENCOUNTER — Encounter (HOSPITAL_COMMUNITY): Payer: Self-pay | Admitting: Cardiology

## 2023-06-29 ENCOUNTER — Telehealth (HOSPITAL_COMMUNITY): Payer: Self-pay | Admitting: *Deleted

## 2023-06-29 DIAGNOSIS — D631 Anemia in chronic kidney disease: Secondary | ICD-10-CM | POA: Diagnosis not present

## 2023-06-29 DIAGNOSIS — R809 Proteinuria, unspecified: Secondary | ICD-10-CM | POA: Diagnosis not present

## 2023-06-29 DIAGNOSIS — I5020 Unspecified systolic (congestive) heart failure: Secondary | ICD-10-CM | POA: Diagnosis not present

## 2023-06-29 DIAGNOSIS — I129 Hypertensive chronic kidney disease with stage 1 through stage 4 chronic kidney disease, or unspecified chronic kidney disease: Secondary | ICD-10-CM | POA: Diagnosis not present

## 2023-06-29 DIAGNOSIS — N189 Chronic kidney disease, unspecified: Secondary | ICD-10-CM | POA: Diagnosis not present

## 2023-06-29 DIAGNOSIS — N2581 Secondary hyperparathyroidism of renal origin: Secondary | ICD-10-CM | POA: Diagnosis not present

## 2023-06-29 DIAGNOSIS — N1831 Chronic kidney disease, stage 3a: Secondary | ICD-10-CM | POA: Diagnosis not present

## 2023-06-29 NOTE — Telephone Encounter (Signed)
 Pt given instructions for MPI study.

## 2023-07-02 ENCOUNTER — Ambulatory Visit: Payer: Medicare Other | Attending: Internal Medicine

## 2023-07-02 DIAGNOSIS — Z95810 Presence of automatic (implantable) cardiac defibrillator: Secondary | ICD-10-CM

## 2023-07-02 DIAGNOSIS — I5022 Chronic systolic (congestive) heart failure: Secondary | ICD-10-CM

## 2023-07-03 NOTE — Progress Notes (Signed)
 EPIC Encounter for ICM Monitoring  Patient Name: William Villanueva is a 73 y.o. male Date: 07/03/2023 Primary Care Physican: Tally Joe, MD Primary Cardiologist: Crenshaw/Stoner (1st visit 2/11) Electrophysiologist: Joycelyn Schmid Pacing: 96%            07/17/2022 Weight:  217 lbs 09/20/2022 Weight: 227 lbs 10/24/2022 Weight: 228 lbs 01/04/2023 Weight: 234 lbs 06/12/2023 Weight: 227 lbs 06/18/2023 Weight: 230 lbs 07/03/2023 Weight: 227-230 lbs    AT/AF Burden <1%                                                      Spoke with patient and heart failure questions reviewed.  Transmission results reviewed.  Pt asymptomatic for fluid accumulation.   He reports he and William Villanueva (HF clinic) dicussed the need for a heart transplant which was very surprising to him.   Diet:  Does not salt food but not monitoring salt content of foods he eats.   He drinks < 64 oz fluid daily                        CorVue thoracic daily impedance suggesting fluid levels returned to normal after Torsemide increased to bid.   Prescribed:   Torsemide 40 mg Take 1 tablet(s) (40 mg total) by mouth twice a day.     Spironolactone 25 mg take 0.5 tablet (12.5 mg total) once a day   Labs: 07/02/2023 Creatinine 1.38, BUN 25, Potassium 4.1, Sodium 137, GFR 54 05/11/2023 Creatinine 1.28, BUN 29, Potassium 4.8, Sodium 141, GFR <60 03/05/2023 Creatinine 1.64, BUN 37, Potassium 4.5, Sodium 138  03/02/2023 Creatinine 1.48, BUN 29, Potassium 5.9, Sodium 140, GFR 50 (potassium addressed in ED) 02/26/2023 Creatinine 1.49, BUN 32, Potassium 5.5, Sodium 140, GFR 50  02/12/2023 Creatinine 1.64, BUN 47, Potassium 5.1, Sodium 137, GFR 44 A complete set of results can be found in Results Review.   Recommendations:   No changes and encouraged to call if experiencing any fluid symptoms.   Follow-up plan: ICM clinic phone appointment on 07/16/2023.   91 day device clinic remote transmission 07/31/2023.     EP/Cardiology Office Visits:  07/18/2023 with William Villanueva, HF clinic.   Recall 04/11/2024 with William Villanueva.   Copy of ICM check sent to William. Graciela Villanueva.  3 month ICM trend: 07/02/2023.    12-14 Month ICM trend:     Karie Soda, RN 07/03/2023 1:37 PM

## 2023-07-04 ENCOUNTER — Inpatient Hospital Stay: Payer: Medicare Other | Attending: Hematology & Oncology

## 2023-07-04 ENCOUNTER — Other Ambulatory Visit: Payer: Self-pay | Admitting: Medical Oncology

## 2023-07-04 ENCOUNTER — Encounter: Payer: Self-pay | Admitting: Hematology & Oncology

## 2023-07-04 ENCOUNTER — Inpatient Hospital Stay (HOSPITAL_BASED_OUTPATIENT_CLINIC_OR_DEPARTMENT_OTHER): Payer: Medicare Other | Admitting: Hematology & Oncology

## 2023-07-04 ENCOUNTER — Other Ambulatory Visit: Payer: Self-pay

## 2023-07-04 ENCOUNTER — Inpatient Hospital Stay: Payer: Medicare Other

## 2023-07-04 VITALS — BP 103/51 | HR 86 | Temp 97.8°F | Resp 18 | Ht 70.0 in | Wt 227.0 lb

## 2023-07-04 DIAGNOSIS — Z79899 Other long term (current) drug therapy: Secondary | ICD-10-CM | POA: Insufficient documentation

## 2023-07-04 DIAGNOSIS — D45 Polycythemia vera: Secondary | ICD-10-CM | POA: Insufficient documentation

## 2023-07-04 DIAGNOSIS — I429 Cardiomyopathy, unspecified: Secondary | ICD-10-CM | POA: Insufficient documentation

## 2023-07-04 LAB — CMP (CANCER CENTER ONLY)
ALT: 12 U/L (ref 0–44)
AST: 15 U/L (ref 15–41)
Albumin: 4.2 g/dL (ref 3.5–5.0)
Alkaline Phosphatase: 61 U/L (ref 38–126)
Anion gap: 9 (ref 5–15)
BUN: 28 mg/dL — ABNORMAL HIGH (ref 8–23)
CO2: 26 mmol/L (ref 22–32)
Calcium: 9.5 mg/dL (ref 8.9–10.3)
Chloride: 103 mmol/L (ref 98–111)
Creatinine: 1.43 mg/dL — ABNORMAL HIGH (ref 0.61–1.24)
GFR, Estimated: 52 mL/min — ABNORMAL LOW (ref >60)
Glucose, Bld: 113 mg/dL — ABNORMAL HIGH (ref 70–99)
Potassium: 4.9 mmol/L (ref 3.5–5.1)
Sodium: 138 mmol/L (ref 135–145)
Total Bilirubin: 0.6 mg/dL (ref 0.0–1.2)
Total Protein: 6.7 g/dL (ref 6.5–8.1)

## 2023-07-04 LAB — FERRITIN: Ferritin: 19 ng/mL — ABNORMAL LOW (ref 24–336)

## 2023-07-04 LAB — IRON AND IRON BINDING CAPACITY (CC-WL,HP ONLY)
Iron: 32 ug/dL — ABNORMAL LOW (ref 45–182)
Saturation Ratios: 7 % — ABNORMAL LOW (ref 17.9–39.5)
TIBC: 437 ug/dL (ref 250–450)
UIBC: 405 ug/dL — ABNORMAL HIGH (ref 117–376)

## 2023-07-04 LAB — CBC WITH DIFFERENTIAL (CANCER CENTER ONLY)
Abs Immature Granulocytes: 0.13 10*3/uL — ABNORMAL HIGH (ref 0.00–0.07)
Basophils Absolute: 0.2 10*3/uL — ABNORMAL HIGH (ref 0.0–0.1)
Basophils Relative: 2 %
Eosinophils Absolute: 0.1 10*3/uL (ref 0.0–0.5)
Eosinophils Relative: 1 %
HCT: 42.6 % (ref 39.0–52.0)
Hemoglobin: 13.2 g/dL (ref 13.0–17.0)
Immature Granulocytes: 1 %
Lymphocytes Relative: 8 %
Lymphs Abs: 0.9 10*3/uL (ref 0.7–4.0)
MCH: 32 pg (ref 26.0–34.0)
MCHC: 31 g/dL (ref 30.0–36.0)
MCV: 103.4 fL — ABNORMAL HIGH (ref 80.0–100.0)
Monocytes Absolute: 1.1 10*3/uL — ABNORMAL HIGH (ref 0.1–1.0)
Monocytes Relative: 10 %
Neutro Abs: 8.9 10*3/uL — ABNORMAL HIGH (ref 1.7–7.7)
Neutrophils Relative %: 78 %
Platelet Count: 963 10*3/uL (ref 150–400)
RBC: 4.12 MIL/uL — ABNORMAL LOW (ref 4.22–5.81)
RDW: 16.4 % — ABNORMAL HIGH (ref 11.5–15.5)
WBC Count: 11.2 10*3/uL — ABNORMAL HIGH (ref 4.0–10.5)
nRBC: 0 % (ref 0.0–0.2)

## 2023-07-04 LAB — SAVE SMEAR(SSMR), FOR PROVIDER SLIDE REVIEW

## 2023-07-04 LAB — LACTATE DEHYDROGENASE: LDH: 441 U/L — ABNORMAL HIGH (ref 98–192)

## 2023-07-04 NOTE — Telephone Encounter (Signed)
 Critical result received from the lab, Plt = [phone]. Dr. Myna Hidalgo informed. No new orders received.

## 2023-07-04 NOTE — Progress Notes (Signed)
 Hematology and Oncology Follow Up Visit  William Villanueva [id number] 12/26/1950 73 y.o. 07/04/2023   Principle Diagnosis:  Polycythemia vera-JAK2 positive Heart block-Mobitz II  Current Therapy:   Hydrea 500 mg p.o.TID -dose changed on 07/04/2023 Aspirin 81 mg by mouth daily Phlebotomy to maintain hematocrit below 45%     Interim History:  Mr.  William Villanueva is back for followup.  Unfortunately, this problem right now is his heart.  He has severe cardiomyopathy.  His ejection  fraction is less than 20%.  He will see his cardiologist to discuss with possible recommendations are being made.  Unfortunately, his platelet count is now over 900,000.  This is quite unusual for him.  Have to increase the dose of Hydrea to 500 mg p.o. 3 times daily.  I think that we may have to think about using Jakafi for one of the other Jak inhibitors.  However, I do not know what kind of cardiac defects these have.  Short of breath quite easily.  He is eating okay.  He still trying to watch his weight.  He has had no nausea or vomiting.  He has had no change in bowel bladder habits.  There has been no bleeding.  There has a lot of ecchymoses.  I suspect this probably from the aspirin that he takes.  Overall, I would say his performance status is probably ECOG 2.   Medications:  Current Outpatient Medications:    clotrimazole (LOTRIMIN) 1 % cream, Apply 1 Application topically., Disp: , Rfl:    acetaminophen (TYLENOL) 500 MG tablet, Take 1,000 mg by mouth every 6 (six) hours as needed for headache., Disp: , Rfl:    allopurinol (ZYLOPRIM) 300 MG tablet, Take 1 tablet (300 mg total) by mouth daily., Disp: 90 tablet, Rfl: 1   Ascorbic Acid (VITAMIN C PO), Take by mouth., Disp: , Rfl:    budesonide (PULMICORT) 0.5 MG/2ML nebulizer solution, Take 2 mLs (0.5 mg total) by nebulization 2 (two) times daily. (RINSE MOUTH AFTER USE), Disp: 360 mL, Rfl: 11   cholestyramine (QUESTRAN) 4 GM/DOSE powder, Take 4 g by  mouth daily as needed (diarrhea)., Disp: , Rfl:    Cyanocobalamin (B-12 PO), Take 1 tablet by mouth daily., Disp: , Rfl:    diclofenac Sodium (VOLTAREN) 1 % GEL, Apply 2 g topically daily as needed (pain)., Disp: , Rfl:    dicyclomine (BENTYL) 20 MG tablet, Take 20 mg by mouth 2 (two) times daily., Disp: , Rfl:    empagliflozin (JARDIANCE) 10 MG TABS tablet, Take 1 tablet (10 mg total) by mouth daily., Disp: 90 tablet, Rfl: 3   hydroxyurea (HYDREA) 500 MG capsule, TAKE TWO CAPSULES BY MOUTH DAILY, Disp: 160 capsule, Rfl: 4   insulin degludec (TRESIBA FLEXTOUCH) 100 UNIT/ML SOPN FlexTouch Pen, Inject 16 Units into the skin daily after breakfast., Disp: , Rfl:    losartan (COZAAR) 25 MG tablet, TAKE 1/2 TABLET BY MOUTH DAILY, Disp: 40 tablet, Rfl: 3   magnesium gluconate (MAGONATE) 500 MG tablet, Take 500 mg by mouth in the morning and at bedtime., Disp: , Rfl:    metoprolol succinate (TOPROL XL) 25 MG 24 hr tablet, Take 0.5 tablets (12.5 mg total) by mouth daily., Disp: 45 tablet, Rfl: 3   omeprazole (PRILOSEC) 20 MG capsule, Take 20 mg by mouth in the morning and at bedtime., Disp: , Rfl:    oxybutynin (DITROPAN XL) 15 MG 24 hr tablet, Take 15 mg by mouth in the morning., Disp: , Rfl:  sodium bicarbonate 650 MG tablet, Take 1 tablet (650 mg total) by mouth 2 (two) times daily., Disp: 60 tablet, Rfl: 0   spironolactone (ALDACTONE) 25 MG tablet, Take 0.5 tablets (12.5 mg total) by mouth daily., Disp: 45 tablet, Rfl: 3   tamsulosin (FLOMAX) 0.4 MG CAPS capsule, Take 0.4 mg by mouth daily., Disp: , Rfl:    torsemide (DEMADEX) 20 MG tablet, Take 2 tablets (40 mg total) by mouth 2 (two) times daily., Disp: 120 tablet, Rfl: 3   Turmeric (QC TUMERIC COMPLEX) 500 MG CAPS, Take 1 tablet by mouth daily., Disp: , Rfl:   Allergies:  Allergies  Allergen Reactions   Advil [Ibuprofen] Itching   Keflex [Cephalexin] Diarrhea and Other (See Comments)    Caused C-diff, also    Wellbutrin [Bupropion] Hives and  Other (See Comments)   Prednisone Itching and Other (See Comments)    Abdominal pain, also    Temazepam Other (See Comments)    Dizziness    Trazodone And Nefazodone Other (See Comments)    Dizziness   Sacubitril-Valsartan Other (See Comments)    Unknown   Prozac [Fluoxetine Hcl] Itching    Past Medical History, Surgical history, Social history, and Family History were reviewed and updated.  Review of Systems: Review of Systems  Constitutional: Negative.   HENT: Negative.    Eyes: Negative.   Respiratory:  Positive for shortness of breath.   Cardiovascular:  Positive for chest pain.  Gastrointestinal: Negative.   Genitourinary: Negative.   Musculoskeletal:  Positive for back pain.  Skin: Negative.   Neurological: Negative.   Endo/Heme/Allergies: Negative.   Psychiatric/Behavioral: Negative.      Physical Exam: Vital signs temperature 97.8.  Pulse 86.  Blood pressure 103/51.  Weight is 227 pounds.  Good  Physical Exam Vitals reviewed.  HENT:     Head: Normocephalic and atraumatic.  Eyes:     Pupils: Pupils are equal, round, and reactive to light.  Cardiovascular:     Rate and Rhythm: Normal rate and regular rhythm.     Heart sounds: Normal heart sounds.  Pulmonary:     Effort: Pulmonary effort is normal.     Breath sounds: Normal breath sounds.  Abdominal:     General: Bowel sounds are normal.     Palpations: Abdomen is soft.  Musculoskeletal:        General: No tenderness or deformity. Normal range of motion.     Cervical back: Normal range of motion.  Lymphadenopathy:     Cervical: No cervical adenopathy.  Skin:    General: Skin is warm and dry.     Findings: No erythema or rash.  Neurological:     Mental Status: He is alert and oriented to person, place, and time.  Psychiatric:        Behavior: Behavior normal.        Thought Content: Thought content normal.        Judgment: Judgment normal.      Lab Results  Component Value Date   WBC 11.2 (H)  07/04/2023   HGB 13.2 07/04/2023   HCT 42.6 07/04/2023   MCV 103.4 (H) 07/04/2023   PLT 963 (HH) 07/04/2023     Chemistry      Component Value Date/Time   NA 137 06/19/2023 1225   NA 140 03/02/2023 1304   NA 141 04/10/2017 0923   NA 140 04/05/2016 0739   K 4.1 06/19/2023 1225   K 3.5 04/10/2017 0923   K 4.2 04/05/2016  0739   CL 104 06/19/2023 1225   CL 108 04/10/2017 0923   CO2 24 06/19/2023 1225   CO2 24 04/10/2017 0923   CO2 20 (L) 04/05/2016 0739   BUN 25 (H) 06/19/2023 1225   BUN 29 (H) 03/02/2023 1304   BUN 12 04/10/2017 0923   BUN 14.7 04/05/2016 0739   CREATININE 1.38 (H) 06/19/2023 1225   CREATININE 1.28 (H) 05/11/2023 0856   CREATININE 1.1 04/10/2017 0923   CREATININE 1.1 04/05/2016 0739      Component Value Date/Time   CALCIUM 8.5 (L) 06/19/2023 1225   CALCIUM 8.4 04/10/2017 0923   CALCIUM 9.0 04/05/2016 0739   ALKPHOS 62 06/19/2023 1225   ALKPHOS 77 04/10/2017 0923   ALKPHOS 91 04/05/2016 0739   AST 12 (L) 06/19/2023 1225   AST 11 (L) 05/11/2023 0856   AST 34 04/05/2016 0739   ALT 10 06/19/2023 1225   ALT 14 05/11/2023 0856   ALT 32 04/10/2017 0923   ALT 40 04/05/2016 0739   BILITOT 1.0 06/19/2023 1225   BILITOT 0.7 05/11/2023 0856   BILITOT 0.48 04/05/2016 0739      Impression and Plan: Mr. Moncus is 73 year old gentleman with polycythemia vera.  Obviously, the bigger issue now is his heart.  I would be very surprised if they would consider him for a cardiac transplant given his polycythemia.  I know that he may talk about a LVAD for him.  This certainly could be an option.  Again we will get have to increase the Hydrea to 500 mg p.o. 3 times daily.  We will have to get her back into see [place] in about 3 or 4 weeks.  Again had to watch him closely now.  He still does not need to be phlebotomized.    Josph Macho, MD 2/26/20259:29 AM

## 2023-07-04 NOTE — Progress Notes (Signed)
 Office Visit Note  Patient: William Villanueva             Date of Birth: 1950-10-20           MRN: [id number]             PCP: Tally Joe, MD Referring: Tally Joe, MD Visit Date: 07/18/2023   Subjective:  Follow-up (Patient states last week he started iron. Patient states after that, every joint is hurting. Patient states he is having cramps. Patient is now seeing a cardiologist for his heart problems. )   History of Present Illness:  Discussed the use of AI scribe software for clinical note transcription with the patient, who gave verbal consent to proceed.  History of Present Illness   William Villanueva is a 73 y.o. male here for follow up for gout and osteoarthritis on allopurinol 300 mg daily.   He currently complains of increased joint pain and hand cramping that seems to be worse following an iron infusion.  He has been experiencing joint pain since receiving an iron infusion one week ago. He is scheduled for a second infusion tomorrow and is concerned about the joint pain being related to the infusion. This is his first experience with iron infusions.  He describes cramping in his hands, which occurs intermittently during activities such as playing a game or sitting. The cramping is painful, and he uses a cream and a brace for relief. Both hands cramp, but the right hand is more affected. He recalls receiving a shot in the past that did not alleviate the pain, as it was administered in a different area than where the pain is located. He has a history of osteoarthritis, particularly affecting his thumb, and notes muscle loss in his hand.  He has a history of heart issues, with an ejection fraction of 21%. He has a pacemaker with a defibrillator due to previous episodes of his heart stopping. He mentions bruising easily and takes vitamin C to help with this.  Previous HPI 04/18/2023 William Villanueva is a 73 y.o. male here for follow up for gout and osteoarthritis on allopurinol  300 mg daily.  He presents with complaints of right shoulder pain, hand cramps, and a persistently swollen and discolored left foot. The shoulder pain, which has been worsening over the past six to seven months, is severe enough to disrupt sleep. The patient reports that a previous injection into the shoulder helped for a while but symptoms are severe again. Pain often radiating into his arm after certain movements.   In addition to shoulder pain, the patient has been experiencing severe hand cramps in both hands for the past couple of months. These cramps occur spontaneously, without any identifiable trigger.   The patient's left foot has been persistently swollen and discolored, turning a reddish-blue hue. This has been a constant issue, with the foot remaining swollen and discolored most of the time. The patient has seen two different foot doctors for this issue, both of whom recommended shoe inserts, but the patient remains skeptical about the effectiveness of this solution.   Right thumb MCP steroid injection at last visit did not help symptoms significantly.     Previous HPI 01/25/2023 William Villanueva is a 73 y.o. male here for follow up for gout and osteoarthritis on allopurinol 300 mg daily.  He has numerous medical events since her last visit with hospitalization for heart failure decompensation.  Notes that podiatry evaluation for the persistent pain swelling discoloration in his foot  despite well-controlled uric acid level.  Appears symptoms were attributed to peripheral neuropathy and edema primarily.  The benefit from steroid injections earlier this year is mostly worn off he is having a lot of pain at the base of his right thumb is the worst area.  Does not see a lot of visible swelling he has extensive easy bruising on both forearms.  Interested in repeat steroid injection today.  He also has a follow-up appointment with his back specialist coming up anticipates needing repeat  injections.   Previous HPI 08/28/22 William Villanueva is a 73 y.o. male here for follow up for rheumatoid arthritis and gout on allopurinol 200 mg daily.  He felt a lot of benefit with the joint steroid injections from previous visit his right hand and left shoulder.  The shoulder remains largely improved although he still has mild symptoms there.  Right hand is giving him more trouble again especially at the thumb he is using a brace that is partially helpful.  No new definite gout flareups of inflammation.     Previous HPI 04/07/22 William Villanueva is a 73 y.o. male here for follow up for rheumatoid arthritis and gout currently on allopurinol increased to 200 mg daily after our initial visit. He continues having joint pain and stiffness at multiple areas. Most problematic are his right thumb and his left shoulder. Swelling stays mostly at his feet and ankles. He got sick with RSV and delayed his planned pacemaker battery replacement so that is coming up. Still has a bit of increased cough mostly nonproductive.    Previous HPI 02/23/22 William Villanueva is a 73 y.o. male here for rheumatoid arthritis. He was previously seeing [facility] rheumatology on treatment with methotrexate and had been on allopurinol and uloric for gout. He has history of carpal tunnel syndrome with surgical treatment for the left wrist.  Original diagnosis was RA he had joint pain in multiple areas with abnormal laboratory test.  He was started on injectable methotrexate but switched to oral methotrexate due to pain and difficulty with the injections.  He stopped following up with her clinic due to dissatisfaction including never seeing his MD and  not addressing any complaints not directly attributable to his inflammatory arthritis.  As result he has been off any disease specific medication for over a year does feel he has increased in joint pains off of treatment.  He had also been off of the Uloric they were prescribing but was  restarted on allopurinol in the past few months with his PCP office.  He reports 1 flare of gout in his foot within the past month but before that had been free of any attacks for several months duration.  He still has residual swelling and erythema in the left foot but was told this may be coming from his heart failure edema as well.   Generally has daily joint pain in multiple areas particular including bilateral hands and in his back.  He gets foot and ankle pain most severe during gout flares has mild symptoms otherwise.  More recently also developed increased left shoulder pain for several days duration for which he went to urgent care for evaluation of this but ended up going to the hospital due to hypoxia and increased cough so the original problem was never addressed.  Treatment course at the hospital for congestive heart failure exacerbation and improved after several days.  He still had increased cough producing significant discolored sputum and received 1 dose of IV ceftriaxone.  X-ray of the shoulder was obtained demonstrated mild glenohumeral joint osteoarthritis and moderate AC joint osteoarthritis.   Labs reviewed 10/2021 Uric acid 9.7 eGFR 59   Review of Systems  Constitutional:  Positive for fatigue.  HENT:  Positive for mouth dryness. Negative for mouth sores.   Eyes:  Positive for dryness.  Respiratory:  Positive for shortness of breath.   Cardiovascular:  Positive for chest pain. Negative for palpitations.  Gastrointestinal:  Positive for diarrhea. Negative for blood in stool and constipation.  Endocrine: Positive for increased urination.  Genitourinary:  Negative for involuntary urination.  Musculoskeletal:  Positive for joint pain, gait problem, joint pain, joint swelling, myalgias, muscle weakness, morning stiffness, muscle tenderness and myalgias.  Skin:  Positive for color change. Negative for rash, hair loss and sensitivity to sunlight.  Allergic/Immunologic: Positive  for susceptible to infections.  Neurological:  Positive for dizziness and headaches.  Hematological:  Negative for swollen glands.  Psychiatric/Behavioral:  Positive for depressed mood and sleep disturbance. The patient is nervous/anxious.     PMFS History:  Patient Active Problem List   Diagnosis Date Noted   Osteoarthritis of carpometacarpal [facility]) joint of right thumb 01/25/2023   Acute on chronic combined systolic and diastolic CHF (congestive heart failure) (HCC) 01/11/2023   Acute on chronic combined systolic (congestive) and diastolic (congestive) heart failure (HCC) 01/10/2023   CHF (congestive heart failure) (HCC) 01/08/2023   Acute on chronic systolic CHF (congestive heart failure) (HCC) 12/21/2022   CKD stage 3a, GFR 45-59 ml/min (HCC) 12/21/2022   Chronic respiratory failure with hypoxia (HCC) - prn 2 L/min home O2 12/21/2022   Bilateral pleural effusion 12/21/2022   Increased frequency of urination 10/09/2022   Nevus of scalp 10/09/2022   Rheumatoid arthritis (HCC) 02/23/2022   High risk medication use 02/23/2022   COPD (chronic obstructive pulmonary disease) (HCC) 02/10/2022   Class 2 obesity 02/08/2022   DM2 (diabetes mellitus, type 2) (HCC) 02/04/2022   Nocturnal hypoxemia 12/30/2021   Non-ischemic cardiomyopathy (HCC) 07/29/2020   Postural dizziness with presyncope 02/18/2020   Ulcerative colitis, acute, unspecified complication (HCC) 02/18/2020   Thrombocytosis 02/12/2020   Sprain of left wrist 08/12/2019   Depression 04/05/2016   Kidney stones 04/05/2016   Osteoarthritis 04/05/2016   OSA (obstructive sleep apnea) 04/05/2016   Bilateral carpal tunnel syndrome 12/02/2015   Chronic combined systolic and diastolic congestive heart failure (HCC) 04/13/2015   Orthostatic hypotension 01/19/2015   Obesity 11/26/2014   Enlarged prostate without lower urinary tract symptoms (luts) 10/29/2014   Essential hypertension 10/29/2014   GERD (gastroesophageal reflux disease)  10/29/2014   Panic attack 10/29/2014   Changing skin lesion 10/16/2014   Renal cyst 09/02/2014   Dyspnea on exertion 06/21/2014   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   Palpitations 06/11/2014   Myofascial pain 05/28/2014   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   Other cardiomyopathies (HCC) 04/29/2014   Chronic pain 02/12/2014   Lumbar radiculopathy 02/09/2014   Thrombocythemia 12/01/2013   Muscular wasting and disuse atrophy 08/15/2013   Degenerative lumbar spinal stenosis 08/06/2013   Lumbar and sacral osteoarthritis 05/19/2013   Congestive dilated cardiomyopathy (HCC) 01/11/2012   Back pain    Hemochromatosis    SOB (shortness of breath)    Gout    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 Jude Allure Quadra pulse generator [id number], model PM 3242   Back pain    Cardiomyopathy (HCC)  Nonischemic 45%.    CHF (congestive heart failure) (HCC)    Diabetes mellitus without complication (HCC)    Fracture of lumbar vertebra without spinal cord injury, sequela 10/09/2022   Gout    Hemochromatosis    Hypertension    Hypospadias Jan 23, 1951   born with   Nephrolithiasis    Pacemaker lead malfunction-elevated threshold RV lead 07/31/2014   Polycythemia vera(238.4)     Family History  Problem Relation Age of Onset   Stroke Mother    Other Father        Deceased, car fell on him   Diabetes Sister    Hypertension Sister    Heart disease Maternal Grandmother        Pacemaker, MI   Past Surgical History:  Procedure Laterality Date   ANKLE SURGERY     BI-VENTRICULAR PACEMAKER INSERTION N/A 04/29/2014   Procedure: BI-VENTRICULAR PACEMAKER INSERTION (CRT-P);  Surgeon: Duke Salvia, MD; Laterality: Left  St. Jude Allure Quadra pulse generator [phone], model [place] 2952   CARDIAC SURGERY     COLONOSCOPY N/A 02/15/2020   Procedure: COLONOSCOPY;  Surgeon: Kerin Salen, MD;  Location: [facility] ENDOSCOPY;  Service:  Gastroenterology;  Laterality: N/A;   ESOPHAGOGASTRODUODENOSCOPY (EGD) WITH PROPOFOL N/A 02/15/2020   Procedure: ESOPHAGOGASTRODUODENOSCOPY (EGD) WITH PROPOFOL;  Surgeon: Kerin Salen, MD;  Location: [facility] ENDOSCOPY;  Service: Gastroenterology;  Laterality: N/A;   LEAD INSERTION N/A 04/28/2022   Procedure: LEAD INSERTION;  Surgeon: Duke Salvia, MD;  Location: [facility] INVASIVE CV LAB;  Service: Cardiovascular;  Laterality: N/A;   PILONIDAL CYST EXCISION     POSTERIOR LAMINECTOMY / DECOMPRESSION LUMBAR SPINE     PPM GENERATOR CHANGEOUT N/A 04/28/2022   Procedure: PPM GENERATOR CHANGEOUT;  Surgeon: Duke Salvia, MD;  Location: [facility] INVASIVE CV LAB;  Service: Cardiovascular;  Laterality: N/A;   RIGHT/LEFT HEART CATH AND CORONARY ANGIOGRAPHY N/A 01/10/2023   Procedure: RIGHT/LEFT HEART CATH AND CORONARY ANGIOGRAPHY;  Surgeon: Marykay Lex, MD;  Location: [facility] INVASIVE CV LAB;  Service: Cardiovascular;  Laterality: N/A;   TONSILLECTOMY     Social History   Social History Narrative   Lives at home with wife in a one story home.  Has no children.  Does not work.  Getting workman's comp.  Education: 4 years trade school.    Immunization History  Administered Date(s) Administered   DTaP 01/26/2016   Fluad Quad(high Dose 65+) 03/07/2021, 03/02/2022   Influenza Split 01/21/2015   Influenza, High Dose Seasonal PF 02/23/2017, 03/22/2018, 01/10/2019, 03/01/2020   Influenza,inj,Quad PF,6+ Mos 01/21/2015, 01/27/2016, 02/05/2017   Influenza-Unspecified 01/26/2016   PFIZER Comirnaty(Gray Top)Covid-19 Tri-Sucrose Vaccine 09/10/2020   PFIZER(Purple Top)SARS-COV-2 Vaccination 06/21/2019, 07/13/2019, 04/16/2020, 09/10/2020   Pfizer Covid-19 Vaccine Bivalent Booster 5yrs & up 03/07/2021   Pfizer(Comirnaty)Fall Seasonal Vaccine 12 years and older 03/02/2022   Pneumococcal Conjugate-13 01/27/2016, 01/09/2018   Pneumococcal Polysaccharide-23 01/21/2015   Pneumococcal-Unspecified 01/26/2016   Tdap 01/26/2011,  01/27/2016, 08/09/2019   Zoster, Live 08/29/2019     Objective: Vital Signs: BP 113/64 (BP Location: Left Arm, Patient Position: Sitting, Cuff Size: Large)   Pulse 86   Resp 20   Ht 5\' 10"  (1.778 m)   Wt 234 lb (106.1 kg)   BMI 33.58 kg/m    Physical Exam Eyes:     Conjunctiva/sclera: Conjunctivae normal.  Cardiovascular:     Rate and Rhythm: Normal rate and regular rhythm.  Pulmonary:     Effort: Pulmonary effort is normal.     Breath sounds: Normal breath sounds.  Musculoskeletal:     Right lower leg: No edema.     Left lower leg: No edema.  Lymphadenopathy:     Cervical: No cervical adenopathy.  Skin:    General: Skin is warm and dry.     Findings: Bruising present.  Neurological:     Mental Status: He is alert.  Psychiatric:        Mood and Affect: Mood normal.      Musculoskeletal Exam:  Neck full ROM no tenderness Shoulders restricted abduction and external rotation, right shoulder tenderness to pressure worst around bicipital groove, no palpable swelling Elbows full ROM no tenderness or swelling Wrists full ROM no tenderness or swelling Right thenar eminence and interosseus muscle atrophy, 1st CMC squaring and tenderness to pressure, heberdon;s nodes Knees patellofemoral crepitus, no tenderness to pressure, large anterior bony nodule on left patella  Investigation: No additional findings.  Imaging: MYOCARDIAL AMYLOID IMAGING PLANAR AND SPECT Result Date: 07/06/2023   Myocardial uptake was negative for radiotracer uptake. The visual grade of myocardial uptake relative to the ribs was Grade 0 (No myocardial uptake and normal bone uptake).   Findings are not suggestive (Grade 0) of cardiac ATTR amyloidosis.   Prior study not available for comparison.    Recent Labs: Lab Results  Component Value Date   WBC 9.6 07/18/2023   HGB 13.3 07/18/2023   PLT 800 (H) 07/18/2023   NA 138 07/04/2023   K 4.9 07/04/2023   CL 103 07/04/2023   CO2 26 07/04/2023    GLUCOSE 113 (H) 07/04/2023   BUN 28 (H) 07/04/2023   CREATININE 1.43 (H) 07/04/2023   BILITOT 0.6 07/04/2023   ALKPHOS 61 07/04/2023   AST 15 07/04/2023   ALT 12 07/04/2023   PROT 6.7 07/04/2023   ALBUMIN 4.2 07/04/2023   CALCIUM 9.5 07/04/2023   GFRAA 83 05/10/2020    Speciality Comments: No specialty comments available.  Procedures:  Large Joint Inj: R subacromial bursa on 07/18/2023 10:50 AM Indications: pain Details: 27 G 1.5 in needle, lateral approach Medications: 2 mL lidocaine 1 %; 40 mg triamcinolone acetonide 40 MG/ML Outcome: tolerated well, no immediate complications Procedure, treatment alternatives, risks and benefits explained, specific risks discussed. Consent was given by the patient. Immediately prior to procedure a time out was called to verify the correct patient, procedure, equipment, support staff and site/side marked as required. Patient was prepped and draped in the usual sterile fashion.     Allergies: Advil [ibuprofen], Keflex [cephalexin], Wellbutrin [bupropion], Prednisone, Temazepam, Trazodone and nefazodone, Sacubitril-valsartan, and Prozac [fluoxetine hcl]   Assessment / Plan:     Visit Diagnoses: Idiopathic chronic gout of multiple sites without tophus - 10/09/2022 Uric Acid 4.9 Gout appears well-controlled with no new acute flares.  On allopurinol 300 mg daily without new changes.  No significant change in cardiology medications but he has upcoming follow-up due to worsening congestive heart failure with reduced ejection fraction. -Continue allopurinol 300 mg daily  Right shoulder pain, unspecified chronicity - S/P Large Joint Inj: R subacromial bursa on 04/18/2023 - Plan: Large Joint Inj: R subacromial bursa Persistent right shoulder pain, particularly when sleeping on the right side. Pain localized at the bicipital groove, likely due to impingement. -Repeat right shoulder subacromial bursa injection itoday  Hand cramps Intermittent hand cramping  and pain, particularly in the right hand, associated with osteoarthritis and muscle atrophy. Likely due to a combination of arthritis and muscle atrophy, leading to overuse-type cramps. - Printed hand exercises. - Consider referral to  occupational therapy for hand evaluation. - Offer repeat injection in the bicipital groove if interested.  Joint pain post-iron infusion Joint pain following an iron infusion likely related to an inflammatory response. Infusion reactions are common and usually self-limited.  No red flags for true anaphylaxis type response, no appreciable increased swelling or rashes on exam.  Heart failure with reduced ejection fraction Heart failure with an ejection fraction of 21%. Has a pacemaker with a defibrillator due to previous cardiac arrest. Under cardiologist care, considering pump or transplant. Increased arrhythmia risk with ejection fraction below 30%.   Orders: Orders Placed This Encounter  Procedures   Large Joint Inj: R subacromial bursa   No orders of the defined types were placed in this encounter.    Follow-Up Instructions: Return in about 3 months (around 10/18/2023), or if symptoms worsen or fail to improve, for OA/gout on allopurinol/inj.   Fuller Plan, MD  Note - This record has been created using [employer].  Chart creation errors have been sought, but may not always  have been located. Such creation errors do not reflect on  the standard of medical care.

## 2023-07-05 ENCOUNTER — Ambulatory Visit (HOSPITAL_COMMUNITY): Payer: Medicare Other | Attending: Cardiology

## 2023-07-05 DIAGNOSIS — I5022 Chronic systolic (congestive) heart failure: Secondary | ICD-10-CM | POA: Insufficient documentation

## 2023-07-05 MED ORDER — TECHNETIUM TC 99M PYROPHOSPHATE
21.1000 | Freq: Once | INTRAVENOUS | Status: AC
  Administered 2023-07-05: 21.1 via INTRAVENOUS

## 2023-07-06 LAB — MYOCARDIAL AMYLOID PLANAR & SPECT: H/CL Ratio: 1.3

## 2023-07-11 ENCOUNTER — Inpatient Hospital Stay: Payer: Medicare Other | Attending: Hematology & Oncology

## 2023-07-11 VITALS — BP 114/60 | HR 80 | Temp 97.6°F | Resp 18

## 2023-07-11 DIAGNOSIS — Z7982 Long term (current) use of aspirin: Secondary | ICD-10-CM | POA: Diagnosis not present

## 2023-07-11 DIAGNOSIS — D45 Polycythemia vera: Secondary | ICD-10-CM | POA: Diagnosis not present

## 2023-07-11 DIAGNOSIS — Z79899 Other long term (current) drug therapy: Secondary | ICD-10-CM | POA: Insufficient documentation

## 2023-07-11 DIAGNOSIS — E611 Iron deficiency: Secondary | ICD-10-CM | POA: Diagnosis not present

## 2023-07-11 MED ORDER — SODIUM CHLORIDE 0.9 % IV SOLN
510.0000 mg | Freq: Once | INTRAVENOUS | Status: AC
  Administered 2023-07-11: 510 mg via INTRAVENOUS
  Filled 2023-07-11: qty 17

## 2023-07-11 MED ORDER — SODIUM CHLORIDE 0.9 % IV SOLN
INTRAVENOUS | Status: DC
Start: 2023-07-11 — End: 2023-07-11

## 2023-07-11 NOTE — Patient Instructions (Signed)

## 2023-07-16 ENCOUNTER — Ambulatory Visit: Payer: Medicare Other | Attending: Internal Medicine

## 2023-07-16 DIAGNOSIS — I5022 Chronic systolic (congestive) heart failure: Secondary | ICD-10-CM

## 2023-07-16 DIAGNOSIS — Z95810 Presence of automatic (implantable) cardiac defibrillator: Secondary | ICD-10-CM

## 2023-07-17 NOTE — Progress Notes (Signed)
 EPIC Encounter for ICM Monitoring  Patient Name: William Villanueva is a 73 y.o. male Date: 07/17/2023 Primary Care Physican: Tally Joe, MD Primary Cardiologist: Crenshaw/Stoner (1st visit 2/11) Electrophysiologist: Joycelyn Schmid Pacing: 96%            07/17/2022 Weight:  217 lbs 09/20/2022 Weight: 227 lbs 10/24/2022 Weight: 228 lbs 01/04/2023 Weight: 234 lbs 06/12/2023 Weight: 227 lbs 06/18/2023 Weight: 230 lbs 07/03/2023 Weight: 227-230 lbs 07/16/2023 Weight: 231 lbs    AT/AF Burden <1%                                                      Spoke with patient and heart failure questions reviewed.  Transmission results reviewed.  Pt reports weight is up by 2 lbs on 3/10 and took extra Torsemide.    Diet:  Does not salt food but not monitoring salt content of foods he eats.   He drinks < 64 oz fluid daily                        CorVue thoracic daily impedance trending slightly below baseline.   Prescribed:   Torsemide 40 mg Take 1 tablet(s) (40 mg total) by mouth twice a day.     Spironolactone 25 mg take 0.5 tablet (12.5 mg total) once a day   Labs: 07/02/2023 Creatinine 1.38, BUN 25, Potassium 4.1, Sodium 137, GFR 54 05/11/2023 Creatinine 1.28, BUN 29, Potassium 4.8, Sodium 141, GFR <60 03/05/2023 Creatinine 1.64, BUN 37, Potassium 4.5, Sodium 138  03/02/2023 Creatinine 1.48, BUN 29, Potassium 5.9, Sodium 140, GFR 50 (potassium addressed in ED) 02/26/2023 Creatinine 1.49, BUN 32, Potassium 5.5, Sodium 140, GFR 50  02/12/2023 Creatinine 1.64, BUN 47, Potassium 5.1, Sodium 137, GFR 44 A complete set of results can be found in Results Review.   Recommendations:  Pt took extra fluid pill today due to weight increase.   No changes and encouraged to call if experiencing any fluid symptoms.   Follow-up plan: ICM clinic phone appointment on 08/20/2023.   91 day device clinic remote transmission 07/31/2023.     EP/Cardiology Office Visits: 07/18/2023 with Dr Elwyn Lade, HF clinic.   Recall 04/11/2024  with Dr Graciela Husbands.   Copy of ICM check sent to Dr. Graciela Husbands.   3 month ICM trend: 07/16/2023.    12-14 Month ICM trend:     Karie Soda, RN 07/17/2023 3:47 PM

## 2023-07-18 ENCOUNTER — Encounter: Payer: Self-pay | Admitting: Internal Medicine

## 2023-07-18 ENCOUNTER — Ambulatory Visit (HOSPITAL_COMMUNITY)
Admission: RE | Admit: 2023-07-18 | Discharge: 2023-07-18 | Disposition: A | Discharge status: Home / Self Care | Payer: PPO | Source: Ambulatory Visit | Attending: Cardiology | Admitting: Cardiology

## 2023-07-18 ENCOUNTER — Encounter (HOSPITAL_COMMUNITY): Payer: Self-pay | Admitting: Cardiology

## 2023-07-18 ENCOUNTER — Other Ambulatory Visit (HOSPITAL_COMMUNITY): Payer: Self-pay

## 2023-07-18 ENCOUNTER — Ambulatory Visit: Payer: PPO | Attending: Internal Medicine | Admitting: Internal Medicine

## 2023-07-18 VITALS — BP 113/64 | HR 86 | Resp 20 | Ht 70.0 in | Wt 234.0 lb

## 2023-07-18 VITALS — BP 104/60 | HR 79

## 2023-07-18 DIAGNOSIS — N1831 Chronic kidney disease, stage 3a: Secondary | ICD-10-CM | POA: Insufficient documentation

## 2023-07-18 DIAGNOSIS — I130 Hypertensive heart and chronic kidney disease with heart failure and stage 1 through stage 4 chronic kidney disease, or unspecified chronic kidney disease: Secondary | ICD-10-CM | POA: Diagnosis not present

## 2023-07-18 DIAGNOSIS — E785 Hyperlipidemia, unspecified: Secondary | ICD-10-CM | POA: Insufficient documentation

## 2023-07-18 DIAGNOSIS — Z95810 Presence of automatic (implantable) cardiac defibrillator: Secondary | ICD-10-CM | POA: Diagnosis not present

## 2023-07-18 DIAGNOSIS — G4733 Obstructive sleep apnea (adult) (pediatric): Secondary | ICD-10-CM | POA: Insufficient documentation

## 2023-07-18 DIAGNOSIS — I4439 Other atrioventricular block: Secondary | ICD-10-CM | POA: Diagnosis not present

## 2023-07-18 DIAGNOSIS — M1811 Unilateral primary osteoarthritis of first carpometacarpal joint, right hand: Secondary | ICD-10-CM | POA: Diagnosis not present

## 2023-07-18 DIAGNOSIS — I428 Other cardiomyopathies: Secondary | ICD-10-CM | POA: Diagnosis not present

## 2023-07-18 DIAGNOSIS — I5042 Chronic combined systolic (congestive) and diastolic (congestive) heart failure: Secondary | ICD-10-CM

## 2023-07-18 DIAGNOSIS — Z5181 Encounter for therapeutic drug level monitoring: Secondary | ICD-10-CM

## 2023-07-18 DIAGNOSIS — E1122 Type 2 diabetes mellitus with diabetic chronic kidney disease: Secondary | ICD-10-CM | POA: Insufficient documentation

## 2023-07-18 DIAGNOSIS — Z794 Long term (current) use of insulin: Secondary | ICD-10-CM | POA: Diagnosis not present

## 2023-07-18 DIAGNOSIS — M7989 Other specified soft tissue disorders: Secondary | ICD-10-CM

## 2023-07-18 DIAGNOSIS — R252 Cramp and spasm: Secondary | ICD-10-CM

## 2023-07-18 DIAGNOSIS — M1A09X0 Idiopathic chronic gout, multiple sites, without tophus (tophi): Secondary | ICD-10-CM

## 2023-07-18 DIAGNOSIS — M25511 Pain in right shoulder: Secondary | ICD-10-CM

## 2023-07-18 DIAGNOSIS — I5022 Chronic systolic (congestive) heart failure: Secondary | ICD-10-CM | POA: Diagnosis not present

## 2023-07-18 LAB — BASIC METABOLIC PANEL
Anion gap: 13 (ref 5–15)
BUN: 33 mg/dL — ABNORMAL HIGH (ref 8–23)
CO2: 20 mmol/L — ABNORMAL LOW (ref 22–32)
Calcium: 8.6 mg/dL — ABNORMAL LOW (ref 8.9–10.3)
Chloride: 105 mmol/L (ref 98–111)
Creatinine, Ser: 1.48 mg/dL — ABNORMAL HIGH (ref 0.61–1.24)
GFR, Estimated: 50 mL/min — ABNORMAL LOW (ref >60)
Glucose, Bld: 100 mg/dL — ABNORMAL HIGH (ref 70–99)
Potassium: 5.1 mmol/L (ref 3.5–5.1)
Sodium: 138 mmol/L (ref 135–145)

## 2023-07-18 LAB — CBC
HCT: 43 % (ref 39.0–52.0)
Hemoglobin: 13.3 g/dL (ref 13.0–17.0)
MCH: 31.9 pg (ref 26.0–34.0)
MCHC: 30.9 g/dL (ref 30.0–36.0)
MCV: 103.1 fL — ABNORMAL HIGH (ref 80.0–100.0)
Platelets: 800 10*3/uL — ABNORMAL HIGH (ref 150–400)
RBC: 4.17 MIL/uL — ABNORMAL LOW (ref 4.22–5.81)
RDW: 17.1 % — ABNORMAL HIGH (ref 11.5–15.5)
WBC: 9.6 10*3/uL (ref 4.0–10.5)
nRBC: 0 % (ref 0.0–0.2)

## 2023-07-18 LAB — BRAIN NATRIURETIC PEPTIDE: B Natriuretic Peptide: 612.8 pg/mL — ABNORMAL HIGH (ref 0.0–100.0)

## 2023-07-18 MED ORDER — LIDOCAINE HCL 1 % IJ SOLN
2.0000 mL | INTRAMUSCULAR | Status: AC | PRN
Start: 2023-07-18 — End: 2023-07-18
  Administered 2023-07-18: 2 mL

## 2023-07-18 MED ORDER — TRIAMCINOLONE ACETONIDE 40 MG/ML IJ SUSP
40.0000 mg | INTRAMUSCULAR | Status: AC | PRN
  Administered 2023-07-18: 40 mg via INTRA_ARTICULAR

## 2023-07-18 NOTE — Progress Notes (Addendum)
 ADVANCED HEART FAILURE FOLLOW UP CLINIC NOTE  Referring Physician: Tally Joe, MD  Primary Care: Tally Joe, MD Primary Cardiologist:  HPI: William Villanueva is a 73 y.o. male with a PMH of PMH of nonischemic chronic systolic heart failure, heart block s/p CRT-D, diabetes, CKD, HTN, HLD, OSA who presents for follow up of chronic systolic heart failure.      Evaluated by Dr. Jens Som in August 2013 with complaints of chest pain. Echo showed EF 35 to 40%. Cardiac catheterization September 2013 showed mild nonobstructive CAD with no hemodynamic evidence of restriction. Cardiac MRI September 2013 showed EF 34% with diffuse hypokinesis, no hyperenhancement or scar tissue and no evidence of cardiac hemochromatosis.   Patient was admitted December 2015 with high degree AV block. Subsequently had CRT-P placed. In the last year has had an increase in the number of admissions for volume overload and it appears a precipitous decline in his functional status.     SUBJECTIVE:  Reports that his symptoms are fairly stable since last time.  Has received a dose of IV iron, though it did worsen his arthritis he felt. We discussed the LVAD and is concerned about the driveline limitations.  He is not overly interested in it at this time.  We discussed right heart catheterization for evaluation of cardiac output.  If severely reduced may benefit from LVAD evaluation as well as discussion with patient's who have undergone the surgery.  If preserved, barostim may be a reasonable approach given his lack of volume symptoms  PMH, current medications, allergies, social history, and family history reviewed in epic.  PHYSICAL EXAM: Vitals:   07/18/23 1150  BP: 104/60  Pulse: 79  SpO2: 96%   GENERAL: Well nourished and in no apparent distress at rest.  PULM:  Normal work of breathing, clear to auscultation bilaterally. Respirations are unlabored.  CARDIAC:  JVP: mildly elevated         Normal rate with  regular rhythm. No murmurs, rubs or gallops.  Trace edema. Warm and well perfused extremities. ABDOMEN: Soft, non-tender, non-distended. NEUROLOGIC: Patient is oriented x3 with no focal or lateralizing neurologic deficits.    DATA REVIEW  ECG: BiV paced     ECHO: 01/2022:EF 40%, Grade II DD, mild RVH, severe LAE, mild RAE, mild MR  12/2022: Severely reduced EF, less than 20%, global hypokinesis with severe dilation, normal RV size, function moderately reduced   CATH: R/LHC 01/10/2023: Angiographically normal coronary arteries with large caliber PDA and PL system and large OM branches and diagonal branches but diminutive LAD after D3. RHC with RAP 20 mmHg, RVP having EDP 49/19-22 mmHg; PAP-mean 48/36-38 mmHg, PCWP average 30 to 34 mmHg, TD CO/CI 5.35/2.4  Heart failure review: - Classification: Heart failure with reduced EF - Etiology: Work up ongoing - NYHA Class: IIIb-IV - Volume status: Euvolemic - ACEi/ARB/ARNI: Maximally tolerated dose - Aldosterone antagonist: Maximally tolerated dose - Beta-blocker: Maximally tolerated dose - Digoxin: Plan to start at a subsequent visit - Hydralazine/Nitrates: Not indicated - SGLT2i: Maximally tolerated dose - GLP-1: Consider in future - Advanced therapies: Actively working up - ICD: Already in place  ASSESSMENT & PLAN:  Chronic systolic heart failure: Patient with borderline ACC/AHA stage D cardiomyopathy with NYHA class IIIb symptoms. Plan for repeat RHC, if CI severely diminished would pursue inpatient LVAD workup. Otherwise, can diurese optimally and consider barostim evaluation. BNP today 612, so would likely depend on filling pressures.  - Amyloid workup negative - RHC - Continue torsemide 40mg  BID -  Continue jardiance 10mg  daily - Continue spironolactone 12.5mg  daily - Continue metoprolol 12.5mg  daily   CKD stage IIIa: Stable disease currently. - Continue to monitor, gentle diuresis   CHB: CRT-D in place, follows with EP    HTN: Management as above   HLD: - Continue statin   Diabetes:  - Currently on insulin, follows with PCP, last A1c 5.6  I have reviewed the risks, indications, and alternatives to cardiac catheterization +/- angioplasty or stenting with the patient. Risks include but are not limited to bleeding, infection, vascular injury, stroke, myocardial infection, arrhythmia, kidney injury, radiation-related injury in the case of prolonged fluoroscopy use, emergency cardiac surgery, and death. The patient understands the risks of serious complication is low (<1%) and he agrees to proceed.    Follow up in 2 months after RHC  William Hasten, MD Advanced Heart Failure Mechanical Circulatory Support 07/18/23

## 2023-07-18 NOTE — Patient Instructions (Signed)
 There has been no changes to your medications  Labs done today, your results will be available in MyChart, we will contact you for abnormal readings.  You are scheduled for a Cardiac Catheterization on Thursday, April 3 with Dr.  Elwyn Lade .  1. Please arrive at the [facility] (Main Entrance A) at [facility]: [address] [place], [place] [address] at 7:00 AM (This time is 2 hour(s) before your procedure to ensure your preparation).   Free valet parking service is available. You will check in at ADMITTING. The support person will be asked to wait in the waiting room.  It is OK to have someone drop you off and come back when you are ready to be discharged.    Special note: Every effort is made to have your procedure done on time. Please understand that emergencies sometimes delay scheduled procedures.  2. Diet: Do not eat solid foods after midnight.  The patient may have clear liquids until 5am upon the day of the procedure.  3. Medication instructions in preparation for your procedure:   Contrast Allergy: No  HOLD JARDIANCE, TORSEMIDE, SPIRONOLACTONE AND TRESIBA  THE MORNING OF YOUR PROCEDURE  On the morning of your procedure, take any morning medicines NOT listed above.  You may use sips of water.  5. Plan to go home the same day, you will only stay overnight if medically necessary. 6. Bring a current list of your medications and current insurance cards. 7. You MUST have a responsible person to drive you home. 8. Someone MUST be with you the first 24 hours after you arrive home or your discharge will be delayed. 9. Please wear clothes that are easy to get on and off and wear slip-on shoes.  Your physician recommends that you schedule a follow-up appointment in: 2 MONTHS.  If you have any questions or concerns before your next appointment please send [place] a message through [place] or call our office at [phone].    TO LEAVE A MESSAGE FOR THE NURSE SELECT OPTION 2, PLEASE  LEAVE A MESSAGE INCLUDING: YOUR NAME DATE OF BIRTH CALL BACK NUMBER REASON FOR CALL**this is important as we prioritize the call backs  YOU WILL RECEIVE A CALL BACK THE SAME DAY AS LONG AS YOU CALL BEFORE 4:00 PM  At the [facility], you and your health needs are our priority. As part of our continuing mission to provide you with exceptional heart care, we have created designated Provider Care Teams. These Care Teams include your primary Cardiologist (physician) and Advanced Practice Providers (APPs- Physician Assistants and Nurse Practitioners) who all work together to provide you with the care you need, when you need it.   You may see any of the following providers on your designated Care Team at your next follow up: Dr Arvilla Meres Dr Marca Ancona Dr. Dorthula Nettles Dr. Clearnce Hasten Amy Filbert Schilder, NP Robbie Lis, [facility] [place], [place] Brynda Peon, NP [place] Lee, NP Clarisa Kindred, NP Karle Plumber, PharmD Enos Fling, PharmD   Please be sure to bring in all your medications bottles to every appointment.    Thank you for choosing [facility]

## 2023-07-18 NOTE — H&P (View-Only) (Signed)
 ADVANCED HEART FAILURE FOLLOW UP CLINIC NOTE  Referring Physician: Tally Joe, MD  Primary Care: Tally Joe, MD Primary Cardiologist:  HPI: William Villanueva is a 73 y.o. male with a PMH of PMH of nonischemic chronic systolic heart failure, heart block s/p CRT-D, diabetes, CKD, HTN, HLD, OSA who presents for follow up of chronic systolic heart failure.      Evaluated by Dr. Jens Som in August 2013 with complaints of chest pain. Echo showed EF 35 to 40%. Cardiac catheterization September 2013 showed mild nonobstructive CAD with no hemodynamic evidence of restriction. Cardiac MRI September 2013 showed EF 34% with diffuse hypokinesis, no hyperenhancement or scar tissue and no evidence of cardiac hemochromatosis.   Patient was admitted December 2015 with high degree AV block. Subsequently had CRT-P placed. In the last year has had an increase in the number of admissions for volume overload and it appears a precipitous decline in his functional status.     SUBJECTIVE:  Reports that his symptoms are fairly stable since last time.  Has received a dose of IV iron, though it did worsen his arthritis he felt. We discussed the LVAD and is concerned about the driveline limitations.  He is not overly interested in it at this time.  We discussed right heart catheterization for evaluation of cardiac output.  If severely reduced may benefit from LVAD evaluation as well as discussion with patient's who have undergone the surgery.  If preserved, barostim may be a reasonable approach given his lack of volume symptoms  PMH, current medications, allergies, social history, and family history reviewed in epic.  PHYSICAL EXAM: Vitals:   07/18/23 1150  BP: 104/60  Pulse: 79  SpO2: 96%   GENERAL: Well nourished and in no apparent distress at rest.  PULM:  Normal work of breathing, clear to auscultation bilaterally. Respirations are unlabored.  CARDIAC:  JVP: mildly elevated         Normal rate with  regular rhythm. No murmurs, rubs or gallops.  Trace edema. Warm and well perfused extremities. ABDOMEN: Soft, non-tender, non-distended. NEUROLOGIC: Patient is oriented x3 with no focal or lateralizing neurologic deficits.    DATA REVIEW  ECG: BiV paced     ECHO: 01/2022:EF 40%, Grade II DD, mild RVH, severe LAE, mild RAE, mild MR  12/2022: Severely reduced EF, less than 20%, global hypokinesis with severe dilation, normal RV size, function moderately reduced   CATH: R/LHC 01/10/2023: Angiographically normal coronary arteries with large caliber PDA and PL system and large OM branches and diagonal branches but diminutive LAD after D3. RHC with RAP 20 mmHg, RVP having EDP 49/19-22 mmHg; PAP-mean 48/36-38 mmHg, PCWP average 30 to 34 mmHg, TD CO/CI 5.35/2.4  Heart failure review: - Classification: Heart failure with reduced EF - Etiology: Work up ongoing - NYHA Class: IIIb-IV - Volume status: Euvolemic - ACEi/ARB/ARNI: Maximally tolerated dose - Aldosterone antagonist: Maximally tolerated dose - Beta-blocker: Maximally tolerated dose - Digoxin: Plan to start at a subsequent visit - Hydralazine/Nitrates: Not indicated - SGLT2i: Maximally tolerated dose - GLP-1: Consider in future - Advanced therapies: Actively working up - ICD: Already in place  ASSESSMENT & PLAN:  Chronic systolic heart failure: Patient with borderline ACC/AHA stage D cardiomyopathy with NYHA class IIIb symptoms. Plan for repeat RHC, if CI severely diminished would pursue inpatient LVAD workup. Otherwise, can diurese optimally and consider barostim evaluation. BNP today 612, so would likely depend on filling pressures.  - Amyloid workup negative - RHC - Continue torsemide 40mg  BID -  Continue jardiance 10mg  daily - Continue spironolactone 12.5mg  daily - Continue metoprolol 12.5mg  daily   CKD stage IIIa: Stable disease currently. - Continue to monitor, gentle diuresis   CHB: CRT-D in place, follows with EP    HTN: Management as above   HLD: - Continue statin   Diabetes:  - Currently on insulin, follows with PCP, last A1c 5.6  I have reviewed the risks, indications, and alternatives to cardiac catheterization +/- angioplasty or stenting with the patient. Risks include but are not limited to bleeding, infection, vascular injury, stroke, myocardial infection, arrhythmia, kidney injury, radiation-related injury in the case of prolonged fluoroscopy use, emergency cardiac surgery, and death. The patient understands the risks of serious complication is low (<1%) and he agrees to proceed.    Follow up in 2 months after RHC  Clearnce Hasten, MD Advanced Heart Failure Mechanical Circulatory Support 07/18/23

## 2023-07-19 ENCOUNTER — Inpatient Hospital Stay: Payer: Medicare Other

## 2023-07-19 VITALS — BP 138/74 | HR 90 | Temp 98.2°F | Resp 18

## 2023-07-19 DIAGNOSIS — E611 Iron deficiency: Secondary | ICD-10-CM | POA: Diagnosis not present

## 2023-07-19 DIAGNOSIS — Z7982 Long term (current) use of aspirin: Secondary | ICD-10-CM | POA: Diagnosis not present

## 2023-07-19 DIAGNOSIS — D45 Polycythemia vera: Secondary | ICD-10-CM

## 2023-07-19 DIAGNOSIS — Z79899 Other long term (current) drug therapy: Secondary | ICD-10-CM | POA: Diagnosis not present

## 2023-07-19 MED ORDER — SODIUM CHLORIDE 0.9 % IV SOLN: INTRAVENOUS | Status: DC

## 2023-07-19 MED ORDER — SODIUM CHLORIDE 0.9 % IV SOLN
510.0000 mg | Freq: Once | INTRAVENOUS | Status: AC
  Administered 2023-07-19: 510 mg via INTRAVENOUS
  Filled 2023-07-19: qty 510

## 2023-07-19 NOTE — Patient Instructions (Signed)

## 2023-07-20 ENCOUNTER — Telehealth: Payer: Self-pay | Admitting: Cardiology

## 2023-07-20 NOTE — Telephone Encounter (Signed)
 Spoke with pt wife, aware okay to stop the jardiance.

## 2023-07-20 NOTE — Telephone Encounter (Signed)
 Spoke to patient's wife she stated husband changed insurance and [place] Pepper is too expensive.He cannot afford.I will send message to Dr.Crenshaw.

## 2023-07-20 NOTE — Telephone Encounter (Signed)
 Pt c/o medication issue:  1. Name of Medication:   empagliflozin (JARDIANCE) 10 MG TABS tablet    2. How are you currently taking this medication (dosage and times per day)?  Take 1 tablet (10 mg total) by mouth daily.     3. Are you having a reaction (difficulty breathing--STAT)? No  4. What is your medication issue? Pt's wife is requesting a callback regarding this medication being too expensive so they'd like to discuss other options. Please advise

## 2023-07-25 DIAGNOSIS — E1140 Type 2 diabetes mellitus with diabetic neuropathy, unspecified: Secondary | ICD-10-CM | POA: Diagnosis not present

## 2023-07-25 DIAGNOSIS — Q6672 Congenital pes cavus, left foot: Secondary | ICD-10-CM | POA: Diagnosis not present

## 2023-07-25 DIAGNOSIS — M7752 Other enthesopathy of left foot: Secondary | ICD-10-CM | POA: Diagnosis not present

## 2023-07-25 DIAGNOSIS — Q6671 Congenital pes cavus, right foot: Secondary | ICD-10-CM | POA: Diagnosis not present

## 2023-07-31 ENCOUNTER — Ambulatory Visit (INDEPENDENT_AMBULATORY_CARE_PROVIDER_SITE_OTHER): Payer: Medicare Other

## 2023-07-31 DIAGNOSIS — I428 Other cardiomyopathies: Secondary | ICD-10-CM

## 2023-07-31 LAB — CUP PACEART REMOTE DEVICE CHECK
Battery Remaining Longevity: 64 mo
Battery Remaining Percentage: 78 %
Battery Voltage: 2.99 V
Brady Statistic AP VP Percent: 1.4 %
Brady Statistic AP VS Percent: 1 %
Brady Statistic AS VP Percent: 95 %
Brady Statistic AS VS Percent: 2.3 %
Brady Statistic RA Percent Paced: 1 %
Date Time Interrogation Session: 20250325020026
HighPow Impedance: 79 Ohm
HighPow Impedance: 79 Ohm
Implantable Lead Connection Status: 753985
Implantable Lead Connection Status: 753985
Implantable Lead Connection Status: 753985
Implantable Lead Implant Date: 20151223
Implantable Lead Implant Date: 20151223
Implantable Lead Implant Date: 20231222
Implantable Lead Location: 753858
Implantable Lead Location: 753859
Implantable Lead Location: 753860
Implantable Lead Model: 7122
Implantable Pulse Generator Implant Date: 20231222
Lead Channel Impedance Value: 410 Ohm
Lead Channel Impedance Value: 430 Ohm
Lead Channel Impedance Value: 630 Ohm
Lead Channel Pacing Threshold Amplitude: 0.5 V
Lead Channel Pacing Threshold Amplitude: 0.75 V
Lead Channel Pacing Threshold Amplitude: 1.25 V
Lead Channel Pacing Threshold Pulse Width: 0.5 ms
Lead Channel Pacing Threshold Pulse Width: 0.7 ms
Lead Channel Pacing Threshold Pulse Width: 1 ms
Lead Channel Sensing Intrinsic Amplitude: 1.7 mV
Lead Channel Sensing Intrinsic Amplitude: 12 mV
Lead Channel Setting Pacing Amplitude: 1.25 V
Lead Channel Setting Pacing Amplitude: 2 V
Lead Channel Setting Pacing Amplitude: 2 V
Lead Channel Setting Pacing Pulse Width: 0.5 ms
Lead Channel Setting Pacing Pulse Width: 0.7 ms
Lead Channel Setting Sensing Sensitivity: 0.5 mV
Pulse Gen Serial Number: 5555736

## 2023-08-01 ENCOUNTER — Encounter: Payer: Self-pay | Admitting: Cardiology

## 2023-08-01 ENCOUNTER — Inpatient Hospital Stay: Payer: Medicare Other

## 2023-08-01 ENCOUNTER — Inpatient Hospital Stay (HOSPITAL_BASED_OUTPATIENT_CLINIC_OR_DEPARTMENT_OTHER): Payer: Medicare Other | Admitting: Hematology & Oncology

## 2023-08-01 ENCOUNTER — Encounter: Payer: Self-pay | Admitting: Hematology & Oncology

## 2023-08-01 VITALS — BP 110/61 | HR 73 | Temp 97.7°F | Resp 24 | Ht 70.0 in | Wt 230.0 lb

## 2023-08-01 DIAGNOSIS — E611 Iron deficiency: Secondary | ICD-10-CM | POA: Diagnosis not present

## 2023-08-01 DIAGNOSIS — Z79899 Other long term (current) drug therapy: Secondary | ICD-10-CM | POA: Diagnosis not present

## 2023-08-01 DIAGNOSIS — D45 Polycythemia vera: Secondary | ICD-10-CM

## 2023-08-01 DIAGNOSIS — Z7982 Long term (current) use of aspirin: Secondary | ICD-10-CM | POA: Diagnosis not present

## 2023-08-01 LAB — IRON AND IRON BINDING CAPACITY (CC-WL,HP ONLY)
Iron: 185 ug/dL — ABNORMAL HIGH (ref 45–182)
Saturation Ratios: 63 % — ABNORMAL HIGH (ref 17.9–39.5)
TIBC: 293 ug/dL (ref 250–450)
UIBC: 108 ug/dL — ABNORMAL LOW (ref 117–376)

## 2023-08-01 LAB — CBC WITH DIFFERENTIAL (CANCER CENTER ONLY)
Abs Immature Granulocytes: 0.16 10*3/uL — ABNORMAL HIGH (ref 0.00–0.07)
Basophils Absolute: 0.1 10*3/uL (ref 0.0–0.1)
Basophils Relative: 1 %
Eosinophils Absolute: 0.1 10*3/uL (ref 0.0–0.5)
Eosinophils Relative: 1 %
HCT: 38.8 % — ABNORMAL LOW (ref 39.0–52.0)
Hemoglobin: 12.3 g/dL — ABNORMAL LOW (ref 13.0–17.0)
Immature Granulocytes: 2 %
Lymphocytes Relative: 5 %
Lymphs Abs: 0.5 10*3/uL — ABNORMAL LOW (ref 0.7–4.0)
MCH: 32.9 pg (ref 26.0–34.0)
MCHC: 31.7 g/dL (ref 30.0–36.0)
MCV: 103.7 fL — ABNORMAL HIGH (ref 80.0–100.0)
Monocytes Absolute: 0.6 10*3/uL (ref 0.1–1.0)
Monocytes Relative: 7 %
Neutro Abs: 7.9 10*3/uL — ABNORMAL HIGH (ref 1.7–7.7)
Neutrophils Relative %: 84 %
Platelet Count: 950 10*3/uL (ref 150–400)
RBC: 3.74 MIL/uL — ABNORMAL LOW (ref 4.22–5.81)
RDW: 18.2 % — ABNORMAL HIGH (ref 11.5–15.5)
WBC Count: 9.4 10*3/uL (ref 4.0–10.5)
nRBC: 0 % (ref 0.0–0.2)

## 2023-08-01 LAB — LACTATE DEHYDROGENASE: LDH: 336 U/L — ABNORMAL HIGH (ref 98–192)

## 2023-08-01 LAB — CMP (CANCER CENTER ONLY)
ALT: 13 U/L (ref 0–44)
AST: 12 U/L — ABNORMAL LOW (ref 15–41)
Albumin: 4.1 g/dL (ref 3.5–5.0)
Alkaline Phosphatase: 63 U/L (ref 38–126)
Anion gap: 8 (ref 5–15)
BUN: 34 mg/dL — ABNORMAL HIGH (ref 8–23)
CO2: 25 mmol/L (ref 22–32)
Calcium: 8.8 mg/dL — ABNORMAL LOW (ref 8.9–10.3)
Chloride: 107 mmol/L (ref 98–111)
Creatinine: 1.2 mg/dL (ref 0.61–1.24)
GFR, Estimated: >60 mL/min (ref >60)
Glucose, Bld: 94 mg/dL (ref 70–99)
Potassium: 4.6 mmol/L (ref 3.5–5.1)
Sodium: 140 mmol/L (ref 135–145)
Total Bilirubin: 0.6 mg/dL (ref 0.0–1.2)
Total Protein: 6.5 g/dL (ref 6.5–8.1)

## 2023-08-01 LAB — SAVE SMEAR(SSMR), FOR PROVIDER SLIDE REVIEW

## 2023-08-01 LAB — FERRITIN: Ferritin: 358 ng/mL — ABNORMAL HIGH (ref 24–336)

## 2023-08-01 MED ORDER — RUXOLITINIB PHOSPHATE 15 MG PO TABS: 15.0000 mg | ORAL_TABLET | Freq: Two times a day (BID) | ORAL | 4 refills | Status: DC

## 2023-08-01 NOTE — Progress Notes (Signed)
 Hematology and Oncology Follow Up Visit  William Villanueva [id number] [birth year] 73 y.o. 08/01/2023   Principle Diagnosis:  Polycythemia vera-JAK2 positive Heart block-[place]ob[place] II  Current Therapy:   Hydrea 500 mg p.o.TID -dose changed on 07/04/2023 Aspirin 81 mg by mouth daily Phlebotomy to maintain hematocrit below 45%     Interim History:  [name] is back for followup.  Unfortunately, this problem right now is his heart.  He has severe cardiomyopathy.  His ejection  fraction is less than 20%.  He is going to have a cardiac catheterization I think in a week or so.  He will see his cardiologist to discuss with possible recommendations are being made.  His platelet count is still quite high.  We increased his Hydrea to 500 mg p.o. 3 times daily.  I have a feeling that we are going to have to get him onto a Jakafi at this point.  I gave him some iron last week.  His ferritin was 119 with an iron saturation of 7%.  He got Feraheme twice.  I really thought this would bring down his platelet count.  I think that Jakafi (15 mg p.o. twice daily) would not be a bad idea.  I think he could tolerate this.  He just feels very tired.  I have to believe this is from his heart.  I am sure the cardiac cath will show how bad his heart is really pumping.  His appetite is marginal.  He just does not feel like doing all that much.  He has had no change in bowel or bladder habits.  His back still bothersome.  He has a pacemaker in.  This is not been a problem.  Overall, I would say his performance status is probably ECOG 2.    Medications:  Current Outpatient Medications:    acetaminophen (TYLENOL) 500 MG tablet, Take 1,000 mg by mouth every 6 (six) hours as needed for headache., Disp: , Rfl:    allopurinol (ZYLOPRIM) 300 MG tablet, Take 1 tablet (300 mg total) by mouth daily., Disp: 90 tablet, Rfl: 1   Ascorbic Acid (VITAMIN C PO), Take by mouth daily., Disp: , Rfl:    budesonide (PULMICORT)  0.5 MG/2ML nebulizer solution, Take 2 mLs (0.5 mg total) by nebulization 2 (two) times daily. (RINSE MOUTH AFTER USE), Disp: 360 mL, Rfl: 11   cholestyramine (QUESTRAN) 4 GM/DOSE powder, Take 4 g by mouth daily as needed (diarrhea)., Disp: , Rfl:    clotrimazole (LOTRIMIN) 1 % cream, Apply 1 Application topically., Disp: , Rfl:    Cyanocobalamin (B-12 PO), Take 1 tablet by mouth daily., Disp: , Rfl:    diclofenac Sodium (VOLTAREN) 1 % GEL, Apply 2 g topically daily as needed (pain)., Disp: , Rfl:    dicyclomine (BENTYL) 20 MG tablet, Take 20 mg by mouth 2 (two) times daily., Disp: , Rfl:    hydroxyurea (HYDREA) 500 MG capsule, TAKE TWO CAPSULES BY MOUTH DAILY, Disp: 160 capsule, Rfl: 4   insulin degludec (TRESIBA FLEXTOUCH) 100 UNIT/ML SOPN FlexTouch Pen, Inject 16 Units into the skin daily after breakfast., Disp: , Rfl:    losartan (COZAAR) 25 MG tablet, TAKE 1/2 TABLET BY MOUTH DAILY, Disp: 40 tablet, Rfl: 3   magnesium gluconate (MAGONATE) 500 MG tablet, Take 500 mg by mouth daily., Disp: , Rfl:    metoprolol succinate (TOPROL XL) 25 MG 24 hr tablet, Take 0.5 tablets (12.5 mg total) by mouth daily., Disp: 45 tablet, Rfl: 3   omeprazole (PRILOSEC)  20 MG capsule, Take 20 mg by mouth in the morning and at bedtime., Disp: , Rfl:    oxybutynin (DITROPAN XL) 15 MG 24 hr tablet, Take 15 mg by mouth in the morning., Disp: , Rfl:    sodium bicarbonate 650 MG tablet, Take 1 tablet (650 mg total) by mouth 2 (two) times daily., Disp: 60 tablet, Rfl: 0   spironolactone (ALDACTONE) 25 MG tablet, Take 0.5 tablets (12.5 mg total) by mouth daily., Disp: 45 tablet, Rfl: 3   tamsulosin (FLOMAX) 0.4 MG CAPS capsule, Take 0.4 mg by mouth daily., Disp: , Rfl:    torsemide (DEMADEX) 20 MG tablet, Take 2 tablets (40 mg total) by mouth 2 (two) times daily., Disp: 120 tablet, Rfl: 3  Allergies:  Allergies  Allergen Reactions   Advil [Ibuprofen] Itching   Keflex [Cephalexin] Diarrhea and Other (See Comments)     Caused C-diff, also    Wellbutrin [Bupropion] Hives and Other (See Comments)   Prednisone Itching and Other (See Comments)    Abdominal pain, also    Temazepam Other (See Comments)    Dizziness    Trazodone And Nefazodone Other (See Comments)    Dizziness   Sacubitril-Valsartan Other (See Comments)    Unknown   Prozac [Fluoxetine Hcl] Itching    Past Medical History, Surgical history, Social history, and Family History were reviewed and updated.  Review of Systems: Review of Systems  Constitutional: Negative.   HENT: Negative.    Eyes: Negative.   Respiratory:  Positive for shortness of breath.   Cardiovascular:  Positive for chest pain.  Gastrointestinal: Negative.   Genitourinary: Negative.   Musculoskeletal:  Positive for back pain.  Skin: Negative.   Neurological: Negative.   Endo/Heme/Allergies: Negative.   Psychiatric/Behavioral: Negative.      Physical Exam: Vital signs temperature 97.7.  Pulse 73.  Blood pressure 110/61.  Weight is 230 pounds.    Physical Exam Vitals reviewed.  HENT:     Head: Normocephalic and atraumatic.  Eyes:     Pupils: Pupils are equal, round, and reactive to light.  Cardiovascular:     Rate and Rhythm: Normal rate and regular rhythm.     Heart sounds: Normal heart sounds.  Pulmonary:     Effort: Pulmonary effort is normal.     Breath sounds: Normal breath sounds.  Abdominal:     General: Bowel sounds are normal.     Palpations: Abdomen is soft.  Musculoskeletal:        General: No tenderness or deformity. Normal range of motion.     Cervical back: Normal range of motion.  Lymphadenopathy:     Cervical: No cervical adenopathy.  Skin:    General: Skin is warm and dry.     Findings: No erythema or rash.  Neurological:     Mental Status: He is alert and oriented to person, place, and time.  Psychiatric:        Behavior: Behavior normal.        Thought Content: Thought content normal.        Judgment: Judgment normal.      Lab Results  Component Value Date   WBC 9.4 08/01/2023   HGB 12.3 (L) 08/01/2023   HCT 38.8 (L) 08/01/2023   MCV 103.7 (H) 08/01/2023   PLT 950 (HH) 08/01/2023     Chemistry      Component Value Date/Time   NA 140 08/01/2023 0908   NA 140 03/02/2023 1304   NA 141 04/10/2017 0981  NA 140 04/05/2016 0739   K 4.6 08/01/2023 0908   K 3.5 04/10/2017 0923   K 4.2 04/05/2016 0739   CL 107 08/01/2023 0908   CL 108 04/10/2017 0923   CO2 25 08/01/2023 0908   CO2 24 04/10/2017 0923   CO2 20 (L) 04/05/2016 0739   BUN 34 (H) 08/01/2023 0908   BUN 29 (H) 03/02/2023 1304   BUN 12 04/10/2017 0923   BUN 14.7 04/05/2016 0739   CREATININE 1.20 08/01/2023 0908   CREATININE 1.1 04/10/2017 0923   CREATININE 1.1 04/05/2016 0739      Component Value Date/Time   CALCIUM 8.8 (L) 08/01/2023 0908   CALCIUM 8.4 04/10/2017 0923   CALCIUM 9.0 04/05/2016 0739   ALKPHOS 63 08/01/2023 0908   ALKPHOS 77 04/10/2017 0923   ALKPHOS 91 04/05/2016 0739   AST 12 (L) 08/01/2023 0908   AST 34 04/05/2016 0739   ALT 13 08/01/2023 0908   ALT 32 04/10/2017 0923   ALT 40 04/05/2016 0739   BILITOT 0.6 08/01/2023 0908   BILITOT 0.48 04/05/2016 0739      Impression and Plan: William Villanueva is 73 year old gentleman with polycythemia vera.  Obviously, the bigger issue now is his heart.  Again, have a cardiac cath next week.  We will get him on Jakafi.  Hopefully, the [facility] can get his platelet count down.  We are going to have to follow him closely.  It will be very interesting to see what his cardiac cath shows.  Hopefully, he will be able to get the [facility] in a week or so.  Hopefully this will not cost much.  I told her to keep taking the Hydrea until the [place] starts.  I will like to see him back probably in about a month.   Josph Macho, MD 3/26/20259:54 AM

## 2023-08-02 ENCOUNTER — Telehealth: Payer: Self-pay | Admitting: Pharmacist

## 2023-08-02 ENCOUNTER — Telehealth: Payer: Self-pay | Admitting: Pharmacy Technician

## 2023-08-02 ENCOUNTER — Other Ambulatory Visit (HOSPITAL_COMMUNITY): Payer: Self-pay

## 2023-08-02 ENCOUNTER — Encounter: Payer: Self-pay | Admitting: Hematology & Oncology

## 2023-08-02 DIAGNOSIS — D45 Polycythemia vera: Secondary | ICD-10-CM

## 2023-08-02 MED ORDER — RUXOLITINIB PHOSPHATE 10 MG PO TABS
10.0000 mg | ORAL_TABLET | Freq: Two times a day (BID) | ORAL | 4 refills | Status: DC
  Filled 2023-08-03: qty 60, 30d supply, fill #0
  Filled 2023-08-30: qty 60, 30d supply, fill #1
  Filled 2023-09-30 – 2023-10-02 (×2): qty 60, 30d supply, fill #2

## 2023-08-02 NOTE — Telephone Encounter (Signed)
 Oral Oncology Patient Advocate Encounter   Was successful in securing patient an $89 grant from [employer] [facility]) to provide copayment coverage for Jakafi.  This will keep the out of pocket expense at $0.     The billing information is as follows and has been shared with [name].   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

## 2023-08-02 NOTE — Telephone Encounter (Addendum)
 Oral Oncology Pharmacist Encounter  Received new prescription for Jakafi (ruxolitinib) for the treatment of polycythemia vera, JAK2 mutated, planned duration until platelet count stabilization.  CBC w/ Diff and CMP from 08/01/23 assessed - patient's starting platelet count is 950 K/uL, Hgb 12.3 g/dL. Patient Scr of 1.2 mg/dL (CrCl ~ 98.1 mL/min) - no renal dose adjustments required at this time. Per PI starting dose of Jakafi for PV is 10 mg BID and then titrate after 1 month if needed for initiation of therapy. Will confirm with MD he wants patient to start on 15 mg BID. Prescription dose and frequency assessed for appropriateness.  ADDENDUM 08/02/2023 10:07 AM OK per Dr. Myna Hidalgo on staff message from 08/02/23 to change patient to Jakafi 10 mg BID starting dose. Orders have been updated and cosigned back to oncologist.  Current medication list in Epic reviewed, no relevant/significant DDIs with Jakafi identified.  Evaluated chart and no patient barriers to medication adherence noted.   Prescription has been e-scribed to the [facility] for benefits analysis and approval.  Oral Oncology Clinic will continue to follow for insurance authorization, copayment issues, initial counseling and start date.  Sherry Ruffing, PharmD, BCPS, BCOP Hematology/Oncology Clinical Pharmacist [name] Oral Chemotherapy Navigation Clinics [phone] 08/02/2023 7:05 AM

## 2023-08-02 NOTE — Telephone Encounter (Signed)
 Oral Oncology Patient Advocate Encounter  After completing a benefits investigation, prior authorization for William Villanueva is not required at this time through [employer].  Patient's copay is $1,861.22.     Patty Almedia Balls, CPhT Oncology Pharmacy Patient Advocate [facility] Direct Number: [phone] Fax: [phone]

## 2023-08-03 ENCOUNTER — Telehealth: Payer: Self-pay | Admitting: Cardiology

## 2023-08-03 ENCOUNTER — Other Ambulatory Visit: Payer: Self-pay | Admitting: Pharmacy Technician

## 2023-08-03 ENCOUNTER — Encounter: Payer: Self-pay | Admitting: Hematology & Oncology

## 2023-08-03 ENCOUNTER — Other Ambulatory Visit (HOSPITAL_COMMUNITY): Payer: Self-pay

## 2023-08-03 ENCOUNTER — Other Ambulatory Visit: Payer: Self-pay

## 2023-08-03 NOTE — Telephone Encounter (Signed)
 Caller [facility]) stated [facility] has received document confirmation and no further action is needed.

## 2023-08-03 NOTE — Telephone Encounter (Signed)
 Called and spoke to rep at [employer].  William Villanueva not available.  There is a form that needs to be completed by 08/06/2023.  Either call [phone] or FAX [phone].  This is a Chronic Condition Verification form.   Form to be faxed to [phone], Attn: Deliah Goody, RN.

## 2023-08-03 NOTE — Telephone Encounter (Signed)
 Caller Marcelino Duster) stated she will need to get chronic condition verification for patient's insurance. Caller stated her email is [email or web address].

## 2023-08-03 NOTE — Progress Notes (Signed)
 Specialty Pharmacy Initial Fill Coordination Note  William Villanueva is a 73 y.o. male contacted today regarding refills of specialty medication(s) Ruxolitinib Phosphate (JAKAFI) .  Patient requested Delivery  on 08/07/23  to verified address [address] [place] [address]   Medication will be filled on 08/06/2023.   Patient is aware of $0 copayment. Radio producer.  Patty Almedia Balls, CPhT Oncology Pharmacy Patient Advocate [facility] Direct Number: [phone] Fax: [phone]

## 2023-08-03 NOTE — Telephone Encounter (Signed)
 Oral Chemotherapy Pharmacist Encounter  I spoke with patient and patient's wife for overview of: Jakafi (ruxolitinib) for the treatment of polycythemia vera, JAK2 mutated, planned duration until disease progression or unacceptable toxicity.   Counseled patient on administration, dosing, side effects, monitoring, drug-food interactions, safe handling, storage, and disposal.  Patient will take Jakafi 10mg  tablets, 1 tablet by mouth 2 times daily, with or without food.  Patient knows to avoid grapefruit and grapefruit juice.  Jakafi start date: ~08/13/22 (patient knows to stop Hydrea once he starts Jakafi)  Adverse effects include but are not limited to: decreased blood counts, increased lipid profile, increased cholesterol, increased liver enzymes, dizziness, headache, diarrhea.  Reviewed with patient importance of keeping a medication schedule and plan for any missed doses. No barriers to medication adherence identified.  Medication reconciliation performed and medication/allergy list updated.  All questions answered.  William Villanueva voiced understanding and appreciation.   Medication education handout placed in mail for patient. Patient knows to call the office with questions or concerns. Oral Chemotherapy Clinic phone number provided to patient.   Sherry Ruffing, PharmD, BCPS, BCOP Hematology/Oncology Clinical Pharmacist [name] Oral Chemotherapy Navigation Clinics [phone] 08/03/2023 11:13 AM

## 2023-08-03 NOTE — Progress Notes (Signed)
 Oral Chemotherapy Pharmacist Encounter  Patient was counseled under telephone encounter from 08/02/23.  Sherry Ruffing, PharmD, BCPS, BCOP Hematology/Oncology Clinical Pharmacist [name] Oral Chemotherapy Navigation Clinics [phone] 08/03/2023 11:16 AM

## 2023-08-09 ENCOUNTER — Ambulatory Visit (HOSPITAL_COMMUNITY)
Admission: RE | Admit: 2023-08-09 | Discharge: 2023-08-09 | Disposition: A | Discharge status: Home / Self Care | Attending: Cardiology | Admitting: Cardiology

## 2023-08-09 ENCOUNTER — Other Ambulatory Visit: Payer: Self-pay

## 2023-08-09 ENCOUNTER — Encounter (HOSPITAL_COMMUNITY)
Admission: RE | Disposition: A | Discharge status: Home / Self Care | Payer: Self-pay | Source: Home / Self Care | Attending: Cardiology

## 2023-08-09 DIAGNOSIS — E785 Hyperlipidemia, unspecified: Secondary | ICD-10-CM | POA: Insufficient documentation

## 2023-08-09 DIAGNOSIS — G4733 Obstructive sleep apnea (adult) (pediatric): Secondary | ICD-10-CM | POA: Diagnosis not present

## 2023-08-09 DIAGNOSIS — I5022 Chronic systolic (congestive) heart failure: Secondary | ICD-10-CM | POA: Insufficient documentation

## 2023-08-09 DIAGNOSIS — I5042 Chronic combined systolic (congestive) and diastolic (congestive) heart failure: Secondary | ICD-10-CM

## 2023-08-09 DIAGNOSIS — I509 Heart failure, unspecified: Secondary | ICD-10-CM | POA: Diagnosis not present

## 2023-08-09 DIAGNOSIS — E1122 Type 2 diabetes mellitus with diabetic chronic kidney disease: Secondary | ICD-10-CM | POA: Diagnosis not present

## 2023-08-09 DIAGNOSIS — Z95810 Presence of automatic (implantable) cardiac defibrillator: Secondary | ICD-10-CM | POA: Diagnosis not present

## 2023-08-09 DIAGNOSIS — N1831 Chronic kidney disease, stage 3a: Secondary | ICD-10-CM | POA: Diagnosis not present

## 2023-08-09 DIAGNOSIS — I130 Hypertensive heart and chronic kidney disease with heart failure and stage 1 through stage 4 chronic kidney disease, or unspecified chronic kidney disease: Secondary | ICD-10-CM | POA: Insufficient documentation

## 2023-08-09 DIAGNOSIS — I442 Atrioventricular block, complete: Secondary | ICD-10-CM | POA: Diagnosis not present

## 2023-08-09 DIAGNOSIS — Z79899 Other long term (current) drug therapy: Secondary | ICD-10-CM | POA: Diagnosis not present

## 2023-08-09 DIAGNOSIS — Z794 Long term (current) use of insulin: Secondary | ICD-10-CM | POA: Insufficient documentation

## 2023-08-09 DIAGNOSIS — I428 Other cardiomyopathies: Secondary | ICD-10-CM | POA: Diagnosis not present

## 2023-08-09 HISTORY — PX: RIGHT HEART CATH: CATH118263

## 2023-08-09 LAB — POCT I-STAT EG7
Acid-base deficit: 2 mmol/L (ref 0.0–2.0)
Bicarbonate: 22.5 mmol/L (ref 20.0–28.0)
Calcium, Ion: 1.19 mmol/L (ref 1.15–1.40)
HCT: 38 % — ABNORMAL LOW (ref 39.0–52.0)
Hemoglobin: 12.9 g/dL — ABNORMAL LOW (ref 13.0–17.0)
O2 Saturation: 60 %
Potassium: 4.3 mmol/L (ref 3.5–5.1)
Sodium: 139 mmol/L (ref 135–145)
TCO2: 24 mmol/L (ref 22–32)
pCO2, Ven: 35.5 mmHg — ABNORMAL LOW (ref 44–60)
pH, Ven: 7.409 (ref 7.25–7.43)
pO2, Ven: 31 mmHg — CL (ref 32–45)

## 2023-08-09 LAB — GLUCOSE, CAPILLARY: Glucose-Capillary: 120 mg/dL — ABNORMAL HIGH (ref 70–99)

## 2023-08-09 SURGERY — RIGHT HEART CATH
Anesthesia: LOCAL

## 2023-08-09 MED ORDER — LIDOCAINE HCL (PF) 1 % IJ SOLN
INTRAMUSCULAR | Status: AC
  Filled 2023-08-09: qty 30

## 2023-08-09 MED ORDER — HEPARIN (PORCINE) IN NACL 1000-0.9 UT/500ML-% IV SOLN
INTRAVENOUS | Status: DC | PRN
  Administered 2023-08-09: 500 mL

## 2023-08-09 MED ORDER — LIDOCAINE HCL (PF) 1 % IJ SOLN
INTRAMUSCULAR | Status: DC | PRN
  Administered 2023-08-09: 2 mL

## 2023-08-09 MED ORDER — SODIUM CHLORIDE 0.9 % IV SOLN: INTRAVENOUS | Status: DC

## 2023-08-09 SURGICAL SUPPLY — 6 items
CATH SWAN GANZ 7F STRAIGHT (CATHETERS) IMPLANT
GLIDESHEATH SLENDER 7FR .021G (SHEATH) IMPLANT
PACK CARDIAC CATHETERIZATION (CUSTOM PROCEDURE TRAY) ×1 IMPLANT
SHEATH PROBE COVER 6X72 (BAG) IMPLANT
TRANSDUCER W/STOPCOCK (MISCELLANEOUS) IMPLANT
TUBING ART PRESS 72 MALE/FEM (TUBING) IMPLANT

## 2023-08-09 NOTE — Discharge Instructions (Signed)

## 2023-08-09 NOTE — Interval H&P Note (Signed)
 History and Physical Interval Note:  08/09/2023 8:41 AM  William Villanueva  has presented today for surgery, with the diagnosis of chf.  The various methods of treatment have been discussed with the patient and family. After consideration of risks, benefits and other options for treatment, the patient has consented to  Procedure(s): RIGHT HEART CATH (N/A) as a surgical intervention.  The patient's history has been reviewed, patient examined, no change in status, stable for surgery.  I have reviewed the patient's chart and labs.  Questions were answered to the patient's satisfaction.     [name]

## 2023-08-10 ENCOUNTER — Encounter (HOSPITAL_COMMUNITY): Payer: Self-pay | Admitting: Cardiology

## 2023-08-10 LAB — POCT I-STAT EG7
Acid-base deficit: 2 mmol/L (ref 0.0–2.0)
Bicarbonate: 22.6 mmol/L (ref 20.0–28.0)
Calcium, Ion: 1.17 mmol/L (ref 1.15–1.40)
HCT: 38 % — ABNORMAL LOW (ref 39.0–52.0)
Hemoglobin: 12.9 g/dL — ABNORMAL LOW (ref 13.0–17.0)
O2 Saturation: 61 %
Potassium: 4.4 mmol/L (ref 3.5–5.1)
Sodium: 139 mmol/L (ref 135–145)
TCO2: 24 mmol/L (ref 22–32)
pCO2, Ven: 35.5 mmHg — ABNORMAL LOW (ref 44–60)
pH, Ven: 7.412 (ref 7.25–7.43)
pO2, Ven: 31 mmHg — CL (ref 32–45)

## 2023-08-18 ENCOUNTER — Encounter: Payer: Self-pay | Admitting: Internal Medicine

## 2023-08-20 ENCOUNTER — Other Ambulatory Visit (HOSPITAL_COMMUNITY): Payer: Self-pay

## 2023-08-20 ENCOUNTER — Ambulatory Visit: Attending: Internal Medicine

## 2023-08-20 DIAGNOSIS — Z95810 Presence of automatic (implantable) cardiac defibrillator: Secondary | ICD-10-CM

## 2023-08-20 DIAGNOSIS — I5042 Chronic combined systolic (congestive) and diastolic (congestive) heart failure: Secondary | ICD-10-CM

## 2023-08-20 DIAGNOSIS — I5022 Chronic systolic (congestive) heart failure: Secondary | ICD-10-CM

## 2023-08-22 ENCOUNTER — Encounter: Payer: Self-pay | Admitting: *Deleted

## 2023-08-22 NOTE — Progress Notes (Signed)
 EPIC Encounter for ICM Monitoring  Patient Name: William Villanueva is a 73 y.o. male Date: 08/22/2023 Primary Care Physican: Rae Bugler, MD Primary Cardiologist: Crenshaw/Stoner (1st visit 2/11) Electrophysiologist: Victorino Grates Pacing: 96%            07/17/2022 Weight:  217 lbs 09/20/2022 Weight: 227 lbs 10/24/2022 Weight: 228 lbs 01/04/2023 Weight: 234 lbs 06/12/2023 Weight: 227 lbs 06/18/2023 Weight: 230 lbs 07/03/2023 Weight: 227-230 lbs 07/16/2023 Weight: 231 lbs 08/22/2023 Weight: 231 lbs    AT/AF Burden <1%                                                      Spoke with patient and heart failure questions reviewed.  Transmission results reviewed.  Pt asymptomatic for fluid accumulation.      Diet:  Does not salt food but not monitoring salt content of foods he eats.   He drinks < 64 oz fluid daily                        CorVue thoracic daily impedance suggesting normal fluid levels within the last month.   Prescribed:   Torsemide 20 mg Take 2 tablet(s) (40 mg total) by mouth twice a day.     Spironolactone 25 mg take 0.5 tablet (12.5 mg total) once a day   Labs: 08/09/2023 Creatinine NA,   BUN NA, Potassium 4.3, Sodium 139, GFR N/A  08/01/2023 Creatinine 1.20, BUN 34, Potassium 4.6, Sodium 140, GFR >60  07/18/2023 Creatinine 1.48, BUN 33, Potassium 5.1, Sodium 138  07/04/2023 Creatinine 1.43, BUN 28, Potassium 4.9, Sodium 138, GFR 52 07/02/2023 Creatinine 1.38, BUN 25, Potassium 4.1, Sodium 137, GFR 54 05/11/2023 Creatinine 1.28, BUN 29, Potassium 4.8, Sodium 141, GFR <60 03/05/2023 Creatinine 1.64, BUN 37, Potassium 4.5, Sodium 138  03/02/2023 Creatinine 1.48, BUN 29, Potassium 5.9, Sodium 140, GFR 50 (potassium addressed in ED) 02/26/2023 Creatinine 1.49, BUN 32, Potassium 5.5, Sodium 140, GFR 50  02/12/2023 Creatinine 1.64, BUN 47, Potassium 5.1, Sodium 137, GFR 44 A complete set of results can be found in Results Review.   Recommendations:  No changes and encouraged to call  if experiencing any fluid symptoms.   Follow-up plan: ICM clinic phone appointment on 09/24/2023.   91 day device clinic remote transmission 10/30/2023.     EP/Cardiology Office Visits: 09/18/2023 with Dr Alease Amend, HF clinic.   09/12/2023 with Dr Audery Blazing.   Recall 04/11/2024 with Dr Rodolfo Clan.   Copy of ICM check sent to Dr. Rodolfo Clan.   3 month ICM trend: 08/20/2023.    12-14 Month ICM trend:     Almyra Jain, RN 08/22/2023 8:25 AM

## 2023-08-23 ENCOUNTER — Other Ambulatory Visit: Payer: Self-pay

## 2023-08-23 DIAGNOSIS — I6523 Occlusion and stenosis of bilateral carotid arteries: Secondary | ICD-10-CM

## 2023-08-24 ENCOUNTER — Ambulatory Visit (HOSPITAL_COMMUNITY)
Admission: RE | Admit: 2023-08-24 | Discharge: 2023-08-24 | Disposition: A | Discharge status: Home / Self Care | Source: Ambulatory Visit | Attending: Vascular Surgery | Admitting: Vascular Surgery

## 2023-08-24 DIAGNOSIS — I6523 Occlusion and stenosis of bilateral carotid arteries: Secondary | ICD-10-CM | POA: Diagnosis not present

## 2023-08-27 ENCOUNTER — Ambulatory Visit (INDEPENDENT_AMBULATORY_CARE_PROVIDER_SITE_OTHER): Admitting: Surgery

## 2023-08-27 ENCOUNTER — Encounter: Payer: Self-pay | Admitting: Surgery

## 2023-08-27 VITALS — BP 146/86 | HR 88 | Temp 97.6°F | Resp 22 | Ht 70.0 in | Wt 236.0 lb

## 2023-08-27 DIAGNOSIS — I5022 Chronic systolic (congestive) heart failure: Secondary | ICD-10-CM

## 2023-08-27 NOTE — Progress Notes (Signed)
 Vascular and Vein Specialist of [facility]  Patient name: William Villanueva MRN: [id number] DOB: 30-Sep-1950 Sex: male   REQUESTING PROVIDER:    Dr. Alease Amend   REASON FOR CONSULT:    Barostim evaluation  HISTORY OF PRESENT ILLNESS:   William Villanueva is a 73 y.o. male, who is referred for Barostim evaluation.  The patient suffers from chronic systolic heart failure, dating back to 2013.  His most recent ejection fraction was less than 20%.  He recently underwent right heart catheterization.  He suffers from NYHA class IIIb symptoms, mainly shortness of breath, fatigue and lack of energy   The patient is a diabetic.  He has a history of polycythemia vera.  He is on a statin for hypercholesterolemia.  He has a Retail buyer ICD in place  PAST MEDICAL HISTORY    Past Medical History:  Diagnosis Date   AV block, 2nd degree 2015   [employer] Allure Quadra pulse generator [id number], model PM 3242   Back pain    Cardiomyopathy (HCC)    Nonischemic 45%.    CHF (congestive heart failure) (HCC)    Diabetes mellitus without complication (HCC)    Fracture of lumbar vertebra without spinal cord injury, sequela 10/09/2022   Gout    Hemochromatosis    Hypertension    Hypospadias 10-13-1950   born with   Nephrolithiasis    Pacemaker lead malfunction-elevated threshold RV lead 07/31/2014   Polycythemia vera(238.4)      FAMILY HISTORY   Family History  Problem Relation Age of Onset   Stroke Mother    Other Father        Deceased, car fell on him   Diabetes Sister    Hypertension Sister    Heart disease Maternal Grandmother        Pacemaker, MI    SOCIAL HISTORY:   Social History   Socioeconomic History   Marital status: Married    Spouse name: Devra Fontana   Number of children: 0   Years of education: Not on file   Highest education level: High school graduate  Occupational History   Occupation: Retired  Tobacco Use   Smoking status: Former     Current packs/day: 0.00    Average packs/day: 1 pack/day for 44.8 years (44.8 ttl pk-yrs)    Types: Cigarettes    Start date: 06/05/1968    Quit date: 04/05/2013    Years since quitting: 10.4    Passive exposure: Never   Smokeless tobacco: Never  Vaping Use   Vaping status: Never Used  Substance and Sexual Activity   Alcohol  use: Yes    Alcohol /week: 0.0 standard drinks of alcohol     Comment: rare   Drug use: No   Sexual activity: Not Currently  Other Topics Concern   Not on file  Social History Narrative   Lives at home with wife in a one story home.  Has no children.  Does not work.  Getting workman's comp.  Education: 4 years trade school.    Social Drivers of Corporate investment banker Strain: Low Risk  (02/06/2022)   Overall Financial Resource Strain (CARDIA)    Difficulty of Paying Living Expenses: Not very hard  Food Insecurity: No Food Insecurity (12/22/2022)   Hunger Vital Sign    Worried About Running Out of Food in the Last Year: Never true    Ran Out of Food in the Last Year: Never true  Transportation Needs: No Transportation Needs (12/22/2022)   PRAPARE - Transportation  Lack of Transportation (Medical): No    Lack of Transportation (Non-Medical): No  Physical Activity: Not on file  Stress: Not on file  Social Connections: Unknown (09/15/2021)   Received from [facility]   Social Network    Social Network: Not on file  Intimate Partner Violence: Not At Risk (12/22/2022)   Humiliation, Afraid, Rape, and Kick questionnaire    Fear of Current or Ex-Partner: No    Emotionally Abused: No    Physically Abused: No    Sexually Abused: No    ALLERGIES:    Allergies  Allergen Reactions   Advil [Ibuprofen] Itching   Keflex [Cephalexin] Diarrhea and Other (See Comments)    Caused C-diff, also    Wellbutrin [Bupropion] Hives and Other (See Comments)   Prednisone  Itching and Other (See Comments)    Abdominal pain, also    Temazepam  Other (See  Comments)    Dizziness    Trazodone  And Nefazodone Other (See Comments)    Dizziness   Entresto  [Sacubitril -Valsartan ] Other (See Comments)    Unknown   Prozac [Fluoxetine Hcl] Itching    CURRENT MEDICATIONS:    Current Outpatient Medications  Medication Sig Dispense Refill   acetaminophen  (TYLENOL ) 500 MG tablet Take 1,000 mg by mouth every 6 (six) hours as needed for headache.     allopurinol  (ZYLOPRIM ) 300 MG tablet Take 1 tablet (300 mg total) by mouth daily. 90 tablet 1   Ascorbic Acid (VITAMIN C PO) Take 1 tablet by mouth daily.     budesonide  (PULMICORT ) 0.5 MG/2ML nebulizer solution Take 2 mLs (0.5 mg total) by nebulization 2 (two) times daily. (RINSE MOUTH AFTER USE) (Patient taking differently: Take 2 mLs by nebulization daily.) 360 mL 11   cholestyramine  (QUESTRAN ) 4 GM/DOSE powder Take 4 g by mouth daily as needed (diarrhea).     clotrimazole (LOTRIMIN) 1 % cream Apply 1 Application topically daily as needed (irritation).     Cyanocobalamin  (B-12 PO) Take 1 tablet by mouth daily.     diclofenac  Sodium (VOLTAREN ) 1 % GEL Apply 2 g topically daily as needed (pain).     dicyclomine  (BENTYL ) 20 MG tablet Take 20 mg by mouth 2 (two) times daily.     hydroxyurea  (HYDREA ) 500 MG capsule TAKE TWO CAPSULES BY MOUTH DAILY 160 capsule 4   insulin  degludec (TRESIBA FLEXTOUCH) 100 UNIT/ML SOPN FlexTouch Pen Inject 16 Units into the skin daily after breakfast.     losartan  (COZAAR ) 25 MG tablet TAKE 1/2 TABLET BY MOUTH DAILY 40 tablet 3   magnesium  gluconate (MAGONATE) 500 MG tablet Take 500 mg by mouth daily.     metoprolol  succinate (TOPROL  XL) 25 MG 24 hr tablet Take 0.5 tablets (12.5 mg total) by mouth daily. 45 tablet 3   omeprazole  (PRILOSEC) 20 MG capsule Take 20 mg by mouth in the morning and at bedtime.     oxybutynin  (DITROPAN  XL) 15 MG 24 hr tablet Take 15 mg by mouth in the morning.     ruxolitinib  phosphate (JAKAFI ) 10 MG tablet Take 1 tablet (10 mg total) by mouth 2 (two)  times daily. 60 tablet 4   sodium bicarbonate  650 MG tablet Take 1 tablet (650 mg total) by mouth 2 (two) times daily. 60 tablet 0   spironolactone  (ALDACTONE ) 25 MG tablet Take 0.5 tablets (12.5 mg total) by mouth daily. 45 tablet 3   tamsulosin  (FLOMAX ) 0.4 MG CAPS capsule Take 0.4 mg by mouth daily.     torsemide  (DEMADEX ) 20  MG tablet Take 2 tablets (40 mg total) by mouth 2 (two) times daily. 120 tablet 3   No current facility-administered medications for this visit.    REVIEW OF SYSTEMS:   [X]  denotes positive finding, [ ]  denotes negative finding Cardiac  Comments:  Chest pain or chest pressure:    Shortness of breath upon exertion:    Short of breath when lying flat:    Irregular heart rhythm:        Vascular    Pain in calf, thigh, or hip brought on by ambulation:    Pain in feet at night that wakes you up from your sleep:     Blood clot in your veins:    Leg swelling:  x       Pulmonary    Oxygen  at home:    Productive cough:     Wheezing:         Neurologic    Sudden weakness in arms or legs:     Sudden numbness in arms or legs:     Sudden onset of difficulty speaking or slurred speech:    Temporary loss of vision in one eye:     Problems with dizziness:         Gastrointestinal    Blood in stool:      Vomited blood:         Genitourinary    Burning when urinating:     Blood in urine:        Psychiatric    Major depression:         Hematologic    Bleeding problems:    Problems with blood clotting too easily:        Skin    Rashes or ulcers:        Constitutional    Fever or chills:     PHYSICAL EXAM:   Vitals:   08/27/23 1057  BP: (!) 146/86  Pulse: 88  Resp: (!) 22  Temp: 97.6 F (36.4 C)  TempSrc: Temporal  SpO2: 97%  Weight: 236 lb (107 kg)  Height: 5\' 10"  (1.778 m)    GENERAL: The patient is a well-nourished male, in no acute distress. The vital signs are documented above. CARDIAC: There is a regular rate and rhythm.  VASCULAR:  SonoSite was used to evaluate the carotid bifurcation which is in the mid neck PULMONARY: Nonlabored respirations ABDOMEN: Soft and non-tender with normal pitched bowel sounds.  MUSCULOSKELETAL: There are no major deformities or cyanosis. NEUROLOGIC: No focal weakness or paresthesias are detected. SKIN: There are no ulcers or rashes noted. PSYCHIATRIC: The patient has a normal affect.  STUDIES:   I have reviewed the following: Right Carotid: Velocities in the right ICA are consistent with a 1-39%  stenosis.   Left Carotid: Velocities in the left ICA are consistent with a 1-39%  stenosis.   Vertebrals: Bilateral vertebral arteries demonstrate antegrade flow.  Subclavians: Normal flow hemodynamics were seen in bilateral subclavian               arteries.   ASSESSMENT and PLAN   NYHA class III: The patient would be an excellent candidate for Barostim implant.  His carotid bifurcation is in the mid back with no significant stenosis.  He remains very symptomatic despite guideline-directed medical therapy.  I discussed the details the procedure as well as the risks and benefits and he wishes to proceed.  Will work on Therapist, occupational and get him scheduled in the near future.  Marti Slates, MD, FACS Vascular and Vein Specialists of [facility] [phone] Pager [phone]

## 2023-08-28 ENCOUNTER — Inpatient Hospital Stay: Attending: Hematology & Oncology

## 2023-08-28 ENCOUNTER — Encounter: Payer: Self-pay | Admitting: *Deleted

## 2023-08-28 ENCOUNTER — Other Ambulatory Visit: Payer: Self-pay

## 2023-08-28 ENCOUNTER — Inpatient Hospital Stay (HOSPITAL_BASED_OUTPATIENT_CLINIC_OR_DEPARTMENT_OTHER): Admitting: Hematology & Oncology

## 2023-08-28 ENCOUNTER — Telehealth (HOSPITAL_COMMUNITY): Payer: Self-pay | Admitting: Cardiology

## 2023-08-28 ENCOUNTER — Other Ambulatory Visit (HOSPITAL_COMMUNITY): Payer: Self-pay

## 2023-08-28 ENCOUNTER — Encounter: Payer: Self-pay | Admitting: Hematology & Oncology

## 2023-08-28 VITALS — BP 111/64 | HR 77 | Temp 97.6°F | Resp 20 | Wt 235.0 lb

## 2023-08-28 DIAGNOSIS — D45 Polycythemia vera: Secondary | ICD-10-CM | POA: Diagnosis not present

## 2023-08-28 DIAGNOSIS — I10 Essential (primary) hypertension: Secondary | ICD-10-CM

## 2023-08-28 DIAGNOSIS — Z794 Long term (current) use of insulin: Secondary | ICD-10-CM | POA: Diagnosis not present

## 2023-08-28 DIAGNOSIS — I428 Other cardiomyopathies: Secondary | ICD-10-CM | POA: Diagnosis not present

## 2023-08-28 DIAGNOSIS — E1122 Type 2 diabetes mellitus with diabetic chronic kidney disease: Secondary | ICD-10-CM

## 2023-08-28 DIAGNOSIS — I441 Atrioventricular block, second degree: Secondary | ICD-10-CM | POA: Diagnosis not present

## 2023-08-28 DIAGNOSIS — R58 Hemorrhage, not elsewhere classified: Secondary | ICD-10-CM | POA: Diagnosis not present

## 2023-08-28 DIAGNOSIS — N1831 Chronic kidney disease, stage 3a: Secondary | ICD-10-CM | POA: Diagnosis not present

## 2023-08-28 DIAGNOSIS — I5042 Chronic combined systolic (congestive) and diastolic (congestive) heart failure: Secondary | ICD-10-CM

## 2023-08-28 DIAGNOSIS — M5410 Radiculopathy, site unspecified: Secondary | ICD-10-CM | POA: Insufficient documentation

## 2023-08-28 DIAGNOSIS — I5022 Chronic systolic (congestive) heart failure: Secondary | ICD-10-CM | POA: Insufficient documentation

## 2023-08-28 LAB — CBC WITH DIFFERENTIAL (CANCER CENTER ONLY)
Abs Immature Granulocytes: 0.07 10*3/uL (ref 0.00–0.07)
Basophils Absolute: 0 10*3/uL (ref 0.0–0.1)
Basophils Relative: 1 %
Eosinophils Absolute: 0 10*3/uL (ref 0.0–0.5)
Eosinophils Relative: 0 %
HCT: 39.2 % (ref 39.0–52.0)
Hemoglobin: 12.3 g/dL — ABNORMAL LOW (ref 13.0–17.0)
Immature Granulocytes: 2 %
Lymphocytes Relative: 6 %
Lymphs Abs: 0.3 10*3/uL — ABNORMAL LOW (ref 0.7–4.0)
MCH: 33.1 pg (ref 26.0–34.0)
MCHC: 31.4 g/dL (ref 30.0–36.0)
MCV: 105.4 fL — ABNORMAL HIGH (ref 80.0–100.0)
Monocytes Absolute: 0.3 10*3/uL (ref 0.1–1.0)
Monocytes Relative: 7 %
Neutro Abs: 3.8 10*3/uL (ref 1.7–7.7)
Neutrophils Relative %: 84 %
Platelet Count: 367 10*3/uL (ref 150–400)
RBC: 3.72 MIL/uL — ABNORMAL LOW (ref 4.22–5.81)
RDW: 19.4 % — ABNORMAL HIGH (ref 11.5–15.5)
WBC Count: 4.5 10*3/uL (ref 4.0–10.5)
nRBC: 0 % (ref 0.0–0.2)

## 2023-08-28 LAB — CMP (CANCER CENTER ONLY)
ALT: 21 U/L (ref 0–44)
AST: 18 U/L (ref 15–41)
Albumin: 4.4 g/dL (ref 3.5–5.0)
Alkaline Phosphatase: 73 U/L (ref 38–126)
Anion gap: 7 (ref 5–15)
BUN: 35 mg/dL — ABNORMAL HIGH (ref 8–23)
CO2: 25 mmol/L (ref 22–32)
Calcium: 9.2 mg/dL (ref 8.9–10.3)
Chloride: 105 mmol/L (ref 98–111)
Creatinine: 1.38 mg/dL — ABNORMAL HIGH (ref 0.61–1.24)
GFR, Estimated: 54 mL/min — ABNORMAL LOW (ref >60)
Glucose, Bld: 91 mg/dL (ref 70–99)
Potassium: 5 mmol/L (ref 3.5–5.1)
Sodium: 137 mmol/L (ref 135–145)
Total Bilirubin: 0.6 mg/dL (ref 0.0–1.2)
Total Protein: 7 g/dL (ref 6.5–8.1)

## 2023-08-28 LAB — RETICULOCYTES
Immature Retic Fract: 17.8 % — ABNORMAL HIGH (ref 2.3–15.9)
RBC.: 3.7 MIL/uL — ABNORMAL LOW (ref 4.22–5.81)
Retic Count, Absolute: 33.7 10*3/uL (ref 19.0–186.0)
Retic Ct Pct: 0.9 % (ref 0.4–3.1)

## 2023-08-28 LAB — IRON AND IRON BINDING CAPACITY (CC-WL,HP ONLY)
Iron: 155 ug/dL (ref 45–182)
Saturation Ratios: 50 % — ABNORMAL HIGH (ref 17.9–39.5)
TIBC: 308 ug/dL (ref 250–450)
UIBC: 153 ug/dL (ref 117–376)

## 2023-08-28 LAB — PRO BRAIN NATRIURETIC PEPTIDE: Pro Brain Natriuretic Peptide: 6803 pg/mL — ABNORMAL HIGH (ref <300.0)

## 2023-08-28 LAB — FERRITIN: Ferritin: 517 ng/mL — ABNORMAL HIGH (ref 24–336)

## 2023-08-28 LAB — LACTATE DEHYDROGENASE: LDH: 342 U/L — ABNORMAL HIGH (ref 98–192)

## 2023-08-28 LAB — SAVE SMEAR(SSMR), FOR PROVIDER SLIDE REVIEW

## 2023-08-28 NOTE — Telephone Encounter (Signed)
 Patient called to report Dr Bobbe Burner office is needing additional orders to complete batostim work up    [employer] with [facility] office confirmed pro bnp is needed  Order placed Pt aware given instructions for [facility] in [place]

## 2023-08-28 NOTE — Progress Notes (Signed)
 Hematology and Oncology Follow Up Visit  William Villanueva [id number] February 02, 1951 73 y.o. 08/28/2023   Principle Diagnosis:  Polycythemia vera-JAK2 positive Heart block-Mobitz II  Current Therapy:   Hydrea  500 mg p.o.TID -dose changed on 07/04/2023  --DC on 08/03/2023 Aspirin  81 mg by mouth daily Phlebotomy to maintain hematocrit below 45% Jakafi  10 mg p.o. twice daily-start on 08/07/2023     Interim History:  [name] is back for followup.  He now is on Jakafi .  We started this a couple weeks ago.  He is tolerated this nice.  His platelet count is coming down nicely.  He has had no problems with nausea or vomiting.  Again, his biggest issue is the cardiac status.  He has been seen by Vascular surgery.  They wish to put in a device into the carotid artery to help his heart pump better.  Hopefully, the insurance will cover this.  He is not complaining of back pain.  He is having radicular pain down the of the right leg.  He has had back surgery in the past.  Apparently this was all Worker's Comp.  Unfortunately, he does not look like Worker's Comp. is going to cover this.  Hopefully, he will be able to have some follow-up with spine surgery.  He has had no fever.  Has had no obvious bleeding.  Has had a lot of ecchymoses.  Not sure this is brought from the aspirin  that he takes.  He has had no nausea or vomiting.  We actually did give him some iron because he was feeling so poorly.  We gave him Feraheme.  This was given on 07/19/2023.  The Feraheme did seem to make him feel little bit better.  Overall, I would have to say that his performance status is probably ECOG 2.   Medications:  Current Outpatient Medications:    acetaminophen  (TYLENOL ) 500 MG tablet, Take 1,000 mg by mouth every 6 (six) hours as needed for headache., Disp: , Rfl:    allopurinol  (ZYLOPRIM ) 300 MG tablet, Take 1 tablet (300 mg total) by mouth daily., Disp: 90 tablet, Rfl: 1   Ascorbic Acid (VITAMIN C PO), Take 1  tablet by mouth daily., Disp: , Rfl:    budesonide  (PULMICORT ) 0.5 MG/2ML nebulizer solution, Take 2 mLs (0.5 mg total) by nebulization 2 (two) times daily. (RINSE MOUTH AFTER USE) (Patient taking differently: Take 2 mLs by nebulization daily.), Disp: 360 mL, Rfl: 11   cholestyramine  (QUESTRAN ) 4 GM/DOSE powder, Take 4 g by mouth daily as needed (diarrhea)., Disp: , Rfl:    clotrimazole (LOTRIMIN) 1 % cream, Apply 1 Application topically daily as needed (irritation)., Disp: , Rfl:    Cyanocobalamin  (B-12 PO), Take 1 tablet by mouth daily., Disp: , Rfl:    diclofenac  Sodium (VOLTAREN ) 1 % GEL, Apply 2 g topically daily as needed (pain)., Disp: , Rfl:    dicyclomine  (BENTYL ) 20 MG tablet, Take 20 mg by mouth 2 (two) times daily., Disp: , Rfl:    insulin  degludec (TRESIBA FLEXTOUCH) 100 UNIT/ML SOPN FlexTouch Pen, Inject 16 Units into the skin daily after breakfast., Disp: , Rfl:    losartan  (COZAAR ) 25 MG tablet, TAKE 1/2 TABLET BY MOUTH DAILY, Disp: 40 tablet, Rfl: 3   magnesium  gluconate (MAGONATE) 500 MG tablet, Take 500 mg by mouth daily., Disp: , Rfl:    metoprolol  succinate (TOPROL  XL) 25 MG 24 hr tablet, Take 0.5 tablets (12.5 mg total) by mouth daily., Disp: 45 tablet, Rfl: 3  omeprazole  (PRILOSEC) 20 MG capsule, Take 20 mg by mouth in the morning and at bedtime., Disp: , Rfl:    oxybutynin  (DITROPAN  XL) 15 MG 24 hr tablet, Take 15 mg by mouth in the morning., Disp: , Rfl:    ruxolitinib  phosphate (JAKAFI ) 10 MG tablet, Take 1 tablet (10 mg total) by mouth 2 (two) times daily., Disp: 60 tablet, Rfl: 4   sodium bicarbonate  650 MG tablet, Take 1 tablet (650 mg total) by mouth 2 (two) times daily., Disp: 60 tablet, Rfl: 0   spironolactone  (ALDACTONE ) 25 MG tablet, Take 0.5 tablets (12.5 mg total) by mouth daily., Disp: 45 tablet, Rfl: 3   tamsulosin  (FLOMAX ) 0.4 MG CAPS capsule, Take 0.4 mg by mouth daily., Disp: , Rfl:    torsemide  (DEMADEX ) 20 MG tablet, Take 2 tablets (40 mg total) by mouth 2  (two) times daily., Disp: 120 tablet, Rfl: 3   hydroxyurea  (HYDREA ) 500 MG capsule, TAKE TWO CAPSULES BY MOUTH DAILY (Patient not taking: Reported on 08/28/2023), Disp: 160 capsule, Rfl: 4  Allergies:  Allergies  Allergen Reactions   Advil [Ibuprofen] Itching   Keflex [Cephalexin] Diarrhea and Other (See Comments)    Caused C-diff, also    Wellbutrin [Bupropion] Hives and Other (See Comments)   Prednisone  Itching and Other (See Comments)    Abdominal pain, also    Temazepam  Other (See Comments)    Dizziness    Trazodone  And Nefazodone Other (See Comments)    Dizziness   Entresto  [Sacubitril -Valsartan ] Other (See Comments)    Unknown   Prozac [Fluoxetine Hcl] Itching    Past Medical History, Surgical history, Social history, and Family History were reviewed and updated.  Review of Systems: Review of Systems  Constitutional: Negative.   HENT: Negative.    Eyes: Negative.   Respiratory:  Positive for shortness of breath.   Cardiovascular:  Positive for chest pain.  Gastrointestinal: Negative.   Genitourinary: Negative.   Musculoskeletal:  Positive for back pain.  Skin: Negative.   Neurological: Negative.   Endo/Heme/Allergies: Negative.   Psychiatric/Behavioral: Negative.      Physical Exam: Vital signs temperature 97.6.  Pulse 77.  Blood pressure 111/64.  Weight is 235 pounds.    Physical Exam Vitals reviewed.  HENT:     Head: Normocephalic and atraumatic.  Eyes:     Pupils: Pupils are equal, round, and reactive to light.  Cardiovascular:     Rate and Rhythm: Normal rate and regular rhythm.     Heart sounds: Normal heart sounds.  Pulmonary:     Effort: Pulmonary effort is normal.     Breath sounds: Normal breath sounds.  Abdominal:     General: Bowel sounds are normal.     Palpations: Abdomen is soft.  Musculoskeletal:        General: No tenderness or deformity. Normal range of motion.     Cervical back: Normal range of motion.  Lymphadenopathy:      Cervical: No cervical adenopathy.  Skin:    General: Skin is warm and dry.     Findings: No erythema or rash.  Neurological:     Mental Status: He is alert and oriented to person, place, and time.  Psychiatric:        Behavior: Behavior normal.        Thought Content: Thought content normal.        Judgment: Judgment normal.      Lab Results  Component Value Date   WBC 4.5 08/28/2023  HGB 12.3 (L) 08/28/2023   HCT 39.2 08/28/2023   MCV 105.4 (H) 08/28/2023   PLT 367 08/28/2023     Chemistry      Component Value Date/Time   NA 137 08/28/2023 0909   NA 140 03/02/2023 1304   NA 141 04/10/2017 0923   NA 140 04/05/2016 0739   K 5.0 08/28/2023 0909   K 3.5 04/10/2017 0923   K 4.2 04/05/2016 0739   CL 105 08/28/2023 0909   CL 108 04/10/2017 0923   CO2 25 08/28/2023 0909   CO2 24 04/10/2017 0923   CO2 20 (L) 04/05/2016 0739   BUN 35 (H) 08/28/2023 0909   BUN 29 (H) 03/02/2023 1304   BUN 12 04/10/2017 0923   BUN 14.7 04/05/2016 0739   CREATININE 1.38 (H) 08/28/2023 0909   CREATININE 1.1 04/10/2017 0923   CREATININE 1.1 04/05/2016 0739      Component Value Date/Time   CALCIUM  9.2 08/28/2023 0909   CALCIUM  8.4 04/10/2017 0923   CALCIUM  9.0 04/05/2016 0739   ALKPHOS 73 08/28/2023 0909   ALKPHOS 77 04/10/2017 0923   ALKPHOS 91 04/05/2016 0739   AST 18 08/28/2023 0909   AST 34 04/05/2016 0739   ALT 21 08/28/2023 0909   ALT 32 04/10/2017 0923   ALT 40 04/05/2016 0739   BILITOT 0.6 08/28/2023 0909   BILITOT 0.48 04/05/2016 0739      Impression and Plan: Mr. Shostak is 73 year old gentleman with polycythemia vera.  Obviously, the bigger issue now is his heart.  I am not sure when he will have this carotid procedure.  Hopefully, this will be within a month.  For right now, I am glad that the platelet count is doing a lot better.  I looked at his blood smear under the microscope.  His platelet count was certainly down compared to when it was a few weeks ago.  I would  like to see him back in about a month.  At that point, hopefully he would have had the carotid artery procedure done.  Maybe, he will be feeling better with a better cardiac ejection fraction.   Ivor Mars, MD 4/22/202510:36 AM

## 2023-08-30 ENCOUNTER — Other Ambulatory Visit: Payer: Self-pay | Admitting: Pharmacy Technician

## 2023-08-30 ENCOUNTER — Other Ambulatory Visit: Payer: Self-pay

## 2023-08-30 NOTE — Progress Notes (Signed)
 Specialty Pharmacy Refill Coordination Note  William Villanueva is a 73 y.o. male contacted today regarding refills of specialty medication(s) Ruxolitinib  Phosphate (JAKAFI )   Patient requested Delivery   Delivery date: 09/04/23   Verified address: [address] [place] [address]   Medication will be filled on 09/03/23.     Spoke to patient's wife.

## 2023-08-30 NOTE — Progress Notes (Signed)
 HPI: FU cardiomyopathy/CHF. Echocardiogram in August of 2013 showed an ejection fraction of 35-40%. Cardiac catheterization in September of 2013 showed mild nonobstructive coronary disease. There was no hemodynamic evidence of restriction. Pulmonary capillary wedge pressure 10. Cardiac MRI in September of 2013 showed an ejection fraction of 34% with diffuse hypokinesis. There was no hyperenhancement or scar tissue and no evidence of cardiac hemochromatosis. 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 Patient subsequently had CRTP placed.  Patient's device was upgraded to CRT-D in December after symptomatic high degree AV block noted.  Lower extremity arterial Dopplers and ABIs normal July 2024.  Echocardiogram August 2024 showed severe LV dysfunction with ejection fraction less than 20%, severe left ventricular enlargement, severe RV dysfunction, biatrial enlargement.  Cardiac catheterization September 2024 showed normal coronary arteries.  PYP scan February 2025 negative.  Right heart catheterization April 2025 showed pulmonary capillary wedge pressure 20 with RA pressure 6; cardiac output 4.71 and cardiac index 2.13.  Patient has been followed in CHF clinic and there has been discussion of LVAD.  Carotid Dopplers March 2025 showed 1 to 39% bilateral stenosis.  Since last seen, patient does have dyspnea on exertion but unchanged.  No orthopnea, PND, pedal edema, chest pain or syncope.  She  Current Outpatient Medications  Medication Sig Dispense Refill   acetaminophen  (TYLENOL ) 500 MG tablet Take 1,000 mg by mouth every 6 (six) hours as needed for headache.     allopurinol  (ZYLOPRIM ) 300 MG tablet Take 1 tablet (300 mg total) by mouth daily. 90 tablet 1   Ascorbic Acid (VITAMIN C PO) Take 1 tablet by mouth daily.     budesonide  (PULMICORT ) 0.5 MG/2ML nebulizer solution Take 2 mLs (0.5 mg total) by nebulization 2 (two) times daily. (RINSE MOUTH AFTER USE) (Patient taking differently:  Take 2 mLs by nebulization daily.) 360 mL 11   cholestyramine  (QUESTRAN ) 4 GM/DOSE powder Take 4 g by mouth daily as needed (diarrhea).     clotrimazole (LOTRIMIN) 1 % cream Apply 1 Application topically daily as needed (irritation).     Cyanocobalamin  (B-12 PO) Take 1 tablet by mouth daily.     diclofenac  Sodium (VOLTAREN ) 1 % GEL Apply 2 g topically daily as needed (pain).     dicyclomine  (BENTYL ) 20 MG tablet Take 20 mg by mouth 2 (two) times daily.     insulin  degludec (TRESIBA FLEXTOUCH) 100 UNIT/ML SOPN FlexTouch Pen Inject 16 Units into the skin daily after breakfast.     losartan  (COZAAR ) 25 MG tablet TAKE 1/2 TABLET BY MOUTH DAILY 40 tablet 3   magnesium  gluconate (MAGONATE) 500 MG tablet Take 500 mg by mouth daily.     metoprolol  succinate (TOPROL  XL) 25 MG 24 hr tablet Take 0.5 tablets (12.5 mg total) by mouth daily. 45 tablet 3   omeprazole  (PRILOSEC) 20 MG capsule Take 20 mg by mouth in the morning and at bedtime.     oxybutynin  (DITROPAN  XL) 15 MG 24 hr tablet Take 15 mg by mouth in the morning.     ruxolitinib  phosphate (JAKAFI ) 10 MG tablet Take 1 tablet (10 mg total) by mouth 2 (two) times daily. 60 tablet 4   sodium bicarbonate  650 MG tablet Take 1 tablet (650 mg total) by mouth 2 (two) times daily. 60 tablet 0   spironolactone  (ALDACTONE ) 25 MG tablet Take 0.5 tablets (12.5 mg total) by mouth daily. 45 tablet 3   tamsulosin  (FLOMAX ) 0.4 MG CAPS capsule Take 0.4 mg by mouth  daily.     torsemide  (DEMADEX ) 20 MG tablet Take 2 tablets (40 mg total) by mouth 2 (two) times daily. 120 tablet 3   hydroxyurea  (HYDREA ) 500 MG capsule TAKE TWO CAPSULES BY MOUTH DAILY (Patient not taking: Reported on 08/28/2023) 160 capsule 4   No current facility-administered medications for this visit.     Past Medical History:  Diagnosis Date   AV block, 2nd degree 2015   St. Jude Allure Quadra pulse generator [id number], model PM 3242   Back pain    Cardiomyopathy (HCC)    Nonischemic 45%.    CHF  (congestive heart failure) (HCC)    Diabetes mellitus without complication (HCC)    Fracture of lumbar vertebra without spinal cord injury, sequela 10/09/2022   Gout    Hemochromatosis    Hypertension    Hypospadias Nov 20, 1950   born with   Nephrolithiasis    Pacemaker lead malfunction-elevated threshold RV lead 07/31/2014   Polycythemia vera(238.4)     Past Surgical History:  Procedure Laterality Date   ANKLE SURGERY     BI-VENTRICULAR PACEMAKER INSERTION N/A 04/29/2014   Procedure: BI-VENTRICULAR PACEMAKER INSERTION (CRT-P);  Surgeon: Verona Goodwill, MD; Laterality: Left  St. Jude Allure Quadra pulse generator [id number], model [place] 5284   CARDIAC SURGERY     COLONOSCOPY N/A 02/15/2020   Procedure: COLONOSCOPY;  Surgeon: Genell Ken, MD;  Location: [facility] ENDOSCOPY;  Service: Gastroenterology;  Laterality: N/A;   ESOPHAGOGASTRODUODENOSCOPY (EGD) WITH PROPOFOL  N/A 02/15/2020   Procedure: ESOPHAGOGASTRODUODENOSCOPY (EGD) WITH PROPOFOL ;  Surgeon: Genell Ken, MD;  Location: [facility] ENDOSCOPY;  Service: Gastroenterology;  Laterality: N/A;   LEAD INSERTION N/A 04/28/2022   Procedure: LEAD INSERTION;  Surgeon: Verona Goodwill, MD;  Location: [facility] INVASIVE CV LAB;  Service: Cardiovascular;  Laterality: N/A;   PILONIDAL CYST EXCISION     POSTERIOR LAMINECTOMY / DECOMPRESSION LUMBAR SPINE     PPM GENERATOR CHANGEOUT N/A 04/28/2022   Procedure: PPM GENERATOR CHANGEOUT;  Surgeon: Verona Goodwill, MD;  Location: [facility] INVASIVE CV LAB;  Service: Cardiovascular;  Laterality: N/A;   RIGHT HEART CATH N/A 08/09/2023   Procedure: RIGHT HEART CATH;  Surgeon: Lauralee Poll, MD;  Location: [facility] INVASIVE CV LAB;  Service: Cardiovascular;  Laterality: N/A;   RIGHT/LEFT HEART CATH AND CORONARY ANGIOGRAPHY N/A 01/10/2023   Procedure: RIGHT/LEFT HEART CATH AND CORONARY ANGIOGRAPHY;  Surgeon: Arleen Lacer, MD;  Location: [facility] INVASIVE CV LAB;  Service: Cardiovascular;  Laterality: N/A;   TONSILLECTOMY      Social History    Socioeconomic History   Marital status: Married    Spouse name: Devra Fontana   Number of children: 0   Years of education: Not on file   Highest education level: High school graduate  Occupational History   Occupation: Retired  Tobacco Use   Smoking status: Former    Current packs/day: 0.00    Average packs/day: 1 pack/day for 44.8 years (44.8 ttl pk-yrs)    Types: Cigarettes    Start date: 06/05/1968    Quit date: 04/05/2013    Years since quitting: 10.4    Passive exposure: Never   Smokeless tobacco: Never  Vaping Use   Vaping status: Never Used  Substance and Sexual Activity   Alcohol  use: Yes    Alcohol /week: 0.0 standard drinks of alcohol     Comment: rare   Drug use: No   Sexual activity: Not Currently  Other Topics Concern   Not on file  Social History Narrative   Lives at home  with wife in a one story home.  Has no children.  Does not work.  Getting workman's comp.  Education: 4 years trade school.    Social Drivers of Corporate investment banker Strain: Low Risk  (02/06/2022)   Overall Financial Resource Strain (CARDIA)    Difficulty of Paying Living Expenses: Not very hard  Food Insecurity: No Food Insecurity (12/22/2022)   Hunger Vital Sign    Worried About Running Out of Food in the Last Year: Never true    Ran Out of Food in the Last Year: Never true  Transportation Needs: No Transportation Needs (12/22/2022)   PRAPARE - Administrator, Civil Service (Medical): No    Lack of Transportation (Non-Medical): No  Physical Activity: Not on file  Stress: Not on file  Social Connections: Unknown (09/15/2021)   Received from [facility]   Social Network    Social Network: Not on file  Intimate Partner Violence: Not At Risk (12/22/2022)   Humiliation, Afraid, Rape, and Kick questionnaire    Fear of Current or Ex-Partner: No    Emotionally Abused: No    Physically Abused: No    Sexually Abused: No    Family History  Problem Relation  Age of Onset   Stroke Mother    Other Father        Deceased, car fell on him   Diabetes Sister    Hypertension Sister    Heart disease Maternal Grandmother        Pacemaker, MI    ROS: no fevers or chills, productive cough, hemoptysis, dysphasia, odynophagia, melena, hematochezia, dysuria, hematuria, rash, seizure activity, orthopnea, PND, pedal edema, claudication. Remaining systems are negative.  Physical Exam: Well-developed well-nourished in no acute distress.  Skin is warm and dry.  HEENT is normal.  Neck is supple.  Chest is clear to auscultation with normal expansion.  Cardiovascular exam is regular rate and rhythm.  Abdominal exam nontender or distended. No masses palpated. Extremities show no edema. neuro grossly intact  A/P  1 chronic combined systolic/diastolic congestive heart failure-patient appears to be reasonably well compensated today.  Continue present dose of Lasix  and spironolactone .  He discontinued his Jardiance  due to expense.  Will look into patient assistance program.  2 nonischemic cardiomyopathy-patient is followed in the CHF clinic.  He is scheduled for Barostim.  There has been discussions about LVAD but apparently he declined you can call and see.  Will continue low-dose ARB and Toprol .  He has a history of orthostasis/falling and will therefore not transition to Entresto .  3 history of hypertension-blood pressure is controlled.  Continue present medications.  4 CRT-P-Per EP.  5 history of coronary calcification-recent catheterization revealed no coronary disease.  Continue statin.  6 hyperlipidemia-continue statin.  7 morbid obesity-we discussed the importance of weight loss.  Alexandria Angel, MD

## 2023-08-31 ENCOUNTER — Telehealth: Payer: Self-pay | Admitting: *Deleted

## 2023-08-31 NOTE — Telephone Encounter (Signed)
 Pt called regarding question with upcoming surgery. When pulling up the chart  I noticed  this was a MD appointment not surgery. Pt was seen on 08/27/23 for Barostim evaluation  which was some how marked at a no show putting the referral back in the que to schedule an appointment. Pt is aware that this surgical produce will have to be approved by insurance prior to scheduling for surgery . I will call back today with updated information on the process of insurance approval .

## 2023-09-03 ENCOUNTER — Encounter: Admitting: Surgery

## 2023-09-04 ENCOUNTER — Other Ambulatory Visit: Payer: Self-pay

## 2023-09-04 DIAGNOSIS — M48062 Spinal stenosis, lumbar region with neurogenic claudication: Secondary | ICD-10-CM | POA: Diagnosis not present

## 2023-09-04 DIAGNOSIS — M5431 Sciatica, right side: Secondary | ICD-10-CM | POA: Diagnosis not present

## 2023-09-04 NOTE — Progress Notes (Addendum)
 Specialty Pharmacy Ongoing Clinical Assessment Note  William Villanueva is a 73 y.o. male who is being followed by the specialty pharmacy service for RxSp Oncology   Patient's specialty medication(s) reviewed today: Ruxolitinib  Phosphate (JAKAFI )   Missed doses in the last 4 weeks: 0   Patient/Caregiver did not have any additional questions or concerns.   Therapeutic benefit summary: Patient is achieving benefit   Adverse events/side effects summary: No adverse events/side effects   Patient's therapy is appropriate to: Continue    Goals Addressed             This Visit's Progress    Stabilization of disease       Patient is not on track and improving. Patient will maintain adherence. Per William Villanueva note from 08/28/23 platelet count is decreasing as expected. Count was 367 down from 950 previously.           Follow up:  3 months  William Villanueva Specialty Pharmacist

## 2023-09-12 ENCOUNTER — Encounter: Payer: Self-pay | Admitting: Cardiology

## 2023-09-12 ENCOUNTER — Ambulatory Visit: Payer: PPO | Attending: Cardiology | Admitting: Cardiology

## 2023-09-12 VITALS — BP 103/63 | HR 72 | Ht 70.0 in | Wt 235.0 lb

## 2023-09-12 DIAGNOSIS — Z95810 Presence of automatic (implantable) cardiac defibrillator: Secondary | ICD-10-CM

## 2023-09-12 DIAGNOSIS — I5042 Chronic combined systolic (congestive) and diastolic (congestive) heart failure: Secondary | ICD-10-CM

## 2023-09-12 DIAGNOSIS — I428 Other cardiomyopathies: Secondary | ICD-10-CM | POA: Diagnosis not present

## 2023-09-12 NOTE — Patient Instructions (Signed)
   Follow-Up: At [facility], you and your health needs are our priority.  As part of our continuing mission to provide you with exceptional heart care, our providers are all part of one team.  This team includes your primary Cardiologist (physician) and Advanced Practice Providers or APPs (Physician Assistants and Nurse Practitioners) who all work together to provide you with the care you need, when you need it.  Your next appointment:   6 month(s)  Provider:   Alexandria Angel, MD    Other Instructions  MEDICARE LOW INCOME [phone]

## 2023-09-14 NOTE — Progress Notes (Signed)
 Remote ICD transmission.

## 2023-09-17 ENCOUNTER — Other Ambulatory Visit: Payer: Self-pay

## 2023-09-17 DIAGNOSIS — I5022 Chronic systolic (congestive) heart failure: Secondary | ICD-10-CM

## 2023-09-18 ENCOUNTER — Encounter (HOSPITAL_COMMUNITY): Payer: Self-pay | Admitting: Cardiology

## 2023-09-18 ENCOUNTER — Ambulatory Visit (HOSPITAL_COMMUNITY)
Admission: RE | Admit: 2023-09-18 | Discharge: 2023-09-18 | Disposition: A | Discharge status: Home / Self Care | Source: Ambulatory Visit | Attending: Cardiology | Admitting: Cardiology

## 2023-09-18 VITALS — BP 124/72 | HR 79 | Ht 70.0 in | Wt 232.0 lb

## 2023-09-18 DIAGNOSIS — Z79899 Other long term (current) drug therapy: Secondary | ICD-10-CM | POA: Diagnosis not present

## 2023-09-18 DIAGNOSIS — I2510 Atherosclerotic heart disease of native coronary artery without angina pectoris: Secondary | ICD-10-CM | POA: Diagnosis not present

## 2023-09-18 DIAGNOSIS — Z7984 Long term (current) use of oral hypoglycemic drugs: Secondary | ICD-10-CM | POA: Diagnosis not present

## 2023-09-18 DIAGNOSIS — Z794 Long term (current) use of insulin: Secondary | ICD-10-CM | POA: Insufficient documentation

## 2023-09-18 DIAGNOSIS — Z8679 Personal history of other diseases of the circulatory system: Secondary | ICD-10-CM | POA: Diagnosis not present

## 2023-09-18 DIAGNOSIS — Z95810 Presence of automatic (implantable) cardiac defibrillator: Secondary | ICD-10-CM | POA: Diagnosis not present

## 2023-09-18 DIAGNOSIS — E1122 Type 2 diabetes mellitus with diabetic chronic kidney disease: Secondary | ICD-10-CM | POA: Diagnosis not present

## 2023-09-18 DIAGNOSIS — N1831 Chronic kidney disease, stage 3a: Secondary | ICD-10-CM | POA: Diagnosis not present

## 2023-09-18 DIAGNOSIS — E785 Hyperlipidemia, unspecified: Secondary | ICD-10-CM | POA: Diagnosis not present

## 2023-09-18 DIAGNOSIS — G4733 Obstructive sleep apnea (adult) (pediatric): Secondary | ICD-10-CM | POA: Diagnosis not present

## 2023-09-18 DIAGNOSIS — I5042 Chronic combined systolic (congestive) and diastolic (congestive) heart failure: Secondary | ICD-10-CM | POA: Diagnosis not present

## 2023-09-18 DIAGNOSIS — I429 Cardiomyopathy, unspecified: Secondary | ICD-10-CM | POA: Diagnosis not present

## 2023-09-18 DIAGNOSIS — I130 Hypertensive heart and chronic kidney disease with heart failure and stage 1 through stage 4 chronic kidney disease, or unspecified chronic kidney disease: Secondary | ICD-10-CM | POA: Insufficient documentation

## 2023-09-18 NOTE — Patient Instructions (Signed)
 Great to see you today!!!  Your physician recommends that you schedule a follow-up appointment in: 3 months (August), **PLEASE CALL OUR OFFICE IN JULY TO SCHEDULE THIS APPOINTMENT  If you have any questions or concerns before your next appointment please send us  a message through [place] or call our office at [phone].    TO LEAVE A MESSAGE FOR THE NURSE SELECT OPTION 2, PLEASE LEAVE A MESSAGE INCLUDING: YOUR NAME DATE OF BIRTH CALL BACK NUMBER REASON FOR CALL**this is important as we prioritize the call backs  YOU WILL RECEIVE A CALL BACK THE SAME DAY AS LONG AS YOU CALL BEFORE 4:00 PM  At the [facility], you and your health needs are our priority. As part of our continuing mission to provide you with exceptional heart care, we have created designated Provider Care Teams. These Care Teams include your primary Cardiologist (physician) and Advanced Practice Providers (APPs- Physician Assistants and Nurse Practitioners) who all work together to provide you with the care you need, when you need it.   You may see any of the following providers on your designated Care Team at your next follow up: Dr Jules Oar Dr Peder Bourdon Dr. Alwin Baars Dr. Arta Lark Amy Marijane Shoulders, NP Ruddy Corral, [place] [facility] [place], [place] Dennise Fitz, NP [place] Lee, NP Shawnee Dellen, NP Luster Salters, PharmD Bevely Brush, PharmD   Please be sure to bring in all your medications bottles to every appointment.    Thank you for choosing [facility]

## 2023-09-19 NOTE — Progress Notes (Signed)
 Surgical Instructions   Your procedure is scheduled on Thursday, May 29th, 2025. Report to [facility] Main Entrance "A" at 7:30 A.M., then check in with the Admitting office. Any questions or running late day of surgery: call [phone]  Questions prior to your surgery date: call [phone], Monday-Friday, 8am-4pm. If you experience any cold or flu symptoms such as cough, fever, chills, shortness of breath, etc. between now and your scheduled surgery, please notify us  at the above number.     Remember:  Do not eat or drink after midnight the night before your surgery     Take these medicines the morning of surgery with A SIP OF WATER: Allopurinol  (Zyloprim ) Budesonide  (Pulmicort ) Nebulizer Dicyclomine  (Bentyl ) Metoprolol  Succinate (Toprol -XL) Omeprazole  (Prilosec) Oxybutynin  (Ditropan  XL) Ruxolitinib  Phosphate (Jakafi ) Tamsulosin  (Flomax )   May take these medicines IF NEEDED: Acetaminophen  (Tylenol )    One week prior to surgery, STOP taking any Aspirin  (unless otherwise instructed by your surgeon) Aleve, Naproxen, Ibuprofen, Motrin, Advil, Goody's, BC's, all herbal medications, fish oil, and non-prescription vitamins.  This includes Diclofenac  Sodium (Voltaren ) gel.    WHAT DO I DO ABOUT MY DIABETES MEDICATION?    THE MORNING OF SURGERY, take _8_ units of Insulin  Degludec Horace Lye) insulin .     HOW TO MANAGE YOUR DIABETES BEFORE AND AFTER SURGERY  Why is it important to control my blood sugar before and after surgery? Improving blood sugar levels before and after surgery helps healing and can limit problems. A way of improving blood sugar control is eating a healthy diet by:  Eating less sugar and carbohydrates  Increasing activity/exercise  Talking with your doctor about reaching your blood sugar goals High blood sugars (greater than 180 mg/dL) can raise your risk of infections and slow your recovery, so you will need to focus on controlling your diabetes  during the weeks before surgery. Make sure that the doctor who takes care of your diabetes knows about your planned surgery including the date and location.  How do I manage my blood sugar before surgery? Check your blood sugar at least 4 times a day, starting 2 days before surgery, to make sure that the level is not too high or low.  Check your blood sugar the morning of your surgery when you wake up and every 2 hours until you get to the Short Stay unit.  If your blood sugar is less than 70 mg/dL, you will need to treat for low blood sugar: Do not take insulin . Treat a low blood sugar (less than 70 mg/dL) with  cup of clear juice (cranberry or apple), 4 glucose tablets, OR glucose gel. Recheck blood sugar in 15 minutes after treatment (to make sure it is greater than 70 mg/dL). If your blood sugar is not greater than 70 mg/dL on recheck, call [phone] for further instructions. Report your blood sugar to the short stay nurse when you get to Short Stay.  If you are admitted to the hospital after surgery: Your blood sugar will be checked by the staff and you will probably be given insulin  after surgery (instead of oral diabetes medicines) to make sure you have good blood sugar levels. The goal for blood sugar control after surgery is 80-180 mg/dL.                      Do NOT Smoke (Tobacco/Vaping) for 24 hours prior to your procedure.  If you use a CPAP at night, you may bring your mask/headgear for your overnight stay.  You will be asked to remove any contacts, glasses, piercing's, hearing aid's, dentures/partials prior to surgery. Please bring cases for these items if needed.    Patients discharged the day of surgery will not be allowed to drive home, and someone needs to stay with them for 24 hours.  SURGICAL WAITING ROOM VISITATION Patients may have no more than 2 support people in the waiting area - these visitors may rotate.   Pre-op nurse will coordinate an appropriate time  for 1 ADULT support person, who may not rotate, to accompany patient in pre-op.  Children under the age of 63 must have an adult with them who is not the patient and must remain in the main waiting area with an adult.  If the patient needs to stay at the hospital during part of their recovery, the visitor guidelines for inpatient rooms apply.  Please refer to the [facility] website for the visitor guidelines for any additional information.   If you received a COVID test during your pre-op visit  it is requested that you wear a mask when out in public, stay away from anyone that may not be feeling well and notify your surgeon if you develop symptoms. If you have been in contact with anyone that has tested positive in the last 10 days please notify you surgeon.      Pre-operative CHG Bathing Instructions   You can play a key role in reducing the risk of infection after surgery. Your skin needs to be as free of germs as possible. You can reduce the number of germs on your skin by washing with CHG (chlorhexidine  gluconate) soap before surgery. CHG is an antiseptic soap that kills germs and continues to kill germs even after washing.   DO NOT use if you have an allergy to chlorhexidine /CHG or antibacterial soaps. If your skin becomes reddened or irritated, stop using the CHG and notify one of our RNs at [phone].              TAKE A SHOWER THE NIGHT BEFORE SURGERY AND THE DAY OF SURGERY    Please keep in mind the following:  DO NOT shave, including legs and underarms, 48 hours prior to surgery.   You may shave your face before/day of surgery.  Place clean sheets on your bed the night before surgery Use a clean washcloth (not used since being washed) for each shower. DO NOT sleep with pet's night before surgery.  CHG Shower Instructions:  Wash your face and private area with normal soap. If you choose to wash your hair, wash first with your normal shampoo.  After you use shampoo/soap,  rinse your hair and body thoroughly to remove shampoo/soap residue.  Turn the water OFF and apply half the bottle of CHG soap to a CLEAN washcloth.  Apply CHG soap ONLY FROM YOUR NECK DOWN TO YOUR TOES (washing for 3-5 minutes)  DO NOT use CHG soap on face, private areas, open wounds, or sores.  Pay special attention to the area where your surgery is being performed.  If you are having back surgery, having someone wash your back for you may be helpful. Wait 2 minutes after CHG soap is applied, then you may rinse off the CHG soap.  Pat dry with a clean towel  Put on clean pajamas    Additional instructions for the day of surgery: DO NOT APPLY any lotions, deodorants, cologne, or perfumes.   Do not wear jewelry or makeup Do not wear nail  polish, gel polish, artificial nails, or any other type of covering on natural nails (fingers and toes) Do not bring valuables to the hospital. [facility] is not responsible for valuables/personal belongings. Put on clean/comfortable clothes.  Please brush your teeth.  Ask your nurse before applying any prescription medications to the skin.

## 2023-09-20 ENCOUNTER — Other Ambulatory Visit: Payer: Self-pay

## 2023-09-20 ENCOUNTER — Encounter: Payer: Self-pay | Admitting: Surgery

## 2023-09-20 ENCOUNTER — Encounter (HOSPITAL_COMMUNITY): Payer: Self-pay

## 2023-09-20 ENCOUNTER — Encounter (HOSPITAL_COMMUNITY)
Admission: RE | Admit: 2023-09-20 | Discharge: 2023-09-20 | Disposition: A | Discharge status: Home / Self Care | Source: Ambulatory Visit | Attending: Surgery | Admitting: Surgery

## 2023-09-20 VITALS — BP 116/61 | HR 85 | Temp 97.7°F | Resp 19 | Ht 70.0 in | Wt 238.4 lb

## 2023-09-20 DIAGNOSIS — Z87891 Personal history of nicotine dependence: Secondary | ICD-10-CM | POA: Diagnosis not present

## 2023-09-20 DIAGNOSIS — Z01818 Encounter for other preprocedural examination: Secondary | ICD-10-CM

## 2023-09-20 DIAGNOSIS — I428 Other cardiomyopathies: Secondary | ICD-10-CM | POA: Diagnosis not present

## 2023-09-20 DIAGNOSIS — G4733 Obstructive sleep apnea (adult) (pediatric): Secondary | ICD-10-CM | POA: Insufficient documentation

## 2023-09-20 DIAGNOSIS — Z9889 Other specified postprocedural states: Secondary | ICD-10-CM | POA: Diagnosis not present

## 2023-09-20 DIAGNOSIS — Z95810 Presence of automatic (implantable) cardiac defibrillator: Secondary | ICD-10-CM | POA: Diagnosis not present

## 2023-09-20 DIAGNOSIS — Z79899 Other long term (current) drug therapy: Secondary | ICD-10-CM | POA: Insufficient documentation

## 2023-09-20 DIAGNOSIS — Z01812 Encounter for preprocedural laboratory examination: Secondary | ICD-10-CM | POA: Insufficient documentation

## 2023-09-20 DIAGNOSIS — I110 Hypertensive heart disease with heart failure: Secondary | ICD-10-CM | POA: Diagnosis not present

## 2023-09-20 DIAGNOSIS — I5022 Chronic systolic (congestive) heart failure: Secondary | ICD-10-CM | POA: Diagnosis not present

## 2023-09-20 DIAGNOSIS — D45 Polycythemia vera: Secondary | ICD-10-CM | POA: Insufficient documentation

## 2023-09-20 HISTORY — DX: Presence of automatic (implantable) cardiac defibrillator: Z95.810

## 2023-09-20 HISTORY — DX: Presence of cardiac pacemaker: Z95.0

## 2023-09-20 HISTORY — DX: Unspecified osteoarthritis, unspecified site: M19.90

## 2023-09-20 HISTORY — DX: Personal history of urinary calculi: Z87.442

## 2023-09-20 LAB — URINALYSIS, ROUTINE W REFLEX MICROSCOPIC
Bilirubin Urine: NEGATIVE
Glucose, UA: NEGATIVE mg/dL
Hgb urine dipstick: NEGATIVE
Ketones, ur: NEGATIVE mg/dL
Leukocytes,Ua: NEGATIVE
Nitrite: NEGATIVE
Protein, ur: NEGATIVE mg/dL
Specific Gravity, Urine: 1.012 (ref 1.005–1.030)
pH: 5 (ref 5.0–8.0)

## 2023-09-20 LAB — COMPREHENSIVE METABOLIC PANEL WITH GFR
ALT: 12 U/L (ref 0–44)
AST: 20 U/L (ref 15–41)
Albumin: 3.7 g/dL (ref 3.5–5.0)
Alkaline Phosphatase: 68 U/L (ref 38–126)
Anion gap: 11 (ref 5–15)
BUN: 32 mg/dL — ABNORMAL HIGH (ref 8–23)
CO2: 19 mmol/L — ABNORMAL LOW (ref 22–32)
Calcium: 9.2 mg/dL (ref 8.9–10.3)
Chloride: 107 mmol/L (ref 98–111)
Creatinine, Ser: 1.49 mg/dL — ABNORMAL HIGH (ref 0.61–1.24)
GFR, Estimated: 50 mL/min — ABNORMAL LOW (ref >60)
Glucose, Bld: 160 mg/dL — ABNORMAL HIGH (ref 70–99)
Potassium: 3.9 mmol/L (ref 3.5–5.1)
Sodium: 137 mmol/L (ref 135–145)
Total Bilirubin: 0.6 mg/dL (ref 0.0–1.2)
Total Protein: 7.7 g/dL (ref 6.5–8.1)

## 2023-09-20 LAB — CBC
HCT: 34.9 % — ABNORMAL LOW (ref 39.0–52.0)
Hemoglobin: 11.1 g/dL — ABNORMAL LOW (ref 13.0–17.0)
MCH: 33.9 pg (ref 26.0–34.0)
MCHC: 31.8 g/dL (ref 30.0–36.0)
MCV: 106.7 fL — ABNORMAL HIGH (ref 80.0–100.0)
Platelets: 539 10*3/uL — ABNORMAL HIGH (ref 150–400)
RBC: 3.27 MIL/uL — ABNORMAL LOW (ref 4.22–5.81)
RDW: 18.6 % — ABNORMAL HIGH (ref 11.5–15.5)
WBC: 10.4 10*3/uL (ref 4.0–10.5)
nRBC: 0 % (ref 0.0–0.2)

## 2023-09-20 LAB — TYPE AND SCREEN
ABO/RH(D): A POS
Antibody Screen: NEGATIVE

## 2023-09-20 LAB — GLUCOSE, CAPILLARY: Glucose-Capillary: 192 mg/dL — ABNORMAL HIGH (ref 70–99)

## 2023-09-20 LAB — SURGICAL PCR SCREEN
MRSA, PCR: NEGATIVE
Staphylococcus aureus: NEGATIVE

## 2023-09-20 LAB — APTT: aPTT: 36 s (ref 24–36)

## 2023-09-20 NOTE — Progress Notes (Signed)
 PCP - Drema Genta Cardiologist - [facility] - Shelba Desanctis  PPM/ICD - St. Jude ICD Device Orders - not applicable for procedure Rep Notified - by [facility]- see surgical posting  Chest x-ray - 01/08/23 EKG - 06/19/23 Stress Test - 07/2015 ECHO - 12/22/2022 Cardiac Cath - 01/10/2023  Sleep Study - +sleep apnea but could not tolerate CPAP CPAP -   Fasting Blood Sugar - na Checks Blood Sugar _____ times a day  Last dose of GLP1 agonist-  na GLP1 instructions:   Blood Thinner Instructions:na Aspirin  Instructions:na  ERAS Protcol - no PRE-SURGERY Ensure or G2-   COVID TEST- na   Anesthesia review: yes-ICD,polycythemia vera  Patient denies shortness of breath, fever, cough and chest pain at PAT appointment   All instructions explained to the patient, with a verbal understanding of the material. Patient agrees to go over the instructions while at home for a better understanding. The opportunity to ask questions was provided.

## 2023-09-21 NOTE — Progress Notes (Signed)
 ADVANCED HEART FAILURE FOLLOW UP CLINIC NOTE  Referring Physician: Rae Bugler, MD  Primary Care: Rae Bugler, MD Primary Cardiologist:  HPI: William Villanueva is a 73 y.o. male with a PMH of PMH of nonischemic chronic systolic heart failure, heart block s/p CRT-D, diabetes, CKD, HTN, HLD, OSA who presents for follow up of chronic systolic heart failure.      Evaluated by Dr. Audery Blazing in August 2013 with complaints of chest pain. Echo showed EF 35 to 40%. Cardiac catheterization September 2013 showed mild nonobstructive CAD with no hemodynamic evidence of restriction. Cardiac MRI September 2013 showed EF 34% with diffuse hypokinesis, no hyperenhancement or scar tissue and no evidence of cardiac hemochromatosis.   Patient was admitted December 2015 with high degree AV block. Subsequently had CRT-P placed. In the last year has had an increase in the number of admissions for volume overload and it appears a precipitous decline in his functional status.     SUBJECTIVE:  Symptoms overall stable.  We have discussed LVAD in the past and he is not currently interested.  He has seen vascular surgery and plans to proceed with Barostim. He denies any worsening orthopnea or leg swelling.  Chief complaint continues to be shortness of breath with exertion and fatigue.  PMH, current medications, allergies, social history, and family history reviewed in epic.  PHYSICAL EXAM: Vitals:   09/18/23 1344  BP: 124/72  Pulse: 79  SpO2: 95%   GENERAL: Well nourished and in no apparent distress at rest.  PULM:  Normal work of breathing, clear to auscultation bilaterally. Respirations are unlabored.  CARDIAC:  JVP: mildly elevated         Normal rate with regular rhythm. No murmurs, rubs or gallops.  Trace edema. Warm and well perfused extremities. ABDOMEN: Soft, non-tender, non-distended. NEUROLOGIC: Patient is oriented x3 with no focal or lateralizing neurologic deficits.    DATA  REVIEW  ECG: BiV paced     ECHO: 01/2022:EF 40%, Grade II DD, mild RVH, severe LAE, mild RAE, mild MR  12/2022: Severely reduced EF, less than 20%, global hypokinesis with severe dilation, normal RV size, function moderately reduced   CATH: R/LHC 01/10/2023: Angiographically normal coronary arteries with large caliber PDA and PL system and large OM branches and diagonal branches but diminutive LAD after D3. RHC with RAP 20 mmHg, RVP having EDP 49/19-22 mmHg; PAP-mean 48/36-38 mmHg, PCWP average 30 to 34 mmHg, TD CO/CI 5.35/2.4  RHC 4/25: RA 6, PA 48/21 (35), PCWP 20, TD CO/CI 4.71/2.13, PVR 3 Wood units, PAPi 4.5  Heart failure review: - Classification: Heart failure with reduced EF - Etiology: Work up ongoing - NYHA Class: IIIb-IV - Volume status: Euvolemic - ACEi/ARB/ARNI: Maximally tolerated dose - Aldosterone antagonist: Maximally tolerated dose - Beta-blocker: Maximally tolerated dose - Digoxin: Plan to start at a subsequent visit - Hydralazine /Nitrates: Not indicated - SGLT2i: Maximally tolerated dose - GLP-1: Consider in future - Advanced therapies: Actively working up - ICD: Already in place  ASSESSMENT & PLAN:  Chronic systolic heart failure: Patient with borderline ACC/AHA stage D cardiomyopathy with NYHA class IIIb symptoms. RHC reviewed with patient, borderline index and he is not interested in LVAD at this time, suspect borderline candidate with his deconditioning.   - Amyloid workup negative - RHC reviewed - Continue torsemide  40mg  daily with additional dose prn - Continue jardiance  10mg  daily - Continue spironolactone  12.5mg  daily given prior hyperK - Continue metoprolol  succinate 12.5mg  daily - Barostim scheduled, BNP ordered - Considered for  COMET, if he is admitted would attempt enrollment   CKD stage IIIa: Stable disease currently. - Last creatinine 1.49 on 5/15, remains stable.   CHB: CRT-D in place, follows with EP   HTN: Management as above, low blood  pressure limits further titration of medical therapy   HLD: - Continue statin   Diabetes:  - Currently on insulin , follows with PCP, last A1c 5.6   Follow up in 3 months after Barostim  Arta Lark, MD Advanced Heart Failure Mechanical Circulatory Support 09/21/23

## 2023-09-21 NOTE — Progress Notes (Signed)
 Anesthesia Chart Review:  Case: [id number] Date/Time: 10/04/23 0919   Procedure: INSERTION, CAROTID SINUS BAROREFLEX ACTIVATION DEVICE (Right)   Anesthesia type: General   Diagnosis: Chronic systolic heart failure (HCC) [I50.22]   Pre-op diagnosis: CHF   [facility] ROOM [facility] / [facility]   Surgeons: Margherita Shell, MD       DISCUSSION: Patient is a 73 year old male scheduled for the above procedure.  History includes former smoker (quit 04/05/13), polycythemia vera (JAK2 positive; ), hemochromatosis, HTN, OSA (intolerant to CPAP), nonischemic cardiomyopathy (diagnosed 2013), chronic combined systolic and diastolic CHF, implantable cardiac device ( (CRT-P PPM 04/29/14 for high degree heart block; upgrade to [facility] CRT-D 04/28/22 due to VT with syncope), spinal surgery.  Last visit with HF cardiologist Dr. Alease Amend was on 09/18/23. He has "borderline ACC/AHA stage D cardiomyopathy with NYHA class IIIb symptoms. RHC reviewed with patient, borderline index and he is not interested in LVAD at this time, suspect borderline candidate with his deconditioning." Amyloid workup negative. Overall symptoms were stable with primary symptoms of DOE and fatigue. He planned to proceed with Barostim. Last visit with Dr. Audery Blazing 09/12/23, and Dr. Rodolfo Clan 04/17/23.   He is followed by hematologist Dr. Maria Shiner for Polycythemia vera-JAK2 positive. He had been on hydrea  but discontinued 08/03/23 and changed to Jakafi  08/07/23 (PLT 950K 08/01/23). Also on ASA 81 mg daily and as needed phlebotomy to maintain hematocrit less than 45%.  He had Feraheme on 07/19/2023 for feeling poorly.  Last visit 08/28/23. PLT count had improved to 367K. He noted patient being evaluated for vascular procedure. One month follow-up planned. PLT count 539K, H/H 11.1/34.9 on 09/20/23.   [facility] notified [employer].   Anesthesia team to evaluate on the day of surgery.   VS: BP 116/61   Pulse 85   Temp 36.5 C   Resp 19   Ht 5\' 10"  (1.778 m)    Wt 108.1 kg   SpO2 96%   BMI 34.21 kg/m   PROVIDERS: Rae Bugler, MD is PCP  Alexandria Angel, MD is cardiologist Arta Lark, MD is HF cardiologist Richardo Chandler, MD is EP cardiologist Gray Layman, MD is Mat Solian, MD is rheumatologist (for gout and OA) Vernestine Gondola, MD is pulmonologist (for dyspnea, asthmatic bronchitis; previously use of home O2, discontinued 05/2022)   LABS: Preoperative labs noted. Cr 1.49, previously 1.2-1.48 since 06/2023.  (all labs ordered are listed, but only abnormal results are displayed)  Labs Reviewed  GLUCOSE, CAPILLARY - Abnormal; Notable for the following components:      Result Value   Glucose-Capillary 192 (*)    All other components within normal limits  CBC - Abnormal; Notable for the following components:   RBC 3.27 (*)    Hemoglobin 11.1 (*)    HCT 34.9 (*)    MCV 106.7 (*)    RDW 18.6 (*)    Platelets 539 (*)    All other components within normal limits  COMPREHENSIVE METABOLIC PANEL WITH GFR - Abnormal; Notable for the following components:   CO2 19 (*)    Glucose, Bld 160 (*)    BUN 32 (*)    Creatinine, Ser 1.49 (*)    GFR, Estimated 50 (*)    All other components within normal limits  SURGICAL PCR SCREEN  APTT  URINALYSIS, ROUTINE W REFLEX MICROSCOPIC  TYPE AND SCREEN    OTHER: "ONO RA  12/20/21  desat < 89% x 5h 49 min  So 12/30/2021 rec 2lpm  and repeat ono on 2lpm  - pt stopped all 02 mid dec 2023 - Patient walked at moderate pace with a cane. Pt was able to complete 500 ft Pt states very fatigued and fast breathing. Oxygen  sats at 96% at end"   IMAGES: CXR 01/08/23: FINDINGS: - Mild hyperinflation. Pacer/ICD. Midline trachea. Mild cardiomegaly. Trace bilateral pleural fluid or thickening. Pulmonary venous congestion is slightly increased. No lobar consolidation. Remote lower left rib fractures. IMPRESSION: - Cardiomegaly with slight increase in mild pulmonary venous congestion. - Probable  trace pleural fluid or thickening bilaterally.   EKG: 06/19/23: Atrial-sensed ventricular-paced rhythm Abnormal ECG When compared with ECG of 17-Apr-2023 13:48, Vent. rate has decreased BY 14 BPM Confirmed by End, Christopher [phone]) on 06/19/2023 9:05:02 PM   CV: US  Carotid 08/24/23: Summary:  - Right Carotid: Velocities in the right ICA are consistent with a 1-39% stenosis.  - Left Carotid: Velocities in the left ICA are consistent with a 1-39%  stenosis.  - Vertebrals: Bilateral vertebral arteries demonstrate antegrade flow.  - Subclavians: Normal flow hemodynamics were seen in bilateral subclavian arteries.    RHC 08/09/23: HEMODYNAMICS: RA:       6 mmHg (mean) RV:       43/4, 6 mmHg PA:       48/21 mmHg (35 mean) PCWP: 20 mmHg (mean)                                      Estimated Fick CO/CI   5.02L/min, 2.27L/min/m2       Thermodilution CO/CI   4.71L/min, 2.13L/min/m2                                      TPG  15  mmHg                                               PVR  3 Wood Units  PAPi  4.5      IMPRESSION: RHC for worsening heart failure symptoms. Normal right sided, mildly elevated left sided filling pressures. Borderline cardiac output by thermodilution Moderate group II pulmonary hypertension, preserved PAPi   RECOMMENDATIONS: Discussion with patient about LVAD, would like to discuss Barostim. Referral to be placed.   Myocardial Amyloid Planar & SPECT 07/05/23:   Myocardial uptake was negative for radiotracer uptake. The visual grade of myocardial uptake relative to the ribs was Grade 0 (No myocardial uptake and normal bone uptake).   Findings are not suggestive (Grade 0) of cardiac ATTR amyloidosis.   Prior study not available for comparison.   RHC/LHC 01/10/23: POST-CATH DIAGNOSES Nonischemic cardiomyopathy with angiographically normal coronary arteries with large caliber PDA and PL system and large OM branches and diagonal branches but diminutive LAD after  D3. Moderate Pulmonary Hypertension due to venous congestion from LV failure: Hemodynamics: RAP 20 mmHg, RVP having EDP 49/19-22 mmHg; PAP-mean 48/36-38 mmHg, PCWP average 30 to 34 mmHg; LV P-EDP 120/20 1-31,000,000 recreate, AOP-MAP 123/76-96 mmHg  Ao sat 87%, PA sat 51% -> Fick cardiac output-index 6.25-2.80; TD 5.35-2.4    RECOMMENDATIONS WITH recent ER visit for heart failure and severely elevated filling pressures symptoms are consistent with acute on chronic combined systolic and diastolic heart  failure and I think the best method of treatment is admission for IV diuresis and titration of heart failure medications..   Echo 12/22/22: IMPRESSIONS   1. LV systolic function is severely depressed with diffuse hypokinesis;  inferior, septal and apical akinesis. COmpared to echo from Sept 2023,  LVEF is worse . Left ventricular ejection fraction, by estimation, is  <20%. The left ventricle has severely  decreased function. The left ventricle demonstrates global hypokinesis.  The left ventricular internal cavity size was severely dilated.   2. Right ventricular systolic function is severely reduced. The right  ventricular size is normal. There is moderately elevated pulmonary artery  systolic pressure.   3. Left atrial size was moderately dilated.   4. Right atrial size was moderately dilated.   5. Trivial mitral valve regurgitation.   6. The aortic valve is normal in structure. Aortic valve regurgitation is  not visualized.   7. The inferior vena cava is dilated in size with <50% respiratory  variability, suggesting right atrial pressure of 15 mmHg.  - Comparison 02/04/22 EF 40%, LV global hypokinesis, Grade II DD, mild RVH, severe LAE, mild RAE, mild MR    Past Medical History:  Diagnosis Date   AICD (automatic cardioverter/defibrillator) present    Arthritis    AV block, 2nd degree 2015   [employer] Jude Allure Quadra pulse generator [id number], model PM 3242   Back pain    Cardiomyopathy  (HCC)    Nonischemic 45%.    CHF (congestive heart failure) (HCC)    Diabetes mellitus without complication (HCC)    Fracture of lumbar vertebra without spinal cord injury, sequela 10/09/2022   Gout    Hemochromatosis    History of kidney stones    Hypertension    Hypospadias 07/04/1950   born with   Nephrolithiasis    Pacemaker lead malfunction-elevated threshold RV lead 07/31/2014   Polycythemia vera(238.4)    Presence of permanent cardiac pacemaker     Past Surgical History:  Procedure Laterality Date   ANKLE SURGERY     BI-VENTRICULAR PACEMAKER INSERTION N/A 04/29/2014   Procedure: BI-VENTRICULAR PACEMAKER INSERTION (CRT-P);  Surgeon: Verona Goodwill, MD; Laterality: Left  [employer] Jude Allure Quadra pulse generator [id number], model [place] 1478   CARDIAC SURGERY     COLONOSCOPY N/A 02/15/2020   Procedure: COLONOSCOPY;  Surgeon: Genell Ken, MD;  Location: [facility] ENDOSCOPY;  Service: Gastroenterology;  Laterality: N/A;   ESOPHAGOGASTRODUODENOSCOPY (EGD) WITH PROPOFOL  N/A 02/15/2020   Procedure: ESOPHAGOGASTRODUODENOSCOPY (EGD) WITH PROPOFOL ;  Surgeon: Genell Ken, MD;  Location: [facility] ENDOSCOPY;  Service: Gastroenterology;  Laterality: N/A;   LEAD INSERTION N/A 04/28/2022   Procedure: LEAD INSERTION;  Surgeon: Verona Goodwill, MD;  Location: [facility] INVASIVE CV LAB;  Service: Cardiovascular;  Laterality: N/A;   PILONIDAL CYST EXCISION     POSTERIOR LAMINECTOMY / DECOMPRESSION LUMBAR SPINE     PPM GENERATOR CHANGEOUT N/A 04/28/2022   Procedure: PPM GENERATOR CHANGEOUT;  Surgeon: Verona Goodwill, MD;  Location: [facility] INVASIVE CV LAB;  Service: Cardiovascular;  Laterality: N/A;   RIGHT HEART CATH N/A 08/09/2023   Procedure: RIGHT HEART CATH;  Surgeon: Lauralee Poll, MD;  Location: [facility] INVASIVE CV LAB;  Service: Cardiovascular;  Laterality: N/A;   RIGHT/LEFT HEART CATH AND CORONARY ANGIOGRAPHY N/A 01/10/2023   Procedure: RIGHT/LEFT HEART CATH AND CORONARY ANGIOGRAPHY;  Surgeon: Arleen Lacer, MD;   Location: [facility] INVASIVE CV LAB;  Service: Cardiovascular;  Laterality: N/A;   TONSILLECTOMY      MEDICATIONS:  acetaminophen  (  TYLENOL ) 500 MG tablet   allopurinol  (ZYLOPRIM ) 300 MG tablet   Ascorbic Acid (VITAMIN C PO)   budesonide  (PULMICORT ) 0.5 MG/2ML nebulizer solution   cholestyramine  (QUESTRAN ) 4 GM/DOSE powder   clotrimazole (LOTRIMIN) 1 % cream   Cyanocobalamin  (B-12 PO)   diclofenac  Sodium (VOLTAREN ) 1 % GEL   dicyclomine  (BENTYL ) 20 MG tablet   insulin  degludec (TRESIBA FLEXTOUCH) 100 UNIT/ML SOPN FlexTouch Pen   losartan  (COZAAR ) 25 MG tablet   magnesium  gluconate (MAGONATE) 500 MG tablet   metoprolol  succinate (TOPROL  XL) 25 MG 24 hr tablet   omeprazole  (PRILOSEC) 20 MG capsule   oxybutynin  (DITROPAN  XL) 15 MG 24 hr tablet   ruxolitinib  phosphate (JAKAFI ) 10 MG tablet   sodium bicarbonate  650 MG tablet   spironolactone  (ALDACTONE ) 25 MG tablet   tamsulosin  (FLOMAX ) 0.4 MG CAPS capsule   torsemide  (DEMADEX ) 20 MG tablet   No current facility-administered medications for this encounter.    Ella Gun, PA-C Surgical Short Stay/Anesthesiology [facility] Phone [phone] [facility] Phone [phone] 09/21/2023 5:32 PM

## 2023-09-21 NOTE — Anesthesia Preprocedure Evaluation (Addendum)
 Anesthesia Evaluation  Patient identified by MRN, date of birth, ID band Patient awake    Reviewed: Allergy & Precautions, NPO status , Patient's Chart, lab work & pertinent test results  Airway Mallampati: II  TM Distance: >3 FB Neck ROM: Full    Dental  (+) Dental Advisory Given   Pulmonary sleep apnea , COPD, former smoker   breath sounds clear to auscultation       Cardiovascular hypertension, Pt. on medications and Pt. on home beta blockers +CHF  + dysrhythmias + pacemaker + Cardiac Defibrillator  Rhythm:Regular Rate:Normal     Neuro/Psych   Anxiety Depression     Neuromuscular disease    GI/Hepatic Neg liver ROS, PUD,GERD  ,,  Endo/Other  diabetes, Type 2    Renal/GU Renal disease     Musculoskeletal  (+) Arthritis ,    Abdominal   Peds  Hematology negative hematology ROS (+)   Anesthesia Other Findings   Reproductive/Obstetrics                             Anesthesia Physical Anesthesia Plan  ASA: 4  Anesthesia Plan: General   Post-op Pain Management: Tylenol  PO (pre-op)*   Induction: Intravenous  PONV Risk Score and Plan: 2 and Dexamethasone , Ondansetron  and Treatment may vary due to age or medical condition  Airway Management Planned: Oral ETT  Additional Equipment: Arterial line  Intra-op Plan:   Post-operative Plan: Extubation in OR  Informed Consent: I have reviewed the patients History and Physical, chart, labs and discussed the procedure including the risks, benefits and alternatives for the proposed anesthesia with the patient or authorized representative who has indicated his/her understanding and acceptance.     Dental advisory given  Plan Discussed with:   Anesthesia Plan Comments: (PLAN: 2IV's, Aline, Remi gtt. GETA  RHC 08/09/23: HEMODYNAMICS: RA:       6 mmHg (mean) RV:       43/4, 6 mmHg PA:       48/21 mmHg (35 mean) PCWP: 20 mmHg (mean)                                       Estimated Fick CO/CI   5.02L/min, 2.27L/min/m2       Thermodilution CO/CI   4.71L/min, 2.13L/min/m2                                      TPG  15  mmHg                                               PVR  3 Wood Units  PAPi  4.5    IMPRESSION: 1. RHC for worsening heart failure symptoms. 2. Normal right sided, mildly elevated left sided filling pressures. 3. Borderline cardiac output by thermodilution 4. Moderate group II pulmonary hypertension, preserved PAPi  RECOMMENDATIONS: 1. Discussion with patient about LVAD, would like to discuss Barostim. Referral to be placed.   RHC/LHC 01/10/23: POST-CATH DIAGNOSES  Nonischemic cardiomyopathy with angiographically normal coronary arteries with large caliber PDA and PL system and large OM branches and diagonal branches but diminutive LAD after D3.  Moderate Pulmonary  Hypertension due to venous congestion from LV failure:  o Hemodynamics: RAP 20 mmHg, RVP having EDP 49/19-22 mmHg; PAP-mean 48/36-38 mmHg, PCWP average 30 to 34 mmHg; LV P-EDP 120/20 1-31,000,000 recreate, AOP-MAP 123/76-96 mmHg   o Ao sat 87%, PA sat 51% -> Fick cardiac output-index 6.25-2.80; TD 5.35-2.4    Echo 12/22/22: IMPRESSIONS  1. LV systolic function is severely depressed with diffuse hypokinesis;  inferior, septal and apical akinesis. COmpared to echo from Sept 2023,  LVEF is worse . Left ventricular ejection fraction, by estimation, is  <20%. The left ventricle has severely  decreased function. The left ventricle demonstrates global hypokinesis.  The left ventricular internal cavity size was severely dilated.  2. Right ventricular systolic function is severely reduced. The right  ventricular size is normal. There is moderately elevated pulmonary artery  systolic pressure.  3. Left atrial size was moderately dilated.  4. Right atrial size was moderately dilated.  5. Trivial mitral valve regurgitation.  6. The aortic valve  is normal in structure. Aortic valve regurgitation is  not visualized.  7. The inferior vena cava is dilated in size with <50% respiratory  variability, suggesting right atrial pressure of 15 mmHg.  )       Anesthesia Quick Evaluation

## 2023-09-24 ENCOUNTER — Ambulatory Visit: Attending: Cardiovascular Disease

## 2023-09-24 ENCOUNTER — Other Ambulatory Visit: Payer: Self-pay

## 2023-09-24 DIAGNOSIS — I5042 Chronic combined systolic (congestive) and diastolic (congestive) heart failure: Secondary | ICD-10-CM | POA: Diagnosis not present

## 2023-09-24 DIAGNOSIS — Z95810 Presence of automatic (implantable) cardiac defibrillator: Secondary | ICD-10-CM

## 2023-09-25 ENCOUNTER — Telehealth: Payer: Self-pay

## 2023-09-25 NOTE — Progress Notes (Signed)
 EPIC Encounter for ICM Monitoring  Patient Name: William Villanueva is a 73 y.o. male Date: 09/25/2023 Primary Care Physican: Rae Bugler, MD Primary Cardiologist: Crenshaw/Stoner  Electrophysiologist: Mealor Bi-V Pacing: 97%            07/17/2022 Weight:  217 lbs 09/20/2022 Weight: 227 lbs 10/24/2022 Weight: 228 lbs 01/04/2023 Weight: 234 lbs 06/12/2023 Weight: 227 lbs 06/18/2023 Weight: 230 lbs 07/03/2023 Weight: 227-230 lbs 07/16/2023 Weight: 231 lbs 08/22/2023 Weight: 231 lbs 09/18/2023 Office Weight: 232 lbs    AT/AF Burden <1%                                                      Attempted call to patient and unable to reach.  Left detailed message per DPR regarding transmission.  Transmission results reviewed.    Diet:  Does not salt food but not monitoring salt content of foods he eats.   He drinks < 64 oz fluid daily                        CorVue thoracic daily impedance suggesting normal fluid levels with the exception of possible fluid accumulation from 4/25-5/9.   Prescribed:   Torsemide  20 mg Take 2 tablet(s) (40 mg total) by mouth twice a day.     Spironolactone  25 mg take 0.5 tablet (12.5 mg total) once a day   Labs: 09/20/2023 Creatinine 1.49, BUN 32, Potassium 3.9, Sodium 137 08/28/2023 Creatinine 1.38, BUN 35, Potassium 5.0, Sodium 137, GFR 54, BNP 6,803.0 08/09/2023 Creatinine NA,   BUN NA, Potassium 4.3, Sodium 139, GFR N/A  08/01/2023 Creatinine 1.20, BUN 34, Potassium 4.6, Sodium 140, GFR >60  07/18/2023 Creatinine 1.48, BUN 33, Potassium 5.1, Sodium 138  07/04/2023 Creatinine 1.43, BUN 28, Potassium 4.9, Sodium 138, GFR 52 07/02/2023 Creatinine 1.38, BUN 25, Potassium 4.1, Sodium 137, GFR 54 05/11/2023 Creatinine 1.28, BUN 29, Potassium 4.8, Sodium 141, GFR <60 03/05/2023 Creatinine 1.64, BUN 37, Potassium 4.5, Sodium 138  03/02/2023 Creatinine 1.48, BUN 29, Potassium 5.9, Sodium 140, GFR 50 (potassium addressed in ED) 02/26/2023 Creatinine 1.49, BUN 32,  Potassium 5.5, Sodium 140, GFR 50  02/12/2023 Creatinine 1.64, BUN 47, Potassium 5.1, Sodium 137, GFR 44 A complete set of results can be found in Results Review.   Recommendations:  Left voice mail with ICM number and encouraged to call if experiencing any fluid symptoms.   Follow-up plan: ICM clinic phone appointment on 11/26/2023.   91 day device clinic remote transmission 10/30/2023.     EP/Cardiology Office Visits:   Recall 12/17/2023 with Dr Alease Amend, HF clinic.  Recall 03/10/2024 with Dr Audery Blazing.   Recall 04/11/2024 with Dr Rodolfo Clan.   Copy of ICM check sent to Dr. Arlester Ladd.   3 month ICM trend: 09/24/2023.    12-14 Month ICM trend:     Almyra Jain, RN 09/25/2023 3:13 PM

## 2023-09-25 NOTE — Telephone Encounter (Signed)
 Remote ICM transmission received.  Attempted call to patient regarding ICM remote transmission and left detailed message per DPR.  Left ICM phone number and advised to return call for any fluid symptoms or questions. Next ICM remote transmission scheduled 11/26/2023.

## 2023-09-26 ENCOUNTER — Inpatient Hospital Stay (HOSPITAL_BASED_OUTPATIENT_CLINIC_OR_DEPARTMENT_OTHER): Admitting: Hematology & Oncology

## 2023-09-26 ENCOUNTER — Other Ambulatory Visit: Payer: Self-pay

## 2023-09-26 ENCOUNTER — Inpatient Hospital Stay: Attending: Hematology & Oncology

## 2023-09-26 ENCOUNTER — Encounter: Payer: Self-pay | Admitting: Hematology & Oncology

## 2023-09-26 VITALS — BP 104/44 | HR 79 | Temp 98.0°F | Resp 20 | Ht 70.0 in | Wt 232.0 lb

## 2023-09-26 DIAGNOSIS — D45 Polycythemia vera: Secondary | ICD-10-CM | POA: Insufficient documentation

## 2023-09-26 DIAGNOSIS — I428 Other cardiomyopathies: Secondary | ICD-10-CM

## 2023-09-26 DIAGNOSIS — E1122 Type 2 diabetes mellitus with diabetic chronic kidney disease: Secondary | ICD-10-CM

## 2023-09-26 DIAGNOSIS — I441 Atrioventricular block, second degree: Secondary | ICD-10-CM | POA: Insufficient documentation

## 2023-09-26 DIAGNOSIS — N1831 Chronic kidney disease, stage 3a: Secondary | ICD-10-CM | POA: Diagnosis not present

## 2023-09-26 DIAGNOSIS — I10 Essential (primary) hypertension: Secondary | ICD-10-CM

## 2023-09-26 DIAGNOSIS — Z794 Long term (current) use of insulin: Secondary | ICD-10-CM

## 2023-09-26 DIAGNOSIS — Z7982 Long term (current) use of aspirin: Secondary | ICD-10-CM | POA: Insufficient documentation

## 2023-09-26 LAB — CBC WITH DIFFERENTIAL (CANCER CENTER ONLY)
Abs Immature Granulocytes: 0.11 10*3/uL — ABNORMAL HIGH (ref 0.00–0.07)
Basophils Absolute: 0.1 10*3/uL (ref 0.0–0.1)
Basophils Relative: 1 %
Eosinophils Absolute: 0 10*3/uL (ref 0.0–0.5)
Eosinophils Relative: 1 %
HCT: 31.6 % — ABNORMAL LOW (ref 39.0–52.0)
Hemoglobin: 10.2 g/dL — ABNORMAL LOW (ref 13.0–17.0)
Immature Granulocytes: 2 %
Lymphocytes Relative: 10 %
Lymphs Abs: 0.7 10*3/uL (ref 0.7–4.0)
MCH: 33.4 pg (ref 26.0–34.0)
MCHC: 32.3 g/dL (ref 30.0–36.0)
MCV: 103.6 fL — ABNORMAL HIGH (ref 80.0–100.0)
Monocytes Absolute: 0.5 10*3/uL (ref 0.1–1.0)
Monocytes Relative: 8 %
Neutro Abs: 5.2 10*3/uL (ref 1.7–7.7)
Neutrophils Relative %: 78 %
Platelet Count: 376 10*3/uL (ref 150–400)
RBC: 3.05 MIL/uL — ABNORMAL LOW (ref 4.22–5.81)
RDW: 18.6 % — ABNORMAL HIGH (ref 11.5–15.5)
WBC Count: 6.5 10*3/uL (ref 4.0–10.5)
nRBC: 0.3 % — ABNORMAL HIGH (ref 0.0–0.2)

## 2023-09-26 LAB — SAVE SMEAR(SSMR), FOR PROVIDER SLIDE REVIEW

## 2023-09-26 LAB — CMP (CANCER CENTER ONLY)
ALT: 10 U/L (ref 0–44)
AST: 13 U/L — ABNORMAL LOW (ref 15–41)
Albumin: 4.1 g/dL (ref 3.5–5.0)
Alkaline Phosphatase: 67 U/L (ref 38–126)
Anion gap: 10 (ref 5–15)
BUN: 29 mg/dL — ABNORMAL HIGH (ref 8–23)
CO2: 24 mmol/L (ref 22–32)
Calcium: 8.7 mg/dL — ABNORMAL LOW (ref 8.9–10.3)
Chloride: 105 mmol/L (ref 98–111)
Creatinine: 1.24 mg/dL (ref 0.61–1.24)
GFR, Estimated: >60 mL/min (ref >60)
Glucose, Bld: 97 mg/dL (ref 70–99)
Potassium: 3.9 mmol/L (ref 3.5–5.1)
Sodium: 139 mmol/L (ref 135–145)
Total Bilirubin: 0.7 mg/dL (ref 0.0–1.2)
Total Protein: 6.7 g/dL (ref 6.5–8.1)

## 2023-09-26 LAB — IRON AND IRON BINDING CAPACITY (CC-WL,HP ONLY)
Iron: 130 ug/dL (ref 45–182)
Saturation Ratios: 50 % — ABNORMAL HIGH (ref 17.9–39.5)
TIBC: 260 ug/dL (ref 250–450)
UIBC: 130 ug/dL (ref 117–376)

## 2023-09-26 LAB — FERRITIN: Ferritin: 906 ng/mL — ABNORMAL HIGH (ref 24–336)

## 2023-09-26 LAB — LACTATE DEHYDROGENASE: LDH: 470 U/L — ABNORMAL HIGH (ref 98–192)

## 2023-09-26 NOTE — Progress Notes (Signed)
 Hematology and Oncology Follow Up Visit  William Villanueva [id number] 1950/08/10 73 y.o. 09/26/2023   Principle Diagnosis:  Polycythemia vera-JAK2 positive Heart block-Mobitz II  Current Therapy:   Hydrea  500 mg p.o.TID -dose changed on 07/04/2023  --DC on 08/03/2023 Aspirin  81 mg by mouth daily Phlebotomy to maintain hematocrit below 45% Jakafi  10 mg p.o. twice daily-start on 08/07/2023     Interim History:  Mr.  Mennenga is back for followup.  He is doing well and Jakafi .  His platelet count is coming down really nicely.  I think next week, he will have the procedure for the insertion of the device to try to help his heart pump better.  Hopefully this will make him feel better.  He has had no problems with bleeding.  He has had no problems with nausea or vomiting.  He still has a back issues.  He does have a lot of bruising.  His iron studies that we did back in April showed a ferritin of 517 with an iron saturation of 50%.  He has had no fever.  Overall, I would say that his performance status is probably ECOG 2.    Medications:  Current Outpatient Medications:    acetaminophen  (TYLENOL ) 500 MG tablet, Take 1,000 mg by mouth every 6 (six) hours as needed for headache., Disp: , Rfl:    allopurinol  (ZYLOPRIM ) 300 MG tablet, Take 1 tablet (300 mg total) by mouth daily., Disp: 90 tablet, Rfl: 1   Ascorbic Acid (VITAMIN C PO), Take 1 tablet by mouth daily., Disp: , Rfl:    budesonide  (PULMICORT ) 0.5 MG/2ML nebulizer solution, Take 2 mLs (0.5 mg total) by nebulization 2 (two) times daily. (RINSE MOUTH AFTER USE) (Patient taking differently: Take 2 mLs by nebulization daily.), Disp: 360 mL, Rfl: 11   cholestyramine  (QUESTRAN ) 4 GM/DOSE powder, Take 4 g by mouth daily as needed (diarrhea)., Disp: , Rfl:    clotrimazole (LOTRIMIN) 1 % cream, Apply 1 Application topically daily as needed (irritation)., Disp: , Rfl:    colestipol  (COLESTID ) 1 g tablet, Take 2 g by mouth daily., Disp: ,  Rfl:    Cyanocobalamin  (B-12 PO), Take 1 tablet by mouth daily., Disp: , Rfl:    dicyclomine  (BENTYL ) 20 MG tablet, Take 20 mg by mouth 2 (two) times daily., Disp: , Rfl:    insulin  degludec (TRESIBA FLEXTOUCH) 100 UNIT/ML SOPN FlexTouch Pen, Inject 16 Units into the skin daily after breakfast., Disp: , Rfl:    losartan  (COZAAR ) 25 MG tablet, TAKE 1/2 TABLET BY MOUTH DAILY, Disp: 40 tablet, Rfl: 3   magnesium  gluconate (MAGONATE) 500 MG tablet, Take 500 mg by mouth daily., Disp: , Rfl:    metoprolol  succinate (TOPROL  XL) 25 MG 24 hr tablet, Take 0.5 tablets (12.5 mg total) by mouth daily., Disp: 45 tablet, Rfl: 3   omeprazole  (PRILOSEC) 20 MG capsule, Take 20 mg by mouth in the morning and at bedtime., Disp: , Rfl:    oxybutynin  (DITROPAN  XL) 15 MG 24 hr tablet, Take 15 mg by mouth in the morning., Disp: , Rfl:    ruxolitinib  phosphate (JAKAFI ) 10 MG tablet, Take 1 tablet (10 mg total) by mouth 2 (two) times daily., Disp: 60 tablet, Rfl: 4   sodium bicarbonate  650 MG tablet, Take 1 tablet (650 mg total) by mouth 2 (two) times daily., Disp: 60 tablet, Rfl: 0   tamsulosin  (FLOMAX ) 0.4 MG CAPS capsule, Take 0.4 mg by mouth daily., Disp: , Rfl:    torsemide  (DEMADEX ) 20  MG tablet, Take 40 mg by mouth 2 (two) times daily., Disp: , Rfl:    diclofenac  Sodium (VOLTAREN ) 1 % GEL, Apply 2 g topically daily as needed (pain). (Patient not taking: Reported on 09/26/2023), Disp: , Rfl:    fluorouracil (EFUDEX) 5 % cream, 1 Application daily. (Patient not taking: Reported on 09/26/2023), Disp: , Rfl:   Allergies:  Allergies  Allergen Reactions   Advil [Ibuprofen] Itching   Keflex [Cephalexin] Diarrhea and Other (See Comments)    Caused C-diff, also    Wellbutrin [Bupropion] Hives and Other (See Comments)   Prednisone  Itching and Other (See Comments)    Abdominal pain, also    Temazepam  Other (See Comments)    Dizziness    Trazodone  And Nefazodone Other (See Comments)    Dizziness   Entresto   [Sacubitril -Valsartan ] Other (See Comments)    Unknown   Prozac [Fluoxetine Hcl] Itching    Past Medical History, Surgical history, Social history, and Family History were reviewed and updated.  Review of Systems: Review of Systems  Constitutional: Negative.   HENT: Negative.    Eyes: Negative.   Respiratory:  Positive for shortness of breath.   Cardiovascular:  Positive for chest pain.  Gastrointestinal: Negative.   Genitourinary: Negative.   Musculoskeletal:  Positive for back pain.  Skin: Negative.   Neurological: Negative.   Endo/Heme/Allergies: Negative.   Psychiatric/Behavioral: Negative.      Physical Exam: Vital signs temperature 98.  Pulse 79.  Blood pressure 104/44.  Weight is 232 pounds.    Physical Exam Vitals reviewed.  HENT:     Head: Normocephalic and atraumatic.  Eyes:     Pupils: Pupils are equal, round, and reactive to light.  Cardiovascular:     Rate and Rhythm: Normal rate and regular rhythm.     Heart sounds: Normal heart sounds.  Pulmonary:     Effort: Pulmonary effort is normal.     Breath sounds: Normal breath sounds.  Abdominal:     General: Bowel sounds are normal.     Palpations: Abdomen is soft.  Musculoskeletal:        General: No tenderness or deformity. Normal range of motion.     Cervical back: Normal range of motion.  Lymphadenopathy:     Cervical: No cervical adenopathy.  Skin:    General: Skin is warm and dry.     Findings: No erythema or rash.  Neurological:     Mental Status: He is alert and oriented to person, place, and time.  Psychiatric:        Behavior: Behavior normal.        Thought Content: Thought content normal.        Judgment: Judgment normal.     Lab Results  Component Value Date   WBC 6.5 09/26/2023   HGB 10.2 (L) 09/26/2023   HCT 31.6 (L) 09/26/2023   MCV 103.6 (H) 09/26/2023   PLT 376 09/26/2023     Chemistry      Component Value Date/Time   NA 139 09/26/2023 0753   NA 140 03/02/2023 1304   NA  141 04/10/2017 0923   NA 140 04/05/2016 0739   K 3.9 09/26/2023 0753   K 3.5 04/10/2017 0923   K 4.2 04/05/2016 0739   CL 105 09/26/2023 0753   CL 108 04/10/2017 0923   CO2 24 09/26/2023 0753   CO2 24 04/10/2017 0923   CO2 20 (L) 04/05/2016 0739   BUN 29 (H) 09/26/2023 0753   BUN 29 (  H) 03/02/2023 1304   BUN 12 04/10/2017 0923   BUN 14.7 04/05/2016 0739   CREATININE 1.24 09/26/2023 0753   CREATININE 1.1 04/10/2017 0923   CREATININE 1.1 04/05/2016 0739      Component Value Date/Time   CALCIUM  8.7 (L) 09/26/2023 0753   CALCIUM  8.4 04/10/2017 0923   CALCIUM  9.0 04/05/2016 0739   ALKPHOS 67 09/26/2023 0753   ALKPHOS 77 04/10/2017 0923   ALKPHOS 91 04/05/2016 0739   AST 13 (L) 09/26/2023 0753   AST 34 04/05/2016 0739   ALT 10 09/26/2023 0753   ALT 32 04/10/2017 0923   ALT 40 04/05/2016 0739   BILITOT 0.7 09/26/2023 0753   BILITOT 0.48 04/05/2016 0739      Impression and Plan:  Mr. Banas is 73 year old gentleman with polycythemia vera.  Obviously, the bigger issue now is his heart.  I am not sure when he will have this carotid procedure.  Hopefully, this will be within a month.  For right now, I am glad that the platelet count is doing a lot better.  I looked at his blood smear under the microscope.  His platelet count was certainly down compared to when it was a few weeks ago.  I would like to see him back in about a month.  At that point, hopefully he will be feeling better.  Hopefully this cardiac procedure will help him.   Ivor Mars, MD 5/21/20258:35 AM

## 2023-09-28 ENCOUNTER — Other Ambulatory Visit (HOSPITAL_COMMUNITY): Payer: Self-pay

## 2023-09-30 ENCOUNTER — Other Ambulatory Visit: Payer: Self-pay

## 2023-10-02 ENCOUNTER — Other Ambulatory Visit (HOSPITAL_COMMUNITY): Payer: Self-pay

## 2023-10-02 ENCOUNTER — Other Ambulatory Visit: Payer: Self-pay

## 2023-10-02 NOTE — Progress Notes (Signed)
 Specialty Pharmacy Refill Coordination Note  William Villanueva is a 73 y.o. male contacted today regarding refills of specialty medication(s) Ruxolitinib  Phosphate (JAKAFI )   Patient requested Delivery   Delivery date: 10/03/23   Verified address: [address] [place]  [address]   Medication will be filled on 10/02/23.

## 2023-10-04 ENCOUNTER — Ambulatory Visit (HOSPITAL_BASED_OUTPATIENT_CLINIC_OR_DEPARTMENT_OTHER): Payer: Self-pay | Admitting: Vascular Surgery

## 2023-10-04 ENCOUNTER — Encounter (HOSPITAL_COMMUNITY)
Admission: RE | Disposition: A | Discharge status: Home / Self Care | Payer: Self-pay | Source: Home / Self Care | Attending: Surgery

## 2023-10-04 ENCOUNTER — Encounter (HOSPITAL_COMMUNITY): Payer: Self-pay | Admitting: Surgery

## 2023-10-04 ENCOUNTER — Other Ambulatory Visit: Payer: Self-pay

## 2023-10-04 ENCOUNTER — Ambulatory Visit (HOSPITAL_COMMUNITY): Payer: Self-pay | Admitting: Vascular Surgery

## 2023-10-04 ENCOUNTER — Ambulatory Visit (HOSPITAL_COMMUNITY)
Admission: RE | Admit: 2023-10-04 | Discharge: 2023-10-04 | Disposition: A | Discharge status: Home / Self Care | Attending: Surgery | Admitting: Surgery

## 2023-10-04 ENCOUNTER — Other Ambulatory Visit (HOSPITAL_COMMUNITY): Payer: Self-pay

## 2023-10-04 DIAGNOSIS — J449 Chronic obstructive pulmonary disease, unspecified: Secondary | ICD-10-CM | POA: Diagnosis not present

## 2023-10-04 DIAGNOSIS — E119 Type 2 diabetes mellitus without complications: Secondary | ICD-10-CM | POA: Diagnosis not present

## 2023-10-04 DIAGNOSIS — G4730 Sleep apnea, unspecified: Secondary | ICD-10-CM | POA: Diagnosis not present

## 2023-10-04 DIAGNOSIS — K219 Gastro-esophageal reflux disease without esophagitis: Secondary | ICD-10-CM | POA: Insufficient documentation

## 2023-10-04 DIAGNOSIS — I5022 Chronic systolic (congestive) heart failure: Secondary | ICD-10-CM | POA: Diagnosis not present

## 2023-10-04 DIAGNOSIS — I5043 Acute on chronic combined systolic (congestive) and diastolic (congestive) heart failure: Secondary | ICD-10-CM | POA: Diagnosis not present

## 2023-10-04 DIAGNOSIS — N1831 Chronic kidney disease, stage 3a: Secondary | ICD-10-CM

## 2023-10-04 DIAGNOSIS — N289 Disorder of kidney and ureter, unspecified: Secondary | ICD-10-CM | POA: Insufficient documentation

## 2023-10-04 DIAGNOSIS — Z87891 Personal history of nicotine dependence: Secondary | ICD-10-CM | POA: Insufficient documentation

## 2023-10-04 DIAGNOSIS — Z95810 Presence of automatic (implantable) cardiac defibrillator: Secondary | ICD-10-CM | POA: Diagnosis not present

## 2023-10-04 DIAGNOSIS — E7800 Pure hypercholesterolemia, unspecified: Secondary | ICD-10-CM | POA: Diagnosis not present

## 2023-10-04 DIAGNOSIS — M1990 Unspecified osteoarthritis, unspecified site: Secondary | ICD-10-CM | POA: Diagnosis not present

## 2023-10-04 DIAGNOSIS — E1122 Type 2 diabetes mellitus with diabetic chronic kidney disease: Secondary | ICD-10-CM | POA: Diagnosis not present

## 2023-10-04 DIAGNOSIS — D45 Polycythemia vera: Secondary | ICD-10-CM | POA: Diagnosis not present

## 2023-10-04 DIAGNOSIS — Z833 Family history of diabetes mellitus: Secondary | ICD-10-CM | POA: Insufficient documentation

## 2023-10-04 DIAGNOSIS — I110 Hypertensive heart disease with heart failure: Secondary | ICD-10-CM | POA: Insufficient documentation

## 2023-10-04 DIAGNOSIS — Z794 Long term (current) use of insulin: Secondary | ICD-10-CM | POA: Diagnosis not present

## 2023-10-04 DIAGNOSIS — Z8249 Family history of ischemic heart disease and other diseases of the circulatory system: Secondary | ICD-10-CM | POA: Diagnosis not present

## 2023-10-04 DIAGNOSIS — I130 Hypertensive heart and chronic kidney disease with heart failure and stage 1 through stage 4 chronic kidney disease, or unspecified chronic kidney disease: Secondary | ICD-10-CM

## 2023-10-04 DIAGNOSIS — E1159 Type 2 diabetes mellitus with other circulatory complications: Secondary | ICD-10-CM

## 2023-10-04 DIAGNOSIS — Z79899 Other long term (current) drug therapy: Secondary | ICD-10-CM | POA: Diagnosis not present

## 2023-10-04 DIAGNOSIS — Z8711 Personal history of peptic ulcer disease: Secondary | ICD-10-CM | POA: Insufficient documentation

## 2023-10-04 DIAGNOSIS — G709 Myoneural disorder, unspecified: Secondary | ICD-10-CM | POA: Insufficient documentation

## 2023-10-04 HISTORY — PX: BAROREFLEX SYSTEM INSERTION: EP1254

## 2023-10-04 LAB — GLUCOSE, CAPILLARY
Glucose-Capillary: 107 mg/dL — ABNORMAL HIGH (ref 70–99)
Glucose-Capillary: 113 mg/dL — ABNORMAL HIGH (ref 70–99)
Glucose-Capillary: 130 mg/dL — ABNORMAL HIGH (ref 70–99)

## 2023-10-04 SURGERY — INSERTION, CAROTID SINUS BAROREFLEX ACTIVATION DEVICE
Anesthesia: General | Site: Neck | Laterality: Right

## 2023-10-04 MED ORDER — LACTATED RINGERS IV SOLN: INTRAVENOUS | Status: DC

## 2023-10-04 MED ORDER — SODIUM CHLORIDE 0.9 % IV SOLN: INTRAVENOUS | Status: DC | PRN

## 2023-10-04 MED ORDER — HYDROCODONE-ACETAMINOPHEN 5-325 MG PO TABS
1.0000 | ORAL_TABLET | Freq: Four times a day (QID) | ORAL | 0 refills | Status: DC | PRN
  Filled 2023-10-04: qty 15, 4d supply, fill #0

## 2023-10-04 MED ORDER — LIDOCAINE 2% (20 MG/ML) 5 ML SYRINGE
INTRAMUSCULAR | Status: DC | PRN
  Administered 2023-10-04: 60 mg via INTRAVENOUS

## 2023-10-04 MED ORDER — ONDANSETRON HCL 4 MG/2ML IJ SOLN
INTRAMUSCULAR | Status: DC | PRN
  Administered 2023-10-04: 4 mg via INTRAVENOUS

## 2023-10-04 MED ORDER — SODIUM CHLORIDE 0.9 % IV SOLN: INTRAVENOUS | Status: DC

## 2023-10-04 MED ORDER — FENTANYL CITRATE (PF) 250 MCG/5ML IJ SOLN
INTRAMUSCULAR | Status: AC
Start: 2023-10-04 — End: ?
  Filled 2023-10-04: qty 5

## 2023-10-04 MED ORDER — PROPOFOL 10 MG/ML IV BOLUS
INTRAVENOUS | Status: DC | PRN
  Administered 2023-10-04: 100 mg via INTRAVENOUS

## 2023-10-04 MED ORDER — PHENYLEPHRINE 80 MCG/ML (10ML) SYRINGE FOR IV PUSH (FOR BLOOD PRESSURE SUPPORT)
PREFILLED_SYRINGE | INTRAVENOUS | Status: DC | PRN
  Administered 2023-10-04: 160 ug via INTRAVENOUS

## 2023-10-04 MED ORDER — VASOPRESSIN 20 UNIT/ML IV SOLN
INTRAVENOUS | Status: DC | PRN
Start: 2023-10-04 — End: 2023-10-04
  Administered 2023-10-04 (×3): 1 [IU] via INTRAVENOUS
  Administered 2023-10-04 (×2): .5 [IU] via INTRAVENOUS

## 2023-10-04 MED ORDER — PHENYLEPHRINE HCL-NACL 20-0.9 MG/250ML-% IV SOLN
INTRAVENOUS | Status: DC | PRN
  Administered 2023-10-04: 40 ug/min via INTRAVENOUS

## 2023-10-04 MED ORDER — AMISULPRIDE (ANTIEMETIC) 5 MG/2ML IV SOLN: 10.0000 mg | Freq: Once | INTRAVENOUS | Status: DC | PRN

## 2023-10-04 MED ORDER — SUGAMMADEX SODIUM 200 MG/2ML IV SOLN
INTRAVENOUS | Status: DC | PRN
  Administered 2023-10-04: 200 mg via INTRAVENOUS

## 2023-10-04 MED ORDER — ROCURONIUM BROMIDE 10 MG/ML (PF) SYRINGE
PREFILLED_SYRINGE | INTRAVENOUS | Status: DC | PRN
  Administered 2023-10-04: 60 mg via INTRAVENOUS

## 2023-10-04 MED ORDER — CHLORHEXIDINE GLUCONATE 0.12 % MT SOLN
15.0000 mL | Freq: Once | OROMUCOSAL | Status: AC
  Administered 2023-10-04: 15 mL via OROMUCOSAL
  Filled 2023-10-04: qty 15

## 2023-10-04 MED ORDER — INSULIN ASPART 100 UNIT/ML IJ SOLN: 0.0000 [IU] | INTRAMUSCULAR | Status: DC | PRN

## 2023-10-04 MED ORDER — CHLORHEXIDINE GLUCONATE CLOTH 2 % EX PADS: 6.0000 | MEDICATED_PAD | Freq: Once | CUTANEOUS | Status: DC

## 2023-10-04 MED ORDER — VANCOMYCIN HCL 1500 MG/300ML IV SOLN
1500.0000 mg | INTRAVENOUS | Status: AC
  Administered 2023-10-04: 1500 mg via INTRAVENOUS
  Filled 2023-10-04: qty 300

## 2023-10-04 MED ORDER — VASOPRESSIN 20 UNIT/ML IV SOLN
INTRAVENOUS | Status: AC
  Filled 2023-10-04: qty 1

## 2023-10-04 MED ORDER — ACETAMINOPHEN 500 MG PO TABS
1000.0000 mg | ORAL_TABLET | Freq: Once | ORAL | Status: AC
  Administered 2023-10-04: 1000 mg via ORAL

## 2023-10-04 MED ORDER — SODIUM CHLORIDE 0.9 % IV SOLN
0.1500 ug/kg/min | INTRAVENOUS | Status: AC
  Administered 2023-10-04: .1 ug/kg/min via INTRAVENOUS
  Filled 2023-10-04: qty 2000

## 2023-10-04 MED ORDER — ACETAMINOPHEN 500 MG PO TABS
ORAL_TABLET | ORAL | Status: AC
  Filled 2023-10-04: qty 2

## 2023-10-04 MED ORDER — FENTANYL CITRATE (PF) 250 MCG/5ML IJ SOLN
INTRAMUSCULAR | Status: DC | PRN
  Administered 2023-10-04: 50 ug via INTRAVENOUS

## 2023-10-04 MED ORDER — PROPOFOL 10 MG/ML IV BOLUS
INTRAVENOUS | Status: AC
  Filled 2023-10-04: qty 20

## 2023-10-04 MED ORDER — ACETAMINOPHEN 500 MG PO TABS: 1000.0000 mg | ORAL_TABLET | Freq: Once | ORAL | Status: DC

## 2023-10-04 MED ORDER — 0.9 % SODIUM CHLORIDE (POUR BTL) OPTIME
TOPICAL | Status: DC | PRN
  Administered 2023-10-04: 1000 mL

## 2023-10-04 MED ORDER — FENTANYL CITRATE (PF) 100 MCG/2ML IJ SOLN: 25.0000 ug | INTRAMUSCULAR | Status: DC | PRN

## 2023-10-04 MED ORDER — LIDOCAINE HCL (PF) 1 % IJ SOLN
INTRAMUSCULAR | Status: AC
  Filled 2023-10-04: qty 30

## 2023-10-04 MED ORDER — ORAL CARE MOUTH RINSE: 15.0000 mL | Freq: Once | OROMUCOSAL | Status: AC

## 2023-10-04 MED ORDER — DEXAMETHASONE SODIUM PHOSPHATE 10 MG/ML IJ SOLN
INTRAMUSCULAR | Status: DC | PRN
  Administered 2023-10-04: 5 mg via INTRAVENOUS

## 2023-10-04 SURGICAL SUPPLY — 35 items
BAG COUNTER SPONGE SURGICOUNT (BAG) ×1 IMPLANT
CANISTER SUCTION 3000ML PPV (SUCTIONS) ×1 IMPLANT
CHLORAPREP W/TINT 26 (MISCELLANEOUS) ×1 IMPLANT
CLIP TI MEDIUM 6 (CLIP) IMPLANT
CLIP TI WIDE RED SMALL 6 (CLIP) IMPLANT
COVER PROBE W GEL 5X96 (DRAPES) ×1 IMPLANT
COVER SURGICAL LIGHT HANDLE (MISCELLANEOUS) IMPLANT
DERMABOND ADVANCED .7 DNX12 (GAUZE/BANDAGES/DRESSINGS) ×1 IMPLANT
ELECTRODE REM PT RTRN 9FT ADLT (ELECTROSURGICAL) ×1 IMPLANT
ELECTRODE SOLI GEL RDN PROPADZ (MISCELLANEOUS) IMPLANT
GENERATOR IPG BAROSTIM 2104 (Generator) IMPLANT
GLOVE SURG SS PI 7.5 STRL IVOR (GLOVE) ×1 IMPLANT
GOWN STRL REUS W/ TWL LRG LVL3 (GOWN DISPOSABLE) ×2 IMPLANT
GOWN STRL REUS W/ TWL XL LVL3 (GOWN DISPOSABLE) ×1 IMPLANT
HEMOSTAT SNOW SURGICEL 2X4 (HEMOSTASIS) IMPLANT
KIT BASIN OR (CUSTOM PROCEDURE TRAY) ×1 IMPLANT
KIT TURNOVER KIT B (KITS) ×1 IMPLANT
LEAD CAROTID BAROSTIM 1036 (Lead) IMPLANT
MARKER SKIN DUAL TIP RULER LAB (MISCELLANEOUS) ×1 IMPLANT
NDL 18GX1X1/2 (RX/OR ONLY) (NEEDLE) ×1 IMPLANT
NEEDLE 18GX1X1/2 (RX/OR ONLY) (NEEDLE) ×1 IMPLANT
NS IRRIG 1000ML POUR BTL (IV SOLUTION) ×2 IMPLANT
PACK CAROTID (CUSTOM PROCEDURE TRAY) ×1 IMPLANT
PAD ARMBOARD POSITIONER FOAM (MISCELLANEOUS) ×2 IMPLANT
POSITIONER HEAD DONUT 9IN (MISCELLANEOUS) ×1 IMPLANT
SUT ETHIBOND CT1 BRD #0 30IN (SUTURE) ×2 IMPLANT
SUT ETHILON 3 0 PS 1 (SUTURE) IMPLANT
SUT PROLENE 6 0 BV (SUTURE) ×8 IMPLANT
SUT SILK 0 FSL (SUTURE) IMPLANT
SUT VIC AB 3-0 SH 27X BRD (SUTURE) ×2 IMPLANT
SUT VIC AB 4-0 PS2 18 (SUTURE) ×2 IMPLANT
SYR 5ML LL (SYRINGE) ×1 IMPLANT
SYR BULB IRRIG 60ML STRL (SYRINGE) ×1 IMPLANT
TOWEL GREEN STERILE (TOWEL DISPOSABLE) ×1 IMPLANT
WATER STERILE IRR 1000ML POUR (IV SOLUTION) ×1 IMPLANT

## 2023-10-04 NOTE — H&P (Signed)
 V[facility] and [facility]in Specialist of [facility]   Patient name: William Villanueva         MRN: [id number]        DOB: 12/22/1950            Sex: male     REQUESTING PROVIDER:     Dr. Alease Amend     REASON FOR CONSULT:    Barostim evaluation   HISTORY OF PRESENT ILLNESS:    William Villanueva is a 73 y.o. male, who is referred for Barostim evaluation.  The patient suffers from chronic systolic heart failure, dating back to 2013.  His most recent ejection fraction was less than 20%.  He recently underwent right heart catheterization.  He suffers from NYHA class IIIb symptoms, mainly shortness of breath, fatigue and lack of energy     The patient is a diabetic.  He has a history of polycythemia vera.  He is on a statin for hypercholesterolemia.  He has a Retail buyer ICD in place   PAST MEDICAL HISTORY          Past Medical History:  Diagnosis Date   AV block, 2nd degree 2015    [employer] Allure Quadra pulse generator [id number], model PM 3242   Back pain     Cardiomyopathy (HCC)      Nonischemic 45%.    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/31/2014   Polycythemia vera(238.4)              FAMILY HISTORY         Family History  Problem Relation Age of Onset   Stroke Mother     Other Father          Deceased, car fell on him   Diabetes Sister     Hypertension Sister     Heart disease Maternal Grandmother          Pacemaker, MI          SOCIAL HISTORY:    Social History         Socioeconomic History   Marital status: Married      Spouse name: Devra Fontana   Number of children: 0   Years of education: Not on file   Highest education level: High school graduate  Occupational History    Occupation: Retired  Tobacco Use   Smoking status: Former      Current packs/day: 0.00      Average packs/day: 1 pack/day for 44.8 years (44.8 ttl pk-yrs)      Types: Cigarettes      Start date: 06/05/1968      Quit date: 04/05/2013      Years since quitting: 10.4      Passive exposure: Never   Smokeless tobacco: Never  Vaping Use   Vaping status: Never Used  Substance and Sexual Activity   Alcohol  use: Yes      Alcohol /week: 0.0 standard drinks of alcohol       Comment: rare   Drug use: No   Sexual activity: Not Currently  Other Topics Concern   Not on file  Social History Narrative    Lives at home with  wife in a one story home.  Has no children.  Does not work.  Getting workman's comp.  Education: 4 years trade school.     Social Drivers of Acupuncturist Strain: Low Risk  (02/06/2022)    Overall Financial Resource Strain (CARDIA)     Difficulty of Paying Living Expenses: Not very hard  Food Insecurity: No Food Insecurity (12/22/2022)    Hunger Vital Sign     Worried About Running Out of Food in the Last Year: Never true     Ran Out of Food in the Last Year: Never true  Transportation Needs: No Transportation Needs (12/22/2022)    PRAPARE - Therapist, art (Medical): No     Lack of Transportation (Non-Medical): No  Physical Activity: Not on file  Stress: Not on file  Social Connections: Unknown (09/15/2021)    Received from [facility]    Social Network     Social Network: Not on file  Intimate Partner Violence: Not At Risk (12/22/2022)    Humiliation, Afraid, Rape, and Kick questionnaire     Fear of Current or Ex-Partner: No     Emotionally Abused: No     Physically Abused: No     Sexually Abused: No      ALLERGIES:      Allergies       Allergies  Allergen Reactions   Advil [Ibuprofen] Itching   Keflex [Cephalexin] Diarrhea and Other (See Comments)      Caused C-diff, also     Wellbutrin  [Bupropion] Hives and Other (See Comments)   Prednisone  Itching and Other (See Comments)      Abdominal pain, also     Temazepam  Other (See Comments)      Dizziness    Trazodone  And Nefazodone Other (See Comments)      Dizziness   Entresto  [Sacubitril -Valsartan ] Other (See Comments)      Unknown   Prozac [Fluoxetine Hcl] Itching        CURRENT MEDICATIONS:            Current Outpatient Medications  Medication Sig Dispense Refill   acetaminophen  (TYLENOL ) 500 MG tablet Take 1,000 mg by mouth every 6 (six) hours as needed for headache.       allopurinol  (ZYLOPRIM ) 300 MG tablet Take 1 tablet (300 mg total) by mouth daily. 90 tablet 1   Ascorbic Acid (VITAMIN C PO) Take 1 tablet by mouth daily.       budesonide  (PULMICORT ) 0.5 MG/2ML nebulizer solution Take 2 mLs (0.5 mg total) by nebulization 2 (two) times daily. (RINSE MOUTH AFTER USE) (Patient taking differently: Take 2 mLs by nebulization daily.) 360 mL 11   cholestyramine  (QUESTRAN ) 4 GM/DOSE powder Take 4 g by mouth daily as needed (diarrhea).       clotrimazole (LOTRIMIN) 1 % cream Apply 1 Application topically daily as needed (irritation).       Cyanocobalamin  (B-12 PO) Take 1 tablet by mouth daily.       diclofenac  Sodium (VOLTAREN ) 1 % GEL Apply 2 g topically daily as needed (pain).       dicyclomine  (BENTYL ) 20 MG tablet Take 20 mg by mouth 2 (two) times daily.       hydroxyurea  (HYDREA ) 500 MG capsule TAKE TWO CAPSULES BY MOUTH DAILY 160 capsule 4   insulin  degludec (TRESIBA FLEXTOUCH) 100 UNIT/ML SOPN FlexTouch Pen Inject 16 Units into the skin daily after  breakfast.       losartan  (COZAAR ) 25 MG tablet TAKE 1/2 TABLET BY MOUTH DAILY 40 tablet 3   magnesium  gluconate (MAGONATE) 500 MG tablet Take 500 mg by mouth daily.       metoprolol  succinate (TOPROL  XL) 25 MG 24 hr tablet Take 0.5 tablets (12.5 mg total) by mouth daily. 45 tablet 3   omeprazole  (PRILOSEC) 20 MG capsule Take 20 mg by mouth in the morning and at bedtime.        oxybutynin  (DITROPAN  XL) 15 MG 24 hr tablet Take 15 mg by mouth in the morning.       ruxolitinib  phosphate (JAKAFI ) 10 MG tablet Take 1 tablet (10 mg total) by mouth 2 (two) times daily. 60 tablet 4   sodium bicarbonate  650 MG tablet Take 1 tablet (650 mg total) by mouth 2 (two) times daily. 60 tablet 0   spironolactone  (ALDACTONE ) 25 MG tablet Take 0.5 tablets (12.5 mg total) by mouth daily. 45 tablet 3   tamsulosin  (FLOMAX ) 0.4 MG CAPS capsule Take 0.4 mg by mouth daily.       torsemide  (DEMADEX ) 20 MG tablet Take 2 tablets (40 mg total) by mouth 2 (two) times daily. 120 tablet 3      No current facility-administered medications for this visit.        REVIEW OF SYSTEMS:    [X]  denotes positive finding, [ ]  denotes negative finding Cardiac   Comments:  Chest pain or chest pressure:      Shortness of breath upon exertion:      Short of breath when lying flat:      Irregular heart rhythm:             Vascular      Pain in calf, thigh, or hip brought on by ambulation:      Pain in feet at night that wakes you up from your sleep:       Blood clot in your veins:      Leg swelling:  x           Pulmonary      Oxygen  at home:      Productive cough:       Wheezing:              Neurologic      Sudden weakness in arms or legs:       Sudden numbness in arms or legs:       Sudden onset of difficulty speaking or slurred speech:      Temporary loss of vision in one eye:       Problems with dizziness:              Gastrointestinal      Blood in stool:         Vomited blood:              Genitourinary      Burning when urinating:       Blood in urine:             Psychiatric      Major depression:              Hematologic      Bleeding problems:      Problems with blood clotting too easily:             Skin      Rashes or ulcers:  Constitutional      Fever or chills:        PHYSICAL EXAM:       Vitals:    08/27/23 1057  BP: (!) 146/86  Pulse: 88   Resp: (!) 22  Temp: 97.6 F (36.4 C)  TempSrc: Temporal  SpO2: 97%  Weight: 236 lb (107 kg)  Height: 5\' 10"  (1.778 m)      GENERAL: The patient is a well-nourished male, in no acute distress. The vital signs are documented above. CARDIAC: There is a regular rate and rhythm.  VASCULAR: SonoSite was used to evaluate the carotid bifurcation which is in the mid neck PULMONARY: Nonlabored respirations ABDOMEN: Soft and non-tender with normal pitched bowel sounds.  MUSCULOSKELETAL: There are no major deformities or cyanosis. NEUROLOGIC: No focal weakness or paresthesias are detected. SKIN: There are no ulcers or rashes noted. PSYCHIATRIC: The patient has a normal affect.   STUDIES:    I have reviewed the following: Right Carotid: Velocities in the right ICA are consistent with a 1-39%  stenosis.   Left Carotid: Velocities in the left ICA are consistent with a 1-39%  stenosis.   Vertebrals: Bilateral vertebral arteries demonstrate antegrade flow.  Subclavians: Normal flow hemodynamics were seen in bilateral subclavian               arteries.    ASSESSMENT and PLAN    NYHA class III: The patient would be an excellent candidate for Barostim implant.  His carotid bifurcation is in the mid back with no significant stenosis.  He remains very symptomatic despite guideline-directed medical therapy.  I discussed the details the procedure as well as the risks and benefits and he wishes to proceed.  Will work on Therapist, occupational and get him scheduled in the near future.     Marti Slates, MD, FACS [facility] of [facility] [phone] Pager [phone]

## 2023-10-04 NOTE — Op Note (Signed)
    Patient name: William Villanueva MRN: [id number] DOB: 03-Oct-1950 Sex: male  10/04/2023 Pre-operative Diagnosis: CHF Post-operative diagnosis:  Same Surgeon:  Gareld June Assistants:  Naida Austria, PA Procedure:   Placement of a right sided Barostim device Anesthesia:  General Blood Loss:  minimal Specimens:  none  Findings: Device at position "a"  Indications: This is a 73 year old with NYHA class III heart failure symptoms that persist despite guideline directed medical therapy.  He has been approved for device insertion.  Procedure:  The patient was identified in the holding area and taken to [facility] OR ROOM 17  The patient was then placed supine on the table. general anesthesia was administered.  The patient was prepped and draped in the usual sterile fashion.  A time out was called and antibiotics were administered.  An experienced assistant was necessary for the procedure.  He helped with exposure by providing suction and retraction.  He helped with following sutures and wound closure.  Ultrasound was used to identify the carotid bifurcation which was in the mid neck.  A longitudinal incision was made along the anterior border the sternocleidomastoid.  Cautery was used to divide the subcutaneous tissue and platysma muscle.  I divided the facial vein between 3-0 silk ties.  The carotid bifurcation was then exposed making sure to preserve the nerves around the carotid sinus.  Once I had adequate exposure, attention was turned towards the pocket.  An incision was made 1 fingerbreadth below the clavicle.  Cautery was used divide subcutaneous tissue down to the pectoral fascia.  Using cautery and blunt dissection a pocket was created.  This was on top of the pectoral fascia.  I then used a tonsil clamp to tunnel between the 2 incisions and the lead was brought through the tunnel.  The lead was connected to the generator which was placed in the pocket.  We then began testing.  The device was at  position "A".  We had a good blood pressure response of approximately 10%.  The lead was sutured into position with six 6-0 Prolene sutures.  The strain relief loop was then sutured to the carotid adventitia with two 6-0 Prolene sutures.  Both incisions were irrigated with antibiotic solution.  Hemostasis was meticulous.  The generator was then sutured to the pectoral fascia with 2-0 Ethibond suture.  The neck incision was closed by reapproximating the platysma muscle with 3-0 Vicryl and the skin with 4-0 Vicryl.  The pocket incision was closed by reapproximating the subcutaneous tissue with 3-0 Vicryl and the skin with 4-0 Vicryl.  The patient was successfully awakened from anesthesia and taken recovery in stable condition.   Disposition: To PACU stable.   Reinaldo Caras, M.D., [facility] of [place] Office: [phone] Pager:  [phone]

## 2023-10-04 NOTE — Anesthesia Procedure Notes (Signed)
 Arterial Line Insertion Start/End5/29/2025 9:07 AM  Patient location: Pre-op. Preanesthetic checklist: patient identified, IV checked, site marked, risks and benefits discussed, surgical consent, monitors and equipment checked, pre-op evaluation, timeout performed and anesthesia consent Lidocaine  1% used for infiltration Right, radial was placed Catheter size: 20 G Hand hygiene performed  and maximum sterile barriers used   Attempts: 1 Procedure performed without using ultrasound guided technique. Following insertion, dressing applied and Biopatch. Post procedure assessment: normal and unchanged  Patient tolerated the procedure well with no immediate complications.

## 2023-10-04 NOTE — Anesthesia Postprocedure Evaluation (Signed)
 Anesthesia Post Note  Patient: William Villanueva  Procedure(s) Performed: INSERTION, CAROTID SINUS BAROREFLEX ACTIVATION DEVICE (Right: Neck)     Patient location during evaluation: PACU Anesthesia Type: General Level of consciousness: awake and alert Pain management: pain level controlled Vital Signs Assessment: post-procedure vital signs reviewed and stable Respiratory status: spontaneous breathing, nonlabored ventilation, respiratory function stable and patient connected to nasal cannula oxygen  Cardiovascular status: blood pressure returned to baseline and stable Postop Assessment: no apparent nausea or vomiting Anesthetic complications: no  No notable events documented.  Last Vitals:  Vitals:   10/04/23 1230 10/04/23 1245  BP: (!) 103/59 106/62  Pulse: 78 82  Resp: 12 13  Temp:  36.4 C  SpO2: 90% 91%    Last Pain:  Vitals:   10/04/23 1245  TempSrc:   PainSc: 4                  Melvenia Stabs

## 2023-10-04 NOTE — Progress Notes (Signed)
 Office Visit Note  Patient: William Villanueva             Date of Birth: Oct 11, 1950           MRN: [id number]             PCP: Rae Bugler, MD Referring: Rae Bugler, MD Visit Date: 10/18/2023   Subjective:  Follow-up (Patient states he just got out of the hospital after being there for a week. Patient states his right hand has been cramping. )   Discussed the use of AI scribe software for clinical note transcription with the patient, who gave verbal consent to proceed.  History of Present Illness   William Villanueva is a 73 y.o. male here for follow up for gout and osteoarthritis on allopurinol  300 mg daily and with previous right glenohumeral steroid injection.    He experiences ongoing hand pain and cramping, particularly at the base of the thumb joint, which he describes as his 'worst enemy'. The pain is exacerbated by activities such as pulling his cane or performing tasks, but can also occur spontaneously. He uses a flexible brace with velco straps and applies Icy Hot for relief, which provides some benefit.  He has previously received steroid injections in his shoulder, which provide relief for a couple of months. However, injections in the thumb joint have not been as effective.  No significant swelling in the hand is noted, but there is occasional cramping at night. He has not experienced any recent gout flares despite multiple hospital visits.  He had major medical events with hospitalization after recent barostim device placement.  In addition to his hand issues, he mentions a history of foot problems, including redness, black discoloration, and pain, which were alleviated by wearing diabetic shoes with arches. He also reports a history of thin skin that bruises easily, with recent incidents of skin tearing and blood blisters.       Previous HPI 07/18/2023 William Villanueva is a 73 y.o. male here for follow up for gout and osteoarthritis on allopurinol  300 mg daily.    He currently complains of increased joint pain and hand cramping that seems to be worse following an iron infusion.   He has been experiencing joint pain since receiving an iron infusion one week ago. He is scheduled for a second infusion tomorrow and is concerned about the joint pain being related to the infusion. This is his first experience with iron infusions.   He describes cramping in his hands, which occurs intermittently during activities such as playing a game or sitting. The cramping is painful, and he uses a cream and a brace for relief. Both hands cramp, but the right hand is more affected. He recalls receiving a shot in the past that did not alleviate the pain, as it was administered in a different area than where the pain is located. He has a history of osteoarthritis, particularly affecting his thumb, and notes muscle loss in his hand.   He has a history of heart issues, with an ejection fraction of 21%. He has a pacemaker with a defibrillator due to previous episodes of his heart stopping. He mentions bruising easily and takes vitamin C to help with this.   Previous HPI 04/18/2023 William Villanueva is a 73 y.o. male here for follow up for gout and osteoarthritis on allopurinol  300 mg daily.  He presents with complaints of right shoulder pain, hand cramps, and a persistently swollen and discolored left foot. The shoulder pain,  which has been worsening over the past six to seven months, is severe enough to disrupt sleep. The patient reports that a previous injection into the shoulder helped for a while but symptoms are severe again. Pain often radiating into his arm after certain movements.   In addition to shoulder pain, the patient has been experiencing severe hand cramps in both hands for the past couple of months. These cramps occur spontaneously, without any identifiable trigger.   The patient's left foot has been persistently swollen and discolored, turning a reddish-blue hue. This  has been a constant issue, with the foot remaining swollen and discolored most of the time. The patient has seen two different foot doctors for this issue, both of whom recommended shoe inserts, but the patient remains skeptical about the effectiveness of this solution.   Right thumb MCP steroid injection at last visit did not help symptoms significantly.     Previous HPI 01/25/2023 William Villanueva is a 73 y.o. male here for follow up for gout and osteoarthritis on allopurinol  300 mg daily.  He has numerous medical events since her last visit with hospitalization for heart failure decompensation.  Notes that podiatry evaluation for the persistent pain swelling discoloration in his foot despite well-controlled uric acid level.  Appears symptoms were attributed to peripheral neuropathy and edema primarily.  The benefit from steroid injections earlier this year is mostly worn off he is having a lot of pain at the base of his right thumb is the worst area.  Does not see a lot of visible swelling he has extensive easy bruising on both forearms.  Interested in repeat steroid injection today.  He also has a follow-up appointment with his back specialist coming up anticipates needing repeat injections.   Previous HPI 08/28/22 William Villanueva is a 73 y.o. male here for follow up for rheumatoid arthritis and gout on allopurinol  200 mg daily.  He felt a lot of benefit with the joint steroid injections from previous visit his right hand and left shoulder.  The shoulder remains largely improved although he still has mild symptoms there.  Right hand is giving him more trouble again especially at the thumb he is using a brace that is partially helpful.  No new definite gout flareups of inflammation.     Previous HPI 04/07/22 William Villanueva is a 73 y.o. male here for follow up for rheumatoid arthritis and gout currently on allopurinol  increased to 200 mg daily after our initial visit. He continues having joint  pain and stiffness at multiple areas. Most problematic are his right thumb and his left shoulder. Swelling stays mostly at his feet and ankles. He got sick with RSV and delayed his planned pacemaker battery replacement so that is coming up. Still has a bit of increased cough mostly nonproductive.    Previous HPI 02/23/22 William Villanueva is a 73 y.o. male here for rheumatoid arthritis. He was previously seeing [facility] rheumatology on treatment with methotrexate  and had been on allopurinol  and uloric  for gout. He has history of carpal tunnel syndrome with surgical treatment for the left wrist.  Original diagnosis was RA he had joint pain in multiple areas with abnormal laboratory test.  He was started on injectable methotrexate  but switched to oral methotrexate  due to pain and difficulty with the injections.  He stopped following up with her clinic due to dissatisfaction including never seeing his MD and  not addressing any complaints not directly attributable to his inflammatory arthritis.  As result he has been  off any disease specific medication for over a year does feel he has increased in joint pains off of treatment.  He had also been off of the Uloric  they were prescribing but was restarted on allopurinol  in the past few months with his PCP office.  He reports 1 flare of gout in his foot within the past month but before that had been free of any attacks for several months duration.  He still has residual swelling and erythema in the left foot but was told this may be coming from his heart failure edema as well.   Generally has daily joint pain in multiple areas particular including bilateral hands and in his back.  He gets foot and ankle pain most severe during gout flares has mild symptoms otherwise.  More recently also developed increased left shoulder pain for several days duration for which he went to urgent care for evaluation of this but ended up going to the hospital due to hypoxia and  increased cough so the original problem was never addressed.  Treatment course at the hospital for congestive heart failure exacerbation and improved after several days.  He still had increased cough producing significant discolored sputum and received 1 dose of IV ceftriaxone .  X-ray of the shoulder was obtained demonstrated mild glenohumeral joint osteoarthritis and moderate AC joint osteoarthritis.   Labs reviewed 10/2021 Uric acid 9.7 eGFR 59   Review of Systems  Constitutional:  Positive for fatigue.  HENT:  Positive for mouth dryness. Negative for mouth sores.   Eyes:  Positive for dryness.  Respiratory:  Positive for shortness of breath.   Cardiovascular:  Positive for chest pain. Negative for palpitations.  Gastrointestinal:  Positive for diarrhea. Negative for blood in stool and constipation.  Endocrine: Positive for increased urination.  Genitourinary:  Negative for involuntary urination.  Musculoskeletal:  Positive for joint pain, gait problem, joint pain, myalgias, muscle weakness, morning stiffness, muscle tenderness and myalgias. Negative for joint swelling.  Skin:  Positive for color change. Negative for rash, hair loss and sensitivity to sunlight.  Allergic/Immunologic: Positive for susceptible to infections.  Neurological:  Positive for dizziness and headaches.  Hematological:  Negative for swollen glands.  Psychiatric/Behavioral:  Positive for depressed mood and sleep disturbance. The patient is nervous/anxious.     PMFS History:  Patient Active Problem List   Diagnosis Date Noted   Diarrhea 10/09/2023   Polycythemia 10/09/2023   Osteoarthritis of carpometacarpal [facility]) joint of right thumb 01/25/2023   Acute on chronic combined systolic and diastolic CHF (congestive heart failure) (HCC) 01/11/2023   Acute on chronic combined systolic (congestive) and diastolic (congestive) heart failure (HCC) 01/10/2023   CHF (congestive heart failure) (HCC) 01/08/2023   Acute on  chronic systolic CHF (congestive heart failure) (HCC) 12/21/2022   CKD stage 3a, GFR 45-59 ml/min (HCC) 12/21/2022   Chronic respiratory failure with hypoxia (HCC) - prn 2 L/min home O2 12/21/2022   Bilateral pleural effusion 12/21/2022   Increased frequency of urination 10/09/2022   Nevus of scalp 10/09/2022   Rheumatoid arthritis (HCC) 02/23/2022   High risk medication use 02/23/2022   COPD (chronic obstructive pulmonary disease) (HCC) 02/10/2022   Class 2 obesity 02/08/2022   DM2 (diabetes mellitus, type 2) (HCC) 02/04/2022   Nocturnal hypoxemia 12/30/2021   Non-ischemic cardiomyopathy (HCC) 07/29/2020   Postural dizziness with presyncope 02/18/2020   Ulcerative colitis, acute, unspecified complication (HCC) 02/18/2020   Thrombocytosis 02/12/2020   Sprain of left wrist 08/12/2019   Depression 04/05/2016   Kidney  stones 04/05/2016   Osteoarthritis 04/05/2016   OSA (obstructive sleep apnea) 04/05/2016   Bilateral carpal tunnel syndrome 12/02/2015   Chronic combined systolic and diastolic congestive heart failure (HCC) 04/13/2015   Orthostatic hypotension 01/19/2015   Obesity 11/26/2014   Enlarged prostate without lower urinary tract symptoms (luts) 10/29/2014   Essential hypertension 10/29/2014   GERD (gastroesophageal reflux disease) 10/29/2014   Panic attack 10/29/2014   Changing skin lesion 10/16/2014   Renal cyst 09/02/2014   Dyspnea on exertion 06/21/2014   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   Palpitations 06/11/2014   Myofascial pain 05/28/2014   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   Other cardiomyopathies (HCC) 04/29/2014   Chronic pain 02/12/2014   Lumbar radiculopathy 02/09/2014   Thrombocythemia 12/01/2013   Muscular wasting and disuse atrophy 08/15/2013   Degenerative lumbar spinal stenosis 08/06/2013   Lumbar and sacral osteoarthritis 05/19/2013   Congestive dilated cardiomyopathy (HCC) 01/11/2012   Back pain    Hemochromatosis    SOB (shortness of  breath)    Gout    Polycythemia vera (HCC) 03/27/2011    Past Medical History:  Diagnosis Date   AICD (automatic cardioverter/defibrillator) present    Arthritis    AV block, 2nd degree 2015   St. Jude Allure Quadra pulse generator [id number], model PM 3242   Back pain    Cardiomyopathy (HCC)    Nonischemic 45%.    CHF (congestive heart failure) (HCC)    Diabetes mellitus without complication (HCC)    Fracture of lumbar vertebra without spinal cord injury, sequela 10/09/2022   Gout    Hemochromatosis    History of kidney stones    Hypertension    Hypospadias [birth year]   born with   Nephrolithiasis    Pacemaker lead malfunction-elevated threshold RV lead 07/31/2014   Polycythemia vera(238.4)    Presence of permanent cardiac pacemaker     Family History  Problem Relation Age of Onset   Stroke Mother    Other Father        Deceased, car fell on him   Diabetes Sister    Hypertension Sister    Heart disease Maternal Grandmother        Pacemaker, MI   Past Surgical History:  Procedure Laterality Date   ANKLE SURGERY     BAROREFLEX SYSTEM INSERTION  10/04/2023   BI-VENTRICULAR PACEMAKER INSERTION N/A 04/29/2014   Procedure: BI-VENTRICULAR PACEMAKER INSERTION (CRT-P);  Surgeon: Verona Goodwill, MD; Laterality: Left  St. Jude Allure Quadra pulse generator [id number], model [place] 4540   CARDIAC SURGERY     COLONOSCOPY N/A 02/15/2020   Procedure: COLONOSCOPY;  Surgeon: Genell Ken, MD;  Location: [facility] ENDOSCOPY;  Service: Gastroenterology;  Laterality: N/A;   ESOPHAGOGASTRODUODENOSCOPY (EGD) WITH PROPOFOL  N/A 02/15/2020   Procedure: ESOPHAGOGASTRODUODENOSCOPY (EGD) WITH PROPOFOL ;  Surgeon: Genell Ken, MD;  Location: [facility] ENDOSCOPY;  Service: Gastroenterology;  Laterality: N/A;   LEAD INSERTION N/A 04/28/2022   Procedure: LEAD INSERTION;  Surgeon: Verona Goodwill, MD;  Location: [facility] INVASIVE CV LAB;  Service: Cardiovascular;  Laterality: N/A;   PILONIDAL CYST EXCISION     POSTERIOR  LAMINECTOMY / DECOMPRESSION LUMBAR SPINE     PPM GENERATOR CHANGEOUT N/A 04/28/2022   Procedure: PPM GENERATOR CHANGEOUT;  Surgeon: Verona Goodwill, MD;  Location: [facility] INVASIVE CV LAB;  Service: Cardiovascular;  Laterality: N/A;   RIGHT HEART CATH N/A 08/09/2023   Procedure: RIGHT HEART CATH;  Surgeon: Lauralee Poll, MD;  Location: [facility] INVASIVE CV LAB;  Service: Cardiovascular;  Laterality: N/A;  RIGHT/LEFT HEART CATH AND CORONARY ANGIOGRAPHY N/A 01/10/2023   Procedure: RIGHT/LEFT HEART CATH AND CORONARY ANGIOGRAPHY;  Surgeon: Arleen Lacer, MD;  Location: [facility] INVASIVE CV LAB;  Service: Cardiovascular;  Laterality: N/A;   TONSILLECTOMY     Social History   Social History Narrative   Lives at home with wife in a one story home.  Has no children.  Does not work.  Getting workman's comp.  Education: 4 years trade school.    Immunization History  Administered Date(s) Administered   DTaP 01/26/2016   Fluad Quad(high Dose 65+) 03/07/2021, 03/02/2022   Influenza Split 01/21/2015   Influenza, High Dose Seasonal PF 02/23/2017, 03/22/2018, 01/10/2019, 03/01/2020   Influenza,inj,Quad PF,6+ Mos 01/21/2015, 01/27/2016, 02/05/2017   Influenza-Unspecified 01/26/2016   PFIZER Comirnaty (Gray Top)Covid-19 Tri-Sucrose Vaccine 09/10/2020   PFIZER(Purple Top)SARS-COV-2 Vaccination 06/21/2019, 07/13/2019, 04/16/2020, 09/10/2020   Pfizer Covid-19 Vaccine Bivalent Booster 15yrs & up 03/07/2021   Pfizer(Comirnaty )Fall Seasonal Vaccine 12 years and older 03/02/2022   Pneumococcal Conjugate-13 01/27/2016, 01/09/2018   Pneumococcal Polysaccharide-23 01/21/2015   Pneumococcal-Unspecified 01/26/2016   Tdap 01/26/2011, 01/27/2016, 08/09/2019   Zoster, Live 08/29/2019     Objective: Vital Signs: BP 104/65 (BP Location: Left Arm, Patient Position: Sitting, Cuff Size: Large)   Pulse 83   Resp 16   Ht 5' 10 (1.778 m)   Wt 234 lb (106.1 kg)   BMI 33.58 kg/m    Physical Exam Constitutional:       Appearance: He is obese.   Eyes:     Conjunctiva/sclera: Conjunctivae normal.    Cardiovascular:     Rate and Rhythm: Normal rate and regular rhythm.  Pulmonary:     Effort: Pulmonary effort is normal.     Breath sounds: Normal breath sounds.  Lymphadenopathy:     Cervical: No cervical adenopathy.   Skin:    General: Skin is warm and dry.   Neurological:     Mental Status: He is alert.   Psychiatric:        Mood and Affect: Mood normal.      Musculoskeletal Exam:  Neck full ROM no tenderness Shoulders restricted abduction and external rotation, right shoulder tenderness to pressure worst around bicipital groove, no palpable swelling Elbows full ROM no tenderness or swelling Wrists full ROM no tenderness or swelling Right thenar eminence and interosseus muscle atrophy, 1st CMC squaring and tenderness to pressure, heberdon's nodes Knees patellofemoral crepitus, no tenderness to pressure, large anterior bony nodule on left patella  Investigation: No additional findings.  Imaging: US  Abdomen Limited Result Date: 10/11/2023 CLINICAL DATA:  16109 Polycythemia 60454 EXAM: ULTRASOUND ABDOMEN LIMITED COMPARISON:  June 03, 2022 FINDINGS: Enlarged spleen, measuring 15.4 x 6.4 x 5.6 cm (280 mL). No visualized mass. Normal echogenicity. 2.7 cm cyst in the partially visualized left kidney. No ascites. IMPRESSION: Moderate splenomegaly, unchanged by my measurement. No splenic mass. Electronically Signed   By: Rance Burrows M.D.   On: 10/11/2023 11:54   DG CHEST PORT 1 VIEW Result Date: 10/10/2023 CLINICAL DATA:  Heart failure EXAM: PORTABLE CHEST 1 VIEW COMPARISON:  October 08, 2023 FINDINGS: Heart normal size. Bilateral pulmonary interstitial infiltrates correlate with congestive changes without significant pleural effusions No consolidations No change in the left subclavian pacemaker device No change in the right hemithorax stimulator pacer device with the electrode traveling to the lower  neck. IMPRESSION: Mild congestive changes.  Mild bilateral congestive changes Electronically Signed   By: Fredrich Jefferson M.D.   On: 10/10/2023 09:35   ECHOCARDIOGRAM COMPLETE  Result Date: 10/09/2023    ECHOCARDIOGRAM REPORT   Patient Name:   ABBOTT JASINSKI Date of Exam: 10/09/2023 Medical Rec #:  [id number]          Height:       70.0 in Accession #:    [id number]         Weight:       234.3 lb Date of Birth:  Aug 20, 1950           BSA:          2.233 m Patient Age:    73 years           BP:           107/47 mmHg Patient Gender: M                  HR:           105 bpm. Exam Location:  Inpatient Procedure: 2D Echo, Cardiac Doppler, Color Doppler and Intracardiac            Opacification Agent (Both Spectral and Color Flow Doppler were            utilized during procedure). Indications:    CHF  History:        Patient has prior history of Echocardiogram examinations, most                 recent 12/22/2022. Risk Factors:Hypertension.  Sonographer:    Janette Medley Referring Phys: [id number] CAROLE N HALL IMPRESSIONS  1. Left ventricular ejection fraction, by estimation, is 20 to 25%. The left ventricle has severely decreased function. The left ventricle demonstrates regional wall motion abnormalities (see scoring diagram/findings for description). The left ventricular internal cavity size was moderately dilated. Indeterminate diastolic filling due to E-A fusion.  2. Right ventricular systolic function is normal. The right ventricular size is normal. Tricuspid regurgitation signal is inadequate for assessing PA pressure.  3. Left atrial size was moderately dilated.  4. The mitral valve is normal in structure. Mild mitral valve regurgitation. No evidence of mitral stenosis.  5. The aortic valve is tricuspid. Aortic valve regurgitation is not visualized. No aortic stenosis is present.  6. The inferior vena cava is dilated in size with >50% respiratory variability, suggesting right atrial pressure of 8 mmHg. Comparison(s): No  significant change from prior study. Prior images reviewed side by side. FINDINGS  Left Ventricle: There is no left ventricular thrombus (Definity  contrast was used). There is global hypokinesis, but the inferior and inferolateral walss appear akinetic, consistent with infarction/scar in the right cooronary artery distribution. Left ventricular ejection fraction, by estimation, is 20 to 25%. The left ventricle has severely decreased function. The left ventricle demonstrates regional wall motion abnormalities. The left ventricular internal cavity size was moderately dilated. There is  no left ventricular hypertrophy. Indeterminate diastolic filling due to E-A fusion. Right Ventricle: The right ventricular size is normal. No increase in right ventricular wall thickness. Right ventricular systolic function is normal. Tricuspid regurgitation signal is inadequate for assessing PA pressure. Left Atrium: Left atrial size was moderately dilated. Right Atrium: Right atrial size was normal in size. Pericardium: There is no evidence of pericardial effusion. Mitral Valve: The mitral valve is normal in structure. Mild to moderate mitral annular calcification. Mild mitral valve regurgitation. No evidence of mitral valve stenosis. Tricuspid Valve: The tricuspid valve is normal in structure. Tricuspid valve regurgitation is trivial. Aortic Valve: The aortic valve is tricuspid. Aortic valve regurgitation is not visualized.  No aortic stenosis is present. Pulmonic Valve: The pulmonic valve was not well visualized. Pulmonic valve regurgitation is not visualized. No evidence of pulmonic stenosis. Aorta: The aortic root and ascending aorta are structurally normal, with no evidence of dilitation. Venous: The inferior vena cava is dilated in size with greater than 50% respiratory variability, suggesting right atrial pressure of 8 mmHg. IAS/Shunts: No atrial level shunt detected by color flow Doppler. Additional Comments: A device lead is  visualized in the right ventricle and right atrium.  LEFT VENTRICLE PLAX 2D LVIDd:         6.50 cm      Diastology LVIDs:         5.80 cm      LV e' medial:   4.57 cm/s LV PW:         0.90 cm      LV E/e' medial: 25.4 LV IVS:        1.10 cm LVOT diam:     2.30 cm LV SV:         79 LV SV Index:   35 LVOT Area:     4.15 cm  LV Volumes (MOD) LV vol d, MOD A2C: 270.0 ml LV vol d, MOD A4C: 207.0 ml LV vol s, MOD A2C: 177.0 ml LV vol s, MOD A4C: 131.0 ml LV SV MOD A2C:     93.0 ml LV SV MOD A4C:     207.0 ml LV SV MOD BP:      83.1 ml RIGHT VENTRICLE             IVC RV S prime:     16.00 cm/s  IVC diam: 2.40 cm TAPSE (M-mode): 2.9 cm LEFT ATRIUM             Index        RIGHT ATRIUM           Index LA diam:        4.10 cm 1.84 cm/m   RA Area:     17.90 cm LA Vol (A2C):   73.9 ml 33.10 ml/m  RA Volume:   48.60 ml  21.77 ml/m LA Vol (A4C):   66.5 ml 29.78 ml/m LA Biplane Vol: 71.9 ml 32.20 ml/m  AORTIC VALVE LVOT Vmax:   127.00 cm/s LVOT Vmean:  87.000 cm/s LVOT VTI:    0.189 m  AORTA Ao Root diam: 3.30 cm Ao Asc diam:  3.80 cm MITRAL VALVE MV Area (PHT): 4.41 cm     SHUNTS MV Decel Time: 172 msec     Systemic VTI:  0.19 m MV E velocity: 116.00 cm/s  Systemic Diam: 2.30 cm Karyl Paget Croitoru MD Electronically signed by Luana Rumple MD Signature Date/Time: 10/09/2023/4:01:55 PM    Final    DG Chest 2 View Result Date: 10/08/2023 CLINICAL DATA:  Shortness of breath EXAM: CHEST - 2 VIEW COMPARISON:  Chest radiograph dated 01/08/2023 FINDINGS: Lines/tubes: Left chest wall ICD leads project over the right atrium and ventricle and tributary of the coronary sinus. Right chest wall stimulator device with lead terminating above the field of view. Lungs: Mildly low lung volumes. Increased bibasilar interstitial and patchy opacities. Pleura: Trace blunting of bilateral costophrenic angles. No pneumothorax. Heart/mediastinum: Enlarged cardiomediastinal silhouette. Bones: No acute osseous abnormality. IMPRESSION: 1. Increased  bibasilar interstitial and patchy opacities, which may represent pulmonary edema or infection. 2. Trace blunting of bilateral costophrenic angles, which may represent trace pleural effusions. 3. Cardiomegaly. Electronically Signed   By: Limin  Xu M.D.   On: 10/08/2023 15:39    Recent Labs: Lab Results  Component Value Date   WBC 6.6 10/12/2023   HGB 10.0 (L) 10/12/2023   PLT 326 10/12/2023   NA 135 10/12/2023   K 4.2 10/12/2023   CL 106 10/12/2023   CO2 19 (L) 10/12/2023   GLUCOSE 136 (H) 10/12/2023   BUN 31 (H) 10/12/2023   CREATININE 1.37 (H) 10/12/2023   BILITOT 0.7 10/12/2023   ALKPHOS 59 10/12/2023   AST 11 (L) 10/12/2023   ALT 10 10/12/2023   PROT 6.3 (L) 10/12/2023   ALBUMIN  2.8 (L) 10/12/2023   CALCIUM  8.3 (L) 10/12/2023   GFRAA 83 05/10/2020    Speciality Comments: No specialty comments available.  Procedures:  Large Joint Inj: R glenohumeral on 10/18/2023 11:40 AM Indications: pain Details: 27 G 1.5 in needle, posterior approach Medications: 2 mL lidocaine  1 %; 40 mg triamcinolone  acetonide 40 MG/ML Outcome: tolerated well, no immediate complications Procedure, treatment alternatives, risks and benefits explained, specific risks discussed. Consent was given by the patient. Immediately prior to procedure a time out was called to verify the correct patient, procedure, equipment, support staff and site/side marked as required. Patient was prepped and draped in the usual sterile fashion.    Small Joint Inj: R thumb CMC on 10/18/2023 11:35 AM Indications: pain Details: 27 G needle, radial approach Medications: 0.25 mL lidocaine  1 %; 10 mg triamcinolone  acetonide 40 MG/ML Outcome: tolerated well, no immediate complications Procedure, treatment alternatives, risks and benefits explained, specific risks discussed. Consent was given by the patient. Immediately prior to procedure a time out was called to verify the correct patient, procedure, equipment, support staff and  site/side marked as required. Patient was prepped and draped in the usual sterile fashion.     Allergies: Advil [ibuprofen], Keflex [cephalexin], Wellbutrin [bupropion], Prednisone , Temazepam , Trazodone  and nefazodone, Entresto  [sacubitril -valsartan ], and Prozac [fluoxetine hcl]   Assessment / Plan:     Visit Diagnoses: Idiopathic chronic gout of multiple sites without tophus - 10/09/2022 Uric Acid 4.9 Gout appears well-controlled with no new acute flares.  On allopurinol  300 mg daily without new changes. No kidney stones. Continued cardiology optimization -Continue allopurinol  300 mg daily  Hand cramps Intermittent hand cramping and pain, particularly in the right hand, associated with osteoarthritis and muscle atrophy. Likely due to a combination of arthritis and muscle atrophy, leading to overuse-type cramps. - Printed hand exercises. 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 Limited treatment options due to cardiac comorbidities. Steroid injections provide temporary relief. - Administer steroid injection to 1st CMC joint.  Arthritis of shoulder Chronic shoulder arthritis with limited medication options due to cardiac comorbidities. - Right GH injection   Orders: Orders Placed This Encounter  Procedures   Large Joint Inj   Small Joint Inj   Ambulatory referral to Occupational Therapy   No orders of the defined types were placed in this encounter.    Follow-Up Instructions: Return in about 3 months (around 01/18/2024) for Gout/OA on allopurinol /inj f/u 3mos.   Matt Song, MD  Note - This record has been created using [employer].  Chart creation errors have been sought, but may not always  have been located. Such creation errors do not reflect on  the standard of medical care.

## 2023-10-04 NOTE — Transfer of Care (Signed)
 Immediate Anesthesia Transfer of Care Note  Patient: William Villanueva  Procedure(s) Performed: INSERTION, CAROTID SINUS BAROREFLEX ACTIVATION DEVICE (Right: Neck)  Patient Location: PACU  Anesthesia Type:General  Level of Consciousness: awake and alert   Airway & Oxygen  Therapy: Patient Spontanous Breathing and Patient connected to face mask oxygen   Post-op Assessment: Report given to RN and Post -op Vital signs reviewed and stable  Post vital signs: Reviewed and stable  Last Vitals:  Vitals Value Taken Time  BP 112/72 10/04/23 1145  Temp    Pulse 91 10/04/23 1151  Resp 19 10/04/23 1151  SpO2 93 % 10/04/23 1151  Vitals shown include unfiled device data.  Last Pain:  Vitals:   10/04/23 0834  TempSrc:   PainSc: 0-No pain         Complications: No notable events documented.

## 2023-10-04 NOTE — Anesthesia Procedure Notes (Addendum)
 Procedure Name: Intubation Date/Time: 10/04/2023 10:16 AM  Performed by: Kham Zuckerman, CRNAPre-anesthesia Checklist: Patient identified, Emergency Drugs available, Suction available and Patient being monitored Patient Re-evaluated:Patient Re-evaluated prior to induction Oxygen  Delivery Method: Circle System Utilized Preoxygenation: Pre-oxygenation with 100% oxygen  Induction Type: IV induction Ventilation: Mask ventilation without difficulty Laryngoscope Size: Mac and 4 Grade View: Grade II Tube type: Oral Tube size: 7.5 mm Number of attempts: 1 Airway Equipment and Method: Stylet and Oral airway Placement Confirmation: ETT inserted through vocal cords under direct vision, positive ETCO2 and breath sounds checked- equal and bilateral Secured at: 23 cm Tube secured with: Tape Dental Injury: Teeth and Oropharynx as per pre-operative assessment  Comments: Teeth, lips, and tongue unchanged. Head and neck midline. ETT taped to left side.

## 2023-10-08 ENCOUNTER — Inpatient Hospital Stay (HOSPITAL_BASED_OUTPATIENT_CLINIC_OR_DEPARTMENT_OTHER)
Admission: EM | Admit: 2023-10-08 | Discharge: 2023-10-12 | DRG: 291 | Disposition: A | Discharge status: Home / Self Care | Attending: Internal Medicine | Admitting: Internal Medicine

## 2023-10-08 ENCOUNTER — Encounter (HOSPITAL_BASED_OUTPATIENT_CLINIC_OR_DEPARTMENT_OTHER): Payer: Self-pay | Admitting: Emergency Medicine

## 2023-10-08 ENCOUNTER — Emergency Department (HOSPITAL_BASED_OUTPATIENT_CLINIC_OR_DEPARTMENT_OTHER)

## 2023-10-08 ENCOUNTER — Other Ambulatory Visit: Payer: Self-pay

## 2023-10-08 ENCOUNTER — Telehealth: Payer: Self-pay

## 2023-10-08 DIAGNOSIS — E872 Acidosis, unspecified: Secondary | ICD-10-CM | POA: Diagnosis not present

## 2023-10-08 DIAGNOSIS — I110 Hypertensive heart disease with heart failure: Secondary | ICD-10-CM | POA: Diagnosis not present

## 2023-10-08 DIAGNOSIS — Z87442 Personal history of urinary calculi: Secondary | ICD-10-CM

## 2023-10-08 DIAGNOSIS — K219 Gastro-esophageal reflux disease without esophagitis: Secondary | ICD-10-CM | POA: Diagnosis present

## 2023-10-08 DIAGNOSIS — M1990 Unspecified osteoarthritis, unspecified site: Secondary | ICD-10-CM | POA: Diagnosis present

## 2023-10-08 DIAGNOSIS — R0902 Hypoxemia: Secondary | ICD-10-CM | POA: Diagnosis not present

## 2023-10-08 DIAGNOSIS — Z8249 Family history of ischemic heart disease and other diseases of the circulatory system: Secondary | ICD-10-CM

## 2023-10-08 DIAGNOSIS — I2510 Atherosclerotic heart disease of native coronary artery without angina pectoris: Secondary | ICD-10-CM | POA: Diagnosis not present

## 2023-10-08 DIAGNOSIS — M545 Low back pain, unspecified: Secondary | ICD-10-CM | POA: Diagnosis present

## 2023-10-08 DIAGNOSIS — D751 Secondary polycythemia: Secondary | ICD-10-CM | POA: Diagnosis not present

## 2023-10-08 DIAGNOSIS — Z7951 Long term (current) use of inhaled steroids: Secondary | ICD-10-CM

## 2023-10-08 DIAGNOSIS — J449 Chronic obstructive pulmonary disease, unspecified: Secondary | ICD-10-CM | POA: Diagnosis present

## 2023-10-08 DIAGNOSIS — E119 Type 2 diabetes mellitus without complications: Secondary | ICD-10-CM

## 2023-10-08 DIAGNOSIS — R197 Diarrhea, unspecified: Secondary | ICD-10-CM

## 2023-10-08 DIAGNOSIS — E6609 Other obesity due to excess calories: Secondary | ICD-10-CM | POA: Diagnosis not present

## 2023-10-08 DIAGNOSIS — Z95810 Presence of automatic (implantable) cardiac defibrillator: Secondary | ICD-10-CM

## 2023-10-08 DIAGNOSIS — Z8711 Personal history of peptic ulcer disease: Secondary | ICD-10-CM

## 2023-10-08 DIAGNOSIS — E876 Hypokalemia: Secondary | ICD-10-CM | POA: Diagnosis not present

## 2023-10-08 DIAGNOSIS — E669 Obesity, unspecified: Secondary | ICD-10-CM | POA: Diagnosis present

## 2023-10-08 DIAGNOSIS — I5043 Acute on chronic combined systolic (congestive) and diastolic (congestive) heart failure: Secondary | ICD-10-CM | POA: Diagnosis not present

## 2023-10-08 DIAGNOSIS — I509 Heart failure, unspecified: Secondary | ICD-10-CM | POA: Diagnosis not present

## 2023-10-08 DIAGNOSIS — R0602 Shortness of breath: Secondary | ICD-10-CM | POA: Diagnosis not present

## 2023-10-08 DIAGNOSIS — E785 Hyperlipidemia, unspecified: Secondary | ICD-10-CM | POA: Diagnosis not present

## 2023-10-08 DIAGNOSIS — N200 Calculus of kidney: Secondary | ICD-10-CM

## 2023-10-08 DIAGNOSIS — N1831 Chronic kidney disease, stage 3a: Secondary | ICD-10-CM | POA: Diagnosis not present

## 2023-10-08 DIAGNOSIS — R627 Adult failure to thrive: Secondary | ICD-10-CM | POA: Diagnosis not present

## 2023-10-08 DIAGNOSIS — I447 Left bundle-branch block, unspecified: Secondary | ICD-10-CM | POA: Diagnosis not present

## 2023-10-08 DIAGNOSIS — Z79899 Other long term (current) drug therapy: Secondary | ICD-10-CM

## 2023-10-08 DIAGNOSIS — D62 Acute posthemorrhagic anemia: Secondary | ICD-10-CM | POA: Diagnosis present

## 2023-10-08 DIAGNOSIS — D7589 Other specified diseases of blood and blood-forming organs: Secondary | ICD-10-CM | POA: Diagnosis present

## 2023-10-08 DIAGNOSIS — I5023 Acute on chronic systolic (congestive) heart failure: Secondary | ICD-10-CM | POA: Diagnosis not present

## 2023-10-08 DIAGNOSIS — I5084 End stage heart failure: Secondary | ICD-10-CM | POA: Diagnosis not present

## 2023-10-08 DIAGNOSIS — I130 Hypertensive heart and chronic kidney disease with heart failure and stage 1 through stage 4 chronic kidney disease, or unspecified chronic kidney disease: Secondary | ICD-10-CM | POA: Diagnosis not present

## 2023-10-08 DIAGNOSIS — Z1152 Encounter for screening for COVID-19: Secondary | ICD-10-CM

## 2023-10-08 DIAGNOSIS — R000 Tachycardia, unspecified: Secondary | ICD-10-CM | POA: Diagnosis present

## 2023-10-08 DIAGNOSIS — N179 Acute kidney failure, unspecified: Secondary | ICD-10-CM | POA: Diagnosis not present

## 2023-10-08 DIAGNOSIS — E66811 Obesity, class 1: Secondary | ICD-10-CM | POA: Diagnosis present

## 2023-10-08 DIAGNOSIS — I10 Essential (primary) hypertension: Secondary | ICD-10-CM | POA: Diagnosis not present

## 2023-10-08 DIAGNOSIS — K2100 Gastro-esophageal reflux disease with esophagitis, without bleeding: Secondary | ICD-10-CM | POA: Diagnosis not present

## 2023-10-08 DIAGNOSIS — I493 Ventricular premature depolarization: Secondary | ICD-10-CM | POA: Diagnosis present

## 2023-10-08 DIAGNOSIS — I442 Atrioventricular block, complete: Secondary | ICD-10-CM | POA: Diagnosis not present

## 2023-10-08 DIAGNOSIS — D45 Polycythemia vera: Secondary | ICD-10-CM | POA: Diagnosis present

## 2023-10-08 DIAGNOSIS — Z794 Long term (current) use of insulin: Secondary | ICD-10-CM

## 2023-10-08 DIAGNOSIS — D631 Anemia in chronic kidney disease: Secondary | ICD-10-CM | POA: Diagnosis not present

## 2023-10-08 DIAGNOSIS — E861 Hypovolemia: Secondary | ICD-10-CM | POA: Diagnosis present

## 2023-10-08 DIAGNOSIS — R0989 Other specified symptoms and signs involving the circulatory and respiratory systems: Secondary | ICD-10-CM | POA: Diagnosis not present

## 2023-10-08 DIAGNOSIS — A084 Viral intestinal infection, unspecified: Secondary | ICD-10-CM | POA: Diagnosis present

## 2023-10-08 DIAGNOSIS — E1122 Type 2 diabetes mellitus with diabetic chronic kidney disease: Secondary | ICD-10-CM | POA: Diagnosis present

## 2023-10-08 DIAGNOSIS — Z886 Allergy status to analgesic agent status: Secondary | ICD-10-CM

## 2023-10-08 DIAGNOSIS — I428 Other cardiomyopathies: Secondary | ICD-10-CM | POA: Diagnosis not present

## 2023-10-08 DIAGNOSIS — Z833 Family history of diabetes mellitus: Secondary | ICD-10-CM

## 2023-10-08 DIAGNOSIS — R918 Other nonspecific abnormal finding of lung field: Secondary | ICD-10-CM | POA: Diagnosis not present

## 2023-10-08 DIAGNOSIS — I959 Hypotension, unspecified: Secondary | ICD-10-CM | POA: Diagnosis present

## 2023-10-08 DIAGNOSIS — Z87891 Personal history of nicotine dependence: Secondary | ICD-10-CM

## 2023-10-08 DIAGNOSIS — Z6833 Body mass index (BMI) 33.0-33.9, adult: Secondary | ICD-10-CM

## 2023-10-08 DIAGNOSIS — Z881 Allergy status to other antibiotic agents status: Secondary | ICD-10-CM

## 2023-10-08 DIAGNOSIS — Z888 Allergy status to other drugs, medicaments and biological substances status: Secondary | ICD-10-CM

## 2023-10-08 DIAGNOSIS — R161 Splenomegaly, not elsewhere classified: Secondary | ICD-10-CM | POA: Diagnosis present

## 2023-10-08 DIAGNOSIS — I5042 Chronic combined systolic (congestive) and diastolic (congestive) heart failure: Secondary | ICD-10-CM

## 2023-10-08 DIAGNOSIS — G4733 Obstructive sleep apnea (adult) (pediatric): Secondary | ICD-10-CM | POA: Diagnosis present

## 2023-10-08 LAB — HEPATIC FUNCTION PANEL
ALT: 11 U/L (ref 0–44)
AST: 17 U/L (ref 15–41)
Albumin: 3.8 g/dL (ref 3.5–5.0)
Alkaline Phosphatase: 82 U/L (ref 38–126)
Bilirubin, Direct: 0.3 mg/dL — ABNORMAL HIGH (ref 0.0–0.2)
Indirect Bilirubin: 0.3 mg/dL (ref 0.3–0.9)
Total Bilirubin: 0.6 mg/dL (ref 0.0–1.2)
Total Protein: 6.7 g/dL (ref 6.5–8.1)

## 2023-10-08 LAB — BASIC METABOLIC PANEL WITH GFR
Anion gap: 13 (ref 5–15)
BUN: 24 mg/dL — ABNORMAL HIGH (ref 8–23)
CO2: 19 mmol/L — ABNORMAL LOW (ref 22–32)
Calcium: 8.4 mg/dL — ABNORMAL LOW (ref 8.9–10.3)
Chloride: 107 mmol/L (ref 98–111)
Creatinine, Ser: 0.94 mg/dL (ref 0.61–1.24)
GFR, Estimated: >60 mL/min (ref >60)
Glucose, Bld: 113 mg/dL — ABNORMAL HIGH (ref 70–99)
Potassium: 3.9 mmol/L (ref 3.5–5.1)
Sodium: 140 mmol/L (ref 135–145)

## 2023-10-08 LAB — CBC
HCT: 26.4 % — ABNORMAL LOW (ref 39.0–52.0)
Hemoglobin: 8.5 g/dL — ABNORMAL LOW (ref 13.0–17.0)
MCH: 33.2 pg (ref 26.0–34.0)
MCHC: 32.2 g/dL (ref 30.0–36.0)
MCV: 103.1 fL — ABNORMAL HIGH (ref 80.0–100.0)
Platelets: 260 10*3/uL (ref 150–400)
RBC: 2.56 MIL/uL — ABNORMAL LOW (ref 4.22–5.81)
RDW: 18.7 % — ABNORMAL HIGH (ref 11.5–15.5)
WBC: 6.5 10*3/uL (ref 4.0–10.5)
nRBC: 0 % (ref 0.0–0.2)

## 2023-10-08 LAB — PRO BRAIN NATRIURETIC PEPTIDE: Pro Brain Natriuretic Peptide: 17141 pg/mL — ABNORMAL HIGH (ref <300.0)

## 2023-10-08 LAB — RESP PANEL BY RT-PCR (RSV, FLU A&B, COVID)  RVPGX2
Influenza A by PCR: NEGATIVE
Influenza B by PCR: NEGATIVE
Resp Syncytial Virus by PCR: NEGATIVE
SARS Coronavirus 2 by RT PCR: NEGATIVE

## 2023-10-08 LAB — TROPONIN T, HIGH SENSITIVITY
Troponin T High Sensitivity: 100 ng/L (ref <19)
Troponin T High Sensitivity: 95 ng/L — ABNORMAL HIGH (ref <19)

## 2023-10-08 MED ORDER — DOXYCYCLINE HYCLATE 100 MG PO TABS
100.0000 mg | ORAL_TABLET | Freq: Once | ORAL | Status: AC
  Administered 2023-10-08: 100 mg via ORAL
  Filled 2023-10-08: qty 1

## 2023-10-08 MED ORDER — SODIUM CHLORIDE 0.9 % IV SOLN
2.0000 g | Freq: Once | INTRAVENOUS | Status: AC
  Administered 2023-10-08: 2 g via INTRAVENOUS
  Filled 2023-10-08: qty 20

## 2023-10-08 MED ORDER — ONDANSETRON HCL 4 MG/2ML IJ SOLN
4.0000 mg | Freq: Once | INTRAMUSCULAR | Status: AC
  Administered 2023-10-08: 4 mg via INTRAVENOUS
  Filled 2023-10-08: qty 2

## 2023-10-08 MED ORDER — FUROSEMIDE 10 MG/ML IJ SOLN
40.0000 mg | Freq: Once | INTRAMUSCULAR | Status: AC
  Administered 2023-10-08: 40 mg via INTRAVENOUS
  Filled 2023-10-08: qty 4

## 2023-10-08 MED ORDER — HYDROCODONE-ACETAMINOPHEN 5-325 MG PO TABS
1.0000 | ORAL_TABLET | Freq: Four times a day (QID) | ORAL | Status: DC | PRN
  Administered 2023-10-08 – 2023-10-10 (×2): 1 via ORAL
  Filled 2023-10-08 (×2): qty 1

## 2023-10-08 NOTE — ED Notes (Signed)
Pt placed on 2L Nc

## 2023-10-08 NOTE — ED Notes (Signed)
 [facility] called for transport.

## 2023-10-08 NOTE — ED Notes (Addendum)
 Pt presents with n/v/d  for a few days stools have been gray pt states   states had a carotid sinus baroflex activation device placed on the 5/29

## 2023-10-08 NOTE — Plan of Care (Signed)
 73 year old M with PMH of combined CHF/AICD and recent Barostim device on 5/29, AVB s/p PPM, COPD, RF on 2 L, OSA, polycythemia vera on Jakafi , DM-2, CKD-3A, BPH, GERD, RA, UC and radiculopathy presented to [facility] ED with multiple complaints including SOB, cough, nausea, vomiting and diarrhea.  In ED, mild temp, tachycardia, tachypnea and diastolic hypotension.  Saturation stable on home 2 L.  CO2 19.  AG 13.  WBC 6.5.  Hgb 8.5 (was 10.2 on 5/29).  BNP 17,000.  CXR with interstitial opacity, trace pleural effusion and cardiomegaly.  Troponin 100.  EKG A-sensed V-paced rhythm with PVC and LBBB.  COVID-19, influenza and RSV PCR nonreactive.  EDP discussed case with on-call vascular surgery, Dr. Susi Eric who felt primary problem to be CHF and suggested consult as needed.  Started on Lasix , CTX and doxycycline .  Admission requested for CHF exacerbation.  Advised EDP to obtain blood cultures given recent procedure.  Accepted to progressive care.

## 2023-10-08 NOTE — ED Triage Notes (Signed)
 Pt POV in wheelchair- recent cardiac implant on Thursday, now reports diarrhea and cough x2 days. Emesis x1 day.  Denies fever.

## 2023-10-08 NOTE — ED Provider Notes (Signed)
 73 year old male with advanced CHF presenting to the emergency department today with shortness of breath and cough as well as some leg swelling after he had a Barostim device placed a few days ago.  The remainder of his labs and chest x-ray read are pending at the time of signout.  The patient was satting at 85% on room air initially.  He is placed on 2 L nasal cannula and is now 97%. Physical Exam  BP 130/82   Pulse 92   Temp 98.3 F (36.8 C) (Oral)   Resp 20   Ht 5\' 10"  (1.778 m)   Wt 104.3 kg   SpO2 (S) (!) 85%   BMI 33.00 kg/m   Physical Exam General: No acute distress although there is some mild conversational dyspnea Respiratory: Rales at bilateral lung bases Skin: There are some bruising noted over the right neck as well as the right chest but no erythema or drainage noted Procedures  Procedures  ED Course / MDM    Medical Decision Making Amount and/or Complexity of Data Reviewed Labs: ordered. Radiology: ordered.  Risk Prescription drug management.   73 year old male presenting to the emergency department today with what appears to be CHF exacerbation based on his x-ray.  His troponin and BNP are significantly elevated so I do suspect that this along with the rales on physical exam that this is due to CHF exacerbation.  Will call and discussed his case with vascular surgery as well as the hospitalist service for admission as he is requiring oxygen .  The patient's renal function is at baseline.  Lasix  is ordered.  The patient states he has coughed up some phlegm over the past few days and although suspicion for pneumonia is lower I will cover him with antibiotics as he was recently intubated for the procedure.       Carin Charleston, MD 10/08/23 [phone]

## 2023-10-08 NOTE — ED Provider Notes (Signed)
 [place] [facility] Provider Note   CSN: [id number] Arrival date & time: 10/08/23  1302     History  Chief Complaint  Patient presents with   Post-op Problem    William Villanueva is a 73 y.o. male.  HPI Patient presents with multiple complaints.  Shortness of breath diarrhea cough.  Said for around 2 days.  Had recent Barostim carotid sinus stimulator placed by Dr. Charlotte Cookey.  Now feeling worse.  Also history of CHF.   Past Medical History:  Diagnosis Date   AICD (automatic cardioverter/defibrillator) present    Arthritis    AV block, 2nd degree 2015   [employer] Jude Allure Quadra pulse generator [id number], model PM 3242   Back pain    Cardiomyopathy (HCC)    Nonischemic 45%.    CHF (congestive heart failure) (HCC)    Diabetes mellitus without complication (HCC)    Fracture of lumbar vertebra without spinal cord injury, sequela 10/09/2022   Gout    Hemochromatosis    History of kidney stones    Hypertension    Hypospadias 10-22-1950   born with   Nephrolithiasis    Pacemaker lead malfunction-elevated threshold RV lead 07/31/2014   Polycythemia vera(238.4)    Presence of permanent cardiac pacemaker    Past Surgical History:  Procedure Laterality Date   ANKLE SURGERY     BI-VENTRICULAR PACEMAKER INSERTION N/A 04/29/2014   Procedure: BI-VENTRICULAR PACEMAKER INSERTION (CRT-P);  Surgeon: Verona Goodwill, MD; Laterality: Left  St. Jude Allure Quadra pulse generator [phone], model [place] 1478   CARDIAC SURGERY     COLONOSCOPY N/A 02/15/2020   Procedure: COLONOSCOPY;  Surgeon: Genell Ken, MD;  Location: [facility] ENDOSCOPY;  Service: Gastroenterology;  Laterality: N/A;   ESOPHAGOGASTRODUODENOSCOPY (EGD) WITH PROPOFOL  N/A 02/15/2020   Procedure: ESOPHAGOGASTRODUODENOSCOPY (EGD) WITH PROPOFOL ;  Surgeon: Genell Ken, MD;  Location: [facility] ENDOSCOPY;  Service: Gastroenterology;  Laterality: N/A;   LEAD INSERTION N/A 04/28/2022   Procedure: LEAD INSERTION;   Surgeon: Verona Goodwill, MD;  Location: [facility] INVASIVE CV LAB;  Service: Cardiovascular;  Laterality: N/A;   PILONIDAL CYST EXCISION     POSTERIOR LAMINECTOMY / DECOMPRESSION LUMBAR SPINE     PPM GENERATOR CHANGEOUT N/A 04/28/2022   Procedure: PPM GENERATOR CHANGEOUT;  Surgeon: Verona Goodwill, MD;  Location: [facility] INVASIVE CV LAB;  Service: Cardiovascular;  Laterality: N/A;   RIGHT HEART CATH N/A 08/09/2023   Procedure: RIGHT HEART CATH;  Surgeon: Lauralee Poll, MD;  Location: [facility] INVASIVE CV LAB;  Service: Cardiovascular;  Laterality: N/A;   RIGHT/LEFT HEART CATH AND CORONARY ANGIOGRAPHY N/A 01/10/2023   Procedure: RIGHT/LEFT HEART CATH AND CORONARY ANGIOGRAPHY;  Surgeon: Arleen Lacer, MD;  Location: [facility] INVASIVE CV LAB;  Service: Cardiovascular;  Laterality: N/A;   TONSILLECTOMY       Home Medications Prior to Admission medications   Medication Sig Start Date End Date Taking? Authorizing Provider  acetaminophen  (TYLENOL ) 500 MG tablet Take 1,000 mg by mouth every 6 (six) hours as needed for headache.    [provider]  allopurinol  (ZYLOPRIM ) 300 MG tablet Take 1 tablet (300 mg total) by mouth daily. 04/18/23   Rice, Haig Levan, MD  Ascorbic Acid (VITAMIN C PO) Take 1 tablet by mouth daily.    [provider]  budesonide  (PULMICORT ) 0.5 MG/2ML nebulizer solution Take 2 mLs (0.5 mg total) by nebulization 2 (two) times daily. (RINSE MOUTH AFTER USE) Patient taking differently: Take 2 mLs by nebulization daily. 09/28/22  Diamond Formica, MD  cholestyramine  (QUESTRAN ) 4 GM/DOSE powder Take 4 g by mouth daily as needed (diarrhea). 03/22/22   [provider]  clotrimazole (LOTRIMIN) 1 % cream Apply 1 Application topically daily as needed (irritation). 06/25/23   [provider]  colestipol  (COLESTID ) 1 g tablet Take 2 g by mouth daily.    [provider]  Cyanocobalamin  (B-12 PO) Take 1 tablet by mouth daily.    [provider]  diclofenac   Sodium (VOLTAREN ) 1 % GEL Apply 2 g topically daily as needed (pain). Patient not taking: Reported on 09/26/2023    [provider]  dicyclomine  (BENTYL ) 20 MG tablet Take 20 mg by mouth 2 (two) times daily. 03/28/22   [provider]  fluorouracil (EFUDEX) 5 % cream 1 Application daily. Patient not taking: Reported on 09/26/2023 09/07/20   [provider]  HYDROcodone -acetaminophen  (NORCO/VICODIN) 5-325 MG tablet Take 1 tablet by mouth every 6 (six) hours as needed for moderate pain (pain score 4-6). 10/04/23   Cordie Deters, PA-C  insulin  degludec (TRESIBA FLEXTOUCH) 100 UNIT/ML SOPN FlexTouch Pen Inject 16 Units into the skin daily after breakfast.    [provider]  losartan  (COZAAR ) 25 MG tablet TAKE 1/2 TABLET BY MOUTH DAILY 03/23/23   Lenise Quince, MD  magnesium  gluconate (MAGONATE) 500 MG tablet Take 500 mg by mouth daily.    [provider]  metoprolol  succinate (TOPROL  XL) 25 MG 24 hr tablet Take 0.5 tablets (12.5 mg total) by mouth daily. 05/23/23   Conan December, PA-C  omeprazole  (PRILOSEC) 20 MG capsule Take 20 mg by mouth in the morning and at bedtime.    [provider]  oxybutynin  (DITROPAN  XL) 15 MG 24 hr tablet Take 15 mg by mouth in the morning. 02/06/22   [provider]  ruxolitinib  phosphate (JAKAFI ) 10 MG tablet Take 1 tablet (10 mg total) by mouth 2 (two) times daily. 08/02/23   Ivor Mars, MD  sodium bicarbonate  650 MG tablet Take 1 tablet (650 mg total) by mouth 2 (two) times daily. 12/24/22   Ezzard Holms, MD  tamsulosin  (FLOMAX ) 0.4 MG CAPS capsule Take 0.4 mg by mouth daily. 08/03/17   [provider]  torsemide  (DEMADEX ) 20 MG tablet Take 40 mg by mouth 2 (two) times daily.    [provider]      Allergies    Advil [ibuprofen], Keflex [cephalexin], Wellbutrin [bupropion], Prednisone , Temazepam , Trazodone  and nefazodone, Entresto  [sacubitril -valsartan ], and Prozac [fluoxetine  hcl]    Review of Systems   Review of Systems  Physical Exam Updated Vital Signs BP 130/82   Pulse 92   Temp 98.3 F (36.8 C) (Oral)   Resp 20   Ht 5\' 10"  (1.778 m)   Wt 104.3 kg   SpO2 95%   BMI 33.00 kg/m  Physical Exam Vitals reviewed.  HENT:     Head: Normocephalic.  Cardiovascular:     Rate and Rhythm: Normal rate.  Pulmonary:     Comments: Somewhat harsh breath sounds.  Some ecchymosis on the right neck and healing wound on right upper chest.  Pacemaker/AICD left upper chest Abdominal:     Tenderness: There is no abdominal tenderness.  Musculoskeletal:     Right lower leg: Edema present.     Left lower leg: Edema present.  Skin:    General: Skin is warm.  Neurological:     Mental Status: He is alert and oriented to person, place, and time.  ED Results / Procedures / Treatments   Labs (all labs ordered are listed, but only abnormal results are displayed) Labs Reviewed  CBC - Abnormal; Notable for the following components:      Result Value   RBC 2.56 (*)    Hemoglobin 8.5 (*)    HCT 26.4 (*)    MCV 103.1 (*)    RDW 18.7 (*)    All other components within normal limits  RESP PANEL BY RT-PCR (RSV, FLU A&B, COVID)  RVPGX2  BASIC METABOLIC PANEL WITH GFR  PRO BRAIN NATRIURETIC PEPTIDE  HEPATIC FUNCTION PANEL  TROPONIN T, HIGH SENSITIVITY    EKG EKG Interpretation Date/Time:  Monday October 08 2023 13:38:29 EDT Ventricular Rate:  97 PR Interval:  147 QRS Duration:  149 QT Interval:  418 QTC Calculation: 531 R Axis:   -71  Text Interpretation: duplicate Confirmed by Mozell Arias [phone]) on 10/08/2023 1:44:37 PM  Radiology No results found.  Procedures Procedures    Medications Ordered in ED Medications  ondansetron  (ZOFRAN ) injection 4 mg (has no administration in time range)    ED Course/ Medical Decision Making/ A&P                                 Medical Decision Making Amount and/or Complexity of Data Reviewed Labs:  ordered. Radiology: ordered.  Risk Prescription drug management.   Patient with various complaints.  Nausea vomiting diarrhea cough.  Recent Barostim.  Did have hypoxia in the ER.  Sats in the 80s and oxygen  placed.  Does have edema on his legs and harsh breath sounds.  Will get x-ray.  Will get basic blood work.  Will get BNP.   Has a anemia with hemoglobin 8.5.  Down from 10.212 days ago.  History of polycythemia.  Lab work and x-ray pending otherwise.  Care turned over to Dr. Charlee Conine        Final Clinical Impression(s) / ED Diagnoses Final diagnoses:  None    Rx / DC Orders ED Discharge Orders     None         Mozell Arias, MD 10/08/23 1442

## 2023-10-08 NOTE — ED Notes (Signed)
 Pt had a 18 beat run of v.tach. Dr. Charlee Conine notified. RN to room to check on pt and pt in stretcher without complaints at this time. Call light within reach.

## 2023-10-08 NOTE — Telephone Encounter (Signed)
 Pt's wife called reporting patient having diarrhea and dyspnea at rest and with exertion following right-sided Barostim placement on 10/04/23.  Returned call to wife to advise her to go the ED. Pt's wife stated they were on the way to the ED [facility] [address] Pt's wife advised to go to [facility] ED.

## 2023-10-09 ENCOUNTER — Inpatient Hospital Stay (HOSPITAL_COMMUNITY)

## 2023-10-09 DIAGNOSIS — R197 Diarrhea, unspecified: Secondary | ICD-10-CM | POA: Diagnosis not present

## 2023-10-09 DIAGNOSIS — I428 Other cardiomyopathies: Secondary | ICD-10-CM | POA: Diagnosis present

## 2023-10-09 DIAGNOSIS — I10 Essential (primary) hypertension: Secondary | ICD-10-CM | POA: Diagnosis not present

## 2023-10-09 DIAGNOSIS — R0989 Other specified symptoms and signs involving the circulatory and respiratory systems: Secondary | ICD-10-CM | POA: Diagnosis not present

## 2023-10-09 DIAGNOSIS — N1831 Chronic kidney disease, stage 3a: Secondary | ICD-10-CM

## 2023-10-09 DIAGNOSIS — R627 Adult failure to thrive: Secondary | ICD-10-CM | POA: Diagnosis present

## 2023-10-09 DIAGNOSIS — I5023 Acute on chronic systolic (congestive) heart failure: Secondary | ICD-10-CM

## 2023-10-09 DIAGNOSIS — D45 Polycythemia vera: Secondary | ICD-10-CM | POA: Diagnosis present

## 2023-10-09 DIAGNOSIS — R161 Splenomegaly, not elsewhere classified: Secondary | ICD-10-CM | POA: Diagnosis not present

## 2023-10-09 DIAGNOSIS — I5043 Acute on chronic combined systolic (congestive) and diastolic (congestive) heart failure: Secondary | ICD-10-CM | POA: Diagnosis not present

## 2023-10-09 DIAGNOSIS — I442 Atrioventricular block, complete: Secondary | ICD-10-CM | POA: Diagnosis present

## 2023-10-09 DIAGNOSIS — D631 Anemia in chronic kidney disease: Secondary | ICD-10-CM | POA: Diagnosis present

## 2023-10-09 DIAGNOSIS — R0902 Hypoxemia: Secondary | ICD-10-CM | POA: Diagnosis present

## 2023-10-09 DIAGNOSIS — Z794 Long term (current) use of insulin: Secondary | ICD-10-CM | POA: Diagnosis not present

## 2023-10-09 DIAGNOSIS — I447 Left bundle-branch block, unspecified: Secondary | ICD-10-CM | POA: Diagnosis present

## 2023-10-09 DIAGNOSIS — E6609 Other obesity due to excess calories: Secondary | ICD-10-CM

## 2023-10-09 DIAGNOSIS — Z1152 Encounter for screening for COVID-19: Secondary | ICD-10-CM | POA: Diagnosis not present

## 2023-10-09 DIAGNOSIS — E876 Hypokalemia: Secondary | ICD-10-CM | POA: Diagnosis present

## 2023-10-09 DIAGNOSIS — E1122 Type 2 diabetes mellitus with diabetic chronic kidney disease: Secondary | ICD-10-CM | POA: Diagnosis not present

## 2023-10-09 DIAGNOSIS — E872 Acidosis, unspecified: Secondary | ICD-10-CM | POA: Diagnosis present

## 2023-10-09 DIAGNOSIS — I509 Heart failure, unspecified: Secondary | ICD-10-CM | POA: Diagnosis not present

## 2023-10-09 DIAGNOSIS — E66811 Obesity, class 1: Secondary | ICD-10-CM | POA: Diagnosis present

## 2023-10-09 DIAGNOSIS — I5084 End stage heart failure: Secondary | ICD-10-CM | POA: Diagnosis present

## 2023-10-09 DIAGNOSIS — N179 Acute kidney failure, unspecified: Secondary | ICD-10-CM | POA: Diagnosis not present

## 2023-10-09 DIAGNOSIS — N281 Cyst of kidney, acquired: Secondary | ICD-10-CM | POA: Diagnosis not present

## 2023-10-09 DIAGNOSIS — E861 Hypovolemia: Secondary | ICD-10-CM | POA: Diagnosis present

## 2023-10-09 DIAGNOSIS — I2510 Atherosclerotic heart disease of native coronary artery without angina pectoris: Secondary | ICD-10-CM | POA: Diagnosis present

## 2023-10-09 DIAGNOSIS — Z950 Presence of cardiac pacemaker: Secondary | ICD-10-CM | POA: Diagnosis not present

## 2023-10-09 DIAGNOSIS — E785 Hyperlipidemia, unspecified: Secondary | ICD-10-CM | POA: Diagnosis present

## 2023-10-09 DIAGNOSIS — J449 Chronic obstructive pulmonary disease, unspecified: Secondary | ICD-10-CM | POA: Diagnosis not present

## 2023-10-09 DIAGNOSIS — D62 Acute posthemorrhagic anemia: Secondary | ICD-10-CM | POA: Diagnosis present

## 2023-10-09 DIAGNOSIS — R0602 Shortness of breath: Secondary | ICD-10-CM | POA: Diagnosis not present

## 2023-10-09 DIAGNOSIS — Z6833 Body mass index (BMI) 33.0-33.9, adult: Secondary | ICD-10-CM | POA: Diagnosis not present

## 2023-10-09 DIAGNOSIS — D751 Secondary polycythemia: Secondary | ICD-10-CM | POA: Diagnosis not present

## 2023-10-09 DIAGNOSIS — K2100 Gastro-esophageal reflux disease with esophagitis, without bleeding: Secondary | ICD-10-CM

## 2023-10-09 DIAGNOSIS — I130 Hypertensive heart and chronic kidney disease with heart failure and stage 1 through stage 4 chronic kidney disease, or unspecified chronic kidney disease: Secondary | ICD-10-CM | POA: Diagnosis present

## 2023-10-09 LAB — CBC
HCT: 25.5 % — ABNORMAL LOW (ref 39.0–52.0)
Hemoglobin: 8 g/dL — ABNORMAL LOW (ref 13.0–17.0)
MCH: 32.9 pg (ref 26.0–34.0)
MCHC: 31.4 g/dL (ref 30.0–36.0)
MCV: 104.9 fL — ABNORMAL HIGH (ref 80.0–100.0)
Platelets: 281 10*3/uL (ref 150–400)
RBC: 2.43 MIL/uL — ABNORMAL LOW (ref 4.22–5.81)
RDW: 19.2 % — ABNORMAL HIGH (ref 11.5–15.5)
WBC: 6.3 10*3/uL (ref 4.0–10.5)
nRBC: 0.3 % — ABNORMAL HIGH (ref 0.0–0.2)

## 2023-10-09 LAB — BASIC METABOLIC PANEL WITH GFR
Anion gap: 11 (ref 5–15)
BUN: 25 mg/dL — ABNORMAL HIGH (ref 8–23)
CO2: 19 mmol/L — ABNORMAL LOW (ref 22–32)
Calcium: 8.2 mg/dL — ABNORMAL LOW (ref 8.9–10.3)
Chloride: 109 mmol/L (ref 98–111)
Creatinine, Ser: 1.17 mg/dL (ref 0.61–1.24)
GFR, Estimated: >60 mL/min (ref >60)
Glucose, Bld: 131 mg/dL — ABNORMAL HIGH (ref 70–99)
Potassium: 3.4 mmol/L — ABNORMAL LOW (ref 3.5–5.1)
Sodium: 139 mmol/L (ref 135–145)

## 2023-10-09 LAB — ECHOCARDIOGRAM COMPLETE
Area-P 1/2: 4.41 cm2
Calc EF: 34.8 %
Height: 70 in
S' Lateral: 5.8 cm
Single Plane A2C EF: 34.4 %
Single Plane A4C EF: 36.7 %
Weight: 3749.58 [oz_av]

## 2023-10-09 LAB — IRON AND TIBC
Iron: 71 ug/dL (ref 45–182)
Saturation Ratios: 31 % (ref 17.9–39.5)
TIBC: 231 ug/dL — ABNORMAL LOW (ref 250–450)
UIBC: 160 ug/dL

## 2023-10-09 LAB — FERRITIN: Ferritin: 854 ng/mL — ABNORMAL HIGH (ref 24–336)

## 2023-10-09 LAB — PREPARE RBC (CROSSMATCH)

## 2023-10-09 LAB — HEMOGLOBIN AND HEMATOCRIT, BLOOD
HCT: 24.6 % — ABNORMAL LOW (ref 39.0–52.0)
HCT: 25.3 % — ABNORMAL LOW (ref 39.0–52.0)
Hemoglobin: 7.9 g/dL — ABNORMAL LOW (ref 13.0–17.0)
Hemoglobin: 8.1 g/dL — ABNORMAL LOW (ref 13.0–17.0)

## 2023-10-09 LAB — D-DIMER, QUANTITATIVE: D-Dimer, Quant: 2.52 ug{FEU}/mL — ABNORMAL HIGH (ref 0.00–0.50)

## 2023-10-09 LAB — MAGNESIUM: Magnesium: 1.4 mg/dL — ABNORMAL LOW (ref 1.7–2.4)

## 2023-10-09 LAB — PHOSPHORUS: Phosphorus: 3.3 mg/dL (ref 2.5–4.6)

## 2023-10-09 LAB — PROCALCITONIN: Procalcitonin: 0.11 ng/mL

## 2023-10-09 MED ORDER — PANTOPRAZOLE SODIUM 40 MG PO TBEC
40.0000 mg | DELAYED_RELEASE_TABLET | Freq: Every day | ORAL | Status: DC
  Administered 2023-10-10 – 2023-10-12 (×3): 40 mg via ORAL
  Filled 2023-10-09 (×3): qty 1

## 2023-10-09 MED ORDER — POLYVINYL ALCOHOL 1.4 % OP SOLN
1.0000 [drp] | OPHTHALMIC | Status: DC | PRN
  Filled 2023-10-09: qty 15

## 2023-10-09 MED ORDER — POTASSIUM CHLORIDE CRYS ER 20 MEQ PO TBCR
20.0000 meq | EXTENDED_RELEASE_TABLET | Freq: Two times a day (BID) | ORAL | Status: DC
  Administered 2023-10-09: 20 meq via ORAL
  Filled 2023-10-09: qty 1

## 2023-10-09 MED ORDER — PERFLUTREN LIPID MICROSPHERE
1.0000 mL | INTRAVENOUS | Status: AC | PRN
  Administered 2023-10-09: 3 mL via INTRAVENOUS

## 2023-10-09 MED ORDER — POLYETHYLENE GLYCOL 3350 17 G PO PACK: 17.0000 g | PACK | Freq: Every day | ORAL | Status: DC | PRN

## 2023-10-09 MED ORDER — ENOXAPARIN SODIUM 60 MG/0.6ML IJ SOSY
50.0000 mg | PREFILLED_SYRINGE | INTRAMUSCULAR | Status: DC
  Administered 2023-10-09 – 2023-10-12 (×4): 50 mg via SUBCUTANEOUS
  Filled 2023-10-09 (×4): qty 0.6

## 2023-10-09 MED ORDER — FUROSEMIDE 10 MG/ML IJ SOLN
80.0000 mg | Freq: Once | INTRAMUSCULAR | Status: AC
  Administered 2023-10-09: 80 mg via INTRAVENOUS
  Filled 2023-10-09: qty 8

## 2023-10-09 MED ORDER — ROSUVASTATIN CALCIUM 5 MG PO TABS
5.0000 mg | ORAL_TABLET | Freq: Every day | ORAL | Status: DC
  Administered 2023-10-10 – 2023-10-12 (×3): 5 mg via ORAL
  Filled 2023-10-09 (×3): qty 1

## 2023-10-09 MED ORDER — MAGNESIUM SULFATE 4 GM/100ML IV SOLN
4.0000 g | Freq: Once | INTRAVENOUS | Status: AC
  Administered 2023-10-09: 4 g via INTRAVENOUS
  Filled 2023-10-09: qty 100

## 2023-10-09 MED ORDER — POTASSIUM CHLORIDE CRYS ER 20 MEQ PO TBCR
40.0000 meq | EXTENDED_RELEASE_TABLET | Freq: Once | ORAL | Status: AC
  Administered 2023-10-09: 40 meq via ORAL
  Filled 2023-10-09: qty 2

## 2023-10-09 MED ORDER — CHOLESTYRAMINE 4 G PO PACK: 4.0000 g | PACK | Freq: Every day | ORAL | Status: DC | PRN

## 2023-10-09 MED ORDER — ALLOPURINOL 300 MG PO TABS
300.0000 mg | ORAL_TABLET | Freq: Every day | ORAL | Status: DC
  Administered 2023-10-09 – 2023-10-12 (×4): 300 mg via ORAL
  Filled 2023-10-09 (×4): qty 1

## 2023-10-09 MED ORDER — ALUM & MAG HYDROXIDE-SIMETH 200-200-20 MG/5ML PO SUSP
30.0000 mL | ORAL | Status: DC | PRN
  Administered 2023-10-09 – 2023-10-10 (×2): 30 mL via ORAL
  Filled 2023-10-09 (×2): qty 30

## 2023-10-09 MED ORDER — MIDODRINE HCL 5 MG PO TABS
10.0000 mg | ORAL_TABLET | ORAL | Status: AC
  Administered 2023-10-09: 10 mg via ORAL
  Filled 2023-10-09: qty 2

## 2023-10-09 MED ORDER — BUDESONIDE 0.5 MG/2ML IN SUSP
2.0000 mL | Freq: Two times a day (BID) | RESPIRATORY_TRACT | Status: DC
  Administered 2023-10-09 – 2023-10-12 (×7): 0.5 mg via RESPIRATORY_TRACT
  Filled 2023-10-09 (×7): qty 2

## 2023-10-09 MED ORDER — ACETAMINOPHEN 325 MG PO TABS
650.0000 mg | ORAL_TABLET | Freq: Four times a day (QID) | ORAL | Status: DC | PRN
  Administered 2023-10-10 – 2023-10-11 (×2): 650 mg via ORAL
  Filled 2023-10-09 (×2): qty 2

## 2023-10-09 MED ORDER — PROCHLORPERAZINE EDISYLATE 10 MG/2ML IJ SOLN
5.0000 mg | Freq: Four times a day (QID) | INTRAMUSCULAR | Status: DC | PRN
  Administered 2023-10-11: 5 mg via INTRAVENOUS
  Filled 2023-10-09: qty 2

## 2023-10-09 MED ORDER — ENOXAPARIN SODIUM 40 MG/0.4ML IJ SOSY: 40.0000 mg | PREFILLED_SYRINGE | INTRAMUSCULAR | Status: DC

## 2023-10-09 MED ORDER — PANTOPRAZOLE SODIUM 40 MG PO TBEC: 40.0000 mg | DELAYED_RELEASE_TABLET | Freq: Every day | ORAL | Status: DC

## 2023-10-09 MED ORDER — OXYBUTYNIN CHLORIDE ER 5 MG PO TB24
15.0000 mg | ORAL_TABLET | Freq: Every morning | ORAL | Status: DC
  Administered 2023-10-10 – 2023-10-12 (×3): 15 mg via ORAL
  Filled 2023-10-09 (×3): qty 1

## 2023-10-09 MED ORDER — MELATONIN 5 MG PO TABS
5.0000 mg | ORAL_TABLET | Freq: Every evening | ORAL | Status: DC | PRN
  Administered 2023-10-09 (×2): 5 mg via ORAL
  Filled 2023-10-09 (×2): qty 1

## 2023-10-09 MED ORDER — TAMSULOSIN HCL 0.4 MG PO CAPS
0.4000 mg | ORAL_CAPSULE | Freq: Every day | ORAL | Status: DC
  Administered 2023-10-09 – 2023-10-12 (×4): 0.4 mg via ORAL
  Filled 2023-10-09 (×4): qty 1

## 2023-10-09 MED ORDER — PANTOPRAZOLE SODIUM 40 MG IV SOLR
40.0000 mg | Freq: Two times a day (BID) | INTRAVENOUS | Status: DC
  Administered 2023-10-09: 40 mg via INTRAVENOUS
  Filled 2023-10-09: qty 10

## 2023-10-09 NOTE — Assessment & Plan Note (Signed)
 Hypokalemia  Renal function with serum cr at 1,17 with K at 3,4 and serum bicarbonate at 19  Na 139 and Mg 1,4   Continue electrolyte correction with Kcl and Mg sulfate Follow up renal function and electrolytes Avoid hypotension and nephrotoxic medications

## 2023-10-09 NOTE — Assessment & Plan Note (Signed)
 2024 echocardiogram with reduced LV systolic function EF less than 20%, global hypokinesis, severe dilatated cavity, RV systolic function with severe reduction, LA and RA with moderate dilatation, no significant valvular disease.  Possible hypovolemia due to diarrhea.  Patient getting PRBC transfusion per cardiology along with 80 mg IV furosemide   Systolic blood pressure 100 mmHg range.

## 2023-10-09 NOTE — Assessment & Plan Note (Signed)
Continue proton pump inhibitors.

## 2023-10-09 NOTE — Consult Note (Addendum)
 Advanced Heart Failure Team Consult Note  Primary Physician: Rae Bugler, MD Cardiologist:  Alexandria Angel, MD Reason for Consultation: a/c systolic heart failure HPI:    William Villanueva is seen today for evaluation of acute on chronic systolic heart failure at the request of Dr. Sunnie England.   William Villanueva is a 73 y.o. male with a PMH of PMH of nonischemic chronic systolic heart failure, heart block s/p CRT-D, diabetes, CKD, HTN, HLD, and OSA.  Evaluated by Dr. Audery Blazing in August 2013 with complaints of chest pain. Echo showed EF 35 to 40%. Cardiac catheterization 01/2012 showed mild nonobstructive CAD with no hemodynamic evidence of restriction. CMRI 9/132013 showed EF 34% with diffuse hypokinesis, no hyperenhancement or scar tissue and no evidence of cardiac hemochromatosis.   Was recently referred to [facility] in 06/2023. Is Borderline ACC/AHA stage D with NYHA IIIb symptoms. LVAD discussed in detail, however patient is not currently interested in VAD at this time.  Underwent Barostim 10/04/23.   Presented to [place] ED 10/08/23 with shortness of breath and diarrhea for 2 days. In ED BP 130/82. HR 90-100s, O2sat 85% on RA (placed on 2L [place]), mild temp. Labs notable for K 3.9, BUN/Cr 24/0.94, pro-BNP 17141, hs-trop 100>95, hgb 8.5, d-dimer 2.5. RVP negative. EKG with AsVp with 2 PVCs. CXR with significant b/l pulm edema +/- PNA. Started on Lasix  and CAP coverage. Blood cultures with no growth to date. He was admitted to [facility].   Repeat echo ordered and pending.   Patient is lying in the bed. Appears uncomfortable, wife at the bedside. SOB improving with diuresis. Pale. Still having intermittent nausea. Has not noticed any bleeding. Reports that at home he had vomited and contents were gray in color.   Home Medications Prior to Admission medications   Medication Sig Start Date End Date Taking? Authorizing Provider  acetaminophen  (TYLENOL ) 500 MG tablet Take 1,000 mg by mouth every 6  (six) hours as needed for headache.    [provider]  allopurinol  (ZYLOPRIM ) 300 MG tablet Take 1 tablet (300 mg total) by mouth daily. 04/18/23   Rice, Haig Levan, MD  Ascorbic Acid (VITAMIN C PO) Take 1 tablet by mouth daily.    [provider]  budesonide  (PULMICORT ) 0.5 MG/2ML nebulizer solution Take 2 mLs (0.5 mg total) by nebulization 2 (two) times daily. (RINSE MOUTH AFTER USE) Patient taking differently: Take 2 mLs by nebulization daily. 09/28/22   Diamond Formica, MD  cholestyramine  (QUESTRAN ) 4 GM/DOSE powder Take 4 g by mouth daily as needed (diarrhea). 03/22/22   [provider]  clotrimazole (LOTRIMIN) 1 % cream Apply 1 Application topically daily as needed (irritation). 06/25/23   [provider]  colestipol  (COLESTID ) 1 g tablet Take 2 g by mouth daily.    [provider]  Cyanocobalamin  (B-12 PO) Take 1 tablet by mouth daily.    [provider]  diclofenac  Sodium (VOLTAREN ) 1 % GEL Apply 2 g topically daily as needed (pain). Patient not taking: Reported on 09/26/2023    [provider]  dicyclomine  (BENTYL ) 20 MG tablet Take 20 mg by mouth 2 (two) times daily. 03/28/22   [provider]  fluorouracil (EFUDEX) 5 % cream 1 Application daily. Patient not taking: Reported on 09/26/2023 09/07/20   [provider]  HYDROcodone -acetaminophen  (NORCO/VICODIN) 5-325 MG tablet Take 1 tablet by mouth every 6 (six) hours as needed for moderate pain (pain score 4-6). 10/04/23   Cordie Deters, PA-C  insulin   degludec (TRESIBA FLEXTOUCH) 100 UNIT/ML SOPN FlexTouch Pen Inject 16 Units into the skin daily after breakfast.    [provider]  losartan  (COZAAR ) 25 MG tablet TAKE 1/2 TABLET BY MOUTH DAILY 03/23/23   Lenise Quince, MD  magnesium  gluconate (MAGONATE) 500 MG tablet Take 500 mg by mouth daily.    [provider]  metoprolol  succinate (TOPROL  XL) 25 MG 24 hr tablet Take 0.5 tablets (12.5 mg  total) by mouth daily. 05/23/23   Conan December, PA-C  omeprazole  (PRILOSEC) 20 MG capsule Take 20 mg by mouth in the morning and at bedtime.    [provider]  oxybutynin  (DITROPAN  XL) 15 MG 24 hr tablet Take 15 mg by mouth in the morning. 02/06/22   [provider]  ruxolitinib  phosphate (JAKAFI ) 10 MG tablet Take 1 tablet (10 mg total) by mouth 2 (two) times daily. 08/02/23   Ivor Mars, MD  sodium bicarbonate  650 MG tablet Take 1 tablet (650 mg total) by mouth 2 (two) times daily. 12/24/22   Ezzard Holms, MD  tamsulosin  (FLOMAX ) 0.4 MG CAPS capsule Take 0.4 mg by mouth daily. 08/03/17   [provider]  torsemide  (DEMADEX ) 20 MG tablet Take 40 mg by mouth 2 (two) times daily.    [provider]    Past Medical History: Past Medical History:  Diagnosis Date   AICD (automatic cardioverter/defibrillator) present    Arthritis    AV block, 2nd degree 2015   St. Jude Allure Quadra pulse generator [id number], model PM 3242   Back pain    Cardiomyopathy (HCC)    Nonischemic 45%.    CHF (congestive heart failure) (HCC)    Diabetes mellitus without complication (HCC)    Fracture of lumbar vertebra without spinal cord injury, sequela 10/09/2022   Gout    Hemochromatosis    History of kidney stones    Hypertension    Hypospadias [birth year]   born with   Nephrolithiasis    Pacemaker lead malfunction-elevated threshold RV lead 07/31/2014   Polycythemia vera(238.4)    Presence of permanent cardiac pacemaker     Past Surgical History: Past Surgical History:  Procedure Laterality Date   ANKLE SURGERY     BI-VENTRICULAR PACEMAKER INSERTION N/A 04/29/2014   Procedure: BI-VENTRICULAR PACEMAKER INSERTION (CRT-P);  Surgeon: Verona Goodwill, MD; Laterality: Left  St. Jude Allure Quadra pulse generator [phone], model [place] 4540   CARDIAC SURGERY     COLONOSCOPY N/A 02/15/2020   Procedure: COLONOSCOPY;  Surgeon: Genell Ken, MD;  Location: [facility] ENDOSCOPY;   Service: Gastroenterology;  Laterality: N/A;   ESOPHAGOGASTRODUODENOSCOPY (EGD) WITH PROPOFOL  N/A 02/15/2020   Procedure: ESOPHAGOGASTRODUODENOSCOPY (EGD) WITH PROPOFOL ;  Surgeon: Genell Ken, MD;  Location: [facility] ENDOSCOPY;  Service: Gastroenterology;  Laterality: N/A;   LEAD INSERTION N/A 04/28/2022   Procedure: LEAD INSERTION;  Surgeon: Verona Goodwill, MD;  Location: [facility] INVASIVE CV LAB;  Service: Cardiovascular;  Laterality: N/A;   PILONIDAL CYST EXCISION     POSTERIOR LAMINECTOMY / DECOMPRESSION LUMBAR SPINE     PPM GENERATOR CHANGEOUT N/A 04/28/2022   Procedure: PPM GENERATOR CHANGEOUT;  Surgeon: Verona Goodwill, MD;  Location: [facility] INVASIVE CV LAB;  Service: Cardiovascular;  Laterality: N/A;   RIGHT HEART CATH N/A 08/09/2023   Procedure: RIGHT HEART CATH;  Surgeon: Lauralee Poll, MD;  Location: [facility] INVASIVE CV LAB;  Service: Cardiovascular;  Laterality: N/A;   RIGHT/LEFT HEART CATH AND CORONARY ANGIOGRAPHY N/A 01/10/2023   Procedure: RIGHT/LEFT HEART CATH  AND CORONARY ANGIOGRAPHY;  Surgeon: Arleen Lacer, MD;  Location[facility] INVASIVE CV LAB;  Service: Cardiovascular;  Laterality: N/A;   TONSILLECTOMY      Family History: Family History  Problem Relation Age of Onset   Stroke Mother    Other Father        Deceased, car fell on him   Diabetes Sister    Hypertension Sister    Heart disease Maternal Grandmother        Pacemaker, MI    Social History: Social History   Socioeconomic History   Marital status: Married    Spouse name: Devra Fontana   Number of children: 0   Years of education: Not on file   Highest education level: High school graduate  Occupational History   Occupation: Retired  Tobacco Use   Smoking status: Former    Current packs/day: 0.00    Average packs/day: 1 pack/day for 44.8 years (44.8 ttl pk-yrs)    Types: Cigarettes    Start date: 06/05/1968    Quit date: 04/05/2013    Years since quitting: 10.5    Passive exposure: Never   Smokeless tobacco: Never   Vaping Use   Vaping status: Never Used  Substance and Sexual Activity   Alcohol  use: Yes    Alcohol /week: 0.0 standard drinks of alcohol     Comment: rare   Drug use: No   Sexual activity: Not Currently  Other Topics Concern   Not on file  Social History Narrative   Lives at home with wife in a one story home.  Has no children.  Does not work.  Getting workman's comp.  Education: 4 years trade school.    Social Drivers of Corporate investment banker Strain: Low Risk  (02/06/2022)   Overall Financial Resource Strain (CARDIA)    Difficulty of Paying Living Expenses: Not very hard  Food Insecurity: No Food Insecurity (10/09/2023)   Hunger Vital Sign    Worried About Running Out of Food in the Last Year: Never true    Ran Out of Food in the Last Year: Never true  Transportation Needs: No Transportation Needs (10/09/2023)   PRAPARE - Administrator, Civil Service (Medical): No    Lack of Transportation (Non-Medical): No  Physical Activity: Not on file  Stress: Not on file  Social Connections: Moderately Integrated (10/09/2023)   Social Connection and Isolation Panel [NHANES]    Frequency of Communication with Friends and Family: More than three times a week    Frequency of Social Gatherings with Friends and Family: More than three times a week    Attends Religious Services: 1 to 4 times per year    Active Member of [employer] or Organizations: No    Attends Banker Meetings: Never    Marital Status: Married    Allergies:  Allergies  Allergen Reactions   Advil [Ibuprofen] Itching   Keflex [Cephalexin] Diarrhea and Other (See Comments)    Caused C-diff, also    Wellbutrin [Bupropion] Hives and Other (See Comments)   Prednisone  Itching and Other (See Comments)    Abdominal pain, also    Temazepam  Other (See Comments)    Dizziness    Trazodone  And Nefazodone Other (See Comments)    Dizziness   Entresto  [Sacubitril -Valsartan ] Other (See Comments)    Unknown    Prozac [Fluoxetine Hcl] Itching    Objective:    Vital Signs:   Temp:  [97.7 F (36.5 C)-99.4 F (37.4 C)] 98.3 F (  36.8 C) (06/03 0724) Pulse Rate:  [91-107] 100 (06/03 0724) Resp:  [15-28] 20 (06/03 0724) BP: (95-130)/(35-82) 108/55 (06/03 0724) SpO2:  [85 %-100 %] 96 % (06/03 0724) Weight:  [104.3 kg-106.3 kg] 106.3 kg (06/03 0528) Last BM Date : 10/09/23  Weight change: Filed Weights   10/08/23 1309 10/09/23 0258 10/09/23 0528  Weight: 104.3 kg 106.3 kg 106.3 kg   Intake/Output:  Intake/Output Summary (Last 24 hours) at 10/09/2023 0900 Last data filed at 10/09/2023 0541 Gross per 24 hour  Intake 500.35 ml  Output 900 ml  Net -399.65 ml    Physical Exam    General: Pale, ill appearing. No distress on RA Cardiac: JVP ~13cm. S1 and S2 present. No murmurs or rub. Abdomen: Soft, non-tender, non-distended.  Extremities: Warm and dry.  No peripheral edema.  Neuro: Alert and oriented x3. Affect pleasant. Moves all extremities without difficulty.  Telemetry   ST 100 bpm (personally reviewed)  EKG    As above  Labs   Basic Metabolic Panel: Recent Labs  Lab 10/08/23 1420 10/09/23 0258  NA 140 139  K 3.9 3.4*  CL 107 109  CO2 19* 19*  GLUCOSE 113* 131*  BUN 24* 25*  CREATININE 0.94 1.17  CALCIUM  8.4* 8.2*  MG  --  1.4*  PHOS  --  3.3   Liver Function Tests: Recent Labs  Lab 10/08/23 1420  AST 17  ALT 11  ALKPHOS 82  BILITOT 0.6  PROT 6.7  ALBUMIN  3.8   CBC: Recent Labs  Lab 10/08/23 1420 10/09/23 0258 10/09/23 0646  WBC 6.5 6.3  --   HGB 8.5* 8.0* 7.9*  HCT 26.4* 25.5* 24.6*  MCV 103.1* 104.9*  --   PLT 260 281  --    BNP (last 3 results) Recent Labs    12/21/22 1922 01/08/23 1801 07/18/23 1244  BNP 889.4* 1,183.7* 612.8*   ProBNP (last 3 results) Recent Labs    08/28/23 0909 10/08/23 1420  PROBNP 6,803.0* 17,141.0*   CBG: Recent Labs  Lab 10/04/23 0815 10/04/23 1126 10/04/23 1156  GLUCAP 107* 113* 130*   Imaging   DG  Chest 2 View Result Date: 10/08/2023 CLINICAL DATA:  Shortness of breath EXAM: CHEST - 2 VIEW COMPARISON:  Chest radiograph dated 01/08/2023 FINDINGS: Lines/tubes: Left chest wall ICD leads project over the right atrium and ventricle and tributary of the coronary sinus. Right chest wall stimulator device with lead terminating above the field of view. Lungs: Mildly low lung volumes. Increased bibasilar interstitial and patchy opacities. Pleura: Trace blunting of bilateral costophrenic angles. No pneumothorax. Heart/mediastinum: Enlarged cardiomediastinal silhouette. Bones: No acute osseous abnormality. IMPRESSION: 1. Increased bibasilar interstitial and patchy opacities, which may represent pulmonary edema or infection. 2. Trace blunting of bilateral costophrenic angles, which may represent trace pleural effusions. 3. Cardiomegaly. Electronically Signed   By: Limin  Xu M.D.   On: 10/08/2023 15:39   Medications:    Current Medications:  budesonide   2 mL Nebulization BID   enoxaparin  (LOVENOX ) injection  50 mg Subcutaneous Q24H   pantoprazole  (PROTONIX ) IV  40 mg Intravenous BID   potassium chloride   20 mEq Oral BID   Infusions:  Patient Profile   William Villanueva is a 73 y.o. male with a PMH of PMH of nonischemic chronic systolic heart failure, heart block s/p CRT-D, diabetes, CKD, HTN, HLD, and OSA.  Assessment/Plan   Acute on Chronic systolic heart failure: Patient with borderline ACC/AHA stage D cardiomyopathy with NYHA class IIIb symptoms. RHC  4/25 showed borderline index. He is not interested in LVAD at this time, suspect borderline candidate with his deconditioning. Amyloid workup negative - s/p Barostim 10/04/23 - proBNP 17k, up from 6K in April. - give Lasix  80 mg bid today - GDMT limited by hypotension; unit RBCs should improve pressure - hold metoprolol  and losartan  - would like to add spiro to assist with K replacement, however hypotensive - will touch base with research about  COMET trial; as previously noted by Dr. Alease Amend   Anemia - h/o anemia of chronic disease. Now with concern for blood loss.  - hgb 12.9 on 08/09/23. Now down to 8.5 on admit - 8.0 this morning, pale, fatigued - no overt bleeding noted - transfuse 1u RBCs + lasix  as above - fecal occult ordered   CKD 3a - b/l 1.3-1.4 - normalize this admission in the setting of higher volumes, suspect to see rise to baseline with diuresis.  - diuresis as above  CHB: CRT-D in place, follows with EP   HTN: hypotensive this morning, expect will improve with 1u RBCs.    HLD: continue crestor  5 mg daily  Diarrhea - has intermittently at home; on Questran   - could be worsened by new baro stimulator   Diabetes:  - Currently on insulin , follows with PCP, last A1c 5.6  ID: recent intubation on 5/29. Started on treatment for cap with rochephin/doxy per primary. - Bcx- NGTD; procal 0.11  Length of Stay: 0  [place] Stellarose Cerny, NP  10/09/2023, 9:00 AM  Advanced Heart Failure Team Pager [phone] (M-F; 7a - 5p)  Please contact [facility] for night-coverage after hours (4p -7a ) and weekends on [email or web address]

## 2023-10-09 NOTE — H&P (Addendum)
 History and Physical  William Villanueva VWU:[id number] DOB: [birth year] DOA: 10/08/2023  Referring physician: Accepted by Dr. Gonfa, [facility], hospitalist service. PCP: Rae Bugler, MD  Outpatient Specialists: Cardiology. Patient coming from: Home through [facility] ED.  Chief Complaint:  Diarrhea and cough x 2 days, emesis x 1 day.  HPI: William Villanueva is a 73 y.o. male with medical history significant for chronic HFrEF with LVEF less than 20%, polycythemia vera, prediabetes, nonischemic cardiomyopathy, obesity, NYHA class III heart failure symptoms that persisted despite guideline directed medical therapy status post Barostim device placement on 10/04/2023 by vascular surgery Dr. Charlotte Cookey, who presents to the ER with complaints of diarrhea and cough x 2 days.  Associated with emesis x 1 day.  No reported subjective fevers, states he is always cold.  The patient initially presented to [facility] ED, weak appearing with new hypoxia and O2 saturation in the 80s, improved on 2 L nasal cannula.  Mildly volume overload on exam.  BNP greater than 17,000.  Chest x-ray with findings consistent with pulmonary edema.  Troponin downtrending 95 from 100.  In the ER, the patient received Rocephin  and p.o. doxycycline  due to concern for possible pneumonia.  Additionally received a dose of IV Lasix  40 mg x 1.  TR[facility], hospitalist service, was asked to admit.  Accepted by Dr. Gonfa and transferred to [facility] progressive care unit as inpatient status.  ED Course: Temperature 98.9.  BP 95/60, pulse 104, respiratory rate 23, O2 saturation 95% on 2 L.  Lab studies notable for WBC 6.5, hemoglobin 8.0 from 8.5.  Hemoglobin 10.2 on 09/26/2023 and 12.3 on 08/28/2023.  MCV 104.  Platelet count 281.  Serum potassium 3.4.  Serum bicarb 19.  BUN 25, creatinine 1.17 with GFR greater than 60.  Magnesium  1.4.  Review of Systems: Review of systems as noted in the HPI. All other systems reviewed and are negative.   Past Medical  History:  Diagnosis Date   AICD (automatic cardioverter/defibrillator) present    Arthritis    AV block, 2nd degree 2015   St. Jude Allure Quadra pulse generator [id number], model PM 3242   Back pain    Cardiomyopathy (HCC)    Nonischemic 45%.    CHF (congestive heart failure) (HCC)    Diabetes mellitus without complication (HCC)    Fracture of lumbar vertebra without spinal cord injury, sequela 10/09/2022   Gout    Hemochromatosis    History of kidney stones    Hypertension    Hypospadias January 21, 1951   born with   Nephrolithiasis    Pacemaker lead malfunction-elevated threshold RV lead 07/31/2014   Polycythemia vera(238.4)    Presence of permanent cardiac pacemaker    Past Surgical History:  Procedure Laterality Date   ANKLE SURGERY     BI-VENTRICULAR PACEMAKER INSERTION N/A 04/29/2014   Procedure: BI-VENTRICULAR PACEMAKER INSERTION (CRT-P);  Surgeon: Verona Goodwill, MD; Laterality: Left  St. Jude Allure Quadra pulse generator [id number], model [place] 0865   CARDIAC SURGERY     COLONOSCOPY N/A 02/15/2020   Procedure: COLONOSCOPY;  Surgeon: Genell Ken, MD;  Location: [facility] ENDOSCOPY;  Service: Gastroenterology;  Laterality: N/A;   ESOPHAGOGASTRODUODENOSCOPY (EGD) WITH PROPOFOL  N/A 02/15/2020   Procedure: ESOPHAGOGASTRODUODENOSCOPY (EGD) WITH PROPOFOL ;  Surgeon: Genell Ken, MD;  Location: [facility] ENDOSCOPY;  Service: Gastroenterology;  Laterality: N/A;   LEAD INSERTION N/A 04/28/2022   Procedure: LEAD INSERTION;  Surgeon: Verona Goodwill, MD;  Location: [facility] INVASIVE CV LAB;  Service: Cardiovascular;  Laterality: N/A;  PILONIDAL CYST EXCISION     POSTERIOR LAMINECTOMY / DECOMPRESSION LUMBAR SPINE     PPM GENERATOR CHANGEOUT N/A 04/28/2022   Procedure: PPM GENERATOR CHANGEOUT;  Surgeon: Verona Goodwill, MD;  Location: [facility] INVASIVE CV LAB;  Service: Cardiovascular;  Laterality: N/A;   RIGHT HEART CATH N/A 08/09/2023   Procedure: RIGHT HEART CATH;  Surgeon: Lauralee Poll, MD;  Location: [facility]  INVASIVE CV LAB;  Service: Cardiovascular;  Laterality: N/A;   RIGHT/LEFT HEART CATH AND CORONARY ANGIOGRAPHY N/A 01/10/2023   Procedure: RIGHT/LEFT HEART CATH AND CORONARY ANGIOGRAPHY;  Surgeon: Arleen Lacer, MD;  Location: [facility] INVASIVE CV LAB;  Service: Cardiovascular;  Laterality: N/A;   TONSILLECTOMY      Social History:  reports that he quit smoking about 10 years ago. His smoking use included cigarettes. He started smoking about 55 years ago. He has a 44.8 pack-year smoking history. He has never been exposed to tobacco smoke. He has never used smokeless tobacco. He reports current alcohol  use. He reports that he does not use drugs.   Allergies  Allergen Reactions   Advil [Ibuprofen] Itching   Keflex [Cephalexin] Diarrhea and Other (See Comments)    Caused C-diff, also    Wellbutrin [Bupropion] Hives and Other (See Comments)   Prednisone  Itching and Other (See Comments)    Abdominal pain, also    Temazepam  Other (See Comments)    Dizziness    Trazodone  And Nefazodone Other (See Comments)    Dizziness   Entresto  [Sacubitril -Valsartan ] Other (See Comments)    Unknown   Prozac [Fluoxetine Hcl] Itching    Family History  Problem Relation Age of Onset   Stroke Mother    Other Father        Deceased, car fell on him   Diabetes Sister    Hypertension Sister    Heart disease Maternal Grandmother        Pacemaker, MI      Prior to Admission medications   Medication Sig Start Date End Date Taking? Authorizing Provider  acetaminophen  (TYLENOL ) 500 MG tablet Take 1,000 mg by mouth every 6 (six) hours as needed for headache.    [provider]  allopurinol  (ZYLOPRIM ) 300 MG tablet Take 1 tablet (300 mg total) by mouth daily. 04/18/23   Rice, Haig Levan, MD  Ascorbic Acid (VITAMIN C PO) Take 1 tablet by mouth daily.    [provider]  budesonide  (PULMICORT ) 0.5 MG/2ML nebulizer solution Take 2 mLs (0.5 mg total) by nebulization 2 (two) times daily. (RINSE  MOUTH AFTER USE) Patient taking differently: Take 2 mLs by nebulization daily. 09/28/22   Diamond Formica, MD  cholestyramine  (QUESTRAN ) 4 GM/DOSE powder Take 4 g by mouth daily as needed (diarrhea). 03/22/22   [provider]  clotrimazole (LOTRIMIN) 1 % cream Apply 1 Application topically daily as needed (irritation). 06/25/23   [provider]  colestipol  (COLESTID ) 1 g tablet Take 2 g by mouth daily.    [provider]  Cyanocobalamin  (B-12 PO) Take 1 tablet by mouth daily.    [provider]  diclofenac  Sodium (VOLTAREN ) 1 % GEL Apply 2 g topically daily as needed (pain). Patient not taking: Reported on 09/26/2023    [provider]  dicyclomine  (BENTYL ) 20 MG tablet Take 20 mg by mouth 2 (two) times daily. 03/28/22   [provider]  fluorouracil (EFUDEX) 5 % cream 1 Application daily. Patient not taking: Reported on 09/26/2023 09/07/20   [provider]  HYDROcodone -acetaminophen  (  NORCO/VICODIN) 5-325 MG tablet Take 1 tablet by mouth every 6 (six) hours as needed for moderate pain (pain score 4-6). 10/04/23   Cordie Deters, PA-C  insulin  degludec (TRESIBA FLEXTOUCH) 100 UNIT/ML SOPN FlexTouch Pen Inject 16 Units into the skin daily after breakfast.    [provider]  losartan  (COZAAR ) 25 MG tablet TAKE 1/2 TABLET BY MOUTH DAILY 03/23/23   Lenise Quince, MD  magnesium  gluconate (MAGONATE) 500 MG tablet Take 500 mg by mouth daily.    [provider]  metoprolol  succinate (TOPROL  XL) 25 MG 24 hr tablet Take 0.5 tablets (12.5 mg total) by mouth daily. 05/23/23   Conan December, PA-C  omeprazole  (PRILOSEC) 20 MG capsule Take 20 mg by mouth in the morning and at bedtime.    [provider]  oxybutynin  (DITROPAN  XL) 15 MG 24 hr tablet Take 15 mg by mouth in the morning. 02/06/22   [provider]  ruxolitinib  phosphate (JAKAFI ) 10 MG tablet Take 1 tablet (10 mg total) by mouth 2 (two) times daily.  08/02/23   Ivor Mars, MD  sodium bicarbonate  650 MG tablet Take 1 tablet (650 mg total) by mouth 2 (two) times daily. 12/24/22   Ezzard Holms, MD  tamsulosin  (FLOMAX ) 0.4 MG CAPS capsule Take 0.4 mg by mouth daily. 08/03/17   [provider]  torsemide  (DEMADEX ) 20 MG tablet Take 40 mg by mouth 2 (two) times daily.    [provider]    Physical Exam: BP 95/60   Pulse (!) 103   Temp 98.9 F (37.2 C) (Oral)   Resp (!) 23   Ht 5\' 10"  (1.778 m)   Wt 104.3 kg   SpO2 95%   BMI 33.00 kg/m   General: 73 y.o. year-old male well developed well nourished in no acute distress.  Alert and oriented x3. Cardiovascular: Regular rate and rhythm with no rubs or gallops.  No thyromegaly noted.  Trace lower extremities bilaterally. Respiratory: Mild rales at bases.  Poor inspiratory effort. Abdomen: Soft obese, nontender nondistended with normal bowel sounds x4 quadrants. Muskuloskeletal: No cyanosis, clubbing or edema noted bilaterally Neuro: CN II-XII intact, strength, sensation, reflexes Skin: No ulcerative lesions noted or rashes Psychiatry: Judgement and insight appear normal. Mood is appropriate for condition and setting          Labs on Admission:  Basic Metabolic Panel: Recent Labs  Lab 10/08/23 1420  NA 140  K 3.9  CL 107  CO2 19*  GLUCOSE 113*  BUN 24*  CREATININE 0.94  CALCIUM  8.4*   Liver Function Tests: Recent Labs  Lab 10/08/23 1420  AST 17  ALT 11  ALKPHOS 82  BILITOT 0.6  PROT 6.7  ALBUMIN  3.8   No results for input(s): "LIPASE", "AMYLASE" in the last 168 hours. No results for input(s): "AMMONIA" in the last 168 hours. CBC: Recent Labs  Lab 10/08/23 1420  WBC 6.5  HGB 8.5*  HCT 26.4*  MCV 103.1*  PLT 260   Cardiac Enzymes: No results for input(s): "CKTOTAL", "CKMB", "CKMBINDEX", "TROPONINI" in the last 168 hours.  BNP (last 3 results) Recent Labs    12/21/22 1922 01/08/23 1801 07/18/23 1244  BNP 889.4* 1,183.7* 612.8*     ProBNP (last 3 results) Recent Labs    08/28/23 0909 10/08/23 1420  PROBNP 6,803.0* 17,141.0*    CBG: Recent Labs  Lab 10/04/23 0815 10/04/23 1126 10/04/23 1156  GLUCAP 107* 113* 130*    Radiological Exams on Admission: DG  Chest 2 View Result Date: 10/08/2023 CLINICAL DATA:  Shortness of breath EXAM: CHEST - 2 VIEW COMPARISON:  Chest radiograph dated 01/08/2023 FINDINGS: Lines/tubes: Left chest wall ICD leads project over the right atrium and ventricle and tributary of the coronary sinus. Right chest wall stimulator device with lead terminating above the field of view. Lungs: Mildly low lung volumes. Increased bibasilar interstitial and patchy opacities. Pleura: Trace blunting of bilateral costophrenic angles. No pneumothorax. Heart/mediastinum: Enlarged cardiomediastinal silhouette. Bones: No acute osseous abnormality. IMPRESSION: 1. Increased bibasilar interstitial and patchy opacities, which may represent pulmonary edema or infection. 2. Trace blunting of bilateral costophrenic angles, which may represent trace pleural effusions. 3. Cardiomegaly. Electronically Signed   By: Limin  Xu M.D.   On: 10/08/2023 15:39    EKG: I independently viewed the EKG done and my findings are as followed: Ventricular paced.  Rate of 97.  QTc 531.  Assessment/Plan Present on Admission:  Acute on chronic combined systolic (congestive) and diastolic (congestive) heart failure (HCC)  Active Problems:   Acute on chronic combined systolic (congestive) and diastolic (congestive) heart failure (HCC)  Acute on chronic HFrEF with LVEF less than 20% Last 2D echo done on 12/22/2022 revealed LVEF less than 20% with diffuse hypokinesis.  Septal, inferior and apical akinesis.  LVEF from 2024 was worse than seen on the echo done on 02/04/2022 which showed LVEF of 40% with grade 2 diastolic dysfunction Presented to the ER with proBNP significantly elevated greater than 17,000 Findings consistent with pulmonary  edema on chest x-ray, personally reviewed. Trace lower extremity edema bilaterally. Follow repeat 2D echo Continue diuresing as blood pressure allows Cardiology consulted by EDP. Closely monitor strict I's and O's and daily weight. Rest of management per cardiology.  Elevated troponin, suspect demand ischemia in the setting of acute hypoxia and acute on chronic HFrEF Troponin peaked at 100 and downtrended. Ventricular paced on twelve-lead EKG. Follow repeat 2D echo Closely monitor in progressive care unit. No reported anginal symptoms. Rest of management per cardiology  Acute hypoxic respiratory failure secondary to pulmonary edema, likely cardiogenic. Chest x-ray revealed cardiomegaly and increased pulmonary vascularity consistent with pulmonary edema. Not on oxygen  supplementation at baseline Currently on 2 L to maintain O2 saturation above 92% Continue diuresing as blood pressure allows. Incentive spirometer. Early mobilization when cleared by cardiology.  NYHA class III heart failure symptoms that persisted despite guideline directed medical therapy status post Barostim device placement on 10/04/2023 by vascular surgery Dr. Charlotte Cookey Management per cardiology.  Acute blood loss anemia, no reported overt bleeding. History of polycythemia vera History of duodenal ulcer seen on EGD done in 2021. Acute drop in hemoglobin 8.0 with MCV of 104 Ordered FOBT and added IV Protonix  40 mg x 2 doses Continue to closely monitor H&H Closely monitor for symptoms Repeat H&H and transfuse hemoglobin less than 8.0.  Cough with questionable pneumonia Influenza A&B, RSV, COVID-19, by PCR, all negative Currently afebrile with no leukocytosis. Follow baseline procalcitonin, resume antibiotics initiated in the ER, if indicated Received 1 dose of Rocephin  and 1 dose of p.o. doxycycline  in the ER  Hypokalemia Potassium 3.4 Repleted orally Magnesium  is low, 1.4, repleted  intravenously  Hypomagnesemia Serum magnesium  1.4 Repleted intravenously  Mild non-anion gap metabolic acidosis Serum bicarb 19, anion gap 11 Repeat labs  Diarrhea, acute C. difficile antigen and toxic C. difficile PCR positive in 2018 Currently afebrile with no leukocytosis. Closely monitor for recurrence of loose stools. Closely monitor fever curve and WBCs  Obesity BMI 33  Recommend weight management outpatient once stable   Critical care time: 55 minutes.   DVT prophylaxis: Subcu Lovenox  daily.  Code Status: Full code.  Family Communication: The patient's wife is at bedside.  Disposition Plan: Admitted to progressive care unit.  Consults called: Cardiology consulted.  Admission status: Inpatient status.   Status is: Inpatient The patient requires at least 2 midnights for further evaluation and treatment of present condition.   Bary Boss MD [facility] Pager [phone]  If 7PM-7AM, please contact night-coverage [email or web address] Password [email or web address]  10/09/2023, 1:59 AM

## 2023-10-09 NOTE — ED Notes (Signed)
 RN called and notified 3E that [facility] arrived to transport pt to [facility].

## 2023-10-09 NOTE — Progress Notes (Signed)
    Patient status post right-sided BaroStim placement last week heart failure exacerbation as well as diarrhea and anemia with concern for postoperative bleeding.  On exam his incision sites are very soft and the dressing in the neck was removed.  He does have expected postoperative bleeding but I do not believe he needs any further surgical intervention and will be available as needed.  Terence Googe C. Vikki Graves, MD [facility] Office: [phone] Pager: [phone]

## 2023-10-09 NOTE — Assessment & Plan Note (Signed)
 Patient has been on ruxolitinib . His hgb is 8,1  Follow cell counts.

## 2023-10-09 NOTE — Hospital Course (Addendum)
 Mr. Markarian was admitted to the hospital with the working diagnosis of heart failure exacerbation   73 yo male with the past medical history of heart failure, polycythemia, and obesity who presented with diarrhea.  05/29 Placement of a right sided Barostim device.  05/30 patient developed severe diarrhea, about 2 bowel movements per hour for 3 consecutive days, prompting him to come to the hospital.  Along with severe diarrhea he developed fatigue and dyspnea on exertion.  On his initial physical examination his blood pressure was 95/60, HR 104, RR 23 and 02 saturation 95% on 2 L/min, lungs with mild rales at bases with no wheezing or rhonchi, abdomen with no distention, no lower extremity edema.   Na 140, K 3,9 Cl 107 bicarbonate 19, glucose 113 bun 24 cr 0,94 AST 17 ALT 11 BNP 17,141 High sensitive troponin 100 and 96  Wbc 6,5 hgb 8,5 plt 260   Sars covid 19 negative  Influenza negative RSV negative   Chest radiograph with cardiomegaly, bilateral hilar vascular congestion, bilateral central interstitial infiltrates, with predominance lower lobes. Pacemaker defibrillator in place with one right atrial lead and biventricular leads. Positive neuro-stimulator in place   EKG 97 bpm, left axis deviation, qtc 531, left bundle branch block, ventricular paced rhythm with PAC and PVC, with no significant ST segment or T wave changes.

## 2023-10-09 NOTE — Assessment & Plan Note (Signed)
 Continue blood pressure monitoring  Hypovolemia on admission due to diarrhea

## 2023-10-09 NOTE — Assessment & Plan Note (Signed)
Last A1C 07/23/20 6.2%  Plan A1C  Sliding scale coverage  Farixga 10  mg daily 

## 2023-10-09 NOTE — Plan of Care (Signed)

## 2023-10-09 NOTE — Assessment & Plan Note (Signed)
No signs of exacerbation Continue oxymetry monitoring.

## 2023-10-09 NOTE — Progress Notes (Signed)
 Heart Failure Navigator Progress Note  Assessed for Heart & Vascular TOC clinic readiness.  Patient does not meet criteria due to Advanced Heart Failure Team patient of Dr. Elwyn Lade. .   Navigator will sign off at this time.    Rhae Hammock, BSN, Scientist, clinical (histocompatibility and immunogenetics) Only

## 2023-10-09 NOTE — Consult Note (Signed)
 Cardiology  Consult Note   Patient ID: William Villanueva MRN: [id number]; DOB: Nov 07, 1950   Admission date: 10/08/2023  Primary Care Provider: Rae Bugler, MD Primary Cardiologist: Alexandria Angel, MD  Primary Electrophysiologist:  Richardo Chandler, MD   Chief Complaint:  Shortness of Breath   Patient Profile:   William Villanueva is a 73 y.o. 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 Harn presents to the hospital today with his wife who was at the bedside.  He states that last Thursday he had his various stimulator implanted. After the procedure he was doing well. However yesterday he began to develop worsening diarrhea. And then later in the evening he began getting more short of breath than he usually does.  Because of the worsening diarrhea and his shortness of breath he presents to the emergency room. At the time of my evaluation he is comfortable and laying flat. He describes no need for excess pillows at night.  He does have shortness of breath with exertion which she has had since his diagnosis of heart failure. He is currently prescribed torsemide  40 mg twice daily but he has only been taking it once daily as he is concerned about peeing at night.   Past Medical History:  Diagnosis Date   AICD (automatic cardioverter/defibrillator) present    Arthritis    AV block, 2nd degree 2015   St. Jude Allure Quadra pulse generator [id number], model PM 3242   Back pain    Cardiomyopathy (HCC)    Nonischemic 45%.    CHF (congestive heart failure) (HCC)    Diabetes mellitus without complication (HCC)    Fracture of lumbar vertebra without spinal cord injury, sequela 10/09/2022   Gout    Hemochromatosis    History of kidney stones    Hypertension    Hypospadias June 13, 1950   born with   Nephrolithiasis    Pacemaker lead malfunction-elevated threshold RV lead 07/31/2014    Polycythemia vera(238.4)    Presence of permanent cardiac pacemaker     Past Surgical History:  Procedure Laterality Date   ANKLE SURGERY     BI-VENTRICULAR PACEMAKER INSERTION N/A 04/29/2014   Procedure: BI-VENTRICULAR PACEMAKER INSERTION (CRT-P);  Surgeon: Verona Goodwill, MD; Laterality: Left  St. Jude Allure Quadra pulse generator [id number], model [place] 2440   CARDIAC SURGERY     COLONOSCOPY N/A 02/15/2020   Procedure: COLONOSCOPY;  Surgeon: Genell Ken, MD;  Location: [facility] ENDOSCOPY;  Service: Gastroenterology;  Laterality: N/A;   ESOPHAGOGASTRODUODENOSCOPY (EGD) WITH PROPOFOL  N/A 02/15/2020   Procedure: ESOPHAGOGASTRODUODENOSCOPY (EGD) WITH PROPOFOL ;  Surgeon: Genell Ken, MD;  Location: [facility] ENDOSCOPY;  Service: Gastroenterology;  Laterality: N/A;   LEAD INSERTION N/A 04/28/2022   Procedure: LEAD INSERTION;  Surgeon: Verona Goodwill, MD;  Location: [facility] INVASIVE CV LAB;  Service: Cardiovascular;  Laterality: N/A;   PILONIDAL CYST EXCISION     POSTERIOR LAMINECTOMY / DECOMPRESSION LUMBAR SPINE     PPM GENERATOR CHANGEOUT N/A 04/28/2022   Procedure: PPM GENERATOR CHANGEOUT;  Surgeon: Verona Goodwill, MD;  Location: [facility] INVASIVE CV LAB;  Service: Cardiovascular;  Laterality: N/A;   RIGHT HEART CATH N/A 08/09/2023   Procedure: RIGHT HEART CATH;  Surgeon: Lauralee Poll, MD;  Location: [facility] INVASIVE CV LAB;  Service: Cardiovascular;  Laterality: N/A;   RIGHT/LEFT HEART CATH AND CORONARY ANGIOGRAPHY N/A 01/10/2023   Procedure: RIGHT/LEFT HEART CATH AND CORONARY ANGIOGRAPHY;  Surgeon:  Arleen Lacer, MD;  Location: [facility] INVASIVE CV LAB;  Service: Cardiovascular;  Laterality: N/A;   TONSILLECTOMY       Medications Prior to Admission: Prior to Admission medications   Medication Sig Start Date End Date Taking? Authorizing Provider  acetaminophen  (TYLENOL ) 500 MG tablet Take 1,000 mg by mouth every 6 (six) hours as needed for headache.    [provider]  allopurinol  (ZYLOPRIM ) 300 MG tablet  Take 1 tablet (300 mg total) by mouth daily. 04/18/23   Rice, Haig Levan, MD  Ascorbic Acid (VITAMIN C PO) Take 1 tablet by mouth daily.    [provider]  budesonide  (PULMICORT ) 0.5 MG/2ML nebulizer solution Take 2 mLs (0.5 mg total) by nebulization 2 (two) times daily. (RINSE MOUTH AFTER USE) Patient taking differently: Take 2 mLs by nebulization daily. 09/28/22   Diamond Formica, MD  cholestyramine  (QUESTRAN ) 4 GM/DOSE powder Take 4 g by mouth daily as needed (diarrhea). 03/22/22   [provider]  clotrimazole (LOTRIMIN) 1 % cream Apply 1 Application topically daily as needed (irritation). 06/25/23   [provider]  colestipol  (COLESTID ) 1 g tablet Take 2 g by mouth daily.    [provider]  Cyanocobalamin  (B-12 PO) Take 1 tablet by mouth daily.    [provider]  diclofenac  Sodium (VOLTAREN ) 1 % GEL Apply 2 g topically daily as needed (pain). Patient not taking: Reported on 09/26/2023    [provider]  dicyclomine  (BENTYL ) 20 MG tablet Take 20 mg by mouth 2 (two) times daily. 03/28/22   [provider]  fluorouracil (EFUDEX) 5 % cream 1 Application daily. Patient not taking: Reported on 09/26/2023 09/07/20   [provider]  HYDROcodone -acetaminophen  (NORCO/VICODIN) 5-325 MG tablet Take 1 tablet by mouth every 6 (six) hours as needed for moderate pain (pain score 4-6). 10/04/23   Cordie Deters, PA-C  insulin  degludec (TRESIBA FLEXTOUCH) 100 UNIT/ML SOPN FlexTouch Pen Inject 16 Units into the skin daily after breakfast.    [provider]  losartan  (COZAAR ) 25 MG tablet TAKE 1/2 TABLET BY MOUTH DAILY 03/23/23   Lenise Quince, MD  magnesium  gluconate (MAGONATE) 500 MG tablet Take 500 mg by mouth daily.    [provider]  metoprolol  succinate (TOPROL  XL) 25 MG 24 hr tablet Take 0.5 tablets (12.5 mg total) by mouth daily. 05/23/23   Conan December, PA-C  omeprazole  (PRILOSEC) 20 MG capsule Take  20 mg by mouth in the morning and at bedtime.    [provider]  oxybutynin  (DITROPAN  XL) 15 MG 24 hr tablet Take 15 mg by mouth in the morning. 02/06/22   [provider]  ruxolitinib  phosphate (JAKAFI ) 10 MG tablet Take 1 tablet (10 mg total) by mouth 2 (two) times daily. 08/02/23   Ivor Mars, MD  sodium bicarbonate  650 MG tablet Take 1 tablet (650 mg total) by mouth 2 (two) times daily. 12/24/22   Ezzard Holms, MD  tamsulosin  (FLOMAX ) 0.4 MG CAPS capsule Take 0.4 mg by mouth daily. 08/03/17   [provider]  torsemide  (DEMADEX ) 20 MG tablet Take 40 mg by mouth 2 (two) times daily.    [provider]     Allergies:    Allergies  Allergen Reactions   Advil [Ibuprofen] Itching   Keflex [Cephalexin] Diarrhea and Other (See Comments)    Caused C-diff, also    Wellbutrin [Bupropion] Hives and Other (See Comments)   Prednisone  Itching and Other (See Comments)  Abdominal pain, also    Temazepam  Other (See Comments)    Dizziness    Trazodone  And Nefazodone Other (See Comments)    Dizziness   Entresto  [Sacubitril -Valsartan ] Other (See Comments)    Unknown   Prozac [Fluoxetine Hcl] Itching    Social History:   Social History   Socioeconomic History   Marital status: Married    Spouse name: Devra Fontana   Number of children: 0   Years of education: Not on file   Highest education level: High school graduate  Occupational History   Occupation: Retired  Tobacco Use   Smoking status: Former    Current packs/day: 0.00    Average packs/day: 1 pack/day for 44.8 years (44.8 ttl pk-yrs)    Types: Cigarettes    Start date: 06/05/1968    Quit date: 04/05/2013    Years since quitting: 10.5    Passive exposure: Never   Smokeless tobacco: Never  Vaping Use   Vaping status: Never Used  Substance and Sexual Activity   Alcohol  use: Yes    Alcohol /week: 0.0 standard drinks of alcohol     Comment: rare   Drug use: No   Sexual activity: Not Currently   Other Topics Concern   Not on file  Social History Narrative   Lives at home with wife in a one story home.  Has no children.  Does not work.  Getting workman's comp.  Education: 4 years trade school.    Social Drivers of Corporate investment banker Strain: Low Risk  (02/06/2022)   Overall Financial Resource Strain (CARDIA)    Difficulty of Paying Living Expenses: Not very hard  Food Insecurity: No Food Insecurity (10/09/2023)   Hunger Vital Sign    Worried About Running Out of Food in the Last Year: Never true    Ran Out of Food in the Last Year: Never true  Transportation Needs: No Transportation Needs (10/09/2023)   PRAPARE - Administrator, Civil Service (Medical): No    Lack of Transportation (Non-Medical): No  Physical Activity: Not on file  Stress: Not on file  Social Connections: Moderately Integrated (10/09/2023)   Social Connection and Isolation Panel [NHANES]    Frequency of Communication with Friends and Family: More than three times a week    Frequency of Social Gatherings with Friends and Family: More than three times a week    Attends Religious Services: 1 to 4 times per year    Active Member of [employer] or Organizations: No    Attends Banker Meetings: Never    Marital Status: Married  Catering manager Violence: Not At Risk (10/09/2023)   Humiliation, Afraid, Rape, and Kick questionnaire    Fear of Current or Ex-Partner: No    Emotionally Abused: No    Physically Abused: No    Sexually Abused: No    Family History:   The patient's family history includes Diabetes in his sister; Heart disease in his maternal grandmother; Hypertension in his sister; Other in his father; Stroke in his mother.    Review of Systems: [y] = yes, [ ]  = no    General: Weight gain [ ] ; Weight loss [ ] ; Anorexia [ ] ; Fatigue [ ] ; Fever [ ] ; Chills [ ] ; Weakness [ ]   Cardiac: Chest pain/pressure [ ] ; Resting SOB [ y]; Exertional SOB [ y]; Orthopnea [ ] ; Pedal Edema [ ] ;  Palpitations [ ] ; Syncope [ ] ; Presyncope [ ] ; Paroxysmal nocturnal dyspnea[ ]   Pulmonary: Cough [ ] ;  Wheezing[ ] ; Hemoptysis[ ] ; Sputum [ ] ; Snoring [ ]   GI: Vomiting[ ] ; Dysphagia[ ] ; Melena[ ] ; Hematochezia [ ] ; Heartburn[ ] ; Abdominal pain [ ] ; Constipation [ ] ; Diarrhea [ ] ; BRBPR [ ]   GU: Hematuria[ ] ; Dysuria [ ] ; Nocturia[ ]   Vascular: Pain in legs with walking [ ] ; Pain in feet with lying flat [ ] ; Non-healing sores [ ] ; Stroke [ ] ; TIA [ ] ; Slurred speech [ ] ;  Neuro: Headaches[ ] ; Vertigo[ ] ; Seizures[ ] ; Paresthesias[ ] ;Blurred vision [ ] ; Diplopia [ ] ; Vision changes [ ]   Ortho/Skin: Arthritis [ ] ; Joint pain [ ] ; Muscle pain [ ] ; Joint swelling [ ] ; Back Pain [ ] ; Rash [ ]   Psych: Depression[ ] ; Anxiety[ ]   Heme: Bleeding problems [ ] ; Clotting disorders [ ] ; Anemia [ ]   Endocrine: Diabetes [ ] ; Thyroid  dysfunction[ ]   Physical Exam/Data:   Vitals:   10/09/23 0000 10/09/23 0046 10/09/23 0258 10/09/23 0528  BP: 95/60  (!) 112/52 (!) 113/59  Pulse: (!) 103  91 98  Resp: (!) 23  17 18   Temp:  98.9 F (37.2 C) 98.2 F (36.8 C) 97.7 F (36.5 C)  TempSrc:  Oral Oral Oral  SpO2: 95%  100% 94%  Weight:   106.3 kg 106.3 kg  Height:   5\' 10"  (1.778 m)     Intake/Output Summary (Last 24 hours) at 10/09/2023 0716 Last data filed at 10/09/2023 0541 Gross per 24 hour  Intake 500.35 ml  Output 900 ml  Net -399.65 ml   Filed Weights   10/08/23 1309 10/09/23 0258 10/09/23 0528  Weight: 104.3 kg 106.3 kg 106.3 kg   Body mass index is 33.63 kg/m.  General:  Well nourished, well developed, in no acute distress HEENT: normal Lymph: no adenopathy Neck: JVP 9 cm  Endocrine:  No thryomegaly Vascular: No carotid bruits; FA pulses 2+ bilaterally without bruits  Cardiac:  normal S1, S2; increased rate, regular rhythm Lungs:  + rales Abd: soft, nontender, no hepatomegaly  Ext: no peripheral edema Musculoskeletal:  No deformities, BUE and BLE strength normal and equal Skin: warm and  dry  Psych:  Normal affect    EKG:   EKG 10/08/2023 13:44- V paced with HR of 97 bpm with occasional PVCs. NO ST elevations.EKG obtained on 6/2 at 13:38 is without significant changes.   Relevant CV Studies:  06/2023 PYP Scan:   Myocardial uptake was negative for radiotracer uptake. The visual grade of myocardial uptake relative to the ribs was Grade 0 (No myocardial uptake and normal bone uptake).   Findings are not suggestive (Grade 0) of cardiac ATTR amyloidosis.   Prior study not available for comparison.   12/2022 ECHO:  1. LV systolic function is severely depressed with diffuse hypokinesis;  inferior, septal and apical akinesis. COmpared to echo from Sept 2023,  LVEF is worse . Left ventricular ejection fraction, by estimation, is  <20%. The left ventricle has severely  decreased function. The left ventricle demonstrates global hypokinesis.  The left ventricular internal cavity size was severely dilated.   2. Right ventricular systolic function is severely reduced. The right  ventricular size is normal. There is moderately elevated pulmonary artery  systolic pressure.   3. Left atrial size was moderately dilated.   4. Right atrial size was moderately dilated.   5. Trivial mitral valve regurgitation.   6. The aortic valve is normal in structure. Aortic valve regurgitation is  not visualized.   7. The inferior vena cava is  dilated in size with <50% respiratory  variability, suggesting right atrial pressure of 15 mmHg.    Laboratory Data:  Chemistry Recent Labs  Lab 10/08/23 1420 10/09/23 0258  NA 140 139  K 3.9 3.4*  CL 107 109  CO2 19* 19*  GLUCOSE 113* 131*  BUN 24* 25*  CREATININE 0.94 1.17  CALCIUM  8.4* 8.2*  GFRNONAA >60 >60  ANIONGAP 13 11    Recent Labs  Lab 10/08/23 1420  PROT 6.7  ALBUMIN  3.8  AST 17  ALT 11  ALKPHOS 82  BILITOT 0.6   Hematology Recent Labs  Lab 10/08/23 1420 10/09/23 0258  WBC 6.5 6.3  RBC 2.56* 2.43*  HGB 8.5* 8.0*   HCT 26.4* 25.5*  MCV 103.1* 104.9*  MCH 33.2 32.9  MCHC 32.2 31.4  RDW 18.7* 19.2*  PLT 260 281   Cardiac EnzymesNo results for input(s): "TROPONINI" in the last 168 hours. No results for input(s): "TROPIPOC" in the last 168 hours.  BNP Recent Labs  Lab 10/08/23 1420  PROBNP 17,141.0*    DDimer No results for input(s): "DDIMER" in the last 168 hours.  Radiology/Studies:  DG Chest 2 View Result Date: 10/08/2023 CLINICAL DATA:  Shortness of breath EXAM: CHEST - 2 VIEW COMPARISON:  Chest radiograph dated 01/08/2023 FINDINGS: Lines/tubes: Left chest wall ICD leads project over the right atrium and ventricle and tributary of the coronary sinus. Right chest wall stimulator device with lead terminating above the field of view. Lungs: Mildly low lung volumes. Increased bibasilar interstitial and patchy opacities. Pleura: Trace blunting of bilateral costophrenic angles. No pneumothorax. Heart/mediastinum: Enlarged cardiomediastinal silhouette. Bones: No acute osseous abnormality. IMPRESSION: 1. Increased bibasilar interstitial and patchy opacities, which may represent pulmonary edema or infection. 2. Trace blunting of bilateral costophrenic angles, which may represent trace pleural effusions. 3. Cardiomegaly. Electronically Signed   By: Limin  Xu M.D.   On: 10/08/2023 15:39    Assessment and Plan:   Mr. Duke Gibbons presents to the emergency department for shortness of breath among other symptoms. Cardiology consulted to assist with a diagnosis and management of his shortness of breath.  Mr. Rickman does have some elevated JVD (JVP 9 cm), however it does not appear to the the sole cause of his symptoms. His BNP is elevated, but his CXR does not definitely show over pulmonary edema on my read, rather soft tissue attenuation over the bases. He has baseline heart failure (NYHA Class III), however at the time of my evaluation he was laying down flat without difficulty breathing. I wonder if his anemia (hgb  8; Hct 25%) has tipped him over into feeling worse than he previously does. Due to his polycythemia vera he obtains phlebotomies ( and hydroxyurea , which appears to be the cause of his macrocytosis), but the goal Hct is <45%. Additionally, he recently had a barostim implant that perhaps could be related to his symptoms. His troponins are likely elevated in the setting of demand and less likely ACS. It is reasonable to diurese to see how much his symptoms improve.  - 80 IV Lasix   - Standing CXR  - Follow up Procalcitonin, and d dimer  - ECHO     Signed, Renelda Carry, MD  10/09/2023 7:16 AM

## 2023-10-09 NOTE — Assessment & Plan Note (Addendum)
 Calculated BMI is 33.6 consistent with obesity class 1

## 2023-10-09 NOTE — Assessment & Plan Note (Signed)
 Diarrhea started 24 hrs after implantation of barostim.   Today diarrhea has improved, he has no abdominal pain, no nausea or vomiting.  He continue very fatigued and deconditioned.  Positive dyspnea on exertion

## 2023-10-09 NOTE — Evaluation (Signed)
 Physical Therapy Evaluation Patient Details Name: William Villanueva MRN: [id number] DOB: 1950-08-21 Today's Date: 10/09/2023  History of Present Illness  Patient is 73 y.o. male presented to [facility] ED with SOB and diarrhea the night PTA; found to be hypoxic in 80's on RA, mildly volume overloaded and CXR revealed pulmonary edema. Pt recently s/p Barostim device implantation on 10/04/23 to treat his NYHA class III heart failure. 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 DM, gout, lumbar lami/decompression.   Clinical Impression  William Villanueva is 73 y.o. male admitted with above HPI and diagnosis. Patient is currently limited by functional impairments below (see PT problem list). Patient lives with spouse and is mod ind with [facility] for household and limited community mobility at baseline. Currently pt requires CGA for bed mob and transfers with heavy reliance on UE's to power up. Patient completed transfers and gait with no AD and IV pole; noted Lt drift and trendelenburg gait pattern with single UE support on IV pole. Pt reaching for additional support with free UE and education provided on safety benefits of using RW for ambulation to reduce fall risk. VSS with HR in 120's and pt on 2L/min with SpO2 92% or better. Patient will benefit from continued skilled PT interventions to address impairments and progress independence with mobility. Acute PT will follow and progress as able.         If plan is discharge home, recommend the following: A little help with walking and/or transfers;A little help with bathing/dressing/bathroom;Assistance with cooking/housework;Assist for transportation;Help with stairs or ramp for entrance   Can travel by private vehicle        Equipment Recommendations Wheelchair (measurements PT);Wheelchair cushion (measurements PT)  Recommendations for Other Services       Functional Status Assessment Patient has had a recent decline in  their functional status and demonstrates the ability to make significant improvements in function in a reasonable and predictable amount of time.     Precautions / Restrictions Precautions Precautions: Fall Recall of Precautions/Restrictions: Intact Precaution/Restrictions Comments: wife reports last fall after shower and he fell in bedroom trying to get dressed. maybe a year ago. Restrictions Weight Bearing Restrictions Per Provider Order: No      Mobility  Bed Mobility Overal bed mobility: Needs Assistance Bed Mobility: Supine to Sit     Supine to sit: Contact guard, HOB elevated, Used rails     General bed mobility comments: CGA for safety, assist for lines. pt using bed features. remained seated EOB with lab tech present for blood draw at EOS.    Transfers Overall transfer level: Needs assistance Equipment used: None Transfers: Sit to/from Stand Sit to Stand: Contact guard assist           General transfer comment: close CGA for safety, pt reliant on bil UE to power up from low bed height. reach back to control lowering.    Ambulation/Gait Ambulation/Gait assistance: Min Chemical engineer (Feet): 40 Feet Assistive device: IV Pole Gait Pattern/deviations: Step-through pattern, Decreased step length - right, Decreased step length - left, Decreased stride length, Trendelenburg, Drifts right/left, Staggering left Gait velocity: decr     General Gait Details: pt with Lt drift and lean during gait, no buckling or LOB however unsteady. pt using Rt UE support on IV pole to stabilize and reaching Lt UE out for additional support. cues to use bil UE on IV pole. pt HR in 120's and SpO2 >93% on 2L/min for  short gait bout in room, mild SOB.  Stairs            Wheelchair Mobility     Tilt Bed    Modified Rankin (Stroke Patients Only)       Balance Overall balance assessment: Needs assistance Sitting-balance support: Feet supported Sitting balance-Leahy  Scale: Good     Standing balance support: Reliant on assistive device for balance, Single extremity supported, Bilateral upper extremity supported, During functional activity Standing balance-Leahy Scale: Poor Standing balance comment: rec bil UE with RW for gait                             Pertinent Vitals/Pain      Home Living Family/patient expects to be discharged to:: Private residence Living Arrangements: Spouse/significant other Available Help at Discharge: Family Type of Home: House Home Access: Stairs to enter Entrance Stairs-Rails: Right;Left;Can reach both (none at front) Secretary/administrator of Steps: 6 or 7   Home Layout: One level Home Equipment: Agricultural consultant (2 wheels);Rollator (4 wheels);BSC/3in1;Cane - single point;Wheelchair - manual      Prior Function Prior Level of Function : Independent/Modified Independent;Driving             Mobility Comments: using SPC to mobilize in home, does not typically use the RW. Uses w[facility] for MD appointments. keeps WC in car. ADLs Comments: ind to dress self and shower at least every other day.     Extremity/Trunk Assessment   Upper Extremity Assessment Upper Extremity Assessment: Defer to OT evaluation    Lower Extremity Assessment Lower Extremity Assessment: Generalized weakness    Cervical / Trunk Assessment Cervical / Trunk Assessment: Kyphotic  Communication   Communication Communication: No apparent difficulties    Cognition Arousal: Alert Behavior During Therapy: WFL for tasks assessed/performed   PT - Cognitive impairments: No apparent impairments                         Following commands: Intact       Cueing Cueing Techniques: Verbal cues     General Comments      Exercises     Assessment/Plan    PT Assessment Patient needs continued PT services  PT Problem List Decreased strength;Decreased activity tolerance;Decreased balance;Decreased mobility;Decreased  knowledge of use of DME;Decreased safety awareness;Decreased knowledge of precautions;Cardiopulmonary status limiting activity;Obesity       PT Treatment Interventions DME instruction;Gait training;Stair training;Functional mobility training;Therapeutic activities;Therapeutic exercise;Balance training;Patient/family education    PT Goals (Current goals can be found in the Care Plan section)  Acute Rehab PT Goals Patient Stated Goal: decrease SOB, feel better PT Goal Formulation: With patient Time For Goal Achievement: 10/23/23 Potential to Achieve Goals: Good    Frequency Min 2X/week     Co-evaluation               AM-PAC PT "6 Clicks" Mobility  Outcome Measure Help needed turning from your back to your side while in a flat bed without using bedrails?: A Little Help needed moving from lying on your back to sitting on the side of a flat bed without using bedrails?: A Little Help needed moving to and from a bed to a chair (including a wheelchair)?: A Little Help needed standing up from a chair using your arms (e.g., wheelchair or bedside chair)?: A Little Help needed to walk in hospital room?: A Little Help needed climbing 3-5 steps with a railing? :  A Little 6 Click Score: 18    End of Session Equipment Utilized During Treatment: Gait belt;Oxygen  Activity Tolerance: Patient tolerated treatment well Patient left: in bed;with call bell/phone within reach;with family/visitor present;Other (comment) (seated EOB, lab tech in room) Nurse Communication: Mobility status PT Visit Diagnosis: Muscle weakness (generalized) (M62.81);Difficulty in walking, not elsewhere classified (R26.2);Other abnormalities of gait and mobility (R26.89);Unsteadiness on feet (R26.81)    Time: 1610-9604 PT Time Calculation (min) (ACUTE ONLY): 29 min   Charges:   PT Evaluation $PT Eval Moderate Complexity: 1 Mod PT Treatments $Gait Training: 8-22 mins PT General Charges $$ ACUTE PT VISIT: 1  Visit         Tish Forge, DPT Acute Rehabilitation Services Office [phone]  10/09/23 12:01 PM

## 2023-10-09 NOTE — Progress Notes (Signed)
 Progress Note   Patient: William Villanueva UJW:[id number] DOB: [birth year] DOA: 10/08/2023     0 DOS: the patient was seen and examined on 10/09/2023   Brief hospital course: Mr. Marinello was admitted to the hospital with the working diagnosis of heart failure exacerbation   73 yo male with the past medical history of heart failure, polycythemia, and obesity who presented with diarrhea, vomiting and cough for 1 day.  05/29 Placement of a right sided Barostim device.  On his initial physical examination his blood pressure was 95/60, HR 104, RR 23 and 02 saturation 95% on 2 L/min, lungs with mild rales at bases with no wheezing or rhonchi, abdomen with no distention, no lower extremity edema.   Na 140, K 3,9 Cl 107 bicarbonate 19, glucose 113 bun 24 cr 0,94 AST 17 ALT 11 BNP 17,141 High sensitive troponin 100 and 96  Wbc 6,5 hgb 8,5 plt 260   Sars covid 19 negative  Influenza negative RSV negative   Chest radiograph with cardiomegaly, bilateral hilar vascular congestion, bilateral central interstitial infiltrates, with predominance lower lobes. Pacemaker defibrillator in place with one right atrial lead and biventricular leads. Positive neuro-stimulator in place   EKG 97 bpm, left axis deviation, qtc 531, left bundle branch block, ventricular paced rhythm with PAC and PVC, with no significant ST segment or T wave changes.   Assessment and Plan: Diarrhea Diarrhea started 24 hrs after implantation of barostim.   Today diarrhea has improved, he has no abdominal pain, no nausea or vomiting.  He continue very fatigued and deconditioned.  Positive dyspnea on exertion   Acute on chronic systolic CHF (congestive heart failure) (HCC) 2024 echocardiogram with reduced LV systolic function EF less than 20%, global hypokinesis, severe dilatated cavity, RV systolic function with severe reduction, LA and RA with moderate dilatation, no significant valvular disease.  Possible hypovolemia due to  diarrhea.  Patient getting PRBC transfusion per cardiology along with 80 mg IV furosemide   Systolic blood pressure 100 mmHg range.    CKD stage 3a, GFR 45-59 ml/min (HCC) Hypokalemia  Renal function with serum cr at 1,17 with K at 3,4 and serum bicarbonate at 19  Na 139 and Mg 1,4   Continue electrolyte correction with Kcl and Mg sulfate Follow up renal function and electrolytes Avoid hypotension and nephrotoxic medications   COPD (chronic obstructive pulmonary disease) (HCC) No signs of exacerbation  Continue oxymetry monitoring   Polycythemia Patient has been on ruxolitinib . His hgb is 8,1  Follow cell counts.   Essential hypertension Continue blood pressure monitoring  Hypovolemia on admission due to diarrhea   DM2 (diabetes mellitus, type 2) (HCC) Continue glucose cover and monitoring with insulin  sliding scale   GERD (gastroesophageal reflux disease) Continue proton pump inhibitors   Obesity Calculated BMI is 33.6 consistent with obesity class 1    Subjective: Patient with no chest pain, dyspnea has improved but not back to baseline, no nausea or vomiting, no hematochezia or melena.   Physical Exam: Vitals:   10/09/23 1445 10/09/23 1508 10/09/23 1523 10/09/23 1538  BP:  (!) 94/44 107/61 (!) 102/49  Pulse:  (!) 106    Resp:  (!) 22    Temp: 98.7 F (37.1 C) 98.7 F (37.1 C) 98.1 F (36.7 C) 98.1 F (36.7 C)  TempSrc: Oral Oral Oral Oral  SpO2:      Weight:      Height:       Neurology awake and alert ENT with mild pallor with  no icterus Cardiovascular with S1 and S2 present and regular with no gallops, rubs or murmurs Respiratory with mild rales at bases with no wheezing or rhonchi  Abdomen protuberant, non tender and not distended Trace lower extremity edema    Data Reviewed:    Family Communication: no family at the bedside   Disposition: Status is: Inpatient Remains inpatient appropriate because: heart failure   Planned Discharge  Destination: Home     Author: Albertus Alt, MD 10/09/2023 3:42 PM  For on call review [email or web address].

## 2023-10-10 ENCOUNTER — Inpatient Hospital Stay (HOSPITAL_COMMUNITY)

## 2023-10-10 DIAGNOSIS — I509 Heart failure, unspecified: Secondary | ICD-10-CM | POA: Diagnosis not present

## 2023-10-10 DIAGNOSIS — I5023 Acute on chronic systolic (congestive) heart failure: Secondary | ICD-10-CM | POA: Diagnosis not present

## 2023-10-10 LAB — CBC
HCT: 26.6 % — ABNORMAL LOW (ref 39.0–52.0)
Hemoglobin: 8.6 g/dL — ABNORMAL LOW (ref 13.0–17.0)
MCH: 33.1 pg (ref 26.0–34.0)
MCHC: 32.3 g/dL (ref 30.0–36.0)
MCV: 102.3 fL — ABNORMAL HIGH (ref 80.0–100.0)
Platelets: 280 10*3/uL (ref 150–400)
RBC: 2.6 MIL/uL — ABNORMAL LOW (ref 4.22–5.81)
RDW: 18.8 % — ABNORMAL HIGH (ref 11.5–15.5)
WBC: 6.3 10*3/uL (ref 4.0–10.5)
nRBC: 0 % (ref 0.0–0.2)

## 2023-10-10 LAB — BASIC METABOLIC PANEL WITH GFR
Anion gap: 11 (ref 5–15)
BUN: 27 mg/dL — ABNORMAL HIGH (ref 8–23)
CO2: 19 mmol/L — ABNORMAL LOW (ref 22–32)
Calcium: 8.5 mg/dL — ABNORMAL LOW (ref 8.9–10.3)
Chloride: 104 mmol/L (ref 98–111)
Creatinine, Ser: 1.41 mg/dL — ABNORMAL HIGH (ref 0.61–1.24)
GFR, Estimated: 53 mL/min — ABNORMAL LOW (ref >60)
Glucose, Bld: 117 mg/dL — ABNORMAL HIGH (ref 70–99)
Potassium: 4.5 mmol/L (ref 3.5–5.1)
Sodium: 134 mmol/L — ABNORMAL LOW (ref 135–145)

## 2023-10-10 LAB — MAGNESIUM: Magnesium: 1.9 mg/dL (ref 1.7–2.4)

## 2023-10-10 LAB — GLUCOSE, CAPILLARY: Glucose-Capillary: 184 mg/dL — ABNORMAL HIGH (ref 70–99)

## 2023-10-10 MED ORDER — FUROSEMIDE 10 MG/ML IJ SOLN
40.0000 mg | Freq: Once | INTRAMUSCULAR | Status: AC
  Administered 2023-10-10: 40 mg via INTRAVENOUS
  Filled 2023-10-10: qty 4

## 2023-10-10 NOTE — Progress Notes (Addendum)
 PROGRESS NOTE    William Villanueva  ZOX:[id number] DOB: [birth year] DOA: 10/08/2023 PCP: Rae Bugler, MD  73/M with chronic systolic CHF, NICM, CHB with CRT-D, CKD 3, hypertension presented with nausea vomiting and diarrhea. - Recently underwent right sided Barostim device placement 5/29 -In the ED, creatinine 0.9, BUN 24, BNP 17141, troponin 100, 96, hemoglobin 8.5, flu COVID/RSV negative, chest x-ray with pulmonary vascular congestion bilateral central interstitial infiltrates - Echo noted EF 20-25%, with regional wall motion abnormality, normal RV, mild MR  Subjective: Feels okay today, denies any further diarrhea or vomiting  Assessment and Plan:  Diarrhea -Developed nausea and diarrhea 24 hours after Barostim, either secondary to excess stimulation or antibiotics administered for procedure  -GI symptoms appear to have resolved  - Supportive care - Increase activity, PT OT  Acute on chronic systolic CHF (congestive heart failure) (HCC) -Echo this admission with EF 20-25%, normal RV -Appears close to euvolemic, volume status is difficult to assess -Advanced heart failure team following, losartan  and metoprolol  on hold -May need RHC -GDMT limited by soft BP  CKD stage 3a, GFR 45-59 ml/min (HCC) Hypokalemia - Mild uptrend, monitor  Acute on chronic anemia - Hemoglobin dropped from 12 range few months ago to 8.5 now - Transfused 1 unit of PRBC on admission, hemoglobin in the 8 range, he denies overt blood loss - Anemia panel suggestive of chronic disease - h/o P vera, Suspect ruxolitinib  is contributing, will discuss with Dr. 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 Hemoglobin has been trending down this year, transfuse 1 unit PRBC  Essential hypertension Hypovolemia on admission due to diarrhea   DM2 (diabetes mellitus, type 2) (HCC) Stable, SSI  GERD (gastroesophageal reflux  disease) Continue proton pump inhibitors   Obesity Calculated BMI is 33.6 consistent with obesity class 1  DVT prophylaxis: lovenox  Code Status: Full Code Family Communication: None present Disposition Plan: Home in 1-2d  Consultants: CHF team   Procedures:   Antimicrobials:    Objective: Vitals:   10/10/23 0334 10/10/23 0500 10/10/23 0752 10/10/23 1152  BP: (!) 99/56  (!) 100/46 (!) 98/41  Pulse: 98  96 100  Resp: 17  20 17   Temp: 97.7 F (36.5 C)  97.7 F (36.5 C) 98 F (36.7 C)  TempSrc: Oral  Oral Oral  SpO2: 100%  99% 96%  Weight:  106.3 kg    Height:        Intake/Output Summary (Last 24 hours) at 10/10/2023 1229 Last data filed at 10/10/2023 0035 Gross per 24 hour  Intake 939 ml  Output 1300 ml  Net -361 ml   Filed Weights   10/09/23 0258 10/09/23 0528 10/10/23 0500  Weight: 106.3 kg 106.3 kg 106.3 kg    Examination:  General exam: Appears calm and comfortable chronically ill HEENT: Positive JVD Respiratory system: Clear to auscultation Cardiovascular system: S1 & S2 heard, RRR.  Abd: nondistended, soft and nontender.Normal bowel sounds heard. Central nervous system: Alert and oriented. No focal neurological deficits. Extremities: no edema Skin: No rashes Psychiatry: Flat affect    Data Reviewed:   CBC: Recent Labs  Lab 10/08/23 1420 10/09/23 0258 10/09/23 0646 10/09/23 1401 10/10/23 0232  WBC 6.5 6.3  --   --  6.3  HGB 8.5* 8.0* 7.9* 8.1* 8.6*  HCT 26.4* 25.5* 24.6* 25.3* 26.6*  MCV 103.1* 104.9*  --   --  102.3*  PLT 260 281  --   --  280   Basic Metabolic Panel: Recent Labs  Lab 10/08/23 1420 10/09/23 0258 10/10/23 0232  NA 140 139 134*  K 3.9 3.4* 4.5  CL 107 109 104  CO2 19* 19* 19*  GLUCOSE 113* 131* 117*  BUN 24* 25* 27*  CREATININE 0.94 1.17 1.41*  CALCIUM  8.4* 8.2* 8.5*  MG  --  1.4* 1.9  PHOS  --  3.3  --    GFR: Estimated Creatinine Clearance: 57.8 mL/min (A) (by C-G formula based on SCr of 1.41 mg/dL  (H)). Liver Function Tests: Recent Labs  Lab 10/08/23 1420  AST 17  ALT 11  ALKPHOS 82  BILITOT 0.6  PROT 6.7  ALBUMIN  3.8   No results for input(s): "LIPASE", "AMYLASE" in the last 168 hours. No results for input(s): "AMMONIA" in the last 168 hours. Coagulation Profile: No results for input(s): "INR", "PROTIME" in the last 168 hours. Cardiac Enzymes: No results for input(s): "CKTOTAL", "CKMB", "CKMBINDEX", "TROPONINI" in the last 168 hours. BNP (last 3 results) Recent Labs    08/28/23 0909 10/08/23 1420  PROBNP 6,803.0* 17,141.0*   HbA1C: No results for input(s): "HGBA1C" in the last 72 hours. CBG: Recent Labs  Lab 10/04/23 0815 10/04/23 1126 10/04/23 1156  GLUCAP 107* 113* 130*   Lipid Profile: No results for input(s): "CHOL", "HDL", "LDLCALC", "TRIG", "CHOLHDL", "LDLDIRECT" in the last 72 hours. Thyroid  Function Tests: No results for input(s): "TSH", "T4TOTAL", "FREET4", "T3FREE", "THYROIDAB" in the last 72 hours. Anemia Panel: Recent Labs    10/09/23 0424  FERRITIN 854*  TIBC 231*  IRON 71   Urine analysis:    Component Value Date/Time   COLORURINE YELLOW 09/20/2023 1343   APPEARANCEUR CLEAR 09/20/2023 1343   LABSPEC 1.012 09/20/2023 1343   LABSPEC 1.020 10/08/2015 0903   PHURINE 5.0 09/20/2023 1343   GLUCOSEU NEGATIVE 09/20/2023 1343   HGBUR NEGATIVE 09/20/2023 1343   BILIRUBINUR NEGATIVE 09/20/2023 1343   KETONESUR NEGATIVE 09/20/2023 1343   PROTEINUR NEGATIVE 09/20/2023 1343   UROBILINOGEN 0.2 10/08/2015 0903   NITRITE NEGATIVE 09/20/2023 1343   LEUKOCYTESUR NEGATIVE 09/20/2023 1343   Sepsis Labs: @LABRCNTIP (procalcitonin:4,lacticidven:4)  ) Recent Results (from the past 240 hours)  Resp panel by RT-PCR (RSV, Flu A&B, Covid) Anterior Nasal Swab     Status: None   Collection Time: 10/08/23  2:20 PM   Specimen: Anterior Nasal Swab  Result Value Ref Range Status   SARS Coronavirus 2 by RT PCR NEGATIVE NEGATIVE Final    Comment:  (NOTE) SARS-CoV-2 target nucleic acids are NOT DETECTED.  The SARS-CoV-2 RNA is generally detectable in upper respiratory specimens during the acute phase of infection. The lowest concentration of SARS-CoV-2 viral copies this assay can detect is 138 copies/mL. A negative result does not preclude SARS-Cov-2 infection and should not be used as the sole basis for treatment or other patient management decisions. A negative result may occur with  improper specimen collection/handling, submission of specimen other than nasopharyngeal swab, presence of viral mutation(s) within the areas targeted by this assay, and inadequate number of viral copies(<138 copies/mL). A negative result must be combined with clinical observations, patient history, and epidemiological information. The expected result is Negative.  Fact Sheet for Patients:  BloggerCourse.com  Fact Sheet for Healthcare Providers:  SeriousBroker.it  This test is no t yet approved or cleared by the [place]  FDA and  has been authorized for detection and/or diagnosis of SARS-CoV-2 by FDA under an Emergency Use Authorization (EUA). This EUA will remain  in effect (meaning  this test can be used) for the duration of the COVID-19 declaration under Section 564(b)(1) of the Act, 21 U.S.C.section 360bbb-3(b)(1), unless the authorization is terminated  or revoked sooner.       Influenza A by PCR NEGATIVE NEGATIVE Final   Influenza B by PCR NEGATIVE NEGATIVE Final    Comment: (NOTE) The Xpert Xpress SARS-CoV-2/FLU/RSV plus assay is intended as an aid in the diagnosis of influenza from Nasopharyngeal swab specimens and should not be used as a sole basis for treatment. Nasal washings and aspirates are unacceptable for Xpert Xpress SARS-CoV-2/FLU/RSV testing.  Fact Sheet for Patients: BloggerCourse.com  Fact Sheet for Healthcare  Providers: SeriousBroker.it  This test is not yet approved or cleared by the [place]  FDA and has been authorized for detection and/or diagnosis of SARS-CoV-2 by FDA under an Emergency Use Authorization (EUA). This EUA will remain in effect (meaning this test can be used) for the duration of the COVID-19 declaration under Section 564(b)(1) of the Act, 21 U.S.C. section 360bbb-3(b)(1), unless the authorization is terminated or revoked.     Resp Syncytial Virus by PCR NEGATIVE NEGATIVE Final    Comment: (NOTE) Fact Sheet for Patients: BloggerCourse.com  Fact Sheet for Healthcare Providers: SeriousBroker.it  This test is not yet approved or cleared by the [place]  FDA and has been authorized for detection and/or diagnosis of SARS-CoV-2 by FDA under an Emergency Use Authorization (EUA). This EUA will remain in effect (meaning this test can be used) for the duration of the COVID-19 declaration under Section 564(b)(1) of the Act, 21 U.S.C. section 360bbb-3(b)(1), unless the authorization is terminated or revoked.  Performed at [facility], [address] [place], [place] [address]   Blood culture (routine x 2)     Status: None (Preliminary result)   Collection Time: 10/08/23  6:14 PM   Specimen: BLOOD  Result Value Ref Range Status   Specimen Description   Final    BLOOD RIGHT ANTECUBITAL Performed at [facility], [address] [place], [place] [address]    Special Requests   Final    BOTTLES DRAWN AEROBIC AND ANAEROBIC Blood Culture results may not be optimal due to an excessive volume of blood received in culture bottles Performed at [facility], [address] [place], [place] [address]    Culture   Final    NO GROWTH < 12 HOURS Performed at [facility], [address] [place], [place] [address]    Report Status PENDING  Incomplete      Radiology Studies: DG CHEST PORT 1 VIEW Result Date: 10/10/2023 CLINICAL DATA:  Heart failure EXAM: PORTABLE CHEST 1 VIEW COMPARISON:  October 08, 2023 FINDINGS: Heart normal size. Bilateral pulmonary interstitial infiltrates correlate with congestive changes without significant pleural effusions No consolidations No change in the left subclavian pacemaker device No change in the right hemithorax stimulator pacer device with the electrode traveling to the lower neck. IMPRESSION: Mild congestive changes.  Mild bilateral congestive changes Electronically Signed   By: Fredrich Jefferson M.D.   On: 10/10/2023 09:35   ECHOCARDIOGRAM COMPLETE Result Date: 10/09/2023    ECHOCARDIOGRAM REPORT   Patient Name:   William Villanueva Date of Exam: 10/09/2023 Medical Rec #:  [id number]          Height:       70.0 in Accession #:    [id number]         Weight:  234.3 lb Date of Birth:  12/04/1950           BSA:          2.233 m Patient Age:    73 years           BP:           107/47 mmHg Patient Gender: M                  HR:           105 bpm. Exam Location:  Inpatient Procedure: 2D Echo, Cardiac Doppler, Color Doppler and Intracardiac            Opacification Agent (Both Spectral and Color Flow Doppler were            utilized during procedure). Indications:    CHF  History:        Patient has prior history of Echocardiogram examinations, most                 recent 12/22/2022. Risk Factors:Hypertension.  Sonographer:    Janette Medley Referring Phys: [id number] CAROLE N HALL IMPRESSIONS  1. Left ventricular ejection fraction, by estimation, is 20 to 25%. The left ventricle has severely decreased function. The left ventricle demonstrates regional wall motion abnormalities (see scoring diagram/findings for description). The left ventricular internal cavity size was moderately dilated. Indeterminate diastolic filling due to E-A fusion.  2. Right ventricular systolic function is normal. The right ventricular size is normal. Tricuspid  regurgitation signal is inadequate for assessing PA pressure.  3. Left atrial size was moderately dilated.  4. The mitral valve is normal in structure. Mild mitral valve regurgitation. No evidence of mitral stenosis.  5. The aortic valve is tricuspid. Aortic valve regurgitation is not visualized. No aortic stenosis is present.  6. The inferior vena cava is dilated in size with >50% respiratory variability, suggesting right atrial pressure of 8 mmHg. Comparison(s): No significant change from prior study. Prior images reviewed side by side. FINDINGS  Left Ventricle: There is no left ventricular thrombus (Definity  contrast was used). There is global hypokinesis, but the inferior and inferolateral walss appear akinetic, consistent with infarction/scar in the right cooronary artery distribution. Left ventricular ejection fraction, by estimation, is 20 to 25%. The left ventricle has severely decreased function. The left ventricle demonstrates regional wall motion abnormalities. The left ventricular internal cavity size was moderately dilated. There is  no left ventricular hypertrophy. Indeterminate diastolic filling due to E-A fusion. Right Ventricle: The right ventricular size is normal. No increase in right ventricular wall thickness. Right ventricular systolic function is normal. Tricuspid regurgitation signal is inadequate for assessing PA pressure. Left Atrium: Left atrial size was moderately dilated. Right Atrium: Right atrial size was normal in size. Pericardium: There is no evidence of pericardial effusion. Mitral Valve: The mitral valve is normal in structure. Mild to moderate mitral annular calcification. Mild mitral valve regurgitation. No evidence of mitral valve stenosis. Tricuspid Valve: The tricuspid valve is normal in structure. Tricuspid valve regurgitation is trivial. Aortic Valve: The aortic valve is tricuspid. Aortic valve regurgitation is not visualized. No aortic stenosis is present. Pulmonic Valve:  The pulmonic valve was not well visualized. Pulmonic valve regurgitation is not visualized. No evidence of pulmonic stenosis. Aorta: The aortic root and ascending aorta are structurally normal, with no evidence of dilitation. Venous: The inferior vena cava is dilated in size with greater than 50% respiratory variability, suggesting right atrial pressure of 8 mmHg. IAS/Shunts: No  atrial level shunt detected by color flow Doppler. Additional Comments: A device lead is visualized in the right ventricle and right atrium.  LEFT VENTRICLE PLAX 2D LVIDd:         6.50 cm      Diastology LVIDs:         5.80 cm      LV e' medial:   4.57 cm/s LV PW:         0.90 cm      LV E/e' medial: 25.4 LV IVS:        1.10 cm LVOT diam:     2.30 cm LV SV:         79 LV SV Index:   35 LVOT Area:     4.15 cm  LV Volumes (MOD) LV vol d, MOD A2C: 270.0 ml LV vol d, MOD A4C: 207.0 ml LV vol s, MOD A2C: 177.0 ml LV vol s, MOD A4C: 131.0 ml LV SV MOD A2C:     93.0 ml LV SV MOD A4C:     207.0 ml LV SV MOD BP:      83.1 ml RIGHT VENTRICLE             IVC RV S prime:     16.00 cm/s  IVC diam: 2.40 cm TAPSE (M-mode): 2.9 cm LEFT ATRIUM             Index        RIGHT ATRIUM           Index LA diam:        4.10 cm 1.84 cm/m   RA Area:     17.90 cm LA Vol (A2C):   73.9 ml 33.10 ml/m  RA Volume:   48.60 ml  21.77 ml/m LA Vol (A4C):   66.5 ml 29.78 ml/m LA Biplane Vol: 71.9 ml 32.20 ml/m  AORTIC VALVE LVOT Vmax:   127.00 cm/s LVOT Vmean:  87.000 cm/s LVOT VTI:    0.189 m  AORTA Ao Root diam: 3.30 cm Ao Asc diam:  3.80 cm MITRAL VALVE MV Area (PHT): 4.41 cm     SHUNTS MV Decel Time: 172 msec     Systemic VTI:  0.19 m MV E velocity: 116.00 cm/s  Systemic Diam: 2.30 cm Karyl Paget Croitoru MD Electronically signed by Luana Rumple MD Signature Date/Time: 10/09/2023/4:01:55 PM    Final    DG Chest 2 View Result Date: 10/08/2023 CLINICAL DATA:  Shortness of breath EXAM: CHEST - 2 VIEW COMPARISON:  Chest radiograph dated 01/08/2023 FINDINGS: Lines/tubes:  Left chest wall ICD leads project over the right atrium and ventricle and tributary of the coronary sinus. Right chest wall stimulator device with lead terminating above the field of view. Lungs: Mildly low lung volumes. Increased bibasilar interstitial and patchy opacities. Pleura: Trace blunting of bilateral costophrenic angles. No pneumothorax. Heart/mediastinum: Enlarged cardiomediastinal silhouette. Bones: No acute osseous abnormality. IMPRESSION: 1. Increased bibasilar interstitial and patchy opacities, which may represent pulmonary edema or infection. 2. Trace blunting of bilateral costophrenic angles, which may represent trace pleural effusions. 3. Cardiomegaly. Electronically Signed   By: Limin  Xu M.D.   On: 10/08/2023 15:39     Scheduled Meds:  allopurinol   300 mg Oral Daily   budesonide   2 mL Nebulization BID   enoxaparin  (LOVENOX ) injection  50 mg Subcutaneous Q24H   oxybutynin   15 mg Oral q AM   pantoprazole   40 mg Oral Daily   rosuvastatin   5 mg Oral Daily  tamsulosin   0.4 mg Oral Daily   Continuous Infusions:   LOS: 1 day    Time spent:    Deforest Fast, MD [facility]   10/10/2023, 12:29 PM

## 2023-10-10 NOTE — Plan of Care (Signed)

## 2023-10-10 NOTE — Evaluation (Signed)
 Occupational Therapy Evaluation Patient Details Name: William Villanueva MRN: [id number] DOB: 04-Aug-1950 Today's Date: 10/10/2023   History of Present Illness   Patient is 73 y.o. male presented to [facility] ED with SOB and diarrhea the night PTA; found to be hypoxic in 80's on RA, mildly volume overloaded and CXR revealed pulmonary edema. Pt recently s/p Barostim device implantation on 10/04/23 to treat his NYHA class III heart failure. 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 DM, gout, lumbar lami/decompression.     Clinical Impressions Pt typically walks with a cane and is independent in self care. He does mow his lawn on a riding mower. Pt reports decline in his overall functioning since implantation of Barostim device on 5/29. Pt presents with generalized weakness, decreased activity tolerance and impaired standing balance. BP was 98/41, so mobility limited to bed to chair with CGA. Pt requires increased time for mobility and set up to CGA for ADLs. He may benefit from seated showering for energy conservation when he returns home. Will follow acutely. Recommend HHOT.      If plan is discharge home, recommend the following:   A little help with walking and/or transfers;A little help with bathing/dressing/bathroom;Assistance with cooking/housework;Assist for transportation     Functional Status Assessment   Patient has had a recent decline in their functional status and demonstrates the ability to make significant improvements in function in a reasonable and predictable amount of time.     Equipment Recommendations         Recommendations for Other Services         Precautions/Restrictions   Precautions Precautions: Fall Recall of Precautions/Restrictions: Intact Restrictions Weight Bearing Restrictions Per Provider Order: No     Mobility Bed Mobility Overal bed mobility: Modified Independent             General bed  mobility comments: side lying to sit without assist    Transfers Overall transfer level: Needs assistance Equipment used: None Transfers: Sit to/from Stand, Bed to chair/wheelchair/BSC Sit to Stand: Contact guard assist     Step pivot transfers: Contact guard assist     General transfer comment: bed to chair with increased time      Balance Overall balance assessment: Needs assistance   Sitting balance-Leahy Scale: Good       Standing balance-Leahy Scale: Poor Standing balance comment: reaching for arms of chair with transfer                           ADL either performed or assessed with clinical judgement   ADL Overall ADL's : Needs assistance/impaired Eating/Feeding: Independent;Sitting   Grooming: Set up;Sitting;Wash/dry hands;Wash/dry face   Upper Body Bathing: Minimal assistance;Sitting Upper Body Bathing Details (indicate cue type and reason): assisted to wash back Lower Body Bathing: Contact guard assist;Sit to/from stand   Upper Body Dressing : Set up;Sitting Upper Body Dressing Details (indicate cue type and reason): changed gown Lower Body Dressing: Contact guard assist;Sit to/from stand                       Vision Baseline Vision/History: 1 Wears glasses Ability to See in Adequate Light: 0 Adequate Patient Visual Report: No change from baseline       Perception         Praxis         Pertinent Vitals/Pain Pain Assessment Pain Assessment: Faces Faces Pain Scale: Hurts  a little bit Pain Location: chronic back Pain Descriptors / Indicators: Discomfort Pain Intervention(s): Monitored during session, Repositioned     Extremity/Trunk Assessment Upper Extremity Assessment Upper Extremity Assessment: Overall WFL for tasks assessed   Lower Extremity Assessment Lower Extremity Assessment: Defer to PT evaluation   Cervical / Trunk Assessment Cervical / Trunk Assessment: Kyphotic;Other exceptions (chronic back pain)    Communication Communication Communication: No apparent difficulties   Cognition Arousal: Alert Behavior During Therapy: WFL for tasks assessed/performed Cognition: No apparent impairments                               Following commands: Intact       Cueing  General Comments   Cueing Techniques: Verbal cues      Exercises     Shoulder Instructions      Home Living Family/patient expects to be discharged to:: Private residence Living Arrangements: Spouse/significant other Available Help at Discharge: Family;Available 24 hours/day Type of Home: House Home Access: Stairs to enter [employer] of Steps: 6 or 7 Entrance Stairs-Rails: Right;Left;Can reach both Home Layout: One level     Bathroom Shower/Tub: Chief Strategy Officer: Standard     Home Equipment: Agricultural consultant (2 wheels);Rollator (4 wheels);BSC/3in1;Cane - single point;Wheelchair - manual          Prior Functioning/Environment Prior Level of Function : Independent/Modified Independent;Driving             Mobility Comments: using SPC to mobilize in home, does not typically use the RW. Uses wc for MD appointments. keeps WC in car. ADLs Comments: ind to dress self and shower at least every other day, mows with riding mower    OT Problem List: Decreased activity tolerance;Impaired balance (sitting and/or standing);Pain;Cardiopulmonary status limiting activity   OT Treatment/Interventions: Self-care/ADL training;DME and/or AE instruction;Energy conservation;Therapeutic activities;Patient/family education;Balance training      OT Goals(Current goals can be found in the care plan section)   Acute Rehab OT Goals OT Goal Formulation: With patient Time For Goal Achievement: 10/24/23 Potential to Achieve Goals: Good ADL Goals Pt Will Perform Grooming: with modified independence;standing Pt Will Perform Lower Body Bathing: with modified independence;sit to/from  stand Pt Will Perform Lower Body Dressing: with modified independence;sit to/from stand Pt Will Transfer to Toilet: with modified independence;ambulating Pt Will Perform Toileting - Clothing Manipulation and hygiene: with modified independence;sit to/from stand Pt Will Perform Tub/Shower Transfer: with modified independence;ambulating (educate pt in availability of tub equipment for seated showering) Additional ADL Goal #1: Pt will generalize energy conservation strategies in ADLs and mobility.   OT Frequency:  Min 2X/week    Co-evaluation              AM-PAC OT "6 Clicks" Daily Activity     Outcome Measure Help from another person eating meals?: None Help from another person taking care of personal grooming?: A Little Help from another person toileting, which includes using toliet, bedpan, or urinal?: A Little Help from another person bathing (including washing, rinsing, drying)?: A Little Help from another person to put on and taking off regular upper body clothing?: A Little Help from another person to put on and taking off regular lower body clothing?: A Little 6 Click Score: 19   End of Session Equipment Utilized During Treatment: Gait belt Nurse Communication: Mobility status;Other (comment) (aware of soft BP)  Activity Tolerance: Treatment limited secondary to medical complications (Comment) (pt with BP of  98/41) Patient left: in chair;with call bell/phone within reach  OT Visit Diagnosis: Unsteadiness on feet (R26.81);Muscle weakness (generalized) (M62.81);Other (comment) (decreased activity tolerance)                Time: 1610-9604 OT Time Calculation (min): 32 min Charges:  OT General Charges $OT Visit: 1 Visit OT Evaluation $OT Eval Moderate Complexity: 1 Mod OT Treatments $Self Care/Home Management : 8-22 mins  Avanell Leigh, OTR/L Acute Rehabilitation Services Office: [phone]   Jonette Nestle 10/10/2023, 1:40 PM

## 2023-10-10 NOTE — Progress Notes (Signed)
 Advanced Heart Failure Rounding Note  Cardiologist: Alexandria Angel, MD  Chief Complaint: Heart Failure  Subjective:   6/3 1UPRBC. Diuresed with IV lasix . Sluggish diuresis noted.   SOB with exertion. No diarrhea overnight.   Objective:   Weight Range: 106.3 kg Body mass index is 33.63 kg/m.   Vital Signs:   Temp:  [97.6 F (36.4 C)-98.7 F (37.1 C)] 97.7 F (36.5 C) (06/04 0752) Pulse Rate:  [96-106] 96 (06/04 0752) Resp:  [16-22] 20 (06/04 0752) BP: (94-108)/(44-61) 100/46 (06/04 0752) SpO2:  [94 %-100 %] 99 % (06/04 0752) Weight:  [106.3 kg] 106.3 kg (06/04 0500) Last BM Date : 10/09/23  Weight change: Filed Weights   10/09/23 0258 10/09/23 0528 10/10/23 0500  Weight: 106.3 kg 106.3 kg 106.3 kg    Intake/Output:   Intake/Output Summary (Last 24 hours) at 10/10/2023 0800 Last data filed at 10/10/2023 0035 Gross per 24 hour  Intake 1059 ml  Output 1700 ml  Net -641 ml      Physical Exam   General:  Dyspneic moving in the bed.  Neck: supple. R neck ecchymotic. Incision approximated. JVP 10-11  Cor: PMI nondisplaced. Regular rate & rhythm. No rubs, gallops or murmurs. Lungs: clear on 5 liters [place]. Abdomen: soft, nontender, nondistended.  Extremities: no cyanosis, clubbing, rash, edema Neuro: alert & oriented x3   Telemetry  SR   EKG    N/A  Labs    CBC Recent Labs    10/09/23 0258 10/09/23 0646 10/09/23 1401 10/10/23 0232  WBC 6.3  --   --  6.3  HGB 8.0*   < > 8.1* 8.6*  HCT 25.5*   < > 25.3* 26.6*  MCV 104.9*  --   --  102.3*  PLT 281  --   --  280   < > = values in this interval not displayed.   Basic Metabolic Panel Recent Labs    16/10/96 0258 10/10/23 0232  NA 139 134*  K 3.4* 4.5  CL 109 104  CO2 19* 19*  GLUCOSE 131* 117*  BUN 25* 27*  CREATININE 1.17 1.41*  CALCIUM  8.2* 8.5*  MG 1.4* 1.9  PHOS 3.3  --    Liver Function Tests Recent Labs    10/08/23 1420  AST 17  ALT 11  ALKPHOS 82  BILITOT 0.6  PROT 6.7   ALBUMIN  3.8   No results for input(s): "LIPASE", "AMYLASE" in the last 72 hours. Cardiac Enzymes No results for input(s): "CKTOTAL", "CKMB", "CKMBINDEX", "TROPONINI" in the last 72 hours.  BNP: BNP (last 3 results) Recent Labs    12/21/22 1922 01/08/23 1801 07/18/23 1244  BNP 889.4* 1,183.7* 612.8*    ProBNP (last 3 results) Recent Labs    08/28/23 0909 10/08/23 1420  PROBNP 6,803.0* 17,141.0*     D-Dimer Recent Labs    10/09/23 0813  DDIMER 2.52*   Hemoglobin A1C No results for input(s): "HGBA1C" in the last 72 hours. Fasting Lipid Panel No results for input(s): "CHOL", "HDL", "LDLCALC", "TRIG", "CHOLHDL", "LDLDIRECT" in the last 72 hours. Thyroid  Function Tests No results for input(s): "TSH", "T4TOTAL", "T3FREE", "THYROIDAB" in the last 72 hours.  Invalid input(s): "FREET3"  Other results:   Imaging    ECHOCARDIOGRAM COMPLETE Result Date: 10/09/2023    ECHOCARDIOGRAM REPORT   Patient Name:   William Villanueva Date of Exam: 10/09/2023 Medical Rec #:  [id number]          Height:       70.0  in Accession #:    [id number]         Weight:       234.3 lb Date of Birth:  [birth year]           BSA:          2.233 m Patient Age:    73 years           BP:           107/47 mmHg Patient Gender: M                  HR:           105 bpm. Exam Location:  Inpatient Procedure: 2D Echo, Cardiac Doppler, Color Doppler and Intracardiac            Opacification Agent (Both Spectral and Color Flow Doppler were            utilized during procedure). Indications:    CHF  History:        Patient has prior history of Echocardiogram examinations, most                 recent 12/22/2022. Risk Factors:Hypertension.  Sonographer:    Janette Medley Referring Phys: [id number] CAROLE N HALL IMPRESSIONS  1. Left ventricular ejection fraction, by estimation, is 20 to 25%. The left ventricle has severely decreased function. The left ventricle demonstrates regional wall motion abnormalities (see scoring  diagram/findings for description). The left ventricular internal cavity size was moderately dilated. Indeterminate diastolic filling due to E-A fusion.  2. Right ventricular systolic function is normal. The right ventricular size is normal. Tricuspid regurgitation signal is inadequate for assessing PA pressure.  3. Left atrial size was moderately dilated.  4. The mitral valve is normal in structure. Mild mitral valve regurgitation. No evidence of mitral stenosis.  5. The aortic valve is tricuspid. Aortic valve regurgitation is not visualized. No aortic stenosis is present.  6. The inferior vena cava is dilated in size with >50% respiratory variability, suggesting right atrial pressure of 8 mmHg. Comparison(s): No significant change from prior study. Prior images reviewed side by side. FINDINGS  Left Ventricle: There is no left ventricular thrombus (Definity  contrast was used). There is global hypokinesis, but the inferior and inferolateral walss appear akinetic, consistent with infarction/scar in the right cooronary artery distribution. Left ventricular ejection fraction, by estimation, is 20 to 25%. The left ventricle has severely decreased function. The left ventricle demonstrates regional wall motion abnormalities. The left ventricular internal cavity size was moderately dilated. There is  no left ventricular hypertrophy. Indeterminate diastolic filling due to E-A fusion. Right Ventricle: The right ventricular size is normal. No increase in right ventricular wall thickness. Right ventricular systolic function is normal. Tricuspid regurgitation signal is inadequate for assessing PA pressure. Left Atrium: Left atrial size was moderately dilated. Right Atrium: Right atrial size was normal in size. Pericardium: There is no evidence of pericardial effusion. Mitral Valve: The mitral valve is normal in structure. Mild to moderate mitral annular calcification. Mild mitral valve regurgitation. No evidence of mitral valve  stenosis. Tricuspid Valve: The tricuspid valve is normal in structure. Tricuspid valve regurgitation is trivial. Aortic Valve: The aortic valve is tricuspid. Aortic valve regurgitation is not visualized. No aortic stenosis is present. Pulmonic Valve: The pulmonic valve was not well visualized. Pulmonic valve regurgitation is not visualized. No evidence of pulmonic stenosis. Aorta: The aortic root and ascending aorta are structurally normal, with no evidence of dilitation. Venous: The  inferior vena cava is dilated in size with greater than 50% respiratory variability, suggesting right atrial pressure of 8 mmHg. IAS/Shunts: No atrial level shunt detected by color flow Doppler. Additional Comments: A device lead is visualized in the right ventricle and right atrium.  LEFT VENTRICLE PLAX 2D LVIDd:         6.50 cm      Diastology LVIDs:         5.80 cm      LV e' medial:   4.57 cm/s LV PW:         0.90 cm      LV E/e' medial: 25.4 LV IVS:        1.10 cm LVOT diam:     2.30 cm LV SV:         79 LV SV Index:   35 LVOT Area:     4.15 cm  LV Volumes (MOD) LV vol d, MOD A2C: 270.0 ml LV vol d, MOD A4C: 207.0 ml LV vol s, MOD A2C: 177.0 ml LV vol s, MOD A4C: 131.0 ml LV SV MOD A2C:     93.0 ml LV SV MOD A4C:     207.0 ml LV SV MOD BP:      83.1 ml RIGHT VENTRICLE             IVC RV S prime:     16.00 cm/s  IVC diam: 2.40 cm TAPSE (M-mode): 2.9 cm LEFT ATRIUM             Index        RIGHT ATRIUM           Index LA diam:        4.10 cm 1.84 cm/m   RA Area:     17.90 cm LA Vol (A2C):   73.9 ml 33.10 ml/m  RA Volume:   48.60 ml  21.77 ml/m LA Vol (A4C):   66.5 ml 29.78 ml/m LA Biplane Vol: 71.9 ml 32.20 ml/m  AORTIC VALVE LVOT Vmax:   127.00 cm/s LVOT Vmean:  87.000 cm/s LVOT VTI:    0.189 m  AORTA Ao Root diam: 3.30 cm Ao Asc diam:  3.80 cm MITRAL VALVE MV Area (PHT): 4.41 cm     SHUNTS MV Decel Time: 172 msec     Systemic VTI:  0.19 m MV E velocity: 116.00 cm/s  Systemic Diam: 2.30 cm Mihai Croitoru MD Electronically  signed by Luana Rumple MD Signature Date/Time: 10/09/2023/4:01:55 PM    Final      Medications:     Scheduled Medications:  allopurinol   300 mg Oral Daily   budesonide   2 mL Nebulization BID   enoxaparin  (LOVENOX ) injection  50 mg Subcutaneous Q24H   oxybutynin   15 mg Oral q AM   pantoprazole   40 mg Oral Daily   rosuvastatin   5 mg Oral Daily   tamsulosin   0.4 mg Oral Daily    Infusions:   PRN Medications: acetaminophen , alum & mag hydroxide-simeth, artificial tears, cholestyramine , HYDROcodone -acetaminophen , melatonin, polyethylene glycol, prochlorperazine    Patient Profile   Mr.Budreau is a 73 year old male with history of nonischemic cardiomyopathy, systolic heart failure, complete heart block status post CRT-D, CKD, hypertension that presents today with fatigue and diarrhea.   Assessment/Plan  Acute on Chronic systolic heart failure: Patient with borderline ACC/AHA stage D cardiomyopathy with NYHA class IIIb symptoms. RHC 4/25 showed borderline index. He is not interested in LVAD at this time, suspect borderline candidate with his deconditioning. Amyloid  workup negative - s/p Barostim 10/04/23 - proBNP 17k, up from 6K in April. - Volume status difficult to assess but looks a little elevated. Dyspneic with minimal exertion. Given 40 mg IV lasix .  -Will need to consider RHC to further assess hemodynamics.  - GDMT -  Continue to hold metoprolol  and losartan  - would like to add spiro to assist with K replacement, however hypotensive - will touch base with research about COMET trial; as previously noted by Dr. Alease Amend   Anemia - h/o anemia of chronic disease. Now with concern for blood loss.  - hgb 12.9 on 08/09/23. Now down to 8.5 on admit -6/3 1UPRBC- Hgb 8>8.6. No obvious source.   - fecal occult ordered   CKD 3a - b/l 1.3-1.4 - Stable.    CHB: CRT-D in place, follows with EP   HTN: Pressure a little better today. Continue to hold GDMT.    HLD: continue crestor  5  mg daily   Diarrhea - has intermittently at home; on Questran   - could be worsened by new baro stimulator/.  - No diarrhea in 3 days    Diabetes:  - Currently on insulin , follows with PCP, last A1c 5.6   ID: recent intubation on 5/29. Started on treatment for cap with rochephin/doxy per primary. - Bcx- NGTD; procal 0.11 -WBC is not elevated. Afebrile.    Length of Stay: 1  Travin Marik, NP  10/10/2023, 8:00 AM  Advanced Heart Failure Team Pager [phone] (M-F; 7a - 5p)  Please contact [facility] for night-coverage after hours (5p -7a ) and weekends on [email or web address]

## 2023-10-10 NOTE — TOC Initial Note (Signed)
 Transition of Care [facility]) - Initial/Assessment Note    Patient Details  Name: William Villanueva MRN: [id number] Date of Birth: 12-Mar-1951  Transition of [facility]) CM/SW Contact:    Ernst Heap Phone Number: [phone] 10/10/2023, 9:55 AM  Clinical Narrative:  [facility] CSW met with patient at bedside. Patient stated that he lives with spouse at home. Patient stated that he has no history of HH services. Patients stated that he uses a cane. Patient stated that he drives and does not work. Patient stated that he has a scale at home. Patient has a PCP. CSW explained that a hospital follow up appointment is typically scheduled closer towards dc. Patient agrees. Wife will provide transportation home at dc.   TOC will continue following.                  Expected Discharge Plan: Home/Self Care     Patient Goals and CMS Choice            Expected Discharge Plan and Services       Living arrangements for the past 2 months: Single Family Home                                      Prior Living Arrangements/Services Living arrangements for the past 2 months: Single Family Home Lives with:: Spouse Patient language and need for interpreter reviewed:: Yes Do you feel safe going back to the place where you live?: Yes      Need for Family Participation in Patient Care: No (Comment) Care giver support system in place?: Yes (comment)   Criminal Activity/Legal Involvement Pertinent to Current Situation/Hospitalization: No - Comment as needed  Activities of Daily Living   ADL Screening (condition at time of admission) Independently performs ADLs?: Yes (appropriate for developmental age) Is the patient deaf or have difficulty hearing?: No Does the patient have difficulty seeing, even when wearing glasses/contacts?: No Does the patient have difficulty concentrating, remembering, or making decisions?: No  Permission Sought/Granted                  Emotional  Assessment Appearance:: Appears stated age Attitude/Demeanor/Rapport: Engaged Affect (typically observed): Appropriate Orientation: : Oriented to Self, Oriented to Place, Oriented to  Time, Oriented to Situation Alcohol  / Substance Use: Not Applicable Psych Involvement: No (comment)  Admission diagnosis:  Hypoxia [R09.02] Acute on chronic combined systolic (congestive) and diastolic (congestive) heart failure (HCC) [I50.43] Acute on chronic congestive heart failure, unspecified heart failure type (HCC) [I50.9] Patient Active Problem List   Diagnosis Date Noted   Diarrhea 10/09/2023   Polycythemia 10/09/2023   Osteoarthritis of carpometacarpal (CMC) joint of right thumb 01/25/2023   Acute on chronic combined systolic and diastolic CHF (congestive heart failure) (HCC) 01/11/2023   Acute on chronic combined systolic (congestive) and diastolic (congestive) heart failure (HCC) 01/10/2023   CHF (congestive heart failure) (HCC) 01/08/2023   Acute on chronic systolic CHF (congestive heart failure) (HCC) 12/21/2022   CKD stage 3a, GFR 45-59 ml/min (HCC) 12/21/2022   Chronic respiratory failure with hypoxia (HCC) - prn 2 L/min home O2 12/21/2022   Bilateral pleural effusion 12/21/2022   Increased frequency of urination 10/09/2022   Nevus of scalp 10/09/2022   Rheumatoid arthritis (HCC) 02/23/2022   High risk medication use 02/23/2022   COPD (chronic obstructive pulmonary disease) (HCC) 02/10/2022   Class 2 obesity 02/08/2022   DM2 (diabetes  mellitus, type 2) (HCC) 02/04/2022   Nocturnal hypoxemia 12/30/2021   Non-ischemic cardiomyopathy (HCC) 07/29/2020   Postural dizziness with presyncope 02/18/2020   Ulcerative colitis, acute, unspecified complication (HCC) 02/18/2020   Thrombocytosis 02/12/2020   Sprain of left wrist 08/12/2019   Depression 04/05/2016   Kidney stones 04/05/2016   Osteoarthritis 04/05/2016   OSA (obstructive sleep apnea) 04/05/2016   Bilateral carpal tunnel syndrome  12/02/2015   Chronic combined systolic and diastolic congestive heart failure (HCC) 04/13/2015   Orthostatic hypotension 01/19/2015   Obesity 11/26/2014   Enlarged prostate without lower urinary tract symptoms (luts) 10/29/2014   Essential hypertension 10/29/2014   GERD (gastroesophageal reflux disease) 10/29/2014   Panic attack 10/29/2014   Changing skin lesion 10/16/2014   Renal cyst 09/02/2014   Dyspnea on exertion 06/21/2014   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   Palpitations 06/11/2014   Myofascial pain 05/28/2014   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   Other cardiomyopathies (HCC) 04/29/2014   Chronic pain 02/12/2014   Lumbar radiculopathy 02/09/2014   Thrombocythemia 12/01/2013   Muscular wasting and disuse atrophy 08/15/2013   Degenerative lumbar spinal stenosis 08/06/2013   Lumbar and sacral osteoarthritis 05/19/2013   Congestive dilated cardiomyopathy (HCC) 01/11/2012   Back pain    Hemochromatosis    SOB (shortness of breath)    Gout    Polycythemia vera (HCC) 03/27/2011   PCP:  Rae Bugler, MD Pharmacy:   [id number] - [place], [place] - [address] [place] [address] Phone: [phone] Fax: [phone]  [facility] - [place], [place] - [address] [place] [address] Phone: [phone] Fax: [phone]  [facility]) - [place], [place] - [address] [facility] [address] [place] [address] [place] [address] Phone: [phone] Fax: [phone]  [facility] [place], [place] - [address] [place] [address] Phone: [phone] Fax: [phone]  [facility] - [place], [place] - [address] [place] [address] Phone: [phone] Fax: [phone]  [facility] [place] [facility] [address] [place] [address] Phone: [phone] Fax: [phone]  [place] [facility] [address] [place] [address] Phone: [phone] Fax: [phone]  [facility] [address] [place] [address] Phone: [phone] Fax: [phone]     Social Drivers of Health (SDOH) Social History: SDOH Screenings   Food Insecurity: No Food Insecurity (10/09/2023)  Housing: Low Risk  (10/09/2023)  Transportation Needs: No Transportation Needs (10/09/2023)  Utilities: Not At Risk (10/09/2023)  Alcohol  Screen: Low Risk  (02/06/2022)  Financial Resource Strain: Low Risk  (02/06/2022)  Social Connections: Moderately Integrated (10/09/2023)  Tobacco Use: Medium Risk (10/08/2023)   SDOH Interventions:     Readmission Risk Interventions     No data to display

## 2023-10-10 NOTE — Plan of Care (Signed)

## 2023-10-10 NOTE — Progress Notes (Signed)
 Physical Therapy Treatment Patient Details Name: William Villanueva MRN: [id number] DOB: [birth year] Today's Date: 10/10/2023   History of Present Illness Patient is 73 y.o. male presented to [facility] ED with SOB and diarrhea the night PTA; found to be hypoxic in 80's on RA, mildly volume overloaded and CXR revealed pulmonary edema. Pt recently s/p Barostim device implantation on 10/04/23 to treat his NYHA class III heart failure. 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 DM, gout, lumbar lami/decompression.    PT Comments  Pt mobilizes well at a min assist level, needing cues for better safety with the RW.  Sats continue to drop during gait without O2 to the 87/88% level with quick return to 90+% with rest.  Plan to push pt to mobilize further and more times starting tomorrow.     If plan is discharge home, recommend the following: A little help with walking and/or transfers;A little help with bathing/dressing/bathroom;Assistance with cooking/housework;Assist for transportation;Help with stairs or ramp for entrance   Can travel by private vehicle        Equipment Recommendations  Wheelchair (measurements PT);Wheelchair cushion (measurements PT)    Recommendations for Other Services       Precautions / Restrictions Precautions Precautions: Fall Recall of Precautions/Restrictions: Intact     Mobility  Bed Mobility               General bed mobility comments: up in the chair on arrival.    Transfers Overall transfer level: Needs assistance Equipment used: None Transfers: Sit to/from Stand Sit to Stand: Contact guard assist           General transfer comment: cues for hand placement/safety only    Ambulation/Gait Ambulation/Gait assistance: Min assist, Contact guard assist Gait Distance (Feet): 35 Feet Assistive device: Rolling walker (2 wheels) Gait Pattern/deviations: Step-through pattern, Decreased step length - right,  Decreased step length - left, Decreased stride length   Gait velocity interpretation: 1.31 - 2.62 ft/sec, indicative of limited community ambulator   General Gait Details: mildly unsteady overall.  Too fast gait speed given  lack of breath support.  Cues for safety and better proximity to the RW.  Sats dropped to around 87%, with quick return to low 90's without addition of O2   Stairs             Wheelchair Mobility     Tilt Bed    Modified Rankin (Stroke Patients Only)       Balance     Sitting balance-Leahy Scale: Good       Standing balance-Leahy Scale: Poor                              Communication Communication Communication: No apparent difficulties  Cognition Arousal: Alert Behavior During Therapy: WFL for tasks assessed/performed   PT - Cognitive impairments: No apparent impairments                         Following commands: Intact      Cueing Cueing Techniques: Verbal cues  Exercises      General Comments General comments (skin integrity, edema, etc.): VSS overall except during gait, SpO2 dropped to 87/88 %,  pt declined standing exercise today      Pertinent Vitals/Pain Pain Assessment Pain Assessment: Faces Faces Pain Scale: Hurts little more Pain Location: chronic back Pain Descriptors / Indicators: Discomfort Pain Intervention(s):  Monitored during session    Home Living                          Prior Function            PT Goals (current goals can now be found in the care plan section) Acute Rehab PT Goals PT Goal Formulation: With patient Time For Goal Achievement: 10/23/23 Potential to Achieve Goals: Good Progress towards PT goals: Progressing toward goals    Frequency    Min 2X/week      PT Plan      Co-evaluation              AM-PAC PT "6 Clicks" Mobility   Outcome Measure  Help needed turning from your back to your side while in a flat bed without using bedrails?: A  Little Help needed moving from lying on your back to sitting on the side of a flat bed without using bedrails?: A Little Help needed moving to and from a bed to a chair (including a wheelchair)?: A Little Help needed standing up from a chair using your arms (e.g., wheelchair or bedside chair)?: A Little Help needed to walk in hospital room?: A Little Help needed climbing 3-5 steps with a railing? : A Little 6 Click Score: 18    End of Session   Activity Tolerance: Patient tolerated treatment well Patient left: in bed;with call bell/phone within reach;with family/visitor present;Other (comment) Nurse Communication: Mobility status PT Visit Diagnosis: Muscle weakness (generalized) (M62.81);Difficulty in walking, not elsewhere classified (R26.2);Other abnormalities of gait and mobility (R26.89);Unsteadiness on feet (R26.81)     Time: 1720-1741 PT Time Calculation (min) (ACUTE ONLY): 21 min  Charges:    $Gait Training: 8-22 mins PT General Charges $$ ACUTE PT VISIT: 1 Visit                     10/10/2023  William Villanueva., PT Acute Rehabilitation Services [phone]  (office)   William Villanueva William Villanueva 10/10/2023, 6:05 PM

## 2023-10-11 ENCOUNTER — Inpatient Hospital Stay (HOSPITAL_COMMUNITY)

## 2023-10-11 DIAGNOSIS — I509 Heart failure, unspecified: Secondary | ICD-10-CM | POA: Diagnosis not present

## 2023-10-11 DIAGNOSIS — I5023 Acute on chronic systolic (congestive) heart failure: Secondary | ICD-10-CM | POA: Diagnosis not present

## 2023-10-11 DIAGNOSIS — D751 Secondary polycythemia: Secondary | ICD-10-CM | POA: Diagnosis not present

## 2023-10-11 LAB — COMPREHENSIVE METABOLIC PANEL WITH GFR
ALT: 9 U/L (ref 0–44)
AST: 10 U/L — ABNORMAL LOW (ref 15–41)
Albumin: 2.8 g/dL — ABNORMAL LOW (ref 3.5–5.0)
Alkaline Phosphatase: 53 U/L (ref 38–126)
Anion gap: 8 (ref 5–15)
BUN: 33 mg/dL — ABNORMAL HIGH (ref 8–23)
CO2: 19 mmol/L — ABNORMAL LOW (ref 22–32)
Calcium: 8.3 mg/dL — ABNORMAL LOW (ref 8.9–10.3)
Chloride: 106 mmol/L (ref 98–111)
Creatinine, Ser: 1.74 mg/dL — ABNORMAL HIGH (ref 0.61–1.24)
GFR, Estimated: 41 mL/min — ABNORMAL LOW (ref >60)
Glucose, Bld: 122 mg/dL — ABNORMAL HIGH (ref 70–99)
Potassium: 4.3 mmol/L (ref 3.5–5.1)
Sodium: 133 mmol/L — ABNORMAL LOW (ref 135–145)
Total Bilirubin: 0.8 mg/dL (ref 0.0–1.2)
Total Protein: 6.2 g/dL — ABNORMAL LOW (ref 6.5–8.1)

## 2023-10-11 LAB — CBC WITH DIFFERENTIAL/PLATELET
Abs Immature Granulocytes: 0.09 10*3/uL — ABNORMAL HIGH (ref 0.00–0.07)
Basophils Absolute: 0 10*3/uL (ref 0.0–0.1)
Basophils Relative: 1 %
Eosinophils Absolute: 0 10*3/uL (ref 0.0–0.5)
Eosinophils Relative: 1 %
HCT: 25 % — ABNORMAL LOW (ref 39.0–52.0)
Hemoglobin: 8.1 g/dL — ABNORMAL LOW (ref 13.0–17.0)
Immature Granulocytes: 2 %
Lymphocytes Relative: 7 %
Lymphs Abs: 0.4 10*3/uL — ABNORMAL LOW (ref 0.7–4.0)
MCH: 32.7 pg (ref 26.0–34.0)
MCHC: 32.4 g/dL (ref 30.0–36.0)
MCV: 100.8 fL — ABNORMAL HIGH (ref 80.0–100.0)
Monocytes Absolute: 0.6 10*3/uL (ref 0.1–1.0)
Monocytes Relative: 11 %
Neutro Abs: 4.6 10*3/uL (ref 1.7–7.7)
Neutrophils Relative %: 78 %
Platelets: 316 10*3/uL (ref 150–400)
RBC: 2.48 MIL/uL — ABNORMAL LOW (ref 4.22–5.81)
RDW: 18.5 % — ABNORMAL HIGH (ref 11.5–15.5)
WBC: 5.8 10*3/uL (ref 4.0–10.5)
nRBC: 0 % (ref 0.0–0.2)

## 2023-10-11 LAB — RETICULOCYTES
Immature Retic Fract: 7.3 % (ref 2.3–15.9)
RBC.: 2.48 MIL/uL — ABNORMAL LOW (ref 4.22–5.81)
Retic Count, Absolute: 10.2 10*3/uL — ABNORMAL LOW (ref 19.0–186.0)
Retic Ct Pct: 0.4 % (ref 0.4–3.1)

## 2023-10-11 LAB — PREPARE RBC (CROSSMATCH)

## 2023-10-11 MED ORDER — FUROSEMIDE 10 MG/ML IJ SOLN
40.0000 mg | Freq: Once | INTRAMUSCULAR | Status: DC
  Filled 2023-10-11: qty 4

## 2023-10-11 MED ORDER — EPOETIN ALFA 40000 UNIT/ML IJ SOLN
40000.0000 [IU] | Freq: Every day | INTRAMUSCULAR | Status: DC
  Administered 2023-10-11 – 2023-10-12 (×2): 40000 [IU] via SUBCUTANEOUS
  Filled 2023-10-11 (×2): qty 1

## 2023-10-11 MED ORDER — SPIRONOLACTONE 12.5 MG HALF TABLET
12.5000 mg | ORAL_TABLET | Freq: Every day | ORAL | Status: DC
  Administered 2023-10-11 – 2023-10-12 (×2): 12.5 mg via ORAL
  Filled 2023-10-11 (×2): qty 1

## 2023-10-11 MED ORDER — FUROSEMIDE 10 MG/ML IJ SOLN
40.0000 mg | Freq: Once | INTRAMUSCULAR | Status: AC
  Administered 2023-10-11: 40 mg via INTRAVENOUS
  Filled 2023-10-11: qty 4

## 2023-10-11 MED ORDER — SODIUM CHLORIDE 0.9% IV SOLUTION: Freq: Once | INTRAVENOUS | Status: AC

## 2023-10-11 MED ORDER — FUROSEMIDE 10 MG/ML IJ SOLN: 40.0000 mg | Freq: Once | INTRAMUSCULAR | Status: DC

## 2023-10-11 NOTE — Progress Notes (Signed)
 Advanced Heart Failure Rounding Note  Cardiologist: Alexandria Angel, MD  Chief Complaint: Heart Failure  Subjective:   6/3: 1UPRBC. Diuresed with IV lasix . Sluggish diuresis noted.  6/5: 2 u RBCs.  Hgb down to 8.1, 2u RBCs ordered, will time IV Lasix  40 mg x1  Feeling a little bit better, still having some shortness of breath, but feels that it is close to normal. Fatigued. No diarrhea.  Objective:    Weight Range: 105.4 kg Body mass index is 33.34 kg/m.   Vital Signs:   Temp:  [97.7 F (36.5 C)-100 F (37.8 C)] 98.2 F (36.8 C) (06/05 0418) Pulse Rate:  [96-113] 111 (06/05 0418) Resp:  [15-20] 18 (06/05 0418) BP: (91-116)/(41-48) 116/43 (06/05 0418) SpO2:  [92 %-99 %] 92 % (06/05 0418) Weight:  [105.4 kg] 105.4 kg (06/05 0418) Last BM Date : 10/09/23  Weight change: Filed Weights   10/09/23 0528 10/10/23 0500 10/11/23 0418  Weight: 106.3 kg 106.3 kg 105.4 kg   Intake/Output:  Intake/Output Summary (Last 24 hours) at 10/11/2023 0738 Last data filed at 10/11/2023 0300 Gross per 24 hour  Intake 180 ml  Output 900 ml  Net -720 ml    Physical Exam   General: Weak, pale appearing. SOB with conversation Cardiac: JVP difficult to assess. S1 and S2 present. No murmurs or rub. Resp: Lung sounds clear in all quadrants Extremities: Warm and dry.  No peripheral edema.  Neuro: Alert and oriented x3. Affect pleasant. Moves all extremities without difficulty.  Telemetry   ST 100s with PVCs (personally reviewed)  EKG    N/A  Labs    CBC Recent Labs    10/10/23 0232 10/11/23 0228  WBC 6.3 5.8  NEUTROABS  --  4.6  HGB 8.6* 8.1*  HCT 26.6* 25.0*  MCV 102.3* 100.8*  PLT 280 316   Basic Metabolic Panel Recent Labs    96/29/52 0258 10/10/23 0232 10/11/23 0228  NA 139 134* 133*  K 3.4* 4.5 4.3  CL 109 104 106  CO2 19* 19* 19*  GLUCOSE 131* 117* 122*  BUN 25* 27* 33*  CREATININE 1.17 1.41* 1.74*  CALCIUM  8.2* 8.5* 8.3*  MG 1.4* 1.9  --   PHOS 3.3  --    --    Liver Function Tests Recent Labs    10/08/23 1420 10/11/23 0228  AST 17 10*  ALT 11 9  ALKPHOS 82 53  BILITOT 0.6 0.8  PROT 6.7 6.2*  ALBUMIN  3.8 2.8*   BNP (last 3 results) Recent Labs    12/21/22 1922 01/08/23 1801 07/18/23 1244  BNP 889.4* 1,183.7* 612.8*   ProBNP (last 3 results) Recent Labs    08/28/23 0909 10/08/23 1420  PROBNP 6,803.0* 17,141.0*   D-Dimer Recent Labs    10/09/23 0813  DDIMER 2.52*   Imaging   No results found.  Medications:    Scheduled Medications:  sodium chloride    Intravenous Once   allopurinol   300 mg Oral Daily   budesonide   2 mL Nebulization BID   enoxaparin  (LOVENOX ) injection  50 mg Subcutaneous Q24H   epoetin alfa  40,000 Units Subcutaneous Daily   furosemide   40 mg Intravenous Once   furosemide   40 mg Intravenous Once   oxybutynin   15 mg Oral q AM   pantoprazole   40 mg Oral Daily   rosuvastatin   5 mg Oral Daily   tamsulosin   0.4 mg Oral Daily   Infusions:  PRN Medications: acetaminophen , alum & mag hydroxide-simeth, artificial tears,  cholestyramine , HYDROcodone -acetaminophen , melatonin, polyethylene glycol, prochlorperazine  Patient Profile   Mr.William Villanueva is a 73 year old male with history of nonischemic cardiomyopathy, systolic heart failure, complete heart block status post CRT-D, CKD, hypertension that presents today with fatigue and diarrhea.   Assessment/Plan   Acute on Chronic systolic heart failure: Patient with borderline ACC/AHA stage D cardiomyopathy with NYHA class IIIb symptoms. RHC 4/25 showed borderline index. He is not interested in LVAD at this time, suspect borderline candidate with his deconditioning. Amyloid workup negative - s/p Barostim 10/04/23 - proBNP 17k, up from 6K in April. - NYHA IIIb-IV. Dyspneic with minimal exertion. Volume status difficult, suspect dry given creatinine rise and need for blood. Will give Lasix  40 mg IV with RBCs. Check ReDs this morning.  - Will need to consider  RHC to further assess hemodynamics. - Hold Toprol  and losartan  with soft BP - restart spiro 12.5 mg daily - will touch base with research about COMET trial; as previously noted by Dr. Alease Amend   Anemia - h/o anemia of chronic disease. Now with concern for blood loss.  - hgb 12.9 on 08/09/23. Now down to 8.5 on admit - 6/3 1u PRBC- Hgb 8>8.6. No obvious source.   - seen by Dr. Maria Shiner, oncology > 2 u RBCs + ESA ordered   AKI on CKD 3a - b/l 1.3-1.4 - Cr bumped to 1.7 this morning. Suspect will improve with RBCs - give IV Lasix  40 x1 with 2u   CHB: CRT-D in place, follows with EP   HTN: Pressure a little better today. Continue to hold GDMT.    HLD: continue crestor  5 mg daily   Diarrhea - has intermittently at home; continue Questran   - could be worsened by new baro stimulator. - No diarrhea in 3 days    Diabetes: On SSI, follows with PCP. Last A1c 5.6   ID: recent intubation on 5/29. - Bcx- NGTD; procal 0.11 - WBC is not elevated. Afebrile.  - no concern for infx at this time  Length of Stay: 2  [place] Denesia Donelan, NP  10/11/2023, 7:38 AM  Advanced Heart Failure Team Pager [phone] (M-F; 7a - 5p)  Please contact [facility] for night-coverage after hours (5p -7a ) and weekends on [email or web address]

## 2023-10-11 NOTE — Plan of Care (Signed)

## 2023-10-11 NOTE — Consult Note (Signed)
 William Villanueva is well-known to me.  He is a nice 73 year old white male.  I have known him for many years.  He has a history of polycythemia.  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  However, for the past several years, he has not needed any phlebotomies.  In fact, we actually have given him iron in the past because of anemia.  His main problem has been congestive heart failure.  He had a procedure done a week ago.  He had a Barostim placed.  William Villanueva said after this, he had horrible diarrhea.  He subsequently went into heart failure.  He had echocardiogram which showed a left ventricular ejection fraction of 20-25%.  He was admitted on 10/08/2023.  When he came in, his sodium was 140.  Potassium 3.9.  BUN 24 creatinine 0.94.  Calcium  8.4.  LFTs were normal.  His white cell count was 6.5.  Hemoglobin 8.5.  Platelet count 260,000.  His MCV was 103.  I think he has got 1 unit of blood.  Today, his white cell count is 5.8.  Hemoglobin 8.1.  Platelet count 316,000.  His corrected reticulocyte count is pretty much 0.  He did have an erythropoietin  level done several years ago.  This was incredibly low at 6.4.  As far as he knows, he has had no problem melena or bright red blood per rectum.  I think stools are being checked for blood.  He has had iron studies done.  On 10/09/2023.  His ferritin was 854 with an iron saturation of 31%.  The 1 thing I do worry about is given his long standing polycythemia, that he might be developing a "burnt out" phase.  I think the only way that we will know this is with a bone marrow biopsy.  I probably would not do this right now given his other issues.  He clearly needs to be transfused.  I think he also will benefit from ESA.  Again his main problem clearly is the congestive heart failure.  He has a very low ejection fraction.  He has had no fever.  As far as he knows, no one else in the family has had problems with diarrhea.  He says  since he has been in the hospital, he said no diarrhea.  He had no bowel movements.  He also has been bothered by severe lower back pain.  I think has had back surgery.  I think has had injections.  On his physical exam, his temperature is 98.2.  Pulse 111.  Blood pressure 116/43.  His head and exam shows no ocular or oral lesions.  He has no adenopathy in the neck.  There is no thrush in the oral cavity.  Lungs are clear bilaterally.  He has decent air movement bilaterally.  Cardiac exam is regular rate and rhythm.  He has no murmurs.  Abdomen is obese but soft.  He has decent bowel sounds.  There is no fluid wave.  There is no palpable hepatosplenomegaly.  Extremities show some chronic mild edema in the legs.  Skin exam shows scattered ecchymoses.  Neurological exam is nonfocal.   Clearly, William Villanueva needs to be transfused.  Again, I worry that the bone marrow might be becoming more fibrotic.  Again I suspect we are probably going to have to do a bone marrow biopsy on him.  However, this can be done later.  We will go ahead and transfuse him.  I think he needs 2 units  of blood.  We will also give him ESA.  I think he would benefit from this.  Hopefully, he will be able to go home for the weekend.  I hate that he was admitted.  Hopefully, the Barostim will improve his cardiac status.  We will follow him along.   Charmayne Cooper, MD  1 Chronicles 4:9-10

## 2023-10-11 NOTE — Progress Notes (Signed)
 PT Cancellation Note  Patient Details Name: William Villanueva MRN: [id number] DOB: [birth year]   Cancelled Treatment:    Reason Eval/Treat Not Completed: Medical issues which prohibited therapy (Pt's Hgb down to 8.1. 2 units of RBCs ordered. Per RN, pt lost his IV, awaiting a new one before transfusion can start. Will follow-up for PT treatment.)  Glenford Lanes, PT, DPT Acute Rehabilitation Services Office: [phone] Secure Chat Preferred  William Villanueva 10/11/2023, 9:50 AM

## 2023-10-11 NOTE — Progress Notes (Signed)
 PROGRESS NOTE    William Villanueva  ZOX:[id number] DOB: 09-19-1950 DOA: 10/08/2023 PCP: Rae Bugler, MD  73/M with chronic systolic CHF, NICM, CHB with CRT-D, CKD 3, hypertension presented with nausea vomiting and diarrhea. - Recently underwent right sided Barostim device placement 5/29 -In the ED, creatinine 0.9, BUN 24, BNP 17141, troponin 100, 96, hemoglobin 8.5, flu COVID/RSV negative, chest x-ray with pulmonary vascular congestion bilateral central interstitial infiltrates - Echo noted EF 20-25%, with regional wall motion abnormality, normal RV, mild MR  Subjective: Feels okay today, denies any further diarrhea or vomiting  Assessment and Plan:  Diarrhea -Developed nausea and diarrhea 24 hours after Barostim, either secondary to excess stimulation or antibiotics administered for procedure  -GI symptoms appear to have resolved  - Supportive care - Increase activity, PT OT  Acute on chronic systolic CHF (congestive heart failure) (HCC) -Echo this admission with EF 20-25%, normal RV - Volume status difficult to assess -Advanced heart failure team following, losartan  and metoprolol  on hold - Plan for Lasix  with PRBC today, restarting Aldactone  -GDMT limited by soft BP - failure to thrive with significant disease burden, will consult palliative care  CKD stage 3a, GFR 45-59 ml/min (HCC) Hypokalemia - Mild uptrend, monitor  Acute on chronic anemia - Hemoglobin dropped from 12 range few months ago to 8.5 now - Transfused 1 unit of PRBC on admission, hemoglobin in the 8 range, he denies overt blood loss - Anemia panel suggestive of chronic disease - History of PVera, seen by Dr. Maria Shiner this morning, appreciate input, concern for evolution to myelofibrosis, recommended bone marrow biopsy as outpatient, transfusing 2 more units of PRBC today  COPD (chronic obstructive pulmonary disease) (HCC) No signs of exacerbation   Polycythemia Vera -on ruxolitinib . Hemoglobin has been  trending down this year, see discussion above  Essential hypertension Hypovolemia on admission due to diarrhea   DM2 (diabetes mellitus, type 2) (HCC) Stable, SSI  GERD (gastroesophageal reflux disease) Continue proton pump inhibitors   Obesity Calculated BMI is 33.6 consistent with obesity class 1  DVT prophylaxis: lovenox  Code Status: Full Code Family Communication: Wife at bedside Disposition Plan: Home tomorrow stable  Consultants: CHF team   Procedures:   Antimicrobials:    Objective: Vitals:   10/11/23 0836 10/11/23 1000 10/11/23 1129 10/11/23 1206  BP:   (!) 95/59 (!) 91/51  Pulse:   97   Resp:  13 (!) 21 18  Temp:   97.7 F (36.5 C) 97.7 F (36.5 C)  TempSrc:   Oral Oral  SpO2: 91%  95%   Weight:      Height:        Intake/Output Summary (Last 24 hours) at 10/11/2023 1236 Last data filed at 10/11/2023 0836 Gross per 24 hour  Intake 416 ml  Output 900 ml  Net -484 ml   Filed Weights   10/09/23 0528 10/10/23 0500 10/11/23 0418  Weight: 106.3 kg 106.3 kg 105.4 kg    Examination:  General exam: Appears calm and comfortable chronically ill HEENT: No JVD Respiratory system: Few scattered rhonchi Cardiovascular system: S1 & S2 heard, RRR.  Abd: nondistended, soft and nontender.Normal bowel sounds heard. Central nervous system: Alert and oriented. No focal neurological deficits. Extremities: no edema Skin: No rashes Psychiatry: Flat affect    Data Reviewed:   CBC: Recent Labs  Lab 10/08/23 1420 10/09/23 0258 10/09/23 0646 10/09/23 1401 10/10/23 0232 10/11/23 0228  WBC 6.5 6.3  --   --  6.3 5.8  NEUTROABS  --   --   --   --   --  4.6  HGB 8.5* 8.0* 7.9* 8.1* 8.6* 8.1*  HCT 26.4* 25.5* 24.6* 25.3* 26.6* 25.0*  MCV 103.1* 104.9*  --   --  102.3* 100.8*  PLT 260 281  --   --  280 316   Basic Metabolic Panel: Recent Labs  Lab 10/08/23 1420 10/09/23 0258 10/10/23 0232 10/11/23 0228  NA 140 139 134* 133*  K 3.9 3.4* 4.5 4.3  CL 107  109 104 106  CO2 19* 19* 19* 19*  GLUCOSE 113* 131* 117* 122*  BUN 24* 25* 27* 33*  CREATININE 0.94 1.17 1.41* 1.74*  CALCIUM  8.4* 8.2* 8.5* 8.3*  MG  --  1.4* 1.9  --   PHOS  --  3.3  --   --    GFR: Estimated Creatinine Clearance: 46.7 mL/min (A) (by C-G formula based on SCr of 1.74 mg/dL (H)). Liver Function Tests: Recent Labs  Lab 10/08/23 1420 10/11/23 0228  AST 17 10*  ALT 11 9  ALKPHOS 82 53  BILITOT 0.6 0.8  PROT 6.7 6.2*  ALBUMIN  3.8 2.8*   No results for input(s): "LIPASE", "AMYLASE" in the last 168 hours. No results for input(s): "AMMONIA" in the last 168 hours. Coagulation Profile: No results for input(s): "INR", "PROTIME" in the last 168 hours. Cardiac Enzymes: No results for input(s): "CKTOTAL", "CKMB", "CKMBINDEX", "TROPONINI" in the last 168 hours. BNP (last 3 results) Recent Labs    08/28/23 0909 10/08/23 1420  PROBNP 6,803.0* 17,141.0*   HbA1C: No results for input(s): "HGBA1C" in the last 72 hours. CBG: Recent Labs  Lab 10/10/23 2008  GLUCAP 184*   Lipid Profile: No results for input(s): "CHOL", "HDL", "LDLCALC", "TRIG", "CHOLHDL", "LDLDIRECT" in the last 72 hours. Thyroid  Function Tests: No results for input(s): "TSH", "T4TOTAL", "FREET4", "T3FREE", "THYROIDAB" in the last 72 hours. Anemia Panel: Recent Labs    10/09/23 0424 10/11/23 0228  FERRITIN 854*  --   TIBC 231*  --   IRON 71  --   RETICCTPCT  --  0.4   Urine analysis:    Component Value Date/Time   COLORURINE YELLOW 09/20/2023 1343   APPEARANCEUR CLEAR 09/20/2023 1343   LABSPEC 1.012 09/20/2023 1343   LABSPEC 1.020 10/08/2015 0903   PHURINE 5.0 09/20/2023 1343   GLUCOSEU NEGATIVE 09/20/2023 1343   HGBUR NEGATIVE 09/20/2023 1343   BILIRUBINUR NEGATIVE 09/20/2023 1343   KETONESUR NEGATIVE 09/20/2023 1343   PROTEINUR NEGATIVE 09/20/2023 1343   UROBILINOGEN 0.2 10/08/2015 0903   NITRITE NEGATIVE 09/20/2023 1343   LEUKOCYTESUR NEGATIVE 09/20/2023 1343   Sepsis  Labs: @LABRCNTIP (procalcitonin:4,lacticidven:4)  ) Recent Results (from the past 240 hours)  Resp panel by RT-PCR (RSV, Flu A&B, Covid) Anterior Nasal Swab     Status: None   Collection Time: 10/08/23  2:20 PM   Specimen: Anterior Nasal Swab  Result Value Ref Range Status   SARS Coronavirus 2 by RT PCR NEGATIVE NEGATIVE Final    Comment: (NOTE) SARS-CoV-2 target nucleic acids are NOT DETECTED.  The SARS-CoV-2 RNA is generally detectable in upper respiratory specimens during the acute phase of infection. The lowest concentration of SARS-CoV-2 viral copies this assay can detect is 138 copies/mL. A negative result does not preclude SARS-Cov-2 infection and should not be used as the sole basis for treatment or other patient management decisions. A negative result may occur with  improper specimen collection/handling, submission of specimen other than nasopharyngeal swab, presence of viral mutation(s) within the areas targeted by this assay, and inadequate number of viral copies(<138 copies/mL). A  negative result must be combined with clinical observations, patient history, and epidemiological information. The expected result is Negative.  Fact Sheet for Patients:  BloggerCourse.com  Fact Sheet for Healthcare Providers:  SeriousBroker.it  This test is no t yet approved or cleared by the [place]  FDA and  has been authorized for detection and/or diagnosis of SARS-CoV-2 by FDA under an Emergency Use Authorization (EUA). This EUA will remain  in effect (meaning this test can be used) for the duration of the COVID-19 declaration under Section 564(b)(1) of the Act, 21 U.S.C.section 360bbb-3(b)(1), unless the authorization is terminated  or revoked sooner.       Influenza A by PCR NEGATIVE NEGATIVE Final   Influenza B by PCR NEGATIVE NEGATIVE Final    Comment: (NOTE) The Xpert Xpress SARS-CoV-2/FLU/RSV plus assay is intended as  an aid in the diagnosis of influenza from Nasopharyngeal swab specimens and should not be used as a sole basis for treatment. Nasal washings and aspirates are unacceptable for Xpert Xpress SARS-CoV-2/FLU/RSV testing.  Fact Sheet for Patients: BloggerCourse.com  Fact Sheet for Healthcare Providers: SeriousBroker.it  This test is not yet approved or cleared by the [place]  FDA and has been authorized for detection and/or diagnosis of SARS-CoV-2 by FDA under an Emergency Use Authorization (EUA). This EUA will remain in effect (meaning this test can be used) for the duration of the COVID-19 declaration under Section 564(b)(1) of the Act, 21 U.S.C. section 360bbb-3(b)(1), unless the authorization is terminated or revoked.     Resp Syncytial Virus by PCR NEGATIVE NEGATIVE Final    Comment: (NOTE) Fact Sheet for Patients: BloggerCourse.com  Fact Sheet for Healthcare Providers: SeriousBroker.it  This test is not yet approved or cleared by the [place]  FDA and has been authorized for detection and/or diagnosis of SARS-CoV-2 by FDA under an Emergency Use Authorization (EUA). This EUA will remain in effect (meaning this test can be used) for the duration of the COVID-19 declaration under Section 564(b)(1) of the Act, 21 U.S.C. section 360bbb-3(b)(1), unless the authorization is terminated or revoked.  Performed at [facility], [address] [place], [place] [address]   Blood culture (routine x 2)     Status: None (Preliminary result)   Collection Time: 10/08/23  6:14 PM   Specimen: BLOOD  Result Value Ref Range Status   Specimen Description   Final    BLOOD RIGHT ANTECUBITAL Performed at [facility], [address] [place], [place] [address]    Special Requests   Final    BOTTLES DRAWN AEROBIC AND ANAEROBIC Blood Culture results may not be  optimal due to an excessive volume of blood received in culture bottles Performed at [facility], [address] [place], [place] [address]    Culture   Final    NO GROWTH 3 DAYS Performed at [facility], [address] [place], [place] [address]    Report Status PENDING  Incomplete  Blood culture (routine x 2)     Status: None (Preliminary result)   Collection Time: 10/09/23  2:56 AM   Specimen: BLOOD RIGHT HAND  Result Value Ref Range Status   Specimen Description BLOOD RIGHT HAND  Final   Special Requests   Final    BOTTLES DRAWN AEROBIC AND ANAEROBIC Blood Culture adequate volume   Culture   Final    NO GROWTH 2 DAYS Performed at [facility], [address] [place], [place] [address]  Report Status PENDING  Incomplete     Radiology Studies: US  Abdomen Limited Result Date: 10/11/2023 CLINICAL DATA:  [id number] Polycythemia [id number] EXAM: ULTRASOUND ABDOMEN LIMITED COMPARISON:  June 03, 2022 FINDINGS: Enlarged spleen, measuring 15.4 x 6.4 x 5.6 cm (280 mL). No visualized mass. Normal echogenicity. 2.7 cm cyst in the partially visualized left kidney. No ascites. IMPRESSION: Moderate splenomegaly, unchanged by my measurement. No splenic mass. Electronically Signed   By: Rance Burrows M.D.   On: 10/11/2023 11:54   DG CHEST PORT 1 VIEW Result Date: 10/10/2023 CLINICAL DATA:  Heart failure EXAM: PORTABLE CHEST 1 VIEW COMPARISON:  October 08, 2023 FINDINGS: Heart normal size. Bilateral pulmonary interstitial infiltrates correlate with congestive changes without significant pleural effusions No consolidations No change in the left subclavian pacemaker device No change in the right hemithorax stimulator pacer device with the electrode traveling to the lower neck. IMPRESSION: Mild congestive changes.  Mild bilateral congestive changes Electronically Signed   By: Fredrich Jefferson M.D.   On: 10/10/2023 09:35   ECHOCARDIOGRAM COMPLETE Result Date: 10/09/2023    ECHOCARDIOGRAM  REPORT   Patient Name:   William Villanueva Date of Exam: 10/09/2023 Medical Rec #:  [id number]          Height:       70.0 in Accession #:    [id number]         Weight:       234.3 lb Date of Birth:  [birth year]           BSA:          2.233 m Patient Age:    73 years           BP:           107/47 mmHg Patient Gender: M                  HR:           105 bpm. Exam Location:  Inpatient Procedure: 2D Echo, Cardiac Doppler, Color Doppler and Intracardiac            Opacification Agent (Both Spectral and Color Flow Doppler were            utilized during procedure). Indications:    CHF  History:        Patient has prior history of Echocardiogram examinations, most                 recent 12/22/2022. Risk Factors:Hypertension.  Sonographer:    Janette Medley Referring Phys: [id number] CAROLE N HALL IMPRESSIONS  1. Left ventricular ejection fraction, by estimation, is 20 to 25%. The left ventricle has severely decreased function. The left ventricle demonstrates regional wall motion abnormalities (see scoring diagram/findings for description). The left ventricular internal cavity size was moderately dilated. Indeterminate diastolic filling due to E-A fusion.  2. Right ventricular systolic function is normal. The right ventricular size is normal. Tricuspid regurgitation signal is inadequate for assessing PA pressure.  3. Left atrial size was moderately dilated.  4. The mitral valve is normal in structure. Mild mitral valve regurgitation. No evidence of mitral stenosis.  5. The aortic valve is tricuspid. Aortic valve regurgitation is not visualized. No aortic stenosis is present.  6. The inferior vena cava is dilated in size with >50% respiratory variability, suggesting right atrial pressure of 8 mmHg. Comparison(s): No significant change from prior study. Prior images reviewed side by side. FINDINGS  Left Ventricle: There is no left ventricular thrombus (Definity   contrast was used). There is global hypokinesis, but the inferior and  inferolateral walss appear akinetic, consistent with infarction/scar in the right cooronary artery distribution. Left ventricular ejection fraction, by estimation, is 20 to 25%. The left ventricle has severely decreased function. The left ventricle demonstrates regional wall motion abnormalities. The left ventricular internal cavity size was moderately dilated. There is  no left ventricular hypertrophy. Indeterminate diastolic filling due to E-A fusion. Right Ventricle: The right ventricular size is normal. No increase in right ventricular wall thickness. Right ventricular systolic function is normal. Tricuspid regurgitation signal is inadequate for assessing PA pressure. Left Atrium: Left atrial size was moderately dilated. Right Atrium: Right atrial size was normal in size. Pericardium: There is no evidence of pericardial effusion. Mitral Valve: The mitral valve is normal in structure. Mild to moderate mitral annular calcification. Mild mitral valve regurgitation. No evidence of mitral valve stenosis. Tricuspid Valve: The tricuspid valve is normal in structure. Tricuspid valve regurgitation is trivial. Aortic Valve: The aortic valve is tricuspid. Aortic valve regurgitation is not visualized. No aortic stenosis is present. Pulmonic Valve: The pulmonic valve was not well visualized. Pulmonic valve regurgitation is not visualized. No evidence of pulmonic stenosis. Aorta: The aortic root and ascending aorta are structurally normal, with no evidence of dilitation. Venous: The inferior vena cava is dilated in size with greater than 50% respiratory variability, suggesting right atrial pressure of 8 mmHg. IAS/Shunts: No atrial level shunt detected by color flow Doppler. Additional Comments: A device lead is visualized in the right ventricle and right atrium.  LEFT VENTRICLE PLAX 2D LVIDd:         6.50 cm      Diastology LVIDs:         5.80 cm      LV e' medial:   4.57 cm/s LV PW:         0.90 cm      LV E/e' medial: 25.4  LV IVS:        1.10 cm LVOT diam:     2.30 cm LV SV:         79 LV SV Index:   35 LVOT Area:     4.15 cm  LV Volumes (MOD) LV vol d, MOD A2C: 270.0 ml LV vol d, MOD A4C: 207.0 ml LV vol s, MOD A2C: 177.0 ml LV vol s, MOD A4C: 131.0 ml LV SV MOD A2C:     93.0 ml LV SV MOD A4C:     207.0 ml LV SV MOD BP:      83.1 ml RIGHT VENTRICLE             IVC RV S prime:     16.00 cm/s  IVC diam: 2.40 cm TAPSE (M-mode): 2.9 cm LEFT ATRIUM             Index        RIGHT ATRIUM           Index LA diam:        4.10 cm 1.84 cm/m   RA Area:     17.90 cm LA Vol (A2C):   73.9 ml 33.10 ml/m  RA Volume:   48.60 ml  21.77 ml/m LA Vol (A4C):   66.5 ml 29.78 ml/m LA Biplane Vol: 71.9 ml 32.20 ml/m  AORTIC VALVE LVOT Vmax:   127.00 cm/s LVOT Vmean:  87.000 cm/s LVOT VTI:    0.189 m  AORTA Ao Root diam: 3.30 cm Ao Asc diam:  3.80 cm  MITRAL VALVE MV Area (PHT): 4.41 cm     SHUNTS MV Decel Time: 172 msec     Systemic VTI:  0.19 m MV E velocity: 116.00 cm/s  Systemic Diam: 2.30 cm Mihai Croitoru MD Electronically signed by Luana Rumple MD Signature Date/Time: 10/09/2023/4:01:55 PM    Final      Scheduled Meds:  allopurinol   300 mg Oral Daily   budesonide   2 mL Nebulization BID   enoxaparin  (LOVENOX ) injection  50 mg Subcutaneous Q24H   epoetin alfa  40,000 Units Subcutaneous Daily   oxybutynin   15 mg Oral q AM   pantoprazole   40 mg Oral Daily   rosuvastatin   5 mg Oral Daily   spironolactone   12.5 mg Oral Daily   tamsulosin   0.4 mg Oral Daily   Continuous Infusions:   LOS: 2 days    Time spent:    Deforest Fast, MD [facility]   10/11/2023, 12:36 PM

## 2023-10-11 NOTE — Progress Notes (Signed)
 REDS Clip  READING (normal 20-35%) = 38 %  CHEST RULER (in) =34 Clip Station =  D  Results given to [place] Lee, NP.   Randie Bustle, BSN, Scientist, clinical (histocompatibility and immunogenetics) Only

## 2023-10-12 ENCOUNTER — Other Ambulatory Visit (HOSPITAL_COMMUNITY): Payer: Self-pay

## 2023-10-12 DIAGNOSIS — I5023 Acute on chronic systolic (congestive) heart failure: Secondary | ICD-10-CM | POA: Diagnosis not present

## 2023-10-12 DIAGNOSIS — D751 Secondary polycythemia: Secondary | ICD-10-CM | POA: Diagnosis not present

## 2023-10-12 DIAGNOSIS — I509 Heart failure, unspecified: Secondary | ICD-10-CM | POA: Diagnosis not present

## 2023-10-12 LAB — CBC WITH DIFFERENTIAL/PLATELET
Abs Immature Granulocytes: 0.09 10*3/uL — ABNORMAL HIGH (ref 0.00–0.07)
Basophils Absolute: 0 10*3/uL (ref 0.0–0.1)
Basophils Relative: 1 %
Eosinophils Absolute: 0 10*3/uL (ref 0.0–0.5)
Eosinophils Relative: 1 %
HCT: 30 % — ABNORMAL LOW (ref 39.0–52.0)
Hemoglobin: 10 g/dL — ABNORMAL LOW (ref 13.0–17.0)
Immature Granulocytes: 1 %
Lymphocytes Relative: 7 %
Lymphs Abs: 0.4 10*3/uL — ABNORMAL LOW (ref 0.7–4.0)
MCH: 31.9 pg (ref 26.0–34.0)
MCHC: 33.3 g/dL (ref 30.0–36.0)
MCV: 95.8 fL (ref 80.0–100.0)
Monocytes Absolute: 0.8 10*3/uL (ref 0.1–1.0)
Monocytes Relative: 12 %
Neutro Abs: 5.2 10*3/uL (ref 1.7–7.7)
Neutrophils Relative %: 78 %
Platelets: 326 10*3/uL (ref 150–400)
RBC: 3.13 MIL/uL — ABNORMAL LOW (ref 4.22–5.81)
RDW: 21.1 % — ABNORMAL HIGH (ref 11.5–15.5)
WBC: 6.6 10*3/uL (ref 4.0–10.5)
nRBC: 0 % (ref 0.0–0.2)

## 2023-10-12 LAB — COMPREHENSIVE METABOLIC PANEL WITH GFR
ALT: 10 U/L (ref 0–44)
AST: 11 U/L — ABNORMAL LOW (ref 15–41)
Albumin: 2.8 g/dL — ABNORMAL LOW (ref 3.5–5.0)
Alkaline Phosphatase: 59 U/L (ref 38–126)
Anion gap: 10 (ref 5–15)
BUN: 31 mg/dL — ABNORMAL HIGH (ref 8–23)
CO2: 19 mmol/L — ABNORMAL LOW (ref 22–32)
Calcium: 8.3 mg/dL — ABNORMAL LOW (ref 8.9–10.3)
Chloride: 106 mmol/L (ref 98–111)
Creatinine, Ser: 1.37 mg/dL — ABNORMAL HIGH (ref 0.61–1.24)
GFR, Estimated: 55 mL/min — ABNORMAL LOW (ref >60)
Glucose, Bld: 136 mg/dL — ABNORMAL HIGH (ref 70–99)
Potassium: 4.2 mmol/L (ref 3.5–5.1)
Sodium: 135 mmol/L (ref 135–145)
Total Bilirubin: 0.7 mg/dL (ref 0.0–1.2)
Total Protein: 6.3 g/dL — ABNORMAL LOW (ref 6.5–8.1)

## 2023-10-12 LAB — TYPE AND SCREEN
ABO/RH(D): A POS
Antibody Screen: NEGATIVE
Unit division: 0
Unit division: 0
Unit division: 0

## 2023-10-12 LAB — BPAM RBC
Blood Product Expiration Date: 202507022359
Blood Product Expiration Date: 202507022359
Blood Product Expiration Date: 202507022359
ISSUE DATE / TIME: 202506031500
ISSUE DATE / TIME: 202506051703
ISSUING PHYSICIAN: 202506051143
Unit Type and Rh: 6200
Unit Type and Rh: 6200
Unit Type and Rh: 6200

## 2023-10-12 LAB — ERYTHROPOIETIN: Erythropoietin: 215.9 m[IU]/mL — ABNORMAL HIGH (ref 2.6–18.5)

## 2023-10-12 MED ORDER — MAGNESIUM SULFATE 2 GM/50ML IV SOLN
2.0000 g | Freq: Once | INTRAVENOUS | Status: AC
  Administered 2023-10-12: 2 g via INTRAVENOUS
  Filled 2023-10-12: qty 50

## 2023-10-12 MED ORDER — FUROSEMIDE 10 MG/ML IJ SOLN
60.0000 mg | Freq: Once | INTRAMUSCULAR | Status: AC
  Administered 2023-10-12: 60 mg via INTRAVENOUS
  Filled 2023-10-12: qty 6

## 2023-10-12 MED ORDER — ROSUVASTATIN CALCIUM 5 MG PO TABS
5.0000 mg | ORAL_TABLET | Freq: Every day | ORAL | 0 refills | Status: DC
  Filled 2023-10-12: qty 30, 30d supply, fill #0

## 2023-10-12 MED ORDER — SPIRONOLACTONE 25 MG PO TABS
12.5000 mg | ORAL_TABLET | Freq: Every day | ORAL | 0 refills | Status: DC
  Filled 2023-10-12: qty 30, 60d supply, fill #0

## 2023-10-12 MED ORDER — SODIUM CHLORIDE 0.9 % IV SOLN: INTRAVENOUS | Status: DC | PRN

## 2023-10-12 NOTE — Progress Notes (Signed)
 Occupational Therapy Treatment Patient Details Name: William Villanueva MRN: [id number] DOB: 01-Dec-1950 Today's Date: 10/12/2023   History of present illness Patient is 73 y.o. male presented to [facility] ED with SOB and diarrhea the night PTA; found to be hypoxic in 80's on RA, mildly volume overloaded and CXR revealed pulmonary edema. Pt recently s/p Barostim device implantation on 10/04/23 to treat his NYHA class III heart failure. 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 DM, gout, lumbar lami/decompression.   OT comments  Educated and provided written handout on the 5 Ps of energy conservation. Recommended tub transfer bench. Pt asking for help with bathroom renovations, provided [employer] [employer] brochure for home modification. Pt is eager to discharge.       If plan is discharge home, recommend the following:  A little help with walking and/or transfers;A little help with bathing/dressing/bathroom;Assistance with cooking/housework;Assist for transportation   Equipment Recommendations  None recommended by OT    Recommendations for Other Services      Precautions / Restrictions Precautions Precautions: Fall Recall of Precautions/Restrictions: Intact Restrictions Weight Bearing Restrictions Per Provider Order: No       Mobility Bed Mobility Overal bed mobility: Modified Independent                  Transfers                         Balance   Sitting-balance support: Feet supported Sitting balance-Leahy Scale: Good                                     ADL either performed or assessed with clinical judgement   ADL                                         General ADL Comments: Pt awaiting discharge. Focus of session on energy conservation education. Recommended tub transfer bench vs wife helping pt step over edge of tub and sitting on 3 in 1, pt has a hand held shower head.     Extremity/Trunk Assessment              Vision       Restaurant manager, fast food Communication: No apparent difficulties   Cognition Arousal: Alert Behavior During Therapy: WFL for tasks assessed/performed Cognition: No apparent impairments                               Following commands: Intact        Cueing   Cueing Techniques: Verbal cues  Exercises      Shoulder Instructions       General Comments VSS on RA with SpO2 >88% throughout session.    Pertinent Vitals/ Pain       Pain Assessment Pain Assessment: No/denies pain  Home Living                                          Prior Functioning/Environment              Frequency  Min 2X/week  Progress Toward Goals  OT Goals(current goals can now be found in the care plan section)  Progress towards OT goals: Progressing toward goals  Acute Rehab OT Goals OT Goal Formulation: With patient Time For Goal Achievement: 10/24/23 Potential to Achieve Goals: Good  Plan      Co-evaluation                 AM-PAC OT "6 Clicks" Daily Activity     Outcome Measure   Help from another person eating meals?: None Help from another person taking care of personal grooming?: A Little Help from another person toileting, which includes using toliet, bedpan, or urinal?: A Little Help from another person bathing (including washing, rinsing, drying)?: A Little Help from another person to put on and taking off regular upper body clothing?: A Little Help from another person to put on and taking off regular lower body clothing?: A Little 6 Click Score: 19    End of Session    OT Visit Diagnosis: Unsteadiness on feet (R26.81);Muscle weakness (generalized) (M62.81);Other (comment)   Activity Tolerance Patient tolerated treatment well   Patient Left in bed;with call bell/phone within reach;with family/visitor present   Nurse  Communication Other (comment) (IV complete)        Time: 1405-1430 OT Time Calculation (min): 25 min  Charges: OT General Charges $OT Visit: 1 Visit OT Treatments $Self Care/Home Management : 23-37 mins  Avanell Leigh, OTR/L Acute Rehabilitation Services Office: [phone]   Jonette Nestle 10/12/2023, 2:43 PM

## 2023-10-12 NOTE — Plan of Care (Signed)

## 2023-10-12 NOTE — Plan of Care (Signed)

## 2023-10-12 NOTE — Discharge Summary (Signed)
 Physician Discharge Summary  William Villanueva XLK:[id number] DOB: [birth year] DOA: 10/08/2023  PCP: Rae Bugler, MD  Admit date: 10/08/2023 Discharge date: 10/12/2023  Time spent:45 minutes  Recommendations for Outpatient Follow-up:  Outpatient palliative care for goals of care and CODE STATUS discussions Oncology Dr. Maria Shiner in 2 to 3 weeks, consideration of bone marrow biopsy Advanced heart failure in 2 weeks   Discharge Diagnoses:    Acute on chronic systolic CHF (congestive heart failure) (HCC)   COPD (chronic obstructive pulmonary disease) (HCC)   CKD stage 3a, GFR 45-59 ml/min (HCC)   Polycythemia   Essential hypertension   DM2 (diabetes mellitus, type 2) (HCC)   GERD (gastroesophageal reflux disease)   Obesity   Discharge Condition: Improved  Diet recommendation: Low-salt, diabetic  Filed Weights   10/10/23 0500 10/11/23 0418 10/12/23 0347  Weight: 106.3 kg 105.4 kg 106.5 kg    History of present illness:  73/M with chronic systolic CHF, NICM, CHB with CRT-D, CKD 3, hypertension presented with nausea vomiting and diarrhea. - Recently underwent right sided Barostim device placement 5/29 -In the ED, creatinine 0.9, BUN 24, BNP 17141, troponin 100, 96, hemoglobin 8.5, flu COVID/RSV negative, chest x-ray with pulmonary vascular congestion bilateral central interstitial infiltrates - Echo noted EF 20-25%, with regional wall motion abnormality, normal RV, mild MR  Hospital Course:   Diarrhea -Developed nausea and diarrhea 24 hours after Barostim, either secondary to excess stimulation or antibiotics administered for procedure  -GI symptoms appear to have resolved    Acute on chronic systolic CHF (congestive heart failure) (HCC) -Echo this admission with EF 20-25%, normal RV - Volume status difficult to assess -Advanced heart failure team following, losartan  held - Given Lasix  with PRBC transfusion, resume torsemide  and Aldactone  at discharge  -GDMT limited by soft  BP - failure to thrive with significant disease burden, outpatient palliative care referral sent   CKD stage 3a, GFR 45-59 ml/min (HCC) Hypokalemia - Mild uptrend, monitor   Acute on chronic anemia - Hemoglobin dropped from 12 range few months ago to 8.5 now - Transfused 3 units PRBC this admission, followed by Dr. 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 .,  This will be held pending outpatient follow-up Hemoglobin has been trending down this year, see discussion above  DM2 (diabetes mellitus, type 2) (HCC) Stable, resume insulin    GERD (gastroesophageal reflux disease) Continue proton pump inhibitors    Obesity Calculated BMI is 33.6 consistent with obesity class 1  Discharge Exam: Vitals:   10/12/23 0842 10/12/23 1103  BP:  (!) 101/51  Pulse: (!) 106 (!) 108  Resp: 14 20  Temp:  97.6 F (36.4 C)  SpO2:  90%   General exam: Appears calm and comfortable chronically ill HEENT: No JVD Respiratory system: Few scattered rhonchi Cardiovascular system: S1 & S2 heard, RRR.  Abd: nondistended, soft and nontender.Normal bowel sounds heard. Central nervous system: Alert and oriented. No focal neurological deficits. Extremities: no edema Skin: No rashes Psychiatry: Flat affect  Discharge Instructions   Discharge Instructions     Amb Referral to Palliative Care   Complete by: As directed    Diet - low sodium heart healthy   Complete by: As directed    Increase activity slowly  Complete by: As directed    No wound care   Complete by: As directed       Allergies as of 10/12/2023       Reactions   Advil [ibuprofen] Itching   Keflex [cephalexin] Diarrhea, Other (See Comments)    Caused C-diff, also   Wellbutrin [bupropion] Hives, Other (See Comments)   Prednisone  Itching, Other (See Comments)   Abdominal pain, also   Temazepam  Other (See Comments)   Dizziness    Trazodone  And Nefazodone Other (See Comments)   Dizziness   Entresto  [sacubitril -valsartan ] Other (See Comments)   Unknown   Prozac [fluoxetine Hcl] Itching        Medication List     STOP taking these medications    Jakafi  10 MG tablet Generic drug: ruxolitinib  phosphate   losartan  25 MG tablet Commonly known as: COZAAR    sodium bicarbonate  650 MG tablet       TAKE these medications    acetaminophen  500 MG tablet Commonly known as: TYLENOL  Take 1,000 mg by mouth daily as needed for headache, mild pain (pain score 1-3) or moderate pain (pain score 4-6).   allopurinol  300 MG tablet Commonly known as: ZYLOPRIM  Take 1 tablet (300 mg total) by mouth daily.   budesonide  0.5 MG/2ML nebulizer solution Commonly known as: PULMICORT  Take 2 mLs (0.5 mg total) by nebulization 2 (two) times daily. (RINSE MOUTH AFTER USE) What changed: when to take this   cholestyramine  4 GM/DOSE powder Commonly known as: QUESTRAN  Take 4 g by mouth daily as needed (diarrhea).   clotrimazole 1 % cream Commonly known as: LOTRIMIN Apply 1 Application topically daily as needed (irritation).   dicyclomine  20 MG tablet Commonly known as: BENTYL  Take 20 mg by mouth 2 (two) times daily.   HYDROcodone -acetaminophen  5-325 MG tablet Commonly known as: NORCO/VICODIN Take 1 tablet by mouth every 6 (six) hours as needed for moderate pain (pain score 4-6).   metoprolol  succinate 25 MG 24 hr tablet Commonly known as: Toprol  XL Take 0.5 tablets (12.5 mg total) by mouth daily.   omeprazole  20 MG capsule Commonly known as: PRILOSEC Take 20 mg by mouth daily.   oxybutynin  15 MG 24 hr tablet Commonly known as: DITROPAN  XL Take 15 mg by mouth in the morning.   rosuvastatin  5 MG tablet Commonly known as:  CRESTOR  Take 1 tablet (5 mg total) by mouth daily. Start taking on: October 13, 2023   spironolactone  25 MG tablet Commonly known as: ALDACTONE  Take 0.5 tablets (12.5 mg total) by mouth daily. Start taking on: October 13, 2023   tamsulosin  0.4 MG Caps capsule Commonly known as: FLOMAX  Take 0.4 mg by mouth daily.   torsemide  20 MG tablet Commonly known as: DEMADEX  Take 20 mg by mouth daily.   Tresiba FlexTouch 100 UNIT/ML FlexTouch Pen Generic drug: insulin  degludec Inject 16 Units into the skin daily after breakfast.               Durable Medical Equipment  (From admission, onward)           Start     Ordered   10/09/23 1627  For home use only DME lightweight manual wheelchair with seat cushion  Once       Comments: Patient suffers from shortness of breath secondary to congestive heart failure which impairs their ability to perform daily activities like bathing, dressing, and toileting in the home.  A walker will not resolve  issue with performing activities of daily living. A wheelchair  will allow patient to safely perform daily activities. Patient is not able to propel themselves in the home using a standard weight wheelchair due to endurance and general weakness. Patient can self propel in the lightweight wheelchair. Length of need 12 months . Accessories: leg rests (LRs), wheel locks, extensions and anti-tippers.   10/09/23 1627           Allergies  Allergen Reactions   Advil [Ibuprofen] Itching   Keflex [Cephalexin] Diarrhea and Other (See Comments)    Caused C-diff, also    Wellbutrin [Bupropion] Hives and Other (See Comments)   Prednisone  Itching and Other (See Comments)    Abdominal pain, also    Temazepam  Other (See Comments)    Dizziness    Trazodone  And Nefazodone Other (See Comments)    Dizziness   Entresto  [Sacubitril -Valsartan ] Other (See Comments)    Unknown   Prozac [Fluoxetine Hcl] Itching    Follow-up Information     Rae Bugler, MD. Go  in 14 day(s).   Specialty: Family Medicine Why: HF CSW called to schedule patients hospital follow up appointment for Friday, October 26, 2023 at 9:45 AM.  PLEASE ARRIVE 10-15 minutes early.  PLEASE call to cancel/reschedule appointment if you CANNOT make it. Contact information: [address] [place] [address] [phone]                  The results of significant diagnostics from this hospitalization (including imaging, microbiology, ancillary and laboratory) are listed below for reference.    Significant Diagnostic Studies: US  Abdomen Limited Result Date: 10/11/2023 CLINICAL DATA:  44034 Polycythemia 74259 EXAM: ULTRASOUND ABDOMEN LIMITED COMPARISON:  June 03, 2022 FINDINGS: Enlarged spleen, measuring 15.4 x 6.4 x 5.6 cm (280 mL). No visualized mass. Normal echogenicity. 2.7 cm cyst in the partially visualized left kidney. No ascites. IMPRESSION: Moderate splenomegaly, unchanged by my measurement. No splenic mass. Electronically Signed   By: Rance Burrows M.D.   On: 10/11/2023 11:54   DG CHEST PORT 1 VIEW Result Date: 10/10/2023 CLINICAL DATA:  Heart failure EXAM: PORTABLE CHEST 1 VIEW COMPARISON:  October 08, 2023 FINDINGS: Heart normal size. Bilateral pulmonary interstitial infiltrates correlate with congestive changes without significant pleural effusions No consolidations No change in the left subclavian pacemaker device No change in the right hemithorax stimulator pacer device with the electrode traveling to the lower neck. IMPRESSION: Mild congestive changes.  Mild bilateral congestive changes Electronically Signed   By: Fredrich Jefferson M.D.   On: 10/10/2023 09:35   ECHOCARDIOGRAM COMPLETE Result Date: 10/09/2023    ECHOCARDIOGRAM REPORT   Patient Name:   William Villanueva Date of Exam: 10/09/2023 Medical Rec #:  [id number]          Height:       70.0 in Accession #:    [id number]         Weight:       234.3 lb Date of Birth:  03/02/1951           BSA:          2.233  m Patient Age:    73 years           BP:           107/47 mmHg Patient Gender: M                  HR:           105 bpm. Exam Location:  Inpatient Procedure: 2D Echo,  Cardiac Doppler, Color Doppler and Intracardiac            Opacification Agent (Both Spectral and Color Flow Doppler were            utilized during procedure). Indications:    CHF  History:        Patient has prior history of Echocardiogram examinations, most                 recent 12/22/2022. Risk Factors:Hypertension.  Sonographer:    Janette Medley Referring Phys: [id number] CAROLE N HALL IMPRESSIONS  1. Left ventricular ejection fraction, by estimation, is 20 to 25%. The left ventricle has severely decreased function. The left ventricle demonstrates regional wall motion abnormalities (see scoring diagram/findings for description). The left ventricular internal cavity size was moderately dilated. Indeterminate diastolic filling due to E-A fusion.  2. Right ventricular systolic function is normal. The right ventricular size is normal. Tricuspid regurgitation signal is inadequate for assessing PA pressure.  3. Left atrial size was moderately dilated.  4. The mitral valve is normal in structure. Mild mitral valve regurgitation. No evidence of mitral stenosis.  5. The aortic valve is tricuspid. Aortic valve regurgitation is not visualized. No aortic stenosis is present.  6. The inferior vena cava is dilated in size with >50% respiratory variability, suggesting right atrial pressure of 8 mmHg. Comparison(s): No significant change from prior study. Prior images reviewed side by side. FINDINGS  Left Ventricle: There is no left ventricular thrombus (Definity  contrast was used). There is global hypokinesis, but the inferior and inferolateral walss appear akinetic, consistent with infarction/scar in the right cooronary artery distribution. Left ventricular ejection fraction, by estimation, is 20 to 25%. The left ventricle has severely decreased function. The left  ventricle demonstrates regional wall motion abnormalities. The left ventricular internal cavity size was moderately dilated. There is  no left ventricular hypertrophy. Indeterminate diastolic filling due to E-A fusion. Right Ventricle: The right ventricular size is normal. No increase in right ventricular wall thickness. Right ventricular systolic function is normal. Tricuspid regurgitation signal is inadequate for assessing PA pressure. Left Atrium: Left atrial size was moderately dilated. Right Atrium: Right atrial size was normal in size. Pericardium: There is no evidence of pericardial effusion. Mitral Valve: The mitral valve is normal in structure. Mild to moderate mitral annular calcification. Mild mitral valve regurgitation. No evidence of mitral valve stenosis. Tricuspid Valve: The tricuspid valve is normal in structure. Tricuspid valve regurgitation is trivial. Aortic Valve: The aortic valve is tricuspid. Aortic valve regurgitation is not visualized. No aortic stenosis is present. Pulmonic Valve: The pulmonic valve was not well visualized. Pulmonic valve regurgitation is not visualized. No evidence of pulmonic stenosis. Aorta: The aortic root and ascending aorta are structurally normal, with no evidence of dilitation. Venous: The inferior vena cava is dilated in size with greater than 50% respiratory variability, suggesting right atrial pressure of 8 mmHg. IAS/Shunts: No atrial level shunt detected by color flow Doppler. Additional Comments: A device lead is visualized in the right ventricle and right atrium.  LEFT VENTRICLE PLAX 2D LVIDd:         6.50 cm      Diastology LVIDs:         5.80 cm      LV e' medial:   4.57 cm/s LV PW:         0.90 cm      LV E/e' medial: 25.4 LV IVS:        1.10 cm LVOT  diam:     2.30 cm LV SV:         79 LV SV Index:   35 LVOT Area:     4.15 cm  LV Volumes (MOD) LV vol d, MOD A2C: 270.0 ml LV vol d, MOD A4C: 207.0 ml LV vol s, MOD A2C: 177.0 ml LV vol s, MOD A4C: 131.0 ml LV  SV MOD A2C:     93.0 ml LV SV MOD A4C:     207.0 ml LV SV MOD BP:      83.1 ml RIGHT VENTRICLE             IVC RV S prime:     16.00 cm/s  IVC diam: 2.40 cm TAPSE (M-mode): 2.9 cm LEFT ATRIUM             Index        RIGHT ATRIUM           Index LA diam:        4.10 cm 1.84 cm/m   RA Area:     17.90 cm LA Vol (A2C):   73.9 ml 33.10 ml/m  RA Volume:   48.60 ml  21.77 ml/m LA Vol (A4C):   66.5 ml 29.78 ml/m LA Biplane Vol: 71.9 ml 32.20 ml/m  AORTIC VALVE LVOT Vmax:   127.00 cm/s LVOT Vmean:  87.000 cm/s LVOT VTI:    0.189 m  AORTA Ao Root diam: 3.30 cm Ao Asc diam:  3.80 cm MITRAL VALVE MV Area (PHT): 4.41 cm     SHUNTS MV Decel Time: 172 msec     Systemic VTI:  0.19 m MV E velocity: 116.00 cm/s  Systemic Diam: 2.30 cm Karyl Paget Croitoru MD Electronically signed by Luana Rumple MD Signature Date/Time: 10/09/2023/4:01:55 PM    Final    DG Chest 2 View Result Date: 10/08/2023 CLINICAL DATA:  Shortness of breath EXAM: CHEST - 2 VIEW COMPARISON:  Chest radiograph dated 01/08/2023 FINDINGS: Lines/tubes: Left chest wall ICD leads project over the right atrium and ventricle and tributary of the coronary sinus. Right chest wall stimulator device with lead terminating above the field of view. Lungs: Mildly low lung volumes. Increased bibasilar interstitial and patchy opacities. Pleura: Trace blunting of bilateral costophrenic angles. No pneumothorax. Heart/mediastinum: Enlarged cardiomediastinal silhouette. Bones: No acute osseous abnormality. IMPRESSION: 1. Increased bibasilar interstitial and patchy opacities, which may represent pulmonary edema or infection. 2. Trace blunting of bilateral costophrenic angles, which may represent trace pleural effusions. 3. Cardiomegaly. Electronically Signed   By: Limin  Xu M.D.   On: 10/08/2023 15:39    Microbiology: Recent Results (from the past 240 hours)  Resp panel by RT-PCR (RSV, Flu A&B, Covid) Anterior Nasal Swab     Status: None   Collection Time: 10/08/23  2:20 PM    Specimen: Anterior Nasal Swab  Result Value Ref Range Status   SARS Coronavirus 2 by RT PCR NEGATIVE NEGATIVE Final    Comment: (NOTE) SARS-CoV-2 target nucleic acids are NOT DETECTED.  The SARS-CoV-2 RNA is generally detectable in upper respiratory specimens during the acute phase of infection. The lowest concentration of SARS-CoV-2 viral copies this assay can detect is 138 copies/mL. A negative result does not preclude SARS-Cov-2 infection and should not be used as the sole basis for treatment or other patient management decisions. A negative result may occur with  improper specimen collection/handling, submission of specimen other than nasopharyngeal swab, presence of viral mutation(s) within the areas targeted by this assay, and inadequate  number of viral copies(<138 copies/mL). A negative result must be combined with clinical observations, patient history, and epidemiological information. The expected result is Negative.  Fact Sheet for Patients:  BloggerCourse.com  Fact Sheet for Healthcare Providers:  SeriousBroker.it  This test is no t yet approved or cleared by the [place]  FDA and  has been authorized for detection and/or diagnosis of SARS-CoV-2 by FDA under an Emergency Use Authorization (EUA). This EUA will remain  in effect (meaning this test can be used) for the duration of the COVID-19 declaration under Section 564(b)(1) of the Act, 21 U.S.C.section 360bbb-3(b)(1), unless the authorization is terminated  or revoked sooner.       Influenza A by PCR NEGATIVE NEGATIVE Final   Influenza B by PCR NEGATIVE NEGATIVE Final    Comment: (NOTE) The Xpert Xpress SARS-CoV-2/FLU/RSV plus assay is intended as an aid in the diagnosis of influenza from Nasopharyngeal swab specimens and should not be used as a sole basis for treatment. Nasal washings and aspirates are unacceptable for Xpert Xpress  SARS-CoV-2/FLU/RSV testing.  Fact Sheet for Patients: BloggerCourse.com  Fact Sheet for Healthcare Providers: SeriousBroker.it  This test is not yet approved or cleared by the [place]  FDA and has been authorized for detection and/or diagnosis of SARS-CoV-2 by FDA under an Emergency Use Authorization (EUA). This EUA will remain in effect (meaning this test can be used) for the duration of the COVID-19 declaration under Section 564(b)(1) of the Act, 21 U.S.C. section 360bbb-3(b)(1), unless the authorization is terminated or revoked.     Resp Syncytial Virus by PCR NEGATIVE NEGATIVE Final    Comment: (NOTE) Fact Sheet for Patients: BloggerCourse.com  Fact Sheet for Healthcare Providers: SeriousBroker.it  This test is not yet approved or cleared by the [place]  FDA and has been authorized for detection and/or diagnosis of SARS-CoV-2 by FDA under an Emergency Use Authorization (EUA). This EUA will remain in effect (meaning this test can be used) for the duration of the COVID-19 declaration under Section 564(b)(1) of the Act, 21 U.S.C. section 360bbb-3(b)(1), unless the authorization is terminated or revoked.  Performed at [facility], [address] [place], [place] [address]   Blood culture (routine x 2)     Status: None (Preliminary result)   Collection Time: 10/08/23  6:14 PM   Specimen: BLOOD  Result Value Ref Range Status   Specimen Description   Final    BLOOD RIGHT ANTECUBITAL Performed at [facility], [address] [place], [place] [address]    Special Requests   Final    BOTTLES DRAWN AEROBIC AND ANAEROBIC Blood Culture results may not be optimal due to an excessive volume of blood received in culture bottles Performed at [facility], [address] [place], [place] [address]    Culture   Final    NO GROWTH 4  DAYS Performed at [facility], [address] [place], [place] [address]    Report Status PENDING  Incomplete  Blood culture (routine x 2)     Status: None (Preliminary result)   Collection Time: 10/09/23  2:56 AM   Specimen: BLOOD RIGHT HAND  Result Value Ref Range Status   Specimen Description BLOOD RIGHT HAND  Final   Special Requests   Final    BOTTLES DRAWN AEROBIC AND ANAEROBIC Blood Culture adequate volume   Culture   Final    NO GROWTH 3 DAYS Performed at [facility], [address] [place], [place] [address]    Report Status PENDING  Incomplete     Labs: Basic Metabolic Panel: Recent Labs  Lab 10/08/23 1420 10/09/23 0258 10/10/23 0232 10/11/23 0228 10/12/23 0312  NA 140 139 134* 133* 135  K 3.9 3.4* 4.5 4.3 4.2  CL 107 109 104 106 106  CO2 19* 19* 19* 19* 19*  GLUCOSE 113* 131* 117* 122* 136*  BUN 24* 25* 27* 33* 31*  CREATININE 0.94 1.17 1.41* 1.74* 1.37*  CALCIUM  8.4* 8.2* 8.5* 8.3* 8.3*  MG  --  1.4* 1.9  --   --   PHOS  --  3.3  --   --   --    Liver Function Tests: Recent Labs  Lab 10/08/23 1420 10/11/23 0228 10/12/23 0312  AST 17 10* 11*  ALT 11 9 10   ALKPHOS 82 53 59  BILITOT 0.6 0.8 0.7  PROT 6.7 6.2* 6.3*  ALBUMIN  3.8 2.8* 2.8*   No results for input(s): "LIPASE", "AMYLASE" in the last 168 hours. No results for input(s): "AMMONIA" in the last 168 hours. CBC: Recent Labs  Lab 10/08/23 1420 10/09/23 0258 10/09/23 0646 10/09/23 1401 10/10/23 0232 10/11/23 0228 10/12/23 0312  WBC 6.5 6.3  --   --  6.3 5.8 6.6  NEUTROABS  --   --   --   --   --  4.6 5.2  HGB 8.5* 8.0* 7.9* 8.1* 8.6* 8.1* 10.0*  HCT 26.4* 25.5* 24.6* 25.3* 26.6* 25.0* 30.0*  MCV 103.1* 104.9*  --   --  102.3* 100.8* 95.8  PLT 260 281  --   --  280 316 326   Cardiac Enzymes: No results for input(s): "CKTOTAL", "CKMB", "CKMBINDEX", "TROPONINI" in the last 168 hours. BNP: BNP (last 3 results) Recent Labs    12/21/22 1922 01/08/23 1801 07/18/23 1244   BNP 889.4* 1,183.7* 612.8*    ProBNP (last 3 results) Recent Labs    08/28/23 0909 10/08/23 1420  PROBNP 6,803.0* 17,141.0*    CBG: Recent Labs  Lab 10/10/23 2008  GLUCAP 184*       Signed:  Deforest Fast MD.  [facility] 10/12/2023, 12:07 PM

## 2023-10-12 NOTE — TOC Progression Note (Signed)
 Transition of Care [facility]) - Progression Note    Patient Details  Name: William Villanueva MRN: [id number] Date of Birth: July 27, 1950  Transition of Care [facility] CM/SW Contact  Ernst Heap Phone Number: [phone] 10/12/2023, 11:27 AM  Clinical Narrative:   HF CSW called to schedule patients hospital follow up appointment for Friday, October 26, 2023 at 9:45 AM.   [facility] will continue following.     Expected Discharge Plan: Home/Self Care    Expected Discharge Plan and Services       Living arrangements for the past 2 months: Single Family Home                                       Social Determinants of Health (SDOH) Interventions SDOH Screenings   Food Insecurity: No Food Insecurity (10/09/2023)  Housing: Low Risk  (10/09/2023)  Transportation Needs: No Transportation Needs (10/09/2023)  Utilities: Not At Risk (10/09/2023)  Alcohol  Screen: Low Risk  (02/06/2022)  Financial Resource Strain: Low Risk  (02/06/2022)  Social Connections: Moderately Integrated (10/09/2023)  Tobacco Use: Medium Risk (10/08/2023)    Readmission Risk Interventions     No data to display

## 2023-10-12 NOTE — Progress Notes (Signed)
 Mr. Longmore got his 2 units of blood yesterday.  He is on Procrit .  Today, his hemoglobin is 10.  His stool is heme negative.  He is still having some shortness of breath.  Again with a low cardiac ejection fraction, I think this may be a problem.  I am not sure how this can be corrected.  I do not know if he needs any dobutamine.  I am not sure if his blood pressure can handle any of the oral inotropes.  His chemistry lab shows sodium 135.  Potassium 4.2.  BUN 31 creatinine 1.37.  Calcium  8.3 with an albumin  of 2.8.  White cell count 6.6.  Hemoglobin 10.  Platelet count 326,000.  Hopefully, the Procrit  will be able to further increase his hemoglobin.  Again I talked him about a bone marrow biopsy.  I really think we are going to need 1.  However, we can get this as an outpatient.  Clearly, his problem is going to be cardiac.  He has such a low ejection fraction.  I know he had the Barostim placed.  Maybe, this will be able to help.  He did have an ultrasound of his abdomen yesterday.  There is a splenomegaly.  However, it certainly is not impressive.   His vital signs are temperature 97.9.  Pulse 112.  Blood pressure 112/70.  Oxygen  saturation is 93%.  He is on supplemental oxygen .  His lungs sound okay.  There may be a little bit of wheezing.  Cardiac exam tachycardic but regular.  He has an occasional extra beat.  His abdominal exam is obese but soft.  Bowel sounds are present.  He has no guarding or rebound tenderness.  There is no obvious splenomegaly.  The extremities shows no edema.  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  The fact that his spleen is not larger is encouraging.  He did respond to the transfusion.  At least, this should help with his heart.  I do appreciate all the great care is gained from everybody on 3 E.   Rayleen Cal, MD  Melodie Spry 1:9

## 2023-10-12 NOTE — Progress Notes (Signed)
 Physical Therapy Treatment Patient Details Name: William Villanueva MRN: [id number] DOB: [birth year] Today's Date: 10/12/2023   History of Present Illness Patient is 73 y.o. male presented to [facility] ED with SOB and diarrhea the night PTA; found to be hypoxic in 80's on RA, mildly volume overloaded and CXR revealed pulmonary edema. Pt recently s/p Barostim device implantation on 10/04/23 to treat his NYHA class III heart failure. 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 DM, gout, lumbar lami/decompression.    PT Comments  Pt greeted seated in recliner chair, pleasant and agreeable to PT session. PT brought a manual w/c to assess the fit for the pt and determined a 22" w/c would be appropriate. A w/c would allow him to mobilize longer distance within the community. His wife is requesting a lightweight w/c if possible in order for to be able to lift the w/c in and out of the car. Pt engaged in three short distance bouts of ambulation using RW. He required seated rest breaks d/t fatigue, but maintained SpO2 >88% on RA. Will continue to follow acutely and advance appropriately.      If plan is discharge home, recommend the following: A little help with walking and/or transfers;A little help with bathing/dressing/bathroom;Assistance with cooking/housework;Assist for transportation;Help with stairs or ramp for entrance   Can travel by private vehicle        Equipment Recommendations  Wheelchair (measurements PT);Wheelchair cushion (measurements PT) (22"; lightweight if possible to allow pt's wife to lift the AD in/out of the car.)    Recommendations for Other Services       Precautions / Restrictions Precautions Precautions: Fall Recall of Precautions/Restrictions: Intact Restrictions Weight Bearing Restrictions Per Provider Order: No     Mobility  Bed Mobility               General bed mobility comments: Not assessed. Pt greeted seated in recliner  chair and returned there at end of session.    Transfers Overall transfer level: Needs assistance Equipment used: None, Rolling walker (2 wheels) Transfers: Sit to/from Stand, Bed to chair/wheelchair/BSC Sit to Stand: Supervision   Step pivot transfers: Supervision       General transfer comment: Pt stood from recliner chair and transferred to w/c with short slow steps without an AD, but reached for arm rests for support. He sat in the w/c to assess fit. Pt stood from w/c by pushing up on arm rests without an AD. Good eccentric control with sitting. Pt used RW during gait and demonstrated proper hand placement and sequencing with the AD when completing sit<>stand during rest breaks from a chair.    Ambulation/Gait Ambulation/Gait assistance: Contact guard assist Gait Distance (Feet): 60 Feet (1x60, seated rest, 1x30, seated rest, 1x50) Assistive device: Rolling walker (2 wheels) Gait Pattern/deviations: Step-through pattern, Decreased step length - right, Decreased step length - left, Decreased stride length, Drifts right/left Gait velocity: decr Gait velocity interpretation: <1.31 ft/sec, indicative of household ambulator   General Gait Details: Pt ambulated with a reciprocal slow gait pattern. He demonstrated even weight shift and adequate foot clearence. As pt fatigued he became unsteady. Intermittent seated rest breaks to allow pt to recover. Cued pt to maintain body inside the RW at all times. He tended to keep his RLE outside the device when turning and would lift the RW off the floor when turning. Educated pt to maintain all four points in contact with the ground, to push down through the RW  grips to increased stability, and improve his proximity to RW.   Stairs             Wheelchair Mobility     Tilt Bed    Modified Rankin (Stroke Patients Only)       Balance Overall balance assessment: Needs assistance Sitting-balance support: Feet supported Sitting  balance-Leahy Scale: Good     Standing balance support: During functional activity, Bilateral upper extremity supported, Reliant on assistive device for balance Standing balance-Leahy Scale: Poor Standing balance comment: Pt transferred with the use of arm rests and was dependent on the RW for stability during gait.                            Communication Communication Communication: No apparent difficulties  Cognition Arousal: Alert Behavior During Therapy: WFL for tasks assessed/performed   PT - Cognitive impairments: No apparent impairments                         Following commands: Intact      Cueing Cueing Techniques: Verbal cues  Exercises      General Comments General comments (skin integrity, edema, etc.): VSS on RA with SpO2 >88% throughout session.      Pertinent Vitals/Pain Pain Assessment Pain Assessment: No/denies pain    Home Living                          Prior Function            PT Goals (current goals can now be found in the care plan section) Acute Rehab PT Goals Patient Stated Goal: Return Home without O2 Progress towards PT goals: Progressing toward goals    Frequency    Min 2X/week      PT Plan      Co-evaluation              AM-PAC PT "6 Clicks" Mobility   Outcome Measure  Help needed turning from your back to your side while in a flat bed without using bedrails?: A Little Help needed moving from lying on your back to sitting on the side of a flat bed without using bedrails?: A Little Help needed moving to and from a bed to a chair (including a wheelchair)?: A Little Help needed standing up from a chair using your arms (e.g., wheelchair or bedside chair)?: A Little Help needed to walk in hospital room?: A Little Help needed climbing 3-5 steps with a railing? : A Little 6 Click Score: 18    End of Session Equipment Utilized During Treatment: Gait belt Activity Tolerance: Patient  tolerated treatment well;Patient limited by fatigue Patient left: in chair;with call bell/phone within reach;with family/visitor present Nurse Communication: Mobility status PT Visit Diagnosis: Muscle weakness (generalized) (M62.81);Difficulty in walking, not elsewhere classified (R26.2);Other abnormalities of gait and mobility (R26.89);Unsteadiness on feet (R26.81)     Time: 1610-9604 PT Time Calculation (min) (ACUTE ONLY): 23 min  Charges:    $Gait Training: 23-37 mins PT General Charges $$ ACUTE PT VISIT: 1 Visit                     Glenford Lanes, PT, DPT Acute Rehabilitation Services Office: [phone] Secure Chat Preferred  Riva Chester 10/12/2023, 2:02 PM

## 2023-10-12 NOTE — Progress Notes (Signed)
 Pt occult stool sample came out negative. Had large BM, soft but not watery, large .

## 2023-10-12 NOTE — Progress Notes (Signed)
 Advanced Heart Failure Rounding Note  Cardiologist: Alexandria Angel, MD  Chief Complaint: Heart Failure  Subjective:   6/3: 1UPRBC. Diuresed with IV lasix . Sluggish diuresis noted.  6/5: 2 u RBCs.  Hgb 10. Got 2uPRBC yesterday + lasix   Feels good this morning. Sitting up in chair eating breakfast. Denies CP/SOB. Has been getting up and moving around. No further diarrhea.   Objective:    Weight Range: 106.5 kg Body mass index is 33.69 kg/m.   Vital Signs:   Temp:  [97.6 F (36.4 C)-98.5 F (36.9 C)] 97.6 F (36.4 C) (06/06 0644) Pulse Rate:  [97-114] 112 (06/06 0347) Resp:  [13-21] 19 (06/06 0347) BP: (91-128)/(41-70) 107/68 (06/06 0644) SpO2:  [90 %-100 %] 92 % (06/06 0347) Weight:  [106.5 kg] 106.5 kg (06/06 0347) Last BM Date : 10/08/23 (Gave prune juice)  Weight change: Filed Weights   10/10/23 0500 10/11/23 0418 10/12/23 0347  Weight: 106.3 kg 105.4 kg 106.5 kg   Intake/Output:  Intake/Output Summary (Last 24 hours) at 10/12/2023 0748 Last data filed at 10/12/2023 0155 Gross per 24 hour  Intake 1552 ml  Output 400 ml  Net 1152 ml    Physical Exam  General:  elderly appearing.  No respiratory difficulty. Generalized ecchymosis Neck: supple. JVD ~8 cm.  Cor: PMI nondisplaced. Regular rate & rhythm. No rubs, gallops or murmurs. Lungs: clear Extremities: no cyanosis, clubbing, rash, edema  Neuro: alert & oriented x 3. Moves all 4 extremities w/o difficulty. Affect pleasant.   Telemetry   ST 100s (personally reviewed)  EKG    N/A  Labs    CBC Recent Labs    10/11/23 0228 10/12/23 0312  WBC 5.8 6.6  NEUTROABS 4.6 5.2  HGB 8.1* 10.0*  HCT 25.0* 30.0*  MCV 100.8* 95.8  PLT 316 326   Basic Metabolic Panel Recent Labs    40/98/11 0232 10/11/23 0228 10/12/23 0312  NA 134* 133* 135  K 4.5 4.3 4.2  CL 104 106 106  CO2 19* 19* 19*  GLUCOSE 117* 122* 136*  BUN 27* 33* 31*  CREATININE 1.41* 1.74* 1.37*  CALCIUM  8.5* 8.3* 8.3*  MG 1.9  --    --    Liver Function Tests Recent Labs    10/11/23 0228 10/12/23 0312  AST 10* 11*  ALT 9 10  ALKPHOS 53 59  BILITOT 0.8 0.7  PROT 6.2* 6.3*  ALBUMIN  2.8* 2.8*   BNP (last 3 results) Recent Labs    12/21/22 1922 01/08/23 1801 07/18/23 1244  BNP 889.4* 1,183.7* 612.8*   ProBNP (last 3 results) Recent Labs    08/28/23 0909 10/08/23 1420  PROBNP 6,803.0* 17,141.0*   D-Dimer Recent Labs    10/09/23 0813  DDIMER 2.52*   Imaging   US  Abdomen Limited Result Date: 10/11/2023 CLINICAL DATA:  91478 Polycythemia 29562 EXAM: ULTRASOUND ABDOMEN LIMITED COMPARISON:  June 03, 2022 FINDINGS: Enlarged spleen, measuring 15.4 x 6.4 x 5.6 cm (280 mL). No visualized mass. Normal echogenicity. 2.7 cm cyst in the partially visualized left kidney. No ascites. IMPRESSION: Moderate splenomegaly, unchanged by my measurement. No splenic mass. Electronically Signed   By: Rance Burrows M.D.   On: 10/11/2023 11:54    Medications:    Scheduled Medications:  allopurinol   300 mg Oral Daily   budesonide   2 mL Nebulization BID   enoxaparin  (LOVENOX ) injection  50 mg Subcutaneous Q24H   epoetin alfa  40,000 Units Subcutaneous Daily   furosemide   40 mg Intravenous Once  oxybutynin   15 mg Oral q AM   pantoprazole   40 mg Oral Daily   rosuvastatin   5 mg Oral Daily   spironolactone   12.5 mg Oral Daily   tamsulosin   0.4 mg Oral Daily   Infusions:  PRN Medications: acetaminophen , alum & mag hydroxide-simeth, artificial tears, cholestyramine , HYDROcodone -acetaminophen , melatonin, polyethylene glycol, prochlorperazine  Patient Profile   Mr.Cedillo is a 73 year old male with history of nonischemic cardiomyopathy, systolic heart failure, complete heart block status post CRT-D, CKD, hypertension that presents today with fatigue and diarrhea.   Assessment/Plan   Acute on Chronic systolic heart failure: Patient with borderline ACC/AHA stage D cardiomyopathy with NYHA class IIIb symptoms. RHC  4/25 showed borderline index. He is not interested in LVAD at this time, suspect borderline candidate with his deconditioning. Amyloid workup negative - s/p Barostim 10/04/23 - proBNP 17k, up from 6K in April. - NYHA IIIb-IV. Dyspneic with minimal exertion. Volume status mildly elevated. Give 60 IV Lasix  x1  - Can consider RHC down the road to further assess hemodynamics. - Hold losartan  with soft BP - Restart home Toprol  XL 12.5 mg QHS - Continue spiro 12.5 mg daily - will touch base with research about COMET trial; as previously noted by Dr. Alease Amend   Anemia - h/o anemia of chronic disease. Now with concern for blood loss.  - hgb 12.9 on 08/09/23. Now down to 8.5 on admit - 6/3 1u PRBC- Hgb 8>8.6. No obvious source.   - seen by Dr. Maria Shiner, oncology > 2 u RBCs + ESA ordered. Hgb 10 this morning   AKI on CKD 3a - b/l 1.3-1.4 - Cr bumped to 1.7 yesterday. Improved with RBCs. 1.37 today   CHB: CRT-D in place, follows with EP   HTN: Pressure a little better today. Continue to hold GDMT.    HLD: continue crestor  5 mg daily   Diarrhea - has intermittently at home; continue Questran   - could be worsened by new baro stimulator. - No diarrhea in 3 days    Diabetes: On SSI, follows with PCP. Last A1c 5.6   ID: recent intubation 5/29. - Bcx- NGTD; procal 0.11 - WBC is not elevated. Afebrile.  - no concern for infx at this time  Appears stable for discharge. Will arrange follow up in AHF clinic.   AHF meds at discharge: Rosuvastatin  5 mg daily Spiro 12.5 mg daily Toprol  XL 12.5 mg at bedtime Torsemide  20 mg daily  Length of Stay: 3  Sheryl Donna, NP  10/12/2023, 7:48 AM  Advanced Heart Failure Team Pager [phone] (M-F; 7a - 5p)  Please contact [facility] for night-coverage after hours (5p -7a ) and weekends on [email or web address]

## 2023-10-13 LAB — CULTURE, BLOOD (ROUTINE X 2): Culture: NO GROWTH

## 2023-10-14 LAB — CULTURE, BLOOD (ROUTINE X 2)
Culture: NO GROWTH
Special Requests: ADEQUATE

## 2023-10-15 ENCOUNTER — Telehealth: Payer: Self-pay | Admitting: *Deleted

## 2023-10-15 ENCOUNTER — Telehealth: Payer: Self-pay

## 2023-10-15 NOTE — Telephone Encounter (Signed)
 Received call from Devra Fontana, patients wife stating that patient has been off of Jakafi  and Hydrea  since his recent hospitalization and asked when dr Maria Shiner wants patient to go back on something for his platelets.  Per Dr Maria Shiner, hold off on these medications in case they were making him anemic and patient will see Dr Maria Shiner next Wednesday and they will decide more then.  Wife appreciates call and verbalized understanding

## 2023-10-15 NOTE — Telephone Encounter (Signed)
 Copied from CRM [id number]. Topic: General - Other >> Oct 12, 2023  2:41 PM Dorisann Garre T wrote: Reason for CRM: patient hospice referral has been received  patient will start hospice if there is any questions are concerns you can reach out to courtney at  [phone] >> Oct 12, 2023  3:05 PM Brandy W wrote: Jearlean Mince with [facility] called to notify that they have received and accepted the hospice referral placed by Dr. Deforest Fast ([facility]) while the patient was admitted to [facility].  Patient's listed PCP is Dr. Rae Bugler with [facility], who is not affiliated with [facility].  Routing to [facility] for continuity and documentation. Jearlean Mince can be reached at [phone] with any questions.  Sent to wrong pcp office.

## 2023-10-16 LAB — HUMAN PARVOVIRUS DNA DETECTION BY PCR: Parvovirus B19, PCR: NEGATIVE

## 2023-10-18 ENCOUNTER — Ambulatory Visit: Attending: Internal Medicine | Admitting: Internal Medicine

## 2023-10-18 ENCOUNTER — Encounter: Payer: Self-pay | Admitting: Internal Medicine

## 2023-10-18 ENCOUNTER — Telehealth (HOSPITAL_COMMUNITY): Payer: Self-pay

## 2023-10-18 VITALS — BP 104/65 | HR 83 | Resp 16 | Ht 70.0 in | Wt 234.0 lb

## 2023-10-18 DIAGNOSIS — M1811 Unilateral primary osteoarthritis of first carpometacarpal joint, right hand: Secondary | ICD-10-CM | POA: Diagnosis not present

## 2023-10-18 DIAGNOSIS — R252 Cramp and spasm: Secondary | ICD-10-CM

## 2023-10-18 DIAGNOSIS — Z5181 Encounter for therapeutic drug level monitoring: Secondary | ICD-10-CM | POA: Diagnosis not present

## 2023-10-18 DIAGNOSIS — M1A09X0 Idiopathic chronic gout, multiple sites, without tophus (tophi): Secondary | ICD-10-CM

## 2023-10-18 DIAGNOSIS — M25511 Pain in right shoulder: Secondary | ICD-10-CM

## 2023-10-18 MED ORDER — LIDOCAINE HCL 1 % IJ SOLN
2.0000 mL | INTRAMUSCULAR | Status: AC | PRN
  Administered 2023-10-18: 2 mL

## 2023-10-18 MED ORDER — TRIAMCINOLONE ACETONIDE 40 MG/ML IJ SUSP
10.0000 mg | INTRAMUSCULAR | Status: AC | PRN
  Administered 2023-10-18: 10 mg via INTRA_ARTICULAR

## 2023-10-18 MED ORDER — TRIAMCINOLONE ACETONIDE 40 MG/ML IJ SUSP
40.0000 mg | INTRAMUSCULAR | Status: AC | PRN
  Administered 2023-10-18: 40 mg via INTRA_ARTICULAR

## 2023-10-18 MED ORDER — LIDOCAINE HCL 1 % IJ SOLN
0.2500 mL | INTRAMUSCULAR | Status: AC | PRN
  Administered 2023-10-18: .25 mL

## 2023-10-18 NOTE — Progress Notes (Signed)
 HF mD: Dr Alease Amend.  Chief Complaint: Heart Failure   HPI: William Villanueva is a 73 y.o. male with a history of nonischemic cardiomyopathy, systolic heart failure, complete heart block status post CRT-D, CKD, and hypertension.   Patient's cardiac history dates back to 2013 when he originally presented with an echocardiogram with EF of 35%; cardiac cath with nonobstructive CAD at that time.  He had a follow-up cardiac MRI in 2013 with EF of 35%, diffuse hypokinesis and no LGE.  He was admitted again in December 2015 with high degree AV block now status post CRT-P.  Last year he had multiple admissions for decompensated heart failure and a rapid decline in functional status.  During outpatient discussions, patient did not wish for any advanced therapies including LVAD.  Due to worsening functional status, patient proceeded with Barostim placement on 10/04/2023.    Admitted with fatigue and diarrhea.  There was concern this was related to Barostim but his wife reported he has chronic GI issues.  Echo EF 20-25%. Hgb was down with no obvious source. Given couple units of blood. Hgb with appropriate rise  Today he returns for post hospital follow up. Having onging issues with diarrhea. Complaining of fatigue with ADLS. Remains SOB with exeriton. Denies PND/Orthopnea. Appetite ok. No fever or chills. Weight at home 230   pounds. Taking all medications.   ROS: All systems negative except as listed in HPI, PMH and Problem List.  SH:  Social History   Socioeconomic History   Marital status: Married    Spouse name: Devra Fontana   Number of children: 0   Years of education: Not on file   Highest education level: High school graduate  Occupational History   Occupation: Retired  Tobacco Use   Smoking status: Former    Current packs/day: 0.00    Average packs/day: 1 pack/day for 44.8 years (44.8 ttl pk-yrs)    Types: Cigarettes    Start date: 06/05/1968    Quit date: 04/05/2013    Years since quitting:  10.5    Passive exposure: Never   Smokeless tobacco: Never  Vaping Use   Vaping status: Never Used  Substance and Sexual Activity   Alcohol  use: Yes    Alcohol /week: 0.0 standard drinks of alcohol     Comment: rare   Drug use: No   Sexual activity: Not Currently  Other Topics Concern   Not on file  Social History Narrative   Lives at home with wife in a one story home.  Has no children.  Does not work.  Getting workman's comp.  Education: 4 years trade school.    Social Drivers of Corporate investment banker Strain: Low Risk  (02/06/2022)   Overall Financial Resource Strain (CARDIA)    Difficulty of Paying Living Expenses: Not very hard  Food Insecurity: No Food Insecurity (10/09/2023)   Hunger Vital Sign    Worried About Running Out of Food in the Last Year: Never true    Ran Out of Food in the Last Year: Never true  Transportation Needs: No Transportation Needs (10/09/2023)   PRAPARE - Administrator, Civil Service (Medical): No    Lack of Transportation (Non-Medical): No  Physical Activity: Not on file  Stress: Not on file  Social Connections: Moderately Integrated (10/09/2023)   Social Connection and Isolation Panel    Frequency of Communication with Friends and Family: More than three times a week    Frequency of Social Gatherings with Friends and Family:  More than three times a week    Attends Religious Services: 1 to 4 times per year    Active Member of Clubs or Organizations: No    Attends Banker Meetings: Never    Marital Status: Married  Catering manager Violence: Not At Risk (10/09/2023)   Humiliation, Afraid, Rape, and Kick questionnaire    Fear of Current or Ex-Partner: No    Emotionally Abused: No    Physically Abused: No    Sexually Abused: No    FH:  Family History  Problem Relation Age of Onset   Stroke Mother    Other Father        Deceased, car fell on him   Diabetes Sister    Hypertension Sister    Heart disease Maternal  Grandmother        Pacemaker, MI    Past Medical History:  Diagnosis Date   AICD (automatic cardioverter/defibrillator) present    Arthritis    AV block, 2nd degree 2015   [employer]. Jude Allure Quadra pulse generator [id number], model PM 3242   Back pain    Cardiomyopathy (HCC)    Nonischemic 45%.    CHF (congestive heart failure) (HCC)    Diabetes mellitus without complication (HCC)    Fracture of lumbar vertebra without spinal cord injury, sequela 10/09/2022   Gout    Hemochromatosis    History of kidney stones    Hypertension    Hypospadias [birth year]   born with   Nephrolithiasis    Pacemaker lead malfunction-elevated threshold RV lead 07/31/2014   Polycythemia vera(238.4)    Presence of permanent cardiac pacemaker     Current Outpatient Medications  Medication Sig Dispense Refill   acetaminophen  (TYLENOL ) 500 MG tablet Take 1,000 mg by mouth daily as needed for headache, mild pain (pain score 1-3) or moderate pain (pain score 4-6).     allopurinol  (ZYLOPRIM ) 300 MG tablet Take 1 tablet (300 mg total) by mouth daily. 90 tablet 1   budesonide  (PULMICORT ) 0.5 MG/2ML nebulizer solution Take 2 mLs (0.5 mg total) by nebulization 2 (two) times daily. (RINSE MOUTH AFTER USE) 360 mL 11   cholestyramine  (QUESTRAN ) 4 GM/DOSE powder Take 4 g by mouth daily as needed (diarrhea).     clotrimazole (LOTRIMIN) 1 % cream Apply 1 Application topically daily as needed (irritation).     dicyclomine  (BENTYL ) 20 MG tablet Take 20 mg by mouth 2 (two) times daily.     HYDROcodone -acetaminophen  (NORCO/VICODIN) 5-325 MG tablet Take 1 tablet by mouth every 6 (six) hours as needed for moderate pain (pain score 4-6). 15 tablet 0   insulin  degludec (TRESIBA FLEXTOUCH) 100 UNIT/ML SOPN FlexTouch Pen Inject 16 Units into the skin daily after breakfast.     metoprolol  succinate (TOPROL  XL) 25 MG 24 hr tablet Take 0.5 tablets (12.5 mg total) by mouth daily. 45 tablet 3   omeprazole  (PRILOSEC) 20 MG capsule Take 20  mg by mouth daily.     rosuvastatin  (CRESTOR ) 5 MG tablet Take 1 tablet (5 mg total) by mouth daily. 30 tablet 0   spironolactone  (ALDACTONE ) 25 MG tablet Take 0.5 tablets (12.5 mg total) by mouth daily. 30 tablet 0   tamsulosin  (FLOMAX ) 0.4 MG CAPS capsule Take 0.4 mg by mouth daily.     torsemide  (DEMADEX ) 20 MG tablet Take 20 mg by mouth daily.     oxybutynin  (DITROPAN  XL) 15 MG 24 hr tablet Take 15 mg by mouth in the morning. (Patient not taking: Reported  on 10/19/2023)     No current facility-administered medications for this encounter.    Vitals:   10/19/23 1153  BP: 115/69  Pulse: 93  SpO2: 93%  Weight: 106.4 kg (234 lb 9.6 oz)  Height: 5' 10 (1.778 m)   Wt Readings from Last 3 Encounters:  10/19/23 106.4 kg (234 lb 9.6 oz)  10/18/23 106.1 kg (234 lb)  10/12/23 106.5 kg (234 lb 12.6 oz)    PHYSICAL EXAM: General:   No resp difficulty Neck: supple. no JVD.  Cor: PMI nondisplaced. Regular rate & rhythm. No rubs, gallops or murmurs. R upper chest Barostim. L upper chest CRT-D  Lungs: clear Abdomen: soft, nontender, nondistended.  Extremities: no cyanosis, clubbing, rash, edema Neuro: alert & oriented x3   ASSESSMENT & PLAN: 1. Chronic systolic heart failure: Patient with borderline ACC/AHA stage D cardiomyopathy.  RHC 4/25 showed borderline index. He is not interested in LVAD at this time, suspect borderline candidate with his deconditioning. Amyloid workup negative - s/p Barostim 10/04/23 -NYHA III. Appears euvolemic . Continue torsemide  20 mg daily  - GDMT limited by hypotension  - Contnue Toprol  XL 12.5 mg daiy  - Continue spiro 12.5 mg daily - Barostim titration today. Barostim rep was here during the visit.  Device titrated up to 2ma  but developed stim. Will need to delay titration. Returned back to 1ma with resolution of symptoms. Attempt titration in another few weeks after additional healing.    2. Anemia - h/o anemia of chronic disease. Now with concern for  blood loss.  - Has follow up with Hematology  - Check CBC    3. CKD 3a - b/l 1.3-1.4 Check BMET  -  4. CHB: CRT-D in place, follows with EP    Follow up in 6 weeks.  Dezirea Mccollister NP-C  12:43 PM

## 2023-10-18 NOTE — Telephone Encounter (Signed)
 Called to confirm/remind patient of their appointment at the [facility] on 10/19/2023 12:00.   Appointment:   [x] Confirmed  [] Left mess   [] No answer/No voice mail  [] VM Full/unable to leave message  [] Phone not in service  Patient reminded to bring all medications and/or complete list.  Confirmed patient has transportation. Gave directions, instructed to utilize valet parking.

## 2023-10-19 ENCOUNTER — Ambulatory Visit (HOSPITAL_COMMUNITY): Payer: Self-pay | Admitting: Adult Health

## 2023-10-19 ENCOUNTER — Ambulatory Visit (HOSPITAL_COMMUNITY)
Admission: EM | Admit: 2023-10-19 | Discharge: 2023-10-19 | Disposition: A | Discharge status: Home / Self Care | Attending: Adult Health | Admitting: Adult Health

## 2023-10-19 ENCOUNTER — Encounter (HOSPITAL_COMMUNITY): Payer: Self-pay

## 2023-10-19 VITALS — BP 115/69 | HR 93 | Ht 70.0 in | Wt 234.6 lb

## 2023-10-19 DIAGNOSIS — I5042 Chronic combined systolic (congestive) and diastolic (congestive) heart failure: Secondary | ICD-10-CM

## 2023-10-19 DIAGNOSIS — D508 Other iron deficiency anemias: Secondary | ICD-10-CM | POA: Diagnosis not present

## 2023-10-19 DIAGNOSIS — Z95810 Presence of automatic (implantable) cardiac defibrillator: Secondary | ICD-10-CM | POA: Diagnosis not present

## 2023-10-19 DIAGNOSIS — I130 Hypertensive heart and chronic kidney disease with heart failure and stage 1 through stage 4 chronic kidney disease, or unspecified chronic kidney disease: Secondary | ICD-10-CM | POA: Insufficient documentation

## 2023-10-19 DIAGNOSIS — R0602 Shortness of breath: Secondary | ICD-10-CM | POA: Diagnosis not present

## 2023-10-19 DIAGNOSIS — D631 Anemia in chronic kidney disease: Secondary | ICD-10-CM | POA: Insufficient documentation

## 2023-10-19 DIAGNOSIS — Z950 Presence of cardiac pacemaker: Secondary | ICD-10-CM | POA: Diagnosis not present

## 2023-10-19 DIAGNOSIS — I5084 End stage heart failure: Secondary | ICD-10-CM | POA: Diagnosis not present

## 2023-10-19 DIAGNOSIS — R197 Diarrhea, unspecified: Secondary | ICD-10-CM | POA: Insufficient documentation

## 2023-10-19 DIAGNOSIS — I442 Atrioventricular block, complete: Secondary | ICD-10-CM | POA: Diagnosis not present

## 2023-10-19 DIAGNOSIS — N1831 Chronic kidney disease, stage 3a: Secondary | ICD-10-CM | POA: Diagnosis not present

## 2023-10-19 DIAGNOSIS — I5022 Chronic systolic (congestive) heart failure: Secondary | ICD-10-CM | POA: Insufficient documentation

## 2023-10-19 DIAGNOSIS — Z87891 Personal history of nicotine dependence: Secondary | ICD-10-CM | POA: Insufficient documentation

## 2023-10-19 DIAGNOSIS — I428 Other cardiomyopathies: Secondary | ICD-10-CM

## 2023-10-19 LAB — CBC
HCT: 32.4 % — ABNORMAL LOW (ref 39.0–52.0)
Hemoglobin: 10.6 g/dL — ABNORMAL LOW (ref 13.0–17.0)
MCH: 32.1 pg (ref 26.0–34.0)
MCHC: 32.7 g/dL (ref 30.0–36.0)
MCV: 98.2 fL (ref 80.0–100.0)
Platelets: 328 10*3/uL (ref 150–400)
RBC: 3.3 MIL/uL — ABNORMAL LOW (ref 4.22–5.81)
RDW: 19 % — ABNORMAL HIGH (ref 11.5–15.5)
WBC: 11 10*3/uL — ABNORMAL HIGH (ref 4.0–10.5)
nRBC: 0 % (ref 0.0–0.2)

## 2023-10-19 LAB — BASIC METABOLIC PANEL WITH GFR
Anion gap: 12 (ref 5–15)
BUN: 29 mg/dL — ABNORMAL HIGH (ref 8–23)
CO2: 19 mmol/L — ABNORMAL LOW (ref 22–32)
Calcium: 8.8 mg/dL — ABNORMAL LOW (ref 8.9–10.3)
Chloride: 107 mmol/L (ref 98–111)
Creatinine, Ser: 1.38 mg/dL — ABNORMAL HIGH (ref 0.61–1.24)
GFR, Estimated: 54 mL/min — ABNORMAL LOW (ref >60)
Glucose, Bld: 169 mg/dL — ABNORMAL HIGH (ref 70–99)
Potassium: 4.4 mmol/L (ref 3.5–5.1)
Sodium: 138 mmol/L (ref 135–145)

## 2023-10-19 NOTE — Patient Instructions (Signed)
 There has been no changes to your medications.  Labs done today, your results will be available in MyChart, we will contact you for abnormal readings.  Your physician recommends that you schedule a follow-up appointment in: 6 weeks.  If you have any questions or concerns before your next appointment please send us  a message through [place] or call our office at [phone].    TO LEAVE A MESSAGE FOR THE NURSE SELECT OPTION 2, PLEASE LEAVE A MESSAGE INCLUDING: YOUR NAME DATE OF BIRTH CALL BACK NUMBER REASON FOR CALL**this is important as we prioritize the call backs  YOU WILL RECEIVE A CALL BACK THE SAME DAY AS LONG AS YOU CALL BEFORE 4:00 PM  At the [facility], you and your health needs are our priority. As part of our continuing mission to provide you with exceptional heart care, we have created designated Provider Care Teams. These Care Teams include your primary Cardiologist (physician) and Advanced Practice Providers (APPs- Physician Assistants and Nurse Practitioners) who all work together to provide you with the care you need, when you need it.   You may see any of the following providers on your designated Care Team at your next follow up: Dr Jules Oar Dr Peder Bourdon Dr. Alwin Baars Dr. Arta Lark Amy Marijane Shoulders, NP Ruddy Corral, [place] [facility] [place], [place] Dennise Fitz, NP [place] Lee, NP Shawnee Dellen, NP Luster Salters, PharmD Bevely Brush, PharmD   Please be sure to bring in all your medications bottles to every appointment.    Thank you for choosing [facility]

## 2023-10-24 ENCOUNTER — Inpatient Hospital Stay (HOSPITAL_BASED_OUTPATIENT_CLINIC_OR_DEPARTMENT_OTHER): Admitting: Hematology & Oncology

## 2023-10-24 ENCOUNTER — Inpatient Hospital Stay

## 2023-10-24 ENCOUNTER — Encounter: Payer: Self-pay | Admitting: Hematology & Oncology

## 2023-10-24 ENCOUNTER — Inpatient Hospital Stay: Attending: Hematology & Oncology

## 2023-10-24 VITALS — BP 102/52 | HR 75 | Temp 97.8°F | Resp 20

## 2023-10-24 DIAGNOSIS — Z7982 Long term (current) use of aspirin: Secondary | ICD-10-CM | POA: Insufficient documentation

## 2023-10-24 DIAGNOSIS — D45 Polycythemia vera: Secondary | ICD-10-CM | POA: Diagnosis not present

## 2023-10-24 DIAGNOSIS — I509 Heart failure, unspecified: Secondary | ICD-10-CM | POA: Diagnosis not present

## 2023-10-24 DIAGNOSIS — R197 Diarrhea, unspecified: Secondary | ICD-10-CM | POA: Insufficient documentation

## 2023-10-24 DIAGNOSIS — I441 Atrioventricular block, second degree: Secondary | ICD-10-CM | POA: Diagnosis not present

## 2023-10-24 DIAGNOSIS — Z794 Long term (current) use of insulin: Secondary | ICD-10-CM

## 2023-10-24 DIAGNOSIS — I428 Other cardiomyopathies: Secondary | ICD-10-CM

## 2023-10-24 LAB — CMP (CANCER CENTER ONLY)
ALT: 7 U/L (ref 0–44)
AST: 16 U/L (ref 15–41)
Albumin: 3.9 g/dL (ref 3.5–5.0)
Alkaline Phosphatase: 69 U/L (ref 38–126)
Anion gap: 10 (ref 5–15)
BUN: 34 mg/dL — ABNORMAL HIGH (ref 8–23)
CO2: 19 mmol/L — ABNORMAL LOW (ref 22–32)
Calcium: 9 mg/dL (ref 8.9–10.3)
Chloride: 109 mmol/L (ref 98–111)
Creatinine: 1.31 mg/dL — ABNORMAL HIGH (ref 0.61–1.24)
GFR, Estimated: 58 mL/min — ABNORMAL LOW (ref >60)
Glucose, Bld: 143 mg/dL — ABNORMAL HIGH (ref 70–99)
Potassium: 4.6 mmol/L (ref 3.5–5.1)
Sodium: 138 mmol/L (ref 135–145)
Total Bilirubin: 0.5 mg/dL (ref 0.0–1.2)
Total Protein: 6.7 g/dL (ref 6.5–8.1)

## 2023-10-24 LAB — CBC WITH DIFFERENTIAL (CANCER CENTER ONLY)
Abs Immature Granulocytes: 0.1 10*3/uL — ABNORMAL HIGH (ref 0.00–0.07)
Basophils Absolute: 0.1 10*3/uL (ref 0.0–0.1)
Basophils Relative: 1 %
Eosinophils Absolute: 0 10*3/uL (ref 0.0–0.5)
Eosinophils Relative: 1 %
HCT: 30.3 % — ABNORMAL LOW (ref 39.0–52.0)
Hemoglobin: 9.7 g/dL — ABNORMAL LOW (ref 13.0–17.0)
Immature Granulocytes: 1 %
Lymphocytes Relative: 7 %
Lymphs Abs: 0.6 10*3/uL — ABNORMAL LOW (ref 0.7–4.0)
MCH: 31.3 pg (ref 26.0–34.0)
MCHC: 32 g/dL (ref 30.0–36.0)
MCV: 97.7 fL (ref 80.0–100.0)
Monocytes Absolute: 0.6 10*3/uL (ref 0.1–1.0)
Monocytes Relative: 7 %
Neutro Abs: 7.3 10*3/uL (ref 1.7–7.7)
Neutrophils Relative %: 83 %
Platelet Count: 300 10*3/uL (ref 150–400)
RBC: 3.1 MIL/uL — ABNORMAL LOW (ref 4.22–5.81)
RDW: 20.8 % — ABNORMAL HIGH (ref 11.5–15.5)
WBC Count: 8.5 10*3/uL (ref 4.0–10.5)
nRBC: 0 % (ref 0.0–0.2)

## 2023-10-24 LAB — LACTATE DEHYDROGENASE: LDH: 509 U/L — ABNORMAL HIGH (ref 98–192)

## 2023-10-24 LAB — IRON AND IRON BINDING CAPACITY (CC-WL,HP ONLY)
Iron: 94 ug/dL (ref 45–182)
Saturation Ratios: 38 % (ref 17.9–39.5)
TIBC: 245 ug/dL — ABNORMAL LOW (ref 250–450)
UIBC: 151 ug/dL (ref 117–376)

## 2023-10-24 LAB — FERRITIN: Ferritin: 1050 ng/mL — ABNORMAL HIGH (ref 24–336)

## 2023-10-24 LAB — RETICULOCYTES
Immature Retic Fract: 16.9 % — ABNORMAL HIGH (ref 2.3–15.9)
RBC.: 3.09 MIL/uL — ABNORMAL LOW (ref 4.22–5.81)
Retic Count, Absolute: 49.1 10*3/uL (ref 19.0–186.0)
Retic Ct Pct: 1.6 % (ref 0.4–3.1)

## 2023-10-24 MED ORDER — SODIUM CHLORIDE 0.9 % IV SOLN: Freq: Once | INTRAVENOUS | Status: AC

## 2023-10-24 NOTE — Progress Notes (Signed)
 Hematology and Oncology Follow Up Visit  William Villanueva [id number] Nov 21, 1950 73 y.o. 10/24/2023   Principle Diagnosis:  Polycythemia vera-JAK2 positive Heart block-Mobitz II  Current Therapy:   Hydrea  500 mg p.o.TID -dose changed on 07/04/2023  --DC on 08/03/2023 Aspirin  81 mg by mouth daily Phlebotomy to maintain hematocrit below 45% Jakafi  10 mg p.o. twice daily-start on 08/07/2023 -on hold starting 10/19/2023     Interim History:  [name] is back for followup.  He was in the hospital recently.  He has been having a lot of problems with diarrhea.  He is going to see Gastroenterology.  I would think that he may need to have a colonoscopy to see why he is having all of his diarrhea.  When he was in the hospital, he was quite anemic.  This is somewhat surprising given that he does have polycythemia.  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  We stopped his Jakafi .  I do think he is going to need to have a bone marrow biopsy done.  I talked that he and his wife about this.  I explained how this is done.  I explained that this is an outpatient procedure.  Radiology does this for us .  We will set the bone marrow biopsy of after July 4.  He does had bad congestive heart failure.  His last echocardiogram showed an ejection fraction of about 20%.  I think he had a BAROSTIM placed.  I am unsure if there are problems with this.  I know he has been followed by Cardiology for this.  He has had no fever.  He has had no bleeding.  He has had no cough.  He does have the chronic back issues.  Overall, I would have to say that his performance status is probably ECOG 2.     Medications:  Current Outpatient Medications:    acetaminophen  (TYLENOL ) 500 MG tablet, Take 1,000 mg by mouth daily as needed for headache, mild pain (pain score 1-3) or moderate pain (pain score 4-6)., Disp: , Rfl:    allopurinol  (ZYLOPRIM ) 300 MG tablet, Take 1 tablet (300 mg total) by  mouth daily., Disp: 90 tablet, Rfl: 1   budesonide  (PULMICORT ) 0.5 MG/2ML nebulizer solution, Take 2 mLs (0.5 mg total) by nebulization 2 (two) times daily. (RINSE MOUTH AFTER USE), Disp: 360 mL, Rfl: 11   cholestyramine  (QUESTRAN ) 4 GM/DOSE powder, Take 4 g by mouth daily as needed (diarrhea)., Disp: , Rfl:    clotrimazole (LOTRIMIN) 1 % cream, Apply 1 Application topically daily as needed (irritation)., Disp: , Rfl:    dicyclomine  (BENTYL ) 20 MG tablet, Take 20 mg by mouth 2 (two) times daily., Disp: , Rfl:    HYDROcodone -acetaminophen  (NORCO/VICODIN) 5-325 MG tablet, Take 1 tablet by mouth every 6 (six) hours as needed for moderate pain (pain score 4-6)., Disp: 15 tablet, Rfl: 0   insulin  degludec (TRESIBA FLEXTOUCH) 100 UNIT/ML SOPN FlexTouch Pen, Inject 16 Units into the skin daily after breakfast., Disp: , Rfl:    metoprolol  succinate (TOPROL  XL) 25 MG 24 hr tablet, Take 0.5 tablets (12.5 mg total) by mouth daily., Disp: 45 tablet, Rfl: 3   omeprazole  (PRILOSEC) 20 MG capsule, Take 20 mg by mouth daily., Disp: , Rfl:    oxybutynin  (DITROPAN  XL) 15 MG 24 hr tablet, Take 15 mg by mouth in the morning. (Patient not taking: Reported on 10/19/2023), Disp: , Rfl:    rosuvastatin  (CRESTOR ) 5 MG tablet, Take  1 tablet (5 mg total) by mouth daily., Disp: 30 tablet, Rfl: 0   spironolactone  (ALDACTONE ) 25 MG tablet, Take 0.5 tablets (12.5 mg total) by mouth daily., Disp: 30 tablet, Rfl: 0   tamsulosin  (FLOMAX ) 0.4 MG CAPS capsule, Take 0.4 mg by mouth daily., Disp: , Rfl:    torsemide  (DEMADEX ) 20 MG tablet, Take 20 mg by mouth daily., Disp: , Rfl:  No current facility-administered medications for this visit.  Facility-Administered Medications Ordered in Other Visits:    0.9 %  sodium chloride  infusion, , Intravenous, Once, Ivor Mars, MD, Last Rate: 500 mL/hr at 10/24/23 0929, New Bag at 10/24/23 0929  Allergies:  Allergies  Allergen Reactions   Advil [Ibuprofen] Itching   Keflex [Cephalexin]  Diarrhea and Other (See Comments)    Caused C-diff, also    Wellbutrin [Bupropion] Hives and Other (See Comments)   Prednisone  Itching and Other (See Comments)    Abdominal pain, also    Temazepam  Other (See Comments)    Dizziness    Trazodone  And Nefazodone Other (See Comments)    Dizziness   Entresto  [Sacubitril -Valsartan ] Other (See Comments)    Unknown   Prozac [Fluoxetine Hcl] Itching    Past Medical History, Surgical history, Social history, and Family History were reviewed and updated.  Review of Systems: Review of Systems  Constitutional: Negative.   HENT: Negative.    Eyes: Negative.   Respiratory:  Positive for shortness of breath.   Cardiovascular:  Positive for chest pain.  Gastrointestinal: Negative.   Genitourinary: Negative.   Musculoskeletal:  Positive for back pain.  Skin: Negative.   Neurological: Negative.   Endo/Heme/Allergies: Negative.   Psychiatric/Behavioral: Negative.      Physical Exam: Vital signs temperature 97.8.  Pulse 75.  Blood pressure 102/52.  Weight was not taken.   Physical Exam Vitals reviewed.  HENT:     Head: Normocephalic and atraumatic.   Eyes:     Pupils: Pupils are equal, round, and reactive to light.    Cardiovascular:     Rate and Rhythm: Normal rate and regular rhythm.     Heart sounds: Normal heart sounds.  Pulmonary:     Effort: Pulmonary effort is normal.     Breath sounds: Normal breath sounds.  Abdominal:     General: Bowel sounds are normal.     Palpations: Abdomen is soft.   Musculoskeletal:        General: No tenderness or deformity. Normal range of motion.     Cervical back: Normal range of motion.  Lymphadenopathy:     Cervical: No cervical adenopathy.   Skin:    General: Skin is warm and dry.     Findings: No erythema or rash.   Neurological:     Mental Status: He is alert and oriented to person, place, and time.   Psychiatric:        Behavior: Behavior normal.        Thought Content:  Thought content normal.        Judgment: Judgment normal.      Lab Results  Component Value Date   WBC 8.5 10/24/2023   HGB 9.7 (L) 10/24/2023   HCT 30.3 (L) 10/24/2023   MCV 97.7 10/24/2023   PLT 300 10/24/2023     Chemistry      Component Value Date/Time   NA 138 10/24/2023 0900   NA 140 03/02/2023 1304   NA 141 04/10/2017 0923   NA 140 04/05/2016 0739   K 4.6  10/24/2023 0900   K 3.5 04/10/2017 0923   K 4.2 04/05/2016 0739   CL 109 10/24/2023 0900   CL 108 04/10/2017 0923   CO2 19 (L) 10/24/2023 0900   CO2 24 04/10/2017 0923   CO2 20 (L) 04/05/2016 0739   BUN 34 (H) 10/24/2023 0900   BUN 29 (H) 03/02/2023 1304   BUN 12 04/10/2017 0923   BUN 14.7 04/05/2016 0739   CREATININE 1.31 (H) 10/24/2023 0900   CREATININE 1.1 04/10/2017 0923   CREATININE 1.1 04/05/2016 0739      Component Value Date/Time   CALCIUM  9.0 10/24/2023 0900   CALCIUM  8.4 04/10/2017 0923   CALCIUM  9.0 04/05/2016 0739   ALKPHOS 69 10/24/2023 0900   ALKPHOS 77 04/10/2017 0923   ALKPHOS 91 04/05/2016 0739   AST 16 10/24/2023 0900   AST 34 04/05/2016 0739   ALT 7 10/24/2023 0900   ALT 32 04/10/2017 0923   ALT 40 04/05/2016 0739   BILITOT 0.5 10/24/2023 0900   BILITOT 0.48 04/05/2016 0739      Impression and Plan:  Mr. Newberry is 73 year old gentleman with polycythemia vera.  Obviously, the bigger issue now is his heart.  He has congestive heart failure.  Cardiology is following him for this.  Hopefully, the Barostim will help.  Again, I do think we need to have a bone marrow biopsy.  Will set this up for about 3 weeks.  Of note, his last erythropoietin  level was 215.  I am surprised that it is so high.  As such, I am not sure if we would be able to utilize ESA.  I want to have to get him back after he has the bone marrow biopsy done.  Hopefully, Gastroenterology will be able to help with the diarrhea.   Ivor Mars, MD 6/18/202510:05 AM

## 2023-10-24 NOTE — Patient Instructions (Signed)

## 2023-10-25 ENCOUNTER — Other Ambulatory Visit: Payer: Self-pay

## 2023-10-26 ENCOUNTER — Other Ambulatory Visit: Payer: Self-pay | Admitting: Pharmacy Technician

## 2023-10-26 ENCOUNTER — Other Ambulatory Visit: Payer: Self-pay

## 2023-10-26 DIAGNOSIS — I509 Heart failure, unspecified: Secondary | ICD-10-CM | POA: Diagnosis not present

## 2023-10-26 DIAGNOSIS — N1831 Chronic kidney disease, stage 3a: Secondary | ICD-10-CM | POA: Diagnosis not present

## 2023-10-26 DIAGNOSIS — K219 Gastro-esophageal reflux disease without esophagitis: Secondary | ICD-10-CM | POA: Diagnosis not present

## 2023-10-26 DIAGNOSIS — I5042 Chronic combined systolic (congestive) and diastolic (congestive) heart failure: Secondary | ICD-10-CM | POA: Diagnosis not present

## 2023-10-26 DIAGNOSIS — E1122 Type 2 diabetes mellitus with diabetic chronic kidney disease: Secondary | ICD-10-CM | POA: Diagnosis not present

## 2023-10-26 DIAGNOSIS — I130 Hypertensive heart and chronic kidney disease with heart failure and stage 1 through stage 4 chronic kidney disease, or unspecified chronic kidney disease: Secondary | ICD-10-CM | POA: Diagnosis not present

## 2023-10-26 DIAGNOSIS — D45 Polycythemia vera: Secondary | ICD-10-CM | POA: Diagnosis not present

## 2023-10-26 DIAGNOSIS — R197 Diarrhea, unspecified: Secondary | ICD-10-CM | POA: Diagnosis not present

## 2023-10-26 DIAGNOSIS — M549 Dorsalgia, unspecified: Secondary | ICD-10-CM | POA: Diagnosis not present

## 2023-10-26 NOTE — Progress Notes (Signed)
 On 6/20: Dis-enrolling patient from Jakafi  program. Per chart, 6/18 MD is stopping meds and will no longer take them.  Confirmed with Rph Beth who also confirmed with clinic Rph Rebecca.

## 2023-10-28 DIAGNOSIS — I2699 Other pulmonary embolism without acute cor pulmonale: Secondary | ICD-10-CM | POA: Diagnosis not present

## 2023-10-28 DIAGNOSIS — I110 Hypertensive heart disease with heart failure: Secondary | ICD-10-CM | POA: Diagnosis not present

## 2023-10-28 DIAGNOSIS — R161 Splenomegaly, not elsewhere classified: Secondary | ICD-10-CM | POA: Diagnosis not present

## 2023-10-28 DIAGNOSIS — E872 Acidosis, unspecified: Secondary | ICD-10-CM | POA: Diagnosis not present

## 2023-10-28 DIAGNOSIS — Z743 Need for continuous supervision: Secondary | ICD-10-CM | POA: Diagnosis not present

## 2023-10-28 DIAGNOSIS — R918 Other nonspecific abnormal finding of lung field: Secondary | ICD-10-CM | POA: Diagnosis not present

## 2023-10-28 DIAGNOSIS — I517 Cardiomegaly: Secondary | ICD-10-CM | POA: Diagnosis not present

## 2023-10-28 DIAGNOSIS — R531 Weakness: Secondary | ICD-10-CM | POA: Diagnosis not present

## 2023-10-28 DIAGNOSIS — I509 Heart failure, unspecified: Secondary | ICD-10-CM | POA: Diagnosis not present

## 2023-10-28 DIAGNOSIS — I428 Other cardiomyopathies: Secondary | ICD-10-CM | POA: Diagnosis not present

## 2023-10-28 DIAGNOSIS — D45 Polycythemia vera: Secondary | ICD-10-CM | POA: Diagnosis not present

## 2023-10-28 DIAGNOSIS — E119 Type 2 diabetes mellitus without complications: Secondary | ICD-10-CM | POA: Diagnosis not present

## 2023-10-28 DIAGNOSIS — N183 Chronic kidney disease, stage 3 unspecified: Secondary | ICD-10-CM | POA: Diagnosis not present

## 2023-10-28 DIAGNOSIS — I9589 Other hypotension: Secondary | ICD-10-CM | POA: Diagnosis not present

## 2023-10-28 DIAGNOSIS — I82C13 Acute embolism and thrombosis of internal jugular vein, bilateral: Secondary | ICD-10-CM | POA: Diagnosis not present

## 2023-10-28 DIAGNOSIS — I472 Ventricular tachycardia, unspecified: Secondary | ICD-10-CM | POA: Diagnosis not present

## 2023-10-28 DIAGNOSIS — K219 Gastro-esophageal reflux disease without esophagitis: Secondary | ICD-10-CM | POA: Diagnosis not present

## 2023-10-28 DIAGNOSIS — K6389 Other specified diseases of intestine: Secondary | ICD-10-CM | POA: Diagnosis not present

## 2023-10-28 DIAGNOSIS — J984 Other disorders of lung: Secondary | ICD-10-CM | POA: Diagnosis not present

## 2023-10-28 DIAGNOSIS — G8191 Hemiplegia, unspecified affecting right dominant side: Secondary | ICD-10-CM | POA: Diagnosis not present

## 2023-10-28 DIAGNOSIS — Z20822 Contact with and (suspected) exposure to covid-19: Secondary | ICD-10-CM | POA: Diagnosis not present

## 2023-10-28 DIAGNOSIS — K5900 Constipation, unspecified: Secondary | ICD-10-CM | POA: Diagnosis not present

## 2023-10-28 DIAGNOSIS — E8721 Acute metabolic acidosis: Secondary | ICD-10-CM | POA: Diagnosis not present

## 2023-10-28 DIAGNOSIS — R590 Localized enlarged lymph nodes: Secondary | ICD-10-CM | POA: Diagnosis not present

## 2023-10-28 DIAGNOSIS — I10 Essential (primary) hypertension: Secondary | ICD-10-CM | POA: Diagnosis not present

## 2023-10-28 DIAGNOSIS — J811 Chronic pulmonary edema: Secondary | ICD-10-CM | POA: Diagnosis not present

## 2023-10-28 DIAGNOSIS — I2722 Pulmonary hypertension due to left heart disease: Secondary | ICD-10-CM | POA: Diagnosis not present

## 2023-10-28 DIAGNOSIS — Z7901 Long term (current) use of anticoagulants: Secondary | ICD-10-CM | POA: Diagnosis not present

## 2023-10-28 DIAGNOSIS — I21A1 Myocardial infarction type 2: Secondary | ICD-10-CM | POA: Diagnosis not present

## 2023-10-28 DIAGNOSIS — R69 Illness, unspecified: Secondary | ICD-10-CM | POA: Diagnosis not present

## 2023-10-28 DIAGNOSIS — E878 Other disorders of electrolyte and fluid balance, not elsewhere classified: Secondary | ICD-10-CM | POA: Diagnosis not present

## 2023-10-28 DIAGNOSIS — I5020 Unspecified systolic (congestive) heart failure: Secondary | ICD-10-CM | POA: Diagnosis not present

## 2023-10-28 DIAGNOSIS — K5730 Diverticulosis of large intestine without perforation or abscess without bleeding: Secondary | ICD-10-CM | POA: Diagnosis not present

## 2023-10-28 DIAGNOSIS — I639 Cerebral infarction, unspecified: Secondary | ICD-10-CM | POA: Diagnosis not present

## 2023-10-28 DIAGNOSIS — I6523 Occlusion and stenosis of bilateral carotid arteries: Secondary | ICD-10-CM | POA: Diagnosis not present

## 2023-10-28 DIAGNOSIS — R1084 Generalized abdominal pain: Secondary | ICD-10-CM | POA: Diagnosis not present

## 2023-10-28 DIAGNOSIS — I82C12 Acute embolism and thrombosis of left internal jugular vein: Secondary | ICD-10-CM | POA: Diagnosis not present

## 2023-10-28 DIAGNOSIS — I82621 Acute embolism and thrombosis of deep veins of right upper extremity: Secondary | ICD-10-CM | POA: Diagnosis not present

## 2023-10-28 DIAGNOSIS — Z95810 Presence of automatic (implantable) cardiac defibrillator: Secondary | ICD-10-CM | POA: Diagnosis not present

## 2023-10-28 DIAGNOSIS — K8689 Other specified diseases of pancreas: Secondary | ICD-10-CM | POA: Diagnosis not present

## 2023-10-28 DIAGNOSIS — D649 Anemia, unspecified: Secondary | ICD-10-CM | POA: Diagnosis not present

## 2023-10-28 DIAGNOSIS — N281 Cyst of kidney, acquired: Secondary | ICD-10-CM | POA: Diagnosis not present

## 2023-10-28 DIAGNOSIS — I5022 Chronic systolic (congestive) heart failure: Secondary | ICD-10-CM | POA: Diagnosis not present

## 2023-10-28 DIAGNOSIS — R197 Diarrhea, unspecified: Secondary | ICD-10-CM | POA: Diagnosis not present

## 2023-10-28 DIAGNOSIS — E1122 Type 2 diabetes mellitus with diabetic chronic kidney disease: Secondary | ICD-10-CM | POA: Diagnosis not present

## 2023-10-28 DIAGNOSIS — I499 Cardiac arrhythmia, unspecified: Secondary | ICD-10-CM | POA: Diagnosis not present

## 2023-10-28 DIAGNOSIS — E871 Hypo-osmolality and hyponatremia: Secondary | ICD-10-CM | POA: Diagnosis not present

## 2023-10-28 DIAGNOSIS — R0989 Other specified symptoms and signs involving the circulatory and respiratory systems: Secondary | ICD-10-CM | POA: Diagnosis not present

## 2023-10-28 DIAGNOSIS — R2990 Unspecified symptoms and signs involving the nervous system: Secondary | ICD-10-CM | POA: Diagnosis not present

## 2023-10-28 DIAGNOSIS — R0689 Other abnormalities of breathing: Secondary | ICD-10-CM | POA: Diagnosis not present

## 2023-10-28 DIAGNOSIS — D735 Infarction of spleen: Secondary | ICD-10-CM | POA: Diagnosis not present

## 2023-10-28 DIAGNOSIS — D631 Anemia in chronic kidney disease: Secondary | ICD-10-CM | POA: Diagnosis not present

## 2023-10-28 DIAGNOSIS — Z7409 Other reduced mobility: Secondary | ICD-10-CM | POA: Diagnosis not present

## 2023-10-28 DIAGNOSIS — N1831 Chronic kidney disease, stage 3a: Secondary | ICD-10-CM | POA: Diagnosis not present

## 2023-10-28 DIAGNOSIS — I130 Hypertensive heart and chronic kidney disease with heart failure and stage 1 through stage 4 chronic kidney disease, or unspecified chronic kidney disease: Secondary | ICD-10-CM | POA: Diagnosis not present

## 2023-10-28 DIAGNOSIS — R29818 Other symptoms and signs involving the nervous system: Secondary | ICD-10-CM | POA: Diagnosis not present

## 2023-10-28 DIAGNOSIS — N179 Acute kidney failure, unspecified: Secondary | ICD-10-CM | POA: Diagnosis not present

## 2023-10-28 DIAGNOSIS — Z79899 Other long term (current) drug therapy: Secondary | ICD-10-CM | POA: Diagnosis not present

## 2023-10-28 DIAGNOSIS — R9082 White matter disease, unspecified: Secondary | ICD-10-CM | POA: Diagnosis not present

## 2023-10-28 DIAGNOSIS — I82612 Acute embolism and thrombosis of superficial veins of left upper extremity: Secondary | ICD-10-CM | POA: Diagnosis not present

## 2023-10-28 DIAGNOSIS — S0990XA Unspecified injury of head, initial encounter: Secondary | ICD-10-CM | POA: Diagnosis not present

## 2023-10-28 DIAGNOSIS — Z794 Long term (current) use of insulin: Secondary | ICD-10-CM | POA: Diagnosis not present

## 2023-10-28 DIAGNOSIS — Z0389 Encounter for observation for other suspected diseases and conditions ruled out: Secondary | ICD-10-CM | POA: Diagnosis not present

## 2023-10-28 DIAGNOSIS — R140 Abdominal distension (gaseous): Secondary | ICD-10-CM | POA: Diagnosis not present

## 2023-10-28 DIAGNOSIS — J440 Chronic obstructive pulmonary disease with acute lower respiratory infection: Secondary | ICD-10-CM | POA: Diagnosis not present

## 2023-10-28 DIAGNOSIS — I442 Atrioventricular block, complete: Secondary | ICD-10-CM | POA: Diagnosis not present

## 2023-10-28 DIAGNOSIS — R6889 Other general symptoms and signs: Secondary | ICD-10-CM | POA: Diagnosis not present

## 2023-10-28 DIAGNOSIS — R0602 Shortness of breath: Secondary | ICD-10-CM | POA: Diagnosis not present

## 2023-10-28 DIAGNOSIS — E785 Hyperlipidemia, unspecified: Secondary | ICD-10-CM | POA: Diagnosis not present

## 2023-10-29 DIAGNOSIS — R69 Illness, unspecified: Secondary | ICD-10-CM | POA: Diagnosis not present

## 2023-10-29 DIAGNOSIS — R140 Abdominal distension (gaseous): Secondary | ICD-10-CM | POA: Diagnosis not present

## 2023-10-29 DIAGNOSIS — K6389 Other specified diseases of intestine: Secondary | ICD-10-CM | POA: Diagnosis not present

## 2023-10-29 DIAGNOSIS — R0602 Shortness of breath: Secondary | ICD-10-CM | POA: Diagnosis not present

## 2023-10-30 ENCOUNTER — Ambulatory Visit (INDEPENDENT_AMBULATORY_CARE_PROVIDER_SITE_OTHER): Payer: Medicare Other

## 2023-10-30 DIAGNOSIS — R1084 Generalized abdominal pain: Secondary | ICD-10-CM | POA: Diagnosis not present

## 2023-10-30 DIAGNOSIS — I509 Heart failure, unspecified: Secondary | ICD-10-CM | POA: Diagnosis not present

## 2023-10-30 DIAGNOSIS — N179 Acute kidney failure, unspecified: Secondary | ICD-10-CM | POA: Diagnosis not present

## 2023-10-30 DIAGNOSIS — I2699 Other pulmonary embolism without acute cor pulmonale: Secondary | ICD-10-CM | POA: Diagnosis not present

## 2023-10-30 DIAGNOSIS — K6389 Other specified diseases of intestine: Secondary | ICD-10-CM | POA: Diagnosis not present

## 2023-10-30 DIAGNOSIS — I5020 Unspecified systolic (congestive) heart failure: Secondary | ICD-10-CM | POA: Diagnosis not present

## 2023-10-30 DIAGNOSIS — I428 Other cardiomyopathies: Secondary | ICD-10-CM

## 2023-10-30 DIAGNOSIS — E119 Type 2 diabetes mellitus without complications: Secondary | ICD-10-CM | POA: Diagnosis not present

## 2023-10-30 DIAGNOSIS — E785 Hyperlipidemia, unspecified: Secondary | ICD-10-CM | POA: Diagnosis not present

## 2023-10-30 DIAGNOSIS — E878 Other disorders of electrolyte and fluid balance, not elsewhere classified: Secondary | ICD-10-CM | POA: Diagnosis not present

## 2023-10-30 DIAGNOSIS — I110 Hypertensive heart disease with heart failure: Secondary | ICD-10-CM | POA: Diagnosis not present

## 2023-10-30 DIAGNOSIS — N281 Cyst of kidney, acquired: Secondary | ICD-10-CM | POA: Diagnosis not present

## 2023-10-30 DIAGNOSIS — R161 Splenomegaly, not elsewhere classified: Secondary | ICD-10-CM | POA: Diagnosis not present

## 2023-10-30 DIAGNOSIS — D735 Infarction of spleen: Secondary | ICD-10-CM | POA: Diagnosis not present

## 2023-10-30 DIAGNOSIS — Z79899 Other long term (current) drug therapy: Secondary | ICD-10-CM | POA: Diagnosis not present

## 2023-10-30 DIAGNOSIS — K219 Gastro-esophageal reflux disease without esophagitis: Secondary | ICD-10-CM | POA: Diagnosis not present

## 2023-10-30 DIAGNOSIS — K5900 Constipation, unspecified: Secondary | ICD-10-CM | POA: Diagnosis not present

## 2023-10-30 DIAGNOSIS — K5730 Diverticulosis of large intestine without perforation or abscess without bleeding: Secondary | ICD-10-CM | POA: Diagnosis not present

## 2023-10-31 DIAGNOSIS — Z79899 Other long term (current) drug therapy: Secondary | ICD-10-CM | POA: Diagnosis not present

## 2023-10-31 DIAGNOSIS — E785 Hyperlipidemia, unspecified: Secondary | ICD-10-CM | POA: Diagnosis not present

## 2023-10-31 DIAGNOSIS — K219 Gastro-esophageal reflux disease without esophagitis: Secondary | ICD-10-CM | POA: Diagnosis not present

## 2023-10-31 DIAGNOSIS — I5020 Unspecified systolic (congestive) heart failure: Secondary | ICD-10-CM | POA: Diagnosis not present

## 2023-10-31 DIAGNOSIS — I9589 Other hypotension: Secondary | ICD-10-CM | POA: Diagnosis not present

## 2023-10-31 DIAGNOSIS — K5900 Constipation, unspecified: Secondary | ICD-10-CM | POA: Diagnosis not present

## 2023-10-31 DIAGNOSIS — N183 Chronic kidney disease, stage 3 unspecified: Secondary | ICD-10-CM | POA: Diagnosis not present

## 2023-10-31 DIAGNOSIS — N179 Acute kidney failure, unspecified: Secondary | ICD-10-CM | POA: Diagnosis not present

## 2023-10-31 DIAGNOSIS — I472 Ventricular tachycardia, unspecified: Secondary | ICD-10-CM | POA: Diagnosis not present

## 2023-10-31 DIAGNOSIS — E878 Other disorders of electrolyte and fluid balance, not elsewhere classified: Secondary | ICD-10-CM | POA: Diagnosis not present

## 2023-10-31 DIAGNOSIS — I2699 Other pulmonary embolism without acute cor pulmonale: Secondary | ICD-10-CM | POA: Diagnosis not present

## 2023-10-31 DIAGNOSIS — I499 Cardiac arrhythmia, unspecified: Secondary | ICD-10-CM | POA: Diagnosis not present

## 2023-10-31 DIAGNOSIS — E8721 Acute metabolic acidosis: Secondary | ICD-10-CM | POA: Diagnosis not present

## 2023-10-31 DIAGNOSIS — E119 Type 2 diabetes mellitus without complications: Secondary | ICD-10-CM | POA: Diagnosis not present

## 2023-10-31 DIAGNOSIS — I110 Hypertensive heart disease with heart failure: Secondary | ICD-10-CM | POA: Diagnosis not present

## 2023-10-31 DIAGNOSIS — D735 Infarction of spleen: Secondary | ICD-10-CM | POA: Diagnosis not present

## 2023-10-31 DIAGNOSIS — D649 Anemia, unspecified: Secondary | ICD-10-CM | POA: Diagnosis not present

## 2023-10-31 LAB — CUP PACEART REMOTE DEVICE CHECK
Battery Remaining Longevity: 60 mo
Battery Remaining Percentage: 75 %
Battery Voltage: 2.98 V
Brady Statistic AP VP Percent: 1.8 %
Brady Statistic AP VS Percent: 1 %
Brady Statistic AS VP Percent: 95 %
Brady Statistic AS VS Percent: 1.8 %
Brady Statistic RA Percent Paced: 1 %
Date Time Interrogation Session: 20250624192511
HighPow Impedance: 73 Ohm
HighPow Impedance: 73 Ohm
Implantable Lead Connection Status: 753985
Implantable Lead Connection Status: 753985
Implantable Lead Connection Status: 753985
Implantable Lead Implant Date: 20151223
Implantable Lead Implant Date: 20151223
Implantable Lead Implant Date: 20231222
Implantable Lead Location: 753858
Implantable Lead Location: 753859
Implantable Lead Location: 753860
Implantable Lead Model: 7122
Implantable Pulse Generator Implant Date: 20231222
Lead Channel Impedance Value: 410 Ohm
Lead Channel Impedance Value: 410 Ohm
Lead Channel Impedance Value: 580 Ohm
Lead Channel Pacing Threshold Amplitude: 0.625 V
Lead Channel Pacing Threshold Amplitude: 0.75 V
Lead Channel Pacing Threshold Amplitude: 1.25 V
Lead Channel Pacing Threshold Pulse Width: 0.5 ms
Lead Channel Pacing Threshold Pulse Width: 0.7 ms
Lead Channel Pacing Threshold Pulse Width: 1 ms
Lead Channel Sensing Intrinsic Amplitude: 12 mV
Lead Channel Sensing Intrinsic Amplitude: 2.1 mV
Lead Channel Setting Pacing Amplitude: 2 V
Lead Channel Setting Pacing Amplitude: 2 V
Lead Channel Setting Pacing Amplitude: 2 V
Lead Channel Setting Pacing Pulse Width: 0.5 ms
Lead Channel Setting Pacing Pulse Width: 0.7 ms
Lead Channel Setting Sensing Sensitivity: 0.5 mV
Pulse Gen Serial Number: 5555736

## 2023-11-01 DIAGNOSIS — N183 Chronic kidney disease, stage 3 unspecified: Secondary | ICD-10-CM | POA: Diagnosis not present

## 2023-11-01 DIAGNOSIS — I110 Hypertensive heart disease with heart failure: Secondary | ICD-10-CM | POA: Diagnosis not present

## 2023-11-01 DIAGNOSIS — I9589 Other hypotension: Secondary | ICD-10-CM | POA: Diagnosis not present

## 2023-11-01 DIAGNOSIS — Z79899 Other long term (current) drug therapy: Secondary | ICD-10-CM | POA: Diagnosis not present

## 2023-11-01 DIAGNOSIS — E785 Hyperlipidemia, unspecified: Secondary | ICD-10-CM | POA: Diagnosis not present

## 2023-11-01 DIAGNOSIS — I472 Ventricular tachycardia, unspecified: Secondary | ICD-10-CM | POA: Diagnosis not present

## 2023-11-01 DIAGNOSIS — D735 Infarction of spleen: Secondary | ICD-10-CM | POA: Diagnosis not present

## 2023-11-01 DIAGNOSIS — E119 Type 2 diabetes mellitus without complications: Secondary | ICD-10-CM | POA: Diagnosis not present

## 2023-11-01 DIAGNOSIS — D649 Anemia, unspecified: Secondary | ICD-10-CM | POA: Diagnosis not present

## 2023-11-01 DIAGNOSIS — I2699 Other pulmonary embolism without acute cor pulmonale: Secondary | ICD-10-CM | POA: Diagnosis not present

## 2023-11-01 DIAGNOSIS — I5020 Unspecified systolic (congestive) heart failure: Secondary | ICD-10-CM | POA: Diagnosis not present

## 2023-11-01 DIAGNOSIS — K219 Gastro-esophageal reflux disease without esophagitis: Secondary | ICD-10-CM | POA: Diagnosis not present

## 2023-11-01 DIAGNOSIS — N179 Acute kidney failure, unspecified: Secondary | ICD-10-CM | POA: Diagnosis not present

## 2023-11-02 DIAGNOSIS — I110 Hypertensive heart disease with heart failure: Secondary | ICD-10-CM | POA: Diagnosis not present

## 2023-11-02 DIAGNOSIS — S0990XA Unspecified injury of head, initial encounter: Secondary | ICD-10-CM | POA: Diagnosis not present

## 2023-11-02 DIAGNOSIS — I2699 Other pulmonary embolism without acute cor pulmonale: Secondary | ICD-10-CM | POA: Diagnosis not present

## 2023-11-02 DIAGNOSIS — N179 Acute kidney failure, unspecified: Secondary | ICD-10-CM | POA: Diagnosis not present

## 2023-11-02 DIAGNOSIS — N281 Cyst of kidney, acquired: Secondary | ICD-10-CM | POA: Diagnosis not present

## 2023-11-02 DIAGNOSIS — D735 Infarction of spleen: Secondary | ICD-10-CM | POA: Diagnosis not present

## 2023-11-03 DIAGNOSIS — K219 Gastro-esophageal reflux disease without esophagitis: Secondary | ICD-10-CM | POA: Diagnosis not present

## 2023-11-03 DIAGNOSIS — D735 Infarction of spleen: Secondary | ICD-10-CM | POA: Diagnosis not present

## 2023-11-03 DIAGNOSIS — N179 Acute kidney failure, unspecified: Secondary | ICD-10-CM | POA: Diagnosis not present

## 2023-11-03 DIAGNOSIS — E119 Type 2 diabetes mellitus without complications: Secondary | ICD-10-CM | POA: Diagnosis not present

## 2023-11-03 DIAGNOSIS — E785 Hyperlipidemia, unspecified: Secondary | ICD-10-CM | POA: Diagnosis not present

## 2023-11-03 DIAGNOSIS — Z79899 Other long term (current) drug therapy: Secondary | ICD-10-CM | POA: Diagnosis not present

## 2023-11-03 DIAGNOSIS — I442 Atrioventricular block, complete: Secondary | ICD-10-CM | POA: Diagnosis not present

## 2023-11-03 DIAGNOSIS — I110 Hypertensive heart disease with heart failure: Secondary | ICD-10-CM | POA: Diagnosis not present

## 2023-11-03 DIAGNOSIS — I2699 Other pulmonary embolism without acute cor pulmonale: Secondary | ICD-10-CM | POA: Diagnosis not present

## 2023-11-03 DIAGNOSIS — Z95810 Presence of automatic (implantable) cardiac defibrillator: Secondary | ICD-10-CM | POA: Diagnosis not present

## 2023-11-03 DIAGNOSIS — I472 Ventricular tachycardia, unspecified: Secondary | ICD-10-CM | POA: Diagnosis not present

## 2023-11-03 DIAGNOSIS — I428 Other cardiomyopathies: Secondary | ICD-10-CM | POA: Diagnosis not present

## 2023-11-03 DIAGNOSIS — D649 Anemia, unspecified: Secondary | ICD-10-CM | POA: Diagnosis not present

## 2023-11-03 DIAGNOSIS — I21A1 Myocardial infarction type 2: Secondary | ICD-10-CM | POA: Diagnosis not present

## 2023-11-03 DIAGNOSIS — I5020 Unspecified systolic (congestive) heart failure: Secondary | ICD-10-CM | POA: Diagnosis not present

## 2023-11-04 DIAGNOSIS — D735 Infarction of spleen: Secondary | ICD-10-CM | POA: Diagnosis not present

## 2023-11-04 DIAGNOSIS — E785 Hyperlipidemia, unspecified: Secondary | ICD-10-CM | POA: Diagnosis not present

## 2023-11-04 DIAGNOSIS — E872 Acidosis, unspecified: Secondary | ICD-10-CM | POA: Diagnosis not present

## 2023-11-04 DIAGNOSIS — I2699 Other pulmonary embolism without acute cor pulmonale: Secondary | ICD-10-CM | POA: Diagnosis not present

## 2023-11-04 DIAGNOSIS — E119 Type 2 diabetes mellitus without complications: Secondary | ICD-10-CM | POA: Diagnosis not present

## 2023-11-04 DIAGNOSIS — K219 Gastro-esophageal reflux disease without esophagitis: Secondary | ICD-10-CM | POA: Diagnosis not present

## 2023-11-04 DIAGNOSIS — I10 Essential (primary) hypertension: Secondary | ICD-10-CM | POA: Diagnosis not present

## 2023-11-04 DIAGNOSIS — N179 Acute kidney failure, unspecified: Secondary | ICD-10-CM | POA: Diagnosis not present

## 2023-11-05 DIAGNOSIS — N179 Acute kidney failure, unspecified: Secondary | ICD-10-CM | POA: Diagnosis not present

## 2023-11-05 DIAGNOSIS — I5020 Unspecified systolic (congestive) heart failure: Secondary | ICD-10-CM | POA: Diagnosis not present

## 2023-11-05 DIAGNOSIS — Z7901 Long term (current) use of anticoagulants: Secondary | ICD-10-CM | POA: Diagnosis not present

## 2023-11-05 DIAGNOSIS — R0989 Other specified symptoms and signs involving the circulatory and respiratory systems: Secondary | ICD-10-CM | POA: Diagnosis not present

## 2023-11-05 DIAGNOSIS — I110 Hypertensive heart disease with heart failure: Secondary | ICD-10-CM | POA: Diagnosis not present

## 2023-11-05 DIAGNOSIS — R29818 Other symptoms and signs involving the nervous system: Secondary | ICD-10-CM | POA: Diagnosis not present

## 2023-11-05 DIAGNOSIS — D735 Infarction of spleen: Secondary | ICD-10-CM | POA: Diagnosis not present

## 2023-11-05 DIAGNOSIS — J811 Chronic pulmonary edema: Secondary | ICD-10-CM | POA: Diagnosis not present

## 2023-11-05 DIAGNOSIS — R1084 Generalized abdominal pain: Secondary | ICD-10-CM | POA: Diagnosis not present

## 2023-11-05 DIAGNOSIS — I2699 Other pulmonary embolism without acute cor pulmonale: Secondary | ICD-10-CM | POA: Diagnosis not present

## 2023-11-05 DIAGNOSIS — E785 Hyperlipidemia, unspecified: Secondary | ICD-10-CM | POA: Diagnosis not present

## 2023-11-05 DIAGNOSIS — R918 Other nonspecific abnormal finding of lung field: Secondary | ICD-10-CM | POA: Diagnosis not present

## 2023-11-05 DIAGNOSIS — E119 Type 2 diabetes mellitus without complications: Secondary | ICD-10-CM | POA: Diagnosis not present

## 2023-11-05 DIAGNOSIS — R531 Weakness: Secondary | ICD-10-CM | POA: Diagnosis not present

## 2023-11-05 DIAGNOSIS — I517 Cardiomegaly: Secondary | ICD-10-CM | POA: Diagnosis not present

## 2023-11-06 DIAGNOSIS — R2990 Unspecified symptoms and signs involving the nervous system: Secondary | ICD-10-CM | POA: Diagnosis not present

## 2023-11-06 DIAGNOSIS — I509 Heart failure, unspecified: Secondary | ICD-10-CM | POA: Diagnosis not present

## 2023-11-07 ENCOUNTER — Ambulatory Visit: Payer: Self-pay | Admitting: Cardiovascular Disease

## 2023-11-07 DIAGNOSIS — I509 Heart failure, unspecified: Secondary | ICD-10-CM | POA: Diagnosis not present

## 2023-11-07 DIAGNOSIS — I82612 Acute embolism and thrombosis of superficial veins of left upper extremity: Secondary | ICD-10-CM | POA: Diagnosis not present

## 2023-11-07 DIAGNOSIS — L89893 Pressure ulcer of other site, stage 3: Secondary | ICD-10-CM | POA: Diagnosis not present

## 2023-11-07 DIAGNOSIS — R1312 Dysphagia, oropharyngeal phase: Secondary | ICD-10-CM | POA: Diagnosis not present

## 2023-11-07 DIAGNOSIS — I82C12 Acute embolism and thrombosis of left internal jugular vein: Secondary | ICD-10-CM | POA: Diagnosis not present

## 2023-11-07 DIAGNOSIS — I69351 Hemiplegia and hemiparesis following cerebral infarction affecting right dominant side: Secondary | ICD-10-CM | POA: Diagnosis not present

## 2023-11-07 DIAGNOSIS — I2699 Other pulmonary embolism without acute cor pulmonale: Secondary | ICD-10-CM | POA: Diagnosis not present

## 2023-11-07 DIAGNOSIS — D649 Anemia, unspecified: Secondary | ICD-10-CM | POA: Diagnosis not present

## 2023-11-07 DIAGNOSIS — E119 Type 2 diabetes mellitus without complications: Secondary | ICD-10-CM | POA: Diagnosis not present

## 2023-11-07 DIAGNOSIS — D735 Infarction of spleen: Secondary | ICD-10-CM | POA: Diagnosis not present

## 2023-11-07 DIAGNOSIS — R52 Pain, unspecified: Secondary | ICD-10-CM | POA: Diagnosis not present

## 2023-11-07 DIAGNOSIS — R0602 Shortness of breath: Secondary | ICD-10-CM | POA: Diagnosis not present

## 2023-11-07 DIAGNOSIS — R531 Weakness: Secondary | ICD-10-CM | POA: Diagnosis not present

## 2023-11-07 DIAGNOSIS — J9690 Respiratory failure, unspecified, unspecified whether with hypoxia or hypercapnia: Secondary | ICD-10-CM | POA: Diagnosis not present

## 2023-11-07 DIAGNOSIS — R161 Splenomegaly, not elsewhere classified: Secondary | ICD-10-CM | POA: Diagnosis not present

## 2023-11-07 DIAGNOSIS — R2689 Other abnormalities of gait and mobility: Secondary | ICD-10-CM | POA: Diagnosis not present

## 2023-11-07 DIAGNOSIS — R238 Other skin changes: Secondary | ICD-10-CM | POA: Diagnosis not present

## 2023-11-07 DIAGNOSIS — E54 Ascorbic acid deficiency: Secondary | ICD-10-CM | POA: Diagnosis not present

## 2023-11-07 DIAGNOSIS — N179 Acute kidney failure, unspecified: Secondary | ICD-10-CM | POA: Diagnosis not present

## 2023-11-07 DIAGNOSIS — D45 Polycythemia vera: Secondary | ICD-10-CM | POA: Diagnosis not present

## 2023-11-07 DIAGNOSIS — I63542 Cerebral infarction due to unspecified occlusion or stenosis of left cerebellar artery: Secondary | ICD-10-CM | POA: Diagnosis not present

## 2023-11-07 DIAGNOSIS — J90 Pleural effusion, not elsewhere classified: Secondary | ICD-10-CM | POA: Diagnosis not present

## 2023-11-07 DIAGNOSIS — M6281 Muscle weakness (generalized): Secondary | ICD-10-CM | POA: Diagnosis not present

## 2023-11-07 DIAGNOSIS — R2990 Unspecified symptoms and signs involving the nervous system: Secondary | ICD-10-CM | POA: Diagnosis not present

## 2023-11-08 DIAGNOSIS — K219 Gastro-esophageal reflux disease without esophagitis: Secondary | ICD-10-CM | POA: Diagnosis not present

## 2023-11-08 DIAGNOSIS — I82612 Acute embolism and thrombosis of superficial veins of left upper extremity: Secondary | ICD-10-CM | POA: Diagnosis not present

## 2023-11-08 DIAGNOSIS — I82C12 Acute embolism and thrombosis of left internal jugular vein: Secondary | ICD-10-CM | POA: Diagnosis not present

## 2023-11-08 DIAGNOSIS — N179 Acute kidney failure, unspecified: Secondary | ICD-10-CM | POA: Diagnosis not present

## 2023-11-08 DIAGNOSIS — I6930 Unspecified sequelae of cerebral infarction: Secondary | ICD-10-CM | POA: Diagnosis not present

## 2023-11-08 DIAGNOSIS — I69351 Hemiplegia and hemiparesis following cerebral infarction affecting right dominant side: Secondary | ICD-10-CM | POA: Diagnosis not present

## 2023-11-11 ENCOUNTER — Encounter: Payer: Self-pay | Admitting: Hematology & Oncology

## 2023-11-11 DIAGNOSIS — I469 Cardiac arrest, cause unspecified: Secondary | ICD-10-CM | POA: Diagnosis not present

## 2023-11-12 ENCOUNTER — Telehealth: Payer: Self-pay | Admitting: *Deleted

## 2023-11-12 NOTE — Telephone Encounter (Signed)
 FYI - Per patient's wife, patient deceased 11/14/2023. I cannot locate records to confirm to mark patient deceased as of yet. I will continue to look. Thank you.

## 2023-11-13 ENCOUNTER — Encounter: Payer: Self-pay | Admitting: Hematology & Oncology

## 2023-11-16 ENCOUNTER — Telehealth: Payer: Self-pay | Admitting: Cardiology

## 2023-11-16 ENCOUNTER — Ambulatory Visit (HOSPITAL_COMMUNITY)

## 2023-11-16 ENCOUNTER — Ambulatory Visit (HOSPITAL_COMMUNITY): Admission: RE | Admit: 2023-11-16 | Source: Ambulatory Visit

## 2023-11-16 NOTE — Telephone Encounter (Signed)
 Spoke with the patient's wife who wanted to let Dr. Pietro and Adrien know that her husband passed away on 11/28/2023. I expressed my condolences to her and her family and advised that I would make them aware.

## 2023-11-16 NOTE — Telephone Encounter (Signed)
 Wife Jethro) wants a call back from Dr. Pietro regarding this patient.

## 2023-11-20 ENCOUNTER — Encounter: Admitting: Student

## 2023-11-26 ENCOUNTER — Encounter

## 2023-11-26 NOTE — Telephone Encounter (Signed)
 Spoke with pt wife, aware to let us  know of anything they need.

## 2023-11-28 ENCOUNTER — Inpatient Hospital Stay: Attending: Hematology & Oncology

## 2023-11-28 ENCOUNTER — Ambulatory Visit: Admitting: Hematology & Oncology

## 2023-12-04 ENCOUNTER — Encounter (HOSPITAL_COMMUNITY): Admitting: Cardiology

## 2023-12-07 DEATH — deceased

## 2024-01-21 ENCOUNTER — Ambulatory Visit: Admitting: Internal Medicine

## 2024-01-30 NOTE — Progress Notes (Signed)
Remote ICD Transmission.
